# Patient Record
Sex: Female | Born: 1950 | Race: White | Hispanic: No | Smoking: Former smoker
Health system: Southern US, Community
[De-identification: ages and names within clinical notes are randomized; demographics above are authoritative.]

## PROBLEM LIST (undated history)

## (undated) DIAGNOSIS — I5042 Chronic combined systolic (congestive) and diastolic (congestive) heart failure: Secondary | ICD-10-CM

## (undated) DIAGNOSIS — K219 Gastro-esophageal reflux disease without esophagitis: Secondary | ICD-10-CM

## (undated) DIAGNOSIS — M1990 Unspecified osteoarthritis, unspecified site: Secondary | ICD-10-CM

## (undated) DIAGNOSIS — K7460 Unspecified cirrhosis of liver: Secondary | ICD-10-CM

## (undated) DIAGNOSIS — F329 Major depressive disorder, single episode, unspecified: Secondary | ICD-10-CM

## (undated) DIAGNOSIS — F32A Depression, unspecified: Secondary | ICD-10-CM

## (undated) DIAGNOSIS — E669 Obesity, unspecified: Secondary | ICD-10-CM

## (undated) DIAGNOSIS — H35039 Hypertensive retinopathy, unspecified eye: Secondary | ICD-10-CM

## (undated) DIAGNOSIS — I8500 Esophageal varices without bleeding: Secondary | ICD-10-CM

## (undated) DIAGNOSIS — I10 Essential (primary) hypertension: Secondary | ICD-10-CM

## (undated) DIAGNOSIS — E785 Hyperlipidemia, unspecified: Secondary | ICD-10-CM

## (undated) DIAGNOSIS — H269 Unspecified cataract: Secondary | ICD-10-CM

## (undated) DIAGNOSIS — Z8701 Personal history of pneumonia (recurrent): Secondary | ICD-10-CM

## (undated) DIAGNOSIS — I4819 Other persistent atrial fibrillation: Secondary | ICD-10-CM

## (undated) DIAGNOSIS — I2510 Atherosclerotic heart disease of native coronary artery without angina pectoris: Secondary | ICD-10-CM

## (undated) DIAGNOSIS — N183 Chronic kidney disease, stage 3 unspecified: Secondary | ICD-10-CM

## (undated) DIAGNOSIS — D696 Thrombocytopenia, unspecified: Secondary | ICD-10-CM

## (undated) DIAGNOSIS — I4892 Unspecified atrial flutter: Secondary | ICD-10-CM

## (undated) DIAGNOSIS — D61818 Other pancytopenia: Secondary | ICD-10-CM

## (undated) HISTORY — DX: Hypertensive retinopathy, unspecified eye: H35.039

## (undated) HISTORY — PX: ABDOMINAL HYSTERECTOMY: SHX81

## (undated) HISTORY — DX: Essential (primary) hypertension: I10

## (undated) HISTORY — DX: Unspecified cataract: H26.9

## (undated) HISTORY — PX: JOINT REPLACEMENT: SHX530

## (undated) HISTORY — DX: Unspecified atrial flutter: I48.92

## (undated) HISTORY — DX: Personal history of pneumonia (recurrent): Z87.01

## (undated) HISTORY — DX: Hyperlipidemia, unspecified: E78.5

## (undated) HISTORY — DX: Depression, unspecified: F32.A

## (undated) HISTORY — DX: Gastro-esophageal reflux disease without esophagitis: K21.9

## (undated) HISTORY — DX: Unspecified osteoarthritis, unspecified site: M19.90

## (undated) HISTORY — PX: BACK SURGERY: SHX140

## (undated) HISTORY — DX: Obesity, unspecified: E66.9

## (undated) HISTORY — DX: Esophageal varices without bleeding: I85.00

## (undated) HISTORY — DX: Major depressive disorder, single episode, unspecified: F32.9

## (undated) HISTORY — DX: Atherosclerotic heart disease of native coronary artery without angina pectoris: I25.10

## (undated) HISTORY — PX: TONSILLECTOMY: SUR1361

---

## 1997-12-10 ENCOUNTER — Ambulatory Visit (HOSPITAL_COMMUNITY): Admission: RE | Admit: 1997-12-10 | Discharge: 1997-12-10 | Payer: Self-pay | Admitting: Neurosurgery

## 1997-12-19 ENCOUNTER — Ambulatory Visit (HOSPITAL_COMMUNITY): Admission: RE | Admit: 1997-12-19 | Discharge: 1997-12-19 | Payer: Self-pay | Admitting: Neurosurgery

## 1997-12-19 ENCOUNTER — Encounter: Payer: Self-pay | Admitting: Neurosurgery

## 1998-02-13 ENCOUNTER — Encounter: Payer: Self-pay | Admitting: Neurosurgery

## 1998-02-17 ENCOUNTER — Encounter: Payer: Self-pay | Admitting: Neurosurgery

## 1998-02-17 ENCOUNTER — Inpatient Hospital Stay (HOSPITAL_COMMUNITY): Admission: RE | Admit: 1998-02-17 | Discharge: 1998-02-20 | Payer: Self-pay | Admitting: Neurosurgery

## 1998-08-30 ENCOUNTER — Inpatient Hospital Stay (HOSPITAL_COMMUNITY): Admission: AD | Admit: 1998-08-30 | Discharge: 1998-09-03 | Payer: Self-pay | Admitting: *Deleted

## 1998-10-04 ENCOUNTER — Inpatient Hospital Stay (HOSPITAL_COMMUNITY): Admission: EM | Admit: 1998-10-04 | Discharge: 1998-10-08 | Payer: Self-pay | Admitting: Cardiology

## 1998-10-13 ENCOUNTER — Ambulatory Visit (HOSPITAL_COMMUNITY): Admission: RE | Admit: 1998-10-13 | Discharge: 1998-10-13 | Payer: Self-pay | Admitting: Cardiology

## 1998-10-13 ENCOUNTER — Encounter: Payer: Self-pay | Admitting: Cardiology

## 1999-04-01 ENCOUNTER — Encounter: Payer: Self-pay | Admitting: Cardiology

## 1999-04-01 ENCOUNTER — Inpatient Hospital Stay (HOSPITAL_COMMUNITY): Admission: AD | Admit: 1999-04-01 | Discharge: 1999-04-03 | Payer: Self-pay | Admitting: Cardiology

## 1999-07-27 ENCOUNTER — Inpatient Hospital Stay (HOSPITAL_COMMUNITY): Admission: AD | Admit: 1999-07-27 | Discharge: 1999-08-06 | Payer: Self-pay | Admitting: Internal Medicine

## 1999-07-28 ENCOUNTER — Encounter: Payer: Self-pay | Admitting: Internal Medicine

## 1999-07-29 ENCOUNTER — Encounter: Payer: Self-pay | Admitting: Cardiothoracic Surgery

## 1999-07-29 HISTORY — PX: CORONARY ARTERY BYPASS GRAFT: SHX141

## 1999-07-30 ENCOUNTER — Encounter: Payer: Self-pay | Admitting: Cardiothoracic Surgery

## 1999-07-31 ENCOUNTER — Encounter: Payer: Self-pay | Admitting: Internal Medicine

## 1999-08-01 ENCOUNTER — Encounter: Payer: Self-pay | Admitting: Cardiothoracic Surgery

## 1999-08-02 ENCOUNTER — Encounter: Payer: Self-pay | Admitting: Cardiothoracic Surgery

## 1999-08-03 ENCOUNTER — Encounter: Payer: Self-pay | Admitting: Cardiothoracic Surgery

## 2000-01-22 ENCOUNTER — Ambulatory Visit (HOSPITAL_COMMUNITY): Admission: RE | Admit: 2000-01-22 | Discharge: 2000-01-22 | Payer: Self-pay | Admitting: Neurosurgery

## 2000-01-22 ENCOUNTER — Encounter: Payer: Self-pay | Admitting: Neurosurgery

## 2000-05-13 ENCOUNTER — Encounter: Payer: Self-pay | Admitting: Neurosurgery

## 2000-05-13 ENCOUNTER — Ambulatory Visit (HOSPITAL_COMMUNITY): Admission: RE | Admit: 2000-05-13 | Discharge: 2000-05-13 | Payer: Self-pay | Admitting: Neurosurgery

## 2000-08-02 ENCOUNTER — Encounter: Payer: Self-pay | Admitting: Orthopaedic Surgery

## 2000-08-08 ENCOUNTER — Inpatient Hospital Stay (HOSPITAL_COMMUNITY): Admission: RE | Admit: 2000-08-08 | Discharge: 2000-08-12 | Payer: Self-pay | Admitting: Orthopaedic Surgery

## 2000-08-08 ENCOUNTER — Encounter: Payer: Self-pay | Admitting: Orthopaedic Surgery

## 2002-02-28 HISTORY — PX: CARDIAC CATHETERIZATION: SHX172

## 2003-01-03 ENCOUNTER — Ambulatory Visit (HOSPITAL_COMMUNITY): Admission: RE | Admit: 2003-01-03 | Discharge: 2003-01-04 | Payer: Self-pay | Admitting: *Deleted

## 2003-12-31 ENCOUNTER — Ambulatory Visit: Payer: Self-pay | Admitting: *Deleted

## 2004-02-29 HISTORY — PX: CARDIAC CATHETERIZATION: SHX172

## 2004-07-30 ENCOUNTER — Ambulatory Visit: Payer: Self-pay | Admitting: *Deleted

## 2005-02-04 ENCOUNTER — Inpatient Hospital Stay (HOSPITAL_COMMUNITY): Admission: EM | Admit: 2005-02-04 | Discharge: 2005-02-08 | Payer: Self-pay | Admitting: Emergency Medicine

## 2005-02-07 ENCOUNTER — Ambulatory Visit: Payer: Self-pay | Admitting: Cardiology

## 2005-02-28 HISTORY — PX: OTHER SURGICAL HISTORY: SHX169

## 2005-03-09 ENCOUNTER — Ambulatory Visit: Payer: Self-pay | Admitting: Cardiology

## 2005-06-16 ENCOUNTER — Ambulatory Visit: Payer: Self-pay | Admitting: Cardiology

## 2005-06-29 ENCOUNTER — Encounter: Payer: Self-pay | Admitting: Neurosurgery

## 2005-07-27 ENCOUNTER — Ambulatory Visit: Payer: Self-pay

## 2006-02-28 HISTORY — PX: CARDIAC CATHETERIZATION: SHX172

## 2006-04-25 ENCOUNTER — Emergency Department (HOSPITAL_COMMUNITY): Admission: EM | Admit: 2006-04-25 | Discharge: 2006-04-25 | Payer: Self-pay | Admitting: Emergency Medicine

## 2006-06-06 ENCOUNTER — Ambulatory Visit: Payer: Self-pay | Admitting: Cardiology

## 2006-06-06 LAB — CONVERTED CEMR LAB
BUN: 13 mg/dL (ref 6–23)
Basophils Absolute: 0 10*3/uL (ref 0.0–0.1)
Basophils Relative: 0.4 % (ref 0.0–1.0)
CO2: 32 meq/L (ref 19–32)
Calcium: 9.6 mg/dL (ref 8.4–10.5)
Chloride: 106 meq/L (ref 96–112)
Creatinine, Ser: 0.9 mg/dL (ref 0.4–1.2)
Eosinophils Absolute: 0.4 10*3/uL (ref 0.0–0.6)
Eosinophils Relative: 6.3 % — ABNORMAL HIGH (ref 0.0–5.0)
GFR calc Af Amer: 83 mL/min
GFR calc non Af Amer: 69 mL/min
Glucose, Bld: 125 mg/dL — ABNORMAL HIGH (ref 70–99)
HCT: 40.3 % (ref 36.0–46.0)
Hemoglobin: 14.3 g/dL (ref 12.0–15.0)
INR: 1 (ref 0.9–2.0)
Lymphocytes Relative: 29.6 % (ref 12.0–46.0)
MCHC: 35.5 g/dL (ref 30.0–36.0)
MCV: 86.9 fL (ref 78.0–100.0)
Monocytes Absolute: 0.5 10*3/uL (ref 0.2–0.7)
Monocytes Relative: 8.4 % (ref 3.0–11.0)
Neutro Abs: 3.6 10*3/uL (ref 1.4–7.7)
Neutrophils Relative %: 55.3 % (ref 43.0–77.0)
Platelets: 124 10*3/uL — ABNORMAL LOW (ref 150–400)
Potassium: 5.1 meq/L (ref 3.5–5.1)
Prothrombin Time: 11.8 s (ref 10.0–14.0)
RBC: 4.64 M/uL (ref 3.87–5.11)
RDW: 13.8 % (ref 11.5–14.6)
Sodium: 144 meq/L (ref 135–145)
WBC: 6.4 10*3/uL (ref 4.5–10.5)
aPTT: 28.8 s (ref 26.5–36.5)

## 2006-06-08 ENCOUNTER — Inpatient Hospital Stay (HOSPITAL_BASED_OUTPATIENT_CLINIC_OR_DEPARTMENT_OTHER): Admission: RE | Admit: 2006-06-08 | Discharge: 2006-06-08 | Payer: Self-pay | Admitting: Cardiology

## 2006-06-08 ENCOUNTER — Ambulatory Visit: Payer: Self-pay | Admitting: Cardiology

## 2006-06-09 ENCOUNTER — Ambulatory Visit: Payer: Self-pay | Admitting: Cardiology

## 2006-06-20 ENCOUNTER — Ambulatory Visit: Payer: Self-pay

## 2006-06-20 ENCOUNTER — Encounter: Payer: Self-pay | Admitting: Cardiology

## 2006-07-04 ENCOUNTER — Ambulatory Visit: Payer: Self-pay | Admitting: Cardiology

## 2006-08-14 ENCOUNTER — Ambulatory Visit (HOSPITAL_COMMUNITY): Admission: RE | Admit: 2006-08-14 | Discharge: 2006-08-14 | Payer: Self-pay | Admitting: Neurosurgery

## 2006-09-27 ENCOUNTER — Ambulatory Visit: Payer: Self-pay | Admitting: Cardiology

## 2007-01-29 ENCOUNTER — Ambulatory Visit: Payer: Self-pay | Admitting: Cardiology

## 2007-01-29 LAB — CONVERTED CEMR LAB
BUN: 12 mg/dL (ref 6–23)
CO2: 34 meq/L — ABNORMAL HIGH (ref 19–32)
Calcium: 9.7 mg/dL (ref 8.4–10.5)
Chloride: 102 meq/L (ref 96–112)
Creatinine, Ser: 0.8 mg/dL (ref 0.4–1.2)
GFR calc Af Amer: 95 mL/min
GFR calc non Af Amer: 78 mL/min
Glucose, Bld: 93 mg/dL (ref 70–99)
Potassium: 4.5 meq/L (ref 3.5–5.1)
Sodium: 144 meq/L (ref 135–145)

## 2007-08-23 ENCOUNTER — Ambulatory Visit: Payer: Self-pay | Admitting: Cardiology

## 2008-01-30 ENCOUNTER — Ambulatory Visit: Payer: Self-pay | Admitting: Cardiology

## 2008-03-25 ENCOUNTER — Ambulatory Visit (HOSPITAL_COMMUNITY): Admission: RE | Admit: 2008-03-25 | Discharge: 2008-03-25 | Payer: Self-pay | Admitting: Pulmonary Disease

## 2008-04-02 ENCOUNTER — Ambulatory Visit (HOSPITAL_COMMUNITY): Admission: RE | Admit: 2008-04-02 | Discharge: 2008-04-02 | Payer: Self-pay | Admitting: Pulmonary Disease

## 2008-04-24 ENCOUNTER — Ambulatory Visit: Payer: Self-pay | Admitting: Cardiology

## 2008-04-24 ENCOUNTER — Encounter: Payer: Self-pay | Admitting: Cardiology

## 2008-04-25 ENCOUNTER — Inpatient Hospital Stay (HOSPITAL_COMMUNITY): Admission: EM | Admit: 2008-04-25 | Discharge: 2008-04-26 | Payer: Self-pay | Admitting: Emergency Medicine

## 2008-06-04 ENCOUNTER — Encounter: Payer: Self-pay | Admitting: Cardiology

## 2008-06-04 ENCOUNTER — Ambulatory Visit: Payer: Self-pay | Admitting: Cardiology

## 2008-06-04 DIAGNOSIS — I10 Essential (primary) hypertension: Secondary | ICD-10-CM | POA: Insufficient documentation

## 2008-06-04 DIAGNOSIS — E782 Mixed hyperlipidemia: Secondary | ICD-10-CM | POA: Insufficient documentation

## 2008-06-04 DIAGNOSIS — Z951 Presence of aortocoronary bypass graft: Secondary | ICD-10-CM | POA: Insufficient documentation

## 2008-06-09 ENCOUNTER — Inpatient Hospital Stay (HOSPITAL_BASED_OUTPATIENT_CLINIC_OR_DEPARTMENT_OTHER): Admission: RE | Admit: 2008-06-09 | Discharge: 2008-06-09 | Payer: Self-pay | Admitting: Cardiology

## 2008-06-09 ENCOUNTER — Ambulatory Visit: Payer: Self-pay | Admitting: Cardiology

## 2008-06-16 LAB — CONVERTED CEMR LAB
BUN: 9 mg/dL (ref 6–23)
Basophils Absolute: 0 10*3/uL (ref 0.0–0.1)
Basophils Relative: 0 % (ref 0.0–3.0)
CO2: 34 meq/L — ABNORMAL HIGH (ref 19–32)
Calcium: 9.5 mg/dL (ref 8.4–10.5)
Chloride: 107 meq/L (ref 96–112)
Creatinine, Ser: 0.8 mg/dL (ref 0.4–1.2)
Eosinophils Absolute: 0.2 10*3/uL (ref 0.0–0.7)
Eosinophils Relative: 3.1 % (ref 0.0–5.0)
GFR calc non Af Amer: 77.82 mL/min (ref >60)
Glucose, Bld: 106 mg/dL — ABNORMAL HIGH (ref 70–99)
HCT: 40.9 % (ref 36.0–46.0)
Hemoglobin: 13.9 g/dL (ref 12.0–15.0)
INR: 1.1 — ABNORMAL HIGH (ref 0.8–1.0)
Lymphocytes Relative: 33.4 % (ref 12.0–46.0)
Lymphs Abs: 1.9 10*3/uL (ref 0.7–4.0)
MCHC: 34 g/dL (ref 30.0–36.0)
MCV: 88.4 fL (ref 78.0–100.0)
Monocytes Absolute: 0.4 10*3/uL (ref 0.1–1.0)
Monocytes Relative: 7.1 % (ref 3.0–12.0)
Neutro Abs: 3.2 10*3/uL (ref 1.4–7.7)
Neutrophils Relative %: 56.4 % (ref 43.0–77.0)
Platelets: 103 10*3/uL — ABNORMAL LOW (ref 150.0–400.0)
Potassium: 4.9 meq/L (ref 3.5–5.1)
Prothrombin Time: 12 s (ref 10.9–13.3)
RBC: 4.63 M/uL (ref 3.87–5.11)
RDW: 13.3 % (ref 11.5–14.6)
Sodium: 145 meq/L (ref 135–145)
WBC: 5.7 10*3/uL (ref 4.5–10.5)

## 2008-06-23 ENCOUNTER — Ambulatory Visit: Payer: Self-pay | Admitting: Cardiology

## 2008-07-07 ENCOUNTER — Encounter: Payer: Self-pay | Admitting: Cardiology

## 2008-07-07 ENCOUNTER — Ambulatory Visit: Admission: RE | Admit: 2008-07-07 | Discharge: 2008-07-07 | Payer: Self-pay | Admitting: Cardiology

## 2008-07-14 ENCOUNTER — Ambulatory Visit: Payer: Self-pay | Admitting: Cardiology

## 2008-07-14 ENCOUNTER — Encounter (INDEPENDENT_AMBULATORY_CARE_PROVIDER_SITE_OTHER): Payer: Self-pay

## 2008-08-26 ENCOUNTER — Telehealth: Payer: Self-pay | Admitting: Cardiology

## 2008-08-26 ENCOUNTER — Encounter: Payer: Self-pay | Admitting: Cardiology

## 2008-09-04 ENCOUNTER — Ambulatory Visit: Payer: Self-pay | Admitting: Cardiology

## 2008-10-27 ENCOUNTER — Ambulatory Visit: Payer: Self-pay | Admitting: Cardiology

## 2008-10-30 ENCOUNTER — Ambulatory Visit: Payer: Self-pay

## 2008-10-30 ENCOUNTER — Encounter: Payer: Self-pay | Admitting: Cardiology

## 2008-11-03 LAB — CONVERTED CEMR LAB
BUN: 15 mg/dL (ref 6–23)
CO2: 31 meq/L (ref 19–32)
Calcium: 9.1 mg/dL (ref 8.4–10.5)
Chloride: 106 meq/L (ref 96–112)
Creatinine, Ser: 0.8 mg/dL (ref 0.4–1.2)
GFR calc non Af Amer: 77.72 mL/min (ref >60)
Glucose, Bld: 126 mg/dL — ABNORMAL HIGH (ref 70–99)
Potassium: 4.2 meq/L (ref 3.5–5.1)
Sodium: 141 meq/L (ref 135–145)

## 2008-12-27 ENCOUNTER — Encounter: Payer: Self-pay | Admitting: Cardiology

## 2008-12-28 ENCOUNTER — Ambulatory Visit: Payer: Self-pay | Admitting: Cardiology

## 2008-12-29 ENCOUNTER — Encounter: Payer: Self-pay | Admitting: Cardiology

## 2009-01-13 ENCOUNTER — Ambulatory Visit: Payer: Self-pay | Admitting: Cardiology

## 2009-01-27 ENCOUNTER — Ambulatory Visit: Payer: Self-pay | Admitting: Cardiology

## 2009-02-04 LAB — CONVERTED CEMR LAB
BUN: 11 mg/dL (ref 6–23)
CO2: 32 meq/L (ref 19–32)
Calcium: 9.5 mg/dL (ref 8.4–10.5)
Chloride: 102 meq/L (ref 96–112)
Creatinine, Ser: 0.9 mg/dL (ref 0.4–1.2)
GFR calc non Af Amer: 67.78 mL/min (ref >60)
Glucose, Bld: 183 mg/dL — ABNORMAL HIGH (ref 70–99)
Potassium: 4 meq/L (ref 3.5–5.1)
Sodium: 141 meq/L (ref 135–145)

## 2009-03-16 ENCOUNTER — Ambulatory Visit: Payer: Self-pay | Admitting: Cardiology

## 2009-05-06 ENCOUNTER — Encounter: Payer: Self-pay | Admitting: Cardiology

## 2009-05-11 ENCOUNTER — Ambulatory Visit (HOSPITAL_COMMUNITY): Admission: RE | Admit: 2009-05-11 | Discharge: 2009-05-11 | Payer: Self-pay | Admitting: Pulmonary Disease

## 2009-05-19 ENCOUNTER — Ambulatory Visit: Payer: Self-pay | Admitting: Cardiology

## 2009-08-21 ENCOUNTER — Ambulatory Visit: Payer: Self-pay | Admitting: Cardiology

## 2009-08-21 DIAGNOSIS — I2510 Atherosclerotic heart disease of native coronary artery without angina pectoris: Secondary | ICD-10-CM

## 2009-08-21 DIAGNOSIS — I25119 Atherosclerotic heart disease of native coronary artery with unspecified angina pectoris: Secondary | ICD-10-CM | POA: Insufficient documentation

## 2009-09-07 ENCOUNTER — Ambulatory Visit (HOSPITAL_COMMUNITY): Admission: RE | Admit: 2009-09-07 | Discharge: 2009-09-07 | Payer: Self-pay | Admitting: Orthopaedic Surgery

## 2009-12-29 ENCOUNTER — Ambulatory Visit: Payer: Self-pay | Admitting: Cardiology

## 2009-12-29 ENCOUNTER — Encounter: Payer: Self-pay | Admitting: Cardiology

## 2010-03-30 NOTE — Letter (Signed)
Summary: [facility]   Imported By: Sallee Provencal 02/27/2009 10:50:31  _____________________________________________________________________  External Attachment:    Type:   Image     Comment:   External Document  Appended Document: [facility] reviewed with patient at time of discharge

## 2010-03-30 NOTE — Progress Notes (Signed)
Summary: call to discuss recent episode  Phone Note From Other Clinic Call back at [phone]   Caller: Provider  amy [facility]d [facility] Summary of Call: please call pa to obtain information regarding an episode patient experienced recently.   Initial call taken by: Alexander Bergeron,  August 26, 2008 10:04 AM  Follow-up for Phone Call        Spoke with PA about pt. The pt came into their office today to be seen for a knot on her right lower leg.  The pt did tell the PA about an episode she had on Sunday.  The pt had been out in the sun and developed SOB and left arm pain.  The pt went inside and developed nausea and vomiting. The pt did not use NTG for this episode.  EKG was obtained today with  no acute changes.  Per the PA the pt has felt fine since this episode.  Pt did not feel like this episode was heart related but related to being overheated.  The PA told the pt to call our office or go to the ER if she has any further symptoms.  Pt does have an appt with Dr Lia Foyer on July 8th.  PA will fax records to our office. Follow-up by: Theodosia Quay, RN, BSN,  August 26, 2008 11:45 AM      Appended Document: call to discuss recent episode Agree with plan of action.  Will see patient in followup next week.

## 2010-03-30 NOTE — Assessment & Plan Note (Signed)
Summary: f2w   Visit Type:  2 weeks follow up Primary Provider:  Luan Pulling  CC:  Sob- Low blood pressure last friday night 88/45.  History of Present Illness: Recently hospitalized at [facility] after an episode of chest pain in [place].  Workup was negative and she and I have extensively reviewed her records.  Enzymes were all negative, gated spect revealed EF of 49% and apical and inferolateral defects with scar/ischemia.  We reviewed in the office today all of her films from cath procedures last year.  Similar nuc findings were noted previously here in 2004,   Ef at that time was 47%.  Since discharge from the hospital she has done reasonably well.  We discussed many things today.  Current Medications (verified): 1)  Diovan 160 Mg Tabs (Valsartan) .... Take One Tablet By Mouth Every Morning and One-Half Tablet Every Evening 2)  Furosemide 40 Mg Tabs (Furosemide) .... Take One Tablet By Mouth Daily. 3)  Fluoxetine Hcl 20 Mg  Tabs (Fluoxetine Hcl) .... Take 1 Tab By Mouth Daily 4)  Premarin 0.625 Mg Tabs (Estrogens Conjugated) .... One By Mouth Daily 5)  Nexium 40 Mg Cpdr (Esomeprazole Magnesium) .... Take 1 Tab Each Morning 6)  Zetia 10 Mg Tabs (Ezetimibe) .... Take One Tablet By Mouth Daily. 7)  Aspirin 81 Mg Tbec (Aspirin) .... Take One Tablet By Mouth Daily 8)  Xanax 0.5 Mg Tabs (Alprazolam) .... Take 1 As Needed 9)  Hydrocodone-Acetaminophen 10-325 Mg Tabs (Hydrocodone-Acetaminophen) .... Take As Needed 10)  Calcium Carbonate-Vitamin D 600-400 Mg-Unit  Tabs (Calcium Carbonate-Vitamin D) .... Take 1 Tablet By Mouth Three Times A Day 11)  Plavix 75 Mg Tabs (Clopidogrel Bisulfate) .... Take One Tablet By Mouth Daily 12)  Lipitor 20 Mg Tabs (Atorvastatin Calcium) .... Take One Tablet By Mouth Daily. 13)  Fish Oil 1000 Mg Caps (Omega-3 Fatty Acids) .... Take 1 Capsule By Mouth Two Times A Day 14)  Metoprolol Succinate 25 Mg Xr24h-Tab (Metoprolol Succinate) .... Take One Tablet By Mouth  Daily 15)  Isosorbide Mononitrate Cr 30 Mg Xr24h-Tab (Isosorbide Mononitrate) .... Take One Tablet By Mouth Daily  Allergies: 1)  ! Penicillin 2)  ! Ceclor 3)  ! Keflex  Vital Signs:  Patient profile:   60 year old female Height:      62 inches Weight:      220 pounds BMI:     40.38 Pulse rate:   65 / minute Pulse rhythm:   regular Resp:     20 per minute BP sitting:   124 / 68  (left arm) Cuff size:   large  Vitals Entered By: Sidney Ace (January 27, 2009 4:01 PM)  Physical Exam  General:  Well developed, well nourished, in no acute distress. Head:  normocephalic and atraumatic Lungs:  Clear bilaterally to auscultation and percussion. Heart:  PMI non displaced.  Normal S1 and S2.  Soft apical murmur. Extremities:  No clubbing or cyanosis.  No edema.   EKG  Procedure date:  01/13/2009  Findings:      NSR.  Prolonged QTc.  Nonspecific ST and T wave abnormality.  Echocardiogram  Procedure date:  10/30/2008  Findings:      Study Conclusions            1. Left ventricle: There is a kinesis of the inferior wall. The        cavity size was normal. Wall thickness was increased in a pattern        of  mild LVH. Systolic function was moderately reduced. The        estimated ejection fraction was in the range of 35% to 40%.     2. Mitral valve: Mildly calcified annulus.     3. Left atrium: The atrium was mildly dilated.     4. Right atrium: The atrium was mildly dilated.     5. Tricuspid valve: Mild-moderate regurgitation.     6. Pulmonary arteries: PA peak pressure: 76mm Hg (S).  Cardiac Cath  Procedure date:  06/09/2008  Findings:      CONCLUSIONS: 1. Moderately reduced overall left ventricular systolic function with     an inferobasal wall motion abnormality. 2. Continued patency of the combined saphenous vein graft internal     mammary to both the diagonal and the distal circumflex. 3. Continued patency of the stents to the circumflex system. 4.  Continued patency of the vein graft to the acute marginal branch. 5. Moderate pulmonary hypertension, with some elevation in pulmonary     vascular resistance   Cardiac Cath  Procedure date:  06/09/2008  Findings:      HEMODYNAMIC DATA: 1. RA 16. 2. RV 64/19. 3. Pulmonary artery 64/30, mean 42. 4. Pulmonary capillary wedge 26. 5. Aortic 165/76, mean 113. 6. LV pressure 173/32. 7. No aortic to LV gradient. 8. No significant wedge to LV gradient. 9. Appropriate RV, LV relationship. 10.Thermodilution cardiac output 4.6 L/min. 11.Thermodilution cardiac index 2.3 L/min/m2. 12.Fick cardiac output 5.5 L/min. 13.Fick cardiac index 2.8 L/min/m2. 14.RA saturation 67%. 15.Pulmonary artery saturation 71%. 16.Aortic saturation 95%.    CXR  Procedure date:  04/25/2008  Findings:       Findings: There is cardiomegaly with the patient status post CABG.   Coronary artery stent is also visualized.  There is linear   atelectasis in both the right and left lungs.  Lungs otherwise   clear.  No effusion.    IMPRESSION:   Cardiomegaly and mild, scattered atelectasis.    Read By:  Ramond Dial,  M.D.  Impression & Recommendations:  Problem # 1:  CORONARY ARTERY BYPASS GRAFT, HX OF (ICD-V45.81) Currently stable with negative recent admission.  Need to continue to monitor. See reports of cath and CABG. Last study revealed overall relatively stable status, but there is evidence of pulmonary hypertension. Orders: TLB-BMP (Basic Metabolic Panel-BMET) (99991111)  Problem # 2:  HYPERTENSION NEC (ICD-997.91) Appears to be reasonably well controlled at this point in time. Orders: TLB-BMP (Basic Metabolic Panel-BMET) (99991111)  Problem # 3:  MIXED HYPERLIPIDEMIA (ICD-272.2) Followed by primary physicians.  On medical therpay. Her updated medication list for this problem includes:    Zetia 10 Mg Tabs (Ezetimibe) .[name]... Take one tablet by mouth daily.    Lipitor 20 Mg Tabs  (Atorvastatin calcium) .[name]... Take one tablet by mouth daily.  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  Could choose an alternative beta blocker, but also has some component which could be made worse by non selective beta blockade.   Therefore, continue current regimen. Her updated medication list for this problem includes:    Diovan 160 Mg Tabs (Valsartan) .[name]... Take one tablet by mouth every morning and one-half tablet every evening    Furosemide 40 Mg Tabs (Furosemide) .[name]... Take one tablet by mouth daily.    Aspirin 81 Mg Tbec (Aspirin) .[name]... Take one tablet by mouth daily    Plavix 75 Mg Tabs (Clopidogrel bisulfate) .Marland KitchenMarland KitchenMarland KitchenMarland Kitchen  Take one tablet by mouth daily    Metoprolol Succinate 25 Mg Xr24h-tab (Metoprolol succinate) .[name]... Take one tablet by mouth daily    Isosorbide Mononitrate Cr 30 Mg Xr24h-tab (Isosorbide mononitrate) .[name]... Take one tablet by mouth daily  Orders: TLB-BMP (Basic Metabolic Panel-BMET) (99991111)  Patient Instructions: 1)  Your physician recommends that you have lab work today: BMP 2)  Your physician recommends that you schedule a follow-up appointment in: 2 MONTHS

## 2010-03-30 NOTE — Assessment & Plan Note (Signed)
Summary: [facility]   CC:  Post Hospital;SOB;Edema;.  History of Present Illness: Feels like she is swelling alot, and also gaining weight.  Not sure why she is doing.  Hospitalized in  February and repeat echo showed reduced overall function.  Still gaining some weight.  Absolutely denies and chest pain.  Never had chest pain with heart attack to begin with.  Current Medications (verified): 1)  Atenolol 25 Mg Tabs (Atenolol) .... Take One Tablet By Mouth Daily 2)  Diovan 160 Mg Tabs (Valsartan) .... Take One Tablet By Mouth Daily 3)  Furosemide 40 Mg Tabs (Furosemide) .... Take One Tablet By Mouth Daily. 4)  Fluoxetine Hcl 20 Mg  Tabs (Fluoxetine Hcl) .... Take 1 Tab By Mouth Daily 5)  Premarin 0.625 Mg Tabs (Estrogens Conjugated) .... One By Mouth Daily 6)  Nexium 40 Mg Cpdr (Esomeprazole Magnesium) .... Take 1 Tab Each Morning 7)  Zetia 10 Mg Tabs (Ezetimibe) .... Take One Tablet By Mouth Daily. 8)  Lipitor 40 Mg Tabs (Atorvastatin Calcium) .... Take One Tablet By Mouth Daily. 9)  Aspirin 81 Mg Tbec (Aspirin) .... Take One Tablet By Mouth Daily 10)  Fish Oil   Oil (Fish Oil) .... Take 2 Once Daily 11)  Xanax 0.5 Mg Tabs (Alprazolam) .... Take 1 As Needed 12)  Hydrocodone-Acetaminophen 10-325 Mg Tabs (Hydrocodone-Acetaminophen) .... Take As Needed  Vital Signs:  Patient profile:   60 year old female Height:      62 inches Weight:      222 pounds BMI:     40.75 Pulse rate:   72 / minute BP sitting:   157 / 86  (left arm)  Vitals Entered By: Eliezer Lofts, EMT-P (June 04, 2008 11:16 AM)  Physical Exam  General:  Well developed, well nourished, in no acute distress. Neck:  Mild increase in JVP Lungs:  Clear bilaterally to auscultation and percussion. Heart:  Non-displaced PMI, chest non-tender; regular rate and rhythm, S1, S2 without murmurs, rubs or gallops. Carotid upstroke normal, no bruit. Normal abdominal aortic size, no bruits. Femorals normal pulses, no bruits.  Pedals normal pulses. No edema, no varicosities.   EKG  Procedure date:  06/04/2008  Findings:      Normal rhythm.  Prolonged QT.  Impression & Recommendations:  Problem # 1:  CORONARY ARTERY BYPASS GRAFT, HX OF (ICD-V45.81) Reduced overall LV from prior cath in 2008.  Would suggest repeat R and L heart cath to assess.  Problem # 2:  MIXED HYPERLIPIDEMIA (ICD-272.2)  Her updated medication list for this problem includes:    Zetia 10 Mg Tabs (Ezetimibe) .[name]... Take one tablet by mouth daily.    Lipitor 40 Mg Tabs (Atorvastatin calcium) .[name]... Take one tablet by mouth daily. Followup with Dr. Rea College  Patient Instructions: 1)  Your physician recommends that you continue on your current medications as directed. Please refer to the Current Medication list given to you today. 2)  Your physician has requested that you have a cardiac catheterization.  Cardiac catheterization is used to diagnose and/or treat various heart conditions. Doctors may recommend this procedure for a number of different reasons. The most common reason is to evaluate chest pain. Chest pain can be a symptom of coronary artery disease (CAD), and cardiac catheterization can show whether plaque is narrowing or blocking your heart's arteries. This procedure is also used to evaluate the valves, as well as measure the blood flow and oxygen levels in different parts of your heart.  For further  information please visit [email or web address].  Please follow instruction sheet, as given. (right and left heart catheterization) Prescriptions: FLUOXETINE HCL 20 MG  TABS (FLUOXETINE HCL) Take 1 tab by mouth daily  #90 x 3   Entered by:   Eliezer Lofts, EMT-P   Authorized by:   Wadie Lessen, MD, [facility]   Signed by:   Eliezer Lofts, EMT-P on 06/04/2008   Method used:   Historical   RxIDNL:[id number]  Rx not given for fluoxetine this is incorrect in the document. Theodosia Quay, RN

## 2010-03-30 NOTE — Assessment & Plan Note (Signed)
Summary: f4w/2:00/lwb   Primary Provider:  Luan Pulling  CC:  1 month ROV.  History of Present Illness: Overall about the same.  Feet are swelling some.  Had sleep study.  Went to see Dr. Luan Pulling because of bronchitis.  They did not tell her anything.  Did not come in during the night, and coughed much of the night.  Does not take BP very often.  Pt does not watch salt very carefully.  Current Medications (verified): 1)  Atenolol 25 Mg Tabs (Atenolol) .... Take One Tablet By Mouth Daily 2)  Diovan 160 Mg Tabs (Valsartan) .... Take One Tablet By Mouth Daily 3)  Furosemide 40 Mg Tabs (Furosemide) .... Take One Tablet By Mouth Daily. 4)  Fluoxetine Hcl 20 Mg  Tabs (Fluoxetine Hcl) .... Take 1 Tab By Mouth Daily 5)  Premarin 0.625 Mg Tabs (Estrogens Conjugated) .... One By Mouth Daily 6)  Nexium 40 Mg Cpdr (Esomeprazole Magnesium) .... Take 1 Tab Each Morning 7)  Zetia 10 Mg Tabs (Ezetimibe) .... Take One Tablet By Mouth Daily. 8)  Lipitor 40 Mg Tabs (Atorvastatin Calcium) .... Take One Tablet By Mouth Daily. 9)  Aspirin 81 Mg Tbec (Aspirin) .... Take One Tablet By Mouth Daily 10)  Fish Oil   Oil (Fish Oil) .... Take 2 Once Daily 11)  Xanax 0.5 Mg Tabs (Alprazolam) .... Take 1 As Needed 12)  Hydrocodone-Acetaminophen 10-325 Mg Tabs (Hydrocodone-Acetaminophen) .... Take As Needed 13)  Calcium Carbonate-Vitamin D 600-400 Mg-Unit  Tabs (Calcium Carbonate-Vitamin D) .... Take 1 Tablet By Mouth Three Times A Day  Allergies: 1)  ! Penicillin 2)  ! Ceclor 3)  ! Keflex  Vital Signs:  Patient profile:   60 year old female Height:      62 inches Weight:      215 pounds BMI:     39.47 Pulse rate:   55 / minute BP sitting:   144 / 71  (left arm)  Vitals Entered By: Eliezer Lofts, EMT-P (Jul 14, 2008 2:02 PM)   Echocardiogram  Procedure date:  04/24/2008  Findings:      SUMMARY   -  Overall left ventricular systolic function was moderately to         markedly decreased. Left ventricular  ejection fraction was         estimated , range being 30 % to 35 %. There was moderate         diffuse left ventricular hypokinesis. Findings were         suggestive of akinesis of the inferoposterior wall.   -  There was mild mitral annular calcification.   -  The left atrium was mildly dilated.   -  The right atrium was mildly dilated.    IMPRESSIONS   -  Extremely limited; LV function difficult to quantitate; suggest         TEE or MUGA if clinically indicated.  Impression & Recommendations:  Problem # 1:  CORONARY ARTERY BYPASS GRAFT, HX OF (ICD-V45.81) Currently stable on regimen.  Continue same regimen.  Problem # 2:  HYPERTENSION NEC (ICD-997.91) watch salt in diet.  She does not need information.  She resalts most everything.  Strongly encouraged to reduce the amount in her diet.  Even likes salt with cantaloupe.  Reduce EF with Na load, elevated Right heart pressures.  Problem # 3:  MIXED HYPERLIPIDEMIA (ICD-272.2) Continue with Dr. Luan Pulling. Her updated medication list for this problem includes:    Zetia 10 Mg Tabs (Ezetimibe) .Marland KitchenMarland KitchenMarland KitchenMarland Kitchen  Take one tablet by mouth daily.    Lipitor 40 Mg Tabs (Atorvastatin calcium) .[name]... Take one tablet by mouth daily.  Patient Instructions: 1)  Call if no information from sleep study. 2)   Make sodium, BP, and weight chart. 3)  Your physician recommends that you schedule a follow-up appointment in: 6 weeks.  Appended Document: f4w/2:00/lwb ECG shows sinus bradycardia.  borderline LVH.[name]  Reviewed with patient.
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
## 2010-03-30 NOTE — Assessment & Plan Note (Signed)
Summary: f6w   Visit Type:  Follow-up Primary Provider:  hawkins   History of Present Illness: Feeling alot better.  No cough or significant shortness of breath.  Still does not sleep well at night.  Doing well overall.  Current Medications (verified): 1)  Atenolol 25 Mg Tabs (Atenolol) .... Take One Tablet By Mouth Daily 2)  Diovan 160 Mg Tabs (Valsartan) .... Take One Tablet By Mouth Daily 3)  Furosemide 40 Mg Tabs (Furosemide) .... Take One Tablet By Mouth Daily. 4)  Fluoxetine Hcl 20 Mg  Tabs (Fluoxetine Hcl) .... Take 1 Tab By Mouth Daily 5)  Premarin 0.625 Mg Tabs (Estrogens Conjugated) .... One By Mouth Daily 6)  Nexium 40 Mg Cpdr (Esomeprazole Magnesium) .... Take 1 Tab Each Morning 7)  Zetia 10 Mg Tabs (Ezetimibe) .... Take One Tablet By Mouth Daily. 8)  Lipitor 40 Mg Tabs (Atorvastatin Calcium) .... Take One Tablet By Mouth Daily. 9)  Aspirin 81 Mg Tbec (Aspirin) .... Take One Tablet By Mouth Daily 10)  Fish Oil   Oil (Fish Oil) .... Take 2 Once Daily 11)  Xanax 0.5 Mg Tabs (Alprazolam) .... Take 1 As Needed 12)  Hydrocodone-Acetaminophen 10-325 Mg Tabs (Hydrocodone-Acetaminophen) .... Take As Needed 13)  Calcium Carbonate-Vitamin D 600-400 Mg-Unit  Tabs (Calcium Carbonate-Vitamin D) .... Take 1 Tablet By Mouth Three Times A Day 14)  Plavix 75 Mg Tabs (Clopidogrel Bisulfate) .... Take One Tablet By Mouth Daily  Allergies (verified): 1)  ! Penicillin 2)  ! Ceclor 3)  ! Keflex  Vital Signs:  Patient profile:   60 year old female Height:      62 inches Weight:      215 pounds BMI:     39.47 Pulse rate:   55 / minute BP sitting:   130 / 84  (left arm)  Vitals Entered By: Margaretmary Bayley CMA (October 27, 2008 9:21 AM)  Physical Exam  General:  Well developed, well nourished, in no acute distress. Head:  normocephalic and atraumatic Lungs:  Clear bilaterally to auscultation and percussion. Heart:  No definite murmur.  normal S1 and S2. Abdomen:  Bowel sounds positive;  abdomen soft and non-tender without masses, organomegaly, or hernias noted. No hepatosplenomegaly. Extremities:  No clubbing or cyanosis.  No edema   Impression & Recommendations:  Problem # 1:  COMBINED HEART FAILURE, CHRONIC (ICD-428.42) Elevated wedge at cath with modes pulm HTN.  Sleep study negative.  EF 30-35% by echo.  Will recheck now and reassess.  CLass II symptoms at most, and tolerating meds.  We have discussed beta blocker choice and could alter. Will wait for echo results.  Check BMET Her updated medication list for this problem includes:    Atenolol 25 Mg Tabs (Atenolol) .[name]... Take one tablet by mouth daily    Diovan 160 Mg Tabs (Valsartan) .[name]... Take one tablet by mouth daily    Furosemide 40 Mg Tabs (Furosemide) .[name]... Take one tablet by mouth daily.    Aspirin 81 Mg Tbec (Aspirin) .[name]... Take one tablet by mouth daily    Plavix 75 Mg Tabs (Clopidogrel bisulfate) .[name]... Take one tablet by mouth daily  Problem # 2:  HYPERTENSION NEC (ICD-997.91) Bp controlled. Orders: Echocardiogram (Echo) TLB-BMP (Basic Metabolic Panel-BMET) ([id number])  Problem # 3:  CORONARY ARTERY BYPASS GRAFT, HX OF (ICD-V45.81) Stable at present. Orders: Echocardiogram (Echo) TLB-BMP (Basic Metabolic Panel-BMET) ([id number])  Patient Instructions: 1)  Your physician recommends that you schedule a follow-up appointment in: 3 MONTHS 2)  Your  physician recommends that you have lab work today: BMP 3)  Your physician has requested that you have an echocardiogram.  Echocardiography is a painless test that uses sound waves to create images of your heart. It provides your doctor with information about the size and shape of your heart and how well your heart's chambers and valves are working.  This procedure takes approximately one hour. There are no restrictions for this procedure.

## 2010-03-30 NOTE — Assessment & Plan Note (Signed)
Summary: eph   Visit Type:  Follow-up  CC:  Post-Hospital.  History of Present Illness: Patient in today for review of cath data.  Coronary anatomy was stable, but there was pulm hypertension, question related to sleep apnea.  Suspect she needs a sleep study.    Current Medications (verified): 1)  Atenolol 25 Mg Tabs (Atenolol) .... Take One Tablet By Mouth Daily 2)  Diovan 160 Mg Tabs (Valsartan) .... Take One Tablet By Mouth Daily 3)  Furosemide 40 Mg Tabs (Furosemide) .... Take One Tablet By Mouth Daily. 4)  Fluoxetine Hcl 20 Mg  Tabs (Fluoxetine Hcl) .... Take 1 Tab By Mouth Daily 5)  Premarin 0.625 Mg Tabs (Estrogens Conjugated) .... One By Mouth Daily 6)  Nexium 40 Mg Cpdr (Esomeprazole Magnesium) .... Take 1 Tab Each Morning 7)  Zetia 10 Mg Tabs (Ezetimibe) .... Take One Tablet By Mouth Daily. 8)  Lipitor 40 Mg Tabs (Atorvastatin Calcium) .... Take One Tablet By Mouth Daily. 9)  Aspirin 81 Mg Tbec (Aspirin) .... Take One Tablet By Mouth Daily 10)  Fish Oil   Oil (Fish Oil) .... Take 2 Once Daily 11)  Xanax 0.5 Mg Tabs (Alprazolam) .... Take 1 As Needed 12)  Hydrocodone-Acetaminophen 10-325 Mg Tabs (Hydrocodone-Acetaminophen) .... Take As Needed 13)  Calcium Carbonate-Vitamin D 600-400 Mg-Unit  Tabs (Calcium Carbonate-Vitamin D) .... Take 1 Tablet By Mouth Three Times A Day  Allergies (verified): 1)  ! Penicillin 2)  ! Ceclor 3)  ! Keflex  Vital Signs:  Patient profile:   60 year old female Height:      62 inches Weight:      221 pounds BMI:     40.57 Pulse rate:   72 / minute Pulse rhythm:   regular Resp:     18 per minute BP sitting:   142 / 80  (right arm) Cuff size:   large  Vitals Entered By: Sidney Ace (June 23, 2008 4:22 PM)  Physical Exam  General:  Well developed, well nourished, in no acute distress. Lungs:  Clear bilaterally to auscultation and percussion. Heart:  Non-displaced PMI, chest non-tender; regular rate and rhythm, S1, S2 without  murmurs, rubs or gallops. Carotid upstroke normal, no bruit. Normal abdominal aortic size, no bruits. Femorals normal pulses, no bruits. Pedals normal pulses. No edema, no varicosities. Extremities:  Groin stable.   Cardiac Cath  Procedure date:  06/09/2008  Findings:       CONCLUSIONS:   1. Moderately reduced overall left ventricular systolic function with       an inferobasal wall motion abnormality.   2. Continued patency of the combined saphenous vein graft internal       mammary to both the diagonal and the distal circumflex.   3. Continued patency of the stents to the circumflex system.   4. Continued patency of the vein graft to the acute marginal branch.   5. Moderate pulmonary hypertension, with some elevation in pulmonary       vascular resistance      DISPOSITION:  The etiology of this is uncertain.  She does not have a   history suggestive of pulmonary emboli.  Importantly, the patient does   have moderate obesity, and she snores at night.  Her episode that led to   hospitalization   did occur at night and her cousin tells me that she has been sleeping   poorly.  We would be concerned about the possibility of sleep apnea, and   sleep study  would certainly be recommended.  Weight loss was recommended   as well.  I will have the patient back to the office and discuss the   findings.               Loretha Brasil. Lia Foyer, MD, [facility]   Electronically Signed   Impression & Recommendations:  Problem # 1:  CORONARY ARTERY BYPASS GRAFT, HX OF (ICD-V45.81) stable status of grafts, and native anatomy.  Pulm hypertension, ? related to sleep apnea.  Needs sleep study  Patient Instructions: 1)  Your physician recommends that you schedule a follow-up appointment in: 4 weeks. 2)  Followup with Dr. Luan Pulling.

## 2010-03-30 NOTE — Letter (Signed)
Summary: Handout Printed  Printed Handout:  - *Patient Instructions-Card

## 2010-03-30 NOTE — Assessment & Plan Note (Signed)
Summary: f24m   Visit Type:  4 months follow up Primary Provider:  Luan Pulling  CC:  No cardiac complaints.  History of Present Illness: Overall she is doing well.  She is exercising regularly.  Walks on treadmill and does Zumba.  Of note, she has gotten off her weight watchers a bit recently.  No chest pain.  We had a long discussion [id number] and Nexium today.  She has been tried on three others, and cannot tolerate, and does have bad indigestion.  Current Medications (verified): 1)  Diovan 160 Mg Tabs (Valsartan) .... Take One Tablet By Mouth Every Morning and One-Half Tablet Every Evening 2)  Furosemide 40 Mg Tabs (Furosemide) .... Take One Tablet By Mouth Daily. 3)  Fluoxetine Hcl 20 Mg  Tabs (Fluoxetine Hcl) .... Take 1 Tab By Mouth Daily 4)  Premarin 0.625 Mg Tabs (Estrogens Conjugated) .... One By Mouth Daily 5)  Nexium 40 Mg Cpdr (Esomeprazole Magnesium) .... Take 1 Tab Each Morning 6)  Zetia 10 Mg Tabs (Ezetimibe) .... Take One Tablet By Mouth Daily. 7)  Aspirin 81 Mg Tbec (Aspirin) .... Take One Tablet By Mouth Daily 8)  Xanax 0.5 Mg Tabs (Alprazolam) .... Take 1 As Needed 9)  Hydrocodone-Acetaminophen 10-325 Mg Tabs (Hydrocodone-Acetaminophen) .... Take As Needed 10)  Calcium Carbonate-Vitamin D 600-400 Mg-Unit  Tabs (Calcium Carbonate-Vitamin D) .... Take 1 Tablet By Mouth Three Times A Day 11)  Plavix 75 Mg Tabs (Clopidogrel Bisulfate) .... Take One Tablet By Mouth Daily 12)  Lipitor 20 Mg Tabs (Atorvastatin Calcium) .... Take One Tablet By Mouth Daily. 13)  Fish Oil 1000 Mg Caps (Omega-3 Fatty Acids) .... Take 1 Capsule By Mouth Two Times A Day 14)  Metoprolol Succinate 25 Mg Xr24h-Tab (Metoprolol Succinate) .... Take One Tablet By Mouth Daily 15)  Isosorbide Mononitrate Cr 30 Mg Xr24h-Tab (Isosorbide Mononitrate) .... Take One Tablet By Mouth Daily  Allergies: 1)  ! Penicillin 2)  ! Ceclor 3)  ! Keflex  Past History:  Past Medical History: Last updated:  05/29/2008 PROBLEMS:   1. Tachypalpitations.   2. Coronary artery disease.       a.     Status post percutaneous coronary intervention and bare-        metal stenting of the left circumflex in July 2000.       b.     April 02, 1999, cardiac catheterization revealing an 80%        in-stent restenosis within the left circumflex and subsequent        percutaneous transluminal coronary angioplasty and placement of a        3.0 NIR bare metal stent proximal to previous stent.       c.     Jul 29, 1999, coronary artery bypass graft x3 with a vein        graft to the first diagonal, vein graft to the right coronary        artery, and a free left internal mammary artery to the obtuse        marginal.       d.     January 03, 2003, cardiac catheterization/percutaneous        coronary intervention, left internal mammary artery to the obtuse        marginal was found to be small and thread like.  The two vein        grafts were patent.  The left circumflex had 90% in-stent  restenosis and cutting balloon angioplasty was performed followed        by placement of a 3.0 x 32 mm Taxus drug-eluting stent.       e.     February 07, 2005, cardiac catheterization/percutaneous        coronary intervention.  There was in-stent restenosis in the left        circumflex and this was treated with cutting balloon angioplasty.       f.     June 08, 2006, cardiac catheterization, vein graft to the        right coronary artery was normal.  Vein graft to the first        diagonal normal, free left internal mammary artery to the obtuse        marginal was patent, although small, left circumflex had 40% in-        stent restenosis, ejection fraction 40-45%.  The patient was        medically managed.   3. Hypertension.   4. Hyperlipidemia.   5. Obesity.   6. Gastroesophageal reflux disease/peptic ulcer disease.   7. Remote tobacco abuse.   8. Depression.   9. Osteoarthritis.   10.Status post  hysterectomy.   11.History of pneumonia in December 2009.   Past Surgical History: Last updated: 2008-06-16 Hysterectomy  Family History: Last updated: 06-16-2008 FAMILY HISTORY:  Mother died in her sleep at age 85, although she had an   MI in lifetime, father died of an MI at 67.  She has a sister with   history of CAD and CABG.  She has a brother who is alive and well.      Social History: Last updated: 06/16/08  SOCIAL HISTORY:  She lives in [place] by herself.  She is on disability.   She has a 7- to 8-pack-year history of tobacco abuse, quitting about 15   years ago.  She denies alcohol or drugs.  She walks on a treadmill, 15   to 20 minutes a day, but has only been doing this for a couple of days.      Vital Signs:  Patient profile:   60 year old female Height:      62 inches Weight:      205 pounds BMI:     37.63 Pulse rate:   58 / minute Pulse rhythm:   regular Resp:     18 per minute BP sitting:   118 / 68  (left arm) Cuff size:   large  Vitals Entered By: Sidney Ace (December 29, 2009 3:17 PM)  Physical Exam  General:  Well developed, well nourished, in no acute distress. Head:  normocephalic and atraumatic Eyes:  PERRLA/EOM intact; conjunctiva and lids normal. Lungs:  Clear bilaterally to auscultation and percussion. Heart:  PMI non displaced.  Normal S1 and s2.  Apical murmur. Msk:  Back normal, normal gait. Muscle strength and tone normal. Pulses:  pulses normal in all 4 extremities Extremities:  No clubbing or cyanosis.   EKG  Procedure date:  12/29/2009  Findings:      SB.  non specific T abnormality.  Impression & Recommendations:  Problem # 1:  CORONARY ARTERY BYPASS GRAFT, HX OF (ICD-V45.81) stable.  No recurrent symptoms at present.  Continue meds.  Problem # 2:  COMBINED HEART FAILURE, CHRONIC (ICD-428.42) She is currently stable.  Has some MR.  See last echo.  Tolerates medications well.  Return in six months for reevaluation. Her  updated medication list for this problem includes:    Diovan 160 Mg Tabs (Valsartan) .[name]... Take one tablet by mouth every morning and one-half tablet every evening    Furosemide 40 Mg Tabs (Furosemide) .[name]... Take one tablet by mouth daily.    Aspirin 81 Mg Tbec (Aspirin) .[name]... Take one tablet by mouth daily    Plavix 75 Mg Tabs (Clopidogrel bisulfate) .[name]... Take one tablet by mouth daily    Metoprolol Succinate 25 Mg Xr24h-tab (Metoprolol succinate) .[name]... Take one tablet by mouth daily    Isosorbide Mononitrate Cr 30 Mg Xr24h-tab (Isosorbide mononitrate) .[name]... Take one tablet by mouth daily  Problem # 3:  MIXED HYPERLIPIDEMIA (ICD-272.2) Followed by Dr. Quintin Alto. Her updated medication list for this problem includes:    Zetia 10 Mg Tabs (Ezetimibe) .[name]... Take one tablet by mouth daily.    Lipitor 20 Mg Tabs (Atorvastatin calcium) .[name]... Take one tablet by mouth daily.  Other Orders: EKG w/ Interpretation (93000)  Patient Instructions: 1)  Your physician recommends that you continue on your current medications as directed. Please refer to the Current Medication list given to you today. 2)  Your physician wants you to follow-up in: 6 MONTHS.  You will receive a reminder letter in the mail two months in advance. If you don't receive a letter, please call our office to schedule the follow-up appointment.

## 2010-03-30 NOTE — Assessment & Plan Note (Signed)
Summary: f38m   Visit Type:  2 months follow up Primary Provider:  Luan Pulling  CC:  No more chest pains. Pt. states she is feeling better now.  History of Present Illness: Overall feels better.  Not having any chest pain.  She does get short of breath with activity.    Current Medications (verified): 1)  Diovan 160 Mg Tabs (Valsartan) .... Take One Tablet By Mouth Every Morning and One-Half Tablet Every Evening 2)  Furosemide 40 Mg Tabs (Furosemide) .... Take One Tablet By Mouth Daily. 3)  Fluoxetine Hcl 20 Mg  Tabs (Fluoxetine Hcl) .... Take 1 Tab By Mouth Daily 4)  Premarin 0.625 Mg Tabs (Estrogens Conjugated) .... One By Mouth Daily 5)  Nexium 40 Mg Cpdr (Esomeprazole Magnesium) .... Take 1 Tab Each Morning 6)  Zetia 10 Mg Tabs (Ezetimibe) .... Take One Tablet By Mouth Daily. 7)  Aspirin 81 Mg Tbec (Aspirin) .... Take One Tablet By Mouth Daily 8)  Xanax 0.5 Mg Tabs (Alprazolam) .... Take 1 As Needed 9)  Hydrocodone-Acetaminophen 10-325 Mg Tabs (Hydrocodone-Acetaminophen) .... Take As Needed 10)  Calcium Carbonate-Vitamin D 600-400 Mg-Unit  Tabs (Calcium Carbonate-Vitamin D) .... Take 1 Tablet By Mouth Three Times A Day 11)  Plavix 75 Mg Tabs (Clopidogrel Bisulfate) .... Take One Tablet By Mouth Daily 12)  Lipitor 20 Mg Tabs (Atorvastatin Calcium) .... Take One Tablet By Mouth Daily. 13)  Fish Oil 1000 Mg Caps (Omega-3 Fatty Acids) .... Take 1 Capsule By Mouth Two Times A Day 14)  Metoprolol Succinate 25 Mg Xr24h-Tab (Metoprolol Succinate) .... Take One Tablet By Mouth Daily 15)  Isosorbide Mononitrate Cr 30 Mg Xr24h-Tab (Isosorbide Mononitrate) .... Take One Tablet By Mouth Daily  Allergies: 1)  ! Penicillin 2)  ! Ceclor 3)  ! Keflex  Vital Signs:  Patient profile:   60 year old female Height:      62 inches Weight:      226.50 pounds BMI:     41.58 Pulse rate:   65 / minute Pulse rhythm:   regular Resp:     18 per minute BP sitting:   138 / 70  (left arm) Cuff size:    large  Vitals Entered By: Sidney Ace (May 19, 2009 9:38 AM)  Physical Exam  General:  Well developed, well nourished, in no acute distress. Head:  normocephalic and atraumatic Eyes:  PERRLA/EOM intact; conjunctiva and lids normal. Lungs:  Clear bilaterally to auscultation and percussion. Heart:  Minimal apical murmur.  Normal S1 and S2.  pos S4.  No rub. Abdomen:  Bowel sounds positive; abdomen soft and non-tender without masses, organomegaly, or hernias noted. No hepatosplenomegaly. Extremities:  No pitting edema.   Neurologic:  Alert and oriented x 3.   EKG  Procedure date:  05/19/2009  Findings:      NSR. Nonspecific ST and T abnormality.  Borderline prolonged QTc.  (labs reviewed).  Impression & Recommendations:  Problem # 1:  CORONARY ARTERY BYPASS GRAFT, HX OF (ICD-V45.81)  No angina. Doing well.  Continue medical therapy,.  Of note, despite echo, radionuclide EF was 49%.  May eventually benefit from MRI.  Consider in followup.  Orders: EKG w/ Interpretation (93000)  Problem # 2:  MIXED HYPERLIPIDEMIA (ICD-272.2)  Labs reviewed in detail.  Not far off target.   Her updated medication list for this problem includes:    Zetia 10 Mg Tabs (Ezetimibe) .[name]... Take one tablet by mouth daily.    Lipitor 20 Mg Tabs (Atorvastatin calcium) .Marland KitchenMarland KitchenMarland KitchenMarland Kitchen  Take one tablet by mouth daily.  Her updated medication list for this problem includes:    Zetia 10 Mg Tabs (Ezetimibe) .[name]... Take one tablet by mouth daily.    Lipitor 20 Mg Tabs (Atorvastatin calcium) .[name]... Take one tablet by mouth daily.  Problem # 3:  HYPERTENSION NEC (ICD-997.91) Controlled.    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  Needs to lose some weight and we discussed goals.  approx 20lbs in 3 months which seems reasonable in spring months.  Her updated medication list for this problem includes:    Diovan 160 Mg Tabs (Valsartan) .[name]... Take one tablet by mouth every morning and  one-half tablet every evening    Furosemide 40 Mg Tabs (Furosemide) .[name]... Take one tablet by mouth daily.    Aspirin 81 Mg Tbec (Aspirin) .[name]... Take one tablet by mouth daily    Plavix 75 Mg Tabs (Clopidogrel bisulfate) .[name]... Take one tablet by mouth daily    Metoprolol Succinate 25 Mg Xr24h-tab (Metoprolol succinate) .[name]... Take one tablet by mouth daily    Isosorbide Mononitrate Cr 30 Mg Xr24h-tab (Isosorbide mononitrate) .[name]... Take one tablet by mouth daily  Her updated medication list for this problem includes:    Diovan 160 Mg Tabs (Valsartan) .[name]... Take one tablet by mouth every morning and one-half tablet every evening    Furosemide 40 Mg Tabs (Furosemide) .[name]... Take one tablet by mouth daily.    Aspirin 81 Mg Tbec (Aspirin) .[name]... Take one tablet by mouth daily    Plavix 75 Mg Tabs (Clopidogrel bisulfate) .[name]... Take one tablet by mouth daily    Metoprolol Succinate 25 Mg Xr24h-tab (Metoprolol succinate) .[name]... Take one tablet by mouth daily    Isosorbide Mononitrate Cr 30 Mg Xr24h-tab (Isosorbide mononitrate) .[name]... Take one tablet by mouth daily  Patient Instructions: 1)  Your physician recommends that you schedule a follow-up appointment in: 3 MONTHS 2)  Your physician recommends that you continue on your current medications as directed. Please refer to the Current Medication list given to you today.

## 2010-03-30 NOTE — Assessment & Plan Note (Signed)
Summary: 3 month rov   Visit Type:  3 months follow up Primary Provider:  Luan Pulling  CC:  Chest pains and Sob. Pt. went to [facility] High blood pressure and palpitations.  History of Present Illness: In for followup after recent admission to [facility].  Enzymes were negative, but nuc study did suggest some ischemia.  Of note, has done well since discharge.  Etiol of symptoms unclear.  Defects were noted both apically, and in the inferobasal segment.  EF was 49%.  Improved symptomatically.  Angiographic studies reviewed with patient today.  Current Medications (verified): 1)  Diovan 160 Mg Tabs (Valsartan) .... Take One Tablet By Mouth Daily 2)  Furosemide 40 Mg Tabs (Furosemide) .... Take One Tablet By Mouth Daily. 3)  Fluoxetine Hcl 20 Mg  Tabs (Fluoxetine Hcl) .... Take 1 Tab By Mouth Daily 4)  Premarin 0.625 Mg Tabs (Estrogens Conjugated) .... One By Mouth Daily 5)  Nexium 40 Mg Cpdr (Esomeprazole Magnesium) .... Take 1 Tab Each Morning 6)  Zetia 10 Mg Tabs (Ezetimibe) .... Take One Tablet By Mouth Daily. 7)  Aspirin 81 Mg Tbec (Aspirin) .... Take One Tablet By Mouth Daily 8)  Xanax 0.5 Mg Tabs (Alprazolam) .... Take 1 As Needed 9)  Hydrocodone-Acetaminophen 10-325 Mg Tabs (Hydrocodone-Acetaminophen) .... Take As Needed 10)  Calcium Carbonate-Vitamin D 600-400 Mg-Unit  Tabs (Calcium Carbonate-Vitamin D) .... Take 1 Tablet By Mouth Three Times A Day 11)  Plavix 75 Mg Tabs (Clopidogrel Bisulfate) .... Take One Tablet By Mouth Daily 12)  Lipitor 20 Mg Tabs (Atorvastatin Calcium) .... Take One Tablet By Mouth Daily. 13)  Fish Oil 1000 Mg Caps (Omega-3 Fatty Acids) .... Take 1 Capsule By Mouth Two Times A Day 14)  Metoprolol Succinate 25 Mg Xr24h-Tab (Metoprolol Succinate) .... Take One Tablet By Mouth Daily 15)  Isosorbide Mononitrate Cr 30 Mg Xr24h-Tab (Isosorbide Mononitrate) .... Take One Tablet By Mouth Daily  Allergies: 1)  ! Penicillin 2)  ! Ceclor 3)  !  Keflex  Vital Signs:  Patient profile:   60 year old female Height:      62 inches Weight:      220.75 pounds BMI:     40.52 Pulse rate:   63 / minute Pulse rhythm:   regular Resp:     18 per minute BP sitting:   160 / 80  (left arm) Cuff size:   large  Vitals Entered By: Sidney Ace (January 13, 2009 9:57 AM)  Physical Exam  General:  moderately obese female NAD. Lungs:  Clear bilaterally to auscultation and percussion. Heart:  soft apical murmur. S4 gallop.  no S3.  Normal S1 and S2. Abdomen:  Bowel sounds positive; abdomen soft and non-tender without masses, organomegaly, or hernias noted. No hepatosplenomegaly. Extremities:  No clubbing or cyanosis.   Cardiac Cath  Procedure date:  05/29/2008  Findings:      CONCLUSIONS: 1. Moderately reduced overall left ventricular systolic function with     an inferobasal wall motion abnormality. 2. Continued patency of the combined saphenous vein graft internal     mammary to both the diagonal and the distal circumflex. 3. Continued patency of the stents to the circumflex system. 4. Continued patency of the vein graft to the acute marginal branch. 5. Moderate pulmonary hypertension, with some elevation in pulmonary     vascular resistance   EKG  Procedure date:  01/13/2009  Findings:      NSAR.  Nonspecific ST and T changes.  Prolonged QTC.  Echocardiogram  Procedure date:  10/30/2008  Findings:        Study Conclusions            1. Left ventricle: There is a kinesis of the inferior wall. The        cavity size was normal. Wall thickness was increased in a pattern        of mild LVH. Systolic function was moderately reduced. The        estimated ejection fraction was in the range of 35% to 40%.     2. Mitral valve: Mildly calcified annulus.     3. Left atrium: The atrium was mildly dilated.     4. Right atrium: The atrium was mildly dilated.     5. Tricuspid valve: Mild-moderate regurgitation.     6. Pulmonary  arteries: PA peak pressure: 34mm Hg (S).  Impression & Recommendations:  Problem # 1:  CORONARY ARTERY BYPASS GRAFT, HX OF (ICD-V45.81) Overall stable, and recent cath study in April.  Reviewed today in clinic.  Likely continue medical treatment without repeat cath study. Orders: EKG w/ Interpretation (93000)  Problem # 2:  HYPERTENSION NEC (ICD-997.91) Not well controlled in setting of moderate LV dysfunction, pulm HTN;  Increase Diovan with early followup. Orders: EKG w/ Interpretation (93000)  Patient Instructions: 1)  Your physician has recommended you make the following change in your medication: INCREASE Diovan 160mg  to one tablet every morning and one-half tablet every evening 2)  Your physician recommends that you schedule a follow-up appointment in: 2 WEEKS Prescriptions: DIOVAN 160 MG TABS (VALSARTAN) Take one tablet by mouth every morning and one-half tablet every evening  #135 x 4   Entered by:   Theodosia Quay, RN, BSN   Authorized by:   Wadie Lessen, MD, [facility]   Signed by:   Theodosia Quay, RN, BSN on 01/13/2009   Method used:   Electronically to        [id number] (retail)       [address]       [place], [place]  [address]       Ph: [phone] or [phone]       Fax: [phone]   RxID:   [id number]

## 2010-03-30 NOTE — Consult Note (Signed)
Summary: [facility]   Imported By: Marilynne Drivers 09/17/2008 10:38:14  _____________________________________________________________________  External Attachment:    Type:   Image     Comment:   External Document

## 2010-03-30 NOTE — Assessment & Plan Note (Signed)
Summary: f6w   Visit Type:  6 weeks follow up Primary Provider:  Luan Pulling  CC:  No complains.  History of Present Illness: Doing reasonably well, but still is fairly limited by shortness of breath.  See most recent cath report.  Denies frequent chest pain.  Current Medications (verified): 1)  Atenolol 25 Mg Tabs (Atenolol) .... Take One Tablet By Mouth Daily 2)  Diovan 160 Mg Tabs (Valsartan) .... Take One Tablet By Mouth Daily 3)  Furosemide 40 Mg Tabs (Furosemide) .... Take One Tablet By Mouth Daily. 4)  Fluoxetine Hcl 20 Mg  Tabs (Fluoxetine Hcl) .... Take 1 Tab By Mouth Daily 5)  Premarin 0.625 Mg Tabs (Estrogens Conjugated) .... One By Mouth Daily 6)  Nexium 40 Mg Cpdr (Esomeprazole Magnesium) .... Take 1 Tab Each Morning 7)  Zetia 10 Mg Tabs (Ezetimibe) .... Take One Tablet By Mouth Daily. 8)  Lipitor 40 Mg Tabs (Atorvastatin Calcium) .... Take One Tablet By Mouth Daily. 9)  Aspirin 81 Mg Tbec (Aspirin) .... Take One Tablet By Mouth Daily 10)  Fish Oil   Oil (Fish Oil) .... Take 2 Once Daily 11)  Xanax 0.5 Mg Tabs (Alprazolam) .... Take 1 As Needed 12)  Hydrocodone-Acetaminophen 10-325 Mg Tabs (Hydrocodone-Acetaminophen) .... Take As Needed 13)  Calcium Carbonate-Vitamin D 600-400 Mg-Unit  Tabs (Calcium Carbonate-Vitamin D) .... Take 1 Tablet By Mouth Three Times A Day 14)  Doxycycline Hyclate 100 Mg Tabs (Doxycycline Hyclate) .... Take 1 Tablet By Mouth Two Times A Day  Allergies: 1)  ! Penicillin 2)  ! Ceclor 3)  ! Keflex  Vital Signs:  Patient profile:   60 year old female Height:      62 inches Weight:      215 pounds BMI:     39.47 Pulse rate:   63 / minute Pulse rhythm:   regular Resp:     18 per minute BP sitting:   126 / 70  (left arm) Cuff size:   large  Vitals Entered By: Sidney Ace (September 04, 2008 3:35 PM)  Physical Exam  General:  Well developed, well nourished, in no acute distress. Head:  normocephalic and atraumatic Neck:  JVP not significantly  up Lungs:  Currently clear.  Mild expiratory prolongation. Heart:  Normal S1 and S2.  Soft SEM.   No DM. Abdomen:  Moderately obese.   EKG  Procedure date:  09/04/2008  Findings:      NSR.  NOnspecific ST and T abnormality.  Sleep Study  Procedure date:  07/15/2008  Findings:       IMPRESSION:  Unremarkable nocturnal polysomnography.      Thanks for this referral.               Kofi A. Merlene Laughter, M.D.   Electronically Signed   Echocardiogram  Procedure date:  04/24/2008  Findings:       SUMMARY   -  Overall left ventricular systolic function was moderately to         markedly decreased. Left ventricular ejection fraction was         estimated , range being 30 % to 35 %. There was moderate         diffuse left ventricular hypokinesis. Findings were         suggestive of akinesis of the inferoposterior wall.   -  There was mild mitral annular calcification.   -  The left atrium was mildly dilated.   -  The right atrium was  mildly dilated.   Cardiac Cath  Procedure date:  06/09/2008  Findings:       CONCLUSIONS:   1. Moderately reduced overall left ventricular systolic function with       an inferobasal wall motion abnormality.   2. Continued patency of the combined saphenous vein graft internal       mammary to both the diagonal and the distal circumflex.   3. Continued patency of the stents to the circumflex system.   4. Continued patency of the vein graft to the acute marginal branch.   5. Moderate pulmonary hypertension, with some elevation in pulmonary       vascular resistance      DISPOSITION:  The etiology of this is uncertain.  She does not have a   history suggestive of pulmonary emboli.  Importantly, the patient does   have moderate obesity, and she snores at night.  Her episode that led to   hospitalization   did occur at night and her cousin tells me that she has been sleeping   poorly.  We would be concerned about the possibility of sleep  apnea  Cardiac Cath  Procedure date:  06/09/2008  Findings:       HEMODYNAMIC DATA:   1. RA 16.   2. RV 64/19.   3. Pulmonary artery 64/30, mean 42.   4. Pulmonary capillary wedge 26.   5. Aortic 165/76, mean 113.   6. LV pressure 173/32.   7. No aortic to LV gradient.   8. No significant wedge to LV gradient.   9. Appropriate RV, LV relationship.   10.Thermodilution cardiac output 4.6 L/min.   11.Thermodilution cardiac index 2.3 L/min/m2.   12.Fick cardiac output 5.5 L/min.   13.Fick cardiac index 2.8 L/min/m2.   14.RA saturation 67%.   15.Pulmonary artery saturation 71%.   16.Aortic saturation 95%.   Cardiac Cath  Procedure date:  06/09/2008  Findings:      ANGIOGRAPHIC DATA:   1. Ventriculography was done in the RAO projection.  Overall systolic       function was reduced.  The inferior wall was more hypokinetic than       the remaining segments.  Overall ejection fraction would be       estimated at 40% range.   Impression & Recommendations:  Problem # 1:  CORONARY ARTERY BYPASS GRAFT, HX OF (ICD-V45.81) Overall anatomy stable.  Continue medical therapy.  Carvedilol considered but patient says she has some component of asthma,  Did not tolerate metoprolol well.    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  Did eat some popcorn, though.  Need for restraint reinforced.  Cath data reviewed.  Had elevated BP and elevated RH pressures, with elevated wedge as well.  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  Prob combined systolic and diastolic component.  Weight loss, salt restriction reemphasized.  Pt brought in weights, sodium intake, and Bp today.  May want to enhance BP meds, but overall seems controlled and should improve in environment of restriction of both above criteria.  Also would revise Beta blocker, but see above.  Has asthma and also metop intolerant. Her updated medication list for this  problem includes:    Atenolol 25 Mg Tabs (Atenolol) .[name]... Take one tablet by mouth daily    Diovan 160 Mg Tabs (Valsartan) .[name]... Take one tablet by mouth daily    Furosemide 40 Mg Tabs (Furosemide) .Marland KitchenMarland KitchenMarland KitchenMarland Kitchen  Take one tablet by mouth daily.    Aspirin 81 Mg Tbec (Aspirin) .[name]... Take one tablet by mouth daily  Problem # 3:  MIXED HYPERLIPIDEMIA (ICD-272.2) Followup with Dr. Luan Pulling. Her updated medication list for this problem includes:    Zetia 10 Mg Tabs (Ezetimibe) .[name]... Take one tablet by mouth daily.    Lipitor 40 Mg Tabs (Atorvastatin calcium) .[name]... Take one tablet by mouth daily.  Patient Instructions: 1)  Your physician recommends that you schedule a follow-up appointment in: 6 WEEKS 2)  Your physician recommends that you continue on your current medications as directed. Please refer to the Current Medication list given to you today.

## 2010-03-30 NOTE — Assessment & Plan Note (Signed)
Summary: 2 MONTH ROV   Visit Type:  Follow-up Primary Provider:  hawkins   History of Present Illness: Patient stayed out of the hospital, and is doing quite well.  Since she is taking nitro, is not having the chest pain that she was having.  She feels so much better.  Her BP is better, and better controlled.   She is not sure why she has so much trouble with weight loss.    Current Medications (verified): 1)  Diovan 160 Mg Tabs (Valsartan) .... Take One Tablet By Mouth Every Morning and One-Half Tablet Every Evening 2)  Furosemide 40 Mg Tabs (Furosemide) .... Take One Tablet By Mouth Daily. 3)  Fluoxetine Hcl 20 Mg  Tabs (Fluoxetine Hcl) .... Take 1 Tab By Mouth Daily 4)  Premarin 0.625 Mg Tabs (Estrogens Conjugated) .... One By Mouth Daily 5)  Nexium 40 Mg Cpdr (Esomeprazole Magnesium) .... Take 1 Tab Each Morning 6)  Zetia 10 Mg Tabs (Ezetimibe) .... Take One Tablet By Mouth Daily. 7)  Aspirin 81 Mg Tbec (Aspirin) .... Take One Tablet By Mouth Daily 8)  Xanax 0.5 Mg Tabs (Alprazolam) .... Take 1 As Needed 9)  Hydrocodone-Acetaminophen 10-325 Mg Tabs (Hydrocodone-Acetaminophen) .... Take As Needed 10)  Calcium Carbonate-Vitamin D 600-400 Mg-Unit  Tabs (Calcium Carbonate-Vitamin D) .... Take 1 Tablet By Mouth Three Times A Day 11)  Plavix 75 Mg Tabs (Clopidogrel Bisulfate) .... Take One Tablet By Mouth Daily 12)  Lipitor 20 Mg Tabs (Atorvastatin Calcium) .... Take One Tablet By Mouth Daily. 13)  Fish Oil 1000 Mg Caps (Omega-3 Fatty Acids) .... Take 1 Capsule By Mouth Two Times A Day 14)  Metoprolol Succinate 25 Mg Xr24h-Tab (Metoprolol Succinate) .... Take One Tablet By Mouth Daily 15)  Isosorbide Mononitrate Cr 30 Mg Xr24h-Tab (Isosorbide Mononitrate) .... Take One Tablet By Mouth Daily  Allergies: 1)  ! Penicillin 2)  ! Ceclor 3)  ! Keflex  Past History:  Past Medical History: Last updated: 05/29/2008 PROBLEMS:   1. Tachypalpitations.   2. Coronary artery disease.       a.      Status post percutaneous coronary intervention and bare-        metal stenting of the left circumflex in July 2000.       b.     April 02, 1999, cardiac catheterization revealing an 80%        in-stent restenosis within the left circumflex and subsequent        percutaneous transluminal coronary angioplasty and placement of a        3.0 NIR bare metal stent proximal to previous stent.       c.     Jul 29, 1999, coronary artery bypass graft x3 with a vein        graft to the first diagonal, vein graft to the right coronary        artery, and a free left internal mammary artery to the obtuse        marginal.       d.     January 03, 2003, cardiac catheterization/percutaneous        coronary intervention, left internal mammary artery to the obtuse        marginal was found to be small and thread like.  The two vein        grafts were patent.  The left circumflex had 90% in-stent        restenosis and cutting balloon angioplasty was performed  followed        by placement of a 3.0 x 32 mm Taxus drug-eluting stent.       e.     February 07, 2005, cardiac catheterization/percutaneous        coronary intervention.  There was in-stent restenosis in the left        circumflex and this was treated with cutting balloon angioplasty.       f.     June 08, 2006, cardiac catheterization, vein graft to the        right coronary artery was normal.  Vein graft to the first        diagonal normal, free left internal mammary artery to the obtuse        marginal was patent, although small, left circumflex had 40% in-        stent restenosis, ejection fraction 40-45%.  The patient was        medically managed.   3. Hypertension.   4. Hyperlipidemia.   5. Obesity.   6. Gastroesophageal reflux disease/peptic ulcer disease.   7. Remote tobacco abuse.   8. Depression.   9. Osteoarthritis.   10.Status post hysterectomy.   11.History of pneumonia in December 2009.   Vital Signs:  Patient profile:   60  year old female Height:      62 inches Weight:      220 pounds BMI:     40.38 Pulse rate:   60 / minute BP sitting:   120 / 80  Vitals Entered By: Margaretmary Bayley CMA (March 16, 2009 3:42 PM)  Physical Exam  General:  Well developed, well nourished, in no acute distress. Neck:  Neck supple, no JVD. No masses, thyromegaly or abnormal cervical nodes. Lungs:  Clear bilaterally to auscultation and percussion. Heart:  PMI non displaced.  S4 gallop. Extremities:  No clubbing or cyanosis.  No edema. Neurologic:  Alert and oriented x 3.   Impression & Recommendations:  Problem # 1:  CORONARY ARTERY BYPASS GRAFT, HX OF (ICD-V45.81) No recurrent chest pain.  Isosorbide mononitrate has helped alot.  Continue at persent.  Problem # 2:  MIXED HYPERLIPIDEMIA (ICD-272.2) Followed by Dr. Quintin Alto.  Her updated medication list for this problem includes:    Zetia 10 Mg Tabs (Ezetimibe) .[name]... Take one tablet by mouth daily.    Lipitor 20 Mg Tabs (Atorvastatin calcium) .[name]... Take one tablet by mouth daily.  Problem # 3:  HYPERTENSION NEC (ICD-997.91) better control.  Patient Instructions: 1)  Your physician recommends that you schedule a follow-up appointment in: 2 MONTHS 2)  Your physician recommends that you continue on your current medications as directed. Please refer to the Current Medication list given to you today.

## 2010-05-16 LAB — CREATININE, SERUM
Creatinine, Ser: 0.76 mg/dL (ref 0.4–1.2)
GFR calc Af Amer: >60 mL/min (ref >60)
GFR calc non Af Amer: >60 mL/min (ref >60)

## 2010-05-16 LAB — BUN: BUN: 16 mg/dL (ref 6–23)

## 2010-06-09 LAB — POCT I-STAT 3, VENOUS BLOOD GAS (G3P V)
Acid-Base Excess: 3 mmol/L — ABNORMAL HIGH (ref 0.0–2.0)
Bicarbonate: 26.2 mEq/L — ABNORMAL HIGH (ref 20.0–24.0)
Bicarbonate: 29.1 mEq/L — ABNORMAL HIGH (ref 20.0–24.0)
O2 Saturation: 67 %
O2 Saturation: 71 %
TCO2: 28 mmol/L (ref 0–100)
TCO2: 31 mmol/L (ref 0–100)
pCO2, Ven: 48.6 mmHg (ref 45.0–50.0)
pCO2, Ven: 49.5 mmHg (ref 45.0–50.0)
pH, Ven: 7.338 — ABNORMAL HIGH (ref 7.250–7.300)
pH, Ven: 7.378 — ABNORMAL HIGH (ref 7.250–7.300)
pO2, Ven: 38 mmHg (ref 30.0–45.0)
pO2, Ven: 39 mmHg (ref 30.0–45.0)

## 2010-06-09 LAB — POCT I-STAT 3, ART BLOOD GAS (G3+)
Acid-Base Excess: 2 mmol/L (ref 0.0–2.0)
Bicarbonate: 27.1 mEq/L — ABNORMAL HIGH (ref 20.0–24.0)
O2 Saturation: 95 %
TCO2: 28 mmol/L (ref 0–100)
pCO2 arterial: 43.3 mmHg (ref 35.0–45.0)
pH, Arterial: 7.404 — ABNORMAL HIGH (ref 7.350–7.400)
pO2, Arterial: 78 mmHg — ABNORMAL LOW (ref 80.0–100.0)

## 2010-06-14 LAB — BLOOD GAS, ARTERIAL
Acid-Base Excess: 2.3 mmol/L — ABNORMAL HIGH (ref 0.0–2.0)
Bicarbonate: 25.9 mEq/L — ABNORMAL HIGH (ref 20.0–24.0)
FIO2: 0.21 %
O2 Saturation: 97.6 %
Patient temperature: 37
TCO2: 22.6 mmol/L (ref 0–100)
pCO2 arterial: 37.8 mmHg (ref 35.0–45.0)
pH, Arterial: 7.45 — ABNORMAL HIGH (ref 7.350–7.400)
pO2, Arterial: 95.1 mmHg (ref 80.0–100.0)

## 2010-06-15 LAB — BASIC METABOLIC PANEL
BUN: 10 mg/dL (ref 6–23)
CO2: 28 mEq/L (ref 19–32)
Calcium: 9.6 mg/dL (ref 8.4–10.5)
Chloride: 105 mEq/L (ref 96–112)
Creatinine, Ser: 0.75 mg/dL (ref 0.4–1.2)
GFR calc Af Amer: >60 mL/min (ref >60)
GFR calc non Af Amer: >60 mL/min (ref >60)
Glucose, Bld: 87 mg/dL (ref 70–99)
Potassium: 4.3 mEq/L (ref 3.5–5.1)
Sodium: 140 mEq/L (ref 135–145)

## 2010-06-15 LAB — LIPID PANEL
Cholesterol: 150 mg/dL (ref 0–200)
HDL: 37 mg/dL — ABNORMAL LOW (ref >39)
LDL Cholesterol: 82 mg/dL (ref 0–99)
Total CHOL/HDL Ratio: 4.1 RATIO
Triglycerides: 156 mg/dL — ABNORMAL HIGH (ref <150)
VLDL: 31 mg/dL (ref 0–40)

## 2010-06-15 LAB — COMPREHENSIVE METABOLIC PANEL
ALT: 31 U/L (ref 0–35)
AST: 49 U/L — ABNORMAL HIGH (ref 0–37)
Albumin: 3.8 g/dL (ref 3.5–5.2)
Alkaline Phosphatase: 98 U/L (ref 39–117)
BUN: 7 mg/dL (ref 6–23)
CO2: 26 mEq/L (ref 19–32)
Calcium: 9.3 mg/dL (ref 8.4–10.5)
Chloride: 103 mEq/L (ref 96–112)
Creatinine, Ser: 0.76 mg/dL (ref 0.4–1.2)
GFR calc Af Amer: >60 mL/min (ref >60)
GFR calc non Af Amer: >60 mL/min (ref >60)
Glucose, Bld: 156 mg/dL — ABNORMAL HIGH (ref 70–99)
Potassium: 3.4 mEq/L — ABNORMAL LOW (ref 3.5–5.1)
Sodium: 137 mEq/L (ref 135–145)
Total Bilirubin: 0.5 mg/dL (ref 0.3–1.2)
Total Protein: 7.3 g/dL (ref 6.0–8.3)

## 2010-06-15 LAB — CK TOTAL AND CKMB (NOT AT ARMC)
CK, MB: 1.4 ng/mL (ref 0.3–4.0)
Relative Index: INVALID (ref 0.0–2.5)
Total CK: 50 U/L (ref 7–177)

## 2010-06-15 LAB — D-DIMER, QUANTITATIVE: D-Dimer, Quant: 0.25 ug/mL-FEU (ref 0.00–0.48)

## 2010-06-15 LAB — CARDIAC PANEL(CRET KIN+CKTOT+MB+TROPI)
CK, MB: 0.7 ng/mL (ref 0.3–4.0)
CK, MB: 0.9 ng/mL (ref 0.3–4.0)
CK, MB: 1 ng/mL (ref 0.3–4.0)
Relative Index: INVALID (ref 0.0–2.5)
Relative Index: INVALID (ref 0.0–2.5)
Relative Index: INVALID (ref 0.0–2.5)
Total CK: 34 U/L (ref 7–177)
Total CK: 49 U/L (ref 7–177)
Total CK: 50 U/L (ref 7–177)
Troponin I: 0.01 ng/mL (ref 0.00–0.06)
Troponin I: 0.02 ng/mL (ref 0.00–0.06)
Troponin I: <0.01 ng/mL (ref 0.00–0.06)

## 2010-06-15 LAB — CBC
HCT: 40.8 % (ref 36.0–46.0)
Hemoglobin: 14.1 g/dL (ref 12.0–15.0)
MCHC: 34.7 g/dL (ref 30.0–36.0)
MCV: 88.8 fL (ref 78.0–100.0)
Platelets: 100 10*3/uL — ABNORMAL LOW (ref 150–400)
RBC: 4.59 MIL/uL (ref 3.87–5.11)
RDW: 13.4 % (ref 11.5–15.5)
WBC: 5.2 10*3/uL (ref 4.0–10.5)

## 2010-06-15 LAB — DIFFERENTIAL
Basophils Absolute: 0 10*3/uL (ref 0.0–0.1)
Basophils Relative: 1 % (ref 0–1)
Eosinophils Absolute: 0.1 10*3/uL (ref 0.0–0.7)
Eosinophils Relative: 3 % (ref 0–5)
Lymphocytes Relative: 35 % (ref 12–46)
Lymphs Abs: 1.8 10*3/uL (ref 0.7–4.0)
Monocytes Absolute: 0.4 10*3/uL (ref 0.1–1.0)
Monocytes Relative: 7 % (ref 3–12)
Neutro Abs: 2.8 10*3/uL (ref 1.7–7.7)
Neutrophils Relative %: 55 % (ref 43–77)

## 2010-06-15 LAB — TROPONIN I: Troponin I: 0.05 ng/mL (ref 0.00–0.06)

## 2010-06-15 LAB — PROTIME-INR
INR: 1 (ref 0.00–1.49)
Prothrombin Time: 13.9 seconds (ref 11.6–15.2)

## 2010-06-15 LAB — TSH: TSH: 0.975 u[IU]/mL (ref 0.350–4.500)

## 2010-06-15 LAB — MAGNESIUM: Magnesium: 2.1 mg/dL (ref 1.5–2.5)

## 2010-06-21 ENCOUNTER — Telehealth: Payer: Self-pay | Admitting: Cardiology

## 2010-06-21 NOTE — Telephone Encounter (Signed)
Reviewed with Dr Lia Foyer and the pt needs OV for surgical clearance.  This pt will be able to hold Plavix 5 days but must continue ASA.  MD would like to discuss this at [place].

## 2010-06-21 NOTE — Telephone Encounter (Signed)
I spoke with the pt and she is scheduled for a tendon repair and "clean up spurs" in right foot on 07/19/10.  This procedure will be performed by Dr Malen Gauze at [facility] under general anesthesia.  Dr Irving Shows would like the pt to hold ASA and Plavix 8 days if able, otherwise he would accept 5 days. I will review this information with Dr Lia Foyer.

## 2010-07-07 NOTE — Telephone Encounter (Signed)
Dr Lia Foyer has not had any openings on his schedule at this time and he said the pt could be seen by the PA for surgical clearance.  I spoke with the pt and she has followed up with Dr Irving Shows and they have postponed her surgery at this time.  The pt is scheduled to see Dr Lia Foyer on 07/22/10 and we will discuss surgical clearance at that time. Pt agreed with plan.

## 2010-07-13 NOTE — Assessment & Plan Note (Signed)
[place] OFFICE NOTE   MERCI, WALTHERS                       MRN:          [id number]  DATE:09/27/2006                            DOB:          06/25/1950    Samantha [name] is in for a followup.  Cardiacwise, she is really stabilized,  and done well.  She denies progressive symptoms.  She still has some  shortness of breath, but recently had a dose of steroids.  An inhaler  has helped slightly.  Her chest x-ray was not markedly abnormal  previously, and she has undergone catheterization.  She had a prior drug-  eluting stent placed for in-stent restenosis, and has remained on  chronic aspirin and Plavix.  She has mild thrombocytopenia that has been  followed by the hematologist.  She has also had back problems, and this  is in the area of prior surgery where most of the pain is.   Today on examination, the blood pressure is 140/80.  The pulse is 62.  LUNG FIELDS:  Clear.  CARDIAC:  Rhythm is regular.  EXTREMITIES:  Do not reveal significant edema.   Electrocardiogram demonstrated normal sinus rhythm.  There were small  inferior Q waves.   Overall, [name] continues to get along reasonably well.  We will see  her back in followup in about 4 months.  She should continue to follow  up with Dr. Quintin Alto in [place].  Attention to her cardiac risk factors is  important.     Loretha Brasil. Lia Foyer, MD, [facility]  Electronically Signed    TDS/MedQ  DD: 09/27/2006  DT: 09/28/2006  Job #: [id number]

## 2010-07-13 NOTE — Letter (Signed)
January 30, 2008    Samantha Masse, MD  [place], [place]   RE:  Samantha, [name]  MRN:  [id number]  /  DOB:  1950/08/21   Dear [name],   I had the pleasure of seeing, Samantha [name] in the office today in a  followup visit.  Cardiac-wise, she seems to be getting along well.  She  denies any ongoing chest pain.  She has had a fair amount of cough that  almost always occurs when she lays down at night and sounds like it  could be in part related to reflux, even though she is on a PPI.  She  has not had progressive shortness of breath.   MEDICATIONS:  1. Furosemide 40 mg daily.  2. Premarin 0.65 mg daily.  3. Prozac 20 mg daily.  4. Nexium 40 mg daily.  5. Atenolol 25 mg daily.  6. Diovan 160 daily.  7. Plavix 75 mg daily.  8. Zetia 10 mg daily.  9. Fish oil.  10.Calcium.  11.Lipitor 40 mg nightly.   PHYSICAL EXAMINATION:  VITAL SIGNS:  His weight is 222 pounds, which is  up about 7 pounds from before.  NECK:  Her jugular veins are not significantly distended.  LUNGS:  Fields actually are clear to auscultation and percussion.  CARDIAC:  Rhythm is regular.  EXTREMITIES:  No edema.   I am not quite sure what is the etiology of her symptoms.  It could be  related to elevated left ventricular filling pressures, but her neck  veins are completely flat and she is on diuretics.  It sounds more like  it might be related to acid reflux, and we talked about this in some  detail today.  She is scheduled to see you and I know that she gets all  of her laboratory studies done at your location.  I will be happy to see  her in followup in 6 months, and if anything would come up in the  interim, please do not hesitate to let me know.  Her EKG today revealed  sinus rhythm and nonspecific changes.    Sincerely,      Loretha Brasil. Lia Foyer, MD, [facility]  Electronically Signed    TDS/MedQ  DD: 01/30/2008  DT: 01/31/2008  Job #: [id number]

## 2010-07-13 NOTE — Letter (Signed)
August 23, 2007    Samantha [name]  [address] [place] [address]   RE:  [name], Samantha  MRN:  [id number]  /  DOB:  [birth year]   Dear [name],   I had the pleasure of seeing Samantha [name] in the office today in  followup.  Cardiac wise, she seems to have got along well.  She has not  been having frequent chest pain.  Of note, she has had occasional spells  where she feels really weak and jittery and she gets something to eat  and this resolves.  She does admit that she does not eat breakfast and  sometimes does not eat lunch.   She has had shoulder surgery, which has been successful.   MEDICATIONS:  1. Furosemide 40 mg daily.  2. Premarin 0.625 mg daily.  3. Prozac 20 mg daily.  4. Nexium 40 mg daily.  5. Atenolol 25 mg daily.  6. Diovan 160 mg daily.  7. Plavix 75 mg daily.  8. Zetia 10 mg daily.  9. Fish oil 1200 b.i.d.  10.Calcium daily.  11.Lipitor 40 mg nightly.  12.Aspirin 81 mg daily.   PHYSICAL EXAMINATION:  GENERAL:  She is alert and oriented, in no  distress.  She has had some allergies and has some nasal congestion.  LUNGS:  Her lung fields are clear to auscultation and percussion.  CARDIAC:  Rhythm is regular.  EXTREMITIES:  There is no significant lower extremity edema.   Electrocardiogram demonstrates normal sinus rhythm and nonspecific T-  wave abnormality.   Overall, Ms. Endo appears to be stable.  I have encouraged her to eat  at least small meals throughout the day as it sounds like she  occasionally gets hypoglycemic.  She is also having lab work done in  your office tomorrow and I suggest that she get a basic metabolic  profile including potassium.  We will see her back in 6 months' time.  If she has any problem, please do not hesitate to let me know, and it is  a pleasure to see her.    Sincerely,       Loretha Brasil. Lia Foyer, MD, [facility]  Electronically Signed     Loretha Brasil. Lia Foyer, MD, [facility]  Electronically Signed   TDS/MedQ  DD: 08/23/2007   DT: 08/24/2007  Job #: [id number]

## 2010-07-13 NOTE — Discharge Summary (Signed)
Samantha [name], Samantha [name]                ACCOUNT NO.:  [id number]   MEDICAL RECORD NO.:  [id number]          PATIENT TYPE:  INP   LOCATION:  8184                         FACILITY:  [facility]   PHYSICIAN:  Carlena Bjornstad, MD, FACCDATE OF BIRTH:  12/19/1950   DATE OF ADMISSION:  04/24/2008  DATE OF DISCHARGE:  04/26/2008                               DISCHARGE SUMMARY   DISCHARGE DIAGNOSES:  1. Tachy palpitations.  The patient with no arrhythmias noted on      telemetry.  2. Dyspnea.  The patient ruled out for myocardial infarction by serial      enzymes and 12-lead EKG, unclear etiology of this time.   The patient to follow up with Dr. Lia Foyer in 1 month.   PAST MEDICAL HISTORY:  1. Hypertension.  2. Dyslipidemia.  3. GERD.  4. Coronary artery disease.  See history and physical for further      details.  5. Remote tobacco use.  6. Depression.  7. Osteoarthritis.   ALLERGIES:  Multiple allergies including:  1. PENICILLIN.  2. CECLOR.  3. ZOCOR.  4. LIPITOR.  5. PRAVACHOL.  6. CRESTOR.   The patient does tolerate Lipitor 20 mg; higher doses, she does not  tolerate.   [name] was in her usual state of health until the day of admission.  She woke around 3:30 in the morning complaining of tachy palpitations,  dyspnea, flushing, diaphoresis, nausea, and left arm discomfort.  Took  nitroglycerin without relief and took a Xanax.  Symptoms finally  resolved around 90 minutes after the onset of symptoms.  The patient  came to the ER for further evaluation, was admitted for observation.  Blood work ruled out myocardial infarction.   Potassium 4.3, creatinine 0.75.  TSH 0.975, D-dimer 0.25.  Dr. Percival Spanish  in to see the patient.  On day of discharge, the patient was stable.  No  arrhythmias noted on telemetry monitor.  The patient is stable to be  discharged home.   FOLLOWUP:  Follow up with Dr. Lia Foyer in 1 month.   DURATION OF DISCHARGE ENCOUNTER:  Greater than 30 minutes between  nurse  practitioner and MD time.      Rosanne Sack, ACNP      Carlena Bjornstad, MD, [facility]  Electronically Signed    MB/MEDQ  D:  04/26/2008  T:  04/26/2008  Job:  [id number]

## 2010-07-13 NOTE — Assessment & Plan Note (Signed)
[facility] OFFICE NOTE   Samantha [name], Samantha [name]                       MRN:          [id number]  DATE:01/29/2007                            DOB:          Jan 26, 1951    Samantha [name] is in for followup.  Cardiac-wise, she has gotten along  reasonably well.  She does get short of breath with activity, but she  thinks this is partially from her weight.  She is up a few more pounds,  approximately 8 pounds from the last visit.  She has seen Dr. Carloyn Manner, and  they have discussed whether or not to do epidural injections.  They had  suggested that she come off aspirin and Plavix, but the patient does  have a drug-eluting stent in the circumflex territory.  It is a long  drug-eluting stent.  She has been skeptical about doing it, and in fact  did not get much relief previously.  Of note, the patient denies any  ongoing chest pain.   MEDICATIONS:  Today, medications include:  1. Furosemide 40 mg daily.  2. Premarin 0.625 mg daily.  3. Prozac 20 mg daily.  4. Nexium 40 mg daily.  5. Atenolol 25 mg daily.  6. Diovan 160 mg daily.  7. Plavix 75 mg daily.  8. Zetia 10 mg daily.  9. Aspirin 81 mg daily.  10.Fish oil 1200 mg b.i.d.  11.Calcium 1800 mg daily.  12.Lipitor 40 mg q.h.s.  13.Prilosec q.h.s.   PHYSICAL EXAMINATION:  VITAL SIGNS:  Her blood pressure is 153/83. The  pulse is 76.  LUNGS:  The lung fields actually are clear.  There is prolonged  expiration.  CARDIAC:  Rhythm is regular.  EXTREMITIES:  Reveal no edema.   EKG reveals normal sinus rhythm.  There is nonspecific ST-T wave  abnormality.   The patient's recent laboratory studies include an LDL cholesterol of  116, total cholesterol is 204.  Despite the therapy, the SGOT is 72.   IMPRESSION:  1. Coronary artery disease with prior percutaneous stenting and also      with revascularization surgery.  2. No evidence of in-stent restenosis at the previous site  of drug-      eluting stent placement.  3. Hypercholesterolemia, only moderately controlled.  4. Hypertension.  5. Moderate obesity.  6. Degenerative joint disease involving the back.  7. Fatty liver.   PLAN:  1. We will get a basic metabolic profile.  She is on furosemide.  This      has not been checked.  2. We will see her back in followup in 6 months.  3. I have recommended that she stay on the same current medical      regimen, and fortunately she is not going to have a back injection.     Loretha Brasil. Lia Foyer, MD, [facility]  Electronically Signed    TDS/MedQ  DD: 01/29/2007  DT: 01/29/2007  Job #: [id number]   cc:   [name]

## 2010-07-13 NOTE — H&P (Signed)
NAMESAMHITHA, ROSEN NO.:  [id number]   MEDICAL RECORD NO.:  [id number]          PATIENT TYPE:  OBV   LOCATION:  2229                         FACILITY:  [facility]   PHYSICIAN:  Carlena Bjornstad, MD, FACCDATE OF BIRTH:  1950-11-01   DATE OF ADMISSION:  04/24/2008  DATE OF DISCHARGE:                              HISTORY & PHYSICAL   PRIMARY CARE Delmar Arriaga:  Dr. [name] in [place].   PRIMARY CARDIOLOGIST:  Loretha Brasil. Lia Foyer, MD, [facility]   PATIENT PROFILE:  A 60 year old Caucasian female with extensive history  of CAD status post CABG and multiple PCIs secondary to in-stent  restenosis in the left circumflex who presents following the episode of  tachypalpitations and dyspnea.   PROBLEMS:  1. Tachypalpitations.  2. Coronary artery disease.      a.     Status post percutaneous coronary intervention and bare-       metal stenting of the left circumflex in July 2000.      b.     April 02, 1999, cardiac catheterization revealing an 80%       in-stent restenosis within the left circumflex and subsequent       percutaneous transluminal coronary angioplasty and placement of a       3.0 NIR bare metal stent proximal to previous stent.      c.     Jul 29, 1999, coronary artery bypass graft x3 with a vein       graft to the first diagonal, vein graft to the right coronary       artery, and a free left internal mammary artery to the obtuse       marginal.      d.     January 03, 2003, cardiac catheterization/percutaneous       coronary intervention, left internal mammary artery to the obtuse       marginal was found to be small and thread like.  The two vein       grafts were patent.  The left circumflex had 90% in-stent       restenosis and cutting balloon angioplasty was performed followed       by placement of a 3.0 x 32 mm Taxus drug-eluting stent.      e.     February 07, 2005, cardiac catheterization/percutaneous       coronary intervention.  There was in-stent  restenosis in the left       circumflex and this was treated with cutting balloon angioplasty.      f.     June 08, 2006, cardiac catheterization, vein graft to the       right coronary artery was normal.  Vein graft to the first       diagonal normal, free left internal mammary artery to the obtuse       marginal was patent, although small, left circumflex had 40% in-       stent restenosis, ejection fraction 40-45%.  The patient was       medically managed.  3. Hypertension.  4. Hyperlipidemia.  5.  Obesity.  6. Gastroesophageal reflux disease/peptic ulcer disease.  7. Remote tobacco abuse.  8. Depression.  9. Osteoarthritis.  10.Status post hysterectomy.  11.History of pneumonia in December 2009.   HISTORY OF PRESENT ILLNESS:  A 60 year old Caucasian female with  extensive history as above.  Last catheterization was in 2008.  She was  in her usual state of health until approximately 3:30 this morning when  she awoke with sudden onset of tachypalpitations, dyspnea, flushing,  diaphoresis, nausea, and left arm discomfort.  After approximately 15  minutes, she took a sublingual nitroglycerin without immediate relief  although 20 minutes later, symptoms of flushing and dyspnea began to  improve, although tachypalpitations persisted.  Approximately 10 minutes  after that flushing and dyspnea resumed, she took a Xanax.  All symptoms  eventually relieved by 5 a.m., approximately 90 minutes after onset of  symptoms.  Since then, she has felt weak in general but specifically in  her left arm and bilateral lower extremities.  She has had no  limitations in activity.  She called our office this morning and was  asked to come into the ED.  Currently, she is symptom free with the  exception of weakness.   ALLERGIES:  PENICILLIN, CECLOR, ZOCOR causes myalgias.  LIPITOR 40  causes myalgias.  PRAVACHOL and CRESTOR cause myalgias.  She tolerates  LIPITOR 20.   HOME MEDICATIONS:  1. Lasix 40  mg daily.  2. Premarin 0.625 mg daily.  3. Prozac 20 mg daily.  4. Atenolol 25 mg daily.  5. Diovan 160 mg daily.  6. Plavix 75 mg daily.  7. Zetia 10 mg daily.  8. Fish oil 1 g b.i.d.  9. Calcium t.i.d.  10.Lipitor 20 mg daily.  11.Zantac 150 mg b.i.d.  12.Nexium 40 mg p.r.n.   FAMILY HISTORY:  Mother died in her sleep at age 99, although she had an  MI in lifetime, father died of an MI at 34.  She has a sister with  history of CAD and CABG.  She has a brother who is alive and well.   SOCIAL HISTORY:  She lives in [place] by herself.  She is on disability.  She has a 7- to 8-pack-year history of tobacco abuse, quitting about 15  years ago.  She denies alcohol or drugs.  She walks on a treadmill, 15  to 20 minutes a day, but has only been doing this for a couple of days.   REVIEW OF SYSTEMS:  Positive for left arm pain, generalized weakness,  nausea, diaphoresis, tachypalpitations, and dyspnea in the setting of  symptoms this morning.  Otherwise, all systems reviewed are negative.  She is a full code.   PHYSICAL EXAMINATION:  VITAL SIGNS:  Temperature 98.3, heart rate 68,  respirations 16, blood pressure 139/58, and pulse ox 96% on room air.  GENERAL:  A pleasant white female in no acute distress.  Awake, alert,  and oriented x3.  HEENT:  Normal.  NEUROLOGIC:  Grossly intact, nonfocal.  PSYCH:  Normal affect.  SKIN:  Warm and dry without lesions or masses.  MUSCULOSKELETAL:  Grossly normal without deformity or effusions.  LUNGS:  Respirations regular and unlabored.  Clear to auscultation.  NECK:  No bruits or JVD.  CARDIAC:  Regular S1 and S2.  No S3, S4, or murmurs.  ABDOMEN:  Round, soft, nontender, and nondistended.  Bowel sounds  present x4.  EXTREMITIES:  Warm, dry, and pink.  No clubbing, cyanosis, or edema.  Dorsalis pedis, posterior tibial  pulses 2+ and equal bilaterally.   Chest x-ray is pending.  Lab work is pending.  EKG shows sinus rhythm  and normal axis at a  rate of 67, no acute ST-T changes.   ASSESSMENT AND PLAN:  1. Tachypalpitations, question supraventricular tachycardia versus      atrial fibrillation versus potential ventricular arrhythmia.  The      patient is currently symptom free in sinus rhythm.  She did note      that symptoms resolved once her tachypalpitations resolved.  We      will plan to admit/observe and cycle cardiac markers as well as      evaluate electrolytes and TSH.  We will check a D-dimer, although      PE seems less likely as she is no longer tachypneic nor      tachycardic.  2. Coronary artery disease.  The patient has no chest pain at this      time.  She does have some left arm discomfort in the setting of the      above symptoms, however, notes that she denied any symptoms similar      to anginal equivalent.  Regardless of cycle cardiac markers,      continue all medications which include beta-blocker, ARB, aspirin,      Plavix, statin, and Zetia therapy.  3. Hypertension.  Blood pressure is mildly elevated at the time in the      ED.  We will adjust the medications if necessary.  4. Hyperlipidemia.  Check lipids and LFTs.  Continue statin and Zetia      therapy.  5. Gastroesophageal reflux disease.  Continue PPI.  6. Depression.  Continue Prozac.      Murray Hodgkins, ANP      Carlena Bjornstad, MD, [facility]  Electronically Signed    CB/MEDQ  D:  04/24/2008  T:  04/25/2008  Job:  [id number]

## 2010-07-13 NOTE — Cardiovascular Report (Signed)
NAMEHALLEL, DENHERDER                ACCOUNT NO.:  [id number]   MEDICAL RECORD NO.:  [id number]          PATIENT TYPE:  OIB   LOCATION:  1962                         FACILITY:  [facility]   PHYSICIAN:  Loretha Brasil. Lia Foyer, MD, FACCDATE OF BIRTH:  06/16/1950   DATE OF PROCEDURE:  06/09/2008  DATE OF DISCHARGE:  06/09/2008                            CARDIAC CATHETERIZATION   INDICATIONS:  Ms. Coltrane is a very delightful woman who has had prior  stenting of the circumflex as well as bypass surgery.  Bypass surgery  was done in 2001 by Dr. Prescott Gum.  She has not had recurrent chest  pain, but recent hospital admission with an episode during the night.  Her echocardiography suggested somewhat reduced overall ejection  fraction and therefore I felt, that she would best be served by a repeat  cardiac catheterization.  Importantly, the patient has had swelling in  the lower extremities, and more symptoms recently.  Risks, benefits, and  alternatives were discussed with the patient.  She consented to proceed.  Importantly, prior to her first bypass operation, she had not had chest  pain.   PROCEDURES:  1. Right and left heart catheterization  2. Selective coronary arteriography.  3. Selective left ventriculography.  4. Saphenous vein graft angiography.   DESCRIPTION OF PROCEDURE:  The patient was brought to the  catheterization laboratory and prepped and draped in the usual fashion.  Informed consent was obtained.  We had previously discussed the risks,  benefits, and alternatives with the patient in detail, and she consented  to proceed.  Using a Smart needle, the right femoral vein was entered  and a 7-French sheath was placed.  We attempted to get the right heart  Swan-Ganz catheter up to the superior vena cava including using a J-  wire.  We attempted for approximately 10 minutes and I elected to just  obtain a saturation from the mid right atrium.  Serial pressures were  then performed in the  RA, RV, pulmonary artery, and wedge position and a  PA saturation was obtained.  Thermodilution cardiac outputs were then  performed.  Following this, the right femoral artery was entered.  It  was difficult to feel and we could not hear it.  With a Smart needle  access, however, was obtained and a 4-French sheath was placed.  A  pigtail catheter was then placed in the central aorta and left  ventricular pressures were recorded.  Saturation was also obtained.  Following this, simultaneous wedge LV was done after re-calibration of  both transducers.  Ventriculography was then performed in the RAO  projection.  There were simultaneous RV, LV pressures.  Following this,  both the pigtail catheters were removed and the right heart catheter  removed.  Standard coronary arteriography was performed using a standard  Judkins left catheter and a 3DRC catheter.  The Lonerock was also used to  inject the vein grafts.  Following this, all catheters were subsequently  removed.  The patient was taken to the holding area for sheath removal.   HEMODYNAMIC DATA:  1. RA 16.  2. RV  64/19.  3. Pulmonary artery 64/30, mean 42.  4. Pulmonary capillary wedge 26.  5. Aortic 165/76, mean 113.  6. LV pressure 173/32.  7. No aortic to LV gradient.  8. No significant wedge to LV gradient.  9. Appropriate RV, LV relationship.  10.Thermodilution cardiac output 4.6 L/min.  11.Thermodilution cardiac index 2.3 L/min/m2.  12.Fick cardiac output 5.5 L/min.  13.Fick cardiac index 2.8 L/min/m2.  14.RA saturation 67%.  15.Pulmonary artery saturation 71%.  16.Aortic saturation 95%.   ANGIOGRAPHIC DATA:  1. Ventriculography was done in the RAO projection.  Overall systolic      function was reduced.  The inferior wall was more hypokinetic than      the remaining segments.  Overall ejection fraction would be      estimated at 40% range.  2. The left main is free of critical disease.  3. The LAD courses to the apex.   There is some minor luminal      irregularities but the LAD by and large is patent.  There is a      large diagonal branch with about 30% narrowing.  This divides into      a one branch, and then a second branch which has competitive      filling.  The vein graft to the diagonal is intact and fills this      vessel retrograde with an additional branch noted.  Coming off the      graft is a free internal mammary which was grafted to the distal      OM.  4. The circumflex has a proximally placed overlapping stents which      remain widely patent.  There is about 30-40% area of scar tissue      within the stent.  The obtuse marginal branch coming off distal to      this has about 30% narrowing in the AV circumflex and is patent      with some evidence of competitive filling.  5. The right coronary artery is basically is severely diseased but      nondominant.  It empties into a right ventricular branch with      extensive competitive filling.  6. The saphenous vein graft to the RV branch is widely patent, filling      the nondominant right in a retrograde fashion.   CONCLUSIONS:  1. Moderately reduced overall left ventricular systolic function with      an inferobasal wall motion abnormality.  2. Continued patency of the combined saphenous vein graft internal      mammary to both the diagonal and the distal circumflex.  3. Continued patency of the stents to the circumflex system.  4. Continued patency of the vein graft to the acute marginal branch.  5. Moderate pulmonary hypertension, with some elevation in pulmonary      vascular resistance   DISPOSITION:  The etiology of this is uncertain.  She does not have a  history suggestive of pulmonary emboli.  Importantly, the patient does  have moderate obesity, and she snores at night.  Her episode that led to  hospitalization  did occur at night and her cousin tells me that she has been sleeping  poorly.  We would be concerned about the  possibility of sleep apnea, and  sleep study would certainly be recommended.  Weight loss was recommended  as well.  I will have the patient back to the office and discuss the  findings.  Loretha Brasil. Lia Foyer, MD, [facility]  Electronically Signed     TDS/MEDQ  D:  06/09/2008  T:  06/10/2008  Job:  [id number]   cc:   CV Laboratory  Percell Miller L. Luan Pulling, M.D.

## 2010-07-13 NOTE — Procedures (Signed)
NAMEFRANCE, LUSTY                ACCOUNT NO.:  [id number]   MEDICAL RECORD NO.:  [id number]          PATIENT TYPE:  OUT   LOCATION:  RESP                          FACILITY:  [facility]   PHYSICIAN:  Edward L. Luan Pulling, M.D.DATE OF BIRTH:  May 21, 1950   DATE OF PROCEDURE:  DATE OF DISCHARGE:                            PULMONARY FUNCTION TEST   1. Spirometry shows no ventilatory defect, but does show some evidence      of airflow obstruction most marked in the smaller airways.  2. Lung volumes are normal.  3. DLCO is normal.  4. Arterial blood gases are normal.      Edward L. Luan Pulling, M.D.  Electronically Signed     ELH/MEDQ  D:  03/31/2008  T:  03/31/2008  Job:  [id number]

## 2010-07-16 NOTE — Op Note (Signed)
[place]. [facility]  Patient:    Samantha [name], Samantha [name]                       MRN: [id number] Proc. Date: 07/29/99 Adm. Date:  [id number] Attending:  Len Childs CC:         CVTS Office             Denice Bors. Stanford Breed, M.D. [facility]                           Operative Report  PREOPERATIVE DIAGNOSES: 1. Class IV unstable angina. 2. Severe coronary artery disease.  POSTOPERATIVE DIAGNOSES: 1. Class IV unstable angina. 2. Severe coronary artery disease.  OPERATION:  Coronary artery bypass grafting x 3 (saphenous vein graft to first diagonal, saphenous vein graft to right coronary, free left internal mammary artery graft to obtuse marginal).  SURGEON:  Len Childs, M.D.  ASSISTANT:  Marcellus Scott, P.A.  ANESTHESIA:  General by J. Tedra Senegal, M.D.  INDICATIONS:  The patient is a 60 year old female with known coronary disease, status post angioplasty and stenting of the proximal circumflex.  She returned with symptoms of unstable angina and cardiac catheterization demonstrated restenosis to 90% of the proximal circumflex.  She also had subtotal disease of the diagonal and heavily diseased nondominant right coronary.  Her LAD had no significant disease.  She is referred for coronary revascularization due to her refractory symptoms of angina and recurrent restenosis of the circumflex.  Prior to the operation, I examined the patient in her hospital room following cardiac catheterization and reviewed the results of the latest catheterization with the patient.  I discussed the indications and expected benefits of coronary artery bypass grafting, as well as the major aspects of the operation (choice of conduit, placement of surgical incisions, use of general anesthesia, cardiopulmonary bypass, and the expected hospital recovery).  I also reviewed the alternatives to surgery, as well as the risks associated with the bypass operation, including the risks of  MI, CVA, bleeding, infection, and death.  She understood the aspects of the operation and agreed to proceed with the operation as planned under informed consent.  OPERATIVE FINDINGS:  The patients body habitus made exposure of the lateral left ventricle very difficult.  The circumflex marginal identified was very small, only 1 mm in diameter at most.  A free mammary artery was used for this vessel since she was left-dominant coronary circulation.  The mammary was small, but with good flow.  It was placed on the proximal aspect of the vein graft to the diagonal rather than placing the small mammary directly on the aorta for the proximal anastomosis.  The diagonal was also a very small vessel.  The diagonal and obtuse marginal would not be vessels that would be considered for redo grafting.  DESCRIPTION OF PROCEDURE:  The patient was brought to the operating room and placed supine on the operating table where general anesthesia was induced under invasive hemodynamic monitoring.  The chest, abdomen, and legs were prepped with Betadine and draped as a sterile field.  A median sternotomy was performed as the saphenous vein was harvested from the right lower extremity. It was of average quality.  The left internal mammary artery was harvested as a free graft from the left chest and was flushed with a heparin/papaverine solution.  It was 1-1.5 mm in diameter with good flow.  The sternal retractor  was placed using the deep sternal blade due to the patients body habitus. The pericardium was opened and pursestrings placed in the aorta and right atrium.  The patient was cannulated and placed on cardiopulmonary bypass. Heparin was then administered and the ACT was documented as being therapeutic prior to going on bypass.  The coronaries were identified and the mammary artery and vein grafts were prepared for the distal anastomoses.  A cardioplegia cannula was placed and the patient was cooled to 28  degrees. After the aortic cross clamp was applied, 500 cc of cold blood cardioplegia were delivered to the aortic root with immediate cardioplegic arrest and septal temperature dropping to less than 12 degrees.  Topical iced saline slush was used to augment myocardial preservation and the pericardial insulator pad was used to protect the left phrenic nerve.  The distal coronary anastomoses were then performed.  The first distal anastomosis was the first diagonal, which bifurcated medially into two small branches 1 mm in diameter.  There was a proximal 80-90% stenosis.  The reverse saphenous vein was sewn end to side with a running 7-0 Prolene with good flow through the graft.  The second distal anastomosis was to the RV marginal of the right coronary, which had been totally occluded.  A reverse saphenous vein was sewn end to side to this 1.5 mm vessel with good flow.  Cardioplegia was then redosed.  The third distal anastomosis was the obtuse marginal on the posterolateral aspect of the left ventricle.  This was a small 1.0 mm vessel and a free left mammary graft was sewn end to side with a running 8-0 Prolene. The patient was then rewarmed and the aortic cross clamp was removed.  A partial occlusion clamp was placed on the ascending aorta and two proximal vein anastomoses were performed using a 4.0 mm punch and running 6-0 Prolene. The partial clamp was removed and the vein grafts were perfused.  The free mammary graft was then sewn end to side to the proximal portion of the vein graft to the diagonal using running 8-0 Prolene and there was good flow through all grafts.  The patient was rewarmed to 37 degrees and temporary pacing wires were applied.  The patient was then weaned from cardiopulmonary bypass after the ventilator was reinitiated and the lungs had been expanded. Hemodynamics and blood pressure were stable.  Protamine was administered.  The cannulas were removed.  Hemostasis  was adequate.  The mediastinum was irrigated with warm antibiotic irrigation and the leg incision was irrigated  and closed in a standard fashion.  The pericardium was loosely reapproximated over the aorta and vein grafts superiorly.  Two mediastinal and a left pleural chest tube were placed and brought out through separate incisions.  The sternum was reapproximated with interrupted steel wires in the pectoralis fascia and subcutaneous layers closed with 0 Vicryl.  The skin was closed with a subcuticular and sterile dressings were applied.  The total cardiopulmonary bypass time was 150 minutes with an aortic cross clamp time of 55 minutes. DD:  07/29/99 TD:  08/02/99 Job: [id number]

## 2010-07-16 NOTE — Cardiovascular Report (Signed)
[place]. [facility]  Patient:    Samantha [name], Samantha [name]                       MRN: [id number] Proc. Date: 07/28/99 Adm. Date:  [id number] Attending:  Len Childs CC:         [name], M.D.             Allene Dillon, M.D. [facility]             CV Laboratory                        Cardiac Catheterization  INDICATIONS:  Ms. Komar is a delightful 60 year old white female, well known to me.  She has had prior stenting of the circumflex coronary artery.  She has developed recurrent shortness of breath and presents now for repeat catheterization.  PROCEDURES: 1. Left heart catheterization. 2. Selective coronary angiography. 3. Selective left ventriculography. 4. Subclavian angiography.  DESCRIPTION OF PROCEDURE:  The procedure was performed from the right femoral artery using 5 French sheath.  The 5 French diagnostic catheters were utilized.  She tolerated the procedure without complication.  I subsequently reviewed the films with Dr. Olevia Perches.  HEMODYNAMICS:  The central aortic pressure was 155/86. LV pressure 153/31.  No gradient recorded on pullback across the aortic valve.  ANGIOGRAPHIC DATA:  The left main coronary artery is free of critical disease.  It is a fairly large vessel.  The left anterior descending artery courses to the apex.  It is a large caliber vessel but has mild luminal irregularity.  The first diagonal branch bifurcates and a sub-branch of the diagonal has about 70% narrowing at the ostium which is somewhat calcified.  No other critical lesions are noted.  The circumflex coronary artery was a dominant vessel.  It provides a couple of posterolateral branches and a posterior descending artery.  There is diffuse in-stent re-stenosis right at the ostium.  It extends out into the mid vessel where the previously placed stent was located.  The diffuseness of the restenosis is largely in the second stent.  The right coronary artery is a  nondominant vessel with about an 80-90% stenosis in its midportion.  LEFT VENTRICULOGRAPHY:  Ventriculography in the RAO projection reveals inferobasal hypokinesis.  The ejection fraction is calculated at 46%.  IMPRESSION: 1. Diffuse in-stent re-stenosis in the dominant circumflex coronary artery. 2. A 70% diagonal stenosis. 3. A 90% nondominant right coronary artery stenosis.  DISPOSITION:  I will discuss the options with her.  We could re-dilate this. I would be optimal to provide radiation therapy, but the stenosis extends to the origin of the circumflex which would require radiation in the left main. This is a dominant circumflex vessel.  Given the unknown long-term implications of radiation therapy, we would be reluctant to pursue this course at the present time. DD:  07/28/99 TD:  07/29/99 Job: [id number]

## 2010-07-16 NOTE — Cardiovascular Report (Signed)
[place]. [facility]  Patient:    Samantha [name]                        MRN: [id number] Proc. Date: 04/02/99 Adm. Date:  [id number] Disc. Date: [id number] Attending:  Wadie Lessen CC:         [name], M.D.             Loretha Brasil. Lia Foyer, M.D. [facility]             Cardiovascular Laboratory                        Cardiac Catheterization  INDICATIONS:  Ms. Screws is a very pleasant 60 year old white female who presents with recurrent symptoms.  She underwent intervention in July 2000 because of recurrent chest pain and myocardial infarction; had subtotal occlusion of the native circumflex.  She underwent stenting of this vessel.  She now presents with increasing dyspnea with chest tightness. She has trouble going up a flight of stairs.  She also has a history of back trouble.  She has been on regular Lipitor.  PROCEDURES: 1. Left heart catheterization. 2. Selective coronary angiography. 3. Selective left ventriculography. 4. PTCA of the previously placed coronary stent and stenting of the area just    proximal to the previously placed stent.  DESCRIPTION OF PROCEDURE:  The procedure was performed from the right femoral artery using 6-French catheters.  She tolerated the diagnostic procedure without complications.  Because of the high-grade stenosis in the mid circumflex, it was felt that repeat intervention was warranted.  I discussed this with her; no family was available.  Heparin was given and Integrilin given according to protocol. 39fsheath was used and also a 6-French guide was used for percutaneous intervention. We chose a JL-3.5 guiding catheter after we were unable to seat a Voda-type catheter.  The lesion was crossed with a .014 high-torque floppy wire.  We initially dilated with a 20 mm length 3.0 Maxxim balloon with marked improvement in the appearance.  We also dilated just proximal to the previously placed stent, nd there was some  haziness just in this area.  A short 9 mm stent was then placed ver this overlying area.   A 3.0 NIR Royale stent was used.  We advanced the balloon slightly to take the balloon into the stent.  High pressure inflation was performed at about 12-13 atm.  One additional inflation was performed more distally.  All  catheters were subsequently removed after follow-up guiding shots.  She had a mild headache during the procedure after doses of intracoronary nitroglycerin, but this gradually resolved.  HEMODYNAMIC DATA: 1. Central aortic pressure:  138/78. 2. Left ventricular pressure:  142/17. No gradient on pullback across the aortic valve.  VENTRICULOGRAPHY:  Revealed inferobasilar hypokinesis.  This appeared to be perhaps slightly worse than on the previous study.  This was an area supplied by the distal circumflex territory.  ANGIOGRAPHIC RESULTS: 1. Left Main Coronary Artery:  Free of critical disease. 2. Left Anterior Descending Artery:  Coursed through the apex.  The LAD itself    had minor luminal irregularities, but no critical lesions.  There was a    bifurcating diagonal branch, which had a subbranch that has about 80%    narrowing over segmental area.  This was unchanged from previous studies. 3. Circumflex Artery:  The native circumflex had about 70% narrowing just prior to  the previously placed stent.  In the middle portion of the stent there was also    an 80% stenosis.  Following dilatation this 80% stenosis in the mid stent was    reduced to 20%.  The area just proximal to the stent was stented and there was    no residual stenosis.  Distally the vessel trifurcated.  There was a marginal    branch without critical disease.  There was a second marginal branch with about    40-50% narrowing.  The AV circumflex provided a posterior descending and    a posterolateral system. 4. Right Coronary Artery:  As previously noted, was a nondominant vessel.  This had     tandem stenoses of 80%.  This was unchanged from previous studies.  CONCLUSIONS: 1. Successful percutaneous stenting of the proximal circumflex, with percutaneous    angioplasty of the mid circumflex at the previously placed stent site. 2. Prior myocardial infarction, stable.  DISPOSITION:  The patient will be treated medically at this point.  Dr. Vicenta Aly will see her back in follow-up. DD:  04/02/99 TD:  04/04/99 Job: [id number]

## 2010-07-16 NOTE — Discharge Summary (Signed)
[place]. [facility]  Patient:    Samantha [name], Samantha [name]                       MRN: [id number] Adm. Date:  [id number] Disc. Date: [id number] Attending:  Reinaldo Berber Dictator:   Zenaida Deed, P.A.                           Discharge Summary  ADMISSION DIAGNOSES:  1. 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  2. Hypertension.  3. Gastroesophageal reflux disease.  4. Peptic ulcer disease.  5. History of myocardial infarction with CABG x 3 in 2001.  6. Mild depression.  DISCHARGE DIAGNOSES:  1. 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  2. Hypertension.  3. Gastroesophageal reflux disease.  4. Peptic ulcer disease.  5. History of myocardial infarction with CABG x 3 in 2001.  6. Mild depression.  7. Post hemorrhagic anemia.  8. Hematoma right thigh.  SURGICAL PROCEDURE:  August 08, 2000, Samantha [name] underwent a right total hip arthroplasty by Dr. Vonna Kotyk. [place] assisted by Dr. Assunta Found.  COMPLICATIONS:  None.  CONSULTATIONS:  1. Pharmacy consult for Coumadin therapy, August 08, 2000.  2. Rehabilitation medication and physical therapy consult August 09, 2000.  3. Case management consult August 10, 2000.  HISTORY OF PRESENT ILLNESS:  This 60 year old white female presented to Dr. Durward Fortes with a 4-year history of sudden onset with progressively worsening bilateral hip pain.  At this point the right is more painful then the left. This has been going on for the last 4 years.  The pain is described as a constant ache which becomes sharp in the right groin and trochanter with radiation to the knee at times.  It increases with any prolonged sitting and walking and nothing really seems to relieve it.  The pain does keep her up at night and she occasionally complains of bilateral lower extremity numbness. Because the pain has not been relieved with conservative measures she is presenting for a right total hip arthroplasty.  HOSPITAL  COURSE:  Samantha [name] tolerated her surgical procedure well and without immediate postoperative complications.  She was transferred to 5000.  On postoperative day #1 T-max was 98.9, vitals were stable.  The right hip dressing was intact without drainage.  Legs were neurovascularly intact. Hemoglobin was 9.1, hematocrit 26.3.  She was started on PT per protocol and temperature was monitored.  On postoperative day 2 she did have difficulty with elevated temperature and diaphoresis with her T-max of 102.3.  Pulse 98, BP 90/58, right hip incision well approximated with staples with plus 2 edema.  Negative Hohmann sign.  Her hemoglobin was 8.3, with hematocrit of 24.3, and PT 15.6, INR 1.4.  It was believed she may have had a hematoma into her incisional area and she was transfused with 2 units of packed red blood cells with Lasix in between the unit and an Ace wrap was applied to the right thigh.  She was switched to p.o. pain medications and continued on therapy.  On postoperative day 3 T-max was 101.2. She felt better after the transfusion with improved energy.  Right hip incision unchanged but less swelling. Hemoglobin was 10.8, hematocrit 31.6, PT 17.4, INR 1.7.  She did have some difficulty with constipation that was treated with laxatives.  Her temperature seemed to be decreasing and that was monitored.  On postoperative day 4 T-max was 98.6,  vitals were stable.  Pain well controlled with p.o. medications and her legs were neurovascularly intact. She was believed to be stable for discharge home and was subsequently discharged home that day.  DISCHARGE INSTRUCTIONS:  DIET:  She can resume her prehospitalization diet.  MEDICATIONS:  She can resume all prehospitalization medications except for her aspirin and Vicodin.  These included:  1. Diovan 80 mg p.o. q. day.  2. Prozac 20 mg p.o. q. day.  3. Atenolol 25 mg p.o. q. day.  4. Lasix 40 mg p.o. q. day.  5. K-Dur 20 mEq p.o. q.  day.  6. Lipitor 20 mg p.o. q. day.  7. Prilosec 20 mg p.o. q. day.  8. Premarin 0.625 mg p.o. q. day.  9. Nitroglycerin 0.4 mg sublingual q.5. minutes p.r.n. chest pain. 10. Xanax 0.5 mg p.o. q.4-6h. p.r.n. for pain.  ADDITIONAL MEDICATIONS:  1. OxyContin 20 mg p.o. q.12h. #21 with no refill.  2. Percocet 5 mg 1-2 p.o. q.4h. p.r.n. for pain #45 with no refill.  3. Coumadin 1 tablet p.o. q. day with the dose to be determined by pharmacy.  ACTIVITY:  She is to be partial weightbearing 50% on left, on the right leg with use of the walker.  She is to have home health physical therapy, R.N. and pharmacy per [facility]  FOLLOWUP:  She is to follow up with Dr. Durward Fortes on August 21, 2000, and needs to call [phone] to set up that appointment.  She is to notify Dr. Durward Fortes of a temperature greater than 101.5, chills, pain unrelieved by pain medications or foul smelling drainage from the wound.  She stated a good understanding of these instructions and was subsequently discharged home.  LABORATORY DATA:  X-ray taken of her pelvis and hip on August 08, 2000, showed good position of the right total hip prosthesis with no bony abnormalities.  On August 08, 2000, hemoglobin 10.5, hematocrit 30.1.  On August 09, 2000, hemoglobin 9.1, hematocrit 26.3.  On August 10, 2000, hemoglobin 8.3, hematocrit 24.3.  On August 11, 2000, white count 7.1, hemoglobin 10.8, hematocrit 31.6, platelets 143.  On August 02, 2000, PT 13.2, INR 1, PTT 29.  On August 12, 2000, PT 18.9, INR 2.  On August 09, 2000, glucose 121, calcium 8.1.  On August 10, 2000, sodium 134, potassium 4.7, chloride 102, CO2 30, glucose 150, BUN 12, creatinine 1, and calcium 8.3.  ALT was 47 on August 02, 2000.  All other laboratory studies were within normal limits. DD:  09/12/00 TD:  09/12/00 Job: [id number]

## 2010-07-16 NOTE — Discharge Summary (Signed)
NAMEMADOLYN, Samantha [name]                ACCOUNT NO.:  [id number]   MEDICAL RECORD NO.:  [id number]          PATIENT TYPE:  INP   LOCATION:  4782                         FACILITY:  [facility]   PHYSICIAN:  Kirk Ruths, M.D. LHCDATE OF BIRTH:  11/04/1950   DATE OF ADMISSION:  02/04/2005  DATE OF DISCHARGE:  02/08/2005                                 DISCHARGE SUMMARY   PRIMARY CARDIOLOGIST:  Dr. Addison Lank.   PROCEDURES:  Cutting balloon PTCA, proximal circumflex artery stent,  February 07, 2005.   PRIMARY DIAGNOSES:  1.  Unstable angina pectoris.      1.  Normal serial cardiac markers.      2.  Status post cutting balloon percutaneous coronary angioplasty, 80%          in-stent restenosis proximal circumflex artery February 07, 2005.      3.  Ejection fraction 45%; inferior akinesis.   SECONDARY DIAGNOSES:  1.  Coronary artery disease.      1.  Three-vessel coronary artery bypass graft 2001.      2.  Status post percutaneous coronary angioplasty and stenting          circumflex 2001.      3.  Status post cutting balloon percutaneous coronary angioplasty and          TAXUS stenting, circumflex secondary to in-stent restenosis in          November of 2004.      4.  Ejection fraction 49% by catheterization in 2004.  2.  Dyslipidemia.  3.  Hypertension.  4.  Obesity.  5.  Gastroesophageal reflux disease.      1.  History of peripheral vascular disease.  6.  Remote tobacco.   REASON FOR ADMISSION:  Samantha [name] is a 61 year old female, with known  coronary artery disease, who presented to the emergency room with symptoms  suggestive of unstable angina pectoris.   LABORATORY DATA:  Normal serial cardiac markers.  Lipid profile:  Total  cholesterol 249, triglycerides 244, HDL 50, LDL 150 (cholesterol/HDL ratio  5.)  Hemoglobin A1c 5.7.  Potassium 4.7, BUN 9, creatinine 0.9 at discharge.  Mild thrombocytopenia with platelets 151 on admission, 122 at discharge.   Admission chest x-ray:   No acute disease.   HOSPITAL COURSE:  The patient was admitted for further evaluation and  management of symptoms worrisome for unstable angina pectoris, and was  placed on intravenous heparin and nitroglycerin.  She was continued on home  medication regimen, which included full-dose aspirin, Plavix, and beta-  blocker.   Serial cardiac markers were all within normal limits.   Cardiac catheterization, performed by Dr. Sabino Snipes (see report for  full details) revealed an 80% restenotic lesion in the proximal circumflex.  This was successfully treated with cutting balloon PTCA with reduction in  the lesion to less than 10%, with no associated symptoms.   Renal angiography revealed no renal artery stenosis.   LV-gram revealed LVD (EF 45%) with inferior akinesis; no significant  valvular abnormalities.   The patient was cleared for discharge the following morning, in  hemodynamically  stable condition.   Patient will be discharged on all previous home medications.   MEDICATIONS AT DISCHARGE:  1.  Plavix 75 mg daily.  2.  Coated aspirin 325 mg daily.  3.  Lasix 40 mg daily.  4.  Premarin 0.625 mg daily.  5.  Prozac 20 mg daily.  6.  Nexium 40 mg daily.  7.  Atenolol 25 mg daily.  8.  Diovan 160 mg daily.  9.  Zetia 10 mg daily.  10. Xenical 120 mg t.i.d.  11. Xanax 0.5 mg t.i.d. p.r.n.  12. Nitrostat 0.4 mg as directed.   FOLLOWUP:  The patient is to keep previously scheduled followup with Dr. Addison Lank on March 09, 2005 at 4:15 p.m.   INSTRUCTIONS:  The patient is referred to cardiac catheterization discharge  sheet.   DURATION OF DISCHARGE:  Less than 30 minutes, including physician time.      Gene Serpe, P.A. [facility]    ______________________________  Kirk Ruths, M.D. [facility]    GS/MEDQ  D:  02/08/2005  T:  02/09/2005  Job:  [id number]   cc:   [name]  Fax: [phone]

## 2010-07-16 NOTE — Discharge Summary (Signed)
[facility]  Patient:    Samantha [name]                        MRN: [id number] Adm. Date:  [id number] Disc. Date: [id number] Attending:  Wadie Lessen Dictator:   Sharyl Nimrod, P.A.C. CC:         Loretha Brasil. Lia Foyer, M.D. [facility]             Dr. Franchot Gallo                           Discharge Summary  DATE OF BIRTH:  07/26/1950  HISTORY OF PRESENT ILLNESS:  Samantha [name] is a 60 year old white female with a history of MI with urgent circumflex stenting in July 2000. An abnormal Cardiolite in August provoked a re-look catheterization in August 2000. She had 80% nondominant RCA, 70 to 80% diagonal, and no restenosis of the circumflex. Her ejection fraction was 55%. In November, she was doing well; but she was seen in the office on February 1 because of a one-week history of dyspnea on exertion and chest discomfort. She states that she can hardly walk up a flight of stairs and has been sleeping on three pillows. She has also noticed increased lower extremity edema and has gained 30 pounds since she quit smoking after her myocardial infarction. She has a history of chronic back trouble, and it is difficult for her to exercise. She also has a history of hyperlipidemia.  PRE-ADMISSION LABORATORY DATA:  Are not on the chart and neither is her x-ray.  Her EKG shows normal sinus rhythm, old inferior myocardial infarction. No acute  changes.  CBC:  Post procedure revealed a H&H of 10.8 and 36.1. MCHC was slightly low at 30.1. Platelets 189, WBCs 5.7. CK was 49, with an MB of 1.  HOSPITAL COURSE:  Samantha [name] was admitted to 5100. Catheterization was performed on February 2 by Loretha Brasil. Lia Foyer, M.D. This revealed two mid 80% RCA lesions, 80% proximal circumflex lesion, and 50% mid circumflex, 80% mid circumflex, distal 0% circumflex, 80% lesion off of a diagonal 1 branch. According to Dr. Lucia Gaskins progress note, he angioplastied the mid and distal  lesions of the circumflex and stented the proximal circumflex. Post sheath removal and bedrest, she was ambulating without difficulty. On the 3rd, it was felt that she could be discharged home.  DISCHARGE DIAGNOSES:  Unstable angina, status post angioplasty/stenting of her proximal circumflex, angioplasty of her mid and distal circumflex. History as previously described.  DISPOSITION:  She is discharged home. She was given a new prescription for Plavix 40 mg q.d. for four weeks. The remainder of her medications remain the same.  DISCHARGE MEDICATIONS: 1. Atenolol 25 mg q.d. 2. Lipitor 40 mg q.d. 3. Prilosec 20 q.d. 4. Premarin 0.625 q.d. 5. Aspirin 325 q.d. 6. Valsartan 80 mg q.d. 7. Lasix 20 q.d. 8. K-Dur 20 mEq q.d. 9. Nitroglycerin sublingual as needed.  INSTRUCTIONS:  No lifting, sexual activity, or heavy exertion for two days. She was given permission to resume cardiac rehab on Monday. Maintain low-salt, fat, and  cholesterol diet. If she had any problems with her catheterization site, she was asked to call immediately. She was asked to call on Monday to the office to arrange a groin check and a CBC for Wednesday and appointment with Loretha Brasil. Lia Foyer, M.D. DD:  04/03/99 TD:  04/03/99 Job: [id number]

## 2010-07-16 NOTE — H&P (Signed)
NAMESHERRA, Samantha [name]                ACCOUNT NO.:  [id number]   MEDICAL RECORD NO.:  [id number]          PATIENT TYPE:  INP   LOCATION:  [place]                         FACILITY:  [facility]   PHYSICIAN:  Thomas C. Wall, M.D.   DATE OF BIRTH:  1950-06-09   DATE OF ADMISSION:  02/04/2005  DATE OF DISCHARGE:                                HISTORY & PHYSICAL   PRIMARY CARDIOLOGIST:  Was Dr. Vicenta Aly and will be Dr. Lia Foyer.   PRIMARY CARE PHYSICIAN:  Dr. Quintin Alto in [place].   CHIEF COMPLAINT:  Chest pain/shortness of breath.   HISTORY OF PRESENT ILLNESS:  Samantha [name] is a 60 year old female with a  history of coronary artery disease. Approximately a week ago she developed  dyspnea on exertion to the point that she could not walk room to room in her  home without getting short of breath. She stated that this became worse to  the point that yesterday she was short of breath at rest. Yesterday she also  developed chest pain that radiated up into her jaw and to her left arm. It  was described as a pressure and a 10/10. Each episode would last 4 or 5  minutes. She had at least five episodes yesterday. Sublingual nitroglycerin  helped. This a.m. she had recurrent chest pain and sublingual nitroglycerin  x3 eventually relieved it. There was associated nausea but no diaphoresis or  vomiting. She stated that this is her usual angina.   Until yesterday she had not had symptoms since 2004 when she had a Taxus  stent to the circumflex. She said that she was doing well and had lost  weight on Xenical and was feeling better until approximately a week ago,  when her symptoms began. She is currently in the emergency room on IV  nitroglycerin and heparin and is pain free.   PAST MEDICAL HISTORY:  1.  Status post aortocoronary bypass surgery in 2001 with SVG to D1, SVG to      RCA, and free LIMA to OM.  2.  Status post MI with a stent to the circumflex in July 2000.  3.  Status post catheterization in 2001 with  PTCA to the mid and distal      lesions of the circumflex and a stent to the proximal circumflex.  4.  Status post cardiac catheterization in November 2004 with PTCA and      cutting balloon as well as a Taxus stent to the circumflex for in-stent      restenosis; vein graft to the diagonal and RCA are patent but the free      LIMA to OM-2 is atretic.  5.  Left ventricular dysfunction with an EF of 49% and inferior hypokinesis      at catheterization in 2004.  6.  Hypertension.  7.  Hyperlipidemia.  8.  Family history of coronary artery disease.  9.  Remote history of tobacco.  10. Obesity.  11. Gastroesophageal reflux disease symptoms.  12. Osteoarthritis.  13. History of peptic ulcer disease.  14. History of depression.  15. Postoperative anemia.   SURGICAL  HISTORY:  She is status post cardiac catheterizations, back  surgery, hysterectomy, and right total hip arthroplasty.   SOCIAL HISTORY:  She lives in [place] with a friend. She is disabled and  worked in [employer]. She has approximately a 20 pack-year history of tobacco  use and quit about 10 years ago. She does not abuse alcohol or drugs.   FAMILY HISTORY:  Her parents died in their early 93s both with heart  disease, and she also has heart disease in one sister.   ALLERGIES:  1.  PENICILLIN.  2.  CECLOR.  3.  She has myalgias with STATINS.   CURRENT MEDICATIONS:  1.  Xenical 120 mg t.i.d.  2.  Furosemide 40 mg a day.  3.  Premarin 0.625 mg a day.  4.  Prozac 20 mg a day.  5.  Nexium 40 mg a day.  6.  Atenolol 25 mg a day.  7.  Diovan 160 mg a day.  8.  Plavix 75 mg a day.  9.  Zetia 10 mg a day.  10. Aspirin 325 mg a day.  11. Calcium 1500 mg a day.  12. Xanax 0.5 mg p.r.n.  13. Fish oil daily.   REVIEW OF SYSTEMS:  Significant for approximately 10-pound weight loss since  on the Xenical. She has occasional headaches. She has insomnia. Chest pain  as described above. She has occasional palpitations. She has  a history of  depression. She has chronic joint and back pain. She has occasional reflux  symptoms but denies melena, hematemesis or hemoptysis. Review of systems is  otherwise negative.   PHYSICAL EXAMINATION:  VITAL SIGNS:  Temperature is 98.0, blood pressure  175/81, heart rate 74, respiratory rate 20, O2 saturation 98% on 2 L.  GENERAL:  She is a well-developed, obese white female in no acute distress  sitting up but uncomfortable lying down.  HEENT:  Her head is normocephalic and atraumatic with pupils equal, round,  and reactive to light and accommodation. Extraocular movements intact.  Sclerae clear, nares without discharge.  NECK:  No lymphadenopathy, thyromegaly, bruit, or JVD is noted.  CARDIOVASCULAR:  Her heart is regular in rate and rhythm with an S1, S2, and  an S4, but no significant murmur or rub is noted. Distal pulses are 2+ and  no femoral bruits are appreciated.  LUNGS:  She has decreased breath sounds at the bases, but otherwise clear.  SKIN:  No rashes or lesions are noted.  ABDOMEN:  Soft and nontender with active bowel sounds.  EXTREMITIES:  There is no cyanosis, clubbing, or edema noted.  MUSCULOSKELETAL:  There is no joint deformity or effusions and no spine or  CVA tenderness.  NEUROLOGIC:  She is alert and oriented with cranial nerves II-XII grossly  intact.   EKG is sinus rhythm, rate 71, with no acute ischemic changes.   Chest x-ray and laboratory values are pending except point of care markers  are negative x1.   IMPRESSION:  1.  Acute coronary syndrome, unstable anginal pain:  Her symptoms are      identical to previous unstable anginal pain. She is currently pain free      but will need cardiac catheterization Monday. The patient has requested      Dr. Lia Foyer but was advised that he is out of town. She is scheduled at      1 p.m. for Dr. Albertine Patricia. She will be maintained on IV nitroglycerin and     heparin. Integrilin can be  added if her cardiac  enzymes become elevated.  2.  Hyperlipidemia:  Samantha [name] is followed closely by Dr. Quintin Alto and is      tolerating the Zetia well. She is to follow up with Dr. Quintin Alto in      February and at that time she is to get a lipid profile and he will      determine if further medical therapy is needed. She has said that if he      determines that a statin is the only medication that will help her      cholesterol, she will take it and tolerate the myalgias. We will make no      medication changes at this time and encourage her to keep her follow-up      appointments with him.  3.  Hypertension:  We will continue on her current medical therapy as her      blood pressure is fairly well controlled at this time.  4.  She is otherwise stable and will be continued on her home medications.   Dr. Mar Daring saw the patient and determined the plan of care.      Rosaria Ferries, P.A. [place]. Wall, M.D.  Electronically Signed    RB/MEDQ  D:  02/04/2005  T:  02/04/2005  Job:  [id number]

## 2010-07-16 NOTE — Procedures (Signed)
[name], Samantha [name]                ACCOUNT NO.:  [id number]   MEDICAL RECORD NO.:  [id number]          PATIENT TYPE:  OUT   LOCATION:  [place]                          FACILITY:  [facility]   PHYSICIAN:  Kofi A. Merlene Laughter, M.D. DATE OF BIRTH:  1950-07-12   DATE OF PROCEDURE:  DATE OF DISCHARGE:  07/07/2008                             SLEEP DISORDER REPORT   REFERRING PHYSICIAN:  Velvet Bathe, M.D., and Bing Quarry, M.D.   INDICATIONS:  This is a 60 year old lady who presents with hypersomnia  and snoring.  The study is being evaluated for obstructive sleep apnea  syndrome.   MEDICATIONS:  1. Lasix.  2. Diovan.  3. Atenolol.  4. Nexium.  5. Lipitor.  6. Hydrocodone.  7. Aspirin.  8. Premarin.  9. Xanax.   EPWORTH SLEEPINESS SCALE:  15   BMI:  39.   ARCHITECTURE SUMMARY:  This is a nocturnal polysomnography conducted for  379 minutes.  The sleep efficiency is 87%.  Sleep latency 23 minutes.  REM latency 143 minutes.  Stage N1 of 4%, N2 of 28%, N3 of 41%, and REM  sleep 26%.   RESPIRATORY SUMMARY:  Baseline oxygen saturation 97 with lowest  saturation 88.  The AHI is 2.4.   LIMB MOVEMENT SUMMARY:  PLM index 0.   ELECTROCARDIOGRAM SUMMARY:  Average heart rate 61 with isolated PVCs  observed.   IMPRESSION:  Unremarkable nocturnal polysomnography.   Thanks for this referral.      Kofi A. Merlene Laughter, M.D.  Electronically Signed     KAD/MEDQ  D:  07/15/2008  T:  07/15/2008  Job:  [id number]

## 2010-07-16 NOTE — Cardiovascular Report (Signed)
[name]  Patient:    Samantha [name], Samantha [name]                       MRN: [id number] Proc. Date: 07/28/99 Adm. Date:  [id number] Attending:  Len Childs                        Cardiac Catheterization  ADDENDUM:  Subclavian angiography was also performed.  This revealed a calcified, but widely patent subclavian.  The internal mammary was widely patent.  The vertebral had perhaps 50-70% ostial narrowing but the spasm could not be excluded. DD:  07/28/99 TD:  07/29/99 Job: [id number]

## 2010-07-16 NOTE — Cardiovascular Report (Signed)
NAME:  Samantha [name], Samantha [name]                          ACCOUNT NO.:  [id number]   MEDICAL RECORD NO.:  [id number]                   PATIENT TYPE:  OIB   LOCATION:  6529                                 FACILITY:  [facility]   PHYSICIAN:  Junious Silk, M.D. [facility]         DATE OF BIRTH:  1950/09/04   DATE OF PROCEDURE:  01/03/2003  DATE OF DISCHARGE:                              CARDIAC CATHETERIZATION   PROCEDURE PERFORMED:  1. Left heart catheterization with coronary angiography, left     ventriculography and bypass graft angiography.  2. Percutaneous transluminal coronary angioplasty utilizing a cutting     balloon followed by placement of a drug-eluting stent in the proximal to     mid left circumflex coronary artery.   INDICATION:  [name] is a 60 year old woman with history of previous  myocardial infarction treated with stent to the left circumflex.  She then  had a second stent placed, but developed in-stent restenosis.  She  ultimately underwent coronary artery bypass surgery.  She presented to the  office with symptoms of recurrent chest pain.  A stress Cardiolite was  performed which showed evidence of a prior posterior lateral infarct and  suggestion of anterior ischemia.  We therefore proceeded with cardiac  catheterization.   CATHETERIZATION PROCEDURAL NOTE:  A 6 French sheath was placed in the right  femoral artery.  The left coronary arteries were imaged with a 6 French JL-4  catheter.  The native right coronary artery was imaged with a 6 [place] No-  Torque catheter.  The saphenous vein bypass as well as a free internal  mammary artery bypass graft were imaged with a JR-4 catheter.  Left  ventriculography was performed with an angled pigtail catheter.  Contrast  was Omnipaque.  There were no complications.   CATHETERIZATION RESULTS:   HEMODYNAMICS:  1. Left ventricular pressure 160/22.  2. Aortic pressure 182/90.  3. There is no aortic valve gradient.   LEFT  VENTRICULOGRAM:  There is moderate hypokinesis of the inferior wall.  Ejection fraction is calculated at 49%.  There is no mitral regurgitation.   CORONARY ARTERIOGRAPHY (LEFT DOMINANT):  Left main is normal.   Left anterior descending artery has a tubular 30% stenosis in the proximal  vessel followed by a diffuse 30% stenosis in the mid vessel.  The LAD gives  rise to a large branching diagonal branch.  The inferior branch has an 80%  stenosis, however, it is filled via a saphenous vein graft.   Left circumflex is a large dominant vessel.  There are two stents in the  proximal and mid vessel.  There is a diffuse 90% stenosis within the stents.  Beyond the stents, there is a 30% stenosis in the mid vessel.  The  circumflex gives rise to a small first and second marginal branches followed  by normal size posterior lateral branch which has a 50% stenosis.  The  distal circumflex  also gives rise to a normal size posterior descending  artery.   Right coronary artery is a small nondominant vessel.  There is a diffuse 80%  stenosis in the proximal vessel.  The right coronary fills via saphenous  vein graft.   There is a saphenous vein graft to the first diagonal branch which is patent  throughout its course filling the more inferior two branches of this  diagonal.   There is a free left internal mammary artery which anastomosed proximally to  the saphenous vein graft to the diagonal.  The mammary graft is then  anastomosed to the second marginal branch.  However, this graft is very  small and thread-like in appearance and is anastomosed to a very small  marginal branch.  Thus, it is providing no appreciable flow to the remainder  of the left circumflex.   There is a saphenous vein graft to the distal right coronary artery which is  patent throughout its course.   IMPRESSION:  1. Mildly decreased left ventricular systolic function.  2. Native three-vessel coronary artery disease.  3.  Status post coronary artery bypass surgery. The vein grafts to the     diagonal branch and the nondominant right coronary artery are patent.     However, the free left internal mammary graft to the second obtuse     marginal branch is atretic supplying only a small obtuse marginal branch     with no flow into the circumflex proper.   PLAN:  Percutaneous intervention of the native left circumflex.  See below.   PERCUTANEOUS TRANSLUMINAL CORONARY ANGIOPLASTY PROCEDURAL NOTE:  Following  completion of the diagnostic catheterization, we proceed with percutaneous  coronary intervention.  The 6 French sheath in the right femoral artery was  exchanged over wire for a 7 [place] sheath.  The patient was enrolled in the  STEEPLE trial and randomized to Lovenox and Integrelin.  We used a 7 [place]  Voda left 3 guiding catheter. An IQ coronary guide wire was advanced under  fluoroscopic guidance into the distal left circumflex.  We then performed  percutaneous transluminal coronary angioplasty of the proximal left  circumflex with a 3.0 x 10-mm cutting balloon for three inflations.  The  first to eight atmospheres, the second to six atmospheres and the third to  eight atmospheres.  This resulted in significant improvement in the vessel  lumen.  We then carefully positioned a 3.0 x 32-mm Taxus drug-eluting stent  in the proximal to mid circumflex to cover the entire length of the diseased  segment of vessel.  The proximal portion of this stent was carefully  positioned in the ostium of the left circumflex.  This stent was then  deployed at 10 atmospheres.  We then went back with a 3.0 x 20-mm Quantum  balloon and positioned it in the distal aspect of the stent inflating it to  20 atmospheres.  We then positioned it in the proximal aspect of the stent  inflating it to 16 atmospheres.  Final angiographic images were obtained revealing patency of the left circumflex at the stent site with 0% residual   stenosis and TIMI-3 flow.  At the conclusion of the procedure, an Angio-Seal  vascular closure device was placed in the right femoral artery with good  hemostasis.   COMPLICATIONS:  None.    RESULTS:  Successful percutaneous transluminal coronary angioplasty  utilizing cutting balloon followed by placement of a drug-eluting stent in  the proximal to mid left circumflex coronary  artery.  A diffuse 90% in-stent  restenosis was reduced to 0% residual with TIMI-3 flow.   PLAN:  Integrelin will be continued for 18 hours.  Plavix will be  administered for six months.                                               Junious Silk, M.D. [facility]    MWP/MEDQ  D:  01/03/2003  T:  01/04/2003  Job:  [id number]   cc:   [name]  [place]  [place] [address]  Fax: [phone]

## 2010-07-16 NOTE — Cardiovascular Report (Signed)
NAMEZABDI, MIS                ACCOUNT NO.:  [id number]   MEDICAL RECORD NO.:  [id number]          PATIENT TYPE:  OIB   LOCATION:  1965                         FACILITY:  [facility]   PHYSICIAN:  Loretha Brasil. Lia Foyer, MD, FACCDATE OF BIRTH:  May 14, 1950   DATE OF PROCEDURE:  06/08/2006  DATE OF DISCHARGE:                            CARDIAC CATHETERIZATION   INDICATIONS:  Samantha [name] is a 60 year old well-known to [place].  She had  acute inferior infarction treated, and had revascularization surgery.  At that time, she had a vein graft to a nondominant right, a free LIMA  attached to a vein graft which went to the distal obtuse marginal, a  vein graft to the diagonal.  She recently has had some recurrent chest  pain, and was set up for cardiac catheterization.   PROCEDURE:  1. Left heart catheterization.  2. Selective coronary arteriography.  3. Selective left ventriculography.  4. Saphenous vein graft angiography.   DESCRIPTION OF THE PROCEDURE:  The patient was brought to the  catheterization laboratory and prepped and draped in the usual fashion.  Through an anterior puncture, the right femoral artery was entered and a  4-French sheath was placed.  Following this, views of the right coronary  arteries were obtained.  Vein graft angiography was performed without  complication.  Central aortic and left ventricular pressures were  measured with the pigtail.  Ventriculography was performed in the RAO  projection.  There were no complications and the patient was taken to  the holding area in satisfactory clinical condition after direct  hemostasis.   HEMODYNAMIC DATA:  1. Left ventricular pressure 158/12.  2. Aortic pressure 170/78.  3. no gradient pullback across the aortic valve.   ANGIOGRAPHIC DATA:  1. The left main is free of critical disease.  2. The left anterior descending artery courses to the apex.  There is      mild luminal irregularity but it wraps the apical tip without    difficulty.  3. The large diagonal branch of the left anterior descending artery      has one large subbranch which is widely patent.  There is a second      subbranch which represents the previously bypassed artery.  This      demonstrates some competitive filling.  There is a vein graft which      inserts into one of the side branches, and there is slight tenting      at the graft insertion but no significant narrowing.  It fills both      limbs very adequately.  Of note, off this is the free internal      mammary which supplies to the distal obtuse marginal system.  4. The circumflex demonstrates a previously stented vessel.  There      appear to be overlapping stents.  This has previously undergone      cutting balloon angioplasty.  On the current study there does not      appear to be more than about 40% narrowing in this area.  It      supplies two  large posterolateral and a posterior descending branch      which appear free of critical disease.  As previously noted, the      free LIMA does extend off the saphenous vein graft to the diagonal      into the obtuse marginal system.  5. The right coronary is nondominant and is totally occluded.  6. There is a vein graft that goes into the nondominant right vessel.      This vein graft is widely patent, inserting into the acute      marginal.   Ventriculography done in the RAO projection reveals overall ejection  fraction in the range of about 40-45%.  There is inferior akinesis as  noted previously.   FINDINGS:  1. Continued patency of the stent which has been redilated with about      40% narrowing in the mid portion of the circumflex stent.  2. A 40-50% obtuse marginal stenosis distally.  3. Patent saphenous vein graft to the diagonal with a second limb free      left internal mammary artery to the distal circumflex.  4. Total occlusion of the nondominant right.  5. Saphenous vein graft to the distal right coronary.  6. Mild  to moderate reduction in left ventricular function with      inferior wall motion abnormality.   CONCLUSIONS:  1. Acute mild decrease overall left ventricular ejection fraction.  2. Patent saphenous vein grafts to the diagonal and nondominant right      coronary artery.  3. Patent internal mammary, free, to the obtuse marginal.  4. Patent circumflex stent.   PLAN:  The patient will follow up with me in the office.  If she has  some chest pain and shortness of breath other etiologies will be looked  for with this.      Loretha Brasil. Lia Foyer, MD, [facility]  Electronically Signed     TDS/MEDQ  D:  06/08/2006  T:  06/08/2006  Job:  [id number]   cc:   [name]  CV Laboratory

## 2010-07-16 NOTE — Consult Note (Signed)
[place]. [facility]  Patient:    Samantha [name], Samantha [name]                       MRN: [id number] Proc. Date: 07/28/99 Adm. Date:  [id number] Attending:  Fay Records CC:         CVTS Office                          Consultation Report  REASON FOR CONSULTATION:  Recurrent, severe coronary artery disease status post circumflex artery angioplasty and stent placement.  HISTORY OF PRESENT ILLNESS:  The patient is a 60 year old, obese white female with known history of coronary artery disease who is admitted to the hospital with symptoms of dyspnea on exertion and unstable angina with progressive increasing chest pain with a lower exercise threshold.  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  She presented in February 2001 with recurrent angina and was found to have restenosis in the stented circumflex, and she underwent redo angioplasty and a new stent placement.  She started developing angina in early May, this month, and was admitted to the hospital yesterday with symptoms of unstable angina with increasing frequency and severity of chest pain described as dull, midsternal, radiating to the neck, left breast, and upper extremity, and she did have dyspnea on exertion in association with the pain on minimal ambulation.  Her chest pain progressed to rest angina, and she was admitted to the hospital, placed on heparin, and ruled out for MI.  Today she underwent cardiac catheterization by Dr Lia Foyer which demonstrated proximal 90% stenosis of the circumflex, 70% stenosis of a diagonal, and high-grade stenosis of a small, nondominant right coronary.  Overall ejection fraction was 45% with some posterolateral hypokinesia.  She is referred for surgical coronary revascularization due to her bad coronary anatomy  and recurrent symptoms of unstable angina.  PAST MEDICAL HISTORY:  The patient has a history of hypertension and hyperlipidemia.  She has had several lower back operations and has chronic low back pain.  The last procedure was in December 1999.  She has also had a hysterectomy in the past.  MEDICATIONS ON ADMISSION:  1. Lipitor 40 mg p.o. q.d.  2. Lasix 20 mg q.d.  3. Potassium 20 mEq q.d.  4. Premarin 0.625 mg q.d.  5. Tenormin 25 mg p.o. q.d.  6. ______  80 mg p.o. q.d.  7. Prilosec 20 mg q.d.  8. Aspirin 325 mg q.d.  9. Prozac 20 mg q.d. 10. Percocet 11. Xanax 0.5 mg p.r.n.  ALLERGIES:  She states she is allergic to PENICILLIN and CECLOR.  SOCIAL HISTORY:  The patient is single and is a patient of Dr. [name], her family physician in [place], [place].  She is a nonsmoker and does not use alcohol.  She is currently disabled, has worked at a [employer] in [place], [place].  REVIEW OF SYSTEMS:  The patient states her weight has not changed over the past several months.  She denies any associated fever, chills, night sweats, or muscular weakness.  She does have symptoms of angina and dyspnea on exertion as described in History of Present Illness.  She denies any GI symptoms of dysphagia, change in bowel habits, blood per rectum, or abdominal pain.  She denies any pulmonary  history of asthma, productive cough, hemoptysis, or chest injuries.  She denies any GU history of dysuria, polyuria, or hematuria.  She denies any vascular history of phlebitis, DVT, or claudication.  She denies any history of diabetes or thyroid disorder.  She denies any bleeding, diaphysis, or easy bruisability.  She denies skin lesion or rash.  She does have a history of depression on medication currently.  PHYSICAL EXAMINATION:  VITAL SIGNS:  She is 5 feet 2 inches and weight 195 pounds.  Blood pressure 150/80, pulse 76 and regular in sinus rhythm.  GENERAL:  Appearance is that of a  middle-aged, obese, white female in no distress.  Comfortable in her hospital room following cardiac catheterization earlier today.  HEENT:  Normocephalic with full EOMs.  Pink conjunctivae.  Pharynx is clear. Dentition under good repair.  NECK:  Supple without JVD, bruit, mass, or thyromegaly.  LUNGS:  Clear to auscultation, and there are no thoracic deformities.  CARDIAC:  Regular rate and rhythm without S3, gallop, or murmur.  ABDOMEN:  Soft, nontender, and obese.  Bowel sounds are present.  EXTREMITIES:  Her right groin catheterization site is stable without hematoma. Her extremities have palpable pedal pulses, no venous insufficiency, no tenderness or swollen joints.  There is no evidence of venous insufficiency in the lower legs, and pedal pulses are palpable.  NEUROLOGIC:  Alert and oriented x 3 with full motor function.  LABORATORY DATA:  Coronary angiograms are reviewed, and I agree with Dr. Lucia Gaskins interpretation of a proximal 90% stenosis of the dominant circumflex with a 70 to 80% stenosis of the diagonal and a high-grade stenosis of a very small, nondominant right coronary.  Overall ejection fraction is mildly reduced to 45%.  IMPRESSION:  This patient has severe recurrent disease of a dominant circumflex, and she is at high risk for myocardial infarction.  She has had maximal percutaneous therapy of her coronary disease, and I agree with the plan for surgical revascularization.  We will plan on performing a bypass graft to the dominant circumflex and to the diagonal tomorrow.  I reviewed the indications and benefits of the operation with the patient.  I reviewed the major aspects of the procedure including the placement of the surgical incisions, the choice of conduit, the use cardiopulmonary bypass and general anesthesia, and the expected hospital recovery.  I also discussed the specific risks of the operation including the risk of MI, CVA, bleeding, infection,  and death.  She understands these aspects of the operation and agrees to proceed with the operation as planned under informed consent. DD:  07/28/99  TD:  07/28/99 Job: [id number]

## 2010-07-16 NOTE — Assessment & Plan Note (Signed)
[place] OFFICE NOTE   AUDELIA, KNAPE                       MRN:          [id number]  DATE:06/06/2006                            DOB:          11-13-1950    PRIMARY:  Dr. [name].   CARDIOLOGIST:  Dr. Lia Foyer.   REASON FOR PRESENTATION:  Evaluate patient with chest pain.   HISTORY OF PRESENT ILLNESS:  The patient is 60 years old.  She has a  history of coronary disease as described below.  She was last cathed in  2006 when she had in stent restenosis in her circumflex and required  cutting balloon angioplasty.  At that time, she presented with dyspnea.  She had been doing well and had a negative exercise treadmill test in  May of last year by Dr. Lia Foyer.   However, over the last couple of weeks she has had increasing dyspnea  with exertion.  She says she has been getting diaphoretic with exertion.  These are new symptoms for her but similar to previous angina.  About a  week ago she had chest discomfort that radiated up to her throat.  She  took some Tums without improvement.  This was different than her  previous angina but not like reflux.  Her last episode of discomfort was  Saturday.  Both of these were at rest.  She described it as a sharp  discomfort under her breasts.  It was 10/10.  There was diaphoresis.  She had some radiation to her neck.  She was nauseated but did not  vomit.  She took some Tums again without relief.  She took some  sublingual nitroglycerin and this improved the pain instantly.  She  has had no PND or orthopnea.  She does not describe any palpitations,  presyncope or syncope.  She states she has had a decreased exercise  tolerance over the last couple of weeks.   PAST MEDICAL HISTORY:  1. Coronary artery disease with an acute inferior myocardial      infarction, coronary artery bypass graft (2001, free LIMA to the      OM, SVG to the right coronary artery, SVG to  diagonal by Dr. Prescott Gum), TAXUS stenting to the circumflex in December, 2004, cutting      balloon angioplasty in December, 2006, for in stent restenosis,      hypertension, hyperlipidemia, gastroesophageal reflux disease,      peptic ulcer disease, osteoarthritis, depression.   PAST SURGICAL HISTORY:  1. Back surgery.  2. Hysterectomy.  3. Right total hip arthroplasty.  4. CABG.   ALLERGIES:  1. PENICILLIN.  2. CECLOR.  3. STATINS.   MEDICATIONS:  1. Furosemide 40 mg daily.  2. Premarin 0.625 mg daily.  3. Prozac 20 mg daily.  4. Nexium.  5. Atenolol 25 mg daily.  6. Diovan 160 mg daily.  7. Plavix 75 mg daily.  8. Zetia 10 mg daily.  9. Aspirin 325 mg daily.  10.Fish oil.  11.Xenical 120 mg t.i.d.  12.Calcium.  13.Lipitor 40 mg q.h.s.  14.Prilosec.   SOCIAL HISTORY:  1. Patient lives in [place].  2. She is disabled.  3. She quit smoking about 12 years ago.  4. She does not drink alcohol.   FAMILY HISTORY:  1. Is contributory for early heart disease in her sister.  2. Her parents died in their 74's with heart disease.   REVIEW OF SYSTEMS:  As stated in the HPI.  Positive for back pain.  Negative for other systems.   PHYSICAL EXAMINATION:  The patient is in no distress.  Blood pressure  139/74, heart rate 71 and regular, weight 217, body mass index 39.  HEENT:  Eyes unremarkable, pupils equal, round, react to light, fundi  not visualized, oral mucosa unremarkable.  NECK:  No jugular venous distention at 45 degrees, carotid upstroke  brisk and symmetric, no bruits, no thyromegaly.  LYMPHATICS:  No cervical, axillary or inguinal adenopathy.  LUNGS:  Clear to auscultation bilaterally.  BACK:  No costovertebral angle tenderness.  CHEST:  A well healed sternotomy scar.  HEART:  PMI not displaced or sustained, S1 and S2 within normal limits,  no S3, no S4, no clicks, no rubs, no murmurs.  ABDOMEN:  Obese, positive bowel sounds normal in frequency and  pitch, no  bruits, no rebound, no guarding, no midline pulses, no masses, no  hepatomegaly, no splenomegaly.  SKIN:  No rashes, no nodules.  EXTREMITIES:  Pulses 2+ throughout, no edema, no cyanosis, no clubbing.  NEURO:  Oriented to person, place and time, cranial nerves II-XII  grossly intact, motor grossly intact.   EKG:  Sinus rhythm, rate 76, nonspecific T wave changes.   ASSESSMENT AND PLAN:  1. Chest discomfort.  The patient's chest discomfort has some atypical      features.  However, there are some similar features to previous      angina.  Each time she has had symptoms similar to this she has had      some obstruction.  Therefore, the pre-test probability of      obstructive disease is high enough to warrant invasive evaluation      with catheterization.  The patient clearly understands the risks      and benefits of this approach and agrees to proceed.  She will      continue the medications as listed and I will also start isosorbide      mononitrate at 30 mg daily.  She is advised to come to the      emergency room with any unstable symptoms.  2. Hypertension.  She will continue the medications as listed.  3. Followup will be at the time of her catheterization.     Minus Breeding, MD, [facility]  Electronically Signed    JH/MedQ  DD: 06/06/2006  DT: 06/06/2006  Job #: [id number]   cc:   [name]

## 2010-07-16 NOTE — Assessment & Plan Note (Signed)
[place] OFFICE NOTE   Samantha [name], BUSHEE                       MRN:          [id number]  DATE:06/09/2006                            DOB:          September 26, 1950    Ms. Samantha [name] is in for a followup visit.  This very nice lady has been  having some chest pain about which she is fairly significantly worried.  She has been moderately short of breath.  As a result of all of this,  she was brought back in for cardiac catheterization.  In December 2006,  the patient underwent repeat intervention involving the stent in the  proximal circumflex and had a cutting balloon intervention by Dr.  Albertine Patricia.  Yesterday, she underwent cardiac catheterization.  This  demonstrated an LAD that wrapped the apical tip, the diagonal branch was  bypassed, and there was competitive filling of the vessel.  The distal  obtuse marginal had a mammary coming off of the graft which was patent.  The circumflex demonstrated previously stented vessel, and the stent  side actually looked reasonably good.  The right coronary artery was non-  dominant, was totally occluded and there was a vein graft to the non-  dominant right vessel which was widely patent.  Ventriculography in the  RAO projection revealed an ejection fraction in the range of about 40%  to 45% with inferior akinesis as previously noted.  This is not a  dramatic change from prior to this.  However, the patient has continued  to have fairly limited ability to do things and she is really quite  concerned about all of this.   Her medications include:  1. Furosemide 40 mg daily.  2. Premarin 0.625 mg daily.  3. Prozac 20 mg daily.  4. Nexium 40 mg daily.  5. Atenolol 25 mg daily.  6. Diovan 160 mg daily.  7. Plavix 75 mg daily.  8. Zetia 10 mg daily.  9. Aspirin  325 mg daily.  10.She is also taking Lipitor 40 mg nightly.  11.Isosorbide mononitrate 30 mg daily.   Blood pressure is  130/80, pulse is 65.  The groin is stable.  Extremities do not reveal significant edema.   I am not quite sure what the etiology of her symptoms might be.  As a  result, we are going to go ahead and get a D-dimer.  Moreover, I am  going to set her up for a stress nuclear study.  We will see what her  exercise tolerance is.  We will also do a myocardial perfusion imaging  study to see what the overall ejection fraction calculates to and  whether there is a change from the  previous studies.  Once that data is available, we will make a final  determination.  I will see her back in followup to review all of the  studies.     Loretha Brasil. Lia Foyer, MD, [facility]  Electronically Signed    TDS/MedQ  DD: 06/22/2006  DT: 06/22/2006  Job #: [id number]   cc:   [name]

## 2010-07-16 NOTE — Letter (Signed)
Jul 04, 2006    Samantha [name]  [address] [place], [place] [address]   RE:  Samantha, [name]  MRN:  [id number]  /  DOB:  05-25-1950   Dear Samantha [name]:   I had the pleasure of seeing Samantha [name] in the office today in a follow  up visit.  To bring you up to date, she has had more significant  shortness of breath as of late.  Some of this she attributes to being  around her cousin who smokes and has substantially improved since she  has made it a point to stay away from the smoke.  With her evaluation,  she underwent cardiac catheterization.  At the time of cardiac  catheterization, the LAD went to the apex and there was some minimal  irregularity.  The diagonal branch had one large sub-branch, which was  widely patent and the second sub-branch was bypassed.  There was  evidence of competitive filling.  There was some slight tenting of the  vein graft into this site, but it was patent.  There was a free mammary  attached to the vein graft also which supplied the distal circumflex  system.  The circumflex had been stented, and had been redilated and  subsequently appeared to continue to be patent.  The right coronary  artery was non-dominant and was totally occluded, but did have a vein  graft to the acute marginal branch.  Overall ejection fraction was in  the 40%-45% range and there was inferior akinesis, as noted.  Because of  continued symptoms we did a D-dimer which fell within the normal range.  We also did a TSH because she had gained some weight.  This was normal  as well.  There was moderate hyperkinesis by echo of the inferior and  posterior walls and tissue Doppler suggested increased LV filling  pressures.  This is comparable with her prior infarct and also  hypertension.  Chest x-ray revealed stable cardiomegaly and no active  lung disease.  She has had 2 platelet counts, and these have  demonstrated mild thrombocytopenia.   CURRENT MEDICATIONS:  1. Furosemide 40 mg daily.  2.  Premarin 0.625 mg daily.  3. Prozac 20 mg daily.  4. Nexium 40 mg daily.  5. Atenolol 25 mg daily.  6. Diovan 160 mg daily.  7. Plavix 75 mg daily.  8. Zetia 10 mg daily.  9. Aspirin 325 mg daily.  10.Lipitor 40 mg nightly.  11.Prilosec p.r.n.  12.Fish oil b.i.d.  13.She has also stopped isosorbide.   PHYSICAL EXAMINATION:  She is a well appearing.  The lung fields are clear to auscultation and percussion.  The blood pressure today was 160/84 and the pulse was 64.  Cardiac exam was unchanged.   I have encouraged [name] to walk.  She clearly does need to lose  weight and this might have a substantial impact on her current  symptomatology.  We have planned to reassess the situation in 2 months.  I have also asked her to have a followup at the heme/onc center in [place],  and we will do this for evaluation of her thrombocytopenia.  This could  be due to a drug effect and/or possibly even ITP.  We will await their  evaluation.  Thanks so much for allowing me to share in her care and I  will send you a note when I see her back in 2 months.  Again, I have  encouraged her to try to  lose some weight and continue to exercise.  I  have also encouraged her to stay away from her cousin's smoking.    Sincerely,      Samantha [name]. Samantha Foyer, MD, [facility]  Electronically Signed    TDS/MedQ  DD: 07/04/2006  DT: 07/04/2006  Job #: [id number]

## 2010-07-16 NOTE — Op Note (Signed)
[place]. [facility]  Patient:    Samantha [name], Samantha [name]                       MRN: [id number] Proc. Date: 08/08/00 Adm. 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  OPERATION PERFORMED:  Right total hip replacement.  SURGEON:  Vonna Kotyk. Durward Fortes, M.D.  ASSISTANT:  Emogene Morgan. Laurina Bustle, M.D.  ANESTHESIA:  General orotracheal.  COMPLICATIONS:  None.  COMPONENTS:  Depuy Prodigy 13.5 mm femoral stem, large stature with a 28 mm hip ball, 1.5 mm neck length, a Duraloc 100  series 52 mm outer diameter metallic acetabular component with an Apex hole eliminator and the Enduron 10 degree polyethylene liner.  DESCRIPTION OF PROCEDURE:  With the patient comfortable on the operating table and under general orotracheal anesthesia, the patient was placed in the lateral decubitus position with the right side up.  Preoperatively, a catheter had been inserted by the nursing staff.  The patient was secured to the operating room table with the Innomed hip system.  The right hip was then prepped with Betadine scrub and then DuraPrep from iliac crest to below the ankle.  Sterile draping was performed.  A routine Southern incision was utilized and via sharp dissection carried down through the subcutaneous tissues.  There was abundant adipose tissue that made the procedure somewhat technically difficult.  Self-retaining retractors were inserted.  There was probably 4 inches or more of adipose before we encountered the iliotibial band.  This was carefully incised along the length of the incision.  Again self-retaining retractors were inserted more deeply.  Iliotibial band was then incised along the length of the skin incision.   The short external rotators were identified by internally rotating the hip.  They were carefully detached from the posterior aspect of the greater  trochanter and tendinous structures were tagged with 0 Tycron suture.  The capsule was identified and incised.  There was very little joint effusion.  The hip was then dislocated posteriorly. There were areas of articular cartilage loss.  The femoral head was then osteotomized using the AML hip guide over the calcar osteotomy.  A starter hole was then made in the piriformis fossa.  Reaming was performed to 13 mm to accept a 13.5 mm prosthesis.  Rasping was then performed to 13.5 mm large stature or standard canal.  The calcar reamer was used to obtain the appropriate calcar cut.  Retractor was then placed around the acetabulum.  The labrum was incised with a long handled #10 blade.  Reaming was then performed to 51 mm to accept the 52 mm outer diameter prosthesis with excellent bleeding circumferentially.  We trialed a 50 mm prosthesis.  It had good rim fit but completely set within the acetabulum.  A 52 mm outer diameter prosthesis had excellent rim fit and did not completely seat.  We carefully irrigated the entire wound with saline antibiotic solution.  The 52 mm outer diameter Duraloc 100 series acetabular component was then impacted nicely into the acetabulum and with an excellent fit.  We trialed the 10 degree polyethylene liner and again inserted the broach and tried a  1.5 mm neck with the Prodigy neck so to increase the anteversion, the entire construct was reduced with excellent range of motion.  There was absolutely no dislocation.  There was no toggling and we felt like we had re-established normal leg lengths.  The trial components were removed.  Again, the wound was irrigated with saline solution.  An Apex hole eliminator was inserted followed by the final Enduron 10 degree polyethylene liner.  We checked to be sure that it  was fully seated.  The prodigy femoral stem was then impacted flush on the calcar.  The 1.5 mm neck length and  28 mm hip ball was then impacted on the femoral neck.  After carefully cleaning and drying the stem.  The entire construct was then reduced and again we went through a full range of motion in both flexion and extension.  There was no instability.  The wound was again irrigated with saline solution and the capsule closed with 0 Tycron.  The short external rotators were closed with the same material. The iliotibial band was closed with running 0 Vicryl.  The subcutaneous was closed in several layers with 0 and 2-0 Vicryl.  Skin was closed with skin clip.  Sterile bulky dressing was applied.  The patient was then returned to the post anesthesia recovery room in satisfactory condition. DD:  08/08/00 TD:  08/08/00 Job: [id number]

## 2010-07-16 NOTE — Cardiovascular Report (Signed)
NAMEORIT, SANVILLE                ACCOUNT NO.:  [id number]   MEDICAL RECORD NO.:  [id number]          PATIENT TYPE:  INP   LOCATION:  4703                         FACILITY:  [facility]   PHYSICIAN:  Ethelle Lyon, M.D. LHCDATE OF BIRTH:  [birth year]   DATE OF PROCEDURE:  02/07/2005  DATE OF DISCHARGE:                              CARDIAC CATHETERIZATION   PROCEDURE:  Left heart catheterization, left ventriculography, coronary  angiography, cutting balloon angioplasty of the proximal circumflex,  selective left renal artery angiography, StarClose closure of the right  common femoral arteriotomy site.   INDICATIONS:  Ms. Swader is a 60 year old lady status post coronary artery  bypass grafting by Dr. Prescott Gum in [id number]. At that time she had a free  LIMA to the distal circumflex, saphenous vein graft to the diagonal and a  saphenous vein graft to a nondominant RCA at the acute marginal of a  nondominant RCA. She represented in November2004, with unstable angina and  was found to have in-stent restenosis of the proximal circumflex with no  supply of two vessels from the free LIMA. She therefore underwent placement  of a 3.0 x 32 mm TAXUS within the  previously placed stent.   She now presents with abrupt onset of shortness of breath identical to her  prior anginal equivalent. She ruled out for myocardial infarction and was  referred for diagnostic angiography and possible percutaneous coronary  intervention.   PROCEDURAL TECHNIQUE:  Informed consent was obtained. Under 1% lidocaine  local anesthesia, a 5-French sheath was placed in the right common femoral  artery using modified Seldinger technique. Diagnostic angiography was  performed using JL-4 and JR-4 catheters for the native coronaries, JR-4 for  each of two vein grafts. Left heart catheterization and ventriculography was  performed using a pigtail catheter. The pigtail catheter was then pulled  back to the suprarenal abdominal  aorta and abdominal aortography was  performed by power injection. There was a questionable stenosis of the  superior of two left renal arteries. A JR-4 catheter was then used to  selectively engage this vessel. Angiography was performed by hand injection.   The case then turned to percutaneous revascularization of the in-stent  restenosis of the proximal circumflex. The in-stent restenosis was within  the drug-eluting stent that itself was used to treat bare metal stent  restenosis. Anticoagulation was initiated with additional heparin to achieve  and maintain an ACT of greater than 275 seconds. A Voda left 3.5 guide was  advanced over a wire and engaged in the ostium of the left main. Prowater  wire was advanced to the distal circumflex without difficulty. The lesion  was then treated using a 3.0 x 10 mm cutting balloon for two inflations each  of 10 atmospheres. Final angiography demonstrated no residual stenosis and  TIMI-3 flow to the distal vasculature.   The arteriotomy was then closed using a StarClose device. Complete  hemostasis was obtained. She was then transferred to the holding room in  stable condition.   COMPLICATIONS:  None.   FINDINGS:  1.  LV:  166/14/28. EF 45% with  inferior akinesis.  2.  No aortic stenosis or mitral regurgitation.  3.  Left main:  Angiographically normal.  4.  LAD:  Moderate size vessel giving rise to a single moderate-sized      diagonal. This diagonal was subtotally occluded at its origin. There is      a patent vein graft to the diagonal with excellent distal runoff.  5.  Circumflex:  Moderate-sized dominant vessel. There is a previously      placed drug-eluting stent throughout the proximal vessel. This has a      very focal segment of 80% in-stent restenosis. This was treated with      cutting balloon to less than 10% residual. The AV groove circumflex has      a 30% stenosis distally. A small branch is fed via the free LIMA. The      free  LIMA is very small but appears appropriately sized for the small      branch which it feeds.  The free LIMA arises from the proximal portion      of the vein graft to the diagonal.  6.  RCA:  The native RCA was not engaged. However, the saphenous vein graft      to the acute marginal was widely patent. Proximal to its touchdown there      is long segment 90% stenosis.  7.  Renal arteries: There is a single right renal artery and two the left      renal arteries. The right has minimal disease at its origin. The left      superior has 20% ostial stenosis. There appears to be no significant      stenosis of the left inferior.   IMPRESSION/PLAN:  Successful cutting balloon angioplasty of focal in-stent  restenosis in the drug-eluting stent in the proximal circumflex. The patient  should be maintained on Plavix indefinitely, given the long segment of drug-  eluting stent.      Ethelle Lyon, M.D. [facility]  Electronically Signed     WED/MEDQ  D:  02/07/2005  T:  02/07/2005  Job:  [id number]   cc:   [name]  Fax: [phone]   Loretha Brasil. Lia Foyer, M.D. [facility]  [address] [place]  [place] [address]

## 2010-07-16 NOTE — Discharge Summary (Signed)
NAME:  Samantha [name], Samantha [name]                          ACCOUNT NO.:  [id number]   MEDICAL RECORD NO.:  [id number]                   PATIENT TYPE:  OIB   LOCATION:  6529                                 FACILITY:  [facility]   PHYSICIAN:  Junious Silk, M.D. [facility]         DATE OF BIRTH:  [birth year]   DATE OF ADMISSION:  01/03/2003  DATE OF DISCHARGE:  01/04/2003                                 DISCHARGE SUMMARY   PROCEDURES:  1. Cardiac catheterization.  2. Coronary arteriogram.  3. Left ventriculogram.  4. Angiogram.  5. Percutaneous transluminal coronary angioplasty and splint to one vessel.   HOSPITAL COURSE:  Samantha [name] is a 60 year old female with a history of aortic  coronary artery bypass surgery.  She was evaluated in the office by Dr.  Vicenta Aly for somewhat atypical chest pain on January 01, 2003.  However  because of her history of coronary artery disease he felt catheterization  was indicated and she was set up for this on January 03, 2003.   The cardiac catheterization showed severe native three-vessel disease with a  90% circumflex stenosis in a previous area of stenting and pre-LIMA to the  OM-1 and OM-2 that was threadlike.  Dr. Vicenta Aly evaluated the films and  felt that PTCA with a cutting balloon and a drug eluting stent was the best  option for the circumflex and he did this  In-stent restenosis.  Stenosis was reduced from 90% to zero with a Taxus  stent.  Her ventriculogram showed moderate inferior hypokinesis with an  ejection fraction of 49%.   The next day her groin was without hematoma or bruits.  She was started on  Plavix and is to be on this for six months.  Her vital signs were stable and  she was ambulating without chest pain or shortness of breath.  She was seen  by cardiac rehab and given instructions on cardiac risk factor reduction,  stent, angina, use of nitroglycerin as well as Heart Healthy Diet  guidelines.  Ms. Kundert did well and was considered stable  for discharge on  January 04, 2003.   LABORATORY VALUES:  Sodium 137, potassium 4.1, chloride 105, CO2 26, BUN 9,  creatinine 0.8, glucose 111.  Hemoglobin 12.6, hematocrit 36.7, WBC 4700, platelets 177,000.   DISCHARGE CONDITION:  Improved.   DISCHARGE DIAGNOSES:  1. Unstable anginal pain, status post percutaneous transluminal coronary     angioplasty with a cutting balloon and Taxus stent to the circumflex     distribution.  2. Status post aortocoronary bypass surgery in 2001 with saphenous vein     graft to diagonal, saphenous vein graft to right coronary artery, and pre-     LIMA to obtuse marginal-1 and obtuse marginal-2.  3. Mild ventricular dysfunction with an ejection fraction of 49% by     catheterization this admission.  4. Hypertension.  5. Hyperlipidemia.  6. ALLERGY TO PENICILLIN AND CECLOR.  7. Status post hip replacements.  8. Status post percutaneous transluminal coronary angioplasty and stent x2     to the circumflex in 2000.    DISCHARGE INSTRUCTIONS:  1. Her activity level is to include no driving for two days and no strenuous     activity for a week.  She is to call the office for problems with the     cath site. She is to follow up with Dr. Quintin Alto and with Dr. Vicenta Aly.     She is enrolled in the STEEPLE trial and needs to take aspirin as well as     Plavix.   DISCHARGE MEDICATIONS:  1. Aspirin 325 mg daily.  2. Lipitor 40 mg daily.  3. Diovan 160 mg daily.  4. Atenolol 25 mg daily.  5. Premarin 0.625 mg daily.  6. Prozac 20 mg daily.  7. Multivitamin daily.  8. Lasix 40 mg daily.  9. Nexium 40 mg daily.  10.      Calcium daily.  11.      Plavix 75 mg daily.  12.      Nitroglycerin 0.4 mg daily.      Davis Gourd, P.A. [facility]                  Junious Silk, M.D. [facility]    RG/MEDQ  D:  01/07/2003  T:  01/07/2003  Job:  [id number]   cc:   [name]  [place]  [place] [address]  Fax: [phone]

## 2010-07-16 NOTE — Discharge Summary (Signed)
[place]  Patient:    Samantha [name], Samantha [name]                       MRN: [id number] Adm. Date:  [id number] Disc. Date: [id number] Attending:  Len Childs Dictator:   Earnstine Regal, P.A. CC:         Dorris Carnes, M.D.                           Discharge Summary  DATE OF BIRTH:  Aug 12, 1950  ADMISSION DIAGNOSIS: 1. Crescendo angina. 2. Status post myocardial infarction with studying of a surge 08/1998,    PTCA secondary to recent stenosis and studying of the circumflexed 2/01. 3. Hyperlipidemia. 4. History of tobacco use. 5. Positive family history.  DISCHARGE DIAGNOSIS: 1. Recurring ______ for angina with recent ______ 70% diagonal, 90%    nondominant RCA with ejection fraction of 46%. 2. Mild postop hypotension. 3. Postop anemia secondary to blood loss. 4. Urinary tract infection. 5. Hyperlipidemia.  PROCEDURES: 1. Cardiac catheterization Jul 28, 1999 by Dr. Lia Foyer. 2. Coronary artery bypass grafting with left ______ to the obtuse margin,    saphenous veins at right coronary artery, saphenous vein graft to the    diagonal Jul 29, 1999 by Dr. Prescott Gum.  BRIEF HISTORY: The patient is a 60 year old white female, a medical patient of Dr. Dorris Carnes, who is status post myocardial infarction stenting of the circumflex July 2000.  She underwent PTCA secondary to recent stenosis and a new stent placement February 2001.  She presented to the office at this time with crescendo angina.  She notices increasing frequency and severity of chest pain described as dull, midsternal, radiating to the neck and left upper extremity.  She has dyspnea with exertion with minimal ambulation.  She is taking sublingual nitroglycerin with relief.  The last episode of chest pain occurred in the a.m. prior to admission while at rest.  She did not take nitroglycerin and was pain-free when she was seen in the office at [facility].  It was Dr. Joetta Manners opinion  that her symptoms were similar to her pre-stent presentation.  EKG showed normal sinus rhythm with rate of 75 with no acute changes.  She was subsequently admitted for further evaluation treatment and cardiac catheterization.  EKG was normal with no acute changes.  ALLERGIES:  Penicillin and Ceclor.  MEDICATIONS:  Her admission medications included Lipitor 40 mg q.d., Lasix 20 mg q.d., K-Dur 20 mEq q.d., Premarin 0.625 mg q.d., atenolol 25 mg q.d., Diox 80 mg q.d., Prilosec 20 mg q.d., aspirin 325 mg q.d., Prozac 20 mg q.d., Percocet 1-2 p.o. q. 4h p.r.n. for pain, and Xanax 0.5 mg p.o. q. a.m. p.r.n.  HOSPITAL COURSE:  The patient was admitted.  She was stable and underwent cardiac catheterization with the above noted results, which included diffuse in-stent recent stenosis of the dominant circumflex, 70% stenosis of diagonal, and 90% stenosis of the nondominant RCA.  Ejection fraction was estimated at 46%.  It was our opinion that she should undergo coronary artery bypass grafting.  She was seen in consultation by Dr. Tharon Aquas Trigt who agreed the patient had recurrent class 4 angina with severe three vessel disease.  He recommended bypass surgery also.  The patient understood the risks and benefits and was subsequently taken to the operating room for surgery on Jul 29, 1999.  Preoperative studies showed  no significant ICA stenosis.  Wave forms were biphasic bilaterally.  Pulses were palpable bilaterally.  Patient underwent coronary artery bypass grafting as described above, which included left internal mammary, OM 1, saphenous vein graft to the right coronary artery and saphenous graft to the diagonal.  The patient tolerated the procedure well and returned to the intensive care unit in satisfactory condition.  She remained hemodynamically stable postoperatively with cardiac index of 2.6 on low dose dopamine drip.  Urine output was good at greater than 200 cc per hour and she was  extubated with saturations at 98%.  First postoperative morning she was doing well.  She was in sinus rhythm.  Blood pressure was 90/70 on low dose dopamine.  Lungs were clear.  Chest showed no rub.  Abdomen was benign. Hematocrit was 28, platelets 179,000, potassium 5.1, and glucose 152.  The patient was doing quite well.  Luiz Blare was removed, Foley was removed.  She was continued on low dose dopamine.  On July 30, 1999 that evening blood pressure was 85/60 on low dose dopamine.  Hematocrit was 26.  SWAN was discontinued. They discontinued the Toradol.  Blood pressure remained at 90-100/60. Dopamine was continued at a low dose, 4 cc/hour with a cardiac index at 2.4, PAD 19.  She had a productive cough and developed a temperature. Hematocrit was 26, pH  7.06, white count 6000, creatinine 1.2, potassium 5. She was found to have a urinary tract infection and placed on Cipro.  Her hemoglobin was down to 8 and then she was transfused.  She has had some problems with postoperative hypotension, which has slowly improved.  By August 05, 1999 she was doing much better.  At this point her last hemoglobin was 9.9 with hematocrit of 29, white count of 5.4, platelets 190,000.  Sodium was 136, potassium 3.8, chloride 99, CO2 30, glucose 101, BUN 13, creatinine 0.8, and calcium 8.6.  By the seventh postoperative day her wounds looked good.  The pacing wires were removed.  Sternum was stable.  She had a good cough and her leg incisions looked good.  It was Dr. Corliss Parish opinion that she would be ready for discharge in the a.m. of August 06, 1999.  DISCHARGE MEDICATIONS:  She will resume her pre-admission Prozac 20 mg h.s., potassium chloride 20 mg q.d., atenolol 25 mg q.d., Xanax 0.5 mg q.d., Prilosec 20 mg q.d., Premarin 0.625 mg q.d.  New medications will include coated aspirin one q.d., Lasix 40 mg q.d., Tylox 1-2 p.o. q. 4h p.r.n., and a multivitamin with iron one q.d.  She will return in two weeks to see  Dr. Harrington Challenger and three weeks to see Dr. Prescott Gum.  CONDITION ON DISCHARGE: Improving.   DISCHARGE LABORATORIES: Her last hemoglobin is 9.9, hematocrit 29, white count 5.4, platelets 190,000, sodium 136, potassium 3.8, chloride 99, CO2 30, glucose 101, BUN 13, creatinine 0.8, and calcium 8.6. DD:  07/28/99 TD:  08/06/99 Job: [id number]

## 2010-07-21 ENCOUNTER — Encounter: Payer: Self-pay | Admitting: Cardiology

## 2010-07-22 ENCOUNTER — Encounter: Payer: Self-pay | Admitting: Cardiology

## 2010-07-22 ENCOUNTER — Ambulatory Visit (INDEPENDENT_AMBULATORY_CARE_PROVIDER_SITE_OTHER): Payer: Medicare Other | Admitting: Cardiology

## 2010-07-22 DIAGNOSIS — I340 Nonrheumatic mitral (valve) insufficiency: Secondary | ICD-10-CM

## 2010-07-22 DIAGNOSIS — I059 Rheumatic mitral valve disease, unspecified: Secondary | ICD-10-CM

## 2010-07-22 DIAGNOSIS — E782 Mixed hyperlipidemia: Secondary | ICD-10-CM

## 2010-07-22 DIAGNOSIS — I5042 Chronic combined systolic (congestive) and diastolic (congestive) heart failure: Secondary | ICD-10-CM

## 2010-07-22 DIAGNOSIS — I2510 Atherosclerotic heart disease of native coronary artery without angina pectoris: Secondary | ICD-10-CM

## 2010-07-22 NOTE — Patient Instructions (Signed)
Your physician wants you to follow-up in: 6 MONTHS.  You will receive a reminder letter in the mail two months in advance. If you don't receive a letter, please call our office to schedule the follow-up appointment.  Your physician has requested that you have an echocardiogram. Echocardiography is a painless test that uses sound waves to create images of your heart. It provides your doctor with information about the size and shape of your heart and how well your heart's chambers and valves are working. This procedure takes approximately one hour. There are no restrictions for this procedure.  Your physician recommends that you continue on your current medications as directed. Please refer to the Current Medication list given to you today.  

## 2010-07-22 NOTE — Assessment & Plan Note (Signed)
Had prior CABG, and then treatment of her CFX as noted.  Remains on DAPT for treatment of ISR with off label DES.  Has done well.  Would favor remaining on ASA for any procedures.

## 2010-07-22 NOTE — Assessment & Plan Note (Signed)
Followed by her primary with combo of ezetimibe and atorvastatin.

## 2010-07-22 NOTE — Progress Notes (Signed)
HPI:  Patient is in for follow up.  She was to have foot surgery, although they were reluctant to do surgery on ASA off of Plavix for five days.  The patient had stenting of the CFX, then ISR, then placement of Taxus DES for ISR, the repeat intervention.  She has remained on DAPT since that time.  Her last echo was in 2010.  She remains without shortness of breath, and does watch her sodium "most of the time".  No chest pain.  Tolerating medications without difficulty.  More importantly, last weekend she developed numbness in both upper legs.  She denied pain, and leg strength remained normal.  Her surgery for the foot was cancelled, and Dr. Quintin Alto has started some neurontin.  She had called Dr. Rex Kras office, but they deferred her to Dr. Quintin Alto.  She has had prior Ray cages, and low back fusion surgery per patient.   Current Outpatient Prescriptions  Medication Sig Dispense Refill  . ALPRAZolam (XANAX) 0.5 MG tablet Take 0.5 mg by mouth at bedtime as needed.        [name] aspirin 81 MG tablet Take 81 mg by mouth daily.        [name] atorvastatin (LIPITOR) 20 MG tablet Take 20 mg by mouth daily.        . Calcium Carbonate-Vit D-Min 600-400 MG-UNIT TABS Take 1 tablet by mouth daily.        . clopidogrel (PLAVIX) 75 MG tablet Take 75 mg by mouth daily.        [name] esomeprazole (NEXIUM) 40 MG capsule Take 40 mg by mouth daily before breakfast.        . ezetimibe (ZETIA) 10 MG tablet Take 10 mg by mouth daily.        . fish oil-omega-3 fatty acids 1000 MG capsule Take 2 g by mouth 2 (two) times daily.        [name] FLUoxetine (PROZAC) 20 MG tablet Take 20 mg by mouth daily.        . furosemide (LASIX) 40 MG tablet Take 40 mg by mouth daily.        [name] gabapentin (NEURONTIN) 300 MG capsule Take 300 mg by mouth 3 (three) times daily.        [name] HYDROcodone-acetaminophen (NORCO) 10-325 MG per tablet Take 1 tablet by mouth every 6 (six) hours as needed.        . isosorbide mononitrate (IMDUR) 30 MG CR tablet Take 30 mg by mouth  every morning.        . metoprolol succinate (TOPROL-XL) 25 MG 24 hr tablet Take 25 mg by mouth daily.        . valsartan (DIOVAN) 160 MG tablet Take by mouth. Take 1 1/2 tablet daily      . DISCONTD: estrogens, conjugated, (PREMARIN) 0.625 MG tablet Take 0.625 mg by mouth daily. Take daily for 21 days then do not take for 7 days.         Allergies  Allergen Reactions  . Cefaclor   . Cephalexin   . Penicillins     REACTION: rash-    Past Medical History  Diagnosis Date  . Rapid palpitations   . Coronary artery disease     Status post percutaneous coronary intervention and bare metal stenting  of the left circumflex and subsequent percutaneous transluminal coronary anioplasty and placement of a 3.0 NIR bare metal stent proximal to the previous stent.  . Hypertension   . Hyperlipidemia   . Obesity   .  Gastroesophageal reflux disease   . Depression   . Osteoarthritis   . Status post hysterectomy   . History of pneumonia   . H/O: hysterectomy     Past Surgical History  Procedure Date  . Coronary artery bypass graft May 31,2001    x 3 with a vein graft to the first diagonal, vein graft to the right coronary  artery, and a free left internal mammary  artery to the obtuse marginal   . Cardiac catheterization 2004    left internal mammary artery to the obtuse marginal  was found to be small and thread like.  The two grafts were patent.  The left circumflex had 90% in-stent restenosis and cutting balloon angioplasty was performed followed by placement of a 3.0 x 41mm Taxus drug -eluting stent.    . Cardiac catheterization 2006    There was in-stent restenosis in the left circumflex and this was treated with cutting balloon angioplasty   . Cardiac catheterization 2008    vein graft to the to the obtuse marginal was patent, although small, left circumflex had 40% in-stent restenosis, ejection fraction 40-45%.  The patient was medically mananged.    Family History  Problem Relation Age  of Onset  . Heart attack Mother   . Heart attack Father 3    cause of death  . Coronary artery disease Sister     CABG     History   Social History  . Marital Status: Divorced    Spouse Name: N/A    Number of Children: N/A  . Years of Education: N/A   Occupational History  . Disabled    Social History Main Topics  . Smoking status: Former Smoker -- 0.2 packs/day    Quit date: 07/21/1995  . Smokeless tobacco: Not on file  . Alcohol Use: No  . Drug Use: No  . Sexually Active:    Other Topics Concern  . Not on file   Social History Narrative   Lives in [place] by herself.      ROS: Please see the HPI.  All other systems reviewed and negative.  PHYSICAL EXAM:  BP 139/65  Pulse 62  Resp 18  Ht 5\' 2"  (1.575 m)  Wt 219 lb (99.338 kg)  BMI 40.06 kg/m2  General: Well developed, well nourished, in no acute distress. Head:  Normocephalic and atraumatic. Neck: no JVD Lungs: Clear to auscultation and percussion. Heart: Normal S1 and S2.   Minimal apical murmur.   Abdomen:  Normal bowel sounds; soft; non tender; no organomegaly Pulses: Pulses normal in all 4 extremities. Extremities: No clubbing or cyanosis. No edema. Neurologic: Alert and oriented x 3.  EKG:  SB.  LVH.  Nonspecific T wave abnormality.  ECHO (Sept 2 2010)    Study Conclusions    1. Left ventricle: There is a kinesis of the inferior wall. The      cavity size was normal. Wall thickness was increased in a pattern      of mild LVH. Systolic function was moderately reduced. The      estimated ejection fraction was in the range of 35% to 40%.   2. Mitral valve: Mildly calcified annulus.   3. Left atrium: The atrium was mildly dilated.   4. Right atrium: The atrium was mildly dilated.   5. Tricuspid valve: Mild-moderate regurgitation.   6. Pulmonary arteries: PA peak pressure: 45mm Hg (S).  ASSESSMENT AND PLAN:

## 2010-07-22 NOTE — Assessment & Plan Note (Signed)
Last EF was 35-40%.  She likely needs another echo.  She had mod TR on last study.  Repeat echo to be ordered.

## 2010-08-05 ENCOUNTER — Ambulatory Visit (HOSPITAL_COMMUNITY): Payer: Medicare Other | Attending: Cardiology | Admitting: Radiology

## 2010-08-05 DIAGNOSIS — R011 Cardiac murmur, unspecified: Secondary | ICD-10-CM | POA: Insufficient documentation

## 2010-08-05 DIAGNOSIS — I340 Nonrheumatic mitral (valve) insufficiency: Secondary | ICD-10-CM

## 2010-08-05 DIAGNOSIS — I079 Rheumatic tricuspid valve disease, unspecified: Secondary | ICD-10-CM | POA: Insufficient documentation

## 2010-08-05 DIAGNOSIS — I2510 Atherosclerotic heart disease of native coronary artery without angina pectoris: Secondary | ICD-10-CM

## 2010-08-05 DIAGNOSIS — I059 Rheumatic mitral valve disease, unspecified: Secondary | ICD-10-CM | POA: Insufficient documentation

## 2010-08-13 ENCOUNTER — Telehealth: Payer: Self-pay | Admitting: Cardiology

## 2010-08-13 NOTE — Telephone Encounter (Signed)
Pt rtn call from [place] to get test results

## 2010-08-13 NOTE — Telephone Encounter (Signed)
Pt aware of echo results ./cy 

## 2010-12-28 ENCOUNTER — Other Ambulatory Visit (HOSPITAL_COMMUNITY): Payer: Self-pay | Admitting: General Surgery

## 2010-12-28 DIAGNOSIS — R1011 Right upper quadrant pain: Secondary | ICD-10-CM

## 2010-12-29 ENCOUNTER — Encounter (HOSPITAL_COMMUNITY)
Admission: RE | Admit: 2010-12-29 | Discharge: 2010-12-29 | Disposition: A | Discharge status: Home / Self Care | Payer: Medicare Other | Source: Ambulatory Visit | Attending: General Surgery | Admitting: General Surgery

## 2010-12-29 ENCOUNTER — Encounter (HOSPITAL_COMMUNITY): Payer: Self-pay

## 2010-12-29 DIAGNOSIS — R1011 Right upper quadrant pain: Secondary | ICD-10-CM

## 2010-12-29 MED ORDER — TECHNETIUM TC 99M MEBROFENIN IV KIT
5.0000 | PACK | Freq: Once | INTRAVENOUS | Status: AC | PRN
  Administered 2010-12-29: 5.2 via INTRAVENOUS

## 2010-12-29 MED ORDER — SINCALIDE 5 MCG IJ SOLR
INTRAMUSCULAR | Status: AC
  Administered 2010-12-29: 1.95 ug
  Filled 2010-12-29: qty 5

## 2011-01-18 ENCOUNTER — Ambulatory Visit (INDEPENDENT_AMBULATORY_CARE_PROVIDER_SITE_OTHER): Payer: Medicare Other | Admitting: Cardiology

## 2011-01-18 ENCOUNTER — Encounter: Payer: Self-pay | Admitting: Cardiology

## 2011-01-18 DIAGNOSIS — I2510 Atherosclerotic heart disease of native coronary artery without angina pectoris: Secondary | ICD-10-CM

## 2011-01-18 DIAGNOSIS — I5042 Chronic combined systolic (congestive) and diastolic (congestive) heart failure: Secondary | ICD-10-CM

## 2011-01-18 DIAGNOSIS — E782 Mixed hyperlipidemia: Secondary | ICD-10-CM

## 2011-01-18 NOTE — Patient Instructions (Signed)
Your physician wants you to follow-up in:  6 months. You will receive a reminder letter in the mail two months in advance. If you don't receive a letter, please call our office to schedule the follow-up appointment.   

## 2011-01-18 NOTE — Progress Notes (Signed)
HPI:  Doing well.  Best she has done is some time.  Mild shortness of breath with activity, but no major issues.  No chest pain.  Foot is bothering her, and has numbness in lateral aspects of both thighs.  Saw Dr. Carloyn Manner, and now seeing Dr. Erling Cruz hopefully.  Current Outpatient Prescriptions  Medication Sig Dispense Refill  . ALPRAZolam (XANAX) 0.5 MG tablet Take 0.5 mg by mouth at bedtime as needed.        [name] aspirin 81 MG tablet Take 81 mg by mouth daily.        [name] atorvastatin (LIPITOR) 20 MG tablet Take 20 mg by mouth daily.        . Calcium Carbonate-Vit D-Min 600-400 MG-UNIT TABS Take 1 tablet by mouth daily.        . clopidogrel (PLAVIX) 75 MG tablet Take 75 mg by mouth daily.        [name] esomeprazole (NEXIUM) 40 MG capsule Take 40 mg by mouth daily before breakfast.        . ezetimibe (ZETIA) 10 MG tablet Take 10 mg by mouth daily.        . fish oil-omega-3 fatty acids 1000 MG capsule Take 2 g by mouth 2 (two) times daily.        [name] FLUoxetine (PROZAC) 20 MG tablet Take 20 mg by mouth daily.        . furosemide (LASIX) 40 MG tablet Take 40 mg by mouth daily.        [name] HYDROcodone-acetaminophen (NORCO) 10-325 MG per tablet Take 1 tablet by mouth every 6 (six) hours as needed.        . isosorbide mononitrate (IMDUR) 30 MG CR tablet Take 30 mg by mouth every morning.        . metoprolol succinate (TOPROL-XL) 25 MG 24 hr tablet Take 25 mg by mouth daily.        . valsartan (DIOVAN) 160 MG tablet Take by mouth. Take 1 1/2 tablet daily        Allergies  Allergen Reactions  . Cefaclor   . Cephalexin   . Penicillins     REACTION: rash-    Past Medical History  Diagnosis Date  . Rapid palpitations   . Coronary artery disease     Status post percutaneous coronary intervention and bare metal stenting  of the left circumflex and subsequent percutaneous transluminal coronary anioplasty and placement of a 3.0 NIR bare metal stent proximal to the previous stent.  . Hypertension   . Hyperlipidemia   .  Obesity   . Gastroesophageal reflux disease   . Depression   . Osteoarthritis   . Status post hysterectomy   . History of pneumonia   . H/O: hysterectomy     Past Surgical History  Procedure Date  . Coronary artery bypass graft May 31,2001    x 3 with a vein graft to the first diagonal, vein graft to the right coronary  artery, and a free left internal mammary  artery to the obtuse marginal   . Cardiac catheterization 2004    left internal mammary artery to the obtuse marginal  was found to be small and thread like.  The two grafts were patent.  The left circumflex had 90% in-stent restenosis and cutting balloon angioplasty was performed followed by placement of a 3.0 x 74mm Taxus drug -eluting stent.    . Cardiac catheterization 2006    There was in-stent restenosis in the left circumflex and  this was treated with cutting balloon angioplasty   . Cardiac catheterization 2008    vein graft to the to the obtuse marginal was patent, although small, left circumflex had 40% in-stent restenosis, ejection fraction 40-45%.  The patient was medically mananged.    Family History  Problem Relation Age of Onset  . Heart attack Mother   . Heart attack Father 41    cause of death  . Coronary artery disease Sister     CABG     History   Social History  . Marital Status: Divorced    Spouse Name: N/A    Number of Children: N/A  . Years of Education: N/A   Occupational History  . Disabled    Social History Main Topics  . Smoking status: Former Smoker -- 0.2 packs/day    Quit date: 07/21/1995  . Smokeless tobacco: Not on file  . Alcohol Use: No  . Drug Use: No  . Sexually Active:    Other Topics Concern  . Not on file   Social History Narrative   Lives in [place] by herself.      ROS: Please see the HPI.  All other systems reviewed and negative.  PHYSICAL EXAM:  BP 148/78  Pulse 67  Resp 18  Ht 5\' 2"  (1.575 m)  Wt 101.334 kg (223 lb 6.4 oz)  BMI 40.86 kg/m2  General:  Well developed, well nourished, in no acute distress. Head:  Normocephalic and atraumatic. Neck: no JVD Lungs: Clear to auscultation and percussion. Heart: Normal S1 and S2.  No murmur, rubs or gallops.  Abdomen:  Normal bowel sounds; soft; non tender; no organomegaly Pulses: Pulses normal in all 4 extremities.  No diminished pulses.  Extremities: No clubbing or cyanosis. No edema. Neurologic: Alert and oriented x 3.  EKG:  NSR.  Nonspecific T wave abnormality  ASSESSMENT AND PLAN:

## 2011-01-18 NOTE — Assessment & Plan Note (Signed)
NO angina.  Remains on DAPT because of Taxus stent used for ISR.  Would continue.

## 2011-01-18 NOTE — Assessment & Plan Note (Signed)
Stable at present.  Last EF 50%.  No sig TR on last echo.

## 2011-01-18 NOTE — Progress Notes (Signed)
Patient ID: Samantha [name], female   DOB: [birth year], 60 y.o.   MRN: [id number]

## 2011-01-18 NOTE — Assessment & Plan Note (Signed)
For labs tomorrow with Dr. Quintin Alto.

## 2011-01-19 ENCOUNTER — Other Ambulatory Visit: Payer: Self-pay | Admitting: Cardiology

## 2011-03-09 DIAGNOSIS — M25579 Pain in unspecified ankle and joints of unspecified foot: Secondary | ICD-10-CM | POA: Diagnosis not present

## 2011-03-17 DIAGNOSIS — R5383 Other fatigue: Secondary | ICD-10-CM | POA: Diagnosis not present

## 2011-03-17 DIAGNOSIS — R6889 Other general symptoms and signs: Secondary | ICD-10-CM | POA: Diagnosis not present

## 2011-03-17 DIAGNOSIS — R5381 Other malaise: Secondary | ICD-10-CM | POA: Diagnosis not present

## 2011-03-17 DIAGNOSIS — G5710 Meralgia paresthetica, unspecified lower limb: Secondary | ICD-10-CM | POA: Diagnosis not present

## 2011-03-17 DIAGNOSIS — G4700 Insomnia, unspecified: Secondary | ICD-10-CM | POA: Diagnosis not present

## 2011-03-17 DIAGNOSIS — D518 Other vitamin B12 deficiency anemias: Secondary | ICD-10-CM | POA: Diagnosis not present

## 2011-03-17 DIAGNOSIS — R413 Other amnesia: Secondary | ICD-10-CM | POA: Diagnosis not present

## 2011-03-22 DIAGNOSIS — Z1211 Encounter for screening for malignant neoplasm of colon: Secondary | ICD-10-CM | POA: Diagnosis not present

## 2011-03-22 DIAGNOSIS — R6889 Other general symptoms and signs: Secondary | ICD-10-CM | POA: Diagnosis not present

## 2011-03-22 DIAGNOSIS — R413 Other amnesia: Secondary | ICD-10-CM | POA: Diagnosis not present

## 2011-03-22 NOTE — H&P (Signed)
NTS SOAP Note  Vital Signs:  Vitals as of: 6/77/3736: Systolic 681: Diastolic 68: Heart Rate 72: Temp 97.76F: Height 48f 2in: Weight 232Lbs 0 Ounces: Pain Level 0: BMI 42  BMI : 42.43 kg/m2  Subjective: This 60Years 731Months old Female presents forneed for screening TCS.  Does have a h/o anemia and thrombocytopenia.  Is on plavix, though coronary stents placed over one year ago.  No family h/o colon carcinoma.  One heme check positive.  Review of Symptoms:  Constitutional:unremarkable Head:unremarkable Eyes:unremarkable Nose/Mouth/Throat:unremarkable Cardiovascular:unremarkable Respiratory:unremarkable Gastrointestinal:unremarkable Genitourinary:unremarkable Musculoskeletal:unremarkable Skin:unremarkable Hematolgic/Lymphatic:unremarkable Allergic/Immunologic:unremarkable   Past Medical History:Reviewed   Past Medical History  Surgical History: hip replacement, back surgery, CABG, shoulder Medical Problems:  Heart problems, High Blood pressure, High cholesterol Allergies: PCN, ceclor, keflex Medications: lipitor, zetia, metoprolol, fluoxetine, furosemide, plavix, isosorbide, ASA 839m nexium, diovan, calcium, xanax   Social History:Reviewed   Social History  Preferred Language: English ([place]) Race:  White Ethnicity: Not Hispanic / Latino Age: [age]ears 4 Months Marital Status:  D Alcohol:  No Recreational drug(s):  No   Smoking Status: Never smoker reviewed on 12/28/2010  Family History:Reviewed   Family History              Father:  Hypertension, Diabetes Type II, Coronary Artery Disease             Mother:  Hypertension, Diabetes Type II, Coronary Artery Disease             Sibling:  Hypertension, Diabetes Type II, Coronary Artery Disease    Objective Information: General:Well appearing, well nourished in no distress. Head:Atraumatic; no masses; no abnormalities Neck:Supple without  lymphadenopathy.  Heart:RRR, no murmur or gallop.  Normal S1, S2.  No S3, S4.  Lungs:CTA bilaterally, no wheezes, rhonchi, rales.  Breathing unlabored. Abdomen:Soft, NT/ND, no HSM, no masses. deferred to procedure  Assessment:Need for screening TCS  Diagnosis &amp; Procedure: DiagnosisCode: V76.51, ProcedureCode: : 59470   Plan: Scheduled for TCS on 03/29/11.  To hold plavix five days prior to procedure.  Patient Education:Alternative treatments to surgery were discussed with patient (and family).Risks and benefits  of procedure were fully explained to the patient (and family) who gave informed consent. Patient/family questions were addressed.  Follow-up:Pending Surgery

## 2011-03-24 ENCOUNTER — Encounter (HOSPITAL_COMMUNITY): Payer: Self-pay | Admitting: Pharmacy Technician

## 2011-03-28 MED ORDER — SODIUM CHLORIDE 0.45 % IV SOLN
Freq: Once | INTRAVENOUS | Status: AC
  Administered 2011-03-29: 1000 mL via INTRAVENOUS

## 2011-03-29 ENCOUNTER — Encounter (HOSPITAL_COMMUNITY)
Admission: RE | Disposition: A | Discharge status: Home / Self Care | Payer: Self-pay | Source: Ambulatory Visit | Attending: General Surgery

## 2011-03-29 ENCOUNTER — Ambulatory Visit (HOSPITAL_COMMUNITY)
Admission: RE | Admit: 2011-03-29 | Discharge: 2011-03-29 | Disposition: A | Discharge status: Home / Self Care | Payer: Medicare Other | Source: Ambulatory Visit | Attending: General Surgery | Admitting: General Surgery

## 2011-03-29 ENCOUNTER — Encounter (HOSPITAL_COMMUNITY): Payer: Self-pay | Admitting: *Deleted

## 2011-03-29 DIAGNOSIS — Z79899 Other long term (current) drug therapy: Secondary | ICD-10-CM | POA: Diagnosis not present

## 2011-03-29 DIAGNOSIS — E7800 Pure hypercholesterolemia, unspecified: Secondary | ICD-10-CM | POA: Diagnosis not present

## 2011-03-29 DIAGNOSIS — Z1211 Encounter for screening for malignant neoplasm of colon: Secondary | ICD-10-CM | POA: Insufficient documentation

## 2011-03-29 DIAGNOSIS — K5730 Diverticulosis of large intestine without perforation or abscess without bleeding: Secondary | ICD-10-CM | POA: Diagnosis not present

## 2011-03-29 DIAGNOSIS — I10 Essential (primary) hypertension: Secondary | ICD-10-CM | POA: Insufficient documentation

## 2011-03-29 DIAGNOSIS — Z951 Presence of aortocoronary bypass graft: Secondary | ICD-10-CM | POA: Insufficient documentation

## 2011-03-29 HISTORY — PX: COLONOSCOPY: SHX5424

## 2011-03-29 SURGERY — COLONOSCOPY
Anesthesia: Moderate Sedation

## 2011-03-29 MED ORDER — MEPERIDINE HCL 25 MG/ML IJ SOLN
INTRAMUSCULAR | Status: DC | PRN
  Administered 2011-03-29: 50 mg via INTRAVENOUS

## 2011-03-29 MED ORDER — MIDAZOLAM HCL 5 MG/5ML IJ SOLN
INTRAMUSCULAR | Status: DC | PRN
  Administered 2011-03-29: 1 mg via INTRAVENOUS
  Administered 2011-03-29: 3 mg via INTRAVENOUS

## 2011-03-29 MED ORDER — STERILE WATER FOR IRRIGATION IR SOLN
Status: DC | PRN
  Administered 2011-03-29: 10:00:00

## 2011-03-29 MED ORDER — MIDAZOLAM HCL 5 MG/5ML IJ SOLN
INTRAMUSCULAR | Status: AC
  Filled 2011-03-29: qty 5

## 2011-03-29 MED ORDER — MEPERIDINE HCL 100 MG/ML IJ SOLN
INTRAMUSCULAR | Status: AC
  Filled 2011-03-29: qty 1

## 2011-03-29 NOTE — Op Note (Signed)
[facility] [address] [place], [place]  [address]  COLONOSCOPY PROCEDURE REPORT  PATIENT:  Samantha [name], Samantha [name]  MR#:  [id number] BIRTHDATE:  1951-01-19, 43 yrs. old  GENDER:  female ENDOSCOPIST:  Aviva Signs, MD REF. BY:  [name], M.D. PROCEDURE DATE:  03/29/2011 PROCEDURE:  Average-risk screening colonoscopy G0121 ASA CLASS:  Class II INDICATIONS:  Screening MEDICATIONS:   Versed 4 mg IV, demerol 50 mg IV  DESCRIPTION OF PROCEDURE:   After the risks benefits and alternatives of the procedure were thoroughly explained, informed consent was obtained.  Digital rectal exam was performed and revealed no abnormalities.   The EC-[id number] ([id number]) endoscope was introduced through the anus and advanced to the cecum, which was identified by both the appendix and ileocecal valve, without limitations.  The quality of the prep was adequate..  The instrument was then slowly withdrawn as the colon was fully examined.  FINDINGS:  Scattered diverticula were found in the sigmoid colon. Retroflexed views in the rectum revealed no abnormalities.  The scope was then withdrawn in  6 minutes from the cecum and the procedure completed. COMPLICATIONS:  None ENDOSCOPIC IMPRESSION: 1) Diverticula, scattered in the sigmoid colon 2) Normal colonoscopy otherwise RECOMMENDATIONS:  REPEAT EXAM:  In 10 year(s) for Colonoscopy.  ______________________________ Aviva Signs, MD  CC:  [name], MD  n. Lorrin MaisAviva Signs at 03/29/2011 10:10 AM  Henrietta Hoover, [phone]

## 2011-03-30 DIAGNOSIS — Z7902 Long term (current) use of antithrombotics/antiplatelets: Secondary | ICD-10-CM | POA: Diagnosis not present

## 2011-03-30 DIAGNOSIS — Z882 Allergy status to sulfonamides status: Secondary | ICD-10-CM | POA: Diagnosis not present

## 2011-03-30 DIAGNOSIS — E7800 Pure hypercholesterolemia, unspecified: Secondary | ICD-10-CM | POA: Diagnosis not present

## 2011-03-30 DIAGNOSIS — R918 Other nonspecific abnormal finding of lung field: Secondary | ICD-10-CM | POA: Diagnosis not present

## 2011-03-30 DIAGNOSIS — Z7982 Long term (current) use of aspirin: Secondary | ICD-10-CM | POA: Diagnosis not present

## 2011-03-30 DIAGNOSIS — R079 Chest pain, unspecified: Secondary | ICD-10-CM | POA: Diagnosis not present

## 2011-03-30 DIAGNOSIS — I509 Heart failure, unspecified: Secondary | ICD-10-CM | POA: Diagnosis not present

## 2011-03-30 DIAGNOSIS — R0989 Other specified symptoms and signs involving the circulatory and respiratory systems: Secondary | ICD-10-CM | POA: Diagnosis not present

## 2011-03-30 DIAGNOSIS — Z79899 Other long term (current) drug therapy: Secondary | ICD-10-CM | POA: Diagnosis not present

## 2011-03-30 DIAGNOSIS — R0789 Other chest pain: Secondary | ICD-10-CM | POA: Diagnosis not present

## 2011-03-30 DIAGNOSIS — Z96649 Presence of unspecified artificial hip joint: Secondary | ICD-10-CM | POA: Diagnosis not present

## 2011-03-30 DIAGNOSIS — K219 Gastro-esophageal reflux disease without esophagitis: Secondary | ICD-10-CM | POA: Diagnosis not present

## 2011-03-30 DIAGNOSIS — F329 Major depressive disorder, single episode, unspecified: Secondary | ICD-10-CM | POA: Diagnosis not present

## 2011-03-30 DIAGNOSIS — Z8249 Family history of ischemic heart disease and other diseases of the circulatory system: Secondary | ICD-10-CM | POA: Diagnosis not present

## 2011-03-30 DIAGNOSIS — I517 Cardiomegaly: Secondary | ICD-10-CM | POA: Diagnosis not present

## 2011-03-30 DIAGNOSIS — Z951 Presence of aortocoronary bypass graft: Secondary | ICD-10-CM | POA: Diagnosis not present

## 2011-03-30 DIAGNOSIS — Z9861 Coronary angioplasty status: Secondary | ICD-10-CM | POA: Diagnosis not present

## 2011-03-30 DIAGNOSIS — Z87891 Personal history of nicotine dependence: Secondary | ICD-10-CM | POA: Diagnosis not present

## 2011-03-30 DIAGNOSIS — I252 Old myocardial infarction: Secondary | ICD-10-CM | POA: Diagnosis not present

## 2011-03-30 DIAGNOSIS — I10 Essential (primary) hypertension: Secondary | ICD-10-CM | POA: Diagnosis not present

## 2011-03-30 DIAGNOSIS — M1990 Unspecified osteoarthritis, unspecified site: Secondary | ICD-10-CM | POA: Diagnosis not present

## 2011-03-30 DIAGNOSIS — J9819 Other pulmonary collapse: Secondary | ICD-10-CM | POA: Diagnosis not present

## 2011-03-30 DIAGNOSIS — F411 Generalized anxiety disorder: Secondary | ICD-10-CM | POA: Diagnosis not present

## 2011-03-30 DIAGNOSIS — Z880 Allergy status to penicillin: Secondary | ICD-10-CM | POA: Diagnosis not present

## 2011-03-30 DIAGNOSIS — G609 Hereditary and idiopathic neuropathy, unspecified: Secondary | ICD-10-CM | POA: Diagnosis not present

## 2011-03-30 DIAGNOSIS — I2510 Atherosclerotic heart disease of native coronary artery without angina pectoris: Secondary | ICD-10-CM | POA: Diagnosis not present

## 2011-03-30 DIAGNOSIS — R0602 Shortness of breath: Secondary | ICD-10-CM | POA: Diagnosis not present

## 2011-03-30 DIAGNOSIS — I428 Other cardiomyopathies: Secondary | ICD-10-CM | POA: Diagnosis not present

## 2011-03-30 DIAGNOSIS — E119 Type 2 diabetes mellitus without complications: Secondary | ICD-10-CM | POA: Diagnosis not present

## 2011-03-30 DIAGNOSIS — I5022 Chronic systolic (congestive) heart failure: Secondary | ICD-10-CM | POA: Diagnosis not present

## 2011-03-31 DIAGNOSIS — R079 Chest pain, unspecified: Secondary | ICD-10-CM | POA: Diagnosis not present

## 2011-04-01 DIAGNOSIS — R079 Chest pain, unspecified: Secondary | ICD-10-CM | POA: Diagnosis not present

## 2011-04-04 ENCOUNTER — Telehealth: Payer: Self-pay | Admitting: *Deleted

## 2011-04-04 ENCOUNTER — Encounter: Payer: Self-pay | Admitting: *Deleted

## 2011-04-04 DIAGNOSIS — R0602 Shortness of breath: Secondary | ICD-10-CM

## 2011-04-04 DIAGNOSIS — I2510 Atherosclerotic heart disease of native coronary artery without angina pectoris: Secondary | ICD-10-CM | POA: Diagnosis not present

## 2011-04-04 LAB — PROTIME-INR

## 2011-04-04 NOTE — Telephone Encounter (Signed)
04/07/11 Arrive 0730 for 0830 [facility] lab, (L) heart cath, Dr. Lia Foyer

## 2011-04-04 NOTE — Telephone Encounter (Signed)
New problem Pt wants to talk to you about cath that is scheduled please call

## 2011-04-04 NOTE — Telephone Encounter (Signed)
Per Gene, pt needs outpt (L) heart cath ([place] lab) with Dr. Lia Foyer this week.  Scheduled for 04/07/11 at 0830, pt to arrive at 0730. Pt needs PT/PTT done as this was not completed at [facility]. Pt will go by the [facility] this afternoon to have this done and then come to the office to pick up her cath instructions.

## 2011-04-04 NOTE — Telephone Encounter (Signed)
No precert required 

## 2011-04-04 NOTE — Telephone Encounter (Signed)
Pt is scheduled for a cath with Dr. Lia Foyer on 04/04/11.  It was ordered by a cardiologist in [place].  Pt wants to make sure Dr. Lia Foyer thinks the cath is necessary.  Please call and advise.

## 2011-04-05 ENCOUNTER — Other Ambulatory Visit: Payer: Self-pay | Admitting: Physician Assistant

## 2011-04-05 ENCOUNTER — Encounter (HOSPITAL_COMMUNITY): Payer: Self-pay | Admitting: General Surgery

## 2011-04-05 NOTE — Telephone Encounter (Signed)
I spoke with Dr Lia Foyer and he is aware that this pt was referred for cardiac catheterization.  I made the pt aware of this information.

## 2011-04-07 ENCOUNTER — Inpatient Hospital Stay (HOSPITAL_BASED_OUTPATIENT_CLINIC_OR_DEPARTMENT_OTHER)
Admission: RE | Admit: 2011-04-07 | Discharge: 2011-04-07 | Disposition: A | Discharge status: Home / Self Care | Payer: Medicare Other | Source: Ambulatory Visit | Attending: Cardiology | Admitting: Cardiology

## 2011-04-07 ENCOUNTER — Encounter (HOSPITAL_BASED_OUTPATIENT_CLINIC_OR_DEPARTMENT_OTHER)
Admission: RE | Disposition: A | Discharge status: Home / Self Care | Payer: Self-pay | Source: Ambulatory Visit | Attending: Cardiology

## 2011-04-07 ENCOUNTER — Encounter (HOSPITAL_BASED_OUTPATIENT_CLINIC_OR_DEPARTMENT_OTHER): Payer: Self-pay | Admitting: Cardiology

## 2011-04-07 ENCOUNTER — Other Ambulatory Visit: Payer: Self-pay | Admitting: Physician Assistant

## 2011-04-07 DIAGNOSIS — R079 Chest pain, unspecified: Secondary | ICD-10-CM | POA: Insufficient documentation

## 2011-04-07 DIAGNOSIS — D649 Anemia, unspecified: Secondary | ICD-10-CM | POA: Insufficient documentation

## 2011-04-07 DIAGNOSIS — I2789 Other specified pulmonary heart diseases: Secondary | ICD-10-CM | POA: Insufficient documentation

## 2011-04-07 DIAGNOSIS — D696 Thrombocytopenia, unspecified: Secondary | ICD-10-CM | POA: Diagnosis not present

## 2011-04-07 DIAGNOSIS — R9439 Abnormal result of other cardiovascular function study: Secondary | ICD-10-CM | POA: Insufficient documentation

## 2011-04-07 DIAGNOSIS — I2510 Atherosclerotic heart disease of native coronary artery without angina pectoris: Secondary | ICD-10-CM | POA: Diagnosis not present

## 2011-04-07 SURGERY — JV LEFT HEART CATHETERIZATION WITH CORONARY ANGIOGRAM
Anesthesia: Moderate Sedation

## 2011-04-07 MED ORDER — ONDANSETRON HCL 4 MG/2ML IJ SOLN: 4.0000 mg | Freq: Four times a day (QID) | INTRAMUSCULAR | Status: DC | PRN

## 2011-04-07 MED ORDER — SODIUM CHLORIDE 0.9 % IJ SOLN: 3.0000 mL | Freq: Two times a day (BID) | INTRAMUSCULAR | Status: DC

## 2011-04-07 MED ORDER — SODIUM CHLORIDE 0.9 % IV SOLN
INTRAVENOUS | Status: DC
  Administered 2011-04-07: 08:00:00 via INTRAVENOUS

## 2011-04-07 MED ORDER — ACETAMINOPHEN 325 MG PO TABS: 650.0000 mg | ORAL_TABLET | ORAL | Status: DC | PRN

## 2011-04-07 MED ORDER — ASPIRIN 81 MG PO CHEW
324.0000 mg | CHEWABLE_TABLET | ORAL | Status: AC
  Administered 2011-04-07: 324 mg via ORAL

## 2011-04-07 MED ORDER — DIAZEPAM 5 MG PO TABS
5.0000 mg | ORAL_TABLET | ORAL | Status: AC
  Administered 2011-04-07: 5 mg via ORAL

## 2011-04-07 MED ORDER — SODIUM CHLORIDE 0.9 % IV SOLN: INTRAVENOUS | Status: AC

## 2011-04-07 NOTE — Op Note (Signed)
Cardiac Catheterization Procedure Note  Name: Samantha [name] MRN: [id number] DOB: Oct 30, 1950  Procedure: Left Heart Cath, Selective Coronary Angiography, LV angiography  Indication: Prior CABG, with prior PCI, chest pain with abnormal nuclear study post clopidogrel withdrawal.    Procedural details: The right groin was prepped, draped, and anesthetized with 1% lidocaine. Using modified Seldinger technique, a 4 French sheath was introduced into the right femoral artery. Standard Judkins catheters were used for coronary angiography and left ventriculography. Catheter exchanges were performed over a guidewire. There were no immediate procedural complications. The patient was transferred to the post catheterization recovery area for further monitoring.  Procedural Findings: Hemodynamics:  AO 146/57 (91) LV 149/22 No gradient on pullback across the av valve   Coronary angiography: Coronary dominance: right  Left mainstem: no significant obstruction  Left anterior descending (LAD): Courses to the cardiac apex.  There is mild irregularity proximally and about 30% narrowing in the distal end of the proximal vessel.  The distal vessel wraps the apex and is free of critical disease with only mild irregularity.  The large diagonal has 50-70% narrowing at the ostium and a large branch vessel, then a smaller branch that has competitive flow from a vein graft.  The flow occurs after the stenosis.    Left circumflex (LCx): The CFX has been previously stented and restented.  The stent sites are widely patent with only about 30-40% smooth narrowing consistent with expected findings.  Distally, there is a small patent OM, a large OM with segmental eccentric plaque proximally that is difficult to grade, but appears less than 50%.  The distal vessel bifurcates and is small with a tiny PDA/PLA combination.    Right coronary artery (RCA): Non dominant vessel with 90% proximal/mid narrowing, and competitive  filling to the AM secondary to a patent graft.  The SVG to the acute marginal branch is widely patent and with good flow.  The SVG to the diagonal is patent and fills this large area well.  The IMA is a free graft arising from the proximal SVG and supplies the distal OM branch, but is small likely due to competitive filling.   Left ventriculography: There is reduced LV systolic function, range about 40-45%.  There is inferobasal akinesis, and moderate MR, as noted previously, likely due to PMD.  Final Conclusions:   1.  Continued patency of SVG to AM, Diag, and distal CFX 2.  Continue patency of the LAD 3.  Continued stent patency in the CFX 4.  Moderate anemia with known B12, and ? Blood loss 5.  Thrombocytopenia of mild/moderate degree. 6.  Known pulmonary hypertension prob secondary to diastolic dysfunction and PMD related MR.    Recommendations:  1.  Continued follow up with heme in [place]. 2.  Follow up with me. 3.  Consideration of stopping plavix due to findings.  Risk of VLST still of potential concern, but must way risks and benefits. 4.  Given stability and all considerations, prob conservative management at this point.   5.  Treatment of hypertension and related causes.       Bing Quarry 04/07/2011, 9:30 AM

## 2011-04-07 NOTE — Progress Notes (Signed)
Previous note was done in error, bedrest begins @ 0945.

## 2011-04-07 NOTE — Progress Notes (Signed)
Discharge instructions completed, ambulated to bathroom without bleeding from right groin site, discharged to home via wheelchair with friend.

## 2011-04-07 NOTE — Interval H&P Note (Signed)
History and Physical Interval Note:  04/07/2011 8:45 AM  Samantha [name]  has presented today for surgery, with the diagnosis of Chest Pain  The various methods of treatment have been discussed with the patient and family. After consideration of risks, benefits and other options for treatment, the patient has consented to  Procedure(s): [place] as a surgical intervention .  The patients' history has been reviewed, patient examined, no change in status, stable for surgery.  I have reviewed the patients' chart and labs.  Questions were answered to the patient's satisfaction.     Samantha [name]

## 2011-04-07 NOTE — H&P (Signed)
Patient well known to me.  CABG 2001 by PVT with SVG to AM, and diag/OM combined graft with free IMA.  Had prior stenting of the CFX with ISR, followed by stenting with first gen DES to OM.  Restudy in 2010 by me consistent with pulm HTN, mild LV dysfunction, and patent grafts and artery without change.  She has been doing well.  Noted recent anemia, plavix held, and then colon done at [facility] as noted.  Admitted to [facility] hospital after with chest pain.  Enzymes neg.  Nuc abnormal with ischemia both inferolateral and anterolateral.  EF 46%.  Seen by Dr. Fletcher Anon.  See uploaded consult note.  Referred for repeat cath procedure.    Allergies   Allergen  Reactions   .  Cefaclor    .  Cephalexin    .  Penicillins      REACTION: rash-    Past Medical History   Diagnosis  Date   .  Rapid palpitations    .  Coronary artery disease      Status post percutaneous coronary intervention and bare metal stenting of the left circumflex and subsequent percutaneous transluminal coronary anioplasty and placement of a 3.0 NIR bare metal stent proximal to the previous stent.   .  Hypertension    .  Hyperlipidemia    .  Obesity    .  Gastroesophageal reflux disease    .  Depression    .  Osteoarthritis    .  Status post hysterectomy    .  History of pneumonia    .  H/O: hysterectomy     Past Surgical History   Procedure  Date   .  Coronary artery bypass graft  May 31,2001     x 3 with a vein graft to the first diagonal, vein graft to the right coronary artery, and a free left internal mammary artery to the obtuse marginal   .  Cardiac catheterization  2004     left internal mammary artery to the obtuse marginal was found to be small and thread like. The two grafts were patent. The left circumflex had 90% in-stent restenosis and cutting balloon angioplasty was performed followed by placement of a 3.0 x 73mm Taxus drug -eluting stent.   .  Cardiac catheterization  2006     There was in-stent restenosis in the  left circumflex and this was treated with cutting balloon angioplasty   .  Cardiac catheterization  2008     vein graft to the to the obtuse marginal was patent, although small, left circumflex had 40% in-stent restenosis, ejection fraction 40-45%. The patient was medically mananged.    Family History   Problem  Relation  Age of Onset   .  Heart attack  Mother    .  Heart attack  Father  95      cause of death    .  Coronary artery disease  Sister       CABG    History    Social History   .  Marital Status:  Divorced     Spouse Name:  N/A     Number of Children:  N/A   .  Years of Education:  N/A    Occupational History   .  Disabled     Social History Main Topics   .  Smoking status:  Former Smoker -- 0.2 packs/day     Quit date:  07/21/1995   .  Smokeless tobacco:  Not on file   .  Alcohol Use:  No   .  Drug Use:  No   .  Sexually Active:     Other Topics  Concern   .  Not on file    Social History Narrative    Lives in [place] by herself.    ROS:  Please see the HPI. All other systems reviewed and negative.  PHYSICAL EXAM:  Alert and oriented in no distress. BP 155/80 P71 Sat 99% afebrile JVD not increased Lungs clear. Cor---cannot feel PMI.  No def apical murmur Abd-no masses Ext--no edema  Labs reviewed.   Impression:  1.  CAD with prior CABG and PCI as outlined-----abnormal nuclear study with multizone ischemia-----indications, risks and options discussed with patient.  If CFX is open, would likely lean for dc plavix.  This has been continued largely because of use of stent for ISR with restenosis with first gen stent.  She understands. 2.  Pul HTN  --  Combination cause--see notes and prior cath 3.  Neg OSA study per patient 4.  Pos stool with colon done without clear cut source.     Plan  Cath today.  She is agreeable.  Dr. Fletcher Anon and I have both discussed and I discussed with him last week.     Bing Quarry 8:45 AM] 04/07/2011

## 2011-04-07 NOTE — Progress Notes (Signed)
Bedrest begins @ 0845, Dr. Lia Foyer in to discuss results with patient and friend.

## 2011-04-22 ENCOUNTER — Ambulatory Visit (INDEPENDENT_AMBULATORY_CARE_PROVIDER_SITE_OTHER): Payer: Medicare Other | Admitting: Cardiology

## 2011-04-22 ENCOUNTER — Encounter: Payer: Self-pay | Admitting: Cardiology

## 2011-04-22 VITALS — BP 120/74 | HR 69 | Ht 62.0 in | Wt 230.0 lb

## 2011-04-22 DIAGNOSIS — I2510 Atherosclerotic heart disease of native coronary artery without angina pectoris: Secondary | ICD-10-CM

## 2011-04-22 DIAGNOSIS — D649 Anemia, unspecified: Secondary | ICD-10-CM | POA: Insufficient documentation

## 2011-04-22 NOTE — Assessment & Plan Note (Signed)
She has moderate anemia and thrombocytopenia.  Question B12.  She is anemic.  May need BM.

## 2011-04-22 NOTE — Assessment & Plan Note (Signed)
BP well controlled.

## 2011-04-22 NOTE — Progress Notes (Signed)
HPI:  She is in for follow up.  Overall she is stable.  She underwent cardiac cath which demonstrated a patent grafts and stent site.  There was some diag disease.  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  I do not know her levels.  However, I think she would benefit from possible bone marrow.  No current chest pain.    Current Outpatient Prescriptions  Medication Sig Dispense Refill  . ALPRAZolam (XANAX) 0.5 MG tablet Take 0.5 mg by mouth 3 (three) times daily as needed. For anxiety      . aspirin EC 81 MG tablet Take 81 mg by mouth daily.      [name] atorvastatin (LIPITOR) 20 MG tablet Take 20 mg by mouth daily.        . Calcium Carbonate-Vit D-Min 600-400 MG-UNIT TABS Take 2 tablets by mouth daily.       . clopidogrel (PLAVIX) 75 MG tablet Take 75 mg by mouth daily.        . Cyanocobalamin (VITAMIN B-12 PO) Take 1 tablet by mouth daily.      [name] esomeprazole (NEXIUM) 40 MG capsule Take 40 mg by mouth daily before breakfast.        . ezetimibe (ZETIA) 10 MG tablet Take 10 mg by mouth daily.        . fish oil-omega-3 fatty acids 1000 MG capsule Take 2 g by mouth daily.       [name] FLUoxetine (PROZAC) 20 MG tablet Take 20 mg by mouth daily.        . furosemide (LASIX) 40 MG tablet Take 40 mg by mouth daily.        [name] HYDROcodone-acetaminophen (NORCO) 10-325 MG per tablet Take 1 tablet by mouth every 6 (six) hours as needed. For pain.      . isosorbide mononitrate (IMDUR) 30 MG CR tablet Take 30 mg by mouth every morning.        . metoprolol succinate (TOPROL-XL) 25 MG 24 hr tablet Take 25 mg by mouth daily.        . pregabalin (LYRICA) 75 MG capsule Take 75 mg by mouth 2 (two) times daily.      . valsartan (DIOVAN) 160 MG tablet Take 240 mg by mouth daily.         Allergies  Allergen Reactions  . Cefaclor Itching and Rash  . Cephalexin Itching and Rash  . Penicillins Rash    Past Medical History  Diagnosis Date  .  Rapid palpitations   . Coronary artery disease     Status post percutaneous coronary intervention and bare metal stenting  of the left circumflex and subsequent percutaneous transluminal coronary anioplasty and placement of a 3.0 NIR bare metal stent proximal to the previous stent.  . Hypertension   . Hyperlipidemia   . Obesity   . Gastroesophageal reflux disease   . Depression   . Osteoarthritis   . Status post hysterectomy   . History of pneumonia   . H/O: hysterectomy     Past Surgical History  Procedure Date  . Coronary artery bypass graft May 31,2001    x 3 with a vein graft to the first diagonal, vein graft to the right coronary  artery, and a free left internal mammary  artery to the obtuse marginal   . Cardiac catheterization 2004    left internal mammary artery to the obtuse marginal  was found to be small and thread like.  The two grafts were patent.  The left circumflex had 90% in-stent restenosis and cutting balloon angioplasty was performed followed by placement of a 3.0 x 61m Taxus drug -eluting stent.    . Cardiac catheterization 2006    There was in-stent restenosis in the left circumflex and this was treated with cutting balloon angioplasty   . Cardiac catheterization 2008    vein graft to the to the obtuse marginal was patent, although small, left circumflex had 40% in-stent restenosis, ejection fraction 40-45%.  The patient was medically mananged.  . Tonsillectomy   . Joint replacement   . Rotator cuff left 2007  . Back surgery   . Abdominal hysterectomy   . Colonoscopy 03/29/2011    Procedure: COLONOSCOPY;  Surgeon: MJamesetta So MD;  Location: [facility];  Service: Gastroenterology;  Laterality: N/A;    Family History  Problem Relation Age of Onset  . Heart attack Mother   . Heart attack Father 788   cause of death  . Coronary artery disease Sister     CABG     History   Social History  . Marital Status: Divorced    Spouse Name: N/A    Number  of Children: N/A  . Years of Education: N/A   Occupational History  . Disabled    Social History Main Topics  . Smoking status: Former Smoker -- 0.2 packs/day    Quit date: 07/21/1995  . Smokeless tobacco: Not on file  . Alcohol Use: No  . Drug Use: No  . Sexually Active:    Other Topics Concern  . Not on file   Social History Narrative   [place]      ROS: Please see the HPI.  All other systems reviewed and negative.  PHYSICAL EXAM:  BP 120/74  Pulse 69  Ht 5' 2"  (1.575 m)  Wt 230 lb (104.327 kg)  BMI 42.07 kg/m2  LMP 03/29/2011  General: Well developed, well nourished, in no acute distress. Head:  Normocephalic and atraumatic. Neck: no JVD Lungs: Clear to auscultation and percussion. Heart: Normal S1 and S2. Soft apical murmur Abdomen:  Normal bowel sounds; soft; non tender; no organomegaly Pulses: Pulses normal in all 4 extremities.  Cath site ok.   Extremities: No clubbing or cyanosis. No edema. Neurologic: Alert and oriented x 3.  EKG:  SNR.  Nonspecific T wave flattening.    ASSESSMENT AND PLAN:

## 2011-04-22 NOTE — Patient Instructions (Addendum)
Your physician recommends that you continue on your current medications as directed. Please refer to the Current Medication list given to you today.  Your physician recommends that you schedule a follow-up appointment in: 2 MONTHS  You have been referred to the [facility] in [place] for thrombocytopenia and anemia--Dr Darovsky 04/26/11 arrive at 11:00

## 2011-04-22 NOTE — Assessment & Plan Note (Signed)
Currently stable.  Would continue current meds.  She does have an inferior WMA, and some mod MR which is due to PMD.  Would continue to follow.

## 2011-04-26 DIAGNOSIS — E669 Obesity, unspecified: Secondary | ICD-10-CM | POA: Diagnosis not present

## 2011-04-26 DIAGNOSIS — Z7902 Long term (current) use of antithrombotics/antiplatelets: Secondary | ICD-10-CM | POA: Diagnosis not present

## 2011-04-26 DIAGNOSIS — R161 Splenomegaly, not elsewhere classified: Secondary | ICD-10-CM | POA: Diagnosis not present

## 2011-04-26 DIAGNOSIS — Z79899 Other long term (current) drug therapy: Secondary | ICD-10-CM | POA: Diagnosis not present

## 2011-04-26 DIAGNOSIS — K7689 Other specified diseases of liver: Secondary | ICD-10-CM | POA: Diagnosis not present

## 2011-04-26 DIAGNOSIS — E538 Deficiency of other specified B group vitamins: Secondary | ICD-10-CM | POA: Diagnosis not present

## 2011-04-26 DIAGNOSIS — Z7982 Long term (current) use of aspirin: Secondary | ICD-10-CM | POA: Diagnosis not present

## 2011-04-26 DIAGNOSIS — E119 Type 2 diabetes mellitus without complications: Secondary | ICD-10-CM | POA: Diagnosis not present

## 2011-04-26 DIAGNOSIS — R5383 Other fatigue: Secondary | ICD-10-CM | POA: Diagnosis not present

## 2011-04-26 DIAGNOSIS — I2510 Atherosclerotic heart disease of native coronary artery without angina pectoris: Secondary | ICD-10-CM | POA: Diagnosis not present

## 2011-04-26 DIAGNOSIS — E785 Hyperlipidemia, unspecified: Secondary | ICD-10-CM | POA: Diagnosis not present

## 2011-04-26 DIAGNOSIS — D638 Anemia in other chronic diseases classified elsewhere: Secondary | ICD-10-CM | POA: Diagnosis not present

## 2011-04-26 DIAGNOSIS — Z6839 Body mass index (BMI) 39.0-39.9, adult: Secondary | ICD-10-CM | POA: Diagnosis not present

## 2011-04-26 DIAGNOSIS — I10 Essential (primary) hypertension: Secondary | ICD-10-CM | POA: Diagnosis not present

## 2011-04-26 DIAGNOSIS — D696 Thrombocytopenia, unspecified: Secondary | ICD-10-CM | POA: Diagnosis not present

## 2011-04-26 DIAGNOSIS — G589 Mononeuropathy, unspecified: Secondary | ICD-10-CM | POA: Diagnosis not present

## 2011-04-26 DIAGNOSIS — R5381 Other malaise: Secondary | ICD-10-CM | POA: Diagnosis not present

## 2011-04-27 DIAGNOSIS — I2510 Atherosclerotic heart disease of native coronary artery without angina pectoris: Secondary | ICD-10-CM | POA: Diagnosis not present

## 2011-04-27 DIAGNOSIS — D696 Thrombocytopenia, unspecified: Secondary | ICD-10-CM | POA: Diagnosis not present

## 2011-04-27 DIAGNOSIS — K7689 Other specified diseases of liver: Secondary | ICD-10-CM | POA: Diagnosis not present

## 2011-04-27 DIAGNOSIS — D638 Anemia in other chronic diseases classified elsewhere: Secondary | ICD-10-CM | POA: Diagnosis not present

## 2011-04-27 DIAGNOSIS — E119 Type 2 diabetes mellitus without complications: Secondary | ICD-10-CM | POA: Diagnosis not present

## 2011-04-27 DIAGNOSIS — R161 Splenomegaly, not elsewhere classified: Secondary | ICD-10-CM | POA: Diagnosis not present

## 2011-04-28 DIAGNOSIS — E785 Hyperlipidemia, unspecified: Secondary | ICD-10-CM | POA: Diagnosis not present

## 2011-04-28 DIAGNOSIS — K7689 Other specified diseases of liver: Secondary | ICD-10-CM | POA: Diagnosis not present

## 2011-04-28 DIAGNOSIS — D638 Anemia in other chronic diseases classified elsewhere: Secondary | ICD-10-CM | POA: Diagnosis not present

## 2011-04-28 DIAGNOSIS — R161 Splenomegaly, not elsewhere classified: Secondary | ICD-10-CM | POA: Diagnosis not present

## 2011-04-28 DIAGNOSIS — D696 Thrombocytopenia, unspecified: Secondary | ICD-10-CM | POA: Diagnosis not present

## 2011-04-28 DIAGNOSIS — I2510 Atherosclerotic heart disease of native coronary artery without angina pectoris: Secondary | ICD-10-CM | POA: Diagnosis not present

## 2011-04-28 DIAGNOSIS — I10 Essential (primary) hypertension: Secondary | ICD-10-CM | POA: Diagnosis not present

## 2011-04-28 DIAGNOSIS — E119 Type 2 diabetes mellitus without complications: Secondary | ICD-10-CM | POA: Diagnosis not present

## 2011-05-19 DIAGNOSIS — E119 Type 2 diabetes mellitus without complications: Secondary | ICD-10-CM | POA: Diagnosis not present

## 2011-05-19 DIAGNOSIS — E7800 Pure hypercholesterolemia, unspecified: Secondary | ICD-10-CM | POA: Diagnosis not present

## 2011-05-19 DIAGNOSIS — I10 Essential (primary) hypertension: Secondary | ICD-10-CM | POA: Diagnosis not present

## 2011-05-23 DIAGNOSIS — Z1231 Encounter for screening mammogram for malignant neoplasm of breast: Secondary | ICD-10-CM | POA: Diagnosis not present

## 2011-05-26 DIAGNOSIS — E119 Type 2 diabetes mellitus without complications: Secondary | ICD-10-CM | POA: Diagnosis not present

## 2011-05-26 DIAGNOSIS — I209 Angina pectoris, unspecified: Secondary | ICD-10-CM | POA: Diagnosis not present

## 2011-05-26 DIAGNOSIS — I10 Essential (primary) hypertension: Secondary | ICD-10-CM | POA: Diagnosis not present

## 2011-05-26 DIAGNOSIS — E7800 Pure hypercholesterolemia, unspecified: Secondary | ICD-10-CM | POA: Diagnosis not present

## 2011-05-26 DIAGNOSIS — G619 Inflammatory polyneuropathy, unspecified: Secondary | ICD-10-CM | POA: Diagnosis not present

## 2011-05-26 DIAGNOSIS — M545 Low back pain, unspecified: Secondary | ICD-10-CM | POA: Diagnosis not present

## 2011-05-26 DIAGNOSIS — F411 Generalized anxiety disorder: Secondary | ICD-10-CM | POA: Diagnosis not present

## 2011-05-26 DIAGNOSIS — E669 Obesity, unspecified: Secondary | ICD-10-CM | POA: Diagnosis not present

## 2011-06-13 DIAGNOSIS — H35079 Retinal telangiectasis, unspecified eye: Secondary | ICD-10-CM | POA: Diagnosis not present

## 2011-06-13 DIAGNOSIS — H524 Presbyopia: Secondary | ICD-10-CM | POA: Diagnosis not present

## 2011-06-13 DIAGNOSIS — H2510 Age-related nuclear cataract, unspecified eye: Secondary | ICD-10-CM | POA: Diagnosis not present

## 2011-06-13 DIAGNOSIS — E119 Type 2 diabetes mellitus without complications: Secondary | ICD-10-CM | POA: Diagnosis not present

## 2011-06-15 DIAGNOSIS — G5710 Meralgia paresthetica, unspecified lower limb: Secondary | ICD-10-CM | POA: Diagnosis not present

## 2011-06-15 DIAGNOSIS — G4700 Insomnia, unspecified: Secondary | ICD-10-CM | POA: Diagnosis not present

## 2011-06-15 DIAGNOSIS — R413 Other amnesia: Secondary | ICD-10-CM | POA: Diagnosis not present

## 2011-06-22 DIAGNOSIS — M722 Plantar fascial fibromatosis: Secondary | ICD-10-CM | POA: Diagnosis not present

## 2011-06-23 ENCOUNTER — Ambulatory Visit (INDEPENDENT_AMBULATORY_CARE_PROVIDER_SITE_OTHER): Payer: Medicare Other | Admitting: Cardiology

## 2011-06-23 ENCOUNTER — Encounter: Payer: Self-pay | Admitting: Cardiology

## 2011-06-23 VITALS — BP 132/70 | HR 72 | Ht 62.0 in | Wt 231.0 lb

## 2011-06-23 DIAGNOSIS — IMO0002 Reserved for concepts with insufficient information to code with codable children: Secondary | ICD-10-CM

## 2011-06-23 DIAGNOSIS — I2510 Atherosclerotic heart disease of native coronary artery without angina pectoris: Secondary | ICD-10-CM

## 2011-06-23 DIAGNOSIS — E782 Mixed hyperlipidemia: Secondary | ICD-10-CM

## 2011-06-23 NOTE — Patient Instructions (Signed)
Your physician recommends that you schedule a follow-up appointment in: [place] physician recommends that you continue on your current medications as directed. Please refer to the Current Medication list given to you today.

## 2011-06-23 NOTE — Progress Notes (Signed)
HPI:  Patient stable at present time.  She is not having chest pain.  She is to have foot surgery, as she is having pain every day.  Dr. Jannifer Franklin gave her Lyrica, and she became swollen, and much worse.  Overall, stable at present.   She underwent cath in February, so no new studies are needed.  This is going to be done [facility].    Current Outpatient Prescriptions  Medication Sig Dispense Refill  . ALPRAZolam (XANAX) 0.5 MG tablet Take 0.5 mg by mouth 3 (three) times daily as needed. For anxiety      . aspirin EC 81 MG tablet Take 81 mg by mouth daily.      [name] atorvastatin (LIPITOR) 20 MG tablet Take 20 mg by mouth daily.        . Calcium Carbonate-Vit D-Min 600-400 MG-UNIT TABS Take 2 tablets by mouth daily.       . clopidogrel (PLAVIX) 75 MG tablet Take 75 mg by mouth daily.        . Cyanocobalamin (VITAMIN B-12 PO) Take 1 tablet by mouth daily.      [name] esomeprazole (NEXIUM) 40 MG capsule Take 40 mg by mouth daily before breakfast.        . ezetimibe (ZETIA) 10 MG tablet Take 10 mg by mouth daily.        . fish oil-omega-3 fatty acids 1000 MG capsule Take 1,200 g by mouth daily.       [name] FLUoxetine (PROZAC) 20 MG tablet Take 20 mg by mouth daily.        . furosemide (LASIX) 40 MG tablet Take 40 mg by mouth daily.        [name] HYDROcodone-acetaminophen (NORCO) 10-325 MG per tablet Take 1 tablet by mouth every 6 (six) hours as needed. For pain.      . isosorbide mononitrate (IMDUR) 30 MG CR tablet Take 30 mg by mouth every morning.        . metoprolol succinate (TOPROL-XL) 25 MG 24 hr tablet Take 25 mg by mouth daily.        . valsartan (DIOVAN) 160 MG tablet Take 240 mg by mouth daily.         Allergies  Allergen Reactions  . Lyrica     Retains fluid  . Cefaclor Itching and Rash  . Cephalexin Itching and Rash  . Penicillins Rash    Past Medical History  Diagnosis Date  . Rapid palpitations   . Coronary artery disease     Status post percutaneous coronary intervention and  bare metal stenting  of the left circumflex and subsequent percutaneous transluminal coronary anioplasty and placement of a 3.0 NIR bare metal stent proximal to the previous stent.  . Hypertension   . Hyperlipidemia   . Obesity   . Gastroesophageal reflux disease   . Depression   . Osteoarthritis   . Status post hysterectomy   . History of pneumonia   . H/O: hysterectomy     Past Surgical History  Procedure Date  . Coronary artery bypass graft May 31,2001    x 3 with a vein graft to the first diagonal, vein graft to the right coronary  artery, and a free left internal mammary  artery to the obtuse marginal   . Cardiac catheterization 2004    left internal mammary artery to the obtuse marginal  was found to be small and thread like.  The two grafts were patent.  The left  circumflex had 90% in-stent restenosis and cutting balloon angioplasty was performed followed by placement of a 3.0 x 17m Taxus drug -eluting stent.    . Cardiac catheterization 2006    There was in-stent restenosis in the left circumflex and this was treated with cutting balloon angioplasty   . Cardiac catheterization 2008    vein graft to the to the obtuse marginal was patent, although small, left circumflex had 40% in-stent restenosis, ejection fraction 40-45%.  The patient was medically mananged.  . Tonsillectomy   . Joint replacement   . Rotator cuff left 2007  . Back surgery   . Abdominal hysterectomy   . Colonoscopy 03/29/2011    Procedure: COLONOSCOPY;  Surgeon: MJamesetta So MD;  [facility];  Service: Gastroenterology;  Laterality: N/A;    Family History  Problem Relation Age of Onset  . Heart attack Mother   . Heart attack Father 79   cause of death  . Coronary artery disease Sister     CABG     History   Social History  . Marital Status: Divorced    Spouse Name: N/A    Number of Children: N/A  . Years of Education: N/A   Occupational History  . Disabled    Social History  Main Topics  . Smoking status: Former Smoker -- 0.2 packs/day    Quit date: 07/21/1995  . Smokeless tobacco: Not on file  . Alcohol Use: No  . Drug Use: No  . Sexually Active:    Other Topics Concern  . Not on file   Social History Narrative   [place]      ROS: Please see the HPI.  All other systems reviewed and negative.  PHYSICAL EXAM:  BP 132/70  Pulse 72  Ht 5' 2"  (1.575 m)  Wt 231 lb (104.781 kg)  BMI 42.25 kg/m2  LMP 03/29/2011  General: Well developed, well nourished, in no acute distress. Head:  Normocephalic and atraumatic. Neck: no JVD Lungs: Clear to auscultation and percussion. Heart: Normal S1 and S2.  Soft apical murmur.   Abdomen:  Normal bowel sounds; soft; non tender; no organomegaly Pulses: Pulses normal in all 4 extremities. Extremities: No clubbing or cyanosis. No edema. Neurologic: Alert and oriented x 3.  EKG:  Cath 04/2011 Procedural Findings:  Hemodynamics:  AO 146/57 (91)  LV 149/22  No gradient on pullback across the av valve  Coronary angiography:  Coronary dominance: right  Left mainstem: no significant obstruction  Left anterior descending (LAD): Courses to the cardiac apex. There is mild irregularity proximally and about 30% narrowing in the distal end of the proximal vessel. The distal vessel wraps the apex and is free of critical disease with only mild irregularity. The large diagonal has 50-70% narrowing at the ostium and a large branch vessel, then a smaller branch that has competitive flow from a vein graft. The flow occurs after the stenosis.  Left circumflex (LCx): The CFX has been previously stented and restented. The stent sites are widely patent with only about 30-40% smooth narrowing consistent with expected findings. Distally, there is a small patent OM, a large OM with segmental eccentric plaque proximally that is difficult to grade, but appears less than 50%. The distal vessel bifurcates and is small with a  tiny PDA/PLA combination.  Right coronary artery (RCA): Non dominant vessel with 90% proximal/mid narrowing, and competitive filling to the AM secondary to a patent graft.  The SVG to the  acute marginal branch is widely patent and with good flow.  The SVG to the diagonal is patent and fills this large area well. The IMA is a free graft arising from the proximal SVG and supplies the distal OM branch, but is small likely due to competitive filling.  Left ventriculography: There is reduced LV systolic function, range about 40-45%. There is inferobasal akinesis, and moderate MR, as noted previously, likely due to PMD.  Final Conclusions:  1. Continued patency of SVG to AM, Diag, and distal CFX  2. Continue patency of the LAD  3. Continued stent patency in the CFX  4. Moderate anemia with known B12, and ? Blood loss  5. Thrombocytopenia of mild/moderate degree.  6. Known pulmonary hypertension prob secondary to diastolic dysfunction and PMD related MR.  Recommendations:  1. Continued follow up with heme in [place].  2. Follow up with me.  3. Consideration of stopping plavix due to findings. Risk of VLST still of potential concern, but must way risks and benefits.  4. Given stability and all considerations, prob conservative management at this point.  5. Treatment of hypertension and related causes.  Bing Quarry  04/07/2011, 9:30 AM    ASSESSMENT AND PLAN:

## 2011-06-23 NOTE — Assessment & Plan Note (Signed)
She is stable.  The most recent cath study is demonstrated.  She can come off of clopidogrel for 5 days or more.  Because of her first generation DES, she should remain on ASA and is aware of this.  A lateral incision is planned per the patient.  No further workup is indicated.

## 2011-06-23 NOTE — Assessment & Plan Note (Signed)
Stable at present.

## 2011-06-23 NOTE — Assessment & Plan Note (Signed)
Followed in [place] for combined therapy.

## 2011-06-28 DIAGNOSIS — Z01818 Encounter for other preprocedural examination: Secondary | ICD-10-CM | POA: Diagnosis not present

## 2011-06-30 DIAGNOSIS — M722 Plantar fascial fibromatosis: Secondary | ICD-10-CM | POA: Diagnosis not present

## 2011-07-06 DIAGNOSIS — M722 Plantar fascial fibromatosis: Secondary | ICD-10-CM | POA: Diagnosis not present

## 2011-07-18 DIAGNOSIS — M810 Age-related osteoporosis without current pathological fracture: Secondary | ICD-10-CM | POA: Diagnosis not present

## 2011-07-18 DIAGNOSIS — M25569 Pain in unspecified knee: Secondary | ICD-10-CM | POA: Diagnosis not present

## 2011-07-20 DIAGNOSIS — M810 Age-related osteoporosis without current pathological fracture: Secondary | ICD-10-CM | POA: Diagnosis not present

## 2011-07-20 DIAGNOSIS — E559 Vitamin D deficiency, unspecified: Secondary | ICD-10-CM | POA: Diagnosis not present

## 2011-07-21 DIAGNOSIS — Z96649 Presence of unspecified artificial hip joint: Secondary | ICD-10-CM | POA: Diagnosis not present

## 2011-07-21 DIAGNOSIS — M810 Age-related osteoporosis without current pathological fracture: Secondary | ICD-10-CM | POA: Diagnosis not present

## 2011-07-21 DIAGNOSIS — M949 Disorder of cartilage, unspecified: Secondary | ICD-10-CM | POA: Diagnosis not present

## 2011-07-21 DIAGNOSIS — M899 Disorder of bone, unspecified: Secondary | ICD-10-CM | POA: Diagnosis not present

## 2011-07-21 DIAGNOSIS — Z87891 Personal history of nicotine dependence: Secondary | ICD-10-CM | POA: Diagnosis not present

## 2011-07-21 DIAGNOSIS — I10 Essential (primary) hypertension: Secondary | ICD-10-CM | POA: Diagnosis not present

## 2011-07-21 DIAGNOSIS — Z780 Asymptomatic menopausal state: Secondary | ICD-10-CM | POA: Diagnosis not present

## 2011-07-21 DIAGNOSIS — M549 Dorsalgia, unspecified: Secondary | ICD-10-CM | POA: Diagnosis not present

## 2011-08-15 DIAGNOSIS — M899 Disorder of bone, unspecified: Secondary | ICD-10-CM | POA: Diagnosis not present

## 2011-09-15 DIAGNOSIS — S60219A Contusion of unspecified wrist, initial encounter: Secondary | ICD-10-CM | POA: Diagnosis not present

## 2011-09-21 DIAGNOSIS — M25539 Pain in unspecified wrist: Secondary | ICD-10-CM | POA: Diagnosis not present

## 2011-09-29 DIAGNOSIS — M545 Low back pain, unspecified: Secondary | ICD-10-CM | POA: Diagnosis not present

## 2011-09-29 DIAGNOSIS — E119 Type 2 diabetes mellitus without complications: Secondary | ICD-10-CM | POA: Diagnosis not present

## 2011-09-29 DIAGNOSIS — I10 Essential (primary) hypertension: Secondary | ICD-10-CM | POA: Diagnosis not present

## 2011-09-29 DIAGNOSIS — E7800 Pure hypercholesterolemia, unspecified: Secondary | ICD-10-CM | POA: Diagnosis not present

## 2011-10-05 DIAGNOSIS — I10 Essential (primary) hypertension: Secondary | ICD-10-CM | POA: Diagnosis not present

## 2011-10-05 DIAGNOSIS — E7800 Pure hypercholesterolemia, unspecified: Secondary | ICD-10-CM | POA: Diagnosis not present

## 2011-10-05 DIAGNOSIS — G619 Inflammatory polyneuropathy, unspecified: Secondary | ICD-10-CM | POA: Diagnosis not present

## 2011-10-05 DIAGNOSIS — M25539 Pain in unspecified wrist: Secondary | ICD-10-CM | POA: Diagnosis not present

## 2011-10-05 DIAGNOSIS — I209 Angina pectoris, unspecified: Secondary | ICD-10-CM | POA: Diagnosis not present

## 2011-10-05 DIAGNOSIS — K2100 Gastro-esophageal reflux disease with esophagitis, without bleeding: Secondary | ICD-10-CM | POA: Diagnosis not present

## 2011-10-05 DIAGNOSIS — F411 Generalized anxiety disorder: Secondary | ICD-10-CM | POA: Diagnosis not present

## 2011-10-05 DIAGNOSIS — D61818 Other pancytopenia: Secondary | ICD-10-CM | POA: Diagnosis not present

## 2011-10-05 DIAGNOSIS — E119 Type 2 diabetes mellitus without complications: Secondary | ICD-10-CM | POA: Diagnosis not present

## 2011-10-17 DIAGNOSIS — I2510 Atherosclerotic heart disease of native coronary artery without angina pectoris: Secondary | ICD-10-CM | POA: Diagnosis not present

## 2011-10-17 DIAGNOSIS — D649 Anemia, unspecified: Secondary | ICD-10-CM | POA: Diagnosis not present

## 2011-10-17 DIAGNOSIS — K7689 Other specified diseases of liver: Secondary | ICD-10-CM | POA: Diagnosis not present

## 2011-10-17 DIAGNOSIS — R161 Splenomegaly, not elsewhere classified: Secondary | ICD-10-CM | POA: Diagnosis not present

## 2011-10-17 DIAGNOSIS — D696 Thrombocytopenia, unspecified: Secondary | ICD-10-CM | POA: Diagnosis not present

## 2011-10-18 ENCOUNTER — Encounter: Payer: Medicare Other | Admitting: Internal Medicine

## 2011-10-18 DIAGNOSIS — D649 Anemia, unspecified: Secondary | ICD-10-CM

## 2011-10-18 DIAGNOSIS — D696 Thrombocytopenia, unspecified: Secondary | ICD-10-CM

## 2011-10-28 ENCOUNTER — Ambulatory Visit: Payer: Medicare Other | Admitting: Cardiology

## 2011-11-02 DIAGNOSIS — Z23 Encounter for immunization: Secondary | ICD-10-CM | POA: Diagnosis not present

## 2011-11-14 DIAGNOSIS — Z7982 Long term (current) use of aspirin: Secondary | ICD-10-CM | POA: Diagnosis not present

## 2011-11-14 DIAGNOSIS — Z951 Presence of aortocoronary bypass graft: Secondary | ICD-10-CM | POA: Diagnosis not present

## 2011-11-14 DIAGNOSIS — I10 Essential (primary) hypertension: Secondary | ICD-10-CM | POA: Diagnosis not present

## 2011-11-14 DIAGNOSIS — R0989 Other specified symptoms and signs involving the circulatory and respiratory systems: Secondary | ICD-10-CM | POA: Diagnosis not present

## 2011-11-14 DIAGNOSIS — M1610 Unilateral primary osteoarthritis, unspecified hip: Secondary | ICD-10-CM | POA: Diagnosis not present

## 2011-11-14 DIAGNOSIS — Z87891 Personal history of nicotine dependence: Secondary | ICD-10-CM | POA: Diagnosis not present

## 2011-11-14 DIAGNOSIS — F329 Major depressive disorder, single episode, unspecified: Secondary | ICD-10-CM | POA: Diagnosis not present

## 2011-11-14 DIAGNOSIS — J984 Other disorders of lung: Secondary | ICD-10-CM | POA: Diagnosis not present

## 2011-11-14 DIAGNOSIS — E119 Type 2 diabetes mellitus without complications: Secondary | ICD-10-CM | POA: Diagnosis not present

## 2011-11-14 DIAGNOSIS — I2510 Atherosclerotic heart disease of native coronary artery without angina pectoris: Secondary | ICD-10-CM | POA: Diagnosis not present

## 2011-11-14 DIAGNOSIS — J189 Pneumonia, unspecified organism: Secondary | ICD-10-CM | POA: Diagnosis not present

## 2011-11-14 DIAGNOSIS — R0609 Other forms of dyspnea: Secondary | ICD-10-CM | POA: Diagnosis not present

## 2011-11-14 DIAGNOSIS — Z79899 Other long term (current) drug therapy: Secondary | ICD-10-CM | POA: Diagnosis not present

## 2011-11-14 DIAGNOSIS — F411 Generalized anxiety disorder: Secondary | ICD-10-CM | POA: Diagnosis not present

## 2011-11-14 DIAGNOSIS — G609 Hereditary and idiopathic neuropathy, unspecified: Secondary | ICD-10-CM | POA: Diagnosis not present

## 2011-11-14 DIAGNOSIS — K219 Gastro-esophageal reflux disease without esophagitis: Secondary | ICD-10-CM | POA: Diagnosis not present

## 2011-11-14 DIAGNOSIS — Z96649 Presence of unspecified artificial hip joint: Secondary | ICD-10-CM | POA: Diagnosis not present

## 2011-11-14 DIAGNOSIS — E7800 Pure hypercholesterolemia, unspecified: Secondary | ICD-10-CM | POA: Diagnosis not present

## 2011-11-14 DIAGNOSIS — Z883 Allergy status to other anti-infective agents status: Secondary | ICD-10-CM | POA: Diagnosis not present

## 2011-11-14 DIAGNOSIS — Z880 Allergy status to penicillin: Secondary | ICD-10-CM | POA: Diagnosis not present

## 2011-11-14 DIAGNOSIS — I509 Heart failure, unspecified: Secondary | ICD-10-CM | POA: Diagnosis not present

## 2011-11-14 DIAGNOSIS — I517 Cardiomegaly: Secondary | ICD-10-CM | POA: Diagnosis not present

## 2011-11-14 DIAGNOSIS — M1990 Unspecified osteoarthritis, unspecified site: Secondary | ICD-10-CM | POA: Diagnosis not present

## 2011-11-14 DIAGNOSIS — R0602 Shortness of breath: Secondary | ICD-10-CM | POA: Diagnosis not present

## 2011-11-14 DIAGNOSIS — D61818 Other pancytopenia: Secondary | ICD-10-CM | POA: Diagnosis not present

## 2011-11-15 DIAGNOSIS — D61818 Other pancytopenia: Secondary | ICD-10-CM | POA: Diagnosis not present

## 2011-11-15 DIAGNOSIS — R0609 Other forms of dyspnea: Secondary | ICD-10-CM | POA: Diagnosis not present

## 2011-11-15 DIAGNOSIS — I509 Heart failure, unspecified: Secondary | ICD-10-CM | POA: Diagnosis not present

## 2011-11-15 DIAGNOSIS — J189 Pneumonia, unspecified organism: Secondary | ICD-10-CM | POA: Diagnosis not present

## 2011-11-15 DIAGNOSIS — R0989 Other specified symptoms and signs involving the circulatory and respiratory systems: Secondary | ICD-10-CM

## 2011-11-16 ENCOUNTER — Encounter: Payer: Self-pay | Admitting: Cardiology

## 2011-11-16 ENCOUNTER — Ambulatory Visit (INDEPENDENT_AMBULATORY_CARE_PROVIDER_SITE_OTHER): Payer: Medicare Other | Admitting: Cardiology

## 2011-11-16 VITALS — BP 148/74 | HR 72 | Ht 62.0 in | Wt 226.8 lb

## 2011-11-16 DIAGNOSIS — I2510 Atherosclerotic heart disease of native coronary artery without angina pectoris: Secondary | ICD-10-CM

## 2011-11-16 DIAGNOSIS — IMO0002 Reserved for concepts with insufficient information to code with codable children: Secondary | ICD-10-CM

## 2011-11-16 DIAGNOSIS — I5042 Chronic combined systolic (congestive) and diastolic (congestive) heart failure: Secondary | ICD-10-CM | POA: Diagnosis not present

## 2011-11-16 NOTE — Assessment & Plan Note (Addendum)
This may be associated with her presentation with anemia. We will try to get her records today. It is possible she has pancytopenia, and this could be related to the addition of Tegretol. She is taking this primarily for neuropathic pain, and I have instructed her to stop it. We will get her records today. She is also stopping her clopidogrel which is tolerated for quite some time.  We will increase her furosemide to one half tablets daily. And she will herself every day I will see her back in the office in 2 weeks.

## 2011-11-16 NOTE — Assessment & Plan Note (Signed)
Reviewed all of her cardiac medications. She needs to remain on them for adequate blood pressure control.

## 2011-11-16 NOTE — Progress Notes (Addendum)
HPI:  Returns in followup. She was just discharged from the hospital. They thought she might have pneumonia, and/or fluid overload. She was given one dose of Levaquin, and apparently diuresed. We do not yet have her records.  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   She was noted to have increasing blood dyscrasia. Of note, the patient was placed on Tegretol, and this was apparently done in the last few months by her neurologist for neuropathic pain. At the time of discharge from the hospital, they recommended that she weight herself daily. She denies any chest pain. She was noted in the hospital apparently to be anemic, and they have asked her to get back to see the hematologist for possible bone marrow.  In review of her records, Tegretol is new.  Patient also tells the last few days she has been bleeding from the rectum.  Current Outpatient Prescriptions  Medication Sig Dispense Refill  . ALPRAZolam (XANAX) 0.5 MG tablet Take 0.5 mg by mouth 3 (three) times daily as needed. For anxiety      . aspirin EC 81 MG tablet Take 81 mg by mouth daily.      [name] atorvastatin (LIPITOR) 20 MG tablet Take 20 mg by mouth daily.        . Calcium Carbonate-Vit D-Min 600-400 MG-UNIT TABS Take 1 tablet by mouth daily.       . carbamazepine (TEGRETOL) 200 MG tablet Take 200 mg by mouth 2 (two) times daily.      . clopidogrel (PLAVIX) 75 MG tablet Take 75 mg by mouth daily.        . Cyanocobalamin (VITAMIN B-12 PO) Take 1,000 mg by mouth daily.       [name] esomeprazole (NEXIUM) 40 MG capsule Take 40 mg by mouth daily before breakfast.        . ezetimibe (ZETIA) 10 MG tablet Take 10 mg by mouth daily.        . fish oil-omega-3 fatty acids 1000 MG capsule Take 1,200 g by mouth daily.       [name] FLUoxetine (PROZAC) 20 MG tablet Take 20 mg by mouth daily.        . furosemide (LASIX) 40 MG tablet Take 40 mg by mouth daily.        [name]  HYDROcodone-acetaminophen (NORCO) 10-325 MG per tablet Take 1 tablet by mouth every 6 (six) hours as needed. For pain.      . isosorbide mononitrate (IMDUR) 30 MG CR tablet Take 30 mg by mouth every morning.        . metoprolol succinate (TOPROL-XL) 25 MG 24 hr tablet Take 25 mg by mouth daily.        . valsartan (DIOVAN) 160 MG tablet Take 240 mg by mouth daily.         Allergies  Allergen Reactions  . Pregabalin     Retains fluid  . Cefaclor Itching and Rash  . Cephalexin Itching and Rash  . Penicillins Rash    Past Medical History  Diagnosis Date  . Rapid palpitations   . Coronary artery disease     Status post percutaneous coronary intervention and bare metal stenting  of the left circumflex and subsequent percutaneous transluminal coronary anioplasty and placement of a 3.0 NIR bare metal stent proximal to the previous stent.  . Hypertension   . Hyperlipidemia   . Obesity   .  Gastroesophageal reflux disease   . Depression   . Osteoarthritis   . Status post hysterectomy   . History of pneumonia   . H/O: hysterectomy     Past Surgical History  Procedure Date  . Coronary artery bypass graft May 31,2001    x 3 with a vein graft to the first diagonal, vein graft to the right coronary  artery, and a free left internal mammary  artery to the obtuse marginal   . Cardiac catheterization 2004    left internal mammary artery to the obtuse marginal  was found to be small and thread like.  The two grafts were patent.  The left circumflex had 90% in-stent restenosis and cutting balloon angioplasty was performed followed by placement of a 3.0 x 21mm Taxus drug -eluting stent.    . Cardiac catheterization 2006    There was in-stent restenosis in the left circumflex and this was treated with cutting balloon angioplasty   . Cardiac catheterization 2008    vein graft to the to the obtuse marginal was patent, although small, left circumflex had 40% in-stent restenosis, ejection fraction  40-45%.  The patient was medically mananged.  . Tonsillectomy   . Joint replacement   . Rotator cuff left 2007  . Back surgery   . Abdominal hysterectomy   . Colonoscopy 03/29/2011    Procedure: COLONOSCOPY;  Surgeon: Jamesetta So, MD;  Location: [facility];  Service: Gastroenterology;  Laterality: N/A;    Family History  Problem Relation Age of Onset  . Heart attack Mother   . Heart attack Father 53    cause of death  . Coronary artery disease Sister     CABG     History   Social History  . Marital Status: Divorced    Spouse Name: N/A    Number of Children: N/A  . Years of Education: N/A   Occupational History  . Disabled    Social History Main Topics  . Smoking status: Former Smoker -- 0.2 packs/day    Quit date: 07/21/1995  . Smokeless tobacco: Not on file  . Alcohol Use: No  . Drug Use: No  . Sexually Active:    Other Topics Concern  . Not on file   Social History Narrative   Lives in [place] by herself.      ROS: Please see the HPI.  All other systems reviewed and negative.  PHYSICAL EXAM:  BP 148/74  Pulse 72  Ht 5\' 2"  (1.575 m)  Wt 226 lb 12.8 oz (102.876 kg)  BMI 41.48 kg/m2  LMP 03/29/2011  General: Well developed, well nourished, in no acute distress. Head:  Normocephalic and atraumatic. Neck: no JVD Lungs: Clear to auscultation and percussion. Heart: Normal S1 and S2.  Soft apical murmur Abdomen:  Normal bowel sounds; soft; non tender; no organomegaly Pulses: Pulses normal in all 4 extremities. Extremities: No clubbing or cyanosis. No edema. Neurologic: Alert and oriented x 3.  EKG: NSR.  Nonspecific ST and T changes.   ASSESSMENT AND PLAN:

## 2011-11-16 NOTE — Patient Instructions (Addendum)
Your physician has recommended you make the following change in your medication: STOP Plavix, STOP Tegretol, START Ferrous Sulfate 325mg  one daily  Your physician recommends that you schedule a follow-up appointment in: 2 WEEKS with Dr Lia Foyer

## 2011-11-16 NOTE — Assessment & Plan Note (Signed)
The patient has had in-stent restenosis with subsequent placement of a first generation drug-eluting stent. As a result, we have maintained her on dual antiplatelet therapy. She's tolerated that well over a number of years, but with some positive stools we will go ahead and stop this.

## 2011-11-16 NOTE — Assessment & Plan Note (Signed)
The patients labs were reviewed.  She has developed pancytopenia.  She is also microcytic and describes some blood in her stool.  We will stop plavix and I have discussed the issues of VLST with her  (has a first gen DES used to treat in stent restenosis).  She understands there have been reports of increased VLST.  However, we will stop this at present.  I also suggested she stop Tegretol as this has been started recently, and she likely needs to have a bone marrow.  We will call her hematologist to discuss.

## 2011-11-21 ENCOUNTER — Telehealth: Payer: Self-pay | Admitting: Cardiology

## 2011-11-21 NOTE — Telephone Encounter (Signed)
I spoke with patient and reviewed my prior message in detail.  She knows to call today and reschedule her appointment earlier.

## 2011-11-21 NOTE — Telephone Encounter (Signed)
I called and spoke with Dr. Floyde Parkins who confirmed that she should come off of Tegretol.  He told me that should could stop without taper as she was not on it for seziure disorder, but for nerve pain.  I also placed a call to the office of Dr. Dina Rich [phone]) and he is out but gave him my cell to contact me regarding her case.  Dr. Jannifer Franklin implied that she could be followed perhaps with the dc of Tegretol for recovering counts.  Will discuss when I hear.  I then called the patient and left a message on her cell phone regarding the above.   I encouraged her to confirm her appointment with Dr. Dina Rich.  Will forward to our staff.

## 2011-11-29 ENCOUNTER — Encounter: Payer: Medicare Other | Admitting: Internal Medicine

## 2011-11-29 DIAGNOSIS — D473 Essential (hemorrhagic) thrombocythemia: Secondary | ICD-10-CM | POA: Diagnosis not present

## 2011-11-29 DIAGNOSIS — D696 Thrombocytopenia, unspecified: Secondary | ICD-10-CM

## 2011-11-29 DIAGNOSIS — R161 Splenomegaly, not elsewhere classified: Secondary | ICD-10-CM | POA: Diagnosis not present

## 2011-11-29 DIAGNOSIS — K7689 Other specified diseases of liver: Secondary | ICD-10-CM | POA: Diagnosis not present

## 2011-11-30 ENCOUNTER — Ambulatory Visit (INDEPENDENT_AMBULATORY_CARE_PROVIDER_SITE_OTHER): Payer: Medicare Other | Admitting: Cardiology

## 2011-11-30 ENCOUNTER — Encounter: Payer: Self-pay | Admitting: Cardiology

## 2011-11-30 VITALS — BP 117/64 | HR 64 | Ht 62.0 in | Wt 224.0 lb

## 2011-11-30 DIAGNOSIS — I2510 Atherosclerotic heart disease of native coronary artery without angina pectoris: Secondary | ICD-10-CM

## 2011-11-30 DIAGNOSIS — IMO0002 Reserved for concepts with insufficient information to code with codable children: Secondary | ICD-10-CM

## 2011-11-30 DIAGNOSIS — I5042 Chronic combined systolic (congestive) and diastolic (congestive) heart failure: Secondary | ICD-10-CM | POA: Diagnosis not present

## 2011-11-30 NOTE — Patient Instructions (Addendum)
Your physician recommends that you schedule a follow-up appointment in: 3-4 WEEKS  Your physician recommends that you continue on your current medications as directed. Please refer to the Current Medication list given to you today.

## 2011-12-11 NOTE — Assessment & Plan Note (Signed)
She had stenting of the CFX with DES for ISR, and has been off plavix and currently stable.  With her reduced platetel count, we will keep her off of plavix and I have discussed these findings in detail with the patient.

## 2011-12-11 NOTE — Assessment & Plan Note (Signed)
Controlled at present.  Continue medical therapy.

## 2011-12-11 NOTE — Assessment & Plan Note (Signed)
She seems euvolemic at the present time and stable.  We will not make changes at present, and I will see her in four weeks.

## 2011-12-11 NOTE — Progress Notes (Signed)
HPI:  The patient returns today in followup. Since I last saw her, she is been seen back in followup by the hematologist. Her hemoglobin and white count are back up. Her platelet count remains suppressed. I believe they're planning to do a bone marrow.  It seems like her Tegretol was the culprit with regard to her hematologic disorder primarily. However, she has had prolonged thrombocytopenia, and further evaluation has been suggested. She and I also had an extensive conversation about whether to continue or whether come off of clopidogrel at the present time. She's been off, and not had any 110 the consequences. I discussed with her the issue of long-term dual antiplatelet therapy, its risks and benefits and uncertainties.  I told him I will continue to follow her closely. She's not had any chest pain since the episode. Her shortness of breath has improved.    Current Outpatient Prescriptions  Medication Sig Dispense Refill  . ALPRAZolam (XANAX) 0.5 MG tablet Take 0.5 mg by mouth 3 (three) times daily as needed. For anxiety      . aspirin EC 81 MG tablet Take 81 mg by mouth daily.      [name] atorvastatin (LIPITOR) 20 MG tablet Take 20 mg by mouth daily.        . Calcium Carbonate-Vit D-Min 600-400 MG-UNIT TABS Take 1 tablet by mouth daily.       [name] esomeprazole (NEXIUM) 40 MG capsule Take 40 mg by mouth daily before breakfast.        . ezetimibe (ZETIA) 10 MG tablet Take 10 mg by mouth daily.        . fish oil-omega-3 fatty acids 1000 MG capsule Take 1,200 g by mouth daily.       [name] FLUoxetine (PROZAC) 20 MG tablet Take 20 mg by mouth daily.        . furosemide (LASIX) 40 MG tablet 40 mg. Take 1 1/2 pill daily      . HYDROcodone-acetaminophen (NORCO) 10-325 MG per tablet Take 1 tablet by mouth every 6 (six) hours as needed. For pain.      . isosorbide mononitrate (IMDUR) 30 MG CR tablet Take 30 mg by mouth every morning.        . metoprolol succinate (TOPROL-XL) 25 MG 24 hr tablet Take 25 mg by  mouth daily.        . valsartan (DIOVAN) 160 MG tablet Take 240 mg by mouth daily.         Allergies  Allergen Reactions  . Pregabalin     Retains fluid  . Cefaclor Itching and Rash  . Cephalexin Itching and Rash  . Penicillins Rash    Past Medical History  Diagnosis Date  . Rapid palpitations   . Coronary artery disease     Status post percutaneous coronary intervention and bare metal stenting  of the left circumflex and subsequent percutaneous transluminal coronary anioplasty and placement of a 3.0 NIR bare metal stent proximal to the previous stent.  . Hypertension   . Hyperlipidemia   . Obesity   . Gastroesophageal reflux disease   . Depression   . Osteoarthritis   . Status post hysterectomy   . History of pneumonia   . H/O: hysterectomy     Past Surgical History  Procedure Date  . Coronary artery bypass graft May 31,2001    x 3 with a vein graft to the first diagonal, vein graft to the right coronary  artery, and a free left internal mammary  artery to the obtuse marginal   . Cardiac catheterization 2004    left internal mammary artery to the obtuse marginal  was found to be small and thread like.  The two grafts were patent.  The left circumflex had 90% in-stent restenosis and cutting balloon angioplasty was performed followed by placement of a 3.0 x 37mm Taxus drug -eluting stent.    . Cardiac catheterization 2006    There was in-stent restenosis in the left circumflex and this was treated with cutting balloon angioplasty   . Cardiac catheterization 2008    vein graft to the to the obtuse marginal was patent, although small, left circumflex had 40% in-stent restenosis, ejection fraction 40-45%.  The patient was medically mananged.  . Tonsillectomy   . Joint replacement   . Rotator cuff left 2007  . Back surgery   . Abdominal hysterectomy   . Colonoscopy 03/29/2011    Procedure: COLONOSCOPY;  Surgeon: Jamesetta So, MD;  Location: [facility];  Service:  Gastroenterology;  Laterality: N/A;    Family History  Problem Relation Age of Onset  . Heart attack Mother   . Heart attack Father 95    cause of death  . Coronary artery disease Sister     CABG     History   Social History  . Marital Status: Divorced    Spouse Name: N/A    Number of Children: N/A  . Years of Education: N/A   Occupational History  . Disabled    Social History Main Topics  . Smoking status: Former Smoker -- 0.2 packs/day    Quit date: 07/21/1995  . Smokeless tobacco: Not on file  . Alcohol Use: No  . Drug Use: No  . Sexually Active:    Other Topics Concern  . Not on file   Social History Narrative   Lives in [place] by herself.      ROS: Please see the HPI.  All other systems reviewed and negative.  PHYSICAL EXAM:  BP 117/64  Pulse 64  Ht 5\' 2"  (1.575 m)  Wt 224 lb (101.606 kg)  BMI 40.97 kg/m2  LMP 03/29/2011  General: Well developed, well nourished, in no acute distress. Head:  Normocephalic and atraumatic. Neck: no JVD Lungs: Clear to auscultation and percussion. Heart: Normal S1 and S2.  Minimal apical murmur.   Abdomen:  Normal bowel sounds; soft; non tender; no organomegaly Pulses: Pulses normal in all 4 extremities. Extremities: No clubbing or cyanosis. No edema.  No bruising.   Neurologic: Alert and oriented x 3.  EKG:    ASSESSMENT AND PLAN:

## 2011-12-13 ENCOUNTER — Encounter (HOSPITAL_COMMUNITY): Payer: Self-pay

## 2011-12-13 DIAGNOSIS — K921 Melena: Secondary | ICD-10-CM | POA: Diagnosis not present

## 2011-12-13 NOTE — H&P (Signed)
Samantha [name] is an 61 y.o. female.   Chief Complaint: **blood in stool* HPI: *61yo wf recently admitted to [facility] with GI bleeding and low platelets.  Had a TCS earlier this year which was negative.  Now referred for EGD.**  Past Medical History  Diagnosis Date  . Rapid palpitations   . Coronary artery disease     Status post percutaneous coronary intervention and bare metal stenting  of the left circumflex and subsequent percutaneous transluminal coronary anioplasty and placement of a 3.0 NIR bare metal stent proximal to the previous stent.  . Hypertension   . Hyperlipidemia   . Obesity   . Gastroesophageal reflux disease   . Depression   . Osteoarthritis   . Status post hysterectomy   . History of pneumonia   . H/O: hysterectomy     Past Surgical History  Procedure Date  . Coronary artery bypass graft May 31,2001    x 3 with a vein graft to the first diagonal, vein graft to the right coronary  artery, and a free left internal mammary  artery to the obtuse marginal   . Cardiac catheterization 2004    left internal mammary artery to the obtuse marginal  was found to be small and thread like.  The two grafts were patent.  The left circumflex had 90% in-stent restenosis and cutting balloon angioplasty was performed followed by placement of a 3.0 x 41m Taxus drug -eluting stent.    . Cardiac catheterization 2006    There was in-stent restenosis in the left circumflex and this was treated with cutting balloon angioplasty   . Cardiac catheterization 2008    vein graft to the to the obtuse marginal was patent, although small, left circumflex had 40% in-stent restenosis, ejection fraction 40-45%.  The patient was medically mananged.  . Tonsillectomy   . Joint replacement   . Rotator cuff left 2007  . Back surgery   . Abdominal hysterectomy   . Colonoscopy 03/29/2011    Procedure: COLONOSCOPY;  Surgeon: MJamesetta So MD;  [facility];  Service:  Gastroenterology;  Laterality: N/A;    Family History  Problem Relation Age of Onset  . Heart attack Mother   . Heart attack Father 742   cause of death  . Coronary artery disease Sister     CABG    Social History:  reports that she quit smoking about 16 years ago. She does not have any smokeless tobacco history on file. She reports that she does not drink alcohol or use illicit drugs.  Allergies:  Allergies  Allergen Reactions  . Pregabalin     Retains fluid  . Cefaclor Itching and Rash  . Cephalexin Itching and Rash  . Penicillins Rash    No prescriptions prior to admission    No results found for this or any previous visit (from the past 48 hour(s)). No results found.  Review of Systems  Constitutional: Positive for malaise/fatigue.  HENT: Negative.   Eyes: Negative.   Respiratory: Negative.   Cardiovascular: Negative.   Gastrointestinal: Positive for abdominal pain and blood in stool.  Genitourinary: Negative.   Skin: Negative.   Endo/Heme/Allergies: Bruises/bleeds easily.    Last menstrual period 03/29/2011. Physical Exam  Constitutional: She is oriented to person, place, and time. She appears well-developed and well-nourished.  HENT:  Head: Normocephalic and atraumatic.  Neck: Normal range of motion. Neck supple.  Cardiovascular: Normal rate, regular rhythm and normal heart sounds.  Respiratory: Effort normal and breath sounds normal.  GI: Soft. Bowel sounds are normal. She exhibits no distension. There is no tenderness.  Neurological: She is alert and oriented to person, place, and time.  Skin: Skin is warm and dry.     Assessment/Plan *Imp:  Blood in stool Plan:  Scheduled for EGD on 12/20/11.  Risks and benefits of procedure were fully explained to the patient, who gives informed consent.**  Samantha [name] A 12/13/2011, 3:27 PM

## 2011-12-20 ENCOUNTER — Encounter (HOSPITAL_COMMUNITY)
Admission: RE | Disposition: A | Discharge status: Home / Self Care | Payer: Self-pay | Source: Ambulatory Visit | Attending: General Surgery

## 2011-12-20 ENCOUNTER — Ambulatory Visit (HOSPITAL_COMMUNITY)
Admission: RE | Admit: 2011-12-20 | Discharge: 2011-12-20 | Disposition: A | Discharge status: Home / Self Care | Payer: Medicare Other | Source: Ambulatory Visit | Attending: General Surgery | Admitting: General Surgery

## 2011-12-20 ENCOUNTER — Encounter (HOSPITAL_COMMUNITY): Payer: Self-pay | Admitting: *Deleted

## 2011-12-20 DIAGNOSIS — I10 Essential (primary) hypertension: Secondary | ICD-10-CM | POA: Insufficient documentation

## 2011-12-20 DIAGNOSIS — E785 Hyperlipidemia, unspecified: Secondary | ICD-10-CM | POA: Diagnosis not present

## 2011-12-20 DIAGNOSIS — I8500 Esophageal varices without bleeding: Secondary | ICD-10-CM | POA: Diagnosis not present

## 2011-12-20 DIAGNOSIS — K921 Melena: Secondary | ICD-10-CM | POA: Insufficient documentation

## 2011-12-20 HISTORY — PX: ESOPHAGOGASTRODUODENOSCOPY: SHX5428

## 2011-12-20 SURGERY — EGD (ESOPHAGOGASTRODUODENOSCOPY)
Anesthesia: Moderate Sedation

## 2011-12-20 MED ORDER — MEPERIDINE HCL 50 MG/ML IJ SOLN
INTRAMUSCULAR | Status: AC
  Filled 2011-12-20: qty 1

## 2011-12-20 MED ORDER — BUTAMBEN-TETRACAINE-BENZOCAINE 2-2-14 % EX AERO
INHALATION_SPRAY | CUTANEOUS | Status: DC | PRN
  Administered 2011-12-20: 2 via TOPICAL

## 2011-12-20 MED ORDER — MEPERIDINE HCL 25 MG/ML IJ SOLN
INTRAMUSCULAR | Status: DC | PRN
  Administered 2011-12-20: 50 mg via INTRAVENOUS

## 2011-12-20 MED ORDER — MIDAZOLAM HCL 5 MG/5ML IJ SOLN
INTRAMUSCULAR | Status: AC
  Filled 2011-12-20: qty 5

## 2011-12-20 MED ORDER — MIDAZOLAM HCL 5 MG/5ML IJ SOLN
INTRAMUSCULAR | Status: DC | PRN
  Administered 2011-12-20: 2 mg via INTRAVENOUS

## 2011-12-20 MED ORDER — SODIUM CHLORIDE 0.45 % IV SOLN
INTRAVENOUS | Status: DC
  Administered 2011-12-20: 09:00:00 via INTRAVENOUS

## 2011-12-20 NOTE — Op Note (Addendum)
[place], [address]   ENDOSCOPY PROCEDURE REPORT  PATIENT: Samantha, [name].  MR#: [id number] BIRTHDATE: 12/12/1950 , 61  yrs. old GENDER: Female ENDOSCOPIST: Aviva Signs, MD REFERRED BY:  [name] PROCEDURE DATE:  12/20/2011 PROCEDURE:  EGD w/ biopsy for H.pylori ASA CLASS:     Class II INDICATIONS:  hematochezia. MEDICATIONS: Demerol 50 mg IV and Versed 2 mg IV TOPICAL ANESTHETIC: Cetacaine Spray  DESCRIPTION OF PROCEDURE: After the risks benefits and alternatives of the procedure were thoroughly explained, informed consent was obtained.  The Pentax Gastroscope [id number] endoscope was introduced through the mouth and advanced to the second portion of the duodenum. Without limitations.  The instrument was slowly withdrawn as the mucosa was fully examined.      JEJUNUM: The exam showed no abnormalities in the jejunum.  DUODENUM: The duodenal mucosa showed no abnormalities.  STOMACH: Abnormal mucosa was found in the gastric antrum.  ESOPHAGUS: There were 2 columns of varices in the distal third of the esophagus.  The varices were actively bleeding.  Retroflexed views revealed no abnormalities.     The scope was then withdrawn from the patient and the procedure completed.  COMPLICATIONS: There were no complications. ENDOSCOPIC IMPRESSION: 1.   The exam showed no abnormalities 2.   The duodenal mucosa showed no abnormalities 3.   Abnormal mucosa was found in the gastric antrum 4.   There were 2 columns of varices in the distal third of the esophagus; The varices were  RECOMMENDATIONS: Suggest referral to GI.  REPEAT EXAM:  eSigned:  Aviva Signs, MD 2011-12-20 32:[id number]   CC:      Non bleeding varices noted.

## 2011-12-20 NOTE — Interval H&P Note (Signed)
History and Physical Interval Note:  12/20/2011 9:14 AM  Samantha [name]  has presented today for surgery, with the diagnosis of GERD gastroesophageal reflux disease   The various methods of treatment have been discussed with the patient and family. After consideration of risks, benefits and other options for treatment, the patient has consented to  Procedure(s) (LRB) with comments: ESOPHAGOGASTRODUODENOSCOPY (EGD) (N/A) as a surgical intervention .  The patient's history has been reviewed, patient examined, no change in status, stable for surgery.  I have reviewed the patient's chart and labs.  Questions were answered to the patient's satisfaction.     Aviva Signs A

## 2011-12-21 LAB — CLOTEST (H. PYLORI), BIOPSY: Helicobacter screen: NEGATIVE

## 2011-12-23 ENCOUNTER — Telehealth (INDEPENDENT_AMBULATORY_CARE_PROVIDER_SITE_OTHER): Payer: Self-pay | Admitting: *Deleted

## 2011-12-23 ENCOUNTER — Ambulatory Visit (INDEPENDENT_AMBULATORY_CARE_PROVIDER_SITE_OTHER): Payer: Medicare Other | Admitting: Internal Medicine

## 2011-12-23 ENCOUNTER — Encounter (INDEPENDENT_AMBULATORY_CARE_PROVIDER_SITE_OTHER): Payer: Self-pay | Admitting: Internal Medicine

## 2011-12-23 ENCOUNTER — Encounter (INDEPENDENT_AMBULATORY_CARE_PROVIDER_SITE_OTHER): Payer: Self-pay | Admitting: *Deleted

## 2011-12-23 ENCOUNTER — Other Ambulatory Visit (INDEPENDENT_AMBULATORY_CARE_PROVIDER_SITE_OTHER): Payer: Self-pay | Admitting: *Deleted

## 2011-12-23 VITALS — BP 120/54 | HR 68 | Temp 98.3°F | Ht 62.0 in | Wt 219.4 lb

## 2011-12-23 DIAGNOSIS — I8500 Esophageal varices without bleeding: Secondary | ICD-10-CM | POA: Diagnosis not present

## 2011-12-23 DIAGNOSIS — D696 Thrombocytopenia, unspecified: Secondary | ICD-10-CM

## 2011-12-23 DIAGNOSIS — R918 Other nonspecific abnormal finding of lung field: Secondary | ICD-10-CM

## 2011-12-23 DIAGNOSIS — I509 Heart failure, unspecified: Secondary | ICD-10-CM | POA: Diagnosis not present

## 2011-12-23 DIAGNOSIS — K6289 Other specified diseases of anus and rectum: Secondary | ICD-10-CM

## 2011-12-23 DIAGNOSIS — R7401 Elevation of levels of liver transaminase levels: Secondary | ICD-10-CM | POA: Diagnosis not present

## 2011-12-23 DIAGNOSIS — K922 Gastrointestinal hemorrhage, unspecified: Secondary | ICD-10-CM | POA: Diagnosis not present

## 2011-12-23 DIAGNOSIS — D649 Anemia, unspecified: Secondary | ICD-10-CM

## 2011-12-23 DIAGNOSIS — E319 Polyglandular dysfunction, unspecified: Secondary | ICD-10-CM | POA: Diagnosis not present

## 2011-12-23 LAB — PROTIME-INR
INR: 1.08 (ref <1.50)
Prothrombin Time: 14 seconds (ref 11.6–15.2)

## 2011-12-23 MED ORDER — HYDROCORTISONE ACE-PRAMOXINE 1-1 % RE FOAM: 1.0000 | Freq: Two times a day (BID) | RECTAL | Status: DC

## 2011-12-23 NOTE — Telephone Encounter (Signed)
Labs per Lelon Perla

## 2011-12-23 NOTE — Progress Notes (Signed)
Subjective:     Patient ID: Samantha [name], female   DOB: 1950/08/31, 61 y.o.   MRN: [id number]  HPI  Referred to our office by Dr. Tresa Garter for esophageal varices. She underwent an EGD by Dr. Arnoldo Morale 3 days ago. (Please see below). She tells me she continues to have rectal bleeding.  She tells me she has a lot of pain with her BMs. She says it feels like it is ripping her apart. Symptoms x 2-3 yrs. She is having bright red rectal bleeding.  Her BMs are light brown.  Appetite is good. No unintentional weight loss.  Recently admitted [place] because of SOB and discharge with new diagnosis of CHF.  While in the hospital, nurse noted bleeding from her rectum. She was then referred to Dr. Arnoldo Morale for an EGD. She had just had a colonoscopy in January of this year.  Recently taken off Plavix. Three cardiac stents. No tattoos.  NO IV drug use. She has not been with a sexual partner in over 59yrs. No etoh. Aunt with non alcoholic liver disease EGD 12/20/2011:COMPLICATIONS: There were no complications.  ENDOSCOPIC IMPRESSION:  1. The exam showed no abnormalities  2. The duodenal mucosa showed no abnormalities  3. Abnormal mucosa was found in the gastric antrum  4. There were 2 columns of varices in the distal third of the  esophagus; The varices were not actively bleeding.   03/2011 Colonoscopy Dr. Jenkins:FINDINGS: Scattered diverticula were found in the sigmoid colon.  Retroflexed views in the rectum revealed no abnormalities. The  scope was then withdrawn in 6 minutes from the cecum and the  procedure completed.  COMPLICATIONS: None  ENDOSCOPIC IMPRESSION:  1) Diverticula, scattered in the sigmoid colon  2) Normal colonoscopy otherwise  RECOMMENDATIONS:  09/30/2011  H nad H 10.3 and 34.1, MCV 78.1, Platelet 110. 10/01/2-13 ALP 102, AST 47, ALT 25. Iron 323, Transferrin 353, TIBC 494  %Satu 65   Review of Systems     Current Outpatient Prescriptions  Medication Sig Dispense Refill  .  ALPRAZolam (XANAX) 0.5 MG tablet Take 0.5 mg by mouth 3 (three) times daily as needed. For anxiety. Rarely takes.      [name] aspirin EC 81 MG tablet Take 81 mg by mouth daily.      [name] atorvastatin (LIPITOR) 20 MG tablet Take 20 mg by mouth daily.        [name] esomeprazole (NEXIUM) 40 MG capsule Take 40 mg by mouth daily before breakfast.       . ezetimibe (ZETIA) 10 MG tablet Take 10 mg by mouth daily.        . fish oil-omega-3 fatty acids 1000 MG capsule Take 1,200 g by mouth daily.       [name] FLUoxetine (PROZAC) 20 MG tablet Take 20 mg by mouth daily.       . furosemide (LASIX) 40 MG tablet Take 40 mg by mouth daily. 60mg  daily      . HYDROcodone-acetaminophen (NORCO) 10-325 MG per tablet Take 1 tablet by mouth every 6 (six) hours as needed. For pain.      . isosorbide mononitrate (IMDUR) 30 MG CR tablet Take 30 mg by mouth daily.       . metoprolol succinate (TOPROL-XL) 25 MG 24 hr tablet Take 25 mg by mouth daily.        . valsartan (DIOVAN) 160 MG tablet Take 80-160 mg by mouth 2 (two) times daily. Take 160mg  (1 tablet) every morning and 80 mg (1/2 tablet)  at bedtime      . hydrocortisone-pramoxine (PROCTOFOAM-HC) rectal foam Place 1 applicator rectally every 12 (twelve) hours.  10 g  1   Past Medical History  Diagnosis Date  . Rapid palpitations   . Coronary artery disease     Status post percutaneous coronary intervention and bare metal stenting  of the left circumflex and subsequent percutaneous transluminal coronary anioplasty and placement of a 3.0 NIR bare metal stent proximal to the previous stent.  . Hypertension   . Hyperlipidemia   . Obesity   . Gastroesophageal reflux disease   . Depression   . Osteoarthritis   . Status post hysterectomy   . History of pneumonia   . H/O: hysterectomy   . CHF (congestive heart failure)   . Esophageal varices     New 2013   Past Surgical History  Procedure Date  . Coronary artery bypass graft May 31,2001    x 3 with a vein graft to the first  diagonal, vein graft to the right coronary  artery, and a free left internal mammary  artery to the obtuse marginal   . Cardiac catheterization 2004    left internal mammary artery to the obtuse marginal  was found to be small and thread like.  The two grafts were patent.  The left circumflex had 90% in-stent restenosis and cutting balloon angioplasty was performed followed by placement of a 3.0 x 44mm Taxus drug -eluting stent.    . Cardiac catheterization 2006    There was in-stent restenosis in the left circumflex and this was treated with cutting balloon angioplasty   . Cardiac catheterization 2008    vein graft to the to the obtuse marginal was patent, although small, left circumflex had 40% in-stent restenosis, ejection fraction 40-45%.  The patient was medically mananged.  . Tonsillectomy   . Joint replacement   . Rotator cuff left 2007  . Back surgery   . Abdominal hysterectomy   . Colonoscopy 03/29/2011    Procedure: COLONOSCOPY;  Surgeon: Jamesetta So, MD;  Location: [facility];  Service: Gastroenterology;  Laterality: N/A;   History   Social History  . Marital Status: Divorced    Spouse Name: N/A    Number of Children: N/A  . Years of Education: N/A   Occupational History  . Disabled    Social History Main Topics  . Smoking status: Former Smoker -- 0.5 packs/day for 20 years    Types: Cigarettes    Quit date: 07/21/1995  . Smokeless tobacco: Not on file  . Alcohol Use: No  . Drug Use: No  . Sexually Active:    Other Topics Concern  . Not on file   Social History Narrative   Lives in [place] by herself.     Family Status  Relation Status Death Age  . Mother Deceased 13    died in her sleep  . Father Deceased 33  . Sister Alive   . Brother Alive    Allergies  Allergen Reactions  . Pregabalin     Retains fluid  . Cefaclor Itching and Rash  . Cephalexin Itching and Rash  . Penicillins Rash    Objective:   Physical Exam    Alert and oriented. Skin  warm and dry. Oral mucosa is moist.   . Sclera anicteric, conjunctivae is pink. Thyroid not enlarged. No cervical lymphadenopathy. Lungs clear. Heart regular rate and rhythm.  Abdomen is soft. Bowel sounds are positive. No hepatomegaly. No abdominal masses  felt. No tenderness.  No edema to lower extremities. Stool brown and guaiac negative. Assessment:    Thrombocytopenia possible related to fatty liver. Rectal bleeding: possible hemorrhoidal. 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    Plan:    US abdomen, Iron studies, AFP, Cerplasmin, SMA, ANA, PT/INR, AFP, CBC, CMET, alpha 1 antitrypsin  Proctofoam e prescribed to her pharmacy.

## 2011-12-23 NOTE — Patient Instructions (Addendum)
Labs. Rx for proctofoam.

## 2011-12-24 LAB — IRON AND TIBC
%SAT: 12 % — ABNORMAL LOW (ref 20–55)
Iron: 57 ug/dL (ref 42–145)
TIBC: 481 ug/dL — ABNORMAL HIGH (ref 250–470)
UIBC: 424 ug/dL — ABNORMAL HIGH (ref 125–400)

## 2011-12-24 LAB — CBC
HCT: 41.1 % (ref 36.0–46.0)
Hemoglobin: 12.7 g/dL (ref 12.0–15.0)
MCH: 24.3 pg — ABNORMAL LOW (ref 26.0–34.0)
MCHC: 30.9 g/dL (ref 30.0–36.0)
MCV: 78.6 fL (ref 78.0–100.0)
Platelets: 123 10*3/uL — ABNORMAL LOW (ref 150–400)
RBC: 5.23 MIL/uL — ABNORMAL HIGH (ref 3.87–5.11)
RDW: 18.6 % — ABNORMAL HIGH (ref 11.5–15.5)
WBC: 4.3 10*3/uL (ref 4.0–10.5)

## 2011-12-24 LAB — SEDIMENTATION RATE: Sed Rate: 17 mm/hr (ref 0–22)

## 2011-12-24 LAB — HEPATITIS C ANTIBODY: HCV Ab: NEGATIVE

## 2011-12-24 LAB — HEPATITIS B SURFACE ANTIBODY,QUALITATIVE: Hep B S Ab: NEGATIVE

## 2011-12-24 LAB — AFP TUMOR MARKER: AFP-Tumor Marker: 4.6 ng/mL (ref 0.0–8.0)

## 2011-12-24 LAB — FERRITIN: Ferritin: 16 ng/mL (ref 10–291)

## 2011-12-24 LAB — ALBUMIN: Albumin: 4.4 g/dL (ref 3.5–5.2)

## 2011-12-24 LAB — CERULOPLASMIN: Ceruloplasmin: 36 mg/dL (ref 20–60)

## 2011-12-24 LAB — ALPHA-1-ANTITRYPSIN: A-1 Antitrypsin, Ser: 117 mg/dL (ref 90–200)

## 2011-12-26 LAB — ANA: Anti Nuclear Antibody(ANA): NEGATIVE

## 2011-12-27 ENCOUNTER — Encounter (HOSPITAL_COMMUNITY): Payer: Self-pay | Admitting: General Surgery

## 2011-12-27 ENCOUNTER — Ambulatory Visit (HOSPITAL_COMMUNITY)
Admission: RE | Admit: 2011-12-27 | Discharge: 2011-12-27 | Disposition: A | Discharge status: Home / Self Care | Payer: Medicare Other | Source: Ambulatory Visit | Attending: Internal Medicine | Admitting: Internal Medicine

## 2011-12-27 DIAGNOSIS — K625 Hemorrhage of anus and rectum: Secondary | ICD-10-CM | POA: Insufficient documentation

## 2011-12-27 DIAGNOSIS — D696 Thrombocytopenia, unspecified: Secondary | ICD-10-CM | POA: Diagnosis not present

## 2011-12-27 DIAGNOSIS — R161 Splenomegaly, not elsewhere classified: Secondary | ICD-10-CM | POA: Insufficient documentation

## 2011-12-27 DIAGNOSIS — I8500 Esophageal varices without bleeding: Secondary | ICD-10-CM

## 2011-12-28 NOTE — Telephone Encounter (Signed)
Apt has been scheduled for 02/06/12 with Dr. Rehman. 

## 2011-12-30 ENCOUNTER — Ambulatory Visit: Admit: 2011-12-30 | Payer: Self-pay | Admitting: Internal Medicine

## 2011-12-30 SURGERY — SIGMOIDOSCOPY, FLEXIBLE
Anesthesia: Moderate Sedation

## 2012-01-02 ENCOUNTER — Ambulatory Visit (INDEPENDENT_AMBULATORY_CARE_PROVIDER_SITE_OTHER): Payer: Medicare Other | Admitting: Cardiology

## 2012-01-02 ENCOUNTER — Encounter: Payer: Self-pay | Admitting: Cardiology

## 2012-01-02 VITALS — BP 122/64 | HR 70 | Ht 62.0 in | Wt 221.0 lb

## 2012-01-02 DIAGNOSIS — I509 Heart failure, unspecified: Secondary | ICD-10-CM

## 2012-01-02 DIAGNOSIS — E782 Mixed hyperlipidemia: Secondary | ICD-10-CM | POA: Diagnosis not present

## 2012-01-02 DIAGNOSIS — Z951 Presence of aortocoronary bypass graft: Secondary | ICD-10-CM | POA: Diagnosis not present

## 2012-01-02 DIAGNOSIS — I5042 Chronic combined systolic (congestive) and diastolic (congestive) heart failure: Secondary | ICD-10-CM | POA: Diagnosis not present

## 2012-01-02 NOTE — Patient Instructions (Addendum)
Your physician recommends that you schedule a follow-up appointment in: 2 MONTHS  Your physician recommends that you continue on your current medications as directed. Please refer to the Current Medication list given to you today.

## 2012-01-13 ENCOUNTER — Encounter (INDEPENDENT_AMBULATORY_CARE_PROVIDER_SITE_OTHER): Payer: Self-pay

## 2012-01-16 DIAGNOSIS — I5023 Acute on chronic systolic (congestive) heart failure: Secondary | ICD-10-CM | POA: Insufficient documentation

## 2012-01-16 NOTE — Assessment & Plan Note (Signed)
She remains off of clopidogrel appropriately.  She is on low dose ASA which is appropriate with DES for ISR.  Continue to monitor her status.

## 2012-01-16 NOTE — Assessment & Plan Note (Signed)
She remains stable.  Has mild LV systolic dysfunction with some MR, and also likely diastolic changes.  Continue current regimen.  Generally she is stable but needs to be monitored closely.

## 2012-01-16 NOTE — Progress Notes (Signed)
HPI:  The patient has multiple non cardiac related issues at present.  ? Cirrhosis. Weighing herself.  Tegretol helped her legs a lot, but she is off due to pancytopenia.  She had a colonoscopy in February, but she apparently is still bleeding from time to time.  She has taken ferrous sulfate, but her iron saturation is really quite high.  She has mildly microcytic indices. Has seen heme in [place].    Current Outpatient Prescriptions  Medication Sig Dispense Refill  . ALPRAZolam (XANAX) 0.5 MG tablet Take 0.5 mg by mouth 3 (three) times daily as needed. For anxiety. Rarely takes.      [name] aspirin EC 81 MG tablet Take 81 mg by mouth daily.      [name] atorvastatin (LIPITOR) 20 MG tablet Take 20 mg by mouth daily.        [name] esomeprazole (NEXIUM) 40 MG capsule Take 40 mg by mouth daily before breakfast.       . ezetimibe (ZETIA) 10 MG tablet Take 10 mg by mouth daily.        [name] FLUoxetine (PROZAC) 20 MG tablet Take 20 mg by mouth daily.       . furosemide (LASIX) 40 MG tablet Take  1 1/2 tab daily      . HYDROcodone-acetaminophen (NORCO) 10-325 MG per tablet Take 1 tablet by mouth every 6 (six) hours as needed. For pain.      . hydrocortisone-pramoxine (PROCTOFOAM-HC) rectal foam Place 1 applicator rectally every 12 (twelve) hours.  10 g  1  . isosorbide mononitrate (IMDUR) 30 MG CR tablet Take 30 mg by mouth daily.       . metoprolol succinate (TOPROL-XL) 25 MG 24 hr tablet Take 25 mg by mouth daily.        . valsartan (DIOVAN) 160 MG tablet Take one tab in the morning and 1/2 tab at bed time.        Allergies  Allergen Reactions  . Pregabalin     Retains fluid  . Cefaclor Itching and Rash  . Cephalexin Itching and Rash  . Penicillins Rash    Past Medical History  Diagnosis Date  . Rapid palpitations   . Coronary artery disease     Status post percutaneous coronary intervention and bare metal stenting  of the left circumflex and subsequent percutaneous transluminal coronary anioplasty and  placement of a 3.0 NIR bare metal stent proximal to the previous stent.  . Hypertension   . Hyperlipidemia   . Obesity   . Gastroesophageal reflux disease   . Depression   . Osteoarthritis   . Status post hysterectomy   . History of pneumonia   . H/O: hysterectomy   . CHF (congestive heart failure)   . Esophageal varices     New 2013    Past Surgical History  Procedure Date  . Coronary artery bypass graft May 31,2001    x 3 with a vein graft to the first diagonal, vein graft to the right coronary  artery, and a free left internal mammary  artery to the obtuse marginal   . Cardiac catheterization 2004    left internal mammary artery to the obtuse marginal  was found to be small and thread like.  The two grafts were patent.  The left circumflex had 90% in-stent restenosis and cutting balloon angioplasty was performed followed by placement of a 3.0 x 85mm Taxus drug -eluting stent.    . Cardiac catheterization 2006    There was  in-stent restenosis in the left circumflex and this was treated with cutting balloon angioplasty   . Cardiac catheterization 2008    vein graft to the to the obtuse marginal was patent, although small, left circumflex had 40% in-stent restenosis, ejection fraction 40-45%.  The patient was medically mananged.  . Tonsillectomy   . Joint replacement   . Rotator cuff left 2007  . Back surgery   . Abdominal hysterectomy   . Colonoscopy 03/29/2011    Procedure: COLONOSCOPY;  Surgeon: Jamesetta So, MD;  [facility]: [facility];  Service: Gastroenterology;  Laterality: N/A;  . Esophagogastroduodenoscopy 12/20/2011    Procedure: ESOPHAGOGASTRODUODENOSCOPY (EGD);  Surgeon: Jamesetta So, MD;  [facility]: [facility];  Service: Gastroenterology;  Laterality: N/A;    Family History  Problem Relation Age of Onset  . Heart attack Mother   . Heart attack Father 20    cause of death  . Coronary artery disease Sister     CABG   . Colon cancer Neg Hx     History    Social History  . Marital Status: Divorced    Spouse Name: N/A    Number of Children: N/A  . Years of Education: N/A   Occupational History  . Disabled    Social History Main Topics  . Smoking status: Former Smoker -- 0.5 packs/day for 20 years    Types: Cigarettes    Quit date: 07/21/1995  . Smokeless tobacco: Not on file  . Alcohol Use: No  . Drug Use: No  . Sexually Active:    Other Topics Concern  . Not on file   Social History Narrative   Lives in [place] by herself.      ROS: Please see the HPI.  All other systems reviewed and negative.  PHYSICAL EXAM:  BP 122/64  Pulse 70  Ht 5\' 2"  (1.575 m)  Wt 221 lb (100.245 kg)  BMI 40.42 kg/m2  LMP 03/29/2011  General: Well developed, well nourished, in no acute distress. Head:  Normocephalic and atraumatic. Neck: no JVD Lungs: Clear to auscultation and percussion. Heart: Normal S1 and S2.  No murmur, rubs or gallops.  Abdomen:  Normal bowel sounds; soft; non tender; no organomegaly Pulses: Pulses normal in all 4 extremities. Extremities: No clubbing or cyanosis. No edema. Neurologic: Alert and oriented x 3.  EKG:  ASSESSMENT AND PLAN:

## 2012-01-16 NOTE — Assessment & Plan Note (Signed)
Followed by Dr. Sasser 

## 2012-01-25 ENCOUNTER — Encounter: Payer: Self-pay | Admitting: Cardiology

## 2012-01-25 DIAGNOSIS — E7800 Pure hypercholesterolemia, unspecified: Secondary | ICD-10-CM | POA: Diagnosis not present

## 2012-01-25 DIAGNOSIS — E119 Type 2 diabetes mellitus without complications: Secondary | ICD-10-CM | POA: Diagnosis not present

## 2012-01-25 DIAGNOSIS — I10 Essential (primary) hypertension: Secondary | ICD-10-CM | POA: Diagnosis not present

## 2012-02-01 DIAGNOSIS — I10 Essential (primary) hypertension: Secondary | ICD-10-CM | POA: Diagnosis not present

## 2012-02-01 DIAGNOSIS — E7800 Pure hypercholesterolemia, unspecified: Secondary | ICD-10-CM | POA: Diagnosis not present

## 2012-02-01 DIAGNOSIS — I209 Angina pectoris, unspecified: Secondary | ICD-10-CM | POA: Diagnosis not present

## 2012-02-01 DIAGNOSIS — J209 Acute bronchitis, unspecified: Secondary | ICD-10-CM | POA: Diagnosis not present

## 2012-02-01 DIAGNOSIS — F411 Generalized anxiety disorder: Secondary | ICD-10-CM | POA: Diagnosis not present

## 2012-02-01 DIAGNOSIS — R042 Hemoptysis: Secondary | ICD-10-CM | POA: Diagnosis not present

## 2012-02-01 DIAGNOSIS — E119 Type 2 diabetes mellitus without complications: Secondary | ICD-10-CM | POA: Diagnosis not present

## 2012-02-04 DIAGNOSIS — R918 Other nonspecific abnormal finding of lung field: Secondary | ICD-10-CM | POA: Diagnosis not present

## 2012-02-04 DIAGNOSIS — Z951 Presence of aortocoronary bypass graft: Secondary | ICD-10-CM | POA: Diagnosis not present

## 2012-02-04 DIAGNOSIS — R062 Wheezing: Secondary | ICD-10-CM | POA: Diagnosis not present

## 2012-02-04 DIAGNOSIS — R059 Cough, unspecified: Secondary | ICD-10-CM | POA: Diagnosis not present

## 2012-02-04 DIAGNOSIS — Z7982 Long term (current) use of aspirin: Secondary | ICD-10-CM | POA: Diagnosis not present

## 2012-02-04 DIAGNOSIS — R05 Cough: Secondary | ICD-10-CM | POA: Diagnosis not present

## 2012-02-04 DIAGNOSIS — Z79899 Other long term (current) drug therapy: Secondary | ICD-10-CM | POA: Diagnosis not present

## 2012-02-04 DIAGNOSIS — I10 Essential (primary) hypertension: Secondary | ICD-10-CM | POA: Diagnosis not present

## 2012-02-04 DIAGNOSIS — R0989 Other specified symptoms and signs involving the circulatory and respiratory systems: Secondary | ICD-10-CM | POA: Diagnosis not present

## 2012-02-04 DIAGNOSIS — R509 Fever, unspecified: Secondary | ICD-10-CM | POA: Diagnosis not present

## 2012-02-04 DIAGNOSIS — J189 Pneumonia, unspecified organism: Secondary | ICD-10-CM | POA: Diagnosis not present

## 2012-02-06 ENCOUNTER — Encounter (INDEPENDENT_AMBULATORY_CARE_PROVIDER_SITE_OTHER): Payer: Self-pay | Admitting: Internal Medicine

## 2012-02-06 ENCOUNTER — Ambulatory Visit (INDEPENDENT_AMBULATORY_CARE_PROVIDER_SITE_OTHER): Payer: Medicare Other | Admitting: Internal Medicine

## 2012-02-06 VITALS — BP 118/68 | HR 74 | Temp 97.8°F | Resp 20 | Ht 62.0 in | Wt 220.0 lb

## 2012-02-06 DIAGNOSIS — K7460 Unspecified cirrhosis of liver: Secondary | ICD-10-CM

## 2012-02-06 DIAGNOSIS — I8500 Esophageal varices without bleeding: Secondary | ICD-10-CM

## 2012-02-06 NOTE — Progress Notes (Addendum)
Presenting complaint;  Followup for cirrhosis.  Subjective:  Patient is 61 year old Caucasian female who was evaluated by Dr. Arnoldo Morale for GI bleed and underwent esophagogastroduodenoscopy on 12/20/2011 and noted to have 2 columns of esophageal varices. He therefore felt she had cirrhosis and requested evaluation. Patient was found to have fatty liver approximately 10 years by Dr. [name], her primary care physician. She also has had mildly elevated transaminases. She also has history of thrombocytopenia and was seen by Dr. Audree Camel. Following her last visit she had multiple studies which I reviewed under lab data. Patient reports that she experienced frank rectal bleeding while she was hospitalized at [facility] in September 2013 for CHF. She recalls passing large amount of bright red blood per rectum. Since she had colonoscopy back in January 2013 Dr. Arnoldo Morale felt her upper GI tract needed to be evaluated and she had EGD on 12/20/2011 as above. While she has not had frank bleeding she has noted scant amount of bright red blood intermittently with her bowel movements. Last episode was about 2 weeks ago. She has good appetite. She states this is the most that she has ever weighed she seemed to be very motivated and losing weight. She denies abdominal pain, distention, pruritus, nausea, vomiting or heartburn is well controlled with therapy. There is no history of icteric hepatitis or jaundice in the past. Is also no history of tattoos or IV drug use. Presently she is being treated for pneumonia or bronchitis with antibiotic. She was seen in emergency room 2 days ago. She has cough with minimal sputum but denies dyspnea at rest.  Current Medications: Current Outpatient Prescriptions  Medication Sig Dispense Refill  . ALPRAZolam (XANAX) 0.5 MG tablet Take 0.5 mg by mouth 3 (three) times daily as needed. For anxiety. Rarely takes.      [name] aspirin EC 81 MG tablet Take 81 mg by mouth daily.      [name]  atorvastatin (LIPITOR) 20 MG tablet Take 20 mg by mouth daily.        [name] doxycycline (VIBRAMYCIN) 100 MG capsule Take 100 mg by mouth 2 (two) times daily.      [name] esomeprazole (NEXIUM) 40 MG capsule Take 40 mg by mouth daily before breakfast.       . ezetimibe (ZETIA) 10 MG tablet Take 10 mg by mouth daily.        [name] FLUoxetine (PROZAC) 20 MG tablet Take 20 mg by mouth daily.       . furosemide (LASIX) 40 MG tablet Take  1 1/2 tab daily      . HYDROcodone-acetaminophen (NORCO) 10-325 MG per tablet Take 1 tablet by mouth every 6 (six) hours as needed. For pain.      . isosorbide mononitrate (IMDUR) 30 MG CR tablet Take 30 mg by mouth daily.       . metoprolol succinate (TOPROL-XL) 25 MG 24 hr tablet Take 25 mg by mouth daily.        . valsartan (DIOVAN) 160 MG tablet Take one tab in the morning and 1/2 tab at bed time.       Past medical history; Coronary artery disease, status post CABG in 2001. She is said 2 coronary stents; first one was placed in 2004 second one in 2006. Last catheter was in February 2013. Recent hospitalization at [facility] for CHF in September 2013. GERD for more than 20 years. No evidence of erosive esophagitis on recent EGD of October,2013. CLOtest was negative. Obesity. She has had  vague problems with 10 years her peak weight has been 220 lbs. Depression. Hypertension. Hyperlipidemia. History of fatty liver and elevated transaminases. Sigmoid  diverticulosis on colonoscopy of January 2013. BSO with hysterectomy at age 26 for endometriosis. Lumbar spine surgery in 1997/05/02. She has chronic low back pain.. Right hip replacement in June 2002 for severe OA. Left shoulder surgery for rotator cuff tear in 05/02/05. She also had foot surgery in July,2012. Allergies; Allergies  Allergen Reactions  . Pregabalin     Retains fluid  . Cefaclor Itching and Rash  . Cephalexin Itching and Rash  . Penicillins Rash   family history. Both parents had heart problems and died in 02-May-2001 few  months apart. Father was 66 and mother was 46. She has 2 brothers in good health and sister has heart problems. Paternal aunt died of cirrhosis due to NAFLD in her early 68s. Social history; She is divorced and does not have any children. She is retired. She worked at [employer] for 22 years and she also worked as a Insurance claims handler. She smokes cigarettes for 10 years about half a pack a day and quit in 05/02/98. She has never drank alcohol.  Objective: Blood pressure 118/68, pulse 74, temperature 97.8 F (36.6 C), temperature source Oral, resp. rate 20, height 5' 2"  (1.575 m), weight 220 lb (99.791 kg), last menstrual period 03/29/2011. Patient is alert and does not have asterixis. Conjunctiva is pink. Sclera is nonicteric She has few small spider angiomata over her face and one over her left scapular area. Oropharyngeal mucosa is normal. No neck masses or thyromegaly noted. Cardiac exam with regular rhythm normal S1 and S2. No murmur or gallop noted. Lungs are clear to auscultation. Abdomen is full. It is soft and nontender without masses or organomegaly. Liver edge is indistinct. No LE edema or clubbing noted.  Labs/studies  Ultrasound from 12/27/2011 reveals heterogeneous liver with diffuse echogenicity consistent with fatty liver and splenomegaly. No ascites present. Blood work from 12/23/2011. WBC 4.3, H&H 12.7 and 41.1 and platelet count 123K. Alpha-1 anti-trypsin 117 mg/DL(normal). Sedimentation rate 17, ANA negative. Serum iron 57 TIBC 481 saturation 12% and serum ferritin 16. Ceruloplasmin 36 mg/dl( normal). AFP 4.6 ng/mL INR  1.08. HCV antibody nonreactive. Hepatitis B surface antibody negative      Assessment:  Patient is 61 year old Caucasian female who has cirrhosis secondary to NAFLD. There is enough evidence to support this diagnosis and I do not believe liver biopsy is necessary. She has thrombocytopenia, splenomegaly and esophageal varices.   well-preserved. From  the pictures that are available esophageal varices appear to be small and will be reexcised in April 2014 which would be 6 months from her last exam. She can definitely improve her prognosis and delayed complications of cirrhosis are regular exercise and gradual weight loss. She will also need to be periodically screen for [facility]. While there is no need or indication for transplant evaluation at this time but she is not a candidate for future consideration because of coronary artery disease. At this point I do not feel that she needs to be on propranolol for primary prophylaxis for variceal bleed. #2. Intermittent small volume hematochezia would appear to be due to hemorrhoids. Large volume hematochezia or rectal bleeding in September 2013 would appear to be secondary to sigmoid diverticulosis. While her H&H is normal but iron studies suggest iron deficiency.     Plan:  Patient advised not to eat raw fish. Importance of regular exercise and gradual weight reduction emphasized to  the patient. She will contact Dr. Edythe Lynn office to schedule hepatitis A and B. Vaccination. Will get hepatitis B. surface antigen with next blood draw as this test has not been done as best as I can tell. Patient advised to report to emergency room should she experience melena or large-volume hematochezia. Will consider EGD following her next visit. She can continue aspirin at 81 mg daily but should refrain from using other OTC NSAIDs. Office visit in 4 months.  Lab data from 01/25/2012 WBC 3.9, H&H 12.2 and 38.7 platelet count 83K Bilirubin oh 0.2, AP 118, AST 47, ALT 20, albumin 4.5 creatinine oh 0.78 and BUN 10 serum calcium 9.3.

## 2012-02-06 NOTE — Patient Instructions (Signed)
Go back on iron pills and take 1 pill by mouth twice a week. Please ask Dr. Quintin Alto to give you hepatitis a and B. Vaccination. Do not eat raw fish or take OTC NSAIDs continue with low-dose aspirin. Must walk/exercise at least 5 times a week as discussed.

## 2012-02-09 ENCOUNTER — Encounter (INDEPENDENT_AMBULATORY_CARE_PROVIDER_SITE_OTHER): Payer: Self-pay

## 2012-02-27 DIAGNOSIS — Z23 Encounter for immunization: Secondary | ICD-10-CM | POA: Diagnosis not present

## 2012-02-27 DIAGNOSIS — K7460 Unspecified cirrhosis of liver: Secondary | ICD-10-CM | POA: Diagnosis not present

## 2012-03-07 ENCOUNTER — Encounter: Payer: Self-pay | Admitting: Cardiology

## 2012-03-07 ENCOUNTER — Ambulatory Visit (INDEPENDENT_AMBULATORY_CARE_PROVIDER_SITE_OTHER): Payer: Medicare Other | Admitting: Cardiology

## 2012-03-07 VITALS — BP 124/84 | HR 66 | Ht 62.0 in | Wt 222.0 lb

## 2012-03-07 DIAGNOSIS — I5042 Chronic combined systolic (congestive) and diastolic (congestive) heart failure: Secondary | ICD-10-CM

## 2012-03-07 DIAGNOSIS — I509 Heart failure, unspecified: Secondary | ICD-10-CM

## 2012-03-07 DIAGNOSIS — I2510 Atherosclerotic heart disease of native coronary artery without angina pectoris: Secondary | ICD-10-CM

## 2012-03-07 DIAGNOSIS — I25810 Atherosclerosis of coronary artery bypass graft(s) without angina pectoris: Secondary | ICD-10-CM

## 2012-03-07 DIAGNOSIS — E782 Mixed hyperlipidemia: Secondary | ICD-10-CM

## 2012-03-07 NOTE — Assessment & Plan Note (Addendum)
No recurrent angina, off of plavix.  Should remain off of plavix.  Has varices apparently.

## 2012-03-07 NOTE — Patient Instructions (Addendum)
Follow up with Dr.Stuckey in March 2014.

## 2012-03-07 NOTE — Progress Notes (Signed)
HPI:  Patient seen in followup. She does have a diagnosis of cirrhosis at this point in time is being followed by Dr. Laural Golden.  No current chest pain.  Question of recurrent infiltrate.  Has had some issues with volume, but now on regular dose furosemide.  Watching salt.  I reviewed Dr. Olevia Perches note in detail for appropriate coordination of care with cardiac decision making.    Current Outpatient Prescriptions  Medication Sig Dispense Refill  . ALPRAZolam (XANAX) 0.5 MG tablet Take 0.5 mg by mouth 3 (three) times daily as needed. For anxiety. Rarely takes.      [name] aspirin EC 81 MG tablet Take 81 mg by mouth daily.      [name] atorvastatin (LIPITOR) 20 MG tablet Take 20 mg by mouth daily.        [name] esomeprazole (NEXIUM) 40 MG capsule Take 40 mg by mouth daily before breakfast.       . ezetimibe (ZETIA) 10 MG tablet Take 10 mg by mouth daily.        [name] FLUoxetine (PROZAC) 20 MG tablet Take 20 mg by mouth daily.       . furosemide (LASIX) 40 MG tablet Take  1 1/2 tab daily      . HYDROcodone-acetaminophen (NORCO) 10-325 MG per tablet Take 1 tablet by mouth every 6 (six) hours as needed. For pain.      . isosorbide mononitrate (IMDUR) 30 MG CR tablet Take 30 mg by mouth daily.       . metoprolol succinate (TOPROL-XL) 25 MG 24 hr tablet Take 25 mg by mouth daily.        . valsartan (DIOVAN) 160 MG tablet Take one tab in the morning and 1/2 tab at bed time.        Allergies  Allergen Reactions  . Pregabalin     Retains fluid  . Cefaclor Itching and Rash  . Cephalexin Itching and Rash  . Penicillins Rash    Past Medical History  Diagnosis Date  . Rapid palpitations   . Coronary artery disease     Status post percutaneous coronary intervention and bare metal stenting  of the left circumflex and subsequent percutaneous transluminal coronary anioplasty and placement of a 3.0 NIR bare metal stent proximal to the previous stent.  . Hypertension   . Hyperlipidemia   . Obesity   . Gastroesophageal  reflux disease   . Depression   . Osteoarthritis   . Status post hysterectomy   . History of pneumonia   . H/O: hysterectomy   . CHF (congestive heart failure)   . Esophageal varices     New 2013    Past Surgical History  Procedure Date  . Coronary artery bypass graft May 31,2001    x 3 with a vein graft to the first diagonal, vein graft to the right coronary  artery, and a free left internal mammary  artery to the obtuse marginal   . Cardiac catheterization 2004    left internal mammary artery to the obtuse marginal  was found to be small and thread like.  The two grafts were patent.  The left circumflex had 90% in-stent restenosis and cutting balloon angioplasty was performed followed by placement of a 3.0 x 23mm Taxus drug -eluting stent.    . Cardiac catheterization 2006    There was in-stent restenosis in the left circumflex and this was treated with cutting balloon angioplasty   . Cardiac catheterization 2008    vein graft  to the to the obtuse marginal was patent, although small, left circumflex had 40% in-stent restenosis, ejection fraction 40-45%.  The patient was medically mananged.  . Tonsillectomy   . Joint replacement   . Rotator cuff left 2007  . Back surgery   . Abdominal hysterectomy   . Colonoscopy 03/29/2011    Procedure: COLONOSCOPY;  Surgeon: Jamesetta So, MD;  Location: [facility];  Service: Gastroenterology;  Laterality: N/A;  . Esophagogastroduodenoscopy 12/20/2011    Procedure: ESOPHAGOGASTRODUODENOSCOPY (EGD);  Surgeon: Jamesetta So, MD;  Location: [facility];  Service: Gastroenterology;  Laterality: N/A;    Family History  Problem Relation Age of Onset  . Heart attack Mother   . Heart attack Father 27    cause of death  . Coronary artery disease Sister     CABG   . Colon cancer Neg Hx     History   Social History  . Marital Status: Divorced    Spouse Name: N/A    Number of Children: N/A  . Years of Education: N/A   Occupational  History  . Disabled    Social History Main Topics  . Smoking status: Former Smoker -- 0.5 packs/day for 20 years    Types: Cigarettes    Quit date: 07/21/1995  . Smokeless tobacco: Never Used  . Alcohol Use: No  . Drug Use: No  . Sexually Active: Not on file   Other Topics Concern  . Not on file   Social History Narrative   Lives in [place] by herself.      ROS: Please see the HPI.  All other systems reviewed and negative.  PHYSICAL EXAM:  BP 124/84  Pulse 66  Ht 5\' 2"  (1.575 m)  Wt 222 lb (100.699 kg)  BMI 40.60 kg/m2  SpO2 95%  LMP 03/29/2011  General: Well developed, well nourished, in no acute distress. Head:  Normocephalic and atraumatic. Neck: no JVD Lungs: Clear to auscultation and percussion. Heart: Normal S1 and S2.  MIld apical murmur.    Abdomen:  Normal bowel sounds; soft; non tender; no organomegaly Pulses: Pulses normal in all 4 extremities. Extremities: No clubbing or cyanosis. No edema. Neurologic: Alert and oriented x 3.  EKG:  NSR.  LVH.  Occasional PVC.  Rightward axis.    ECHO  Study Conclusions  - Left ventricle: LVEF is approximately 50% with lateral, inferior and posterior hypokinesis. The cavity size was normal. Doppler parameters are consistent with abnormal left ventricular relaxation (grade 1 diastolic dysfunction). - Mitral valve: Mild regurgitation. - Left atrium: The atrium was moderately dilated.    ASSESSMENT AND PLAN:

## 2012-03-12 NOTE — Assessment & Plan Note (Signed)
She continues to remain stable from our standpoint.  She tends toward volume overload at times.  Her echo is consistent with mild LV dysfunction on the basis of CAD, and she undoubtedly has components of diastolic dysfunction as well.  Continued close follow up is recommended.

## 2012-03-12 NOTE — Assessment & Plan Note (Signed)
Remains on fairly low dose statins.  No changes made by GI.

## 2012-04-08 NOTE — Progress Notes (Signed)
Dr. Maren Beach notes appreciated.

## 2012-05-08 ENCOUNTER — Ambulatory Visit: Payer: Medicare Other | Admitting: Cardiology

## 2012-06-05 ENCOUNTER — Ambulatory Visit (INDEPENDENT_AMBULATORY_CARE_PROVIDER_SITE_OTHER): Payer: Medicare Other | Admitting: Internal Medicine

## 2012-06-11 ENCOUNTER — Encounter: Payer: Self-pay | Admitting: Cardiology

## 2012-06-11 ENCOUNTER — Ambulatory Visit (INDEPENDENT_AMBULATORY_CARE_PROVIDER_SITE_OTHER): Payer: Medicare Other | Admitting: Cardiology

## 2012-06-11 VITALS — BP 142/78 | HR 74 | Ht 62.0 in | Wt 223.0 lb

## 2012-06-11 DIAGNOSIS — D649 Anemia, unspecified: Secondary | ICD-10-CM

## 2012-06-11 DIAGNOSIS — I2510 Atherosclerotic heart disease of native coronary artery without angina pectoris: Secondary | ICD-10-CM

## 2012-06-11 DIAGNOSIS — E782 Mixed hyperlipidemia: Secondary | ICD-10-CM

## 2012-06-11 DIAGNOSIS — I10 Essential (primary) hypertension: Secondary | ICD-10-CM

## 2012-06-11 DIAGNOSIS — I509 Heart failure, unspecified: Secondary | ICD-10-CM

## 2012-06-11 DIAGNOSIS — K7460 Unspecified cirrhosis of liver: Secondary | ICD-10-CM

## 2012-06-11 DIAGNOSIS — I5042 Chronic combined systolic (congestive) and diastolic (congestive) heart failure: Secondary | ICD-10-CM

## 2012-06-11 NOTE — Patient Instructions (Addendum)
Your physician wants you to follow-up in:  3 months with Dr. Burt Knack.  You will receive a reminder letter in the mail two months in advance. If you don't receive a letter, please call our office to schedule the follow-up appointment.

## 2012-06-11 NOTE — Progress Notes (Signed)
HPI:  This nice patient returns today in a followup visit. She is been seen by gastroenterology up in the evening, and they have confirmed her that she does have cirrhosis. She was concerned about the option of not taking her Plavix, so she started taking every other day thinking that her chest pain was somewhat improved. However, we  had an extensive discussion about this. She's not had symptoms of heart failure recently, and is been getting along reasonably well.  She has also had a sleep study recently.  She maintains very positive attitude about her care.    Current Outpatient Prescriptions  Medication Sig Dispense Refill  . ALPRAZolam (XANAX) 0.5 MG tablet Take 0.5 mg by mouth 3 (three) times daily as needed. For anxiety. Rarely takes.      [name] aspirin EC 81 MG tablet Take 81 mg by mouth daily.      [name] atorvastatin (LIPITOR) 20 MG tablet Take 20 mg by mouth daily.        [name] esomeprazole (NEXIUM) 40 MG capsule Take 40 mg by mouth daily before breakfast.       . ezetimibe (ZETIA) 10 MG tablet Take 10 mg by mouth daily.        [name] FLUoxetine (PROZAC) 20 MG tablet Take 20 mg by mouth daily.       . furosemide (LASIX) 40 MG tablet Take  1 1/2 tab daily      . HYDROcodone-acetaminophen (NORCO) 10-325 MG per tablet Take 1 tablet by mouth every 6 (six) hours as needed. For pain.      . isosorbide mononitrate (IMDUR) 30 MG CR tablet Take 30 mg by mouth daily.       . metoprolol succinate (TOPROL-XL) 25 MG 24 hr tablet Take 25 mg by mouth daily.        . valsartan (DIOVAN) 160 MG tablet Take one tab in the morning and 1/2 tab at bed time.      [name] NITROSTAT 0.4 MG SL tablet Take as needed for chest pain       No current facility-administered medications for this visit.    Allergies  Allergen Reactions  . Pregabalin     Retains fluid  . Cefaclor Itching and Rash  . Cephalexin Itching and Rash  . Penicillins Rash    Past Medical History  Diagnosis Date  . Rapid palpitations   . Coronary artery  disease     Status post percutaneous coronary intervention and bare metal stenting  of the left circumflex and subsequent percutaneous transluminal coronary anioplasty and placement of a 3.0 NIR bare metal stent proximal to the previous stent.  . Hypertension   . Hyperlipidemia   . Obesity   . Gastroesophageal reflux disease   . Depression   . Osteoarthritis   . Status post hysterectomy   . History of pneumonia   . H/O: hysterectomy   . CHF (congestive heart failure)   . Esophageal varices     New 2013    Past Surgical History  Procedure Laterality Date  . Coronary artery bypass graft  May 31,2001    x 3 with a vein graft to the first diagonal, vein graft to the right coronary  artery, and a free left internal mammary  artery to the obtuse marginal   . Cardiac catheterization  2004    left internal mammary artery to the obtuse marginal  was found to be small and thread like.  The two grafts were patent.  The  left circumflex had 90% in-stent restenosis and cutting balloon angioplasty was performed followed by placement of a 3.0 x 48mm Taxus drug -eluting stent.    . Cardiac catheterization  2006    There was in-stent restenosis in the left circumflex and this was treated with cutting balloon angioplasty   . Cardiac catheterization  2008    vein graft to the to the obtuse marginal was patent, although small, left circumflex had 40% in-stent restenosis, ejection fraction 40-45%.  The patient was medically mananged.  . Tonsillectomy    . Joint replacement    . Rotator cuff left  2007  . Back surgery    . Abdominal hysterectomy    . Colonoscopy  03/29/2011    Procedure: COLONOSCOPY;  Surgeon: Jamesetta So, MD;  Location: [facility];  Service: Gastroenterology;  Laterality: N/A;  . Esophagogastroduodenoscopy  12/20/2011    Procedure: ESOPHAGOGASTRODUODENOSCOPY (EGD);  Surgeon: Jamesetta So, MD;  Location: [facility];  Service: Gastroenterology;  Laterality: N/A;    Family  History  Problem Relation Age of Onset  . Heart attack Mother   . Heart attack Father 56    cause of death  . Coronary artery disease Sister     CABG   . Colon cancer Neg Hx     History   Social History  . Marital Status: Divorced    Spouse Name: N/A    Number of Children: N/A  . Years of Education: N/A   Occupational History  . Disabled    Social History Main Topics  . Smoking status: Former Smoker -- 0.50 packs/day for 20 years    Types: Cigarettes    Quit date: 07/21/1995  . Smokeless tobacco: Never Used  . Alcohol Use: No  . Drug Use: No  . Sexually Active: Not on file   Other Topics Concern  . Not on file   Social History Narrative   Lives in [place] by herself.      ROS: Please see the HPI.  All other systems reviewed and negative.  PHYSICAL EXAM:  BP 142/78  Pulse 74  Ht 5\' 2"  (1.575 m)  Wt 223 lb (101.152 kg)  BMI 40.78 kg/m2  SpO2 96%  LMP 03/29/2011  General: Well developed, well nourished, in no acute distress. Head:  Normocephalic and atraumatic. Neck: no JVD Lungs: Clear to auscultation and percussion. Heart: Normal S1 and S2.  No murmur, rubs or gallops.  Abdomen:  Normal bowel sounds; soft; non tender; no organomegaly Pulses: Pulses normal in all 4 extremities. Extremities: No clubbing or cyanosis. No edema. Stockings in place Neurologic: Alert and oriented x 3.  EKG:  NSR. Delay in R wave progression, likely secondary to lead placement.  No acute changes.    ASSESSMENT AND PLAN:

## 2012-06-12 ENCOUNTER — Other Ambulatory Visit (INDEPENDENT_AMBULATORY_CARE_PROVIDER_SITE_OTHER): Payer: Self-pay | Admitting: *Deleted

## 2012-06-12 ENCOUNTER — Encounter (INDEPENDENT_AMBULATORY_CARE_PROVIDER_SITE_OTHER): Payer: Self-pay | Admitting: Internal Medicine

## 2012-06-12 ENCOUNTER — Ambulatory Visit (INDEPENDENT_AMBULATORY_CARE_PROVIDER_SITE_OTHER): Payer: Medicare Other | Admitting: Internal Medicine

## 2012-06-12 ENCOUNTER — Encounter (INDEPENDENT_AMBULATORY_CARE_PROVIDER_SITE_OTHER): Payer: Self-pay | Admitting: *Deleted

## 2012-06-12 VITALS — BP 130/74 | HR 76 | Temp 98.2°F | Resp 18 | Ht 62.0 in | Wt 224.1 lb

## 2012-06-12 DIAGNOSIS — K7460 Unspecified cirrhosis of liver: Secondary | ICD-10-CM

## 2012-06-12 DIAGNOSIS — IMO0001 Reserved for inherently not codable concepts without codable children: Secondary | ICD-10-CM

## 2012-06-12 DIAGNOSIS — M7918 Myalgia, other site: Secondary | ICD-10-CM

## 2012-06-12 NOTE — Patient Instructions (Signed)
Esophagogastroduodenoscopy to be scheduled. Physician will contact you with results of blood work. Will also schedule for elastography when available.

## 2012-06-12 NOTE — Progress Notes (Signed)
Presenting complaint;  Followup for cirrhosis secondary to NAFLD. Patient complains of right upper quadrant/rib cage pain.  Subjective:  Patient is 62 year old Caucasian female who is cirrhosis secondary to NAFLD and was last seen in December 2013 presents for scheduled visit. She says she's been or active lately but does not do schedule exercise. She is somewhat limited because of back pain. Now she is complaining of right upper quadrant pain which started few days ago. She had similar pain last year that resolved after few months. This pain is not made worse with meals or associated with nausea vomiting or dysuria. She has good appetite. She has noted when she bends forward she has pain and feels as if she has not had the right rib cage. She takes Xanax rarely. Heartburn is well controlled with therapy. Her bowels move daily she denies melena or rectal bleeding.  Current Medications: Current Outpatient Prescriptions  Medication Sig Dispense Refill  . ALPRAZolam (XANAX) 0.5 MG tablet Take 0.5 mg by mouth 3 (three) times daily as needed. For anxiety. Rarely takes.      [name] aspirin EC 81 MG tablet Take 81 mg by mouth daily.      [name] atorvastatin (LIPITOR) 20 MG tablet Take 20 mg by mouth daily.        . cetirizine (ZYRTEC) 10 MG tablet Take 10 mg by mouth as needed for allergies. Patient states that she only takes this during allergy season      . esomeprazole (NEXIUM) 40 MG capsule Take 40 mg by mouth daily before breakfast.       . ezetimibe (ZETIA) 10 MG tablet Take 10 mg by mouth daily.        [name] FLUoxetine (PROZAC) 20 MG tablet Take 20 mg by mouth daily.       . furosemide (LASIX) 40 MG tablet Take  1 1/2 tab daily      . HYDROcodone-acetaminophen (NORCO) 10-325 MG per tablet Take 1 tablet by mouth every 6 (six) hours as needed. For pain.      . isosorbide mononitrate (IMDUR) 30 MG CR tablet Take 30 mg by mouth daily.       . metoprolol succinate (TOPROL-XL) 25 MG 24 hr tablet Take 25 mg by  mouth daily.        [name] NITROSTAT 0.4 MG SL tablet Take as needed for chest pain      . valsartan (DIOVAN) 160 MG tablet Take one tab in the morning and 1/2 tab at bed time.       No current facility-administered medications for this visit.     Objective: Blood pressure 130/74, pulse 76, temperature 98.2 F (36.8 C), temperature source Oral, resp. rate 18, height 5' 2"  (1.575 m), weight 224 lb 1.6 oz (101.651 kg), last menstrual period 03/29/2011. Asian is alert and in no acute distress. Conjunctiva is pink. Sclera is nonicteric Oropharyngeal mucosa is normal. No neck masses or thyromegaly noted. Cardiac exam with regular rhythm normal S1 and S2. No murmur or gallop noted. Lungs are clear to auscultation. Abdomen is full but soft and nontender without organomegaly or masses. She has localized tenderness over right costal margin. No LE edema or clubbing noted.  Labs/studies Results:  Recent lab studies requested from Dr. Edythe Lynn office.  Assessment:  #1. Cirrhosis secondary to NAFLD. Patient has well preserved hepatic function. She must exercise and lose weight if she wants to slow down progression of her liver disease. #2. Musculoskeletal right upper quadrant/rib cage pain.  Plan:  Requests copy of recent blood work from Dr. Quintin Alto is office. Hepatitis B surface antigen. AFP. Esophagogastroduodenoscopy and esophageal variceal banding if indicated. Elastography. to be scheduled next month(it should be available by then). Office visit in six-months.

## 2012-06-13 LAB — HEPATITIS B SURFACE ANTIGEN: Hepatitis B Surface Ag: NEGATIVE

## 2012-06-13 LAB — AFP TUMOR MARKER: AFP-Tumor Marker: 3.6 ng/mL (ref 0.0–8.0)

## 2012-06-16 NOTE — Assessment & Plan Note (Signed)
This is followed by Dr. Quintin Alto in [place].

## 2012-06-16 NOTE — Assessment & Plan Note (Signed)
She is prone to fluid overload.  However, she is doing pretty well.  I agree with continued evaluation of OSA, as this is a contributor.  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  She is stable on daily furosemide.  Repeat echo may be warranted on her followup.

## 2012-06-16 NOTE — Assessment & Plan Note (Signed)
Being followed by Dr. Laural Golden on a careful basis.  Workup is in progress.

## 2012-06-16 NOTE — Assessment & Plan Note (Signed)
The patient has had BMS to the CFX, then restenosis, then DES for BMS ISR, then restenosis again, the cutting balloon PCI of the CFX stent.  Two follow up caths have demonstrated patency.  We had maintained long term DAPT dt the use of first gen DES for ISR, but she has varices, some bleeding, and with restenotic tissue and stable PTCA site seems most appropriate to stop clopidogrel.  She will maintain low dose ASA.  We will arrange fu here with Dr. Burt Knack who is most familiar with stent related issues.  I have instructed her not to take clopidogrel with the longer term risk of bleeding associated with her esophageal varices.  She understands with careful explantation.

## 2012-06-16 NOTE — Assessment & Plan Note (Signed)
Was related to use of drug, which was stopped, and improved.  Was seen by heme up in [place].

## 2012-06-20 ENCOUNTER — Encounter (INDEPENDENT_AMBULATORY_CARE_PROVIDER_SITE_OTHER): Payer: Self-pay

## 2012-06-21 ENCOUNTER — Encounter (HOSPITAL_COMMUNITY): Payer: Self-pay | Admitting: Pharmacy Technician

## 2012-07-04 ENCOUNTER — Ambulatory Visit (HOSPITAL_COMMUNITY)
Admission: RE | Admit: 2012-07-04 | Discharge: 2012-07-04 | Disposition: A | Discharge status: Home / Self Care | Payer: Medicare Other | Source: Ambulatory Visit | Attending: Internal Medicine | Admitting: Internal Medicine

## 2012-07-04 ENCOUNTER — Encounter (HOSPITAL_COMMUNITY): Payer: Self-pay | Admitting: *Deleted

## 2012-07-04 ENCOUNTER — Encounter (HOSPITAL_COMMUNITY)
Admission: RE | Disposition: A | Discharge status: Home / Self Care | Payer: Self-pay | Source: Ambulatory Visit | Attending: Internal Medicine

## 2012-07-04 DIAGNOSIS — K7460 Unspecified cirrhosis of liver: Secondary | ICD-10-CM

## 2012-07-04 DIAGNOSIS — K766 Portal hypertension: Secondary | ICD-10-CM

## 2012-07-04 DIAGNOSIS — I8500 Esophageal varices without bleeding: Secondary | ICD-10-CM

## 2012-07-04 DIAGNOSIS — I10 Essential (primary) hypertension: Secondary | ICD-10-CM | POA: Insufficient documentation

## 2012-07-04 DIAGNOSIS — K319 Disease of stomach and duodenum, unspecified: Secondary | ICD-10-CM | POA: Insufficient documentation

## 2012-07-04 DIAGNOSIS — R1011 Right upper quadrant pain: Secondary | ICD-10-CM

## 2012-07-04 DIAGNOSIS — K7689 Other specified diseases of liver: Secondary | ICD-10-CM

## 2012-07-04 DIAGNOSIS — I8510 Secondary esophageal varices without bleeding: Secondary | ICD-10-CM | POA: Insufficient documentation

## 2012-07-04 HISTORY — PX: ESOPHAGEAL BANDING: SHX5518

## 2012-07-04 HISTORY — DX: Unspecified cirrhosis of liver: K74.60

## 2012-07-04 HISTORY — PX: ESOPHAGOGASTRODUODENOSCOPY: SHX5428

## 2012-07-04 SURGERY — EGD (ESOPHAGOGASTRODUODENOSCOPY)
Anesthesia: Moderate Sedation

## 2012-07-04 MED ORDER — MIDAZOLAM HCL 5 MG/5ML IJ SOLN
INTRAMUSCULAR | Status: DC | PRN
  Administered 2012-07-04 (×5): 2 mg via INTRAVENOUS

## 2012-07-04 MED ORDER — BUTAMBEN-TETRACAINE-BENZOCAINE 2-2-14 % EX AERO
INHALATION_SPRAY | CUTANEOUS | Status: DC | PRN
  Administered 2012-07-04: 2 via TOPICAL

## 2012-07-04 MED ORDER — MEPERIDINE HCL 50 MG/ML IJ SOLN
INTRAMUSCULAR | Status: AC
  Filled 2012-07-04: qty 1

## 2012-07-04 MED ORDER — MIDAZOLAM HCL 5 MG/5ML IJ SOLN
INTRAMUSCULAR | Status: AC
  Filled 2012-07-04: qty 10

## 2012-07-04 MED ORDER — STERILE WATER FOR IRRIGATION IR SOLN
Status: DC | PRN
  Administered 2012-07-04: 15:00:00

## 2012-07-04 MED ORDER — SODIUM CHLORIDE 0.9 % IV SOLN
INTRAVENOUS | Status: DC
  Administered 2012-07-04: 1000 mL via INTRAVENOUS

## 2012-07-04 MED ORDER — MEPERIDINE HCL 25 MG/ML IJ SOLN
INTRAMUSCULAR | Status: DC | PRN
  Administered 2012-07-04 (×2): 25 mg via INTRAVENOUS

## 2012-07-04 MED ORDER — MIDAZOLAM HCL 5 MG/5ML IJ SOLN
INTRAMUSCULAR | Status: AC
  Filled 2012-07-04: qty 5

## 2012-07-04 MED ORDER — SUCRALFATE 1 GM/10ML PO SUSP: 1.0000 g | Freq: Four times a day (QID) | ORAL | Status: DC

## 2012-07-04 NOTE — Op Note (Signed)
EGD PROCEDURE REPORT  PATIENT:  Samantha [name]  MR#:  [id number] Birthdate:  Jul 14, 1950, 62 y.o., female Endoscopist:  Dr. Rogene Houston, MD Referred By:  Dr. [name], MD  Procedure Date: 07/04/2012  Procedure:   EGD with esophageal variceal banding.  Indications:  Patient is 62 year old Caucasian female with history of cirrhosis secondary to NAFLD who was found to have esophageal varices in October last year when she was evaluated by Dr. Aviva Signs. She is returning for reevaluation and if varices are gotten bigger she will undergo banding for primary prophylaxis.            Informed Consent:  The risks, benefits, alternatives & imponderables which include, but are not limited to, bleeding, infection, perforation, drug reaction and potential missed lesion have been reviewed.  The potential for biopsy, lesion removal, esophageal dilation, etc. have also been discussed.  Questions have been answered.  All parties agreeable.  Please see history & physical in medical record for more information.  Medications:  Demerol 50 mg IV Versed 10 mg IV Cetacaine spray topically for oropharyngeal anesthesia  Description of procedure:  The endoscope was introduced through the mouth and advanced to the second portion of the duodenum without difficulty or limitations. The mucosal surfaces were surveyed very carefully during advancement of the scope and upon withdrawal.  Findings:  Esophagus:  Mucosa of the proximal and middle segment was normal. 3 columns of varices identified in the distal 7-8 cm. One column with small and the others were grade 3 and larger when compared to last EGD of October 2013. GEJ:  40 cm Stomach:  Stomach was empty and distended very well with insufflation. Folds in the proximal stomach were normal. Examination of mucosa revealed mosaic pattern or snake skinning at gastric body. Antral mucosa was normal. Although the child was taken. Angularis fundus and cardia was examined by  retroflexion of scope and were normal. Duodenum:  Normal bulbar and post bulbar mucosa.  Therapeutic/Diagnostic Maneuvers Performed:   Endoscope was withdrawn and multiple banding device was loaded onto the scope which was reintroduced into esophagus. Both columns were banded and no bleeding induced during this process. Each column was banded only once.  Complications:  None  Impression: Three columns of esophageal varices two of which were grade III and were banded. Portal gastropathy.  Recommendations:  Full liquids for 24 hours. Sucralfate 1 g by mouth a.c. and each bedtime for 2 weeks. Office visit in 8 weeks and next banding session following that visit.  Jamilah Jean U  07/04/2012  3:14 PM  CC: Dr. [name], MD & Dr. Rayne Du ref. provider found

## 2012-07-04 NOTE — H&P (Signed)
Samantha [name] is an 62 y.o. female.   Chief Complaint: Patient is here for EGD and possible esophageal variceal banding. HPI: Patient 62 year old Caucasian female with history of cirrhosis secondary to NAFLD who was found to have 2 columns of his varices in October 2002 by Dr. Aviva Signs. She is undergoing EGD to reevaluated her varices and if they have enlarged in size these will be banded. Itching continues to complain of intermittent right rib cage pain. She states she is walking or treadmill on regular basis now.  Past Medical History  Diagnosis Date  . Rapid palpitations   . Coronary artery disease     Status post percutaneous coronary intervention and bare metal stenting  of the left circumflex and subsequent percutaneous transluminal coronary anioplasty and placement of a 3.0 NIR bare metal stent proximal to the previous stent.  . Hypertension   . Hyperlipidemia   . Obesity   . Gastroesophageal reflux disease   . Depression   . Osteoarthritis   . Status post hysterectomy   . History of pneumonia   . H/O: hysterectomy   . CHF (congestive heart failure)   . Esophageal varices     New 2013  . Cirrhosis of liver     Past Surgical History  Procedure Laterality Date  . Coronary artery bypass graft  May 31,2001    x 3 with a vein graft to the first diagonal, vein graft to the right coronary  artery, and a free left internal mammary  artery to the obtuse marginal   . Cardiac catheterization  2004    left internal mammary artery to the obtuse marginal  was found to be small and thread like.  The two grafts were patent.  The left circumflex had 90% in-stent restenosis and cutting balloon angioplasty was performed followed by placement of a 3.0 x 39m Taxus drug -eluting stent.    . Cardiac catheterization  2006    There was in-stent restenosis in the left circumflex and this was treated with cutting balloon angioplasty   . Cardiac catheterization  2008    vein graft to the to the  obtuse marginal was patent, although small, left circumflex had 40% in-stent restenosis, ejection fraction 40-45%.  The patient was medically mananged.  . Tonsillectomy    . Joint replacement    . Rotator cuff left  2007  . Back surgery    . Abdominal hysterectomy    . Colonoscopy  03/29/2011    Procedure: COLONOSCOPY;  Surgeon: MJamesetta So MD;  [facility];  Service: Gastroenterology;  Laterality: N/A;  . Esophagogastroduodenoscopy  12/20/2011    Procedure: ESOPHAGOGASTRODUODENOSCOPY (EGD);  Surgeon: MJamesetta So MD;  Location: [facility];  Service: Gastroenterology;  Laterality: N/A;    Family History  Problem Relation Age of Onset  . Heart attack Mother   . Heart attack Father 768   cause of death  . Coronary artery disease Sister     CABG   . Colon cancer Neg Hx    Social History:  reports that she quit smoking about 16 years ago. Her smoking use included Cigarettes. She has a 10 pack-year smoking history. She has never used smokeless tobacco. She reports that she does not drink alcohol or use illicit drugs.  Allergies:  Allergies  Allergen Reactions  . Pregabalin     Retains fluid  . Cefaclor Itching and Rash  . Cephalexin Itching and Rash  . Penicillins Rash  Medications Prior to Admission  Medication Sig Dispense Refill  . ALPRAZolam (XANAX) 0.5 MG tablet Take 0.5 mg by mouth 3 (three) times daily as needed. For anxiety. Rarely takes.      [name] aspirin EC 81 MG tablet Take 81 mg by mouth daily.      [name] atorvastatin (LIPITOR) 20 MG tablet Take 20 mg by mouth daily.        . cetirizine (ZYRTEC) 10 MG tablet Take 10 mg by mouth as needed for allergies. Patient states that she only takes this during allergy season      . esomeprazole (NEXIUM) 40 MG capsule Take 40 mg by mouth daily before breakfast.       . ezetimibe (ZETIA) 10 MG tablet Take 10 mg by mouth daily.        [name] FLUoxetine (PROZAC) 20 MG tablet Take 20 mg by mouth daily.       . furosemide  (LASIX) 40 MG tablet Take  1 1/2 tab daily      . HYDROcodone-acetaminophen (NORCO) 10-325 MG per tablet Take 1 tablet by mouth every 6 (six) hours as needed. For pain.      . isosorbide mononitrate (IMDUR) 30 MG CR tablet Take 30 mg by mouth daily.       . metoprolol succinate (TOPROL-XL) 25 MG 24 hr tablet Take 25 mg by mouth daily.        . valsartan (DIOVAN) 160 MG tablet Take 80-160 mg by mouth 2 (two) times daily. Take one tab in the morning and 1/2 tab at bed time.      [name] NITROSTAT 0.4 MG SL tablet Take as needed for chest pain        No results found for this or any previous visit (from the past 48 hour(s)). No results found.  ROS  Blood pressure 128/65, pulse 66, temperature 98.3 F (36.8 C), temperature source Oral, resp. rate 29, height 5' 2"  (1.575 m), weight 220 lb (99.791 kg), last menstrual period 03/29/2011, SpO2 99.00%. Physical Exam  Constitutional: She appears well-developed and well-nourished.  HENT:  Mouth/Throat: Oropharynx is clear and moist.  Eyes: Conjunctivae are normal. No scleral icterus.  Neck: No thyromegaly present.  Cardiovascular: Normal rate, regular rhythm and normal heart sounds.   No murmur heard. Respiratory: Effort normal and breath sounds normal.  GI: Soft. Bowel sounds are normal. She exhibits no mass. There is no tenderness.  Musculoskeletal: She exhibits no edema.  Lymphadenopathy:    She has no cervical adenopathy.  Neurological: She is alert.  Skin: Skin is warm and dry.     Assessment/Plan Esophageal varices secondary to cirrhosis. EGD and possible esophageal variceal banding.  Kadian Barcellos U 07/04/2012, 2:39 PM

## 2012-07-05 ENCOUNTER — Telehealth (INDEPENDENT_AMBULATORY_CARE_PROVIDER_SITE_OTHER): Payer: Self-pay | Admitting: *Deleted

## 2012-07-05 ENCOUNTER — Encounter (HOSPITAL_COMMUNITY): Payer: Self-pay | Admitting: Internal Medicine

## 2012-07-05 NOTE — Telephone Encounter (Signed)
Dr.Rehman calling patient

## 2012-07-05 NOTE — Telephone Encounter (Signed)
Recommendations:  Full liquids for 24 hours.  Sucralfate 1 g by mouth a.c. and each bedtime for 2 weeks.  Office visit in 8 weeks and next banding session following that visit.  I call patient and she states that she is  Or has been following her instructions. She has been on a full liquid diet. She states that she feels like she is choking, has been lying down and sleeping. Patient advised that Dr.Rehman would be paged , and she will be notified.

## 2012-07-05 NOTE — Telephone Encounter (Signed)
Samantha [name] LM stating she is having really bad indigestion and feels like she is choking. Samantha [name] had a procedure yesterday and would like to know if this is normal? Her return phone number is [phone].

## 2012-08-28 ENCOUNTER — Encounter (INDEPENDENT_AMBULATORY_CARE_PROVIDER_SITE_OTHER): Payer: Self-pay | Admitting: Internal Medicine

## 2012-08-28 ENCOUNTER — Other Ambulatory Visit (INDEPENDENT_AMBULATORY_CARE_PROVIDER_SITE_OTHER): Payer: Self-pay | Admitting: *Deleted

## 2012-08-28 ENCOUNTER — Encounter (INDEPENDENT_AMBULATORY_CARE_PROVIDER_SITE_OTHER): Payer: Self-pay | Admitting: *Deleted

## 2012-08-28 ENCOUNTER — Ambulatory Visit (INDEPENDENT_AMBULATORY_CARE_PROVIDER_SITE_OTHER): Payer: Medicare Other | Admitting: Internal Medicine

## 2012-08-28 VITALS — BP 152/62 | HR 72 | Temp 98.2°F | Ht 62.0 in | Wt 223.8 lb

## 2012-08-28 DIAGNOSIS — I8500 Esophageal varices without bleeding: Secondary | ICD-10-CM

## 2012-08-28 DIAGNOSIS — K7460 Unspecified cirrhosis of liver: Secondary | ICD-10-CM

## 2012-08-28 NOTE — Progress Notes (Signed)
Subjective:     Patient ID: Samantha [name], female   DOB: 1950-09-08, 62 y.o.   MRN: [id number]  HPIHere today for f/u after undergoing EGD with banding 07/04/2012. She was evaluated by Dr. Arnoldo Morale for GI bleed and underwent an EGD 12/20/2011 and noted to have 2 colums of esophageal varices. Felt she had cirrhosis. Found to have a fatty liver approximately 10 yrs ago by Dr. Quintin Alto.  No hx of tattoos or IV drug use.  She tells me me is doing. She feels fine. Her appetite is good. No weight loss. No melena or bright red rectal bleeding. BMs are normal. Usually has a BM every other day.     Hepatitis B and C markers were negative. ANA, Alpha 1 antitrypsin negative. ceruploplasma negative. Just finished Hepatitis A and B vaccine.     Dr. Gentry Fitz recommendations  Office visit in 8 weeks and next banding session following that visit.       Review of Systems see hpi Current Outpatient Prescriptions  Medication Sig Dispense Refill  . ALPRAZolam (XANAX) 0.5 MG tablet Take 0.5 mg by mouth 3 (three) times daily as needed. For anxiety. Rarely takes.      [name] atorvastatin (LIPITOR) 20 MG tablet Take 20 mg by mouth daily.        [name] esomeprazole (NEXIUM) 40 MG capsule Take 40 mg by mouth daily before breakfast.       . ezetimibe (ZETIA) 10 MG tablet Take 10 mg by mouth daily.        [name] FLUoxetine (PROZAC) 20 MG tablet Take 20 mg by mouth daily.       . furosemide (LASIX) 40 MG tablet Take  1 1/2 tab daily      . HYDROcodone-acetaminophen (NORCO) 10-325 MG per tablet Take 1 tablet by mouth every 6 (six) hours as needed. For pain.      . isosorbide mononitrate (IMDUR) 30 MG CR tablet Take 30 mg by mouth daily.       . metoprolol succinate (TOPROL-XL) 25 MG 24 hr tablet Take 25 mg by mouth daily.        . valsartan (DIOVAN) 160 MG tablet Take 80-160 mg by mouth 2 (two) times daily. Take one tab in the morning and 1/2 tab at bed time.      . cetirizine (ZYRTEC) 10 MG tablet Take 10 mg by mouth as needed  for allergies. Patient states that she only takes this during allergy season       No current facility-administered medications for this visit.   Past Medical History  Diagnosis Date  . Rapid palpitations   . Coronary artery disease     Status post percutaneous coronary intervention and bare metal stenting  of the left circumflex and subsequent percutaneous transluminal coronary anioplasty and placement of a 3.0 NIR bare metal stent proximal to the previous stent.  . Hypertension   . Hyperlipidemia   . Obesity   . Gastroesophageal reflux disease   . Depression   . Osteoarthritis   . Status post hysterectomy   . History of pneumonia   . H/O: hysterectomy   . CHF (congestive heart failure)   . Esophageal varices     New 2013  . Cirrhosis of liver    Past Surgical History  Procedure Laterality Date  . Coronary artery bypass graft  May 31,2001    x 3 with a vein graft to the first diagonal, vein graft to the right coronary  artery, and a  free left internal mammary  artery to the obtuse marginal   . Cardiac catheterization  2004    left internal mammary artery to the obtuse marginal  was found to be small and thread like.  The two grafts were patent.  The left circumflex had 90% in-stent restenosis and cutting balloon angioplasty was performed followed by placement of a 3.0 x 67mm Taxus drug -eluting stent.    . Cardiac catheterization  2006    There was in-stent restenosis in the left circumflex and this was treated with cutting balloon angioplasty   . Cardiac catheterization  2008    vein graft to the to the obtuse marginal was patent, although small, left circumflex had 40% in-stent restenosis, ejection fraction 40-45%.  The patient was medically mananged.  . Tonsillectomy    . Joint replacement    . Rotator cuff left  2007  . Back surgery    . Abdominal hysterectomy    . Colonoscopy  03/29/2011    Procedure: COLONOSCOPY;  Surgeon: Jamesetta So, MD;  [facility]: [facility];   Service: Gastroenterology;  Laterality: N/A;  . Esophagogastroduodenoscopy  12/20/2011    Procedure: ESOPHAGOGASTRODUODENOSCOPY (EGD);  Surgeon: Jamesetta So, MD;  [facility]: [facility];  Service: Gastroenterology;  Laterality: N/A;  . Esophagogastroduodenoscopy N/A 07/04/2012    Procedure: ESOPHAGOGASTRODUODENOSCOPY (EGD);  Surgeon: Rogene Houston, MD;  [facility]: [facility];  Service: Endoscopy;  Laterality: N/A;  235-moved to 255 Ann to notify pt  . Esophageal banding N/A 07/04/2012    Procedure: ESOPHAGEAL BANDING;  Surgeon: Rogene Houston, MD;  [facility];  Service: Endoscopy;  Laterality: N/A;        Objective:   Physical Exam  Filed Vitals:   08/28/12 1028  BP: 152/62  Pulse: 72  Temp: 98.2 F (36.8 C)  Height: 5\' 2"  (1.575 m)  Weight: 223 lb 12.8 oz (101.515 kg)   Alert and oriented. Skin warm and dry. Oral mucosa is moist.   . Sclera anicteric, conjunctivae is pink. Thyroid not enlarged. No cervical lymphadenopathy. Lungs clear. Heart regular rate and rhythm.  Abdomen is soft. Bowel sounds are positive. No hepatomegaly. No abdominal masses felt. No tenderness.   .      Assessment:    NAFLD. Esophageal varices    Plan:    EGD with esophageal banding.

## 2012-08-28 NOTE — Patient Instructions (Addendum)
EGD with banding.

## 2012-09-05 ENCOUNTER — Encounter (HOSPITAL_COMMUNITY): Payer: Self-pay | Admitting: Pharmacy Technician

## 2012-09-17 ENCOUNTER — Other Ambulatory Visit (INDEPENDENT_AMBULATORY_CARE_PROVIDER_SITE_OTHER): Payer: Self-pay | Admitting: Internal Medicine

## 2012-09-17 ENCOUNTER — Encounter (HOSPITAL_COMMUNITY): Payer: Self-pay | Admitting: *Deleted

## 2012-09-17 ENCOUNTER — Ambulatory Visit (HOSPITAL_COMMUNITY)
Admission: RE | Admit: 2012-09-17 | Discharge: 2012-09-17 | Disposition: A | Discharge status: Home / Self Care | Payer: Medicare Other | Source: Ambulatory Visit | Attending: Internal Medicine | Admitting: Internal Medicine

## 2012-09-17 ENCOUNTER — Encounter (HOSPITAL_COMMUNITY)
Admission: RE | Disposition: A | Discharge status: Home / Self Care | Payer: Self-pay | Source: Ambulatory Visit | Attending: Internal Medicine

## 2012-09-17 DIAGNOSIS — I8510 Secondary esophageal varices without bleeding: Secondary | ICD-10-CM | POA: Insufficient documentation

## 2012-09-17 DIAGNOSIS — I10 Essential (primary) hypertension: Secondary | ICD-10-CM | POA: Insufficient documentation

## 2012-09-17 DIAGNOSIS — K7460 Unspecified cirrhosis of liver: Secondary | ICD-10-CM | POA: Insufficient documentation

## 2012-09-17 DIAGNOSIS — K766 Portal hypertension: Secondary | ICD-10-CM

## 2012-09-17 DIAGNOSIS — I8500 Esophageal varices without bleeding: Secondary | ICD-10-CM

## 2012-09-17 DIAGNOSIS — K319 Disease of stomach and duodenum, unspecified: Secondary | ICD-10-CM | POA: Insufficient documentation

## 2012-09-17 HISTORY — PX: ESOPHAGEAL BANDING: SHX5518

## 2012-09-17 HISTORY — PX: ESOPHAGOGASTRODUODENOSCOPY: SHX5428

## 2012-09-17 SURGERY — EGD (ESOPHAGOGASTRODUODENOSCOPY)
Anesthesia: Moderate Sedation

## 2012-09-17 MED ORDER — PROMETHAZINE HCL 25 MG/ML IJ SOLN
INTRAMUSCULAR | Status: AC
  Filled 2012-09-17: qty 1

## 2012-09-17 MED ORDER — MEPERIDINE HCL 50 MG/ML IJ SOLN
INTRAMUSCULAR | Status: AC
  Filled 2012-09-17: qty 1

## 2012-09-17 MED ORDER — BUTAMBEN-TETRACAINE-BENZOCAINE 2-2-14 % EX AERO
INHALATION_SPRAY | CUTANEOUS | Status: DC | PRN
  Administered 2012-09-17: 2 via TOPICAL

## 2012-09-17 MED ORDER — SODIUM CHLORIDE 0.9 % IV SOLN
INTRAVENOUS | Status: DC
Start: 2012-09-17 — End: 2012-09-17
  Administered 2012-09-17: 1000 mL via INTRAVENOUS

## 2012-09-17 MED ORDER — MEPERIDINE HCL 25 MG/ML IJ SOLN
INTRAMUSCULAR | Status: DC | PRN
  Administered 2012-09-17 (×2): 25 mg via INTRAVENOUS

## 2012-09-17 MED ORDER — MIDAZOLAM HCL 5 MG/5ML IJ SOLN
INTRAMUSCULAR | Status: AC
  Filled 2012-09-17: qty 10

## 2012-09-17 MED ORDER — STERILE WATER FOR IRRIGATION IR SOLN
Status: DC | PRN
  Administered 2012-09-17: 08:00:00

## 2012-09-17 MED ORDER — MIDAZOLAM HCL 5 MG/5ML IJ SOLN
INTRAMUSCULAR | Status: DC | PRN
  Administered 2012-09-17: 3 mg via INTRAVENOUS
  Administered 2012-09-17 (×2): 2 mg via INTRAVENOUS

## 2012-09-17 NOTE — Op Note (Signed)
EGD PROCEDURE REPORT  PATIENT:  Samantha [name]  MR#:  [id number] Birthdate:  1950/12/12, 62 y.o., female Endoscopist:  Dr. Rogene Houston, MD Referred By:  Dr. Sanjuana Letters, MD Procedure Date: 09/17/2012  Procedure:   EGD with esophageal variceal banding.  Indications:  Patient is a 62 year old Caucasian female with history of cirrhosis secondary to NAFLD was found to have large esophageal varices. She underwent initial planning session on 07/04/2012. She is returning for repeat session.            Informed Consent:  The risks, benefits, alternatives & imponderables which include, but are not limited to, bleeding, infection, perforation, drug reaction and potential missed lesion have been reviewed.  The potential for biopsy, lesion removal, esophageal dilation, etc. have also been discussed.  Questions have been answered.  All parties agreeable.  Please see history & physical in medical record for more information.  Medications:  Demerol 50 mg IV Versed 7 mg IV Cetacaine spray topically for oropharyngeal anesthesia  Description of procedure:  The endoscope was introduced through the mouth and advanced to the second portion of the duodenum without difficulty or limitations. The mucosal surfaces were surveyed very carefully during advancement of the scope and upon withdrawal.  Findings:  Esophagus:  Mucosa of the proximal and middle segment was normal. 2 columns of esophageal varices were located distally. Column on the right side was grade 2 and one on the left was short and grade 3. GEJ:  40 cm Stomach:  There was scant amount of food debris in the stomach which is distended very well with insufflation. Folds in the proximal stomach were normal. Mucosa and gastric body revealed mosaic pattern. Focal erythema in the prepyloric region as well. Pyloric channel was patent. Angularis was unremarkable. Fundus and cardia were also examined and no fundal varices identified. Duodenum:  Normal bulbar  and post bulbar mucosa.  Therapeutic/Diagnostic Maneuvers Performed:   Single column of esophageal varix was banded using a multiple band ligator device [employer]. Only one band was applied today.  Complications:  None  Impression: Single column of grade III esophageal varix was banded. Another smaller varix was left alone. Mild portal gastropathy. Scant amount of food debris in the stomach with patent pylorus.  Recommendations:  Full liquids for 24 hours. Office visit in 6 months.  REHMAN,NAJEEB U  09/17/2012  8:18 AM  CC: Dr. [name], MD & Dr. Rayne Du ref. provider found

## 2012-09-17 NOTE — H&P (Signed)
Samantha [name] is an 62 y.o. female.   Chief Complaint: Patient's here for EGD and esophageal variceal banding. HPI: Patient is 62 year old Caucasian female with history of cirrhosis secondary to NAFLD [place] esophageal variceal banding on 07/04/2012 for large varices. She's on returning for repeat session. She denies melena or rectal bleeding. She also denies heartburn or dysphagia. She states she is getting a treadmill 2-3 times a day and is about half mile each time.  Past Medical History  Diagnosis Date  . Rapid palpitations   . Coronary artery disease     Status post percutaneous coronary intervention and bare metal stenting  of the left circumflex and subsequent percutaneous transluminal coronary anioplasty and placement of a 3.0 NIR bare metal stent proximal to the previous stent.  . Hypertension   . Hyperlipidemia   . Obesity   . Gastroesophageal reflux disease   . Depression   . Osteoarthritis   . Status post hysterectomy   . History of pneumonia   . H/O: hysterectomy   . CHF (congestive heart failure)   . Esophageal varices     New 2013  . Cirrhosis of liver     Past Surgical History  Procedure Laterality Date  . Coronary artery bypass graft  May 31,2001    x 3 with a vein graft to the first diagonal, vein graft to the right coronary  artery, and a free left internal mammary  artery to the obtuse marginal   . Cardiac catheterization  2004    left internal mammary artery to the obtuse marginal  was found to be small and thread like.  The two grafts were patent.  The left circumflex had 90% in-stent restenosis and cutting balloon angioplasty was performed followed by placement of a 3.0 x 51m Taxus drug -eluting stent.    . Cardiac catheterization  2006    There was in-stent restenosis in the left circumflex and this was treated with cutting balloon angioplasty   . Cardiac catheterization  2008    vein graft to the to the obtuse marginal was patent, although small, left  circumflex had 40% in-stent restenosis, ejection fraction 40-45%.  The patient was medically mananged.  . Tonsillectomy    . Joint replacement    . Rotator cuff left  2007  . Back surgery    . Abdominal hysterectomy    . Colonoscopy  03/29/2011    Procedure: COLONOSCOPY;  Surgeon: MJamesetta So MD;  [facility]: [facility];  Service: Gastroenterology;  Laterality: N/A;  . Esophagogastroduodenoscopy  12/20/2011    Procedure: ESOPHAGOGASTRODUODENOSCOPY (EGD);  Surgeon: MJamesetta So MD;  [facility];  Service: Gastroenterology;  Laterality: N/A;  . Esophagogastroduodenoscopy N/A 07/04/2012    Procedure: ESOPHAGOGASTRODUODENOSCOPY (EGD);  Surgeon: NRogene Houston MD;  [facility];  Service: Endoscopy;  Laterality: N/A;  235-moved to 255 Ann to notify pt  . Esophageal banding N/A 07/04/2012    Procedure: ESOPHAGEAL BANDING;  Surgeon: NRogene Houston MD;  [facility];  Service: Endoscopy;  Laterality: N/A;    Family History  Problem Relation Age of Onset  . Heart attack Mother   . Heart attack Father 773   cause of death  . Coronary artery disease Sister     CABG   . Colon cancer Neg Hx    Social History:  reports that she quit smoking about 17 years ago. Her smoking use included Cigarettes. She has a 10 pack-year smoking history. She  has never used smokeless tobacco. She reports that she does not drink alcohol or use illicit drugs.  Allergies:  Allergies  Allergen Reactions  . Ace Inhibitors   . Pregabalin     Retains fluid  . Cefaclor Itching and Rash  . Cephalexin Itching and Rash  . Penicillins Rash    Medications Prior to Admission  Medication Sig Dispense Refill  . ALPRAZolam (XANAX) 0.5 MG tablet Take 0.5 mg by mouth 3 (three) times daily as needed. For anxiety. Rarely takes.      [name] atorvastatin (LIPITOR) 20 MG tablet Take 20 mg by mouth daily.        . cetirizine (ZYRTEC) 10 MG tablet Take 10 mg by mouth as needed for allergies.  Patient states that she only takes this during allergy season      . esomeprazole (NEXIUM) 40 MG capsule Take 40 mg by mouth daily before breakfast.       . ezetimibe (ZETIA) 10 MG tablet Take 10 mg by mouth daily.        [name] FLUoxetine (PROZAC) 20 MG tablet Take 20 mg by mouth daily.       . furosemide (LASIX) 40 MG tablet Take  1 1/2 tab daily      . HYDROcodone-acetaminophen (NORCO) 10-325 MG per tablet Take 1 tablet by mouth every 6 (six) hours as needed. For pain.      . isosorbide mononitrate (IMDUR) 30 MG CR tablet Take 30 mg by mouth daily.       . metoprolol succinate (TOPROL-XL) 25 MG 24 hr tablet Take 25 mg by mouth daily.        . valsartan (DIOVAN) 160 MG tablet Take 80-160 mg by mouth 2 (two) times daily. Take one tab in the morning and 1/2 tab at bed time.        No results found for this or any previous visit (from the past 48 hour(s)). No results found.  ROS  Blood pressure 159/75, pulse 70, temperature 98.2 F (36.8 C), temperature source Oral, resp. rate 20, last menstrual period 03/29/2011. Physical Exam  Constitutional: She appears well-developed and well-nourished.  HENT:  Mouth/Throat: Oropharynx is clear and moist.  Eyes: Conjunctivae are normal. No scleral icterus.  Neck: No thyromegaly present.  Cardiovascular: Normal rate and normal heart sounds.   No murmur heard. Respiratory: Effort normal and breath sounds normal.  GI: Soft. She exhibits no distension and no mass. There is no tenderness.  Musculoskeletal: Edema: 1+ pitting edema involving both legs.  Lymphadenopathy:    She has no cervical adenopathy.  Neurological: She is alert.  Skin: Skin is warm and dry.     Assessment/Plan Esophageal varices. Previous banding session was on 07/05/2022 Cirrhosis secondary to NAFLD. EGD with esophageal variceal banding.  [name] U 09/17/2012, 7:53 AM

## 2012-09-18 ENCOUNTER — Encounter (HOSPITAL_COMMUNITY): Payer: Self-pay | Admitting: Internal Medicine

## 2012-09-21 ENCOUNTER — Ambulatory Visit (INDEPENDENT_AMBULATORY_CARE_PROVIDER_SITE_OTHER): Payer: Medicare Other | Admitting: Cardiovascular Disease

## 2012-09-21 ENCOUNTER — Encounter: Payer: Self-pay | Admitting: Cardiovascular Disease

## 2012-09-21 VITALS — BP 132/74 | HR 68 | Ht 62.0 in | Wt 223.8 lb

## 2012-09-21 DIAGNOSIS — I2510 Atherosclerotic heart disease of native coronary artery without angina pectoris: Secondary | ICD-10-CM

## 2012-09-21 NOTE — Patient Instructions (Addendum)
Your physician wants you to follow-up in: 6 months  You will receive a reminder letter in the mail two months in advance. If you don't receive a letter, please call our office to schedule the follow-up appointment.  Your physician recommends that you continue on your current medications as directed. Please refer to the Current Medication list given to you today.  

## 2012-09-21 NOTE — Progress Notes (Signed)
HPI:  62 year old woman presenting for followup evaluation. She has been followed by Dr. Lia Foyer.she has a fairly complicated history. The patient has undergone CABG in 2001. She's subsequently had coronary stenting and Cutting Balloon angioplasty. She has a long Taxus drug-eluting stent it was used to treat in-stent restenosis in the left circumflex. She had been maintained on long-term dual antiplatelet therapy. 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 She has undergone banding recently. Plavix was discontinued at the time of her last office visit with Dr. Lia Foyer after a careful discussion of the risks involved.her last echocardiogram was done in 2012 in her left ventricular ejection fraction is 50% with inferolateral and posterior wall hypokinesis.her most recent heart catheterization from 2010 demonstrating continued patency of her vein graft and internal mammary to the diagonal and distal circumflex. The stent in the circumflex were also patent. The vein graft to the acute marginal branch was patent.  The patient underwent esophageal banding for treatment of her varices recently. She's been doing well. She is back to taking aspirin 81 mg daily. She denies chest pain, chest pressure, orthopnea, or PND. She has mild dyspnea with exertion, stable for several years. She reports no recent bleeding problems. Her anginal equivalent in the past has been shortness of breath and chest pressure.  Outpatient Encounter Prescriptions as of 09/21/2012  Medication Sig Dispense Refill  . ALPRAZolam (XANAX) 0.5 MG tablet Take 0.5 mg by mouth 3 (three) times daily as needed. For anxiety. Rarely takes.      [name] atorvastatin (LIPITOR) 20 MG tablet Take 20 mg by mouth daily.        [name] esomeprazole (NEXIUM) 40 MG capsule Take 40 mg by mouth daily before breakfast.       . ezetimibe (ZETIA) 10 MG tablet Take 10 mg by mouth daily.        [name] FLUoxetine (PROZAC) 20 MG tablet Take 20 mg by  mouth daily.       . furosemide (LASIX) 40 MG tablet Take  1 1/2 tab daily      . HYDROcodone-acetaminophen (NORCO) 10-325 MG per tablet Take 1 tablet by mouth every 6 (six) hours as needed. For pain.      . isosorbide mononitrate (IMDUR) 30 MG CR tablet Take 30 mg by mouth daily.       . metoprolol succinate (TOPROL-XL) 25 MG 24 hr tablet Take 25 mg by mouth daily.        . valsartan (DIOVAN) 160 MG tablet Take 80-160 mg by mouth 2 (two) times daily. Take one tab in the morning and 1/2 tab at bed time.      . [DISCONTINUED] cetirizine (ZYRTEC) 10 MG tablet Take 10 mg by mouth as needed for allergies. Patient states that she only takes this during allergy season       No facility-administered encounter medications on file as of 09/21/2012.    Allergies  Allergen Reactions  . Ace Inhibitors   . Pregabalin     Retains fluid  . Cefaclor Itching and Rash  . Cephalexin Itching and Rash  . Penicillins Rash    Past Medical History  Diagnosis Date  . Rapid palpitations   . Coronary artery disease     Status post percutaneous coronary intervention and bare metal stenting  of the left circumflex and subsequent percutaneous transluminal coronary anioplasty and placement of a 3.0 NIR bare metal stent proximal to the previous stent.  . Hypertension   . Hyperlipidemia   .  Obesity   . Gastroesophageal reflux disease   . Depression   . Osteoarthritis   . Status post hysterectomy   . History of pneumonia   . H/O: hysterectomy   . CHF (congestive heart failure)   . Esophageal varices     New 2013  . Cirrhosis of liver     ROS: Negative except as per HPI  BP 132/74  Pulse 68  Ht 5' 2"  (1.575 m)  Wt 101.515 kg (223 lb 12.8 oz)  BMI 40.92 kg/m2  SpO2 96%  LMP 03/29/2011  PHYSICAL EXAM: Pt is alert and oriented, pleasant overweight woman in NAD HEENT: normal Neck: JVP - normal, carotids 2+= without bruits Lungs: CTA bilaterally CV: RRR without murmur or gallop Abd: soft, NT,  Positive BS, no hepatomegaly Ext: no C/C/E, distal pulses intact and equal Skin: warm/dry no rash  Echo 07/2010: Study Conclusions  - Left ventricle: LVEF is approximately 50% with lateral, inferior and posterior hypokinesis. The cavity size was normal. Doppler parameters are consistent with abnormal left ventricular relaxation (grade 1 diastolic dysfunction). - Mitral valve: Mild regurgitation. - Left atrium: The atrium was moderately dilated.  ASSESSMENT AND PLAN: 1. Coronary artery disease, native vessel. Cardiac catheterization reports reviewed. She has a fairly complex disease with history of CABG and multiple PCI procedures. She seems to be doing well on aspirin alone and risk/benefit of bleeding in the setting of cirrhosis and esophageal varices is in favor of less intense antiplatelet therapy. She will stay off of Plavix at this time. I will see her back in 6 months unless problems arise.  2. Hypertension. Blood pressure is well controlled on a combination of furosemide, isosorbide, metoprolol succinate, and valsartan.  3. Hyperlipidemia. Lipids are followed by her primary care physician. She's on ezetemibe and atorvastatin.  Sherren Mocha 09/22/2012 3:36 PM

## 2012-10-15 ENCOUNTER — Encounter (INDEPENDENT_AMBULATORY_CARE_PROVIDER_SITE_OTHER): Payer: Self-pay

## 2013-01-03 ENCOUNTER — Other Ambulatory Visit: Payer: Self-pay

## 2013-01-10 ENCOUNTER — Encounter (INDEPENDENT_AMBULATORY_CARE_PROVIDER_SITE_OTHER): Payer: Self-pay | Admitting: Internal Medicine

## 2013-01-10 ENCOUNTER — Ambulatory Visit (INDEPENDENT_AMBULATORY_CARE_PROVIDER_SITE_OTHER): Payer: Medicare Other | Admitting: Internal Medicine

## 2013-01-10 VITALS — BP 142/52 | HR 72 | Temp 98.0°F | Ht 62.0 in | Wt 220.1 lb

## 2013-01-10 DIAGNOSIS — G609 Hereditary and idiopathic neuropathy, unspecified: Secondary | ICD-10-CM | POA: Insufficient documentation

## 2013-01-10 NOTE — Patient Instructions (Signed)
F/u with PCP concerning neuropathy

## 2013-01-10 NOTE — Progress Notes (Signed)
Subjective:     Patient ID: Samantha [name], female   DOB: December 27, 1950, 62 y.o.   MRN: [id number]  HPI Here today for f/u. She tells me she has something going on with her legs. She says she was intolerant of Lyrica.  She tells me her legs are tingling.  She was taking Neurontin but says Dr. Laural Golden took her off because of her NAFLD. She has been told she has a neuropathy in her legs.   09/17/2012 EGD with Banding:  Impression:  Single column of grade III esophageal varix was banded.  Another smaller varix was left alone.  Mild portal gastropathy.  Scant amount of food debris in the stomach with patent pylorus.    Subjective:     Review of Systems  See hpi Current Outpatient Prescriptions  Medication Sig Dispense Refill  . ALPRAZolam (XANAX) 0.5 MG tablet Take 0.5 mg by mouth 3 (three) times daily as needed. For anxiety. Rarely takes.      [name] atorvastatin (LIPITOR) 20 MG tablet Take 20 mg by mouth daily.        [name] esomeprazole (NEXIUM) 40 MG capsule Take 40 mg by mouth daily before breakfast.       . ezetimibe (ZETIA) 10 MG tablet Take 10 mg by mouth daily.        [name] FLUoxetine (PROZAC) 20 MG tablet Take 20 mg by mouth daily.       . furosemide (LASIX) 40 MG tablet Take  1 1/2 tab daily      . HYDROcodone-acetaminophen (NORCO) 10-325 MG per tablet Take 1 tablet by mouth every 6 (six) hours as needed. For pain.      . isosorbide mononitrate (IMDUR) 30 MG CR tablet Take 30 mg by mouth daily.       . metoprolol succinate (TOPROL-XL) 25 MG 24 hr tablet Take 25 mg by mouth daily.        . valsartan (DIOVAN) 160 MG tablet Take 80-160 mg by mouth 2 (two) times daily. Take one tab in the morning and 1/2 tab at bed time.       No current facility-administered medications for this visit.   Past Medical History  Diagnosis Date  . Rapid palpitations   . Coronary artery disease     Status post percutaneous coronary intervention and bare metal stenting  of the left circumflex and subsequent  percutaneous transluminal coronary anioplasty and placement of a 3.0 NIR bare metal stent proximal to the previous stent.  . Hypertension   . Hyperlipidemia   . Obesity   . Gastroesophageal reflux disease   . Depression   . Osteoarthritis   . Status post hysterectomy   . History of pneumonia   . H/O: hysterectomy   . CHF (congestive heart failure)   . Esophageal varices     New 2013  . Cirrhosis of liver    Past Surgical History  Procedure Laterality Date  . Coronary artery bypass graft  May 31,2001    x 3 with a vein graft to the first diagonal, vein graft to the right coronary  artery, and a free left internal mammary  artery to the obtuse marginal   . Cardiac catheterization  2004    left internal mammary artery to the obtuse marginal  was found to be small and thread like.  The two grafts were patent.  The left circumflex had 90% in-stent restenosis and cutting balloon angioplasty was performed followed by placement of a 3.0 x 21mm Taxus  drug -eluting stent.    . Cardiac catheterization  2006    There was in-stent restenosis in the left circumflex and this was treated with cutting balloon angioplasty   . Cardiac catheterization  2008    vein graft to the to the obtuse marginal was patent, although small, left circumflex had 40% in-stent restenosis, ejection fraction 40-45%.  The patient was medically mananged.  . Tonsillectomy    . Joint replacement    . Rotator cuff left  2007  . Back surgery    . Abdominal hysterectomy    . Colonoscopy  03/29/2011    Procedure: COLONOSCOPY;  Surgeon: Jamesetta So, MD;  Location: [facility];  Service: Gastroenterology;  Laterality: N/A;  . Esophagogastroduodenoscopy  12/20/2011    Procedure: ESOPHAGOGASTRODUODENOSCOPY (EGD);  Surgeon: Jamesetta So, MD;  Location: [facility];  Service: Gastroenterology;  Laterality: N/A;  . Esophagogastroduodenoscopy N/A 07/04/2012    Procedure: ESOPHAGOGASTRODUODENOSCOPY (EGD);  Surgeon: Rogene Houston, MD;  Location: [facility];  Service: Endoscopy;  Laterality: N/A;  235-moved to 255 Ann to notify pt  . Esophageal banding N/A 07/04/2012    Procedure: ESOPHAGEAL BANDING;  Surgeon: Rogene Houston, MD;  Location: [facility];  Service: Endoscopy;  Laterality: N/A;  . Esophagogastroduodenoscopy N/A 09/17/2012    Procedure: ESOPHAGOGASTRODUODENOSCOPY (EGD);  Surgeon: Rogene Houston, MD;  [facility]: [facility];  Service: Endoscopy;  Laterality: N/A;  730  . Esophageal banding N/A 09/17/2012    Procedure: ESOPHAGEAL BANDING;  Surgeon: Rogene Houston, MD;  Location: [facility];  Service: Endoscopy;  Laterality: N/A;   Allergies  Allergen Reactions  . Ace Inhibitors   . Pregabalin     Retains fluid  . Cefaclor Itching and Rash  . Cephalexin Itching and Rash  . Penicillins Rash         Objective:   Physical Exam   Filed Vitals:   01/10/13 1504  BP: 142/52  Pulse: 72  Temp: 98 F (36.7 C)  Height: 5\' 2"  (1.575 m)  Weight: 220 lb 1.6 oz (99.837 kg)   Alert and oriented. Skin warm and dry. Oral mucosa is moist.   . Sclera anicteric, conjunctivae is pink. Thyroid not enlarged. No cervical lymphadenopathy. Lungs clear. Heart regular rate and rhythm.  Abdomen is soft. Bowel sounds are positive. No hepatomegaly. No abdominal masses felt. No tenderness.  No edema to lower extremities.       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 I cannot put her back on the Neurontin because of her NAFLD.    Plan:    I advised her to follow up with her PCP concerning her neuropathy

## 2013-03-05 ENCOUNTER — Encounter (INDEPENDENT_AMBULATORY_CARE_PROVIDER_SITE_OTHER): Payer: Self-pay | Admitting: *Deleted

## 2013-03-05 DIAGNOSIS — Z7982 Long term (current) use of aspirin: Secondary | ICD-10-CM | POA: Diagnosis not present

## 2013-03-05 DIAGNOSIS — R42 Dizziness and giddiness: Secondary | ICD-10-CM | POA: Diagnosis not present

## 2013-03-05 DIAGNOSIS — Z9861 Coronary angioplasty status: Secondary | ICD-10-CM | POA: Diagnosis not present

## 2013-03-05 DIAGNOSIS — Z8249 Family history of ischemic heart disease and other diseases of the circulatory system: Secondary | ICD-10-CM | POA: Diagnosis not present

## 2013-03-05 DIAGNOSIS — R079 Chest pain, unspecified: Secondary | ICD-10-CM | POA: Diagnosis not present

## 2013-03-05 DIAGNOSIS — I9589 Other hypotension: Secondary | ICD-10-CM | POA: Diagnosis not present

## 2013-03-05 DIAGNOSIS — I2510 Atherosclerotic heart disease of native coronary artery without angina pectoris: Secondary | ICD-10-CM | POA: Diagnosis not present

## 2013-03-05 DIAGNOSIS — Z880 Allergy status to penicillin: Secondary | ICD-10-CM | POA: Diagnosis not present

## 2013-03-05 DIAGNOSIS — I209 Angina pectoris, unspecified: Secondary | ICD-10-CM | POA: Diagnosis not present

## 2013-03-05 DIAGNOSIS — K219 Gastro-esophageal reflux disease without esophagitis: Secondary | ICD-10-CM | POA: Diagnosis not present

## 2013-03-05 DIAGNOSIS — Z79899 Other long term (current) drug therapy: Secondary | ICD-10-CM | POA: Diagnosis not present

## 2013-03-05 DIAGNOSIS — I252 Old myocardial infarction: Secondary | ICD-10-CM | POA: Diagnosis not present

## 2013-03-05 DIAGNOSIS — E785 Hyperlipidemia, unspecified: Secondary | ICD-10-CM | POA: Diagnosis not present

## 2013-03-05 DIAGNOSIS — K7460 Unspecified cirrhosis of liver: Secondary | ICD-10-CM | POA: Diagnosis not present

## 2013-03-05 DIAGNOSIS — Z883 Allergy status to other anti-infective agents status: Secondary | ICD-10-CM | POA: Diagnosis not present

## 2013-03-05 DIAGNOSIS — M79609 Pain in unspecified limb: Secondary | ICD-10-CM | POA: Diagnosis not present

## 2013-03-05 DIAGNOSIS — I8510 Secondary esophageal varices without bleeding: Secondary | ICD-10-CM | POA: Diagnosis not present

## 2013-03-05 DIAGNOSIS — I10 Essential (primary) hypertension: Secondary | ICD-10-CM | POA: Diagnosis not present

## 2013-03-05 DIAGNOSIS — H8309 Labyrinthitis, unspecified ear: Secondary | ICD-10-CM | POA: Diagnosis not present

## 2013-03-06 DIAGNOSIS — R42 Dizziness and giddiness: Secondary | ICD-10-CM | POA: Diagnosis not present

## 2013-03-11 ENCOUNTER — Other Ambulatory Visit (INDEPENDENT_AMBULATORY_CARE_PROVIDER_SITE_OTHER): Payer: Self-pay | Admitting: Internal Medicine

## 2013-03-11 DIAGNOSIS — K7460 Unspecified cirrhosis of liver: Secondary | ICD-10-CM

## 2013-03-14 ENCOUNTER — Ambulatory Visit (HOSPITAL_COMMUNITY)
Admission: RE | Admit: 2013-03-14 | Discharge: 2013-03-14 | Disposition: A | Discharge status: Home / Self Care | Payer: Medicare Other | Source: Ambulatory Visit | Attending: Internal Medicine | Admitting: Internal Medicine

## 2013-03-14 DIAGNOSIS — K7460 Unspecified cirrhosis of liver: Secondary | ICD-10-CM | POA: Diagnosis not present

## 2013-03-14 DIAGNOSIS — R161 Splenomegaly, not elsewhere classified: Secondary | ICD-10-CM | POA: Diagnosis not present

## 2013-03-18 ENCOUNTER — Ambulatory Visit (INDEPENDENT_AMBULATORY_CARE_PROVIDER_SITE_OTHER): Payer: Medicare Other | Admitting: Internal Medicine

## 2013-03-20 ENCOUNTER — Encounter: Payer: Self-pay | Admitting: Cardiovascular Disease

## 2013-03-20 ENCOUNTER — Ambulatory Visit (INDEPENDENT_AMBULATORY_CARE_PROVIDER_SITE_OTHER): Payer: Medicare Other | Admitting: Internal Medicine

## 2013-03-20 ENCOUNTER — Ambulatory Visit (INDEPENDENT_AMBULATORY_CARE_PROVIDER_SITE_OTHER): Payer: Medicare Other | Admitting: Cardiovascular Disease

## 2013-03-20 ENCOUNTER — Encounter (INDEPENDENT_AMBULATORY_CARE_PROVIDER_SITE_OTHER): Payer: Self-pay | Admitting: Internal Medicine

## 2013-03-20 VITALS — BP 112/50 | HR 64 | Temp 98.0°F | Ht 62.0 in | Wt 219.2 lb

## 2013-03-20 VITALS — BP 140/70 | HR 75 | Ht 62.0 in | Wt 220.4 lb

## 2013-03-20 DIAGNOSIS — K7689 Other specified diseases of liver: Secondary | ICD-10-CM | POA: Diagnosis not present

## 2013-03-20 DIAGNOSIS — I2510 Atherosclerotic heart disease of native coronary artery without angina pectoris: Secondary | ICD-10-CM | POA: Diagnosis not present

## 2013-03-20 DIAGNOSIS — K760 Fatty (change of) liver, not elsewhere classified: Secondary | ICD-10-CM

## 2013-03-20 NOTE — Progress Notes (Signed)
Subjective:     Patient ID: Samantha [name], female   DOB: February 25, 1951, 63 y.o.   MRN: [id number]  HPIHere today for f/u of her NAFLD. In 2013 she underwent an EGD by Dr. Arnoldo Morale and noted to have 2 colums of esophageal varices. She was found to have a fatty liver approximately 10 yrs ago by Dr. [name].  She also has a hx of thrombocytopenia and was seen by Dr. Becky Sax but not now.  She tells me she is doing good. She is walking a lot. Her appetite is good. No weight loss. No edema to to lower extremities. No melena or bright red rectal bleeding.  No hematemesis.   EGD/Banding: 09/17/2012:   Impression:  Single column of grade III esophageal varix was banded.  Another smaller varix was left alone.  Mild portal gastropathy.  Scant amount of food debris in the stomach with patent pylorus.  03/14/2013 US abdomen: None.  IMPRESSION:  1. Heterogeneous hepatic echotexture with a micronodular contour or  as can be seen with cirrhosis. No focal hepatic mass.  2. Mild splenomegaly.  3. Bilateral mild renal cortical thinning.  Electronically Signed  By: Kathreen Devoid  On: 03/14/2013 09:30   Hepatitis B and C markers were negative.    Review of Systems Current Outpatient Prescriptions  Medication Sig Dispense Refill  . ALPRAZolam (XANAX) 0.5 MG tablet Take 0.5 mg by mouth 3 (three) times daily as needed. For anxiety. Rarely takes.      [name] amoxicillin (AMOXIL) 500 MG capsule Take 500 mg by mouth 3 (three) times daily.      [name] atorvastatin (LIPITOR) 20 MG tablet Take 20 mg by mouth daily.        [name] esomeprazole (NEXIUM) 40 MG capsule Take 40 mg by mouth daily before breakfast.       . ezetimibe (ZETIA) 10 MG tablet Take 10 mg by mouth daily.        [name] FLUoxetine (PROZAC) 20 MG tablet Take 20 mg by mouth daily.       . furosemide (LASIX) 40 MG tablet Take  1 1/2 tab daily      . HYDROcodone-acetaminophen (NORCO) 10-325 MG per tablet Take 1 tablet by mouth every 6 (six) hours as needed. For  pain.      [name] HYDROcodone-homatropine (HYCODAN) 5-1.5 MG/5ML syrup Take 5 mLs by mouth every 6 (six) hours as needed for cough.      . isosorbide mononitrate (IMDUR) 30 MG CR tablet Take 30 mg by mouth daily.       . meclizine (ANTIVERT) 25 MG tablet       . metoprolol succinate (TOPROL-XL) 25 MG 24 hr tablet Take 25 mg by mouth daily.        . valsartan (DIOVAN) 160 MG tablet Take 80-160 mg by mouth 2 (two) times daily. Take one tab in the morning and 1/2 tab at bed time.       No current facility-administered medications for this visit.   Past Surgical History  Procedure Laterality Date  . Coronary artery bypass graft  May 31,2001    x 3 with a vein graft to the first diagonal, vein graft to the right coronary  artery, and a free left internal mammary  artery to the obtuse marginal   . Cardiac catheterization  2004    left internal mammary artery to the obtuse marginal  was found to be small and thread like.  The two grafts were patent.  The  left circumflex had 90% in-stent restenosis and cutting balloon angioplasty was performed followed by placement of a 3.0 x 41mm Taxus drug -eluting stent.    . Cardiac catheterization  2006    There was in-stent restenosis in the left circumflex and this was treated with cutting balloon angioplasty   . Cardiac catheterization  2008    vein graft to the to the obtuse marginal was patent, although small, left circumflex had 40% in-stent restenosis, ejection fraction 40-45%.  The patient was medically mananged.  . Tonsillectomy    . Joint replacement    . Rotator cuff left  2007  . Back surgery    . Abdominal hysterectomy    . Colonoscopy  03/29/2011    Procedure: COLONOSCOPY;  Surgeon: Jamesetta So, MD;  Location: [facility];  Service: Gastroenterology;  Laterality: N/A;  . Esophagogastroduodenoscopy  12/20/2011    Procedure: ESOPHAGOGASTRODUODENOSCOPY (EGD);  Surgeon: Jamesetta So, MD;  Location: [facility];  Service: Gastroenterology;   Laterality: N/A;  . Esophagogastroduodenoscopy N/A 07/04/2012    Procedure: ESOPHAGOGASTRODUODENOSCOPY (EGD);  Surgeon: Rogene Houston, MD;  [facility]: [facility];  Service: Endoscopy;  Laterality: N/A;  235-moved to 255 Ann to notify pt  . Esophageal banding N/A 07/04/2012    Procedure: ESOPHAGEAL BANDING;  Surgeon: Rogene Houston, MD;  Location: [facility];  Service: Endoscopy;  Laterality: N/A;  . Esophagogastroduodenoscopy N/A 09/17/2012    Procedure: ESOPHAGOGASTRODUODENOSCOPY (EGD);  Surgeon: Rogene Houston, MD;  [facility]: [facility];  Service: Endoscopy;  Laterality: N/A;  730  . Esophageal banding N/A 09/17/2012    Procedure: ESOPHAGEAL BANDING;  Surgeon: Rogene Houston, MD;  Location: [facility];  Service: Endoscopy;  Laterality: N/A;   Allergies  Allergen Reactions  . Ace Inhibitors   . Pregabalin     Retains fluid  . Cefaclor Itching and Rash  . Cephalexin Itching and Rash  . Penicillins Rash       Objective:   Physical Exam  Filed Vitals:   03/20/13 1506  BP: 112/50  Pulse: 64  Temp: 98 F (36.7 C)  Height: 5\' 2"  (1.575 m)  Weight: 219 lb 3.2 oz (99.428 kg)   Alert and oriented. Skin warm and dry. Oral mucosa is moist.   . Sclera anicteric, conjunctivae is pink. Thyroid not enlarged. No cervical lymphadenopathy. Lungs clear. Heart regular rate and rhythm.  Abdomen is soft. Bowel sounds are positive. No hepatomegaly. No abdominal masses felt. No tenderness.  No edema to lower extremities.       Assessment:  NAFLD. She seems to be doing good. Hx of esophageal varices.   Thrombocytopenia hx of Plan:   US abdomen in 6 months with an hepatic function CBC today. And hepatic function

## 2013-03-20 NOTE — Patient Instructions (Addendum)
OV in 6 months with a hepatic function and an [place]

## 2013-03-20 NOTE — Patient Instructions (Signed)
Your physician recommends that you continue on your current medications as directed. Please refer to the Current Medication list given to you today.  Your physician wants you to follow-up in: 6 months with Dr. Burt Knack.  You will receive a reminder letter in the mail two months in advance. If you don't receive a letter, please call our office to schedule the follow-up appointment.

## 2013-03-20 NOTE — Progress Notes (Signed)
HPI:  63 year-old woman presenting for follow-up evaluation. The patient has undergone CABG in 2001. She subsequently had coronary stenting and Cutting Balloon angioplasty. She has a long Taxus drug-eluting stent it was used to treat in-stent restenosis in the left circumflex. She had been maintained on long-term dual antiplatelet therapy. 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 She has undergone banding. Plavix was discontinued at the time of her last year  Her last echocardiogram was done in 2012 and her left ventricular ejection fraction is 50% with inferolateral and posterior wall hypokinesis.her most recent heart catheterization from 2010 demonstrating continued patency of her vein graft and internal mammary to the diagonal and distal circumflex. The stent in the circumflex were also patent. The vein graft to the acute marginal branch was patent.  The patient has been going through a lot of personal stress. Her brother has moved in with her and this is cause some problems. She had an episode of lightheadedness last week and went to the hospital for evaluation. Her blood pressure was noted to be lower than normal, measured at 102/57. She was observed overnight and discharged home the following morning. She has periodic lightheadedness, but measures home blood pressures and they have been in a good range. She's had one episode of angina requiring sublingual nitroglycerin. No other chest pain reported. No dyspnea, orthopnea, or PND. She has mild leg swelling and takes Lasix on a daily basis.  Outpatient Encounter Prescriptions as of 03/20/2013  Medication Sig  . ALPRAZolam (XANAX) 0.5 MG tablet Take 0.5 mg by mouth 3 (three) times daily as needed. For anxiety. Rarely takes.  [name] atorvastatin (LIPITOR) 20 MG tablet Take 20 mg by mouth daily.    [name] esomeprazole (NEXIUM) 40 MG capsule Take 40 mg by mouth daily before breakfast.   . ezetimibe (ZETIA) 10 MG tablet  Take 10 mg by mouth daily.    [name] FLUoxetine (PROZAC) 20 MG tablet Take 20 mg by mouth daily.   . furosemide (LASIX) 40 MG tablet Take  1 1/2 tab daily  . HYDROcodone-acetaminophen (NORCO) 10-325 MG per tablet Take 1 tablet by mouth every 6 (six) hours as needed. For pain.  . isosorbide mononitrate (IMDUR) 30 MG CR tablet Take 30 mg by mouth daily.   . meclizine (ANTIVERT) 25 MG tablet   . metoprolol succinate (TOPROL-XL) 25 MG 24 hr tablet Take 25 mg by mouth daily.    . valsartan (DIOVAN) 160 MG tablet Take 80-160 mg by mouth 2 (two) times daily. Take one tab in the morning and 1/2 tab at bed time.    Allergies  Allergen Reactions  . Ace Inhibitors   . Pregabalin     Retains fluid  . Cefaclor Itching and Rash  . Cephalexin Itching and Rash  . Penicillins Rash    Past Medical History  Diagnosis Date  . Rapid palpitations   . Coronary artery disease     Status post percutaneous coronary intervention and bare metal stenting  of the left circumflex and subsequent percutaneous transluminal coronary anioplasty and placement of a 3.0 NIR bare metal stent proximal to the previous stent.  . Hypertension   . Hyperlipidemia   . Obesity   . Gastroesophageal reflux disease   . Depression   . Osteoarthritis   . Status post hysterectomy   . History of pneumonia   . H/O: hysterectomy   . CHF (congestive heart failure)   . Esophageal varices  New 2013  . Cirrhosis of liver     ROS: Negative except as per HPI  BP 140/70  Pulse 75  Ht 5' 2"  (1.575 m)  Wt 220 lb 6.4 oz (99.973 kg)  BMI 40.30 kg/m2  LMP 03/29/2011  PHYSICAL EXAM: Pt is alert and oriented, NAD HEENT: normal Neck: JVP - normal, carotids 2+= without bruits Lungs: CTA bilaterally CV: RRR without murmur or gallop Abd: soft, NT, Positive BS, no hepatomegaly Ext: 1+ pretibial edema bilaterally, distal pulses intact and equal Skin: warm/dry no rash  EKG:  Normal sinus rhythm 75 beats per minute, nonspecific  intraventricular conduction block, nonspecific T wave abnormality.  ASSESSMENT AND PLAN: 1. Coronary atherosclerosis, native vessel. The patient is stable with rare angina. She is working on reducing her stress level at home. Her medication regimen will be continued without change. I will see her back in 6 months for followup.  2. Essential hypertension. Blood pressure is well controlled. She has had periodic lightheadedness. This seems improved with meclizine. I'm not sure that her symptoms are blood pressure related. Her readings at home are in good range and I'm not inclined to change her medical program at this time.  3. Hyperlipidemia. The patient is treated with a combination of atorvastatin and zetia. Her primary care physician follows her labs.  Sherren Mocha 03/20/2013 9:55 AM

## 2013-03-21 LAB — HEPATIC FUNCTION PANEL
ALT: 24 U/L (ref 0–35)
AST: 47 U/L — ABNORMAL HIGH (ref 0–37)
Albumin: 4.4 g/dL (ref 3.5–5.2)
Alkaline Phosphatase: 102 U/L (ref 39–117)
Bilirubin, Direct: 0.1 mg/dL (ref 0.0–0.3)
Indirect Bilirubin: 0.5 mg/dL (ref 0.0–0.9)
Total Bilirubin: 0.6 mg/dL (ref 0.3–1.2)
Total Protein: 7.8 g/dL (ref 6.0–8.3)

## 2013-03-21 LAB — CBC WITH DIFFERENTIAL/PLATELET
Basophils Absolute: 0 10*3/uL (ref 0.0–0.1)
Basophils Relative: 1 % (ref 0–1)
Eosinophils Absolute: 0.2 10*3/uL (ref 0.0–0.7)
Eosinophils Relative: 3 % (ref 0–5)
HCT: 37.1 % (ref 36.0–46.0)
Hemoglobin: 11.9 g/dL — ABNORMAL LOW (ref 12.0–15.0)
Lymphocytes Relative: 33 % (ref 12–46)
Lymphs Abs: 1.8 10*3/uL (ref 0.7–4.0)
MCH: 23.8 pg — ABNORMAL LOW (ref 26.0–34.0)
MCHC: 32.1 g/dL (ref 30.0–36.0)
MCV: 74.3 fL — ABNORMAL LOW (ref 78.0–100.0)
Monocytes Absolute: 0.6 10*3/uL (ref 0.1–1.0)
Monocytes Relative: 11 % (ref 3–12)
Neutro Abs: 2.8 10*3/uL (ref 1.7–7.7)
Neutrophils Relative %: 52 % (ref 43–77)
Platelets: 126 10*3/uL — ABNORMAL LOW (ref 150–400)
RBC: 4.99 MIL/uL (ref 3.87–5.11)
RDW: 16.7 % — ABNORMAL HIGH (ref 11.5–15.5)
WBC: 5.3 10*3/uL (ref 4.0–10.5)

## 2013-03-28 ENCOUNTER — Telehealth (INDEPENDENT_AMBULATORY_CARE_PROVIDER_SITE_OTHER): Payer: Self-pay | Admitting: *Deleted

## 2013-03-28 DIAGNOSIS — K760 Fatty (change of) liver, not elsewhere classified: Secondary | ICD-10-CM

## 2013-03-28 NOTE — Telephone Encounter (Signed)
.  Per Lelon Perla the patient will need to have lab work in 6 months . Lab is noted for 09/17/13.

## 2013-05-20 DIAGNOSIS — J180 Bronchopneumonia, unspecified organism: Secondary | ICD-10-CM | POA: Diagnosis not present

## 2013-05-20 DIAGNOSIS — R042 Hemoptysis: Secondary | ICD-10-CM | POA: Diagnosis not present

## 2013-06-05 DIAGNOSIS — J45901 Unspecified asthma with (acute) exacerbation: Secondary | ICD-10-CM | POA: Diagnosis not present

## 2013-06-05 DIAGNOSIS — E119 Type 2 diabetes mellitus without complications: Secondary | ICD-10-CM | POA: Diagnosis not present

## 2013-06-05 DIAGNOSIS — J438 Other emphysema: Secondary | ICD-10-CM | POA: Diagnosis not present

## 2013-06-13 DIAGNOSIS — I10 Essential (primary) hypertension: Secondary | ICD-10-CM | POA: Diagnosis not present

## 2013-06-13 DIAGNOSIS — E119 Type 2 diabetes mellitus without complications: Secondary | ICD-10-CM | POA: Diagnosis not present

## 2013-06-13 DIAGNOSIS — E7800 Pure hypercholesterolemia, unspecified: Secondary | ICD-10-CM | POA: Diagnosis not present

## 2013-06-18 DIAGNOSIS — I10 Essential (primary) hypertension: Secondary | ICD-10-CM | POA: Diagnosis not present

## 2013-06-18 DIAGNOSIS — K7460 Unspecified cirrhosis of liver: Secondary | ICD-10-CM | POA: Diagnosis not present

## 2013-06-18 DIAGNOSIS — F411 Generalized anxiety disorder: Secondary | ICD-10-CM | POA: Diagnosis not present

## 2013-06-18 DIAGNOSIS — E7800 Pure hypercholesterolemia, unspecified: Secondary | ICD-10-CM | POA: Diagnosis not present

## 2013-06-18 DIAGNOSIS — I209 Angina pectoris, unspecified: Secondary | ICD-10-CM | POA: Diagnosis not present

## 2013-06-18 DIAGNOSIS — E119 Type 2 diabetes mellitus without complications: Secondary | ICD-10-CM | POA: Diagnosis not present

## 2013-06-18 DIAGNOSIS — I8500 Esophageal varices without bleeding: Secondary | ICD-10-CM | POA: Diagnosis not present

## 2013-06-18 DIAGNOSIS — G619 Inflammatory polyneuropathy, unspecified: Secondary | ICD-10-CM | POA: Diagnosis not present

## 2013-06-18 DIAGNOSIS — G622 Polyneuropathy due to other toxic agents: Secondary | ICD-10-CM | POA: Diagnosis not present

## 2013-06-19 ENCOUNTER — Emergency Department (HOSPITAL_COMMUNITY): Payer: Medicare Other

## 2013-06-19 ENCOUNTER — Emergency Department (HOSPITAL_COMMUNITY)
Admission: EM | Admit: 2013-06-19 | Discharge: 2013-06-20 | Disposition: A | Discharge status: Home / Self Care | Payer: Medicare Other | Attending: Emergency Medicine | Admitting: Emergency Medicine

## 2013-06-19 ENCOUNTER — Encounter (HOSPITAL_COMMUNITY): Payer: Self-pay | Admitting: Emergency Medicine

## 2013-06-19 DIAGNOSIS — S8990XA Unspecified injury of unspecified lower leg, initial encounter: Secondary | ICD-10-CM | POA: Diagnosis not present

## 2013-06-19 DIAGNOSIS — K219 Gastro-esophageal reflux disease without esophagitis: Secondary | ICD-10-CM | POA: Insufficient documentation

## 2013-06-19 DIAGNOSIS — Z8701 Personal history of pneumonia (recurrent): Secondary | ICD-10-CM | POA: Insufficient documentation

## 2013-06-19 DIAGNOSIS — E669 Obesity, unspecified: Secondary | ICD-10-CM | POA: Insufficient documentation

## 2013-06-19 DIAGNOSIS — E785 Hyperlipidemia, unspecified: Secondary | ICD-10-CM | POA: Insufficient documentation

## 2013-06-19 DIAGNOSIS — Z951 Presence of aortocoronary bypass graft: Secondary | ICD-10-CM | POA: Insufficient documentation

## 2013-06-19 DIAGNOSIS — M1990 Unspecified osteoarthritis, unspecified site: Secondary | ICD-10-CM | POA: Insufficient documentation

## 2013-06-19 DIAGNOSIS — S82853A Displaced trimalleolar fracture of unspecified lower leg, initial encounter for closed fracture: Secondary | ICD-10-CM | POA: Diagnosis not present

## 2013-06-19 DIAGNOSIS — Z9889 Other specified postprocedural states: Secondary | ICD-10-CM | POA: Insufficient documentation

## 2013-06-19 DIAGNOSIS — Z87891 Personal history of nicotine dependence: Secondary | ICD-10-CM | POA: Diagnosis not present

## 2013-06-19 DIAGNOSIS — S82899A Other fracture of unspecified lower leg, initial encounter for closed fracture: Secondary | ICD-10-CM | POA: Diagnosis not present

## 2013-06-19 DIAGNOSIS — I2510 Atherosclerotic heart disease of native coronary artery without angina pectoris: Secondary | ICD-10-CM | POA: Insufficient documentation

## 2013-06-19 DIAGNOSIS — I509 Heart failure, unspecified: Secondary | ICD-10-CM | POA: Insufficient documentation

## 2013-06-19 DIAGNOSIS — T148XXA Other injury of unspecified body region, initial encounter: Secondary | ICD-10-CM | POA: Diagnosis not present

## 2013-06-19 DIAGNOSIS — Z880 Allergy status to penicillin: Secondary | ICD-10-CM | POA: Diagnosis not present

## 2013-06-19 DIAGNOSIS — F3289 Other specified depressive episodes: Secondary | ICD-10-CM | POA: Diagnosis not present

## 2013-06-19 DIAGNOSIS — F329 Major depressive disorder, single episode, unspecified: Secondary | ICD-10-CM | POA: Insufficient documentation

## 2013-06-19 DIAGNOSIS — S82851A Displaced trimalleolar fracture of right lower leg, initial encounter for closed fracture: Secondary | ICD-10-CM

## 2013-06-19 DIAGNOSIS — Y9289 Other specified places as the place of occurrence of the external cause: Secondary | ICD-10-CM | POA: Insufficient documentation

## 2013-06-19 DIAGNOSIS — I10 Essential (primary) hypertension: Secondary | ICD-10-CM | POA: Insufficient documentation

## 2013-06-19 DIAGNOSIS — S99919A Unspecified injury of unspecified ankle, initial encounter: Secondary | ICD-10-CM | POA: Diagnosis not present

## 2013-06-19 DIAGNOSIS — R6889 Other general symptoms and signs: Secondary | ICD-10-CM | POA: Diagnosis not present

## 2013-06-19 DIAGNOSIS — W010XXA Fall on same level from slipping, tripping and stumbling without subsequent striking against object, initial encounter: Secondary | ICD-10-CM | POA: Insufficient documentation

## 2013-06-19 DIAGNOSIS — Z79899 Other long term (current) drug therapy: Secondary | ICD-10-CM | POA: Insufficient documentation

## 2013-06-19 DIAGNOSIS — Y9301 Activity, walking, marching and hiking: Secondary | ICD-10-CM | POA: Insufficient documentation

## 2013-06-19 MED ORDER — ETOMIDATE 2 MG/ML IV SOLN
INTRAVENOUS | Status: AC
  Filled 2013-06-19: qty 10

## 2013-06-19 MED ORDER — ETOMIDATE 2 MG/ML IV SOLN
0.2000 mg/kg | Freq: Once | INTRAVENOUS | Status: AC
  Administered 2013-06-19: 18 mg via INTRAVENOUS

## 2013-06-19 MED ORDER — HYDROMORPHONE HCL PF 1 MG/ML IJ SOLN
1.0000 mg | Freq: Once | INTRAMUSCULAR | Status: AC
  Administered 2013-06-19: 1 mg via INTRAVENOUS
  Filled 2013-06-19: qty 1

## 2013-06-19 MED ORDER — OXYCODONE-ACETAMINOPHEN 5-325 MG PO TABS: 1.0000 | ORAL_TABLET | ORAL | Status: DC | PRN

## 2013-06-19 MED ORDER — LORAZEPAM 2 MG/ML IJ SOLN
0.5000 mg | Freq: Once | INTRAMUSCULAR | Status: AC
Start: 2013-06-19 — End: 2013-06-19
  Administered 2013-06-19: 0.5 mg via INTRAVENOUS
  Filled 2013-06-19: qty 1

## 2013-06-19 NOTE — ED Provider Notes (Signed)
CSN: [id number]     Arrival date & time 06/19/13  2026 History   First MD Initiated Contact with Patient 06/19/13 2033     Chief Complaint  Patient presents with  . Fall      HPI Patient reports she was walking down her steps of her camper when she slipped and fell and injured her right ankle.  She presents to the emergency department with obvious right ankle deformity.  She received morphine and Zofran in route.  She has no other complaints beside severe 10 out of 10 right ankle pain.   Past Medical History  Diagnosis Date  . Rapid palpitations   . Coronary artery disease     Status post percutaneous coronary intervention and bare metal stenting  of the left circumflex and subsequent percutaneous transluminal coronary anioplasty and placement of a 3.0 NIR bare metal stent proximal to the previous stent.  . Hypertension   . Hyperlipidemia   . Obesity   . Gastroesophageal reflux disease   . Depression   . Osteoarthritis   . Status post hysterectomy   . History of pneumonia   . H/O: hysterectomy   . CHF (congestive heart failure)   . Esophageal varices     New 2013  . Cirrhosis of liver    Past Surgical History  Procedure Laterality Date  . Coronary artery bypass graft  May 31,2001    x 3 with a vein graft to the first diagonal, vein graft to the right coronary  artery, and a free left internal mammary  artery to the obtuse marginal   . Cardiac catheterization  2004    left internal mammary artery to the obtuse marginal  was found to be small and thread like.  The two grafts were patent.  The left circumflex had 90% in-stent restenosis and cutting balloon angioplasty was performed followed by placement of a 3.0 x 39m Taxus drug -eluting stent.    . Cardiac catheterization  2006    There was in-stent restenosis in the left circumflex and this was treated with cutting balloon angioplasty   . Cardiac catheterization  2008    vein graft to the to the obtuse marginal was patent,  although small, left circumflex had 40% in-stent restenosis, ejection fraction 40-45%.  The patient was medically mananged.  . Tonsillectomy    . Joint replacement    . Rotator cuff left  2007  . Back surgery    . Abdominal hysterectomy    . Colonoscopy  03/29/2011    Procedure: COLONOSCOPY;  Surgeon: MJamesetta So MD;  [facility]: [facility];  Service: Gastroenterology;  Laterality: N/A;  . Esophagogastroduodenoscopy  12/20/2011    Procedure: ESOPHAGOGASTRODUODENOSCOPY (EGD);  Surgeon: MJamesetta So MD;  Location: [facility];  Service: Gastroenterology;  Laterality: N/A;  . Esophagogastroduodenoscopy N/A 07/04/2012    Procedure: ESOPHAGOGASTRODUODENOSCOPY (EGD);  Surgeon: NRogene Houston MD;  [facility]: [facility];  Service: Endoscopy;  Laterality: N/A;  235-moved to 255 Ann to notify pt  . Esophageal banding N/A 07/04/2012    Procedure: ESOPHAGEAL BANDING;  Surgeon: NRogene Houston MD;  [facility]: [facility];  Service: Endoscopy;  Laterality: N/A;  . Esophagogastroduodenoscopy N/A 09/17/2012    Procedure: ESOPHAGOGASTRODUODENOSCOPY (EGD);  Surgeon: NRogene Houston MD;  [facility];  Service: Endoscopy;  Laterality: N/A;  730  . Esophageal banding N/A 09/17/2012    Procedure: ESOPHAGEAL BANDING;  Surgeon: NRogene Houston MD;  [facility]: [facility];  Service: Endoscopy;  Laterality: N/A;   Family History  Problem Relation Age of Onset  . Heart attack Mother   . Heart attack Father 33    cause of death  . Coronary artery disease Sister     CABG   . Colon cancer Neg Hx    History  Substance Use Topics  . Smoking status: Former Smoker -- 0.50 packs/day for 20 years    Types: Cigarettes    Quit date: 07/21/1995  . Smokeless tobacco: Never Used  . Alcohol Use: No   OB History   Grav Para Term Preterm Abortions TAB SAB Ect Mult Living                 Review of Systems  All other systems reviewed and are negative.     Allergies  Ace inhibitors;  Cefaclor; Cephalexin; Penicillins; Pregabalin; and Tape  Home Medications   Prior to Admission medications   Medication Sig Start Date End Date Taking? Authorizing Provider  ALPRAZolam Duanne Moron) 0.5 MG tablet Take 0.5 mg by mouth 3 (three) times daily as needed. For anxiety. Rarely takes.   Yes Historical Provider, MD  atorvastatin (LIPITOR) 20 MG tablet Take 20 mg by mouth daily at 6 PM.    Yes Historical Provider, MD  cetirizine (ZYRTEC) 10 MG tablet Take 10 mg by mouth daily.   Yes Historical Provider, MD  esomeprazole (NEXIUM) 40 MG capsule Take 40 mg by mouth daily before breakfast.    Yes Historical Provider, MD  ezetimibe (ZETIA) 10 MG tablet Take 10 mg by mouth at bedtime.    Yes Historical Provider, MD  FLUoxetine (PROZAC) 20 MG tablet Take 20 mg by mouth daily.    Yes Historical Provider, MD  furosemide (LASIX) 40 MG tablet Take  1 1/2 tab daily   Yes Historical Provider, MD  HYDROcodone-acetaminophen (NORCO) 10-325 MG per tablet Take 1 tablet by mouth every 6 (six) hours as needed.   Yes Historical Provider, MD  isosorbide mononitrate (IMDUR) 30 MG CR tablet Take 30 mg by mouth daily.    Yes Historical Provider, MD  metoprolol succinate (TOPROL-XL) 25 MG 24 hr tablet Take 25 mg by mouth daily.     Yes Historical Provider, MD  valsartan (DIOVAN) 160 MG tablet Take 80-160 mg by mouth 2 (two) times daily. Take one tab in the morning and 1/2 tab at bed time.   Yes Historical Provider, MD  oxyCODONE-acetaminophen (PERCOCET/ROXICET) 5-325 MG per tablet Take 1 tablet by mouth every 4 (four) hours as needed for severe pain. 06/19/13   Hoy Morn, MD   BP 133/54  Pulse 86  Temp(Src) 98 F (36.7 C) (Oral)  Resp 20  Wt 219 lb 2.2 oz (99.4 kg)  SpO2 93%  LMP 03/29/2011 Physical Exam  Nursing note and vitals reviewed. Constitutional: She is oriented to person, place, and time. She appears well-developed and well-nourished. No distress.  HENT:  Head: Normocephalic and atraumatic.  Eyes:  EOM are normal.  Neck: Normal range of motion.  Cardiovascular: Normal rate, regular rhythm and normal heart sounds.   Pulmonary/Chest: Effort normal and breath sounds normal.  Abdominal: Soft. She exhibits no distension. There is no tenderness.  Musculoskeletal: Normal range of motion.  Obvious severe deformity of right ankle.  Can wiggle toes.  Normal PT and DP pulse on the right foot.  No open component to her fracture.  Full range of motion of right knee and right hip.  Neurological: She is alert and  oriented to person, place, and time.  Skin: Skin is warm and dry.  Psychiatric: She has a normal mood and affect. Judgment normal.    ED Course  Procedures (including critical care time)  Procedural sedation Performed by: Hoy Morn Consent: Verbal consent obtained. Risks and benefits: risks, benefits and alternatives were discussed Required items: required blood products, implants, devices, and special equipment available Patient identity confirmed: arm band and provided demographic data Time out: Immediately prior to procedure a "time out" was called to verify the correct patient, procedure, equipment, support staff and site/side marked as required. Sedation type: moderate (conscious) sedation NPO time confirmed and considedered Sedatives: ETOMIDATE Physician Time at Bedside: 15 Vitals: Vital signs were monitored during sedation. Cardiac Monitor, pulse oximeter Patient tolerance: Patient tolerated the procedure well with no immediate complications. Comments: Pt with uneventful recovered. Returned to pre-procedural sedation baseline   Reduction of fracture/dislocation Performed by: Hoy Morn Authorized by: Hoy Morn Consent: Verbal consent obtained. Risks and benefits: risks, benefits and alternatives were discussed Consent given by: patient Required items: required blood products, implants, devices, and special equipment available Time out: Immediately prior to  procedure a "time out" was called to verify the correct patient, procedure, equipment, support staff and site/side marked as required. Patient sedated: etomidate Vitals: Vital signs were monitored during sedation. Patient tolerance: Patient tolerated the procedure well with no immediate complications. Joint: right ankle trimalleolar fracture Reduction technique: manipulation   SPLINT APPLICATION Date/Time: 0/09/1446 3:38 PM Authorized by: Hoy Morn Consent: Verbal consent obtained. Risks and benefits: risks, benefits and alternatives were discussed Consent given by: patient Splint applied by: orthopedic technician Location details:  Splint type: Posterior short leg stirrup  Supplies used: Ortho-Glass  Post-procedure: The splinted body part was neurovascularly unchanged following the procedure. Patient tolerance: Patient tolerated the procedure well with no immediate complications.     Labs Review Labs Reviewed - No data to display  Imaging Review Dg Tibia/fibula Left  06/19/2013   CLINICAL DATA:  Fall  EXAM: LEFT TIBIA AND FIBULA - 2 VIEW  COMPARISON:  None.  FINDINGS: No acute fracture. No dislocation. Deformity of the distal fibula likely represents healed fracture. Osteopenia.  IMPRESSION: No acute bony pathology.   Electronically Signed   By: Maryclare Bean M.D.   On: 06/19/2013 23:54   Dg Ankle Complete Right  06/19/2013   CLINICAL DATA:  Recent traumatic injury  EXAM: RIGHT ANKLE - COMPLETE 3+ VIEW  COMPARISON:  None.  FINDINGS: Limited two views of the ankle were obtained. There is an oblique fracture through the distal fibular metaphysis as well as fracture through the knee medial malleolus. There is also impaction fracture through the lateral aspect of tibia. Lateral displacement of the talus with respect to the tibia is noted. There is also some slight posterior displacement of the talus with respect the tibia. No other fractures are seen.  IMPRESSION: Complex fracture  dislocation of the ankle with posterolateral displacement of the talus with respect to the tibia   Electronically Signed   By: Inez Catalina M.D.   On: 06/19/2013 21:00   Dg Ankle Right Port  06/19/2013   CLINICAL DATA:  Fracture, postreduction  EXAM: PORTABLE RIGHT ANKLE - 2 VIEW  COMPARISON:  Earlier films of the same day  FINDINGS: Overlying cast material obscures fine bone detail. Fractures of the distal fibula and medial malleolus have been reduced, still distracted several mm. There has been reduction of the previous tibiotalar dislocation. Posterior malleolar  fracture distracted 7 mm.  IMPRESSION: Close reduction of trimalleolar fracture dislocation as above.   Electronically Signed   By: Arne Cleveland M.D.   On: 06/19/2013 22:02  I personally reviewed the imaging tests through PACS system I reviewed available ER/hospitalization records through the EMR    EKG Interpretation None      MDM   Final diagnoses:  Closed right trimalleolar fracture    Reduction of her right ankle try mouth fracture.  Patient tolerated procedural sedation well.  She is returned to baseline mental status.  Repeat films demonstrate significant improvement in alignment.  I discussed the case shortly with orthopedic surgeon Dr. Ninfa Linden who will see the patient in the office and likely operate next week.  Patient's compartments are soft.  Splinted.  At time of discharge the patient attempted to ambulate and began having pain in her left tibia.  This will be imaged at this time.    Hoy Morn, MD 06/20/13 0040

## 2013-06-19 NOTE — Progress Notes (Signed)
Orthopedic Tech Progress Note Patient Details:  Samantha [name] 1950/09/16 [id number]  Ortho Devices Type of Ortho Device: Ace wrap;Post (short leg) splint;Stirrup splint Ortho Device/Splint Location: RLE Ortho Device/Splint Interventions: Ordered;Application   Braulio Bosch 06/19/2013, 9:43 PM

## 2013-06-19 NOTE — ED Notes (Signed)
Patient reports weighing 211 lbs.

## 2013-06-19 NOTE — ED Notes (Signed)
Dr. Campos at the bedside.  

## 2013-06-19 NOTE — ED Notes (Signed)
Consent is signed, and IV site is patent.  O2 support available and functioning.  Ortho tech, Elberta Fortis, present to apply splint afterwards.

## 2013-06-19 NOTE — ED Notes (Signed)
Portable  Xray at the bedside.

## 2013-06-19 NOTE — ED Notes (Signed)
Patient was concerned about left leg that is sore.  An x-ray was ordered.  Patient has been transported to X-ray.

## 2013-06-19 NOTE — ED Notes (Signed)
Patient is back from X-ray.

## 2013-06-19 NOTE — ED Notes (Signed)
Per EMS, the patient was going on a camping trip, and fell off the steps of her camper.  Right angulated fracture stablized by EMS, and patient received 10mg  of morphine, and 4mg  of zofran.  22g in left wrist.  BP en route 160/84, pulse 78, O2 sat 100% on room air.  Patient alert and oriented, and reports pain 10/10.

## 2013-06-19 NOTE — ED Notes (Signed)
Clarified with Dr. Venora Maples, he would like to administer 18mg  of medication.  He administered at the bedside.

## 2013-06-19 NOTE — Discharge Instructions (Signed)
Ankle Fracture A fracture is a break in the bone. A cast or splint is used to protect and keep your injured bone from moving.  HOME CARE INSTRUCTIONS   Use your crutches as directed.  To lessen the swelling, keep the injured leg elevated while sitting or lying down.  Apply ice to the injury for 15-20 minutes, 03-04 times per day while awake for 2 days. Put the ice in a plastic bag and place a thin towel between the bag of ice and your cast.  If you have a plaster or fiberglass cast:  Do not try to scratch the skin under the cast using sharp or pointed objects.  Check the skin around the cast every day. You may put lotion on any red or sore areas.  Keep your cast dry and clean.  If you have a plaster splint:  Wear the splint as directed.  You may loosen the elastic around the splint if your toes become numb, tingle, or turn cold or blue.  Do not put pressure on any part of your cast or splint; it may break. Rest your cast only on a pillow the first 24 hours until it is fully hardened.  Your cast or splint can be protected during bathing with a plastic bag. Do not lower the cast or splint into water.  Take medications as directed by your caregiver. Only take over-the-counter or prescription medicines for pain, discomfort, or fever as directed by your caregiver.  Do not drive a vehicle until your caregiver specifically tells you it is safe to do so.  If your caregiver has given you a follow-up appointment, it is very important to keep that appointment. Not keeping the appointment could result in a chronic or permanent injury, pain, and disability. If there is any problem keeping the appointment, you must call back to this facility for assistance. SEEK IMMEDIATE MEDICAL CARE IF:   Your cast gets damaged or breaks.  You have continued severe pain or more swelling than you did before the cast was put on.  Your skin or toenails below the injury turn blue or gray, or feel cold or  numb.  There is a bad smell or new stains and/or purulent (pus like) drainage coming from under the cast. If you do not have a window in your cast for observing the wound, a discharge or minor bleeding may show up as a stain on the outside of your cast. Report these findings to your caregiver. MAKE SURE YOU:   Understand these instructions.  Will watch your condition.  Will get help right away if you are not doing well or get worse. Document Released: 02/12/2000 Document Revised: 05/09/2011 Document Reviewed: 09/13/2012 [facility] Patient Information 2014 [place], [place].  Cast or Splint Care Casts and splints support injured limbs and keep bones from moving while they heal. It is important to care for your cast or splint at home.  HOME CARE INSTRUCTIONS  Keep the cast or splint uncovered during the drying period. It can take 24 to 48 hours to dry if it is made of plaster. A fiberglass cast will dry in less than 1 hour.  Do not rest the cast on anything harder than a pillow for the first 24 hours.  Do not put weight on your injured limb or apply pressure to the cast until your health care provider gives you permission.  Keep the cast or splint dry. Wet casts or splints can lose their shape and may not support the limb  as well. A wet cast that has lost its shape can also create harmful pressure on your skin when it dries. Also, wet skin can become infected.  Cover the cast or splint with a plastic bag when bathing or when out in the rain or snow. If the cast is on the trunk of the body, take sponge baths until the cast is removed.  If your cast does become wet, dry it with a towel or a blow dryer on the cool setting only.  Keep your cast or splint clean. Soiled casts may be wiped with a moistened cloth.  Do not place any hard or soft foreign objects under your cast or splint, such as cotton, toilet paper, lotion, or powder.  Do not try to scratch the skin under the cast with any  object. The object could get stuck inside the cast. Also, scratching could lead to an infection. If itching is a problem, use a blow dryer on a cool setting to relieve discomfort.  Do not trim or cut your cast or remove padding from inside of it.  Exercise all joints next to the injury that are not immobilized by the cast or splint. For example, if you have a long leg cast, exercise the hip joint and toes. If you have an arm cast or splint, exercise the shoulder, elbow, thumb, and fingers.  Elevate your injured arm or leg on 1 or 2 pillows for the first 1 to 3 days to decrease swelling and pain.It is best if you can comfortably elevate your cast so it is higher than your heart. SEEK MEDICAL CARE IF:   Your cast or splint cracks.  Your cast or splint is too tight or too loose.  You have unbearable itching inside the cast.  Your cast becomes wet or develops a soft spot or area.  You have a bad smell coming from inside your cast.  You get an object stuck under your cast.  Your skin around the cast becomes red or raw.  You have new pain or worsening pain after the cast has been applied. SEEK IMMEDIATE MEDICAL CARE IF:   You have fluid leaking through the cast.  You are unable to move your fingers or toes.  You have discolored (blue or white), cool, painful, or very swollen fingers or toes beyond the cast.  You have tingling or numbness around the injured area.  You have severe pain or pressure under the cast.  You have any difficulty with your breathing or have shortness of breath.  You have chest pain. Document Released: 02/12/2000 Document Revised: 12/05/2012 Document Reviewed: 08/23/2012 [facility] Patient Information 2014 [place].

## 2013-06-19 NOTE — ED Notes (Signed)
Portable X-ray at the bedside. Crash cart and airway available for use.

## 2013-06-20 DIAGNOSIS — M79609 Pain in unspecified limb: Secondary | ICD-10-CM | POA: Diagnosis not present

## 2013-06-20 DIAGNOSIS — S82209A Unspecified fracture of shaft of unspecified tibia, initial encounter for closed fracture: Secondary | ICD-10-CM | POA: Diagnosis not present

## 2013-06-20 MED ORDER — HYDROCODONE-ACETAMINOPHEN 5-325 MG PO TABS
1.0000 | ORAL_TABLET | Freq: Once | ORAL | Status: AC
  Administered 2013-06-20: 1 via ORAL
  Filled 2013-06-20: qty 1

## 2013-06-20 NOTE — ED Notes (Signed)
Brandy, Respiratory Therapist at the bedside or assistance.

## 2013-06-20 NOTE — ED Provider Notes (Signed)
3:79 AM  PT had been given crutches, she is unable to use them as her L ankle and leg hurt.  X-rays were obtained and no apparent fractures. PT was given a wheelchair.  She lives with family but states that she can not use a wheel chair at home. Discussed with Dr Ninfa Linden and due to soft tissue swelling he will not her to the OR urgently, likely in about a week.    Plan [place] in the am for placement if unable to make arrangements to get patient back home.   Teressa Lower, MD 06/20/13 [phone]

## 2013-06-20 NOTE — Progress Notes (Signed)
Orthopedic Tech Progress Note Patient Details:  Samantha [name] November 27, 1950 [id number]  Ortho Devices Type of Ortho Device: Crutches;Ace wrap;CAM walker Ortho Device/Splint Location: RLE Ortho Device/Splint Interventions: Application   Theodoro Parma Cammer 06/20/2013, 5:06 AM

## 2013-06-26 DIAGNOSIS — S82853A Displaced trimalleolar fracture of unspecified lower leg, initial encounter for closed fracture: Secondary | ICD-10-CM | POA: Diagnosis not present

## 2013-06-27 DIAGNOSIS — Y999 Unspecified external cause status: Secondary | ICD-10-CM | POA: Diagnosis not present

## 2013-06-27 DIAGNOSIS — S82853A Displaced trimalleolar fracture of unspecified lower leg, initial encounter for closed fracture: Secondary | ICD-10-CM | POA: Diagnosis not present

## 2013-06-27 DIAGNOSIS — Y92009 Unspecified place in unspecified non-institutional (private) residence as the place of occurrence of the external cause: Secondary | ICD-10-CM | POA: Diagnosis not present

## 2013-06-27 DIAGNOSIS — Y939 Activity, unspecified: Secondary | ICD-10-CM | POA: Diagnosis not present

## 2013-06-27 DIAGNOSIS — G8918 Other acute postprocedural pain: Secondary | ICD-10-CM | POA: Diagnosis not present

## 2013-06-27 DIAGNOSIS — W19XXXA Unspecified fall, initial encounter: Secondary | ICD-10-CM | POA: Diagnosis not present

## 2013-07-10 DIAGNOSIS — S82853A Displaced trimalleolar fracture of unspecified lower leg, initial encounter for closed fracture: Secondary | ICD-10-CM | POA: Diagnosis not present

## 2013-07-24 ENCOUNTER — Other Ambulatory Visit (HOSPITAL_COMMUNITY): Payer: Self-pay | Admitting: Orthopaedic Surgery

## 2013-07-24 DIAGNOSIS — S82853A Displaced trimalleolar fracture of unspecified lower leg, initial encounter for closed fracture: Secondary | ICD-10-CM | POA: Diagnosis not present

## 2013-07-24 DIAGNOSIS — M810 Age-related osteoporosis without current pathological fracture: Secondary | ICD-10-CM

## 2013-07-29 ENCOUNTER — Ambulatory Visit (HOSPITAL_COMMUNITY)
Admission: RE | Admit: 2013-07-29 | Discharge: 2013-07-29 | Disposition: A | Discharge status: Home / Self Care | Payer: Medicare Other | Source: Ambulatory Visit | Attending: Orthopaedic Surgery | Admitting: Orthopaedic Surgery

## 2013-07-29 DIAGNOSIS — M899 Disorder of bone, unspecified: Secondary | ICD-10-CM | POA: Diagnosis not present

## 2013-07-29 DIAGNOSIS — M810 Age-related osteoporosis without current pathological fracture: Secondary | ICD-10-CM | POA: Diagnosis not present

## 2013-08-07 DIAGNOSIS — S82853A Displaced trimalleolar fracture of unspecified lower leg, initial encounter for closed fracture: Secondary | ICD-10-CM | POA: Diagnosis not present

## 2013-08-14 ENCOUNTER — Other Ambulatory Visit (INDEPENDENT_AMBULATORY_CARE_PROVIDER_SITE_OTHER): Payer: Self-pay | Admitting: *Deleted

## 2013-08-14 ENCOUNTER — Encounter (INDEPENDENT_AMBULATORY_CARE_PROVIDER_SITE_OTHER): Payer: Self-pay | Admitting: *Deleted

## 2013-08-14 DIAGNOSIS — K760 Fatty (change of) liver, not elsewhere classified: Secondary | ICD-10-CM

## 2013-08-21 DIAGNOSIS — S82853A Displaced trimalleolar fracture of unspecified lower leg, initial encounter for closed fracture: Secondary | ICD-10-CM | POA: Diagnosis not present

## 2013-09-03 ENCOUNTER — Encounter (INDEPENDENT_AMBULATORY_CARE_PROVIDER_SITE_OTHER): Payer: Self-pay | Admitting: *Deleted

## 2013-09-05 ENCOUNTER — Encounter (INDEPENDENT_AMBULATORY_CARE_PROVIDER_SITE_OTHER): Payer: Self-pay | Admitting: Internal Medicine

## 2013-09-05 ENCOUNTER — Telehealth (INDEPENDENT_AMBULATORY_CARE_PROVIDER_SITE_OTHER): Payer: Self-pay | Admitting: *Deleted

## 2013-09-05 NOTE — Telephone Encounter (Signed)
Patient is on my recall for f/u US -- will this need to be with elastography  -- please advise

## 2013-09-05 NOTE — Progress Notes (Signed)
This encounter was created in error - please disregard.

## 2013-09-06 ENCOUNTER — Other Ambulatory Visit (INDEPENDENT_AMBULATORY_CARE_PROVIDER_SITE_OTHER): Payer: Self-pay | Admitting: Internal Medicine

## 2013-09-06 DIAGNOSIS — K7460 Unspecified cirrhosis of liver: Secondary | ICD-10-CM

## 2013-09-06 NOTE — Telephone Encounter (Signed)
[place] sch'd 09/19/13, patient aware

## 2013-09-06 NOTE — Telephone Encounter (Signed)
No, she will not need elastography.

## 2013-09-10 ENCOUNTER — Ambulatory Visit: Payer: Medicare Other | Admitting: Cardiovascular Disease

## 2013-09-17 DIAGNOSIS — K7689 Other specified diseases of liver: Secondary | ICD-10-CM | POA: Diagnosis not present

## 2013-09-18 DIAGNOSIS — S82853A Displaced trimalleolar fracture of unspecified lower leg, initial encounter for closed fracture: Secondary | ICD-10-CM | POA: Diagnosis not present

## 2013-09-18 LAB — HEPATIC FUNCTION PANEL
ALT: 13 U/L (ref 0–35)
AST: 33 U/L (ref 0–37)
Albumin: 4.2 g/dL (ref 3.5–5.2)
Alkaline Phosphatase: 83 U/L (ref 39–117)
Bilirubin, Direct: 0.1 mg/dL (ref 0.0–0.3)
Indirect Bilirubin: 0.5 mg/dL (ref 0.2–1.2)
Total Bilirubin: 0.6 mg/dL (ref 0.2–1.2)
Total Protein: 7.4 g/dL (ref 6.0–8.3)

## 2013-09-19 ENCOUNTER — Ambulatory Visit (HOSPITAL_COMMUNITY)
Admission: RE | Admit: 2013-09-19 | Discharge: 2013-09-19 | Disposition: A | Discharge status: Home / Self Care | Payer: Medicare Other | Source: Ambulatory Visit | Attending: Internal Medicine | Admitting: Internal Medicine

## 2013-09-19 DIAGNOSIS — R161 Splenomegaly, not elsewhere classified: Secondary | ICD-10-CM | POA: Insufficient documentation

## 2013-09-19 DIAGNOSIS — K7460 Unspecified cirrhosis of liver: Secondary | ICD-10-CM

## 2013-09-20 DIAGNOSIS — M7989 Other specified soft tissue disorders: Secondary | ICD-10-CM | POA: Diagnosis not present

## 2013-09-20 DIAGNOSIS — M79609 Pain in unspecified limb: Secondary | ICD-10-CM | POA: Diagnosis not present

## 2013-09-24 ENCOUNTER — Ambulatory Visit (INDEPENDENT_AMBULATORY_CARE_PROVIDER_SITE_OTHER): Payer: Medicare Other | Admitting: Internal Medicine

## 2013-09-24 ENCOUNTER — Other Ambulatory Visit (INDEPENDENT_AMBULATORY_CARE_PROVIDER_SITE_OTHER): Payer: Self-pay | Admitting: *Deleted

## 2013-09-24 ENCOUNTER — Encounter (INDEPENDENT_AMBULATORY_CARE_PROVIDER_SITE_OTHER): Payer: Self-pay | Admitting: *Deleted

## 2013-09-24 ENCOUNTER — Encounter (INDEPENDENT_AMBULATORY_CARE_PROVIDER_SITE_OTHER): Payer: Self-pay | Admitting: Internal Medicine

## 2013-09-24 VITALS — BP 130/80 | HR 76 | Temp 97.9°F | Resp 18 | Ht 62.0 in | Wt 215.6 lb

## 2013-09-24 DIAGNOSIS — I2510 Atherosclerotic heart disease of native coronary artery without angina pectoris: Secondary | ICD-10-CM

## 2013-09-24 DIAGNOSIS — K7460 Unspecified cirrhosis of liver: Secondary | ICD-10-CM

## 2013-09-24 DIAGNOSIS — I8500 Esophageal varices without bleeding: Secondary | ICD-10-CM | POA: Diagnosis not present

## 2013-09-24 DIAGNOSIS — E669 Obesity, unspecified: Secondary | ICD-10-CM | POA: Insufficient documentation

## 2013-09-24 NOTE — Progress Notes (Signed)
Presenting complaint;  Followup for cirrhosis.  Subjective:  Patient is 63 year old Caucasian female with cirrhosis secondary to NAFLD complicated by large esophageal varices requiring banding. Last banding was 1 year ago. She has no complaints. She has good appetite. She is trying her best to lose weight. She has not been able to exercises she developed fracture to her right ankle 13 weeks ago and required surgery. She did not have postop complications. She denies abdominal pain melena or rectal bleeding. She says Nexium is working well; however heartburn returns within a day or 2 if she stops this medication.   Current Medications: Outpatient Encounter Prescriptions as of 09/24/2013  Medication Sig  . ALPRAZolam (XANAX) 0.5 MG tablet Take 0.5 mg by mouth 3 (three) times daily as needed. For anxiety. Rarely takes.  [name] atorvastatin (LIPITOR) 20 MG tablet Take 20 mg by mouth daily at 6 PM.   . cetirizine (ZYRTEC) 10 MG tablet Take 10 mg by mouth daily.  [name] esomeprazole (NEXIUM) 40 MG capsule Take 40 mg by mouth daily before breakfast.   . ezetimibe (ZETIA) 10 MG tablet Take 10 mg by mouth at bedtime.   [name] FLUoxetine (PROZAC) 20 MG tablet Take 20 mg by mouth daily.   . furosemide (LASIX) 40 MG tablet Take  1 1/2 tab daily  . HYDROcodone-acetaminophen (NORCO) 10-325 MG per tablet Take 1 tablet by mouth every 6 (six) hours as needed.  . isosorbide mononitrate (IMDUR) 30 MG CR tablet Take 30 mg by mouth daily.   . metoprolol succinate (TOPROL-XL) 25 MG 24 hr tablet Take 25 mg by mouth daily.    . valsartan (DIOVAN) 160 MG tablet Take 80-160 mg by mouth 2 (two) times daily. Take one tab in the morning and 1/2 tab at bed time.  . [DISCONTINUED] oxyCODONE-acetaminophen (PERCOCET/ROXICET) 5-325 MG per tablet Take 1 tablet by mouth every 4 (four) hours as needed for severe pain.     Objective: Blood pressure 130/80, pulse 76, temperature 97.9 F (36.6 C), temperature source Oral, resp. rate 18,  height 5' 2"  (1.575 m), weight 215 lb 9.6 oz (97.796 kg), last menstrual period 03/29/2011. Patient is alert and does not have asterixis. Conjunctiva is pink. Sclera is nonicteric Oropharyngeal mucosa is normal. No neck masses or thyromegaly noted. Cardiac exam with regular rhythm normal S1 and S2. No murmur or gallop noted. Lungs are clear to auscultation. Abdomen is full with easily palpable large spleen. Prominent and from left lobe of the liver. Abdomen is otherwise soft and nontender. Trace edema noted to right leg.  Labs/studies Results: LFTs from 09/17/2013  Bilirubin 0.6, BP 83, AST 33, ALT 13 and albumin 4.2.  AST and  ALT were 47 and 24 respectively 6 months ago.   ultrasound from 09/19/2013. Splenomegaly with spleen measuring 19.6 cm and cirrhotic appearing liver. No focal abnormalities or ascites noted.  Assessment:  #1. Cirrhosis secondary to NAFLD complicated by large esophageal varices initially banded in May 2014 and redo in July 2014. LFTs are normal. No evidence of ascites on recent ultrasonography. She is up-to-date for [facility] screening. #2. Obesity. Patient has lost 4 pounds since her last visit even though her physical activity was limited because of ankle fracture. She needs to keep working at it in order to slow down progression of her liver disease. #3. GERD. Therapy is working. #4. She is up-to-date on screening for CRC. Last colonoscopy was in generally 2013 by Dr. Aviva Signs.   Plan:  Patient encouraged to increase physical activity  gradually as tolerated. Goal is for her to get down to 200 pounds in the next 6 months. She will check with Dr. Burt Knack on her next visit if she could be switched to a nonselective beta blocker i.e. Nadolol since she has portal hypertension and metoprolol does not work for portal hypertension. She will undergo EGD with esophageal variceal banding if in September 2015. Office visit in 6 months.

## 2013-09-24 NOTE — Patient Instructions (Signed)
Talk with Dr.Cooper switching you from metoprolol to nadolol. EGD and possible variceal banding to be scheduled in September 2015.

## 2013-10-07 ENCOUNTER — Encounter (HOSPITAL_COMMUNITY): Payer: Self-pay | Admitting: Pharmacy Technician

## 2013-10-15 DIAGNOSIS — E119 Type 2 diabetes mellitus without complications: Secondary | ICD-10-CM | POA: Diagnosis not present

## 2013-10-15 DIAGNOSIS — E538 Deficiency of other specified B group vitamins: Secondary | ICD-10-CM | POA: Diagnosis not present

## 2013-10-15 DIAGNOSIS — D649 Anemia, unspecified: Secondary | ICD-10-CM | POA: Diagnosis not present

## 2013-10-15 DIAGNOSIS — E7800 Pure hypercholesterolemia, unspecified: Secondary | ICD-10-CM | POA: Diagnosis not present

## 2013-10-16 DIAGNOSIS — S82853A Displaced trimalleolar fracture of unspecified lower leg, initial encounter for closed fracture: Secondary | ICD-10-CM | POA: Diagnosis not present

## 2013-10-22 ENCOUNTER — Encounter (HOSPITAL_COMMUNITY): Payer: Self-pay | Admitting: *Deleted

## 2013-10-22 ENCOUNTER — Encounter (HOSPITAL_COMMUNITY)
Admission: RE | Disposition: A | Discharge status: Home / Self Care | Payer: Self-pay | Source: Ambulatory Visit | Attending: Internal Medicine

## 2013-10-22 ENCOUNTER — Ambulatory Visit (HOSPITAL_COMMUNITY)
Admission: RE | Admit: 2013-10-22 | Discharge: 2013-10-22 | Disposition: A | Discharge status: Home / Self Care | Payer: Medicare Other | Source: Ambulatory Visit | Attending: Internal Medicine | Admitting: Internal Medicine

## 2013-10-22 DIAGNOSIS — F3289 Other specified depressive episodes: Secondary | ICD-10-CM | POA: Insufficient documentation

## 2013-10-22 DIAGNOSIS — I8510 Secondary esophageal varices without bleeding: Secondary | ICD-10-CM | POA: Insufficient documentation

## 2013-10-22 DIAGNOSIS — E669 Obesity, unspecified: Secondary | ICD-10-CM | POA: Insufficient documentation

## 2013-10-22 DIAGNOSIS — K319 Disease of stomach and duodenum, unspecified: Secondary | ICD-10-CM | POA: Insufficient documentation

## 2013-10-22 DIAGNOSIS — E119 Type 2 diabetes mellitus without complications: Secondary | ICD-10-CM | POA: Diagnosis not present

## 2013-10-22 DIAGNOSIS — F329 Major depressive disorder, single episode, unspecified: Secondary | ICD-10-CM | POA: Diagnosis not present

## 2013-10-22 DIAGNOSIS — E7800 Pure hypercholesterolemia, unspecified: Secondary | ICD-10-CM | POA: Diagnosis not present

## 2013-10-22 DIAGNOSIS — Z79899 Other long term (current) drug therapy: Secondary | ICD-10-CM | POA: Diagnosis not present

## 2013-10-22 DIAGNOSIS — I8500 Esophageal varices without bleeding: Secondary | ICD-10-CM | POA: Diagnosis not present

## 2013-10-22 DIAGNOSIS — Z6839 Body mass index (BMI) 39.0-39.9, adult: Secondary | ICD-10-CM | POA: Diagnosis not present

## 2013-10-22 DIAGNOSIS — I10 Essential (primary) hypertension: Secondary | ICD-10-CM | POA: Insufficient documentation

## 2013-10-22 DIAGNOSIS — G619 Inflammatory polyneuropathy, unspecified: Secondary | ICD-10-CM | POA: Diagnosis not present

## 2013-10-22 DIAGNOSIS — E785 Hyperlipidemia, unspecified: Secondary | ICD-10-CM | POA: Diagnosis not present

## 2013-10-22 DIAGNOSIS — K766 Portal hypertension: Secondary | ICD-10-CM | POA: Diagnosis not present

## 2013-10-22 DIAGNOSIS — K7460 Unspecified cirrhosis of liver: Secondary | ICD-10-CM | POA: Diagnosis not present

## 2013-10-22 DIAGNOSIS — Z87891 Personal history of nicotine dependence: Secondary | ICD-10-CM | POA: Diagnosis not present

## 2013-10-22 DIAGNOSIS — I209 Angina pectoris, unspecified: Secondary | ICD-10-CM | POA: Diagnosis not present

## 2013-10-22 DIAGNOSIS — Z951 Presence of aortocoronary bypass graft: Secondary | ICD-10-CM | POA: Diagnosis not present

## 2013-10-22 DIAGNOSIS — F411 Generalized anxiety disorder: Secondary | ICD-10-CM | POA: Diagnosis not present

## 2013-10-22 HISTORY — PX: ESOPHAGOGASTRODUODENOSCOPY: SHX5428

## 2013-10-22 HISTORY — PX: ESOPHAGEAL BANDING: SHX5518

## 2013-10-22 SURGERY — EGD (ESOPHAGOGASTRODUODENOSCOPY)
Anesthesia: Moderate Sedation

## 2013-10-22 MED ORDER — MEPERIDINE HCL 50 MG/ML IJ SOLN
INTRAMUSCULAR | Status: AC
  Filled 2013-10-22: qty 1

## 2013-10-22 MED ORDER — SODIUM CHLORIDE 0.9 % IV SOLN
INTRAVENOUS | Status: DC
Start: 2013-10-22 — End: 2013-10-22
  Administered 2013-10-22: 07:00:00 via INTRAVENOUS

## 2013-10-22 MED ORDER — MEPERIDINE HCL 50 MG/ML IJ SOLN
INTRAMUSCULAR | Status: DC | PRN
  Administered 2013-10-22 (×2): 25 mg via INTRAVENOUS

## 2013-10-22 MED ORDER — ONDANSETRON HCL 4 MG/2ML IJ SOLN: INTRAMUSCULAR | Status: DC | PRN

## 2013-10-22 MED ORDER — BUTAMBEN-TETRACAINE-BENZOCAINE 2-2-14 % EX AERO
INHALATION_SPRAY | CUTANEOUS | Status: DC | PRN
  Administered 2013-10-22: 2 via TOPICAL

## 2013-10-22 MED ORDER — MIDAZOLAM HCL 5 MG/5ML IJ SOLN
INTRAMUSCULAR | Status: AC
  Filled 2013-10-22: qty 5

## 2013-10-22 MED ORDER — STERILE WATER FOR IRRIGATION IR SOLN
Status: DC | PRN
  Administered 2013-10-22: 08:00:00

## 2013-10-22 MED ORDER — MIDAZOLAM HCL 5 MG/5ML IJ SOLN
INTRAMUSCULAR | Status: DC | PRN
  Administered 2013-10-22 (×3): 2 mg via INTRAVENOUS
  Administered 2013-10-22 (×2): 3 mg via INTRAVENOUS

## 2013-10-22 MED ORDER — MIDAZOLAM HCL 5 MG/5ML IJ SOLN
INTRAMUSCULAR | Status: AC
  Filled 2013-10-22: qty 10

## 2013-10-22 NOTE — H&P (Signed)
Samantha [name] is an 63 y.o. female.   Chief Complaint: Patient is here for EGD and possible esophageal variceal banding. 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 She is returning for repeat EGD and banding if varices have recurred. She denies nausea vomiting abdominal pain melena or frank rectal bleeding. At times she notices blood in the tissue felt to be due to hemorrhoids. She continues to have constant right ankle pain thought to be due to reaction to platelets. She had surgery 4 months ago.  Past Medical History  Diagnosis Date  . Rapid palpitations   . Coronary artery disease     Status post percutaneous coronary intervention and bare metal stenting  of the left circumflex and subsequent percutaneous transluminal coronary anioplasty and placement of a 3.0 NIR bare metal stent proximal to the previous stent.  . Hypertension   . Hyperlipidemia   . Obesity   . Gastroesophageal reflux disease   . Depression   . Osteoarthritis   . Status post hysterectomy   . History of pneumonia   . H/O: hysterectomy   . CHF (congestive heart failure)   . Esophageal varices     New 2013  . Cirrhosis of liver     Past Surgical History  Procedure Laterality Date  . Coronary artery bypass graft  May 31,2001    x 3 with a vein graft to the first diagonal, vein graft to the right coronary  artery, and a free left internal mammary  artery to the obtuse marginal   . Cardiac catheterization  2004    left internal mammary artery to the obtuse marginal  was found to be small and thread like.  The two grafts were patent.  The left circumflex had 90% in-stent restenosis and cutting balloon angioplasty was performed followed by placement of a 3.0 x 56m Taxus drug -eluting stent.    . Cardiac catheterization  2006    There was in-stent  restenosis in the left circumflex and this was treated with cutting balloon angioplasty   . Cardiac catheterization  2008    vein graft to the to the obtuse marginal was patent, although small, left circumflex had 40% in-stent restenosis, ejection fraction 40-45%.  The patient was medically mananged.  . Tonsillectomy    . Joint replacement    . Rotator cuff left  2007  . Back surgery    . Abdominal hysterectomy    . Colonoscopy  03/29/2011    Procedure: COLONOSCOPY;  Surgeon: MJamesetta So MD;  Location: [facility];  Service: Gastroenterology;  Laterality: N/A;  . Esophagogastroduodenoscopy  12/20/2011    Procedure: ESOPHAGOGASTRODUODENOSCOPY (EGD);  Surgeon: MJamesetta So MD;  Location: [facility];  Service: Gastroenterology;  Laterality: N/A;  . Esophagogastroduodenoscopy N/A 07/04/2012    Procedure: ESOPHAGOGASTRODUODENOSCOPY (EGD);  Surgeon: NRogene Houston MD;  Location: [facility];  Service: Endoscopy;  Laterality: N/A;  235-moved to 255 Ann to notify pt  . Esophageal banding N/A 07/04/2012    Procedure: ESOPHAGEAL BANDING;  Surgeon: NRogene Houston MD;  Location: [facility];  Service: Endoscopy;  Laterality: N/A;  . Esophagogastroduodenoscopy N/A 09/17/2012    Procedure: ESOPHAGOGASTRODUODENOSCOPY (EGD);  Surgeon: NRogene Houston MD;  [facility]: [facility];  Service: Endoscopy;  Laterality: N/A;  730  . Esophageal banding N/A 09/17/2012    Procedure: ESOPHAGEAL  BANDING;  Surgeon: Rogene Houston, MD;  Location: [facility];  Service: Endoscopy;  Laterality: N/A;    Family History  Problem Relation Age of Onset  . Heart attack Mother   . Heart attack Father 19    cause of death  . Coronary artery disease Sister     CABG   . Colon cancer Neg Hx    Social History:  reports that she quit smoking about 18 years ago. Her smoking use included Cigarettes. She has a 10 pack-year smoking history. She has never used smokeless tobacco. She reports that she does not drink  alcohol or use illicit drugs.  Allergies:  Allergies  Allergen Reactions  . Ace Inhibitors Other (See Comments)  . Cefaclor Itching and Rash  . Cephalexin Itching and Rash  . Penicillins Rash  . Pregabalin Other (See Comments)    Retains fluid  . Tape Itching and Rash    Takes skin right off with medical tape    Medications Prior to Admission  Medication Sig Dispense Refill  . ALPRAZolam (XANAX) 0.5 MG tablet Take 0.5 mg by mouth 3 (three) times daily as needed for anxiety.       [name] aspirin EC 81 MG tablet Take 81 mg by mouth daily.      [name] atorvastatin (LIPITOR) 20 MG tablet Take 20 mg by mouth daily at 6 PM.       . cetirizine (ZYRTEC) 10 MG tablet Take 10 mg by mouth daily.      [name] esomeprazole (NEXIUM) 40 MG capsule Take 40 mg by mouth daily before breakfast.       . ezetimibe (ZETIA) 10 MG tablet Take 10 mg by mouth at bedtime.       [name] FLUoxetine (PROZAC) 20 MG tablet Take 20 mg by mouth daily.       . furosemide (LASIX) 40 MG tablet Take  1 1/2 tab daily      . HYDROcodone-acetaminophen (NORCO) 10-325 MG per tablet Take 1 tablet by mouth every 6 (six) hours as needed for moderate pain.       . isosorbide mononitrate (IMDUR) 30 MG CR tablet Take 30 mg by mouth daily.       . metoprolol succinate (TOPROL-XL) 25 MG 24 hr tablet Take 25 mg by mouth daily.        . valsartan (DIOVAN) 160 MG tablet Take 80-160 mg by mouth 2 (two) times daily. Take one tab in the morning and 1/2 tab at bed time.        No results found for this or any previous visit (from the past 48 hour(s)). No results found.  ROS  Blood pressure 160/66, pulse 68, temperature 97.7 F (36.5 C), temperature source Oral, resp. rate 21, height 5' 2"  (1.575 m), weight 215 lb (97.523 kg), last menstrual period 03/29/2011, SpO2 97.00%. Physical Exam  Constitutional: She appears well-developed and well-nourished.  HENT:  Mouth/Throat: Oropharynx is clear and moist.  Eyes: Conjunctivae are normal.  Neck: No  thyromegaly present.  Cardiovascular: Normal rate, regular rhythm and normal heart sounds.   No murmur heard. Respiratory: Effort normal.  GI: Soft. She exhibits no distension and no mass.  Easily palpable spleen  Musculoskeletal: Edema: trace edema involving both legs.  Right ankle swollen  Lymphadenopathy:    She has no cervical adenopathy.  Neurological: She is alert.  Skin: Skin is warm and dry.     Assessment/Plan Cirrhosis with known esophageal varices. EGD and possible variceal banding.  Vong Garringer U 10/22/2013, 7:33 AM

## 2013-10-22 NOTE — Op Note (Addendum)
EGD PROCEDURE REPORT  PATIENT:  Samantha [name]  MR#:  [id number] Birthdate:  Jul 13, 1950, 63 y.o., female Endoscopist:  Dr. Rogene Houston, MD   Procedure Date: 10/22/2013  Procedure:   EGD with esophageal variceal banding.  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 She is returning for reevaluation.            Informed Consent:  The risks, benefits, alternatives & imponderables which include, but are not limited to, bleeding, infection, perforation, drug reaction and potential missed lesion have been reviewed.  The potential for biopsy, lesion removal, esophageal dilation, etc. have also been discussed.  Questions have been answered.  All parties agreeable.  Please see history & physical in medical record for more information.  Medications:  Demerol 50 mg IV Versed 12 mg IV Cetacaine spray topically for oropharyngeal anesthesia  Description of procedure:  The endoscope was introduced through the mouth and advanced to the second portion of the duodenum without difficulty or limitations. The mucosal surfaces were surveyed very carefully during advancement of the scope and upon withdrawal.  Findings:  Esophagus:  Mucosa of the proximal segment appeared to be normal. Scarring noted the distal esophagus from previous banding. Single grade III varix noted at 7o'clock with red markings. Other varices were small. GEJ:  39 cm Stomach: Stomach was empty and distended very well with insufflation. Folds in the proximal stomach were normal. Examination of mucosa revealed mosaic pattern at gastric body and patchy erythema in antrum. No erosions or ulcers were noted to single small varix was noted at the fundus. Duodenum:  Normal bulbar and post bulbar mucosa.  Therapeutic/Diagnostic Maneuvers Performed:   Endoscope was withdrawn and multiple banding device was  loaded onto the scope and was reintroduced into the distal esophagus. Single band was applied to large varix on the left and endoscope was withdrawn.   Complications:  None  Impression: Single large esophageal varix was banded. Focal esophageal scarring from previous banding sessions. Mild portal gastropathy. Single small fundal varix.  Recommendations:  Patient has appointment with Dr. [name] later today and will ask if she could be switched from metoprolol to propranolol. She has an appointment to see him later today. Once again patient reminded to increase physical activity and lose weight. Office visit in January 2016 as planned.    Lorna Strother U  10/22/2013  8:10 AM  CC: Dr. [name], MD & Dr. Rayne Du ref. provider found

## 2013-10-22 NOTE — Discharge Instructions (Signed)
No aspirin for one week resume the medications as before. Talk with Dr. Quintin Alto about switching from metoprolol to propranolol on your next visit. Full liquids today and usual diet starting in a.m. No driving for 24 hours. Office visit in January 2016 as planned.  Full Liquid Diet A full liquid diet may be used:   To help you transition from a clear liquid diet to a soft diet.   When your body is healing and can only tolerate foods that are easy to digest.  Before or after certain a procedure, test, or surgery (such as stomach or intestinal surgeries).   If you have trouble swallowing or chewing.  A full liquid diet includes fluids and foods that are liquid or will become liquid at room temperature. The full liquid diet gives you the proteins, fluids, salts, and minerals that you need for energy. If you continue this diet for more than 72 hours, talk to your health care provider about how many calories you need to consume. If you continue the diet for more than 5 days, talk to your health care provider about taking a multivitamin or a nutritional supplement. WHAT DO I NEED TO KNOW ABOUT A FULL LIQUID DIET?  You may have any liquid.  You may have any food that becomes a liquid at room temperature. The food is considered a liquid if it can be poured off a spoon at room temperature.  Drink one serving of citrus or vitamin C-enriched fruit juice daily. WHAT FOODS CAN I EAT? Grains Any grain food that can be pureed in soup (such as crackers, pasta, and rice). Hot cereal (such as farina or oatmeal) that has been blended. Talk to your health care provider or dietitian about these foods. Vegetables Pulp-free tomato or vegetable juice. Vegetables pureed in soup.  Fruits Fruit juice, including nectars and juices with pulp. Meats and Other Protein Sources Eggs in custard, eggnog mix, and eggs used in ice cream or pudding. Strained meats, like in baby food, may be allowed. Consult your health  care provider.  Dairy Milk and milk-based beverages, including milk shakes and instant breakfast mixes. Smooth yogurt. Pureed cottage cheese. Avoid these foods if they are not well tolerated. Beverages All beverages, including liquid nutritional supplements. Ask your health care provider if you can have carbonated beverages. They may not be well tolerated. Condiments Iodized salt, pepper, spices, and flavorings. Cocoa powder. Vinegar, ketchup, yellow mustard, smooth sauces (such as hollandaise, cheese sauce, or white sauce), and soy sauce. Sweets and Desserts Custard, smooth pudding. Flavored gelatin. Tapioca, junket. Plain ice cream, sherbet, fruit ices. Frozen ice pops, frozen fudge pops, pudding pops, and other frozen bars with cream. Syrups, including chocolate syrup. Sugar, honey, jelly.  Fats and Oils Margarine, butter, cream, sour cream, and oils. Other Broth and cream soups. Strained, broth-based soups. The items listed above may not be a complete list of recommended foods or beverages. Contact your dietitian for more options.  WHAT FOODS CAN I NOT EAT? Grains All breads. Grains are not allowed unless they are pureed into soup. Vegetables Vegetables are not allowed unless they are juiced, or cooked and pureed into soup. Fruits Fruits are not allowed unless they are juiced. Meats and Other Protein Sources Any meat or fish. Cooked or raw eggs. Nut butters.  Dairy Cheese.  Condiments Stone ground mustards. Fats and Oils Fats that are coarse or chunky. Sweets and Desserts Ice cream or other frozen desserts that have any solids in them or on top,  such as nuts, chocolate chips, and pieces of cookies. Cakes. Cookies. Candy. Others Soups with chunks or pieces in them. The items listed above may not be a complete list of foods and beverages to avoid. Contact your dietitian for more information. Document Released: 02/14/2005 Document Revised: 02/19/2013 Document Reviewed:  12/20/2012 [facility] Patient Information 2015 [place], [place]. This information is not intended to replace advice given to you by your health care provider. Make sure you discuss any questions you have with your health care provider. Esophagogastroduodenoscopy Care After Refer to this sheet in the next few weeks. These instructions provide you with information on caring for yourself after your procedure. Your caregiver may also give you more specific instructions. Your treatment has been planned according to current medical practices, but problems sometimes occur. Call your caregiver if you have any problems or questions after your procedure.  HOME CARE INSTRUCTIONS  Do not eat or drink anything until the numbing medicine (local anesthetic) has worn off and your gag reflex has returned. You will know that the local anesthetic has worn off when you can swallow comfortably.  Do not drive for 12 hours after the procedure or as directed by your caregiver.  Only take medicines as directed by your caregiver. SEEK MEDICAL CARE IF:   You cannot stop coughing.  You are not urinating at all or less than usual. SEEK IMMEDIATE MEDICAL CARE IF:  You have difficulty swallowing.  You cannot eat or drink.  You have worsening throat or chest pain.  You have dizziness, lightheadedness, or you faint.  You have nausea or vomiting.  You have chills.  You have a fever.  You have severe abdominal pain.  You have black, tarry, or bloody stools. Document Released: 02/01/2012 Document Reviewed: 02/01/2012 [facility] Patient Information 2015 [place]. This information is not intended to replace advice given to you by your health care provider. Make sure you discuss any questions you have with your health care provider.

## 2013-10-24 ENCOUNTER — Encounter (HOSPITAL_COMMUNITY): Payer: Self-pay | Admitting: Internal Medicine

## 2013-11-15 DIAGNOSIS — I10 Essential (primary) hypertension: Secondary | ICD-10-CM | POA: Diagnosis not present

## 2013-11-15 DIAGNOSIS — Z23 Encounter for immunization: Secondary | ICD-10-CM | POA: Diagnosis not present

## 2013-11-15 DIAGNOSIS — K7460 Unspecified cirrhosis of liver: Secondary | ICD-10-CM | POA: Diagnosis not present

## 2013-11-15 DIAGNOSIS — I209 Angina pectoris, unspecified: Secondary | ICD-10-CM | POA: Diagnosis not present

## 2013-11-15 DIAGNOSIS — E7800 Pure hypercholesterolemia, unspecified: Secondary | ICD-10-CM | POA: Diagnosis not present

## 2013-11-15 DIAGNOSIS — F411 Generalized anxiety disorder: Secondary | ICD-10-CM | POA: Diagnosis not present

## 2013-11-15 DIAGNOSIS — E538 Deficiency of other specified B group vitamins: Secondary | ICD-10-CM | POA: Diagnosis not present

## 2013-11-15 DIAGNOSIS — I8500 Esophageal varices without bleeding: Secondary | ICD-10-CM | POA: Diagnosis not present

## 2013-11-15 DIAGNOSIS — E119 Type 2 diabetes mellitus without complications: Secondary | ICD-10-CM | POA: Diagnosis not present

## 2013-11-27 DIAGNOSIS — M25539 Pain in unspecified wrist: Secondary | ICD-10-CM | POA: Diagnosis not present

## 2013-11-29 DIAGNOSIS — S4991XA Unspecified injury of right shoulder and upper arm, initial encounter: Secondary | ICD-10-CM | POA: Diagnosis not present

## 2013-11-29 DIAGNOSIS — M25512 Pain in left shoulder: Secondary | ICD-10-CM | POA: Diagnosis not present

## 2013-11-29 DIAGNOSIS — M7551 Bursitis of right shoulder: Secondary | ICD-10-CM | POA: Diagnosis not present

## 2013-12-04 DIAGNOSIS — M25511 Pain in right shoulder: Secondary | ICD-10-CM | POA: Diagnosis not present

## 2013-12-04 DIAGNOSIS — M719 Bursopathy, unspecified: Secondary | ICD-10-CM | POA: Diagnosis not present

## 2013-12-13 ENCOUNTER — Other Ambulatory Visit: Payer: Self-pay

## 2013-12-23 ENCOUNTER — Ambulatory Visit (INDEPENDENT_AMBULATORY_CARE_PROVIDER_SITE_OTHER): Payer: Medicare Other | Admitting: Cardiovascular Disease

## 2013-12-23 ENCOUNTER — Encounter: Payer: Self-pay | Admitting: Cardiovascular Disease

## 2013-12-23 VITALS — BP 148/86 | Ht 62.0 in | Wt 212.8 lb

## 2013-12-23 DIAGNOSIS — I25709 Atherosclerosis of coronary artery bypass graft(s), unspecified, with unspecified angina pectoris: Secondary | ICD-10-CM

## 2013-12-23 DIAGNOSIS — I2510 Atherosclerotic heart disease of native coronary artery without angina pectoris: Secondary | ICD-10-CM | POA: Diagnosis not present

## 2013-12-23 DIAGNOSIS — I209 Angina pectoris, unspecified: Secondary | ICD-10-CM

## 2013-12-23 LAB — BASIC METABOLIC PANEL
BUN: 12 mg/dL (ref 6–23)
CO2: 33 mEq/L — ABNORMAL HIGH (ref 19–32)
Calcium: 9.3 mg/dL (ref 8.4–10.5)
Chloride: 105 mEq/L (ref 96–112)
Creatinine, Ser: 0.9 mg/dL (ref 0.4–1.2)
GFR: 70.31 mL/min (ref >60.00)
Glucose, Bld: 108 mg/dL — ABNORMAL HIGH (ref 70–99)
Potassium: 4.8 mEq/L (ref 3.5–5.1)
Sodium: 142 mEq/L (ref 135–145)

## 2013-12-23 LAB — CBC
HCT: 38.8 % (ref 36.0–46.0)
Hemoglobin: 12.2 g/dL (ref 12.0–15.0)
MCHC: 31.4 g/dL (ref 30.0–36.0)
MCV: 77 fl — ABNORMAL LOW (ref 78.0–100.0)
Platelets: 87 10*3/uL — ABNORMAL LOW (ref 150.0–400.0)
RBC: 5.05 Mil/uL (ref 3.87–5.11)
RDW: 22.3 % — ABNORMAL HIGH (ref 11.5–15.5)
WBC: 4.6 10*3/uL (ref 4.0–10.5)

## 2013-12-23 LAB — PROTIME-INR
INR: 1.1 ratio — ABNORMAL HIGH (ref 0.8–1.0)
Prothrombin Time: 12.5 s (ref 9.6–13.1)

## 2013-12-23 NOTE — Progress Notes (Signed)
Background: The patient is followed for coronary artery disease. She has undergone CABG in 2001. She subsequently had coronary stenting and Cutting Balloon angioplasty. She has a long Taxus drug-eluting stent it was used to treat in-stent restenosis in the left circumflex. She had been maintained on long-term dual antiplatelet therapy. 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 She has undergone banding. Plavix has been discontinued. Her last echocardiogram was done in 2012 and her left ventricular ejection fraction is 50% with inferolateral and posterior wall hypokinesis. Her most recent heart catheterization from 2013 demonstrating continued patency of her bypass grafts and stent site.  HPI:  63 year old woman presenting for follow-up evaluation. The patient was last seen in January 2015. Recently complains of severe 'indigestion.' She's been taking lots of Tums without much relief. Also has been taking Nexium. Feels like heartburn and choking, even with upper back pain. Has had vomiting associated with this. Sometimes with left arm pain but has 'pinched nerve' and thinks pain may be related to that. Walks on the treadmill regularly but has no symptoms with physical exertion. Symptoms are progressive and patient continues to feel worse - has become very worried about her heart. Has associated shortness of breath and weakness, but no lightheadedness or syncope.  Studies:  Stress Myoview scan 04/04/2011: There is evidence of inferolateral scar with mild peri-infarct ischemia. Gated LVEF 46%.  Outpatient Encounter Prescriptions as of 12/23/2013  Medication Sig  . ALPRAZolam (XANAX) 0.5 MG tablet Take 0.5 mg by mouth 3 (three) times daily as needed for anxiety.   [name] aspirin (ASPIRIN EC) 81 MG EC tablet Take 81 mg by mouth daily. Swallow whole.  [name] atorvastatin (LIPITOR) 20 MG tablet Take 20 mg by mouth daily at 6 PM.   . cetirizine (ZYRTEC) 10 MG tablet Take 10 mg  by mouth daily as needed for allergies.   [name] esomeprazole (NEXIUM) 40 MG capsule Take 40 mg by mouth daily before breakfast.   . ezetimibe (ZETIA) 10 MG tablet Take 10 mg by mouth at bedtime.   . furosemide (LASIX) 40 MG tablet Take 60 mg by mouth daily. Take  1 1/2 tab daily  . isosorbide mononitrate (IMDUR) 30 MG CR tablet Take 30 mg by mouth daily.   [name] NITROSTAT 0.4 MG SL tablet Place 0.4 mg under the tongue every 5 (five) minutes as needed for chest pain.   . Oxycodone HCl 10 MG TABS Take 10 mg by mouth 4 (four) times daily as needed (pain).   . propranolol ER (INDERAL LA) 120 MG 24 hr capsule Take 120 mg by mouth daily.  . valsartan (DIOVAN) 160 MG tablet Take 80-160 mg by mouth 2 (two) times daily. Take one tab in the morning and 1/2 tab at bed time.  . Vitamin D, Ergocalciferol, (DRISDOL) 50000 UNITS CAPS capsule Take 50,000 Units by mouth every 7 (seven) days. On mondays  . [DISCONTINUED] FLUoxetine (PROZAC) 20 MG tablet Take 20 mg by mouth daily.   . [DISCONTINUED] HYDROcodone-acetaminophen (NORCO) 10-325 MG per tablet Take 1 tablet by mouth every 6 (six) hours as needed for moderate pain.   . [DISCONTINUED] metoprolol succinate (TOPROL-XL) 25 MG 24 hr tablet Take 25 mg by mouth daily.      Allergies  Allergen Reactions  . Ace Inhibitors Other (See Comments)  . Cefaclor Itching and Rash  . Cephalexin Itching and Rash  . Penicillins Rash  . Pregabalin Other (See Comments)    Retains fluid  . Tape Itching  and Rash    Takes skin right off with medical tape    Past Medical History  Diagnosis Date  . Rapid palpitations   . Coronary artery disease     Status post percutaneous coronary intervention and bare metal stenting  of the left circumflex and subsequent percutaneous transluminal coronary anioplasty and placement of a 3.0 NIR bare metal stent proximal to the previous stent.  . Hypertension   . Hyperlipidemia   . Obesity   . Gastroesophageal reflux disease   . Depression     . Osteoarthritis   . Status post hysterectomy   . History of pneumonia   . H/O: hysterectomy   . CHF (congestive heart failure)   . Esophageal varices     New 2013  . Cirrhosis of liver     family history includes Coronary artery disease in her sister; Heart attack in her mother; Heart attack (age of onset: 90) in her father. There is no history of Colon cancer.   ROS: Negative except as per HPI  BP 148/86  Ht 5' 2"  (1.575 m)  Wt 212 lb 12.8 oz (96.525 kg)  BMI 38.91 kg/m2  LMP 03/29/2011  PHYSICAL EXAM: Pt is alert and oriented, pleasant obese woman in NAD HEENT: normal Neck: JVP - normal, carotids 2+= without bruits Lungs: CTA bilaterally CV: RRR without murmur or gallop Abd: soft, NT, Positive BS, no hepatomegaly Ext: mild bilateral pretibial edema, distal pulses intact and equal Skin: warm/dry no rash  EKG:  NSR 70 bpm, left IVCD, nonspecific ST-T changes  ASSESSMENT AND PLAN: 1. Coronary artery disease, native vessel, with progressive symptoms concerning for crescendo angina. Differential includes GI versus cardiac etiologies, but concerning that GI therapies have been ineffective. Considering her extensive cardiac history, favor definitive evaluation with cardiac cath and possible PCI. Presence of cirrhosis/varices will have to be considered if PCI necessary. Reviewed recent records and she last underwent banding of varices in August 2015. She's been off of all antiplatelet Rx because of concern regarding bleeding risk.  I have reviewed the risks, indications, and alternatives to cardiac catheterization were reviewed with the patient. Risks include but are not limited to bleeding, infection, vascular injury, stroke, myocardial infection, arrhythmia, kidney injury, radiation-related injury in the case of prolonged fluoroscopy use, emergency cardiac surgery, and death. The patient understands the risks of serious complication is low (<7%).   2. Essential hypertension.  Continue current Rx.   3. Hyperlipidemia. Treated with atorvastatin and Zetia. Followed by PCP.  Sherren Mocha, MD 12/24/2013 11:12 PM

## 2013-12-23 NOTE — Patient Instructions (Addendum)
Your physician has requested that you have a cardiac catheterization. Cardiac catheterization is used to diagnose and/or treat various heart conditions. Doctors may recommend this procedure for a number of different reasons. The most common reason is to evaluate chest pain. Chest pain can be a symptom of coronary artery disease (CAD), and cardiac catheterization can show whether plaque is narrowing or blocking your heart's arteries. This procedure is also used to evaluate the valves, as well as measure the blood flow and oxygen levels in different parts of your heart. For further information please visit [email or web address]. Please follow instruction sheet, as given.  Your physician recommends that you continue on your current medications as directed. Please refer to the Current Medication list given to you today.  Your physician recommends that you have lab work today: BMP, CBC and PT/INR

## 2013-12-24 ENCOUNTER — Encounter: Payer: Self-pay | Admitting: Cardiovascular Disease

## 2013-12-24 ENCOUNTER — Encounter (HOSPITAL_COMMUNITY): Payer: Self-pay | Admitting: Pharmacy Technician

## 2013-12-25 ENCOUNTER — Encounter (HOSPITAL_COMMUNITY)
Admission: RE | Disposition: A | Discharge status: Home / Self Care | Payer: Self-pay | Source: Ambulatory Visit | Attending: Cardiovascular Disease

## 2013-12-25 ENCOUNTER — Ambulatory Visit (HOSPITAL_COMMUNITY)
Admission: RE | Admit: 2013-12-25 | Discharge: 2013-12-25 | Disposition: A | Discharge status: Home / Self Care | Payer: Medicare Other | Source: Ambulatory Visit | Attending: Cardiovascular Disease | Admitting: Cardiovascular Disease

## 2013-12-25 DIAGNOSIS — I25118 Atherosclerotic heart disease of native coronary artery with other forms of angina pectoris: Secondary | ICD-10-CM | POA: Diagnosis not present

## 2013-12-25 DIAGNOSIS — I2510 Atherosclerotic heart disease of native coronary artery without angina pectoris: Secondary | ICD-10-CM | POA: Insufficient documentation

## 2013-12-25 DIAGNOSIS — Z79899 Other long term (current) drug therapy: Secondary | ICD-10-CM | POA: Insufficient documentation

## 2013-12-25 DIAGNOSIS — E785 Hyperlipidemia, unspecified: Secondary | ICD-10-CM | POA: Insufficient documentation

## 2013-12-25 DIAGNOSIS — Z951 Presence of aortocoronary bypass graft: Secondary | ICD-10-CM | POA: Insufficient documentation

## 2013-12-25 DIAGNOSIS — I10 Essential (primary) hypertension: Secondary | ICD-10-CM | POA: Insufficient documentation

## 2013-12-25 DIAGNOSIS — Z8249 Family history of ischemic heart disease and other diseases of the circulatory system: Secondary | ICD-10-CM | POA: Insufficient documentation

## 2013-12-25 DIAGNOSIS — Z79891 Long term (current) use of opiate analgesic: Secondary | ICD-10-CM | POA: Insufficient documentation

## 2013-12-25 DIAGNOSIS — R079 Chest pain, unspecified: Secondary | ICD-10-CM | POA: Diagnosis present

## 2013-12-25 DIAGNOSIS — Z9861 Coronary angioplasty status: Secondary | ICD-10-CM | POA: Insufficient documentation

## 2013-12-25 DIAGNOSIS — Z6838 Body mass index (BMI) 38.0-38.9, adult: Secondary | ICD-10-CM | POA: Diagnosis not present

## 2013-12-25 DIAGNOSIS — Z955 Presence of coronary angioplasty implant and graft: Secondary | ICD-10-CM | POA: Insufficient documentation

## 2013-12-25 DIAGNOSIS — Z7982 Long term (current) use of aspirin: Secondary | ICD-10-CM | POA: Diagnosis not present

## 2013-12-25 DIAGNOSIS — E669 Obesity, unspecified: Secondary | ICD-10-CM | POA: Diagnosis not present

## 2013-12-25 HISTORY — PX: LEFT HEART CATHETERIZATION WITH CORONARY/GRAFT ANGIOGRAM: SHX5450

## 2013-12-25 SURGERY — LEFT HEART CATHETERIZATION WITH CORONARY/GRAFT ANGIOGRAM
Anesthesia: LOCAL

## 2013-12-25 MED ORDER — SODIUM CHLORIDE 0.9 % IJ SOLN: 3.0000 mL | INTRAMUSCULAR | Status: DC | PRN

## 2013-12-25 MED ORDER — SODIUM CHLORIDE 0.9 % IV SOLN
INTRAVENOUS | Status: DC
Start: 2013-12-25 — End: 2013-12-25
  Administered 2013-12-25: 07:00:00 via INTRAVENOUS

## 2013-12-25 MED ORDER — ASPIRIN 81 MG PO CHEW
81.0000 mg | CHEWABLE_TABLET | ORAL | Status: AC
  Administered 2013-12-25: 81 mg via ORAL

## 2013-12-25 MED ORDER — ACETAMINOPHEN 325 MG PO TABS: 650.0000 mg | ORAL_TABLET | ORAL | Status: DC | PRN

## 2013-12-25 MED ORDER — MIDAZOLAM HCL 2 MG/2ML IJ SOLN
INTRAMUSCULAR | Status: AC
  Filled 2013-12-25: qty 2

## 2013-12-25 MED ORDER — FENTANYL CITRATE 0.05 MG/ML IJ SOLN
INTRAMUSCULAR | Status: AC
  Filled 2013-12-25: qty 2

## 2013-12-25 MED ORDER — ASPIRIN 81 MG PO CHEW
CHEWABLE_TABLET | ORAL | Status: AC
  Administered 2013-12-25: 81 mg via ORAL
  Filled 2013-12-25: qty 1

## 2013-12-25 MED ORDER — NITROGLYCERIN 1 MG/10 ML FOR IR/CATH LAB
INTRA_ARTERIAL | Status: AC
  Filled 2013-12-25: qty 10

## 2013-12-25 MED ORDER — SODIUM CHLORIDE 0.9 % IV SOLN: 1.0000 mL/kg/h | INTRAVENOUS | Status: DC

## 2013-12-25 MED ORDER — SODIUM CHLORIDE 0.9 % IV SOLN: 250.0000 mL | INTRAVENOUS | Status: DC | PRN

## 2013-12-25 MED ORDER — ONDANSETRON HCL 4 MG/2ML IJ SOLN: 4.0000 mg | Freq: Four times a day (QID) | INTRAMUSCULAR | Status: DC | PRN

## 2013-12-25 MED ORDER — SODIUM CHLORIDE 0.9 % IJ SOLN: 3.0000 mL | Freq: Two times a day (BID) | INTRAMUSCULAR | Status: DC

## 2013-12-25 MED ORDER — LIDOCAINE HCL (PF) 1 % IJ SOLN
INTRAMUSCULAR | Status: AC
  Filled 2013-12-25: qty 30

## 2013-12-25 MED ORDER — HEPARIN (PORCINE) IN NACL 2-0.9 UNIT/ML-% IJ SOLN
INTRAMUSCULAR | Status: AC
  Filled 2013-12-25: qty 1000

## 2013-12-25 NOTE — H&P (View-Only) (Signed)
Background: The patient is followed for coronary artery disease. She has undergone CABG in 2001. She subsequently had coronary stenting and Cutting Balloon angioplasty. She has a long Taxus drug-eluting stent it was used to treat in-stent restenosis in the left circumflex. She had been maintained on long-term dual antiplatelet therapy. 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 She has undergone banding. Plavix has been discontinued. Her last echocardiogram was done in 2012 and her left ventricular ejection fraction is 50% with inferolateral and posterior wall hypokinesis. Her most recent heart catheterization from 2013 demonstrating continued patency of her bypass grafts and stent site.  HPI:  63 year old woman presenting for follow-up evaluation. The patient was last seen in January 2015. Recently complains of severe 'indigestion.' She's been taking lots of Tums without much relief. Also has been taking Nexium. Feels like heartburn and choking, even with upper back pain. Has had vomiting associated with this. Sometimes with left arm pain but has 'pinched nerve' and thinks pain may be related to that. Walks on the treadmill regularly but has no symptoms with physical exertion. Symptoms are progressive and patient continues to feel worse - has become very worried about her heart. Has associated shortness of breath and weakness, but no lightheadedness or syncope.  Studies:  Stress Myoview scan 04/04/2011: There is evidence of inferolateral scar with mild peri-infarct ischemia. Gated LVEF 46%.  Outpatient Encounter Prescriptions as of 12/23/2013  Medication Sig  . ALPRAZolam (XANAX) 0.5 MG tablet Take 0.5 mg by mouth 3 (three) times daily as needed for anxiety.   [name] aspirin (ASPIRIN EC) 81 MG EC tablet Take 81 mg by mouth daily. Swallow whole.  [name] atorvastatin (LIPITOR) 20 MG tablet Take 20 mg by mouth daily at 6 PM.   . cetirizine (ZYRTEC) 10 MG tablet Take 10 mg  by mouth daily as needed for allergies.   [name] esomeprazole (NEXIUM) 40 MG capsule Take 40 mg by mouth daily before breakfast.   . ezetimibe (ZETIA) 10 MG tablet Take 10 mg by mouth at bedtime.   . furosemide (LASIX) 40 MG tablet Take 60 mg by mouth daily. Take  1 1/2 tab daily  . isosorbide mononitrate (IMDUR) 30 MG CR tablet Take 30 mg by mouth daily.   [name] NITROSTAT 0.4 MG SL tablet Place 0.4 mg under the tongue every 5 (five) minutes as needed for chest pain.   . Oxycodone HCl 10 MG TABS Take 10 mg by mouth 4 (four) times daily as needed (pain).   . propranolol ER (INDERAL LA) 120 MG 24 hr capsule Take 120 mg by mouth daily.  . valsartan (DIOVAN) 160 MG tablet Take 80-160 mg by mouth 2 (two) times daily. Take one tab in the morning and 1/2 tab at bed time.  . Vitamin D, Ergocalciferol, (DRISDOL) 50000 UNITS CAPS capsule Take 50,000 Units by mouth every 7 (seven) days. On mondays  . [DISCONTINUED] FLUoxetine (PROZAC) 20 MG tablet Take 20 mg by mouth daily.   . [DISCONTINUED] HYDROcodone-acetaminophen (NORCO) 10-325 MG per tablet Take 1 tablet by mouth every 6 (six) hours as needed for moderate pain.   . [DISCONTINUED] metoprolol succinate (TOPROL-XL) 25 MG 24 hr tablet Take 25 mg by mouth daily.      Allergies  Allergen Reactions  . Ace Inhibitors Other (See Comments)  . Cefaclor Itching and Rash  . Cephalexin Itching and Rash  . Penicillins Rash  . Pregabalin Other (See Comments)    Retains fluid  . Tape Itching  and Rash    Takes skin right off with medical tape    Past Medical History  Diagnosis Date  . Rapid palpitations   . Coronary artery disease     Status post percutaneous coronary intervention and bare metal stenting  of the left circumflex and subsequent percutaneous transluminal coronary anioplasty and placement of a 3.0 NIR bare metal stent proximal to the previous stent.  . Hypertension   . Hyperlipidemia   . Obesity   . Gastroesophageal reflux disease   . Depression     . Osteoarthritis   . Status post hysterectomy   . History of pneumonia   . H/O: hysterectomy   . CHF (congestive heart failure)   . Esophageal varices     New 2013  . Cirrhosis of liver     family history includes Coronary artery disease in her sister; Heart attack in her mother; Heart attack (age of onset: 58) in her father. There is no history of Colon cancer.   ROS: Negative except as per HPI  BP 148/86  Ht 5' 2"  (1.575 m)  Wt 212 lb 12.8 oz (96.525 kg)  BMI 38.91 kg/m2  LMP 03/29/2011  PHYSICAL EXAM: Pt is alert and oriented, pleasant obese woman in NAD HEENT: normal Neck: JVP - normal, carotids 2+= without bruits Lungs: CTA bilaterally CV: RRR without murmur or gallop Abd: soft, NT, Positive BS, no hepatomegaly Ext: mild bilateral pretibial edema, distal pulses intact and equal Skin: warm/dry no rash  EKG:  NSR 70 bpm, left IVCD, nonspecific ST-T changes  ASSESSMENT AND PLAN: 1. Coronary artery disease, native vessel, with progressive symptoms concerning for crescendo angina. Differential includes GI versus cardiac etiologies, but concerning that GI therapies have been ineffective. Considering her extensive cardiac history, favor definitive evaluation with cardiac cath and possible PCI. Presence of cirrhosis/varices will have to be considered if PCI necessary. Reviewed recent records and she last underwent banding of varices in August 2015. She's been off of all antiplatelet Rx because of concern regarding bleeding risk.  I have reviewed the risks, indications, and alternatives to cardiac catheterization were reviewed with the patient. Risks include but are not limited to bleeding, infection, vascular injury, stroke, myocardial infection, arrhythmia, kidney injury, radiation-related injury in the case of prolonged fluoroscopy use, emergency cardiac surgery, and death. The patient understands the risks of serious complication is low (<5%).   2. Essential hypertension.  Continue current Rx.   3. Hyperlipidemia. Treated with atorvastatin and Zetia. Followed by PCP.  Sherren Mocha, MD 12/24/2013 11:12 PM

## 2013-12-25 NOTE — CV Procedure (Signed)
    Cardiac Catheterization Procedure Note  Name: Samantha [name] MRN: [id number] DOB: Sep 05, 1950  Procedure: Left Heart Cath, Selective Coronary Angiography, SVG angiography, LV angiography  Indication: Chest pain, crescendo pattern, extensive CAD s/p CABG and PCI   Procedural Details: The left wrist was prepped, draped, and anesthetized with 1% lidocaine. Using the modified Seldinger technique, a 5/6 French Slender sheath was introduced into the left radial artery. 3 mg of verapamil was administered through the sheath, weight-based unfractionated heparin was administered intravenously. Standard Judkins catheters were used for selective coronary angiography, SVG angiography, and left ventriculography. A JR4 catheter was used for both SVG's. Catheter exchanges were performed over an exchange length guidewire. There were no immediate procedural complications. A TR band was used for radial hemostasis at the completion of the procedure.  The patient was transferred to the post catheterization recovery area for further monitoring.  Procedural Findings: Hemodynamics: AO 141/56 LV 154/23  Coronary angiography: Coronary dominance: Left  Left mainstem: The left mainstem is patent with 20% distal left main stenosis. The vessel divides into the LAD and left circumflex  Left anterior descending (LAD): The LAD is patent throughout. The vessel has minor irregularities but there are no significant stenoses identified. A first diagonal branch has 40% ostial narrowing. The LAD reaches the left ventricular apex.  Left circumflex (LCx): The left circumflex is dominant. The vessel is large in caliber. The stented segment in the proximal circumflex is widely patent. There is mild diffuse 30% in-stent restenosis. The most proximal OM branches are occluded. The left posterolateral and PDA branches are patent with mild stenosis present.  Right coronary artery (RCA): Nondominant vessel. The vessel is totally  occluded proximally.  Saphenous vein graft to acute marginal is widely patent with no stenosis.  Saphenous vein graft to the diagonal is widely patent. There are 2 diagonal subbranches that fill. There is a small free RIMA graft arising from the saphenous vein graft. This is a very small caliber vessel that remains patent with its distal anastomosis to a small posterolateral branch circumflex.  Left ventriculography: The basal and midinferior walls are akinetic. The other LV wall segments appear to contract normally, including the LV apex. I would estimate the LVEF at approximately 40-45%.  Estimated Blood Loss: Minimal  Final Conclusions:   1. Two-vessel coronary artery disease with minimal stenosis of the left main and LAD, patent stents in the left circumflex, and total occlusion of a nondominant RCA.  2. Continued patency of the saphenous vein graft to diagonal, saphenous vein graft to acute marginal branch of the RCA, and free RIMA to distal left circumflex  3. Moderate segmental systolic dysfunction  Recommendations: The patient has stable coronary anatomy with continued patency of her grafts and stented segment in the left circumflex. Would continue current management strategy. May need further GI evaluation for her symptoms.  Sherren Mocha MD, [facility] 12/25/2013, 9:19 AM

## 2013-12-25 NOTE — Discharge Instructions (Signed)
Radial Site Care °Refer to this sheet in the next few weeks. These instructions provide you with information on caring for yourself after your procedure. Your caregiver may also give you more specific instructions. Your treatment has been planned according to current medical practices, but problems sometimes occur. Call your caregiver if you have any problems or questions after your procedure. °HOME CARE INSTRUCTIONS °· You may shower the day after the procedure. Remove the bandage (dressing) and gently wash the site with plain soap and water. Gently pat the site dry. °· Do not apply powder or lotion to the site. °· Do not submerge the affected site in water for 3 to 5 days. °· Inspect the site at least twice daily. °· Do not flex or bend the affected arm for 24 hours. °· No lifting over 5 pounds (2.3 kg) for 5 days after your procedure. °· Do not drive home if you are discharged the same day of the procedure. Have someone else drive you. °· You may drive 24 hours after the procedure unless otherwise instructed by your caregiver. °· Do not operate machinery or power tools for 24 hours. °· A responsible adult should be with you for the first 24 hours after you arrive home. °What to expect: °· Any bruising will usually fade within 1 to 2 weeks. °· Blood that collects in the tissue (hematoma) may be painful to the touch. It should usually decrease in size and tenderness within 1 to 2 weeks. °SEEK IMMEDIATE MEDICAL CARE IF: °· You have unusual pain at the radial site. °· You have redness, warmth, swelling, or pain at the radial site. °· You have drainage (other than a small amount of blood on the dressing). °· You have chills. °· You have a fever or persistent symptoms for more than 72 hours. °· You have a fever and your symptoms suddenly get worse. °· Your arm becomes pale, cool, tingly, or numb. °· You have heavy bleeding from the site. Hold pressure on the site and call 911. °Document Released: 03/19/2010 Document  Revised: 05/09/2011 Document Reviewed: 03/19/2010 °ExitCare® Patient Information ©2015 [employer]. This information is not intended to replace advice given to you by your health care provider. Make sure you discuss any questions you have with your health care provider. ° °

## 2013-12-25 NOTE — Progress Notes (Signed)
Plt count of 87 reported to Rennis Harding, RN who states that Dr. Burt Knack made aware yesterday with no new orders received.

## 2013-12-25 NOTE — Interval H&P Note (Signed)
History and Physical Interval Note:  12/25/2013 8:41 AM  Samantha [name]  has presented today for surgery, with the diagnosis of cad/cp  The various methods of treatment have been discussed with the patient and family. After consideration of risks, benefits and other options for treatment, the patient has consented to  Procedure(s): LEFT HEART CATHETERIZATION WITH CORONARY/GRAFT ANGIOGRAM (N/A) as a surgical intervention .  The patient's history has been reviewed, patient examined, no change in status, stable for surgery.  I have reviewed the patient's chart and labs.  Questions were answered to the patient's satisfaction.    Cath Lab Visit (complete for each Cath Lab visit)  Clinical Evaluation Leading to the Procedure:   ACS: No.  Non-ACS:    Anginal Classification: CCS III  Anti-ischemic medical therapy: Maximal Therapy (2 or more classes of medications)  Non-Invasive Test Results: No non-invasive testing performed  Prior CABG: Previous CABG       Sherren Mocha

## 2014-01-02 DIAGNOSIS — M79642 Pain in left hand: Secondary | ICD-10-CM | POA: Diagnosis not present

## 2014-01-02 DIAGNOSIS — M25571 Pain in right ankle and joints of right foot: Secondary | ICD-10-CM | POA: Diagnosis not present

## 2014-01-02 DIAGNOSIS — M25511 Pain in right shoulder: Secondary | ICD-10-CM | POA: Diagnosis not present

## 2014-01-03 DIAGNOSIS — R202 Paresthesia of skin: Secondary | ICD-10-CM | POA: Diagnosis not present

## 2014-01-03 DIAGNOSIS — M79601 Pain in right arm: Secondary | ICD-10-CM | POA: Diagnosis not present

## 2014-02-06 ENCOUNTER — Encounter (HOSPITAL_COMMUNITY): Payer: Self-pay | Admitting: Cardiovascular Disease

## 2014-02-11 DIAGNOSIS — D649 Anemia, unspecified: Secondary | ICD-10-CM | POA: Diagnosis not present

## 2014-02-11 DIAGNOSIS — I10 Essential (primary) hypertension: Secondary | ICD-10-CM | POA: Diagnosis not present

## 2014-02-11 DIAGNOSIS — K210 Gastro-esophageal reflux disease with esophagitis: Secondary | ICD-10-CM | POA: Diagnosis not present

## 2014-02-11 DIAGNOSIS — E780 Pure hypercholesterolemia: Secondary | ICD-10-CM | POA: Diagnosis not present

## 2014-02-11 DIAGNOSIS — E119 Type 2 diabetes mellitus without complications: Secondary | ICD-10-CM | POA: Diagnosis not present

## 2014-02-18 DIAGNOSIS — E780 Pure hypercholesterolemia: Secondary | ICD-10-CM | POA: Diagnosis not present

## 2014-02-18 DIAGNOSIS — Z1389 Encounter for screening for other disorder: Secondary | ICD-10-CM | POA: Diagnosis not present

## 2014-02-18 DIAGNOSIS — I10 Essential (primary) hypertension: Secondary | ICD-10-CM | POA: Diagnosis not present

## 2014-02-18 DIAGNOSIS — D649 Anemia, unspecified: Secondary | ICD-10-CM | POA: Diagnosis not present

## 2014-02-18 DIAGNOSIS — E119 Type 2 diabetes mellitus without complications: Secondary | ICD-10-CM | POA: Diagnosis not present

## 2014-02-18 DIAGNOSIS — R05 Cough: Secondary | ICD-10-CM | POA: Diagnosis not present

## 2014-02-18 DIAGNOSIS — G4709 Other insomnia: Secondary | ICD-10-CM | POA: Diagnosis not present

## 2014-02-18 DIAGNOSIS — F324 Major depressive disorder, single episode, in partial remission: Secondary | ICD-10-CM | POA: Diagnosis not present

## 2014-02-26 ENCOUNTER — Encounter (INDEPENDENT_AMBULATORY_CARE_PROVIDER_SITE_OTHER): Payer: Self-pay | Admitting: *Deleted

## 2014-02-26 DIAGNOSIS — M25571 Pain in right ankle and joints of right foot: Secondary | ICD-10-CM | POA: Diagnosis not present

## 2014-03-04 DIAGNOSIS — R937 Abnormal findings on diagnostic imaging of other parts of musculoskeletal system: Secondary | ICD-10-CM | POA: Diagnosis not present

## 2014-03-04 DIAGNOSIS — Z9689 Presence of other specified functional implants: Secondary | ICD-10-CM | POA: Diagnosis not present

## 2014-03-04 DIAGNOSIS — M19171 Post-traumatic osteoarthritis, right ankle and foot: Secondary | ICD-10-CM | POA: Diagnosis not present

## 2014-03-04 DIAGNOSIS — M25471 Effusion, right ankle: Secondary | ICD-10-CM | POA: Diagnosis not present

## 2014-03-04 DIAGNOSIS — M25571 Pain in right ankle and joints of right foot: Secondary | ICD-10-CM | POA: Diagnosis not present

## 2014-03-04 DIAGNOSIS — M7989 Other specified soft tissue disorders: Secondary | ICD-10-CM | POA: Diagnosis not present

## 2014-03-05 DIAGNOSIS — M19071 Primary osteoarthritis, right ankle and foot: Secondary | ICD-10-CM | POA: Diagnosis not present

## 2014-03-10 ENCOUNTER — Other Ambulatory Visit (INDEPENDENT_AMBULATORY_CARE_PROVIDER_SITE_OTHER): Payer: Self-pay | Admitting: Internal Medicine

## 2014-03-10 DIAGNOSIS — K7469 Other cirrhosis of liver: Secondary | ICD-10-CM

## 2014-03-26 DIAGNOSIS — R319 Hematuria, unspecified: Secondary | ICD-10-CM | POA: Diagnosis not present

## 2014-03-26 DIAGNOSIS — R05 Cough: Secondary | ICD-10-CM | POA: Diagnosis not present

## 2014-03-26 DIAGNOSIS — M19071 Primary osteoarthritis, right ankle and foot: Secondary | ICD-10-CM | POA: Diagnosis not present

## 2014-04-10 ENCOUNTER — Ambulatory Visit (HOSPITAL_COMMUNITY)
Admission: RE | Admit: 2014-04-10 | Discharge: 2014-04-10 | Disposition: A | Discharge status: Home / Self Care | Payer: Medicare Other | Source: Ambulatory Visit | Attending: Internal Medicine | Admitting: Internal Medicine

## 2014-04-10 DIAGNOSIS — K7460 Unspecified cirrhosis of liver: Secondary | ICD-10-CM | POA: Diagnosis not present

## 2014-04-10 DIAGNOSIS — R161 Splenomegaly, not elsewhere classified: Secondary | ICD-10-CM | POA: Diagnosis not present

## 2014-04-10 DIAGNOSIS — K7469 Other cirrhosis of liver: Secondary | ICD-10-CM

## 2014-04-28 ENCOUNTER — Ambulatory Visit (INDEPENDENT_AMBULATORY_CARE_PROVIDER_SITE_OTHER): Payer: Medicare Other | Admitting: Internal Medicine

## 2014-04-28 ENCOUNTER — Encounter (INDEPENDENT_AMBULATORY_CARE_PROVIDER_SITE_OTHER): Payer: Self-pay | Admitting: Internal Medicine

## 2014-04-28 VITALS — BP 132/62 | HR 76 | Temp 97.9°F | Ht 62.0 in | Wt 217.0 lb

## 2014-04-28 DIAGNOSIS — K760 Fatty (change of) liver, not elsewhere classified: Secondary | ICD-10-CM

## 2014-04-28 LAB — HEPATIC FUNCTION PANEL
ALT: 31 U/L (ref 0–35)
AST: 45 U/L — ABNORMAL HIGH (ref 0–37)
Albumin: 4.1 g/dL (ref 3.5–5.2)
Alkaline Phosphatase: 106 U/L (ref 39–117)
Bilirubin, Direct: 0.1 mg/dL (ref 0.0–0.3)
Indirect Bilirubin: 0.4 mg/dL (ref 0.2–1.2)
Total Bilirubin: 0.5 mg/dL (ref 0.2–1.2)
Total Protein: 7.3 g/dL (ref 6.0–8.3)

## 2014-04-28 NOTE — Progress Notes (Addendum)
Subjective:    Patient ID: Samantha [name], female    DOB: 10/24/1950, 64 y.o.   MRN: [id number]  HPI Here today for f/u of her cirrhosis secondary to NAFLD complicated by esophageal varices. Last banding was 10/22/2013 by Dr .Laural Golden. She tells me she is doing good. She tells me she been having blood in her urine x 1 month. Dr. Quintin Alto is going to refer her to a Urologist in [place] for this.  Her appetite is good. She has gained 2 pounds since her last visit.  She has a BM daily. No melena or BRRB. She tells me she had a virus over this past weekend but now has resolved. No hematemesis.  Really has not been exercising due to the weather.    Procedure Date: 10/22/2013  Procedure: EGD with esophageal variceal banding.  Indications: Patient is 64 year old Caucasian female with cirrhosis secondary to NAFLD complicated by largest the patient bases which were banded twice last year for primary prophylaxis. She is returning for reevaluation.  Impression: Single large esophageal varix was banded. Focal esophageal scarring from previous banding sessions. Mild portal gastropathy. Single small fundal varix.   04/10/2014 US abdomen: MPRESSION: 1. Findings suggesting cirrhosis again noted. 2. Splenomegaly. Splenomegaly has decreased slightly from prior  Hepatic Function Panel     Component Value Date/Time   PROT 7.4 09/17/2013 1521   ALBUMIN 4.2 09/17/2013 1521   AST 33 09/17/2013 1521   ALT 13 09/17/2013 1521   ALKPHOS 83 09/17/2013 1521   BILITOT 0.6 09/17/2013 1521   BILIDIR 0.1 09/17/2013 1521   IBILI 0.5 09/17/2013 1521   CBC    Component Value Date/Time   WBC 4.6 12/23/2013 1028   RBC 5.05 12/23/2013 1028   HGB 12.2 12/23/2013 1028   HCT 38.8 12/23/2013 1028   PLT 87.0* 12/23/2013 1028   MCV 77.0* 12/23/2013 1028   MCH 23.8* 03/20/2013 1552   MCHC 31.4  12/23/2013 1028   RDW 22.3* 12/23/2013 1028   LYMPHSABS 1.8 03/20/2013 1552   MONOABS 0.6 03/20/2013 1552   EOSABS 0.2 03/20/2013 1552   BASOSABS 0.0 03/20/2013 1552        Review of Systems Past Medical History  Diagnosis Date  . Rapid palpitations   . Coronary artery disease     Status post percutaneous coronary intervention and bare metal stenting  of the left circumflex and subsequent percutaneous transluminal coronary anioplasty and placement of a 3.0 NIR bare metal stent proximal to the previous stent.  . Hypertension   . Hyperlipidemia   . Obesity   . Gastroesophageal reflux disease   . Depression   . Osteoarthritis   . Status post hysterectomy   . History of pneumonia   . H/O: hysterectomy   . CHF (congestive heart failure)   . Esophageal varices     New 2013  . Cirrhosis of liver     Past Surgical History  Procedure Laterality Date  . Coronary artery bypass graft  May 31,2001    x 3 with a vein graft to the first diagonal, vein graft to the right coronary  artery, and a free left internal mammary  artery to the obtuse marginal   . Cardiac catheterization  2004    left internal mammary artery to the obtuse marginal  was found to be small and thread like.  The two grafts were patent.  The left circumflex had 90% in-stent restenosis and cutting balloon angioplasty was performed followed by placement of a 3.0 x  35mm Taxus drug -eluting stent.    . Cardiac catheterization  2006    There was in-stent restenosis in the left circumflex and this was treated with cutting balloon angioplasty   . Cardiac catheterization  2008    vein graft to the to the obtuse marginal was patent, although small, left circumflex had 40% in-stent restenosis, ejection fraction 40-45%.  The patient was medically mananged.  . Tonsillectomy    . Joint replacement    . Rotator cuff left  2007  . Back surgery    . Abdominal hysterectomy    . Colonoscopy  03/29/2011    Procedure: COLONOSCOPY;   Surgeon: Jamesetta So, MD;  Location: [facility];  Service: Gastroenterology;  Laterality: N/A;  . Esophagogastroduodenoscopy  12/20/2011    Procedure: ESOPHAGOGASTRODUODENOSCOPY (EGD);  Surgeon: Jamesetta So, MD;  Location: [facility];  Service: Gastroenterology;  Laterality: N/A;  . Esophagogastroduodenoscopy N/A 07/04/2012    Procedure: ESOPHAGOGASTRODUODENOSCOPY (EGD);  Surgeon: Rogene Houston, MD;  Location: [facility];  Service: Endoscopy;  Laterality: N/A;  235-moved to 255 Ann to notify pt  . Esophageal banding N/A 07/04/2012    Procedure: ESOPHAGEAL BANDING;  Surgeon: Rogene Houston, MD;  Location: [facility];  Service: Endoscopy;  Laterality: N/A;  . Esophagogastroduodenoscopy N/A 09/17/2012    Procedure: ESOPHAGOGASTRODUODENOSCOPY (EGD);  Surgeon: Rogene Houston, MD;  Location: [facility];  Service: Endoscopy;  Laterality: N/A;  730  . Esophageal banding N/A 09/17/2012    Procedure: ESOPHAGEAL BANDING;  Surgeon: Rogene Houston, MD;  Location: [facility];  Service: Endoscopy;  Laterality: N/A;  . Esophagogastroduodenoscopy N/A 10/22/2013    Procedure: ESOPHAGOGASTRODUODENOSCOPY (EGD);  Surgeon: Rogene Houston, MD;  Location: [facility];  Service: Endoscopy;  Laterality: N/A;  730  . Esophageal banding N/A 10/22/2013    Procedure: ESOPHAGEAL BANDING;  Surgeon: Rogene Houston, MD;  Location: [facility];  Service: Endoscopy;  Laterality: N/A;  . Left heart catheterization with coronary/graft angiogram N/A 12/25/2013    Procedure: LEFT HEART CATHETERIZATION WITH Beatrix Fetters;  Surgeon: Blane Ohara, MD;  Location: [facility] CATH [facility];  Service: Cardiovascular;  Laterality: N/A;    Allergies  Allergen Reactions  . Ace Inhibitors Other (See Comments)  . Cefaclor Itching and Rash  . Cephalexin Itching and Rash  . Penicillins Rash  . Pregabalin Other (See Comments)    Retains fluid  . Tape Itching and Rash    Takes skin right off with medical tape     Current Outpatient Prescriptions on File Prior to Visit  Medication Sig Dispense Refill  . albuterol (PROVENTIL HFA;VENTOLIN HFA) 108 (90 BASE) MCG/ACT inhaler Inhale 1-2 puffs into the lungs every 6 (six) hours as needed for wheezing or shortness of breath.    . ALPRAZolam (XANAX) 0.5 MG tablet Take 0.5 mg by mouth 3 (three) times daily as needed for anxiety.     [name] aspirin (ASPIRIN EC) 81 MG EC tablet Take 81 mg by mouth daily. Swallow whole.    [name] atorvastatin (LIPITOR) 20 MG tablet Take 20 mg by mouth daily at 6 PM.     . cetirizine (ZYRTEC) 10 MG tablet Take 10 mg by mouth daily as needed for allergies.     [name] esomeprazole (NEXIUM) 40 MG capsule Take 40 mg by mouth daily before breakfast.     . ezetimibe (ZETIA) 10 MG tablet Take 10 mg by mouth at bedtime.     [name] FLUoxetine (PROZAC)  40 MG capsule Take 40 mg by mouth daily.    . furosemide (LASIX) 40 MG tablet Take 60 mg by mouth daily. Take  1 1/2 tab daily    . isosorbide mononitrate (IMDUR) 30 MG CR tablet Take 30 mg by mouth daily.     [name] NITROSTAT 0.4 MG SL tablet Place 0.4 mg under the tongue every 5 (five) minutes as needed for chest pain.     . Oxycodone HCl 10 MG TABS Take 10 mg by mouth 4 (four) times daily as needed (pain).     . propranolol ER (INDERAL LA) 120 MG 24 hr capsule Take 120 mg by mouth daily.    . valsartan (DIOVAN) 160 MG tablet Take 80-160 mg by mouth 2 (two) times daily. Take one tab in the morning and 1/2 tab at bed time.     No current facility-administered medications on file prior to visit.        Objective:   Physical Exam  Filed Vitals:   04/28/14 0859  Height: 5\' 2"  (1.575 m)  Weight: 217 lb (98.431 kg)   Alert and oriented. Skin warm and dry. Oral mucosa is moist.   . Sclera anicteric, conjunctivae is pink. Thyroid not enlarged. No cervical lymphadenopathy. Lungs clear. Heart regular rate and rhythm.  Abdomen is soft. Bowel sounds are positive. No hepatomegaly. No abdominal masses felt. No  tenderness.  No edema to lower extremities.          Assessment & Plan:  NAFLD complicated by esophageal varices. Last banding in August of 2015. Plan: Hepatic function, CBC today. OV in 6 months. Continue to exercise.

## 2014-04-28 NOTE — Patient Instructions (Addendum)
OV in 6 months. CBC, Hepatic function. Needs to diet and exercised.

## 2014-04-29 LAB — CBC WITH DIFFERENTIAL/PLATELET
Basophils Absolute: 0 10*3/uL (ref 0.0–0.1)
Basophils Relative: 0 % (ref 0–1)
Eosinophils Absolute: 0.2 10*3/uL (ref 0.0–0.7)
Eosinophils Relative: 3 % (ref 0–5)
HCT: 45.4 % (ref 36.0–46.0)
Hemoglobin: 14.6 g/dL (ref 12.0–15.0)
Lymphocytes Relative: 28 % (ref 12–46)
Lymphs Abs: 1.4 10*3/uL (ref 0.7–4.0)
MCH: 26.6 pg (ref 26.0–34.0)
MCHC: 32.2 g/dL (ref 30.0–36.0)
MCV: 82.7 fL (ref 78.0–100.0)
MPV: 11 fL (ref 8.6–12.4)
Monocytes Absolute: 0.5 10*3/uL (ref 0.1–1.0)
Monocytes Relative: 10 % (ref 3–12)
Neutro Abs: 3 10*3/uL (ref 1.7–7.7)
Neutrophils Relative %: 59 % (ref 43–77)
Platelets: 92 10*3/uL — ABNORMAL LOW (ref 150–400)
RBC: 5.49 MIL/uL — ABNORMAL HIGH (ref 3.87–5.11)
RDW: 17.4 % — ABNORMAL HIGH (ref 11.5–15.5)
WBC: 5 10*3/uL (ref 4.0–10.5)

## 2014-04-30 ENCOUNTER — Telehealth (INDEPENDENT_AMBULATORY_CARE_PROVIDER_SITE_OTHER): Payer: Self-pay | Admitting: *Deleted

## 2014-04-30 DIAGNOSIS — K7469 Other cirrhosis of liver: Secondary | ICD-10-CM

## 2014-04-30 DIAGNOSIS — K760 Fatty (change of) liver, not elsewhere classified: Secondary | ICD-10-CM

## 2014-04-30 NOTE — Telephone Encounter (Signed)
.  Per Lelon Perla patient to have lab work drawn in 6 months.

## 2014-05-07 DIAGNOSIS — M549 Dorsalgia, unspecified: Secondary | ICD-10-CM | POA: Diagnosis not present

## 2014-05-07 DIAGNOSIS — R319 Hematuria, unspecified: Secondary | ICD-10-CM | POA: Diagnosis not present

## 2014-05-07 DIAGNOSIS — K7460 Unspecified cirrhosis of liver: Secondary | ICD-10-CM | POA: Diagnosis not present

## 2014-05-07 DIAGNOSIS — I252 Old myocardial infarction: Secondary | ICD-10-CM | POA: Diagnosis not present

## 2014-05-07 DIAGNOSIS — I10 Essential (primary) hypertension: Secondary | ICD-10-CM | POA: Diagnosis not present

## 2014-05-07 DIAGNOSIS — F418 Other specified anxiety disorders: Secondary | ICD-10-CM | POA: Diagnosis not present

## 2014-05-07 DIAGNOSIS — R161 Splenomegaly, not elsewhere classified: Secondary | ICD-10-CM | POA: Diagnosis not present

## 2014-05-07 DIAGNOSIS — Z90710 Acquired absence of both cervix and uterus: Secondary | ICD-10-CM | POA: Diagnosis not present

## 2014-05-07 DIAGNOSIS — M13852 Other specified arthritis, left hip: Secondary | ICD-10-CM | POA: Diagnosis not present

## 2014-05-07 DIAGNOSIS — M722 Plantar fascial fibromatosis: Secondary | ICD-10-CM | POA: Diagnosis not present

## 2014-05-07 DIAGNOSIS — K7469 Other cirrhosis of liver: Secondary | ICD-10-CM | POA: Diagnosis not present

## 2014-05-07 DIAGNOSIS — K219 Gastro-esophageal reflux disease without esophagitis: Secondary | ICD-10-CM | POA: Diagnosis not present

## 2014-05-07 DIAGNOSIS — Z951 Presence of aortocoronary bypass graft: Secondary | ICD-10-CM | POA: Diagnosis not present

## 2014-05-07 DIAGNOSIS — N390 Urinary tract infection, site not specified: Secondary | ICD-10-CM | POA: Diagnosis not present

## 2014-05-12 DIAGNOSIS — N390 Urinary tract infection, site not specified: Secondary | ICD-10-CM | POA: Diagnosis not present

## 2014-05-12 DIAGNOSIS — R310 Gross hematuria: Secondary | ICD-10-CM | POA: Diagnosis not present

## 2014-05-14 DIAGNOSIS — R310 Gross hematuria: Secondary | ICD-10-CM | POA: Diagnosis not present

## 2014-05-14 DIAGNOSIS — Z90710 Acquired absence of both cervix and uterus: Secondary | ICD-10-CM | POA: Diagnosis not present

## 2014-05-14 DIAGNOSIS — K766 Portal hypertension: Secondary | ICD-10-CM | POA: Diagnosis not present

## 2014-05-14 DIAGNOSIS — K7469 Other cirrhosis of liver: Secondary | ICD-10-CM | POA: Diagnosis not present

## 2014-05-14 DIAGNOSIS — K7460 Unspecified cirrhosis of liver: Secondary | ICD-10-CM | POA: Diagnosis not present

## 2014-06-03 DIAGNOSIS — N329 Bladder disorder, unspecified: Secondary | ICD-10-CM | POA: Diagnosis not present

## 2014-06-03 DIAGNOSIS — R310 Gross hematuria: Secondary | ICD-10-CM | POA: Diagnosis not present

## 2014-06-05 DIAGNOSIS — E119 Type 2 diabetes mellitus without complications: Secondary | ICD-10-CM | POA: Diagnosis not present

## 2014-06-05 DIAGNOSIS — I10 Essential (primary) hypertension: Secondary | ICD-10-CM | POA: Diagnosis not present

## 2014-06-05 DIAGNOSIS — E780 Pure hypercholesterolemia: Secondary | ICD-10-CM | POA: Diagnosis not present

## 2014-06-05 DIAGNOSIS — D649 Anemia, unspecified: Secondary | ICD-10-CM | POA: Diagnosis not present

## 2014-06-05 DIAGNOSIS — K210 Gastro-esophageal reflux disease with esophagitis: Secondary | ICD-10-CM | POA: Diagnosis not present

## 2014-06-12 DIAGNOSIS — D696 Thrombocytopenia, unspecified: Secondary | ICD-10-CM | POA: Diagnosis not present

## 2014-06-12 DIAGNOSIS — E780 Pure hypercholesterolemia: Secondary | ICD-10-CM | POA: Diagnosis not present

## 2014-06-12 DIAGNOSIS — F324 Major depressive disorder, single episode, in partial remission: Secondary | ICD-10-CM | POA: Diagnosis not present

## 2014-06-12 DIAGNOSIS — E119 Type 2 diabetes mellitus without complications: Secondary | ICD-10-CM | POA: Diagnosis not present

## 2014-06-12 DIAGNOSIS — D649 Anemia, unspecified: Secondary | ICD-10-CM | POA: Diagnosis not present

## 2014-06-12 DIAGNOSIS — Z23 Encounter for immunization: Secondary | ICD-10-CM | POA: Diagnosis not present

## 2014-06-12 DIAGNOSIS — G4709 Other insomnia: Secondary | ICD-10-CM | POA: Diagnosis not present

## 2014-06-12 DIAGNOSIS — I10 Essential (primary) hypertension: Secondary | ICD-10-CM | POA: Diagnosis not present

## 2014-06-13 DIAGNOSIS — Z1231 Encounter for screening mammogram for malignant neoplasm of breast: Secondary | ICD-10-CM | POA: Diagnosis not present

## 2014-06-18 DIAGNOSIS — Z833 Family history of diabetes mellitus: Secondary | ICD-10-CM | POA: Diagnosis not present

## 2014-06-18 DIAGNOSIS — I252 Old myocardial infarction: Secondary | ICD-10-CM | POA: Diagnosis not present

## 2014-06-18 DIAGNOSIS — K7460 Unspecified cirrhosis of liver: Secondary | ICD-10-CM | POA: Diagnosis not present

## 2014-06-18 DIAGNOSIS — Z6841 Body Mass Index (BMI) 40.0 and over, adult: Secondary | ICD-10-CM | POA: Diagnosis not present

## 2014-06-18 DIAGNOSIS — Z883 Allergy status to other anti-infective agents status: Secondary | ICD-10-CM | POA: Diagnosis not present

## 2014-06-18 DIAGNOSIS — Z79899 Other long term (current) drug therapy: Secondary | ICD-10-CM | POA: Diagnosis not present

## 2014-06-18 DIAGNOSIS — Z951 Presence of aortocoronary bypass graft: Secondary | ICD-10-CM | POA: Diagnosis not present

## 2014-06-18 DIAGNOSIS — I10 Essential (primary) hypertension: Secondary | ICD-10-CM | POA: Diagnosis not present

## 2014-06-18 DIAGNOSIS — Z8249 Family history of ischemic heart disease and other diseases of the circulatory system: Secondary | ICD-10-CM | POA: Diagnosis not present

## 2014-06-18 DIAGNOSIS — N329 Bladder disorder, unspecified: Secondary | ICD-10-CM | POA: Diagnosis not present

## 2014-06-18 DIAGNOSIS — Z87891 Personal history of nicotine dependence: Secondary | ICD-10-CM | POA: Diagnosis not present

## 2014-06-18 DIAGNOSIS — J45909 Unspecified asthma, uncomplicated: Secondary | ICD-10-CM | POA: Diagnosis not present

## 2014-06-18 DIAGNOSIS — E785 Hyperlipidemia, unspecified: Secondary | ICD-10-CM | POA: Diagnosis not present

## 2014-06-18 DIAGNOSIS — Z7982 Long term (current) use of aspirin: Secondary | ICD-10-CM | POA: Diagnosis not present

## 2014-06-18 DIAGNOSIS — R310 Gross hematuria: Secondary | ICD-10-CM | POA: Diagnosis not present

## 2014-06-18 DIAGNOSIS — K219 Gastro-esophageal reflux disease without esophagitis: Secondary | ICD-10-CM | POA: Diagnosis not present

## 2014-06-18 DIAGNOSIS — Z96649 Presence of unspecified artificial hip joint: Secondary | ICD-10-CM | POA: Diagnosis not present

## 2014-06-18 DIAGNOSIS — F419 Anxiety disorder, unspecified: Secondary | ICD-10-CM | POA: Diagnosis not present

## 2014-06-18 DIAGNOSIS — Z87440 Personal history of urinary (tract) infections: Secondary | ICD-10-CM | POA: Diagnosis not present

## 2014-06-18 DIAGNOSIS — F329 Major depressive disorder, single episode, unspecified: Secondary | ICD-10-CM | POA: Diagnosis not present

## 2014-06-18 DIAGNOSIS — E669 Obesity, unspecified: Secondary | ICD-10-CM | POA: Diagnosis not present

## 2014-06-18 DIAGNOSIS — Z880 Allergy status to penicillin: Secondary | ICD-10-CM | POA: Diagnosis not present

## 2014-06-24 DIAGNOSIS — C459 Mesothelioma, unspecified: Secondary | ICD-10-CM | POA: Diagnosis not present

## 2014-06-27 DIAGNOSIS — N329 Bladder disorder, unspecified: Secondary | ICD-10-CM | POA: Diagnosis not present

## 2014-06-27 DIAGNOSIS — R310 Gross hematuria: Secondary | ICD-10-CM | POA: Diagnosis not present

## 2014-06-27 DIAGNOSIS — N390 Urinary tract infection, site not specified: Secondary | ICD-10-CM | POA: Diagnosis not present

## 2014-08-22 DIAGNOSIS — M545 Low back pain: Secondary | ICD-10-CM | POA: Diagnosis not present

## 2014-08-25 ENCOUNTER — Other Ambulatory Visit: Payer: Self-pay

## 2014-09-18 ENCOUNTER — Encounter (INDEPENDENT_AMBULATORY_CARE_PROVIDER_SITE_OTHER): Payer: Self-pay | Admitting: *Deleted

## 2014-09-30 DIAGNOSIS — C672 Malignant neoplasm of lateral wall of bladder: Secondary | ICD-10-CM | POA: Diagnosis not present

## 2014-10-01 DIAGNOSIS — C672 Malignant neoplasm of lateral wall of bladder: Secondary | ICD-10-CM | POA: Diagnosis not present

## 2014-10-03 DIAGNOSIS — D649 Anemia, unspecified: Secondary | ICD-10-CM | POA: Diagnosis not present

## 2014-10-03 DIAGNOSIS — R319 Hematuria, unspecified: Secondary | ICD-10-CM | POA: Diagnosis not present

## 2014-10-03 DIAGNOSIS — E780 Pure hypercholesterolemia: Secondary | ICD-10-CM | POA: Diagnosis not present

## 2014-10-03 DIAGNOSIS — I10 Essential (primary) hypertension: Secondary | ICD-10-CM | POA: Diagnosis not present

## 2014-10-03 DIAGNOSIS — K210 Gastro-esophageal reflux disease with esophagitis: Secondary | ICD-10-CM | POA: Diagnosis not present

## 2014-10-03 DIAGNOSIS — E119 Type 2 diabetes mellitus without complications: Secondary | ICD-10-CM | POA: Diagnosis not present

## 2014-10-06 ENCOUNTER — Encounter (INDEPENDENT_AMBULATORY_CARE_PROVIDER_SITE_OTHER): Payer: Self-pay | Admitting: *Deleted

## 2014-10-09 ENCOUNTER — Other Ambulatory Visit (INDEPENDENT_AMBULATORY_CARE_PROVIDER_SITE_OTHER): Payer: Self-pay | Admitting: Internal Medicine

## 2014-10-09 DIAGNOSIS — K7469 Other cirrhosis of liver: Secondary | ICD-10-CM

## 2014-10-10 DIAGNOSIS — F3342 Major depressive disorder, recurrent, in full remission: Secondary | ICD-10-CM | POA: Diagnosis not present

## 2014-10-10 DIAGNOSIS — G4709 Other insomnia: Secondary | ICD-10-CM | POA: Diagnosis not present

## 2014-10-10 DIAGNOSIS — E119 Type 2 diabetes mellitus without complications: Secondary | ICD-10-CM | POA: Diagnosis not present

## 2014-10-10 DIAGNOSIS — I10 Essential (primary) hypertension: Secondary | ICD-10-CM | POA: Diagnosis not present

## 2014-10-10 DIAGNOSIS — E780 Pure hypercholesterolemia: Secondary | ICD-10-CM | POA: Diagnosis not present

## 2014-10-10 DIAGNOSIS — J209 Acute bronchitis, unspecified: Secondary | ICD-10-CM | POA: Diagnosis not present

## 2014-10-10 DIAGNOSIS — D649 Anemia, unspecified: Secondary | ICD-10-CM | POA: Diagnosis not present

## 2014-10-10 DIAGNOSIS — D696 Thrombocytopenia, unspecified: Secondary | ICD-10-CM | POA: Diagnosis not present

## 2014-10-20 ENCOUNTER — Ambulatory Visit (HOSPITAL_COMMUNITY)
Admission: RE | Admit: 2014-10-20 | Discharge: 2014-10-20 | Disposition: A | Discharge status: Home / Self Care | Payer: Medicare Other | Source: Ambulatory Visit | Attending: Internal Medicine | Admitting: Internal Medicine

## 2014-10-20 DIAGNOSIS — I10 Essential (primary) hypertension: Secondary | ICD-10-CM | POA: Insufficient documentation

## 2014-10-20 DIAGNOSIS — R161 Splenomegaly, not elsewhere classified: Secondary | ICD-10-CM | POA: Diagnosis not present

## 2014-10-20 DIAGNOSIS — K7469 Other cirrhosis of liver: Secondary | ICD-10-CM

## 2014-10-20 DIAGNOSIS — K7460 Unspecified cirrhosis of liver: Secondary | ICD-10-CM | POA: Diagnosis not present

## 2014-10-20 DIAGNOSIS — K828 Other specified diseases of gallbladder: Secondary | ICD-10-CM | POA: Diagnosis not present

## 2014-10-20 DIAGNOSIS — R932 Abnormal findings on diagnostic imaging of liver and biliary tract: Secondary | ICD-10-CM | POA: Insufficient documentation

## 2014-10-20 DIAGNOSIS — K7689 Other specified diseases of liver: Secondary | ICD-10-CM | POA: Diagnosis not present

## 2014-10-21 ENCOUNTER — Encounter (INDEPENDENT_AMBULATORY_CARE_PROVIDER_SITE_OTHER): Payer: Self-pay | Admitting: *Deleted

## 2014-10-21 ENCOUNTER — Other Ambulatory Visit (INDEPENDENT_AMBULATORY_CARE_PROVIDER_SITE_OTHER): Payer: Self-pay | Admitting: *Deleted

## 2014-10-21 DIAGNOSIS — K7469 Other cirrhosis of liver: Secondary | ICD-10-CM

## 2014-10-21 DIAGNOSIS — K760 Fatty (change of) liver, not elsewhere classified: Secondary | ICD-10-CM

## 2014-10-22 ENCOUNTER — Ambulatory Visit (INDEPENDENT_AMBULATORY_CARE_PROVIDER_SITE_OTHER): Payer: Medicare Other | Admitting: Internal Medicine

## 2014-10-27 ENCOUNTER — Ambulatory Visit (INDEPENDENT_AMBULATORY_CARE_PROVIDER_SITE_OTHER): Payer: Medicare Other | Admitting: Internal Medicine

## 2014-10-27 DIAGNOSIS — J209 Acute bronchitis, unspecified: Secondary | ICD-10-CM | POA: Diagnosis not present

## 2014-10-27 DIAGNOSIS — R05 Cough: Secondary | ICD-10-CM | POA: Diagnosis not present

## 2014-10-29 ENCOUNTER — Ambulatory Visit (INDEPENDENT_AMBULATORY_CARE_PROVIDER_SITE_OTHER): Payer: Medicare Other | Admitting: Internal Medicine

## 2014-10-29 ENCOUNTER — Encounter (INDEPENDENT_AMBULATORY_CARE_PROVIDER_SITE_OTHER): Payer: Self-pay | Admitting: *Deleted

## 2014-10-29 ENCOUNTER — Other Ambulatory Visit (INDEPENDENT_AMBULATORY_CARE_PROVIDER_SITE_OTHER): Payer: Self-pay | Admitting: Internal Medicine

## 2014-10-29 ENCOUNTER — Encounter (INDEPENDENT_AMBULATORY_CARE_PROVIDER_SITE_OTHER): Payer: Self-pay | Admitting: Internal Medicine

## 2014-10-29 ENCOUNTER — Telehealth (INDEPENDENT_AMBULATORY_CARE_PROVIDER_SITE_OTHER): Payer: Self-pay | Admitting: *Deleted

## 2014-10-29 VITALS — BP 142/74 | HR 60 | Temp 97.4°F | Ht 62.0 in | Wt 223.5 lb

## 2014-10-29 DIAGNOSIS — I8500 Esophageal varices without bleeding: Secondary | ICD-10-CM

## 2014-10-29 DIAGNOSIS — K760 Fatty (change of) liver, not elsewhere classified: Secondary | ICD-10-CM | POA: Diagnosis not present

## 2014-10-29 NOTE — Patient Instructions (Signed)
EGD/ with possible banding. The risks and benefits such as perforation, bleeding, and infection were reviewed with the patient and is agreeable.

## 2014-10-29 NOTE — Progress Notes (Signed)
   Subjective:    Patient ID: Samantha [name], female    DOB: [birth year], 65 y.o.   MRN: [id number]  HPI Here today for f/u of her NAFLD complicated by esophageal varices. She was last seen in February. Her last banding was in August of 2015 by Dr. Laural Golden.  She tells me she is doing. She says she has some tenderness ruq after she underwent an [place] one week ago.  Appetite is good. She has gained 7 pounds since her last visit. No melena or BRRB. No lower abdominal pain.  Hx of NAFLD for about 11 years. Presently taking Levaquin for bronchitis.   10/04/2014 H and H 11.6 and 38.7, MCV 81, Platelet ct 90. Total bili 0.5, ALP 93, AST 44, ALT 20, Cholesterol 134, Triglycerides 144, HDL 42.    10/20/2014 US abdomen: Cirrhosis: IMPRESSION: 1. Grossly stable appearance of the liver with contour irregularity and parenchymal heterogeneity consistent with cirrhosis. No focal lesions identified. 2. Mildly progressive splenomegaly, likely due to portal hypertension. 3. Mild nonspecific gallbladder wall thickening, likely secondary to liver disease. No evidence of biliary dilatation.    Procedure Date: 10/22/2013  Procedure: EGD with esophageal variceal banding.  Indications: Patient is 64 year old Caucasian female with cirrhosis secondary to NAFLD complicated by largest the patient bases which were banded twice last year for primary prophylaxis. She is returning for reevaluation.  Impression: Single large esophageal varix was banded. Focal esophageal scarring from previous banding sessions. Mild portal gastropathy.    04/10/2014 US abdomen: MPRESSION: 1. Findings suggesting cirrhosis again noted. 2. Splenomegaly. Splenomegaly has decreased slightly from prior  Review of Systems     Objective:   Physical ExamBlood pressure 142/74, pulse 60, temperature 97.4 F (36.3  C), height 5\' 2"  (1.575 m), weight 223 lb 8 oz (101.379 kg), last menstrual period 03/29/2011. Alert and oriented. Skin warm and dry. Oral mucosa is moist.   . Sclera anicteric, conjunctivae is pink. Thyroid not enlarged. No cervical lymphadenopathy. Faint,bilateral wheezes Heart regular rate and rhythm.  Abdomen is soft. Bowel sounds are positive. No hepatomegaly. No abdominal masses felt. No tenderness.  No edema to lower extremities. Patient is alert and oriented.        Assessment & Plan:   EGD/with possible banding. The risks and benefits such as perforation, bleeding, and infection were reviewed with the patient and is agreeable. OV in 6 months.

## 2014-10-29 NOTE — Telephone Encounter (Signed)
.  Per Lelon Perla patient is to have labs in 6 months.

## 2014-11-10 ENCOUNTER — Encounter (INDEPENDENT_AMBULATORY_CARE_PROVIDER_SITE_OTHER): Payer: Self-pay

## 2014-11-10 DIAGNOSIS — R05 Cough: Secondary | ICD-10-CM | POA: Diagnosis not present

## 2014-11-28 ENCOUNTER — Encounter (HOSPITAL_COMMUNITY): Payer: Self-pay | Admitting: *Deleted

## 2014-11-28 ENCOUNTER — Encounter (HOSPITAL_COMMUNITY)
Admission: RE | Disposition: A | Discharge status: Home / Self Care | Payer: Self-pay | Source: Ambulatory Visit | Attending: Internal Medicine

## 2014-11-28 ENCOUNTER — Ambulatory Visit (HOSPITAL_COMMUNITY)
Admission: RE | Admit: 2014-11-28 | Discharge: 2014-11-28 | Disposition: A | Discharge status: Home / Self Care | Payer: Medicare Other | Source: Ambulatory Visit | Attending: Internal Medicine | Admitting: Internal Medicine

## 2014-11-28 DIAGNOSIS — K219 Gastro-esophageal reflux disease without esophagitis: Secondary | ICD-10-CM | POA: Insufficient documentation

## 2014-11-28 DIAGNOSIS — K7460 Unspecified cirrhosis of liver: Secondary | ICD-10-CM | POA: Diagnosis not present

## 2014-11-28 DIAGNOSIS — I10 Essential (primary) hypertension: Secondary | ICD-10-CM | POA: Insufficient documentation

## 2014-11-28 DIAGNOSIS — Z79899 Other long term (current) drug therapy: Secondary | ICD-10-CM | POA: Insufficient documentation

## 2014-11-28 DIAGNOSIS — I8500 Esophageal varices without bleeding: Secondary | ICD-10-CM | POA: Diagnosis not present

## 2014-11-28 DIAGNOSIS — E669 Obesity, unspecified: Secondary | ICD-10-CM | POA: Insufficient documentation

## 2014-11-28 DIAGNOSIS — Z7982 Long term (current) use of aspirin: Secondary | ICD-10-CM | POA: Insufficient documentation

## 2014-11-28 DIAGNOSIS — K3189 Other diseases of stomach and duodenum: Secondary | ICD-10-CM | POA: Insufficient documentation

## 2014-11-28 DIAGNOSIS — E785 Hyperlipidemia, unspecified: Secondary | ICD-10-CM | POA: Diagnosis not present

## 2014-11-28 DIAGNOSIS — K760 Fatty (change of) liver, not elsewhere classified: Secondary | ICD-10-CM | POA: Diagnosis not present

## 2014-11-28 DIAGNOSIS — Z6839 Body mass index (BMI) 39.0-39.9, adult: Secondary | ICD-10-CM | POA: Diagnosis not present

## 2014-11-28 DIAGNOSIS — I2510 Atherosclerotic heart disease of native coronary artery without angina pectoris: Secondary | ICD-10-CM | POA: Insufficient documentation

## 2014-11-28 DIAGNOSIS — Z87891 Personal history of nicotine dependence: Secondary | ICD-10-CM | POA: Diagnosis not present

## 2014-11-28 DIAGNOSIS — I864 Gastric varices: Secondary | ICD-10-CM | POA: Insufficient documentation

## 2014-11-28 DIAGNOSIS — Z951 Presence of aortocoronary bypass graft: Secondary | ICD-10-CM | POA: Diagnosis not present

## 2014-11-28 HISTORY — PX: ESOPHAGEAL BANDING: SHX5518

## 2014-11-28 HISTORY — PX: ESOPHAGOGASTRODUODENOSCOPY: SHX5428

## 2014-11-28 LAB — GLUCOSE, CAPILLARY: Glucose-Capillary: 108 mg/dL — ABNORMAL HIGH (ref 65–99)

## 2014-11-28 SURGERY — EGD (ESOPHAGOGASTRODUODENOSCOPY)
Anesthesia: Moderate Sedation

## 2014-11-28 MED ORDER — MIDAZOLAM HCL 5 MG/5ML IJ SOLN
INTRAMUSCULAR | Status: AC
  Filled 2014-11-28: qty 10

## 2014-11-28 MED ORDER — MIDAZOLAM HCL 5 MG/5ML IJ SOLN
INTRAMUSCULAR | Status: DC | PRN
Start: 2014-11-28 — End: 2014-11-28
  Administered 2014-11-28: 3 mg via INTRAVENOUS
  Administered 2014-11-28 (×2): 2 mg via INTRAVENOUS

## 2014-11-28 MED ORDER — BUTAMBEN-TETRACAINE-BENZOCAINE 2-2-14 % EX AERO
INHALATION_SPRAY | CUTANEOUS | Status: DC | PRN
  Administered 2014-11-28: 2 via TOPICAL

## 2014-11-28 MED ORDER — STERILE WATER FOR IRRIGATION IR SOLN
Status: DC | PRN
  Administered 2014-11-28: 14:00:00

## 2014-11-28 MED ORDER — SODIUM CHLORIDE 0.9 % IV SOLN
INTRAVENOUS | Status: DC
Start: 2014-11-28 — End: 2014-11-28
  Administered 2014-11-28: 1000 mL via INTRAVENOUS

## 2014-11-28 MED ORDER — MEPERIDINE HCL 50 MG/ML IJ SOLN
INTRAMUSCULAR | Status: AC
  Filled 2014-11-28: qty 1

## 2014-11-28 MED ORDER — MEPERIDINE HCL 50 MG/ML IJ SOLN
INTRAMUSCULAR | Status: DC | PRN
  Administered 2014-11-28: 25 mg
  Administered 2014-11-28: 25 mg via INTRAVENOUS

## 2014-11-28 NOTE — Discharge Instructions (Signed)
Resume usual medications and diet. No driving for 24 hours. You must exercise and walk every day. Office visit in 6 months  Esophagogastroduodenoscopy Care After Refer to this sheet in the next few weeks. These instructions provide you with information on caring for yourself after your procedure. Your caregiver may also give you more specific instructions. Your treatment has been planned according to current medical practices, but problems sometimes occur. Call your caregiver if you have any problems or questions after your procedure. Dr Laural Golden [phone] HOME CARE INSTRUCTIONS  Do not eat or drink anything until the numbing medicine (local anesthetic) has worn off and your gag reflex has returned. You will know that the local anesthetic has worn off when you can swallow comfortably.  Do not drive for 24 hours after the procedure or as directed by your caregiver.  Only take medicines as directed by your caregiver. SEEK MEDICAL CARE IF:   You cannot stop coughing.  You are not urinating at all or less than usual. SEEK IMMEDIATE MEDICAL CARE IF:  You have difficulty swallowing.  You cannot eat or drink.  You have worsening throat or chest pain.  You have dizziness, lightheadedness, or you faint.  You have nausea or vomiting.  You have chills.  You have a fever.  You have severe abdominal pain.  You have black, tarry, or bloody stools. Document Released: 02/01/2012 Document Reviewed: 02/01/2012 [facility] Patient Information 2015 [place]. This information is not intended to replace advice given to you by your health care provider. Make sure you discuss any questions you have with your health care provider.

## 2014-11-28 NOTE — Op Note (Signed)
EGD PROCEDURE REPORT  PATIENT:  Samantha [name]  MR#:  [id number] Birthdate:  [birth year], 64 y.o., female Endoscopist:  Dr. Rogene Houston, MD Referred By:  Dr. [name], MD  Procedure Date: 11/28/2014  Procedure:   EGD  Indications:  Patient is 64 year old Caucasian female with cirrhosis complicated by portal hypertension and esophageal varices for which she is undergoing banding previously for primary prophylaxis. She is also on propranolol. She is returning for repeat evaluation. Last EGD with banding was in August 2015. Ultrasound 5 weeks ago revealed irregular liver contour consistent with cirrhosis and splenomegaly. There were no focal liver lesions or ascites.            Informed Consent:  The risks, benefits, alternatives & imponderables which include, but are not limited to, bleeding, infection, perforation, drug reaction and potential missed lesion have been reviewed.  The potential for biopsy, lesion removal, esophageal dilation, etc. have also been discussed.  Questions have been answered.  All parties agreeable.  Please see history & physical in medical record for more information.  Medications:  Demerol 50 mg IV Versed 8 mg IV Cetacaine spray topically for oropharyngeal anesthesia  Description of procedure:  The endoscope was introduced through the mouth and advanced to the second portion of the duodenum without difficulty or limitations. The mucosal surfaces were surveyed very carefully during advancement of the scope and upon withdrawal.  Findings:  Esophagus:  Mucosa of the proximal segment was normal. Focal scarring noted from previous banding single grade II varix noted. GEJ:  40 cm Stomach:  Stomach was empty and distended very well with insufflation. Folds in the proximal stomach were normal. Examination mucosa at gastric body and fundus revealed erythema and mosaic pattern. Antral mucosa revealed patchy erythema but no erosions or ulcers noted. Pyloric channel was  patent. Tenderness was unremarkable. Single varix noted at gastric fundus. Duodenum:  Normal bulbar and post bulbar mucosa.  Therapeutic/Diagnostic Maneuvers Performed:  None  Complications:  None  EBL: None  Impression: Single column of grade II esophageal varix not large enough to be banded. Focal scarring from previous esophageal variceal banding. Portal gastropathy. Engle column of gastric varix and fundus. No significant increase in size since EGD of August 2015.  Recommendations:  Standard instructions given. Once again patient advised that she must walk and exercise every day and must lose weight. Office visit in 6 months.  REHMAN,NAJEEB U  11/28/2014  2:02 PM  CC: Dr. [name], MD & Dr. Rayne Du ref. Jeshawn Melucci found

## 2014-11-28 NOTE — H&P (Signed)
Samantha [name] is an 64 y.o. female.   Chief Complaint: Patient's here for EGD and possible esophageal variceal banding. HPI: An 64 year old Caucasian female was cirrhosis secondary to NAFLD obligated biologic esophageal varices which have been banded previously for primary prophylaxis. Last EGD with banding session was in August 2015. She has not had any hematemesis or melena. She states she was diagnosed with diabetes mellitus. She continues to complain of frequent heartburn and is using times. She was on Nexium and now on omeprazole which his not working well. She denies abdominal pain.  Past Medical History  Diagnosis Date  . Rapid palpitations   . Coronary artery disease     Status post percutaneous coronary intervention and bare metal stenting  of the left circumflex and subsequent percutaneous transluminal coronary anioplasty and placement of a 3.0 NIR bare metal stent proximal to the previous stent.  . Hypertension   . Hyperlipidemia   . Obesity   . Gastroesophageal reflux disease   . Depression   . Osteoarthritis   . Status post hysterectomy   . History of pneumonia   . H/O: hysterectomy   . CHF (congestive heart failure)   . Esophageal varices     New 2013  . Cirrhosis of liver     Past Surgical History  Procedure Laterality Date  . Coronary artery bypass graft  May 31,2001    x 3 with a vein graft to the first diagonal, vein graft to the right coronary  artery, and a free left internal mammary  artery to the obtuse marginal   . Cardiac catheterization  2004    left internal mammary artery to the obtuse marginal  was found to be small and thread like.  The two grafts were patent.  The left circumflex had 90% in-stent restenosis and cutting balloon angioplasty was performed followed by placement of a 3.0 x 67m Taxus drug -eluting stent.    . Cardiac catheterization  2006    There was in-stent restenosis in the left circumflex and this was treated with cutting balloon  angioplasty   . Cardiac catheterization  2008    vein graft to the to the obtuse marginal was patent, although small, left circumflex had 40% in-stent restenosis, ejection fraction 40-45%.  The patient was medically mananged.  . Tonsillectomy    . Joint replacement    . Rotator cuff left  2007  . Back surgery    . Abdominal hysterectomy    . Colonoscopy  03/29/2011    Procedure: COLONOSCOPY;  Surgeon: MJamesetta So MD;  [facility];  Service: Gastroenterology;  Laterality: N/A;  . Esophagogastroduodenoscopy  12/20/2011    Procedure: ESOPHAGOGASTRODUODENOSCOPY (EGD);  Surgeon: MJamesetta So MD;  Location: [facility];  Service: Gastroenterology;  Laterality: N/A;  . Esophagogastroduodenoscopy N/A 07/04/2012    Procedure: ESOPHAGOGASTRODUODENOSCOPY (EGD);  Surgeon: NRogene Houston MD;  [facility]: [facility];  Service: Endoscopy;  Laterality: N/A;  235-moved to 255 Ann to notify pt  . Esophageal banding N/A 07/04/2012    Procedure: ESOPHAGEAL BANDING;  Surgeon: NRogene Houston MD;  [facility]: [facility];  Service: Endoscopy;  Laterality: N/A;  . Esophagogastroduodenoscopy N/A 09/17/2012    Procedure: ESOPHAGOGASTRODUODENOSCOPY (EGD);  Surgeon: NRogene Houston MD;  [facility]: [facility];  Service: Endoscopy;  Laterality: N/A;  730  . Esophageal banding N/A 09/17/2012    Procedure: ESOPHAGEAL BANDING;  Surgeon: NRogene Houston MD;  [facility]: [facility];  Service: Endoscopy;  Laterality: N/A;  . Esophagogastroduodenoscopy N/A 10/22/2013    Procedure: ESOPHAGOGASTRODUODENOSCOPY (EGD);  Surgeon: Rogene Houston, MD;  [facility]: [facility];  Service: Endoscopy;  Laterality: N/A;  730  . Esophageal banding N/A 10/22/2013    Procedure: ESOPHAGEAL BANDING;  Surgeon: Rogene Houston, MD;  Location: [facility];  Service: Endoscopy;  Laterality: N/A;  . Left heart catheterization with coronary/graft angiogram N/A 12/25/2013    Procedure: LEFT HEART CATHETERIZATION WITH  Beatrix Fetters;  Surgeon: Blane Ohara, MD;  Location: [facility] CATH LAB;  Service: Cardiovascular;  Laterality: N/A;    Family History  Problem Relation Age of Onset  . Heart attack Mother   . Heart attack Father 1    cause of death  . Coronary artery disease Sister     CABG   . Colon cancer Neg Hx    Social History:  reports that she quit smoking about 19 years ago. Her smoking use included Cigarettes. She has a 10 pack-year smoking history. She has never used smokeless tobacco. She reports that she does not drink alcohol or use illicit drugs.  Allergies:  Allergies  Allergen Reactions  . Ace Inhibitors Other (See Comments)  . Cefaclor Itching and Rash  . Cephalexin Itching and Rash  . Penicillins Rash  . Pregabalin Other (See Comments)    Retains fluid  . Tape Itching and Rash    Takes skin right off with medical tape    Medications Prior to Admission  Medication Sig Dispense Refill  . aspirin (ASPIRIN EC) 81 MG EC tablet Take 81 mg by mouth daily. Swallow whole.    [name] atorvastatin (LIPITOR) 20 MG tablet Take 20 mg by mouth daily at 6 PM.     . clonazePAM (KLONOPIN) 0.5 MG tablet Take 0.5 mg by mouth 3 (three) times daily as needed for anxiety.    [name] ezetimibe (ZETIA) 10 MG tablet Take 10 mg by mouth at bedtime.     [name] FLUoxetine (PROZAC) 40 MG capsule Take 40 mg by mouth daily.    . furosemide (LASIX) 40 MG tablet Take 60 mg by mouth daily. Take  1 1/2 tab daily    . HYDROcodone-acetaminophen (NORCO) 10-325 MG per tablet Take 1 tablet by mouth every 6 (six) hours as needed. pain    . isosorbide mononitrate (IMDUR) 30 MG CR tablet Take 30 mg by mouth daily.     [name] NITROSTAT 0.4 MG SL tablet Place 0.4 mg under the tongue every 5 (five) minutes as needed for chest pain.     [name] omeprazole (PRILOSEC) 40 MG capsule Take 40 mg by mouth daily.    . propranolol ER (INDERAL LA) 120 MG 24 hr capsule Take 120 mg by mouth daily.    . valsartan (DIOVAN) 160 MG tablet Take 80-160 mg  by mouth 2 (two) times daily. Take one tab in the morning and 1/2 tab at bed time.    [name] albuterol (PROVENTIL HFA;VENTOLIN HFA) 108 (90 BASE) MCG/ACT inhaler Inhale 1-2 puffs into the lungs every 6 (six) hours as needed for wheezing or shortness of breath.    . ALPRAZolam (XANAX) 0.5 MG tablet Take 0.5 mg by mouth 3 (three) times daily as needed for anxiety.     . cetirizine (ZYRTEC) 10 MG tablet Take 10 mg by mouth daily as needed for allergies.       Results for orders placed or performed during the hospital encounter of 11/28/14 (from the past 48 hour(s))  Glucose, capillary  Status: Abnormal   Collection Time: 11/28/14 12:15 PM  Result Value Ref Range   Glucose-Capillary 108 (H) 65 - 99 mg/dL   No results found.  ROS  Blood pressure 158/66, pulse 71, temperature 98.1 F (36.7 C), temperature source Oral, resp. rate 16, height 5' 2"  (1.575 m), weight 216 lb (97.977 kg), last menstrual period 03/29/2011, SpO2 97 %. Physical Exam  Constitutional: She appears well-developed and well-nourished.  HENT:  Mouth/Throat: Oropharynx is clear and moist.  Eyes: Conjunctivae are normal. No scleral icterus.  Neck: No thyromegaly present.  Cardiovascular: Normal rate, regular rhythm and normal heart sounds.   No murmur heard. Respiratory: Effort normal and breath sounds normal.  GI:  Abdomen is full but soft and nontender with palpable spleen.  Musculoskeletal:  Trace edema around ankles  Lymphadenopathy:    She has no cervical adenopathy.  Neurological: She is alert.  Skin: Skin is warm and dry.     Assessment/Plan History of esophageal varices. Cirrhosis secondary to NAFLD. EGD and possible esophageal variceal banding if varices have recurred.  REHMAN,NAJEEB U 11/28/2014, 1:32 PM

## 2014-12-01 DIAGNOSIS — Z23 Encounter for immunization: Secondary | ICD-10-CM | POA: Diagnosis not present

## 2014-12-03 ENCOUNTER — Encounter (HOSPITAL_COMMUNITY): Payer: Self-pay | Admitting: Internal Medicine

## 2014-12-30 DIAGNOSIS — C672 Malignant neoplasm of lateral wall of bladder: Secondary | ICD-10-CM | POA: Diagnosis not present

## 2015-01-08 ENCOUNTER — Encounter (HOSPITAL_COMMUNITY): Payer: Self-pay | Admitting: *Deleted

## 2015-01-08 ENCOUNTER — Ambulatory Visit (INDEPENDENT_AMBULATORY_CARE_PROVIDER_SITE_OTHER): Payer: Medicare Other | Admitting: Cardiology

## 2015-01-08 ENCOUNTER — Inpatient Hospital Stay (HOSPITAL_COMMUNITY)
Admission: AD | Admit: 2015-01-08 | Discharge: 2015-01-13 | DRG: 287 | Disposition: A | Discharge status: Home Health | Payer: Medicare Other | Source: Ambulatory Visit | Attending: Cardiovascular Disease | Admitting: Cardiovascular Disease

## 2015-01-08 ENCOUNTER — Telehealth: Payer: Self-pay | Admitting: Cardiovascular Disease

## 2015-01-08 ENCOUNTER — Encounter: Payer: Self-pay | Admitting: Cardiology

## 2015-01-08 VITALS — BP 120/72 | HR 78 | Ht 62.0 in | Wt 219.6 lb

## 2015-01-08 DIAGNOSIS — Z951 Presence of aortocoronary bypass graft: Secondary | ICD-10-CM

## 2015-01-08 DIAGNOSIS — K7460 Unspecified cirrhosis of liver: Secondary | ICD-10-CM | POA: Diagnosis present

## 2015-01-08 DIAGNOSIS — Z87891 Personal history of nicotine dependence: Secondary | ICD-10-CM | POA: Diagnosis not present

## 2015-01-08 DIAGNOSIS — E782 Mixed hyperlipidemia: Secondary | ICD-10-CM | POA: Diagnosis not present

## 2015-01-08 DIAGNOSIS — Z888 Allergy status to other drugs, medicaments and biological substances status: Secondary | ICD-10-CM

## 2015-01-08 DIAGNOSIS — I10 Essential (primary) hypertension: Secondary | ICD-10-CM | POA: Diagnosis present

## 2015-01-08 DIAGNOSIS — I5043 Acute on chronic combined systolic (congestive) and diastolic (congestive) heart failure: Secondary | ICD-10-CM | POA: Diagnosis present

## 2015-01-08 DIAGNOSIS — Z9861 Coronary angioplasty status: Secondary | ICD-10-CM | POA: Diagnosis not present

## 2015-01-08 DIAGNOSIS — D696 Thrombocytopenia, unspecified: Secondary | ICD-10-CM | POA: Diagnosis present

## 2015-01-08 DIAGNOSIS — Z880 Allergy status to penicillin: Secondary | ICD-10-CM | POA: Diagnosis not present

## 2015-01-08 DIAGNOSIS — I255 Ischemic cardiomyopathy: Secondary | ICD-10-CM | POA: Diagnosis present

## 2015-01-08 DIAGNOSIS — E119 Type 2 diabetes mellitus without complications: Secondary | ICD-10-CM | POA: Diagnosis present

## 2015-01-08 DIAGNOSIS — I200 Unstable angina: Secondary | ICD-10-CM | POA: Diagnosis present

## 2015-01-08 DIAGNOSIS — I110 Hypertensive heart disease with heart failure: Secondary | ICD-10-CM | POA: Diagnosis not present

## 2015-01-08 DIAGNOSIS — I5042 Chronic combined systolic (congestive) and diastolic (congestive) heart failure: Secondary | ICD-10-CM

## 2015-01-08 DIAGNOSIS — I25119 Atherosclerotic heart disease of native coronary artery with unspecified angina pectoris: Secondary | ICD-10-CM

## 2015-01-08 DIAGNOSIS — Z6839 Body mass index (BMI) 39.0-39.9, adult: Secondary | ICD-10-CM | POA: Diagnosis not present

## 2015-01-08 DIAGNOSIS — I2510 Atherosclerotic heart disease of native coronary artery without angina pectoris: Secondary | ICD-10-CM | POA: Diagnosis not present

## 2015-01-08 DIAGNOSIS — E669 Obesity, unspecified: Secondary | ICD-10-CM | POA: Diagnosis present

## 2015-01-08 DIAGNOSIS — Z955 Presence of coronary angioplasty implant and graft: Secondary | ICD-10-CM | POA: Diagnosis not present

## 2015-01-08 DIAGNOSIS — Z79899 Other long term (current) drug therapy: Secondary | ICD-10-CM | POA: Diagnosis not present

## 2015-01-08 DIAGNOSIS — I509 Heart failure, unspecified: Secondary | ICD-10-CM | POA: Diagnosis not present

## 2015-01-08 DIAGNOSIS — I8500 Esophageal varices without bleeding: Secondary | ICD-10-CM | POA: Diagnosis present

## 2015-01-08 DIAGNOSIS — I25110 Atherosclerotic heart disease of native coronary artery with unstable angina pectoris: Secondary | ICD-10-CM | POA: Diagnosis not present

## 2015-01-08 DIAGNOSIS — I5023 Acute on chronic systolic (congestive) heart failure: Secondary | ICD-10-CM | POA: Diagnosis present

## 2015-01-08 HISTORY — DX: Thrombocytopenia, unspecified: D69.6

## 2015-01-08 LAB — COMPREHENSIVE METABOLIC PANEL
ALT: 22 U/L (ref 14–54)
AST: 55 U/L — ABNORMAL HIGH (ref 15–41)
Albumin: 3.8 g/dL (ref 3.5–5.0)
Alkaline Phosphatase: 92 U/L (ref 38–126)
Anion gap: 11 (ref 5–15)
BUN: 5 mg/dL — ABNORMAL LOW (ref 6–20)
CO2: 29 mmol/L (ref 22–32)
Calcium: 9.8 mg/dL (ref 8.9–10.3)
Chloride: 103 mmol/L (ref 101–111)
Creatinine, Ser: 0.87 mg/dL (ref 0.44–1.00)
GFR calc Af Amer: >60 mL/min (ref >60)
GFR calc non Af Amer: >60 mL/min (ref >60)
Glucose, Bld: 98 mg/dL (ref 65–99)
Potassium: 4.6 mmol/L (ref 3.5–5.1)
Sodium: 143 mmol/L (ref 135–145)
Total Bilirubin: 0.9 mg/dL (ref 0.3–1.2)
Total Protein: 8.1 g/dL (ref 6.5–8.1)

## 2015-01-08 LAB — CBC WITH DIFFERENTIAL/PLATELET
Basophils Absolute: 0 10*3/uL (ref 0.0–0.1)
Basophils Relative: 1 %
Eosinophils Absolute: 0.3 10*3/uL (ref 0.0–0.7)
Eosinophils Relative: 5 %
HCT: 43.9 % (ref 36.0–46.0)
Hemoglobin: 13.4 g/dL (ref 12.0–15.0)
Lymphocytes Relative: 28 %
Lymphs Abs: 1.7 10*3/uL (ref 0.7–4.0)
MCH: 25.3 pg — ABNORMAL LOW (ref 26.0–34.0)
MCHC: 30.5 g/dL (ref 30.0–36.0)
MCV: 82.8 fL (ref 78.0–100.0)
Monocytes Absolute: 0.7 10*3/uL (ref 0.1–1.0)
Monocytes Relative: 12 %
Neutro Abs: 3.3 10*3/uL (ref 1.7–7.7)
Neutrophils Relative %: 55 %
Platelets: 105 10*3/uL — ABNORMAL LOW (ref 150–400)
RBC: 5.3 MIL/uL — ABNORMAL HIGH (ref 3.87–5.11)
RDW: 16.2 % — ABNORMAL HIGH (ref 11.5–15.5)
WBC: 6 10*3/uL (ref 4.0–10.5)

## 2015-01-08 LAB — TROPONIN I: Troponin I: <0.03 ng/mL (ref <0.031)

## 2015-01-08 MED ORDER — IRBESARTAN 150 MG PO TABS
150.0000 mg | ORAL_TABLET | Freq: Every day | ORAL | Status: DC
  Administered 2015-01-09 – 2015-01-13 (×5): 150 mg via ORAL
  Filled 2015-01-08 (×6): qty 1

## 2015-01-08 MED ORDER — ASPIRIN 300 MG RE SUPP: 300.0000 mg | RECTAL | Status: AC

## 2015-01-08 MED ORDER — ASPIRIN 81 MG PO CHEW
81.0000 mg | CHEWABLE_TABLET | ORAL | Status: AC
  Administered 2015-01-09: 81 mg via ORAL
  Filled 2015-01-08: qty 1

## 2015-01-08 MED ORDER — ASPIRIN EC 81 MG PO TBEC: 81.0000 mg | DELAYED_RELEASE_TABLET | Freq: Every day | ORAL | Status: DC

## 2015-01-08 MED ORDER — SODIUM CHLORIDE 0.9 % IJ SOLN: 3.0000 mL | INTRAMUSCULAR | Status: DC | PRN

## 2015-01-08 MED ORDER — FLUOXETINE HCL 20 MG PO CAPS
40.0000 mg | ORAL_CAPSULE | Freq: Every day | ORAL | Status: DC
Start: 2015-01-08 — End: 2015-01-13
  Administered 2015-01-09 – 2015-01-13 (×5): 40 mg via ORAL
  Filled 2015-01-08 (×5): qty 2

## 2015-01-08 MED ORDER — HEPARIN (PORCINE) IN NACL 100-0.45 UNIT/ML-% IJ SOLN
950.0000 [IU]/h | INTRAMUSCULAR | Status: DC
  Administered 2015-01-08: 900 [IU]/h via INTRAVENOUS
  Filled 2015-01-08 (×2): qty 250

## 2015-01-08 MED ORDER — ACETAMINOPHEN 325 MG PO TABS: 650.0000 mg | ORAL_TABLET | ORAL | Status: DC | PRN

## 2015-01-08 MED ORDER — HEPARIN BOLUS VIA INFUSION
4000.0000 [IU] | Freq: Once | INTRAVENOUS | Status: AC
  Administered 2015-01-08: 4000 [IU] via INTRAVENOUS
  Filled 2015-01-08: qty 4000

## 2015-01-08 MED ORDER — PANTOPRAZOLE SODIUM 40 MG PO TBEC
40.0000 mg | DELAYED_RELEASE_TABLET | Freq: Every day | ORAL | Status: DC
  Administered 2015-01-09 – 2015-01-13 (×5): 40 mg via ORAL
  Filled 2015-01-08 (×6): qty 1

## 2015-01-08 MED ORDER — SODIUM CHLORIDE 0.9 % IJ SOLN
3.0000 mL | Freq: Two times a day (BID) | INTRAMUSCULAR | Status: DC
  Administered 2015-01-08 – 2015-01-09 (×2): 3 mL via INTRAVENOUS

## 2015-01-08 MED ORDER — SODIUM CHLORIDE 0.9 % IV SOLN
INTRAVENOUS | Status: DC
Start: 2015-01-09 — End: 2015-01-09
  Administered 2015-01-09: 06:00:00 via INTRAVENOUS

## 2015-01-08 MED ORDER — SODIUM CHLORIDE 0.9 % IV SOLN: 250.0000 mL | INTRAVENOUS | Status: DC | PRN

## 2015-01-08 MED ORDER — NITROGLYCERIN IN D5W 200-5 MCG/ML-% IV SOLN
3.0000 ug/min | INTRAVENOUS | Status: DC
  Administered 2015-01-08: 10 ug/min via INTRAVENOUS
  Filled 2015-01-08 (×2): qty 250

## 2015-01-08 MED ORDER — ALBUTEROL SULFATE (2.5 MG/3ML) 0.083% IN NEBU: 3.0000 mL | INHALATION_SOLUTION | Freq: Four times a day (QID) | RESPIRATORY_TRACT | Status: DC | PRN

## 2015-01-08 MED ORDER — ASPIRIN 81 MG PO CHEW
324.0000 mg | CHEWABLE_TABLET | ORAL | Status: AC
  Administered 2015-01-08: 324 mg via ORAL
  Filled 2015-01-08: qty 4

## 2015-01-08 MED ORDER — ATORVASTATIN CALCIUM 20 MG PO TABS
20.0000 mg | ORAL_TABLET | Freq: Every day | ORAL | Status: DC
  Administered 2015-01-08 – 2015-01-12 (×5): 20 mg via ORAL
  Filled 2015-01-08 (×5): qty 1

## 2015-01-08 MED ORDER — ASPIRIN EC 81 MG PO TBEC
81.0000 mg | DELAYED_RELEASE_TABLET | Freq: Every day | ORAL | Status: DC
  Administered 2015-01-11: 81 mg via ORAL
  Filled 2015-01-08 (×3): qty 1

## 2015-01-08 MED ORDER — NITROGLYCERIN 0.4 MG SL SUBL
0.4000 mg | SUBLINGUAL_TABLET | SUBLINGUAL | Status: DC | PRN
  Administered 2015-01-10 (×2): 0.4 mg via SUBLINGUAL
  Filled 2015-01-08: qty 1

## 2015-01-08 MED ORDER — ONDANSETRON HCL 4 MG/2ML IJ SOLN: 4.0000 mg | Freq: Four times a day (QID) | INTRAMUSCULAR | Status: DC | PRN

## 2015-01-08 MED ORDER — EZETIMIBE 10 MG PO TABS
10.0000 mg | ORAL_TABLET | Freq: Every day | ORAL | Status: DC
  Administered 2015-01-08 – 2015-01-12 (×5): 10 mg via ORAL
  Filled 2015-01-08 (×5): qty 1

## 2015-01-08 MED ORDER — PROPRANOLOL HCL ER 120 MG PO CP24
120.0000 mg | ORAL_CAPSULE | Freq: Every day | ORAL | Status: DC
  Administered 2015-01-09 – 2015-01-10 (×2): 120 mg via ORAL
  Filled 2015-01-08 (×3): qty 1

## 2015-01-08 NOTE — Telephone Encounter (Signed)
New message   Patient has appt today with Flex @ 2:30 pm   Pt C/O of Chest Pain: STAT if CP now or developed within 24 hours  1. Are you having CP right now? Patient verbalize  a little bit not too bad - pain level 3   2. Are you experiencing any other symptoms (ex. SOB, nausea, vomiting, sweating)? Swelling in feets, hands, face   3. How long have you been experiencing CP? 3-4 days   4. Is your CP continuous or coming and going? Patients states more ingestion that won't go away.   5. Have you taken Nitroglycerin? Yes. A lot    ?

## 2015-01-08 NOTE — Patient Instructions (Addendum)
YOU ARE BEING ADMITTED TO HOSPITAL TO [facility]   Testing/Procedures:  Your physician has requested that you have a cardiac catheterization. Cardiac catheterization is used to diagnose and/or treat various heart conditions. Doctors may recommend this procedure for a number of different reasons. The most common reason is to evaluate chest pain. Chest pain can be a symptom of coronary artery disease (CAD), and cardiac catheterization can show whether plaque is narrowing or blocking your heart's arteries. This procedure is also used to evaluate the valves, as well as measure the blood flow and oxygen levels in different parts of your heart. For further information please visit [email or web address]. Please follow instruction sheet, as given.       Follow-Up: WILL BE DETERMINED AFTER YOUR VISIT TO HOSPITAL    Any Other Special Instructions Will Be Listed Below (If Applicable).

## 2015-01-08 NOTE — Progress Notes (Signed)
ANTICOAGULATION CONSULT NOTE - Initial Consult  Pharmacy Consult for heparin Indication: chest pain/ACS  Allergies  Allergen Reactions  . Ace Inhibitors Other (See Comments)  . Cefaclor Itching and Rash  . Cephalexin Itching and Rash  . Penicillins Rash  . Pregabalin Other (See Comments)    Retains fluid  . Tape Itching and Rash    Takes skin right off with medical tape    Patient Measurements:   Heparin Dosing Weight: 73.7kg  Vital Signs: Temp: 98 F (36.7 C) (11/10 1655) Temp Source: Oral (11/10 1655) BP: 148/69 mmHg (11/10 1655) Pulse Rate: 76 (11/10 1655)  Labs: No results for input(s): HGB, HCT, PLT, APTT, LABPROT, INR, HEPARINUNFRC, CREATININE, CKTOTAL, CKMB, TROPONINI in the last 72 hours.  CrCl cannot be calculated (Patient has no serum creatinine result on file.).   Medical History: Past Medical History  Diagnosis Date  . Rapid palpitations   . Coronary artery disease     Status post percutaneous coronary intervention and bare metal stenting  of the left circumflex and subsequent percutaneous transluminal coronary anioplasty and placement of a 3.0 NIR bare metal stent proximal to the previous stent.  . Hypertension   . Hyperlipidemia   . Obesity   . Gastroesophageal reflux disease   . Depression   . Osteoarthritis   . Status post hysterectomy   . History of pneumonia   . H/O: hysterectomy   . CHF (congestive heart failure) ([place])   . Esophageal varices ([place])     New 2013  . Cirrhosis of liver ([facility])     Assessment: 84 yof with CAD history. Pharmacy consulted to dose heparin for UA (no anticoag pta). Planning LHC +/- PCI tomorrow per cards note. Labs pending. No bleed documented.  Goal of Therapy:  Heparin level 0.3-0.7 units/ml Monitor platelets by anticoagulation protocol: Yes   Plan:  Heparin 4000 unit bolus Heparin @ 900 units/h 6h HL Daily HL/CBC Mon s/sx bleeding LHC tomorrow  Elicia Lamp, PharmD Clinical Pharmacist Pager  [phone] 01/08/2015 5:08 PM

## 2015-01-08 NOTE — Telephone Encounter (Signed)
Reviewed with Ellen Henri, PA and as long as pt is not having chest pain she can be seen in office this afternoon.  If she develops chest pain will need to go to ED. I spoke with pt and gave her this information.  She is not having any chest pain at this time.  She is aware to call 911 if symptoms occur prior to appt today.  I recommended to pt she have someone drive her to the appt today.

## 2015-01-08 NOTE — Telephone Encounter (Signed)
Spoke with pt.  She called this morning to schedule appt due to chest pain. Appt was made for 2:30 today and note sent to triage.   Pt reports chest pain which started 4-5 days ago.  Comes and goes.  Reports she has taken NTG 7-8 times in last few days.  Last time she took NTG was this past Sunday.  Relief is usually with one NTG.  One time had to take 2 NTG.  She reports chest pain this AM which lasted about 60 minutes but went away on it's own.  Shortness of breath with walking around the house recently.  Chest pain is worse at night when she lies down. Swelling in feet and ankles for last several days.  Swelling is worse when she is on her feet.  Improves some when she puts her feet up at night.   At the current time she is not having any chest pain or shortness of breath.  Will review with provider in office. Pt aware if symptoms worsen she should call 911.

## 2015-01-08 NOTE — Progress Notes (Signed)
 01/08/2015 Samantha [name]  04/07/1950  [id number]  Primary Physician SASSER,PAUL W, MD Primary Cardiologist: Dr. Cooper   Reason for Visit/CC: Unstable Angina  HPI: Patient is a 64-year-old female, followed by Dr. Cooper, with a past medical history significant for CAD, hypertension, hyperlipidemia and diabetes. She denies any history of tobacco abuse. In 2001 she underwent CABG surgery. Since that time, she has required coronary stenting and Cutting Balloon angioplasty. She has a long Taxus drug-eluting stent used to treat in-stent restenosis in the left circumflex. She had been maintained on long-term dual antiplatelet therapy. 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 She has undergone banding. Plavix was discontinued but she was continued on ASA. Her last echocardiogram was done in 2012 and her left ventricular ejection fraction was low normal at 50% with inferolateral and posterior wall hypokinesis.   She underwent a repeat cardiac catheterization October 2015 by Dr. Cooper revealing two-vessel coronary artery disease with minimal stenosis of the left main and LAD. There were patent stents in the left circumflex and total occlusion of a nondominant RCA. She had continued patency of the saphenous vein graft to the diagonal, saphenous vein graft to the acute marginal branch of the RCA and a free RIMA to the distal left circumflex. Her anatomy was felt to be stable compared to prior cath. Medical therapy was elected. Moderate segmental systolic dysfunction was also noted at that time with EF estimated at 40-45%. It appears that she has not been seen by Dr. Cooper since that time.   She now presents to clinic with complaints of chest pain concerning for unstable angina. Symptoms have occurred off and on over the last week and feel very summer to her previous angina. She reports substernal chest pressure radiating up into her neck and left arm. It feels  like indigestion but no relief with TUMS. She has had relief with sublingual nitroglycerin. SShe reports full compliance with all of her medications including her aspirin, statin, Imdur and PPI.  She is chest pain-free in clinic however her EKG demonstrates new T-wave inversions in leads II, III, V4 through V6. Vital signs are stable.    Current Outpatient Prescriptions  Medication Sig Dispense Refill  . albuterol (PROVENTIL HFA;VENTOLIN HFA) 108 (90 BASE) MCG/ACT inhaler Inhale 1-2 puffs into the lungs every 6 (six) hours as needed for wheezing or shortness of breath.    . aspirin (ASPIRIN EC) 81 MG EC tablet Take 81 mg by mouth daily. Swallow whole.    . atorvastatin (LIPITOR) 20 MG tablet Take 20 mg by mouth daily at 6 PM.     . cetirizine (ZYRTEC) 10 MG tablet Take 10 mg by mouth daily as needed for allergies.     . clonazePAM (KLONOPIN) 0.5 MG tablet Take 0.5 mg by mouth 3 (three) times daily as needed for anxiety.    . ezetimibe (ZETIA) 10 MG tablet Take 10 mg by mouth at bedtime.     . FLUoxetine (PROZAC) 40 MG capsule Take 40 mg by mouth daily.    . furosemide (LASIX) 40 MG tablet Take 60 mg by mouth daily. Take 1 1/2 tab daily    . HYDROcodone-acetaminophen (NORCO) 10-325 MG per tablet Take 1 tablet by mouth every 6 (six) hours as needed (cough). pain    . isosorbide mononitrate (IMDUR) 30 MG CR tablet Take 30 mg by mouth daily.     . NITROSTAT 0.4 MG SL tablet Place 0.4 mg under the tongue every 5 (five)   minutes as needed for chest pain (x 3 tabs daily).     . omeprazole (PRILOSEC) 40 MG capsule Take 40 mg by mouth daily.    . propranolol ER (INDERAL LA) 120 MG 24 hr capsule Take 120 mg by mouth daily.    . valsartan (DIOVAN) 160 MG tablet Take 80-160 mg by mouth 2 (two) times daily. Take one tab in the morning and 1/2 tab at bed time.     No current facility-administered medications for this visit.     Allergies  Allergen Reactions  . Ace Inhibitors Other (See Comments)  . Cefaclor Itching and Rash  . Cephalexin Itching and Rash  . Penicillins Rash  . Pregabalin Other (See Comments)    Retains fluid  . Tape Itching and Rash    Takes skin right off with medical tape    Social History   Social History  . Marital Status: Divorced    Spouse Name: N/A  . Number of Children: N/A  . Years of Education: N/A   Occupational History  . Disabled    Social History Main Topics  . Smoking status: Former Smoker -- 0.50 packs/day for 20 years    Types: Cigarettes    Quit date: 07/21/1995  . Smokeless tobacco: Never Used  . Alcohol Use: No  . Drug Use: No  . Sexual Activity: Not on file   Other Topics Concern  . Not on file   Social History Narrative   Lives in [place] by herself.      Review of Systems: General: negative for chills, fever, night sweats or weight changes.  Cardiovascular: negative for chest pain, dyspnea on exertion, edema, orthopnea, palpitations, paroxysmal nocturnal dyspnea or shortness of breath Dermatological: negative for rash Respiratory: negative for cough or wheezing Urologic: negative for hematuria Abdominal: negative for nausea, vomiting, diarrhea, bright red blood per rectum, melena, or hematemesis Neurologic: negative for visual changes, syncope, or dizziness All other systems reviewed and are otherwise negative except as noted above.    Blood pressure 120/72, pulse 78, height 5' 2" (1.575 m), weight 219 lb 9.6 oz (99.61 kg), last menstrual period 03/29/2011.  General appearance: alert, cooperative and no distress Neck: no carotid bruit and no JVD Lungs: clear to auscultation bilaterally Heart: regular rate and rhythm, S1, S2 normal, no murmur, click, rub or gallop Extremities: no LEE Pulses: 2+ and symmetric Skin: warm and dry Neurologic: Grossly  normal  EKG   NSR with new TWI in II, III, V4-6  ASSESSMENT AND PLAN:   1. Unstable Angina/CAD: known h/o CAD s/p CABG and stenting to left circumflex. LHC in 2015 showed patent grafts and patent LCx stent with minimal stenosis of the left main and LAD. Her symptoms are c/w unstable angina and similar to previous angina. Also nitrate responsive. She has new EKG changes concerning for ischemia. I've discuss case with Dr. Turner, DOD. We will plan to admit to [facility] and plan on LHC +/- PCI tomorrow. Will continue home meds. Check CBC, CMP and INR.   2. HTN: BP is currently well controlled. Continue home meds  3.HLD: continue statin. No recent lipid panel in the last year. Will order FLP in the am.   4. DM: recently diagnosed by PCP. Diet controlled. Continue management per primary.    PLAN Admit to [facility] for unstable angina with plans for LHC 01/09/15.   Mahayla Haddaway PA-C 01/08/2015 2:56 PM  Patient seen and examined with Raenah Murley, PA. We discussed all aspects of the   encounter. I agree with the assessment and plan as stated above. Patient has a history of CAD with CABG and LCx stent. She has had problems with CP in the past and last cath a year ago showed patent grafts and LCx stent. SHe now is having recurrent CP similar to prior angina now with new ST changes in the inferolateral leads. Will admit to [facility] and start IV Heparin and NTG gtts. Continue statin/ASA/BB. NPO after MN for cath in am.   Signed: Traci TUrner, MD [facility] 01/08/2015      

## 2015-01-09 ENCOUNTER — Encounter (HOSPITAL_COMMUNITY)
Admission: AD | Disposition: A | Discharge status: Home Health | Payer: Self-pay | Source: Ambulatory Visit | Attending: Cardiovascular Disease

## 2015-01-09 ENCOUNTER — Ambulatory Visit (HOSPITAL_COMMUNITY)
Admission: RE | Admit: 2015-01-09 | Payer: Medicare Other | Source: Ambulatory Visit | Admitting: Interventional Cardiology

## 2015-01-09 ENCOUNTER — Encounter (HOSPITAL_COMMUNITY): Payer: Self-pay | Admitting: Cardiology

## 2015-01-09 DIAGNOSIS — I2510 Atherosclerotic heart disease of native coronary artery without angina pectoris: Secondary | ICD-10-CM

## 2015-01-09 HISTORY — PX: CARDIAC CATHETERIZATION: SHX172

## 2015-01-09 LAB — LIPID PANEL
Cholesterol: 135 mg/dL (ref 0–200)
HDL: 37 mg/dL — ABNORMAL LOW (ref >40)
LDL Cholesterol: 74 mg/dL (ref 0–99)
Total CHOL/HDL Ratio: 3.6 RATIO
Triglycerides: 119 mg/dL (ref <150)
VLDL: 24 mg/dL (ref 0–40)

## 2015-01-09 LAB — CBC
HCT: 39.4 % (ref 36.0–46.0)
Hemoglobin: 12.2 g/dL (ref 12.0–15.0)
MCH: 25.6 pg — ABNORMAL LOW (ref 26.0–34.0)
MCHC: 31 g/dL (ref 30.0–36.0)
MCV: 82.6 fL (ref 78.0–100.0)
Platelets: 78 10*3/uL — ABNORMAL LOW (ref 150–400)
RBC: 4.77 MIL/uL (ref 3.87–5.11)
RDW: 16.4 % — ABNORMAL HIGH (ref 11.5–15.5)
WBC: 4 10*3/uL (ref 4.0–10.5)

## 2015-01-09 LAB — BASIC METABOLIC PANEL
Anion gap: 8 (ref 5–15)
BUN: 8 mg/dL (ref 6–20)
CO2: 29 mmol/L (ref 22–32)
Calcium: 9 mg/dL (ref 8.9–10.3)
Chloride: 105 mmol/L (ref 101–111)
Creatinine, Ser: 0.85 mg/dL (ref 0.44–1.00)
GFR calc Af Amer: >60 mL/min (ref >60)
GFR calc non Af Amer: >60 mL/min (ref >60)
Glucose, Bld: 128 mg/dL — ABNORMAL HIGH (ref 65–99)
Potassium: 3.7 mmol/L (ref 3.5–5.1)
Sodium: 142 mmol/L (ref 135–145)

## 2015-01-09 LAB — PROTIME-INR
INR: 1.24 (ref 0.00–1.49)
Prothrombin Time: 15.8 seconds — ABNORMAL HIGH (ref 11.6–15.2)

## 2015-01-09 LAB — HEPARIN LEVEL (UNFRACTIONATED)
Heparin Unfractionated: 0.31 IU/mL (ref 0.30–0.70)
Heparin Unfractionated: 0.23 IU/mL — ABNORMAL LOW (ref 0.30–0.70)

## 2015-01-09 LAB — TROPONIN I
Troponin I: <0.03 ng/mL (ref <0.031)
Troponin I: <0.03 ng/mL (ref <0.031)

## 2015-01-09 SURGERY — LEFT HEART CATH AND CORS/GRAFTS ANGIOGRAPHY
Anesthesia: LOCAL

## 2015-01-09 MED ORDER — ONDANSETRON HCL 4 MG/2ML IJ SOLN: 4.0000 mg | Freq: Four times a day (QID) | INTRAMUSCULAR | Status: DC | PRN

## 2015-01-09 MED ORDER — MIDAZOLAM HCL 2 MG/2ML IJ SOLN
INTRAMUSCULAR | Status: DC | PRN
  Administered 2015-01-09 (×2): 1 mg via INTRAVENOUS

## 2015-01-09 MED ORDER — MIDAZOLAM HCL 2 MG/2ML IJ SOLN
INTRAMUSCULAR | Status: AC
  Filled 2015-01-09: qty 4

## 2015-01-09 MED ORDER — HEPARIN SODIUM (PORCINE) 1000 UNIT/ML IJ SOLN
INTRAMUSCULAR | Status: DC | PRN
  Administered 2015-01-09: 5000 [IU] via INTRAVENOUS

## 2015-01-09 MED ORDER — FENTANYL CITRATE (PF) 100 MCG/2ML IJ SOLN
INTRAMUSCULAR | Status: DC | PRN
  Administered 2015-01-09 (×2): 50 ug via INTRAVENOUS

## 2015-01-09 MED ORDER — HEPARIN (PORCINE) IN NACL 2-0.9 UNIT/ML-% IJ SOLN
INTRAMUSCULAR | Status: DC | PRN
  Administered 2015-01-09: 10 mL via INTRA_ARTERIAL

## 2015-01-09 MED ORDER — HEPARIN (PORCINE) IN NACL 2-0.9 UNIT/ML-% IJ SOLN
INTRAMUSCULAR | Status: AC
  Filled 2015-01-09: qty 1000

## 2015-01-09 MED ORDER — VERAPAMIL HCL 2.5 MG/ML IV SOLN
INTRAVENOUS | Status: AC
  Filled 2015-01-09: qty 2

## 2015-01-09 MED ORDER — FUROSEMIDE 10 MG/ML IJ SOLN
INTRAMUSCULAR | Status: AC
Start: 2015-01-09 — End: 2015-01-09
  Filled 2015-01-09: qty 4

## 2015-01-09 MED ORDER — SODIUM CHLORIDE 0.9 % IV SOLN: 250.0000 mL | INTRAVENOUS | Status: DC | PRN

## 2015-01-09 MED ORDER — IOHEXOL 350 MG/ML SOLN
INTRAVENOUS | Status: DC | PRN
  Administered 2015-01-09: 95 mL via INTRAVENOUS

## 2015-01-09 MED ORDER — ASPIRIN 81 MG PO CHEW
81.0000 mg | CHEWABLE_TABLET | Freq: Every day | ORAL | Status: DC
  Administered 2015-01-10 – 2015-01-13 (×4): 81 mg via ORAL
  Filled 2015-01-09 (×3): qty 1

## 2015-01-09 MED ORDER — FUROSEMIDE 10 MG/ML IJ SOLN
INTRAMUSCULAR | Status: DC | PRN
  Administered 2015-01-09: 40 mg via INTRAVENOUS

## 2015-01-09 MED ORDER — HEPARIN (PORCINE) IN NACL 2-0.9 UNIT/ML-% IJ SOLN
INTRAMUSCULAR | Status: DC | PRN
  Administered 2015-01-09: 12:00:00

## 2015-01-09 MED ORDER — HEPARIN (PORCINE) IN NACL 100-0.45 UNIT/ML-% IJ SOLN
1050.0000 [IU]/h | INTRAMUSCULAR | Status: DC
Start: 2015-01-09 — End: 2015-01-09

## 2015-01-09 MED ORDER — SODIUM CHLORIDE 0.9 % IJ SOLN
3.0000 mL | Freq: Two times a day (BID) | INTRAMUSCULAR | Status: DC
  Administered 2015-01-10: 3 mL via INTRAVENOUS

## 2015-01-09 MED ORDER — ENOXAPARIN SODIUM 40 MG/0.4ML ~~LOC~~ SOLN
40.0000 mg | SUBCUTANEOUS | Status: DC
  Administered 2015-01-10 – 2015-01-13 (×4): 40 mg via SUBCUTANEOUS
  Filled 2015-01-09 (×4): qty 0.4

## 2015-01-09 MED ORDER — LIDOCAINE HCL (PF) 1 % IJ SOLN
INTRAMUSCULAR | Status: AC
  Filled 2015-01-09: qty 30

## 2015-01-09 MED ORDER — FUROSEMIDE 10 MG/ML IJ SOLN
40.0000 mg | Freq: Every day | INTRAMUSCULAR | Status: DC
  Administered 2015-01-10: 40 mg via INTRAVENOUS
  Filled 2015-01-09: qty 4

## 2015-01-09 MED ORDER — FENTANYL CITRATE (PF) 100 MCG/2ML IJ SOLN
INTRAMUSCULAR | Status: AC
Start: 2015-01-09 — End: 2015-01-09
  Filled 2015-01-09: qty 4

## 2015-01-09 MED ORDER — SODIUM CHLORIDE 0.9 % WEIGHT BASED INFUSION
1.0000 mL/kg/h | INTRAVENOUS | Status: AC
  Administered 2015-01-09: 1 mL/kg/h via INTRAVENOUS

## 2015-01-09 MED ORDER — SODIUM CHLORIDE 0.9 % IJ SOLN: 3.0000 mL | INTRAMUSCULAR | Status: DC | PRN

## 2015-01-09 MED ORDER — LIDOCAINE HCL (PF) 1 % IJ SOLN
INTRAMUSCULAR | Status: DC | PRN
  Administered 2015-01-09: 2 mL

## 2015-01-09 MED ORDER — HEPARIN SODIUM (PORCINE) 1000 UNIT/ML IJ SOLN
INTRAMUSCULAR | Status: AC
  Filled 2015-01-09: qty 1

## 2015-01-09 MED ORDER — ACETAMINOPHEN 325 MG PO TABS
650.0000 mg | ORAL_TABLET | ORAL | Status: DC | PRN
  Administered 2015-01-09 – 2015-01-12 (×2): 650 mg via ORAL
  Filled 2015-01-09 (×2): qty 2

## 2015-01-09 SURGICAL SUPPLY — 11 items
CATH INFINITI 5 FR JL3.5 (CATHETERS) ×2 IMPLANT
CATH INFINITI JR4 5F (CATHETERS) ×2 IMPLANT
DEVICE RAD COMP TR BAND LRG (VASCULAR PRODUCTS) ×2 IMPLANT
GLIDESHEATH SLEND A-KIT 6F 22G (SHEATH) ×2 IMPLANT
GLIDESHEATH SLEND SS 6F .021 (SHEATH) IMPLANT
KIT HEART LEFT (KITS) ×2 IMPLANT
PACK CARDIAC CATHETERIZATION (CUSTOM PROCEDURE TRAY) ×2 IMPLANT
TRANSDUCER W/STOPCOCK (MISCELLANEOUS) ×2 IMPLANT
TUBING CIL FLEX 10 FLL-RA (TUBING) ×2 IMPLANT
WIRE HI TORQ VERSACORE-J 145CM (WIRE) ×1 IMPLANT
WIRE SAFE-T 1.5MM-J .035X260CM (WIRE) ×2 IMPLANT

## 2015-01-09 NOTE — Progress Notes (Signed)
UR Completed Rosalia Mcavoy Graves-Bigelow, RN,BSN [phone]  

## 2015-01-09 NOTE — Progress Notes (Signed)
ANTICOAGULATION CONSULT NOTE - Follow Up Consult  Pharmacy Consult for Heparin  Indication: chest pain/ACS  Allergies  Allergen Reactions  . Acetaminophen Other (See Comments)    Liver problems  . Oxycodone Itching and Nausea Only  . Ace Inhibitors Other (See Comments)  . Cefaclor Itching and Rash  . Cephalexin Itching and Rash  . Penicillins Rash  . Pregabalin Other (See Comments)    Retains fluid  . Tape Itching and Rash    Takes skin right off with medical tape    Patient Measurements: Height: 5\' 2"  (157.5 cm) IBW/kg (Calculated) : 50.1  Vital Signs: Temp: 98.4 F (36.9 C) (11/10 2025) Temp Source: Oral (11/10 2025) BP: 109/51 mmHg (11/10 2251) Pulse Rate: 73 (11/10 2025)  Labs:  Recent Labs  01/08/15 1710 01/08/15 2321  HGB 13.4  --   HCT 43.9  --   PLT 105*  --   HEPARINUNFRC  --  0.31  CREATININE 0.87  --   TROPONINI <0.03 <0.03    Estimated Creatinine Clearance: 70.2 mL/min (by C-G formula based on Cr of 0.87).  Assessment: Heparin level on low end of therapeutic range, plans for cath today  Goal of Therapy:  Heparin level 0.3-0.7 units/ml Monitor platelets by anticoagulation protocol: Yes   Plan:  -Increase heparin slightly to 950 units/hr to help prevent low level -Confirmatory HL with AM labs  Narda Bonds 01/09/2015,12:45 AM

## 2015-01-09 NOTE — Progress Notes (Addendum)
Subjective: No further pain  Objective: Vital signs in last 24 hours: Temp:  [98 F (36.7 C)-98.4 F (36.9 C)] 98.4 F (36.9 C) (11/11 0500) Pulse Rate:  [73-81] 81 (11/11 0500) Resp:  [18-20] 20 (11/11 0500) BP: (90-148)/(33-88) 146/69 mmHg (11/11 0500) SpO2:  [94 %-95 %] 95 % (11/11 0500) Weight:  [217 lb 14.4 oz (98.839 kg)-219 lb 9.6 oz (99.61 kg)] 217 lb 14.4 oz (98.839 kg) (11/11 0500) Weight change:  Last BM Date: 01/07/15 Intake/Output from previous day: 11/10 0701 - 11/11 0700 In: 623.5 [P.O.:360; I.V.:263.5] Out: -  Intake/Output this shift:    PE: General:Pleasant affect, NAD Skin:Warm and dry, brisk capillary refill HEENT:normocephalic, sclera clear, mucus membranes moist Neck:supple, no JVD, no bruits  Heart:S1S2 RRR without murmur, gallup, rub or click Lungs:clear without rales, rhonchi, or wheezes FAO:ZHYQ, non tender, + BS, do not palpate liver spleen or masses Ext:no lower ext edema, 2+ pedal pulses, 2+ radial pulses Neuro:alert and oriented, MAE, follows commands, + facial symmetry Tele:  SR   Lab Results:  Recent Labs  01/08/15 1710 01/09/15 0455  WBC 6.0 4.0  HGB 13.4 12.2  HCT 43.9 39.4  PLT 105* 78*   BMET  Recent Labs  01/08/15 1710 01/09/15 0455  NA 143 142  K 4.6 3.7  CL 103 105  CO2 29 29  GLUCOSE 98 128*  BUN 5* 8  CREATININE 0.87 0.85  CALCIUM 9.8 9.0    Recent Labs  01/08/15 2321 01/09/15 0455  TROPONINI <0.03 <0.03    Lab Results  Component Value Date   CHOL 135 01/09/2015   HDL 37* 01/09/2015   LDLCALC 74 01/09/2015   TRIG 119 01/09/2015   CHOLHDL 3.6 01/09/2015     Hepatic Function Panel  Recent Labs  01/08/15 1710  PROT 8.1  ALBUMIN 3.8  AST 55*  ALT 22  ALKPHOS 92  BILITOT 0.9    Recent Labs  01/09/15 0455  CHOL 135   No results for input(s): PROTIME in the last 72 hours.     Studies/Results: No results found.  Medications: I have reviewed the patient's current  medications. Scheduled Meds: . aspirin EC  81 mg Oral Daily  . atorvastatin  20 mg Oral q1800  . ezetimibe  10 mg Oral QHS  . FLUoxetine  40 mg Oral Daily  . irbesartan  150 mg Oral Daily  . pantoprazole  40 mg Oral Daily  . propranolol ER  120 mg Oral Daily  . sodium chloride  3 mL Intravenous Q12H   Continuous Infusions: . sodium chloride 10 mL/hr at 01/09/15 0609  . heparin 1,050 Units/hr (01/09/15 0850)  . nitroGLYCERIN 10 mcg/min (01/08/15 1847)   PRN Meds:.sodium chloride, acetaminophen, albuterol, nitroGLYCERIN, ondansetron (ZOFRAN) IV, sodium chloride  Assessment/Plan: 64 year old female, followed by Dr. Burt Knack, with a past medical history significant for CAD with CABG in 2001, hypertension, hyperlipidemia and diabetes. She has required coronary stenting and Cutting Balloon angioplasty. She has a long Taxus drug-eluting stent used to treat in-stent restenosis in the left circumflex. She had been maintained on long-term dual antiplatelet therapy. 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 She has undergone banding. Plavix was discontinued but she was continued on ASA. Her last echocardiogram was done in 2012 and her left ventricular ejection fraction was low normal at 50% with inferolateral and posterior wall hypokinesis. Last cath 11/2013 two-vessel coronary artery disease with minimal stenosis of the left main and  LAD. There were patent stents in the left circumflex and total occlusion of a nondominant RCA. She had continued patency of the saphenous vein graft to the diagonal, saphenous vein graft to the acute marginal branch of the RCA and a free RIMA to the distal left circumflex. Her anatomy was felt to be stable compared to prior cath. Medical therapy was elected. Moderate segmental systolic dysfunction was also noted at that time with EF estimated at 40-45%.  Now with unstable angina with new T wave inversions in II,III, V4-V6.[name]  On IV heparin  and IV NTG for cath today.  Principal Problem:   Unstable angina ([place]) neg MI for cath today.  Relief with IV heparin and NTG  Active Problems:   Mixed hyperlipidemia   CAD, NATIVE VESSEL- grafts patent on last cath 2015.   Chronic combined systolic and diastolic CHF, NYHA class 2 (HCC)   Thrombocytopenia 105 on admit have been decreased since at least 2010    Cirrhosis non alcoholic.   LOS: 1 day   Time spent with pt. :15 minutes. [facility] R  Nurse Practitioner Certified Pager [phone] or after 5pm and on weekends call [phone] 01/09/2015, 8:50 AM   Agree with note written by Cecilie Kicks RNP  Known CAD. H/O CABG. Recurrent Sx c/w accelerated angina . New inferolateral TWI. For cor angio this am. Exam benign. Labs ok except for low plts (78K) which appears to be chronic. Prob OK to cath .  Quay Burow 01/09/2015 9:34 AM

## 2015-01-09 NOTE — Progress Notes (Signed)
ANTICOAGULATION CONSULT NOTE - Follow Up Consult  Pharmacy Consult for Heparin  Indication: chest pain/ACS  Allergies  Allergen Reactions  . Acetaminophen Other (See Comments)    Liver problems  . Oxycodone Itching and Nausea Only  . Ace Inhibitors Other (See Comments)  . Cefaclor Itching and Rash  . Cephalexin Itching and Rash  . Penicillins Rash  . Pregabalin Other (See Comments)    Retains fluid  . Tape Itching and Rash    Takes skin right off with medical tape    Patient Measurements: Height: 5\' 2"  (157.5 cm) Weight: 217 lb 14.4 oz (98.839 kg) IBW/kg (Calculated) : 50.1  Vital Signs: Temp: 98.4 F (36.9 C) (11/11 0500) Temp Source: Oral (11/11 0500) BP: 146/69 mmHg (11/11 0500) Pulse Rate: 81 (11/11 0500)  Labs:  Recent Labs  01/08/15 1710 01/08/15 2321 01/09/15 0455  HGB 13.4  --  12.2  HCT 43.9  --  39.4  PLT 105*  --  78*  LABPROT  --   --  15.8*  INR  --   --  1.24  HEPARINUNFRC  --  0.31 0.23*  CREATININE 0.87  --  0.85  TROPONINI <0.03 <0.03 <0.03    Estimated Creatinine Clearance: 71.5 mL/min (by C-G formula based on Cr of 0.85).  Assessment: 64 yo female admitted with [place], started on IV heparin.  Heparin level is below therapeutic range this AM.  CBC stable, platelet count down a little from baseline 105 > 78.  No bleeding or complications noted.  Per RN, should go to cath lab ~ 1030 today.  Goal of Therapy:  Heparin level 0.3-0.7 units/ml Monitor platelets by anticoagulation protocol: Yes   Plan:  -Increase heparin to 1050 units/hr. -F/u plans for IV heparin after cath lab.  Uvaldo Rising, BCPS  Clinical Pharmacist Pager [phone]  01/09/2015 8:38 AM

## 2015-01-10 ENCOUNTER — Other Ambulatory Visit: Payer: Self-pay

## 2015-01-10 LAB — BASIC METABOLIC PANEL
Anion gap: 8 (ref 5–15)
BUN: 8 mg/dL (ref 6–20)
CO2: 25 mmol/L (ref 22–32)
Calcium: 8.8 mg/dL — ABNORMAL LOW (ref 8.9–10.3)
Chloride: 106 mmol/L (ref 101–111)
Creatinine, Ser: 0.73 mg/dL (ref 0.44–1.00)
GFR calc Af Amer: >60 mL/min (ref >60)
GFR calc non Af Amer: >60 mL/min (ref >60)
Glucose, Bld: 126 mg/dL — ABNORMAL HIGH (ref 65–99)
Potassium: 3.4 mmol/L — ABNORMAL LOW (ref 3.5–5.1)
Sodium: 139 mmol/L (ref 135–145)

## 2015-01-10 LAB — CBC
HCT: 39.2 % (ref 36.0–46.0)
Hemoglobin: 11.8 g/dL — ABNORMAL LOW (ref 12.0–15.0)
MCH: 24.9 pg — ABNORMAL LOW (ref 26.0–34.0)
MCHC: 30.1 g/dL (ref 30.0–36.0)
MCV: 82.7 fL (ref 78.0–100.0)
Platelets: 69 10*3/uL — ABNORMAL LOW (ref 150–400)
RBC: 4.74 MIL/uL (ref 3.87–5.11)
RDW: 16.3 % — ABNORMAL HIGH (ref 11.5–15.5)
WBC: 3 10*3/uL — ABNORMAL LOW (ref 4.0–10.5)

## 2015-01-10 LAB — BRAIN NATRIURETIC PEPTIDE: B Natriuretic Peptide: 110 pg/mL — ABNORMAL HIGH (ref 0.0–100.0)

## 2015-01-10 MED ORDER — ISOSORBIDE DINITRATE 10 MG PO TABS
10.0000 mg | ORAL_TABLET | Freq: Three times a day (TID) | ORAL | Status: DC
  Administered 2015-01-10 – 2015-01-11 (×4): 10 mg via ORAL
  Filled 2015-01-10 (×4): qty 1

## 2015-01-10 MED ORDER — FUROSEMIDE 10 MG/ML IJ SOLN
40.0000 mg | Freq: Two times a day (BID) | INTRAMUSCULAR | Status: DC
  Administered 2015-01-10 – 2015-01-11 (×2): 40 mg via INTRAVENOUS
  Filled 2015-01-10 (×2): qty 4

## 2015-01-10 MED ORDER — CARVEDILOL 3.125 MG PO TABS
3.1250 mg | ORAL_TABLET | Freq: Two times a day (BID) | ORAL | Status: DC
  Administered 2015-01-11 – 2015-01-13 (×5): 3.125 mg via ORAL
  Filled 2015-01-10 (×5): qty 1

## 2015-01-10 MED ORDER — POTASSIUM CHLORIDE ER 10 MEQ PO TBCR
20.0000 meq | EXTENDED_RELEASE_TABLET | Freq: Two times a day (BID) | ORAL | Status: DC
Start: 2015-01-10 — End: 2015-01-13
  Administered 2015-01-10 – 2015-01-13 (×7): 20 meq via ORAL
  Filled 2015-01-10 (×15): qty 2

## 2015-01-10 MED ORDER — ENOXAPARIN SODIUM 40 MG/0.4ML ~~LOC~~ SOLN: 40.0000 mg | SUBCUTANEOUS | Status: DC

## 2015-01-10 MED ORDER — BENZONATATE 100 MG PO CAPS
100.0000 mg | ORAL_CAPSULE | Freq: Three times a day (TID) | ORAL | Status: AC | PRN
Start: 2015-01-10 — End: 2015-01-11
  Administered 2015-01-10 – 2015-01-11 (×3): 100 mg via ORAL
  Filled 2015-01-10 (×3): qty 1

## 2015-01-10 MED ORDER — CALCIUM CARBONATE ANTACID 500 MG PO CHEW
1.0000 | CHEWABLE_TABLET | ORAL | Status: DC | PRN
  Administered 2015-01-10 (×2): 200 mg via ORAL
  Filled 2015-01-10: qty 1

## 2015-01-10 NOTE — Progress Notes (Signed)
   Subjective: Still SOB  Has indigestion sensation.   Objective: Filed Vitals:   01/09/15 2030 01/09/15 2230 01/10/15 0300 01/10/15 0555  BP: 122/44 127/52 137/107 154/61  Pulse: 74 76 70 66  Temp: 98.2 F (36.8 C)  98.3 F (36.8 C)   TempSrc: Oral  Oral   Resp: 18 18 18 18   Height:      Weight:    100 kg (220 lb 7.4 oz)  SpO2: 94% 95% 95% 96%   Weight change: 1.161 kg (2 lb 9 oz)  Intake/Output Summary (Last 24 hours) at 01/10/15 1114 Last data filed at 01/10/15 0747  Gross per 24 hour  Intake 1551.98 ml  Output   1200 ml  Net 351.98 ml    General: Alert, awake, oriented x3, in no acute distress Neck:  JVP is normal Heart: Regular rate and rhythm, without murmurs, rubs, gallops.  Lungs: Clear to auscultation.  No rales or wheezes. Exemities:  No edema.   Neuro: Grossly intact, nonfocal.  Tele:  SR   SOme periods irreg  Diffilcut to see p waves  Rates OK   Lab Results: Results for orders placed or performed during the hospital encounter of 01/08/15 (from the past 24 hour(s))  CBC     Status: Abnormal   Collection Time: 01/10/15  4:43 AM  Result Value Ref Range   WBC 3.0 (L) 4.0 - 10.5 K/uL   RBC 4.74 3.87 - 5.11 MIL/uL   Hemoglobin 11.8 (L) 12.0 - 15.0 g/dL   HCT 39.2 36.0 - 46.0 %   MCV 82.7 78.0 - 100.0 fL   MCH 24.9 (L) 26.0 - 34.0 pg   MCHC 30.1 30.0 - 36.0 g/dL   RDW 16.3 (H) 11.5 - 15.5 %   Platelets 69 (L) 150 - 400 K/uL  Basic metabolic panel     Status: Abnormal   Collection Time: 01/10/15  4:43 AM  Result Value Ref Range   Sodium 139 135 - 145 mmol/L   Potassium 3.4 (L) 3.5 - 5.1 mmol/L   Chloride 106 101 - 111 mmol/L   CO2 25 22 - 32 mmol/L   Glucose, Bld 126 (H) 65 - 99 mg/dL   BUN 8 6 - 20 mg/dL   Creatinine, Ser 0.73 0.44 - 1.00 mg/dL   Calcium 8.8 (L) 8.9 - 10.3 mg/dL   GFR calc non Af Amer >60 >60 mL/min   GFR calc Af Amer >60 >60 mL/min   Anion gap 8 5 - 15    Studies/Results: No results found.  Medications:  Reviewed   @PROBHOSP @  1  Dyspnea/ chest pressure  Cath yesterday showed:  High grade lesion to RCA; 50% D1, SVG to RCA patent; SVG to Diag patent  LIMA to OM atretic.  LVEF 35 to 40%  LVEDP 27  Given IV lasix 40 daily   WOuld increase to 80 bid  Strict I/O  Check BNP ( may be unreliable, as exam is)  2.  Rhythm  Will follow closlely  No definite afib    Follow     LOS: 2 days   Dorris Carnes 01/10/2015, 11:14 AM

## 2015-01-11 DIAGNOSIS — I5043 Acute on chronic combined systolic (congestive) and diastolic (congestive) heart failure: Secondary | ICD-10-CM | POA: Diagnosis present

## 2015-01-11 DIAGNOSIS — Z9861 Coronary angioplasty status: Secondary | ICD-10-CM

## 2015-01-11 DIAGNOSIS — I255 Ischemic cardiomyopathy: Secondary | ICD-10-CM | POA: Diagnosis present

## 2015-01-11 LAB — BASIC METABOLIC PANEL
Anion gap: 10 (ref 5–15)
BUN: 9 mg/dL (ref 6–20)
CO2: 24 mmol/L (ref 22–32)
Calcium: 9.3 mg/dL (ref 8.9–10.3)
Chloride: 103 mmol/L (ref 101–111)
Creatinine, Ser: 0.82 mg/dL (ref 0.44–1.00)
GFR calc Af Amer: >60 mL/min (ref >60)
GFR calc non Af Amer: >60 mL/min (ref >60)
Glucose, Bld: 155 mg/dL — ABNORMAL HIGH (ref 65–99)
Potassium: 4.1 mmol/L (ref 3.5–5.1)
Sodium: 137 mmol/L (ref 135–145)

## 2015-01-11 LAB — CBC
HCT: 39.7 % (ref 36.0–46.0)
Hemoglobin: 12.1 g/dL (ref 12.0–15.0)
MCH: 25.2 pg — ABNORMAL LOW (ref 26.0–34.0)
MCHC: 30.5 g/dL (ref 30.0–36.0)
MCV: 82.7 fL (ref 78.0–100.0)
Platelets: 73 10*3/uL — ABNORMAL LOW (ref 150–400)
RBC: 4.8 MIL/uL (ref 3.87–5.11)
RDW: 16.1 % — ABNORMAL HIGH (ref 11.5–15.5)
WBC: 4.1 10*3/uL (ref 4.0–10.5)

## 2015-01-11 MED ORDER — ZOLPIDEM TARTRATE 5 MG PO TABS
5.0000 mg | ORAL_TABLET | Freq: Once | ORAL | Status: AC
  Administered 2015-01-11: 5 mg via ORAL
  Filled 2015-01-11: qty 1

## 2015-01-11 MED ORDER — ZOLPIDEM TARTRATE 5 MG PO TABS
5.0000 mg | ORAL_TABLET | Freq: Every evening | ORAL | Status: DC | PRN
  Administered 2015-01-11 – 2015-01-12 (×2): 5 mg via ORAL
  Filled 2015-01-11 (×2): qty 1

## 2015-01-11 MED ORDER — BENZONATATE 100 MG PO CAPS
100.0000 mg | ORAL_CAPSULE | Freq: Three times a day (TID) | ORAL | Status: DC | PRN
  Administered 2015-01-11 – 2015-01-12 (×3): 100 mg via ORAL
  Filled 2015-01-11 (×3): qty 1

## 2015-01-11 MED ORDER — FUROSEMIDE 10 MG/ML IJ SOLN
80.0000 mg | Freq: Two times a day (BID) | INTRAMUSCULAR | Status: DC
  Administered 2015-01-11 – 2015-01-13 (×4): 80 mg via INTRAVENOUS
  Filled 2015-01-11 (×4): qty 8

## 2015-01-11 NOTE — Progress Notes (Signed)
    Subjective:  SOB improved but she says she is still dyspneic going to BR    Objective:  Vital Signs in the last 24 hours: Temp:  [98 F (36.7 C)-98.5 F (36.9 C)] 98.5 F (36.9 C) (11/13 0400) Pulse Rate:  [65-78] 67 (11/13 0929) Resp:  [17-22] 22 (11/13 0400) BP: (121-150)/(41-61) 121/41 mmHg (11/13 0929) SpO2:  [94 %-97 %] 96 % (11/13 0400) Weight:  [217 lb 1.6 oz (98.476 kg)] 217 lb 1.6 oz (98.476 kg) (11/13 0400)  Intake/Output from previous day:  Intake/Output Summary (Last 24 hours) at 01/11/15 0939 Last data filed at 01/11/15 0825  Gross per 24 hour  Intake   1080 ml  Output   4300 ml  Net  -3220 ml    Physical Exam: General appearance: alert, cooperative, no distress and morbidly obese Neck: no JVD Lungs: clear to auscultation bilaterally Heart: regular rate and rhythm Extremities: trace edema RLE   Rate: 68  Rhythm: normal sinus rhythm  Lab Results:  Recent Labs  01/10/15 0443 01/11/15 0326  WBC 3.0* 4.1  HGB 11.8* 12.1  PLT 69* 73*    Recent Labs  01/09/15 0455 01/10/15 0443  NA 142 139  K 3.7 3.4*  CL 105 106  CO2 29 25  GLUCOSE 128* 126*  BUN 8 8  CREATININE 0.85 0.73    Recent Labs  01/08/15 2321 01/09/15 0455  TROPONINI <0.03 <0.03    Recent Labs  01/09/15 0455  INR 1.24    Scheduled Meds: . aspirin  81 mg Oral Daily  . aspirin EC  81 mg Oral Daily  . atorvastatin  20 mg Oral q1800  . carvedilol  3.125 mg Oral BID WC  . enoxaparin (LOVENOX) injection  40 mg Subcutaneous Q24H  . ezetimibe  10 mg Oral QHS  . FLUoxetine  40 mg Oral Daily  . furosemide  40 mg Intravenous BID  . irbesartan  150 mg Oral Daily  . isosorbide dinitrate  10 mg Oral TID  . pantoprazole  40 mg Oral Daily  . potassium chloride  20 mEq Oral BID   Continuous Infusions:  PRN Meds:.acetaminophen, albuterol, benzonatate, calcium carbonate, nitroGLYCERIN, ondansetron (ZOFRAN) IV, ondansetron (ZOFRAN) IV   Imaging: Imaging results have been  reviewed   Assessment/Plan:   Principal Problem:   Unstable angina (HCC) Active Problems:   CAD S/P prior CFX PCI with DES   S/P CABG x 3- 2001   Acute on chronic combined systolic and diastolic heart failure (HCC)   Essential hypertension   Chronic combined systolic and diastolic CHF, NYHA class 2 (HCC)   Cardiomyopathy, ischemic-35-40% by cath    Mixed hyperlipidemia   Esophageal varices- Plavix stopped   Cirrhosis, non-alcoholic ([place])   Obesity  Kerin Ransom PA-C 01/11/2015, 9:39 AM [phone]  Patient seen and examined  I ssaw her yesterday Cath on Fri LVEDP was 27  Exam yesterday:  Lungs CTA  Some LE edema BNP 110   Exam does not help   I would continue IV lasix Increase to 80 bid IV  Check BMET   She is a little better with breathing but still SOB   Follow BUN/Cr   Continue ARB and b blocker Pt admits to eating extra salt   WIll have dietary see.    HR is regular on tele  No arrhythmia    Plt low  Hix of cirrhosis

## 2015-01-12 DIAGNOSIS — I255 Ischemic cardiomyopathy: Secondary | ICD-10-CM

## 2015-01-12 DIAGNOSIS — K7460 Unspecified cirrhosis of liver: Secondary | ICD-10-CM

## 2015-01-12 LAB — CBC
HCT: 39 % (ref 36.0–46.0)
Hemoglobin: 11.9 g/dL — ABNORMAL LOW (ref 12.0–15.0)
MCH: 25.2 pg — ABNORMAL LOW (ref 26.0–34.0)
MCHC: 30.5 g/dL (ref 30.0–36.0)
MCV: 82.6 fL (ref 78.0–100.0)
Platelets: 80 10*3/uL — ABNORMAL LOW (ref 150–400)
RBC: 4.72 MIL/uL (ref 3.87–5.11)
RDW: 16.2 % — ABNORMAL HIGH (ref 11.5–15.5)
WBC: 4.8 10*3/uL (ref 4.0–10.5)

## 2015-01-12 MED ORDER — ISOSORBIDE MONONITRATE ER 30 MG PO TB24
30.0000 mg | ORAL_TABLET | Freq: Every day | ORAL | Status: DC
  Administered 2015-01-12 – 2015-01-13 (×2): 30 mg via ORAL
  Filled 2015-01-12 (×2): qty 1

## 2015-01-12 NOTE — Care Management Important Message (Signed)
Important Message  Patient Details  Name: Samantha [name] MRN: [id number] Date of Birth: 03-16-1950   Medicare Important Message Given:  Yes    Rokia Bosket P Johniya Durfee 01/12/2015, 2:53 PM

## 2015-01-12 NOTE — Plan of Care (Signed)
Problem: Activity: Goal: Risk for activity intolerance will decrease Outcome: Progressing Pt a/o X 3 able to communicate needs and discomfort. Will continue to monitor pt status and provide emotional support as needed.

## 2015-01-12 NOTE — Progress Notes (Signed)
Subjective:  SOB improving  Objective:  Vital Signs in the last 24 hours: Temp:  [98.3 F (36.8 C)-98.5 F (36.9 C)] 98.4 F (36.9 C) (11/14 0500) Pulse Rate:  [63-75] 63 (11/14 0500) Resp:  [15-18] 15 (11/14 0500) BP: (109-138)/(36-52) 126/36 mmHg (11/14 0500) SpO2:  [94 %-96 %] 96 % (11/14 0500) Weight:  [217 lb 6.4 oz (98.612 kg)] 217 lb 6.4 oz (98.612 kg) (11/14 0500)  Intake/Output from previous day:  Intake/Output Summary (Last 24 hours) at 01/12/15 0811 Last data filed at 01/12/15 0500  Gross per 24 hour  Intake   1050 ml  Output   2425 ml  Net  -1375 ml    Physical Exam: General appearance: alert, cooperative, no distress and morbidly obese Neck: no JVD Lungs: clear to auscultation bilaterally Heart: regular rate and rhythm Extremities: trace edema RLE   Rate: 68  Rhythm: normal sinus rhythm, PVCs, 3bt NSVT  Lab Results:  Recent Labs  01/11/15 0326 01/12/15 0238  WBC 4.1 4.8  HGB 12.1 11.9*  PLT 73* 80*    Recent Labs  01/10/15 0443 01/11/15 1112  NA 139 137  K 3.4* 4.1  CL 106 103  CO2 25 24  GLUCOSE 126* 155*  BUN 8 9  CREATININE 0.73 0.82   No results for input(s): TROPONINI in the last 72 hours.  Invalid input(s): CK, MB No results for input(s): INR in the last 72 hours.  Scheduled Meds: . aspirin  81 mg Oral Daily  . aspirin EC  81 mg Oral Daily  . atorvastatin  20 mg Oral q1800  . carvedilol  3.125 mg Oral BID WC  . enoxaparin (LOVENOX) injection  40 mg Subcutaneous Q24H  . ezetimibe  10 mg Oral QHS  . FLUoxetine  40 mg Oral Daily  . furosemide  80 mg Intravenous BID  . irbesartan  150 mg Oral Daily  . isosorbide dinitrate  10 mg Oral TID  . pantoprazole  40 mg Oral Daily  . potassium chloride  20 mEq Oral BID   Continuous Infusions:  PRN Meds:.acetaminophen, albuterol, benzonatate, calcium carbonate, nitroGLYCERIN, ondansetron (ZOFRAN) IV, ondansetron (ZOFRAN) IV, zolpidem   Imaging: Imaging results have been  reviewed   Assessment/Plan:  64 year old obese, Caucasian female, followed by Dr. Burt Knack, with a PMH of CAD, hypertension, hyperlipidemia and diabetes. In 2001 she underwent CABG x3. Since that time she has required coronary stenting and Cutting Balloon angioplasty. She has a long Taxus DES used to treat in-stent restenosis in the left circumflex. 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 Plavix was discontinued, she was continued on ASA.   She underwent a repeat cardiac catheterization October 2015 by Dr. Burt Knack revealing two-vessel coronary artery disease with minimal stenosis of the left main and LAD. There were patent stents in the left circumflex and total occlusion of a nondominant RCA. She had continued patency of the SVG-Dxl, SVG-AM, and a free RIMA to the distal CFX. Her anatomy was felt to be stable compared to prior cath. Medical therapy was elected. EF estimated at 40-45%.   She is admitted now with chest pain and DOE. She was cathed again 01/09/15- no change from Oct 2015 except EF now 35-40%. Troponin negative, chest pain felt to be secondary to CHF. She has improved with diuresis (BNP only 110). It has been hard to tell volume status by exam secondary to obesity. Lasix increased yesterday to 80 mg IV BID- I/O negative 2L overnight.  Principal Problem:   Unstable angina- secondary to CHF, Troponin negative Active Problems:   Acute on chronic combined systolic and diastolic heart failure (HCC)   CAD S/P prior CFX PCI with DES   Essential hypertension   S/P CABG x 3- 2001   Chronic combined systolic and diastolic CHF, NYHA class 2 (HCC)   Cardiomyopathy, ischemic-35-40% by cath    Mixed hyperlipidemia   Esophageal varices- Plavix stopped   Cirrhosis, non-alcoholic ([place])   Obesity   Plan: Symptomatically improved though no real change in wgt. BNP probably not a good indicator of volume status. ? Continue IV Lasix till  BUN/SCr bumps.   On Isordil TID- will change to Imdur 30 mg  Consider addition of low dose Aldactone   Kerin Ransom PA-C 01/12/2015, 8:11 AM [phone]   I have examined the patient and reviewed assessment and plan and discussed with patient.  Agree with above as stated.  Continue higher dose IV lasix.  Sx better today.  Adjust dose based on Cr tomorrow morning.  Difficult to assess volume status by exam.  May need a higher dose of home Lasix at discharge.  Avoid salt as well. Await nutrition consult as well.  Aldactone based on tomorrow AM results.  Odie Rauen S.

## 2015-01-12 NOTE — Care Management Note (Addendum)
Case Management Note  Patient Details  Name: Samantha [name] MRN: [id number] Date of Birth: Sep 09, 1950  Subjective/Objective:   Pt admitted for unstable angina. S/p cath 01-09-15. Continues on IV lasix.                  Action/Plan:Pt may benefit from [facility] services once completed.   Expected Discharge Date:                  Expected Discharge Plan:  [place]  In-House Referral:     Discharge planning Services  CM Consult  Post Acute Care Choice:  Pt is not homebound. Choice offered to:   NA  DME Arranged:   NA DME Agency:   NA  HH Arranged:   NA HH Agency:   NA  Status of Service: Completed.  Medicare Important Message Given:  Yes Date Medicare IM Given:    Medicare IM give by:    Date Additional Medicare IM Given:    Additional Medicare Important Message give by:     If discussed at [place] of Stay Meetings, dates discussed:    Additional Comments: 1101 01-13-15 Jacqlyn Krauss, RN,BSN [phone] CM did speak with pt in regards to [place]. Per pt she will not need any services- pt is not homebound. No further needs from CM at this time.   Bethena Roys, RN 01/12/2015, 4:05 PM

## 2015-01-13 ENCOUNTER — Telehealth: Payer: Self-pay | Admitting: Cardiovascular Disease

## 2015-01-13 DIAGNOSIS — I10 Essential (primary) hypertension: Secondary | ICD-10-CM

## 2015-01-13 DIAGNOSIS — E669 Obesity, unspecified: Secondary | ICD-10-CM

## 2015-01-13 LAB — BASIC METABOLIC PANEL
Anion gap: 8 (ref 5–15)
BUN: 8 mg/dL (ref 6–20)
CO2: 31 mmol/L (ref 22–32)
Calcium: 9.2 mg/dL (ref 8.9–10.3)
Chloride: 102 mmol/L (ref 101–111)
Creatinine, Ser: 0.81 mg/dL (ref 0.44–1.00)
GFR calc Af Amer: >60 mL/min (ref >60)
GFR calc non Af Amer: >60 mL/min (ref >60)
Glucose, Bld: 125 mg/dL — ABNORMAL HIGH (ref 65–99)
Potassium: 3.5 mmol/L (ref 3.5–5.1)
Sodium: 141 mmol/L (ref 135–145)

## 2015-01-13 LAB — CBC
HCT: 40.1 % (ref 36.0–46.0)
Hemoglobin: 12.3 g/dL (ref 12.0–15.0)
MCH: 25.4 pg — ABNORMAL LOW (ref 26.0–34.0)
MCHC: 30.7 g/dL (ref 30.0–36.0)
MCV: 82.7 fL (ref 78.0–100.0)
Platelets: 77 10*3/uL — ABNORMAL LOW (ref 150–400)
RBC: 4.85 MIL/uL (ref 3.87–5.11)
RDW: 16 % — ABNORMAL HIGH (ref 11.5–15.5)
WBC: 3.6 10*3/uL — ABNORMAL LOW (ref 4.0–10.5)

## 2015-01-13 MED ORDER — CARVEDILOL 3.125 MG PO TABS: 3.1250 mg | ORAL_TABLET | Freq: Two times a day (BID) | ORAL | Status: DC

## 2015-01-13 MED ORDER — FUROSEMIDE 40 MG PO TABS: 80.0000 mg | ORAL_TABLET | Freq: Every day | ORAL | Status: DC

## 2015-01-13 MED ORDER — POTASSIUM CHLORIDE ER 10 MEQ PO TBCR: 10.0000 meq | EXTENDED_RELEASE_TABLET | Freq: Every day | ORAL | Status: DC

## 2015-01-13 NOTE — Discharge Summary (Signed)
CARDIOLOGY DISCHARGE SUMMARY   Patient ID: Samantha [name] MRN: [id number] DOB/AGE: April 23, 1950 64 y.o.  Admit date: 01/08/2015 Discharge date: 01/13/2015  PCP: [name], MD Primary Cardiologist: Dr Burt Knack  Primary Discharge Diagnosis:    Acute on chronic combined systolic and diastolic heart failure ([place]) - weight at d/c 215 lbs  Secondary Discharge Diagnosis:  Admitting Problem:   Unstable angina- secondary to CHF, Troponin negative Active Problems:   Mixed hyperlipidemia   CAD S/P prior CFX PCI with DES   Essential hypertension   S/P CABG x 3- 2001   Esophageal varices- Plavix stopped   Chronic combined systolic and diastolic CHF, NYHA class 2 (HCC)   Cirrhosis, non-alcoholic (HCC)   Obesity   Cardiomyopathy, ischemic-35-40% by cath    Thrombocytopenia  Procedures: Cardiac catheterization, coronary arteriogram, IMA arteriogram, left ventriculogram  Hospital Course: Samantha [name] is a 65 y.o. female with a history of CABG 2001 and subsequent PCI, hypertension, hyperlipidemia, obesity, esophageal varices requiring banding, and diabetes. EF 40-45% at cath 2015, medical therapy recommended.   She was seen in the office on the day of admission and was having intermittent chest pain, that felt like her previous angina. It was radiating up into her neck and arm and not relieved by GI medications. Sublingual nitroglycerin helped. Her ECG had new T-wave inversions in the inferior and lateral leads She was admitted for further evaluation and treatment.  Her cardiac enzymes were negative for MI. She was taken to the cath lab on 01/09/2015. Results are below, medical therapy was recommended for CAD but there were no obvious culprit lesions. Medical therapy and cardiac risk factor reduction were recommended.  Her LVEDP was elevated at 27. She was having dyspnea along with the chest pressure. She was started on IV Lasix. Her BNP is not very elevated, but this is unreliable  secondary to obesity and diastolic dysfunction. With diuresis, but the shortness of breath and chest pain improved.  Her renal function and electrolytes were followed closely with diuresis. She admitted to dietary indiscretions and was educated on avoiding sodium in her diet.  On 01/13/2015, she was evaluated by Dr. Beau Fanny and all data were reviewed. Her platelet count is low but stable. Her potassium has required supplementation at times with diuresis, but is currently within normal limits. Her renal function is unchanged, tolerating diuresis well. Dr. Beau Fanny felt that she was stable for discharge, on an increased dose of Lasix, with early outpatient follow-up. With the increased Lasix, she will also be on potassium supplement.  Consider Entresto at follow-up.  Labs:   Lab Results  Component Value Date   WBC 3.6* 01/13/2015   HGB 12.3 01/13/2015   HCT 40.1 01/13/2015   MCV 82.7 01/13/2015   PLT 77* 01/13/2015    Recent Labs Lab 01/08/15 1710  01/13/15 0349  NA 143  < > 141  K 4.6  < > 3.5  CL 103  < > 102  CO2 29  < > 31  BUN 5*  < > 8  CREATININE 0.87  < > 0.81  CALCIUM 9.8  < > 9.2  PROT 8.1  --   --   BILITOT 0.9  --   --   ALKPHOS 92  --   --   ALT 22  --   --   AST 55*  --   --   GLUCOSE 98  < > 125*  < > = values in this interval not displayed.  Lab Results  Component Value Date   TROPONINI <0.03 01/09/2015   Lipid Panel     Component Value Date/Time   CHOL 135 01/09/2015 0455   TRIG 119 01/09/2015 0455   HDL 37* 01/09/2015 0455   CHOLHDL 3.6 01/09/2015 0455   VLDL 24 01/09/2015 0455   LDLCALC 74 01/09/2015 0455    B NATRIURETIC PEPTIDE  Date/Time Value Ref Range Status  01/10/2015 12:37 PM 110.0* 0.0 - 100.0 pg/mL Final   Cardiac Cath: 01/09/2015         The left ventricular ejection fraction is 35-45% by visual estimate  EKG: 01/10/2015 Sinus rhythm, first-degree AV block LVH with QRS widening Vent. rate 73 BPM PR interval 222 ms QRS  duration 132 ms QT/QTc 458/504 ms P-R-T axes 96 75 131  FOLLOW UP PLANS AND APPOINTMENTS Allergies  Allergen Reactions  . Acetaminophen Other (See Comments)    Liver problems  . Oxycodone Itching and Nausea Only  . Ace Inhibitors Other (See Comments)  . Cefaclor Itching and Rash  . Cephalexin Itching and Rash  . Penicillins Rash  . Pregabalin Other (See Comments)    Retains fluid  . Tape Itching and Rash    Takes skin right off with medical tape     Medication List    STOP taking these medications        propranolol ER 120 MG 24 hr capsule  Commonly known as:  INDERAL LA      TAKE these medications        albuterol 108 (90 BASE) MCG/ACT inhaler  Commonly known as:  PROVENTIL HFA;VENTOLIN HFA  Inhale 1-2 puffs into the lungs every 6 (six) hours as needed for wheezing or shortness of breath.     aspirin EC 81 MG EC tablet  Generic drug:  aspirin  Take 81 mg by mouth daily. Swallow whole.     atorvastatin 20 MG tablet  Commonly known as:  LIPITOR  Take 20 mg by mouth daily at 6 PM.     carvedilol 3.125 MG tablet  Commonly known as:  COREG  Take 1 tablet (3.125 mg total) by mouth 2 (two) times daily with a meal.     cetirizine 10 MG tablet  Commonly known as:  ZYRTEC  Take 10 mg by mouth daily as needed for allergies.     clonazePAM 0.5 MG tablet  Commonly known as:  KLONOPIN  Take 0.5 mg by mouth at bedtime.     ezetimibe 10 MG tablet  Commonly known as:  ZETIA  Take 10 mg by mouth at bedtime.     FLUoxetine 40 MG capsule  Commonly known as:  PROZAC  Take 40 mg by mouth daily.     furosemide 40 MG tablet  Commonly known as:  LASIX  Take 2 tablets (80 mg total) by mouth daily. Take 2 tabs daily, OK to take 1 extra tablet daily for weight gain of 2 lbs overnight.  Notes to Patient:  If you need the extra 40 mg, take it about 2-3 pm, to limit frequent urination overnight.     HYDROcodone-acetaminophen 10-325 MG tablet  Commonly known as:  NORCO  Take 1  tablet by mouth every 6 (six) hours as needed (for cough also).     isosorbide mononitrate 30 MG CR tablet  Commonly known as:  IMDUR  Take 30 mg by mouth daily.     NITROSTAT 0.4 MG SL tablet  Generic drug:  nitroGLYCERIN  Place 0.4 mg under  the tongue every 5 (five) minutes as needed for chest pain (x 3 tabs daily).     omeprazole 40 MG capsule  Commonly known as:  PRILOSEC  Take 40 mg by mouth daily.     potassium chloride 10 MEQ tablet  Commonly known as:  K-DUR  Take 1 tablet (10 mEq total) by mouth daily.     valsartan 160 MG tablet  Commonly known as:  DIOVAN  Take 80-160 mg by mouth 2 (two) times daily. Take one tab in the morning and 1/2 tab at bed time.         Follow-up Information    Follow up with Lyda Jester, PA-C On 01/29/2015.   Specialties:  Cardiology, Radiology   Why:  See provider at 10:00 am, please arrive 15 minutes early for paperwork.    Contact information:   [address] [place] [address] [phone]       BRING ALL MEDICATIONS WITH YOU TO FOLLOW UP APPOINTMENTS  Time spent with patient to include physician time: 42 min Signed: Rosaria Ferries, PA-C 01/13/2015, 10:25 AM Co-Sign MD  I have examined the patient and reviewed assessment and plan and discussed with patient.  Agree with above as stated.   Diuresed well.  SHOB resolved.  Increase regular Lasix.  Can increase based on home weight as outlined in my note from earlier.  If weight increases by 2 > 2 lbs in a day, then she can take an extra 40 mg dose in addition to teh scheduled 80 mg that she is taking.  F/u in the next few weeks.  Daily weights and salt restriction are crucial.    Anahla Bevis S.

## 2015-01-13 NOTE — Telephone Encounter (Signed)
TCM  2 week follow up per Suanne Marker  Scheduled on 12/01 @ 10am

## 2015-01-13 NOTE — Telephone Encounter (Signed)
TCM patient.  Pt anticipated discharge from the hospital today 01/13/15.  Triage to follow-up with the pt for a TCM call tomorrow 01/14/15.

## 2015-01-13 NOTE — Plan of Care (Signed)
Problem: Activity: Goal: Risk for activity intolerance will decrease Outcome: Progressing Pt up walking in room without assistance. Pt able to communicate needs and discomforts. Will continue to monitor pt status and provide emotional support as needed

## 2015-01-13 NOTE — Progress Notes (Signed)
   SUBJECTIVE:  Shortness of breath resolved. She wants to go home.  OBJECTIVE:   Vitals:   Filed Vitals:   01/12/15 1334 01/12/15 1726 01/12/15 2100 01/13/15 0541  BP: 98/47 130/48 132/55 152/65  Pulse: 73 76 83 86  Temp: 98.6 F (37 C)  98.3 F (36.8 C) 98.3 F (36.8 C)  TempSrc: Oral  Oral Oral  Resp: 15  20 18   Height:      Weight:    215 lb 1.6 oz (97.569 kg)  SpO2: 94%  93% 95%   I&O's:   Intake/Output Summary (Last 24 hours) at 01/13/15 0942 Last data filed at 01/13/15 0542  Gross per 24 hour  Intake    660 ml  Output   2750 ml  Net  -2090 ml   TELEMETRY: Reviewed telemetry pt in sinus rhythm:     PHYSICAL EXAM General: Well developed, well nourished, in no acute distress Head:   Normal cephalic and atramatic  Lungs:  Basilar crackles bilaterally to auscultation. Heart:  HRRR S1 S2  No JVD.   Abdomen: abdomen soft and non-tender Msk:  Back normal,  Normal strength and tone for age. Extremities:   No edema.   Neuro: Alert and oriented. Psych:  Normal affect, responds appropriately Skin: No rash   LABS: Basic Metabolic Panel:  Recent Labs  01/11/15 1112 01/13/15 0349  NA 137 141  K 4.1 3.5  CL 103 102  CO2 24 31  GLUCOSE 155* 125*  BUN 9 8  CREATININE 0.82 0.81  CALCIUM 9.3 9.2   Liver Function Tests: No results for input(s): AST, ALT, ALKPHOS, BILITOT, PROT, ALBUMIN in the last 72 hours. No results for input(s): LIPASE, AMYLASE in the last 72 hours. CBC:  Recent Labs  01/12/15 0238 01/13/15 0349  WBC 4.8 3.6*  HGB 11.9* 12.3  HCT 39.0 40.1  MCV 82.6 82.7  PLT 80* 77*   Cardiac Enzymes: No results for input(s): CKTOTAL, CKMB, CKMBINDEX, TROPONINI in the last 72 hours. BNP: Invalid input(s): POCBNP D-Dimer: No results for input(s): DDIMER in the last 72 hours. Hemoglobin A1C: No results for input(s): HGBA1C in the last 72 hours. Fasting Lipid Panel: No results for input(s): CHOL, HDL, LDLCALC, TRIG, CHOLHDL, LDLDIRECT in the  last 72 hours. Thyroid Function Tests: No results for input(s): TSH, T4TOTAL, T3FREE, THYROIDAB in the last 72 hours.  Invalid input(s): FREET3 Anemia Panel: No results for input(s): VITAMINB12, FOLATE, FERRITIN, TIBC, IRON, RETICCTPCT in the last 72 hours. Coag Panel:   Lab Results  Component Value Date   INR 1.24 01/09/2015   INR 1.1* 12/23/2013   INR 1.08 12/23/2011    RADIOLOGY: No results found.    ASSESSMENT: Kathyrn Lass:   1) acute on chronic systolic/diastolic heart failure: She has diuresed well. Her shortness of breath has resolved.  She would benefit from a higher dose of home Lasix. Will have her take 80 mg daily at home. If she notices that her weight increases by more than 2 pounds in a day, she should take an additional 40 mg in the afternoon. She is agreeable to this plan. She should have follow-up in our office in the next 1-2 weeks. She should weigh herself daily. She needs to avoid dietary salt.    Jettie Booze, MD  01/13/2015  9:42 AM

## 2015-01-14 NOTE — H&P (Signed)
01/08/2015 Samantha [name]  26-Jul-1950  [id number]  Primary Physician [name], MD Primary Cardiologist: Dr. Burt Knack   Reason for Visit/CC: Unstable Angina  HPI: Patient is a 64 year old female, followed by Dr. Burt Knack, with a past medical history significant for CAD, hypertension, hyperlipidemia and diabetes. She denies any history of tobacco abuse. In 2001 she underwent CABG surgery. Since that time, she has required coronary stenting and [place] angioplasty. She has a long Taxus drug-eluting stent used to treat in-stent restenosis in the left circumflex. She had been maintained on long-term dual antiplatelet therapy. 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 She has undergone banding. Plavix was discontinued but she was continued on ASA. Her last echocardiogram was done in 2012 and her left ventricular ejection fraction was low normal at 50% with inferolateral and posterior wall hypokinesis.   She underwent a repeat cardiac catheterization October 2015 by Dr. Burt Knack revealing two-vessel coronary artery disease with minimal stenosis of the left main and LAD. There were patent stents in the left circumflex and total occlusion of a nondominant RCA. She had continued patency of the saphenous vein graft to the diagonal, saphenous vein graft to the acute marginal branch of the RCA and a free RIMA to the distal left circumflex. Her anatomy was felt to be stable compared to prior cath. Medical therapy was elected. Moderate segmental systolic dysfunction was also noted at that time with EF estimated at 40-45%. It appears that she has not been seen by Dr. Burt Knack since that time.   She now presents to clinic with complaints of chest pain concerning for unstable angina. Symptoms have occurred off and on over the last week and feel very summer to her previous angina. She reports substernal chest pressure radiating up into her neck and left arm. It feels  like indigestion but no relief with TUMS. She has had relief with sublingual nitroglycerin. SShe reports full compliance with all of her medications including her aspirin, statin, Imdur and PPI.  She is chest pain-free in clinic however her EKG demonstrates new T-wave inversions in leads II, III, V4 through V6. Vital signs are stable.    Current Outpatient Prescriptions  Medication Sig Dispense Refill  . albuterol (PROVENTIL HFA;VENTOLIN HFA) 108 (90 BASE) MCG/ACT inhaler Inhale 1-2 puffs into the lungs every 6 (six) hours as needed for wheezing or shortness of breath.    [name] aspirin (ASPIRIN EC) 81 MG EC tablet Take 81 mg by mouth daily. Swallow whole.    [name] atorvastatin (LIPITOR) 20 MG tablet Take 20 mg by mouth daily at 6 PM.     . cetirizine (ZYRTEC) 10 MG tablet Take 10 mg by mouth daily as needed for allergies.     . clonazePAM (KLONOPIN) 0.5 MG tablet Take 0.5 mg by mouth 3 (three) times daily as needed for anxiety.    [name] ezetimibe (ZETIA) 10 MG tablet Take 10 mg by mouth at bedtime.     [name] FLUoxetine (PROZAC) 40 MG capsule Take 40 mg by mouth daily.    . furosemide (LASIX) 40 MG tablet Take 60 mg by mouth daily. Take 1 1/2 tab daily    . HYDROcodone-acetaminophen (NORCO) 10-325 MG per tablet Take 1 tablet by mouth every 6 (six) hours as needed (cough). pain    . isosorbide mononitrate (IMDUR) 30 MG CR tablet Take 30 mg by mouth daily.     [name] NITROSTAT 0.4 MG SL tablet Place 0.4 mg under the tongue every 5 (five)  minutes as needed for chest pain (x 3 tabs daily).     [name] omeprazole (PRILOSEC) 40 MG capsule Take 40 mg by mouth daily.    . propranolol ER (INDERAL LA) 120 MG 24 hr capsule Take 120 mg by mouth daily.    . valsartan (DIOVAN) 160 MG tablet Take 80-160 mg by mouth 2 (two) times daily. Take one tab in the morning and 1/2 tab at bed time.     No current facility-administered medications for this visit.     Allergies  Allergen Reactions  . Ace Inhibitors Other (See Comments)  . Cefaclor Itching and Rash  . Cephalexin Itching and Rash  . Penicillins Rash  . Pregabalin Other (See Comments)    Retains fluid  . Tape Itching and Rash    Takes skin right off with medical tape    Social History   Social History  . Marital Status: Divorced    Spouse Name: N/A  . Number of Children: N/A  . Years of Education: N/A   Occupational History  . Disabled    Social History Main Topics  . Smoking status: Former Smoker -- 0.50 packs/day for 20 years    Types: Cigarettes    Quit date: 07/21/1995  . Smokeless tobacco: Never Used  . Alcohol Use: No  . Drug Use: No  . Sexual Activity: Not on file   Other Topics Concern  . Not on file   Social History Narrative   Lives in [place] by herself.      Review of Systems: General: negative for chills, fever, night sweats or weight changes.  Cardiovascular: negative for chest pain, dyspnea on exertion, edema, orthopnea, palpitations, paroxysmal nocturnal dyspnea or shortness of breath Dermatological: negative for rash Respiratory: negative for cough or wheezing Urologic: negative for hematuria Abdominal: negative for nausea, vomiting, diarrhea, bright red blood per rectum, melena, or hematemesis Neurologic: negative for visual changes, syncope, or dizziness All other systems reviewed and are otherwise negative except as noted above.    Blood pressure 120/72, pulse 78, height 5' 2"  (1.575 m), weight 219 lb 9.6 oz (99.61 kg), last menstrual period 03/29/2011.  General appearance: alert, cooperative and no distress Neck: no carotid bruit and no JVD Lungs: clear to auscultation bilaterally Heart: regular rate and rhythm, S1, S2 normal, no murmur, click, rub or gallop Extremities: no LEE Pulses: 2+ and symmetric Skin: warm and dry Neurologic: Grossly  normal  EKG   NSR with new TWI in II, III, V4-6  ASSESSMENT AND PLAN:   1. Unstable Angina/CAD: known h/o CAD s/p CABG and stenting to left circumflex. LHC in 2015 showed patent grafts and patent LCx stent with minimal stenosis of the left main and LAD. Her symptoms are c/w unstable angina and similar to previous angina. Also nitrate responsive. She has new EKG changes concerning for ischemia. I've discuss case with Dr. Radford Pax, DOD. We will plan to admit to [facility] and plan on [facility] +/- PCI tomorrow. Will continue home meds. Check CBC, CMP and INR.   2. HTN: BP is currently well controlled. Continue home meds  3.HLD: continue statin. No recent lipid panel in the last year. Will order FLP in the am.   4. DM: recently diagnosed by PCP. Diet controlled. Continue management per primary.    PLAN Admit to [facility] for unstable angina with plans for [facility] 01/09/15.   Lyda Jester PA-C 01/08/2015 2:56 PM  Patient seen and examined with Lyda Jester, PA. We discussed all aspects of the  encounter. I agree with the assessment and plan as stated above. Patient has a history of CAD with CABG and LCx stent. She has had problems with CP in the past and last cath a year ago showed patent grafts and LCx stent. SHe now is having recurrent CP similar to prior angina now with new ST changes in the inferolateral leads. Will admit to [facility] and start IV Heparin and NTG gtts. Continue statin/ASA/BB. NPO after MN for cath in am.   Signed: Fransico Him, MD [facility] 01/08/2015

## 2015-01-14 NOTE — Telephone Encounter (Signed)
Patient contacted regarding discharge from [facility]  on 01/13/15.  Patient understands to follow up with provider Ellen Henri on 01/29/15 at 10:00 at [facility].. Patient understands discharge instructions? yes Patient understands medications and regiment? yes Patient understands to bring all medications to this visit? yes  States she is doing good.  Monitoring her wt.  This AM was 216 lbs and yesterday was 215 lbs.  States she doesn't have any swelling in ankles or SOB.  States (R) wrist (cath site) does not have any swelling or edema. Reviewed all her medications.  She did not realize she was to stop Inderal so she will stop that one.  Verbalizes understanding of all of the other medications. Will see Ellen Henri on 12/1 and will call if has any problems.

## 2015-01-29 ENCOUNTER — Encounter: Payer: Self-pay | Admitting: Cardiology

## 2015-01-29 ENCOUNTER — Ambulatory Visit (INDEPENDENT_AMBULATORY_CARE_PROVIDER_SITE_OTHER): Payer: Medicare Other | Admitting: Cardiology

## 2015-01-29 VITALS — BP 120/70 | HR 75 | Ht 62.0 in | Wt 217.0 lb

## 2015-01-29 DIAGNOSIS — I5043 Acute on chronic combined systolic (congestive) and diastolic (congestive) heart failure: Secondary | ICD-10-CM

## 2015-01-29 LAB — BASIC METABOLIC PANEL
BUN: 8 mg/dL (ref 7–25)
CO2: 24 mmol/L (ref 20–31)
Calcium: 9.5 mg/dL (ref 8.6–10.4)
Chloride: 102 mmol/L (ref 98–110)
Creat: 0.81 mg/dL (ref 0.50–0.99)
Glucose, Bld: 122 mg/dL — ABNORMAL HIGH (ref 65–99)
Potassium: 4.3 mmol/L (ref 3.5–5.3)
Sodium: 139 mmol/L (ref 135–146)

## 2015-01-29 MED ORDER — POTASSIUM CHLORIDE ER 10 MEQ PO TBCR: 10.0000 meq | EXTENDED_RELEASE_TABLET | Freq: Every day | ORAL | Status: DC

## 2015-01-29 MED ORDER — CARVEDILOL 3.125 MG PO TABS: 3.1250 mg | ORAL_TABLET | Freq: Two times a day (BID) | ORAL | Status: DC

## 2015-01-29 MED ORDER — PANTOPRAZOLE SODIUM 40 MG PO TBEC: 40.0000 mg | DELAYED_RELEASE_TABLET | Freq: Every day | ORAL | Status: DC

## 2015-01-29 NOTE — Patient Instructions (Signed)
Medication Instructions:  Your physician has recommended you make the following change in your medication:  1.  STOP the Prilosec 2.  START the Protonix 40 mg taking 1 tablet daily   Labwork: TODAY:  BMET   Testing/Procedures: None ordered  Follow-Up: Your physician recommends that you keep your scheduled follow-up appointment with Dr. Burt Knack 02/26/15.   Any Other Special Instructions Will Be Listed Below (If Applicable).  If you need a refill on your cardiac medications before your next appointment, please call your pharmacy.

## 2015-01-29 NOTE — Progress Notes (Signed)
01/29/2015 Samantha [name]   02/27/1951  [id number]  Primary Physician [name], MD Primary Cardiologist: Dr. Burt Knack   Reason for Visit/CC: [facility] f/u for Acute on Chronic CHF and Chest Pain  HPI:  Patient is a 64 year old female, followed by Dr. Burt Knack, with a past medical history significant for CAD, hypertension, hyperlipidemia and diabetes. She denies any history of tobacco abuse. In 2001 she underwent CABG surgery. Since that time, she has required coronary stenting and Cutting Balloon angioplasty. She has a long Taxus drug-eluting stent used to treat in-stent restenosis in the left circumflex. She had been maintained on long-term dual antiplatelet therapy. 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 She has undergone banding. Plavix was discontinued but she was continued on ASA. Her last echocardiogram was done in 2012 and her left ventricular ejection fraction was low normal at 50% with inferolateral and posterior wall hypokinesis.   She underwent a repeat cardiac catheterization October 2015 by Dr. Burt Knack revealing two-vessel coronary artery disease with minimal stenosis of the left main and LAD. There were patent stents in the left circumflex and total occlusion of a nondominant RCA. She had continued patency of the saphenous vein graft to the diagonal, saphenous vein graft to the acute marginal branch of the RCA and a free RIMA to the distal left circumflex. Her anatomy was felt to be stable compared to prior cath. Medical therapy was elected. Moderate segmental systolic dysfunction was also noted at that time with EF estimated at 40-45%.   She was recently seen in clinic on 01/08/15 for complaints of chest pain c/w unstable angina. She was admitted directly from the office to [facility] by Dr. Radford Pax. Her catheterization performed by Dr. Tamala Julian revealed coronary anatomy. Bypass graft anatomy and patency was unchanged compared to her prior study  performed in 2015. Pletal angiographic details are listed below. Her left ventricular systolic function was noted to be slightly reduced compared to previous measurement at 35-40%. Aggressive diuresis and continue medical therapy for CAD was recommended. Treated with IV Lasix and transitioned to a higher dose of oral Lasix. She was discharged home on 80 mg of Lasix daily and was instructed to take an extra 40 mg of Lasix if any increase in her dry weight. Her weight the day of discharge was 217 pounds.  She now presented back to clinic for post hospital follow-up. She reports that she has done fairly well since discharge. Her weight has remained stable. Her weight today in clinic is 217 pounds. She reports adherence to a low-sodium diet and compliance with daily weights. Unfortunately she continues to have some periodic chest discomfort that feels similar to indigestion. She now seems to think this is related to acid reflux. She reports that at one point she was on Nexium which controlled her reflux, however she had to discontinue this medication due to a change in her insurance and inability to afford her monthly co-pay. She is now on Prilosec which she feels is not as effective.     Current Outpatient Prescriptions  Medication Sig Dispense Refill  . albuterol (PROVENTIL HFA;VENTOLIN HFA) 108 (90 BASE) MCG/ACT inhaler Inhale 1-2 puffs into the lungs every 6 (six) hours as needed for wheezing or shortness of breath.    [name] aspirin (ASPIRIN EC) 81 MG EC tablet Take 81 mg by mouth daily. Swallow whole.    [name] atorvastatin (LIPITOR) 20 MG tablet Take 20 mg by mouth daily at 6 PM.     .  carvedilol (COREG) 3.125 MG tablet Take 1 tablet (3.125 mg total) by mouth 2 (two) times daily with a meal. 60 tablet 11  . cetirizine (ZYRTEC) 10 MG tablet Take 10 mg by mouth daily as needed for allergies.     . clonazePAM (KLONOPIN) 0.5 MG tablet Take 0.5 mg by mouth at bedtime.     [name] ezetimibe (ZETIA) 10 MG tablet Take 10  mg by mouth at bedtime.     [name] FLUoxetine (PROZAC) 40 MG capsule Take 40 mg by mouth daily.    . furosemide (LASIX) 40 MG tablet Take 2 tablets (80 mg total) by mouth daily. Take 2 tabs daily, OK to take 1 extra tablet daily for weight gain of 2 lbs overnight. 90 tablet 11  . HYDROcodone-acetaminophen (NORCO) 10-325 MG tablet Take 1 tablet by mouth every 6 (six) hours as needed (for cough also).    . isosorbide mononitrate (IMDUR) 30 MG CR tablet Take 30 mg by mouth daily.     [name] NITROSTAT 0.4 MG SL tablet Place 0.4 mg under the tongue every 5 (five) minutes as needed for chest pain (x 3 tabs daily).     [name] omeprazole (PRILOSEC) 40 MG capsule Take 40 mg by mouth daily.    . potassium chloride (K-DUR) 10 MEQ tablet Take 1 tablet (10 mEq total) by mouth daily. 30 tablet 11  . valsartan (DIOVAN) 160 MG tablet Take 80-160 mg by mouth 2 (two) times daily. Take one tab in the morning and 1/2 tab at bed time.     No current facility-administered medications for this visit.    Allergies  Allergen Reactions  . Acetaminophen Other (See Comments)    Liver problems  . Oxycodone Itching and Nausea Only  . Ace Inhibitors Other (See Comments)  . Cefaclor Itching and Rash  . Cephalexin Itching and Rash  . Penicillins Rash  . Pregabalin Other (See Comments)    Retains fluid  . Tape Itching and Rash    Takes skin right off with medical tape    Social History   Social History  . Marital Status: Divorced    Spouse Name: N/A  . Number of Children: N/A  . Years of Education: N/A   Occupational History  . Disabled    Social History Main Topics  . Smoking status: Former Smoker -- 0.50 packs/day for 20 years    Types: Cigarettes    Quit date: 07/21/1995  . Smokeless tobacco: Never Used  . Alcohol Use: No  . Drug Use: No  . Sexual Activity: Not on file   Other Topics Concern  . Not on file   Social History Narrative   Lives in [place] by herself.      LHC 01/09/15   Procedures    Left  Heart Cath and Cors/Grafts Angiography    Conclusion     Bypass graft anatomy and patency is unchanged compared to the most recent study performed in 2015. Patent saphenous vein graft to the right coronary. Patent saphenous vein graft to the diagonal. Atretic free left internal mammary graft to the circumflex marginal. The internal mammary artery was formed as a man-made-Y graft.  High-grade stenosis in the proximal native right coronary.   Widely patent proximal to mid circumflex stent, unchanged from prior study  50% ostial first diagonal, unchanged from prior study.  Widely patent left main.   Left heart failure with estimated ejection fraction 35-40% with elevated left ventricular end-diastolic pressure (27 mmHg) consistent with acute on  chronic physiology .     Review of Systems: General: negative for chills, fever, night sweats or weight changes.  Cardiovascular: negative for chest pain, dyspnea on exertion, edema, orthopnea, palpitations, paroxysmal nocturnal dyspnea or shortness of breath Dermatological: negative for rash Respiratory: negative for cough or wheezing Urologic: negative for hematuria Abdominal: negative for nausea, vomiting, diarrhea, bright red blood per rectum, melena, or hematemesis Neurologic: negative for visual changes, syncope, or dizziness All other systems reviewed and are otherwise negative except as noted above.    Blood pressure 120/70, pulse 75, height 5' 2"  (1.575 m), weight 217 lb (98.431 kg), last menstrual period 03/29/2011.  General appearance: alert, cooperative and no distress Neck: no carotid bruit and no JVD Lungs: clear to auscultation bilaterally Heart: regular rate and rhythm, S1, S2 normal, no murmur, click, rub or gallop Extremities: extremities normal, atraumatic, no cyanosis or edema Pulses: 2+ and symmetric Skin: warm and dry Neurologic: Grossly normal  EKG not performed  ASSESSMENT AND PLAN:   1. CAD: recent LHC  showed stable coronary anatomy. She did not require PCI.  Continue medical therapy.   2. Chronic Systolic CHF: volume and weight both stable. Continue daily lasix + low sodium diet and daily weights. Continue Coreg and valsartan. Will recheck BMP to assess renal function and K given increased dose of Lasix.   3. GERD: Prilosec ineffective. Change to Protonix 40 mg  4. HTN: BP is well controlled  5. HLD:  Continue statin therapy.    PLAN  Continue routine f/u with Dr. Wendee Beavers, BRITTAINY PA-C 01/29/2015 10:10 AM

## 2015-01-30 ENCOUNTER — Telehealth: Payer: Self-pay | Admitting: *Deleted

## 2015-01-30 NOTE — Telephone Encounter (Signed)
-----   Message from [place], [place] sent at 01/29/2015  4:45 PM EST ----- Renal function and potassium both normal. Continue current dose of lasix as directed.

## 2015-01-30 NOTE — Telephone Encounter (Signed)
Called pt and advised her per Ellen Henri, PA-C, that her kidney function and potassium level were both normal and to continue the current dose of lasix as directed.  Pt verbalized understanding.

## 2015-02-04 DIAGNOSIS — D649 Anemia, unspecified: Secondary | ICD-10-CM | POA: Diagnosis not present

## 2015-02-04 DIAGNOSIS — E119 Type 2 diabetes mellitus without complications: Secondary | ICD-10-CM | POA: Diagnosis not present

## 2015-02-04 DIAGNOSIS — E7800 Pure hypercholesterolemia, unspecified: Secondary | ICD-10-CM | POA: Diagnosis not present

## 2015-02-04 DIAGNOSIS — K210 Gastro-esophageal reflux disease with esophagitis: Secondary | ICD-10-CM | POA: Diagnosis not present

## 2015-02-04 DIAGNOSIS — I10 Essential (primary) hypertension: Secondary | ICD-10-CM | POA: Diagnosis not present

## 2015-02-09 DIAGNOSIS — E119 Type 2 diabetes mellitus without complications: Secondary | ICD-10-CM | POA: Diagnosis not present

## 2015-02-09 DIAGNOSIS — F330 Major depressive disorder, recurrent, mild: Secondary | ICD-10-CM | POA: Diagnosis not present

## 2015-02-09 DIAGNOSIS — D696 Thrombocytopenia, unspecified: Secondary | ICD-10-CM | POA: Diagnosis not present

## 2015-02-09 DIAGNOSIS — G4709 Other insomnia: Secondary | ICD-10-CM | POA: Diagnosis not present

## 2015-02-09 DIAGNOSIS — D72819 Decreased white blood cell count, unspecified: Secondary | ICD-10-CM | POA: Diagnosis not present

## 2015-02-09 DIAGNOSIS — I5023 Acute on chronic systolic (congestive) heart failure: Secondary | ICD-10-CM | POA: Diagnosis not present

## 2015-02-09 DIAGNOSIS — I10 Essential (primary) hypertension: Secondary | ICD-10-CM | POA: Diagnosis not present

## 2015-02-09 DIAGNOSIS — Z13220 Encounter for screening for lipoid disorders: Secondary | ICD-10-CM | POA: Diagnosis not present

## 2015-02-09 DIAGNOSIS — D649 Anemia, unspecified: Secondary | ICD-10-CM | POA: Diagnosis not present

## 2015-02-26 ENCOUNTER — Ambulatory Visit (INDEPENDENT_AMBULATORY_CARE_PROVIDER_SITE_OTHER): Payer: Medicare Other | Admitting: Cardiovascular Disease

## 2015-02-26 ENCOUNTER — Encounter: Payer: Self-pay | Admitting: Cardiovascular Disease

## 2015-02-26 VITALS — BP 122/60 | HR 62 | Ht 62.0 in | Wt 216.4 lb

## 2015-02-26 DIAGNOSIS — I5022 Chronic systolic (congestive) heart failure: Secondary | ICD-10-CM

## 2015-02-26 MED ORDER — FUROSEMIDE 40 MG PO TABS: ORAL_TABLET | ORAL | Status: DC

## 2015-02-26 MED ORDER — PANTOPRAZOLE SODIUM 40 MG PO TBEC: 40.0000 mg | DELAYED_RELEASE_TABLET | Freq: Two times a day (BID) | ORAL | Status: DC

## 2015-02-26 NOTE — Progress Notes (Signed)
Cardiology Office Note Date:  02/28/2015   ID:  Jorene, Kaylor Aug 21, 1950, MRN [id number]  PCP:  [name], MD  Cardiologist:  Sherren Mocha, MD    Chief Complaint  Patient presents with  . Chest Pain     History of Present Illness: Samantha [name] is a 64 y.o. female who presents for follow-up of CAD and CHF. past medical history significant for CAD, hypertension, hyperlipidemia and diabetes. She denies any history of tobacco abuse. In 2001 she underwent CABG surgery. Since that time, she has required coronary stenting and Cutting Balloon angioplasty. She has a long Taxus drug-eluting stent used to treat in-stent restenosis in the left circumflex. She had been maintained on long-term dual antiplatelet therapy. 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 She has undergone banding. Plavix was discontinued but she was continued on ASA. Her last echocardiogram was done in 2012 and her left ventricular ejection fraction was low normal at 50% with inferolateral and posterior wall hypokinesis. Last seen November 2016 and had chest pain concerning for unstable angina. Underwent cath showing stable coronary anatomy but high filling pressures. Admitted for CHF treatment/diuresis. She is feeling much better on diuretics and is weighing in daily. Still having lots of problems with chest pain/throat pain. This occurs very frequently, almost all day much of the time. Taking lots of Tums. Switch from Prilosec to Protonix hasn't helped at all. Used to be much better controlled on Nexium but can't afford it anymore.   Past Medical History  Diagnosis Date  . Rapid palpitations   . Coronary artery disease     Status post percutaneous coronary intervention and bare metal stenting  of the left circumflex and subsequent percutaneous transluminal coronary anioplasty and placement of a 3.0 NIR bare metal stent proximal to the previous stent.  . Hypertension   .  Hyperlipidemia   . Obesity   . Gastroesophageal reflux disease   . Depression   . Osteoarthritis   . Status post hysterectomy   . History of pneumonia   . H/O: hysterectomy   . CHF (congestive heart failure) ([place])   . Esophageal varices ([place])     New 2013  . Cirrhosis of liver ([place])   . Thrombocytopenia [facility])     Past Surgical History  Procedure Laterality Date  . Coronary artery bypass graft  May 31,2001    x 3 with a vein graft to the first diagonal, vein graft to the right coronary  artery, and a free left internal mammary  artery to the obtuse marginal   . Cardiac catheterization  2004    left internal mammary artery to the obtuse marginal  was found to be small and thread like.  The two grafts were patent.  The left circumflex had 90% in-stent restenosis and cutting balloon angioplasty was performed followed by placement of a 3.0 x 33m Taxus drug -eluting stent.    . Cardiac catheterization  2006    There was in-stent restenosis in the left circumflex and this was treated with cutting balloon angioplasty   . Cardiac catheterization  2008    vein graft to the to the obtuse marginal was patent, although small, left circumflex had 40% in-stent restenosis, ejection fraction 40-45%.  The patient was medically mananged.  . Tonsillectomy    . Joint replacement    . Rotator cuff left  2007  . Back surgery    . Abdominal hysterectomy    . Colonoscopy  03/29/2011  Procedure: COLONOSCOPY;  Surgeon: Jamesetta So, MD;  [facility];  Service: Gastroenterology;  Laterality: N/A;  . Esophagogastroduodenoscopy  12/20/2011    Procedure: ESOPHAGOGASTRODUODENOSCOPY (EGD);  Surgeon: Jamesetta So, MD;  [facility];  Service: Gastroenterology;  Laterality: N/A;  . Esophagogastroduodenoscopy N/A 07/04/2012    Procedure: ESOPHAGOGASTRODUODENOSCOPY (EGD);  Surgeon: Rogene Houston, MD;  [facility];  Service: Endoscopy;  Laterality: N/A;  235-moved to 255 Ann to  notify pt  . Esophageal banding N/A 07/04/2012    Procedure: ESOPHAGEAL BANDING;  Surgeon: Rogene Houston, MD;  [facility];  Service: Endoscopy;  Laterality: N/A;  . Esophagogastroduodenoscopy N/A 09/17/2012    Procedure: ESOPHAGOGASTRODUODENOSCOPY (EGD);  Surgeon: Rogene Houston, MD;  [facility];  Service: Endoscopy;  Laterality: N/A;  730  . Esophageal banding N/A 09/17/2012    Procedure: ESOPHAGEAL BANDING;  Surgeon: Rogene Houston, MD;  [facility];  Service: Endoscopy;  Laterality: N/A;  . Esophagogastroduodenoscopy N/A 10/22/2013    Procedure: ESOPHAGOGASTRODUODENOSCOPY (EGD);  Surgeon: Rogene Houston, MD;  [facility];  Service: Endoscopy;  Laterality: N/A;  730  . Esophageal banding N/A 10/22/2013    Procedure: ESOPHAGEAL BANDING;  Surgeon: Rogene Houston, MD;  [facility];  Service: Endoscopy;  Laterality: N/A;  . Left heart catheterization with coronary/graft angiogram N/A 12/25/2013    Procedure: LEFT HEART CATHETERIZATION WITH Beatrix Fetters;  Surgeon: Blane Ohara, MD;  Location: [facility] CATH LAB;  Service: Cardiovascular;  Laterality: N/A;  . Esophagogastroduodenoscopy N/A 11/28/2014    Procedure: ESOPHAGOGASTRODUODENOSCOPY (EGD);  Surgeon: Rogene Houston, MD;  [facility];  Service: Endoscopy;  Laterality: N/A;  1:25  . Esophageal banding N/A 11/28/2014    Procedure: ESOPHAGEAL BANDING;  Surgeon: Rogene Houston, MD;  [facility];  Service: Endoscopy;  Laterality: N/A;  . Cardiac catheterization N/A 01/09/2015    Procedure: Left Heart Cath and Cors/Grafts Angiography;  Surgeon: Belva Crome, MD;  Location: [place] CV LAB;  Service: Cardiovascular;  Laterality: N/A;    Current Outpatient Prescriptions  Medication Sig Dispense Refill  . albuterol (PROVENTIL HFA;VENTOLIN HFA) 108 (90 BASE) MCG/ACT inhaler Inhale 1-2 puffs into the lungs every 6 (six) hours as needed for wheezing or  shortness of breath.    [name] aspirin (ASPIRIN EC) 81 MG EC tablet Take 81 mg by mouth daily. Swallow whole.    [name] atorvastatin (LIPITOR) 20 MG tablet Take 20 mg by mouth daily at 6 PM.     . carvedilol (COREG) 3.125 MG tablet Take 1 tablet (3.125 mg total) by mouth 2 (two) times daily with a meal. 180 tablet 3  . cetirizine (ZYRTEC) 10 MG tablet Take 10 mg by mouth daily as needed for allergies.     . clonazePAM (KLONOPIN) 0.5 MG tablet Take 0.5 mg by mouth at bedtime.     [name] ezetimibe (ZETIA) 10 MG tablet Take 10 mg by mouth at bedtime.     [name] FLUoxetine (PROZAC) 40 MG capsule Take 40 mg by mouth daily.    . furosemide (LASIX) 40 MG tablet Take two tablets by mouth daily, take 1 extra tablet daily for weight gain of 2 lbs overnight. 180 tablet 3  . HYDROcodone-acetaminophen (NORCO) 10-325 MG tablet Take 1 tablet by mouth every 6 (six) hours as needed (for cough also).    . isosorbide mononitrate (IMDUR) 30 MG CR tablet Take 30 mg by  mouth daily.     [name] NITROSTAT 0.4 MG SL tablet Place 0.4 mg under the tongue every 5 (five) minutes as needed for chest pain (x 3 tabs daily).     . pantoprazole (PROTONIX) 40 MG tablet Take 1 tablet (40 mg total) by mouth 2 (two) times daily. 180 tablet 2  . potassium chloride (K-DUR) 10 MEQ tablet Take 1 tablet (10 mEq total) by mouth daily. 90 tablet 3  . valsartan (DIOVAN) 160 MG tablet Take 80-160 mg by mouth 2 (two) times daily. Take one tab in the morning and 1/2 tab at bed time.     No current facility-administered medications for this visit.    Allergies:   Acetaminophen; Oxycodone; Ace inhibitors; Cefaclor; Cephalexin; Penicillins; Pregabalin; and Tape   Social History:  The patient  reports that she quit smoking about 19 years ago. Her smoking use included Cigarettes. She has a 10 pack-year smoking history. She has never used smokeless tobacco. She reports that she does not drink alcohol or use illicit drugs.   Family History:  The patient's  family history  includes Coronary artery disease in her sister; Heart attack in her mother; Heart attack (age of onset: 41) in her father. There is no history of Colon cancer.    ROS:  Please see the history of present illness.  Otherwise, review of systems is positive for chest pain, DOE, depression, back pain, fatigue, constipation.  All other systems are reviewed and negative.    PHYSICAL EXAM: VS:  BP 122/60 mmHg  Pulse 62  Ht 5' 2"  (1.575 m)  Wt 216 lb 6.4 oz (98.158 kg)  BMI 39.57 kg/m2  SpO2 97%  LMP 03/29/2011 , BMI Body mass index is 39.57 kg/(m^2). GEN: Well nourished, well developed, in no acute distress HEENT: normal Neck: no JVD, no masses. No carotid bruits Cardiac: RRR without murmur or gallop                Respiratory:  clear to auscultation bilaterally, normal work of breathing GI: soft, nontender, nondistended, + BS MS: no deformity or atrophy Ext: no pretibial edema, pedal pulses 2+= bilaterally Skin: warm and dry, no rash Neuro:  Strength and sensation are intact Psych: euthymic mood, full affect  EKG:  EKG is not ordered today.  Recent Labs: 01/08/2015: ALT 22 01/10/2015: B Natriuretic Peptide 110.0* 01/13/2015: Hemoglobin 12.3; Platelets 77* 01/29/2015: BUN 8; Creat 0.81; Potassium 4.3; Sodium 139   Lipid Panel     Component Value Date/Time   CHOL 135 01/09/2015 0455   TRIG 119 01/09/2015 0455   HDL 37* 01/09/2015 0455   CHOLHDL 3.6 01/09/2015 0455   VLDL 24 01/09/2015 0455   LDLCALC 74 01/09/2015 0455      Wt Readings from Last 3 Encounters:  02/26/15 216 lb 6.4 oz (98.158 kg)  01/29/15 217 lb (98.431 kg)  01/13/15 215 lb 1.6 oz (97.569 kg)     Cardiac Studies Reviewed: Cardiac Cath: Conclusion     Bypass graft anatomy and patency is unchanged compared to the most recent study performed in 2015. Patent saphenous vein graft to the right coronary. Patent saphenous vein graft to the diagonal. Atretic free left internal mammary graft to the circumflex  marginal. The internal mammary artery was formed as a man-made-Y graft.  High-grade stenosis in the proximal native right coronary.   Widely patent proximal to mid circumflex stent, unchanged from prior study  50% ostial first diagonal, unchanged from prior study.  Widely patent left main.  Left heart failure with estimated ejection fraction 35-40% with elevated left ventricular end-diastolic pressure (27 mmHg) consistent with acute on chronic physiology .  RECOMMENDATIONS:   Treat heart failure with adjustment in medical regimen.  40 mg of Lasix intravenously at the completion of the procedure.  Will likely benefit from a more aggressive diuretic regimen as now patient     ASSESSMENT AND PLAN: 1.  CAD, native vessel, with chest pain. Suspect primarily GI-related considering recent cath results and near constant nature of pain. She will call Dr Laural Golden for further evaluation. She has varices and has required banding. Will double Protonix to 40 mg BID in the interim. Continue current cardiac medications.  2. Chronic systolic heart failure, NYHA II: stable and significantly improved with daily diuretics. Medications reviewed and will be continued.   3. HTN: controlled  4. Hyperlipidemia: treated with a statin drug and Zetia  Current medicines are reviewed with the patient today.  The patient does not have concerns regarding medicines.  Labs/ tests ordered today include:  No orders of the defined types were placed in this encounter.    Disposition:   FU 6 months  Signed, Sherren Mocha, MD  02/28/2015 10:54 AM    [facility] [place], [place], [place]  [address] Phone: [phone]; Fax: [phone]

## 2015-02-26 NOTE — Patient Instructions (Addendum)
Medication Instructions:  Your physician has recommended you make the following change in your medication:  1. INCREASE Protonix to 31m take one tablet by mouth twice a day  Labwork: No new orders.   Testing/Procedures: No new orders.  Follow-Up: Please contact Dr RLaural Goldenfor follow-up.   Your physician wants you to follow-up in: 6 MONTHS with Dr CBurt Knack  You will receive a reminder letter in the mail two months in advance. If you don't receive a letter, please call our office to schedule the follow-up appointment.   Any Other Special Instructions Will Be Listed Below (If Applicable).     If you need a refill on your cardiac medications before your next appointment, please call your pharmacy.

## 2015-03-04 ENCOUNTER — Ambulatory Visit (INDEPENDENT_AMBULATORY_CARE_PROVIDER_SITE_OTHER): Payer: Medicare Other | Admitting: Internal Medicine

## 2015-03-04 ENCOUNTER — Encounter (INDEPENDENT_AMBULATORY_CARE_PROVIDER_SITE_OTHER): Payer: Self-pay | Admitting: Internal Medicine

## 2015-03-04 VITALS — BP 132/80 | HR 60 | Temp 97.6°F | Ht 62.0 in | Wt 218.6 lb

## 2015-03-04 DIAGNOSIS — K219 Gastro-esophageal reflux disease without esophagitis: Secondary | ICD-10-CM

## 2015-03-04 DIAGNOSIS — I209 Angina pectoris, unspecified: Secondary | ICD-10-CM | POA: Diagnosis not present

## 2015-03-04 NOTE — Patient Instructions (Addendum)
OV in 3 months.  Samples of Dexilant 30mg  x 6 boxes given to patient PR in 2 weeks

## 2015-03-04 NOTE — Progress Notes (Signed)
Subjective:    Patient ID: Samantha [name], female    DOB: Dec 25, 1950, 65 y.o.   MRN: [id number]  HPI Here today for f/u. Re\cently admitted to [facility] for chest pain. She underwent a cardiac cath in November which revealed: Heart failure. She says she had indigestion and SOB. Admitted x 5 days. Her Omeprazole was switched to Protonix 40mg  BID. She tells me she feels like she is choking. She had nausea and vomiting. She said she took 6 Tums which did not help. She took a NTG and the pain eased some but came by.  Presently on a low sodium diet.  She hs lost about 5 pounds since her last visit in August. She underwent an EGD in September (see below).  She says she does have some acid reflux and she says it is not controlled with Protonix.  Hx is significant for CAD and underwent a CABG in 2001 Hx significant for NAFLD/Cirrhosis. Has undergone seveal esophageal bandings in the past    November 2016 Cardiac cath    Bypass graft anatomy and patency is unchanged compared to the most recent study performed in 2015. Patent saphenous vein graft to the right coronary. Patent saphenous vein graft to the diagonal. Atretic free left internal mammary graft to the circumflex marginal. The internal mammary artery was formed as a man-made-Y graft.  High-grade stenosis in the proximal native right coronary.   Widely patent proximal to mid circumflex stent, unchanged from prior study  50% ostial first diagonal, unchanged from prior study.  Widely patent left main.   Left heart failure with estimated ejection fraction 35-40% with elevated left ventricular end-diastolic pressure (27 mmHg) consistent with acute on chronic physiology .          11/28/2014 EGD  Indications: Patient is 65 year old Caucasian female with cirrhosis complicated by portal hypertension and esophageal varices for which she is undergoing banding previously for primary prophylaxis. She is also on propranolol. She is returning for  repeat evaluation. Last EGD with banding was in August 2015. Ultrasound 5 weeks ago revealed irregular liver contour consistent with cirrhosis and splenomegaly. There were no focal liver lesions or ascites.  Impression: Single column of grade II esophageal varix not large enough to be banded. Focal scarring from previous esophageal variceal banding. Portal gastropathy. Engle column of gastric varix and fundus. No significant increase in size since EGD of August 2015       10/22/2013 :Procedure: EGD with esophageal variceal banding.  Indications: Patient is 65 year old Caucasian female with cirrhosis secondary to NAFLD complicated by large esophageal varices which were banded twice last year for primary prophylaxis. She is returning for reevaluation.  Impression: Single large esophageal varix was banded. Focal esophageal scarring from previous banding sessions. Mild portal gastropathy. Single small fundal varix.    Review of Systems Alert and oriented. Skin warm and dry. Oral mucosa is moist.   . Sclera anicteric, conjunctivae is pink. Thyroid not enlarged. No cervical lymphadenopathy. Lungs clear. Heart regular rate and rhythm.  Abdomen is soft. Bowel sounds are positive. No hepatomegaly. No abdominal masses felt. No tenderness.  No edema to lower extremities.       Objective:   Physical Exam Blood pressure 132/80, pulse 60, temperature 97.6 F (36.4 C), height 5\' 2"  (1.575 m), weight 218 lb 9.6 oz (99.156 kg), last menstrual period 03/29/2011. Alert and oriented. Skin warm and dry. Oral mucosa is moist.   . Sclera anicteric, conjunctivae is pink. Thyroid not enlarged. No cervical lymphadenopathy.  Lungs clear. Heart regular rate and rhythm.  Abdomen is soft.  Bowel sounds are positive. No hepatomegaly. No abdominal masses felt. No tenderness.  No edema to lower extremities.          Assessment & Plan:  Chest pain: ? Etiology. Am going to try her on Dexilant (samples). (30mg  twice a day). PR in 2 weeks Hx of NAFLD with esophageal varices. Last EGD in 2016.  OV in 3 months.

## 2015-03-23 ENCOUNTER — Encounter (INDEPENDENT_AMBULATORY_CARE_PROVIDER_SITE_OTHER): Payer: Self-pay | Admitting: *Deleted

## 2015-03-23 ENCOUNTER — Other Ambulatory Visit (INDEPENDENT_AMBULATORY_CARE_PROVIDER_SITE_OTHER): Payer: Self-pay | Admitting: *Deleted

## 2015-03-23 DIAGNOSIS — K760 Fatty (change of) liver, not elsewhere classified: Secondary | ICD-10-CM

## 2015-03-23 DIAGNOSIS — I8500 Esophageal varices without bleeding: Secondary | ICD-10-CM

## 2015-04-16 ENCOUNTER — Encounter (INDEPENDENT_AMBULATORY_CARE_PROVIDER_SITE_OTHER): Payer: Self-pay | Admitting: *Deleted

## 2015-04-23 ENCOUNTER — Other Ambulatory Visit (INDEPENDENT_AMBULATORY_CARE_PROVIDER_SITE_OTHER): Payer: Self-pay | Admitting: Internal Medicine

## 2015-04-23 DIAGNOSIS — K7469 Other cirrhosis of liver: Secondary | ICD-10-CM

## 2015-04-28 DIAGNOSIS — K760 Fatty (change of) liver, not elsewhere classified: Secondary | ICD-10-CM | POA: Diagnosis not present

## 2015-04-28 DIAGNOSIS — I8500 Esophageal varices without bleeding: Secondary | ICD-10-CM | POA: Diagnosis not present

## 2015-04-28 LAB — CBC
HCT: 39.4 % (ref 36.0–46.0)
Hemoglobin: 12.6 g/dL (ref 12.0–15.0)
MCH: 26.4 pg (ref 26.0–34.0)
MCHC: 32 g/dL (ref 30.0–36.0)
MCV: 82.6 fL (ref 78.0–100.0)
MPV: 10.5 fL (ref 8.6–12.4)
Platelets: 80 10*3/uL — ABNORMAL LOW (ref 150–400)
RBC: 4.77 MIL/uL (ref 3.87–5.11)
RDW: 15.6 % — ABNORMAL HIGH (ref 11.5–15.5)
WBC: 3.8 10*3/uL — ABNORMAL LOW (ref 4.0–10.5)

## 2015-04-28 LAB — HEPATIC FUNCTION PANEL
ALT: 18 U/L (ref 6–29)
AST: 51 U/L — ABNORMAL HIGH (ref 10–35)
Albumin: 3.8 g/dL (ref 3.6–5.1)
Alkaline Phosphatase: 94 U/L (ref 33–130)
Bilirubin, Direct: 0.1 mg/dL (ref <=0.2)
Indirect Bilirubin: 0.5 mg/dL (ref 0.2–1.2)
Total Bilirubin: 0.6 mg/dL (ref 0.2–1.2)
Total Protein: 7.1 g/dL (ref 6.1–8.1)

## 2015-04-30 ENCOUNTER — Ambulatory Visit (HOSPITAL_COMMUNITY)
Admission: RE | Admit: 2015-04-30 | Discharge: 2015-04-30 | Disposition: A | Discharge status: Home / Self Care | Payer: Medicare Other | Source: Ambulatory Visit | Attending: Internal Medicine | Admitting: Internal Medicine

## 2015-04-30 DIAGNOSIS — R161 Splenomegaly, not elsewhere classified: Secondary | ICD-10-CM | POA: Insufficient documentation

## 2015-04-30 DIAGNOSIS — K7469 Other cirrhosis of liver: Secondary | ICD-10-CM | POA: Diagnosis not present

## 2015-04-30 DIAGNOSIS — K7460 Unspecified cirrhosis of liver: Secondary | ICD-10-CM | POA: Diagnosis not present

## 2015-05-05 ENCOUNTER — Encounter (INDEPENDENT_AMBULATORY_CARE_PROVIDER_SITE_OTHER): Payer: Self-pay | Admitting: Internal Medicine

## 2015-05-07 ENCOUNTER — Encounter (INDEPENDENT_AMBULATORY_CARE_PROVIDER_SITE_OTHER): Payer: Self-pay | Admitting: *Deleted

## 2015-06-03 ENCOUNTER — Ambulatory Visit (INDEPENDENT_AMBULATORY_CARE_PROVIDER_SITE_OTHER): Payer: Medicare Other | Admitting: Internal Medicine

## 2015-06-04 DIAGNOSIS — E119 Type 2 diabetes mellitus without complications: Secondary | ICD-10-CM | POA: Diagnosis not present

## 2015-06-04 DIAGNOSIS — I10 Essential (primary) hypertension: Secondary | ICD-10-CM | POA: Diagnosis not present

## 2015-06-04 DIAGNOSIS — E7800 Pure hypercholesterolemia, unspecified: Secondary | ICD-10-CM | POA: Diagnosis not present

## 2015-06-04 DIAGNOSIS — K210 Gastro-esophageal reflux disease with esophagitis: Secondary | ICD-10-CM | POA: Diagnosis not present

## 2015-06-04 DIAGNOSIS — D649 Anemia, unspecified: Secondary | ICD-10-CM | POA: Diagnosis not present

## 2015-06-10 ENCOUNTER — Telehealth: Payer: Self-pay | Admitting: Cardiovascular Disease

## 2015-06-10 DIAGNOSIS — I10 Essential (primary) hypertension: Secondary | ICD-10-CM | POA: Diagnosis not present

## 2015-06-10 DIAGNOSIS — D649 Anemia, unspecified: Secondary | ICD-10-CM | POA: Diagnosis not present

## 2015-06-10 DIAGNOSIS — D696 Thrombocytopenia, unspecified: Secondary | ICD-10-CM | POA: Diagnosis not present

## 2015-06-10 DIAGNOSIS — G4709 Other insomnia: Secondary | ICD-10-CM | POA: Diagnosis not present

## 2015-06-10 DIAGNOSIS — D72819 Decreased white blood cell count, unspecified: Secondary | ICD-10-CM | POA: Diagnosis not present

## 2015-06-10 DIAGNOSIS — I5023 Acute on chronic systolic (congestive) heart failure: Secondary | ICD-10-CM | POA: Diagnosis not present

## 2015-06-10 DIAGNOSIS — F3342 Major depressive disorder, recurrent, in full remission: Secondary | ICD-10-CM | POA: Diagnosis not present

## 2015-06-10 DIAGNOSIS — E119 Type 2 diabetes mellitus without complications: Secondary | ICD-10-CM | POA: Diagnosis not present

## 2015-06-10 NOTE — Telephone Encounter (Signed)
New Message:  Pt called in wanting to make Dr. Burt Knack aware that Dr. Quintin Alto (her PCP) increased her Isosorbide from 75m to 626m If Dr. CoBurt Knackoes not approve of this change please f/u with pt

## 2015-06-10 NOTE — Telephone Encounter (Signed)
I spoke with the pt and Dr Quintin Alto instructed the pt to increase her Isosorbide MN to 60mg  daily due to chest discomfort.  She said she takes 140 TUMS per week and they are not helping her symptoms. The pt does take 1-2 NTG when having chest discomfort and this helps her symptoms ease off.  The pt is due to follow-up again with Dr Laural Golden for endoscopy.  This has been an ongoing issue for the pt and it is not unusual for the pt to use NTG frequently.  I instructed the pt to try the higher dosage of Isosorbide MN and contact the office in 1 week if her symptoms have not improved.  Pt agreed with plan.

## 2015-06-11 NOTE — Telephone Encounter (Signed)
Agree with plan. Recent cath reviewed with stable anatomy - patent grafts and stent sites

## 2015-08-31 ENCOUNTER — Ambulatory Visit: Payer: Medicare Other | Admitting: Cardiovascular Disease

## 2015-09-08 ENCOUNTER — Ambulatory Visit (INDEPENDENT_AMBULATORY_CARE_PROVIDER_SITE_OTHER): Payer: Medicare Other | Admitting: Internal Medicine

## 2015-09-08 ENCOUNTER — Other Ambulatory Visit (INDEPENDENT_AMBULATORY_CARE_PROVIDER_SITE_OTHER): Payer: Self-pay | Admitting: Internal Medicine

## 2015-09-08 ENCOUNTER — Encounter (INDEPENDENT_AMBULATORY_CARE_PROVIDER_SITE_OTHER): Payer: Self-pay | Admitting: Internal Medicine

## 2015-09-08 ENCOUNTER — Encounter (INDEPENDENT_AMBULATORY_CARE_PROVIDER_SITE_OTHER): Payer: Self-pay | Admitting: *Deleted

## 2015-09-08 VITALS — BP 142/70 | HR 64 | Temp 98.2°F | Ht 62.0 in | Wt 221.6 lb

## 2015-09-08 DIAGNOSIS — K7460 Unspecified cirrhosis of liver: Secondary | ICD-10-CM

## 2015-09-08 DIAGNOSIS — K625 Hemorrhage of anus and rectum: Secondary | ICD-10-CM | POA: Diagnosis not present

## 2015-09-08 LAB — CBC WITH DIFFERENTIAL/PLATELET
Basophils Absolute: 36 cells/uL (ref 0–200)
Basophils Relative: 1 %
Eosinophils Absolute: 180 cells/uL (ref 15–500)
Eosinophils Relative: 5 %
HCT: 38.6 % (ref 35.0–45.0)
Hemoglobin: 12 g/dL (ref 11.7–15.5)
Lymphocytes Relative: 25 %
Lymphs Abs: 900 cells/uL (ref 850–3900)
MCH: 25 pg — ABNORMAL LOW (ref 27.0–33.0)
MCHC: 31.1 g/dL — ABNORMAL LOW (ref 32.0–36.0)
MCV: 80.4 fL (ref 80.0–100.0)
MPV: 10.4 fL (ref 7.5–12.5)
Monocytes Absolute: 324 cells/uL (ref 200–950)
Monocytes Relative: 9 %
Neutro Abs: 2160 cells/uL (ref 1500–7800)
Neutrophils Relative %: 60 %
Platelets: 85 10*3/uL — ABNORMAL LOW (ref 140–400)
RBC: 4.8 MIL/uL (ref 3.80–5.10)
RDW: 16.2 % — ABNORMAL HIGH (ref 11.0–15.0)
WBC: 3.6 10*3/uL — ABNORMAL LOW (ref 3.8–10.8)

## 2015-09-08 NOTE — Patient Instructions (Signed)
Labs. The risks and benefits such as perforation, bleeding, and infection were reviewed with the patient and is agreeable.

## 2015-09-08 NOTE — Progress Notes (Signed)
Subjective:    Patient ID: Samantha [name], female    DOB: Oct 30, 1950, 65 y.o.   MRN: [id number]  HPI  : F/u cirrhosis. She was last seen in January of this year.  [place] the [place]. She is doing well. She feels like she is choking with her pills. She says foods are not lodging. Her appetite is good. She has gained 3 pounds since  Today she states when she has a BM she is seeing blood. She says her stools are dark brown. She has to strain to have a BM. She is not taking a stool softener. She has not seen any black stools. Usually has 1-2 stools a day.  Her last EGD was in 2016. She had a single column of grade 11 esophageal varix not large enough to be banded. She says she has acid reflux when she has to take her pills.  Significant cardiac hx and maintained on ASA.    03/29/2011 Colonoscopy Dr. Arnoldo Morale: Diverticula in sigmoid colon. Otherwise normal.  04/30/2015 US abdomen IMPRESSION: 1. Cirrhotic morphology of the liver. No focal liver lesions identified. 2. Stable appearance of splenomegaly, likely related to portal venous hypertension. 3. Nonspecific gallbladder wall thickening without other evidence for acute cholecystitis.  11/28/2014 EGD  Indications: Patient is 65 year old Caucasian female with cirrhosis complicated by portal hypertension and esophageal varices for which she is undergoing banding previously for primary prophylaxis. She is also on propranolol. She is returning for repeat evaluation. Last EGD with banding was in August 2015. Ultrasound 5 weeks ago revealed irregular liver contour consistent with cirrhosis and splenomegaly. There were no focal liver lesions or ascites.  Impression: Single column of grade II esophageal varix not large enough to be banded. Focal scarring from previous esophageal variceal banding. Portal gastropathy. Engle  column of gastric varix and fundus. No significant increase in size since EGD of August 2015     10/22/2013 :Procedure: EGD with esophageal variceal banding.  Indications: Patient is 65 year old Caucasian female with cirrhosis secondary to NAFLD complicated by large esophageal varices which were banded twice last year for primary prophylaxis. She is returning for reevaluation.  Impression: Single large esophageal varix was banded. Focal esophageal scarring from previous banding sessions. Mild portal gastropathy. Single small fundal varix.  Review of Systems Past Medical History  Diagnosis Date  . Rapid palpitations   . Coronary artery disease     Status post percutaneous coronary intervention and bare metal stenting  of the left circumflex and subsequent percutaneous transluminal coronary anioplasty and placement of a 3.0 NIR bare metal stent proximal to the previous stent.  . Hypertension   . Hyperlipidemia   . Obesity   . Gastroesophageal reflux disease   . Depression   . Osteoarthritis   . Status post hysterectomy   . History of pneumonia   . H/O: hysterectomy   . CHF (congestive heart failure) ([place])   . Esophageal varices ([place])     New 2013  . Cirrhosis of liver ([place])   . Thrombocytopenia [facility])     Past Surgical History  Procedure Laterality Date  . Coronary artery bypass graft  May 31,2001    x 3 with a vein graft to the first diagonal, vein graft to the right coronary  artery, and a free left internal mammary  artery to the obtuse marginal   . Cardiac catheterization  2004    left internal mammary artery to the obtuse marginal  was found to be  small and thread like.  The two grafts were patent.  The left circumflex had 90% in-stent restenosis and cutting balloon angioplasty was performed followed by placement of a 3.0 x 98m Taxus drug -eluting  stent.    . Cardiac catheterization  2006    There was in-stent restenosis in the left circumflex and this was treated with cutting balloon angioplasty   . Cardiac catheterization  2008    vein graft to the to the obtuse marginal was patent, although small, left circumflex had 40% in-stent restenosis, ejection fraction 40-45%.  The patient was medically mananged.  . Tonsillectomy    . Joint replacement    . Rotator cuff left  2007  . Back surgery    . Abdominal hysterectomy    . Colonoscopy  03/29/2011    Procedure: COLONOSCOPY;  Surgeon: MJamesetta So MD;  [facility];  Service: Gastroenterology;  Laterality: N/A;  . Esophagogastroduodenoscopy  12/20/2011    Procedure: ESOPHAGOGASTRODUODENOSCOPY (EGD);  Surgeon: MJamesetta So MD;  [facility];  Service: Gastroenterology;  Laterality: N/A;  . Esophagogastroduodenoscopy N/A 07/04/2012    Procedure: ESOPHAGOGASTRODUODENOSCOPY (EGD);  Surgeon: NRogene Houston MD;  [facility];  Service: Endoscopy;  Laterality: N/A;  235-moved to 255 Ann to notify pt  . Esophageal banding N/A 07/04/2012    Procedure: ESOPHAGEAL BANDING;  Surgeon: NRogene Houston MD;  [facility];  Service: Endoscopy;  Laterality: N/A;  . Esophagogastroduodenoscopy N/A 09/17/2012    Procedure: ESOPHAGOGASTRODUODENOSCOPY (EGD);  Surgeon: NRogene Houston MD;  [facility];  Service: Endoscopy;  Laterality: N/A;  730  . Esophageal banding N/A 09/17/2012    Procedure: ESOPHAGEAL BANDING;  Surgeon: NRogene Houston MD;  [facility];  Service: Endoscopy;  Laterality: N/A;  . Esophagogastroduodenoscopy N/A 10/22/2013    Procedure: ESOPHAGOGASTRODUODENOSCOPY (EGD);  Surgeon: NRogene Houston MD;  [facility];  Service: Endoscopy;  Laterality: N/A;  730  . Esophageal banding N/A 10/22/2013    Procedure: ESOPHAGEAL BANDING;  Surgeon: NRogene Houston MD;  [facility];  Service: Endoscopy;   Laterality: N/A;  . Left heart catheterization with coronary/graft angiogram N/A 12/25/2013    Procedure: LEFT HEART CATHETERIZATION WITH CBeatrix Fetters  Surgeon: MBlane Ohara MD;  [facility];  Service: Cardiovascular;  Laterality: N/A;  . Esophagogastroduodenoscopy N/A 11/28/2014    Procedure: ESOPHAGOGASTRODUODENOSCOPY (EGD);  Surgeon: NRogene Houston MD;  [facility];  Service: Endoscopy;  Laterality: N/A;  1:25  . Esophageal banding N/A 11/28/2014    Procedure: ESOPHAGEAL BANDING;  Surgeon: NRogene Houston MD;  [facility];  Service: Endoscopy;  Laterality: N/A;  . Cardiac catheterization N/A 01/09/2015    Procedure: Left Heart Cath and Cors/Grafts Angiography;  Surgeon: HBelva Crome MD;  [facility];  Service: Cardiovascular;  Laterality: N/A;    Allergies  Allergen Reactions  . Acetaminophen Other (See Comments)    Liver problems  . Oxycodone Itching and Nausea Only  . Ace Inhibitors Other (See Comments)  . Cefaclor Itching and Rash  . Cephalexin Itching and Rash  . Penicillins Rash  . Pregabalin Other (See Comments)    Retains fluid  . Tape Itching and Rash    Takes skin right off with medical tape    Current Outpatient Prescriptions on File Prior to Visit  Medication Sig Dispense Refill  . albuterol (PROVENTIL HFA;VENTOLIN HFA) 108 (90 BASE)  MCG/ACT inhaler Inhale 1-2 puffs into the lungs every 6 (six) hours as needed for wheezing or shortness of breath.    [name] aspirin (ASPIRIN EC) 81 MG EC tablet Take 81 mg by mouth daily. Swallow whole.    [name] atorvastatin (LIPITOR) 20 MG tablet Take 20 mg by mouth daily at 6 PM.     . carvedilol (COREG) 3.125 MG tablet Take 1 tablet (3.125 mg total) by mouth 2 (two) times daily with a meal. 180 tablet 3  . cetirizine (ZYRTEC) 10 MG tablet Take 10 mg by mouth daily as needed for allergies.     . clonazePAM (KLONOPIN) 0.5 MG tablet Take 0.5 mg by mouth at bedtime.     [name]  ezetimibe (ZETIA) 10 MG tablet Take 10 mg by mouth at bedtime.     [name] FLUoxetine (PROZAC) 40 MG capsule Take 40 mg by mouth daily.    . furosemide (LASIX) 40 MG tablet Take two tablets by mouth daily, take 1 extra tablet daily for weight gain of 2 lbs overnight. 180 tablet 3  . HYDROcodone-acetaminophen (NORCO) 10-325 MG tablet Take 1 tablet by mouth every 6 (six) hours as needed (for cough also).    . isosorbide mononitrate (IMDUR) 30 MG CR tablet Take 30 mg by mouth daily.     [name] NITROSTAT 0.4 MG SL tablet Place 0.4 mg under the tongue every 5 (five) minutes as needed for chest pain (x 3 tabs daily).     . pantoprazole (PROTONIX) 40 MG tablet Take 1 tablet (40 mg total) by mouth 2 (two) times daily. 180 tablet 2  . potassium chloride (K-DUR) 10 MEQ tablet Take 1 tablet (10 mEq total) by mouth daily. 90 tablet 3  . valsartan (DIOVAN) 160 MG tablet Take 80-160 mg by mouth 2 (two) times daily. Take one tab in the morning and 1/2 tab at bed time.     No current facility-administered medications on file prior to visit.        Objective:   Physical ExamBlood pressure 142/70, pulse 64, temperature 98.2 F (36.8 C), height 5' 2"  (1.575 m), weight 221 lb 9.6 oz (100.517 kg), last menstrual period 03/29/2011. Alert and oriented. Skin warm and dry. Oral mucosa is moist.   . Sclera anicteric, conjunctivae is pink. Thyroid not enlarged. No cervical lymphadenopathy. Lungs clear. Heart regular rate and rhythm.  Abdomen is soft. Bowel sounds are positive. No hepatomegaly. No abdominal masses felt. No tenderness.  No edema to lower extremities.  Stool light brown and guaiac negative.         Assessment & Plan:  Cirrhosis: Patient must diet and exercise. She has gained about 3 pounds since her last visit. CBC and hepatic function today.  Hx of esophageal varices: I think Dr Laural Golden needs to take a look to see if any need banding. Last banding in September 2016. She gives a recent hx of rectal bleeding.  EGD/possible banding.

## 2015-09-09 LAB — HEPATIC FUNCTION PANEL
ALT: 16 U/L (ref 6–29)
AST: 44 U/L — ABNORMAL HIGH (ref 10–35)
Albumin: 3.8 g/dL (ref 3.6–5.1)
Alkaline Phosphatase: 83 U/L (ref 33–130)
Bilirubin, Direct: 0.2 mg/dL (ref <=0.2)
Indirect Bilirubin: 0.6 mg/dL (ref 0.2–1.2)
Total Bilirubin: 0.8 mg/dL (ref 0.2–1.2)
Total Protein: 6.9 g/dL (ref 6.1–8.1)

## 2015-09-21 ENCOUNTER — Ambulatory Visit (INDEPENDENT_AMBULATORY_CARE_PROVIDER_SITE_OTHER): Payer: Medicare Other

## 2015-09-21 ENCOUNTER — Encounter: Payer: Self-pay | Admitting: Cardiovascular Disease

## 2015-09-21 ENCOUNTER — Ambulatory Visit (INDEPENDENT_AMBULATORY_CARE_PROVIDER_SITE_OTHER): Payer: Medicare Other | Admitting: Cardiovascular Disease

## 2015-09-21 VITALS — BP 138/82 | HR 95 | Ht 62.0 in | Wt 220.8 lb

## 2015-09-21 DIAGNOSIS — I471 Supraventricular tachycardia: Secondary | ICD-10-CM

## 2015-09-21 DIAGNOSIS — I25119 Atherosclerotic heart disease of native coronary artery with unspecified angina pectoris: Secondary | ICD-10-CM | POA: Diagnosis not present

## 2015-09-21 MED ORDER — ISOSORBIDE MONONITRATE ER 60 MG PO TB24: 60.0000 mg | ORAL_TABLET | Freq: Every day | ORAL | 11 refills | Status: DC

## 2015-09-21 NOTE — Patient Instructions (Signed)
Medication Instructions:  Your physician has recommended you make the following change in your medication:  INCREASE Imdur to 30m daily. An Rx has been sent to your pharmacy   Labwork: None ordered  Testing/Procedures: Your physician has recommended that you wear a holter monitor. Holter monitors are medical devices that record the heart's electrical activity. Doctors most often use these monitors to diagnose arrhythmias. Arrhythmias are problems with the speed or rhythm of the heartbeat. The monitor is a small, portable device. You can wear one while you do your normal daily activities. This is usually used to diagnose what is causing palpitations/syncope (passing out).    Follow-Up: Your physician wants you to follow-up in: 6 months with Dr.Cooper  You will receive a reminder letter in the mail two months in advance. If you don't receive a letter, please call our office to schedule the follow-up appointment.   Any Other Special Instructions Will Be Listed Below (If Applicable).     If you need a refill on your cardiac medications before your next appointment, please call your pharmacy.

## 2015-09-21 NOTE — Progress Notes (Signed)
Cardiology Office Note Date:  09/22/2015   ID:  Samantha, [name] 07/14/1950, MRN [id number]  PCP:  [name], MD  Cardiologist:  Sherren Mocha, MD    No chief complaint on file.    History of Present Illness: Samantha [name] is a 65 y.o. female who presents for follow-up evaluation. She was last seen 02-28-2015 as an outpatient. The patient has a hx of CAD s/p CABG in 2703, chronic systolic heart failure, and multiple PCI procedures. 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 She was hospitalized in 2016 with congestive heart failure requiring IV diuresis.   She continues to complain of severe indigestion on a daily basis, worse when supine at bedtime. She doesn't get much relief with Tums but has been taking NTG with some relief. This has been a longstanding problem. Describes a 'choking sensation' in her chest, throat, and shoulders. Pain has a 'burning' characteristic. Also complains of generalized fatigue.   Past Medical History:  Diagnosis Date  . CHF (congestive heart failure) ([place])   . Cirrhosis of liver ([place])   . Coronary artery disease    Status post percutaneous coronary intervention and bare metal stenting  of the left circumflex and subsequent percutaneous transluminal coronary anioplasty and placement of a 3.0 NIR bare metal stent proximal to the previous stent.  . Depression   . Esophageal varices ([place])    New 2013  . Gastroesophageal reflux disease   . H/O: hysterectomy   . History of pneumonia   . Hyperlipidemia   . Hypertension   . Obesity   . Osteoarthritis   . Rapid palpitations   . Status post hysterectomy   . Thrombocytopenia ([place])     Past Surgical History:  Procedure Laterality Date  . ABDOMINAL HYSTERECTOMY    . BACK SURGERY    . CARDIAC CATHETERIZATION  2004   left internal mammary artery to the obtuse marginal  was found to be small and thread like.  The two grafts were patent.  The left circumflex had  90% in-stent restenosis and cutting balloon angioplasty was performed followed by placement of a 3.0 x 79m Taxus drug -eluting stent.    .Marland KitchenCARDIAC CATHETERIZATION  2006   There was in-stent restenosis in the left circumflex and this was treated with cutting balloon angioplasty   . CARDIAC CATHETERIZATION  2008   vein graft to the to the obtuse marginal was patent, although small, left circumflex had 40% in-stent restenosis, ejection fraction 40-45%.  The patient was medically mananged.  .Marland KitchenCARDIAC CATHETERIZATION N/A 01/09/2015   Procedure: Left Heart Cath and Cors/Grafts Angiography;  Surgeon: HBelva Crome MD;  [facility];  Service: Cardiovascular;  Laterality: N/A;  . COLONOSCOPY  03/29/2011   Procedure: COLONOSCOPY;  Surgeon: MJamesetta So MD;  [facility];  Service: Gastroenterology;  Laterality: N/A;  . CORONARY ARTERY BYPASS GRAFT  May 31,2001   x 3 with a vein graft to the first diagonal, vein graft to the right coronary  artery, and a free left internal mammary  artery to the obtuse marginal   . ESOPHAGEAL BANDING N/A 07/04/2012   Procedure: ESOPHAGEAL BANDING;  Surgeon: NRogene Houston MD;  [facility];  Service: Endoscopy;  Laterality: N/A;  . ESOPHAGEAL BANDING N/A 09/17/2012   Procedure: ESOPHAGEAL BANDING;  Surgeon: NRogene Houston MD;  [facility];  Service: Endoscopy;  Laterality: N/A;  . ESOPHAGEAL BANDING N/A 10/22/2013  Procedure: ESOPHAGEAL BANDING;  Surgeon: Rogene Houston, MD;  [facility];  Service: Endoscopy;  Laterality: N/A;  . ESOPHAGEAL BANDING N/A 11/28/2014   Procedure: ESOPHAGEAL BANDING;  Surgeon: Rogene Houston, MD;  [facility];  Service: Endoscopy;  Laterality: N/A;  . ESOPHAGOGASTRODUODENOSCOPY  12/20/2011   Procedure: ESOPHAGOGASTRODUODENOSCOPY (EGD);  Surgeon: Jamesetta So, MD;  [facility];  Service: Gastroenterology;  Laterality: N/A;  . ESOPHAGOGASTRODUODENOSCOPY  N/A 07/04/2012   Procedure: ESOPHAGOGASTRODUODENOSCOPY (EGD);  Surgeon: Rogene Houston, MD;  [facility];  Service: Endoscopy;  Laterality: N/A;  235-moved to 255 Ann to notify pt  . ESOPHAGOGASTRODUODENOSCOPY N/A 09/17/2012   Procedure: ESOPHAGOGASTRODUODENOSCOPY (EGD);  Surgeon: Rogene Houston, MD;  [facility];  Service: Endoscopy;  Laterality: N/A;  730  . ESOPHAGOGASTRODUODENOSCOPY N/A 10/22/2013   Procedure: ESOPHAGOGASTRODUODENOSCOPY (EGD);  Surgeon: Rogene Houston, MD;  [facility];  Service: Endoscopy;  Laterality: N/A;  730  . ESOPHAGOGASTRODUODENOSCOPY N/A 11/28/2014   Procedure: ESOPHAGOGASTRODUODENOSCOPY (EGD);  Surgeon: Rogene Houston, MD;  [facility];  Service: Endoscopy;  Laterality: N/A;  1:25  . JOINT REPLACEMENT    . LEFT HEART CATHETERIZATION WITH CORONARY/GRAFT ANGIOGRAM N/A 12/25/2013   Procedure: LEFT HEART CATHETERIZATION WITH Beatrix Fetters;  Surgeon: Blane Ohara, MD;  Location: [facility] CATH [facility];  Service: Cardiovascular;  Laterality: N/A;  . rotator cuff left  2007  . TONSILLECTOMY      Current Outpatient Prescriptions  Medication Sig Dispense Refill  . albuterol (PROVENTIL HFA;VENTOLIN HFA) 108 (90 BASE) MCG/ACT inhaler Inhale 1-2 puffs into the lungs every 6 (six) hours as needed for wheezing or shortness of breath.    [name] aspirin (ASPIRIN EC) 81 MG EC tablet Take 81 mg by mouth daily. Swallow whole.    [name] atorvastatin (LIPITOR) 20 MG tablet Take 20 mg by mouth daily at 6 PM.     . carvedilol (COREG) 3.125 MG tablet Take 1 tablet (3.125 mg total) by mouth 2 (two) times daily with a meal. 180 tablet 3  . cetirizine (ZYRTEC) 10 MG tablet Take 10 mg by mouth daily as needed for allergies.     . clonazePAM (KLONOPIN) 0.5 MG tablet Take 0.5 mg by mouth at bedtime.     [name] ezetimibe (ZETIA) 10 MG tablet Take 10 mg by mouth at bedtime.     [name] FLUoxetine (PROZAC) 40 MG capsule Take 40 mg by mouth daily.    . furosemide  (LASIX) 40 MG tablet Take two tablets by mouth daily, take 1 extra tablet daily for weight gain of 2 lbs overnight. 180 tablet 3  . HYDROcodone-acetaminophen (NORCO) 10-325 MG tablet Take 1 tablet by mouth every 6 (six) hours as needed (for cough also).    [name] NITROSTAT 0.4 MG SL tablet Place 0.4 mg under the tongue every 5 (five) minutes as needed for chest pain (x 3 tabs daily).     . pantoprazole (PROTONIX) 40 MG tablet Take 1 tablet (40 mg total) by mouth 2 (two) times daily. 180 tablet 2  . potassium chloride (K-DUR) 10 MEQ tablet Take 1 tablet (10 mEq total) by mouth daily. 90 tablet 3  . valsartan (DIOVAN) 160 MG tablet Take 80-160 mg by mouth 2 (two) times daily. Take one tab in the morning and 1/2 tab at bed time.    . isosorbide mononitrate (IMDUR) 60 MG 24 hr tablet Take 1 tablet (60 mg total) by mouth daily. 30 tablet  11   No current facility-administered medications for this visit.     Allergies:   Acetaminophen; Oxycodone; Ace inhibitors; Cefaclor; Cephalexin; Penicillins; Pregabalin; and Tape   Social History:  The patient  reports that she quit smoking about 20 years ago. Her smoking use included Cigarettes. She has a 10.00 pack-year smoking history. She has never used smokeless tobacco. She reports that she does not drink alcohol or use drugs.   Family History:  The patient's family history includes Coronary artery disease in her sister; Heart attack in her mother; Heart attack (age of onset: 74) in her father.    ROS:  Please see the history of present illness.  Otherwise, review of systems is positive for leg swelling, cough, depression, blood in urine, back pain, easy bruising, fatigue, constipation, anxiety, balance problems.  All other systems are reviewed and negative.    PHYSICAL EXAM: VS:  BP 138/82   Pulse 95   Ht 5' 2"  (1.575 m)   Wt 100.2 kg (220 lb 12.8 oz)   LMP 03/29/2011   BMI 40.38 kg/m  , BMI Body mass index is 40.38 kg/m. GEN: Well nourished, well  developed, pleasant obese woman in no acute distress  HEENT: normal  Neck: no JVD, no masses. No carotid bruits Cardiac: RRR without murmur or gallop                Respiratory:  clear to auscultation bilaterally, normal work of breathing GI: soft, nontender, nondistended, + BS MS: no deformity or atrophy  Ext: 1+ bilateral pretibial edema, pedal pulses 2+= bilaterally Skin: warm and dry, no rash Neuro:  Strength and sensation are intact Psych: euthymic mood, full affect  EKG:  EKG is ordered today. The ekg ordered today shows normal sinus rhythm with a supraventricular run of 7 beats, nonspecific IVCD, nonspecific T wave abnormality, single PVC  Recent Labs: 01/10/2015: B Natriuretic Peptide 110.0 01/29/2015: BUN 8; Creat 0.81; Potassium 4.3; Sodium 139 09/08/2015: ALT 16; Hemoglobin 12.0; Platelets 85   Lipid Panel     Component Value Date/Time   CHOL 135 01/09/2015 0455   TRIG 119 01/09/2015 0455   HDL 37 (L) 01/09/2015 0455   CHOLHDL 3.6 01/09/2015 0455   VLDL 24 01/09/2015 0455   LDLCALC 74 01/09/2015 0455      Wt Readings from Last 3 Encounters:  09/21/15 100.2 kg (220 lb 12.8 oz)  09/08/15 100.5 kg (221 lb 9.6 oz)  03/04/15 99.2 kg (218 lb 9.6 oz)     Cardiac Studies Reviewed: Cardiac Cath 01-09-2015: Conclusion    Bypass graft anatomy and patency is unchanged compared to the most recent study performed in 2015. Patent saphenous vein graft to the right coronary. Patent saphenous vein graft to the diagonal. Atretic free left internal mammary graft to the circumflex marginal. The internal mammary artery was formed as a man-made-Y graft.  High-grade stenosis in the proximal native right coronary.   Widely patent proximal to mid circumflex stent, unchanged from prior study  50% ostial first diagonal, unchanged from prior study.  Widely patent left main.   Left heart failure with estimated ejection fraction 35-40% with elevated left ventricular end-diastolic  pressure (27 mmHg) consistent with acute on chronic physiology .  RECOMMENDATIONS:   Treat heart failure with adjustment in medical regimen.  40 mg of Lasix intravenously at the completion of the procedure.  Will likely benefit from a more aggressive diuretic regimen as now patient       ASSESSMENT AND PLAN: 1.  Chronic systolic heart failure: NYHA 2: Patient is stable on her current medical program. He will continue the same.  2. CAD, native vessel: with CCS 3 angina: Very difficult to sort out her symptoms. Could be GI related/GERD versus ischemia mediated discomfort. She has undergone recent cardiac catheterization and found to have stable anatomy without high-grade stenosis. However, her pain is nitroglycerin responsive. Recommend increase isosorbide 60 mg daily. Otherwise continue current therapy.  3. HTN: Blood pressure well controlled.  4. Hyperlipidemia: Treated with atorvastatin and ezetimibe.  5. Cirrhosis/esophageal varices: followed closely by Dr Laural Golden. Upcoming EGD.   6. PSVT: seen on EKG today. Check 48 hour Holter to evaluate arrhythmia as possible cause of symptoms.  Current medicines are reviewed with the patient today.  The patient does not have concerns regarding medicines.  Labs/ tests ordered today include:   Orders Placed This Encounter  Procedures  . Holter monitor - 48 hour  . EKG 12-Lead    Disposition:   FU 6 months  Signed, Sherren Mocha, MD  09/22/2015 5:44 AM    [facility] [address], [place], [place]  [address] Phone: [phone]; Fax: [phone]

## 2015-09-24 DIAGNOSIS — I471 Supraventricular tachycardia: Secondary | ICD-10-CM | POA: Diagnosis not present

## 2015-10-27 DIAGNOSIS — E7800 Pure hypercholesterolemia, unspecified: Secondary | ICD-10-CM | POA: Diagnosis not present

## 2015-10-27 DIAGNOSIS — E782 Mixed hyperlipidemia: Secondary | ICD-10-CM | POA: Diagnosis not present

## 2015-10-27 DIAGNOSIS — K7460 Unspecified cirrhosis of liver: Secondary | ICD-10-CM | POA: Diagnosis not present

## 2015-10-27 DIAGNOSIS — E1140 Type 2 diabetes mellitus with diabetic neuropathy, unspecified: Secondary | ICD-10-CM | POA: Diagnosis not present

## 2015-10-27 DIAGNOSIS — I10 Essential (primary) hypertension: Secondary | ICD-10-CM | POA: Diagnosis not present

## 2015-10-27 DIAGNOSIS — D649 Anemia, unspecified: Secondary | ICD-10-CM | POA: Diagnosis not present

## 2015-10-27 DIAGNOSIS — K210 Gastro-esophageal reflux disease with esophagitis: Secondary | ICD-10-CM | POA: Diagnosis not present

## 2015-10-29 ENCOUNTER — Encounter (HOSPITAL_COMMUNITY): Payer: Self-pay

## 2015-10-29 ENCOUNTER — Encounter (HOSPITAL_COMMUNITY)
Admission: RE | Disposition: A | Discharge status: Home / Self Care | Payer: Self-pay | Source: Ambulatory Visit | Attending: Internal Medicine

## 2015-10-29 ENCOUNTER — Ambulatory Visit (HOSPITAL_COMMUNITY)
Admission: RE | Admit: 2015-10-29 | Discharge: 2015-10-29 | Disposition: A | Discharge status: Home / Self Care | Payer: Medicare Other | Source: Ambulatory Visit | Attending: Internal Medicine | Admitting: Internal Medicine

## 2015-10-29 DIAGNOSIS — I8500 Esophageal varices without bleeding: Secondary | ICD-10-CM | POA: Diagnosis not present

## 2015-10-29 DIAGNOSIS — Z7982 Long term (current) use of aspirin: Secondary | ICD-10-CM | POA: Insufficient documentation

## 2015-10-29 DIAGNOSIS — K766 Portal hypertension: Secondary | ICD-10-CM | POA: Diagnosis not present

## 2015-10-29 DIAGNOSIS — I110 Hypertensive heart disease with heart failure: Secondary | ICD-10-CM | POA: Insufficient documentation

## 2015-10-29 DIAGNOSIS — K228 Other specified diseases of esophagus: Secondary | ICD-10-CM | POA: Diagnosis not present

## 2015-10-29 DIAGNOSIS — K3189 Other diseases of stomach and duodenum: Secondary | ICD-10-CM | POA: Insufficient documentation

## 2015-10-29 DIAGNOSIS — Z8719 Personal history of other diseases of the digestive system: Secondary | ICD-10-CM | POA: Insufficient documentation

## 2015-10-29 DIAGNOSIS — R1314 Dysphagia, pharyngoesophageal phase: Secondary | ICD-10-CM

## 2015-10-29 DIAGNOSIS — R1310 Dysphagia, unspecified: Secondary | ICD-10-CM | POA: Diagnosis not present

## 2015-10-29 DIAGNOSIS — M1990 Unspecified osteoarthritis, unspecified site: Secondary | ICD-10-CM | POA: Insufficient documentation

## 2015-10-29 DIAGNOSIS — I509 Heart failure, unspecified: Secondary | ICD-10-CM | POA: Insufficient documentation

## 2015-10-29 DIAGNOSIS — I2510 Atherosclerotic heart disease of native coronary artery without angina pectoris: Secondary | ICD-10-CM | POA: Insufficient documentation

## 2015-10-29 DIAGNOSIS — Z87891 Personal history of nicotine dependence: Secondary | ICD-10-CM | POA: Diagnosis not present

## 2015-10-29 DIAGNOSIS — Z79899 Other long term (current) drug therapy: Secondary | ICD-10-CM | POA: Insufficient documentation

## 2015-10-29 DIAGNOSIS — K7460 Unspecified cirrhosis of liver: Secondary | ICD-10-CM

## 2015-10-29 DIAGNOSIS — Z951 Presence of aortocoronary bypass graft: Secondary | ICD-10-CM | POA: Insufficient documentation

## 2015-10-29 DIAGNOSIS — Z09 Encounter for follow-up examination after completed treatment for conditions other than malignant neoplasm: Secondary | ICD-10-CM | POA: Diagnosis present

## 2015-10-29 HISTORY — PX: ESOPHAGEAL BANDING: SHX5518

## 2015-10-29 HISTORY — PX: ESOPHAGOGASTRODUODENOSCOPY: SHX5428

## 2015-10-29 SURGERY — EGD (ESOPHAGOGASTRODUODENOSCOPY)
Anesthesia: Moderate Sedation

## 2015-10-29 MED ORDER — MIDAZOLAM HCL 5 MG/5ML IJ SOLN
INTRAMUSCULAR | Status: DC | PRN
  Administered 2015-10-29 (×2): 2 mg via INTRAVENOUS
  Administered 2015-10-29: 1 mg via INTRAVENOUS
  Administered 2015-10-29: 2 mg via INTRAVENOUS

## 2015-10-29 MED ORDER — BUTAMBEN-TETRACAINE-BENZOCAINE 2-2-14 % EX AERO
INHALATION_SPRAY | CUTANEOUS | Status: DC | PRN
  Administered 2015-10-29: 2 via TOPICAL

## 2015-10-29 MED ORDER — MIDAZOLAM HCL 5 MG/5ML IJ SOLN
INTRAMUSCULAR | Status: AC
  Filled 2015-10-29: qty 10

## 2015-10-29 MED ORDER — STERILE WATER FOR IRRIGATION IR SOLN
Status: DC | PRN
  Administered 2015-10-29: 2.5 mL

## 2015-10-29 MED ORDER — BUTAMBEN-TETRACAINE-BENZOCAINE 2-2-14 % EX AERO
INHALATION_SPRAY | CUTANEOUS | Status: AC
  Filled 2015-10-29: qty 20

## 2015-10-29 MED ORDER — MEPERIDINE HCL 50 MG/ML IJ SOLN
INTRAMUSCULAR | Status: DC | PRN
  Administered 2015-10-29 (×2): 25 mg via INTRAVENOUS

## 2015-10-29 MED ORDER — MEPERIDINE HCL 50 MG/ML IJ SOLN
INTRAMUSCULAR | Status: AC
  Filled 2015-10-29: qty 1

## 2015-10-29 MED ORDER — SODIUM CHLORIDE 0.9 % IV SOLN
INTRAVENOUS | Status: DC
  Administered 2015-10-29: 11:00:00 via INTRAVENOUS

## 2015-10-29 NOTE — H&P (Signed)
Samantha [name] is an 65 y.o. female.   Chief Complaint: Patient is here for EGD EGD and possible esophageal variceal banding. HPI: Patient is 65 year old Caucasian female who has cirrhosis secondary to NASH complicated by esophageal varices which have been banded previously. Last EGD was in September 26 he and in she did not require any banding. She is returning for repeat evaluation and varices are grade 3 of 4 these will be banded. She also complains of dysphagia to pills solids and points to suprasternal area. She therefore may have esophageal web or proximal esophageal narrowing.  Past Medical History:  Diagnosis Date  . CHF (congestive heart failure) ([place])   . Cirrhosis of liver ([place])   . Coronary artery disease    Status post percutaneous coronary intervention and bare metal stenting  of the left circumflex and subsequent percutaneous transluminal coronary anioplasty and placement of a 3.0 NIR bare metal stent proximal to the previous stent.  . Depression   . Esophageal varices ([place])    New 2013  . Gastroesophageal reflux disease   . H/O: hysterectomy   . History of pneumonia   . Hyperlipidemia   . Hypertension   . Obesity   . Osteoarthritis   . Rapid palpitations   . Status post hysterectomy   . Thrombocytopenia ([place])     Past Surgical History:  Procedure Laterality Date  . ABDOMINAL HYSTERECTOMY    . BACK SURGERY    . CARDIAC CATHETERIZATION  2004   left internal mammary artery to the obtuse marginal  was found to be small and thread like.  The two grafts were patent.  The left circumflex had 90% in-stent restenosis and cutting balloon angioplasty was performed followed by placement of a 3.0 x 25m Taxus drug -eluting stent.    .Marland KitchenCARDIAC CATHETERIZATION  2006   There was in-stent restenosis in the left circumflex and this was treated with cutting balloon angioplasty   . CARDIAC CATHETERIZATION  2008   vein graft to the to the obtuse marginal was patent, although small, left  circumflex had 40% in-stent restenosis, ejection fraction 40-45%.  The patient was medically mananged.  .Marland KitchenCARDIAC CATHETERIZATION N/A 01/09/2015   Procedure: Left Heart Cath and Cors/Grafts Angiography;  Surgeon: HBelva Crome MD;  [facility];  Service: Cardiovascular;  Laterality: N/A;  . COLONOSCOPY  03/29/2011   Procedure: COLONOSCOPY;  Surgeon: MJamesetta So MD;  [facility];  Service: Gastroenterology;  Laterality: N/A;  . CORONARY ARTERY BYPASS GRAFT  May 31,2001   x 3 with a vein graft to the first diagonal, vein graft to the right coronary  artery, and a free left internal mammary  artery to the obtuse marginal   . ESOPHAGEAL BANDING N/A 07/04/2012   Procedure: ESOPHAGEAL BANDING;  Surgeon: NRogene Houston MD;  [facility];  Service: Endoscopy;  Laterality: N/A;  . ESOPHAGEAL BANDING N/A 09/17/2012   Procedure: ESOPHAGEAL BANDING;  Surgeon: NRogene Houston MD;  [facility];  Service: Endoscopy;  Laterality: N/A;  . ESOPHAGEAL BANDING N/A 10/22/2013   Procedure: ESOPHAGEAL BANDING;  Surgeon: NRogene Houston MD;  [facility];  Service: Endoscopy;  Laterality: N/A;  . ESOPHAGEAL BANDING N/A 11/28/2014   Procedure: ESOPHAGEAL BANDING;  Surgeon: NRogene Houston MD;  [facility];  Service: Endoscopy;  Laterality: N/A;  . ESOPHAGOGASTRODUODENOSCOPY  12/20/2011   Procedure: ESOPHAGOGASTRODUODENOSCOPY (EGD);  Surgeon: MJamesetta So MD;  [facility];  Service: Gastroenterology;  Laterality: N/A;  . ESOPHAGOGASTRODUODENOSCOPY N/A 07/04/2012   Procedure: ESOPHAGOGASTRODUODENOSCOPY (EGD);  Surgeon: Rogene Houston, MD;  [facility];  Service: Endoscopy;  Laterality: N/A;  235-moved to 255 Ann to notify pt  . ESOPHAGOGASTRODUODENOSCOPY N/A 09/17/2012   Procedure: ESOPHAGOGASTRODUODENOSCOPY (EGD);  Surgeon: Rogene Houston, MD;  [facility];  Service: Endoscopy;  Laterality: N/A;  730  .  ESOPHAGOGASTRODUODENOSCOPY N/A 10/22/2013   Procedure: ESOPHAGOGASTRODUODENOSCOPY (EGD);  Surgeon: Rogene Houston, MD;  [facility];  Service: Endoscopy;  Laterality: N/A;  730  . ESOPHAGOGASTRODUODENOSCOPY N/A 11/28/2014   Procedure: ESOPHAGOGASTRODUODENOSCOPY (EGD);  Surgeon: Rogene Houston, MD;  [facility];  Service: Endoscopy;  Laterality: N/A;  1:25  . JOINT REPLACEMENT    . LEFT HEART CATHETERIZATION WITH CORONARY/GRAFT ANGIOGRAM N/A 12/25/2013   Procedure: LEFT HEART CATHETERIZATION WITH Beatrix Fetters;  Surgeon: Blane Ohara, MD;  Location: [facility] C[facility];  Service: Cardiovascular;  Laterality: N/A;  . rotator cuff left  2007  . TONSILLECTOMY      Family History  Problem Relation Age of Onset  . Heart attack Mother   . Heart attack Father 91    cause of death  . Coronary artery disease Sister     CABG   . Colon cancer Neg Hx    Social History:  reports that she quit smoking about 20 years ago. Her smoking use included Cigarettes. She has a 10.00 pack-year smoking history. She has never used smokeless tobacco. She reports that she does not drink alcohol or use drugs.  Allergies:  Allergies  Allergen Reactions  . Acetaminophen Other (See Comments)    Liver problems  . Oxycodone Itching and Nausea Only  . Ace Inhibitors Other (See Comments)  . Cefaclor Itching and Rash  . Cephalexin Itching and Rash  . Penicillins Rash  . Pregabalin Other (See Comments)    Retains fluid  . Tape Itching and Rash    Takes skin right off with medical tape    Medications Prior to Admission  Medication Sig Dispense Refill  . albuterol (PROVENTIL HFA;VENTOLIN HFA) 108 (90 BASE) MCG/ACT inhaler Inhale 1-2 puffs into the lungs every 6 (six) hours as needed for wheezing or shortness of breath.    [name] aspirin (ASPIRIN EC) 81 MG EC tablet Take 81 mg by mouth daily. Swallow whole.    [name] atorvastatin (LIPITOR) 20 MG tablet Take 20 mg by mouth daily at 6 PM.     .  carvedilol (COREG) 3.125 MG tablet Take 1 tablet (3.125 mg total) by mouth 2 (two) times daily with a meal. 180 tablet 3  . cetirizine (ZYRTEC) 10 MG tablet Take 10 mg by mouth daily as needed for allergies.     . clonazePAM (KLONOPIN) 0.5 MG tablet Take 0.5 mg by mouth at bedtime.     [name] ezetimibe (ZETIA) 10 MG tablet Take 10 mg by mouth at bedtime.     [name] FLUoxetine (PROZAC) 40 MG capsule Take 40 mg by mouth daily.    . furosemide (LASIX) 40 MG tablet Take two tablets by mouth daily, take 1 extra tablet daily for weight gain of 2 lbs overnight. 180 tablet 3  . HYDROcodone-acetaminophen (NORCO) 10-325 MG tablet Take 1 tablet by mouth every 6 (six) hours as needed (for cough also).    . isosorbide mononitrate (IMDUR) 60 MG 24 hr tablet Take 1 tablet (60 mg total) by mouth daily. 30 tablet 11  .  pantoprazole (PROTONIX) 40 MG tablet Take 1 tablet (40 mg total) by mouth 2 (two) times daily. 180 tablet 2  . potassium chloride (K-DUR) 10 MEQ tablet Take 1 tablet (10 mEq total) by mouth daily. 90 tablet 3  . valsartan (DIOVAN) 160 MG tablet Take 80-160 mg by mouth 2 (two) times daily. Take one tab in the morning and 1/2 tab at bed time.    [name] NITROSTAT 0.4 MG SL tablet Place 0.4 mg under the tongue every 5 (five) minutes as needed for chest pain (x 3 tabs daily).       No results found for this or any previous visit (from the past 48 hour(s)). No results found.  ROS  Blood pressure (!) 155/63, pulse 75, temperature 98 F (36.7 C), temperature source Oral, resp. rate 14, height 5' 2"  (1.575 m), weight 212 lb (96.2 kg), last menstrual period 03/29/2011, SpO2 97 %. Physical Exam  Constitutional: She appears well-developed and well-nourished.  HENT:  Mouth/Throat: Oropharynx is clear and moist.  Eyes: Conjunctivae are normal. No scleral icterus.  Neck: No thyromegaly present.  Cardiovascular: Normal rate, regular rhythm and normal heart sounds.   No murmur heard. Respiratory: Effort normal and breath  sounds normal.  GI:  Abdomen is full but soft and nontender without organomegaly or masses.  Musculoskeletal: She exhibits edema (trace edema both legs).  Lymphadenopathy:    She has no cervical adenopathy.  Neurological: She is alert.  Skin: Skin is warm and dry.     Assessment/Plan History of cirrhosis and esophageal varices. Dysphagia to solids and pills. EGD with EGD and possible esophageal variceal banding.  Hildred Laser, MD 10/29/2015, 11:07 AM

## 2015-10-29 NOTE — Op Note (Signed)
[employer] Patient Name: Samantha [name] Procedure Date: 10/29/2015 11:05 AM MRN: [id number] Date of Birth: 15-Dec-1950 Attending MD: Hildred Laser , MD CSN: [id number] Age: 65 Admit Type: Outpatient Procedure:                Upper GI endoscopy Indications:              Esophageal dysphagia, Follow-up of esophageal                            varices, For therapy of esophageal varices Providers:                Hildred Laser, MD, Lurline Del, RN, Purcell Nails. [place],                            Technician Referring MD:             [name] MD, MD Medicines:                Cetacaine spray, Meperidine 50 mg IV, Midazolam 7                            mg IV Complications:            No immediate complications. Estimated Blood Loss:     Estimated blood loss: none. Procedure:                Pre-Anesthesia Assessment:                           - Prior to the procedure, a History and Physical                            was performed, and patient medications and                            allergies were reviewed. The patient's tolerance of                            previous anesthesia was also reviewed. The risks                            and benefits of the procedure and the sedation                            options and risks were discussed with the patient.                            All questions were answered, and informed consent                            was obtained. Prior Anticoagulants: The patient                            last took aspirin 7 days prior to the procedure.  ASA Grade Assessment: III - A patient with severe                            systemic disease. After reviewing the risks and                            benefits, the patient was deemed in satisfactory                            condition to undergo the procedure.                           After obtaining informed consent, the endoscope was                            passed under  direct vision. Throughout the                            procedure, the patient's blood pressure, pulse, and                            oxygen saturations were monitored continuously. The                            EG-[id number] ([id number]) scope was introduced through the                            mouth, and advanced to the second part of duodenum.                            The upper GI endoscopy was accomplished without                            difficulty. The patient tolerated the procedure                            well. Scope In: 11:22:19 AM Scope Out: 11:32:48 AM Total Procedure Duration: 0 hours 10 minutes 29 seconds  Findings:      The upper third of the esophagus and middle third of the esophagus were       normal.      Grade I, grade II varices were found in the lower third of the esophagus.      A post variceal banding scar was found in the lower third of the       esophagus. The scar tissue was healthy in appearance.      No endoscopic abnormality was evident in the esophagus to explain the       patient's complaint of dysphagia. It was decided, however, to proceed       with dilation of the entire esophagus. The dilation site was examined       following endoscope reinsertion and showed no change and no bleeding,       mucosal tear or perforation.      The Z-line was regular and was found 40 cm from the incisors.  Moderate portal hypertensive gastropathy was found in the gastric fundus       and in the gastric body.      The exam of the stomach was otherwise normal.      The duodenal bulb and second portion of the duodenum were normal. Impression:               - Normal upper third of esophagus and middle third                            of esophagus.                           - Grade I and grade II esophageal varices(two                            columns not large enough to be banded.                           - Scars in the lower third of the esophagus without                             esophageal stricture.                           - No endoscopic esophageal abnormality to explain                            patient's dysphagia. Esophagus dilated.                           - Z-line regular, 40 cm from the incisors.                           - Portal hypertensive gastropathy.                           - Normal duodenal bulb and second portion of the                            duodenum.                           - No specimens collected. Moderate Sedation:      Moderate (conscious) sedation was administered by the endoscopy nurse       and supervised by the endoscopist. The following parameters were       monitored: oxygen saturation, heart rate, blood pressure, CO2       capnography and response to care. Total physician intraservice time was       16 minutes. Recommendation:           - Patient has a contact number available for                            emergencies. The signs and symptoms of potential  delayed complications were discussed with the                            patient. Return to normal activities tomorrow.                            Written discharge instructions were provided to the                            patient.                           - Patient has a contact number available for                            emergencies. The signs and symptoms of potential                            delayed complications were discussed with the                            patient. Return to normal activities tomorrow.                            Written discharge instructions were provided to the                            patient.                           - Resume previous diet today.                           - Continue present medications.                           - Resume aspirin at prior dose today.                           - Repeat upper endoscopy in 6 months for                            surveillance.                            - Return to GI clinic in 6 months. Procedure Code(s):        --- Professional ---                           [phone], Esophagogastroduodenoscopy, flexible,                            transoral; diagnostic, including collection of                            specimen(s) by brushing or washing, when performed                            (  separate procedure)                           99152, Moderate sedation services provided by the                            same physician or other qualified health care                            professional performing the diagnostic or                            therapeutic service that the sedation supports,                            requiring the presence of an independent trained                            observer to assist in the monitoring of the                            patient's level of consciousness and physiological                            status; initial 15 minutes of intraservice time,                            patient age 94 years or older Diagnosis Code(s):        --- Professional ---                           I85.00, Esophageal varices without bleeding                           K22.8, Other specified diseases of esophagus                           K76.6, Portal hypertension                           K31.89, Other diseases of stomach and duodenum                           R13.14, Dysphagia, pharyngoesophageal phase CPT copyright 2016 American [employer]. All rights reserved. The codes documented in this report are preliminary and upon coder review may  be revised to meet current compliance requirements. Hildred Laser, MD Hildred Laser, MD 10/29/2015 11:44:31 AM This report has been signed electronically. Number of Addenda: 0

## 2015-10-29 NOTE — Discharge Instructions (Signed)
Resume usual medications including aspirin. Resume usual diet. No driving for 24 hours. Please call office with progress report in one week. Office visit in 6 months.    Esophagogastroduodenoscopy, Care After Refer to this sheet in the next few weeks. These instructions provide you with information about caring for yourself after your procedure. Your health care provider may also give you more specific instructions. Your treatment has been planned according to current medical practices, but problems sometimes occur. Call your health care provider if you have any problems or questions after your procedure. WHAT TO EXPECT AFTER THE PROCEDURE After your procedure, it is typical to feel:  Soreness in your throat.  Pain with swallowing.  Sick to your stomach (nauseous).  Bloated.  Dizzy.  Fatigued. HOME CARE INSTRUCTIONS  Do not eat or drink anything until the numbing medicine (local anesthetic) has worn off and your gag reflex has returned. You will know that the local anesthetic has worn off when you can swallow comfortably.  Do not drive or operate machinery until directed by your health care provider.  Take medicines only as directed by your health care provider. SEEK MEDICAL CARE IF:   You cannot stop coughing.  You are not urinating at all or less than usual. SEEK IMMEDIATE MEDICAL CARE IF:  You have difficulty swallowing.  You cannot eat or drink.  You have worsening throat or chest pain.  You have dizziness or lightheadedness or you faint.  You have nausea or vomiting.  You have chills.  You have a fever.  You have severe abdominal pain.  You have black, tarry, or bloody stools.   This information is not intended to replace advice given to you by your health care provider. Make sure you discuss any questions you have with your health care provider.   Document Released: 02/01/2012 Document Revised: 03/07/2014 Document Reviewed: 02/01/2012 Elsevier  Interactive Patient Education [employer].

## 2015-10-30 DIAGNOSIS — Z23 Encounter for immunization: Secondary | ICD-10-CM | POA: Diagnosis not present

## 2015-11-03 ENCOUNTER — Encounter (HOSPITAL_COMMUNITY): Payer: Self-pay | Admitting: Internal Medicine

## 2015-11-03 DIAGNOSIS — G4709 Other insomnia: Secondary | ICD-10-CM | POA: Diagnosis not present

## 2015-11-03 DIAGNOSIS — E119 Type 2 diabetes mellitus without complications: Secondary | ICD-10-CM | POA: Diagnosis not present

## 2015-11-03 DIAGNOSIS — I10 Essential (primary) hypertension: Secondary | ICD-10-CM | POA: Diagnosis not present

## 2015-11-03 DIAGNOSIS — F3342 Major depressive disorder, recurrent, in full remission: Secondary | ICD-10-CM | POA: Diagnosis not present

## 2015-11-03 DIAGNOSIS — D696 Thrombocytopenia, unspecified: Secondary | ICD-10-CM | POA: Diagnosis not present

## 2015-11-03 DIAGNOSIS — Z6839 Body mass index (BMI) 39.0-39.9, adult: Secondary | ICD-10-CM | POA: Diagnosis not present

## 2015-11-03 DIAGNOSIS — Z1389 Encounter for screening for other disorder: Secondary | ICD-10-CM | POA: Diagnosis not present

## 2015-11-03 DIAGNOSIS — D649 Anemia, unspecified: Secondary | ICD-10-CM | POA: Diagnosis not present

## 2015-11-16 ENCOUNTER — Encounter (INDEPENDENT_AMBULATORY_CARE_PROVIDER_SITE_OTHER): Payer: Self-pay | Admitting: *Deleted

## 2015-12-21 ENCOUNTER — Other Ambulatory Visit (INDEPENDENT_AMBULATORY_CARE_PROVIDER_SITE_OTHER): Payer: Self-pay | Admitting: Internal Medicine

## 2015-12-21 DIAGNOSIS — K7469 Other cirrhosis of liver: Secondary | ICD-10-CM

## 2015-12-24 DIAGNOSIS — Z1231 Encounter for screening mammogram for malignant neoplasm of breast: Secondary | ICD-10-CM | POA: Diagnosis not present

## 2015-12-29 ENCOUNTER — Ambulatory Visit (HOSPITAL_COMMUNITY): Payer: Medicare Other

## 2015-12-29 DIAGNOSIS — W01190A Fall on same level from slipping, tripping and stumbling with subsequent striking against furniture, initial encounter: Secondary | ICD-10-CM | POA: Diagnosis not present

## 2015-12-29 DIAGNOSIS — Z79899 Other long term (current) drug therapy: Secondary | ICD-10-CM | POA: Diagnosis not present

## 2015-12-29 DIAGNOSIS — F419 Anxiety disorder, unspecified: Secondary | ICD-10-CM | POA: Diagnosis not present

## 2015-12-29 DIAGNOSIS — K219 Gastro-esophageal reflux disease without esophagitis: Secondary | ICD-10-CM | POA: Diagnosis not present

## 2015-12-29 DIAGNOSIS — F329 Major depressive disorder, single episode, unspecified: Secondary | ICD-10-CM | POA: Diagnosis not present

## 2015-12-29 DIAGNOSIS — S0003XA Contusion of scalp, initial encounter: Secondary | ICD-10-CM | POA: Diagnosis not present

## 2015-12-29 DIAGNOSIS — M1990 Unspecified osteoarthritis, unspecified site: Secondary | ICD-10-CM | POA: Diagnosis not present

## 2015-12-29 DIAGNOSIS — R51 Headache: Secondary | ICD-10-CM | POA: Diagnosis not present

## 2015-12-29 DIAGNOSIS — K7460 Unspecified cirrhosis of liver: Secondary | ICD-10-CM | POA: Diagnosis not present

## 2015-12-29 DIAGNOSIS — Z951 Presence of aortocoronary bypass graft: Secondary | ICD-10-CM | POA: Diagnosis not present

## 2015-12-29 DIAGNOSIS — I252 Old myocardial infarction: Secondary | ICD-10-CM | POA: Diagnosis not present

## 2015-12-29 DIAGNOSIS — S0081XA Abrasion of other part of head, initial encounter: Secondary | ICD-10-CM | POA: Diagnosis not present

## 2015-12-29 DIAGNOSIS — S40021A Contusion of right upper arm, initial encounter: Secondary | ICD-10-CM | POA: Diagnosis not present

## 2015-12-29 DIAGNOSIS — S40022A Contusion of left upper arm, initial encounter: Secondary | ICD-10-CM | POA: Diagnosis not present

## 2015-12-29 DIAGNOSIS — S80211A Abrasion, right knee, initial encounter: Secondary | ICD-10-CM | POA: Diagnosis not present

## 2015-12-29 DIAGNOSIS — Z7982 Long term (current) use of aspirin: Secondary | ICD-10-CM | POA: Diagnosis not present

## 2016-01-04 ENCOUNTER — Ambulatory Visit (HOSPITAL_COMMUNITY)
Admission: RE | Admit: 2016-01-04 | Discharge: 2016-01-04 | Disposition: A | Discharge status: Home / Self Care | Payer: Medicare Other | Source: Ambulatory Visit | Attending: Internal Medicine | Admitting: Internal Medicine

## 2016-01-04 DIAGNOSIS — R161 Splenomegaly, not elsewhere classified: Secondary | ICD-10-CM | POA: Diagnosis not present

## 2016-01-04 DIAGNOSIS — K7469 Other cirrhosis of liver: Secondary | ICD-10-CM

## 2016-01-04 DIAGNOSIS — K7460 Unspecified cirrhosis of liver: Secondary | ICD-10-CM | POA: Diagnosis not present

## 2016-01-04 DIAGNOSIS — K8689 Other specified diseases of pancreas: Secondary | ICD-10-CM | POA: Diagnosis not present

## 2016-01-05 DIAGNOSIS — C672 Malignant neoplasm of lateral wall of bladder: Secondary | ICD-10-CM | POA: Diagnosis not present

## 2016-01-05 DIAGNOSIS — R8299 Other abnormal findings in urine: Secondary | ICD-10-CM | POA: Diagnosis not present

## 2016-01-06 ENCOUNTER — Other Ambulatory Visit (INDEPENDENT_AMBULATORY_CARE_PROVIDER_SITE_OTHER): Payer: Self-pay | Admitting: *Deleted

## 2016-01-06 DIAGNOSIS — K7460 Unspecified cirrhosis of liver: Secondary | ICD-10-CM

## 2016-01-07 ENCOUNTER — Encounter (INDEPENDENT_AMBULATORY_CARE_PROVIDER_SITE_OTHER): Payer: Self-pay | Admitting: *Deleted

## 2016-01-07 ENCOUNTER — Other Ambulatory Visit (INDEPENDENT_AMBULATORY_CARE_PROVIDER_SITE_OTHER): Payer: Self-pay | Admitting: Internal Medicine

## 2016-01-07 DIAGNOSIS — K7469 Other cirrhosis of liver: Secondary | ICD-10-CM

## 2016-01-07 DIAGNOSIS — R935 Abnormal findings on diagnostic imaging of other abdominal regions, including retroperitoneum: Secondary | ICD-10-CM

## 2016-01-08 LAB — AFP TUMOR MARKER: AFP-Tumor Marker: 3.6 ng/mL (ref <6.1)

## 2016-01-13 ENCOUNTER — Other Ambulatory Visit (INDEPENDENT_AMBULATORY_CARE_PROVIDER_SITE_OTHER): Payer: Self-pay | Admitting: *Deleted

## 2016-01-13 ENCOUNTER — Ambulatory Visit (HOSPITAL_COMMUNITY)
Admission: RE | Admit: 2016-01-13 | Discharge: 2016-01-13 | Disposition: A | Discharge status: Home / Self Care | Payer: Medicare Other | Source: Ambulatory Visit | Attending: Internal Medicine | Admitting: Internal Medicine

## 2016-01-13 DIAGNOSIS — K7469 Other cirrhosis of liver: Secondary | ICD-10-CM

## 2016-01-13 DIAGNOSIS — K922 Gastrointestinal hemorrhage, unspecified: Secondary | ICD-10-CM

## 2016-01-13 DIAGNOSIS — K766 Portal hypertension: Secondary | ICD-10-CM | POA: Insufficient documentation

## 2016-01-13 DIAGNOSIS — R935 Abnormal findings on diagnostic imaging of other abdominal regions, including retroperitoneum: Secondary | ICD-10-CM

## 2016-01-13 DIAGNOSIS — K7460 Unspecified cirrhosis of liver: Secondary | ICD-10-CM | POA: Diagnosis not present

## 2016-01-13 LAB — POCT I-STAT, CHEM 8
BUN: 9 mg/dL (ref 6–20)
Calcium, Ion: 1.19 mmol/L (ref 1.15–1.40)
Chloride: 102 mmol/L (ref 101–111)
Creatinine, Ser: 1.1 mg/dL — ABNORMAL HIGH (ref 0.44–1.00)
Glucose, Bld: 95 mg/dL (ref 65–99)
HCT: 44 % (ref 36.0–46.0)
Hemoglobin: 15 g/dL (ref 12.0–15.0)
Potassium: 4.9 mmol/L (ref 3.5–5.1)
Sodium: 141 mmol/L (ref 135–145)
TCO2: 27 mmol/L (ref 0–100)

## 2016-01-13 MED ORDER — GADOXETATE DISODIUM 0.25 MMOL/ML IV SOLN
10.0000 mL | Freq: Once | INTRAVENOUS | Status: AC | PRN
  Administered 2016-01-13: 10 mL via INTRAVENOUS

## 2016-02-10 DIAGNOSIS — M545 Low back pain: Secondary | ICD-10-CM | POA: Diagnosis not present

## 2016-02-10 DIAGNOSIS — Z6836 Body mass index (BMI) 36.0-36.9, adult: Secondary | ICD-10-CM | POA: Diagnosis not present

## 2016-03-09 DIAGNOSIS — D649 Anemia, unspecified: Secondary | ICD-10-CM | POA: Diagnosis not present

## 2016-03-09 DIAGNOSIS — E1140 Type 2 diabetes mellitus with diabetic neuropathy, unspecified: Secondary | ICD-10-CM | POA: Diagnosis not present

## 2016-03-09 DIAGNOSIS — E782 Mixed hyperlipidemia: Secondary | ICD-10-CM | POA: Diagnosis not present

## 2016-03-09 DIAGNOSIS — I10 Essential (primary) hypertension: Secondary | ICD-10-CM | POA: Diagnosis not present

## 2016-03-09 DIAGNOSIS — E7800 Pure hypercholesterolemia, unspecified: Secondary | ICD-10-CM | POA: Diagnosis not present

## 2016-03-11 DIAGNOSIS — G4709 Other insomnia: Secondary | ICD-10-CM | POA: Diagnosis not present

## 2016-03-11 DIAGNOSIS — I10 Essential (primary) hypertension: Secondary | ICD-10-CM | POA: Diagnosis not present

## 2016-03-11 DIAGNOSIS — Z6836 Body mass index (BMI) 36.0-36.9, adult: Secondary | ICD-10-CM | POA: Diagnosis not present

## 2016-03-11 DIAGNOSIS — J0101 Acute recurrent maxillary sinusitis: Secondary | ICD-10-CM | POA: Diagnosis not present

## 2016-03-11 DIAGNOSIS — E119 Type 2 diabetes mellitus without complications: Secondary | ICD-10-CM | POA: Diagnosis not present

## 2016-03-11 DIAGNOSIS — D696 Thrombocytopenia, unspecified: Secondary | ICD-10-CM | POA: Diagnosis not present

## 2016-03-11 DIAGNOSIS — F3342 Major depressive disorder, recurrent, in full remission: Secondary | ICD-10-CM | POA: Diagnosis not present

## 2016-03-11 DIAGNOSIS — D649 Anemia, unspecified: Secondary | ICD-10-CM | POA: Diagnosis not present

## 2016-03-25 DIAGNOSIS — Z6835 Body mass index (BMI) 35.0-35.9, adult: Secondary | ICD-10-CM | POA: Diagnosis not present

## 2016-03-25 DIAGNOSIS — R05 Cough: Secondary | ICD-10-CM | POA: Diagnosis not present

## 2016-03-25 DIAGNOSIS — J209 Acute bronchitis, unspecified: Secondary | ICD-10-CM | POA: Diagnosis not present

## 2016-04-26 ENCOUNTER — Ambulatory Visit (INDEPENDENT_AMBULATORY_CARE_PROVIDER_SITE_OTHER): Payer: Medicare Other | Admitting: Internal Medicine

## 2016-04-26 ENCOUNTER — Encounter (INDEPENDENT_AMBULATORY_CARE_PROVIDER_SITE_OTHER): Payer: Self-pay | Admitting: Internal Medicine

## 2016-04-26 VITALS — BP 116/74 | HR 68 | Temp 97.9°F

## 2016-04-26 DIAGNOSIS — K760 Fatty (change of) liver, not elsewhere classified: Secondary | ICD-10-CM | POA: Diagnosis not present

## 2016-04-26 DIAGNOSIS — I8500 Esophageal varices without bleeding: Secondary | ICD-10-CM

## 2016-04-26 DIAGNOSIS — D696 Thrombocytopenia, unspecified: Secondary | ICD-10-CM | POA: Diagnosis not present

## 2016-04-26 NOTE — Patient Instructions (Signed)
Next ultrasound and AFP in May 2018.

## 2016-04-26 NOTE — Progress Notes (Signed)
Presenting complaint;  Follow-up for chronic liver disease.  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 She was last seen in July 2017 when she weight 221 pounds. She has lost 26 pounds since her last visit. She says she has changed her eating habits. She was going to [employer] but since she has been busy with her church on daily basis she is not going to [employer] anymore. She feels a lot better. She says she had blood work by Dr. Quintin Alto and LFTs were normal.  She denies abdominal pain nausea vomiting melena or rectal bleeding. She states heartburn is well controlled with pantoprazole. She states her younger sister who had wedge resection for [facility] is doing well.   Current Medications: Outpatient Encounter Prescriptions as of 04/26/2016  Medication Sig  . albuterol (PROVENTIL HFA;VENTOLIN HFA) 108 (90 BASE) MCG/ACT inhaler Inhale 1-2 puffs into the lungs every 6 (six) hours as needed for wheezing or shortness of breath.  [name] aspirin (ASPIRIN EC) 81 MG EC tablet Take 81 mg by mouth daily. Swallow whole.  [name] atorvastatin (LIPITOR) 20 MG tablet Take 20 mg by mouth daily at 6 PM.   . carvedilol (COREG) 3.125 MG tablet Take 1 tablet (3.125 mg total) by mouth 2 (two) times daily with a meal.  . cetirizine (ZYRTEC) 10 MG tablet Take 10 mg by mouth daily as needed for allergies.   . clonazePAM (KLONOPIN) 0.5 MG tablet Take 0.5 mg by mouth at bedtime.   [name] ezetimibe (ZETIA) 10 MG tablet Take 10 mg by mouth at bedtime.   [name] FLUoxetine (PROZAC) 40 MG capsule Take 40 mg by mouth daily.  . furosemide (LASIX) 40 MG tablet Take two tablets by mouth daily, take 1 extra tablet daily for weight gain of 2 lbs overnight.  [name] HYDROcodone-acetaminophen (NORCO) 10-325 MG tablet Take 1 tablet by mouth every 6 (six) hours as needed (for cough also).  [name] NITROSTAT 0.4 MG SL tablet Place  0.4 mg under the tongue every 5 (five) minutes as needed for chest pain (x 3 tabs daily).   . pantoprazole (PROTONIX) 40 MG tablet Take 1 tablet (40 mg total) by mouth 2 (two) times daily.  . potassium chloride (K-DUR) 10 MEQ tablet Take 1 tablet (10 mEq total) by mouth daily.  . valsartan (DIOVAN) 160 MG tablet Take 80-160 mg by mouth 2 (two) times daily. Patient states that she takes 160 mg in the morning , and 80 mg at night.  . isosorbide mononitrate (IMDUR) 60 MG 24 hr tablet Take 1 tablet (60 mg total) by mouth daily.   No facility-administered encounter medications on file as of 04/26/2016.      Objective: Blood pressure 116/74, pulse 68, temperature 97.9 F (36.6 C), temperature source Oral, last menstrual period 03/29/2011. Patient is alert and in no acute distress. Asterixis absent. Conjunctiva is pink. Sclera is nonicteric Oropharyngeal mucosa is normal. No neck masses or thyromegaly noted. Cardiac exam with regular rhythm normal S1 and S2. No murmur or gallop noted. Lungs are clear to auscultation. Abdomen is full. It is soft on palpation with easily palpable spleen. From left lobe of the liver. No tenderness noted. No LE edema or clubbing noted.  Labs/studies Results: AFP was 3.6 on 01/06/2016   Ultrasound on 01/04/2016 revealed splenomegaly  Fatty liver with contour irregularity and 9 x 9 x 7 mm  hyperechoic focus in left lobe.  MR liver on 01/13/2016 revealed cirrhotic liver and evidence of portal hypertension but no hypervascular lesions to suggest hepatoma.    Assessment:  #1. Cirrhosis secondary to NASH. Her disease has been complicated by esophageal varices and thrombocytopenia but hepatic function remains well preserved. Serum albumin in July 2017 was 3.8. #2. Esophageal varices. She underwent esophageal variceal banding in May 2014, July 2014 and August 2015. She did not require banding on EGD of September 2016 and August 2017. Doubt that she has lost  significant weight her condition may have improved with decrease in portal pressure. #3. Thrombocytopenia secondary to cirrhosis and splenomegaly.   Plan:  Will request copy of recent blood work from Dr. Edythe Lynn office. Patient encouraged to continue current lifestyle so that he can lose a few more pounds. She will have AFP abdominal ultrasound with Doppler study in May 2018. Office visit in 6 months. Will consider EGD following her next visit.

## 2016-05-05 ENCOUNTER — Other Ambulatory Visit: Payer: Self-pay | Admitting: Cardiovascular Disease

## 2016-05-30 ENCOUNTER — Encounter: Payer: Self-pay | Admitting: Cardiology

## 2016-06-06 DIAGNOSIS — R05 Cough: Secondary | ICD-10-CM | POA: Diagnosis not present

## 2016-06-06 DIAGNOSIS — K210 Gastro-esophageal reflux disease with esophagitis: Secondary | ICD-10-CM | POA: Diagnosis not present

## 2016-06-06 DIAGNOSIS — Z6833 Body mass index (BMI) 33.0-33.9, adult: Secondary | ICD-10-CM | POA: Diagnosis not present

## 2016-06-06 DIAGNOSIS — I10 Essential (primary) hypertension: Secondary | ICD-10-CM | POA: Diagnosis not present

## 2016-06-06 DIAGNOSIS — M545 Low back pain: Secondary | ICD-10-CM | POA: Diagnosis not present

## 2016-06-13 ENCOUNTER — Ambulatory Visit (INDEPENDENT_AMBULATORY_CARE_PROVIDER_SITE_OTHER): Payer: Medicare Other | Admitting: Cardiovascular Disease

## 2016-06-13 ENCOUNTER — Encounter: Payer: Self-pay | Admitting: Cardiovascular Disease

## 2016-06-13 VITALS — BP 128/66 | HR 88 | Ht 62.0 in | Wt 198.1 lb

## 2016-06-13 DIAGNOSIS — I4892 Unspecified atrial flutter: Secondary | ICD-10-CM | POA: Diagnosis not present

## 2016-06-13 DIAGNOSIS — I209 Angina pectoris, unspecified: Secondary | ICD-10-CM

## 2016-06-13 DIAGNOSIS — I25119 Atherosclerotic heart disease of native coronary artery with unspecified angina pectoris: Secondary | ICD-10-CM | POA: Diagnosis not present

## 2016-06-13 MED ORDER — CARVEDILOL 6.25 MG PO TABS: 6.2500 mg | ORAL_TABLET | Freq: Two times a day (BID) | ORAL | 3 refills | Status: DC

## 2016-06-13 NOTE — Patient Instructions (Signed)
Medication Instructions:  Your physician has recommended you make the following change in your medication:  1. INCREASE Carvedilol to 6.76m take one tablet by mouth twice a day  Labwork: No new orders.   Testing/Procedures: No new orders.   Follow-Up: Your physician wants you to follow-up in: 6 MONTHS with Dr CBurt Knack  You will receive a reminder letter in the mail two months in advance. If you don't receive a letter, please call our office to schedule the follow-up appointment.   Any Other Special Instructions Will Be Listed Below (If Applicable).     If you need a refill on your cardiac medications before your next appointment, please call your pharmacy.

## 2016-06-13 NOTE — Progress Notes (Signed)
Cardiology Office Note Date:  06/13/2016   ID:  Samantha [name], Samantha [name] 12-01-1950, MRN [id number]  PCP:  [name], MD  Cardiologist:  Sherren Mocha, MD    Chief Complaint  Patient presents with  . SVT    follow up     History of Present Illness: Samantha [name] is a 66 y.o. female who presents for follow-up evaluation. The patient has a hx of CAD s/p CABG in 2542, chronic systolic heart failure, and multiple PCI procedures. 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 She was hospitalized in 2016 with congestive heart failure requiring IV diuresis.   The patient is here alone today. Continues to have some episodes of chest discomfort, mostly at night. No exertional chest pain. Complains of fatigue and dyspnea with exertion. No orthopnea or PND. No recent problems with GI bleeding or need for transfusion.     Past Medical History:  Diagnosis Date  . CHF (congestive heart failure) ([place])   . Cirrhosis of liver ([place])   . Coronary artery disease    Status post percutaneous coronary intervention and bare metal stenting  of the left circumflex and subsequent percutaneous transluminal coronary anioplasty and placement of a 3.0 NIR bare metal stent proximal to the previous stent.  . Depression   . Esophageal varices ([place])    New 2013  . Gastroesophageal reflux disease   . H/O: hysterectomy   . History of pneumonia   . Hyperlipidemia   . Hypertension   . Obesity   . Osteoarthritis   . Rapid palpitations   . Status post hysterectomy   . Thrombocytopenia ([place])     Past Surgical History:  Procedure Laterality Date  . ABDOMINAL HYSTERECTOMY    . BACK SURGERY    . CARDIAC CATHETERIZATION  2004   left internal mammary artery to the obtuse marginal  was found to be small and thread like.  The two grafts were patent.  The left circumflex had 90% in-stent restenosis and cutting balloon angioplasty was performed followed by placement of a 3.0 x 60m  Taxus drug -eluting stent.    .Marland KitchenCARDIAC CATHETERIZATION  2006   There was in-stent restenosis in the left circumflex and this was treated with cutting balloon angioplasty   . CARDIAC CATHETERIZATION  2008   vein graft to the to the obtuse marginal was patent, although small, left circumflex had 40% in-stent restenosis, ejection fraction 40-45%.  The patient was medically mananged.  .Marland KitchenCARDIAC CATHETERIZATION N/A 01/09/2015   Procedure: Left Heart Cath and Cors/Grafts Angiography;  Surgeon: HBelva Crome MD;  Location: [facility];  Service: Cardiovascular;  Laterality: N/A;  . COLONOSCOPY  03/29/2011   Procedure: COLONOSCOPY;  Surgeon: MJamesetta So MD;  [facility];  Service: Gastroenterology;  Laterality: N/A;  . CORONARY ARTERY BYPASS GRAFT  May 31,2001   x 3 with a vein graft to the first diagonal, vein graft to the right coronary  artery, and a free left internal mammary  artery to the obtuse marginal   . ESOPHAGEAL BANDING N/A 07/04/2012   Procedure: ESOPHAGEAL BANDING;  Surgeon: NRogene Houston MD;  [facility];  Service: Endoscopy;  Laterality: N/A;  . ESOPHAGEAL BANDING N/A 09/17/2012   Procedure: ESOPHAGEAL BANDING;  Surgeon: NRogene Houston MD;  [facility];  Service: Endoscopy;  Laterality: N/A;  . ESOPHAGEAL BANDING N/A 10/22/2013   Procedure: ESOPHAGEAL BANDING;  Surgeon: NRogene Houston MD;  [facility];  Service: Endoscopy;  Laterality: N/A;  . ESOPHAGEAL BANDING N/A 11/28/2014   Procedure: ESOPHAGEAL BANDING;  Surgeon: Rogene Houston, MD;  [facility];  Service: Endoscopy;  Laterality: N/A;  . ESOPHAGEAL BANDING N/A 10/29/2015   Procedure: ESOPHAGEAL BANDING;  Surgeon: Rogene Houston, MD;  [facility];  Service: Endoscopy;  Laterality: N/A;  . ESOPHAGOGASTRODUODENOSCOPY  12/20/2011   Procedure: ESOPHAGOGASTRODUODENOSCOPY (EGD);  Surgeon: Jamesetta So, MD;  [facility];  Service:  Gastroenterology;  Laterality: N/A;  . ESOPHAGOGASTRODUODENOSCOPY N/A 07/04/2012   Procedure: ESOPHAGOGASTRODUODENOSCOPY (EGD);  Surgeon: Rogene Houston, MD;  [facility];  Service: Endoscopy;  Laterality: N/A;  235-moved to 255 Ann to notify pt  . ESOPHAGOGASTRODUODENOSCOPY N/A 09/17/2012   Procedure: ESOPHAGOGASTRODUODENOSCOPY (EGD);  Surgeon: Rogene Houston, MD;  [facility];  Service: Endoscopy;  Laterality: N/A;  730  . ESOPHAGOGASTRODUODENOSCOPY N/A 10/22/2013   Procedure: ESOPHAGOGASTRODUODENOSCOPY (EGD);  Surgeon: Rogene Houston, MD;  [facility];  Service: Endoscopy;  Laterality: N/A;  730  . ESOPHAGOGASTRODUODENOSCOPY N/A 11/28/2014   Procedure: ESOPHAGOGASTRODUODENOSCOPY (EGD);  Surgeon: Rogene Houston, MD;  [facility];  Service: Endoscopy;  Laterality: N/A;  1:25  . ESOPHAGOGASTRODUODENOSCOPY N/A 10/29/2015   Procedure: ESOPHAGOGASTRODUODENOSCOPY (EGD);  Surgeon: Rogene Houston, MD;  [facility];  Service: Endoscopy;  Laterality: N/A;  12:00  . JOINT REPLACEMENT    . LEFT HEART CATHETERIZATION WITH CORONARY/GRAFT ANGIOGRAM N/A 12/25/2013   Procedure: LEFT HEART CATHETERIZATION WITH Beatrix Fetters;  Surgeon: Blane Ohara, MD;  Location: [facility];  Service: Cardiovascular;  Laterality: N/A;  . rotator cuff left  2007  . TONSILLECTOMY      Current Outpatient Prescriptions  Medication Sig Dispense Refill  . albuterol (PROVENTIL HFA;VENTOLIN HFA) 108 (90 BASE) MCG/ACT inhaler Inhale 1-2 puffs into the lungs every 6 (six) hours as needed for wheezing or shortness of breath.    [name] aspirin (ASPIRIN EC) 81 MG EC tablet Take 81 mg by mouth daily. Swallow whole.    [name] atorvastatin (LIPITOR) 20 MG tablet Take 20 mg by mouth daily at 6 PM.     . carvedilol (COREG) 3.125 MG tablet Take 1 tablet (3.125 mg total) by mouth 2 (two) times daily with a meal. 180 tablet 3  . cetirizine (ZYRTEC) 10 MG tablet Take 10 mg by mouth  daily as needed for allergies.     . clonazePAM (KLONOPIN) 0.5 MG tablet Take 0.5 mg by mouth at bedtime.     [name] ezetimibe (ZETIA) 10 MG tablet Take 10 mg by mouth at bedtime.     [name] FLUoxetine (PROZAC) 40 MG capsule Take 40 mg by mouth daily.    . furosemide (LASIX) 40 MG tablet TAKE TWO TABLETS BY MOUTH ONCE DAILY. TAKE 1 EXTRA TABLET DAILY FOR WEIGHT GAIN OF 2 LBS OVERNIGHT 180 tablet 0  . HYDROcodone-acetaminophen (NORCO) 10-325 MG tablet Take 1 tablet by mouth every 6 (six) hours as needed (for cough also).    [name] NITROSTAT 0.4 MG SL tablet Place 0.4 mg under the tongue every 5 (five) minutes as needed for chest pain (x 3 tabs daily).     . pantoprazole (PROTONIX) 40 MG tablet Take 1 tablet (40 mg total) by mouth 2 (two) times daily. 180 tablet 2  . potassium chloride (K-DUR) 10 MEQ tablet Take 1 tablet (10 mEq total) by mouth daily. 90 tablet 3  . valsartan (  DIOVAN) 160 MG tablet Take 80-160 mg by mouth 2 (two) times daily. Patient states that she takes 160 mg in the morning , and 80 mg at night.    . isosorbide mononitrate (IMDUR) 60 MG 24 hr tablet Take 1 tablet (60 mg total) by mouth daily. 30 tablet 11   No current facility-administered medications for this visit.     Allergies:   Acetaminophen; Oxycodone; Ace inhibitors; Cefaclor; Cephalexin; Penicillins; Pregabalin; and Tape   Social History:  The patient  reports that she quit smoking about 20 years ago. Her smoking use included Cigarettes. She has a 10.00 pack-year smoking history. She has never used smokeless tobacco. She reports that she does not drink alcohol or use drugs.   Family History:  The patient's  family history includes Coronary artery disease in her sister; Heart attack in her mother; Heart attack (age of onset: 29) in her father.    ROS:  Please see the history of present illness.  Otherwise, review of systems is positive for cough, back pain, leg swelling.  All other systems are reviewed and negative.    PHYSICAL  EXAM: VS:  BP 128/66   Pulse 88   Ht 5' 2"  (1.575 m)   Wt 198 lb 1.9 oz (89.9 kg)   LMP 03/29/2011   BMI 36.24 kg/m  , BMI Body mass index is 36.24 kg/m. GEN: Well nourished, well developed, in no acute distress  HEENT: normal  Neck: no JVD, no masses. No carotid bruits Cardiac: irregularly irregular without murmur or gallop    Respiratory:  clear to auscultation bilaterally, normal work of breathing GI: soft, nontender, nondistended, + BS MS: no deformity or atrophy  Ext: trace bilateral pretibial edema, pedal pulses 2+= bilaterally Skin: warm and dry, no rash Neuro:  Strength and sensation are intact Psych: euthymic mood, full affect  EKG:  EKG is ordered today. The ekg ordered today shows atrial flutter with variable AV block 89 bpm, nonspecific IVCD  Recent Labs: 09/08/2015: ALT 16; Platelets 85 01/13/2016: BUN 9; Creatinine, Ser 1.10; Hemoglobin 15.0; Potassium 4.9; Sodium 141   Lipid Panel     Component Value Date/Time   CHOL 135 01/09/2015 0455   TRIG 119 01/09/2015 0455   HDL 37 (L) 01/09/2015 0455   CHOLHDL 3.6 01/09/2015 0455   VLDL 24 01/09/2015 0455   LDLCALC 74 01/09/2015 0455      Wt Readings from Last 3 Encounters:  06/13/16 198 lb 1.9 oz (89.9 kg)  10/29/15 212 lb (96.2 kg)  09/21/15 220 lb 12.8 oz (100.2 kg)     Cardiac Studies Reviewed: Cardiac Cath 01-09-2015: Conclusion    Bypass graft anatomy and patency is unchanged compared to the most recent study performed in 2015. Patent saphenous vein graft to the right coronary. Patent saphenous vein graft to the diagonal. Atretic free left internal mammary graft to the circumflex marginal. The internal mammary artery was formed as a man-made-Y graft.  High-grade stenosis in the proximal native right coronary.   Widely patent proximal to mid circumflex stent, unchanged from prior study  50% ostial first diagonal, unchanged from prior study.  Widely patent left main.   Left heart failure with  estimated ejection fraction 35-40% with elevated left ventricular end-diastolic pressure (27 mmHg) consistent with acute on chronic physiology .  RECOMMENDATIONS:   Treat heart failure with adjustment in medical regimen.  40 mg of Lasix intravenously at the completion of the procedure.  Will likely benefit from a more aggressive diuretic  regimen as now patient     ASSESSMENT AND PLAN: 1.  Chronic systolic heart failure, NYHA 2: continue furosemide, coreg, valsartan. Increase coreg 6.25 mg BID  2. CAD, native vessel, with angina: overall stable. Last cath study reviewed. Increase carvedilol otherwise no change  3. HTN: BP controlled.   4. Hyperlipidemia: treated with atorvastatin and ezetimibe.  5. Atrial flutter: heart rate controlled. CHADS-Vasc = 5.  I don't think she is a candidate for anticoagulation with cirrhosis, varices. Will touch base with Dr Laural Golden to discuss consider of Watchman LAA appendage occlusion versus continuation of ASA.  Current medicines are reviewed with the patient today.  The patient does not have concerns regarding medicines.  Labs/ tests ordered today include:  No orders of the defined types were placed in this encounter.   Disposition:   FU 6 months  Signed, Sherren Mocha, MD  06/13/2016 10:05 AM    [address], [place], [place]  [address] Phone: [phone]; Fax: [phone]

## 2016-06-24 ENCOUNTER — Encounter (INDEPENDENT_AMBULATORY_CARE_PROVIDER_SITE_OTHER): Payer: Self-pay | Admitting: *Deleted

## 2016-06-24 ENCOUNTER — Other Ambulatory Visit (INDEPENDENT_AMBULATORY_CARE_PROVIDER_SITE_OTHER): Payer: Self-pay | Admitting: *Deleted

## 2016-06-24 DIAGNOSIS — K7460 Unspecified cirrhosis of liver: Secondary | ICD-10-CM

## 2016-06-24 DIAGNOSIS — K922 Gastrointestinal hemorrhage, unspecified: Secondary | ICD-10-CM

## 2016-07-07 ENCOUNTER — Encounter (INDEPENDENT_AMBULATORY_CARE_PROVIDER_SITE_OTHER): Payer: Self-pay | Admitting: *Deleted

## 2016-07-07 ENCOUNTER — Other Ambulatory Visit (INDEPENDENT_AMBULATORY_CARE_PROVIDER_SITE_OTHER): Payer: Self-pay | Admitting: Internal Medicine

## 2016-07-12 ENCOUNTER — Ambulatory Visit (INDEPENDENT_AMBULATORY_CARE_PROVIDER_SITE_OTHER): Payer: Medicare Other | Admitting: Urology

## 2016-07-12 DIAGNOSIS — K922 Gastrointestinal hemorrhage, unspecified: Secondary | ICD-10-CM | POA: Diagnosis not present

## 2016-07-12 DIAGNOSIS — C678 Malignant neoplasm of overlapping sites of bladder: Secondary | ICD-10-CM | POA: Diagnosis not present

## 2016-07-12 DIAGNOSIS — K7460 Unspecified cirrhosis of liver: Secondary | ICD-10-CM | POA: Diagnosis not present

## 2016-07-13 LAB — AFP TUMOR MARKER: AFP-Tumor Marker: 2.8 ng/mL (ref <6.1)

## 2016-07-18 ENCOUNTER — Ambulatory Visit (HOSPITAL_COMMUNITY)
Admission: RE | Admit: 2016-07-18 | Discharge: 2016-07-18 | Disposition: A | Discharge status: Home / Self Care | Payer: Medicare Other | Source: Ambulatory Visit | Attending: Internal Medicine | Admitting: Internal Medicine

## 2016-07-18 DIAGNOSIS — K7460 Unspecified cirrhosis of liver: Secondary | ICD-10-CM | POA: Diagnosis not present

## 2016-07-21 DIAGNOSIS — J209 Acute bronchitis, unspecified: Secondary | ICD-10-CM | POA: Diagnosis not present

## 2016-07-21 DIAGNOSIS — Z6836 Body mass index (BMI) 36.0-36.9, adult: Secondary | ICD-10-CM | POA: Diagnosis not present

## 2016-07-21 DIAGNOSIS — I10 Essential (primary) hypertension: Secondary | ICD-10-CM | POA: Diagnosis not present

## 2016-07-21 DIAGNOSIS — I25119 Atherosclerotic heart disease of native coronary artery with unspecified angina pectoris: Secondary | ICD-10-CM | POA: Diagnosis not present

## 2016-07-26 DIAGNOSIS — J441 Chronic obstructive pulmonary disease with (acute) exacerbation: Secondary | ICD-10-CM | POA: Diagnosis not present

## 2016-07-26 DIAGNOSIS — R042 Hemoptysis: Secondary | ICD-10-CM | POA: Diagnosis not present

## 2016-07-26 DIAGNOSIS — Z6836 Body mass index (BMI) 36.0-36.9, adult: Secondary | ICD-10-CM | POA: Diagnosis not present

## 2016-07-28 DIAGNOSIS — K210 Gastro-esophageal reflux disease with esophagitis: Secondary | ICD-10-CM | POA: Diagnosis not present

## 2016-07-28 DIAGNOSIS — E782 Mixed hyperlipidemia: Secondary | ICD-10-CM | POA: Diagnosis not present

## 2016-07-28 DIAGNOSIS — I10 Essential (primary) hypertension: Secondary | ICD-10-CM | POA: Diagnosis not present

## 2016-07-28 DIAGNOSIS — E119 Type 2 diabetes mellitus without complications: Secondary | ICD-10-CM | POA: Diagnosis not present

## 2016-07-28 DIAGNOSIS — E7800 Pure hypercholesterolemia, unspecified: Secondary | ICD-10-CM | POA: Diagnosis not present

## 2016-07-28 DIAGNOSIS — E1140 Type 2 diabetes mellitus with diabetic neuropathy, unspecified: Secondary | ICD-10-CM | POA: Diagnosis not present

## 2016-08-01 ENCOUNTER — Other Ambulatory Visit: Payer: Self-pay | Admitting: Cardiovascular Disease

## 2016-08-01 DIAGNOSIS — G4709 Other insomnia: Secondary | ICD-10-CM | POA: Diagnosis not present

## 2016-08-01 DIAGNOSIS — Z6836 Body mass index (BMI) 36.0-36.9, adult: Secondary | ICD-10-CM | POA: Diagnosis not present

## 2016-08-01 DIAGNOSIS — D649 Anemia, unspecified: Secondary | ICD-10-CM | POA: Diagnosis not present

## 2016-08-01 DIAGNOSIS — E119 Type 2 diabetes mellitus without complications: Secondary | ICD-10-CM | POA: Diagnosis not present

## 2016-08-01 DIAGNOSIS — D696 Thrombocytopenia, unspecified: Secondary | ICD-10-CM | POA: Diagnosis not present

## 2016-08-01 DIAGNOSIS — F3342 Major depressive disorder, recurrent, in full remission: Secondary | ICD-10-CM | POA: Diagnosis not present

## 2016-08-01 DIAGNOSIS — I10 Essential (primary) hypertension: Secondary | ICD-10-CM | POA: Diagnosis not present

## 2016-08-01 DIAGNOSIS — D72819 Decreased white blood cell count, unspecified: Secondary | ICD-10-CM | POA: Diagnosis not present

## 2016-08-08 ENCOUNTER — Encounter (INDEPENDENT_AMBULATORY_CARE_PROVIDER_SITE_OTHER): Payer: Self-pay

## 2016-08-11 DIAGNOSIS — J441 Chronic obstructive pulmonary disease with (acute) exacerbation: Secondary | ICD-10-CM | POA: Diagnosis not present

## 2016-08-11 DIAGNOSIS — Z6835 Body mass index (BMI) 35.0-35.9, adult: Secondary | ICD-10-CM | POA: Diagnosis not present

## 2016-08-14 ENCOUNTER — Inpatient Hospital Stay (HOSPITAL_COMMUNITY)
Admission: EM | Admit: 2016-08-14 | Discharge: 2016-08-16 | DRG: 286 | Disposition: A | Discharge status: Home / Self Care | Payer: Medicare Other | Attending: Cardiovascular Disease | Admitting: Cardiovascular Disease

## 2016-08-14 ENCOUNTER — Encounter (HOSPITAL_COMMUNITY): Payer: Self-pay

## 2016-08-14 ENCOUNTER — Emergency Department (HOSPITAL_COMMUNITY): Payer: Medicare Other

## 2016-08-14 DIAGNOSIS — I5023 Acute on chronic systolic (congestive) heart failure: Secondary | ICD-10-CM | POA: Diagnosis not present

## 2016-08-14 DIAGNOSIS — E782 Mixed hyperlipidemia: Secondary | ICD-10-CM | POA: Diagnosis present

## 2016-08-14 DIAGNOSIS — F329 Major depressive disorder, single episode, unspecified: Secondary | ICD-10-CM | POA: Diagnosis present

## 2016-08-14 DIAGNOSIS — Z951 Presence of aortocoronary bypass graft: Secondary | ICD-10-CM

## 2016-08-14 DIAGNOSIS — E119 Type 2 diabetes mellitus without complications: Secondary | ICD-10-CM | POA: Diagnosis present

## 2016-08-14 DIAGNOSIS — Z886 Allergy status to analgesic agent status: Secondary | ICD-10-CM

## 2016-08-14 DIAGNOSIS — I110 Hypertensive heart disease with heart failure: Secondary | ICD-10-CM | POA: Diagnosis present

## 2016-08-14 DIAGNOSIS — I700 Atherosclerosis of aorta: Secondary | ICD-10-CM | POA: Diagnosis present

## 2016-08-14 DIAGNOSIS — I25110 Atherosclerotic heart disease of native coronary artery with unstable angina pectoris: Principal | ICD-10-CM | POA: Diagnosis present

## 2016-08-14 DIAGNOSIS — Z90710 Acquired absence of both cervix and uterus: Secondary | ICD-10-CM | POA: Diagnosis not present

## 2016-08-14 DIAGNOSIS — K7460 Unspecified cirrhosis of liver: Secondary | ICD-10-CM | POA: Diagnosis present

## 2016-08-14 DIAGNOSIS — I5021 Acute systolic (congestive) heart failure: Secondary | ICD-10-CM

## 2016-08-14 DIAGNOSIS — I200 Unstable angina: Secondary | ICD-10-CM

## 2016-08-14 DIAGNOSIS — I2510 Atherosclerotic heart disease of native coronary artery without angina pectoris: Secondary | ICD-10-CM | POA: Diagnosis not present

## 2016-08-14 DIAGNOSIS — Z955 Presence of coronary angioplasty implant and graft: Secondary | ICD-10-CM

## 2016-08-14 DIAGNOSIS — K219 Gastro-esophageal reflux disease without esophagitis: Secondary | ICD-10-CM | POA: Diagnosis present

## 2016-08-14 DIAGNOSIS — Z87891 Personal history of nicotine dependence: Secondary | ICD-10-CM | POA: Diagnosis not present

## 2016-08-14 DIAGNOSIS — Z885 Allergy status to narcotic agent status: Secondary | ICD-10-CM | POA: Diagnosis not present

## 2016-08-14 DIAGNOSIS — Z888 Allergy status to other drugs, medicaments and biological substances status: Secondary | ICD-10-CM

## 2016-08-14 DIAGNOSIS — Z8249 Family history of ischemic heart disease and other diseases of the circulatory system: Secondary | ICD-10-CM

## 2016-08-14 DIAGNOSIS — Z7982 Long term (current) use of aspirin: Secondary | ICD-10-CM

## 2016-08-14 DIAGNOSIS — Z880 Allergy status to penicillin: Secondary | ICD-10-CM

## 2016-08-14 DIAGNOSIS — Z6835 Body mass index (BMI) 35.0-35.9, adult: Secondary | ICD-10-CM

## 2016-08-14 DIAGNOSIS — Z881 Allergy status to other antibiotic agents status: Secondary | ICD-10-CM

## 2016-08-14 DIAGNOSIS — I481 Persistent atrial fibrillation: Secondary | ICD-10-CM | POA: Diagnosis not present

## 2016-08-14 DIAGNOSIS — R079 Chest pain, unspecified: Secondary | ICD-10-CM | POA: Diagnosis not present

## 2016-08-14 DIAGNOSIS — Z79891 Long term (current) use of opiate analgesic: Secondary | ICD-10-CM

## 2016-08-14 DIAGNOSIS — Z91048 Other nonmedicinal substance allergy status: Secondary | ICD-10-CM

## 2016-08-14 DIAGNOSIS — I8500 Esophageal varices without bleeding: Secondary | ICD-10-CM | POA: Diagnosis present

## 2016-08-14 DIAGNOSIS — I4819 Other persistent atrial fibrillation: Secondary | ICD-10-CM

## 2016-08-14 DIAGNOSIS — I249 Acute ischemic heart disease, unspecified: Secondary | ICD-10-CM | POA: Diagnosis present

## 2016-08-14 DIAGNOSIS — E669 Obesity, unspecified: Secondary | ICD-10-CM | POA: Diagnosis present

## 2016-08-14 DIAGNOSIS — Z79899 Other long term (current) drug therapy: Secondary | ICD-10-CM | POA: Diagnosis not present

## 2016-08-14 HISTORY — DX: Other persistent atrial fibrillation: I48.19

## 2016-08-14 LAB — BASIC METABOLIC PANEL
Anion gap: 8 (ref 5–15)
BUN: 14 mg/dL (ref 6–20)
CO2: 28 mmol/L (ref 22–32)
Calcium: 9.2 mg/dL (ref 8.9–10.3)
Chloride: 105 mmol/L (ref 101–111)
Creatinine, Ser: 0.94 mg/dL (ref 0.44–1.00)
GFR calc Af Amer: >60 mL/min (ref >60)
GFR calc non Af Amer: >60 mL/min (ref >60)
Glucose, Bld: 95 mg/dL (ref 65–99)
Potassium: 4.6 mmol/L (ref 3.5–5.1)
Sodium: 141 mmol/L (ref 135–145)

## 2016-08-14 LAB — CBC
HCT: 37.7 % (ref 36.0–46.0)
Hemoglobin: 11 g/dL — ABNORMAL LOW (ref 12.0–15.0)
MCH: 24.3 pg — ABNORMAL LOW (ref 26.0–34.0)
MCHC: 29.2 g/dL — ABNORMAL LOW (ref 30.0–36.0)
MCV: 83.2 fL (ref 78.0–100.0)
Platelets: 73 10*3/uL — ABNORMAL LOW (ref 150–400)
RBC: 4.53 MIL/uL (ref 3.87–5.11)
RDW: 17 % — ABNORMAL HIGH (ref 11.5–15.5)
WBC: 3.7 10*3/uL — ABNORMAL LOW (ref 4.0–10.5)

## 2016-08-14 LAB — I-STAT TROPONIN, ED: Troponin i, poc: 0.01 ng/mL (ref 0.00–0.08)

## 2016-08-14 LAB — PROTIME-INR
INR: 1.29
Prothrombin Time: 16.2 seconds — ABNORMAL HIGH (ref 11.4–15.2)

## 2016-08-14 LAB — TROPONIN I: Troponin I: <0.03 ng/mL (ref <0.03)

## 2016-08-14 LAB — BRAIN NATRIURETIC PEPTIDE: B Natriuretic Peptide: 104 pg/mL — ABNORMAL HIGH (ref 0.0–100.0)

## 2016-08-14 MED ORDER — NITROGLYCERIN 0.4 MG SL SUBL: 0.4000 mg | SUBLINGUAL_TABLET | SUBLINGUAL | Status: DC | PRN

## 2016-08-14 MED ORDER — POTASSIUM CHLORIDE CRYS ER 10 MEQ PO TBCR
10.0000 meq | EXTENDED_RELEASE_TABLET | Freq: Two times a day (BID) | ORAL | Status: DC
  Administered 2016-08-14 – 2016-08-16 (×4): 10 meq via ORAL
  Filled 2016-08-14 (×4): qty 1

## 2016-08-14 MED ORDER — HEPARIN (PORCINE) IN NACL 100-0.45 UNIT/ML-% IJ SOLN
800.0000 [IU]/h | INTRAMUSCULAR | Status: DC
  Administered 2016-08-14: 800 [IU]/h via INTRAVENOUS
  Filled 2016-08-14: qty 250

## 2016-08-14 MED ORDER — ASPIRIN EC 81 MG PO TBEC
81.0000 mg | DELAYED_RELEASE_TABLET | Freq: Every day | ORAL | Status: DC
  Administered 2016-08-16: 81 mg via ORAL
  Filled 2016-08-14: qty 1

## 2016-08-14 MED ORDER — IRBESARTAN 75 MG PO TABS: 37.5000 mg | ORAL_TABLET | Freq: Every day | ORAL | Status: DC

## 2016-08-14 MED ORDER — ASPIRIN 81 MG PO CHEW
324.0000 mg | CHEWABLE_TABLET | Freq: Once | ORAL | Status: AC
  Administered 2016-08-14: 243 mg via ORAL
  Filled 2016-08-14: qty 4

## 2016-08-14 MED ORDER — HYDROCODONE-ACETAMINOPHEN 10-325 MG PO TABS
1.0000 | ORAL_TABLET | Freq: Four times a day (QID) | ORAL | Status: DC | PRN
  Administered 2016-08-15 – 2016-08-16 (×2): 1 via ORAL
  Filled 2016-08-14 (×2): qty 1

## 2016-08-14 MED ORDER — SODIUM CHLORIDE 0.9% FLUSH: 3.0000 mL | INTRAVENOUS | Status: DC | PRN

## 2016-08-14 MED ORDER — SODIUM CHLORIDE 0.9% FLUSH
3.0000 mL | Freq: Two times a day (BID) | INTRAVENOUS | Status: DC
  Administered 2016-08-15: 3 mL via INTRAVENOUS

## 2016-08-14 MED ORDER — LEVOFLOXACIN 750 MG PO TABS
750.0000 mg | ORAL_TABLET | Freq: Every day | ORAL | Status: DC
  Administered 2016-08-15 – 2016-08-16 (×2): 750 mg via ORAL
  Filled 2016-08-14 (×2): qty 1

## 2016-08-14 MED ORDER — FLUTICASONE PROPIONATE 50 MCG/ACT NA SUSP: 2.0000 | Freq: Every day | NASAL | Status: DC | PRN

## 2016-08-14 MED ORDER — ALPRAZOLAM 0.25 MG PO TABS: 0.2500 mg | ORAL_TABLET | Freq: Two times a day (BID) | ORAL | Status: DC | PRN

## 2016-08-14 MED ORDER — NITROGLYCERIN IN D5W 200-5 MCG/ML-% IV SOLN: 2.0000 ug/min | INTRAVENOUS | Status: DC

## 2016-08-14 MED ORDER — LORATADINE 10 MG PO TABS
10.0000 mg | ORAL_TABLET | Freq: Every day | ORAL | Status: DC
  Administered 2016-08-15 – 2016-08-16 (×2): 10 mg via ORAL
  Filled 2016-08-14 (×2): qty 1

## 2016-08-14 MED ORDER — ATORVASTATIN CALCIUM 20 MG PO TABS
20.0000 mg | ORAL_TABLET | Freq: Every day | ORAL | Status: DC
  Administered 2016-08-14 – 2016-08-15 (×2): 20 mg via ORAL
  Filled 2016-08-14 (×2): qty 1

## 2016-08-14 MED ORDER — SODIUM CHLORIDE 0.9 % IV SOLN: 250.0000 mL | INTRAVENOUS | Status: DC | PRN

## 2016-08-14 MED ORDER — IRBESARTAN 75 MG PO TABS
75.0000 mg | ORAL_TABLET | Freq: Every day | ORAL | Status: DC
  Administered 2016-08-15: 75 mg via ORAL
  Filled 2016-08-14 (×2): qty 1

## 2016-08-14 MED ORDER — ALBUTEROL SULFATE (2.5 MG/3ML) 0.083% IN NEBU: 2.5000 mg | INHALATION_SOLUTION | Freq: Four times a day (QID) | RESPIRATORY_TRACT | Status: DC | PRN

## 2016-08-14 MED ORDER — CARVEDILOL 6.25 MG PO TABS
6.2500 mg | ORAL_TABLET | Freq: Two times a day (BID) | ORAL | Status: DC
  Administered 2016-08-15 – 2016-08-16 (×3): 6.25 mg via ORAL
  Filled 2016-08-14 (×3): qty 1

## 2016-08-14 MED ORDER — ASPIRIN 81 MG PO CHEW
324.0000 mg | CHEWABLE_TABLET | ORAL | Status: AC
  Administered 2016-08-15: 324 mg via ORAL
  Filled 2016-08-14 (×2): qty 4

## 2016-08-14 MED ORDER — PANTOPRAZOLE SODIUM 40 MG PO TBEC
40.0000 mg | DELAYED_RELEASE_TABLET | Freq: Two times a day (BID) | ORAL | Status: DC
  Administered 2016-08-14 – 2016-08-16 (×4): 40 mg via ORAL
  Filled 2016-08-14 (×4): qty 1

## 2016-08-14 MED ORDER — FUROSEMIDE 10 MG/ML IJ SOLN
40.0000 mg | Freq: Once | INTRAMUSCULAR | Status: AC
  Administered 2016-08-14: 40 mg via INTRAVENOUS
  Filled 2016-08-14: qty 4

## 2016-08-14 MED ORDER — NITROGLYCERIN 2 % TD OINT
1.0000 [in_us] | TOPICAL_OINTMENT | Freq: Once | TRANSDERMAL | Status: AC
  Administered 2016-08-14: 1 [in_us] via TOPICAL
  Filled 2016-08-14: qty 1

## 2016-08-14 MED ORDER — ONDANSETRON HCL 4 MG/2ML IJ SOLN: 4.0000 mg | Freq: Four times a day (QID) | INTRAMUSCULAR | Status: DC | PRN

## 2016-08-14 MED ORDER — SODIUM CHLORIDE 0.9 % IV SOLN
INTRAVENOUS | Status: DC
  Administered 2016-08-15: 06:00:00 via INTRAVENOUS

## 2016-08-14 MED ORDER — ZOLPIDEM TARTRATE 5 MG PO TABS
5.0000 mg | ORAL_TABLET | Freq: Every evening | ORAL | Status: DC | PRN
  Filled 2016-08-14: qty 1

## 2016-08-14 MED ORDER — IRBESARTAN 150 MG PO TABS
300.0000 mg | ORAL_TABLET | Freq: Every day | ORAL | Status: DC
Start: 2016-08-14 — End: 2016-08-16
  Administered 2016-08-14 – 2016-08-15 (×2): 300 mg via ORAL
  Filled 2016-08-14 (×2): qty 2
  Filled 2016-08-14: qty 1

## 2016-08-14 MED ORDER — EZETIMIBE 10 MG PO TABS
10.0000 mg | ORAL_TABLET | Freq: Every day | ORAL | Status: DC
  Administered 2016-08-14 – 2016-08-15 (×2): 10 mg via ORAL
  Filled 2016-08-14 (×3): qty 1

## 2016-08-14 MED ORDER — HEPARIN BOLUS VIA INFUSION
4000.0000 [IU] | Freq: Once | INTRAVENOUS | Status: AC
  Administered 2016-08-14: 4000 [IU] via INTRAVENOUS
  Filled 2016-08-14: qty 4000

## 2016-08-14 MED ORDER — ACETAMINOPHEN 325 MG PO TABS: 650.0000 mg | ORAL_TABLET | ORAL | Status: DC | PRN

## 2016-08-14 MED ORDER — FLUOXETINE HCL 20 MG PO CAPS
40.0000 mg | ORAL_CAPSULE | Freq: Every day | ORAL | Status: DC
  Administered 2016-08-14 – 2016-08-16 (×3): 40 mg via ORAL
  Filled 2016-08-14 (×3): qty 2

## 2016-08-14 MED ORDER — CLONAZEPAM 0.5 MG PO TABS
0.5000 mg | ORAL_TABLET | Freq: Every day | ORAL | Status: DC
  Administered 2016-08-14 – 2016-08-15 (×2): 0.5 mg via ORAL
  Filled 2016-08-14 (×2): qty 1

## 2016-08-14 NOTE — ED Notes (Signed)
Dinner tray ordered; heart healthy diet 

## 2016-08-14 NOTE — ED Notes (Signed)
Attempted report x 1; name and call back number provided 

## 2016-08-14 NOTE — ED Provider Notes (Signed)
Patient care was transferred to me at end of shift from [place], pending cardiology consult and admission to cardiology vs medicine. Patient with history significant for CHF and multiple caths presenting with squeezing left sided chest pain radiating to left arm and dyspnea on exertion. Negative CXR for pulm edema. Initial Troponin and EKG negative.  Vitals:   08/14/16 1412 08/14/16 1430  BP: 129/72 127/72  Pulse: (!) 113 (!) 112  Resp: 11 15  Temp:      Plan to admit after cardiology consult. On my assessment, patient was lying comfortably in bed and reported that Dr. Gwenlyn Found from cardiology had come to talk to her and said they would admit her and send her to the cath lab in the morning.  Patient admitted to cardiology for am cath  Horton Finer, PA-C 08/14/16 1846    Milton Ferguson, MD 08/14/16 [phone]

## 2016-08-14 NOTE — ED Triage Notes (Signed)
Onset yesterday chest pain, radiates through to back and down left arm, shortness of breath, nausea.   Left hand numb.  Pt has NP cough x 3 weeks.

## 2016-08-14 NOTE — Consult Note (Signed)
Cardiology Consultation:   Patient ID: Samantha [name]; [id number]; [birth year]   Admit date: 08/14/2016 Date of Consult: 08/14/2016  Primary Care Provider: [name], MD Primary Cardiologist: Dr Burt Knack 06/13/2016 Primary Electrophysiologist:  n/a   Patient Profile:   Samantha [name] is a 66 y.o. female with a hx of CABG 2001 w/ LIMA-OM, SVG-D1, SVG-RCA), S-CHF, PCI CFX, cath 2016 w/ med rx, HTN, HLD, DM, obesity, esophageal varices requiring banding, non-ETOH cirrhosis, atrial flutter dx 05/2016 w/ CHADS2VASC=5 (female, age x 1, CHF, HTN, CAD) but not anticoag candidate 2nd varices/cirrhosis, who is being seen today for the evaluation of chest pain and SOB at the request of N. Pisciotta, [place].  History of Present Illness:   Samantha [name] was in her usual state of health until about a month ago. Samantha [name] developed a cough, but no other sx at first. Then Samantha [name] got increasing DOE, could not walk room-room. Samantha [name] also got LE edema. The edema improved with increased Lasix dosing. Samantha [name] continued to cough, the cough would not allow her to sleep.  Samantha [name] got chest pressure, squeezing, and it went through to her back. It was associated with L arm numbness and pain. The SOB was a little worse. When Samantha [name] was not able to sleep for the last 4 nights, because of the coughing and SOB, Samantha [name] decided to come to the ER. In the ER, Samantha [name] was given SL NTG with improvement in her symptoms. Her chest pain is currently 6/10, the arm pain is a 7/10. There is some L arm numbness as well.   Although her edema has improved, Samantha [name] still has some.   Samantha [name] is aware that Samantha [name] should not be on blood thinners long term.   Past Medical History:  Diagnosis Date  . CHF (congestive heart failure) ([place])   . Cirrhosis of liver ([place])   . Coronary artery disease    Status post percutaneous coronary intervention and bare metal stenting  of the left circumflex and subsequent percutaneous transluminal coronary anioplasty and placement of a 3.0 NIR bare  metal stent proximal to the previous stent.  . Depression   . Esophageal varices ([place])    New 2013  . Gastroesophageal reflux disease   . H/O: hysterectomy   . History of pneumonia   . Hyperlipidemia   . Hypertension   . Obesity   . Osteoarthritis   . Rapid palpitations   . Status post hysterectomy   . Thrombocytopenia ([place])     Past Surgical History:  Procedure Laterality Date  . ABDOMINAL HYSTERECTOMY    . BACK SURGERY    . CARDIAC CATHETERIZATION  2004   left internal mammary artery to the obtuse marginal  was found to be small and thread like.  The two grafts were patent.  The left circumflex had 90% in-stent restenosis and cutting balloon angioplasty was performed followed by placement of a 3.0 x 51m Taxus drug -eluting stent.    .Marland KitchenCARDIAC CATHETERIZATION  2006   There was in-stent restenosis in the left circumflex and this was treated with cutting balloon angioplasty   . CARDIAC CATHETERIZATION  2008   vein graft to the to the obtuse marginal was patent, although small, left circumflex had 40% in-stent restenosis, ejection fraction 40-45%.  The patient was medically mananged.  .Marland KitchenCARDIAC CATHETERIZATION N/A 01/09/2015   Procedure: Left Heart Cath and Cors/Grafts Angiography;  Surgeon: HBelva Crome MD;  [facility];  Service: Cardiovascular;  Laterality: N/A;  .  COLONOSCOPY  03/29/2011   Procedure: COLONOSCOPY;  Surgeon: Jamesetta So, MD;  [facility];  Service: Gastroenterology;  Laterality: N/A;  . CORONARY ARTERY BYPASS GRAFT  May 31,2001   x 3 with a vein graft to the first diagonal, vein graft to the right coronary  artery, and a free left internal mammary  artery to the obtuse marginal   . ESOPHAGEAL BANDING N/A 07/04/2012   Procedure: ESOPHAGEAL BANDING;  Surgeon: Rogene Houston, MD;  [facility];  Service: Endoscopy;  Laterality: N/A;  . ESOPHAGEAL BANDING N/A 09/17/2012   Procedure: ESOPHAGEAL BANDING;  Surgeon: Rogene Houston, MD;  [facility];  Service: Endoscopy;  Laterality: N/A;  . ESOPHAGEAL BANDING N/A 10/22/2013   Procedure: ESOPHAGEAL BANDING;  Surgeon: Rogene Houston, MD;  [facility];  Service: Endoscopy;  Laterality: N/A;  . ESOPHAGEAL BANDING N/A 11/28/2014   Procedure: ESOPHAGEAL BANDING;  Surgeon: Rogene Houston, MD;  [facility];  Service: Endoscopy;  Laterality: N/A;  . ESOPHAGEAL BANDING N/A 10/29/2015   Procedure: ESOPHAGEAL BANDING;  Surgeon: Rogene Houston, MD;  [facility];  Service: Endoscopy;  Laterality: N/A;  . ESOPHAGOGASTRODUODENOSCOPY  12/20/2011   Procedure: ESOPHAGOGASTRODUODENOSCOPY (EGD);  Surgeon: Jamesetta So, MD;  [facility];  Service: Gastroenterology;  Laterality: N/A;  . ESOPHAGOGASTRODUODENOSCOPY N/A 07/04/2012   Procedure: ESOPHAGOGASTRODUODENOSCOPY (EGD);  Surgeon: Rogene Houston, MD;  [facility];  Service: Endoscopy;  Laterality: N/A;  235-moved to 255 Ann to notify Samantha [name]  . ESOPHAGOGASTRODUODENOSCOPY N/A 09/17/2012   Procedure: ESOPHAGOGASTRODUODENOSCOPY (EGD);  Surgeon: Rogene Houston, MD;  [facility];  Service: Endoscopy;  Laterality: N/A;  730  . ESOPHAGOGASTRODUODENOSCOPY N/A 10/22/2013   Procedure: ESOPHAGOGASTRODUODENOSCOPY (EGD);  Surgeon: Rogene Houston, MD;  [facility];  Service: Endoscopy;  Laterality: N/A;  730  . ESOPHAGOGASTRODUODENOSCOPY N/A 11/28/2014   Procedure: ESOPHAGOGASTRODUODENOSCOPY (EGD);  Surgeon: Rogene Houston, MD;  [facility];  Service: Endoscopy;  Laterality: N/A;  1:25  . ESOPHAGOGASTRODUODENOSCOPY N/A 10/29/2015   Procedure: ESOPHAGOGASTRODUODENOSCOPY (EGD);  Surgeon: Rogene Houston, MD;  [facility];  Service: Endoscopy;  Laterality: N/A;  12:00  . JOINT REPLACEMENT    . LEFT HEART CATHETERIZATION WITH CORONARY/GRAFT ANGIOGRAM N/A 12/25/2013   Procedure: LEFT HEART CATHETERIZATION WITH Beatrix Fetters;   Surgeon: Blane Ohara, MD;  Location[facility];  Service: Cardiovascular;  Laterality: N/A;  . rotator cuff left  2007  . TONSILLECTOMY      Allergies:    Allergies  Allergen Reactions  . Acetaminophen Other (See Comments)    Liver problems  . Oxycodone Itching and Nausea Only  . Ace Inhibitors Other (See Comments)  . Cefaclor Itching and Rash  . Cephalexin Itching and Rash  . Penicillins Rash  . Pregabalin Other (See Comments)    Retains fluid  . Tape Itching and Rash    Takes skin right off with medical tape   Prior to Admission medications   Medication Sig  albuterol (PROVENTIL HFA;VENTOLIN HFA) 108 (90 BASE) MCG/ACT inhaler Inhale 1-2 puffs into the lungs every 6 (six) hours as needed for wheezing or shortness of breath.  aspirin (ASPIRIN EC) 81 MG EC tablet Take 81 mg by mouth daily. Swallow whole.  atorvastatin (LIPITOR) 20 MG tablet Take 20 mg by mouth daily at 6 PM.   carvedilol (COREG) 6.25 MG tablet Take 1 tablet (6.25 mg  total) by mouth 2 (two) times daily with a meal.  cetirizine (ZYRTEC) 10 MG tablet Take 10 mg by mouth daily as needed for allergies.   clonazePAM (KLONOPIN) 0.5 MG tablet Take 0.5 mg by mouth at bedtime.   ezetimibe (ZETIA) 10 MG tablet Take 10 mg by mouth at bedtime.   FLUoxetine (PROZAC) 40 MG capsule Take 40 mg by mouth daily.  furosemide (LASIX) 40 MG tablet TAKE 2 TABLETS BY MOUTH ONCE DAILY TAKE  1  EXTRA  TABLET  DAILY  FOR  WEIGHT  GAIN  OF  2  POUNDS  OVERNIGHT  HYDROcodone-acetaminophen (NORCO) 10-325 MG tablet Take 1 tablet by mouth every 6 (six) hours as needed (for cough also).  isosorbide mononitrate (IMDUR) 60 MG 24 hr tablet Take 1 tablet (60 mg total) by mouth daily.  NITROSTAT 0.4 MG SL tablet Place 0.4 mg under the tongue every 5 (five) minutes as needed for chest pain (x 3 tabs daily).   pantoprazole (PROTONIX) 40 MG tablet Take 1 tablet (40 mg total) by mouth 2 (two) times daily.  potassium chloride (K-DUR) 10 MEQ tablet Take  1 tablet (10 mEq total) by mouth daily.  valsartan (DIOVAN) 160 MG tablet Take 80-160 mg by mouth 2 (two) times daily. Patient states that Samantha [name] takes 160 mg in the morning , and 80 mg at night.     Social History:   Social History   Social History  . Marital status: Divorced    Spouse name: N/A  . Number of children: N/A  . Years of education: N/A   Occupational History  . Disabled    Social History Main Topics  . Smoking status: Former Smoker    Packs/day: 0.50    Years: 20.00    Types: Cigarettes    Quit date: 07/21/1995  . Smokeless tobacco: Never Used  . Alcohol use No  . Drug use: No  . Sexual activity: Not on file   Other Topics Concern  . Not on file   Social History Narrative   Lives in [place] by herself.      Family History:   The patient's family history includes Coronary artery disease in her sister; Heart attack in her mother; Heart attack (age of onset: 34) in her father. There is no history of Colon cancer.  ROS:  Please see the history of present illness.  Review of Systems  Constitution: Negative.  HENT: Negative.   Eyes: Negative.   Cardiovascular: Positive for chest pain, dyspnea on exertion and leg swelling.  Respiratory: Positive for cough, shortness of breath and sleep disturbances due to breathing.   Endocrine: Negative.   Hematologic/Lymphatic: Negative.   Skin: Negative.   Musculoskeletal: Negative.   Gastrointestinal: Negative.   Genitourinary: Negative.   Neurological: Negative.   Psychiatric/Behavioral: Negative.   Allergic/Immunologic: Negative.     All other ROS reviewed and negative.     Physical Exam/Data:   Vitals:   08/14/16 1217 08/14/16 1411 08/14/16 1412  BP: 125/77 129/72 129/72  Pulse: (!) 113  (!) 113  Resp: 16  11  Temp: 98.2 F (36.8 C)    SpO2: 100%  98%   No intake or output data in the 24 hours ending 08/14/16 1447 There were no vitals filed for this visit. There is no height or weight on file to calculate  BMI.  General:  Well nourished, well developed, in no acute distress HEENT: normal Lymph: no adenopathy Neck: JVD 9 cm, +HJR Endocrine:  No thryomegaly  Vascular: No carotid bruits; FA pulses 2+ bilaterally without bruits  Cardiac:  normal S1, S2; RRR; no murmur  Lungs:  clear to auscultation bilaterally, no wheezing, rhonchi, few scattered rales  Abd: soft, nontender, no hepatomegaly  Ext: trace-1+ edema Musculoskeletal:  No deformities, BUE and BLE strength normal and equal Skin: warm and dry  Neuro:  CNs 2-12 intact, no focal abnormalities noted Psych:  Normal affect    EKG:  The EKG was personally reviewed and demonstrates SR, R axis, inferior ST/T wave changes from 05/2016, lateral leads look about the same  Relevant CV Studies: Cardiac Cath: 01/09/2015         The left ventricular ejection fraction is 35-45% by visual estimate    Laboratory Data:  Chemistry Recent Labs Lab 08/14/16 1215  NA 141  K 4.6  CL 105  CO2 28  GLUCOSE 95  BUN 14  CREATININE 0.94  CALCIUM 9.2  GFRNONAA >60  GFRAA >60  ANIONGAP 8    No results for input(s): PROT, ALBUMIN, AST, ALT, ALKPHOS, BILITOT in the last 168 hours. Hematology Recent Labs Lab 08/14/16 1215  WBC 3.7*  RBC 4.53  HGB 11.0*  HCT 37.7  MCV 83.2  MCH 24.3*  MCHC 29.2*  RDW 17.0*  PLT 73*   Cardiac EnzymesNo results for input(s): TROPONINI in the last 168 hours.  Recent Labs Lab 08/14/16 1421  TROPIPOC 0.01   BNP    Component Value Date/Time   BNP 104.0 (H) 08/14/2016 1414   Radiology/Studies:  Dg Chest 2 View Result Date: 08/14/2016 CLINICAL DATA:  Chest pain. EXAM: CHEST  2 VIEW COMPARISON:  07/26/2016 FINDINGS: Previous median sternotomy CABG procedure. Aortic atherosclerosis. Cardiac enlargement. No pleural effusion or edema. No airspace consolidation. Scar like opacities in both lungs appear similar to previous exam. IMPRESSION: 1. No acute cardiopulmonary abnormalities. 2.  Aortic  Atherosclerosis (ICD10-I70.0). Electronically Signed   By: Kerby Moors M.D.   On: 08/14/2016 13:14    Assessment and Plan:   1. Cough and DOE:  - The patient describes these symptoms as pre-PCI symptoms in the past.  - Samantha [name] also has some edema and volume overload by exam. - no explanation for the SOB/cough by CXR or BNP - with sx volume overload, will diurese w/ Lasix 40 mg x 1 - will also rx w/ nitrates and heparin  2. Chest pain - has not had in the past when CABG/PCI needed - however, ECG is slightly different and sx improved w/ nitrates - treat w/ nitrates, heparin, morphine.  - cath in am, Dr Gwenlyn Found reviewed the risk/benefits and Samantha [name] agrees  3. Atrial fib: - Samantha [name] was in atrial flutter when seen in April 2018 - rate was controlled - despite elevated CHADS2VASC, Samantha [name] not long-term anticoag candidate   due to hx varices and cirrhosis - LFTs have been ok recently, think Samantha [name] can tolerate heparin for now. - watch on telemetry, HR is ok now - discuss w/ Dr Burt Knack if Samantha [name] might be able to be anticoagulated short-term,  and on ASA/Plavix longer term, in order to get a WATCHMAN device.    Augusto Garbe  08/14/2016 2:47 PM   Agree with note by Rosaria Ferries PA-C  Samantha [name] with known CAD s/p remote CABG X 2 2001, cath 2016 by Dr Tamala Julian showed patent grafts with EF 35%. Samantha [name] has been in AF at least since April when Samantha [name] saw Dr Burt Knack. No AC secondary to cirrhosis and esophageal varices. Other probs as noted.  Samantha [name] has had increased SOB/DOE with BLE edema for several week as well as nitrate responsive CP last few days. Samantha [name] has taken extra lasix which has improved the edema. On exam her JVP is increased . Cor irreg irreg. Mild edema. EKG AF with CVR, inferolat TWI. WIll begin IV hep, long acting oral nitrate, cycle enz.  Plan cor angio tomorrow.  Lorretta Harp, M.D., [place], [facility], Samantha [name] [address] [place], [place]   [address]  [phone] 08/14/2016 4:45 PM

## 2016-08-14 NOTE — ED Provider Notes (Signed)
[place] DEPT Provider Note   CSN: [id number] Arrival date & time: 08/14/16  1208     History   Chief Complaint Chief Complaint  Patient presents with  . Chest Pain    HPI  Blood pressure 125/77, pulse (!) 113, temperature 98.2 F (36.8 C), resp. rate 16, last menstrual period 03/29/2011, SpO2 100 %.  Samantha [name] is a 66 y.o. female with past medical history significant for CAD (CABG and multiple cardiac cath, last in 2016 with nonobstructive disease), CHF, stage IV nonalcoholic cirrhosis complaining of shortness of breath, dyspnea on exertion, orthopnea (3 pillows) PND worsening over the course of last 3 weeks. She states that she's been on multiple courses of antibiotics with little relief. She denies fever, chills, productive cough but endorses a dry cough. She states that her peripheral edema is not significantly worse than normal. Client with her Lasix and check her weights daily. She started having a left-sided squeezing pressure-like chest pain yesterday it was alleviated by nitroglycerin, persisted today and she feels an aching in her left arm with some numbness in the left fingers. This is nonexertional but her shortness of breath is so severe she can't really walk more than a few steps at a time. She is not anticoagulated (8 after these liver failure progressed) she does take a low-dose daily aspirin and she took 2 extra which she chewed last night at 2 AM.  Cards: Burt Knack    Past Medical History:  Diagnosis Date  . CHF (congestive heart failure) ([place])   . Cirrhosis of liver ([place])   . Coronary artery disease    Status post percutaneous coronary intervention and bare metal stenting  of the left circumflex and subsequent percutaneous transluminal coronary anioplasty and placement of a 3.0 NIR bare metal stent proximal to the previous stent.  . Depression   . Esophageal varices ([place])    New 2013  . Gastroesophageal reflux disease   . H/O: hysterectomy   . History of  pneumonia   . Hyperlipidemia   . Hypertension   . Obesity   . Osteoarthritis   . Rapid palpitations   . Status post hysterectomy   . Thrombocytopenia [facility])     Patient Active Problem List   Diagnosis Date Noted  . Cardiomyopathy, ischemic-35-40% by cath  01/11/2015  . Acute on chronic combined systolic and diastolic heart failure ([place]) 01/11/2015  . Unstable angina- secondary to CHF, Troponin negative 01/08/2015  . Obesity 09/24/2013  . Unspecified hereditary and idiopathic peripheral neuropathy 01/10/2013  . Cirrhosis, non-alcoholic ([place]) 23/53/6144  . Chronic combined systolic and diastolic CHF, NYHA class 2 ([place]) 01/16/2012  . CHF (congestive heart failure) ([place]) 12/23/2011  . Esophageal varices- Plavix stopped 12/23/2011  . GI bleed 12/23/2011  . Anemia 04/22/2011  . CAD S/P prior CFX PCI with DES 08/21/2009  . Mixed hyperlipidemia 06/04/2008  . Essential hypertension 06/04/2008  . S/P CABG x 3- 2001 06/04/2008    Past Surgical History:  Procedure Laterality Date  . ABDOMINAL HYSTERECTOMY    . BACK SURGERY    . CARDIAC CATHETERIZATION  2004   left internal mammary artery to the obtuse marginal  was found to be small and thread like.  The two grafts were patent.  The left circumflex had 90% in-stent restenosis and cutting balloon angioplasty was performed followed by placement of a 3.0 x 27mm Taxus drug -eluting stent.    [name] CARDIAC CATHETERIZATION  2006   There was in-stent restenosis in the left circumflex  and this was treated with cutting balloon angioplasty   . CARDIAC CATHETERIZATION  2008   vein graft to the to the obtuse marginal was patent, although small, left circumflex had 40% in-stent restenosis, ejection fraction 40-45%.  The patient was medically mananged.  [name] CARDIAC CATHETERIZATION N/A 01/09/2015   Procedure: Left Heart Cath and Cors/Grafts Angiography;  Surgeon: Belva Crome, MD;  Location: [place] CV LAB;  Service: Cardiovascular;  Laterality: N/A;  .  COLONOSCOPY  03/29/2011   Procedure: COLONOSCOPY;  Surgeon: Jamesetta So, MD;  [facility];  Service: Gastroenterology;  Laterality: N/A;  . CORONARY ARTERY BYPASS GRAFT  May 31,2001   x 3 with a vein graft to the first diagonal, vein graft to the right coronary  artery, and a free left internal mammary  artery to the obtuse marginal   . ESOPHAGEAL BANDING N/A 07/04/2012   Procedure: ESOPHAGEAL BANDING;  Surgeon: Rogene Houston, MD;  [facility];  Service: Endoscopy;  Laterality: N/A;  . ESOPHAGEAL BANDING N/A 09/17/2012   Procedure: ESOPHAGEAL BANDING;  Surgeon: Rogene Houston, MD;  [facility];  Service: Endoscopy;  Laterality: N/A;  . ESOPHAGEAL BANDING N/A 10/22/2013   Procedure: ESOPHAGEAL BANDING;  Surgeon: Rogene Houston, MD;  [facility];  Service: Endoscopy;  Laterality: N/A;  . ESOPHAGEAL BANDING N/A 11/28/2014   Procedure: ESOPHAGEAL BANDING;  Surgeon: Rogene Houston, MD;  [facility];  Service: Endoscopy;  Laterality: N/A;  . ESOPHAGEAL BANDING N/A 10/29/2015   Procedure: ESOPHAGEAL BANDING;  Surgeon: Rogene Houston, MD;  [facility];  Service: Endoscopy;  Laterality: N/A;  . ESOPHAGOGASTRODUODENOSCOPY  12/20/2011   Procedure: ESOPHAGOGASTRODUODENOSCOPY (EGD);  Surgeon: Jamesetta So, MD;  [facility];  Service: Gastroenterology;  Laterality: N/A;  . ESOPHAGOGASTRODUODENOSCOPY N/A 07/04/2012   Procedure: ESOPHAGOGASTRODUODENOSCOPY (EGD);  Surgeon: Rogene Houston, MD;  [facility];  Service: Endoscopy;  Laterality: N/A;  235-moved to 255 Ann to notify pt  . ESOPHAGOGASTRODUODENOSCOPY N/A 09/17/2012   Procedure: ESOPHAGOGASTRODUODENOSCOPY (EGD);  Surgeon: Rogene Houston, MD;  [facility];  Service: Endoscopy;  Laterality: N/A;  730  . ESOPHAGOGASTRODUODENOSCOPY N/A 10/22/2013   Procedure: ESOPHAGOGASTRODUODENOSCOPY (EGD);  Surgeon: Rogene Houston, MD;  [facility];   Service: Endoscopy;  Laterality: N/A;  730  . ESOPHAGOGASTRODUODENOSCOPY N/A 11/28/2014   Procedure: ESOPHAGOGASTRODUODENOSCOPY (EGD);  Surgeon: Rogene Houston, MD;  [facility];  Service: Endoscopy;  Laterality: N/A;  1:25  . ESOPHAGOGASTRODUODENOSCOPY N/A 10/29/2015   Procedure: ESOPHAGOGASTRODUODENOSCOPY (EGD);  Surgeon: Rogene Houston, MD;  [facility];  Service: Endoscopy;  Laterality: N/A;  12:00  . JOINT REPLACEMENT    . LEFT HEART CATHETERIZATION WITH CORONARY/GRAFT ANGIOGRAM N/A 12/25/2013   Procedure: LEFT HEART CATHETERIZATION WITH Beatrix Fetters;  Surgeon: Blane Ohara, MD;  Location: [facility] CATH LAB;  Service: Cardiovascular;  Laterality: N/A;  . rotator cuff left  2007  . TONSILLECTOMY      OB History    No data available       Home Medications    Prior to Admission medications   Medication Sig Start Date End Date Taking? Authorizing Provider  albuterol (PROVENTIL HFA;VENTOLIN HFA) 108 (90 BASE) MCG/ACT inhaler Inhale 1-2 puffs into the lungs every 6 (six) hours as needed for wheezing or shortness of breath.    [provider]  aspirin (ASPIRIN EC) 81 MG EC tablet Take 81  mg by mouth daily. Swallow whole.    [provider]  atorvastatin (LIPITOR) 20 MG tablet Take 20 mg by mouth daily at 6 PM.     [provider]  carvedilol (COREG) 6.25 MG tablet Take 1 tablet (6.25 mg total) by mouth 2 (two) times daily with a meal. 06/13/16   Sherren Mocha, MD  cetirizine (ZYRTEC) 10 MG tablet Take 10 mg by mouth daily as needed for allergies.     [provider]  clonazePAM (KLONOPIN) 0.5 MG tablet Take 0.5 mg by mouth at bedtime.     [provider]  ezetimibe (ZETIA) 10 MG tablet Take 10 mg by mouth at bedtime.     [provider]  FLUoxetine (PROZAC) 40 MG capsule Take 40 mg by mouth daily.    [provider]  furosemide (LASIX) 40 MG tablet TAKE 2 TABLETS BY MOUTH ONCE DAILY TAKE  1   EXTRA  TABLET  DAILY  FOR  WEIGHT  GAIN  OF  2  POUNDS  OVERNIGHT 08/02/16   Sherren Mocha, MD  HYDROcodone-acetaminophen [facility]) 10-325 MG tablet Take 1 tablet by mouth every 6 (six) hours as needed (for cough also).    [provider]  isosorbide mononitrate (IMDUR) 60 MG 24 hr tablet Take 1 tablet (60 mg total) by mouth daily. 09/21/15 12/20/15  Sherren Mocha, MD  NITROSTAT 0.4 MG SL tablet Place 0.4 mg under the tongue every 5 (five) minutes as needed for chest pain (x 3 tabs daily).  11/15/13   [provider]  pantoprazole (PROTONIX) 40 MG tablet Take 1 tablet (40 mg total) by mouth 2 (two) times daily. 02/26/15   Sherren Mocha, MD  potassium chloride (K-DUR) 10 MEQ tablet Take 1 tablet (10 mEq total) by mouth daily. 01/29/15   Lyda Jester M, PA-C  valsartan (DIOVAN) 160 MG tablet Take 80-160 mg by mouth 2 (two) times daily. Patient states that she takes 160 mg in the morning , and 80 mg at night.    [provider]    Family History Family History  Problem Relation Age of Onset  . Heart attack Mother   . Heart attack Father 23       cause of death  . Coronary artery disease Sister        CABG   . Colon cancer Neg Hx     Social History Social History  Substance Use Topics  . Smoking status: Former Smoker    Packs/day: 0.50    Years: 20.00    Types: Cigarettes    Quit date: 07/21/1995  . Smokeless tobacco: Never Used  . Alcohol use No     Allergies   Acetaminophen; Oxycodone; Ace inhibitors; Cefaclor; Cephalexin; Penicillins; Pregabalin; and Tape   Review of Systems Review of Systems   Physical Exam Updated Vital Signs BP 122/73   Pulse 98   Temp 98.2 F (36.8 C)   Resp (!) 26   LMP 03/29/2011   SpO2 97%   Physical Exam  Constitutional: She is oriented to person, place, and time. She appears well-developed and well-nourished. No distress.  HENT:  Head: Normocephalic.  Mouth/Throat: Oropharynx is clear and moist.  Eyes:  Conjunctivae are normal.  Neck: Normal range of motion. No JVD present. No tracheal deviation present.  Cardiovascular: Normal rate, regular rhythm and intact distal pulses.   Midline sternotomy  Radial pulse equal bilaterally  Pulmonary/Chest: Effort normal and breath sounds normal. No stridor. No respiratory distress. She has  no wheezes. She has no rales. She exhibits no tenderness.  Abdominal: Soft. She exhibits no distension and no mass. There is no tenderness. There is no rebound and no guarding.  Musculoskeletal: Normal range of motion. She exhibits no edema or tenderness.  No calf asymmetry, superficial collaterals, palpable cords, edema, Homans sign negative bilaterally.    Neurological: She is alert and oriented to person, place, and time.  Skin: Skin is warm. She is not diaphoretic.  Vein harvesting scar to right lower extremity  Psychiatric: She has a normal mood and affect.  Nursing note and vitals reviewed.    ED Treatments / Results  Labs (all labs ordered are listed, but only abnormal results are displayed) Labs Reviewed  CBC - Abnormal; Notable for the following:       Result Value   WBC 3.7 (*)    Hemoglobin 11.0 (*)    MCH 24.3 (*)    MCHC 29.2 (*)    RDW 17.0 (*)    Platelets 73 (*)    All other components within normal limits  BRAIN NATRIURETIC PEPTIDE - Abnormal; Notable for the following:    B Natriuretic Peptide 104.0 (*)    All other components within normal limits  BASIC METABOLIC PANEL  I-STAT TROPOININ, ED    EKG  EKG Interpretation  Date/Time:  Sunday August 14 2016 12:14:04 EDT Ventricular Rate:  95 PR Interval:    QRS Duration: 110 QT Interval:  378 QTC Calculation: 475 R Axis:   98 Text Interpretation:  Atrial fibrillation Rightward axis ST & T wave abnormality, consider inferior ischemia Abnormal ECG Confirmed by Malvin Johns [phone]) on 08/14/2016 2:48:05 PM       Radiology Dg Chest 2 View  Result Date: 08/14/2016 CLINICAL DATA:   Chest pain. EXAM: CHEST  2 VIEW COMPARISON:  07/26/2016 FINDINGS: Previous median sternotomy CABG procedure. Aortic atherosclerosis. Cardiac enlargement. No pleural effusion or edema. No airspace consolidation. Scar like opacities in both lungs appear similar to previous exam. IMPRESSION: 1. No acute cardiopulmonary abnormalities. 2.  Aortic Atherosclerosis (ICD10-I70.0). Electronically Signed   By: Kerby Moors M.D.   On: 08/14/2016 13:14    Procedures Procedures (including critical care time)  Medications Ordered in ED Medications  aspirin chewable tablet 324 mg (243 mg Oral Given 08/14/16 1414)  nitroGLYCERIN (NITROGLYN) 2 % ointment 1 inch (1 inch Topical Given 08/14/16 1415)     Initial Impression / Assessment and Plan / ED Course  I have reviewed the triage vital signs and the nursing notes.  Pertinent labs & imaging results that were available during my care of the patient were reviewed by me and considered in my medical decision making (see chart for details).     Vitals:   08/14/16 1411 08/14/16 1412 08/14/16 1430 08/14/16 1500  BP: 129/72 129/72 127/72 122/73  Pulse:  (!) 113 (!) 112 98  Resp:  11 15 (!) 26  Temp:      SpO2:  98% 97% 97%    Medications  aspirin chewable tablet 324 mg (243 mg Oral Given 08/14/16 1414)  nitroGLYCERIN (NITROGLYN) 2 % ointment 1 inch (1 inch Topical Given 08/14/16 1415)    Samantha [name] is 66 y.o. female presenting with Chest pain onset yesterday it is left-sided, pressure-like in nature and radiating to the left arm described as aching with a numbness in the fingertips. This is relieved by nitroglycerin. Patient chewed 2 aspirin at 2 AM. This is nonexertional in nature but she is not  able to exert herself because of severe dyspnea on exertion she states she can only take a few steps. She does not report worsening peripheral edema however she does endorse orthopnea, PND and dry cough. Her lung sounds are clear with excellent air movement in the  chest x-ray does not show any florid fluid overload. Case discussed with Suanne Marker from cardiology they will evaluate her and make disposition recommendations, question primary cardiology admission versus medical admission.  Case signed out to PA Odin at shift change.   Final Clinical Impressions(s) / ED Diagnoses   Final diagnoses:  None    New Prescriptions New Prescriptions   No medications on file     Waynetta Pean 08/14/16 1520    Malvin Johns, MD 08/14/16 1525

## 2016-08-14 NOTE — Progress Notes (Signed)
ANTICOAGULATION CONSULT NOTE - Initial Consult  Pharmacy Consult for heparin Indication: chest pain/ACS  Allergies  Allergen Reactions  . Acetaminophen Other (See Comments)    Liver problems  . Oxycodone Itching and Nausea Only  . Ace Inhibitors Other (See Comments)  . Cefaclor Itching and Rash  . Cephalexin Itching and Rash  . Penicillins Rash    Has patient had a PCN reaction causing immediate rash, facial/tongue/throat swelling, SOB or lightheadedness with hypotension: YES Has patient had a PCN reaction causing severe rash involving mucus membranes or skin necrosis: NO Has patient had a PCN reaction that required hospitalization: YES Has patient had a PCN reaction occurring within the last 10 years: NO If all of the above answers are "NO", then may proceed with Cephalosporin use.   . Pregabalin Other (See Comments)    Retains fluid  . Tape Itching and Rash    Takes skin right off with medical tape    Patient Measurements: Weight: 193 lb (87.5 kg) Heparin Dosing Weight: 70 kg  Vital Signs: Temp: 98.2 F (36.8 C) (06/17 1217) BP: 103/46 (06/17 2000) Pulse Rate: 70 (06/17 2000)  Labs:  Recent Labs  08/14/16 1215 08/14/16 1719  HGB 11.0*  --   HCT 37.7  --   PLT 73*  --   LABPROT  --  16.2*  INR  --  1.29  CREATININE 0.94  --     Estimated Creatinine Clearance: 59.7 mL/min (by C-G formula based on SCr of 0.94 mg/dL).  Assessment: 66 yo f presenting with CP  Anticoag: hep gtt for ACS.   Renal: SCr 0.94  Heme/Onc: H&H 11/37.7, Plt 73  Goal of Therapy:  Heparin level 0.3-0.7 units/ml Monitor platelets by anticoagulation protocol: Yes   Plan:  Heparin 4000 unit bolus x 1 Heparin infusion 800 units/hr Daily HL, CBC F/U Cards plans  Levester Fresh, PharmD, BCPS, BCCCP Clinical Pharmacist 08/14/2016 8:44 PM

## 2016-08-15 ENCOUNTER — Other Ambulatory Visit: Payer: Self-pay

## 2016-08-15 ENCOUNTER — Encounter (HOSPITAL_COMMUNITY)
Admission: EM | Disposition: A | Discharge status: Home / Self Care | Payer: Self-pay | Source: Home / Self Care | Attending: Cardiovascular Disease

## 2016-08-15 DIAGNOSIS — K7460 Unspecified cirrhosis of liver: Secondary | ICD-10-CM

## 2016-08-15 DIAGNOSIS — I25110 Atherosclerotic heart disease of native coronary artery with unstable angina pectoris: Principal | ICD-10-CM

## 2016-08-15 DIAGNOSIS — I249 Acute ischemic heart disease, unspecified: Secondary | ICD-10-CM

## 2016-08-15 DIAGNOSIS — I200 Unstable angina: Secondary | ICD-10-CM

## 2016-08-15 HISTORY — PX: LEFT HEART CATH AND CORS/GRAFTS ANGIOGRAPHY: CATH118250

## 2016-08-15 LAB — COMPREHENSIVE METABOLIC PANEL
ALT: 20 U/L (ref 14–54)
AST: 36 U/L (ref 15–41)
Albumin: 3.7 g/dL (ref 3.5–5.0)
Alkaline Phosphatase: 67 U/L (ref 38–126)
Anion gap: 8 (ref 5–15)
BUN: 14 mg/dL (ref 6–20)
CO2: 28 mmol/L (ref 22–32)
Calcium: 8.9 mg/dL (ref 8.9–10.3)
Chloride: 103 mmol/L (ref 101–111)
Creatinine, Ser: 0.86 mg/dL (ref 0.44–1.00)
GFR calc Af Amer: >60 mL/min (ref >60)
GFR calc non Af Amer: >60 mL/min (ref >60)
Glucose, Bld: 111 mg/dL — ABNORMAL HIGH (ref 65–99)
Potassium: 3.5 mmol/L (ref 3.5–5.1)
Sodium: 139 mmol/L (ref 135–145)
Total Bilirubin: 0.6 mg/dL (ref 0.3–1.2)
Total Protein: 6.5 g/dL (ref 6.5–8.1)

## 2016-08-15 LAB — LIPID PANEL
Cholesterol: 143 mg/dL (ref 0–200)
HDL: 65 mg/dL (ref >40)
LDL Cholesterol: 68 mg/dL (ref 0–99)
Total CHOL/HDL Ratio: 2.2 RATIO
Triglycerides: 50 mg/dL (ref <150)
VLDL: 10 mg/dL (ref 0–40)

## 2016-08-15 LAB — CBC
HCT: 35 % — ABNORMAL LOW (ref 36.0–46.0)
Hemoglobin: 10.6 g/dL — ABNORMAL LOW (ref 12.0–15.0)
MCH: 24.8 pg — ABNORMAL LOW (ref 26.0–34.0)
MCHC: 30.3 g/dL (ref 30.0–36.0)
MCV: 81.8 fL (ref 78.0–100.0)
Platelets: 61 10*3/uL — ABNORMAL LOW (ref 150–400)
RBC: 4.28 MIL/uL (ref 3.87–5.11)
RDW: 17.2 % — ABNORMAL HIGH (ref 11.5–15.5)
WBC: 3.1 10*3/uL — ABNORMAL LOW (ref 4.0–10.5)

## 2016-08-15 LAB — TROPONIN I
Troponin I: <0.03 ng/mL (ref <0.03)
Troponin I: <0.03 ng/mL (ref <0.03)

## 2016-08-15 LAB — HEPARIN LEVEL (UNFRACTIONATED)
Heparin Unfractionated: 0.42 IU/mL (ref 0.30–0.70)
Heparin Unfractionated: 0.41 IU/mL (ref 0.30–0.70)

## 2016-08-15 LAB — MRSA PCR SCREENING: MRSA by PCR: NEGATIVE

## 2016-08-15 SURGERY — LEFT HEART CATH AND CORS/GRAFTS ANGIOGRAPHY
Anesthesia: LOCAL

## 2016-08-15 MED ORDER — MIDAZOLAM HCL 2 MG/2ML IJ SOLN
INTRAMUSCULAR | Status: DC | PRN
  Administered 2016-08-15: 1 mg via INTRAVENOUS
  Administered 2016-08-15: 2 mg via INTRAVENOUS
  Administered 2016-08-15: 1 mg via INTRAVENOUS

## 2016-08-15 MED ORDER — LIDOCAINE HCL 1 % IJ SOLN
INTRAMUSCULAR | Status: AC
  Filled 2016-08-15: qty 20

## 2016-08-15 MED ORDER — ACETAMINOPHEN 325 MG PO TABS: 650.0000 mg | ORAL_TABLET | ORAL | Status: DC | PRN

## 2016-08-15 MED ORDER — HEPARIN (PORCINE) IN NACL 2-0.9 UNIT/ML-% IJ SOLN: INTRAMUSCULAR | Status: DC | PRN

## 2016-08-15 MED ORDER — SODIUM CHLORIDE 0.9% FLUSH
3.0000 mL | Freq: Two times a day (BID) | INTRAVENOUS | Status: DC
  Administered 2016-08-15 – 2016-08-16 (×2): 3 mL via INTRAVENOUS

## 2016-08-15 MED ORDER — ONDANSETRON HCL 4 MG/2ML IJ SOLN: 4.0000 mg | Freq: Four times a day (QID) | INTRAMUSCULAR | Status: DC | PRN

## 2016-08-15 MED ORDER — MIDAZOLAM HCL 2 MG/2ML IJ SOLN
INTRAMUSCULAR | Status: AC
  Filled 2016-08-15: qty 2

## 2016-08-15 MED ORDER — VERAPAMIL HCL 2.5 MG/ML IV SOLN
INTRAVENOUS | Status: AC
  Filled 2016-08-15: qty 2

## 2016-08-15 MED ORDER — HEPARIN (PORCINE) IN NACL 2-0.9 UNIT/ML-% IJ SOLN
INTRAMUSCULAR | Status: AC
  Filled 2016-08-15: qty 500

## 2016-08-15 MED ORDER — IOPAMIDOL (ISOVUE-370) INJECTION 76%
INTRAVENOUS | Status: DC | PRN
  Administered 2016-08-15: 95 mL via INTRA_ARTERIAL

## 2016-08-15 MED ORDER — HEPARIN SODIUM (PORCINE) 1000 UNIT/ML IJ SOLN
INTRAMUSCULAR | Status: AC
  Filled 2016-08-15: qty 1

## 2016-08-15 MED ORDER — LIDOCAINE HCL (PF) 1 % IJ SOLN
INTRAMUSCULAR | Status: DC | PRN
  Administered 2016-08-15: 2 mL

## 2016-08-15 MED ORDER — IOPAMIDOL (ISOVUE-370) INJECTION 76%
INTRAVENOUS | Status: AC
  Filled 2016-08-15: qty 125

## 2016-08-15 MED ORDER — HEPARIN (PORCINE) IN NACL 2-0.9 UNIT/ML-% IJ SOLN
INTRAMUSCULAR | Status: AC | PRN
  Administered 2016-08-15: 1000 mL

## 2016-08-15 MED ORDER — FENTANYL CITRATE (PF) 100 MCG/2ML IJ SOLN
INTRAMUSCULAR | Status: AC
  Filled 2016-08-15: qty 2

## 2016-08-15 MED ORDER — FENTANYL CITRATE (PF) 100 MCG/2ML IJ SOLN
INTRAMUSCULAR | Status: DC | PRN
  Administered 2016-08-15 (×2): 25 ug via INTRAVENOUS

## 2016-08-15 MED ORDER — VERAPAMIL HCL 2.5 MG/ML IV SOLN
INTRAVENOUS | Status: DC | PRN
  Administered 2016-08-15: 10 mL via INTRA_ARTERIAL

## 2016-08-15 MED ORDER — SODIUM CHLORIDE 0.9 % IV SOLN: 250.0000 mL | INTRAVENOUS | Status: DC | PRN

## 2016-08-15 MED ORDER — SODIUM CHLORIDE 0.9% FLUSH: 3.0000 mL | INTRAVENOUS | Status: DC | PRN

## 2016-08-15 MED ORDER — HEPARIN SODIUM (PORCINE) 1000 UNIT/ML IJ SOLN
INTRAMUSCULAR | Status: DC | PRN
  Administered 2016-08-15: 4500 [IU] via INTRAVENOUS

## 2016-08-15 SURGICAL SUPPLY — 11 items
CATH INFINITI 5FR AL1 (CATHETERS) ×1 IMPLANT
CATH INFINITI 5FR MULTPACK ANG (CATHETERS) ×1 IMPLANT
DEVICE RAD COMP TR BAND LRG (VASCULAR PRODUCTS) ×1 IMPLANT
GLIDESHEATH SLEND SS 6F .021 (SHEATH) ×1 IMPLANT
GUIDEWIRE INQWIRE 1.5J.035X260 (WIRE) IMPLANT
INQWIRE 1.5J .035X260CM (WIRE) ×2
KIT HEART LEFT (KITS) ×2 IMPLANT
PACK CARDIAC CATHETERIZATION (CUSTOM PROCEDURE TRAY) ×2 IMPLANT
SYR MEDRAD MARK V 150ML (SYRINGE) ×2 IMPLANT
TRANSDUCER W/STOPCOCK (MISCELLANEOUS) ×2 IMPLANT
TUBING CIL FLEX 10 FLL-RA (TUBING) ×2 IMPLANT

## 2016-08-15 NOTE — Progress Notes (Addendum)
Progress Note  Patient Name: Samantha [name] Date of Encounter: 08/15/2016  Primary Cardiologist: Burt Knack  Subjective   No further chest pain, planned for cardiac cath today.   Inpatient Medications    Scheduled Meds: . aspirin EC  81 mg Oral Daily  . atorvastatin  20 mg Oral q1800  . carvedilol  6.25 mg Oral BID WC  . clonazePAM  0.5 mg Oral QHS  . ezetimibe  10 mg Oral QHS  . FLUoxetine  40 mg Oral Daily  . irbesartan  300 mg Oral QHS  . irbesartan  75 mg Oral Daily  . levofloxacin  750 mg Oral Daily  . loratadine  10 mg Oral Daily  . pantoprazole  40 mg Oral BID  . potassium chloride  10 mEq Oral BID  . sodium chloride flush  3 mL Intravenous Q12H   Continuous Infusions: . sodium chloride    . sodium chloride 75 mL/hr at 08/15/16 0604  . heparin 800 Units/hr (08/14/16 2300)  . nitroGLYCERIN     PRN Meds: sodium chloride, albuterol, ALPRAZolam, fluticasone, HYDROcodone-acetaminophen, nitroGLYCERIN, ondansetron (ZOFRAN) IV, sodium chloride flush, zolpidem   Vital Signs    Vitals:   08/14/16 2134 08/14/16 2258 08/15/16 0327 08/15/16 0839  BP: (!) 108/53 120/79 128/77 (!) 139/98  Pulse: 71 89 99 89  Resp: 16 19 20 19   Temp:  98.6 F (37 C) 98.7 F (37.1 C) 98.4 F (36.9 C)  TempSrc:    Oral  SpO2: 95% 96% 95% 96%  Weight:  196 lb 11.2 oz (89.2 kg) 196 lb (88.9 kg)   Height:  5' 3"  (1.6 m)      Intake/Output Summary (Last 24 hours) at 08/15/16 1130 Last data filed at 08/15/16 0700  Gross per 24 hour  Intake              207 ml  Output             1600 ml  Net            -1393 ml   Filed Weights   08/14/16 2037 08/14/16 2258 08/15/16 0327  Weight: 193 lb (87.5 kg) 196 lb 11.2 oz (89.2 kg) 196 lb (88.9 kg)    Telemetry    Afib rate controlled. - Personally Reviewed  ECG    Afib - Personally Reviewed  Physical Exam   General: Well developed, well nourished, female appearing in no acute distress. Head: Normocephalic, atraumatic.  Neck: Supple  without bruits, JVD. Lungs:  Resp regular and unlabored, CTA. Heart: Irreg Irreg, S1, S2, no S3, S4, or murmur; no rub. Abdomen: Soft, non-tender, non-distended with normoactive bowel sounds. No hepatomegaly. No rebound/guarding. No obvious abdominal masses. Extremities: No clubbing, cyanosis, 1+ LE edema (chronic). Distal pedal pulses are 2+ bilaterally. Neuro: Alert and oriented X 3. Moves all extremities spontaneously. Psych: Normal affect.  Labs    Chemistry Recent Labs Lab 08/14/16 1215 08/15/16 0309  NA 141 139  K 4.6 3.5  CL 105 103  CO2 28 28  GLUCOSE 95 111*  BUN 14 14  CREATININE 0.94 0.86  CALCIUM 9.2 8.9  PROT  --  6.5  ALBUMIN  --  3.7  AST  --  36  ALT  --  20  ALKPHOS  --  67  BILITOT  --  0.6  GFRNONAA >60 >60  GFRAA >60 >60  ANIONGAP 8 8     Hematology Recent Labs Lab 08/14/16 1215 08/15/16 0804  WBC 3.7* 3.1*  RBC 4.53 4.28  HGB 11.0* 10.6*  HCT 37.7 35.0*  MCV 83.2 81.8  MCH 24.3* 24.8*  MCHC 29.2* 30.3  RDW 17.0* 17.2*  PLT 73* 61*    Cardiac Enzymes Recent Labs Lab 08/14/16 2102 08/15/16 0309 08/15/16 0804  TROPONINI <0.03 <0.03 <0.03    Recent Labs Lab 08/14/16 1421  TROPIPOC 0.01     BNP Recent Labs Lab 08/14/16 1414  BNP 104.0*     DDimer No results for input(s): DDIMER in the last 168 hours.    Radiology    Dg Chest 2 View  Result Date: 08/14/2016 CLINICAL DATA:  Chest pain. EXAM: CHEST  2 VIEW COMPARISON:  07/26/2016 FINDINGS: Previous median sternotomy CABG procedure. Aortic atherosclerosis. Cardiac enlargement. No pleural effusion or edema. No airspace consolidation. Scar like opacities in both lungs appear similar to previous exam. IMPRESSION: 1. No acute cardiopulmonary abnormalities. 2.  Aortic Atherosclerosis (ICD10-I70.0). Electronically Signed   By: Kerby Moors M.D.   On: 08/14/2016 13:14    Cardiac Studies   N/A  Patient Profile     66 y.o. female with a hx of CABG 2001 w/ LIMA-OM, SVG-D1,  SVG-RCA), S-CHF, PCI CFX, cath 2016 w/ med rx, HTN, HLD, DM, obesity, esophageal varices requiring banding, non-ETOH cirrhosis, atrial flutter dx 05/2016 w/ CHADS2VASC=5 (female, age x 1, CHF, HTN, CAD) but not anticoag candidate 2/2 varices/cirrhosis who presented with chest pain.   Assessment & Plan    1. Chest pain/ CAD s/p CABG with stents: reports this is very similar to previous cardiac events, with pressure in her chest pain and pain down her left arm with numbness. She has ruled out.  -- Stop IV heparin due to hx of varies as trops have been negative.  -- planned fore cardia cath today, no further chest pain   2. Cough and DOE: Reports breathing is much improved this morning. Received one dose of IV lasix on admission. 1.6L UOP. Able to lay flat in bed.   3. Persistent Afib: reports she was first diagnosed about 2 months ago in the office with Dr. Burt Knack. Rates overall controlled. No anticoagulation 2/2 cirrhosis and esophogeal varices.   4. HL: on low dose statin   Signed, Reino Bellis, NP  08/15/2016, 11:30 AM    I have examined the patient and reviewed assessment and plan and discussed with patient.  Agree with above as stated.  Stop heparin.  Troponin negative.  Already scheduled for cath.  Able to lie flat now.  SHe feels that she is back to normal volume.   If PCI needed, consider SYNERGY stent to allow for shortening of DAPT period, in the setting of esophageal varices.  Larae Grooms

## 2016-08-15 NOTE — Progress Notes (Signed)
[place] for heparin Indication: chest pain/ACS  Allergies  Allergen Reactions  . Acetaminophen Other (See Comments)    Liver problems  . Oxycodone Itching and Nausea Only  . Ace Inhibitors Other (See Comments)  . Cefaclor Itching and Rash  . Cephalexin Itching and Rash  . Penicillins Rash    Has patient had a PCN reaction causing immediate rash, facial/tongue/throat swelling, SOB or lightheadedness with hypotension: YES Has patient had a PCN reaction causing severe rash involving mucus membranes or skin necrosis: NO Has patient had a PCN reaction that required hospitalization: YES Has patient had a PCN reaction occurring within the last 10 years: NO If all of the above answers are "NO", then may proceed with Cephalosporin use.   . Pregabalin Other (See Comments)    Retains fluid  . Tape Itching and Rash    Takes skin right off with medical tape    Patient Measurements: Height: 5\' 3"  (160 cm) Weight: 196 lb (88.9 kg) IBW/kg (Calculated) : 52.4 Heparin Dosing Weight: 70 kg  Vital Signs: Temp: 98.4 F (36.9 C) (06/18 0839) Temp Source: Oral (06/18 0839) BP: 139/98 (06/18 0839) Pulse Rate: 89 (06/18 0839)  Labs:  Recent Labs  08/14/16 1215 08/14/16 1719 08/14/16 2102 08/15/16 0309 08/15/16 0804  HGB 11.0*  --   --   --  10.6*  HCT 37.7  --   --   --  35.0*  PLT 73*  --   --   --  61*  LABPROT  --  16.2*  --   --   --   INR  --  1.29  --   --   --   HEPARINUNFRC  --   --   --  0.42 0.41  CREATININE 0.94  --   --  0.86  --   TROPONINI  --   --  <0.03 <0.03 <0.03    Estimated Creatinine Clearance: 67.1 mL/min (by C-G formula based on SCr of 0.86 mg/dL).  Assessment: 66 yo f presenting with CP, on heparin gtt for ACS. Confirmatory heparin level is therapeutic at 0.42. Plans for cath later today.    Goal of Therapy:  Heparin level 0.3-0.7 units/ml Monitor platelets by anticoagulation protocol: Yes   Plan:  1. Continue  heparin infusion at 800 units/hr 2. Daily heparin level and CBC while on heparin    Vincenza Hews, PharmD, BCPS 08/15/2016, 9:58 AM

## 2016-08-15 NOTE — H&P (View-Only) (Signed)
Progress Note  Patient Name: Samantha [name] Date of Encounter: 08/15/2016  Primary Cardiologist: Burt Knack  Subjective   No further chest pain, planned for cardiac cath today.   Inpatient Medications    Scheduled Meds: . aspirin EC  81 mg Oral Daily  . atorvastatin  20 mg Oral q1800  . carvedilol  6.25 mg Oral BID WC  . clonazePAM  0.5 mg Oral QHS  . ezetimibe  10 mg Oral QHS  . FLUoxetine  40 mg Oral Daily  . irbesartan  300 mg Oral QHS  . irbesartan  75 mg Oral Daily  . levofloxacin  750 mg Oral Daily  . loratadine  10 mg Oral Daily  . pantoprazole  40 mg Oral BID  . potassium chloride  10 mEq Oral BID  . sodium chloride flush  3 mL Intravenous Q12H   Continuous Infusions: . sodium chloride    . sodium chloride 75 mL/hr at 08/15/16 0604  . heparin 800 Units/hr (08/14/16 2300)  . nitroGLYCERIN     PRN Meds: sodium chloride, albuterol, ALPRAZolam, fluticasone, HYDROcodone-acetaminophen, nitroGLYCERIN, ondansetron (ZOFRAN) IV, sodium chloride flush, zolpidem   Vital Signs    Vitals:   08/14/16 2134 08/14/16 2258 08/15/16 0327 08/15/16 0839  BP: (!) 108/53 120/79 128/77 (!) 139/98  Pulse: 71 89 99 89  Resp: 16 19 20 19   Temp:  98.6 F (37 C) 98.7 F (37.1 C) 98.4 F (36.9 C)  TempSrc:    Oral  SpO2: 95% 96% 95% 96%  Weight:  196 lb 11.2 oz (89.2 kg) 196 lb (88.9 kg)   Height:  5' 3"  (1.6 m)      Intake/Output Summary (Last 24 hours) at 08/15/16 1130 Last data filed at 08/15/16 0700  Gross per 24 hour  Intake              207 ml  Output             1600 ml  Net            -1393 ml   Filed Weights   08/14/16 2037 08/14/16 2258 08/15/16 0327  Weight: 193 lb (87.5 kg) 196 lb 11.2 oz (89.2 kg) 196 lb (88.9 kg)    Telemetry    Afib rate controlled. - Personally Reviewed  ECG    Afib - Personally Reviewed  Physical Exam   General: Well developed, well nourished, female appearing in no acute distress. Head: Normocephalic, atraumatic.  Neck: Supple  without bruits, JVD. Lungs:  Resp regular and unlabored, CTA. Heart: Irreg Irreg, S1, S2, no S3, S4, or murmur; no rub. Abdomen: Soft, non-tender, non-distended with normoactive bowel sounds. No hepatomegaly. No rebound/guarding. No obvious abdominal masses. Extremities: No clubbing, cyanosis, 1+ LE edema (chronic). Distal pedal pulses are 2+ bilaterally. Neuro: Alert and oriented X 3. Moves all extremities spontaneously. Psych: Normal affect.  Labs    Chemistry Recent Labs Lab 08/14/16 1215 08/15/16 0309  NA 141 139  K 4.6 3.5  CL 105 103  CO2 28 28  GLUCOSE 95 111*  BUN 14 14  CREATININE 0.94 0.86  CALCIUM 9.2 8.9  PROT  --  6.5  ALBUMIN  --  3.7  AST  --  36  ALT  --  20  ALKPHOS  --  67  BILITOT  --  0.6  GFRNONAA >60 >60  GFRAA >60 >60  ANIONGAP 8 8     Hematology Recent Labs Lab 08/14/16 1215 08/15/16 0804  WBC 3.7* 3.1*  RBC 4.53 4.28  HGB 11.0* 10.6*  HCT 37.7 35.0*  MCV 83.2 81.8  MCH 24.3* 24.8*  MCHC 29.2* 30.3  RDW 17.0* 17.2*  PLT 73* 61*    Cardiac Enzymes Recent Labs Lab 08/14/16 2102 08/15/16 0309 08/15/16 0804  TROPONINI <0.03 <0.03 <0.03    Recent Labs Lab 08/14/16 1421  TROPIPOC 0.01     BNP Recent Labs Lab 08/14/16 1414  BNP 104.0*     DDimer No results for input(s): DDIMER in the last 168 hours.    Radiology    Dg Chest 2 View  Result Date: 08/14/2016 CLINICAL DATA:  Chest pain. EXAM: CHEST  2 VIEW COMPARISON:  07/26/2016 FINDINGS: Previous median sternotomy CABG procedure. Aortic atherosclerosis. Cardiac enlargement. No pleural effusion or edema. No airspace consolidation. Scar like opacities in both lungs appear similar to previous exam. IMPRESSION: 1. No acute cardiopulmonary abnormalities. 2.  Aortic Atherosclerosis (ICD10-I70.0). Electronically Signed   By: Kerby Moors M.D.   On: 08/14/2016 13:14    Cardiac Studies   N/A  Patient Profile     66 y.o. female with a hx of CABG 2001 w/ LIMA-OM, SVG-D1,  SVG-RCA), S-CHF, PCI CFX, cath 2016 w/ med rx, HTN, HLD, DM, obesity, esophageal varices requiring banding, non-ETOH cirrhosis, atrial flutter dx 05/2016 w/ CHADS2VASC=5 (female, age x 1, CHF, HTN, CAD) but not anticoag candidate 2/2 varices/cirrhosis who presented with chest pain.   Assessment & Plan    1. Chest pain/ CAD s/p CABG with stents: reports this is very similar to previous cardiac events, with pressure in her chest pain and pain down her left arm with numbness. She has ruled out.  -- Stop IV heparin due to hx of varies as trops have been negative.  -- planned fore cardia cath today, no further chest pain   2. Cough and DOE: Reports breathing is much improved this morning. Received one dose of IV lasix on admission. 1.6L UOP. Able to lay flat in bed.   3. Persistent Afib: reports she was first diagnosed about 2 months ago in the office with Dr. Burt Knack. Rates overall controlled. No anticoagulation 2/2 cirrhosis and esophogeal varices.   4. HL: on low dose statin   Signed, Reino Bellis, NP  08/15/2016, 11:30 AM    I have examined the patient and reviewed assessment and plan and discussed with patient.  Agree with above as stated.  Stop heparin.  Troponin negative.  Already scheduled for cath.  Able to lie flat now.  SHe feels that she is back to normal volume.   If PCI needed, consider SYNERGY stent to allow for shortening of DAPT period, in the setting of esophageal varices.  Larae Grooms

## 2016-08-15 NOTE — Consult Note (Signed)
           [facility] CM Primary Care Navigator  08/15/2016  Samantha [name] Jun 21, 1950 [id number]   Went to see patientat the bedside earlier to identify possible discharge needsbut staff reports that patient is in cath lab for a procedure at this time.   Will attempt to meet with patient at another time when she is available in the room.   For questions, please contact:  Dannielle Huh, BSN, RN- [facility] Primary Care Navigator  Telephone: [phone] [place]

## 2016-08-15 NOTE — Interval H&P Note (Signed)
Cath Lab Visit (complete for each Cath Lab visit)  Clinical Evaluation Leading to the Procedure:   ACS: Yes.    Non-ACS:    Anginal Classification: CCS III  Anti-ischemic medical therapy: No Therapy  Non-Invasive Test Results: No non-invasive testing performed  Prior CABG: Previous CABG      History and Physical Interval Note:  08/15/2016 4:12 PM  Samantha [name]  has presented today for surgery, with the diagnosis of unstable angina  The various methods of treatment have been discussed with the patient and family. After consideration of risks, benefits and other options for treatment, the patient has consented to  Procedure(s): Left Heart Cath and Cors/Grafts Angiography (N/A) as a surgical intervention .  The patient's history has been reviewed, patient examined, no change in status, stable for surgery.  I have reviewed the patient's chart and labs.  Questions were answered to the patient's satisfaction.     Larae Grooms

## 2016-08-15 NOTE — Progress Notes (Signed)
ANTICOAGULATION CONSULT NOTE - Follow Up Consult  Pharmacy Consult for heparin Indication: chest pain/ACS  Labs:  Recent Labs  08/14/16 1215 08/14/16 1719 08/14/16 2102 08/15/16 0309  HGB 11.0*  --   --   --   HCT 37.7  --   --   --   PLT 73*  --   --   --   LABPROT  --  16.2*  --   --   INR  --  1.29  --   --   HEPARINUNFRC  --   --   --  0.42  CREATININE 0.94  --   --  0.86  TROPONINI  --   --  <0.03 <0.03    Assessment/Plan:  66yo female therapeutic on heparin with initial dosing for CP. Will continue gtt at current rate and confirm stable with additional level.   Wynona Neat, PharmD, BCPS  08/15/2016,6:06 AM

## 2016-08-16 ENCOUNTER — Other Ambulatory Visit: Payer: Self-pay | Admitting: Cardiology

## 2016-08-16 ENCOUNTER — Encounter (HOSPITAL_COMMUNITY): Payer: Self-pay | Admitting: Interventional Cardiology

## 2016-08-16 ENCOUNTER — Inpatient Hospital Stay (HOSPITAL_COMMUNITY): Payer: Medicare Other

## 2016-08-16 ENCOUNTER — Other Ambulatory Visit (HOSPITAL_COMMUNITY): Payer: Medicare Other

## 2016-08-16 DIAGNOSIS — Z79899 Other long term (current) drug therapy: Secondary | ICD-10-CM

## 2016-08-16 DIAGNOSIS — Z951 Presence of aortocoronary bypass graft: Secondary | ICD-10-CM

## 2016-08-16 DIAGNOSIS — I5021 Acute systolic (congestive) heart failure: Secondary | ICD-10-CM

## 2016-08-16 DIAGNOSIS — I4819 Other persistent atrial fibrillation: Secondary | ICD-10-CM

## 2016-08-16 LAB — CBC
HCT: 35.2 % — ABNORMAL LOW (ref 36.0–46.0)
Hemoglobin: 10.6 g/dL — ABNORMAL LOW (ref 12.0–15.0)
MCH: 24.4 pg — ABNORMAL LOW (ref 26.0–34.0)
MCHC: 30.1 g/dL (ref 30.0–36.0)
MCV: 81.1 fL (ref 78.0–100.0)
Platelets: 50 10*3/uL — ABNORMAL LOW (ref 150–400)
RBC: 4.34 MIL/uL (ref 3.87–5.11)
RDW: 17.2 % — ABNORMAL HIGH (ref 11.5–15.5)
WBC: 3 10*3/uL — ABNORMAL LOW (ref 4.0–10.5)

## 2016-08-16 MED ORDER — SPIRONOLACTONE 25 MG PO TABS: 25.0000 mg | ORAL_TABLET | Freq: Every day | ORAL | 2 refills | Status: DC

## 2016-08-16 MED ORDER — ALUM & MAG HYDROXIDE-SIMETH 200-200-20 MG/5ML PO SUSP
30.0000 mL | Freq: Four times a day (QID) | ORAL | Status: DC | PRN
  Administered 2016-08-16: 30 mL via ORAL
  Filled 2016-08-16: qty 30

## 2016-08-16 NOTE — Plan of Care (Signed)
Problem: Education: Goal: Understanding of CV disease, CV risk reduction, and recovery process will improve Outcome: Completed/Met Date Met: 08/16/16 Patient has had this procedure previously and is able to pretty accurately explain what to expect and what to look for as far as the incision site, her limitations and D/C.

## 2016-08-16 NOTE — Plan of Care (Signed)
Problem: Safety: Goal: Ability to remain free from injury will improve Outcome: Completed/Met Date Met: 08/16/16 Patient uses call light appropriately and has personal items within reach.

## 2016-08-16 NOTE — Plan of Care (Signed)
Problem: Education: Goal: Knowledge of [facility] Education information/materials will improve Outcome: Completed/Met Date Met: 08/16/16 Reviewed Welcome packet and unit policies, numbers to call and meds, procedures and tests and patient verbalized understanding.

## 2016-08-16 NOTE — Consult Note (Signed)
           [facility] CM Primary Care Navigator  08/16/2016  [place] 1950/04/13 [id number]   Went back to see patientat the bedside to identify possible discharge needs. Patient states having chest pains and shortness of breath that had led to this admission.  [facility] as the primary care provider.   Patient shared using [facility] in [place] to obtain medications without difficulty.   Patient reports managing her own medications at home with use of "pill box" system filled weekly.  Patient states that she is able todriveprior to admission but her cousin Blanch Media) can provide transportation to her doctors' appointments as needed after discharge.  She is independent and active prior to hospitalization. Her cousin Blanch Media- who lives close by) will able to assist her at home when needed according to patient.          Discharge plan is home as stated.  Patientexpressedunderstanding to call primary care provider's officewhen she returns home,for a post discharge follow-up appointment within a week or sooner if needed.Patient letter (with PCP's contact number) was provided as areminder.  Explained to patient about [facility] CM services available for health management of chronic illness but patient verbalized awareness of ways to self-manage at home. She expressed understanding to get referralfrom primary care provider to [facility] care management if deemed necessary for any services in the future. [facility] care management contact information provided for future needs that may arise.  For questions, please contact:  Dannielle Huh, BSN, RN- [facility] Primary Care Navigator  Telephone: [phone] [place]

## 2016-08-16 NOTE — Discharge Summary (Signed)
Discharge Summary    Patient ID: Samantha [name],  MRN: [id number], DOB/AGE: 1950/11/09 66 y.o.  Admit date: 08/14/2016 Discharge date: 08/16/2016  Primary Care Provider: [name] Primary Cardiologist: Burt Knack   Discharge Diagnoses    Active Problems:   Mixed hyperlipidemia   S/P CABG x 3- 2001   ACS (acute coronary syndrome) (HCC)   Unstable angina pectoris (HCC)   Acute systolic heart failure (HCC)   Persistent atrial fibrillation (HCC)   Allergies Allergies  Allergen Reactions  . Acetaminophen Other (See Comments)    Liver problems  . Oxycodone Itching and Nausea Only  . Ace Inhibitors Other (See Comments)  . Cefaclor Itching and Rash  . Cephalexin Itching and Rash  . Penicillins Rash    Has patient had a PCN reaction causing immediate rash, facial/tongue/throat swelling, SOB or lightheadedness with hypotension: YES Has patient had a PCN reaction causing severe rash involving mucus membranes or skin necrosis: NO Has patient had a PCN reaction that required hospitalization: YES Has patient had a PCN reaction occurring within the last 10 years: NO If all of the above answers are "NO", then may proceed with Cephalosporin use.   . Pregabalin Other (See Comments)    Retains fluid  . Tape Itching and Rash    Takes skin right off with medical tape    Diagnostic Studies/Procedures    LHC: 08/15/16  Conclusion     Ost 1st Diag to 1st Diag lesion, 65 %stenosed. SVG to diagonal is patent.  LIMA attached to SVG to diagonal as a Y graft. LIMA is atretic.  Prox RCA to Mid RCA lesion, 95 %stenosed. SVG to RCA is patent.  Ost Cx to Mid Cx lesion, 40 %stenosed.  The left ventricular ejection fraction is 25-35% by visual estimate.  There is moderate to severe left ventricular systolic dysfunction.  LV end diastolic pressure is moderately elevated.  There is no aortic valve stenosis.   No significant change in coronary circulation.  EF has decreased from  prior report, with more pronounced inferior hypokinesis.  May need EP f/u for possible AICD placement.  Watch overnight, and plan for discharge in AM if no other procedures required.  _____________   History of Present Illness     66 y.o. female with a hx of CABG 2001 w/ LIMA-OM, SVG-D1, SVG-RCA), S-CHF, PCI CFX, cath 2016 w/ med rx, HTN, HLD, DM, obesity, esophageal varices requiring banding, non-ETOH cirrhosis, atrial flutter dx 05/2016 w/ CHADS2VASC=5 (female, age x 1, CHF, HTN, CAD) but not anticoag candidate 2nd varices/cirrhosis, who presented with chest pain. Samantha [name] was in her usual state of health until about a month ago. She developed a cough, but no other sx at first. Then she got increasing DOE, could not walk room-room. She also got LE edema. The edema improved with increased Lasix dosing. She continued to cough, the cough would not allow her to sleep.  She got chest pressure, squeezing, and it went through to her back. It was associated with L arm numbness and pain. The SOB was a little worse. When she was not able to sleep for the last 4 nights prior to admission, because of the coughing and SOB, she decided to come to the ER. In the ER, she was given SL NTG with improvement in her symptoms. Her chest pain is currently 6/10, the arm pain is a 7/10. There is some L arm numbness as well.   Although her edema has improved, she  still has some. Given her reported symptoms she was admitted with plans to undergo cath.   Hospital Course     Given one dose of IV lasix with improvement in respiratory status. She ruled out and underwent cath with Dr. Irish Lack showing no change in coronary anatomy. LV gram noted EF at 25-35%. Remained in Afib throughout admission, but rates were overall controlled. No ectopy noted on telemetry. No OAC 2/2 cirrhosis and esophogeal varices. Given this new drop in EF she was started on spironolactone 54m daily. She felt well post cath. Suspect drop in EF may be  related to new diagnosis of Afib. If EF remains down, may need EP input regarding device.    She was seen by Dr. VIrish Lackand determined stable for discharge home. Follow up in the office has been arranged. Medications are listed below.  _____________  Discharge Vitals Blood pressure 100/60, pulse (!) 110, temperature 97.9 F (36.6 C), temperature source Oral, resp. rate 17, height 5' 3"  (1.6 m), weight 199 lb 1.6 oz (90.3 kg), last menstrual period 03/29/2011, SpO2 94 %.  Filed Weights   08/14/16 2258 08/15/16 0327 08/16/16 0450  Weight: 196 lb 11.2 oz (89.2 kg) 196 lb (88.9 kg) 199 lb 1.6 oz (90.3 kg)    Labs & Radiologic Studies    CBC  Recent Labs  08/15/16 0804 08/16/16 0422  WBC 3.1* 3.0*  HGB 10.6* 10.6*  HCT 35.0* 35.2*  MCV 81.8 81.1  PLT 61* 50*   Basic Metabolic Panel  Recent Labs  08/14/16 1215 08/15/16 0309  NA 141 139  K 4.6 3.5  CL 105 103  CO2 28 28  GLUCOSE 95 111*  BUN 14 14  CREATININE 0.94 0.86  CALCIUM 9.2 8.9   Liver Function Tests  Recent Labs  08/15/16 0309  AST 36  ALT 20  ALKPHOS 67  BILITOT 0.6  PROT 6.5  ALBUMIN 3.7   No results for input(s): LIPASE, AMYLASE in the last 72 hours. Cardiac Enzymes  Recent Labs  08/14/16 2102 08/15/16 0309 08/15/16 0804  TROPONINI <0.03 <0.03 <0.03   BNP Invalid input(s): POCBNP D-Dimer No results for input(s): DDIMER in the last 72 hours. Hemoglobin A1C No results for input(s): HGBA1C in the last 72 hours. Fasting Lipid Panel  Recent Labs  08/15/16 0309  CHOL 143  HDL 65  LDLCALC 68  TRIG 50  CHOLHDL 2.2   Thyroid Function Tests No results for input(s): TSH, T4TOTAL, T3FREE, THYROIDAB in the last 72 hours.  Invalid input(s): FREET3 _____________  Dg Chest 2 View  Result Date: 08/14/2016 CLINICAL DATA:  Chest pain. EXAM: CHEST  2 VIEW COMPARISON:  07/26/2016 FINDINGS: Previous median sternotomy CABG procedure. Aortic atherosclerosis. Cardiac enlargement. No pleural  effusion or edema. No airspace consolidation. Scar like opacities in both lungs appear similar to previous exam. IMPRESSION: 1. No acute cardiopulmonary abnormalities. 2.  Aortic Atherosclerosis (ICD10-I70.0). Electronically Signed   By: TKerby MoorsM.D.   On: 08/14/2016 13:14    Disposition   Pt is being discharged home today in good condition.  Follow-up Plans & Appointments    Follow-up Information    WLiliane Shi PA-C Follow up on 08/30/2016.   Specialties:  Cardiology, Physician Assistant Why:  at 8:45am for your follow up appt [address]: [id number]        [place] up on 08/22/2016.   Specialty:  Cardiology Why:  Please come in  to have your labs checked.  Contact information: [address] [place] [phone]         Discharge Instructions    ([place]) Call MD:  Anytime you have any of the following symptoms: 1) 3 pound weight gain in 24 hours or 5 pounds in 1 week 2) shortness of breath, with or without a dry hacking cough 3) swelling in the hands, feet or stomach 4) if you have to sleep on extra pillows at night in order to breathe.    Complete by:  As directed    Call MD for:  redness, tenderness, or signs of infection (pain, swelling, redness, odor or green/yellow discharge around incision site)    Complete by:  As directed    Diet - low sodium heart healthy    Complete by:  As directed    Discharge instructions    Complete by:  As directed    Radial Site Care Refer to this sheet in the next few weeks. These instructions provide you with information on caring for yourself after your procedure. Your caregiver may also give you more specific instructions. Your treatment has been planned according to current medical practices, but problems sometimes occur. Call your caregiver if you have any problems or questions  after your procedure. HOME CARE INSTRUCTIONS You may shower the day after the procedure.Remove the bandage (dressing) and gently wash the site with plain soap and water.Gently pat the site dry.  Do not apply powder or lotion to the site.  Do not submerge the affected site in water for 3 to 5 days.  Inspect the site at least twice daily.  Do not flex or bend the affected arm for 24 hours.  No lifting over 5 pounds (2.3 kg) for 5 days after your procedure.  Do not drive home if you are discharged the same day of the procedure. Have someone else drive you.  You may drive 24 hours after the procedure unless otherwise instructed by your caregiver.  What to expect: Any bruising will usually fade within 1 to 2 weeks.  Blood that collects in the tissue (hematoma) may be painful to the touch. It should usually decrease in size and tenderness within 1 to 2 weeks.  SEEK IMMEDIATE MEDICAL CARE IF: You have unusual pain at the radial site.  You have redness, warmth, swelling, or pain at the radial site.  You have drainage (other than a small amount of blood on the dressing).  You have chills.  You have a fever or persistent symptoms for more than 72 hours.  You have a fever and your symptoms suddenly get worse.  Your arm becomes pale, cool, tingly, or numb.  You have heavy bleeding from the site. Hold pressure on the site.    For patients with congestive heart failure, we give them these special instructions:  1. Follow a low-salt diet and watch your fluid intake. In general, you should not be taking in more than 2 liters of fluid per day (no more than 8 glasses per day). Some patients are restricted to less than 1.5 liters of fluid per day (no more than 6 glasses per day). This includes sources of water in foods like soup, coffee, tea, milk, etc. 2. Weigh yourself on the same scale at same time of day and keep a log. 3. Call your doctor: (Anytime you feel any of the following symptoms)  - 3-4 pound  weight gain in 1-2 days  or 2 pounds overnight  - Shortness of breath, with or without a dry hacking cough  - Swelling in the hands, feet or stomach  - If you have to sleep on extra pillows at night in order to breathe   IT IS IMPORTANT TO LET YOUR DOCTOR KNOW EARLY ON IF YOU ARE HAVING SYMPTOMS SO WE CAN HELP YOU!   Increase activity slowly    Complete by:  As directed       Discharge Medications   Current Discharge Medication List    START taking these medications   Details  spironolactone (ALDACTONE) 25 MG tablet Take 1 tablet (25 mg total) by mouth daily. Qty: 30 tablet, Refills: 2      CONTINUE these medications which have NOT CHANGED   Details  albuterol (PROVENTIL HFA;VENTOLIN HFA) 108 (90 BASE) MCG/ACT inhaler Inhale 1-2 puffs into the lungs every 6 (six) hours as needed for wheezing or shortness of breath.    aspirin (ASPIRIN EC) 81 MG EC tablet Take 81 mg by mouth daily. Swallow whole.    atorvastatin (LIPITOR) 20 MG tablet Take 20 mg by mouth daily at 6 PM.     carvedilol (COREG) 6.25 MG tablet Take 1 tablet (6.25 mg total) by mouth 2 (two) times daily with a meal. Qty: 180 tablet, Refills: 3    cetirizine (ZYRTEC) 10 MG tablet Take 10 mg by mouth daily as needed for allergies.     clonazePAM (KLONOPIN) 0.5 MG tablet Take 0.5 mg by mouth daily as needed (sleep).     ezetimibe (ZETIA) 10 MG tablet Take 10 mg by mouth at bedtime.     FLUoxetine (PROZAC) 40 MG capsule Take 40 mg by mouth every other day.     fluticasone (FLONASE) 50 MCG/ACT nasal spray Place 2 sprays into both nostrils daily as needed for allergies.    furosemide (LASIX) 40 MG tablet TAKE 2 TABLETS BY MOUTH ONCE DAILY TAKE  1  EXTRA  TABLET  DAILY  FOR  WEIGHT  GAIN  OF  2  POUNDS  OVERNIGHT Qty: 180 tablet, Refills: 2    HYDROcodone-acetaminophen (NORCO) 10-325 MG tablet Take 1 tablet by mouth every 6 (six) hours as needed (for cough also).    isosorbide mononitrate (IMDUR) 60 MG 24 hr tablet  Take 1 tablet (60 mg total) by mouth daily. Qty: 30 tablet, Refills: 11   Associated Diagnoses: Coronary artery disease involving native coronary artery of native heart with angina pectoris (HCC)    levofloxacin (LEVAQUIN) 750 MG tablet Take 750 mg by mouth daily.    NITROSTAT 0.4 MG SL tablet Place 0.4 mg under the tongue every 5 (five) minutes as needed for chest pain (x 3 tabs daily).     pantoprazole (PROTONIX) 40 MG tablet Take 1 tablet (40 mg total) by mouth 2 (two) times daily. Qty: 180 tablet, Refills: 2    potassium chloride (K-DUR) 10 MEQ tablet Take 1 tablet (10 mEq total) by mouth daily. Qty: 90 tablet, Refills: 3    valsartan (DIOVAN) 160 MG tablet Take 80-160 mg by mouth See admin instructions. Patient states that she takes 160 mg in the morning , and 80 mg at night.        Outstanding Labs/Studies   BMET 6/25, follow up echo in 6 weeks.   Duration of Discharge Encounter   Greater than 30 minutes including physician time.  Signed, Reino Bellis NP-C 08/16/2016, 3:13 PM   I have examined the patient and reviewed assessment  and plan and discussed with patient.  Agree with above as stated.  Left radial site stable.  Appears to be euvolemic.  EF decreased since prior cath despite no change in coronary circulation.  Start Aldacone 25 mg daily with BMet in one week.  She will take extra Lasix when she retains fluid.  Hopefully, this will help EF and diuresis.  D/w Dr. Burt Knack regarding further EP eval for AICD.  At this time, would hold off given multiple other comorbidities including cirrhosis with esophageal varices.    Larae Grooms

## 2016-08-16 NOTE — Progress Notes (Signed)
Reviewed discharge instructions with patient and she stated her understanding.  Reviewed heart failure instructions also.  Discharged home with family via wheelchair. Sanda Linger

## 2016-08-16 NOTE — Plan of Care (Signed)
Problem: Cardiovascular: Goal: Ability to achieve and maintain adequate cardiovascular perfusion will improve Outcome: Progressing Patient has a left radial incision site that has some ecchymosis but is soft, nontender with good pulses, color, circulation and temperature, will continue to monitor, TR band came off at 2300 and small pressure dressing applied after cleaning up site, patient tolerated well.

## 2016-08-16 NOTE — Progress Notes (Signed)
Report received via Paulette RN at the bedside using SBAR format, looked at Left radial site and reviewed cath results and what needs done prior to removal of TR band, VS, assumed care of patient.

## 2016-08-16 NOTE — Discharge Instructions (Signed)
Acute Coronary Syndrome °Acute coronary syndrome (ACS) is a serious problem in which there is suddenly not enough blood and oxygen supplied to the heart. ACS may mean that one or more of the blood vessels in your heart (coronary arteries) may be blocked. ACS can result in chest pain or a heart attack (myocardial infarction or MI). °What are the causes? °This condition is caused by atherosclerosis, which is the buildup of fat and cholesterol (plaque) on the inside of the arteries. Over time, the plaque may narrow or block the artery, and this will lessen blood flow to the heart. Plaque can also become weak and break off within a coronary artery to form a clot and cause a sudden blockage. °What increases the risk? °The risk factors of this condition include: °· High cholesterol levels. °· High blood pressure (hypertension). °· Smoking. °· Diabetes. °· Age. °· Family history of chest pain, heart disease, or stroke. °· Lack of exercise. °What are the signs or symptoms? °The most common signs of this condition include: °· Chest pain, which can be: °¨ A crushing or squeezing in the chest. °¨ A tightness, pressure, fullness, or heaviness in the chest. °¨ Present for more than a few minutes, or it can stop and recur. °· Pain in the arms, neck, jaw, or back. °· Unexplained heartburn or indigestion. °· Shortness of breath. °· Nausea. °· Sudden cold sweats. °· Feeling light-headed or dizzy. °Sometimes, this condition has no symptoms. °How is this diagnosed? °ACS may be diagnosed through the following tests: °· Electrocardiogram (ECG). °· Blood tests. °· Coronary angiogram. This is a procedure to look at the coronary arteries to see if there is any blockage. °How is this treated? °Treatment for ACS may include: °· Healthy behavioral changes to reduce or control risk factors. °· Medicine. °· Coronary stenting. A stent helps to keep an artery open. °· Coronary angioplasty. This procedure widens a narrowed or blocked  artery. °· Coronary artery bypass surgery. This will allow your blood to pass the blockage (bypass) to reach your heart. °Follow these instructions at home: °Eating and drinking °· Follow a heart-healthy diet. A dietitian can you help to educate you about healthy food options and changes. °· Use healthy cooking methods such as roasting, grilling, broiling, baking, poaching, steaming, or stir-frying. Talk to a dietitian to learn more about healthy cooking methods. °Medicines °· Take medicines only as directed by your health care provider. °· Do not take the following medicines unless your health care provider approves: °¨ Nonsteroidal anti-inflammatory drugs (NSAIDs), such as ibuprofen, naproxen, or celecoxib. °¨ Vitamin supplements that contain vitamin A, vitamin E, or both. °¨ Hormone replacement therapy that contains estrogen with or without progestin. °· Stop illegal drug use. °Activity °· Follow an exercise program that is approved by your health care provider. °· Plan rest periods when you are fatigued. °Lifestyle °· Do not use any tobacco products, including cigarettes, chewing tobacco, or electronic cigarettes. If you need help quitting, ask your health care provider. °· If you drink alcohol, and your health care provider approves, limit your alcohol intake to no more than 1 drink per day. One drink equals 12 ounces of beer, 5 ounces of wine, or 1½ ounces of hard liquor. °· Learn to manage stress. °· Maintain a healthy weight. Lose weight as approved by your health care provider. °General instructions °· Manage other health conditions, such as hypertension and diabetes, as directed by your health care provider. °· Keep all follow-up visits as directed by your   health care provider. This is important. °· Your health care provider may ask you to monitor your blood pressure. A blood pressure reading consists of a higher number over a lower number, such as 110 over 72, written as 110/72. Ideally, your blood  pressure should be: °¨ Below 140/90 if you have no other medical conditions. °¨ Below 130/80 if you have diabetes or kidney disease. °Get help right away if: °· You have pain in your chest, neck, arm, jaw, stomach, or back that lasts more than a few minutes, is recurring, or is not relieved by taking medicine under your tongue (sublingual nitroglycerin). °· You have profuse sweating without cause. °· You have unexplained: °¨ Heartburn or indigestion. °¨ Shortness of breath or difficulty breathing. °¨ Nausea or vomiting. °¨ Fatigue. °¨ Feelings of nervousness or anxiety. °¨ Weakness. °¨ Diarrhea. °· You have sudden light-headedness or dizziness. °· You faint. °These symptoms may represent a serious problem that is an emergency. Do not wait to see if the symptoms will go away. Get medical help right away. Call your local emergency services (911 in the [place] Do not drive yourself to the clinic or hospital.  °This information is not intended to replace advice given to you by your health care provider. Make sure you discuss any questions you have with your health care provider. °Document Released: 02/14/2005 Document Revised: 07/29/2015 Document Reviewed: 06/18/2013 °Elsevier Interactive Patient Education © 2017 Elsevier Inc. ° °

## 2016-08-30 ENCOUNTER — Ambulatory Visit: Payer: Medicare Other | Admitting: Physician Assistant

## 2016-09-13 DIAGNOSIS — Z6834 Body mass index (BMI) 34.0-34.9, adult: Secondary | ICD-10-CM | POA: Diagnosis not present

## 2016-09-13 DIAGNOSIS — R05 Cough: Secondary | ICD-10-CM | POA: Diagnosis not present

## 2016-09-13 DIAGNOSIS — I5020 Unspecified systolic (congestive) heart failure: Secondary | ICD-10-CM | POA: Diagnosis not present

## 2016-09-13 DIAGNOSIS — Z0001 Encounter for general adult medical examination with abnormal findings: Secondary | ICD-10-CM | POA: Diagnosis not present

## 2016-09-13 DIAGNOSIS — K210 Gastro-esophageal reflux disease with esophagitis: Secondary | ICD-10-CM | POA: Diagnosis not present

## 2016-09-13 DIAGNOSIS — M545 Low back pain: Secondary | ICD-10-CM | POA: Diagnosis not present

## 2016-09-13 DIAGNOSIS — I10 Essential (primary) hypertension: Secondary | ICD-10-CM | POA: Diagnosis not present

## 2016-10-06 ENCOUNTER — Other Ambulatory Visit: Payer: Self-pay | Admitting: Cardiovascular Disease

## 2016-10-06 DIAGNOSIS — I25119 Atherosclerotic heart disease of native coronary artery with unspecified angina pectoris: Secondary | ICD-10-CM

## 2016-10-25 ENCOUNTER — Encounter (INDEPENDENT_AMBULATORY_CARE_PROVIDER_SITE_OTHER): Payer: Self-pay | Admitting: Internal Medicine

## 2016-10-25 ENCOUNTER — Ambulatory Visit (INDEPENDENT_AMBULATORY_CARE_PROVIDER_SITE_OTHER): Payer: Medicare Other | Admitting: Internal Medicine

## 2016-10-25 ENCOUNTER — Encounter (INDEPENDENT_AMBULATORY_CARE_PROVIDER_SITE_OTHER): Payer: Self-pay | Admitting: *Deleted

## 2016-10-25 VITALS — BP 112/66 | HR 72 | Temp 97.6°F | Resp 18 | Ht 63.0 in | Wt 201.6 lb

## 2016-10-25 DIAGNOSIS — I8500 Esophageal varices without bleeding: Secondary | ICD-10-CM

## 2016-10-25 DIAGNOSIS — K7460 Unspecified cirrhosis of liver: Secondary | ICD-10-CM | POA: Diagnosis not present

## 2016-10-25 DIAGNOSIS — I482 Chronic atrial fibrillation: Secondary | ICD-10-CM | POA: Diagnosis not present

## 2016-10-25 DIAGNOSIS — D696 Thrombocytopenia, unspecified: Secondary | ICD-10-CM

## 2016-10-25 DIAGNOSIS — I200 Unstable angina: Secondary | ICD-10-CM

## 2016-10-25 DIAGNOSIS — K219 Gastro-esophageal reflux disease without esophagitis: Secondary | ICD-10-CM

## 2016-10-25 DIAGNOSIS — I25110 Atherosclerotic heart disease of native coronary artery with unstable angina pectoris: Secondary | ICD-10-CM | POA: Diagnosis not present

## 2016-10-25 DIAGNOSIS — K210 Gastro-esophageal reflux disease with esophagitis: Secondary | ICD-10-CM | POA: Diagnosis not present

## 2016-10-25 DIAGNOSIS — J449 Chronic obstructive pulmonary disease, unspecified: Secondary | ICD-10-CM | POA: Diagnosis not present

## 2016-10-25 LAB — PROTIME-INR
INR: 1.2 — ABNORMAL HIGH
Prothrombin Time: 12.6 s — ABNORMAL HIGH (ref 9.0–11.5)

## 2016-10-25 NOTE — Progress Notes (Signed)
Presenting complaint;  Follow-up for chronic liver disease.  Subjective:  Patient is 66 year old Caucasian female who has cirrhosis secondary to NASH complicated by esophageal varices and thrombocytopenia who is here for scheduled visit. She was last seen 6 months ago. She does not have GI complaints. She says she was on Nexium with excellent control of her heartburn insurance would not cover. She was therefore switched to pantoprazole but once daily dose did not work and she is on twice daily. She has intermittent cough. She states she is seeing Dr. Luan Pulling. Dr. Luan Pulling feels her cough is related to her CHF. She denies abdominal pain melena or frank bleeding. She has intermittent hematochezia and she had an episode 3 days ago. She she was diagnosed with atrial fibrillation 3 months ago. She was hospitalized for for 2 days for CHF at [facility]. She states the heart raise on well controlled with therapy. She she is being considered for ablative therapy and she has been told that she will require anticoagulant for several months. This treatment is on hold because of underlying liver disease and portal hypertension.    Current Medications: Outpatient Encounter Prescriptions as of 10/25/2016  Medication Sig  . albuterol (PROVENTIL HFA;VENTOLIN HFA) 108 (90 BASE) MCG/ACT inhaler Inhale 1-2 puffs into the lungs every 6 (six) hours as needed for wheezing or shortness of breath.  [name] aspirin (ASPIRIN EC) 81 MG EC tablet Take 81 mg by mouth daily. Swallow whole.  [name] atorvastatin (LIPITOR) 20 MG tablet Take 20 mg by mouth daily at 6 PM.   . carvedilol (COREG) 6.25 MG tablet Take 1 tablet (6.25 mg total) by mouth 2 (two) times daily with a meal.  . cetirizine (ZYRTEC) 10 MG tablet Take 10 mg by mouth daily as needed for allergies.   . clonazePAM (KLONOPIN) 0.5 MG tablet Take 0.5 mg by mouth daily as needed (sleep).   . ezetimibe (ZETIA) 10 MG tablet Take 10 mg by mouth at bedtime.   [name] FLUoxetine (PROZAC) 40 MG  capsule Take 40 mg by mouth every other day.   . fluticasone (FLONASE) 50 MCG/ACT nasal spray Place 2 sprays into both nostrils daily as needed for allergies.  . furosemide (LASIX) 40 MG tablet TAKE 2 TABLETS BY MOUTH ONCE DAILY TAKE  1  EXTRA  TABLET  DAILY  FOR  WEIGHT  GAIN  OF  2  POUNDS  OVERNIGHT  . HYDROcodone-acetaminophen (NORCO) 10-325 MG tablet Take 1 tablet by mouth every 6 (six) hours as needed (for cough also).  . isosorbide mononitrate (IMDUR) 60 MG 24 hr tablet TAKE ONE TABLET BY MOUTH ONCE DAILY  . NITROSTAT 0.4 MG SL tablet Place 0.4 mg under the tongue every 5 (five) minutes as needed for chest pain (x 3 tabs daily).   . pantoprazole (PROTONIX) 40 MG tablet Take 1 tablet (40 mg total) by mouth 2 (two) times daily.  . potassium chloride (K-DUR) 10 MEQ tablet Take 1 tablet (10 mEq total) by mouth daily.  [name] spironolactone (ALDACTONE) 25 MG tablet Take 1 tablet (25 mg total) by mouth daily.  . valsartan (DIOVAN) 160 MG tablet Take 80-160 mg by mouth See admin instructions. Patient states that she takes 160 mg in the morning , and 80 mg at night.  . [DISCONTINUED] levofloxacin (LEVAQUIN) 750 MG tablet Take 750 mg by mouth daily.   No facility-administered encounter medications on file as of 10/25/2016.      Objective: Blood pressure 112/66, pulse 100, temperature 97.6 F (36.4 C), temperature  source Oral, resp. rate 18, height 5' 3"  (1.6 m), weight 201 lb 9.6 oz (91.4 kg), last menstrual period 03/29/2011. Patient is alert and in no acute distress. Patient does not have asterixis. Conjunctiva is pink. Sclera is nonicteric Oropharyngeal mucosa is normal. No neck masses or thyromegaly noted. Cardiac exam with irregular rhythm normal S1 and S2. No murmur or gallop noted. Lungs are clear to auscultation. Abdomen is full. It is soft and nontender. Spleen is easily palpable. Liver edge is not. No LE edema or clubbing noted.  Labs/studies Results:  Lab data from 08/16/2016 CBC  3.0, H&H 10.6 and 35.2 and platelet count 50 K. AFP was 2.8 on 07/02/2016.    Assessment:  #1.Cirrhosis secondary to NASH complicated by esophageal varices which have been banded previously for primary prophylaxis. No banding needed on last EGD of one year ago. Patient is now considered for anticoagulant therapy by her cardiologist Dr. Burt Knack. Before she goes on anti-coagulant need to reassess and treat esophageal varices in order to minimize the risk of bleeding. She is due for [facility] screening.  #2. History of esophageal varices. She underwent banding initially and May 2014. Varices were too small to be banded on EGD 1 year ago. She is due for reevaluation as above.  #3. GERD. She is requiring double dose PPI for symptom control. Dose reduction in the past has not worked.  #4. Thrombocytopenia. Platelet count 2 months ago was 50 K. 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  #5. New doses of atrial fibrillation. Recent evaluation reveals decrease in EF. She has history of esophageal varices but has never bled. Esophageal varices need to be obliterated before anticoagulation considered. Given her condition benefit from use of anticoagulation may outweigh the risk.  Plan:  Patient will go the lab for INR and AFP. Schedule upper abdominal ultrasound. Esophagogastroduodenoscopy would be scheduled in October 2018 after she returns from her trip. Office visit in 6 months.

## 2016-10-25 NOTE — Patient Instructions (Addendum)
Physician will call with results of blood tests and ultrasound when completed. Will schedule EGD and possible esophageal variceal banding after above completed.

## 2016-10-26 ENCOUNTER — Other Ambulatory Visit (INDEPENDENT_AMBULATORY_CARE_PROVIDER_SITE_OTHER): Payer: Self-pay | Admitting: *Deleted

## 2016-10-26 DIAGNOSIS — I8500 Esophageal varices without bleeding: Secondary | ICD-10-CM

## 2016-10-26 DIAGNOSIS — K7469 Other cirrhosis of liver: Secondary | ICD-10-CM | POA: Insufficient documentation

## 2016-10-26 LAB — AFP TUMOR MARKER: AFP-Tumor Marker: 3.8 ng/mL (ref <6.1)

## 2016-11-01 ENCOUNTER — Ambulatory Visit (HOSPITAL_COMMUNITY): Admission: RE | Admit: 2016-11-01 | Payer: Medicare Other | Source: Ambulatory Visit

## 2016-11-21 DIAGNOSIS — I10 Essential (primary) hypertension: Secondary | ICD-10-CM | POA: Diagnosis not present

## 2016-11-21 DIAGNOSIS — F418 Other specified anxiety disorders: Secondary | ICD-10-CM | POA: Diagnosis not present

## 2016-11-21 DIAGNOSIS — E1140 Type 2 diabetes mellitus with diabetic neuropathy, unspecified: Secondary | ICD-10-CM | POA: Diagnosis not present

## 2016-11-21 DIAGNOSIS — E7800 Pure hypercholesterolemia, unspecified: Secondary | ICD-10-CM | POA: Diagnosis not present

## 2016-11-21 DIAGNOSIS — E782 Mixed hyperlipidemia: Secondary | ICD-10-CM | POA: Diagnosis not present

## 2016-11-21 DIAGNOSIS — D509 Iron deficiency anemia, unspecified: Secondary | ICD-10-CM | POA: Diagnosis not present

## 2016-11-23 DIAGNOSIS — Z23 Encounter for immunization: Secondary | ICD-10-CM | POA: Diagnosis not present

## 2016-11-23 DIAGNOSIS — Z1389 Encounter for screening for other disorder: Secondary | ICD-10-CM | POA: Diagnosis not present

## 2016-11-23 DIAGNOSIS — D72819 Decreased white blood cell count, unspecified: Secondary | ICD-10-CM | POA: Diagnosis not present

## 2016-11-23 DIAGNOSIS — K7460 Unspecified cirrhosis of liver: Secondary | ICD-10-CM | POA: Diagnosis not present

## 2016-11-23 DIAGNOSIS — I25119 Atherosclerotic heart disease of native coronary artery with unspecified angina pectoris: Secondary | ICD-10-CM | POA: Diagnosis not present

## 2016-11-23 DIAGNOSIS — I5023 Acute on chronic systolic (congestive) heart failure: Secondary | ICD-10-CM | POA: Diagnosis not present

## 2016-11-23 DIAGNOSIS — I10 Essential (primary) hypertension: Secondary | ICD-10-CM | POA: Diagnosis not present

## 2016-11-23 DIAGNOSIS — E119 Type 2 diabetes mellitus without complications: Secondary | ICD-10-CM | POA: Diagnosis not present

## 2016-12-01 DIAGNOSIS — Z951 Presence of aortocoronary bypass graft: Secondary | ICD-10-CM | POA: Diagnosis not present

## 2016-12-01 DIAGNOSIS — Z880 Allergy status to penicillin: Secondary | ICD-10-CM | POA: Diagnosis not present

## 2016-12-01 DIAGNOSIS — E7800 Pure hypercholesterolemia, unspecified: Secondary | ICD-10-CM | POA: Diagnosis not present

## 2016-12-01 DIAGNOSIS — Z7982 Long term (current) use of aspirin: Secondary | ICD-10-CM | POA: Diagnosis not present

## 2016-12-01 DIAGNOSIS — Z888 Allergy status to other drugs, medicaments and biological substances status: Secondary | ICD-10-CM | POA: Diagnosis not present

## 2016-12-01 DIAGNOSIS — Z79899 Other long term (current) drug therapy: Secondary | ICD-10-CM | POA: Diagnosis not present

## 2016-12-01 DIAGNOSIS — Z7951 Long term (current) use of inhaled steroids: Secondary | ICD-10-CM | POA: Diagnosis not present

## 2016-12-01 DIAGNOSIS — F329 Major depressive disorder, single episode, unspecified: Secondary | ICD-10-CM | POA: Diagnosis not present

## 2016-12-01 DIAGNOSIS — I110 Hypertensive heart disease with heart failure: Secondary | ICD-10-CM | POA: Diagnosis not present

## 2016-12-01 DIAGNOSIS — M25531 Pain in right wrist: Secondary | ICD-10-CM | POA: Diagnosis not present

## 2016-12-01 DIAGNOSIS — I509 Heart failure, unspecified: Secondary | ICD-10-CM | POA: Diagnosis not present

## 2016-12-01 DIAGNOSIS — S60211A Contusion of right wrist, initial encounter: Secondary | ICD-10-CM | POA: Diagnosis not present

## 2016-12-01 DIAGNOSIS — K7460 Unspecified cirrhosis of liver: Secondary | ICD-10-CM | POA: Diagnosis not present

## 2016-12-07 ENCOUNTER — Ambulatory Visit (INDEPENDENT_AMBULATORY_CARE_PROVIDER_SITE_OTHER): Payer: Medicare Other | Admitting: Orthopaedic Surgery

## 2016-12-07 ENCOUNTER — Encounter (INDEPENDENT_AMBULATORY_CARE_PROVIDER_SITE_OTHER): Payer: Self-pay | Admitting: Orthopaedic Surgery

## 2016-12-07 ENCOUNTER — Ambulatory Visit (INDEPENDENT_AMBULATORY_CARE_PROVIDER_SITE_OTHER): Payer: Medicare Other

## 2016-12-07 VITALS — BP 125/74 | HR 70 | Resp 14 | Ht 63.0 in | Wt 185.0 lb

## 2016-12-07 DIAGNOSIS — M25531 Pain in right wrist: Secondary | ICD-10-CM | POA: Diagnosis not present

## 2016-12-07 DIAGNOSIS — I200 Unstable angina: Secondary | ICD-10-CM

## 2016-12-07 NOTE — Progress Notes (Signed)
Office Visit Note   Patient: Samantha [name]           Date of Birth: 1950-04-14           MRN: [id number] Visit Date: 12/07/2016              Requested by: Samantha [name] [place], [place] [address] PCP: Samantha Hilding, [name]   Assessment & Plan: Visit Diagnoses:  1. Pain in right wrist     Plan: Nondisplaced transverse distal radius fracture (metaphyseal) We'll place in a longer volar wrist splint and see her back in 2 weeks for repeat films Follow-Up Instructions: Return in about 2 weeks (around 12/21/2016).   Orders:  Orders Placed This Encounter  Procedures  . XR Wrist Complete Right   No orders of the defined types were placed in this encounter.     Procedures: No procedures performed   Clinical Data: No additional findings.   Subjective: Chief Complaint  Patient presents with  . Right Hand - Pain    Samantha [name] was at the beach and slipped in the water.   Samantha [name] slipped in the water at [facility] on 12/01/2016. She felt a little bit "dizzy" and fell breaking her fall with her right upper extremity. She was seen at a local emergency room with negative films of her wrist and was placed in a volar wrist splint. She notes that she still having firm on a pain without numbness or tingling in her fingers.  Repeat films today demonstrate nondisplaced transverse fracture of the distal radius HPI  Review of Systems  Constitutional: Negative for chills, fatigue and fever.  Eyes: Negative for itching.  Respiratory: Negative for chest tightness and shortness of breath.   Cardiovascular: Negative for chest pain, palpitations and leg swelling.  Gastrointestinal: Negative for blood in stool, constipation and diarrhea.  Endocrine: Negative for polyuria.  Genitourinary: Negative for dysuria.  Musculoskeletal: Negative for back pain, joint swelling, neck pain and neck stiffness.  Allergic/Immunologic: Negative for immunocompromised state.  Neurological:  Positive for dizziness. Negative for numbness.  Hematological: Does not bruise/bleed easily.  Psychiatric/Behavioral: The patient is not nervous/anxious.      Objective: Vital Signs: BP 125/74   Pulse 70   Resp 14   Ht 5' 3"  (1.6 m)   Wt 185 lb (83.9 kg)   LMP 03/29/2011   BMI 32.77 kg/m   Physical Exam  Ortho Exam awake alert and oriented 3. Comfortable sitting. Volar wrist splint removed from right wrist. Diffuse tenderness over the distal radius. Mild swelling of the fingers. Neurovascular exam intact. No deformity. Specialty Comments:  No specialty comments available.  Imaging: Xr Wrist Complete Right  Result Date: 12/07/2016 Terms of the right wrist obtained in 3 projections. There is evidence of a nondisplaced transverse fracture of the distal radial metaphysis. No displacement. No intra-articular extension    PMFS History: Patient Active Problem List   Diagnosis Date Noted  . Other cirrhosis of liver ([place]) 10/26/2016  . Idiopathic esophageal varices without bleeding ([place]) 10/26/2016  . Acute systolic heart failure ([place]) 08/16/2016  . Persistent atrial fibrillation ([place]) 08/16/2016  . Unstable angina pectoris ([place])   . ACS (acute coronary syndrome) ([place]) 08/14/2016  . Cardiomyopathy, ischemic-35-40% by cath  01/11/2015  . Acute on chronic combined systolic and diastolic heart failure ([place]) 01/11/2015  . Unstable angina- secondary to CHF, Troponin negative 01/08/2015  . Obesity 09/24/2013  . Unspecified hereditary and idiopathic peripheral neuropathy  01/10/2013  . Cirrhosis, non-alcoholic ([place]) 62/13/0865  . Chronic combined systolic and diastolic CHF, NYHA class 2 ([place]) 01/16/2012  . CHF (congestive heart failure) ([place]) 12/23/2011  . Esophageal varices- Plavix stopped 12/23/2011  . GI bleed 12/23/2011  . Anemia 04/22/2011  . CAD S/P prior CFX PCI with DES 08/21/2009  . Mixed hyperlipidemia 06/04/2008  . Essential hypertension 06/04/2008  . S/P CABG x 3-  2001 06/04/2008   Past Medical History:  Diagnosis Date  . CHF (congestive heart failure) ([place])   . Cirrhosis of liver ([place])   . Coronary artery disease    Status post percutaneous coronary intervention and bare metal stenting  of the left circumflex and subsequent percutaneous transluminal coronary anioplasty and placement of a 3.0 NIR bare metal stent proximal to the previous stent.  . Depression   . Esophageal varices ([place])    New 2013  . Gastroesophageal reflux disease   . H/O: hysterectomy   . History of pneumonia   . Hyperlipidemia   . Hypertension   . Obesity   . Osteoarthritis   . Persistent atrial fibrillation ([place])   . Rapid palpitations   . Status post hysterectomy   . Thrombocytopenia ([place])     Family History  Problem Relation Age of Onset  . Heart attack Mother   . Heart attack Father 78       cause of death  . Coronary artery disease Sister        CABG   . Colon cancer Neg Hx     Past Surgical History:  Procedure Laterality Date  . ABDOMINAL HYSTERECTOMY    . BACK SURGERY    . CARDIAC CATHETERIZATION  2004   left internal mammary artery to the obtuse marginal  was found to be small and thread like.  The two grafts were patent.  The left circumflex had 90% in-stent restenosis and cutting balloon angioplasty was performed followed by placement of a 3.0 x 87m Taxus drug -eluting stent.    .Marland KitchenCARDIAC CATHETERIZATION  2006   There was in-stent restenosis in the left circumflex and this was treated with cutting balloon angioplasty   . CARDIAC CATHETERIZATION  2008   vein graft to the to the obtuse marginal was patent, although small, left circumflex had 40% in-stent restenosis, ejection fraction 40-45%.  The patient was medically mananged.  .Marland KitchenCARDIAC CATHETERIZATION N/A 01/09/2015   Procedure: Left Heart Cath and Cors/Grafts Angiography;  Surgeon: HBelva Crome [name];  [facility];  Service: Cardiovascular;  Laterality: N/A;  . COLONOSCOPY  03/29/2011    Procedure: COLONOSCOPY;  Surgeon: MJamesetta So [name];  [facility];  Service: Gastroenterology;  Laterality: N/A;  . CORONARY ARTERY BYPASS GRAFT  May 31,2001   x 3 with a vein graft to the first diagonal, vein graft to the right coronary  artery, and a free left internal mammary  artery to the obtuse marginal   . ESOPHAGEAL BANDING N/A 07/04/2012   Procedure: ESOPHAGEAL BANDING;  Surgeon: NRogene Houston [name];  [facility];  Service: Endoscopy;  Laterality: N/A;  . ESOPHAGEAL BANDING N/A 09/17/2012   Procedure: ESOPHAGEAL BANDING;  Surgeon: NRogene Houston [name];  [facility];  Service: Endoscopy;  Laterality: N/A;  . ESOPHAGEAL BANDING N/A 10/22/2013   Procedure: ESOPHAGEAL BANDING;  Surgeon: NRogene Houston [name];  [facility];  Service: Endoscopy;  Laterality: N/A;  . ESOPHAGEAL BANDING N/A 11/28/2014   Procedure: ESOPHAGEAL BANDING;  Surgeon: Rogene Houston, [name];  [facility];  Service: Endoscopy;  Laterality: N/A;  . ESOPHAGEAL BANDING N/A 10/29/2015   Procedure: ESOPHAGEAL BANDING;  Surgeon: Rogene Houston, [name];  [facility];  Service: Endoscopy;  Laterality: N/A;  . ESOPHAGOGASTRODUODENOSCOPY  12/20/2011   Procedure: ESOPHAGOGASTRODUODENOSCOPY (EGD);  Surgeon: Jamesetta So, [name];  [facility];  Service: Gastroenterology;  Laterality: N/A;  . ESOPHAGOGASTRODUODENOSCOPY N/A 07/04/2012   Procedure: ESOPHAGOGASTRODUODENOSCOPY (EGD);  Surgeon: Rogene Houston, [name];  [facility];  Service: Endoscopy;  Laterality: N/A;  235-moved to 255 Ann to notify pt  . ESOPHAGOGASTRODUODENOSCOPY N/A 09/17/2012   Procedure: ESOPHAGOGASTRODUODENOSCOPY (EGD);  Surgeon: Rogene Houston, [name];  [facility];  Service: Endoscopy;  Laterality: N/A;  730  . ESOPHAGOGASTRODUODENOSCOPY N/A 10/22/2013   Procedure: ESOPHAGOGASTRODUODENOSCOPY (EGD);  Surgeon: Rogene Houston, [name];  [facility];  Service: Endoscopy;   Laterality: N/A;  730  . ESOPHAGOGASTRODUODENOSCOPY N/A 11/28/2014   Procedure: ESOPHAGOGASTRODUODENOSCOPY (EGD);  Surgeon: Rogene Houston, [name];  [facility];  Service: Endoscopy;  Laterality: N/A;  1:25  . ESOPHAGOGASTRODUODENOSCOPY N/A 10/29/2015   Procedure: ESOPHAGOGASTRODUODENOSCOPY (EGD);  Surgeon: Rogene Houston, [name];  [facility];  Service: Endoscopy;  Laterality: N/A;  12:00  . JOINT REPLACEMENT    . LEFT HEART CATH AND CORS/GRAFTS ANGIOGRAPHY N/A 08/15/2016   Procedure: Left Heart Cath and Cors/Grafts Angiography;  Surgeon: Jettie Booze, [name];  Location: [facility];  Service: Cardiovascular;  Laterality: N/A;  . LEFT HEART CATHETERIZATION WITH CORONARY/GRAFT ANGIOGRAM N/A 12/25/2013   Procedure: LEFT HEART CATHETERIZATION WITH Beatrix Fetters;  Surgeon: Blane Ohara, [name];  Location: [facility] CATH LAB;  Service: Cardiovascular;  Laterality: N/A;  . rotator cuff left  2007  . TONSILLECTOMY     Social History   Occupational History  . Disabled    Social History Main Topics  . Smoking status: Former Smoker    Packs/day: 0.50    Years: 20.00    Types: Cigarettes    Quit date: 07/21/1995  . Smokeless tobacco: Never Used  . Alcohol use No  . Drug use: No  . Sexual activity: Not on file

## 2016-12-12 ENCOUNTER — Ambulatory Visit (HOSPITAL_COMMUNITY)
Admission: RE | Admit: 2016-12-12 | Discharge: 2016-12-12 | Disposition: A | Discharge status: Home / Self Care | Payer: Medicare Other | Source: Ambulatory Visit | Attending: Internal Medicine | Admitting: Internal Medicine

## 2016-12-12 ENCOUNTER — Encounter (HOSPITAL_COMMUNITY)
Admission: RE | Disposition: A | Discharge status: Home / Self Care | Payer: Self-pay | Source: Ambulatory Visit | Attending: Internal Medicine

## 2016-12-12 ENCOUNTER — Encounter (HOSPITAL_COMMUNITY): Payer: Self-pay | Admitting: *Deleted

## 2016-12-12 DIAGNOSIS — I864 Gastric varices: Secondary | ICD-10-CM | POA: Insufficient documentation

## 2016-12-12 DIAGNOSIS — K7469 Other cirrhosis of liver: Secondary | ICD-10-CM

## 2016-12-12 DIAGNOSIS — I481 Persistent atrial fibrillation: Secondary | ICD-10-CM | POA: Diagnosis not present

## 2016-12-12 DIAGNOSIS — Z90710 Acquired absence of both cervix and uterus: Secondary | ICD-10-CM | POA: Diagnosis not present

## 2016-12-12 DIAGNOSIS — Z87891 Personal history of nicotine dependence: Secondary | ICD-10-CM | POA: Diagnosis not present

## 2016-12-12 DIAGNOSIS — E669 Obesity, unspecified: Secondary | ICD-10-CM | POA: Diagnosis not present

## 2016-12-12 DIAGNOSIS — K3189 Other diseases of stomach and duodenum: Secondary | ICD-10-CM | POA: Insufficient documentation

## 2016-12-12 DIAGNOSIS — Z888 Allergy status to other drugs, medicaments and biological substances status: Secondary | ICD-10-CM | POA: Insufficient documentation

## 2016-12-12 DIAGNOSIS — K219 Gastro-esophageal reflux disease without esophagitis: Secondary | ICD-10-CM | POA: Diagnosis not present

## 2016-12-12 DIAGNOSIS — Z91048 Other nonmedicinal substance allergy status: Secondary | ICD-10-CM | POA: Insufficient documentation

## 2016-12-12 DIAGNOSIS — Z6832 Body mass index (BMI) 32.0-32.9, adult: Secondary | ICD-10-CM | POA: Diagnosis not present

## 2016-12-12 DIAGNOSIS — E785 Hyperlipidemia, unspecified: Secondary | ICD-10-CM | POA: Insufficient documentation

## 2016-12-12 DIAGNOSIS — K766 Portal hypertension: Secondary | ICD-10-CM | POA: Insufficient documentation

## 2016-12-12 DIAGNOSIS — I2510 Atherosclerotic heart disease of native coronary artery without angina pectoris: Secondary | ICD-10-CM | POA: Diagnosis not present

## 2016-12-12 DIAGNOSIS — Z951 Presence of aortocoronary bypass graft: Secondary | ICD-10-CM | POA: Diagnosis not present

## 2016-12-12 DIAGNOSIS — I8510 Secondary esophageal varices without bleeding: Secondary | ICD-10-CM | POA: Diagnosis not present

## 2016-12-12 DIAGNOSIS — K7460 Unspecified cirrhosis of liver: Secondary | ICD-10-CM | POA: Diagnosis not present

## 2016-12-12 DIAGNOSIS — F329 Major depressive disorder, single episode, unspecified: Secondary | ICD-10-CM | POA: Diagnosis not present

## 2016-12-12 DIAGNOSIS — M1990 Unspecified osteoarthritis, unspecified site: Secondary | ICD-10-CM | POA: Diagnosis not present

## 2016-12-12 DIAGNOSIS — Z8249 Family history of ischemic heart disease and other diseases of the circulatory system: Secondary | ICD-10-CM | POA: Insufficient documentation

## 2016-12-12 DIAGNOSIS — R002 Palpitations: Secondary | ICD-10-CM | POA: Insufficient documentation

## 2016-12-12 DIAGNOSIS — D696 Thrombocytopenia, unspecified: Secondary | ICD-10-CM | POA: Diagnosis not present

## 2016-12-12 DIAGNOSIS — Z7982 Long term (current) use of aspirin: Secondary | ICD-10-CM | POA: Insufficient documentation

## 2016-12-12 DIAGNOSIS — Z880 Allergy status to penicillin: Secondary | ICD-10-CM | POA: Insufficient documentation

## 2016-12-12 DIAGNOSIS — I509 Heart failure, unspecified: Secondary | ICD-10-CM | POA: Insufficient documentation

## 2016-12-12 DIAGNOSIS — Z886 Allergy status to analgesic agent status: Secondary | ICD-10-CM | POA: Insufficient documentation

## 2016-12-12 DIAGNOSIS — K228 Other specified diseases of esophagus: Secondary | ICD-10-CM

## 2016-12-12 DIAGNOSIS — I110 Hypertensive heart disease with heart failure: Secondary | ICD-10-CM | POA: Insufficient documentation

## 2016-12-12 DIAGNOSIS — I8500 Esophageal varices without bleeding: Secondary | ICD-10-CM

## 2016-12-12 DIAGNOSIS — Z885 Allergy status to narcotic agent status: Secondary | ICD-10-CM | POA: Insufficient documentation

## 2016-12-12 HISTORY — PX: ESOPHAGOGASTRODUODENOSCOPY: SHX5428

## 2016-12-12 SURGERY — EGD (ESOPHAGOGASTRODUODENOSCOPY)
Anesthesia: Moderate Sedation

## 2016-12-12 MED ORDER — SODIUM CHLORIDE 0.9 % IV SOLN
INTRAVENOUS | Status: DC
  Administered 2016-12-12: 10:00:00 via INTRAVENOUS

## 2016-12-12 MED ORDER — LIDOCAINE VISCOUS 2 % MT SOLN
OROMUCOSAL | Status: DC | PRN
  Administered 2016-12-12: 1 via OROMUCOSAL

## 2016-12-12 MED ORDER — LIDOCAINE VISCOUS 2 % MT SOLN
OROMUCOSAL | Status: AC
  Filled 2016-12-12: qty 15

## 2016-12-12 MED ORDER — MEPERIDINE HCL 50 MG/ML IJ SOLN
INTRAMUSCULAR | Status: DC | PRN
  Administered 2016-12-12 (×2): 25 mg

## 2016-12-12 MED ORDER — MIDAZOLAM HCL 5 MG/5ML IJ SOLN
INTRAMUSCULAR | Status: AC
  Filled 2016-12-12: qty 10

## 2016-12-12 MED ORDER — MEPERIDINE HCL 50 MG/ML IJ SOLN
INTRAMUSCULAR | Status: AC
  Filled 2016-12-12: qty 1

## 2016-12-12 MED ORDER — MIDAZOLAM HCL 5 MG/5ML IJ SOLN
INTRAMUSCULAR | Status: DC | PRN
  Administered 2016-12-12 (×4): 2 mg via INTRAVENOUS

## 2016-12-12 NOTE — Discharge Instructions (Signed)
Resume usuall medications including aspirin as before. Resume usual diet. No driving for 24 hours.  Esophagogastroduodenoscopy, Care After Refer to this sheet in the next few weeks. These instructions provide you with information about caring for yourself after your procedure. Your health care provider may also give you more specific instructions. Your treatment has been planned according to current medical practices, but problems sometimes occur. Call your health care provider if you have any problems or questions after your procedure. What can I expect after the procedure? After the procedure, it is common to have:  A sore throat.  Nausea.  Bloating.  Dizziness.  Fatigue.  Follow these instructions at home:  Do not eat or drink anything until the numbing medicine (local anesthetic) has worn off and your gag reflex has returned. You will know that the local anesthetic has worn off when you can swallow comfortably.  Do not drive for 24 hours if you received a medicine to help you relax (sedative).  If your health care provider took a tissue sample for testing during the procedure, make sure to get your test results. This is your responsibility. Ask your health care provider or the department performing the test when your results will be ready.  Keep all follow-up visits as told by your health care provider. This is important. Contact a health care provider if:  You cannot stop coughing.  You are not urinating.  You are urinating less than usual. Get help right away if:  You have trouble swallowing.  You cannot eat or drink.  You have throat or chest pain that gets worse.  You are dizzy or light-headed.  You faint.  You have nausea or vomiting.  You have chills.  You have a fever.  You have severe abdominal pain.  You have black, tarry, or bloody stools. This information is not intended to replace advice given to you by your health care provider. Make sure you  discuss any questions you have with your health care provider. Document Released: 02/01/2012 Document Revised: 07/23/2015 Document Reviewed: 01/08/2015 Elsevier Interactive Patient Education  [employer].

## 2016-12-12 NOTE — Op Note (Signed)
[facility] Patient Name: Samantha [name] Procedure Date: 12/12/2016 10:20 AM MRN: [id number] Date of Birth: 02-Aug-1950 Attending MD: Hildred Laser , MD CSN: [id number] Age: 66 Admit Type: Outpatient Procedure:                Upper GI endoscopy Indications:              Follow-up of esophageal varices, For therapy of                            esophageal varices Providers:                Hildred Laser, MD, Selena Lesser, Lurline Del, RN Referring MD:             [name] MD, MD Medicines:                Lidocaine spray, Meperidine 50 mg IV, Midazolam 8                            mg IV Complications:            No immediate complications. Estimated Blood Loss:     Estimated blood loss: none. Procedure:                Pre-Anesthesia Assessment:                           - Prior to the procedure, a History and Physical                            was performed, and patient medications and                            allergies were reviewed. The patient's tolerance of                            previous anesthesia was also reviewed. The risks                            and benefits of the procedure and the sedation                            options and risks were discussed with the patient.                            All questions were answered, and informed consent                            was obtained. Prior Anticoagulants: The patient                            last took aspirin 4 days prior to the procedure.                            ASA Grade Assessment: III - A patient with severe  systemic disease. After reviewing the risks and                            benefits, the patient was deemed in satisfactory                            condition to undergo the procedure.                           After obtaining informed consent, the endoscope was                            passed under direct vision. Throughout the   procedure, the patient's blood pressure, pulse, and                            oxygen saturations were monitored continuously. The                            [id number]-[id number] ([id number]) scope was introduced through the                            mouth, and advanced to the second part of duodenum.                            The upper GI endoscopy was accomplished without                            difficulty. The patient tolerated the procedure                            well. Scope In: 10:43:00 AM Scope Out: 10:47:54 AM Total Procedure Duration: 0 hours 4 minutes 54 seconds  Findings:      The proximal esophagus and mid esophagus were normal.      Grade I, grade II varices were found in the lower third of the esophagus.      A post variceal banding scar was found in the distal esophagus.      The Z-line was regular and was found 38 cm from the incisors.      Mild portal hypertensive gastropathy was found in the entire examined       stomach.      Type 1 gastroesophageal varices (GOV1, esophageal varices which extend       along the lesser curvature) with no bleeding were found in the gastric       fundus.      The duodenal bulb and second portion of the duodenum were normal. Impression:               - Normal proximal esophagus and mid esophagus.                           - Grade I and grade II esophageal varices. varices                            were t large enough to be bandeVarices not large  enough to be banded.                           - Scar in the distal esophagus.                           - Z-line regular, 38 cm from the incisors.                           - Portal hypertensive gastropathy.                           - Type 1 gastroesophageal varices (GOV1, esophageal                            varices which extend along the lesser curvature),                            without bleeding.                           - Normal duodenal bulb and second portion of  the                            duodenum.                           - No specimens collected. Moderate Sedation:      Moderate (conscious) sedation was administered by the endoscopy nurse       and supervised by the endoscopist. The following parameters were       monitored: oxygen saturation, heart rate, blood pressure, CO2       capnography and response to care. Total physician intraservice time was       13 minutes. Recommendation:           - Patient has a contact number available for                            emergencies. The signs and symptoms of potential                            delayed complications were discussed with the                            patient. Return to normal activities tomorrow.                            Written discharge instructions were provided to the                            patient.                           - Resume previous diet today.                           - Continue present  medications.                           - Repeat upper endoscopy PRN. Procedure Code(s):        --- Professional ---                           [id number], Esophagogastroduodenoscopy, flexible,                            transoral; diagnostic, including collection of                            specimen(s) by brushing or washing, when performed                            (separate procedure)                           99152, Moderate sedation services provided by the                            same physician or other qualified health care                            professional performing the diagnostic or                            therapeutic service that the sedation supports,                            requiring the presence of an independent trained                            observer to assist in the monitoring of the                            patient's level of consciousness and physiological                            status; initial 15 minutes of intraservice time,                             patient age 35 years or older Diagnosis Code(s):        --- Professional ---                           I85.00, Esophageal varices without bleeding                           K22.8, Other specified diseases of esophagus                           K76.6, Portal hypertension  K31.89, Other diseases of stomach and duodenum                           I86.4, Gastric varices CPT copyright 2016 [employer]. All rights reserved. The codes documented in this report are preliminary and upon coder review may  be revised to meet current compliance requirements. Hildred Laser, MD Hildred Laser, MD 12/12/2016 11:10:52 AM This report has been signed electronically. Number of Addenda: 0

## 2016-12-12 NOTE — H&P (Signed)
Samantha [name] is an 66 y.o. female.   Chief Complaint: Patient is here for EGD and possible esophageal variceal banding. HPI:  Patient is 66 year old Caucasian female with multiple medical problems including cirrhosis complicated by esophageal varices which have been banded before for primary prophylaxis. She did not require banding on her last EGD of August 2017. She denies abdominal pain melena or rectal bleeding. She says [place] the beach and [place] and broke her right wrist and has a wrist splint in place.  Past Medical History:  Diagnosis Date  . CHF (congestive heart failure) ([place])   . Cirrhosis of liver ([place])   . Coronary artery disease    Status post percutaneous coronary intervention and bare metal stenting  of the left circumflex and subsequent percutaneous transluminal coronary anioplasty and placement of a 3.0 NIR bare metal stent proximal to the previous stent.  . Depression   . Esophageal varices ([place])    New 2013  . Gastroesophageal reflux disease   . H/O: hysterectomy   . History of pneumonia   . Hyperlipidemia   . Hypertension   . Obesity   . Osteoarthritis   . Persistent atrial fibrillation ([place])   . Rapid palpitations   . Status post hysterectomy   . Thrombocytopenia ([place])     Past Surgical History:  Procedure Laterality Date  . ABDOMINAL HYSTERECTOMY    . BACK SURGERY    . CARDIAC CATHETERIZATION  2004   left internal mammary artery to the obtuse marginal  was found to be small and thread like.  The two grafts were patent.  The left circumflex had 90% in-stent restenosis and cutting balloon angioplasty was performed followed by placement of a 3.0 x 82m Taxus drug -eluting stent.    .Marland KitchenCARDIAC CATHETERIZATION  2006   There was in-stent restenosis in the left circumflex and this was treated with cutting balloon angioplasty   . CARDIAC CATHETERIZATION  2008   vein graft to the to the obtuse marginal was patent, although small, left circumflex had 40% in-stent  restenosis, ejection fraction 40-45%.  The patient was medically mananged.  .Marland KitchenCARDIAC CATHETERIZATION N/A 01/09/2015   Procedure: Left Heart Cath and Cors/Grafts Angiography;  Surgeon: HBelva Crome MD;  [facility];  Service: Cardiovascular;  Laterality: N/A;  . COLONOSCOPY  03/29/2011   Procedure: COLONOSCOPY;  Surgeon: MJamesetta So MD;  [facility];  Service: Gastroenterology;  Laterality: N/A;  . CORONARY ARTERY BYPASS GRAFT  May 31,2001   x 3 with a vein graft to the first diagonal, vein graft to the right coronary  artery, and a free left internal mammary  artery to the obtuse marginal   . ESOPHAGEAL BANDING N/A 07/04/2012   Procedure: ESOPHAGEAL BANDING;  Surgeon: NRogene Houston MD;  [facility];  Service: Endoscopy;  Laterality: N/A;  . ESOPHAGEAL BANDING N/A 09/17/2012   Procedure: ESOPHAGEAL BANDING;  Surgeon: NRogene Houston MD;  [facility];  Service: Endoscopy;  Laterality: N/A;  . ESOPHAGEAL BANDING N/A 10/22/2013   Procedure: ESOPHAGEAL BANDING;  Surgeon: NRogene Houston MD;  [facility];  Service: Endoscopy;  Laterality: N/A;  . ESOPHAGEAL BANDING N/A 11/28/2014   Procedure: ESOPHAGEAL BANDING;  Surgeon: NRogene Houston MD;  [facility];  Service: Endoscopy;  Laterality: N/A;  . ESOPHAGEAL BANDING N/A 10/29/2015   Procedure: ESOPHAGEAL BANDING;  Surgeon: NRogene Houston MD;  [facility];  Service: Endoscopy;  Laterality: N/A;  . ESOPHAGOGASTRODUODENOSCOPY  12/20/2011   Procedure: ESOPHAGOGASTRODUODENOSCOPY (EGD);  Surgeon: Jamesetta So, MD;  [facility];  Service: Gastroenterology;  Laterality: N/A;  . ESOPHAGOGASTRODUODENOSCOPY N/A 07/04/2012   Procedure: ESOPHAGOGASTRODUODENOSCOPY (EGD);  Surgeon: Rogene Houston, MD;  [facility];  Service: Endoscopy;  Laterality: N/A;  235-moved to 255 Ann to notify pt  . ESOPHAGOGASTRODUODENOSCOPY N/A 09/17/2012   Procedure:  ESOPHAGOGASTRODUODENOSCOPY (EGD);  Surgeon: Rogene Houston, MD;  [facility];  Service: Endoscopy;  Laterality: N/A;  730  . ESOPHAGOGASTRODUODENOSCOPY N/A 10/22/2013   Procedure: ESOPHAGOGASTRODUODENOSCOPY (EGD);  Surgeon: Rogene Houston, MD;  [facility];  Service: Endoscopy;  Laterality: N/A;  730  . ESOPHAGOGASTRODUODENOSCOPY N/A 11/28/2014   Procedure: ESOPHAGOGASTRODUODENOSCOPY (EGD);  Surgeon: Rogene Houston, MD;  [facility];  Service: Endoscopy;  Laterality: N/A;  1:25  . ESOPHAGOGASTRODUODENOSCOPY N/A 10/29/2015   Procedure: ESOPHAGOGASTRODUODENOSCOPY (EGD);  Surgeon: Rogene Houston, MD;  [facility];  Service: Endoscopy;  Laterality: N/A;  12:00  . JOINT REPLACEMENT    . LEFT HEART CATH AND CORS/GRAFTS ANGIOGRAPHY N/A 08/15/2016   Procedure: Left Heart Cath and Cors/Grafts Angiography;  Surgeon: Jettie Booze, MD;  Location: [facility];  Service: Cardiovascular;  Laterality: N/A;  . LEFT HEART CATHETERIZATION WITH CORONARY/GRAFT ANGIOGRAM N/A 12/25/2013   Procedure: LEFT HEART CATHETERIZATION WITH Beatrix Fetters;  Surgeon: Blane Ohara, MD;  Location[facility];  Service: Cardiovascular;  Laterality: N/A;  . rotator cuff left  2007  . TONSILLECTOMY      Family History  Problem Relation Age of Onset  . Heart attack Mother   . Heart attack Father 55       cause of death  . Coronary artery disease Sister        CABG   . Colon cancer Neg Hx    Social History:  reports that she quit smoking about 21 years ago. Her smoking use included Cigarettes. She has a 10.00 pack-year smoking history. She has never used smokeless tobacco. She reports that she does not drink alcohol or use drugs.  Allergies:  Allergies  Allergen Reactions  . Acetaminophen Other (See Comments)    Liver problems  . Oxycodone Itching and Nausea Only  . Ace Inhibitors Other (See Comments)    UNSURE OF REACTION TYPE  . Cefaclor  Itching and Rash  . Cephalexin Itching and Rash  . Penicillins Rash    Has patient had a PCN reaction causing immediate rash, facial/tongue/throat swelling, SOB or lightheadedness with hypotension: YES Has patient had a PCN reaction causing severe rash involving mucus membranes or skin necrosis: NO Has patient had a PCN reaction that required hospitalization: YES Has patient had a PCN reaction occurring within the last 10 years: NO If all of the above answers are "NO", then may proceed with Cephalosporin use.   . Pregabalin Other (See Comments)    Retains fluid  . Tape Itching and Rash    Takes skin right off with medical tape--PAPER TAPE ONLY    Medications Prior to Admission  Medication Sig Dispense Refill  . albuterol (PROVENTIL HFA;VENTOLIN HFA) 108 (90 BASE) MCG/ACT inhaler Inhale 1-2 puffs into the lungs every 6 (six) hours as needed for wheezing or shortness of breath.    [name] aspirin (ASPIRIN EC) 81 MG EC tablet Take 81 mg by mouth daily. Swallow whole.    [name] atorvastatin (LIPITOR) 20 MG tablet  Take 20 mg by mouth every evening.     . carvedilol (COREG) 6.25 MG tablet Take 1 tablet (6.25 mg total) by mouth 2 (two) times daily with a meal. 180 tablet 3  . cetirizine (ZYRTEC) 10 MG tablet Take 10 mg by mouth daily as needed for allergies.     . clonazePAM (KLONOPIN) 0.5 MG tablet Take 0.5 mg by mouth daily as needed (sleep).     . docusate sodium (COLACE) 100 MG capsule Take 100 mg by mouth 2 (two) times daily.    [name] ezetimibe (ZETIA) 10 MG tablet Take 10 mg by mouth at bedtime.     [name] FLUoxetine (PROZAC) 40 MG capsule Take 40 mg by mouth every other day.     . furosemide (LASIX) 40 MG tablet TAKE 2 TABLETS BY MOUTH ONCE DAILY TAKE  1  EXTRA  TABLET  DAILY  FOR  WEIGHT  GAIN  OF  2  POUNDS  OVERNIGHT 180 tablet 2  . HYDROcodone-acetaminophen (NORCO) 10-325 MG tablet Take 1 tablet by mouth every 6 (six) hours as needed (for pain.).     [name] isosorbide mononitrate (IMDUR) 60 MG 24 hr tablet  TAKE ONE TABLET BY MOUTH ONCE DAILY 30 tablet 7  . losartan (COZAAR) 50 MG tablet Take 50 mg by mouth daily.    [name] NITROSTAT 0.4 MG SL tablet Place 0.4 mg under the tongue every 5 (five) minutes as needed for chest pain (x 3 tabs daily).     . pantoprazole (PROTONIX) 40 MG tablet Take 1 tablet (40 mg total) by mouth 2 (two) times daily. 180 tablet 2  . potassium chloride (K-DUR) 10 MEQ tablet Take 1 tablet (10 mEq total) by mouth daily. 90 tablet 3  . spironolactone (ALDACTONE) 25 MG tablet Take 1 tablet (25 mg total) by mouth daily. 30 tablet 2  . fluticasone (FLONASE) 50 MCG/ACT nasal spray Place 2 sprays into both nostrils daily as needed for allergies.      No results found for this or any previous visit (from the past 48 hour(s)). No results found.  ROS  Blood pressure (!) 120/49, pulse (!) 110, temperature 97.6 F (36.4 C), temperature source Oral, resp. rate 18, height 5' 3"  (1.6 m), weight 185 lb (83.9 kg), last menstrual period 03/29/2011, SpO2 97 %. Physical Exam  Constitutional: She appears well-developed and well-nourished.  HENT:  Mouth/Throat: Oropharynx is clear and moist.  Eyes: Conjunctivae are normal. No scleral icterus.  Neck: No thyromegaly present.  Cardiovascular:  Cardiac exam with irregular rhythm  Normal S1 and S2. No murmur or gallop noted.  Respiratory: Effort normal and breath sounds normal.  GI:  Abdomen is full but soft and nontender without organomegaly or masses.  Musculoskeletal: She exhibits no edema.  She has right wrist splint in place.  Lymphadenopathy:    She has no cervical adenopathy.  Neurological: She is alert.  Skin: Skin is warm and dry.     Assessment/Plan Esophageal varices secondary to cirrhosis. EGD with possible esophageal variceal banding.  Hildred Laser, MD 12/12/2016, 10:27 AM

## 2016-12-14 ENCOUNTER — Encounter (HOSPITAL_COMMUNITY): Payer: Self-pay | Admitting: Internal Medicine

## 2017-01-04 ENCOUNTER — Ambulatory Visit (INDEPENDENT_AMBULATORY_CARE_PROVIDER_SITE_OTHER): Payer: Medicare Other | Admitting: Orthopaedic Surgery

## 2017-01-04 ENCOUNTER — Ambulatory Visit (INDEPENDENT_AMBULATORY_CARE_PROVIDER_SITE_OTHER): Payer: Medicare Other

## 2017-01-04 ENCOUNTER — Encounter (INDEPENDENT_AMBULATORY_CARE_PROVIDER_SITE_OTHER): Payer: Self-pay | Admitting: Orthopaedic Surgery

## 2017-01-04 VITALS — BP 111/70 | HR 110 | Resp 12 | Ht 62.0 in | Wt 185.0 lb

## 2017-01-04 DIAGNOSIS — M25531 Pain in right wrist: Secondary | ICD-10-CM

## 2017-01-04 DIAGNOSIS — M25551 Pain in right hip: Secondary | ICD-10-CM

## 2017-01-04 DIAGNOSIS — I200 Unstable angina: Secondary | ICD-10-CM

## 2017-01-04 NOTE — Progress Notes (Signed)
Office Visit Note   Patient: Samantha [name]           Date of Birth: Mar 14, 1950           MRN: [id number] Visit Date: 01/04/2017              Requested by: Sasser, Silvestre Moment, MD [place], [place] [address] PCP: [name], MD   Assessment & Plan: Visit Diagnoses:  1. Pain in right hip   2. Pain in right wrist     Plan: Films of the right wrist reveal a distal radius fracture be in excellent position. We'll begin range of motion exercises out of the splint and return in 3 weeks. No further films necessary. There is some widening at the radiocarpal joint twin the navicular lunate which may or may not be an acute injury. We'll monitor. Has a chronic problem with the lumbar spine with bilateral hip pain localized in the area of the greater trochanters. Films of the right hip replacement were essentially negative with possible slight wear pattern of the femoral head in the polyethylene component. Samantha [name] has seen Dr. Carloyn Manner in the past for her back. I suggested that she check with his office as she has a chronic back pain in the hip pain may be referred from her back.  Follow-Up Instructions: Return in about 3 weeks (around 01/25/2017).   Orders:  Orders Placed This Encounter  Procedures  . XR Wrist Complete Right  . XR HIP UNILAT W OR W/O PELVIS 2-3 VIEWS LEFT   No orders of the defined types were placed in this encounter.     Procedures: No procedures performed   Clinical Data: No additional findings.   Subjective: Chief Complaint  Patient presents with  . Right Wrist - Pain  . Right Hip - Pain  Just over a month status post nondisplaced right distal radius fracture. She's been using a the volar wrist splint and has been relatively comfortable. Denies any numbness or tingling.  Also has been complaining of some pain along the lateral aspect of both of her hips. She's had a prior right total hip replacement 10:15 years ago. Pain has not been in the groin. Also has  chronic back pain followed by Dr. Carloyn Manner. Not experiencing any numbness or tingling. Having some difficulty sleeping on both the right and left hip related to pain over the greater trochanteric region  Review of Systems  Constitutional: Negative for chills, fatigue and fever.  Eyes: Negative for itching.  Respiratory: Negative for chest tightness and shortness of breath.   Cardiovascular: Negative for chest pain, palpitations and leg swelling.  Gastrointestinal: Negative for blood in stool, constipation and diarrhea.  Endocrine: Negative for polyuria.  Genitourinary: Negative for dysuria.  Musculoskeletal: Positive for back pain. Negative for joint swelling, neck pain and neck stiffness.  Allergic/Immunologic: Negative for immunocompromised state.  Neurological: Negative for dizziness and numbness.  Hematological: Does not bruise/bleed easily.  Psychiatric/Behavioral: Positive for sleep disturbance. The patient is not nervous/anxious.      Objective: Vital Signs: BP 111/70   Pulse (!) 110   Resp 12   Ht 5' 2"  (1.575 m)   Wt 185 lb (83.9 kg)   LMP 03/29/2011   BMI 33.84 kg/m   Physical Exam  Ortho Exam awake alert and oriented 3. Comfortable sitting. Splint was removed from the right upper extremity. Very minimal tenderness over the radius and ulna and even the dorsum of her hand. No  swelling. No ecchymosis or erythema. Able to make a full fist. Some limited range of motion in flexion extension pronation supination based on the fracture and the period of immobilization in the splint. Her vascular exam intact. Straight leg raise negative bilaterally. Painless range of motion of both hips with internal and external rotation. no swelling distally neurovascular exam intact. No percussible tenderness of the lumbar spine. There is tenderness over the greater trochanteric region of both of her hips no obvious abnormalities on plain films about either true trochanter  Specialty Comments:  No  specialty comments available.  Imaging: Xr Hip Unilat W Or W/o Pelvis 2-3 Views Left  Result Date: 01/04/2017 AP the pelvis and lateral of the right hip are obtained. The right total hip replacement appears to be in excellent position. There may be very minimal eccentricity of the femoral head. Clinically the patient has no pain with range of motion. Bony pelvis appears to be intact. Some mild degenerative changes in the left hip.  Xr Wrist Complete Right  Result Date: 01/04/2017 Films of the right wrist obtained in 3 projections. The transverse distal radius fracture at the metaphyseal region hip is intact. Essentially nondisplaced. No fracture of the ulna. Carpus appears to be intact without evidence of acute injury. Mild degenerative changes at the base of the thumb. Some widening between the navicular and lunate    PMFS History: Patient Active Problem List   Diagnosis Date Noted  . Other cirrhosis of liver ([place]) 10/26/2016  . Idiopathic esophageal varices without bleeding ([place]) 10/26/2016  . Acute systolic heart failure ([place]) 08/16/2016  . Persistent atrial fibrillation ([place]) 08/16/2016  . Unstable angina pectoris ([place])   . ACS (acute coronary syndrome) ([place]) 08/14/2016  . Cardiomyopathy, ischemic-35-40% by cath  01/11/2015  . Acute on chronic combined systolic and diastolic heart failure ([place]) 01/11/2015  . Unstable angina- secondary to CHF, Troponin negative 01/08/2015  . Obesity 09/24/2013  . Unspecified hereditary and idiopathic peripheral neuropathy 01/10/2013  . 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 Chronic combined systolic and diastolic CHF, NYHA class 2 ([place]) 01/16/2012  . CHF (congestive heart failure) ([place]) 12/23/2011  . Esophageal varices- Plavix stopped 12/23/2011  . GI bleed 12/23/2011  . Anemia 04/22/2011  . CAD S/P prior CFX PCI with DES 08/21/2009  . Mixed hyperlipidemia 06/04/2008  . Essential hypertension 06/04/2008  . S/P CABG x 3- 2001 06/04/2008    Past Medical History:  Diagnosis Date  . CHF (congestive heart failure) ([place])   . Cirrhosis of liver ([place])   . Coronary artery disease    Status post percutaneous coronary intervention and bare metal stenting  of the left circumflex and subsequent percutaneous transluminal coronary anioplasty and placement of a 3.0 NIR bare metal stent proximal to the previous stent.  . Depression   . Esophageal varices ([place])    New 2013  . Gastroesophageal reflux disease   . H/O: hysterectomy   . History of pneumonia   . Hyperlipidemia   . Hypertension   . Obesity   . Osteoarthritis   . Persistent atrial fibrillation ([place])   . Rapid palpitations   . Status post hysterectomy   . Thrombocytopenia ([place])     Family History  Problem Relation Age of Onset  . Heart attack Mother   . Heart attack Father 51       cause of death  . Coronary artery disease Sister        CABG   . Colon cancer Neg Hx  Past Surgical History:  Procedure Laterality Date  . ABDOMINAL HYSTERECTOMY    . BACK SURGERY    . CARDIAC CATHETERIZATION  2004   left internal mammary artery to the obtuse marginal  was found to be small and thread like.  The two grafts were patent.  The left circumflex had 90% in-stent restenosis and cutting balloon angioplasty was performed followed by placement of a 3.0 x 65m Taxus drug -eluting stent.    .Marland KitchenCARDIAC CATHETERIZATION  2006   There was in-stent restenosis in the left circumflex and this was treated with cutting balloon angioplasty   . CARDIAC CATHETERIZATION  2008   vein graft to the to the obtuse marginal was patent, although small, left circumflex had 40% in-stent restenosis, ejection fraction 40-45%.  The patient was medically mananged.  . CORONARY ARTERY BYPASS GRAFT  May 31,2001   x 3 with a vein graft to the first diagonal, vein graft to the right coronary  artery, and a free left internal mammary  artery to the obtuse marginal   . JOINT REPLACEMENT    . rotator cuff left   2007  . TONSILLECTOMY     Social History   Occupational History  . Occupation: Disabled  Tobacco Use  . Smoking status: Former Smoker    Packs/day: 0.50    Years: 20.00    Pack years: 10.00    Types: Cigarettes    Last attempt to quit: 07/21/1995    Years since quitting: 21.4  . Smokeless tobacco: Never Used  Substance and Sexual Activity  . Alcohol use: No  . Drug use: No  . Sexual activity: Not on file

## 2017-01-10 DIAGNOSIS — Z1231 Encounter for screening mammogram for malignant neoplasm of breast: Secondary | ICD-10-CM | POA: Diagnosis not present

## 2017-01-17 ENCOUNTER — Ambulatory Visit (INDEPENDENT_AMBULATORY_CARE_PROVIDER_SITE_OTHER): Payer: Medicare Other | Admitting: Cardiovascular Disease

## 2017-01-17 ENCOUNTER — Encounter: Payer: Self-pay | Admitting: Cardiovascular Disease

## 2017-01-17 VITALS — BP 128/60 | HR 103 | Ht 62.0 in | Wt 207.8 lb

## 2017-01-17 DIAGNOSIS — I481 Persistent atrial fibrillation: Secondary | ICD-10-CM

## 2017-01-17 DIAGNOSIS — I5022 Chronic systolic (congestive) heart failure: Secondary | ICD-10-CM

## 2017-01-17 DIAGNOSIS — I25119 Atherosclerotic heart disease of native coronary artery with unspecified angina pectoris: Secondary | ICD-10-CM

## 2017-01-17 DIAGNOSIS — I200 Unstable angina: Secondary | ICD-10-CM

## 2017-01-17 DIAGNOSIS — I4819 Other persistent atrial fibrillation: Secondary | ICD-10-CM

## 2017-01-17 DIAGNOSIS — I209 Angina pectoris, unspecified: Secondary | ICD-10-CM | POA: Diagnosis not present

## 2017-01-17 MED ORDER — VALSARTAN 160 MG PO TABS
160.0000 mg | ORAL_TABLET | Freq: Every day | ORAL | 11 refills | Status: DC
Start: 2017-01-17 — End: 2017-03-31

## 2017-01-17 MED ORDER — CARVEDILOL 12.5 MG PO TABS: 12.5000 mg | ORAL_TABLET | Freq: Two times a day (BID) | ORAL | 11 refills | Status: DC

## 2017-01-17 NOTE — Progress Notes (Signed)
Cardiology Office Note Date:  01/17/2017   ID:  Rory, Montel Aug 13, 1950, MRN [id number]  PCP:  [name], MD  Cardiologist:  Janne Lab, MD    Chief Complaint  Patient presents with  . Shortness of Breath     History of Present Illness: Samantha [name] is a 66 y.o. female who presents for follow-up of atrial fibrillation and CAD with chronic angina. Also has chronic systolic heart failure. The patient has non-alcoholic cirrhosis and esophageal varices with hx of banding. She has not had GI bleeding. She takes ASA 81 mg daily.   She feels unsteady on her feet and has had recurrent falls. Feels more steady now and attributes some of her problem to low BP which has improved since making some adjustments in her medicines. She complains of shortness of breath with exertion and also has episodes of orthopnea and PND. No recent leg swelling.     Past Medical History:  Diagnosis Date  . CHF (congestive heart failure) ([place])   . Cirrhosis of liver ([place])   . Coronary artery disease    Status post percutaneous coronary intervention and bare metal stenting  of the left circumflex and subsequent percutaneous transluminal coronary anioplasty and placement of a 3.0 NIR bare metal stent proximal to the previous stent.  . Depression   . Esophageal varices ([place])    New 2013  . Gastroesophageal reflux disease   . H/O: hysterectomy   . History of pneumonia   . Hyperlipidemia   . Hypertension   . Obesity   . Osteoarthritis   . Persistent atrial fibrillation ([place])   . Rapid palpitations   . Status post hysterectomy   . Thrombocytopenia ([place])     Past Surgical History:  Procedure Laterality Date  . ABDOMINAL HYSTERECTOMY    . BACK SURGERY    . CARDIAC CATHETERIZATION  2004   left internal mammary artery to the obtuse marginal  was found to be small and thread like.  The two grafts were patent.  The left circumflex had 90% in-stent restenosis and cutting balloon angioplasty  was performed followed by placement of a 3.0 x 73m Taxus drug -eluting stent.    .Marland KitchenCARDIAC CATHETERIZATION  2006   There was in-stent restenosis in the left circumflex and this was treated with cutting balloon angioplasty   . CARDIAC CATHETERIZATION  2008   vein graft to the to the obtuse marginal was patent, although small, left circumflex had 40% in-stent restenosis, ejection fraction 40-45%.  The patient was medically mananged.  .Marland KitchenCARDIAC CATHETERIZATION N/A 01/09/2015   Procedure: Left Heart Cath and Cors/Grafts Angiography;  Surgeon: HBelva Crome MD;  [facility];  Service: Cardiovascular;  Laterality: N/A;  . COLONOSCOPY  03/29/2011   Procedure: COLONOSCOPY;  Surgeon: MJamesetta So MD;  [facility];  Service: Gastroenterology;  Laterality: N/A;  . CORONARY ARTERY BYPASS GRAFT  May 31,2001   x 3 with a vein graft to the first diagonal, vein graft to the right coronary  artery, and a free left internal mammary  artery to the obtuse marginal   . ESOPHAGEAL BANDING N/A 07/04/2012   Procedure: ESOPHAGEAL BANDING;  Surgeon: NRogene Houston MD;  [facility];  Service: Endoscopy;  Laterality: N/A;  . ESOPHAGEAL BANDING N/A 09/17/2012   Procedure: ESOPHAGEAL BANDING;  Surgeon: NRogene Houston MD;  [facility];  Service: Endoscopy;  Laterality: N/A;  . ESOPHAGEAL BANDING N/A  10/22/2013   Procedure: ESOPHAGEAL BANDING;  Surgeon: Rogene Houston, MD;  [facility];  Service: Endoscopy;  Laterality: N/A;  . ESOPHAGEAL BANDING N/A 11/28/2014   Procedure: ESOPHAGEAL BANDING;  Surgeon: Rogene Houston, MD;  [facility];  Service: Endoscopy;  Laterality: N/A;  . ESOPHAGEAL BANDING N/A 10/29/2015   Procedure: ESOPHAGEAL BANDING;  Surgeon: Rogene Houston, MD;  [facility];  Service: Endoscopy;  Laterality: N/A;  . ESOPHAGOGASTRODUODENOSCOPY  12/20/2011   Procedure: ESOPHAGOGASTRODUODENOSCOPY (EGD);  Surgeon: Jamesetta So, MD;  [facility];  Service: Gastroenterology;  Laterality: N/A;  . ESOPHAGOGASTRODUODENOSCOPY N/A 07/04/2012   Procedure: ESOPHAGOGASTRODUODENOSCOPY (EGD);  Surgeon: Rogene Houston, MD;  [facility];  Service: Endoscopy;  Laterality: N/A;  235-moved to 255 Ann to notify pt  . ESOPHAGOGASTRODUODENOSCOPY N/A 09/17/2012   Procedure: ESOPHAGOGASTRODUODENOSCOPY (EGD);  Surgeon: Rogene Houston, MD;  [facility];  Service: Endoscopy;  Laterality: N/A;  730  . ESOPHAGOGASTRODUODENOSCOPY N/A 10/22/2013   Procedure: ESOPHAGOGASTRODUODENOSCOPY (EGD);  Surgeon: Rogene Houston, MD;  [facility];  Service: Endoscopy;  Laterality: N/A;  730  . ESOPHAGOGASTRODUODENOSCOPY N/A 11/28/2014   Procedure: ESOPHAGOGASTRODUODENOSCOPY (EGD);  Surgeon: Rogene Houston, MD;  [facility];  Service: Endoscopy;  Laterality: N/A;  1:25  . ESOPHAGOGASTRODUODENOSCOPY N/A 10/29/2015   Procedure: ESOPHAGOGASTRODUODENOSCOPY (EGD);  Surgeon: Rogene Houston, MD;  [facility];  Service: Endoscopy;  Laterality: N/A;  12:00  . ESOPHAGOGASTRODUODENOSCOPY N/A 12/12/2016   Procedure: ESOPHAGOGASTRODUODENOSCOPY (EGD);  Surgeon: Rogene Houston, MD;  [facility];  Service: Endoscopy;  Laterality: N/A;  225  . JOINT REPLACEMENT    . LEFT HEART CATH AND CORS/GRAFTS ANGIOGRAPHY N/A 08/15/2016   Procedure: Left Heart Cath and Cors/Grafts Angiography;  Surgeon: Jettie Booze, MD;  Location: [facility];  Service: Cardiovascular;  Laterality: N/A;  . LEFT HEART CATHETERIZATION WITH CORONARY/GRAFT ANGIOGRAM N/A 12/25/2013   Procedure: LEFT HEART CATHETERIZATION WITH Beatrix Fetters;  Surgeon: Blane Ohara, MD;  [facility];  Service: Cardiovascular;  Laterality: N/A;  . rotator cuff left  2007  . TONSILLECTOMY      Current Outpatient Medications  Medication Sig Dispense Refill  . albuterol (PROVENTIL HFA;VENTOLIN HFA)  108 (90 BASE) MCG/ACT inhaler Inhale 1-2 puffs into the lungs every 6 (six) hours as needed for wheezing or shortness of breath.    [name] aspirin (ASPIRIN EC) 81 MG EC tablet Take 81 mg by mouth daily. Swallow whole.    [name] atorvastatin (LIPITOR) 20 MG tablet Take 20 mg by mouth every evening.     . carvedilol (COREG) 12.5 MG tablet Take 1 tablet (12.5 mg total) by mouth 2 (two) times daily with a meal. 60 tablet 11  . cetirizine (ZYRTEC) 10 MG tablet Take 10 mg by mouth daily as needed for allergies.     . clonazePAM (KLONOPIN) 0.5 MG tablet Take 0.5 mg by mouth daily as needed (sleep).     . docusate sodium (COLACE) 100 MG capsule Take 100 mg by mouth 2 (two) times daily.    [name] ezetimibe (ZETIA) 10 MG tablet Take 10 mg by mouth at bedtime.     [name] FLUoxetine (PROZAC) 40 MG capsule Take 40 mg by mouth every other day.     . fluticasone (FLONASE) 50 MCG/ACT nasal spray Place 2 sprays into both nostrils daily as needed for allergies.    . furosemide (LASIX)  40 MG tablet TAKE 2 TABLETS BY MOUTH ONCE DAILY TAKE  1  EXTRA  TABLET  DAILY  FOR  WEIGHT  GAIN  OF  2  POUNDS  OVERNIGHT 180 tablet 2  . HYDROcodone-acetaminophen (NORCO) 10-325 MG tablet Take 1 tablet by mouth every 6 (six) hours as needed (for pain.).     [name] isosorbide mononitrate (IMDUR) 60 MG 24 hr tablet TAKE ONE TABLET BY MOUTH ONCE DAILY 30 tablet 7  . NITROSTAT 0.4 MG SL tablet Place 0.4 mg under the tongue every 5 (five) minutes as needed for chest pain (x 3 tabs daily).     . pantoprazole (PROTONIX) 40 MG tablet Take 1 tablet (40 mg total) by mouth 2 (two) times daily. 180 tablet 2  . potassium chloride (K-DUR) 10 MEQ tablet Take 1 tablet (10 mEq total) by mouth daily. 90 tablet 3  . spironolactone (ALDACTONE) 25 MG tablet Take 1 tablet (25 mg total) by mouth daily. 30 tablet 2  . valsartan (DIOVAN) 160 MG tablet Take 1 tablet (160 mg total) by mouth daily. 30 tablet 11   No current facility-administered medications for this visit.      Allergies:   Acetaminophen; Oxycodone; Ace inhibitors; Cefaclor; Cephalexin; Penicillins; Pregabalin; and Tape   Social History:  The patient  reports that she quit smoking about 21 years ago. Her smoking use included cigarettes. She has a 10.00 pack-year smoking history. she has never used smokeless tobacco. She reports that she does not drink alcohol or use drugs.   Family History:  The patient's  family history includes Coronary artery disease in her sister; Heart attack in her mother; Heart attack (age of onset: 83) in her father.    ROS:  Please see the history of present illness.  Otherwise, review of systems is positive for depression, leg pain, palpitations, constipation.  All other systems are reviewed and negative.    PHYSICAL EXAM: VS:  BP 128/60   Pulse (!) 103   Ht 5' 2"  (1.575 m)   Wt 207 lb 12.8 oz (94.3 kg)   LMP 03/29/2011   BMI 38.01 kg/m  , BMI Body mass index is 38.01 kg/m. GEN: Well nourished, well developed, pleasant overweight woman in no acute distress  HEENT: normal  Neck: JVP elevated, no masses. No carotid bruits Cardiac: tachy, irregular, 2/6 SEM at the RUSB                Respiratory:  clear to auscultation bilaterally, normal work of breathing GI: soft, nontender, nondistended, + BS MS: no deformity or atrophy  Ext: trace bilateral pretibial edema Skin: warm and dry, no rash Neuro:  Strength and sensation are intact Psych: euthymic mood, full affect  EKG:  EKG is ordered today. The ekg ordered today shows atrial fibrillation HR 103 bpm, nonspecific intraventricular conduction delay, T wave abnormality consider lateral ischemia  Recent Labs: 08/14/2016: B Natriuretic Peptide 104.0 08/15/2016: ALT 20; BUN 14; Creatinine, Ser 0.86; Potassium 3.5; Sodium 139 08/16/2016: Hemoglobin 10.6; Platelets 50   Lipid Panel     Component Value Date/Time   CHOL 143 08/15/2016 0309   TRIG 50 08/15/2016 0309   HDL 65 08/15/2016 0309   CHOLHDL 2.2 08/15/2016  0309   VLDL 10 08/15/2016 0309   LDLCALC 68 08/15/2016 0309      Wt Readings from Last 3 Encounters:  01/17/17 207 lb 12.8 oz (94.3 kg)  01/04/17 185 lb (83.9 kg)  12/12/16 185 lb (83.9 kg)  Cardiac Studies Reviewed: Cardiac cath 08-15-2016: Conclusion     Ost 1st Diag to 1st Diag lesion, 65 %stenosed. SVG to diagonal is patent.  LIMA attached to SVG to diagonal as a Y graft. LIMA is atretic.  Prox RCA to Mid RCA lesion, 95 %stenosed. SVG to RCA is patent.  Ost Cx to Mid Cx lesion, 40 %stenosed.  The left ventricular ejection fraction is 25-35% by visual estimate.  There is moderate to severe left ventricular systolic dysfunction.  LV end diastolic pressure is moderately elevated.  There is no aortic valve stenosis.   No significant change in coronary circulation.  EF has decreased from prior report, with more pronounced inferior hypokinesis.  May need EP f/u for possible AICD placement.  Watch overnight, and plan for discharge in AM if no other procedures required.     ASSESSMENT AND PLAN: 1.  Persistent atrial fibrillation: difficult situation in this patient at high bleeding risk with background of NASH/varices. Will discuss anticoagulation options with Dr Laural Golden. Increase carvedilol to 12.5 mg BID. Consider AAD options if anticoagulation is an option.  2. Acute on chronic systolic heart failure. Would be in her best interest to pursue rhythm control strategy if possible. Decrease valsartan dose to 160 mg daily to allow for BP room to titrate carvedilol. Otherwise continue current Rx. Pt on lasix/aldactone.   3. CAD, native vessel, with angina: stable symptoms. Continue current Rx.   Current medicines are reviewed with the patient today.  The patient does not have concerns regarding medicines.  Labs/ tests ordered today include:   Orders Placed This Encounter  Procedures  . ECHOCARDIOGRAM COMPLETE    Disposition:   FU 1 month with APP. Will contact Dr  Laural Golden.   Signed, Janne Lab, MD  01/17/2017 5:41 PM    [facility] [place], [place], [place]  [address] Phone: [phone]; Fax: [phone]   ADDENDUM: I talked to Dr Laural Golden this afternoon. He will review options with IR regarding possibility of TIPS. For now should hold off on anticoagulation considering presence of both esophageal and gastric varices. May ultimately be best to manage her conservatively with rate control strategy.  Janne Lab 01/17/2017 5:43 PM

## 2017-01-17 NOTE — Patient Instructions (Signed)
Medication Instructions:  1) DECREASE VALSARTAN to 160 mg daily 2) INCREASE COREG to 12.5 mg TWICE DAILY  Labwork: None  Testing/Procedures: Your provider has requested that you have an echocardiogram. Echocardiography is a painless test that uses sound waves to create images of your heart. It provides your doctor with information about the size and shape of your heart and how well your heart's chambers and valves are working. This procedure takes approximately one hour. There are no restrictions for this procedure.  Follow-Up: Your provider recommends that you schedule a follow-up appointment in 1 MONTH with Dr. Antionette Char assistant.  Any Other Special Instructions Will Be Listed Below (If Applicable).     If you need a refill on your cardiac medications before your next appointment, please call your pharmacy.

## 2017-01-23 ENCOUNTER — Other Ambulatory Visit: Payer: Self-pay

## 2017-01-23 DIAGNOSIS — I5022 Chronic systolic (congestive) heart failure: Secondary | ICD-10-CM

## 2017-01-24 DIAGNOSIS — M545 Low back pain: Secondary | ICD-10-CM | POA: Diagnosis not present

## 2017-01-24 DIAGNOSIS — K5901 Slow transit constipation: Secondary | ICD-10-CM | POA: Diagnosis not present

## 2017-01-24 DIAGNOSIS — Z6836 Body mass index (BMI) 36.0-36.9, adult: Secondary | ICD-10-CM | POA: Diagnosis not present

## 2017-01-25 ENCOUNTER — Ambulatory Visit (HOSPITAL_COMMUNITY): Payer: Medicare Other | Attending: Cardiology

## 2017-01-25 ENCOUNTER — Ambulatory Visit (INDEPENDENT_AMBULATORY_CARE_PROVIDER_SITE_OTHER): Payer: Medicare Other | Admitting: Orthopaedic Surgery

## 2017-01-25 ENCOUNTER — Telehealth: Payer: Self-pay

## 2017-01-25 ENCOUNTER — Ambulatory Visit: Payer: Medicare Other | Admitting: Cardiovascular Disease

## 2017-01-25 ENCOUNTER — Encounter (HOSPITAL_COMMUNITY): Payer: Self-pay | Admitting: *Deleted

## 2017-01-25 ENCOUNTER — Encounter (INDEPENDENT_AMBULATORY_CARE_PROVIDER_SITE_OTHER): Payer: Self-pay | Admitting: Orthopaedic Surgery

## 2017-01-25 ENCOUNTER — Other Ambulatory Visit: Payer: Self-pay

## 2017-01-25 VITALS — BP 81/46 | HR 70 | Ht 63.0 in | Wt 207.0 lb

## 2017-01-25 DIAGNOSIS — Z87891 Personal history of nicotine dependence: Secondary | ICD-10-CM | POA: Diagnosis not present

## 2017-01-25 DIAGNOSIS — Z8249 Family history of ischemic heart disease and other diseases of the circulatory system: Secondary | ICD-10-CM | POA: Insufficient documentation

## 2017-01-25 DIAGNOSIS — M79641 Pain in right hand: Secondary | ICD-10-CM

## 2017-01-25 DIAGNOSIS — E669 Obesity, unspecified: Secondary | ICD-10-CM | POA: Diagnosis not present

## 2017-01-25 DIAGNOSIS — I071 Rheumatic tricuspid insufficiency: Secondary | ICD-10-CM | POA: Insufficient documentation

## 2017-01-25 DIAGNOSIS — E785 Hyperlipidemia, unspecified: Secondary | ICD-10-CM | POA: Insufficient documentation

## 2017-01-25 DIAGNOSIS — I4819 Other persistent atrial fibrillation: Secondary | ICD-10-CM

## 2017-01-25 DIAGNOSIS — R002 Palpitations: Secondary | ICD-10-CM | POA: Diagnosis not present

## 2017-01-25 DIAGNOSIS — I481 Persistent atrial fibrillation: Secondary | ICD-10-CM | POA: Insufficient documentation

## 2017-01-25 DIAGNOSIS — Z951 Presence of aortocoronary bypass graft: Secondary | ICD-10-CM | POA: Insufficient documentation

## 2017-01-25 DIAGNOSIS — I200 Unstable angina: Secondary | ICD-10-CM

## 2017-01-25 DIAGNOSIS — I110 Hypertensive heart disease with heart failure: Secondary | ICD-10-CM | POA: Diagnosis not present

## 2017-01-25 DIAGNOSIS — I2510 Atherosclerotic heart disease of native coronary artery without angina pectoris: Secondary | ICD-10-CM | POA: Insufficient documentation

## 2017-01-25 DIAGNOSIS — I5022 Chronic systolic (congestive) heart failure: Secondary | ICD-10-CM | POA: Diagnosis not present

## 2017-01-25 DIAGNOSIS — Z6838 Body mass index (BMI) 38.0-38.9, adult: Secondary | ICD-10-CM | POA: Insufficient documentation

## 2017-01-25 DIAGNOSIS — K7460 Unspecified cirrhosis of liver: Secondary | ICD-10-CM | POA: Insufficient documentation

## 2017-01-25 LAB — ECHOCARDIOGRAM COMPLETE
Height: 63 in
Weight: 3312 oz

## 2017-01-25 MED ORDER — METOPROLOL SUCCINATE ER 50 MG PO TB24: 50.0000 mg | ORAL_TABLET | Freq: Every day | ORAL | 11 refills | Status: DC

## 2017-01-25 NOTE — Progress Notes (Deleted)
Office Visit Note   Patient: Samantha [name]           Date of Birth: 07/04/1950           MRN: [id number] Visit Date: 01/25/2017              Requested by: Sasser, Silvestre Moment, MD [place], [place] [address] PCP: [name], MD   Assessment & Plan: Visit Diagnoses:  1. Pain of right hand     Plan: ***  Follow-Up Instructions: No Follow-up on file.   Orders:  No orders of the defined types were placed in this encounter.  No orders of the defined types were placed in this encounter.     Procedures: No procedures performed   Clinical Data: No additional findings.   Subjective: Chief Complaint  Patient presents with  . Right Wrist - Fracture, Pain    Ms. First is a 66 y o here for a F/U right wrist fx (healed) on 12/01/16. Pt still having pain     HPI  Review of Systems  Constitutional: Negative for chills, fatigue and fever.  Eyes: Negative for itching.  Respiratory: Negative for chest tightness and shortness of breath.   Cardiovascular: Negative for chest pain, palpitations and leg swelling.  Gastrointestinal: Negative for blood in stool, constipation and diarrhea.  Endocrine: Negative for polyuria.  Genitourinary: Negative for dysuria.  Musculoskeletal: Positive for joint swelling. Negative for back pain, neck pain and neck stiffness.  Allergic/Immunologic: Negative for immunocompromised state.  Neurological: Negative for dizziness and numbness.  Hematological: Does not bruise/bleed easily.  Psychiatric/Behavioral: Positive for sleep disturbance. The patient is not nervous/anxious.      Objective: Vital Signs: LMP 03/29/2011   Physical Exam  Ortho Exam  Specialty Comments:  No specialty comments available.  Imaging: No results found.   PMFS History: Patient Active Problem List   Diagnosis Date Noted  . Other cirrhosis of liver ([place]) 10/26/2016  . Idiopathic esophageal varices without bleeding ([place]) 10/26/2016  . Acute systolic heart  failure ([place]) 08/16/2016  . Persistent atrial fibrillation ([place]) 08/16/2016  . Unstable angina pectoris ([place])   . ACS (acute coronary syndrome) ([place]) 08/14/2016  . Cardiomyopathy, ischemic-35-40% by cath  01/11/2015  . Acute on chronic combined systolic and diastolic heart failure ([place]) 01/11/2015  . Unstable angina- secondary to CHF, Troponin negative 01/08/2015  . Obesity 09/24/2013  . Unspecified hereditary and idiopathic peripheral neuropathy 01/10/2013  . Cirrhosis, non-alcoholic ([place]) 86/76/7209  . Chronic combined systolic and diastolic CHF, NYHA class 2 ([place]) 01/16/2012  . CHF (congestive heart failure) ([place]) 12/23/2011  . Esophageal varices- Plavix stopped 12/23/2011  . GI bleed 12/23/2011  . Anemia 04/22/2011  . CAD S/P prior CFX PCI with DES 08/21/2009  . Mixed hyperlipidemia 06/04/2008  . Essential hypertension 06/04/2008  . S/P CABG x 3- 2001 06/04/2008   Past Medical History:  Diagnosis Date  . CHF (congestive heart failure) ([place])   . Cirrhosis of liver ([place])   . Coronary artery disease    Status post percutaneous coronary intervention and bare metal stenting  of the left circumflex and subsequent percutaneous transluminal coronary anioplasty and placement of a 3.0 NIR bare metal stent proximal to the previous stent.  . Depression   . Esophageal varices ([place])    New 2013  . Gastroesophageal reflux disease   . H/O: hysterectomy   . History of pneumonia   . Hyperlipidemia   . Hypertension   . Obesity   .  Osteoarthritis   . Persistent atrial fibrillation ([place])   . Rapid palpitations   . Status post hysterectomy   . Thrombocytopenia ([place])     Family History  Problem Relation Age of Onset  . Heart attack Mother   . Heart attack Father 54       cause of death  . Coronary artery disease Sister        CABG   . Colon cancer Neg Hx     Past Surgical History:  Procedure Laterality Date  . ABDOMINAL HYSTERECTOMY    . BACK SURGERY    . CARDIAC CATHETERIZATION   2004   left internal mammary artery to the obtuse marginal  was found to be small and thread like.  The two grafts were patent.  The left circumflex had 90% in-stent restenosis and cutting balloon angioplasty was performed followed by placement of a 3.0 x 23mm Taxus drug -eluting stent.    [name] CARDIAC CATHETERIZATION  2006   There was in-stent restenosis in the left circumflex and this was treated with cutting balloon angioplasty   . CARDIAC CATHETERIZATION  2008   vein graft to the to the obtuse marginal was patent, although small, left circumflex had 40% in-stent restenosis, ejection fraction 40-45%.  The patient was medically mananged.  [name] CARDIAC CATHETERIZATION N/A 01/09/2015   Procedure: Left Heart Cath and Cors/Grafts Angiography;  Surgeon: Belva Crome, MD;  Location: [facility];  Service: Cardiovascular;  Laterality: N/A;  . COLONOSCOPY  03/29/2011   Procedure: COLONOSCOPY;  Surgeon: Jamesetta So, MD;  [facility];  Service: Gastroenterology;  Laterality: N/A;  . CORONARY ARTERY BYPASS GRAFT  May 31,2001   x 3 with a vein graft to the first diagonal, vein graft to the right coronary  artery, and a free left internal mammary  artery to the obtuse marginal   . ESOPHAGEAL BANDING N/A 07/04/2012   Procedure: ESOPHAGEAL BANDING;  Surgeon: Rogene Houston, MD;  [facility];  Service: Endoscopy;  Laterality: N/A;  . ESOPHAGEAL BANDING N/A 09/17/2012   Procedure: ESOPHAGEAL BANDING;  Surgeon: Rogene Houston, MD;  [facility];  Service: Endoscopy;  Laterality: N/A;  . ESOPHAGEAL BANDING N/A 10/22/2013   Procedure: ESOPHAGEAL BANDING;  Surgeon: Rogene Houston, MD;  [facility];  Service: Endoscopy;  Laterality: N/A;  . ESOPHAGEAL BANDING N/A 11/28/2014   Procedure: ESOPHAGEAL BANDING;  Surgeon: Rogene Houston, MD;  [facility];  Service: Endoscopy;  Laterality: N/A;  . ESOPHAGEAL BANDING N/A 10/29/2015   Procedure: ESOPHAGEAL  BANDING;  Surgeon: Rogene Houston, MD;  [facility];  Service: Endoscopy;  Laterality: N/A;  . ESOPHAGOGASTRODUODENOSCOPY  12/20/2011   Procedure: ESOPHAGOGASTRODUODENOSCOPY (EGD);  Surgeon: Jamesetta So, MD;  [facility];  Service: Gastroenterology;  Laterality: N/A;  . ESOPHAGOGASTRODUODENOSCOPY N/A 07/04/2012   Procedure: ESOPHAGOGASTRODUODENOSCOPY (EGD);  Surgeon: Rogene Houston, MD;  [facility];  Service: Endoscopy;  Laterality: N/A;  235-moved to 255 Ann to notify pt  . ESOPHAGOGASTRODUODENOSCOPY N/A 09/17/2012   Procedure: ESOPHAGOGASTRODUODENOSCOPY (EGD);  Surgeon: Rogene Houston, MD;  [facility];  Service: Endoscopy;  Laterality: N/A;  730  . ESOPHAGOGASTRODUODENOSCOPY N/A 10/22/2013   Procedure: ESOPHAGOGASTRODUODENOSCOPY (EGD);  Surgeon: Rogene Houston, MD;  [facility];  Service: Endoscopy;  Laterality: N/A;  730  . ESOPHAGOGASTRODUODENOSCOPY N/A 11/28/2014   Procedure: ESOPHAGOGASTRODUODENOSCOPY (EGD);  Surgeon: Rogene Houston, MD;  [facility];  Service: Endoscopy;  Laterality: N/A;  1:25  . ESOPHAGOGASTRODUODENOSCOPY N/A 10/29/2015   Procedure: ESOPHAGOGASTRODUODENOSCOPY (EGD);  Surgeon: Rogene Houston, MD;  [facility];  Service: Endoscopy;  Laterality: N/A;  12:00  . ESOPHAGOGASTRODUODENOSCOPY N/A 12/12/2016   Procedure: ESOPHAGOGASTRODUODENOSCOPY (EGD);  Surgeon: Rogene Houston, MD;  [facility];  Service: Endoscopy;  Laterality: N/A;  225  . JOINT REPLACEMENT    . LEFT HEART CATH AND CORS/GRAFTS ANGIOGRAPHY N/A 08/15/2016   Procedure: Left Heart Cath and Cors/Grafts Angiography;  Surgeon: Jettie Booze, MD;  Location: [facility];  Service: Cardiovascular;  Laterality: N/A;  . LEFT HEART CATHETERIZATION WITH CORONARY/GRAFT ANGIOGRAM N/A 12/25/2013   Procedure: LEFT HEART CATHETERIZATION WITH Beatrix Fetters;  Surgeon: Blane Ohara, MD;  Location: [facility];  Service: Cardiovascular;  Laterality: N/A;  . rotator cuff left  2007  . TONSILLECTOMY     Social History   Occupational History  . Occupation: Disabled  Tobacco Use  . Smoking status: Former Smoker    Packs/day: 0.50    Years: 20.00    Pack years: 10.00    Types: Cigarettes    Last attempt to quit: 07/21/1995    Years since quitting: 21.5  . Smokeless tobacco: Never Used  Substance and Sexual Activity  . Alcohol use: No  . Drug use: No  . Sexual activity: Not on file

## 2017-01-25 NOTE — Progress Notes (Unsigned)
Patient complains of feeling extremely dizzy upon laying down for echo exam.  Heart rate is around 110 during exam, and 3D EF calculations are 47% (consistant with prior evaluation).  Blood Pressure was low at orthopedic appointment this morning, so Dr. Burt Knack (DOD and also patients normal cardiologist) was consulted.  Dr. Burt Knack spoke with and evaluated patient and decided to switch some medications around for Mrs. Alonso.  Lenice Llamas then spoke with patient and she was released feeling stable.  BP 131/84  Deliah Boston, RDCS

## 2017-01-25 NOTE — Telephone Encounter (Signed)
Patient in today for echo and complained of dizziness. Per Dr. Burt Knack, patient is to STOP COREG and START TOPROL 50 mg daily. She will keep OV with Dayna Dunn 12/18. Patient agrees with treatment plan.

## 2017-01-25 NOTE — Progress Notes (Signed)
Office Visit Note   Patient: Samantha [name]           Date of Birth: Jun 14, 1950           MRN: [id number] Visit Date: 01/25/2017              Requested by: Sasser, Silvestre Moment, MD [place], [place] [address] PCP: [name], MD   Assessment & Plan: Visit Diagnoses:  1. Pain of right hand     Plan: 7 weeks status post nondisplaced transverse fracture of the right distal radius. Still having pain in areas other than the distal radius. I think it's worth obtaining an MR arthrogram as she is so compromised. There are number of diagnostic possibilities. There appears to be some widening between the navicular and lunate. She certainly seems to have carpal tunnel syndrome. She might even have a mild de Quervain's .Her pain does seem out of proportion to what I'm seeing clinically and by films . I think it's worth obtaining the MRI scan.  Follow-Up Instructions: Return after MRI arthrogram right wrist.   Orders:  No orders of the defined types were placed in this encounter.  No orders of the defined types were placed in this encounter.     Procedures: No procedures performed   Clinical Data: No additional findings.   Subjective: Chief Complaint  Patient presents with  . Right Wrist - Fracture, Pain    Samantha [name] is a 66 y o here for a F/U right wrist fx (healed) on 12/01/16. Pt still having pain   Still having a lot of pain in her right wrist after the injury 7 weeks ago. She had a distal radius fracture that appears to be in excellent position and healing. She is really not had knee redness or significant swelling but having a lot of pain in the dorsum of her wrist and even along the distal ulna. She's had some numbness and tingling to the radial 3 digits and having some trouble sleeping at night more because of pain and because of the numbness. She really was not having much trouble with her hand or wrist prior to the injury.  HPI  Review of Systems   Objective: Vital  Signs: BP (!) 81/46   Ht 5' 3"  (1.6 m)   Wt 207 lb (93.9 kg)   LMP 03/29/2011   BMI 36.67 kg/m   Physical Exam  Ortho Exam awake alert and oriented 3. Comfortable sitting. Right dominant wrist without swelling. Had good grip and good release. No significant tenderness over the distal radius where she had the fracture. There was some tenderness in the first dorsal extensor compartment but a negative Finkelstein's test. There were areas of tenderness along the ulnocarpal joint and the dorsum of her wrist area and positive Tinel's and Phalen's over the median nerve. Good sensibility. Good opposition of thumb to little finger  Specialty Comments:  No specialty comments available.  Imaging: No results found.   PMFS History: Patient Active Problem List   Diagnosis Date Noted  . Other cirrhosis of liver ([place]) 10/26/2016  . Idiopathic esophageal varices without bleeding ([place]) 10/26/2016  . Acute systolic heart failure ([place]) 08/16/2016  . Persistent atrial fibrillation ([place]) 08/16/2016  . Unstable angina pectoris ([place])   . ACS (acute coronary syndrome) ([place]) 08/14/2016  . Cardiomyopathy, ischemic-35-40% by cath  01/11/2015  . Acute on chronic combined systolic and diastolic heart failure ([place]) 01/11/2015  . Unstable angina- secondary to CHF,  Troponin negative 01/08/2015  . Obesity 09/24/2013  . Unspecified hereditary and idiopathic peripheral neuropathy 01/10/2013  . Cirrhosis, non-alcoholic ([place]) 78/67/6720  . Chronic combined systolic and diastolic CHF, NYHA class 2 ([place]) 01/16/2012  . CHF (congestive heart failure) ([place]) 12/23/2011  . Esophageal varices- Plavix stopped 12/23/2011  . GI bleed 12/23/2011  . Anemia 04/22/2011  . CAD S/P prior CFX PCI with DES 08/21/2009  . Mixed hyperlipidemia 06/04/2008  . Essential hypertension 06/04/2008  . S/P CABG x 3- 2001 06/04/2008   Past Medical History:  Diagnosis Date  . CHF (congestive heart failure) ([place])   . Cirrhosis of liver  ([place])   . Coronary artery disease    Status post percutaneous coronary intervention and bare metal stenting  of the left circumflex and subsequent percutaneous transluminal coronary anioplasty and placement of a 3.0 NIR bare metal stent proximal to the previous stent.  . Depression   . Esophageal varices ([place])    New 2013  . Gastroesophageal reflux disease   . H/O: hysterectomy   . History of pneumonia   . Hyperlipidemia   . Hypertension   . Obesity   . Osteoarthritis   . Persistent atrial fibrillation ([place])   . Rapid palpitations   . Status post hysterectomy   . Thrombocytopenia ([place])     Family History  Problem Relation Age of Onset  . Heart attack Mother   . Heart attack Father 49       cause of death  . Coronary artery disease Sister        CABG   . Colon cancer Neg Hx     Past Surgical History:  Procedure Laterality Date  . ABDOMINAL HYSTERECTOMY    . BACK SURGERY    . CARDIAC CATHETERIZATION  2004   left internal mammary artery to the obtuse marginal  was found to be small and thread like.  The two grafts were patent.  The left circumflex had 90% in-stent restenosis and cutting balloon angioplasty was performed followed by placement of a 3.0 x 43m Taxus drug -eluting stent.    .Marland KitchenCARDIAC CATHETERIZATION  2006   There was in-stent restenosis in the left circumflex and this was treated with cutting balloon angioplasty   . CARDIAC CATHETERIZATION  2008   vein graft to the to the obtuse marginal was patent, although small, left circumflex had 40% in-stent restenosis, ejection fraction 40-45%.  The patient was medically mananged.  .Marland KitchenCARDIAC CATHETERIZATION N/A 01/09/2015   Procedure: Left Heart Cath and Cors/Grafts Angiography;  Surgeon: HBelva Crome MD;  [facility];  Service: Cardiovascular;  Laterality: N/A;  . COLONOSCOPY  03/29/2011   Procedure: COLONOSCOPY;  Surgeon: MJamesetta So MD;  [facility];  Service: Gastroenterology;  Laterality:  N/A;  . CORONARY ARTERY BYPASS GRAFT  May 31,2001   x 3 with a vein graft to the first diagonal, vein graft to the right coronary  artery, and a free left internal mammary  artery to the obtuse marginal   . ESOPHAGEAL BANDING N/A 07/04/2012   Procedure: ESOPHAGEAL BANDING;  Surgeon: NRogene Houston MD;  [facility];  Service: Endoscopy;  Laterality: N/A;  . ESOPHAGEAL BANDING N/A 09/17/2012   Procedure: ESOPHAGEAL BANDING;  Surgeon: NRogene Houston MD;  [facility];  Service: Endoscopy;  Laterality: N/A;  . ESOPHAGEAL BANDING N/A 10/22/2013   Procedure: ESOPHAGEAL BANDING;  Surgeon: NRogene Houston MD;  [facility];  Service:  Endoscopy;  Laterality: N/A;  . ESOPHAGEAL BANDING N/A 11/28/2014   Procedure: ESOPHAGEAL BANDING;  Surgeon: Rogene Houston, MD;  [facility];  Service: Endoscopy;  Laterality: N/A;  . ESOPHAGEAL BANDING N/A 10/29/2015   Procedure: ESOPHAGEAL BANDING;  Surgeon: Rogene Houston, MD;  [facility];  Service: Endoscopy;  Laterality: N/A;  . ESOPHAGOGASTRODUODENOSCOPY  12/20/2011   Procedure: ESOPHAGOGASTRODUODENOSCOPY (EGD);  Surgeon: Jamesetta So, MD;  [facility];  Service: Gastroenterology;  Laterality: N/A;  . ESOPHAGOGASTRODUODENOSCOPY N/A 07/04/2012   Procedure: ESOPHAGOGASTRODUODENOSCOPY (EGD);  Surgeon: Rogene Houston, MD;  [facility];  Service: Endoscopy;  Laterality: N/A;  235-moved to 255 Ann to notify pt  . ESOPHAGOGASTRODUODENOSCOPY N/A 09/17/2012   Procedure: ESOPHAGOGASTRODUODENOSCOPY (EGD);  Surgeon: Rogene Houston, MD;  [facility];  Service: Endoscopy;  Laterality: N/A;  730  . ESOPHAGOGASTRODUODENOSCOPY N/A 10/22/2013   Procedure: ESOPHAGOGASTRODUODENOSCOPY (EGD);  Surgeon: Rogene Houston, MD;  [facility];  Service: Endoscopy;  Laterality: N/A;  730  . ESOPHAGOGASTRODUODENOSCOPY N/A 11/28/2014   Procedure: ESOPHAGOGASTRODUODENOSCOPY (EGD);  Surgeon:  Rogene Houston, MD;  [facility];  Service: Endoscopy;  Laterality: N/A;  1:25  . ESOPHAGOGASTRODUODENOSCOPY N/A 10/29/2015   Procedure: ESOPHAGOGASTRODUODENOSCOPY (EGD);  Surgeon: Rogene Houston, MD;  [facility];  Service: Endoscopy;  Laterality: N/A;  12:00  . ESOPHAGOGASTRODUODENOSCOPY N/A 12/12/2016   Procedure: ESOPHAGOGASTRODUODENOSCOPY (EGD);  Surgeon: Rogene Houston, MD;  [facility];  Service: Endoscopy;  Laterality: N/A;  225  . JOINT REPLACEMENT    . LEFT HEART CATH AND CORS/GRAFTS ANGIOGRAPHY N/A 08/15/2016   Procedure: Left Heart Cath and Cors/Grafts Angiography;  Surgeon: Jettie Booze, MD;  Location: [facility];  Service: Cardiovascular;  Laterality: N/A;  . LEFT HEART CATHETERIZATION WITH CORONARY/GRAFT ANGIOGRAM N/A 12/25/2013   Procedure: LEFT HEART CATHETERIZATION WITH Beatrix Fetters;  Surgeon: Blane Ohara, MD;  Location: [facility] C[facility];  Service: Cardiovascular;  Laterality: N/A;  . rotator cuff left  2007  . TONSILLECTOMY     Social History   Occupational History  . Occupation: Disabled  Tobacco Use  . Smoking status: Former Smoker    Packs/day: 0.50    Years: 20.00    Pack years: 10.00    Types: Cigarettes    Last attempt to quit: 07/21/1995    Years since quitting: 21.5  . Smokeless tobacco: Never Used  Substance and Sexual Activity  . Alcohol use: No  . Drug use: No  . Sexual activity: Not on file     Garald Balding, MD   Note - This record has been created using [employer].  Chart creation errors have been sought, but may not always  have been located. Such creation errors do not reflect on  the standard of medical care.

## 2017-02-02 ENCOUNTER — Ambulatory Visit: Payer: Medicare Other | Admitting: Cardiovascular Disease

## 2017-02-03 DIAGNOSIS — S63591A Other specified sprain of right wrist, initial encounter: Secondary | ICD-10-CM | POA: Diagnosis not present

## 2017-02-03 DIAGNOSIS — S52591A Other fractures of lower end of right radius, initial encounter for closed fracture: Secondary | ICD-10-CM | POA: Diagnosis not present

## 2017-02-03 DIAGNOSIS — M25531 Pain in right wrist: Secondary | ICD-10-CM | POA: Diagnosis not present

## 2017-02-03 DIAGNOSIS — S63512A Sprain of carpal joint of left wrist, initial encounter: Secondary | ICD-10-CM | POA: Diagnosis not present

## 2017-02-08 ENCOUNTER — Encounter (INDEPENDENT_AMBULATORY_CARE_PROVIDER_SITE_OTHER): Payer: Self-pay | Admitting: Orthopaedic Surgery

## 2017-02-08 ENCOUNTER — Ambulatory Visit (INDEPENDENT_AMBULATORY_CARE_PROVIDER_SITE_OTHER): Payer: Medicare Other | Admitting: Orthopaedic Surgery

## 2017-02-08 VITALS — BP 106/58 | HR 75 | Resp 14 | Ht 62.0 in | Wt 200.0 lb

## 2017-02-08 DIAGNOSIS — I200 Unstable angina: Secondary | ICD-10-CM

## 2017-02-08 DIAGNOSIS — M25531 Pain in right wrist: Secondary | ICD-10-CM

## 2017-02-08 NOTE — Progress Notes (Signed)
Office Visit Note   Patient: Samantha [name]           Date of Birth: [birth year]           MRN: [id number] Visit Date: 02/08/2017              Requested by: Sasser, Silvestre Moment, MD [place], [place] [id number] PCP: [name], MD   Assessment & Plan: Visit Diagnoses:  1. Pain in right wrist     Plan: Healed fracture of right distal radius. MRI scan demonstrates complete tear of the TFCC and scapholunate ligament. This corresponds to her dorsal wrist pain. I will refer to Dr. Fredna Dow. A long discussion with Samantha [name] regarding the MRI scan and treatment options. She still having a fair amount of pain to the point of compromise. I think she needs a surgical evaluation  Follow-Up Instructions: Return if symptoms worsen or fail to improve.   Orders:  Orders Placed This Encounter  Procedures  . Ambulatory referral to Hand Surgery   No orders of the defined types were placed in this encounter.     Procedures: No procedures performed   Clinical Data: No additional findings.   Subjective: No chief complaint on file. Persistent pain in the dorsum of her right wrist. Prior films demonstrated nondisplaced transverse fracture of the distal radius. This appears to have healed. Pain now is localized in the dorsum of the wrist in the midline. An MRI scan with contrast demonstrates a tear of the TFCC and the scapholunate ligament with widening  HPI  Review of Systems  Constitutional: Positive for fatigue. Negative for chills and fever.  Eyes: Negative for itching.  Respiratory: Positive for chest tightness. Negative for shortness of breath.   Cardiovascular: Negative for chest pain, palpitations and leg swelling.  Gastrointestinal: Negative for blood in stool, constipation and diarrhea.  Endocrine: Negative for polyuria.  Genitourinary: Negative for dysuria.  Musculoskeletal: Positive for joint swelling. Negative for back pain, neck pain and neck stiffness.    Allergic/Immunologic: Negative for immunocompromised state.  Neurological: Negative for dizziness and numbness.  Hematological: Does not bruise/bleed easily.  Psychiatric/Behavioral: The patient is nervous/anxious.      Objective: Vital Signs: BP (!) 106/58   Pulse 75   Resp 14   Ht 5' 2"  (1.575 m)   Wt 200 lb (90.7 kg)   LMP 03/29/2011   BMI 36.58 kg/m   Physical Exam  Ortho Exam right wrist without swelling or skin change. Neurovascular exam intact. Pain localized to the mid dorsum of the wrist at the metacarpal carpal joint. Some pain with radial and ulnar deviation in the same area. Good grip and good release. No evidence of carpal tunnel.  No specialty comments available.  Imaging: No results found.   PMFS History: Patient Active Problem List   Diagnosis Date Noted  . Other cirrhosis of liver ([place]) 10/26/2016  . Idiopathic esophageal varices without bleeding ([place]) 10/26/2016  . Acute systolic heart failure ([place]) 08/16/2016  . Persistent atrial fibrillation ([place]) 08/16/2016  . Unstable angina pectoris ([place])   . ACS (acute coronary syndrome) ([place]) 08/14/2016  . Cardiomyopathy, ischemic-35-40% by cath  01/11/2015  . Acute on chronic combined systolic and diastolic heart failure ([place]) 01/11/2015  . Unstable angina- secondary to CHF, Troponin negative 01/08/2015  . Obesity 09/24/2013  . Unspecified hereditary and idiopathic peripheral neuropathy 01/10/2013  . Cirrhosis, non-alcoholic ([place]) 37/34/2876  . Chronic combined systolic and diastolic CHF, NYHA class 2 ([place])  01/16/2012  . CHF (congestive heart failure) ([place]) 12/23/2011  . Esophageal varices- Plavix stopped 12/23/2011  . GI bleed 12/23/2011  . Anemia 04/22/2011  . CAD S/P prior CFX PCI with DES 08/21/2009  . Mixed hyperlipidemia 06/04/2008  . Essential hypertension 06/04/2008  . S/P CABG x 3- 2001 06/04/2008   Past Medical History:  Diagnosis Date  . CHF (congestive heart failure) ([place])   . Cirrhosis of  liver ([place])   . Coronary artery disease    Status post percutaneous coronary intervention and bare metal stenting  of the left circumflex and subsequent percutaneous transluminal coronary anioplasty and placement of a 3.0 NIR bare metal stent proximal to the previous stent.  . Depression   . Esophageal varices ([place])    New 2013  . Gastroesophageal reflux disease   . H/O: hysterectomy   . History of pneumonia   . Hyperlipidemia   . Hypertension   . Obesity   . Osteoarthritis   . Persistent atrial fibrillation ([place])   . Rapid palpitations   . Status post hysterectomy   . Thrombocytopenia ([place])     Family History  Problem Relation Age of Onset  . Heart attack Mother   . Heart attack Father 84       cause of death  . Coronary artery disease Sister        CABG   . Colon cancer Neg Hx     Past Surgical History:  Procedure Laterality Date  . ABDOMINAL HYSTERECTOMY    . BACK SURGERY    . CARDIAC CATHETERIZATION  2004   left internal mammary artery to the obtuse marginal  was found to be small and thread like.  The two grafts were patent.  The left circumflex had 90% in-stent restenosis and cutting balloon angioplasty was performed followed by placement of a 3.0 x 55m Taxus drug -eluting stent.    .Marland KitchenCARDIAC CATHETERIZATION  2006   There was in-stent restenosis in the left circumflex and this was treated with cutting balloon angioplasty   . CARDIAC CATHETERIZATION  2008   vein graft to the to the obtuse marginal was patent, although small, left circumflex had 40% in-stent restenosis, ejection fraction 40-45%.  The patient was medically mananged.  .Marland KitchenCARDIAC CATHETERIZATION N/A 01/09/2015   Procedure: Left Heart Cath and Cors/Grafts Angiography;  Surgeon: HBelva Crome MD;  [facility];  Service: Cardiovascular;  Laterality: N/A;  . COLONOSCOPY  03/29/2011   Procedure: COLONOSCOPY;  Surgeon: MJamesetta So MD;  [facility];  Service: Gastroenterology;   Laterality: N/A;  . CORONARY ARTERY BYPASS GRAFT  May 31,2001   x 3 with a vein graft to the first diagonal, vein graft to the right coronary  artery, and a free left internal mammary  artery to the obtuse marginal   . ESOPHAGEAL BANDING N/A 07/04/2012   Procedure: ESOPHAGEAL BANDING;  Surgeon: NRogene Houston MD;  [facility];  Service: Endoscopy;  Laterality: N/A;  . ESOPHAGEAL BANDING N/A 09/17/2012   Procedure: ESOPHAGEAL BANDING;  Surgeon: NRogene Houston MD;  [facility];  Service: Endoscopy;  Laterality: N/A;  . ESOPHAGEAL BANDING N/A 10/22/2013   Procedure: ESOPHAGEAL BANDING;  Surgeon: NRogene Houston MD;  [facility];  Service: Endoscopy;  Laterality: N/A;  . ESOPHAGEAL BANDING N/A 11/28/2014   Procedure: ESOPHAGEAL BANDING;  Surgeon: NRogene Houston MD;  [facility];  Service: Endoscopy;  Laterality: N/A;  .  ESOPHAGEAL BANDING N/A 10/29/2015   Procedure: ESOPHAGEAL BANDING;  Surgeon: Rogene Houston, MD;  [facility];  Service: Endoscopy;  Laterality: N/A;  . ESOPHAGOGASTRODUODENOSCOPY  12/20/2011   Procedure: ESOPHAGOGASTRODUODENOSCOPY (EGD);  Surgeon: Jamesetta So, MD;  [facility];  Service: Gastroenterology;  Laterality: N/A;  . ESOPHAGOGASTRODUODENOSCOPY N/A 07/04/2012   Procedure: ESOPHAGOGASTRODUODENOSCOPY (EGD);  Surgeon: Rogene Houston, MD;  [facility];  Service: Endoscopy;  Laterality: N/A;  235-moved to 255 Ann to notify pt  . ESOPHAGOGASTRODUODENOSCOPY N/A 09/17/2012   Procedure: ESOPHAGOGASTRODUODENOSCOPY (EGD);  Surgeon: Rogene Houston, MD;  [facility];  Service: Endoscopy;  Laterality: N/A;  730  . ESOPHAGOGASTRODUODENOSCOPY N/A 10/22/2013   Procedure: ESOPHAGOGASTRODUODENOSCOPY (EGD);  Surgeon: Rogene Houston, MD;  [facility];  Service: Endoscopy;  Laterality: N/A;  730  . ESOPHAGOGASTRODUODENOSCOPY N/A 11/28/2014   Procedure: ESOPHAGOGASTRODUODENOSCOPY (EGD);   Surgeon: Rogene Houston, MD;  [facility];  Service: Endoscopy;  Laterality: N/A;  1:25  . ESOPHAGOGASTRODUODENOSCOPY N/A 10/29/2015   Procedure: ESOPHAGOGASTRODUODENOSCOPY (EGD);  Surgeon: Rogene Houston, MD;  [facility];  Service: Endoscopy;  Laterality: N/A;  12:00  . ESOPHAGOGASTRODUODENOSCOPY N/A 12/12/2016   Procedure: ESOPHAGOGASTRODUODENOSCOPY (EGD);  Surgeon: Rogene Houston, MD;  [facility];  Service: Endoscopy;  Laterality: N/A;  225  . JOINT REPLACEMENT    . LEFT HEART CATH AND CORS/GRAFTS ANGIOGRAPHY N/A 08/15/2016   Procedure: Left Heart Cath and Cors/Grafts Angiography;  Surgeon: Jettie Booze, MD;  Location: [facility];  Service: Cardiovascular;  Laterality: N/A;  . LEFT HEART CATHETERIZATION WITH CORONARY/GRAFT ANGIOGRAM N/A 12/25/2013   Procedure: LEFT HEART CATHETERIZATION WITH Beatrix Fetters;  Surgeon: Blane Ohara, MD;  Location: [facility];  Service: Cardiovascular;  Laterality: N/A;  . rotator cuff left  2007  . TONSILLECTOMY     Social History   Occupational History  . Occupation: Disabled  Tobacco Use  . Smoking status: Former Smoker    Packs/day: 0.50    Years: 20.00    Pack years: 10.00    Types: Cigarettes    Last attempt to quit: 07/21/1995    Years since quitting: 21.5  . Smokeless tobacco: Never Used  Substance and Sexual Activity  . Alcohol use: No  . Drug use: No  . Sexual activity: Not on file

## 2017-02-10 DIAGNOSIS — S52571A Other intraarticular fracture of lower end of right radius, initial encounter for closed fracture: Secondary | ICD-10-CM | POA: Diagnosis not present

## 2017-02-10 DIAGNOSIS — R52 Pain, unspecified: Secondary | ICD-10-CM | POA: Diagnosis not present

## 2017-02-10 DIAGNOSIS — M25531 Pain in right wrist: Secondary | ICD-10-CM | POA: Diagnosis not present

## 2017-02-13 ENCOUNTER — Encounter: Payer: Self-pay | Admitting: Physician Assistant

## 2017-02-13 NOTE — Progress Notes (Addendum)
Cardiology Office Note    Date:  02/14/2017  ID:  Samantha [name], DOB [birth year], MRN [id number] PCP:  [name], MD  Cardiologist:  Dr. Burt Knack   Chief Complaint: f/u CHF, afib  History of Present Illness:  Samantha [name] is a 66 y.o. female with history of CAD s/p CABG 2001 w/ (LIMA-OM, SVG-D1, SVG-RCA), multiple PCIs, chronic angina, esophageal varices requiring banding (Plavix previously stopped due to this), non-ETOH cirrhosis, chronic systolic CHF, HTN, HLD, DM, obesity, atrial flutter (dx 05/2016), persistent atrial fibrillation who returns for f/u.  To recap history, she was hospitalized in 2016 for CHF requiring diuresis, prior EF in 2016 was 40-45%. She was diagnosed with atrial flutter in 05/2016. Dr. Burt Knack did not feel she was a candidate for anticoagulation given cirrhosis and varices. In 07/2016 she was admitted with chest pain and CHF. She was reportedly in atrial fibrillation during the admission. Cath 08/15/16 showed no change in coronary circulation, EF 25-35%, moderate-severe left ventricular systolic dysfunction, moderately elevated LVEDP. She was started on spironolactone. Dr. Burt Knack saw her 01/17/17 at which time he decreased valsartan to allow room to titrate carvedilol. She reported dizziness at time of echo so carvedilol was changed to metoprolol. F/u 2D echo to reassess LVEF 01/25/17 showed EF 30-35% with wall motion abnormalities, mild LVH, mildly dilated RV with moderately decreased systolic function, no significant valvular abnormalities. Dr. Burt Knack discussed case with GI Dr Laural Golden who agreed she is candidate for anticoagulation with gastric varices, thus we are limited to a rate-control strategy for atrial fibrillation. Recent labs otherwise showed baseline INR 1.2 (09/2016), pancytopenia with WBC 3, Hgb 10.6, plt 50, LDL 68, K 3.5, glucose 111, normal LFTs (07/2016).  POC labs from primary care 11/22/16 showed Hgb 10.7, Cr 1.11.  She returns for follow-up today feeling  "about the same." For the last several months she continues to have dizziness a few times a week. She has good days and bad days. This happens primarily when she lays down. She also has noticed it sometimes while coming up from the beauty bowl at the salon. It does not routinely happen from sitting to standing. I checked her blood pressure standing today and this was not indicative of orthostasis. She was not dizzy upon standing today. She occasionally has chest tightness from time to time relieved with 1 SL NTG. There has not ben any change lately. No significant dyspnea, orthopnea, LEE, PND or any bleeding. Her weight is up several lbs but she states when she has frequent doctor's visits she will delay taking her spironolactone and Lasix until after the visit, at which time she will promptly urinate over 5lbs in the next few hours. She's not very active. She states she's come to accept this is generally as good as she will feel, and that she's taking it day by day. She actually appears quite lively during the visit and even did a little dance when she found out Lanny Hurst was in the lab today to draw her blood. Today is one of her better days.   Past Medical History:  Diagnosis Date  . Chronic systolic CHF (congestive heart failure) ([place])   . Cirrhosis of liver ([place])   . Coronary artery disease    a. s/p CABG 2001 w/ (LIMA-OM, SVG-D1, SVG-RCA). b. h/o multiple PCIs per Dr. Antionette Char note.  . Depression   . Esophageal varices ([place])    New 2013  . Gastroesophageal reflux disease   . History of pneumonia   .  Hyperlipidemia   . Hypertension   . Obesity   . Osteoarthritis   . Paroxysmal atrial flutter ([place])    a. dx 05/2016.  [name] Persistent atrial fibrillation ([place])    a. reported in hosp 07/2016, not on anticoag due to cirrhosis and liver disease, low platelets, varices.  . Thrombocytopenia ([place])     Past Surgical History:  Procedure Laterality Date  . ABDOMINAL HYSTERECTOMY    . BACK SURGERY    .  CARDIAC CATHETERIZATION  2004   left internal mammary artery to the obtuse marginal  was found to be small and thread like.  The two grafts were patent.  The left circumflex had 90% in-stent restenosis and cutting balloon angioplasty was performed followed by placement of a 3.0 x 74m Taxus drug -eluting stent.    .Marland KitchenCARDIAC CATHETERIZATION  2006   There was in-stent restenosis in the left circumflex and this was treated with cutting balloon angioplasty   . CARDIAC CATHETERIZATION  2008   vein graft to the to the obtuse marginal was patent, although small, left circumflex had 40% in-stent restenosis, ejection fraction 40-45%.  The patient was medically mananged.  .Marland KitchenCARDIAC CATHETERIZATION N/A 01/09/2015   Procedure: Left Heart Cath and Cors/Grafts Angiography;  Surgeon: HBelva Crome MD;  [facility];  Service: Cardiovascular;  Laterality: N/A;  . COLONOSCOPY  03/29/2011   Procedure: COLONOSCOPY;  Surgeon: MJamesetta So MD;  [facility];  Service: Gastroenterology;  Laterality: N/A;  . CORONARY ARTERY BYPASS GRAFT  May 31,2001   x 3 with a vein graft to the first diagonal, vein graft to the right coronary  artery, and a free left internal mammary  artery to the obtuse marginal   . ESOPHAGEAL BANDING N/A 07/04/2012   Procedure: ESOPHAGEAL BANDING;  Surgeon: NRogene Houston MD;  [facility];  Service: Endoscopy;  Laterality: N/A;  . ESOPHAGEAL BANDING N/A 09/17/2012   Procedure: ESOPHAGEAL BANDING;  Surgeon: NRogene Houston MD;  [facility];  Service: Endoscopy;  Laterality: N/A;  . ESOPHAGEAL BANDING N/A 10/22/2013   Procedure: ESOPHAGEAL BANDING;  Surgeon: NRogene Houston MD;  [facility];  Service: Endoscopy;  Laterality: N/A;  . ESOPHAGEAL BANDING N/A 11/28/2014   Procedure: ESOPHAGEAL BANDING;  Surgeon: NRogene Houston MD;  [facility];  Service: Endoscopy;  Laterality: N/A;  . ESOPHAGEAL BANDING N/A 10/29/2015    Procedure: ESOPHAGEAL BANDING;  Surgeon: NRogene Houston MD;  [facility];  Service: Endoscopy;  Laterality: N/A;  . ESOPHAGOGASTRODUODENOSCOPY  12/20/2011   Procedure: ESOPHAGOGASTRODUODENOSCOPY (EGD);  Surgeon: MJamesetta So MD;  [facility];  Service: Gastroenterology;  Laterality: N/A;  . ESOPHAGOGASTRODUODENOSCOPY N/A 07/04/2012   Procedure: ESOPHAGOGASTRODUODENOSCOPY (EGD);  Surgeon: NRogene Houston MD;  [facility];  Service: Endoscopy;  Laterality: N/A;  235-moved to 255 Ann to notify pt  . ESOPHAGOGASTRODUODENOSCOPY N/A 09/17/2012   Procedure: ESOPHAGOGASTRODUODENOSCOPY (EGD);  Surgeon: NRogene Houston MD;  [facility];  Service: Endoscopy;  Laterality: N/A;  730  . ESOPHAGOGASTRODUODENOSCOPY N/A 10/22/2013   Procedure: ESOPHAGOGASTRODUODENOSCOPY (EGD);  Surgeon: NRogene Houston MD;  [facility];  Service: Endoscopy;  Laterality: N/A;  730  . ESOPHAGOGASTRODUODENOSCOPY N/A 11/28/2014   Procedure: ESOPHAGOGASTRODUODENOSCOPY (EGD);  Surgeon: NRogene Houston MD;  [facility];  Service: Endoscopy;  Laterality: N/A;  1:25  . ESOPHAGOGASTRODUODENOSCOPY N/A 10/29/2015   Procedure: ESOPHAGOGASTRODUODENOSCOPY (EGD);  Surgeon: Rogene Houston, MD;  [facility];  Service: Endoscopy;  Laterality: N/A;  12:00  . ESOPHAGOGASTRODUODENOSCOPY N/A 12/12/2016   Procedure: ESOPHAGOGASTRODUODENOSCOPY (EGD);  Surgeon: Rogene Houston, MD;  [facility];  Service: Endoscopy;  Laterality: N/A;  225  . JOINT REPLACEMENT    . LEFT HEART CATH AND CORS/GRAFTS ANGIOGRAPHY N/A 08/15/2016   Procedure: Left Heart Cath and Cors/Grafts Angiography;  Surgeon: Jettie Booze, MD;  Location: [facility];  Service: Cardiovascular;  Laterality: N/A;  . LEFT HEART CATHETERIZATION WITH CORONARY/GRAFT ANGIOGRAM N/A 12/25/2013   Procedure: LEFT HEART CATHETERIZATION WITH Beatrix Fetters;  Surgeon: Blane Ohara,  MD;  Location: [facility];  Service: Cardiovascular;  Laterality: N/A;  . rotator cuff left  2007  . TONSILLECTOMY      Current Medications: Current Meds  Medication Sig  . albuterol (PROVENTIL HFA;VENTOLIN HFA) 108 (90 BASE) MCG/ACT inhaler Inhale 1-2 puffs into the lungs every 6 (six) hours as needed for wheezing or shortness of breath.  [name] aspirin (ASPIRIN EC) 81 MG EC tablet Take 81 mg by mouth daily. Swallow whole.  [name] atorvastatin (LIPITOR) 20 MG tablet Take 20 mg by mouth every evening.   . cetirizine (ZYRTEC) 10 MG tablet Take 10 mg by mouth daily as needed for allergies.   [name] docusate sodium (COLACE) 100 MG capsule Take 100 mg by mouth 2 (two) times daily.  [name] ezetimibe (ZETIA) 10 MG tablet Take 10 mg by mouth at bedtime.   [name] FLUoxetine (PROZAC) 40 MG capsule Take 40 mg by mouth every other day.   . fluticasone (FLONASE) 50 MCG/ACT nasal spray Place 2 sprays into both nostrils daily as needed for allergies.  . furosemide (LASIX) 40 MG tablet TAKE 2 TABLETS BY MOUTH ONCE DAILY TAKE  1  EXTRA  TABLET  DAILY  FOR  WEIGHT  GAIN  OF  2  POUNDS  OVERNIGHT  . HYDROcodone-acetaminophen (NORCO) 10-325 MG tablet Take 1 tablet by mouth every 6 (six) hours as needed (for pain.).   [name] isosorbide mononitrate (IMDUR) 60 MG 24 hr tablet TAKE ONE TABLET BY MOUTH ONCE DAILY  . metoprolol succinate (TOPROL-XL) 50 MG 24 hr tablet Take 1 tablet (50 mg total) by mouth daily. Take with or immediately following a meal.  . NITROSTAT 0.4 MG SL tablet Place 0.4 mg under the tongue every 5 (five) minutes as needed for chest pain (x 3 tabs daily).   . pantoprazole (PROTONIX) 40 MG tablet Take 1 tablet (40 mg total) by mouth 2 (two) times daily.  . potassium chloride (K-DUR) 10 MEQ tablet Take 1 tablet (10 mEq total) by mouth daily.  [name] spironolactone (ALDACTONE) 25 MG tablet Take 1 tablet (25 mg total) by mouth daily.  . valsartan (DIOVAN) 160 MG tablet Take 1 tablet (160 mg total) by mouth daily.     Allergies:    Acetaminophen; Oxycodone; Ace inhibitors; Cefaclor; Cephalexin; Penicillins; Pregabalin; and Tape   Social History   Socioeconomic History  . Marital status: Divorced    Spouse name: None  . Number of children: None  . Years of education: None  . Highest education level: None  Social Needs  . Financial resource strain: None  . Food insecurity - worry: None  . Food insecurity - inability: None  . Transportation needs - medical: None  . Transportation needs - non-medical: None  Occupational History  . Occupation: Disabled  Tobacco Use  . Smoking status: Former Smoker  Packs/day: 0.50    Years: 20.00    Pack years: 10.00    Types: Cigarettes    Last attempt to quit: 07/21/1995    Years since quitting: 21.5  . Smokeless tobacco: Never Used  Substance and Sexual Activity  . Alcohol use: No  . Drug use: No  . Sexual activity: None  Other Topics Concern  . None  Social History Narrative   Lives in [place] by herself.       Family History:  Family History  Problem Relation Age of Onset  . Heart attack Mother   . Heart attack Father 75       cause of death  . Coronary artery disease Sister        CABG   . Colon cancer Neg Hx     ROS:   Please see the history of present illness.  All other systems are reviewed and otherwise negative.    PHYSICAL EXAM:   VS:  BP 116/68   Pulse 93   Ht 5' 2" (1.575 m)   Wt 208 lb 12.8 oz (94.7 kg)   LMP 03/29/2011   BMI 38.19 kg/m   BMI: Body mass index is 38.19 kg/m. GEN: Well nourished, well developed WF in no acute distress  HEENT: normocephalic, atraumatic Neck: no JVD, carotid bruits, or masses Cardiac: irregularly irregular; no murmurs, rubs, or gallops, no edema  Respiratory:  clear to auscultation bilaterally, normal work of breathing GI: soft, nontender, nondistended, + BS MS: no deformity or atrophy  Skin: warm and dry, no rash, complexion is somewhat tanned, have not met before to compare as jaundice Neuro:  Alert  and Oriented x 3, Strength and sensation are intact, follows commands Psych: euthymic mood, full affect  Wt Readings from Last 3 Encounters:  02/14/17 208 lb 12.8 oz (94.7 kg)  02/08/17 200 lb (90.7 kg)  01/25/17 207 lb (93.9 kg)      Studies/Labs Reviewed:   EKG:  EKG was ordered today and personally reviewed by me and demonstrates atiral fib/flutter 93bpm, NSIVCD, nonspecific TW changes  Recent Labs: 08/14/2016: B Natriuretic Peptide 104.0 08/15/2016: ALT 20; BUN 14; Creatinine, Ser 0.86; Potassium 3.5; Sodium 139 08/16/2016: Hemoglobin 10.6; Platelets 50   Lipid Panel    Component Value Date/Time   CHOL 143 08/15/2016 0309   TRIG 50 08/15/2016 0309   HDL 65 08/15/2016 0309   CHOLHDL 2.2 08/15/2016 0309   VLDL 10 08/15/2016 0309   LDLCALC 68 08/15/2016 0309    Additional studies/ records that were reviewed today include: Summarized above    ASSESSMENT & PLAN:   1. Persistent atrial fibrillation/paroxysmal atrial flutter - per Dr. Antionette Char notes, the focus is on a rate-control strategy given inability to anticoagulate. This eliminates the options of antiarrhythmic therapy and Watchman from the table. She asked about a pacemaker today. I explained that a pacemaker does not typically correct atrial fib, but there is a more complicated concept of AV node ablation and pacemaker. With her comorbidities, I am not convinced she'd be a good candidate for this. She may be as optimized as she can tolerate from a dizziness standpoint. I am not sure the etiology of this as it does not seem acutely tied to blood pressure changes. Her blood pressure does tend to run low-normal. I will review further strategies with Dr. Burt Knack. 2. Dizziness - as above, not clearly orthostatic in nature. She does not think it feels like prior vertigo. If this persists, may need head imaging.  Trial of otolith repositioning (I.e. Epley) could also be considered. I've asked her to f/u with primary care to discuss.  Will also check potassium given initiation of spironolactone earlier this year. Will also f/u LFTs (given cirrhosis and complexion) and Cr. Addendum: potassium is 5.3. Given hyperkalemia will have her stop KCl (only at 67mq) and cut spironolactone in half with repeat BMET in 1 week. 3. CAD s/p CABG - with chronic stable angina, unchanged. Cath 07/2016 without significant change from prior. Continue ASA, BB, statin as tolerated. 4. Chronic systolic CHF - weight extremely variable in Epic. She is 1lb up from last cardiology OV. She reports inconsistency with diuretic dosing during days with doctor's appts. She actually does not appear markedly volume overloaded. I anticipate diuresis when she resumes her meds this afternoon. Cannot titrate meds further due to tendency for lower blood pressure.   Disposition: F/u with Dr. CBurt Knackin 3 months.  Medication Adjustments/Labs and Tests Ordered: Current medicines are reviewed at length with the patient today.  Concerns regarding medicines are outlined above. Medication changes, Labs and Tests ordered today are summarized above and listed in the Patient Instructions accessible in Encounters.   Signed, DCharlie Pitter PA-C  02/14/2017 10:49 AM    [facility] [address] [place]  [id number]: [phone] Fax: [phone]

## 2017-02-14 ENCOUNTER — Telehealth: Payer: Self-pay | Admitting: *Deleted

## 2017-02-14 ENCOUNTER — Encounter: Payer: Self-pay | Admitting: Physician Assistant

## 2017-02-14 ENCOUNTER — Ambulatory Visit (INDEPENDENT_AMBULATORY_CARE_PROVIDER_SITE_OTHER): Payer: Medicare Other | Admitting: Physician Assistant

## 2017-02-14 VITALS — BP 116/68 | HR 93 | Ht 62.0 in | Wt 208.8 lb

## 2017-02-14 DIAGNOSIS — R42 Dizziness and giddiness: Secondary | ICD-10-CM | POA: Diagnosis not present

## 2017-02-14 DIAGNOSIS — Z951 Presence of aortocoronary bypass graft: Secondary | ICD-10-CM

## 2017-02-14 DIAGNOSIS — E875 Hyperkalemia: Secondary | ICD-10-CM

## 2017-02-14 DIAGNOSIS — I2510 Atherosclerotic heart disease of native coronary artery without angina pectoris: Secondary | ICD-10-CM

## 2017-02-14 DIAGNOSIS — I4892 Unspecified atrial flutter: Secondary | ICD-10-CM

## 2017-02-14 DIAGNOSIS — I5022 Chronic systolic (congestive) heart failure: Secondary | ICD-10-CM | POA: Diagnosis not present

## 2017-02-14 DIAGNOSIS — I481 Persistent atrial fibrillation: Secondary | ICD-10-CM

## 2017-02-14 DIAGNOSIS — I4819 Other persistent atrial fibrillation: Secondary | ICD-10-CM

## 2017-02-14 DIAGNOSIS — I200 Unstable angina: Secondary | ICD-10-CM

## 2017-02-14 LAB — COMPREHENSIVE METABOLIC PANEL
ALT: 15 IU/L (ref 0–32)
AST: 36 IU/L (ref 0–40)
Albumin/Globulin Ratio: 1.8 (ref 1.2–2.2)
Albumin: 4.5 g/dL (ref 3.6–4.8)
Alkaline Phosphatase: 74 IU/L (ref 39–117)
BUN/Creatinine Ratio: 18 (ref 12–28)
BUN: 19 mg/dL (ref 8–27)
Bilirubin Total: 0.5 mg/dL (ref 0.0–1.2)
CO2: 23 mmol/L (ref 20–29)
Calcium: 9.4 mg/dL (ref 8.7–10.3)
Chloride: 102 mmol/L (ref 96–106)
Creatinine, Ser: 1.08 mg/dL — ABNORMAL HIGH (ref 0.57–1.00)
GFR calc Af Amer: 61 mL/min/{1.73_m2} (ref >59)
GFR calc non Af Amer: 53 mL/min/{1.73_m2} — ABNORMAL LOW (ref >59)
Globulin, Total: 2.5 g/dL (ref 1.5–4.5)
Glucose: 111 mg/dL — ABNORMAL HIGH (ref 65–99)
Potassium: 5.3 mmol/L — ABNORMAL HIGH (ref 3.5–5.2)
Sodium: 138 mmol/L (ref 134–144)
Total Protein: 7 g/dL (ref 6.0–8.5)

## 2017-02-14 NOTE — Telephone Encounter (Signed)
Notes recorded by Katrine Coho, RN on 02/14/2017 at 5:11 PM EST Discussed with patient, she verbalized understanding, repeat BMET scheduled for AM 02/20/17 ------  Notes recorded by Charlie Pitter, PA-C on 02/14/2017 at 4:56 PM EST Please let patient know CMET generally stable except potassium borderline elevated. Please stop potassium and cut spironolactone to 12.5mg  daily. Recheck 1 week. Avoid sources of excess potassium either in diet or supplement form. Dayna Dunn PA-C

## 2017-02-14 NOTE — Patient Instructions (Addendum)
Medication Instructions:  Your physician recommends that you continue on your current medications as directed. Please refer to the Current Medication list given to you today.   Labwork: TODAY:  CMET  Testing/Procedures: None ordered  Follow-Up: Your physician recommends that you schedule a follow-up appointment in: 3 MONTHS WITH DR. Burt Knack   Any Other Special Instructions Will Be Listed Below (If Applicable).     If you need a refill on your cardiac medications before your next appointment, please call your pharmacy.

## 2017-02-20 ENCOUNTER — Other Ambulatory Visit: Payer: Medicare Other | Admitting: *Deleted

## 2017-02-20 ENCOUNTER — Other Ambulatory Visit: Payer: Medicare Other

## 2017-02-20 DIAGNOSIS — S52571D Other intraarticular fracture of lower end of right radius, subsequent encounter for closed fracture with routine healing: Secondary | ICD-10-CM | POA: Diagnosis not present

## 2017-02-20 DIAGNOSIS — S6981XA Other specified injuries of right wrist, hand and finger(s), initial encounter: Secondary | ICD-10-CM | POA: Diagnosis not present

## 2017-02-20 DIAGNOSIS — E875 Hyperkalemia: Secondary | ICD-10-CM

## 2017-02-20 DIAGNOSIS — M79641 Pain in right hand: Secondary | ICD-10-CM | POA: Diagnosis not present

## 2017-02-20 DIAGNOSIS — S52501D Unspecified fracture of the lower end of right radius, subsequent encounter for closed fracture with routine healing: Secondary | ICD-10-CM | POA: Insufficient documentation

## 2017-02-20 LAB — BASIC METABOLIC PANEL
BUN/Creatinine Ratio: 19 (ref 12–28)
BUN: 25 mg/dL (ref 8–27)
CO2: 22 mmol/L (ref 20–29)
Calcium: 9.2 mg/dL (ref 8.7–10.3)
Chloride: 105 mmol/L (ref 96–106)
Creatinine, Ser: 1.29 mg/dL — ABNORMAL HIGH (ref 0.57–1.00)
GFR calc Af Amer: 49 mL/min/{1.73_m2} — ABNORMAL LOW (ref >59)
GFR calc non Af Amer: 43 mL/min/{1.73_m2} — ABNORMAL LOW (ref >59)
Glucose: 142 mg/dL — ABNORMAL HIGH (ref 65–99)
Potassium: 4.8 mmol/L (ref 3.5–5.2)
Sodium: 143 mmol/L (ref 134–144)

## 2017-02-21 DIAGNOSIS — K766 Portal hypertension: Secondary | ICD-10-CM | POA: Diagnosis not present

## 2017-02-21 DIAGNOSIS — R161 Splenomegaly, not elsewhere classified: Secondary | ICD-10-CM | POA: Diagnosis not present

## 2017-02-21 DIAGNOSIS — Z7982 Long term (current) use of aspirin: Secondary | ICD-10-CM | POA: Diagnosis not present

## 2017-02-21 DIAGNOSIS — R51 Headache: Secondary | ICD-10-CM | POA: Diagnosis not present

## 2017-02-21 DIAGNOSIS — Z79899 Other long term (current) drug therapy: Secondary | ICD-10-CM | POA: Diagnosis not present

## 2017-02-21 DIAGNOSIS — K219 Gastro-esophageal reflux disease without esophagitis: Secondary | ICD-10-CM | POA: Diagnosis not present

## 2017-02-21 DIAGNOSIS — R0989 Other specified symptoms and signs involving the circulatory and respiratory systems: Secondary | ICD-10-CM | POA: Diagnosis not present

## 2017-02-21 DIAGNOSIS — I252 Old myocardial infarction: Secondary | ICD-10-CM | POA: Diagnosis not present

## 2017-02-21 DIAGNOSIS — K7460 Unspecified cirrhosis of liver: Secondary | ICD-10-CM | POA: Diagnosis not present

## 2017-02-21 DIAGNOSIS — F329 Major depressive disorder, single episode, unspecified: Secondary | ICD-10-CM | POA: Diagnosis not present

## 2017-02-21 DIAGNOSIS — F419 Anxiety disorder, unspecified: Secondary | ICD-10-CM | POA: Diagnosis not present

## 2017-02-21 DIAGNOSIS — K625 Hemorrhage of anus and rectum: Secondary | ICD-10-CM | POA: Diagnosis not present

## 2017-02-22 ENCOUNTER — Encounter: Payer: Self-pay | Admitting: Physician Assistant

## 2017-02-22 DIAGNOSIS — F419 Anxiety disorder, unspecified: Secondary | ICD-10-CM | POA: Diagnosis not present

## 2017-02-22 DIAGNOSIS — I864 Gastric varices: Secondary | ICD-10-CM | POA: Diagnosis present

## 2017-02-22 DIAGNOSIS — K6289 Other specified diseases of anus and rectum: Secondary | ICD-10-CM | POA: Diagnosis not present

## 2017-02-22 DIAGNOSIS — K7469 Other cirrhosis of liver: Secondary | ICD-10-CM | POA: Diagnosis present

## 2017-02-22 DIAGNOSIS — G629 Polyneuropathy, unspecified: Secondary | ICD-10-CM | POA: Diagnosis present

## 2017-02-22 DIAGNOSIS — E785 Hyperlipidemia, unspecified: Secondary | ICD-10-CM | POA: Diagnosis present

## 2017-02-22 DIAGNOSIS — K625 Hemorrhage of anus and rectum: Secondary | ICD-10-CM | POA: Diagnosis present

## 2017-02-22 DIAGNOSIS — Z743 Need for continuous supervision: Secondary | ICD-10-CM | POA: Diagnosis not present

## 2017-02-22 DIAGNOSIS — J111 Influenza due to unidentified influenza virus with other respiratory manifestations: Secondary | ICD-10-CM | POA: Diagnosis not present

## 2017-02-22 DIAGNOSIS — R161 Splenomegaly, not elsewhere classified: Secondary | ICD-10-CM | POA: Diagnosis not present

## 2017-02-22 DIAGNOSIS — K644 Residual hemorrhoidal skin tags: Secondary | ICD-10-CM | POA: Diagnosis present

## 2017-02-22 DIAGNOSIS — I482 Chronic atrial fibrillation: Secondary | ICD-10-CM | POA: Diagnosis present

## 2017-02-22 DIAGNOSIS — K766 Portal hypertension: Secondary | ICD-10-CM | POA: Diagnosis not present

## 2017-02-22 DIAGNOSIS — I481 Persistent atrial fibrillation: Secondary | ICD-10-CM | POA: Diagnosis present

## 2017-02-22 DIAGNOSIS — K219 Gastro-esophageal reflux disease without esophagitis: Secondary | ICD-10-CM | POA: Diagnosis present

## 2017-02-22 DIAGNOSIS — J101 Influenza due to other identified influenza virus with other respiratory manifestations: Secondary | ICD-10-CM | POA: Insufficient documentation

## 2017-02-22 DIAGNOSIS — R279 Unspecified lack of coordination: Secondary | ICD-10-CM | POA: Diagnosis not present

## 2017-02-22 DIAGNOSIS — I429 Cardiomyopathy, unspecified: Secondary | ICD-10-CM | POA: Diagnosis present

## 2017-02-22 DIAGNOSIS — D500 Iron deficiency anemia secondary to blood loss (chronic): Secondary | ICD-10-CM | POA: Diagnosis present

## 2017-02-22 DIAGNOSIS — Z881 Allergy status to other antibiotic agents status: Secondary | ICD-10-CM | POA: Diagnosis not present

## 2017-02-22 DIAGNOSIS — Z8719 Personal history of other diseases of the digestive system: Secondary | ICD-10-CM | POA: Diagnosis not present

## 2017-02-22 DIAGNOSIS — I4891 Unspecified atrial fibrillation: Secondary | ICD-10-CM | POA: Diagnosis not present

## 2017-02-22 DIAGNOSIS — D61818 Other pancytopenia: Secondary | ICD-10-CM | POA: Diagnosis present

## 2017-02-22 DIAGNOSIS — I110 Hypertensive heart disease with heart failure: Secondary | ICD-10-CM | POA: Diagnosis present

## 2017-02-22 DIAGNOSIS — J209 Acute bronchitis, unspecified: Secondary | ICD-10-CM | POA: Diagnosis not present

## 2017-02-22 DIAGNOSIS — I454 Nonspecific intraventricular block: Secondary | ICD-10-CM | POA: Diagnosis not present

## 2017-02-22 DIAGNOSIS — Z66 Do not resuscitate: Secondary | ICD-10-CM | POA: Diagnosis present

## 2017-02-22 DIAGNOSIS — Z9981 Dependence on supplemental oxygen: Secondary | ICD-10-CM | POA: Diagnosis not present

## 2017-02-22 DIAGNOSIS — K7581 Nonalcoholic steatohepatitis (NASH): Secondary | ICD-10-CM | POA: Diagnosis present

## 2017-02-22 DIAGNOSIS — F329 Major depressive disorder, single episode, unspecified: Secondary | ICD-10-CM | POA: Diagnosis present

## 2017-02-22 DIAGNOSIS — I5022 Chronic systolic (congestive) heart failure: Secondary | ICD-10-CM | POA: Diagnosis present

## 2017-02-22 DIAGNOSIS — Z9889 Other specified postprocedural states: Secondary | ICD-10-CM | POA: Diagnosis not present

## 2017-02-22 DIAGNOSIS — K5900 Constipation, unspecified: Secondary | ICD-10-CM | POA: Diagnosis present

## 2017-02-22 DIAGNOSIS — I2510 Atherosclerotic heart disease of native coronary artery without angina pectoris: Secondary | ICD-10-CM | POA: Diagnosis present

## 2017-02-22 DIAGNOSIS — I252 Old myocardial infarction: Secondary | ICD-10-CM | POA: Diagnosis not present

## 2017-02-22 DIAGNOSIS — E669 Obesity, unspecified: Secondary | ICD-10-CM | POA: Diagnosis present

## 2017-02-22 DIAGNOSIS — J40 Bronchitis, not specified as acute or chronic: Secondary | ICD-10-CM | POA: Diagnosis present

## 2017-02-22 DIAGNOSIS — Z886 Allergy status to analgesic agent status: Secondary | ICD-10-CM | POA: Diagnosis not present

## 2017-02-22 DIAGNOSIS — D509 Iron deficiency anemia, unspecified: Secondary | ICD-10-CM | POA: Diagnosis not present

## 2017-02-22 DIAGNOSIS — J208 Acute bronchitis due to other specified organisms: Secondary | ICD-10-CM | POA: Diagnosis not present

## 2017-02-22 DIAGNOSIS — J09X2 Influenza due to identified novel influenza A virus with other respiratory manifestations: Secondary | ICD-10-CM | POA: Diagnosis not present

## 2017-02-22 DIAGNOSIS — I8500 Esophageal varices without bleeding: Secondary | ICD-10-CM | POA: Diagnosis not present

## 2017-02-22 DIAGNOSIS — D696 Thrombocytopenia, unspecified: Secondary | ICD-10-CM | POA: Diagnosis not present

## 2017-02-22 LAB — PROTIME-INR

## 2017-02-22 NOTE — Progress Notes (Signed)
Chart reviewed by Dr. Burt Knack. Upon his review he replied "With cirrhosis and varices I would not refer her for ICD because of her noncardiac prognosis. I think it's probably best to be conservative, realizing she will probably not do well with that long term. I don't think there's much we can do to help her other than tweaking meds. Her liver disease is pretty advanced based on my discussion with Dr Laural Golden. This will be relayed to patient via result note on recent labs. Dayna Dunn PA-C

## 2017-02-24 ENCOUNTER — Telehealth: Payer: Self-pay | Admitting: *Deleted

## 2017-02-24 MED ORDER — EZETIMIBE 10 MG PO TABS
10.00 | ORAL_TABLET | ORAL | Status: DC
Start: 2017-02-25 — End: 2017-02-24

## 2017-02-24 MED ORDER — CARVEDILOL 3.125 MG PO TABS
6.25 | ORAL_TABLET | ORAL | Status: DC
Start: 2017-02-24 — End: 2017-02-24

## 2017-02-24 MED ORDER — BENZONATATE 100 MG PO CAPS
100.00 | ORAL_CAPSULE | ORAL | Status: DC
Start: ? — End: 2017-02-24

## 2017-02-24 MED ORDER — CETIRIZINE HCL 10 MG PO TABS
10.00 | ORAL_TABLET | ORAL | Status: DC
Start: 2017-02-25 — End: 2017-02-24

## 2017-02-24 MED ORDER — FUROSEMIDE 40 MG PO TABS: 40.0000 mg | ORAL_TABLET | Freq: Every day | ORAL | 3 refills | Status: DC

## 2017-02-24 MED ORDER — FLUOXETINE HCL 20 MG PO CAPS
40.00 | ORAL_CAPSULE | ORAL | Status: DC
Start: 2017-02-25 — End: 2017-02-24

## 2017-02-24 MED ORDER — IPRATROPIUM-ALBUTEROL 0.5-2.5 (3) MG/3ML IN SOLN
3.00 | RESPIRATORY_TRACT | Status: DC
Start: ? — End: 2017-02-24

## 2017-02-24 MED ORDER — ONDANSETRON HCL 4 MG/2ML IJ SOLN
4.00 | INTRAMUSCULAR | Status: DC
Start: ? — End: 2017-02-24

## 2017-02-24 MED ORDER — PANTOPRAZOLE SODIUM 40 MG PO TBEC
40.00 | DELAYED_RELEASE_TABLET | ORAL | Status: DC
Start: 2017-02-25 — End: 2017-02-24

## 2017-02-24 MED ORDER — HYDROCODONE-ACETAMINOPHEN 5-325 MG PO TABS
1.00 | ORAL_TABLET | ORAL | Status: DC
Start: ? — End: 2017-02-24

## 2017-02-24 MED ORDER — PRAVASTATIN SODIUM 10 MG PO TABS
20.00 | ORAL_TABLET | ORAL | Status: DC
Start: 2017-02-24 — End: 2017-02-24

## 2017-02-24 MED ORDER — OSELTAMIVIR PHOSPHATE 30 MG PO CAPS
30.00 | ORAL_CAPSULE | ORAL | Status: DC
Start: 2017-02-24 — End: 2017-02-24

## 2017-02-24 MED ORDER — GENERIC EXTERNAL MEDICATION
2.00 | Status: DC
Start: ? — End: 2017-02-24

## 2017-02-24 NOTE — Telephone Encounter (Signed)
-----   Message from Charlie Pitter, [place] sent at 02/24/2017  3:30 PM EST ----- Reviewed available records in [place] summary is not available let but appears BP was borderline low 104/50s which was similar to prior BP. Cr appeared stable, 0.80. D/w Dr Burt Knack who advised restarting Lasix 40mg  daily and spironolactone 12.5mg  daily. Please have her f/u in a week to recheck .Dayna Dunn PA-C

## 2017-03-03 ENCOUNTER — Encounter: Payer: Self-pay | Admitting: Physician Assistant

## 2017-03-03 ENCOUNTER — Ambulatory Visit (INDEPENDENT_AMBULATORY_CARE_PROVIDER_SITE_OTHER): Payer: Medicare Other | Admitting: Physician Assistant

## 2017-03-03 VITALS — BP 132/78 | HR 96 | Ht 62.0 in | Wt 207.0 lb

## 2017-03-03 DIAGNOSIS — I4819 Other persistent atrial fibrillation: Secondary | ICD-10-CM

## 2017-03-03 DIAGNOSIS — I10 Essential (primary) hypertension: Secondary | ICD-10-CM

## 2017-03-03 DIAGNOSIS — I2510 Atherosclerotic heart disease of native coronary artery without angina pectoris: Secondary | ICD-10-CM

## 2017-03-03 DIAGNOSIS — I481 Persistent atrial fibrillation: Secondary | ICD-10-CM

## 2017-03-03 DIAGNOSIS — Z8719 Personal history of other diseases of the digestive system: Secondary | ICD-10-CM | POA: Diagnosis not present

## 2017-03-03 DIAGNOSIS — I5023 Acute on chronic systolic (congestive) heart failure: Secondary | ICD-10-CM | POA: Diagnosis not present

## 2017-03-03 DIAGNOSIS — K7460 Unspecified cirrhosis of liver: Secondary | ICD-10-CM

## 2017-03-03 NOTE — Progress Notes (Signed)
Cardiology Office Note:    Date:  03/03/2017   ID:  Samantha [name], DOB October 03, 1950, MRN [id number]  PCP:  [name], MD  Cardiologist:  Sherren Mocha, MD   Referring MD: [name], MD   Chief Complaint  Patient presents with  . Hospitalization Follow-up    admitted to [facility] with ?flu, rectal bleeding    History of Present Illness:    Samantha [name] is a 67 y.o. female with a hx of CAD status post CABG in 2001 and multiple PCI procedures, chronic angina, nonalcoholic cirrhosis with esophageal varices (clopidogrel stopped in the past secondary to varices requiring banding), chronic systolic heart failure, diabetes, atrial flutter, persistent atrial fibrillation, hypertension, hyperlipidemia.  She has not felt to be a candidate for anticoagulation secondary to cirrhosis and history of esophageal varices.  Ejection fraction has been as low as 25-35%.  Echo in November 2018 demonstrated somewhat improved LV function with an EF of 30-35.  She is not felt to be a candidate for advanced therapies for heart failure.  Dr. Burt Knack has discussed her case with gastroenterology and the patient continues to not be a candidate for anticoagulation.  She was last seen in clinic by Melina Copa, PA-C.  Potassium was somewhat elevated and her spironolactone dose was reduced.  She was admitted to [facility] last week of December 2018 after being transferred from [facility] with rectal bleeding and nausea in the setting of influenza A.  She was seen by GI.  Her hemoglobin remained stable.  She did not require intervention.  She was treated with Tamiflu for 5 days.  No discharge summary is available.  Our office contacted her after discharge and had her resume Lasix at 40 mg daily and spondylectomy 12.5 mg daily.  She returns today for follow-up.  She had been off of her diuretics for about 4 days.  Since resuming, she has increased the dose on her own to 80 mg daily.  She continues to  have facial swelling and some lower extremity edema.  She also notes some rattling in her chest.  She sleeps on 2 pillows chronically.  She denies PND.  She denies syncope.  She has stable anginal symptoms without change.  She has not taken nitroglycerin recently.  Prior CV studies:   The following studies were reviewed today:  Echo 01/25/17 Mild LVH, EF 30-35, inferior/inferolateral and anterolateral HK, MAC, mild LAE, moderately reduced RVSF, mild RAE, PASP 27  Cardiac catheterization 08/15/16 LAD D1 ostial 65 LCx ostial stent patent with 40 ISR RCA proximal 95 LIMA-OM 2 occluded SVG-AM. patent SVG-D1 patent  Past Medical History:  Diagnosis Date  . Chronic systolic CHF (congestive heart failure) ([place])   . Cirrhosis of liver ([place])   . Coronary artery disease    a. s/p CABG 2001 w/ (LIMA-OM, SVG-D1, SVG-RCA). b. h/o multiple PCIs per Dr. Antionette Char note.  . Depression   . Esophageal varices ([place])    New 2013  . Gastroesophageal reflux disease   . History of pneumonia   . Hyperlipidemia   . Hypertension   . Obesity   . Osteoarthritis   . Paroxysmal atrial flutter ([place])    a. dx 05/2016.  [name] Persistent atrial fibrillation ([place])    a. reported in hosp 07/2016, not on anticoag due to cirrhosis and liver disease, low platelets, varices.  . Thrombocytopenia ([place])     Past Surgical History:  Procedure Laterality Date  . ABDOMINAL HYSTERECTOMY    .  BACK SURGERY    . CARDIAC CATHETERIZATION  2004   left internal mammary artery to the obtuse marginal  was found to be small and thread like.  The two grafts were patent.  The left circumflex had 90% in-stent restenosis and cutting balloon angioplasty was performed followed by placement of a 3.0 x 34m Taxus drug -eluting stent.    .Marland KitchenCARDIAC CATHETERIZATION  2006   There was in-stent restenosis in the left circumflex and this was treated with cutting balloon angioplasty   . CARDIAC CATHETERIZATION  2008   vein graft to the to the obtuse  marginal was patent, although small, left circumflex had 40% in-stent restenosis, ejection fraction 40-45%.  The patient was medically mananged.  .Marland KitchenCARDIAC CATHETERIZATION N/A 01/09/2015   Procedure: Left Heart Cath and Cors/Grafts Angiography;  Surgeon: HBelva Crome MD;  [place];  Service: Cardiovascular;  Laterality: N/A;  . COLONOSCOPY  03/29/2011   Procedure: COLONOSCOPY;  Surgeon: MJamesetta So MD;  [facility];  Service: Gastroenterology;  Laterality: N/A;  . CORONARY ARTERY BYPASS GRAFT  May 31,2001   x 3 with a vein graft to the first diagonal, vein graft to the right coronary  artery, and a free left internal mammary  artery to the obtuse marginal   . ESOPHAGEAL BANDING N/A 07/04/2012   Procedure: ESOPHAGEAL BANDING;  Surgeon: NRogene Houston MD;  [facility];  Service: Endoscopy;  Laterality: N/A;  . ESOPHAGEAL BANDING N/A 09/17/2012   Procedure: ESOPHAGEAL BANDING;  Surgeon: NRogene Houston MD;  [facility];  Service: Endoscopy;  Laterality: N/A;  . ESOPHAGEAL BANDING N/A 10/22/2013   Procedure: ESOPHAGEAL BANDING;  Surgeon: NRogene Houston MD;  [facility];  Service: Endoscopy;  Laterality: N/A;  . ESOPHAGEAL BANDING N/A 11/28/2014   Procedure: ESOPHAGEAL BANDING;  Surgeon: NRogene Houston MD;  [facility];  Service: Endoscopy;  Laterality: N/A;  . ESOPHAGEAL BANDING N/A 10/29/2015   Procedure: ESOPHAGEAL BANDING;  Surgeon: NRogene Houston MD;  [facility];  Service: Endoscopy;  Laterality: N/A;  . ESOPHAGOGASTRODUODENOSCOPY  12/20/2011   Procedure: ESOPHAGOGASTRODUODENOSCOPY (EGD);  Surgeon: MJamesetta So MD;  [facility];  Service: Gastroenterology;  Laterality: N/A;  . ESOPHAGOGASTRODUODENOSCOPY N/A 07/04/2012   Procedure: ESOPHAGOGASTRODUODENOSCOPY (EGD);  Surgeon: NRogene Houston MD;  [facility];  Service: Endoscopy;  Laterality: N/A;  235-moved to 255 Ann to  notify pt  . ESOPHAGOGASTRODUODENOSCOPY N/A 09/17/2012   Procedure: ESOPHAGOGASTRODUODENOSCOPY (EGD);  Surgeon: NRogene Houston MD;  [facility];  Service: Endoscopy;  Laterality: N/A;  730  . ESOPHAGOGASTRODUODENOSCOPY N/A 10/22/2013   Procedure: ESOPHAGOGASTRODUODENOSCOPY (EGD);  Surgeon: NRogene Houston MD;  [facility];  Service: Endoscopy;  Laterality: N/A;  730  . ESOPHAGOGASTRODUODENOSCOPY N/A 11/28/2014   Procedure: ESOPHAGOGASTRODUODENOSCOPY (EGD);  Surgeon: NRogene Houston MD;  [facility];  Service: Endoscopy;  Laterality: N/A;  1:25  . ESOPHAGOGASTRODUODENOSCOPY N/A 10/29/2015   Procedure: ESOPHAGOGASTRODUODENOSCOPY (EGD);  Surgeon: NRogene Houston MD;  [facility];  Service: Endoscopy;  Laterality: N/A;  12:00  . ESOPHAGOGASTRODUODENOSCOPY N/A 12/12/2016   Procedure: ESOPHAGOGASTRODUODENOSCOPY (EGD);  Surgeon: RRogene Houston MD;  [facility];  Service: Endoscopy;  Laterality: N/A;  225  . JOINT REPLACEMENT    . LEFT HEART CATH AND CORS/GRAFTS ANGIOGRAPHY N/A 08/15/2016   Procedure: Left Heart Cath and Cors/Grafts Angiography;  Surgeon:  Jettie Booze, MD;  Location: [place] [facility];  Service: Cardiovascular;  Laterality: N/A;  . LEFT HEART CATHETERIZATION WITH CORONARY/GRAFT ANGIOGRAM N/A 12/25/2013   Procedure: LEFT HEART CATHETERIZATION WITH Beatrix Fetters;  Surgeon: Blane Ohara, MD;  Location: [facility] C[facility] LAB;  Service: Cardiovascular;  Laterality: N/A;  . rotator cuff left  2007  . TONSILLECTOMY      Current Medications: Current Meds  Medication Sig  . albuterol (PROVENTIL HFA;VENTOLIN HFA) 108 (90 BASE) MCG/ACT inhaler Inhale 1-2 puffs into the lungs every 6 (six) hours as needed for wheezing or shortness of breath.  [name] aspirin (ASPIRIN EC) 81 MG EC tablet Take 81 mg by mouth daily. Swallow whole.  [name] atorvastatin (LIPITOR) 20 MG tablet Take 20 mg by mouth every evening.   . cetirizine (ZYRTEC)  10 MG tablet Take 10 mg by mouth daily as needed for allergies.   [name] ezetimibe (ZETIA) 10 MG tablet Take 10 mg by mouth at bedtime.   [name] FLUoxetine (PROZAC) 40 MG capsule Take 40 mg by mouth every other day.   . fluticasone (FLONASE) 50 MCG/ACT nasal spray Place 2 sprays into both nostrils daily as needed for allergies.  . furosemide (LASIX) 40 MG tablet Take 1 tablet (40 mg total) by mouth daily. (Patient taking differently: Take 80 mg by mouth daily. )  . HYDROcodone-acetaminophen (NORCO) 10-325 MG tablet Take 1 tablet by mouth every 6 (six) hours as needed (for pain.).   [name] ipratropium-albuterol (DUONEB) 0.5-2.5 (3) MG/3ML SOLN Use as directed  . isosorbide mononitrate (IMDUR) 60 MG 24 hr tablet TAKE ONE TABLET BY MOUTH ONCE DAILY  . metoprolol succinate (TOPROL-XL) 50 MG 24 hr tablet Take 1 tablet (50 mg total) by mouth daily. Take with or immediately following a meal.  . NITROSTAT 0.4 MG SL tablet Place 0.4 mg under the tongue every 5 (five) minutes as needed for chest pain (x 3 tabs daily).   . pantoprazole (PROTONIX) 40 MG tablet Take 1 tablet (40 mg total) by mouth 2 (two) times daily.  . Polyethylene Glycol 3350 (PEG 3350) POWD Take 17 g by mouth daily.  [name] spironolactone (ALDACTONE) 25 MG tablet 1/2 tablet (12.4m ) by mouth daily  . valsartan (DIOVAN) 160 MG tablet Take 1 tablet (160 mg total) by mouth daily.     Allergies:   Acetaminophen; Oxycodone; Ace inhibitors; Cefaclor; Cephalexin; Penicillins; Pregabalin; and Tape   Social History   Tobacco Use  . Smoking status: Former Smoker    Packs/day: 0.50    Years: 20.00    Pack years: 10.00    Types: Cigarettes    Last attempt to quit: 07/21/1995    Years since quitting: 21.6  . Smokeless tobacco: Never Used  Substance Use Topics  . Alcohol use: No  . Drug use: No     Family Hx: The patient's family history includes Coronary artery disease in her sister; Heart attack in her mother; Heart attack (age of onset: 712 in her  father. There is no history of Colon cancer.  ROS:   Please see the history of present illness.    Review of Systems  Constitution: Positive for malaise/fatigue.  Cardiovascular: Positive for chest pain, irregular heartbeat and leg swelling.  Respiratory: Positive for cough and shortness of breath.    All other systems reviewed and are negative.   EKGs/Labs/Other Test Reviewed:    EKG:  EKG is  ordered today.  The ekg ordered today demonstrates atrial fibrillation, HR 96, rightward axis,  IVCD, nonspecific ST-T wave changes, QTC 515 ms, no significant change when compared to prior tracing  Recent Labs: 08/14/2016: B Natriuretic Peptide 104.0 08/16/2016: Hemoglobin 10.6; Platelets 50 02/14/2017: ALT 15 02/20/2017: BUN 25; Creatinine, Ser 1.29; Potassium 4.8; Sodium 143   Recent Lipid Panel Lab Results  Component Value Date/Time   CHOL 143 08/15/2016 03:09 AM   TRIG 50 08/15/2016 03:09 AM   HDL 65 08/15/2016 03:09 AM   CHOLHDL 2.2 08/15/2016 03:09 AM   LDLCALC 68 08/15/2016 03:09 AM    Physical Exam:    VS:  BP 132/78   Pulse 96   Ht _0  (1.575 m)   Wt 207 lb (93.9 kg)   LMP 03/29/2011   BMI 37.86 kg/m     Wt Readings from Last 3 Encounters:  03/03/17 207 lb (93.9 kg)  02/14/17 208 lb 12.8 oz (94.7 kg)  02/08/17 200 lb (90.7 kg)     Physical Exam  Constitutional: She is oriented to person, place, and time. She appears well-developed and well-nourished. No distress.  HENT:  Head: Normocephalic and atraumatic.  Mild periorbital edema noted  Eyes: No scleral icterus.  Neck: JVD (JVP 9 cm) present.  Cardiovascular: Normal rate. An irregularly irregular rhythm present.  No murmur heard. Pulmonary/Chest: She has no wheezes. She has no rales.  Abdominal: Soft.  Musculoskeletal: She exhibits edema (trace-1+ bilat edema).  Neurological: She is alert and oriented to person, place, and time.  Skin: Skin is warm and dry.    ASSESSMENT:    1. Acute on chronic systolic  CHF (congestive heart failure) (HCC)   2. Persistent atrial fibrillation ([place])   3. Coronary artery disease involving native coronary artery of native heart without angina pectoris   4. Essential hypertension   5. Cirrhosis, non-alcoholic ([place])   6. History of lower GI bleeding    PLAN:    In order of problems listed above:  1. Acute on chronic systolic CHF (congestive heart failure) ([place]) -  She had a recent admit with questionable influenza and rectal bleeding.  Her diuretics were held for a period of time and she is now back on Lasix 80 mg Once daily.  She still is somewhat volume overloaded.  It is not clear what her "dry weight" is as she does not weigh daily.    -BMET today  -increase Lasix to 80 mg in AM and 40 mg in PM x 3 days  -BMET 1 week  -follow up in 2 weeks  -continue higher dose lasix after 3 days if no improvement and call our office  2. Persistent atrial fibrillation (HCC)  Rate controlled.  She is not a candidate for anticoagulation.    3. Coronary artery disease involving native coronary artery of native heart with angina pectoris History of CABG and multiple PCI procedures.  Last cardiac catheterization in 6/18 with 2 out of 3 bypass grafts patent.  She has stable angina.  Continue current medical regimen.  4. Essential hypertension The patient's blood pressure is controlled on her current regimen.  Continue current therapy.    5. Cirrhosis, non-alcoholic ([place]) She has follow up with GI next week.  6. History of lower GI bleeding No recurrent bleeding noted.   Dispo:  Return in about 2 weeks (around 03/17/2017) for Close Follow Up, w/ Dr. Burt Knack, or Richardson Dopp, PA-C.   Medication Adjustments/Labs and Tests Ordered: Current medicines are reviewed at length with the patient today.  Concerns regarding medicines are outlined above.  Tests  Ordered: Orders Placed This Encounter  Procedures  . Basic Metabolic Panel (BMET)  . EKG 12-Lead   Medication  Changes: No orders of the defined types were placed in this encounter.   Signed, Richardson Dopp, PA-C  03/03/2017 1:35 PM    [facility], [place], [place]  [id number] Phone: [phone]; Fax: [phone]

## 2017-03-03 NOTE — Patient Instructions (Addendum)
Medication Instructions:  1. FOR 3 DAYS WE ARE GOING TO INCREASE LASIX TO 80 MG IN THE MORNING AND 40 MG IN THE PM; AFTER 3 DAYS YOU WILL GO BACK TO YOUR DOSE OF LASIX 80 MG IN THE IN THE MORNING;   HOWEVER IF AFTER THE 3 DAYS YOU ARE STILL NOT FEELING BETTER YOU ARE TO CONTINUE ON LASIX 80 MG IN THE AM AND 40 MG IN THE PM AND CALL OUR OFFICE [phone]  Labwork: 1. TODAY BMET  2. YOU HAVE BEEN GIVEN AN RX FOR LAB WORK TO BE DONE WITH YOUR PRIMARY CARE IN 1 WEEK; PLEASE MAKE SURE RESULTS ARE FAXED TO [place], [facility] [phone]  Testing/Procedures: NONE ORDERED TODAY  Follow-Up: 03/22/17 @ 2:45 WITH SCOTT WEAVER, PAC   Any Other Special Instructions Will Be Listed Below (If Applicable).     If you need a refill on your cardiac medications before your next appointment, please call your pharmacy.

## 2017-03-04 DIAGNOSIS — Z6837 Body mass index (BMI) 37.0-37.9, adult: Secondary | ICD-10-CM | POA: Diagnosis not present

## 2017-03-04 DIAGNOSIS — E1140 Type 2 diabetes mellitus with diabetic neuropathy, unspecified: Secondary | ICD-10-CM | POA: Diagnosis not present

## 2017-03-04 DIAGNOSIS — K922 Gastrointestinal hemorrhage, unspecified: Secondary | ICD-10-CM | POA: Diagnosis not present

## 2017-03-04 DIAGNOSIS — R600 Localized edema: Secondary | ICD-10-CM | POA: Diagnosis not present

## 2017-03-04 DIAGNOSIS — D509 Iron deficiency anemia, unspecified: Secondary | ICD-10-CM | POA: Diagnosis not present

## 2017-03-04 DIAGNOSIS — D61818 Other pancytopenia: Secondary | ICD-10-CM | POA: Diagnosis not present

## 2017-03-04 LAB — BASIC METABOLIC PANEL
BUN/Creatinine Ratio: 11 — ABNORMAL LOW (ref 12–28)
BUN: 10 mg/dL (ref 8–27)
CO2: 27 mmol/L (ref 20–29)
Calcium: 9.1 mg/dL (ref 8.7–10.3)
Chloride: 103 mmol/L (ref 96–106)
Creatinine, Ser: 0.93 mg/dL (ref 0.57–1.00)
GFR calc Af Amer: 73 mL/min/{1.73_m2} (ref >59)
GFR calc non Af Amer: 63 mL/min/{1.73_m2} (ref >59)
Glucose: 84 mg/dL (ref 65–99)
Potassium: 3.9 mmol/L (ref 3.5–5.2)
Sodium: 145 mmol/L — ABNORMAL HIGH (ref 134–144)

## 2017-03-06 ENCOUNTER — Ambulatory Visit (INDEPENDENT_AMBULATORY_CARE_PROVIDER_SITE_OTHER): Payer: Medicare Other | Admitting: Internal Medicine

## 2017-03-06 ENCOUNTER — Encounter (INDEPENDENT_AMBULATORY_CARE_PROVIDER_SITE_OTHER): Payer: Self-pay | Admitting: Internal Medicine

## 2017-03-06 ENCOUNTER — Encounter (INDEPENDENT_AMBULATORY_CARE_PROVIDER_SITE_OTHER): Payer: Self-pay | Admitting: *Deleted

## 2017-03-06 VITALS — BP 140/82 | HR 80 | Temp 97.2°F | Ht 62.0 in | Wt 208.7 lb

## 2017-03-06 DIAGNOSIS — I2510 Atherosclerotic heart disease of native coronary artery without angina pectoris: Secondary | ICD-10-CM

## 2017-03-06 DIAGNOSIS — K6289 Other specified diseases of anus and rectum: Secondary | ICD-10-CM | POA: Insufficient documentation

## 2017-03-06 MED ORDER — HYDROCORTISONE 2.5 % RE CREA: 1.0000 "application " | TOPICAL_CREAM | Freq: Two times a day (BID) | RECTAL | 1 refills | Status: DC

## 2017-03-06 NOTE — Progress Notes (Signed)
Subjective:    Patient ID: Samantha [name], female    DOB: 1951-01-26, 67 y.o.   MRN: [id number]  HPI Here today for f/u after recent admission to [facility] for rectal bleeding. Transferred from  [facility] ED. In the ED at [facility], she was told she may have an anal fissure.  She had also had rectal pain. She says she was having pain with each BM,  The pain was terrible. She says she was not constipated.   She was evaluated buy GI at [facility] and they recommended a flex sigmoidoscopy on an outpatient basis. [name] She was admitted to [facility] x 3 days.    Hemoglobin remained stable at 9.5-10.0. She says she had a significant amt of rectal bleeding.  Her last colonoscopy was in 2013 by Dr. Arnoldo Morale (screening): diverticula in the sigmoid colon. She tells me she has seen a small amt of blood since discharge from [facility].  She was given an Rx for Miralax by Dr. Quintin Alto.  02/24/2017 9.6, 12/216/2018 10.0   Hx of atrial fib Maintained ASA Hx of CHF Hx of Esophageal varices Hx of NAFLD. Underwent and EGD in October of 2018: Impression:               - Normal proximal esophagus and mid esophagus.                           - Grade I and grade II esophageal varices. varices                            were t large enough to be banded. Varices not large                            enough to be banded.                           - Scar in the distal esophagus.                           - Z-line regular, 38 cm from the incisors.                           - Portal hypertensive gastropathy.                           - Type 1 gastroesophageal varices (GOV1, esophageal                            varices which extend along the lesser curvature),                            without bleeding.                           - Normal duodenal bulb and second portion of the                            duodenum.                           -  No specimens collected.      Review of Systems Past Medical History:  Diagnosis Date    . Chronic systolic CHF (congestive heart failure) ([place])   . Cirrhosis of liver ([place])   . Coronary artery disease    a. s/p CABG 2001 w/ (LIMA-OM, SVG-D1, SVG-RCA). b. h/o multiple PCIs per Dr. Antionette Char note.  . Depression   . Esophageal varices ([place])    New 2013  . Gastroesophageal reflux disease   . History of pneumonia   . Hyperlipidemia   . Hypertension   . Obesity   . Osteoarthritis   . Paroxysmal atrial flutter ([place])    a. dx 05/2016.  [name] Persistent atrial fibrillation ([place])    a. reported in hosp 07/2016, not on anticoag due to cirrhosis and liver disease, low platelets, varices.  . Thrombocytopenia ([place])     Past Surgical History:  Procedure Laterality Date  . ABDOMINAL HYSTERECTOMY    . BACK SURGERY    . CARDIAC CATHETERIZATION  2004   left internal mammary artery to the obtuse marginal  was found to be small and thread like.  The two grafts were patent.  The left circumflex had 90% in-stent restenosis and cutting balloon angioplasty was performed followed by placement of a 3.0 x 28mm Taxus drug -eluting stent.    [name] CARDIAC CATHETERIZATION  2006   There was in-stent restenosis in the left circumflex and this was treated with cutting balloon angioplasty   . CARDIAC CATHETERIZATION  2008   vein graft to the to the obtuse marginal was patent, although small, left circumflex had 40% in-stent restenosis, ejection fraction 40-45%.  The patient was medically mananged.  [name] CARDIAC CATHETERIZATION N/A 01/09/2015   Procedure: Left Heart Cath and Cors/Grafts Angiography;  Surgeon: Belva Crome, MD;  Location: [place] [facility];  Service: Cardiovascular;  Laterality: N/A;  . COLONOSCOPY  03/29/2011   Procedure: COLONOSCOPY;  Surgeon: Jamesetta So, MD;  [facility];  Service: Gastroenterology;  Laterality: N/A;  . CORONARY ARTERY BYPASS GRAFT  May 31,2001   x 3 with a vein graft to the first diagonal, vein graft to the right coronary  artery, and a free left internal  mammary  artery to the obtuse marginal   . ESOPHAGEAL BANDING N/A 07/04/2012   Procedure: ESOPHAGEAL BANDING;  Surgeon: Rogene Houston, MD;  [facility];  Service: Endoscopy;  Laterality: N/A;  . ESOPHAGEAL BANDING N/A 09/17/2012   Procedure: ESOPHAGEAL BANDING;  Surgeon: Rogene Houston, MD;  [facility];  Service: Endoscopy;  Laterality: N/A;  . ESOPHAGEAL BANDING N/A 10/22/2013   Procedure: ESOPHAGEAL BANDING;  Surgeon: Rogene Houston, MD;  [facility];  Service: Endoscopy;  Laterality: N/A;  . ESOPHAGEAL BANDING N/A 11/28/2014   Procedure: ESOPHAGEAL BANDING;  Surgeon: Rogene Houston, MD;  [facility];  Service: Endoscopy;  Laterality: N/A;  . ESOPHAGEAL BANDING N/A 10/29/2015   Procedure: ESOPHAGEAL BANDING;  Surgeon: Rogene Houston, MD;  [facility];  Service: Endoscopy;  Laterality: N/A;  . ESOPHAGOGASTRODUODENOSCOPY  12/20/2011   Procedure: ESOPHAGOGASTRODUODENOSCOPY (EGD);  Surgeon: Jamesetta So, MD;  [facility];  Service: Gastroenterology;  Laterality: N/A;  . ESOPHAGOGASTRODUODENOSCOPY N/A 07/04/2012   Procedure: ESOPHAGOGASTRODUODENOSCOPY (EGD);  Surgeon: Rogene Houston, MD;  [facility];  Service: Endoscopy;  Laterality: N/A;  235-moved to 255 Ann to notify pt  . ESOPHAGOGASTRODUODENOSCOPY N/A 09/17/2012   Procedure: ESOPHAGOGASTRODUODENOSCOPY (EGD);  Surgeon: Rogene Houston, MD;  [facility];  Service: Endoscopy;  Laterality: N/A;  730  . ESOPHAGOGASTRODUODENOSCOPY N/A 10/22/2013   Procedure: ESOPHAGOGASTRODUODENOSCOPY (EGD);  Surgeon: Rogene Houston, MD;  [facility];  Service: Endoscopy;  Laterality: N/A;  730  . ESOPHAGOGASTRODUODENOSCOPY N/A 11/28/2014   Procedure: ESOPHAGOGASTRODUODENOSCOPY (EGD);  Surgeon: Rogene Houston, MD;  [facility];  Service: Endoscopy;  Laterality: N/A;  1:25  . ESOPHAGOGASTRODUODENOSCOPY N/A 10/29/2015   Procedure:  ESOPHAGOGASTRODUODENOSCOPY (EGD);  Surgeon: Rogene Houston, MD;  [facility];  Service: Endoscopy;  Laterality: N/A;  12:00  . ESOPHAGOGASTRODUODENOSCOPY N/A 12/12/2016   Procedure: ESOPHAGOGASTRODUODENOSCOPY (EGD);  Surgeon: Rogene Houston, MD;  [facility];  Service: Endoscopy;  Laterality: N/A;  225  . JOINT REPLACEMENT    . LEFT HEART CATH AND CORS/GRAFTS ANGIOGRAPHY N/A 08/15/2016   Procedure: Left Heart Cath and Cors/Grafts Angiography;  Surgeon: Jettie Booze, MD;  Location: [place] [facility];  Service: Cardiovascular;  Laterality: N/A;  . LEFT HEART CATHETERIZATION WITH CORONARY/GRAFT ANGIOGRAM N/A 12/25/2013   Procedure: LEFT HEART CATHETERIZATION WITH Beatrix Fetters;  Surgeon: Blane Ohara, MD;  Location: [facility] CATH LAB;  Service: Cardiovascular;  Laterality: N/A;  . rotator cuff left  2007  . TONSILLECTOMY      Allergies  Allergen Reactions  . Acetaminophen Other (See Comments)    Liver problems  . Oxycodone Itching and Nausea Only  . Ace Inhibitors Other (See Comments)    UNSURE OF REACTION TYPE  . Cefaclor Itching and Rash  . Cephalexin Itching and Rash  . Penicillins Rash    Has patient had a PCN reaction causing immediate rash, facial/tongue/throat swelling, SOB or lightheadedness with hypotension: YES Has patient had a PCN reaction causing severe rash involving mucus membranes or skin necrosis: NO Has patient had a PCN reaction that required hospitalization: YES Has patient had a PCN reaction occurring within the last 10 years: NO If all of the above answers are "NO", then may proceed with Cephalosporin use.   . Pregabalin Other (See Comments)    Retains fluid  . Tape Itching and Rash    Takes skin right off with medical tape--PAPER TAPE ONLY    Current Outpatient Medications on File Prior to Visit  Medication Sig Dispense Refill  . albuterol (PROVENTIL HFA;VENTOLIN HFA) 108 (90 BASE) MCG/ACT inhaler Inhale 1-2 puffs  into the lungs every 6 (six) hours as needed for wheezing or shortness of breath.    [name] aspirin (ASPIRIN EC) 81 MG EC tablet Take 81 mg by mouth daily. Swallow whole.    [name] atorvastatin (LIPITOR) 20 MG tablet Take 20 mg by mouth every evening.     . ezetimibe (ZETIA) 10 MG tablet Take 10 mg by mouth at bedtime.     [name] FLUoxetine (PROZAC) 40 MG capsule Take 40 mg by mouth every other day.     . fluticasone (FLONASE) 50 MCG/ACT nasal spray Place 2 sprays into both nostrils daily as needed for allergies.    . furosemide (LASIX) 40 MG tablet Take 1 tablet (40 mg total) by mouth daily. (Patient taking differently: Take 80 mg by mouth daily. ) 90 tablet 3  . HYDROcodone-acetaminophen (NORCO) 10-325 MG tablet Take 1 tablet by mouth every 6 (six) hours as needed (for pain.).     [name] metoprolol succinate (TOPROL-XL) 50 MG 24 hr tablet Take 1 tablet (50 mg total) by mouth daily. Take with or immediately following  a meal. 30 tablet 11  . NITROSTAT 0.4 MG SL tablet Place 0.4 mg under the tongue every 5 (five) minutes as needed for chest pain (x 3 tabs daily).     . pantoprazole (PROTONIX) 40 MG tablet Take 1 tablet (40 mg total) by mouth 2 (two) times daily. 180 tablet 2  . polyethylene glycol (MIRALAX / GLYCOLAX) packet Take 17 g by mouth daily.    [name] spironolactone (ALDACTONE) 25 MG tablet 1/2 tablet (12.5mg  ) by mouth daily    . valsartan (DIOVAN) 160 MG tablet Take 1 tablet (160 mg total) by mouth daily. 30 tablet 11  . cetirizine (ZYRTEC) 10 MG tablet Take 10 mg by mouth daily as needed for allergies.     [name] ipratropium-albuterol (DUONEB) 0.5-2.5 (3) MG/3ML SOLN Use as directed    . isosorbide mononitrate (IMDUR) 60 MG 24 hr tablet TAKE ONE TABLET BY MOUTH ONCE DAILY (Patient not taking: Reported on 03/06/2017) 30 tablet 7  . Polyethylene Glycol 3350 (PEG 3350) POWD Take 17 g by mouth daily.     No current facility-administered medications on file prior to visit.         Objective:   Physical Exam Blood  pressure 140/82, pulse 80, temperature (!) 97.2 F (36.2 C), height 5\' 2"  (1.575 m), weight 208 lb 11.2 oz (94.7 kg), last menstrual period 03/29/2011. Alert and oriented. Skin warm and dry. Oral mucosa is moist.   . Sclera anicteric, conjunctivae is pink. Thyroid not enlarged. No cervical lymphadenopathy. Lungs clear. Heart regular rate and rhythm.  Abdomen is soft. Bowel sounds are positive. No hepatomegaly. No abdominal masses felt. No tenderness.  No edema to lower extremities.  Stool brown guaiac negative.           Assessment & Plan:  Rectal bleeding.  Flex Sigmoid.  Rx for Proctozone sent to [facility].  Continue the MIralax.  Will get labs and reports from [facility].

## 2017-03-06 NOTE — Patient Instructions (Signed)
Flex sigmoid. The risks of bleeding, perforation and infection were reviewed with patient.

## 2017-03-10 DIAGNOSIS — E782 Mixed hyperlipidemia: Secondary | ICD-10-CM | POA: Diagnosis not present

## 2017-03-10 DIAGNOSIS — I10 Essential (primary) hypertension: Secondary | ICD-10-CM | POA: Diagnosis not present

## 2017-03-10 DIAGNOSIS — D509 Iron deficiency anemia, unspecified: Secondary | ICD-10-CM | POA: Diagnosis not present

## 2017-03-10 DIAGNOSIS — E7800 Pure hypercholesterolemia, unspecified: Secondary | ICD-10-CM | POA: Diagnosis not present

## 2017-03-20 ENCOUNTER — Encounter (HOSPITAL_COMMUNITY)
Admission: RE | Disposition: A | Discharge status: Home / Self Care | Payer: Self-pay | Source: Ambulatory Visit | Attending: Internal Medicine

## 2017-03-20 ENCOUNTER — Ambulatory Visit (HOSPITAL_COMMUNITY)
Admission: RE | Admit: 2017-03-20 | Discharge: 2017-03-20 | Disposition: A | Discharge status: Home / Self Care | Payer: Medicare Other | Source: Ambulatory Visit | Attending: Internal Medicine | Admitting: Internal Medicine

## 2017-03-20 ENCOUNTER — Other Ambulatory Visit: Payer: Self-pay

## 2017-03-20 ENCOUNTER — Encounter (HOSPITAL_COMMUNITY): Payer: Self-pay

## 2017-03-20 DIAGNOSIS — Z951 Presence of aortocoronary bypass graft: Secondary | ICD-10-CM | POA: Insufficient documentation

## 2017-03-20 DIAGNOSIS — Z886 Allergy status to analgesic agent status: Secondary | ICD-10-CM | POA: Insufficient documentation

## 2017-03-20 DIAGNOSIS — K6289 Other specified diseases of anus and rectum: Secondary | ICD-10-CM

## 2017-03-20 DIAGNOSIS — I5022 Chronic systolic (congestive) heart failure: Secondary | ICD-10-CM | POA: Diagnosis not present

## 2017-03-20 DIAGNOSIS — K219 Gastro-esophageal reflux disease without esophagitis: Secondary | ICD-10-CM | POA: Insufficient documentation

## 2017-03-20 DIAGNOSIS — Z79899 Other long term (current) drug therapy: Secondary | ICD-10-CM | POA: Insufficient documentation

## 2017-03-20 DIAGNOSIS — Z888 Allergy status to other drugs, medicaments and biological substances status: Secondary | ICD-10-CM | POA: Diagnosis not present

## 2017-03-20 DIAGNOSIS — I4892 Unspecified atrial flutter: Secondary | ICD-10-CM | POA: Diagnosis not present

## 2017-03-20 DIAGNOSIS — K625 Hemorrhage of anus and rectum: Secondary | ICD-10-CM | POA: Diagnosis not present

## 2017-03-20 DIAGNOSIS — Z7982 Long term (current) use of aspirin: Secondary | ICD-10-CM | POA: Diagnosis not present

## 2017-03-20 DIAGNOSIS — M1990 Unspecified osteoarthritis, unspecified site: Secondary | ICD-10-CM | POA: Insufficient documentation

## 2017-03-20 DIAGNOSIS — Z885 Allergy status to narcotic agent status: Secondary | ICD-10-CM | POA: Diagnosis not present

## 2017-03-20 DIAGNOSIS — F329 Major depressive disorder, single episode, unspecified: Secondary | ICD-10-CM | POA: Insufficient documentation

## 2017-03-20 DIAGNOSIS — Z880 Allergy status to penicillin: Secondary | ICD-10-CM | POA: Insufficient documentation

## 2017-03-20 DIAGNOSIS — E785 Hyperlipidemia, unspecified: Secondary | ICD-10-CM | POA: Diagnosis not present

## 2017-03-20 DIAGNOSIS — I110 Hypertensive heart disease with heart failure: Secondary | ICD-10-CM | POA: Diagnosis not present

## 2017-03-20 DIAGNOSIS — I2510 Atherosclerotic heart disease of native coronary artery without angina pectoris: Secondary | ICD-10-CM | POA: Insufficient documentation

## 2017-03-20 DIAGNOSIS — I481 Persistent atrial fibrillation: Secondary | ICD-10-CM | POA: Insufficient documentation

## 2017-03-20 DIAGNOSIS — K644 Residual hemorrhoidal skin tags: Secondary | ICD-10-CM | POA: Diagnosis not present

## 2017-03-20 DIAGNOSIS — K7460 Unspecified cirrhosis of liver: Secondary | ICD-10-CM | POA: Insufficient documentation

## 2017-03-20 DIAGNOSIS — K7581 Nonalcoholic steatohepatitis (NASH): Secondary | ICD-10-CM | POA: Insufficient documentation

## 2017-03-20 HISTORY — PX: FLEXIBLE SIGMOIDOSCOPY: SHX5431

## 2017-03-20 SURGERY — SIGMOIDOSCOPY, FLEXIBLE
Anesthesia: Moderate Sedation

## 2017-03-20 MED ORDER — SODIUM CHLORIDE 0.9 % IV SOLN
INTRAVENOUS | Status: DC
  Administered 2017-03-20: 08:00:00 via INTRAVENOUS

## 2017-03-20 MED ORDER — MEPERIDINE HCL 50 MG/ML IJ SOLN
INTRAMUSCULAR | Status: AC
  Filled 2017-03-20: qty 1

## 2017-03-20 MED ORDER — LIDOCAINE HCL 2 % EX GEL
CUTANEOUS | Status: DC | PRN
  Administered 2017-03-20: 1

## 2017-03-20 MED ORDER — MIDAZOLAM HCL 5 MG/5ML IJ SOLN
INTRAMUSCULAR | Status: AC
  Filled 2017-03-20: qty 10

## 2017-03-20 MED ORDER — LIDOCAINE HCL 2 % EX GEL
CUTANEOUS | Status: AC
  Filled 2017-03-20: qty 30

## 2017-03-20 MED ORDER — MIDAZOLAM HCL 5 MG/5ML IJ SOLN
INTRAMUSCULAR | Status: DC | PRN
  Administered 2017-03-20 (×4): 2 mg via INTRAVENOUS

## 2017-03-20 MED ORDER — MEPERIDINE HCL 25 MG/ML IJ SOLN
INTRAMUSCULAR | Status: DC | PRN
  Administered 2017-03-20 (×2): 25 mg via INTRAVENOUS

## 2017-03-20 NOTE — Discharge Instructions (Signed)
Resume usual medications including aspirin but do not take ibuprofen or Naprosyn. Resume usual diet. No driving for 24 hours. Reports to the emergency room if you experience rectal bleeding.   Flexible Sigmoidoscopy, Care After This sheet gives you information about how to care for yourself after your procedure. Your health care provider may also give you more specific instructions. If you have problems or questions, contact your health care provider.   Dr Laural Golden [phone] What can I expect after the procedure? After the procedure, it is common to have:  Abdominal cramping or pain.  Bloating.  A small amount of rectal bleeding if you had a biopsy.  Follow these instructions at home:  Take over-the-counter and prescription medicines only as told by your health care provider.  Do not drive for 24 hours if you received a medicine to help you relax (sedative).  Keep all follow-up visits as told by your health care provider. This is important. Contact a health care provider if:  You have abdominal pain or cramping that gets worse or is not helped with medicine.  You continue to have small amounts of rectal bleeding after 24 hours.  You have nausea or vomiting.  You feel weak or dizzy.  You have a fever. Get help right away if:  You pass large blood clots or see a large amount of blood in the toilet after having a bowel movement.  You have nausea or vomiting for more than 24 hours after the procedure. This information is not intended to replace advice given to you by your health care provider. Make sure you discuss any questions you have with your health care provider. Document Released: 02/19/2013 Document Revised: 09/04/2015 Document Reviewed: 05/16/2015 Elsevier Interactive Patient Education  [employer].

## 2017-03-20 NOTE — H&P (Signed)
Samantha [name] is an 67 y.o. female.   Chief Complaint: Patient is here for flexible sigmoidoscopy. HPI: Patient is 67 year old Caucasian female who has cirrhosis secondary to [name] and history of esophageal and gastric varices who was last EGD was on 12/12/2016 revealing small varices and nonbleeding gastric varices began to pass bright red blood per rectum on Christmas day.  She was evaluated at [facility] in [place].  She had CAT scan.  CT scan revealed splenomegaly gastric varices.  Patient was transferred to [facility] for further therapy.  Patient stopped bleeding.  She tested positive for influenza H1 N1.  Therefore procedure was canceled.  She was treated with Tamiflu.  Patient states she never had any symptoms of flu which she has had in the past.  Patient was discharged and advised to undergo flexible sigmoidoscopy.  Physician felt she may have a fissure in ano or internal hemorrhoids. She has noted painful defecation.  Pain is decreased.  She did notice scant amount of blood yesterday after he took enema last evening. Hemoglobin on 02/24/2017 was 9.5 g.  Past Medical History:  Diagnosis Date  . Chronic systolic CHF (congestive heart failure) ([place])   . Cirrhosis of liver ([place])   . Coronary artery disease    a. s/p CABG 2001 w/ (LIMA-OM, SVG-D1, SVG-RCA). b. h/o multiple PCIs per Dr. Antionette Char note.  . Depression   . Esophageal varices ([place])    New 2013  . Gastroesophageal reflux disease   . History of pneumonia   . Hyperlipidemia   . Hypertension   . Obesity   . Osteoarthritis   . Paroxysmal atrial flutter ([place])    a. dx 05/2016.  [name] Persistent atrial fibrillation ([place])    a. reported in hosp 07/2016, not on anticoag due to cirrhosis and liver disease, low platelets, varices.  . Thrombocytopenia ([place])     Past Surgical History:  Procedure Laterality Date  . ABDOMINAL HYSTERECTOMY    . BACK SURGERY    . CARDIAC CATHETERIZATION  2004   left internal mammary artery to  the obtuse marginal  was found to be small and thread like.  The two grafts were patent.  The left circumflex had 90% in-stent restenosis and cutting balloon angioplasty was performed followed by placement of a 3.0 x 74m Taxus drug -eluting stent.    .Marland KitchenCARDIAC CATHETERIZATION  2006   There was in-stent restenosis in the left circumflex and this was treated with cutting balloon angioplasty   . CARDIAC CATHETERIZATION  2008   vein graft to the to the obtuse marginal was patent, although small, left circumflex had 40% in-stent restenosis, ejection fraction 40-45%.  The patient was medically mananged.  .Marland KitchenCARDIAC CATHETERIZATION N/A 01/09/2015   Procedure: Left Heart Cath and Cors/Grafts Angiography;  Surgeon: HBelva Crome MD;  [facility];  Service: Cardiovascular;  Laterality: N/A;  . COLONOSCOPY  03/29/2011   Procedure: COLONOSCOPY;  Surgeon: MJamesetta So MD;  [facility];  Service: Gastroenterology;  Laterality: N/A;  . CORONARY ARTERY BYPASS GRAFT  May 31,2001   x 3 with a vein graft to the first diagonal, vein graft to the right coronary  artery, and a free left internal mammary  artery to the obtuse marginal   . ESOPHAGEAL BANDING N/A 07/04/2012   Procedure: ESOPHAGEAL BANDING;  Surgeon: NRogene Houston MD;  [facility];  Service: Endoscopy;  Laterality: N/A;  . ESOPHAGEAL BANDING N/A 09/17/2012  Procedure: ESOPHAGEAL BANDING;  Surgeon: Rogene Houston, MD;  [facility];  Service: Endoscopy;  Laterality: N/A;  . ESOPHAGEAL BANDING N/A 10/22/2013   Procedure: ESOPHAGEAL BANDING;  Surgeon: Rogene Houston, MD;  [facility];  Service: Endoscopy;  Laterality: N/A;  . ESOPHAGEAL BANDING N/A 11/28/2014   Procedure: ESOPHAGEAL BANDING;  Surgeon: Rogene Houston, MD;  [facility];  Service: Endoscopy;  Laterality: N/A;  . ESOPHAGEAL BANDING N/A 10/29/2015   Procedure: ESOPHAGEAL BANDING;  Surgeon: Rogene Houston, MD;   [facility];  Service: Endoscopy;  Laterality: N/A;  . ESOPHAGOGASTRODUODENOSCOPY  12/20/2011   Procedure: ESOPHAGOGASTRODUODENOSCOPY (EGD);  Surgeon: Jamesetta So, MD;  [facility];  Service: Gastroenterology;  Laterality: N/A;  . ESOPHAGOGASTRODUODENOSCOPY N/A 07/04/2012   Procedure: ESOPHAGOGASTRODUODENOSCOPY (EGD);  Surgeon: Rogene Houston, MD;  [facility];  Service: Endoscopy;  Laterality: N/A;  235-moved to 255 Ann to notify pt  . ESOPHAGOGASTRODUODENOSCOPY N/A 09/17/2012   Procedure: ESOPHAGOGASTRODUODENOSCOPY (EGD);  Surgeon: Rogene Houston, MD;  [facility];  Service: Endoscopy;  Laterality: N/A;  730  . ESOPHAGOGASTRODUODENOSCOPY N/A 10/22/2013   Procedure: ESOPHAGOGASTRODUODENOSCOPY (EGD);  Surgeon: Rogene Houston, MD;  [facility];  Service: Endoscopy;  Laterality: N/A;  730  . ESOPHAGOGASTRODUODENOSCOPY N/A 11/28/2014   Procedure: ESOPHAGOGASTRODUODENOSCOPY (EGD);  Surgeon: Rogene Houston, MD;  [facility];  Service: Endoscopy;  Laterality: N/A;  1:25  . ESOPHAGOGASTRODUODENOSCOPY N/A 10/29/2015   Procedure: ESOPHAGOGASTRODUODENOSCOPY (EGD);  Surgeon: Rogene Houston, MD;  [facility];  Service: Endoscopy;  Laterality: N/A;  12:00  . ESOPHAGOGASTRODUODENOSCOPY N/A 12/12/2016   Procedure: ESOPHAGOGASTRODUODENOSCOPY (EGD);  Surgeon: Rogene Houston, MD;  [facility];  Service: Endoscopy;  Laterality: N/A;  225  . JOINT REPLACEMENT    . LEFT HEART CATH AND CORS/GRAFTS ANGIOGRAPHY N/A 08/15/2016   Procedure: Left Heart Cath and Cors/Grafts Angiography;  Surgeon: Jettie Booze, MD;  Location: [place] [facility];  Service: Cardiovascular;  Laterality: N/A;  . LEFT HEART CATHETERIZATION WITH CORONARY/GRAFT ANGIOGRAM N/A 12/25/2013   Procedure: LEFT HEART CATHETERIZATION WITH Beatrix Fetters;  Surgeon: Blane Ohara, MD;  Location: [facility] CATH LAB;  Service: Cardiovascular;   Laterality: N/A;  . rotator cuff left  2007  . TONSILLECTOMY      Family History  Problem Relation Age of Onset  . Heart attack Mother   . Heart attack Father 7       cause of death  . Coronary artery disease Sister        CABG   . Colon cancer Neg Hx    Social History:  reports that she quit smoking about 21 years ago. Her smoking use included cigarettes. She has a 10.00 pack-year smoking history. she has never used smokeless tobacco. She reports that she does not drink alcohol or use drugs.  Allergies:  Allergies  Allergen Reactions  . Acetaminophen Other (See Comments)    Liver problems  . Oxycodone Itching and Nausea Only  . Ace Inhibitors Other (See Comments)    UNSURE OF REACTION TYPE  . Cefaclor Itching and Rash  . Cephalexin Itching and Rash  . Penicillins Rash    Has patient had a PCN reaction causing immediate rash, facial/tongue/throat swelling, SOB or lightheadedness with hypotension: YES Has patient had a PCN reaction causing severe rash involving mucus membranes or skin necrosis: NO Has patient had a PCN reaction that required  hospitalization: YES Has patient had a PCN reaction occurring within the last 10 years: NO If all of the above answers are "NO", then may proceed with Cephalosporin use.   . Pregabalin Other (See Comments)    Retains fluid  . Tape Itching and Rash    Takes skin right off with medical tape--PAPER TAPE ONLY    Medications Prior to Admission  Medication Sig Dispense Refill  . albuterol (PROVENTIL HFA;VENTOLIN HFA) 108 (90 BASE) MCG/ACT inhaler Inhale 2 puffs into the lungs every 6 (six) hours as needed for wheezing or shortness of breath.     [name] aspirin (ASPIRIN EC) 81 MG EC tablet Take 81 mg by mouth daily. Swallow whole.    [name] atorvastatin (LIPITOR) 20 MG tablet Take 20 mg by mouth every evening.     . cetirizine (ZYRTEC) 10 MG tablet Take 10 mg by mouth daily as needed for allergies (during allergy season).     . ezetimibe (ZETIA) 10 MG  tablet Take 10 mg by mouth at bedtime.     [name] FLUoxetine (PROZAC) 40 MG capsule Take 40 mg by mouth daily.     . fluticasone (FLONASE) 50 MCG/ACT nasal spray Place 1 spray into both nostrils daily as needed for allergies.     . furosemide (LASIX) 40 MG tablet Take 1 tablet (40 mg total) by mouth daily. (Patient taking differently: Take 80 mg by mouth daily. ) 90 tablet 3  . HYDROcodone-acetaminophen (NORCO) 10-325 MG tablet Take 1 tablet by mouth every 6 (six) hours as needed (for pain.).     [name] hydrocortisone (ANUSOL-HC) 2.5 % rectal cream Place 1 application rectally 2 (two) times daily. (Patient taking differently: Place 1 application rectally 2 (two) times daily as needed for hemorrhoids. ) 30 g 1  . ibuprofen (ADVIL,MOTRIN) 200 MG tablet Take 400 mg by mouth every 6 (six) hours as needed for headache.    . isosorbide mononitrate (IMDUR) 60 MG 24 hr tablet TAKE ONE TABLET BY MOUTH ONCE DAILY 30 tablet 7  . metoprolol succinate (TOPROL-XL) 50 MG 24 hr tablet Take 1 tablet (50 mg total) by mouth daily. Take with or immediately following a meal. 30 tablet 11  . pantoprazole (PROTONIX) 40 MG tablet Take 1 tablet (40 mg total) by mouth 2 (two) times daily. 180 tablet 2  . polyethylene glycol (MIRALAX / GLYCOLAX) packet Take 17 g by mouth daily.    [name] spironolactone (ALDACTONE) 25 MG tablet Take 12.5 mg by mouth daily. 1/2 tablet (12.69m ) by mouth daily    . valsartan (DIOVAN) 160 MG tablet Take 1 tablet (160 mg total) by mouth daily. 30 tablet 11  . NITROSTAT 0.4 MG SL tablet Place 0.4 mg under the tongue every 5 (five) minutes as needed for chest pain (x 3 tabs daily).       No results found for this or any previous visit (from the past 48 hour(s)). No results found.  ROS  Blood pressure 137/76, pulse 82, temperature 98.4 F (36.9 C), temperature source Oral, resp. rate 11, last menstrual period 03/29/2011, SpO2 100 %. Physical Exam  Constitutional: She appears well-developed and  well-nourished.  HENT:  Mouth/Throat: Oropharynx is clear and moist.  Eyes: Conjunctivae are normal. No scleral icterus.  Neck: No thyromegaly present.  Cardiovascular: Normal rate, regular rhythm and normal heart sounds.  Respiratory: Effort normal and breath sounds normal.  GI:  Abdomen is full.  Soft and nontender.  Spleen tip is palpable.  Musculoskeletal: She exhibits edema.  Trace edema around the ankles.  Lymphadenopathy:    She has no cervical adenopathy.  Neurological: She is alert.  Skin: Skin is warm and dry.     Assessment/Plan History of rectal bleeding and painful defecation. Cirrhosis and history of esophageal and gastric varices. Diagnostic flexible sigmoidoscopy.  Hildred Laser, MD 03/20/2017, 9:31 AM

## 2017-03-20 NOTE — Op Note (Signed)
[facility] Patient Name: Samantha [name] Procedure Date: 03/20/2017 8:59 AM MRN: [id number] Date of Birth: 08-Jul-1950 Attending MD: Hildred Laser , MD CSN: [id number] Age: 67 Admit Type: Outpatient Procedure:                Flexible Sigmoidoscopy Indications:              Rectal hemorrhage Providers:                Hildred Laser, MD, Lurline Del, RN, Nelma Rothman,                            Technician Referring MD:             [name], MD Medicines:                2% Lidocaine jelly to anal, Meperidine 50 mg IV and                            Midazolam 8 mg IV Complications:            No immediate complications. Estimated Blood Loss:     Estimated blood loss: none. Procedure:                Pre-Anesthesia Assessment:                           - Prior to the procedure, a History and Physical                            was performed, and patient medications and                            allergies were reviewed. The patient's tolerance of                            previous anesthesia was also reviewed. The risks                            and benefits of the procedure and the sedation                            options and risks were discussed with the patient.                            All questions were answered, and informed consent                            was obtained. Prior Anticoagulants: The patient                            last took aspirin 3 days prior to the procedure.                            ASA Grade Assessment: III - A patient with severe  systemic disease. After reviewing the risks and                            benefits, the patient was deemed in satisfactory                            condition to undergo the procedure.                           After obtaining informed consent, the scope was                            passed under direct vision. The was introduced                            through the anus and advanced to the  the sigmoid                            colon. The flexible sigmoidoscopy was accomplished                            without difficulty. The patient tolerated the                            procedure well. The quality of the bowel                            preparation was fair. Scope In: 9:46:27 AM Scope Out: 9:51:58 AM Total Procedure Duration: 0 hours 5 minutes 31 seconds  Findings:      The perianal examination was normal.      The digital rectal exam was normal.      The rectum and sigmoid colon appeared normal.      External hemorrhoids were found during retroflexion. The hemorrhoids       were small. Impression:               - Preparation of the colon was fair.                           - The rectum and sigmoid colon are normal.                           - External hemorrhoids.                           - No specimens collected. Moderate Sedation:      Moderate (conscious) sedation was administered by the endoscopy nurse       and supervised by the endoscopist. The following parameters were       monitored: oxygen saturation, heart rate, blood pressure, CO2       capnography and response to care. Total physician intraservice time was       13 minutes. Recommendation:           - Discharge patient to home (with escort).                           -  Resume previous diet today.                           - Continue present medications.                           - Resume aspirin at prior dose today.                           - No NSAIDs. Procedure Code(s):        --- Professional ---                           [phone], Sigmoidoscopy, flexible; diagnostic,                            including collection of specimen(s) by brushing or                            washing, when performed (separate procedure)                           99152, Moderate sedation services provided by the                            same physician or other qualified health care                             professional performing the diagnostic or                            therapeutic service that the sedation supports,                            requiring the presence of an independent trained                            observer to assist in the monitoring of the                            patient's level of consciousness and physiological                            status; initial 15 minutes of intraservice time,                            patient age 55 years or older Diagnosis Code(s):        --- Professional ---                           K64.4, Residual hemorrhoidal skin tags                           K62.5, Hemorrhage of anus and rectum CPT copyright 2016 American Medical Association. All rights reserved. The codes documented in this report are preliminary and upon  coder review may  be revised to meet current compliance requirements. Hildred Laser, MD Hildred Laser, MD 03/20/2017 10:05:27 AM This report has been signed electronically. Number of Addenda: 0

## 2017-03-21 ENCOUNTER — Ambulatory Visit (INDEPENDENT_AMBULATORY_CARE_PROVIDER_SITE_OTHER): Payer: Medicare Other | Admitting: Internal Medicine

## 2017-03-21 ENCOUNTER — Encounter (HOSPITAL_COMMUNITY): Payer: Self-pay | Admitting: Internal Medicine

## 2017-03-21 DIAGNOSIS — E7800 Pure hypercholesterolemia, unspecified: Secondary | ICD-10-CM | POA: Diagnosis not present

## 2017-03-21 DIAGNOSIS — I10 Essential (primary) hypertension: Secondary | ICD-10-CM | POA: Diagnosis not present

## 2017-03-21 DIAGNOSIS — D509 Iron deficiency anemia, unspecified: Secondary | ICD-10-CM | POA: Diagnosis not present

## 2017-03-21 DIAGNOSIS — E782 Mixed hyperlipidemia: Secondary | ICD-10-CM | POA: Diagnosis not present

## 2017-03-21 DIAGNOSIS — K7460 Unspecified cirrhosis of liver: Secondary | ICD-10-CM | POA: Diagnosis not present

## 2017-03-21 DIAGNOSIS — D696 Thrombocytopenia, unspecified: Secondary | ICD-10-CM | POA: Diagnosis not present

## 2017-03-21 DIAGNOSIS — E1140 Type 2 diabetes mellitus with diabetic neuropathy, unspecified: Secondary | ICD-10-CM | POA: Diagnosis not present

## 2017-03-21 DIAGNOSIS — K210 Gastro-esophageal reflux disease with esophagitis: Secondary | ICD-10-CM | POA: Diagnosis not present

## 2017-03-21 DIAGNOSIS — I959 Hypotension, unspecified: Secondary | ICD-10-CM | POA: Diagnosis not present

## 2017-03-22 ENCOUNTER — Encounter: Payer: Self-pay | Admitting: Physician Assistant

## 2017-03-22 ENCOUNTER — Ambulatory Visit (INDEPENDENT_AMBULATORY_CARE_PROVIDER_SITE_OTHER): Payer: Medicare Other | Admitting: Physician Assistant

## 2017-03-22 VITALS — BP 112/62 | HR 74 | Ht 62.0 in | Wt 208.0 lb

## 2017-03-22 DIAGNOSIS — I5023 Acute on chronic systolic (congestive) heart failure: Secondary | ICD-10-CM | POA: Diagnosis not present

## 2017-03-22 DIAGNOSIS — I481 Persistent atrial fibrillation: Secondary | ICD-10-CM

## 2017-03-22 DIAGNOSIS — I2510 Atherosclerotic heart disease of native coronary artery without angina pectoris: Secondary | ICD-10-CM

## 2017-03-22 DIAGNOSIS — I4819 Other persistent atrial fibrillation: Secondary | ICD-10-CM

## 2017-03-22 MED ORDER — SACUBITRIL-VALSARTAN 49-51 MG PO TABS: 1.0000 | ORAL_TABLET | Freq: Two times a day (BID) | ORAL | 3 refills | Status: DC

## 2017-03-22 MED ORDER — FUROSEMIDE 40 MG PO TABS: 40.0000 mg | ORAL_TABLET | ORAL | 3 refills | Status: DC

## 2017-03-22 NOTE — Progress Notes (Signed)
Cardiology Office Note:    Date:  03/22/2017   ID:  Carollyn, Etcheverry May 04, 1950, MRN [id number]  PCP:  [name], MD  Cardiologist:  Sherren Mocha, MD   Referring MD: [name], MD   Chief Complaint  Patient presents with  . Congestive Heart Failure    History of Present Illness:    EARTHA VONBEHREN is a 67 y.o. female with a hx of CAD status post CABG in 2001 and multiple PCI procedures, chronic angina, nonalcoholic cirrhosis with esophageal varices (clopidogrel stopped in the past secondary to varices requiring banding), chronic systolic heart failure, diabetes, atrial flutter, persistent atrial fibrillation, hypertension, hyperlipidemia.  She has not felt to be a candidate for anticoagulation secondary to cirrhosis and history of esophageal varices.  Ejection fraction has been as low as 25-35%.  Echo in November 2018 demonstrated somewhat improved LV function with an EF of 30-35.  She is not felt to be a candidate for advanced therapies for heart failure.  Dr. Burt Knack has discussed her case with gastroenterology and the patient continues to not be a candidate for anticoagulation.  She was admitted to [facility] in late December 2018 with rectal bleeding and suspected influenza.  She was last seen in this clinic 03/03/17.  I adjusted her Lasix for volume excess.  Ms. Haack returns for close follow-up on congestive heart failure.  She is here alone.  She did feel somewhat better with higher dose Lasix for several days.  However, since then, she has noted worsening facial swelling and breathing.  She has dyspnea with mild to moderate activities.  She also notes a nonproductive cough at times.  She has typical angina.  She occasionally gets chest pain relieved with nitroglycerin.  She denies any change in her anginal pattern.  She denies syncope.  She does note 2 pillow orthopnea as well as occasional PND.  Prior CV studies:   The following studies were reviewed today:  Echo  01/25/17 Mild LVH, EF 30-35, inferior/inferolateral and anterolateral HK, MAC, mild LAE, moderately reduced RVSF, mild RAE, PASP 27  Cardiac catheterization 08/15/16 LAD D1 ostial 65 LCx ostial stent patent with 40 ISR RCA proximal 95 LIMA-OM 2 occluded SVG-AM. patent SVG-D1 patent  Past Medical History:  Diagnosis Date  . Chronic systolic CHF (congestive heart failure) ([place])   . Cirrhosis of liver ([place])   . Coronary artery disease    a. s/p CABG 2001 w/ (LIMA-OM, SVG-D1, SVG-RCA). b. h/o multiple PCIs per Dr. Antionette Char note.  . Depression   . Esophageal varices ([place])    New 2013  . Gastroesophageal reflux disease   . History of pneumonia   . Hyperlipidemia   . Hypertension   . Obesity   . Osteoarthritis   . Paroxysmal atrial flutter ([place])    a. dx 05/2016.  [name] Persistent atrial fibrillation ([place])    a. reported in hosp 07/2016, not on anticoag due to cirrhosis and liver disease, low platelets, varices.  . Thrombocytopenia ([place])     Past Surgical History:  Procedure Laterality Date  . ABDOMINAL HYSTERECTOMY    . BACK SURGERY    . CARDIAC CATHETERIZATION  2004   left internal mammary artery to the obtuse marginal  was found to be small and thread like.  The two grafts were patent.  The left circumflex had 90% in-stent restenosis and cutting balloon angioplasty was performed followed by placement of a 3.0 x 62m Taxus drug -eluting stent.    .[name]  CARDIAC CATHETERIZATION  2006   There was in-stent restenosis in the left circumflex and this was treated with cutting balloon angioplasty   . CARDIAC CATHETERIZATION  2008   vein graft to the to the obtuse marginal was patent, although small, left circumflex had 40% in-stent restenosis, ejection fraction 40-45%.  The patient was medically mananged.  [name] CARDIAC CATHETERIZATION N/A 01/09/2015   Procedure: Left Heart Cath and Cors/Grafts Angiography;  Surgeon: Belva Crome, MD;  Location: [place] [facility];  Service: Cardiovascular;   Laterality: N/A;  . COLONOSCOPY  03/29/2011   Procedure: COLONOSCOPY;  Surgeon: Jamesetta So, MD;  [facility];  Service: Gastroenterology;  Laterality: N/A;  . CORONARY ARTERY BYPASS GRAFT  May 31,2001   x 3 with a vein graft to the first diagonal, vein graft to the right coronary  artery, and a free left internal mammary  artery to the obtuse marginal   . ESOPHAGEAL BANDING N/A 07/04/2012   Procedure: ESOPHAGEAL BANDING;  Surgeon: Rogene Houston, MD;  [facility];  Service: Endoscopy;  Laterality: N/A;  . ESOPHAGEAL BANDING N/A 09/17/2012   Procedure: ESOPHAGEAL BANDING;  Surgeon: Rogene Houston, MD;  [facility];  Service: Endoscopy;  Laterality: N/A;  . ESOPHAGEAL BANDING N/A 10/22/2013   Procedure: ESOPHAGEAL BANDING;  Surgeon: Rogene Houston, MD;  [facility];  Service: Endoscopy;  Laterality: N/A;  . ESOPHAGEAL BANDING N/A 11/28/2014   Procedure: ESOPHAGEAL BANDING;  Surgeon: Rogene Houston, MD;  [facility];  Service: Endoscopy;  Laterality: N/A;  . ESOPHAGEAL BANDING N/A 10/29/2015   Procedure: ESOPHAGEAL BANDING;  Surgeon: Rogene Houston, MD;  [facility];  Service: Endoscopy;  Laterality: N/A;  . ESOPHAGOGASTRODUODENOSCOPY  12/20/2011   Procedure: ESOPHAGOGASTRODUODENOSCOPY (EGD);  Surgeon: Jamesetta So, MD;  [facility];  Service: Gastroenterology;  Laterality: N/A;  . ESOPHAGOGASTRODUODENOSCOPY N/A 07/04/2012   Procedure: ESOPHAGOGASTRODUODENOSCOPY (EGD);  Surgeon: Rogene Houston, MD;  [facility];  Service: Endoscopy;  Laterality: N/A;  235-moved to 255 Ann to notify pt  . ESOPHAGOGASTRODUODENOSCOPY N/A 09/17/2012   Procedure: ESOPHAGOGASTRODUODENOSCOPY (EGD);  Surgeon: Rogene Houston, MD;  [facility];  Service: Endoscopy;  Laterality: N/A;  730  . ESOPHAGOGASTRODUODENOSCOPY N/A 10/22/2013   Procedure: ESOPHAGOGASTRODUODENOSCOPY (EGD);  Surgeon: Rogene Houston, MD;   [facility];  Service: Endoscopy;  Laterality: N/A;  730  . ESOPHAGOGASTRODUODENOSCOPY N/A 11/28/2014   Procedure: ESOPHAGOGASTRODUODENOSCOPY (EGD);  Surgeon: Rogene Houston, MD;  [facility];  Service: Endoscopy;  Laterality: N/A;  1:25  . ESOPHAGOGASTRODUODENOSCOPY N/A 10/29/2015   Procedure: ESOPHAGOGASTRODUODENOSCOPY (EGD);  Surgeon: Rogene Houston, MD;  [facility];  Service: Endoscopy;  Laterality: N/A;  12:00  . ESOPHAGOGASTRODUODENOSCOPY N/A 12/12/2016   Procedure: ESOPHAGOGASTRODUODENOSCOPY (EGD);  Surgeon: Rogene Houston, MD;  [facility];  Service: Endoscopy;  Laterality: N/A;  225  . FLEXIBLE SIGMOIDOSCOPY N/A 03/20/2017   Procedure: FLEXIBLE SIGMOIDOSCOPY;  Surgeon: Rogene Houston, MD;  [facility];  Service: Endoscopy;  Laterality: N/A;  7:30  . JOINT REPLACEMENT    . LEFT HEART CATH AND CORS/GRAFTS ANGIOGRAPHY N/A 08/15/2016   Procedure: Left Heart Cath and Cors/Grafts Angiography;  Surgeon: Jettie Booze, MD;  Location: [place] [facility];  Service: Cardiovascular;  Laterality: N/A;  . LEFT HEART CATHETERIZATION WITH CORONARY/GRAFT ANGIOGRAM N/A 12/25/2013   Procedure: LEFT HEART CATHETERIZATION WITH CORONARY/GRAFT ANGIOGRAM;  Surgeon: Blane Ohara, MD;  Location[facility] C[facility];  Service: Cardiovascular;  Laterality: N/A;  . rotator cuff left  2007  . TONSILLECTOMY      Current Medications: Current Meds  Medication Sig  . albuterol (PROVENTIL HFA;VENTOLIN HFA) 108 (90 BASE) MCG/ACT inhaler Inhale 2 puffs into the lungs every 6 (six) hours as needed for wheezing or shortness of breath.   [name] aspirin (ASPIRIN EC) 81 MG EC tablet Take 81 mg by mouth daily. Swallow whole.  [name] atorvastatin (LIPITOR) 20 MG tablet Take 20 mg by mouth every evening.   . cetirizine (ZYRTEC) 10 MG tablet Take 10 mg by mouth daily as needed for allergies (during allergy season).   . ezetimibe (ZETIA) 10 MG tablet Take 10 mg by mouth at  bedtime.   [name] FLUoxetine (PROZAC) 40 MG capsule Take 40 mg by mouth daily.   . fluticasone (FLONASE) 50 MCG/ACT nasal spray Place 1 spray into both nostrils daily as needed for allergies.   [name] HYDROcodone-acetaminophen (NORCO) 10-325 MG tablet Take 1 tablet by mouth every 6 (six) hours as needed (for pain.).   [name] hydrocortisone (ANUSOL-HC) 2.5 % rectal cream Place 1 application rectally 2 (two) times daily as needed for hemorrhoids or anal itching.  . isosorbide mononitrate (IMDUR) 60 MG 24 hr tablet TAKE ONE TABLET BY MOUTH ONCE DAILY  . metoprolol succinate (TOPROL-XL) 50 MG 24 hr tablet Take 1 tablet (50 mg total) by mouth daily. Take with or immediately following a meal.  . NITROSTAT 0.4 MG SL tablet Place 0.4 mg under the tongue every 5 (five) minutes as needed for chest pain (x 3 tabs daily).   . pantoprazole (PROTONIX) 40 MG tablet Take 1 tablet (40 mg total) by mouth 2 (two) times daily.  . polyethylene glycol (MIRALAX / GLYCOLAX) packet Take 17 g by mouth daily.  [name] spironolactone (ALDACTONE) 25 MG tablet Take 12.5 mg by mouth daily. 1/2 tablet (12.89m ) by mouth daily  . valsartan (DIOVAN) 160 MG tablet Take 1 tablet (160 mg total) by mouth daily.  . [DISCONTINUED] furosemide (LASIX) 40 MG tablet Take 40 mg by mouth 2 (two) times daily.     Allergies:   Acetaminophen; Oxycodone; Ace inhibitors; Cefaclor; Cephalexin; Penicillins; Pregabalin; and Tape   Social History   Tobacco Use  . Smoking status: Former Smoker    Packs/day: 0.50    Years: 20.00    Pack years: 10.00    Types: Cigarettes    Last attempt to quit: 07/21/1995    Years since quitting: 21.6  . Smokeless tobacco: Never Used  Substance Use Topics  . Alcohol use: No  . Drug use: No     Family Hx: The patient's family history includes Coronary artery disease in her sister; Heart attack in her mother; Heart attack (age of onset: 768 in her father. There is no history of Colon cancer.  ROS:   Please see the history of  present illness.    Review of Systems  Constitution: Positive for malaise/fatigue.  Cardiovascular: Positive for chest pain, irregular heartbeat and leg swelling.  Respiratory: Positive for cough, shortness of breath and wheezing.   Musculoskeletal: Positive for back pain and joint pain.  Neurological: Positive for dizziness.  Psychiatric/Behavioral: Positive for depression.   All other systems reviewed and are negative.   EKGs/Labs/Other Test Reviewed:    EKG:  EKG is not ordered today.    Recent Labs: 08/14/2016: B Natriuretic Peptide 104.0 08/16/2016: Hemoglobin 10.6; Platelets 50 02/14/2017:  ALT 15 03/03/2017: BUN 10; Creatinine, Ser 0.93; Potassium 3.9; Sodium 145   Recent Lipid Panel Lab Results  Component Value Date/Time   CHOL 143 08/15/2016 03:09 AM   TRIG 50 08/15/2016 03:09 AM   HDL 65 08/15/2016 03:09 AM   CHOLHDL 2.2 08/15/2016 03:09 AM   LDLCALC 68 08/15/2016 03:09 AM    Physical Exam:    VS:  BP 112/62   Pulse 74   Ht 5' 2"  (1.575 m)   Wt 208 lb (94.3 kg)   LMP 03/29/2011   SpO2 96%   BMI 38.04 kg/m     Wt Readings from Last 3 Encounters:  03/22/17 208 lb (94.3 kg)  03/06/17 208 lb 11.2 oz (94.7 kg)  03/03/17 207 lb (93.9 kg)     Physical Exam  Constitutional: She is oriented to person, place, and time. She appears well-developed and well-nourished. No distress.  HENT:  Head: Normocephalic and atraumatic.  Eyes: No scleral icterus.  Neck: JVD (JVP 7-8 cm) present.  Cardiovascular: Normal rate. An irregularly irregular rhythm present.  No murmur heard. Pulmonary/Chest: Effort normal. She has no rales.  Abdominal: Soft.  Musculoskeletal: She exhibits no edema.  Neurological: She is alert and oriented to person, place, and time.  Skin: Skin is warm and dry.    ASSESSMENT:    1. Acute on chronic systolic heart failure (HCC)   2. Persistent atrial fibrillation ([place])   3. Coronary artery disease involving native coronary artery of native heart  without angina pectoris    PLAN:    In order of problems listed above: 1.  Acute on chronic systolic heart failure (HCC) EF 30-35.  NYHA 3.  She continues to struggle with symptoms of worsening volume excess.  She needs further diuresis.  I also think she would benefit from the addition of Entresto.  -Continue current dose of metoprolol, nitrates, Spironolactone.  -Increase Lasix to 80 mg every morning/40 mg every afternoon times 3 days  -After 3 days:   -Decrease Lasix to 40 mg daily   -DC valsartan   -Start Entresto 49/51 mg twice daily   -BMET 2 weeks with PCP  2.  Persistent atrial fibrillation (HCC) Rate is controlled.  She has not a candidate for anticoagulation.  3.  Coronary artery disease involving native coronary artery of native heart with angina pectoris Status post CABG and multiple PCI procedures.  Cardiac catheterization in June 2018 with 2/3 bypass grafts patent.  She has stable angina without clear change in her pattern.  Continue aspirin, statin, Zetia, nitrates, beta-blocker.   Dispo:  Return in about 1 month (around 04/22/2017) for Close Follow Up, w/ Dr. Burt Knack.   Medication Adjustments/Labs and Tests Ordered: Current medicines are reviewed at length with the patient today.  Concerns regarding medicines are outlined above.  Tests Ordered: No orders of the defined types were placed in this encounter.  Medication Changes: Meds ordered this encounter  Medications  . sacubitril-valsartan (ENTRESTO) 49-51 MG    Sig: Take 1 tablet by mouth 2 (two) times daily.    Dispense:  180 tablet    Refill:  3    DO NOT START UNTIL AFTER YOU STOP THE VALSARTAN  . furosemide (LASIX) 40 MG tablet    Sig: Take 1 tablet (40 mg total) by mouth as directed. 80 mg AM/40 PM x 3 days; then decrease to 40 mg once a day    Dispense:  90 tablet    Refill:  3    Signed,  Richardson Dopp, PA-C  03/22/2017 5:10 PM    [facility] [address], [place], [place]   [address] Phone: [phone]; Fax: [phone]

## 2017-03-22 NOTE — Patient Instructions (Addendum)
Medication Instructions:  1. FOR 3 DAYS WE WILL HAVE YOU INCREASE LASIX TO 80 MG IN THE MORNING AND 40 MG IN THE PM; AFTER THE 3 DAYS YOU WILL DECREASE LASIX TO 40 MG 1 TABLET DAILY  2. IN 3 DAYS YOU WILL STOP THE VALSARTAN  3. THE NEXT DAY AFTER STOPPING THE VALSARTAN YOU WILL START ENTRESTO 49/51 MG 1 TABLET TWICE DAILY; RX HAS BEEN SENT IN AND YOU HAVE BEEN GIVEN SAMPLES.  Labwork: YOU HAVE BEEN GIVEN AN RX TO HAVE LAB WORK DONE WITH PRIMARY CARE IN 2 WEEKS; PLEASE HAVE THE RESULTS FAXED TO [facility], [place]  Testing/Procedures: NONE ORDERED TODAY  Follow-Up: DR. Burt Knack ON 04/28/17 @ 2:40   Any Other Special Instructions Will Be Listed Below (If Applicable).     If you need a refill on your cardiac medications before your next appointment, please call your pharmacy.

## 2017-03-23 DIAGNOSIS — I10 Essential (primary) hypertension: Secondary | ICD-10-CM | POA: Diagnosis not present

## 2017-03-23 DIAGNOSIS — D649 Anemia, unspecified: Secondary | ICD-10-CM | POA: Diagnosis not present

## 2017-03-23 DIAGNOSIS — D72819 Decreased white blood cell count, unspecified: Secondary | ICD-10-CM | POA: Diagnosis not present

## 2017-03-23 DIAGNOSIS — D696 Thrombocytopenia, unspecified: Secondary | ICD-10-CM | POA: Diagnosis not present

## 2017-03-23 DIAGNOSIS — Z6836 Body mass index (BMI) 36.0-36.9, adult: Secondary | ICD-10-CM | POA: Diagnosis not present

## 2017-03-23 DIAGNOSIS — K7460 Unspecified cirrhosis of liver: Secondary | ICD-10-CM | POA: Diagnosis not present

## 2017-03-23 DIAGNOSIS — I5023 Acute on chronic systolic (congestive) heart failure: Secondary | ICD-10-CM | POA: Diagnosis not present

## 2017-03-23 DIAGNOSIS — E119 Type 2 diabetes mellitus without complications: Secondary | ICD-10-CM | POA: Diagnosis not present

## 2017-03-26 DIAGNOSIS — R600 Localized edema: Secondary | ICD-10-CM | POA: Diagnosis not present

## 2017-03-26 DIAGNOSIS — D61818 Other pancytopenia: Secondary | ICD-10-CM | POA: Diagnosis not present

## 2017-03-26 DIAGNOSIS — E1140 Type 2 diabetes mellitus with diabetic neuropathy, unspecified: Secondary | ICD-10-CM | POA: Diagnosis not present

## 2017-03-26 DIAGNOSIS — Z6837 Body mass index (BMI) 37.0-37.9, adult: Secondary | ICD-10-CM | POA: Diagnosis not present

## 2017-03-26 DIAGNOSIS — D509 Iron deficiency anemia, unspecified: Secondary | ICD-10-CM | POA: Diagnosis not present

## 2017-03-26 DIAGNOSIS — K922 Gastrointestinal hemorrhage, unspecified: Secondary | ICD-10-CM | POA: Diagnosis not present

## 2017-03-27 ENCOUNTER — Telehealth: Payer: Self-pay

## 2017-03-27 NOTE — Telephone Encounter (Signed)
I have done an [place] PA through covermymeds.

## 2017-03-28 NOTE — Telephone Encounter (Signed)
Entresto PA approved per fax received from [facility]. Approval good until 03/27/2019.

## 2017-03-29 ENCOUNTER — Telehealth: Payer: Self-pay | Admitting: Cardiovascular Disease

## 2017-03-29 NOTE — Telephone Encounter (Signed)
New Message   Pt c/o medication issue:  1. Name of Medication: Entresto 2. How are you currently taking this medication (dosage and times per day)? 49-48m   3. Are you having a reaction (difficulty breathing--STAT)? no  4. What is your medication issue? Patient states that she was advised that [facility] to call back and request something else. Patient states that the EDelene Lollcomes up to be $600 and she can not afford that. Please call to discuss.

## 2017-03-31 MED ORDER — VALSARTAN 160 MG PO TABS: 160.0000 mg | ORAL_TABLET | Freq: Every day | ORAL | 11 refills | Status: DC

## 2017-03-31 NOTE — Telephone Encounter (Signed)
Left message to call back  

## 2017-03-31 NOTE — Telephone Encounter (Signed)
She should go back on valsartan at her previous dose. thx

## 2017-03-31 NOTE — Telephone Encounter (Signed)
Patient states even with approved PA, her Samantha [name] will still be $600 monthly. Instructed her to finish her Entreso samples then STOP. She will resume Valsartan 160 mg daily. She was grateful for call and agrees with treatment plan.

## 2017-04-04 DIAGNOSIS — S63591D Other specified sprain of right wrist, subsequent encounter: Secondary | ICD-10-CM | POA: Diagnosis not present

## 2017-04-04 DIAGNOSIS — M25431 Effusion, right wrist: Secondary | ICD-10-CM | POA: Diagnosis not present

## 2017-04-04 DIAGNOSIS — S52571D Other intraarticular fracture of lower end of right radius, subsequent encounter for closed fracture with routine healing: Secondary | ICD-10-CM | POA: Diagnosis not present

## 2017-04-05 DIAGNOSIS — G5601 Carpal tunnel syndrome, right upper limb: Secondary | ICD-10-CM | POA: Insufficient documentation

## 2017-04-06 DIAGNOSIS — I5022 Chronic systolic (congestive) heart failure: Secondary | ICD-10-CM | POA: Diagnosis not present

## 2017-04-11 ENCOUNTER — Ambulatory Visit (INDEPENDENT_AMBULATORY_CARE_PROVIDER_SITE_OTHER): Payer: Medicare Other | Admitting: Urology

## 2017-04-11 DIAGNOSIS — C679 Malignant neoplasm of bladder, unspecified: Secondary | ICD-10-CM

## 2017-04-25 ENCOUNTER — Encounter (INDEPENDENT_AMBULATORY_CARE_PROVIDER_SITE_OTHER): Payer: Self-pay | Admitting: Internal Medicine

## 2017-04-25 ENCOUNTER — Ambulatory Visit (INDEPENDENT_AMBULATORY_CARE_PROVIDER_SITE_OTHER): Payer: Medicare Other | Admitting: Internal Medicine

## 2017-04-25 VITALS — BP 124/80 | Wt 211.2 lb

## 2017-04-25 DIAGNOSIS — K219 Gastro-esophageal reflux disease without esophagitis: Secondary | ICD-10-CM | POA: Diagnosis not present

## 2017-04-25 DIAGNOSIS — K7460 Unspecified cirrhosis of liver: Secondary | ICD-10-CM

## 2017-04-25 DIAGNOSIS — D649 Anemia, unspecified: Secondary | ICD-10-CM | POA: Diagnosis not present

## 2017-04-25 DIAGNOSIS — I2510 Atherosclerotic heart disease of native coronary artery without angina pectoris: Secondary | ICD-10-CM | POA: Diagnosis not present

## 2017-04-25 NOTE — Progress Notes (Signed)
Presenting complaint;  Follow-up for chronic liver disease.  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  She is maintained on beta-blockers. Patient was evaluated at [facility] him in [facility] on Christmas day for rectal bleeding.  CT scan showed gastric varices and splenomegaly.  Patient was transferred to [facility] for further therapy.  Patient tested positive for influenza H1 and N1 therefore endoscopic evaluation was not undertaken.  She did not have any more bleeding.  Based on her history I did not feel that she bled from gastric varices.  She did not require blood transfusion. She underwent flexible sigmoidoscopy revealing external hemorrhoids.  Patient states she is doing well.  Since her flexible sigmoidoscopy of March 20, 2017 she only had one episode of hematochezia when she passed small amount of bright red blood per rectum with a bowel movement.  She says she is using MiraLAX regularly and has not been constipated.  She denies abdominal pain nausea or vomiting.  She states she has very good appetite.  She has chronic back pain.  She takes hydrocodone very occasionally. She also denies difficulty sleeping or confusion.  She says her heartburn is well controlled with twice daily pantoprazole.  She tells me that previously when she dropped the dose to once a day she started to have breakthrough symptoms.  She denies dysphagia.   Current Medications: Outpatient Encounter Medications as of 04/25/2017  Medication Sig  . albuterol (PROVENTIL HFA;VENTOLIN HFA) 108 (90 BASE) MCG/ACT inhaler Inhale 2 puffs into the lungs every 6 (six) hours as needed for wheezing or shortness of breath.   [name] aspirin (ASPIRIN EC) 81 MG EC tablet Take 81 mg by mouth daily.  Swallow whole.  [name] atorvastatin (LIPITOR) 20 MG tablet Take 20 mg by mouth every evening.   . cetirizine (ZYRTEC) 10 MG tablet Take 10 mg by mouth daily as needed for allergies (during allergy season).   . ezetimibe (ZETIA) 10 MG tablet Take 10 mg by mouth at bedtime.   [name] FLUoxetine (PROZAC) 40 MG capsule Take 40 mg by mouth daily.   . fluticasone (FLONASE) 50 MCG/ACT nasal spray Place 1 spray into both nostrils daily as needed for allergies.   . furosemide (LASIX) 40 MG tablet Take 1 tablet (40 mg total) by mouth as directed. 80 mg AM/40 PM x 3 days; then decrease to 40 mg once a day  . HYDROcodone-acetaminophen (NORCO) 10-325 MG tablet Take 1 tablet by mouth every 6 (six) hours as needed (for pain.).   [name] hydrocortisone (ANUSOL-HC) 2.5 % rectal cream Place 1 application rectally 2 (two) times daily as needed for hemorrhoids or anal itching.  . isosorbide mononitrate (IMDUR) 60 MG 24 hr tablet TAKE ONE TABLET BY MOUTH ONCE DAILY  . metoprolol succinate (TOPROL-XL) 50 MG 24 hr tablet Take 1 tablet (50 mg total) by mouth daily. Take with or immediately following a meal.  . NITROSTAT 0.4 MG SL tablet Place 0.4 mg under the tongue every 5 (five) minutes as needed for chest pain (x 3 tabs daily).   . pantoprazole (PROTONIX) 40 MG tablet Take 1 tablet (40 mg total) by mouth 2 (two) times daily.  . polyethylene glycol (MIRALAX / GLYCOLAX) packet Take 17 g by mouth daily.  [name] spironolactone (ALDACTONE) 25 MG tablet Take 12.5  mg by mouth daily. 1/2 tablet (12.63m ) by mouth daily  . valsartan (DIOVAN) 160 MG tablet Take 1 tablet (160 mg total) by mouth daily.   No facility-administered encounter medications on file as of 04/25/2017.      Objective: Blood pressure 124/80, weight 211 lb 3.2 oz (95.8 kg), last menstrual period 03/29/2011. Patient is alert and in no acute distress. She does not have asterixis. Conjunctiva is pink. Sclera is nonicteric Oropharyngeal mucosa is normal. No neck masses or  thyromegaly noted. Cardiac exam with regular rhythm normal S1 and S2. No murmur or gallop noted. Lungs are clear to auscultation. Abdomen is full.  On palpation it is soft and nontender.  Liver edge is indistinct but spleen is palpable.  Shifting dullness absent. No LE edema or clubbing noted.  Labs/studies Results:  CT images from 1January 12, 2019reviewed with patient.   Assessment:  #1.  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   Rectal bleeding episode 2 months ago it appeared to be due to hemorrhoids and not due to gastric varices.  If she bleeds from gastric varices she would be a candidate for TIPS or preferably BRTO procedure.  #2.  Chronic GERD.  She is doing well with double dose PPI.  Will try her on lesser dose.  #3.  Anemia possibly due to chronic disease.  Will check H&H.    Plan:  Patient will try pantoprazole 40 mg alternating with 80 mg(divided dose) every other day.  If this regimen does not work she can go back to twice daily schedule. Patient will go to the lab for CBC and AFP. Patient advised to report to emergency room immediately if she has rectal bleeding or melena.   Office visit in 6 months.

## 2017-04-25 NOTE — Patient Instructions (Signed)
Can try pantoprazole twice daily alternating with once daily. Physician will call with results of blood work when completed.

## 2017-04-26 DIAGNOSIS — G5601 Carpal tunnel syndrome, right upper limb: Secondary | ICD-10-CM | POA: Diagnosis not present

## 2017-04-26 DIAGNOSIS — S6981XA Other specified injuries of right wrist, hand and finger(s), initial encounter: Secondary | ICD-10-CM | POA: Diagnosis not present

## 2017-04-26 DIAGNOSIS — S52571D Other intraarticular fracture of lower end of right radius, subsequent encounter for closed fracture with routine healing: Secondary | ICD-10-CM | POA: Diagnosis not present

## 2017-04-26 LAB — AFP TUMOR MARKER: AFP-Tumor Marker: 3.3 ng/mL

## 2017-04-26 LAB — CBC
HCT: 33.2 % — ABNORMAL LOW (ref 35.0–45.0)
Hemoglobin: 10.3 g/dL — ABNORMAL LOW (ref 11.7–15.5)
MCH: 24 pg — ABNORMAL LOW (ref 27.0–33.0)
MCHC: 31 g/dL — ABNORMAL LOW (ref 32.0–36.0)
MCV: 77.2 fL — ABNORMAL LOW (ref 80.0–100.0)
MPV: 11.6 fL (ref 7.5–12.5)
Platelets: 96 10*3/uL — ABNORMAL LOW (ref 140–400)
RBC: 4.3 10*6/uL (ref 3.80–5.10)
RDW: 15.2 % — ABNORMAL HIGH (ref 11.0–15.0)
WBC: 4.3 10*3/uL (ref 3.8–10.8)

## 2017-04-28 ENCOUNTER — Encounter: Payer: Self-pay | Admitting: Cardiovascular Disease

## 2017-04-28 ENCOUNTER — Ambulatory Visit (INDEPENDENT_AMBULATORY_CARE_PROVIDER_SITE_OTHER): Payer: Medicare Other | Admitting: Cardiovascular Disease

## 2017-04-28 VITALS — BP 124/50 | HR 80 | Ht 62.0 in | Wt 208.8 lb

## 2017-04-28 DIAGNOSIS — I481 Persistent atrial fibrillation: Secondary | ICD-10-CM | POA: Diagnosis not present

## 2017-04-28 DIAGNOSIS — I5022 Chronic systolic (congestive) heart failure: Secondary | ICD-10-CM

## 2017-04-28 DIAGNOSIS — I2510 Atherosclerotic heart disease of native coronary artery without angina pectoris: Secondary | ICD-10-CM

## 2017-04-28 DIAGNOSIS — I4819 Other persistent atrial fibrillation: Secondary | ICD-10-CM

## 2017-04-28 NOTE — Progress Notes (Signed)
Cardiology Office Note Date:  04/28/2017   ID:  Samantha, [name] [birth year], MRN [id number]  PCP:  [name], MD  Cardiologist:  Sherren Mocha, MD    Chief Complaint  Patient presents with  . Fatigue     History of Present Illness: Samantha [name] is a 67 y.o. female who presents for follow-up of coronary artery disease, chronic systolic heart failure, and persistent atrial fibrillation.  She is not a candidate for anticoagulation or clopidogrel because of gastric varices.  She is here alone today. Has episodic shortness of breath. A few nights ago she had shortness of breath and orthopnea but this hasn't recurred.  She denies any recent problems with chest pain, leg swelling, or PND.  She did have some bleeding and underwent colonoscopy in December 2018.  She is recently found to be anemic and iron therapy is started.  Past Medical History:  Diagnosis Date  . Chronic systolic CHF (congestive heart failure) ([place])   . Cirrhosis of liver ([place])   . Coronary artery disease    a. s/p CABG 2001 w/ (LIMA-OM, SVG-D1, SVG-RCA). b. h/o multiple PCIs per Dr. Antionette Char note.  . Depression   . Esophageal varices ([place])    New 2013  . Gastroesophageal reflux disease   . History of pneumonia   . Hyperlipidemia   . Hypertension   . Obesity   . Osteoarthritis   . Paroxysmal atrial flutter ([place])    a. dx 05/2016.  [name] Persistent atrial fibrillation ([place])    a. reported in hosp 07/2016, not on anticoag due to cirrhosis and liver disease, low platelets, varices.  . Thrombocytopenia ([place])     Past Surgical History:  Procedure Laterality Date  . ABDOMINAL HYSTERECTOMY    . BACK SURGERY    . CARDIAC CATHETERIZATION  2004   left internal mammary artery to the obtuse marginal  was found to be small and thread like.  The two grafts were patent.  The left circumflex had 90% in-stent restenosis and cutting balloon angioplasty was performed followed by placement of a 3.0 x 12m Taxus drug -eluting  stent.    .Marland KitchenCARDIAC CATHETERIZATION  2006   There was in-stent restenosis in the left circumflex and this was treated with cutting balloon angioplasty   . CARDIAC CATHETERIZATION  2008   vein graft to the to the obtuse marginal was patent, although small, left circumflex had 40% in-stent restenosis, ejection fraction 40-45%.  The patient was medically mananged.  .Marland KitchenCARDIAC CATHETERIZATION N/A 01/09/2015   Procedure: Left Heart Cath and Cors/Grafts Angiography;  Surgeon: HBelva Crome MD;  [facility];  Service: Cardiovascular;  Laterality: N/A;  . COLONOSCOPY  03/29/2011   Procedure: COLONOSCOPY;  Surgeon: MJamesetta So MD;  [facility];  Service: Gastroenterology;  Laterality: N/A;  . CORONARY ARTERY BYPASS GRAFT  May 31,2001   x 3 with a vein graft to the first diagonal, vein graft to the right coronary  artery, and a free left internal mammary  artery to the obtuse marginal   . ESOPHAGEAL BANDING N/A 07/04/2012   Procedure: ESOPHAGEAL BANDING;  Surgeon: NRogene Houston MD;  [facility];  Service: Endoscopy;  Laterality: N/A;  . ESOPHAGEAL BANDING N/A 09/17/2012   Procedure: ESOPHAGEAL BANDING;  Surgeon: NRogene Houston MD;  [facility];  Service: Endoscopy;  Laterality: N/A;  . ESOPHAGEAL BANDING N/A 10/22/2013   Procedure: ESOPHAGEAL BANDING;  Surgeon: NMechele Dawley  Laural Golden, MD;  [facility];  Service: Endoscopy;  Laterality: N/A;  . ESOPHAGEAL BANDING N/A 11/28/2014   Procedure: ESOPHAGEAL BANDING;  Surgeon: Rogene Houston, MD;  [facility];  Service: Endoscopy;  Laterality: N/A;  . ESOPHAGEAL BANDING N/A 10/29/2015   Procedure: ESOPHAGEAL BANDING;  Surgeon: Rogene Houston, MD;  [facility];  Service: Endoscopy;  Laterality: N/A;  . ESOPHAGOGASTRODUODENOSCOPY  12/20/2011   Procedure: ESOPHAGOGASTRODUODENOSCOPY (EGD);  Surgeon: Jamesetta So, MD;  [facility];  Service: Gastroenterology;   Laterality: N/A;  . ESOPHAGOGASTRODUODENOSCOPY N/A 07/04/2012   Procedure: ESOPHAGOGASTRODUODENOSCOPY (EGD);  Surgeon: Rogene Houston, MD;  [facility];  Service: Endoscopy;  Laterality: N/A;  235-moved to 255 Ann to notify pt  . ESOPHAGOGASTRODUODENOSCOPY N/A 09/17/2012   Procedure: ESOPHAGOGASTRODUODENOSCOPY (EGD);  Surgeon: Rogene Houston, MD;  [facility];  Service: Endoscopy;  Laterality: N/A;  730  . ESOPHAGOGASTRODUODENOSCOPY N/A 10/22/2013   Procedure: ESOPHAGOGASTRODUODENOSCOPY (EGD);  Surgeon: Rogene Houston, MD;  [facility];  Service: Endoscopy;  Laterality: N/A;  730  . ESOPHAGOGASTRODUODENOSCOPY N/A 11/28/2014   Procedure: ESOPHAGOGASTRODUODENOSCOPY (EGD);  Surgeon: Rogene Houston, MD;  [facility];  Service: Endoscopy;  Laterality: N/A;  1:25  . ESOPHAGOGASTRODUODENOSCOPY N/A 10/29/2015   Procedure: ESOPHAGOGASTRODUODENOSCOPY (EGD);  Surgeon: Rogene Houston, MD;  [facility];  Service: Endoscopy;  Laterality: N/A;  12:00  . ESOPHAGOGASTRODUODENOSCOPY N/A 12/12/2016   Procedure: ESOPHAGOGASTRODUODENOSCOPY (EGD);  Surgeon: Rogene Houston, MD;  [facility];  Service: Endoscopy;  Laterality: N/A;  225  . FLEXIBLE SIGMOIDOSCOPY N/A 03/20/2017   Procedure: FLEXIBLE SIGMOIDOSCOPY;  Surgeon: Rogene Houston, MD;  [facility];  Service: Endoscopy;  Laterality: N/A;  7:30  . JOINT REPLACEMENT    . LEFT HEART CATH AND CORS/GRAFTS ANGIOGRAPHY N/A 08/15/2016   Procedure: Left Heart Cath and Cors/Grafts Angiography;  Surgeon: Jettie Booze, MD;  Location: [facility];  Service: Cardiovascular;  Laterality: N/A;  . LEFT HEART CATHETERIZATION WITH CORONARY/GRAFT ANGIOGRAM N/A 12/25/2013   Procedure: LEFT HEART CATHETERIZATION WITH Beatrix Fetters;  Surgeon: Blane Ohara, MD;  Location: [facility] C[facility] LAB;  Service: Cardiovascular;  Laterality: N/A;  . rotator cuff left  2007  . TONSILLECTOMY        Current Outpatient Medications  Medication Sig Dispense Refill  . albuterol (PROVENTIL HFA;VENTOLIN HFA) 108 (90 BASE) MCG/ACT inhaler Inhale 2 puffs into the lungs every 6 (six) hours as needed for wheezing or shortness of breath.     [name] atorvastatin (LIPITOR) 20 MG tablet Take 20 mg by mouth every evening.     . cetirizine (ZYRTEC) 10 MG tablet Take 10 mg by mouth daily as needed for allergies (during allergy season).     . ezetimibe (ZETIA) 10 MG tablet Take 10 mg by mouth at bedtime.     [name] FLUoxetine (PROZAC) 40 MG capsule Take 40 mg by mouth daily.     . fluticasone (FLONASE) 50 MCG/ACT nasal spray Place 1 spray into both nostrils daily as needed for allergies.     . furosemide (LASIX) 40 MG tablet Take 80 mg by mouth daily.     [name] HYDROcodone-acetaminophen (NORCO) 10-325 MG tablet Take 1 tablet by mouth every 6 (six) hours as needed (for pain.).     [name] hydrocortisone (ANUSOL-HC) 2.5 % rectal cream Place 1 application rectally 2 (two) times daily as needed for hemorrhoids or anal itching.    [name]  isosorbide mononitrate (IMDUR) 60 MG 24 hr tablet TAKE ONE TABLET BY MOUTH ONCE DAILY 30 tablet 7  . metoprolol succinate (TOPROL-XL) 50 MG 24 hr tablet Take 1 tablet (50 mg total) by mouth daily. Take with or immediately following a meal. 30 tablet 11  . NITROSTAT 0.4 MG SL tablet Place 0.4 mg under the tongue every 5 (five) minutes as needed for chest pain (x 3 tabs daily).     . pantoprazole (PROTONIX) 40 MG tablet Take 1 tablet (40 mg total) by mouth 2 (two) times daily. 180 tablet 2  . polyethylene glycol (MIRALAX / GLYCOLAX) packet Take 17 g by mouth daily.    [name] spironolactone (ALDACTONE) 25 MG tablet Take 12.5 mg by mouth daily. 1/2 tablet (12.39m ) by mouth daily    . valsartan (DIOVAN) 160 MG tablet Take 1 tablet (160 mg total) by mouth daily. 30 tablet 11   No current facility-administered medications for this visit.     Allergies:   Acetaminophen; Oxycodone; Ace inhibitors; Cefaclor;  Cephalexin; Penicillins; Pregabalin; and Tape   Social History:  The patient  reports that she quit smoking about 21 years ago. Her smoking use included cigarettes. She has a 10.00 pack-year smoking history. she has never used smokeless tobacco. She reports that she does not drink alcohol or use drugs.   Family History:  The patient's family history includes Coronary artery disease in her sister; Heart attack in her mother; Heart attack (age of onset: 72 in her father.    ROS:  Please see the history of present illness.  Otherwise, review of systems is positive for fatigue, weakness.  All other systems are reviewed and negative.    PHYSICAL EXAM: VS:  BP (!) 124/50   Pulse 80   Ht 5' 2"  (1.575 m)   Wt 208 lb 12.8 oz (94.7 kg)   LMP 03/29/2011   BMI 38.19 kg/m  , BMI Body mass index is 38.19 kg/m. GEN: Well nourished, well developed, in no acute distress  HEENT: normal  Neck: no JVD, no masses. No carotid bruits Cardiac: RRR without murmur or gallop                Respiratory:  clear to auscultation bilaterally, normal work of breathing GI: soft, nontender, nondistended, + BS MS: no deformity or atrophy  Ext: no pretibial edema, pedal pulses 2+= bilaterally Skin: warm and dry, no rash Neuro:  Strength and sensation are intact Psych: euthymic mood, full affect  EKG:  EKG is not ordered today.  Recent Labs: 08/14/2016: B Natriuretic Peptide 104.0 02/14/2017: ALT 15 03/03/2017: BUN 10; Creatinine, Ser 0.93; Potassium 3.9; Sodium 145 04/25/2017: Hemoglobin 10.3; Platelets 96   Lipid Panel     Component Value Date/Time   CHOL 143 08/15/2016 0309   TRIG 50 08/15/2016 0309   HDL 65 08/15/2016 0309   CHOLHDL 2.2 08/15/2016 0309   VLDL 10 08/15/2016 0309   LDLCALC 68 08/15/2016 0309      Wt Readings from Last 3 Encounters:  04/28/17 208 lb 12.8 oz (94.7 kg)  04/25/17 211 lb 3.2 oz (95.8 kg)  03/22/17 208 lb (94.3 kg)     Cardiac Studies Reviewed: Echo 01-25-2017: Study  Conclusions  - Left ventricle: Wall thickness was increased in a pattern of mild   LVH. Inteterminant diastolic function (atrial fibrillation).   Systolic function was moderately to severely reduced. The   estimated ejection fraction was in the range of 30% to 35%.   Septal-lateral dyssynchrony.  Severe hypokinesis of the inferior,   inferolateral, and anterolateral walls. - Aortic valve: There was no stenosis. - Mitral valve: Mildly calcified annulus. There was trivial   regurgitation. - Left atrium: The atrium was mildly dilated. - Right ventricle: The cavity size was mildly dilated. Systolic   function was moderately reduced. - Right atrium: The atrium was mildly dilated. - Tricuspid valve: Peak RV-RA gradient (S): 24 mm Hg. - Pulmonary arteries: PA peak pressure: 27 mm Hg (S). - Inferior vena cava: The vessel was normal in size. The   respirophasic diameter changes were in the normal range (= 50%),   consistent with normal central venous pressure.  Impressions:  - The patient appeared to be in atrial fibrillation. Normal LV size   with mild LV hypertrophy. EF 30-35% with wall motion   abnormalities as noted above. Mildly dilated RV with moderately   decreased systolic function. No significant valvular   abnormalities.   ASSESSMENT AND PLAN: 1.  Chronic combined systolic and diastolic heart failure: The patient appears to be well compensated at present.  She is taking isosorbide, metoprolol succinate, Spironolactone, valsartan, and furosemide.  She had a reaction to [facility] with swelling of her eyes.  Her most recent echo is reviewed.  Will repeat in 6 months.  2. CAD, native vessel, without angina: In considering her overall risk of antiplatelet therapy with nonalcoholic cirrhosis and known gastric varices, I recommended that she stop aspirin.  She understands she can take this if chest pain arises.  Her coronary disease is been stable over time.  3. Persistent atrial  fibrillation: Not a candidate for anticoagulation.  Continue rate control with metoprolol succinate.  We will set up a follow-up echocardiogram and office visit in 6 months.  Current medicines are reviewed with the patient today.  The patient does not have concerns regarding medicines.  Labs/ tests ordered today include:   Orders Placed This Encounter  Procedures  . ECHOCARDIOGRAM COMPLETE    Disposition:   FU 6 months  Signed, Sherren Mocha, MD  04/28/2017 5:27 PM    [facility] [address], [place], [place]  [address] Phone: [phone]; Fax: [phone]

## 2017-04-28 NOTE — Patient Instructions (Signed)
Medication Instructions:  1) STOP ASPIRIN  Labwork: None  Testing/Procedures: Your provider has requested that you have an echocardiogram in 6 months. Echocardiography is a painless test that uses sound waves to create images of your heart. It provides your doctor with information about the size and shape of your heart and how well your heart's chambers and valves are working. This procedure takes approximately one hour. There are no restrictions for this procedure.  Follow-Up: Your provider wants you to follow-up in: 6 months with Dr. Burt Knack. You will receive a reminder letter in the mail two months in advance. If you don't receive a letter, please call our office to schedule the follow-up appointment.    Any Other Special Instructions Will Be Listed Below (If Applicable).     If you need a refill on your cardiac medications before your next appointment, please call your pharmacy.

## 2017-05-08 ENCOUNTER — Ambulatory Visit: Payer: Medicare Other | Admitting: Cardiovascular Disease

## 2017-05-10 DIAGNOSIS — Z6838 Body mass index (BMI) 38.0-38.9, adult: Secondary | ICD-10-CM | POA: Diagnosis not present

## 2017-05-10 DIAGNOSIS — G4709 Other insomnia: Secondary | ICD-10-CM | POA: Diagnosis not present

## 2017-05-22 ENCOUNTER — Telehealth: Payer: Self-pay | Admitting: Cardiovascular Disease

## 2017-05-22 ENCOUNTER — Other Ambulatory Visit: Payer: Self-pay | Admitting: Cardiovascular Disease

## 2017-05-22 NOTE — Telephone Encounter (Signed)
Left message to call back  

## 2017-05-22 NOTE — Telephone Encounter (Signed)
New Message    Pt c/o swelling: STAT is pt has developed SOB within 24 hours  1) How much weight have you gained and in what time span? 8lb in a week   2) If swelling, where is the swelling located? face  3) Are you currently taking a fluid pill? Yes, lasix  4) Are you currently SOB? Yes, for about 4-5 days  5) Do you have a log of your daily weights (if so, list)? Not on hand   6) Have you gained 3 pounds in a day or 5 pounds in a week? 8lb in a week  7) Have you traveled recently? no

## 2017-05-23 NOTE — Progress Notes (Signed)
Cardiology Office Note    Date:  05/24/2017   ID:  Samantha [name], DOB [birth year], MRN [id number]  PCP:  [name], MD  Cardiologist: Dr. Burt Knack  Chief Complaint: weight gain and SOB  History of Present Illness:   Samantha [name] is a 67 y.o. female CAD status post CABG in 2011 and multiple PCI afterwards, chronic systolic heart failure, persistent atrial fibrillation (she is not on anticoagulation or Plavix due to gastric varices) hypertension and hyperlipidemia added to my schedule for evaluation of weight gain and shortness of breath.  Last cardiac cath 07/2016 showed no significant change in coronary anatomic.  May need  EP evaluation for possible ICD placement given decline in EF.  Echocardiogram November 2018 showed mild left ventricular hypertrophy, LVEF of 30-35% with wall motion abnormality.  No significant valvular abnormality noted.  She did not tolerated Entresto as she had swelling of her eyes.  She was doing relatively well when last seen by Dr. Burt Knack April 28, 2017.  He recommended echo in 6 months.  Here today for evaluation of weight gain and shortness of breath. She has dyspnea with minimal ambulation. Admits to having orthopnea, PND and LE edema. She has gained 4lb overnight despite taking extra lasix yesterday.  She has gained 13 pounds based on our scale since last office visit 3 weeks ago.  She is compliant with low-sodium diet and fluid restriction.  Compliant with medication.  She is severely dyspneic that she cannot walk few steps.  She felt chest heaviness yesterday when trying to bend over.  She also has cough but no congestion or fever.  She denies syncope or melena.  Past Medical History:  Diagnosis Date  . Chronic systolic CHF (congestive heart failure) ([place])   . Cirrhosis of liver ([place])   . Coronary artery disease    a. s/p CABG 2001 w/ (LIMA-OM, SVG-D1, SVG-RCA). b. h/o multiple PCIs per Dr. Antionette Char note.  . Depression   . Esophageal varices ([place])      New 2013  . Gastroesophageal reflux disease   . History of pneumonia   . Hyperlipidemia   . Hypertension   . Obesity   . Osteoarthritis   . Paroxysmal atrial flutter ([place])    a. dx 05/2016.  [name] Persistent atrial fibrillation ([place])    a. reported in hosp 07/2016, not on anticoag due to cirrhosis and liver disease, low platelets, varices.  . Thrombocytopenia ([place])     Past Surgical History:  Procedure Laterality Date  . ABDOMINAL HYSTERECTOMY    . BACK SURGERY    . CARDIAC CATHETERIZATION  2004   left internal mammary artery to the obtuse marginal  was found to be small and thread like.  The two grafts were patent.  The left circumflex had 90% in-stent restenosis and cutting balloon angioplasty was performed followed by placement of a 3.0 x 12mm Taxus drug -eluting stent.    [name] CARDIAC CATHETERIZATION  2006   There was in-stent restenosis in the left circumflex and this was treated with cutting balloon angioplasty   . CARDIAC CATHETERIZATION  2008   vein graft to the to the obtuse marginal was patent, although small, left circumflex had 40% in-stent restenosis, ejection fraction 40-45%.  The patient was medically mananged.  [name] CARDIAC CATHETERIZATION N/A 01/09/2015   Procedure: Left Heart Cath and Cors/Grafts Angiography;  Surgeon: Belva Crome, MD;  Location: [facility];  Service: Cardiovascular;  Laterality: N/A;  . COLONOSCOPY  03/29/2011  Procedure: COLONOSCOPY;  Surgeon: Jamesetta So, MD;  [facility];  Service: Gastroenterology;  Laterality: N/A;  . CORONARY ARTERY BYPASS GRAFT  May 31,2001   x 3 with a vein graft to the first diagonal, vein graft to the right coronary  artery, and a free left internal mammary  artery to the obtuse marginal   . ESOPHAGEAL BANDING N/A 07/04/2012   Procedure: ESOPHAGEAL BANDING;  Surgeon: Rogene Houston, MD;  [facility];  Service: Endoscopy;  Laterality: N/A;  . ESOPHAGEAL BANDING N/A 09/17/2012   Procedure:  ESOPHAGEAL BANDING;  Surgeon: Rogene Houston, MD;  [facility];  Service: Endoscopy;  Laterality: N/A;  . ESOPHAGEAL BANDING N/A 10/22/2013   Procedure: ESOPHAGEAL BANDING;  Surgeon: Rogene Houston, MD;  [facility];  Service: Endoscopy;  Laterality: N/A;  . ESOPHAGEAL BANDING N/A 11/28/2014   Procedure: ESOPHAGEAL BANDING;  Surgeon: Rogene Houston, MD;  [facility];  Service: Endoscopy;  Laterality: N/A;  . ESOPHAGEAL BANDING N/A 10/29/2015   Procedure: ESOPHAGEAL BANDING;  Surgeon: Rogene Houston, MD;  [facility];  Service: Endoscopy;  Laterality: N/A;  . ESOPHAGOGASTRODUODENOSCOPY  12/20/2011   Procedure: ESOPHAGOGASTRODUODENOSCOPY (EGD);  Surgeon: Jamesetta So, MD;  [facility];  Service: Gastroenterology;  Laterality: N/A;  . ESOPHAGOGASTRODUODENOSCOPY N/A 07/04/2012   Procedure: ESOPHAGOGASTRODUODENOSCOPY (EGD);  Surgeon: Rogene Houston, MD;  [facility];  Service: Endoscopy;  Laterality: N/A;  235-moved to 255 Ann to notify pt  . ESOPHAGOGASTRODUODENOSCOPY N/A 09/17/2012   Procedure: ESOPHAGOGASTRODUODENOSCOPY (EGD);  Surgeon: Rogene Houston, MD;  [facility];  Service: Endoscopy;  Laterality: N/A;  730  . ESOPHAGOGASTRODUODENOSCOPY N/A 10/22/2013   Procedure: ESOPHAGOGASTRODUODENOSCOPY (EGD);  Surgeon: Rogene Houston, MD;  [facility];  Service: Endoscopy;  Laterality: N/A;  730  . ESOPHAGOGASTRODUODENOSCOPY N/A 11/28/2014   Procedure: ESOPHAGOGASTRODUODENOSCOPY (EGD);  Surgeon: Rogene Houston, MD;  [facility];  Service: Endoscopy;  Laterality: N/A;  1:25  . ESOPHAGOGASTRODUODENOSCOPY N/A 10/29/2015   Procedure: ESOPHAGOGASTRODUODENOSCOPY (EGD);  Surgeon: Rogene Houston, MD;  [facility];  Service: Endoscopy;  Laterality: N/A;  12:00  . ESOPHAGOGASTRODUODENOSCOPY N/A 12/12/2016   Procedure: ESOPHAGOGASTRODUODENOSCOPY (EGD);  Surgeon: Rogene Houston, MD;  [facility];  Service: Endoscopy;  Laterality: N/A;  225  . FLEXIBLE SIGMOIDOSCOPY N/A 03/20/2017   Procedure: FLEXIBLE SIGMOIDOSCOPY;  Surgeon: Rogene Houston, MD;  [facility];  Service: Endoscopy;  Laterality: N/A;  7:30  . JOINT REPLACEMENT    . LEFT HEART CATH AND CORS/GRAFTS ANGIOGRAPHY N/A 08/15/2016   Procedure: Left Heart Cath and Cors/Grafts Angiography;  Surgeon: Jettie Booze, MD;  Location: [place] [facility];  Service: Cardiovascular;  Laterality: N/A;  . LEFT HEART CATHETERIZATION WITH CORONARY/GRAFT ANGIOGRAM N/A 12/25/2013   Procedure: LEFT HEART CATHETERIZATION WITH Beatrix Fetters;  Surgeon: Blane Ohara, MD;  Location: [facility];  Service: Cardiovascular;  Laterality: N/A;  . rotator cuff left  2007  . TONSILLECTOMY      Current Medications: Prior to Admission medications   Medication Sig Start Date End Date Taking? Authorizing Provider  albuterol (PROVENTIL HFA;VENTOLIN HFA) 108 (90 BASE) MCG/ACT inhaler Inhale 2 puffs into the lungs every 6 (six) hours as needed for wheezing or shortness of breath.     [provider]  atorvastatin (LIPITOR) 20 MG tablet Take 20 mg by mouth every evening.  [provider]  cetirizine (ZYRTEC) 10 MG tablet Take 10 mg by mouth daily as needed for allergies (during allergy season).     [provider]  ezetimibe (ZETIA) 10 MG tablet Take 10 mg by mouth at bedtime.     [provider]  FLUoxetine (PROZAC) 40 MG capsule Take 40 mg by mouth daily.     [provider]  fluticasone (FLONASE) 50 MCG/ACT nasal spray Place 1 spray into both nostrils daily as needed for allergies.  07/21/16   [provider]  furosemide (LASIX) 40 MG tablet Take 80 mg by mouth daily.     [provider]  HYDROcodone-acetaminophen (NORCO) 10-325 MG tablet Take 1 tablet by mouth every 6 (six) hours as needed (for pain.).     [provider]  hydrocortisone  (ANUSOL-HC) 2.5 % rectal cream Place 1 application rectally 2 (two) times daily as needed for hemorrhoids or anal itching.    [provider]  isosorbide mononitrate (IMDUR) 60 MG 24 hr tablet TAKE ONE TABLET BY MOUTH ONCE DAILY 10/06/16   Sherren Mocha, MD  metoprolol succinate (TOPROL-XL) 50 MG 24 hr tablet Take 1 tablet (50 mg total) by mouth daily. Take with or immediately following a meal. 01/25/17 01/20/18  Sherren Mocha, MD  NITROSTAT 0.4 MG SL tablet Place 0.4 mg under the tongue every 5 (five) minutes as needed for chest pain (x 3 tabs daily).  11/15/13   [provider]  pantoprazole (PROTONIX) 40 MG tablet Take 1 tablet (40 mg total) by mouth 2 (two) times daily. 02/26/15   Sherren Mocha, MD  polyethylene glycol [facility] / Floria Raveling) packet Take 17 g by mouth daily.    [provider]  spironolactone (ALDACTONE) 25 MG tablet Take 12.5 mg by mouth daily. 1/2 tablet (12.5mg  ) by mouth daily 02/14/17   Dunn, Nedra Hai, PA-C  valsartan (DIOVAN) 160 MG tablet Take 1 tablet (160 mg total) by mouth daily. 03/31/17 03/26/18  Sherren Mocha, MD    Allergies:   Acetaminophen; Oxycodone; Ace inhibitors; Cefaclor; Cephalexin; Penicillins; Pregabalin; and Tape   Social History   Socioeconomic History  . Marital status: Divorced    Spouse name: Not on file  . Number of children: Not on file  . Years of education: Not on file  . Highest education level: Not on file  Occupational History  . Occupation: Disabled  Social Needs  . Financial resource strain: Not on file  . Food insecurity:    Worry: Not on file    Inability: Not on file  . Transportation needs:    Medical: Not on file    Non-medical: Not on file  Tobacco Use  . Smoking status: Former Smoker    Packs/day: 0.50    Years: 20.00    Pack years: 10.00    Types: Cigarettes    Last attempt to quit: 07/21/1995    Years since quitting: 21.8  . Smokeless tobacco: Never Used  Substance and Sexual Activity    . Alcohol use: No  . Drug use: No  . Sexual activity: Not on file  Lifestyle  . Physical activity:    Days per week: Not on file    Minutes per session: Not on file  . Stress: Not on file  Relationships  . Social connections:    Talks on phone: Not on file    Gets together: Not on file    Attends religious service: Not on file    Active member of  club or organization: Not on file    Attends meetings of clubs or organizations: Not on file    Relationship status: Not on file  Other Topics Concern  . Not on file  Social History Narrative   Lives in [place] by herself.       Family History:  The patient's family history includes Coronary artery disease in her sister; Heart attack in her mother; Heart attack (age of onset: 22) in her father.   ROS:   Please see the history of present illness.    ROS All other systems reviewed and are negative.   PHYSICAL EXAM:   VS:  BP (!) 112/54   Pulse 69   Ht 5\' 2"  (1.575 m)   Wt 221 lb 12.8 oz (100.6 kg)   LMP 03/29/2011   SpO2 96%   BMI 40.57 kg/m    GEN: Well nourished, well developed, in no acute distress  HEENT: normal  Neck: , carotid bruits, or masses,  VD elevated up to her neck Cardiac: RRR; no murmurs, rubs, or gallops, bilateral lower extremity edema 3+ extending up to her belly Respiratory:  clear to auscultation bilaterally, normal work of breathing GI: soft, nontender, distended, + BS MS: no deformity or atrophy  Skin: warm and dry, no rash Neuro:  Alert and Oriented x 3, Strength and sensation are intact Psych: euthymic mood, full affect  Wt Readings from Last 3 Encounters:  05/24/17 221 lb 12.8 oz (100.6 kg)  04/28/17 208 lb 12.8 oz (94.7 kg)  04/25/17 211 lb 3.2 oz (95.8 kg)      Studies/Labs Reviewed:   EKG:  EKG is not ordered today.    Recent Labs: 08/14/2016: B Natriuretic Peptide 104.0 02/14/2017: ALT 15 03/03/2017: BUN 10; Creatinine, Ser 0.93; Potassium 3.9; Sodium 145 04/25/2017: Hemoglobin 10.3;  Platelets 96   Lipid Panel    Component Value Date/Time   CHOL 143 08/15/2016 0309   TRIG 50 08/15/2016 0309   HDL 65 08/15/2016 0309   CHOLHDL 2.2 08/15/2016 0309   VLDL 10 08/15/2016 0309   LDLCALC 68 08/15/2016 0309    Additional studies/ records that were reviewed today include:   Echo 01-25-2017: Study Conclusions  - Left ventricle: Wall thickness was increased in a pattern of mild LVH. Inteterminant diastolic function (atrial fibrillation). Systolic function was moderately to severely reduced. The estimated ejection fraction was in the range of 30% to 35%. Septal-lateral dyssynchrony. Severe hypokinesis of the inferior, inferolateral, and anterolateral walls. - Aortic valve: There was no stenosis. - Mitral valve: Mildly calcified annulus. There was trivial regurgitation. - Left atrium: The atrium was mildly dilated. - Right ventricle: The cavity size was mildly dilated. Systolic function was moderately reduced. - Right atrium: The atrium was mildly dilated. - Tricuspid valve: Peak RV-RA gradient (S): 24 mm Hg. - Pulmonary arteries: PA peak pressure: 27 mm Hg (S). - Inferior vena cava: The vessel was normal in size. The respirophasic diameter changes were in the normal range (= 50%), consistent with normal central venous pressure.  Impressions:  - The patient appeared to be in atrial fibrillation. Normal LV size with mild LV hypertrophy. EF 30-35% with wall motion abnormalities as noted above. Mildly dilated RV with moderately decreased systolic function. No significant valvular abnormalities.  Left Heart Cath and Cors/Grafts Angiography  07/2016  Conclusion     Ost 1st Diag to 1st Diag lesion, 65 %stenosed. SVG to diagonal is patent.  LIMA attached to SVG to diagonal as a Y graft.  LIMA is atretic.  Prox RCA to Mid RCA lesion, 95 %stenosed. SVG to RCA is patent.  Ost Cx to Mid Cx lesion, 40 %stenosed.  The left ventricular  ejection fraction is 25-35% by visual estimate.  There is moderate to severe left ventricular systolic dysfunction.  LV end diastolic pressure is moderately elevated.  There is no aortic valve stenosis.   No significant change in coronary circulation.  EF has decreased from prior report, with more pronounced inferior hypokinesis.  May need EP f/u for possible AICD placement.  Watch overnight, and plan for discharge in AM if no other procedures required.     ASSESSMENT & PLAN:    1. 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  Overnight 4lb.  No improvement despite increase Lasix yesterday.  Decreased urine output.  Barely able to walk or breathe.  Patient was seen and examined by Dr. Burt Knack as well. significant volume overload on exam.  Initial plan was to directly admit and evaluation by CHF team however no bed available.  We have sent patient to ER and will be admitted by CHF team.    2.  CAD - No angina.   3.  Hypertension - Stable on current medications.   4.  Hyperlipidemia - 08/15/2016: Cholesterol 143; HDL 65; LDL Cholesterol 68; Triglycerides 50; VLDL 10  - Continue statin.   5. Persistent atrial fibrilaltion - she is not on anticoagulation or Plavix due to gastric varices. Rate controlled.    Medication Adjustments/Labs and Tests Ordered: Current medicines are reviewed at length with the patient today.  Concerns regarding medicines are outlined above.  Medication changes, Labs and Tests ordered today are listed in the Patient Instructions below. Patient Instructions  Your physician recommends that you continue on your current medications as directed. Please refer to the Current Medication list given to you today.  Your physician recommends that you schedule a follow-up appointment in:  PT IS BEING ADMITTED TO TELEMETRY FOR CHF     Jarrett Soho, [place]  05/24/2017 9:12 AM    [address], [place], [place]  [address] Phone: [phone]; Fax: [phone]

## 2017-05-23 NOTE — Telephone Encounter (Signed)
Patient states she gained about 8 pounds in 1 week, and an additional 5 pounds overnight last night. She does not have her weights to report. Her breathing is getting more labored when walking. She states it is difficult to walk from room to room without getting winded. She reports the swelling is most noticeable in her face. Her eyes were almost swollen shut when she woke up yesterday. Today she also noticed some slight swelling in her feet. She denies swelling elsewhere at this time. She is limiting her salt. She elevates her legs when sitting. She is not audibly SOB on the phone. Confirmed with patient she is taking her medications as directed. Instructed patient to take an extra dose of lasix today and tomorrow and scheduled her tomorrow in the office for evaluation.  She will continue to limit salt and elevate legs. She will call if symptoms worsen prior to appointment or if extra Lasix dose does not seem to help. She was grateful for call and agrees with treatment plan.

## 2017-05-24 ENCOUNTER — Inpatient Hospital Stay: Admit: 2017-05-24 | Payer: Self-pay | Admitting: Cardiovascular Disease

## 2017-05-24 ENCOUNTER — Other Ambulatory Visit: Payer: Self-pay

## 2017-05-24 ENCOUNTER — Encounter (HOSPITAL_COMMUNITY): Payer: Self-pay

## 2017-05-24 ENCOUNTER — Emergency Department (HOSPITAL_COMMUNITY): Payer: Medicare Other

## 2017-05-24 ENCOUNTER — Ambulatory Visit (INDEPENDENT_AMBULATORY_CARE_PROVIDER_SITE_OTHER): Payer: Medicare Other | Admitting: Physician Assistant

## 2017-05-24 ENCOUNTER — Inpatient Hospital Stay (HOSPITAL_COMMUNITY)
Admission: EM | Admit: 2017-05-24 | Discharge: 2017-05-30 | DRG: 287 | Disposition: A | Discharge status: Home / Self Care | Payer: Medicare Other | Attending: Cardiovascular Disease | Admitting: Cardiovascular Disease

## 2017-05-24 ENCOUNTER — Encounter: Payer: Self-pay | Admitting: Physician Assistant

## 2017-05-24 VITALS — BP 112/54 | HR 69 | Ht 62.0 in | Wt 221.8 lb

## 2017-05-24 DIAGNOSIS — Z79899 Other long term (current) drug therapy: Secondary | ICD-10-CM

## 2017-05-24 DIAGNOSIS — K219 Gastro-esophageal reflux disease without esophagitis: Secondary | ICD-10-CM | POA: Diagnosis present

## 2017-05-24 DIAGNOSIS — I2510 Atherosclerotic heart disease of native coronary artery without angina pectoris: Secondary | ICD-10-CM

## 2017-05-24 DIAGNOSIS — I5033 Acute on chronic diastolic (congestive) heart failure: Secondary | ICD-10-CM | POA: Diagnosis not present

## 2017-05-24 DIAGNOSIS — R05 Cough: Secondary | ICD-10-CM | POA: Diagnosis not present

## 2017-05-24 DIAGNOSIS — E669 Obesity, unspecified: Secondary | ICD-10-CM | POA: Diagnosis present

## 2017-05-24 DIAGNOSIS — I5043 Acute on chronic combined systolic (congestive) and diastolic (congestive) heart failure: Secondary | ICD-10-CM | POA: Diagnosis not present

## 2017-05-24 DIAGNOSIS — E782 Mixed hyperlipidemia: Secondary | ICD-10-CM | POA: Diagnosis not present

## 2017-05-24 DIAGNOSIS — K644 Residual hemorrhoidal skin tags: Secondary | ICD-10-CM | POA: Diagnosis present

## 2017-05-24 DIAGNOSIS — I509 Heart failure, unspecified: Secondary | ICD-10-CM | POA: Diagnosis not present

## 2017-05-24 DIAGNOSIS — I8510 Secondary esophageal varices without bleeding: Secondary | ICD-10-CM | POA: Diagnosis present

## 2017-05-24 DIAGNOSIS — I864 Gastric varices: Secondary | ICD-10-CM | POA: Diagnosis present

## 2017-05-24 DIAGNOSIS — Z955 Presence of coronary angioplasty implant and graft: Secondary | ICD-10-CM

## 2017-05-24 DIAGNOSIS — I481 Persistent atrial fibrillation: Secondary | ICD-10-CM

## 2017-05-24 DIAGNOSIS — Z8249 Family history of ischemic heart disease and other diseases of the circulatory system: Secondary | ICD-10-CM

## 2017-05-24 DIAGNOSIS — I5023 Acute on chronic systolic (congestive) heart failure: Secondary | ICD-10-CM | POA: Diagnosis not present

## 2017-05-24 DIAGNOSIS — E119 Type 2 diabetes mellitus without complications: Secondary | ICD-10-CM | POA: Diagnosis present

## 2017-05-24 DIAGNOSIS — Z951 Presence of aortocoronary bypass graft: Secondary | ICD-10-CM | POA: Diagnosis not present

## 2017-05-24 DIAGNOSIS — Z90710 Acquired absence of both cervix and uterus: Secondary | ICD-10-CM

## 2017-05-24 DIAGNOSIS — K7581 Nonalcoholic steatohepatitis (NASH): Secondary | ICD-10-CM | POA: Diagnosis present

## 2017-05-24 DIAGNOSIS — D696 Thrombocytopenia, unspecified: Secondary | ICD-10-CM | POA: Diagnosis present

## 2017-05-24 DIAGNOSIS — Z6841 Body Mass Index (BMI) 40.0 and over, adult: Secondary | ICD-10-CM

## 2017-05-24 DIAGNOSIS — Z87891 Personal history of nicotine dependence: Secondary | ICD-10-CM | POA: Diagnosis not present

## 2017-05-24 DIAGNOSIS — K922 Gastrointestinal hemorrhage, unspecified: Secondary | ICD-10-CM | POA: Diagnosis present

## 2017-05-24 DIAGNOSIS — R0602 Shortness of breath: Secondary | ICD-10-CM | POA: Diagnosis not present

## 2017-05-24 DIAGNOSIS — I480 Paroxysmal atrial fibrillation: Secondary | ICD-10-CM | POA: Diagnosis not present

## 2017-05-24 DIAGNOSIS — K7460 Unspecified cirrhosis of liver: Secondary | ICD-10-CM | POA: Diagnosis present

## 2017-05-24 DIAGNOSIS — I10 Essential (primary) hypertension: Secondary | ICD-10-CM | POA: Diagnosis not present

## 2017-05-24 DIAGNOSIS — D649 Anemia, unspecified: Secondary | ICD-10-CM | POA: Diagnosis present

## 2017-05-24 DIAGNOSIS — I4819 Other persistent atrial fibrillation: Secondary | ICD-10-CM

## 2017-05-24 DIAGNOSIS — E876 Hypokalemia: Secondary | ICD-10-CM | POA: Diagnosis present

## 2017-05-24 DIAGNOSIS — I110 Hypertensive heart disease with heart failure: Secondary | ICD-10-CM | POA: Diagnosis not present

## 2017-05-24 DIAGNOSIS — I472 Ventricular tachycardia: Secondary | ICD-10-CM | POA: Diagnosis not present

## 2017-05-24 LAB — BASIC METABOLIC PANEL
Anion gap: 11 (ref 5–15)
BUN: 9 mg/dL (ref 6–20)
CO2: 26 mmol/L (ref 22–32)
Calcium: 9 mg/dL (ref 8.9–10.3)
Chloride: 103 mmol/L (ref 101–111)
Creatinine, Ser: 0.9 mg/dL (ref 0.44–1.00)
GFR calc Af Amer: >60 mL/min (ref >60)
GFR calc non Af Amer: >60 mL/min (ref >60)
Glucose, Bld: 186 mg/dL — ABNORMAL HIGH (ref 65–99)
Potassium: 4 mmol/L (ref 3.5–5.1)
Sodium: 140 mmol/L (ref 135–145)

## 2017-05-24 LAB — MAGNESIUM: Magnesium: 1.7 mg/dL (ref 1.7–2.4)

## 2017-05-24 LAB — BRAIN NATRIURETIC PEPTIDE: B Natriuretic Peptide: 186.7 pg/mL — ABNORMAL HIGH (ref 0.0–100.0)

## 2017-05-24 LAB — CBC
HCT: 30.7 % — ABNORMAL LOW (ref 36.0–46.0)
Hemoglobin: 8.7 g/dL — ABNORMAL LOW (ref 12.0–15.0)
MCH: 23.1 pg — ABNORMAL LOW (ref 26.0–34.0)
MCHC: 28.3 g/dL — ABNORMAL LOW (ref 30.0–36.0)
MCV: 81.4 fL (ref 78.0–100.0)
Platelets: 64 10*3/uL — ABNORMAL LOW (ref 150–400)
RBC: 3.77 MIL/uL — ABNORMAL LOW (ref 3.87–5.11)
RDW: 17.2 % — ABNORMAL HIGH (ref 11.5–15.5)
WBC: 2.9 10*3/uL — ABNORMAL LOW (ref 4.0–10.5)

## 2017-05-24 LAB — PROTIME-INR
INR: 1.29
Prothrombin Time: 16 seconds — ABNORMAL HIGH (ref 11.4–15.2)

## 2017-05-24 LAB — I-STAT TROPONIN, ED: Troponin i, poc: 0 ng/mL (ref 0.00–0.08)

## 2017-05-24 MED ORDER — METOPROLOL SUCCINATE ER 50 MG PO TB24
50.0000 mg | ORAL_TABLET | Freq: Every day | ORAL | Status: DC
  Administered 2017-05-25 – 2017-05-26 (×2): 50 mg via ORAL
  Filled 2017-05-24 (×2): qty 1

## 2017-05-24 MED ORDER — SPIRONOLACTONE 12.5 MG HALF TABLET
12.5000 mg | ORAL_TABLET | Freq: Every day | ORAL | Status: DC
  Administered 2017-05-25: 12.5 mg via ORAL
  Filled 2017-05-24: qty 1

## 2017-05-24 MED ORDER — ZOLPIDEM TARTRATE 5 MG PO TABS: 5.0000 mg | ORAL_TABLET | Freq: Every evening | ORAL | Status: DC | PRN

## 2017-05-24 MED ORDER — ONDANSETRON HCL 4 MG/2ML IJ SOLN: 4.0000 mg | Freq: Four times a day (QID) | INTRAMUSCULAR | Status: DC | PRN

## 2017-05-24 MED ORDER — ISOSORBIDE MONONITRATE ER 60 MG PO TB24
60.0000 mg | ORAL_TABLET | Freq: Every day | ORAL | Status: DC
  Administered 2017-05-25 – 2017-05-30 (×6): 60 mg via ORAL
  Filled 2017-05-24: qty 1
  Filled 2017-05-24 (×6): qty 2

## 2017-05-24 MED ORDER — ENOXAPARIN SODIUM 40 MG/0.4ML ~~LOC~~ SOLN
40.0000 mg | SUBCUTANEOUS | Status: DC
  Administered 2017-05-24 – 2017-05-25 (×2): 40 mg via SUBCUTANEOUS
  Filled 2017-05-24 (×3): qty 0.4

## 2017-05-24 MED ORDER — PANTOPRAZOLE SODIUM 40 MG PO TBEC
40.0000 mg | DELAYED_RELEASE_TABLET | Freq: Every day | ORAL | Status: DC
  Administered 2017-05-25 – 2017-05-30 (×6): 40 mg via ORAL
  Filled 2017-05-24 (×6): qty 1

## 2017-05-24 MED ORDER — ASPIRIN EC 81 MG PO TBEC
81.0000 mg | DELAYED_RELEASE_TABLET | Freq: Every day | ORAL | Status: DC
  Administered 2017-05-25 – 2017-05-28 (×4): 81 mg via ORAL
  Filled 2017-05-24 (×4): qty 1

## 2017-05-24 MED ORDER — ALPRAZOLAM 0.25 MG PO TABS
0.2500 mg | ORAL_TABLET | Freq: Two times a day (BID) | ORAL | Status: DC | PRN
  Administered 2017-05-24 – 2017-05-29 (×6): 0.25 mg via ORAL
  Filled 2017-05-24 (×6): qty 1

## 2017-05-24 MED ORDER — POLYETHYLENE GLYCOL 3350 17 G PO PACK
17.0000 g | PACK | Freq: Every day | ORAL | Status: DC
  Administered 2017-05-28 – 2017-05-30 (×3): 17 g via ORAL
  Filled 2017-05-24 (×6): qty 1

## 2017-05-24 MED ORDER — FUROSEMIDE 10 MG/ML IJ SOLN
80.0000 mg | Freq: Once | INTRAMUSCULAR | Status: AC
  Administered 2017-05-24: 80 mg via INTRAVENOUS
  Filled 2017-05-24: qty 8

## 2017-05-24 MED ORDER — NITROGLYCERIN 0.4 MG SL SUBL: 0.4000 mg | SUBLINGUAL_TABLET | SUBLINGUAL | Status: DC | PRN

## 2017-05-24 MED ORDER — ALBUTEROL SULFATE (2.5 MG/3ML) 0.083% IN NEBU: 2.5000 mg | INHALATION_SOLUTION | Freq: Four times a day (QID) | RESPIRATORY_TRACT | Status: DC | PRN

## 2017-05-24 MED ORDER — FLUOXETINE HCL 20 MG PO CAPS
40.0000 mg | ORAL_CAPSULE | Freq: Every day | ORAL | Status: DC
  Administered 2017-05-25 – 2017-05-30 (×6): 40 mg via ORAL
  Filled 2017-05-24 (×6): qty 2

## 2017-05-24 MED ORDER — EZETIMIBE 10 MG PO TABS
10.0000 mg | ORAL_TABLET | Freq: Every day | ORAL | Status: DC
  Administered 2017-05-24 – 2017-05-29 (×6): 10 mg via ORAL
  Filled 2017-05-24 (×6): qty 1

## 2017-05-24 MED ORDER — ATORVASTATIN CALCIUM 20 MG PO TABS
20.0000 mg | ORAL_TABLET | Freq: Every evening | ORAL | Status: DC
  Administered 2017-05-25 – 2017-05-29 (×5): 20 mg via ORAL
  Filled 2017-05-24 (×6): qty 1

## 2017-05-24 MED ORDER — IRBESARTAN 300 MG PO TABS
150.0000 mg | ORAL_TABLET | Freq: Every day | ORAL | Status: DC
  Administered 2017-05-25 – 2017-05-30 (×5): 150 mg via ORAL
  Filled 2017-05-24 (×6): qty 1

## 2017-05-24 MED ORDER — FUROSEMIDE 10 MG/ML IJ SOLN
80.0000 mg | Freq: Two times a day (BID) | INTRAMUSCULAR | Status: DC
  Administered 2017-05-25 – 2017-05-27 (×5): 80 mg via INTRAVENOUS
  Filled 2017-05-24 (×5): qty 8

## 2017-05-24 MED ORDER — FLUTICASONE PROPIONATE 50 MCG/ACT NA SUSP
1.0000 | Freq: Every day | NASAL | Status: DC | PRN
  Filled 2017-05-24: qty 16

## 2017-05-24 MED ORDER — CALCIUM CARBONATE ANTACID 500 MG PO CHEW
1.0000 | CHEWABLE_TABLET | Freq: Every day | ORAL | Status: DC | PRN
  Administered 2017-05-26: 200 mg via ORAL
  Filled 2017-05-24: qty 1

## 2017-05-24 MED ORDER — LORATADINE 10 MG PO TABS
10.0000 mg | ORAL_TABLET | Freq: Every day | ORAL | Status: DC
  Administered 2017-05-25 – 2017-05-30 (×6): 10 mg via ORAL
  Filled 2017-05-24 (×6): qty 1

## 2017-05-24 MED ORDER — SODIUM CHLORIDE 0.9% FLUSH
3.0000 mL | INTRAVENOUS | Status: DC | PRN
  Administered 2017-05-25: 3 mL via INTRAVENOUS
  Filled 2017-05-24: qty 3

## 2017-05-24 NOTE — ED Notes (Signed)
Heart Healthy Diet ordered  

## 2017-05-24 NOTE — Progress Notes (Signed)
Patient arrived to unit with unit RN, no concerns or complaints. Oriented to unit, safety measures in place.

## 2017-05-24 NOTE — ED Triage Notes (Signed)
Patient sent by Dr. Burt Knack to be admitted for CHF. Has gained 30lbs in 30 days, increased lasix with no relief. Speaking complete sentences, alert and oriented

## 2017-05-24 NOTE — ED Provider Notes (Addendum)
[facility] EMERGENCY DEPARTMENT Provider Note   CSN: [id number] Arrival date & time: 05/24/17  1021     History   Chief Complaint Chief Complaint  Patient presents with  . Shortness of Breath/CHF    HPI Samantha [name] is a 67 y.o. female.  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  Patient saw her cardiologist today for these symptoms.  She was sent to the emergency department for admission to the heart failure team.  She reports extreme shortness of breath walking across the room, dressing.  She cannot lie flat and has had trouble sleeping due to sitting up.  Patient states that she has been compliant with her Lasix but denies increased urinary output.  In fact she increased Lasix at request of her cardiologist the past 1-2 days. Per her cardiologist report, she has gained approximately 30 pounds over the past 1 month.  She denies chest pain or fever.  No nausea, vomiting, or diarrhea.  No abdominal pain.  The onset of this condition was acute. The course is constant. Aggravating factors: none. Alleviating factors: none.   Patient denies recent dark stools or blood in stools.  She has had blood in her stools in the past with bleeding varices.      Past Medical History:  Diagnosis Date  . Chronic systolic CHF (congestive heart failure) ([place])   . Cirrhosis of liver ([place])   . Coronary artery disease    a. s/p CABG 2001 w/ (LIMA-OM, SVG-D1, SVG-RCA). b. h/o multiple PCIs per Dr. Antionette Char note.  . Depression   . Esophageal varices ([place])    New 2013  . Gastroesophageal reflux disease   . History of pneumonia   . Hyperlipidemia   . Hypertension   . Obesity   . Osteoarthritis   . Paroxysmal atrial flutter ([place])    a. dx 05/2016.  [name] Persistent atrial fibrillation  ([place])    a. reported in hosp 07/2016, not on anticoag due to cirrhosis and liver disease, low platelets, varices.  . Thrombocytopenia [facility])     Patient Active Problem List   Diagnosis Date Noted  . Rectal pain 03/06/2017  . Other cirrhosis of liver ([place]) 10/26/2016  . Idiopathic esophageal varices without bleeding ([place]) 10/26/2016  . Persistent atrial fibrillation ([place]) 08/16/2016  . Cardiomyopathy, ischemic-35-40% by cath  01/11/2015  . Obesity 09/24/2013  . Unspecified hereditary and idiopathic peripheral neuropathy 01/10/2013  . Cirrhosis, non-alcoholic ([place]) 21/19/4174  . Chronic systolic heart failure ([place]) 01/16/2012  . Esophageal varices- Plavix stopped 12/23/2011  . GI bleed 12/23/2011  . Anemia 04/22/2011  . CAD (coronary artery disease) 08/21/2009  . Mixed hyperlipidemia 06/04/2008  . Essential hypertension 06/04/2008  . S/P CABG x 3- 2001 06/04/2008    Past Surgical History:  Procedure Laterality Date  . ABDOMINAL HYSTERECTOMY    . BACK SURGERY    . CARDIAC CATHETERIZATION  2004   left internal mammary artery to the obtuse marginal  was found to be small and thread like.  The two grafts were patent.  The left circumflex had 90% in-stent restenosis and cutting balloon angioplasty was performed followed by placement of a 3.0 x 52mm Taxus drug -eluting stent.    [name] CARDIAC CATHETERIZATION  2006   There was in-stent restenosis in the left circumflex and this was treated with  cutting balloon angioplasty   . CARDIAC CATHETERIZATION  2008   vein graft to the to the obtuse marginal was patent, although small, left circumflex had 40% in-stent restenosis, ejection fraction 40-45%.  The patient was medically mananged.  [name] CARDIAC CATHETERIZATION N/A 01/09/2015   Procedure: Left Heart Cath and Cors/Grafts Angiography;  Surgeon: Belva Crome, MD;  Location: [facility] LAB;  Service: Cardiovascular;  Laterality: N/A;  . COLONOSCOPY  03/29/2011   Procedure: COLONOSCOPY;  Surgeon:  Jamesetta So, MD;  [facility];  Service: Gastroenterology;  Laterality: N/A;  . CORONARY ARTERY BYPASS GRAFT  May 31,2001   x 3 with a vein graft to the first diagonal, vein graft to the right coronary  artery, and a free left internal mammary  artery to the obtuse marginal   . ESOPHAGEAL BANDING N/A 07/04/2012   Procedure: ESOPHAGEAL BANDING;  Surgeon: Rogene Houston, MD;  [facility];  Service: Endoscopy;  Laterality: N/A;  . ESOPHAGEAL BANDING N/A 09/17/2012   Procedure: ESOPHAGEAL BANDING;  Surgeon: Rogene Houston, MD;  [facility];  Service: Endoscopy;  Laterality: N/A;  . ESOPHAGEAL BANDING N/A 10/22/2013   Procedure: ESOPHAGEAL BANDING;  Surgeon: Rogene Houston, MD;  [facility];  Service: Endoscopy;  Laterality: N/A;  . ESOPHAGEAL BANDING N/A 11/28/2014   Procedure: ESOPHAGEAL BANDING;  Surgeon: Rogene Houston, MD;  [facility];  Service: Endoscopy;  Laterality: N/A;  . ESOPHAGEAL BANDING N/A 10/29/2015   Procedure: ESOPHAGEAL BANDING;  Surgeon: Rogene Houston, MD;  [facility];  Service: Endoscopy;  Laterality: N/A;  . ESOPHAGOGASTRODUODENOSCOPY  12/20/2011   Procedure: ESOPHAGOGASTRODUODENOSCOPY (EGD);  Surgeon: Jamesetta So, MD;  [facility];  Service: Gastroenterology;  Laterality: N/A;  . ESOPHAGOGASTRODUODENOSCOPY N/A 07/04/2012   Procedure: ESOPHAGOGASTRODUODENOSCOPY (EGD);  Surgeon: Rogene Houston, MD;  [facility];  Service: Endoscopy;  Laterality: N/A;  235-moved to 255 Ann to notify pt  . ESOPHAGOGASTRODUODENOSCOPY N/A 09/17/2012   Procedure: ESOPHAGOGASTRODUODENOSCOPY (EGD);  Surgeon: Rogene Houston, MD;  [facility];  Service: Endoscopy;  Laterality: N/A;  730  . ESOPHAGOGASTRODUODENOSCOPY N/A 10/22/2013   Procedure: ESOPHAGOGASTRODUODENOSCOPY (EGD);  Surgeon: Rogene Houston, MD;  [facility];  Service: Endoscopy;  Laterality: N/A;  730  .  ESOPHAGOGASTRODUODENOSCOPY N/A 11/28/2014   Procedure: ESOPHAGOGASTRODUODENOSCOPY (EGD);  Surgeon: Rogene Houston, MD;  [facility];  Service: Endoscopy;  Laterality: N/A;  1:25  . ESOPHAGOGASTRODUODENOSCOPY N/A 10/29/2015   Procedure: ESOPHAGOGASTRODUODENOSCOPY (EGD);  Surgeon: Rogene Houston, MD;  [facility];  Service: Endoscopy;  Laterality: N/A;  12:00  . ESOPHAGOGASTRODUODENOSCOPY N/A 12/12/2016   Procedure: ESOPHAGOGASTRODUODENOSCOPY (EGD);  Surgeon: Rogene Houston, MD;  [facility];  Service: Endoscopy;  Laterality: N/A;  225  . FLEXIBLE SIGMOIDOSCOPY N/A 03/20/2017   Procedure: FLEXIBLE SIGMOIDOSCOPY;  Surgeon: Rogene Houston, MD;  [facility];  Service: Endoscopy;  Laterality: N/A;  7:30  . JOINT REPLACEMENT    . LEFT HEART CATH AND CORS/GRAFTS ANGIOGRAPHY N/A 08/15/2016   Procedure: Left Heart Cath and Cors/Grafts Angiography;  Surgeon: Jettie Booze, MD;  Location: [facility] CV LAB;  Service: Cardiovascular;  Laterality: N/A;  . LEFT HEART CATHETERIZATION WITH CORONARY/GRAFT ANGIOGRAM N/A 12/25/2013   Procedure: LEFT HEART CATHETERIZATION WITH Beatrix Fetters;  Surgeon: Blane Ohara, MD;  Location: [facility] CATH LAB;  Service: Cardiovascular;  Laterality: N/A;  .  rotator cuff left  2007  . TONSILLECTOMY       OB History   None      Home Medications    Prior to Admission medications   Medication Sig Start Date End Date Taking? Authorizing Provider  albuterol (PROVENTIL HFA;VENTOLIN HFA) 108 (90 BASE) MCG/ACT inhaler Inhale 2 puffs into the lungs every 6 (six) hours as needed for wheezing or shortness of breath.     [provider]  atorvastatin (LIPITOR) 20 MG tablet Take 20 mg by mouth every evening.     [provider]  cetirizine (ZYRTEC) 10 MG tablet Take 10 mg by mouth daily as needed for allergies (during allergy season).     [provider]  ezetimibe (ZETIA) 10 MG tablet Take  10 mg by mouth at bedtime.     [provider]  FLUoxetine (PROZAC) 40 MG capsule Take 40 mg by mouth daily.     [provider]  fluticasone (FLONASE) 50 MCG/ACT nasal spray Place 1 spray into both nostrils daily as needed for allergies.  07/21/16   [provider]  furosemide (LASIX) 40 MG tablet Take 2 tablets (80 mg total) by mouth daily. 05/23/17   Sherren Mocha, MD  HYDROcodone-acetaminophen [facility]) 10-325 MG tablet Take 1 tablet by mouth every 6 (six) hours as needed (for pain.).     [provider]  hydrocortisone (ANUSOL-HC) 2.5 % rectal cream Place 1 application rectally 2 (two) times daily as needed for hemorrhoids or anal itching.    [provider]  isosorbide mononitrate (IMDUR) 60 MG 24 hr tablet TAKE ONE TABLET BY MOUTH ONCE DAILY 10/06/16   Sherren Mocha, MD  metoprolol succinate (TOPROL-XL) 50 MG 24 hr tablet Take 1 tablet (50 mg total) by mouth daily. Take with or immediately following a meal. 01/25/17 01/20/18  Sherren Mocha, MD  NITROSTAT 0.4 MG SL tablet Place 0.4 mg under the tongue every 5 (five) minutes as needed for chest pain (x 3 tabs daily).  11/15/13   [provider]  pantoprazole (PROTONIX) 40 MG tablet Take 1 tablet (40 mg total) by mouth 2 (two) times daily. 02/26/15   Sherren Mocha, MD  polyethylene glycol [facility] Floria Raveling) packet Take 17 g by mouth daily.    [provider]  spironolactone (ALDACTONE) 25 MG tablet Take 12.5 mg by mouth daily. 1/2 tablet (12.5mg  ) by mouth daily 02/14/17   Dunn, Nedra Hai, PA-C  valsartan (DIOVAN) 160 MG tablet Take 1 tablet (160 mg total) by mouth daily. 03/31/17 03/26/18  Sherren Mocha, MD    Family History Family History  Problem Relation Age of Onset  . Heart attack Mother   . Heart attack Father 66       cause of death  . Coronary artery disease Sister        CABG   . Colon cancer Neg Hx     Social History Social History   Tobacco Use  . Smoking  status: Former Smoker    Packs/day: 0.50    Years: 20.00    Pack years: 10.00    Types: Cigarettes    Last attempt to quit: 07/21/1995    Years since quitting: 21.8  . Smokeless tobacco: Never Used  Substance Use Topics  . Alcohol use: No  . Drug use: No     Allergies   Acetaminophen; Oxycodone; Ace inhibitors; Cefaclor; Cephalexin; Penicillins; Pregabalin; and Tape   Review of Systems Review of Systems  Constitutional: Negative for diaphoresis  and fever.  HENT: Negative for congestion.   Eyes: Negative for redness.  Respiratory: Positive for cough and shortness of breath.   Cardiovascular: Positive for leg swelling. Negative for chest pain and palpitations.  Gastrointestinal: Negative for abdominal pain, blood in stool, nausea and vomiting.  Genitourinary: Negative for dysuria.  Musculoskeletal: Negative for back pain and neck pain.  Skin: Negative for rash.  Neurological: Negative for syncope and light-headedness.  Psychiatric/Behavioral: The patient is not nervous/anxious.      Physical Exam Updated Vital Signs BP 121/63   Pulse 74   Temp 98.5 F (36.9 C) (Oral)   Resp 16   Ht 5\' 2"  (1.575 m)   Wt 100.2 kg (221 lb)   LMP 03/29/2011   SpO2 96%   BMI 40.42 kg/m   Physical Exam  Constitutional: She appears well-developed and well-nourished.  HENT:  Head: Normocephalic and atraumatic.  Mouth/Throat: Oropharynx is clear and moist and mucous membranes are normal. Mucous membranes are not dry.  Eyes: Conjunctivae are normal.  Neck: Trachea normal and normal range of motion. Neck supple. Normal carotid pulses and no JVD present. No muscular tenderness present. Carotid bruit is not present. No tracheal deviation present.  Cardiovascular: Normal rate, regular rhythm, S1 normal, S2 normal, normal heart sounds and intact distal pulses. Exam reveals no decreased pulses.  No murmur heard. Pulmonary/Chest: Effort normal. Tachypnea noted. No respiratory distress. She has no  wheezes. She exhibits no tenderness.  Mild tachynpea at rest.   Abdominal: Soft. Normal aorta and bowel sounds are normal. There is no tenderness. There is no rebound and no guarding.  Musculoskeletal: Normal range of motion. She exhibits edema.  1-2+ symmetric pitting edema to the knees bilaterally  Neurological: She is alert.  Skin: Skin is warm and dry. She is not diaphoretic. No cyanosis. No pallor.  Psychiatric: She has a normal mood and affect.  Nursing note and vitals reviewed.    ED Treatments / Results  Labs (all labs ordered are listed, but only abnormal results are displayed) Labs Reviewed  BASIC METABOLIC PANEL - Abnormal; Notable for the following components:      Result Value   Glucose, Bld 186 (*)    All other components within normal limits  CBC - Abnormal; Notable for the following components:   WBC 2.9 (*)    RBC 3.77 (*)    Hemoglobin 8.7 (*)    HCT 30.7 (*)    MCH 23.1 (*)    MCHC 28.3 (*)    RDW 17.2 (*)    Platelets 64 (*)    All other components within normal limits  PROTIME-INR - Abnormal; Notable for the following components:   Prothrombin Time 16.0 (*)    All other components within normal limits  BRAIN NATRIURETIC PEPTIDE - Abnormal; Notable for the following components:   B Natriuretic Peptide 186.7 (*)    All other components within normal limits  I-STAT TROPONIN, ED   ED ECG REPORT   Date: 05/24/2017  Rate: afib 70's  Rhythm: atrial fibrillation  QRS Axis: right  Intervals: normal  ST/T Wave abnormalities: nonspecific T wave changes  Conduction Disutrbances:nonspecific intraventricular conduction delay  Narrative Interpretation:   Old EKG Reviewed: unchanged  I have personally reviewed the EKG tracing and agree with the computerized printout as noted.   Radiology Dg Chest 2 View  Result Date: 05/24/2017 CLINICAL DATA:  Shortness of breath, cough, and chest congestion. 30 pound weight loss over the past month. History of CABG, CHF,  former smoker. EXAM: CHEST - 2 VIEW COMPARISON:  Chest x-ray dated February 21, 2017 FINDINGS: The lungs are well-expanded. The interstitial markings are coarse. There are areas of scarring or subsegmental atelectasis bilaterally. The cardiac silhouette is enlarged. The pulmonary vascularity is engorged. There is no pleural effusion. There is calcification in the wall of the aortic arch and descending thoracic aorta. There are post CABG changes. The bony thorax exhibits no acute abnormality. IMPRESSION: COPD with superimposed CHF with mild interstitial edema. No alveolar pneumonia. Thoracic aortic atherosclerosis. Electronically Signed   By: David  Martinique M.D.   On: 05/24/2017 11:06    Procedures Procedures (including critical care time)  Medications Ordered in ED Medications  furosemide (LASIX) injection 80 mg (has no administration in time range)     Initial Impression / Assessment and Plan / ED Course  I have reviewed the triage vital signs and the nursing notes.  Pertinent labs & imaging results that were available during my care of the patient were reviewed by me and considered in my medical decision making (see chart for details).     Patient seen and examined.  Reviewed cardiology note. Exam c/w CHF exacerbation. Awaiting access. IV lasix ordered, 80mg , which is patient's AM dose. Creatinine at baseline.   Vital signs reviewed and are as follows: BP 121/63   Pulse 74   Temp 98.5 F (36.9 C) (Oral)   Resp 16   Ht 5\' 2"  (1.575 m)   Wt 100.2 kg (221 lb)   LMP 03/29/2011   SpO2 96%   BMI 40.42 kg/m   Discussed with Dr. Jeanell Sparrow.   Spoke with Wannetta Sender, cardiology to see patient.   Final Clinical Impressions(s) / ED Diagnoses   Final diagnoses:  Acute on chronic congestive heart failure, unspecified heart failure type (HCC)  Anemia, unspecified type  Shortness of breath   Patient with CHF exacerbation, plan admit to cards for admit.   ED Discharge Orders    None         Carlisle Cater, PA-C 05/24/17 1345    Carlisle Cater, PA-C 05/24/17 1349    Pattricia Boss, MD 05/25/17 [phone]

## 2017-05-24 NOTE — ED Notes (Signed)
IV team at bedside 

## 2017-05-24 NOTE — H&P (Addendum)
Date:  05/24/2017   ID:  Samantha [name], DOB [birth year], MRN [id number]   PCP:  [name], MD      Cardiologist: Dr. Burt Knack   Chief Complaint: weight gain and SOB  History of Present Illness:    Samantha [name] is a 67 y.o. female CAD status post CABG in 2011 and multiple PCI afterwards, chronic systolic heart failure, persistent atrial fibrillation (she is not on anticoagulation or Plavix due to gastric varices) hypertension and hyperlipidemia added to my schedule for evaluation of weight gain and shortness of breath.   Last cardiac cath 07/2016 showed no significant change in coronary anatomic.  May need  EP evaluation for possible ICD placement given decline in EF.   Echocardiogram November 2018 showed mild left ventricular hypertrophy, LVEF of 30-35% with wall motion abnormality.  No significant valvular abnormality noted.  She did not tolerated Entresto as she had swelling of her eyes.   She was doing relatively well when last seen by Dr. Burt Knack April 28, 2017.  He recommended echo in 6 months.   Here today for evaluation of weight gain and shortness of breath. She has dyspnea with minimal ambulation. Admits to having orthopnea, PND and LE edema. She has gained 4lb overnight despite taking extra lasix yesterday.  She has gained 13 pounds based on our scale since last office visit 3 weeks ago.  She is compliant with low-sodium diet and fluid restriction.  Compliant with medication.  She is severely dyspneic that she cannot walk few steps.  She felt chest heaviness yesterday when trying to bend over.  She also has cough but no congestion or fever.  She denies syncope or melena.   Past Medical History:  Diagnosis Date  . Chronic systolic CHF (congestive heart failure) ([place])   . Cirrhosis of liver ([place])   . Coronary artery disease    a. s/p CABG 2001 w/ (LIMA-OM, SVG-D1, SVG-RCA). b. h/o multiple PCIs per Dr. Antionette Char note.  . Depression   . Esophageal varices ([place])    New  2013  . Gastroesophageal reflux disease   . History of pneumonia   . Hyperlipidemia   . Hypertension   . Obesity   . Osteoarthritis   . Paroxysmal atrial flutter ([place])    a. dx 05/2016.  [name] Persistent atrial fibrillation ([place])    a. reported in hosp 07/2016, not on anticoag due to cirrhosis and liver disease, low platelets, varices.  . Thrombocytopenia ([place])            Past Surgical History:  Procedure Laterality Date  . ABDOMINAL HYSTERECTOMY    . BACK SURGERY    . CARDIAC CATHETERIZATION  2004   left internal mammary artery to the obtuse marginal  was found to be small and thread like.  The two grafts were patent.  The left circumflex had 90% in-stent restenosis and cutting balloon angioplasty was performed followed by placement of a 3.0 x 17m Taxus drug -eluting stent.    .Marland KitchenCARDIAC CATHETERIZATION  2006   There was in-stent restenosis in the left circumflex and this was treated with cutting balloon angioplasty   . CARDIAC CATHETERIZATION  2008   vein graft to the to the obtuse marginal was patent, although small, left circumflex had 40% in-stent restenosis, ejection fraction 40-45%.  The patient was medically mananged.  .Marland KitchenCARDIAC CATHETERIZATION N/A 01/09/2015   Procedure: Left Heart Cath and Cors/Grafts Angiography;  Surgeon: HBelva Crome MD;  Location: [facility];  Service: Cardiovascular;  Laterality: N/A;  . COLONOSCOPY  03/29/2011   Procedure: COLONOSCOPY;  Surgeon: Jamesetta So, MD;  [facility];  Service: Gastroenterology;  Laterality: N/A;  . CORONARY ARTERY BYPASS GRAFT  May 31,2001   x 3 with a vein graft to the first diagonal, vein graft to the right coronary  artery, and a free left internal mammary  artery to the obtuse marginal   . ESOPHAGEAL BANDING N/A 07/04/2012   Procedure: ESOPHAGEAL BANDING;  Surgeon: Rogene Houston, MD;  [facility];  Service: Endoscopy;  Laterality: N/A;  . ESOPHAGEAL BANDING N/A  09/17/2012   Procedure: ESOPHAGEAL BANDING;  Surgeon: Rogene Houston, MD;  [facility];  Service: Endoscopy;  Laterality: N/A;  . ESOPHAGEAL BANDING N/A 10/22/2013   Procedure: ESOPHAGEAL BANDING;  Surgeon: Rogene Houston, MD;  [facility];  Service: Endoscopy;  Laterality: N/A;  . ESOPHAGEAL BANDING N/A 11/28/2014   Procedure: ESOPHAGEAL BANDING;  Surgeon: Rogene Houston, MD;  [facility];  Service: Endoscopy;  Laterality: N/A;  . ESOPHAGEAL BANDING N/A 10/29/2015   Procedure: ESOPHAGEAL BANDING;  Surgeon: Rogene Houston, MD;  [facility];  Service: Endoscopy;  Laterality: N/A;  . ESOPHAGOGASTRODUODENOSCOPY  12/20/2011   Procedure: ESOPHAGOGASTRODUODENOSCOPY (EGD);  Surgeon: Jamesetta So, MD;  [facility];  Service: Gastroenterology;  Laterality: N/A;  . ESOPHAGOGASTRODUODENOSCOPY N/A 07/04/2012   Procedure: ESOPHAGOGASTRODUODENOSCOPY (EGD);  Surgeon: Rogene Houston, MD;  [facility];  Service: Endoscopy;  Laterality: N/A;  235-moved to 255 Ann to notify pt  . ESOPHAGOGASTRODUODENOSCOPY N/A 09/17/2012   Procedure: ESOPHAGOGASTRODUODENOSCOPY (EGD);  Surgeon: Rogene Houston, MD;  [facility];  Service: Endoscopy;  Laterality: N/A;  730  . ESOPHAGOGASTRODUODENOSCOPY N/A 10/22/2013   Procedure: ESOPHAGOGASTRODUODENOSCOPY (EGD);  Surgeon: Rogene Houston, MD;  [facility];  Service: Endoscopy;  Laterality: N/A;  730  . ESOPHAGOGASTRODUODENOSCOPY N/A 11/28/2014   Procedure: ESOPHAGOGASTRODUODENOSCOPY (EGD);  Surgeon: Rogene Houston, MD;  [facility];  Service: Endoscopy;  Laterality: N/A;  1:25  . ESOPHAGOGASTRODUODENOSCOPY N/A 10/29/2015   Procedure: ESOPHAGOGASTRODUODENOSCOPY (EGD);  Surgeon: Rogene Houston, MD;  [facility];  Service: Endoscopy;  Laterality: N/A;  12:00  . ESOPHAGOGASTRODUODENOSCOPY N/A 12/12/2016   Procedure: ESOPHAGOGASTRODUODENOSCOPY (EGD);   Surgeon: Rogene Houston, MD;  [facility];  Service: Endoscopy;  Laterality: N/A;  225  . FLEXIBLE SIGMOIDOSCOPY N/A 03/20/2017   Procedure: FLEXIBLE SIGMOIDOSCOPY;  Surgeon: Rogene Houston, MD;  [facility];  Service: Endoscopy;  Laterality: N/A;  7:30  . JOINT REPLACEMENT    . LEFT HEART CATH AND CORS/GRAFTS ANGIOGRAPHY N/A 08/15/2016   Procedure: Left Heart Cath and Cors/Grafts Angiography;  Surgeon: Jettie Booze, MD;  Location: [facility];  Service: Cardiovascular;  Laterality: N/A;  . LEFT HEART CATHETERIZATION WITH CORONARY/GRAFT ANGIOGRAM N/A 12/25/2013   Procedure: LEFT HEART CATHETERIZATION WITH Beatrix Fetters;  Surgeon: Blane Ohara, MD;  Location: [facility];  Service: Cardiovascular;  Laterality: N/A;  . rotator cuff left  2007  . TONSILLECTOMY      Current Medications:  albuterol (PROVENTIL HFA;VENTOLIN HFA) 108 (90 BASE) MCG/ACT inhaler  Inhale 2 puffs into the lungs every 6 (six) hours as needed for wheezing or shortness of breath.    atorvastatin (LIPITOR) 20 MG tablet  Take 20 mg by mouth every evening.    cetirizine (  ZYRTEC) 10 MG tablet  Take 10 mg by mouth daily as needed for allergies (during allergy season).    ezetimibe (ZETIA) 10 MG tablet  Take 10 mg by mouth at bedtime.    FLUoxetine (PROZAC) 40 MG capsule  Take 40 mg by mouth daily.    fluticasone (FLONASE) 50 MCG/ACT nasal spray  Place 1 spray into both nostrils daily as needed for allergies.    furosemide (LASIX) 40 MG tablet  Take 80 mg by mouth daily.    HYDROcodone-acetaminophen (NORCO) 10-325 MG tablet  Take 1 tablet by mouth every 6 (six) hours as needed (for pain.).    isosorbide mononitrate (IMDUR) 60 MG 24 hr tablet  daily  metoprolol succinate (TOPROL-XL) 50 MG 24 hr tablet  Take 1 tablet (50 mg total) by mouth daily. Take with or immediately following a meal.   NITROSTAT 0.4 MG SL tablet  Place 0.4 mg under the tongue  every 5 (five) minutes as needed for chest pain (x 3 tabs daily).    pantoprazole (PROTONIX) 40 MG tablet  Take 1 tablet (40 mg total) by mouth 2 (two) times daily.   polyethylene glycol (MIRALAX / GLYCOLAX) packet  Take 17 g by mouth daily  spironolactone (ALDACTONE) 25 MG tablet  Take 12.5 mg by mouth daily. 1/2 tablet (12.85m ) by mouth daily  valsartan (DIOVAN) 160 MG tablet  Take 1 tablet (160 mg total) by mouth daily.    Allergies:   Acetaminophen; Oxycodone; Ace inhibitors; Cefaclor; Cephalexin; Penicillins; Pregabalin; and Tape    Social History divorced Smoking status:  Former Smoker  1/2 Packs/day:  20.00 years Pack years:  10.00   Social History Narrative  [place]     Family History:  The patient's family history includes Coronary artery disease in her sister; Heart attack in her mother; Heart attack (age of onset: 720 in her father.    ROS:    Please see the history of present illness.    ROS All other systems reviewed and are negative.  PHYSICAL EXAM:   VS:  BP (!) 112/54   Pulse 69   Ht 5' 2"  (1.575 m)   Wt 221 lb 12.8 oz (100.6 kg)   LMP 03/29/2011   SpO2 96%   BMI 40.57 kg/m    GEN: Well nourished, well developed, in no acute distress  HEENT: normal  Neck: , carotid bruits, or masses,  VD elevated up to her neck Cardiac: RRR; no murmurs, rubs, or gallops, bilateral lower extremity edema 3+ extending up to her belly Respiratory:  clear to auscultation bilaterally, normal work of breathing GI: soft, nontender, distended, + BS MS: no deformity or atrophy  Skin: warm and dry, no rash Neuro:  Alert and Oriented x 3, Strength and sensation are intact Psych: euthymic mood, full affect    Wgts  05/24/17  221 lb 12.8 oz (100.6 kg)   04/28/17  208 lb 12.8 oz (94.7 kg)   04/25/17  211 lb 3.2 oz (95.8 kg)   Studies/Labs Reviewed:   EKG:  EKG is not ordered today.     Recent Labs:  08/14/2016: B Natriuretic Peptide  104.0  02/14/2017: ALT 15  03/03/2017: BUN 10; Creatinine, Ser 0.93; Potassium 3.9; Sodium 145  04/25/2017: Hemoglobin 10.3; Platelets 96    Additional studies/ records that were reviewed today include:   Echo 01-25-2017: Study Conclusions  - Left ventricle: Wall thickness was increased in a pattern of mild LVH. Inteterminant diastolic function (atrial  fibrillation). Systolic function was moderately to severely reduced. The estimated ejection fraction was in the range of 30% to 35%. Septal-lateral dyssynchrony. Severe hypokinesis of the inferior, inferolateral, and anterolateral walls. - Aortic valve: There was no stenosis. - Mitral valve: Mildly calcified annulus. There was trivial regurgitation. - Left atrium: The atrium was mildly dilated. - Right ventricle: The cavity size was mildly dilated. Systolic function was moderately reduced. - Right atrium: The atrium was mildly dilated. - Tricuspid valve: Peak RV-RA gradient (S): 24 mm Hg. - Pulmonary arteries: PA peak pressure: 27 mm Hg (S). - Inferior vena cava: The vessel was normal in size. The respirophasic diameter changes were in the normal range (= 50%), consistent with normal central venous pressure.  Impressions: - The patient appeared to be in atrial fibrillation. Normal LV size with mild LV hypertrophy. EF 30-35% with wall motion abnormalities as noted above. Mildly dilated RV with moderately decreased systolic function. No significant valvular abnormalities.     Left Heart Cath and Cors/Grafts Angiography  07/2016  Conclusion  .Ost 1st Diag to 1st Diag lesion, 65 %stenosed. SVG to diagonal is patent. Marland KitchenLIMA attached to SVG to diagonal as a Y graft. LIMA is atretic. Marland KitchenProx RCA to Mid RCA lesion, 95 %stenosed. SVG to RCA is patent. Colon Flattery Cx to Mid Cx lesion, 40 %stenosed. .The left ventricular ejection fraction is 25-35% by visual estimate. .There is moderate to severe left ventricular systolic  dysfunction. .LV end diastolic pressure is moderately elevated. .There is no aortic valve stenosis.  No significant change in coronary circulation.  EF has decreased from prior report, with more pronounced inferior hypokinesis.  May need EP f/u for possible AICD placement.    ASSESSMENT & PLAN:   1.Acute on chronic combined CHF/ICM -She has gained 13 pounds in the past 3 weeks.  Overnight 4lb.  No improvement despite increase Lasix yesterday.  Decreased urine output.  Barely able to walk or breathe.  Patient was seen and examined by Dr. Burt Knack as well. significant volume overload on exam.  Initial plan was to directly admit and evaluation by CHF team however no bed available.  We have sent patient to ER and will be admitted by CHF team.    2.  CAD - No angina.   3.  Hypertension - Stable on current medications.    4.  Hyperlipidemia - 08/15/2016: Cholesterol 143; HDL 65; LDL Cholesterol 68; Triglycerides 50; VLDL 10  - Continue statin.   5. Persistent atrial fibrilaltion - she is not on anticoagulation or Plavix due to gastric varices. Rate controlled.       Plan: PT IS BEING ADMITTED TO TELEMETRY FOR CHF    Jarrett Soho, PA   05/24/2017 9:12 AM    [address], [place], [place]  [address] Phone: [phone]; Fax: [phone]      Cosigned by: Sherren Mocha, MD at 05/24/2017  2:59 PM   Pt seen with Robbie Lis today in the office. I supervised the visit and recommended hospital admission for treatment of acute on chronic systolic heart failure. The patient is well-known to me. Has had a difficult time with progressive CHF, persistent atrial fibrillation, and limited treatment options because of cirrhosis with esophageal and gastric varices. Atrial fibrillation has been rate controlled with anticoagulation contraindicated in the setting of varices. I have discussed her case recently with Dr Laural Golden.   She now presents with  progressive dyspnea, orthopnea, PND, and weight gain on  a combination of furosemide and low-dose aldactone. Exam is pertinent for JVD to the angle of the mandible, decreased breath sounds in both lower lung fields, heart irregular irregular without murmur, abdominal distention without mass, rebound, or guarding, and 2+ BL pretibial edema.   Plan: hospital admission for IV diuresis. Anticipate change diuretic regimen to torsemide and increase aldactone to 25 possibly 50 mg. Advanced HF consultation. Continue rate control strategy for AF.  Otherwise as outlined above.   Sherren Mocha 05/24/2017 10:01 PM

## 2017-05-24 NOTE — Patient Instructions (Signed)
Your physician recommends that you continue on your current medications as directed. Please refer to the Current Medication list given to you today.  Your physician recommends that you schedule a follow-up appointment in:  PT IS BEING ADMITTED TO TELEMETRY FOR CHF

## 2017-05-24 NOTE — ED Notes (Signed)
Requested meds to be verified.

## 2017-05-24 NOTE — ED Notes (Signed)
IV team continue to be at bedside.

## 2017-05-25 ENCOUNTER — Inpatient Hospital Stay (HOSPITAL_COMMUNITY): Payer: Medicare Other

## 2017-05-25 ENCOUNTER — Other Ambulatory Visit: Payer: Self-pay

## 2017-05-25 DIAGNOSIS — I480 Paroxysmal atrial fibrillation: Secondary | ICD-10-CM

## 2017-05-25 DIAGNOSIS — I509 Heart failure, unspecified: Secondary | ICD-10-CM

## 2017-05-25 LAB — TROPONIN I
Troponin I: <0.03 ng/mL (ref <0.03)
Troponin I: <0.03 ng/mL (ref <0.03)
Troponin I: <0.03 ng/mL (ref <0.03)

## 2017-05-25 LAB — CBC
HCT: 31.4 % — ABNORMAL LOW (ref 36.0–46.0)
Hemoglobin: 9.2 g/dL — ABNORMAL LOW (ref 12.0–15.0)
MCH: 23.9 pg — ABNORMAL LOW (ref 26.0–34.0)
MCHC: 29.3 g/dL — ABNORMAL LOW (ref 30.0–36.0)
MCV: 81.6 fL (ref 78.0–100.0)
Platelets: 61 10*3/uL — ABNORMAL LOW (ref 150–400)
RBC: 3.85 MIL/uL — ABNORMAL LOW (ref 3.87–5.11)
RDW: 17.6 % — ABNORMAL HIGH (ref 11.5–15.5)
WBC: 2.9 10*3/uL — ABNORMAL LOW (ref 4.0–10.5)

## 2017-05-25 LAB — HEPATIC FUNCTION PANEL
ALT: 14 U/L (ref 14–54)
AST: 41 U/L (ref 15–41)
Albumin: 3.8 g/dL (ref 3.5–5.0)
Alkaline Phosphatase: 58 U/L (ref 38–126)
Bilirubin, Direct: 0.2 mg/dL (ref 0.1–0.5)
Indirect Bilirubin: 0.8 mg/dL (ref 0.3–0.9)
Total Bilirubin: 1 mg/dL (ref 0.3–1.2)
Total Protein: 7.3 g/dL (ref 6.5–8.1)

## 2017-05-25 LAB — BASIC METABOLIC PANEL
Anion gap: 13 (ref 5–15)
BUN: 13 mg/dL (ref 6–20)
CO2: 27 mmol/L (ref 22–32)
Calcium: 8.5 mg/dL — ABNORMAL LOW (ref 8.9–10.3)
Chloride: 100 mmol/L — ABNORMAL LOW (ref 101–111)
Creatinine, Ser: 0.99 mg/dL (ref 0.44–1.00)
GFR calc Af Amer: >60 mL/min (ref >60)
GFR calc non Af Amer: 57 mL/min — ABNORMAL LOW (ref >60)
Glucose, Bld: 109 mg/dL — ABNORMAL HIGH (ref 65–99)
Potassium: 3 mmol/L — ABNORMAL LOW (ref 3.5–5.1)
Sodium: 140 mmol/L (ref 135–145)

## 2017-05-25 LAB — ECHOCARDIOGRAM COMPLETE
Height: 62 in
Weight: 3475.2 oz

## 2017-05-25 LAB — TSH: TSH: 0.704 u[IU]/mL (ref 0.350–4.500)

## 2017-05-25 MED ORDER — MAGNESIUM SULFATE 4 GM/100ML IV SOLN
4.0000 g | Freq: Once | INTRAVENOUS | Status: AC
  Administered 2017-05-25: 4 g via INTRAVENOUS
  Filled 2017-05-25: qty 100

## 2017-05-25 MED ORDER — POTASSIUM CHLORIDE CRYS ER 20 MEQ PO TBCR
40.0000 meq | EXTENDED_RELEASE_TABLET | Freq: Two times a day (BID) | ORAL | Status: AC
  Administered 2017-05-25 (×2): 40 meq via ORAL
  Filled 2017-05-25 (×2): qty 2

## 2017-05-25 MED ORDER — DIGOXIN 125 MCG PO TABS
0.1250 mg | ORAL_TABLET | Freq: Every day | ORAL | Status: DC
  Administered 2017-05-25: 0.125 mg via ORAL
  Filled 2017-05-25: qty 1

## 2017-05-25 MED ORDER — SPIRONOLACTONE 25 MG PO TABS
25.0000 mg | ORAL_TABLET | Freq: Every day | ORAL | Status: DC
  Administered 2017-05-26 – 2017-05-30 (×5): 25 mg via ORAL
  Filled 2017-05-25 (×5): qty 1

## 2017-05-25 MED ORDER — SPIRONOLACTONE 12.5 MG HALF TABLET
12.5000 mg | ORAL_TABLET | Freq: Once | ORAL | Status: AC
  Administered 2017-05-25: 12.5 mg via ORAL
  Filled 2017-05-25: qty 1

## 2017-05-25 MED ORDER — GUAIFENESIN-DM 100-10 MG/5ML PO SYRP
5.0000 mL | ORAL_SOLUTION | ORAL | Status: DC | PRN
  Administered 2017-05-25: 5 mL via ORAL
  Filled 2017-05-25: qty 5

## 2017-05-25 MED ORDER — METOLAZONE 2.5 MG PO TABS
2.5000 mg | ORAL_TABLET | Freq: Once | ORAL | Status: AC
  Administered 2017-05-25: 2.5 mg via ORAL
  Filled 2017-05-25: qty 1

## 2017-05-25 MED ORDER — HYDROCODONE-ACETAMINOPHEN 10-325 MG PO TABS
1.0000 | ORAL_TABLET | Freq: Four times a day (QID) | ORAL | Status: DC | PRN
  Administered 2017-05-25 – 2017-05-29 (×6): 1 via ORAL
  Filled 2017-05-25 (×6): qty 1

## 2017-05-25 NOTE — Progress Notes (Signed)
   Paged with [facility]. Pt has h/o of gastric varices.  Last bleed in December.   She says she has occasional episodes that are self limited.  Follow. If re-curs will likely need GI input. CBC stable this am. Follow tomorrow.   Had Flex Sig 03/20/17 with Dr. Laural Golden with external hemorrhoids but otherwise unremarkable.    Legrand Como [address] [place], PA-C 05/25/2017 1:20 PM

## 2017-05-25 NOTE — Consult Note (Addendum)
Advanced Heart Failure Team Consult Note   Primary Physician: [name], MD Primary Cardiologist:  Sherren Mocha, MD  Reason for Consultation: A/C combined HF  HPI:    Samantha [name] is seen today for evaluation of A/C combined HF at the request of Dr Burt Knack.  Samantha [name] is a 67 y.o. female with a history of CAD s/p CABG in 2011 and multiple PCI afterwards, chronic systolic heart failure, persistent atrial fibrillation diagnosed 07/2016 (she is not on anticoagulation or Plavix due to gastric varices), hypertension, non-ETOH cirrhosis, DM, obesity, esophageal varices requiring banding, gastric varices, and hyperlipidemia.  She was admitted 04/4399 for A/C systolic HF requiring IV diuresis. She was found to be in atrial fibrillation and was not started on [place] due to esophageal varices, cirrhosis, and low platelets. She had a LHC that showed no change in coronary circulation and EF 25-35%.   She was admitted from [facility] clinic on 3/27 with worsening SOB and weight gain despite taking extra lasix. She reports a 10 lb weight gain and decreased UOP over the last 10 days. She takes 80 mg PO lasix daily and took an extra dose on Monday with no improvement. SOB with minimal walking. She reports orthopnea, PND, peripheral edema, abdominal distension, and facial edema. She has a chronic nonproductive cough that has been worse recently and had a fever with chills to 103 one week ago that resolved overnight. She also reports having chest pressure that feels similar to her pre-CABG CP. It has been occurring daily for the last week and lasts a few minutes. It occurs with activity and resolves with rest. She has not taken any nitro. She has palpitations with her afib, but reports they have been worse recently. She has been compliant with her medications and limits her fluid and salt intake.   Pertinent admission labs include: Na 140, K 4.0, creatinine 0.90, BNP 188, WBC 2.9, hemoglobin 8.7,  platelets 64, magnesium 1.7, troponin 0.0 CXR: COPD with superimposed CHF with mild interstitial edema. No alveolar pneumonia.  She has diuresed 4 lbs with 80 mg IV lasix x1 so far. She feels slightly better. No CP.   Lives alone. No issues with transportation or getting medications. Manages own medications. She is a former smoker 30+ years ago. No current alcohol, tobacco, or drugs.   LHC 07/2016:  Ost 1st Diag to 1st Diag lesion, 65 %stenosed. SVG to diagonal is patent.  LIMA attached to SVG to diagonal as a Y graft. LIMA is atretic.  Prox RCA to Mid RCA lesion, 95 %stenosed. SVG to RCA is patent.  Ost Cx to Mid Cx lesion, 40 %stenosed.  The left ventricular ejection fraction is 25-35% by visual estimate.  There is moderate to severe left ventricular systolic dysfunction.  LV end diastolic pressure is moderately elevated.  There is no aortic valve stenosis. No significant change in coronary circulation.  EF has decreased from prior report, with more pronounced inferior hypokinesis.  May need EP f/u for possible AICD placement.  Echo 12/2016: - Left ventricle: Wall thickness was increased in a pattern of mild   LVH. Inteterminant diastolic function (atrial fibrillation).   Systolic function was moderately to severely reduced. The   estimated ejection fraction was in the range of 30% to 35%.   Septal-lateral dyssynchrony. Severe hypokinesis of the inferior,   inferolateral, and anterolateral walls. - Aortic valve: There was no stenosis. - Mitral valve: Mildly calcified annulus. There was trivial   regurgitation. -  Left atrium: The atrium was mildly dilated. - Right ventricle: The cavity size was mildly dilated. Systolic   function was moderately reduced. - Right atrium: The atrium was mildly dilated. - Tricuspid valve: Peak RV-RA gradient (S): 24 mm Hg. - Pulmonary arteries: PA peak pressure: 27 mm Hg (S). - Inferior vena cava: The vessel was normal in size. The    respirophasic diameter changes were in the normal range (= 50%),   consistent with normal central venous pressure.  Review of Systems: [y] = yes, [ ]  = no   General: Weight gain Blue.Reese ]; Weight loss [ ] ; Anorexia [ ] ; Fatigue [ ] ; Fever [ y]; Chills Blue.Reese ]; Weakness [ ]   Cardiac: Chest pain/pressure Blue.Reese ]; Resting SOB [ ] ; Exertional SOB [ y]; Orthopnea Blue.Reese ]; Pedal Edema Blue.Reese ]; Palpitations Blue.Reese ]; Syncope [ ] ; Presyncope [ ] ; Paroxysmal nocturnal dyspnea[y ]  Pulmonary: Cough Blue.Reese ]; [facility] ]; Hemoptysis[ ] ; Sputum [ ] ; Snoring [ ]   GI: Vomiting[ ] ; Dysphagia[ ] ; Melena[ ] ; Hematochezia [ ] ; Heartburn[ ] ; Abdominal pain [ ] ; Constipation [ ] ; Diarrhea [ ] ; BRBPR [ ]   GU: Hematuria[ ] ; Dysuria [ ] ; Nocturia[ ]   Vascular: Pain in legs with walking [ ] ; Pain in feet with lying flat [ ] ; Non-healing sores [ ] ; Stroke [ ] ; TIA [ ] ; Slurred speech [ ] ;  Neuro: Headaches[ ] ; Vertigo[ ] ; Seizures[ ] ; Paresthesias[ ] ;Blurred vision [ ] ; Diplopia [ ] ; Vision changes [ ]   Ortho/Skin: Arthritis Blue.Reese ]; Joint pain [ y]; Muscle pain [ ] ; Joint swelling [ ] ; Back Pain [ ] ; Rash [ ]   Psych: Depression[ ] ; Anxiety[ ]   Heme: Bleeding problems [ ] ; Clotting disorders [ ] ; Anemia Blue.Reese ]  Endocrine: Diabetes [ ] ; Thyroid dysfunction[ ]   Home Medications Prior to Admission medications   Medication Sig Start Date End Date Taking? Authorizing Provider  albuterol (PROVENTIL HFA;VENTOLIN HFA) 108 (90 BASE) MCG/ACT inhaler Inhale 2 puffs into the lungs every 6 (six) hours as needed for wheezing or shortness of breath.    Yes [provider]  atorvastatin (LIPITOR) 20 MG tablet Take 20 mg by mouth every evening.    Yes [provider]  calcium carbonate (TUMS - DOSED IN MG ELEMENTAL CALCIUM) 500 MG chewable tablet Chew 1 tablet by mouth daily as needed for indigestion or heartburn.   Yes [provider]  cetirizine (ZYRTEC) 10 MG tablet Take 10 mg by mouth daily as needed for allergies (during allergy  season).    Yes [provider]  ezetimibe (ZETIA) 10 MG tablet Take 10 mg by mouth at bedtime.    Yes [provider]  FLUoxetine (PROZAC) 40 MG capsule Take 40 mg by mouth daily.    Yes [provider]  fluticasone (FLONASE) 50 MCG/ACT nasal spray Place 1 spray into both nostrils daily as needed for allergies.  07/21/16  Yes [provider]  furosemide (LASIX) 40 MG tablet Take 2 tablets (80 mg total) by mouth daily. 05/23/17  Yes Sherren Mocha, MD  HYDROcodone-acetaminophen [facility]) 10-325 MG tablet Take 1 tablet by mouth every 6 (six) hours as needed (for pain.).    Yes [provider]  isosorbide mononitrate (IMDUR) 60 MG 24 hr tablet TAKE ONE TABLET BY MOUTH ONCE DAILY 10/06/16  Yes Sherren Mocha, MD  metoprolol succinate (TOPROL-XL) 50 MG 24 hr tablet Take 1 tablet (50 mg total) by mouth daily. Take with or immediately following a meal. 01/25/17 01/20/18 Yes Cooper,  Legrand Como, MD  pantoprazole (PROTONIX) 40 MG tablet Take 1 tablet (40 mg total) by mouth 2 (two) times daily. Patient taking differently: Take 40 mg by mouth daily.  02/26/15  Yes Sherren Mocha, MD  polyethylene glycol [facility] Floria Raveling) packet Take 17 g by mouth daily.   Yes [provider]  spironolactone (ALDACTONE) 25 MG tablet Take 12.5 mg by mouth daily.  02/14/17  Yes Dunn, Dayna N, PA-C  valsartan (DIOVAN) 160 MG tablet Take 1 tablet (160 mg total) by mouth daily. 03/31/17 03/26/18 Yes Sherren Mocha, MD  NITROSTAT 0.4 MG SL tablet Place 0.4 mg under the tongue every 5 (five) minutes as needed for chest pain (x 3 tabs daily).  11/15/13   [provider]    Past Medical History: Past Medical History:  Diagnosis Date  . Chronic systolic CHF (congestive heart failure) ([place])   . Cirrhosis of liver ([place])   . Coronary artery disease    a. s/p CABG 2001 w/ (LIMA-OM, SVG-D1, SVG-RCA). b. h/o multiple PCIs per Dr. Antionette Char note.  . Depression   . Esophageal varices  ([place])    New 2013  . Gastroesophageal reflux disease   . History of pneumonia   . Hyperlipidemia   . Hypertension   . Obesity   . Osteoarthritis   . Paroxysmal atrial flutter ([place])    a. dx 05/2016.  [name] Persistent atrial fibrillation ([place])    a. reported in hosp 07/2016, not on anticoag due to cirrhosis and liver disease, low platelets, varices.  . Thrombocytopenia ([place])     Past Surgical History: Past Surgical History:  Procedure Laterality Date  . ABDOMINAL HYSTERECTOMY    . BACK SURGERY    . CARDIAC CATHETERIZATION  2004   left internal mammary artery to the obtuse marginal  was found to be small and thread like.  The two grafts were patent.  The left circumflex had 90% in-stent restenosis and cutting balloon angioplasty was performed followed by placement of a 3.0 x 49m Taxus drug -eluting stent.    .Marland KitchenCARDIAC CATHETERIZATION  2006   There was in-stent restenosis in the left circumflex and this was treated with cutting balloon angioplasty   . CARDIAC CATHETERIZATION  2008   vein graft to the to the obtuse marginal was patent, although small, left circumflex had 40% in-stent restenosis, ejection fraction 40-45%.  The patient was medically mananged.  .Marland KitchenCARDIAC CATHETERIZATION N/A 01/09/2015   Procedure: Left Heart Cath and Cors/Grafts Angiography;  Surgeon: HBelva Crome MD;  [facility];  Service: Cardiovascular;  Laterality: N/A;  . COLONOSCOPY  03/29/2011   Procedure: COLONOSCOPY;  Surgeon: MJamesetta So MD;  [facility];  Service: Gastroenterology;  Laterality: N/A;  . CORONARY ARTERY BYPASS GRAFT  May 31,2001   x 3 with a vein graft to the first diagonal, vein graft to the right coronary  artery, and a free left internal mammary  artery to the obtuse marginal   . ESOPHAGEAL BANDING N/A 07/04/2012   Procedure: ESOPHAGEAL BANDING;  Surgeon: NRogene Houston MD;  [facility];  Service: Endoscopy;  Laterality: N/A;  . ESOPHAGEAL BANDING N/A  09/17/2012   Procedure: ESOPHAGEAL BANDING;  Surgeon: NRogene Houston MD;  [facility];  Service: Endoscopy;  Laterality: N/A;  . ESOPHAGEAL BANDING N/A 10/22/2013   Procedure: ESOPHAGEAL BANDING;  Surgeon: NRogene Houston MD;  [facility];  Service: Endoscopy;  Laterality: N/A;  . ESOPHAGEAL  BANDING N/A 11/28/2014   Procedure: ESOPHAGEAL BANDING;  Surgeon: Rogene Houston, MD;  [facility];  Service: Endoscopy;  Laterality: N/A;  . ESOPHAGEAL BANDING N/A 10/29/2015   Procedure: ESOPHAGEAL BANDING;  Surgeon: Rogene Houston, MD;  [facility];  Service: Endoscopy;  Laterality: N/A;  . ESOPHAGOGASTRODUODENOSCOPY  12/20/2011   Procedure: ESOPHAGOGASTRODUODENOSCOPY (EGD);  Surgeon: Jamesetta So, MD;  [facility];  Service: Gastroenterology;  Laterality: N/A;  . ESOPHAGOGASTRODUODENOSCOPY N/A 07/04/2012   Procedure: ESOPHAGOGASTRODUODENOSCOPY (EGD);  Surgeon: Rogene Houston, MD;  [facility];  Service: Endoscopy;  Laterality: N/A;  235-moved to 255 Ann to notify pt  . ESOPHAGOGASTRODUODENOSCOPY N/A 09/17/2012   Procedure: ESOPHAGOGASTRODUODENOSCOPY (EGD);  Surgeon: Rogene Houston, MD;  [facility];  Service: Endoscopy;  Laterality: N/A;  730  . ESOPHAGOGASTRODUODENOSCOPY N/A 10/22/2013   Procedure: ESOPHAGOGASTRODUODENOSCOPY (EGD);  Surgeon: Rogene Houston, MD;  [facility];  Service: Endoscopy;  Laterality: N/A;  730  . ESOPHAGOGASTRODUODENOSCOPY N/A 11/28/2014   Procedure: ESOPHAGOGASTRODUODENOSCOPY (EGD);  Surgeon: Rogene Houston, MD;  [facility];  Service: Endoscopy;  Laterality: N/A;  1:25  . ESOPHAGOGASTRODUODENOSCOPY N/A 10/29/2015   Procedure: ESOPHAGOGASTRODUODENOSCOPY (EGD);  Surgeon: Rogene Houston, MD;  [facility];  Service: Endoscopy;  Laterality: N/A;  12:00  . ESOPHAGOGASTRODUODENOSCOPY N/A 12/12/2016   Procedure: ESOPHAGOGASTRODUODENOSCOPY (EGD);  Surgeon: Rogene Houston, MD;  [facility];  Service: Endoscopy;  Laterality: N/A;  225  . FLEXIBLE SIGMOIDOSCOPY N/A 03/20/2017   Procedure: FLEXIBLE SIGMOIDOSCOPY;  Surgeon: Rogene Houston, MD;  [facility];  Service: Endoscopy;  Laterality: N/A;  7:30  . JOINT REPLACEMENT    . LEFT HEART CATH AND CORS/GRAFTS ANGIOGRAPHY N/A 08/15/2016   Procedure: Left Heart Cath and Cors/Grafts Angiography;  Surgeon: Jettie Booze, MD;  Location: [place] [facility];  Service: Cardiovascular;  Laterality: N/A;  . LEFT HEART CATHETERIZATION WITH CORONARY/GRAFT ANGIOGRAM N/A 12/25/2013   Procedure: LEFT HEART CATHETERIZATION WITH Beatrix Fetters;  Surgeon: Blane Ohara, MD;  Location: [facility] CATH LAB;  Service: Cardiovascular;  Laterality: N/A;  . rotator cuff left  2007  . TONSILLECTOMY      Family History: Family History  Problem Relation Age of Onset  . Heart attack Mother   . Heart attack Father 97       cause of death  . Coronary artery disease Sister        CABG   . Colon cancer Neg Hx     Social History: Social History   Socioeconomic History  . Marital status: Divorced    Spouse name: Not on file  . Number of children: Not on file  . Years of education: Not on file  . Highest education level: Not on file  Occupational History  . Occupation: Disabled  Social Needs  . Financial resource strain: Not on file  . Food insecurity:    Worry: Not on file    Inability: Not on file  . Transportation needs:    Medical: Not on file    Non-medical: Not on file  Tobacco Use  . Smoking status: Former Smoker    Packs/day: 0.50    Years: 20.00    Pack years: 10.00    Types: Cigarettes    Last attempt to quit: 07/21/1995    Years since quitting: 21.8  . Smokeless tobacco: Never Used  Substance and Sexual Activity  . Alcohol use:  No  . Drug use: No  . Sexual activity: Not on file  Lifestyle  . Physical activity:    Days per week: Not on file    Minutes per  session: Not on file  . Stress: Not on file  Relationships  . Social connections:    Talks on phone: Not on file    Gets together: Not on file    Attends religious service: Not on file    Active member of club or organization: Not on file    Attends meetings of clubs or organizations: Not on file    Relationship status: Not on file  Other Topics Concern  . Not on file  Social History Narrative   Lives in [place] by herself.      Allergies:  Allergies  Allergen Reactions  . Acetaminophen Other (See Comments)    Liver problems  . Oxycodone Itching and Nausea Only  . Ace Inhibitors Other (See Comments)    UNSURE OF REACTION TYPE  . Cefaclor Itching and Rash  . Cephalexin Itching and Rash  . Penicillins Rash    Has patient had a PCN reaction causing immediate rash, facial/tongue/throat swelling, SOB or lightheadedness with hypotension: YES Has patient had a PCN reaction causing severe rash involving mucus membranes or skin necrosis: NO Has patient had a PCN reaction that required hospitalization: YES Has patient had a PCN reaction occurring within the last 10 years: NO If all of the above answers are "NO", then may proceed with Cephalosporin use.   . Pregabalin Other (See Comments)    Retains fluid  . Tape Itching and Rash    Takes skin right off with medical tape--PAPER TAPE ONLY Other reaction(s): Itching Takes skin right off with medical tape--PAPER TAPE ONLY Takes skin right off with medical tape--PAPER TAPE ONLY    Objective:    Vital Signs:   Temp:  [98.1 F (36.7 C)-98.8 F (37.1 C)] 98.4 F (36.9 C) (03/28 0756) Pulse Rate:  [56-100] 67 (03/28 0948) Resp:  [16-22] 18 (03/28 0756) BP: (93-138)/(42-64) 123/55 (03/28 0948) SpO2:  [94 %-100 %] 97 % (03/28 0756) Weight:  [217 lb 3.2 oz (98.5 kg)-221 lb (100.2 kg)] 217 lb 3.2 oz (98.5 kg) (03/28 0357) Last BM Date: 05/24/17  Weight change: Filed Weights   05/24/17 1038 05/24/17 2137 05/25/17 0357  Weight: 221 lb  (100.2 kg) 217 lb 3.2 oz (98.5 kg) 217 lb 3.2 oz (98.5 kg)    Intake/Output:   Intake/Output Summary (Last 24 hours) at 05/25/2017 1011 Last data filed at 05/25/2017 0951 Gross per 24 hour  Intake 560 ml  Output 100 ml  Net 460 ml    Physical Exam    General:  Obese. Lying in ed. No resp difficulty HEENT: normal anicteric  Neck: supple. JVP to jaw. Carotids 2+ bilat; no bruits. No lymphadenopathy or thyromegaly appreciated. Cor: PMI nondisplaced. Regular rate & rhythm. No rubs, gallops or murmurs. Lungs: diminished throughout. No wheezing.  Abdomen: markedly obese soft, nontender, +distended. No hepatosplenomegaly. No bruits or masses. Good bowel sounds. Extremities: no cyanosis, clubbing, rash, 1+ edema.  Neuro: alert & orientedx3, cranial nerves grossly intact. moves all 4 extremities w/o difficulty. Affect pleasant  Telemetry   NSR 60-70s with 1st degree AV block and IVCD. Personally reviewed.   EKG    3/17: afib with IVCD, 87 bpm  Labs   Basic Metabolic Panel: Recent Labs  Lab 05/24/17 1050 05/24/17 1727 05/25/17 0537  NA 140  --  140  K 4.0  --  3.0*  CL 103  --  100*  CO2 26  --  27  GLUCOSE 186*  --  109*  BUN 9  --  13  CREATININE 0.90  --  0.99  CALCIUM 9.0  --  8.5*  MG  --  1.7  --     Liver Function Tests: No results for input(s): AST, ALT, ALKPHOS, BILITOT, PROT, ALBUMIN in the last 168 hours. No results for input(s): LIPASE, AMYLASE in the last 168 hours. No results for input(s): AMMONIA in the last 168 hours.  CBC: Recent Labs  Lab 05/24/17 1050  WBC 2.9*  HGB 8.7*  HCT 30.7*  MCV 81.4  PLT 64*    Cardiac Enzymes: No results for input(s): CKTOTAL, CKMB, CKMBINDEX, TROPONINI in the last 168 hours.  BNP: BNP (last 3 results) Recent Labs    08/14/16 1414 05/24/17 1050  BNP 104.0* 186.7*    ProBNP (last 3 results) No results for input(s): PROBNP in the last 8760 hours.   CBG: No results for input(s): GLUCAP in the last 168  hours.  Coagulation Studies: Recent Labs    05/24/17 1050  LABPROT 16.0*  INR 1.29     Imaging   Dg Chest 2 View  Result Date: 05/24/2017 CLINICAL DATA:  Shortness of breath, cough, and chest congestion. 30 pound weight loss over the past month. History of CABG, CHF, former smoker. EXAM: CHEST - 2 VIEW COMPARISON:  Chest x-ray dated February 21, 2017 FINDINGS: The lungs are well-expanded. The interstitial markings are coarse. There are areas of scarring or subsegmental atelectasis bilaterally. The cardiac silhouette is enlarged. The pulmonary vascularity is engorged. There is no pleural effusion. There is calcification in the wall of the aortic arch and descending thoracic aorta. There are post CABG changes. The bony thorax exhibits no acute abnormality. IMPRESSION: COPD with superimposed CHF with mild interstitial edema. No alveolar pneumonia. Thoracic aortic atherosclerosis. Electronically Signed   By: David  Martinique M.D.   On: 05/24/2017 11:06     Medications:     Current Medications: . aspirin EC  81 mg Oral Daily  . atorvastatin  20 mg Oral QPM  . enoxaparin (LOVENOX) injection  40 mg Subcutaneous Q24H  . ezetimibe  10 mg Oral QHS  . FLUoxetine  40 mg Oral Daily  . furosemide  80 mg Intravenous BID  . irbesartan  150 mg Oral Daily  . isosorbide mononitrate  60 mg Oral Daily  . loratadine  10 mg Oral Daily  . metoprolol succinate  50 mg Oral Daily  . pantoprazole  40 mg Oral Daily  . polyethylene glycol  17 g Oral Daily  . potassium chloride  40 mEq Oral BID  . spironolactone  12.5 mg Oral Daily    Infusions: . magnesium sulfate 1 - 4 g bolus IVPB        Patient Profile   Samantha [name] is a 67 y.o. female with a history of CAD s/p CABG in 2011 and multiple PCI afterwards, chronic systolic heart failure, persistent atrial fibrillation diagnosed 07/2016 (she is not on anticoagulation or Plavix due to gastric varices), hypertension, non-ETOH cirrhosis, DM, obesity,  esophageal varices requiring banding, gastric varices, and hyperlipidemia.  Admitted for evaluation and treatment of A/C systolic HF  Assessment/Plan   1. A/C combined HF. ICM. Echo 03/2008: EF 30-35%, Echo 07/2010: EF 50%, LHC 11/16: EF 35-40%, LHC 6/18: EF 25-35% coronaries unchanged, Echo 12/2016: EF 30-35% RV moderately reduced, septal-lateral  dyssynchrony, severe hypokinesis of the inferior, inferolateral, and anterolateral walls. Not referred for ICD due to advanced liver disease - Volume status elevated. NYHA class IIIb - Continue 80 mg IV lasix BID.    - Continue Toprol XL 50 mg daily  - Continue irbesartan 150 mg daily > Takes valsartan at home. Allergic to Entresto (facial swelling) - Increase spiro to 25 mg daily - Start digoxin 0.125 mg daily - Repeat echo  2. CAD s/p CABG 2011, multiple PCI, most recent LHC 6/18 with no significant changes and no intervention.  - Continue ASA, statin, zetia. Will check lipid panel tomorrow am.  - Continue imdur 60 mg daily - No CP currently, but has been having exertional CP. POC troponin was 0.0. Trend troponins  3. Paroxysmal atrial fibrillation. Diagnosed 07/2016 during hospitalization. Not a candidate for [facility] with esophageal varices, cirrhosis, and low platelets   - Rate controlled on Toprol XL 50 mg.  - Appears to be in NSR on telemetry. Will get EKG to confirm.   4. Cirrhosis secondary to NASH. She has had esophageal varices treated with banding, but she now has gastric varices. - Follows with Dr. Laural Golden.  - Reports no blood in urine or stool - Daily CBC. Will check hepatic function panel.   5. Hypokalemia  - K 3.0, Mag 1.7. Will supplement.  - Daily BMET    Medication concerns reviewed with patient and pharmacy team. Barriers identified: none  Length of Stay: Dasher, NP 05/25/17, 10:11 AM  Advanced Heart Failure Team Pager [phone] (M-F; [place])  Please contact [facility] for night-coverage after hours (4p  -7a ) and weekends on [email or web address]  67 y/o woman with h/o biventricular HF due to ICM previous EF 25-35%, PAF, NASH-related cirrhosis admitted with recurrent HF.   Echo reviewed personally and EF 40-45% with mild RV HK and septal flattening.   Remains volume overload. Initially had good response to IV lasix but has now tapered off. Will add metolazone. May need to consider RHC. Will switch t torsemide when ready for d/c. In NSR today. Not on AC due to h/o variceal bleeding. Supp electrolytes. Can stop digoxin.   Glori Bickers, MD  6:13 PM

## 2017-05-25 NOTE — Progress Notes (Signed)
Pt complains of chronic back pain with pain scale 7/10, pt stated " I'm taking Hydrocodone at home for my back pain if I needed it". MD was notified and ordered Hydrocodone prn every 6 hours for the pain. Will continue to monitor pt.   05/25/17 2218  Pain Assessment  Pain Scale 0-10  Pain Score 7  Pain Type Chronic pain  Pain Location Back  Pain Orientation Mid;Lower  Pain Descriptors / Indicators Aching  Pain Frequency Constant  Pain Onset On-going  Patients Stated Pain Goal 2  Pain Intervention(s) RN made aware  Multiple Pain Sites No

## 2017-05-25 NOTE — Progress Notes (Signed)
  Echocardiogram 2D Echocardiogram has been performed.  Samella Lucchetti T Thaddeus Evitts 05/25/2017, 12:35 PM

## 2017-05-25 NOTE — Progress Notes (Signed)
RN attempted to assist pt to ambulate in hall. Pt refused, states wants to try again later.

## 2017-05-25 NOTE — Progress Notes (Signed)
Offered Pt assistance with bath. Pt stated that she will do her own bath, all she needed is wash cloths, towels, and gown. Tech provided all bath supplies for Pt.

## 2017-05-25 NOTE — Plan of Care (Signed)
  Problem: Education: Goal: Knowledge of General Education information will improve Outcome: Progressing   Problem: Health Behavior/Discharge Planning: Goal: Ability to manage health-related needs will improve Outcome: Progressing   Problem: Clinical Measurements: Goal: Respiratory complications will improve Outcome: Progressing   Problem: Clinical Measurements: Goal: Cardiovascular complication will be avoided Outcome: Progressing   Problem: Cardiac: Goal: Ability to achieve and maintain adequate cardiopulmonary perfusion will improve Outcome: Progressing   Problem: Activity: Goal: Capacity to carry out activities will improve Outcome: Progressing

## 2017-05-25 NOTE — Progress Notes (Signed)
Patient says she takes hydrocodone for cough. She states she normally takes it at night and very rarely does she take it twice a day. RN questioned patient about oxycodone allergy- she states she thinks it made her itch " but all pain medicine makes me itch even the hydrocodone. I just take benadryl sometimes with it."

## 2017-05-26 LAB — FOLATE: Folate: 23.5 ng/mL (ref >5.9)

## 2017-05-26 LAB — IRON AND TIBC
Iron: 57 ug/dL (ref 28–170)
Saturation Ratios: 13 % (ref 10.4–31.8)
TIBC: 437 ug/dL (ref 250–450)
UIBC: 380 ug/dL

## 2017-05-26 LAB — BASIC METABOLIC PANEL
Anion gap: 13 (ref 5–15)
BUN: 16 mg/dL (ref 6–20)
CO2: 25 mmol/L (ref 22–32)
Calcium: 8.9 mg/dL (ref 8.9–10.3)
Chloride: 100 mmol/L — ABNORMAL LOW (ref 101–111)
Creatinine, Ser: 1.2 mg/dL — ABNORMAL HIGH (ref 0.44–1.00)
GFR calc Af Amer: 53 mL/min — ABNORMAL LOW (ref >60)
GFR calc non Af Amer: 45 mL/min — ABNORMAL LOW (ref >60)
Glucose, Bld: 108 mg/dL — ABNORMAL HIGH (ref 65–99)
Potassium: 3.4 mmol/L — ABNORMAL LOW (ref 3.5–5.1)
Sodium: 138 mmol/L (ref 135–145)

## 2017-05-26 LAB — CBC
HCT: 28.4 % — ABNORMAL LOW (ref 36.0–46.0)
Hemoglobin: 8.4 g/dL — ABNORMAL LOW (ref 12.0–15.0)
MCH: 24.2 pg — ABNORMAL LOW (ref 26.0–34.0)
MCHC: 29.6 g/dL — ABNORMAL LOW (ref 30.0–36.0)
MCV: 81.8 fL (ref 78.0–100.0)
Platelets: 65 10*3/uL — ABNORMAL LOW (ref 150–400)
RBC: 3.47 MIL/uL — ABNORMAL LOW (ref 3.87–5.11)
RDW: 17.8 % — ABNORMAL HIGH (ref 11.5–15.5)
WBC: 3 10*3/uL — ABNORMAL LOW (ref 4.0–10.5)

## 2017-05-26 LAB — VITAMIN B12: Vitamin B-12: 522 pg/mL (ref 180–914)

## 2017-05-26 LAB — RETICULOCYTES
RBC.: 3.47 MIL/uL — ABNORMAL LOW (ref 3.87–5.11)
Retic Count, Absolute: 69.4 10*3/uL (ref 19.0–186.0)
Retic Ct Pct: 2 % (ref 0.4–3.1)

## 2017-05-26 LAB — LIPID PANEL
Cholesterol: 111 mg/dL (ref 0–200)
HDL: 38 mg/dL — ABNORMAL LOW (ref >40)
LDL Cholesterol: 54 mg/dL (ref 0–99)
Total CHOL/HDL Ratio: 2.9 RATIO
Triglycerides: 93 mg/dL (ref <150)
VLDL: 19 mg/dL (ref 0–40)

## 2017-05-26 LAB — FERRITIN: Ferritin: 9 ng/mL — ABNORMAL LOW (ref 11–307)

## 2017-05-26 LAB — MAGNESIUM: Magnesium: 2.5 mg/dL — ABNORMAL HIGH (ref 1.7–2.4)

## 2017-05-26 MED ORDER — POTASSIUM CHLORIDE CRYS ER 20 MEQ PO TBCR
40.0000 meq | EXTENDED_RELEASE_TABLET | Freq: Two times a day (BID) | ORAL | Status: AC
  Administered 2017-05-26 (×2): 40 meq via ORAL
  Filled 2017-05-26 (×2): qty 2

## 2017-05-26 MED ORDER — METOLAZONE 2.5 MG PO TABS
2.5000 mg | ORAL_TABLET | Freq: Once | ORAL | Status: AC
  Administered 2017-05-26: 2.5 mg via ORAL
  Filled 2017-05-26: qty 1

## 2017-05-26 NOTE — Progress Notes (Addendum)
Advanced Heart Failure Rounding Note  PCP-Cardiologist: Sherren Mocha, MD   Subjective:    -840 mls and down 1 lb with 80 mg IV lasix BID + metolazone 2.5 mg. Creatinine 0.99 > 1.2  Echo 3/28 reviewed by Dr Haroldine Laws: EF 40-45% (read is 50-55%) with mild RV HK and septal flattening.   Episode of BRBPR yesterday. Hemoglobin 9.2 > 8.4. No further bleeding.   No CP. SOB is improved, but still SOB when up and moving around room. Still feels very swollen. No orthopnea or PND.   Objective:   Weight Range: 216 lb 4.8 oz (98.1 kg) Body mass index is 39.56 kg/m.   Vital Signs:   Temp:  [97.9 F (36.6 C)-98.3 F (36.8 C)] 98.1 F (36.7 C) (03/29 0450) Pulse Rate:  [60-67] 61 (03/29 0450) Resp:  [20-21] 20 (03/29 0450) BP: (89-126)/(33-66) 105/55 (03/29 0450) SpO2:  [94 %-95 %] 95 % (03/29 0450) Weight:  [216 lb 4.8 oz (98.1 kg)] 216 lb 4.8 oz (98.1 kg) (03/29 0450) Last BM Date: 05/25/17  Weight change: Filed Weights   05/24/17 2137 05/25/17 0357 05/26/17 0450  Weight: 217 lb 3.2 oz (98.5 kg) 217 lb 3.2 oz (98.5 kg) 216 lb 4.8 oz (98.1 kg)    Intake/Output:   Intake/Output Summary (Last 24 hours) at 05/26/2017 0806 Last data filed at 05/26/2017 0454 Gross per 24 hour  Intake 1260 ml  Output 2100 ml  Net -840 ml      Physical Exam    General:  Obese. No resp difficulty. Lying in bed.  HEENT: Normal,anicteric Neck: Supple. JVP 10-12. Carotids 2+ bilat; no bruits. No lymphadenopathy or thyromegaly appreciated. Cor: PMI nondisplaced. Regular rate & rhythm. No rubs, gallops or murmurs. Lungs: diminished RLL. On RA Abdomen: Obese Soft, nontender, +distended. No hepatosplenomegaly. No bruits or masses. Good bowel sounds. Extremities: No cyanosis, clubbing, rash, BLE 1+ edema Neuro: Alert & orientedx3, cranial nerves grossly intact. moves all 4 extremities w/o difficulty. Affect pleasant   Telemetry   SR 60s with 1st degree AV block and IVCD. 5 beats NSVT yesterday  afternoon. Personally reviewed.   EKG    NSR 66 with PACs and IVCD. Personally reviewed.   Labs    CBC Recent Labs    05/24/17 1050 05/25/17 1019  WBC 2.9* 2.9*  HGB 8.7* 9.2*  HCT 30.7* 31.4*  MCV 81.4 81.6  PLT 64* 61*   Basic Metabolic Panel Recent Labs    05/24/17 1727 05/25/17 0537 05/26/17 0637  NA  --  140 138  K  --  3.0* 3.4*  CL  --  100* 100*  CO2  --  27 25  GLUCOSE  --  109* 108*  BUN  --  13 16  CREATININE  --  0.99 1.20*  CALCIUM  --  8.5* 8.9  MG 1.7  --  2.5*   Liver Function Tests Recent Labs    05/25/17 1030  AST 41  ALT 14  ALKPHOS 58  BILITOT 1.0  PROT 7.3  ALBUMIN 3.8   No results for input(s): LIPASE, AMYLASE in the last 72 hours. Cardiac Enzymes Recent Labs    05/25/17 1019 05/25/17 1540 05/25/17 2226  TROPONINI <0.03 <0.03 <0.03    BNP: BNP (last 3 results) Recent Labs    08/14/16 1414 05/24/17 1050  BNP 104.0* 186.7*    ProBNP (last 3 results) No results for input(s): PROBNP in the last 8760 hours.   D-Dimer No results for input(s): DDIMER in  the last 72 hours. Hemoglobin A1C No results for input(s): HGBA1C in the last 72 hours. Fasting Lipid Panel Recent Labs    05/26/17 0637  CHOL 111  HDL 38*  LDLCALC 54  TRIG 93  CHOLHDL 2.9   Thyroid Function Tests Recent Labs    05/25/17 1019  TSH 0.704    Other results:   Imaging     No results found.   Medications:     Scheduled Medications: . aspirin EC  81 mg Oral Daily  . atorvastatin  20 mg Oral QPM  . enoxaparin (LOVENOX) injection  40 mg Subcutaneous Q24H  . ezetimibe  10 mg Oral QHS  . FLUoxetine  40 mg Oral Daily  . furosemide  80 mg Intravenous BID  . irbesartan  150 mg Oral Daily  . isosorbide mononitrate  60 mg Oral Daily  . loratadine  10 mg Oral Daily  . metoprolol succinate  50 mg Oral Daily  . pantoprazole  40 mg Oral Daily  . polyethylene glycol  17 g Oral Daily  . spironolactone  25 mg Oral Daily      Infusions:   PRN Medications:  albuterol, ALPRAZolam, calcium carbonate, fluticasone, guaiFENesin-dextromethorphan, HYDROcodone-acetaminophen, nitroGLYCERIN, ondansetron (ZOFRAN) IV, sodium chloride flush, zolpidem    Patient Profile   Samantha [name] is a 67 y.o. female with a history of CAD s/p CABG in 2011 and multiple PCI afterwards, chronic systolic heart failure, persistent atrial fibrillation diagnosed 07/2016 (she is not on anticoagulation or Plavix due to gastric varices), hypertension, non-ETOH cirrhosis, DM, obesity, esophageal varices requiring banding, gastric varices, and hyperlipidemia.  Admitted for evaluation and treatment of A/C systolic HF   Assessment/Plan    Medicatio1. A/C combined HF. ICM. Echo 03/2008: EF 30-35%, Echo 07/2010: EF 50%, LHC 11/16: EF 35-40%, LHC 6/18: EF 25-35% coronaries unchanged, Echo 12/2016: EF 30-35% RV moderately reduced, septal-lateral dyssynchrony, severe hypokinesis of the inferior,inferolateral, and anterolateral walls. Not referred for ICD due to advanced liver disease - Volume status remains elevated. NYHA class IIIb - Continue 80 mg IV lasix BID + 2.5 mg metolazone.  Will need torsemide at DC  - DC Toprol. Concern for low output. Consider RHC - Continue irbesartan 150 mg daily > Takes valsartan at home. Allergic to Entresto (facial swelling) - Continue spiro to 25 mg daily - Echo reviewed by Dr Haroldine Laws: EF 40-45% (read is 50-55%) with mild RV HK and septal flattening.   2. CAD s/p CABG 2011, multiple PCI, most recent LHC 6/18 with no significant changes and no intervention.  - Continue ASA, statin, zetia. LDL 54, HDL 38 this am.  - Continue imdur 60 mg daily - No further CP. Troponins negative x3  3. Paroxysmal atrial fibrillation. Diagnosed 07/2016 during hospitalization. Not a candidate for [facility] with esophageal varices, cirrhosis, and low platelets   - Rate controlled on Toprol XL 50 mg.  - Maintaining NSR.   4.  Cirrhosis secondary to NASH. She has had esophageal varices treated with banding, but she now has gastric varices. - Follows with Dr. Laural Golden.  - Had an episode of BRBPR yesterday. She says this happens occasionally. Will consult GI if she has another episode.  - Flex sigmoid 1/19 showed external hemorrhoids, otherwise normal. - Hemoglobin 9.2 > 8.4, but no further bleeding. Daily CBC.  - Will DC lovenox with hx of varices and bleeding yesterday. Add SCDs + Ted hose for VTE prophylaxis. Encouraged ambulation. Discussed with PharmD.  5. NSVT - Mag 2.5, K 3.4.  Will supp. Discussed with PharmD - Daily BMET    Medication concerns reviewed with patient and pharmacy team. Barriers identified: none   Length of Stay: [place], NP  05/26/2017, 8:06 AM  Advanced Heart Failure Team Pager [phone] (M-F; 7a - 4p)  Please contact [facility] for night-coverage after hours (4p -7a ) and weekends on [email or web address]  Patient seen and examined with the above-signed Advanced Practice Provider and/or Housestaff. I personally reviewed laboratory data, imaging studies and relevant notes. I independently examined the patient and formulated the important aspects of the plan. I have edited the note to reflect any of my changes or salient points. I have personally discussed the plan with the patient and/or family.  She remains quite symptomatic. Volume status hard to assess. Seems to remain elevated but diuresis sluggish. Will add metolazone. If poor response will plan RHC on Monday. Hgb dropped slightly so SQ lovenox stopped. SCDs placed. She has brief NSVT. Keep electrolytes supped.   Glori Bickers, MD  3:27 PM

## 2017-05-27 LAB — BASIC METABOLIC PANEL
Anion gap: 11 (ref 5–15)
BUN: 16 mg/dL (ref 6–20)
CO2: 28 mmol/L (ref 22–32)
Calcium: 9 mg/dL (ref 8.9–10.3)
Chloride: 99 mmol/L — ABNORMAL LOW (ref 101–111)
Creatinine, Ser: 1.19 mg/dL — ABNORMAL HIGH (ref 0.44–1.00)
GFR calc Af Amer: 53 mL/min — ABNORMAL LOW (ref >60)
GFR calc non Af Amer: 46 mL/min — ABNORMAL LOW (ref >60)
Glucose, Bld: 100 mg/dL — ABNORMAL HIGH (ref 65–99)
Potassium: 3.7 mmol/L (ref 3.5–5.1)
Sodium: 138 mmol/L (ref 135–145)

## 2017-05-27 LAB — CBC
HCT: 29.5 % — ABNORMAL LOW (ref 36.0–46.0)
Hemoglobin: 8.4 g/dL — ABNORMAL LOW (ref 12.0–15.0)
MCH: 23.3 pg — ABNORMAL LOW (ref 26.0–34.0)
MCHC: 28.5 g/dL — ABNORMAL LOW (ref 30.0–36.0)
MCV: 81.7 fL (ref 78.0–100.0)
Platelets: 71 10*3/uL — ABNORMAL LOW (ref 150–400)
RBC: 3.61 MIL/uL — ABNORMAL LOW (ref 3.87–5.11)
RDW: 17.7 % — ABNORMAL HIGH (ref 11.5–15.5)
WBC: 3.2 10*3/uL — ABNORMAL LOW (ref 4.0–10.5)

## 2017-05-27 MED ORDER — METOLAZONE 2.5 MG PO TABS
2.5000 mg | ORAL_TABLET | Freq: Every day | ORAL | Status: DC
  Administered 2017-05-27 – 2017-05-28 (×2): 2.5 mg via ORAL
  Filled 2017-05-27 (×2): qty 1

## 2017-05-27 MED ORDER — POTASSIUM CHLORIDE CRYS ER 20 MEQ PO TBCR
20.0000 meq | EXTENDED_RELEASE_TABLET | Freq: Two times a day (BID) | ORAL | Status: DC
  Administered 2017-05-27 – 2017-05-28 (×3): 20 meq via ORAL
  Filled 2017-05-27 (×2): qty 1
  Filled 2017-05-27 (×3): qty 2

## 2017-05-27 MED ORDER — POTASSIUM CHLORIDE CRYS ER 20 MEQ PO TBCR
40.0000 meq | EXTENDED_RELEASE_TABLET | Freq: Once | ORAL | Status: AC
  Administered 2017-05-27: 40 meq via ORAL
  Filled 2017-05-27: qty 2

## 2017-05-27 MED ORDER — FUROSEMIDE 10 MG/ML IJ SOLN
80.0000 mg | Freq: Three times a day (TID) | INTRAMUSCULAR | Status: DC
  Administered 2017-05-27 – 2017-05-28 (×3): 80 mg via INTRAVENOUS
  Filled 2017-05-27 (×3): qty 8

## 2017-05-27 NOTE — Progress Notes (Addendum)
Advanced Heart Failure Rounding Note  PCP-Cardiologist: Sherren Mocha, MD   Subjective:    Remains SOB. On IV lasix. Out 2.9L but weight down only 1 pound. (6 pounds total) Denies excess fluid intake   Echo 3/28= EF 40-45% (read is 50-55%) with mild RV HK and septal flattening.   No bleeding   Objective:   Weight Range: 97.6 kg (215 lb 1.6 oz) Body mass index is 39.34 kg/m.   Vital Signs:   Temp:  [98.1 F (36.7 C)-99.5 F (37.5 C)] 98.1 F (36.7 C) (03/30 0515) Pulse Rate:  [62-69] 64 (03/30 0836) Resp:  [18-20] 18 (03/30 0515) BP: (96-104)/(32-50) 101/50 (03/30 0836) SpO2:  [92 %-93 %] 93 % (03/30 0515) Weight:  [97.6 kg (215 lb 1.6 oz)] 97.6 kg (215 lb 1.6 oz) (03/30 0515) Last BM Date: 05/26/17  Weight change: Filed Weights   05/25/17 0357 05/26/17 0450 05/27/17 0515  Weight: 98.5 kg (217 lb 3.2 oz) 98.1 kg (216 lb 4.8 oz) 97.6 kg (215 lb 1.6 oz)    Intake/Output:   Intake/Output Summary (Last 24 hours) at 05/27/2017 1340 Last data filed at 05/27/2017 1100 Gross per 24 hour  Intake 480 ml  Output 2450 ml  Net -1970 ml      Physical Exam    General:  Obese woman lying in bed  No resp difficulty HEENT: normal Neck: supple. JVP to ear Carotids 2+ bilat; no bruits. No lymphadenopathy or thryomegaly appreciated. Cor: PMI nondisplaced. Regular rate & rhythm. No rubs, gallops or murmurs. Lungs: clear Abdomen: obese soft, nontender, nondistended. No hepatosplenomegaly. No bruits or masses. Good bowel sounds. Extremities: no cyanosis, clubbing, rash, 1-2+ edema Neuro: alert & orientedx3, cranial nerves grossly intact. moves all 4 extremities w/o difficulty. Affect pleasant   Telemetry   SR 60-70 Personally reviewed   EKG    NSR 66 with PACs and IVCD. Personally reviewed.   Labs    CBC Recent Labs    05/26/17 0637 05/27/17 0220  WBC 3.0* 3.2*  HGB 8.4* 8.4*  HCT 28.4* 29.5*  MCV 81.8 81.7  PLT 65* 71*   Basic Metabolic Panel Recent Labs     05/24/17 1727  05/26/17 0637 05/27/17 0220  NA  --    < > 138 138  K  --    < > 3.4* 3.7  CL  --    < > 100* 99*  CO2  --    < > 25 28  GLUCOSE  --    < > 108* 100*  BUN  --    < > 16 16  CREATININE  --    < > 1.20* 1.19*  CALCIUM  --    < > 8.9 9.0  MG 1.7  --  2.5*  --    < > = values in this interval not displayed.   Liver Function Tests Recent Labs    05/25/17 1030  AST 41  ALT 14  ALKPHOS 58  BILITOT 1.0  PROT 7.3  ALBUMIN 3.8   No results for input(s): LIPASE, AMYLASE in the last 72 hours. Cardiac Enzymes Recent Labs    05/25/17 1019 05/25/17 1540 05/25/17 2226  TROPONINI <0.03 <0.03 <0.03    BNP: BNP (last 3 results) Recent Labs    08/14/16 1414 05/24/17 1050  BNP 104.0* 186.7*    ProBNP (last 3 results) No results for input(s): PROBNP in the last 8760 hours.   D-Dimer No results for input(s): DDIMER in the last 72  hours. Hemoglobin A1C No results for input(s): HGBA1C in the last 72 hours. Fasting Lipid Panel Recent Labs    05/26/17 0637  CHOL 111  HDL 38*  LDLCALC 54  TRIG 93  CHOLHDL 2.9   Thyroid Function Tests Recent Labs    05/25/17 1019  TSH 0.704    Other results:   Imaging    No results found.   Medications:     Scheduled Medications: . aspirin EC  81 mg Oral Daily  . atorvastatin  20 mg Oral QPM  . ezetimibe  10 mg Oral QHS  . FLUoxetine  40 mg Oral Daily  . furosemide  80 mg Intravenous BID  . irbesartan  150 mg Oral Daily  . isosorbide mononitrate  60 mg Oral Daily  . loratadine  10 mg Oral Daily  . pantoprazole  40 mg Oral Daily  . polyethylene glycol  17 g Oral Daily  . spironolactone  25 mg Oral Daily    Infusions:   PRN Medications: albuterol, ALPRAZolam, calcium carbonate, fluticasone, guaiFENesin-dextromethorphan, HYDROcodone-acetaminophen, nitroGLYCERIN, ondansetron (ZOFRAN) IV, sodium chloride flush, zolpidem    Patient Profile   Samantha [name] is a 67 y.o. female with a history of  CAD s/p CABG in 2011 and multiple PCI afterwards, chronic systolic heart failure, persistent atrial fibrillation diagnosed 07/2016 (she is not on anticoagulation or Plavix due to gastric varices), hypertension, non-ETOH cirrhosis, DM, obesity, esophageal varices requiring banding, gastric varices, and hyperlipidemia.  Admitted for evaluation and treatment of A/C systolic HF   Assessment/Plan   1. A/C combined HF. ICM. Echo 03/2008: EF 30-35%, Echo 07/2010: EF 50%, LHC 11/16: EF 35-40%, LHC 6/18: EF 25-35% coronaries unchanged, Echo 12/2016: EF 30-35% RV moderately reduced, septal-lateral dyssynchrony, severe hypokinesis of the inferior,inferolateral, and anterolateral walls. Not referred for ICD due to advanced liver disease. - Echo 3/19 EF 40-45% (read is 50-55%) with mild RV HK and septal flattening.  - She has R > L HF symptoms with evidence of PAH on echo with RV strain - Volume status remains elevated. NYHA class IIIb - Increase lasix to 80 IV tid. Continue metolazone.  Will need torsemide at DC  - Will likely need RHC on Monday to further evaluate for PAH.  - Continue irbesartan 150 mg daily > Takes valsartan at home. Allergic to Entresto (facial swelling) - Continue spiro to 25 mg daily   2. CAD s/p CABG 2011, multiple PCI, most recent LHC 6/18 with no significant changes and no intervention.  - Continue ASA, statin, zetia. LDL 54, HDL 38 this am.  - Continue imdur 60 mg daily - No further CP. Troponins negative x3  3. Paroxysmal atrial fibrillation. Diagnosed 07/2016 during hospitalization. Not a candidate for [facility] with esophageal varices, cirrhosis, and low platelets   - Maintaining NSR. Off b-blocker with HF. No AC with cirrhosis  4. Cirrhosis secondary to NASH. She has had esophageal varices treated with banding, but she now has gastric varices. - Follows with Dr. Laural Golden.  - Had an episode of BRBPR yesterday. She says this happens occasionally. Will consult GI if she has another  episode.  - Flex sigmoid 1/19 showed external hemorrhoids, otherwise normal. - Hemoglobin stable at8.4, but no further bleeding. Daily CBC.  - Off ovenox with hx of varices and bleeding yesterday. Add SCDs + Ted hose for VTE prophylaxis. Encouraged ambulation. Discussed with PharmD.  5. NSVT - Quiescent Mag 2.5, K 3.7. Will supp.  - Daily BMET    Medication concerns reviewed  with patient and pharmacy team. Barriers identified: none   Length of Stay: 3  Glori Bickers, MD  05/27/2017, 1:40 PM  Advanced Heart Failure Team Pager [phone] (M-F; [place])  Please contact [facility] for night-coverage after hours (4p -7a ) and weekends on [email or web address]

## 2017-05-27 NOTE — Plan of Care (Signed)
  Problem: Nutrition: Goal: Adequate nutrition will be maintained Outcome: Progressing   Problem: Elimination: Goal: Will not experience complications related to bowel motility Outcome: Progressing   Problem: Education: Goal: Ability to demonstrate management of disease process will improve Outcome: Progressing   Problem: Activity: Goal: Capacity to carry out activities will improve Outcome: Progressing   Problem: Cardiac: Goal: Ability to achieve and maintain adequate cardiopulmonary perfusion will improve Outcome: Progressing

## 2017-05-28 DIAGNOSIS — I5033 Acute on chronic diastolic (congestive) heart failure: Secondary | ICD-10-CM

## 2017-05-28 LAB — CBC
HCT: 29.2 % — ABNORMAL LOW (ref 36.0–46.0)
HCT: 30.1 % — ABNORMAL LOW (ref 36.0–46.0)
Hemoglobin: 8.7 g/dL — ABNORMAL LOW (ref 12.0–15.0)
Hemoglobin: 8.6 g/dL — ABNORMAL LOW (ref 12.0–15.0)
MCH: 24.3 pg — ABNORMAL LOW (ref 26.0–34.0)
MCH: 23.2 pg — ABNORMAL LOW (ref 26.0–34.0)
MCHC: 29.8 g/dL — ABNORMAL LOW (ref 30.0–36.0)
MCHC: 28.6 g/dL — ABNORMAL LOW (ref 30.0–36.0)
MCV: 81.6 fL (ref 78.0–100.0)
MCV: 81.4 fL (ref 78.0–100.0)
Platelets: 81 10*3/uL — ABNORMAL LOW (ref 150–400)
Platelets: 75 10*3/uL — ABNORMAL LOW (ref 150–400)
RBC: 3.58 MIL/uL — ABNORMAL LOW (ref 3.87–5.11)
RBC: 3.7 MIL/uL — ABNORMAL LOW (ref 3.87–5.11)
RDW: 17.8 % — ABNORMAL HIGH (ref 11.5–15.5)
RDW: 17.8 % — ABNORMAL HIGH (ref 11.5–15.5)
WBC: 3.2 10*3/uL — ABNORMAL LOW (ref 4.0–10.5)
WBC: 3.2 10*3/uL — ABNORMAL LOW (ref 4.0–10.5)

## 2017-05-28 LAB — BASIC METABOLIC PANEL
Anion gap: 10 (ref 5–15)
BUN: 18 mg/dL (ref 6–20)
CO2: 29 mmol/L (ref 22–32)
Calcium: 8.8 mg/dL — ABNORMAL LOW (ref 8.9–10.3)
Chloride: 98 mmol/L — ABNORMAL LOW (ref 101–111)
Creatinine, Ser: 1.28 mg/dL — ABNORMAL HIGH (ref 0.44–1.00)
GFR calc Af Amer: 49 mL/min — ABNORMAL LOW (ref >60)
GFR calc non Af Amer: 42 mL/min — ABNORMAL LOW (ref >60)
Glucose, Bld: 92 mg/dL (ref 65–99)
Potassium: 4.5 mmol/L (ref 3.5–5.1)
Sodium: 137 mmol/L (ref 135–145)

## 2017-05-28 MED ORDER — SODIUM CHLORIDE 0.9% FLUSH
3.0000 mL | Freq: Two times a day (BID) | INTRAVENOUS | Status: DC
  Administered 2017-05-28 – 2017-05-29 (×2): 3 mL via INTRAVENOUS

## 2017-05-28 MED ORDER — SODIUM CHLORIDE 0.9 % IV SOLN: 250.0000 mL | INTRAVENOUS | Status: DC | PRN

## 2017-05-28 MED ORDER — SODIUM CHLORIDE 0.9 % IV SOLN
INTRAVENOUS | Status: DC
  Administered 2017-05-29: 06:00:00 via INTRAVENOUS

## 2017-05-28 MED ORDER — SODIUM CHLORIDE 0.9% FLUSH
3.0000 mL | INTRAVENOUS | Status: DC | PRN
Start: 2017-05-28 — End: 2017-05-29

## 2017-05-28 NOTE — Progress Notes (Signed)
Advanced Heart Failure Rounding Note  PCP-Cardiologist: Sherren Mocha, MD   Subjective:    Lasix increased to tid yesterday, Weight down another 2 pounds. (8 pounds total).   Now diuresing very well. Feels better. Less bloated. No orthopnea or PND  Echo 3/28= EF 40-45% (read is 50-55%) with mild RV HK and septal flattening.    Objective:   Weight Range: 97 kg (213 lb 14.4 oz) Body mass index is 39.12 kg/m.   Vital Signs:   Temp:  [98.1 F (36.7 C)-99.5 F (37.5 C)] 98.2 F (36.8 C) (03/31 0636) Pulse Rate:  [60-63] 61 (03/31 0948) Resp:  [18-19] 18 (03/31 0636) BP: (98-113)/(42-75) 113/75 (03/31 0948) SpO2:  [93 %-98 %] 93 % (03/31 0636) Weight:  [97 kg (213 lb 14.4 oz)] 97 kg (213 lb 14.4 oz) (03/31 0640) Last BM Date: 05/26/17  Weight change: Filed Weights   05/26/17 0450 05/27/17 0515 05/28/17 0640  Weight: 98.1 kg (216 lb 4.8 oz) 97.6 kg (215 lb 1.6 oz) 97 kg (213 lb 14.4 oz)    Intake/Output:   Intake/Output Summary (Last 24 hours) at 05/28/2017 1147 Last data filed at 05/28/2017 1018 Gross per 24 hour  Intake 240 ml  Output 1250 ml  Net -1010 ml      Physical Exam    General:  Obese woman. Lying in bed  No resp difficulty HEENT: normal Neck: supple. JVP 8-9. Carotids 2+ bilat; no bruits. No lymphadenopathy or thryomegaly appreciated. Cor: PMI nondisplaced. Regular rate & rhythm. No rubs, gallops or murmurs. Lungs: clear Abdomen: obese soft, nontender, nondistended. No hepatosplenomegaly. No bruits or masses. Good bowel sounds. Extremities: no cyanosis, clubbing, rash, trace edema Neuro: alert & orientedx3, cranial nerves grossly intact. moves all 4 extremities w/o difficulty. Affect pleasant   Telemetry   SR 60s Personally reviewed   EKG    NSR 66 with PACs and IVCD. Personally reviewed.   Labs    CBC Recent Labs    05/27/17 0220 05/28/17 0547  WBC 3.2* 3.2*  HGB 8.4* 8.7*  HCT 29.5* 29.2*  MCV 81.7 81.6  PLT 71* 81*   Basic  Metabolic Panel Recent Labs    05/26/17 0637 05/27/17 0220 05/28/17 0547  NA 138 138 137  K 3.4* 3.7 4.5  CL 100* 99* 98*  CO2 25 28 29   GLUCOSE 108* 100* 92  BUN 16 16 18   CREATININE 1.20* 1.19* 1.28*  CALCIUM 8.9 9.0 8.8*  MG 2.5*  --   --    Liver Function Tests No results for input(s): AST, ALT, ALKPHOS, BILITOT, PROT, ALBUMIN in the last 72 hours. No results for input(s): LIPASE, AMYLASE in the last 72 hours. Cardiac Enzymes Recent Labs    05/25/17 1540 05/25/17 2226  TROPONINI <0.03 <0.03    BNP: BNP (last 3 results) Recent Labs    08/14/16 1414 05/24/17 1050  BNP 104.0* 186.7*    ProBNP (last 3 results) No results for input(s): PROBNP in the last 8760 hours.   D-Dimer No results for input(s): DDIMER in the last 72 hours. Hemoglobin A1C No results for input(s): HGBA1C in the last 72 hours. Fasting Lipid Panel Recent Labs    05/26/17 0637  CHOL 111  HDL 38*  LDLCALC 54  TRIG 93  CHOLHDL 2.9   Thyroid Function Tests No results for input(s): TSH, T4TOTAL, T3FREE, THYROIDAB in the last 72 hours.  Invalid input(s): FREET3  Other results:   Imaging    No results found.  Medications:     Scheduled Medications: . aspirin EC  81 mg Oral Daily  . atorvastatin  20 mg Oral QPM  . ezetimibe  10 mg Oral QHS  . FLUoxetine  40 mg Oral Daily  . furosemide  80 mg Intravenous TID PC  . irbesartan  150 mg Oral Daily  . isosorbide mononitrate  60 mg Oral Daily  . loratadine  10 mg Oral Daily  . metolazone  2.5 mg Oral Daily  . pantoprazole  40 mg Oral Daily  . polyethylene glycol  17 g Oral Daily  . potassium chloride SA  20 mEq Oral BID  . spironolactone  25 mg Oral Daily    Infusions:   PRN Medications: albuterol, ALPRAZolam, calcium carbonate, fluticasone, guaiFENesin-dextromethorphan, HYDROcodone-acetaminophen, nitroGLYCERIN, ondansetron (ZOFRAN) IV, sodium chloride flush, zolpidem    Patient Profile   Samantha [name] is a 67 y.o.  female with a history of CAD s/p CABG in 2011 and multiple PCI afterwards, chronic systolic heart failure, persistent atrial fibrillation diagnosed 07/2016 (she is not on anticoagulation or Plavix due to gastric varices), hypertension, non-ETOH cirrhosis, DM, obesity, esophageal varices requiring banding, gastric varices, and hyperlipidemia.  Admitted for evaluation and treatment of A/C systolic HF   Assessment/Plan   1. A/C combined HF. ICM. Echo 03/2008: EF 30-35%, Echo 07/2010: EF 50%, LHC 11/16: EF 35-40%, LHC 6/18: EF 25-35% coronaries unchanged, Echo 12/2016: EF 30-35% RV moderately reduced, septal-lateral dyssynchrony, severe hypokinesis of the inferior,inferolateral, and anterolateral walls. Not referred for ICD due to advanced liver disease. - Echo 3/19 EF 40-45% (read is 50-55%) with mild RV HK and septal flattening.  - She has R > L HF symptoms with evidence of PAH on echo with RV strain - Volume status now improving. NYHA class IIIb - Continue lasix 80 IV tid and metolazone today.  Will need torsemide at DC  - Will plan RHC tomorrow to further evaluate for PAH.  - Continue irbesartan 150 mg daily > Takes valsartan at home. Allergic to Entresto (facial swelling) - Continue spiro to 25 mg daily  2. CAD s/p CABG 2011, multiple PCI, most recent LHC 6/18 with no significant changes and no intervention.  - Continue ASA, statin, zetia. LDL 54, HDL 38 this am.  - Continue imdur 60 mg daily - No further CP. Troponins negative x3  3. Paroxysmal atrial fibrillation. Diagnosed 07/2016 during hospitalization. Not a candidate for [facility] with esophageal varices, cirrhosis, and low platelets   - Maintaining NSR. Off b-blocker with HF. No AC with cirrhosis  4. Cirrhosis secondary to NASH. She has had esophageal varices treated with banding, but she now has gastric varices. - Follows with Dr. Laural Golden.  - Flex sigmoid 1/19 showed external hemorrhoids, otherwise normal. - Hemoglobin stable at 8.7 but  no further bleeding. Daily CBC.  - Off lovenox with hx of varices and bleeding yesterday. Continue SCDs + Ted hose for VTE prophylaxis. Encouraged ambulation. Discussed with PharmD.  5. NSVT - Quiescent Mag 2.5, K 4.5.   6. Anemia - hgb stable - Iron panel ok   Medication concerns reviewed with patient and pharmacy team. Barriers identified: none   Length of Stay: 4  Glori Bickers, MD  05/28/2017, 11:47 AM  Advanced Heart Failure Team Pager [phone] (M-F; 7a - 4p)  Please contact [facility] for night-coverage after hours (4p -7a ) and weekends on [email or web address]

## 2017-05-28 NOTE — Progress Notes (Signed)
NT reported that patient had bloody stool this afternoon. Patient states she has history of esophageal varices and does bleed occasionally but states this afternoon it's been more than normal. Cardiology text paged to make aware.

## 2017-05-28 NOTE — Progress Notes (Signed)
   CBC stable.   Candee Furbish, MD

## 2017-05-28 NOTE — Progress Notes (Signed)
   BRBPR - filled toilet with blood. She is not alarmed by this, stating this is common for her.  Flex sig recently (hemorroids).   Will stop ASA Stop lasix IV Stop metolazone.   Checking CBC now and in AM. If bleeding worsens, will get GI involved.  Discussed with Dr. Haroldine Laws.   Candee Furbish, MD

## 2017-05-29 ENCOUNTER — Inpatient Hospital Stay (HOSPITAL_COMMUNITY)
Admission: EM | Disposition: A | Discharge status: Home / Self Care | Payer: Self-pay | Source: Home / Self Care | Attending: Cardiovascular Disease

## 2017-05-29 ENCOUNTER — Encounter (HOSPITAL_COMMUNITY): Payer: Self-pay | Admitting: Internal Medicine

## 2017-05-29 HISTORY — PX: RIGHT HEART CATH: CATH118263

## 2017-05-29 LAB — POCT I-STAT 3, VENOUS BLOOD GAS (G3P V)
Acid-Base Excess: 4 mmol/L — ABNORMAL HIGH (ref 0.0–2.0)
Acid-Base Excess: 6 mmol/L — ABNORMAL HIGH (ref 0.0–2.0)
Acid-Base Excess: 7 mmol/L — ABNORMAL HIGH (ref 0.0–2.0)
Bicarbonate: 29.5 mmol/L — ABNORMAL HIGH (ref 20.0–28.0)
Bicarbonate: 31.5 mmol/L — ABNORMAL HIGH (ref 20.0–28.0)
Bicarbonate: 32.2 mmol/L — ABNORMAL HIGH (ref 20.0–28.0)
O2 Saturation: 67 %
O2 Saturation: 73 %
O2 Saturation: 76 %
TCO2: 31 mmol/L (ref 22–32)
TCO2: 33 mmol/L — ABNORMAL HIGH (ref 22–32)
TCO2: 34 mmol/L — ABNORMAL HIGH (ref 22–32)
pCO2, Ven: 45.3 mmHg (ref 44.0–60.0)
pCO2, Ven: 48.4 mmHg (ref 44.0–60.0)
pCO2, Ven: 50.1 mmHg (ref 44.0–60.0)
pH, Ven: 7.416 (ref 7.250–7.430)
pH, Ven: 7.421 (ref 7.250–7.430)
pH, Ven: 7.422 (ref 7.250–7.430)
pO2, Ven: 35 mmHg (ref 32.0–45.0)
pO2, Ven: 38 mmHg (ref 32.0–45.0)
pO2, Ven: 40 mmHg (ref 32.0–45.0)

## 2017-05-29 LAB — CBC
HCT: 29.9 % — ABNORMAL LOW (ref 36.0–46.0)
Hemoglobin: 8.9 g/dL — ABNORMAL LOW (ref 12.0–15.0)
MCH: 24.3 pg — ABNORMAL LOW (ref 26.0–34.0)
MCHC: 29.8 g/dL — ABNORMAL LOW (ref 30.0–36.0)
MCV: 81.5 fL (ref 78.0–100.0)
Platelets: 76 10*3/uL — ABNORMAL LOW (ref 150–400)
RBC: 3.67 MIL/uL — ABNORMAL LOW (ref 3.87–5.11)
RDW: 18.1 % — ABNORMAL HIGH (ref 11.5–15.5)
WBC: 3.2 10*3/uL — ABNORMAL LOW (ref 4.0–10.5)

## 2017-05-29 LAB — BASIC METABOLIC PANEL
Anion gap: 16 — ABNORMAL HIGH (ref 5–15)
BUN: 15 mg/dL (ref 6–20)
CO2: 29 mmol/L (ref 22–32)
Calcium: 9.6 mg/dL (ref 8.9–10.3)
Chloride: 94 mmol/L — ABNORMAL LOW (ref 101–111)
Creatinine, Ser: 1.19 mg/dL — ABNORMAL HIGH (ref 0.44–1.00)
GFR calc Af Amer: 53 mL/min — ABNORMAL LOW (ref >60)
GFR calc non Af Amer: 46 mL/min — ABNORMAL LOW (ref >60)
Glucose, Bld: 94 mg/dL (ref 65–99)
Potassium: 4.3 mmol/L (ref 3.5–5.1)
Sodium: 139 mmol/L (ref 135–145)

## 2017-05-29 LAB — POCT I-STAT 3, ART BLOOD GAS (G3+)
Acid-Base Excess: 1 mmol/L (ref 0.0–2.0)
Bicarbonate: 26.3 mmol/L (ref 20.0–28.0)
O2 Saturation: 96 %
TCO2: 28 mmol/L (ref 22–32)
pCO2 arterial: 45.6 mmHg (ref 32.0–48.0)
pH, Arterial: 7.369 (ref 7.350–7.450)
pO2, Arterial: 89 mmHg (ref 83.0–108.0)

## 2017-05-29 LAB — PROTIME-INR
INR: 1.17
Prothrombin Time: 14.8 seconds (ref 11.4–15.2)

## 2017-05-29 SURGERY — RIGHT HEART CATH
Anesthesia: LOCAL

## 2017-05-29 MED ORDER — SODIUM CHLORIDE 0.9% FLUSH: 3.0000 mL | Freq: Two times a day (BID) | INTRAVENOUS | Status: DC

## 2017-05-29 MED ORDER — MIDAZOLAM HCL 2 MG/2ML IJ SOLN
INTRAMUSCULAR | Status: DC | PRN
  Administered 2017-05-29 (×2): 1 mg via INTRAVENOUS

## 2017-05-29 MED ORDER — HEPARIN (PORCINE) IN NACL 2-0.9 UNIT/ML-% IJ SOLN
INTRAMUSCULAR | Status: AC | PRN
  Administered 2017-05-29: 500 mL

## 2017-05-29 MED ORDER — SODIUM CHLORIDE 0.9% FLUSH: 3.0000 mL | INTRAVENOUS | Status: DC | PRN

## 2017-05-29 MED ORDER — HEPARIN (PORCINE) IN NACL 2-0.9 UNIT/ML-% IJ SOLN
INTRAMUSCULAR | Status: AC
  Filled 2017-05-29: qty 1000

## 2017-05-29 MED ORDER — SODIUM CHLORIDE 0.9 % IV SOLN: 250.0000 mL | INTRAVENOUS | Status: DC | PRN

## 2017-05-29 MED ORDER — ONDANSETRON HCL 4 MG/2ML IJ SOLN: 4.0000 mg | Freq: Four times a day (QID) | INTRAMUSCULAR | Status: DC | PRN

## 2017-05-29 MED ORDER — LIDOCAINE HCL (PF) 1 % IJ SOLN
INTRAMUSCULAR | Status: DC | PRN
  Administered 2017-05-29: 18 mL

## 2017-05-29 MED ORDER — ACETAMINOPHEN 325 MG PO TABS: 650.0000 mg | ORAL_TABLET | ORAL | Status: DC | PRN

## 2017-05-29 MED ORDER — FENTANYL CITRATE (PF) 100 MCG/2ML IJ SOLN
INTRAMUSCULAR | Status: DC | PRN
  Administered 2017-05-29 (×2): 25 ug via INTRAVENOUS

## 2017-05-29 MED ORDER — MIDAZOLAM HCL 2 MG/2ML IJ SOLN
INTRAMUSCULAR | Status: AC
  Filled 2017-05-29: qty 2

## 2017-05-29 MED ORDER — SODIUM CHLORIDE 0.9 % IV SOLN: INTRAVENOUS | Status: DC

## 2017-05-29 MED ORDER — TORSEMIDE 20 MG PO TABS
40.0000 mg | ORAL_TABLET | Freq: Two times a day (BID) | ORAL | Status: DC
  Administered 2017-05-29 – 2017-05-30 (×2): 40 mg via ORAL
  Filled 2017-05-29 (×2): qty 2

## 2017-05-29 MED ORDER — SODIUM CHLORIDE 0.9% FLUSH
3.0000 mL | Freq: Two times a day (BID) | INTRAVENOUS | Status: DC
  Administered 2017-05-29 – 2017-05-30 (×2): 3 mL via INTRAVENOUS

## 2017-05-29 MED ORDER — LIDOCAINE HCL 1 % IJ SOLN
INTRAMUSCULAR | Status: AC
  Filled 2017-05-29: qty 20

## 2017-05-29 MED ORDER — FENTANYL CITRATE (PF) 100 MCG/2ML IJ SOLN
INTRAMUSCULAR | Status: AC
  Filled 2017-05-29: qty 2

## 2017-05-29 SURGICAL SUPPLY — 7 items
CATH SWAN GANZ 7F STRAIGHT (CATHETERS) ×1 IMPLANT
PACK CARDIAC CATHETERIZATION (CUSTOM PROCEDURE TRAY) ×2 IMPLANT
SHEATH AVANTI 11CM 7FR (SHEATH) ×1 IMPLANT
TRANSDUCER W/STOPCOCK (MISCELLANEOUS) ×2 IMPLANT
TUBING ART PRESS 72  MALE/FEM (TUBING) ×1
TUBING ART PRESS 72 MALE/FEM (TUBING) IMPLANT
WIRE EMERALD 3MM-J .025X260CM (WIRE) ×1 IMPLANT

## 2017-05-29 NOTE — Progress Notes (Signed)
Patient has maintained bedrest x 2 hr as ordered. Right groin level 0.   As level of activity has opened up for patient, patient has tolerated up to side of bed and BSC without difficulty. Right groin remains level 0.  Vital signs stable, no c/o discomfort.  Will continue  To monitor.

## 2017-05-29 NOTE — H&P (View-Only) (Signed)
Advanced Heart Failure Rounding Note  PCP-Cardiologist: Sherren Mocha, MD   Subjective:    - 5.7 L and down another 7 lbs (15 pounds total) with 80 mg IV lasix x2 + metolazone 2.5 mg.  Had another episode of BRBPR  yesterday. Hemoglobin stable 8.9. ASA was stopped.  No CP, SOB, or orthopnea. No longer feels bloated. Anxious about cath today.    Echo 3/28= EF 40-45% (read as 50-55%) with mild RV HK and septal flattening.    Objective:   Weight Range: 206 lb 8 oz (93.7 kg) Body mass index is 37.77 kg/m.   Vital Signs:   Temp:  [97.7 F (36.5 C)-98.6 F (37 C)] 97.7 F (36.5 C) (04/01 0348) Pulse Rate:  [61-75] 75 (04/01 0348) Resp:  [20] 20 (04/01 0348) BP: (96-123)/(32-75) 123/32 (04/01 0348) SpO2:  [91 %-97 %] 95 % (04/01 0348) Weight:  [206 lb 8 oz (93.7 kg)] 206 lb 8 oz (93.7 kg) (04/01 0348) Last BM Date: 05/28/17  Weight change: Filed Weights   05/27/17 0515 05/28/17 0640 05/29/17 0348  Weight: 215 lb 1.6 oz (97.6 kg) 213 lb 14.4 oz (97 kg) 206 lb 8 oz (93.7 kg)    Intake/Output:   Intake/Output Summary (Last 24 hours) at 05/29/2017 0738 Last data filed at 05/29/2017 0609 Gross per 24 hour  Intake 720 ml  Output 6450 ml  Net -5730 ml      Physical Exam    General: Obese. Sitting on side of bed. No resp difficulty. HEENT: Normal anicteric  Neck: Supple. JVP ~7. Carotids 2+ bilat; no bruits. No thyromegaly or nodule noted. Cor: PMI nondisplaced. RRR, No M/G/R noted Lungs: diminished throughout Abdomen: Obese Soft, non-tender, non-distended, no HSM. No bruits or masses. +BS  Extremities: No cyanosis, clubbing, or rash. R and LLE no edema. BLE TED hose Neuro: Alert & orientedx3, cranial nerves grossly intact. moves all 4 extremities w/o difficulty. Affect pleasant   Telemetry   SR 60-70s with 1st degree AV block. Personally reviewed.   EKG    No new tracings.   Labs    CBC Recent Labs    05/28/17 1608 05/29/17 0305  WBC 3.2* 3.2*  HGB  8.6* 8.9*  HCT 30.1* 29.9*  MCV 81.4 81.5  PLT 75* 76*   Basic Metabolic Panel Recent Labs    05/28/17 0547 05/29/17 0305  NA 137 139  K 4.5 4.3  CL 98* 94*  CO2 29 29  GLUCOSE 92 94  BUN 18 15  CREATININE 1.28* 1.19*  CALCIUM 8.8* 9.6   Liver Function Tests No results for input(s): AST, ALT, ALKPHOS, BILITOT, PROT, ALBUMIN in the last 72 hours. No results for input(s): LIPASE, AMYLASE in the last 72 hours. Cardiac Enzymes No results for input(s): CKTOTAL, CKMB, CKMBINDEX, TROPONINI in the last 72 hours.  BNP: BNP (last 3 results) Recent Labs    08/14/16 1414 05/24/17 1050  BNP 104.0* 186.7*    ProBNP (last 3 results) No results for input(s): PROBNP in the last 8760 hours.   D-Dimer No results for input(s): DDIMER in the last 72 hours. Hemoglobin A1C No results for input(s): HGBA1C in the last 72 hours. Fasting Lipid Panel No results for input(s): CHOL, HDL, LDLCALC, TRIG, CHOLHDL, LDLDIRECT in the last 72 hours. Thyroid Function Tests No results for input(s): TSH, T4TOTAL, T3FREE, THYROIDAB in the last 72 hours.  Invalid input(s): FREET3  Other results:   Imaging    No results found.   Medications:  Scheduled Medications: . atorvastatin  20 mg Oral QPM  . ezetimibe  10 mg Oral QHS  . FLUoxetine  40 mg Oral Daily  . irbesartan  150 mg Oral Daily  . isosorbide mononitrate  60 mg Oral Daily  . loratadine  10 mg Oral Daily  . pantoprazole  40 mg Oral Daily  . polyethylene glycol  17 g Oral Daily  . potassium chloride SA  20 mEq Oral BID  . sodium chloride flush  3 mL Intravenous Q12H  . spironolactone  25 mg Oral Daily    Infusions: . sodium chloride    . sodium chloride 10 mL/hr at 05/29/17 0604    PRN Medications: sodium chloride, albuterol, ALPRAZolam, calcium carbonate, fluticasone, guaiFENesin-dextromethorphan, HYDROcodone-acetaminophen, nitroGLYCERIN, ondansetron (ZOFRAN) IV, sodium chloride flush, sodium chloride flush,  zolpidem    Patient Profile   Samantha [name] is a 67 y.o. female with a history of CAD s/p CABG in 2011 and multiple PCI afterwards, chronic systolic heart failure, persistent atrial fibrillation diagnosed 07/2016 (she is not on anticoagulation or Plavix due to gastric varices), hypertension, non-ETOH cirrhosis, DM, obesity, esophageal varices requiring banding, gastric varices, and hyperlipidemia.  Admitted for evaluation and treatment of A/C systolic HF   Assessment/Plan   1. A/C combined HF. ICM. Echo 03/2008: EF 30-35%, Echo 07/2010: EF 50%, LHC 11/16: EF 35-40%, LHC 6/18: EF 25-35% coronaries unchanged, Echo 12/2016: EF 30-35% RV moderately reduced, septal-lateral dyssynchrony, severe hypokinesis of the inferior,inferolateral, and anterolateral walls. Not referred for ICD due to advanced liver disease. - Echo 3/19 EF 40-45% (read is 50-55%) with mild RV HK and septal flattening.  - She has R > L HF symptoms with evidence of PAH on echo with RV strain - Volume status stable. NYHA class III - Hold further diuresis. RHC today. May be able to transition to torsemide today. She was taking 80 mg lasix daily at home.  - Continue irbesartan 150 mg daily > Takes valsartan at home. Allergic to Entresto (facial swelling) - Continue spiro 25 mg daily - RHC today to further evaluate for PAH. She is on the schedule as an add on.    2. CAD s/p CABG 2011, multiple PCI, most recent LHC 6/18 with no significant changes and no intervention.  - Continue ASA, statin, zetia. LDL 54, HDL 3/29 - Continue imdur 60 mg daily - No further CP. Troponins negative x3  3. Paroxysmal atrial fibrillation. Diagnosed 07/2016 during hospitalization. Not a candidate for [facility] with esophageal varices, cirrhosis, and low platelets   - Maintaining NSR. Off b-blocker with HF. No AC with cirrhosis  4. Cirrhosis secondary to NASH. She has had esophageal varices treated with banding, but she now has gastric varices. - Follows  with Dr. Laural Golden.  - Flex sigmoid 1/19 showed external hemorrhoids, otherwise normal. - Hemoglobin stable at 8.9 today. Daily CBC.  - Off lovenox with hx of varices and bleeding yesterday. Continue SCDs + Ted hose for VTE prophylaxis. Encouraged ambulation. Discussed with PharmD. - BRBPR yesterday afternoon. ASA discontinued. No further bleeding.   5. NSVT - Quiescent, K 4.3. Will hold K supplement with diuretics on hold.  - No further VT  6. Anemia - hgb stable. 8.9 this am.  - Iron panel ok   Medication concerns reviewed with patient and pharmacy team. Barriers identified: none   Length of Stay: [place], NP  05/29/2017, 7:38 AM  Advanced Heart Failure Team Pager [phone] (M-F; 7a - 4p)  Please contact [facility] for night-coverage after hours (4p -7a ) and weekends on [email or web address]  Patient seen and examined with the above-signed Advanced Practice Provider and/or Housestaff. I personally reviewed laboratory data, imaging studies and relevant notes. I independently examined the patient and formulated the important aspects of the plan. I have edited the note to reflect any of my changes or salient points. I have personally discussed the plan with the patient and/or family.  Excellent diuresis over the past 2  Days on higher dose of lasix and metolazone. Feels much better. For RHC today to evaluate hemodynamics and assess for PAH. Recurrent rectal bleeding yesterday but hgb stable. Has outpatient GI f/u. NSVT has been quiescent.   Glori Bickers, MD  9:20 AM

## 2017-05-29 NOTE — Progress Notes (Addendum)
Site area: RFV  Site Prior to Removal:  Level 0 Pressure Applied For:10 min Manual:   yes Patient Status During Pull:  stable Post Pull Site:  Level 0 Post Pull Instructions Given:  yes Post Pull Pulses Present:  Dressing Applied:  tegaderm Bedrest begins @ 9169 till 1215 Comments:

## 2017-05-29 NOTE — Interval H&P Note (Signed)
History and Physical Interval Note:  05/29/2017 9:22 AM  Samantha [name]  has presented today for surgery, with the diagnosis of HF  The various methods of treatment have been discussed with the patient and family. After consideration of risks, benefits and other options for treatment, the patient has consented to  Procedure(s): RIGHT HEART CATH (N/A) as a surgical intervention .  The patient's history has been reviewed, patient examined, no change in status, stable for surgery.  I have reviewed the patient's chart and labs.  Questions were answered to the patient's satisfaction.     Lamar Meter

## 2017-05-29 NOTE — Progress Notes (Signed)
Per Dr. Zoila Shutter, hold asa at this time.

## 2017-05-29 NOTE — Care Management Important Message (Signed)
Important Message  Patient Details  Name: Samantha [name] MRN: [id number] Date of Birth: Sep 25, 1950   Medicare Important Message Given:  Yes    Orbie Pyo 05/29/2017, 2:42 PM

## 2017-05-29 NOTE — Progress Notes (Signed)
Patient has continued to do well this afternoon following right heart cath this am. Right groin remains level 0. Pt has been resting in bed intermittently.   Pt has transferred to [facility] without incidence, tolerated well.   No c/o discomfort voiced.

## 2017-05-29 NOTE — Progress Notes (Signed)
Patient requested to have CHG wipes after bath at 7am this morning.   Drue Flirt, RN

## 2017-05-29 NOTE — Progress Notes (Addendum)
Advanced Heart Failure Rounding Note  PCP-Cardiologist: Sherren Mocha, MD   Subjective:    - 5.7 L and down another 7 lbs (15 pounds total) with 80 mg IV lasix x2 + metolazone 2.5 mg.  Had another episode of BRBPR  yesterday. Hemoglobin stable 8.9. ASA was stopped.  No CP, SOB, or orthopnea. No longer feels bloated. Anxious about cath today.    Echo 3/28= EF 40-45% (read as 50-55%) with mild RV HK and septal flattening.    Objective:   Weight Range: 206 lb 8 oz (93.7 kg) Body mass index is 37.77 kg/m.   Vital Signs:   Temp:  [97.7 F (36.5 C)-98.6 F (37 C)] 97.7 F (36.5 C) (04/01 0348) Pulse Rate:  [61-75] 75 (04/01 0348) Resp:  [20] 20 (04/01 0348) BP: (96-123)/(32-75) 123/32 (04/01 0348) SpO2:  [91 %-97 %] 95 % (04/01 0348) Weight:  [206 lb 8 oz (93.7 kg)] 206 lb 8 oz (93.7 kg) (04/01 0348) Last BM Date: 05/28/17  Weight change: Filed Weights   05/27/17 0515 05/28/17 0640 05/29/17 0348  Weight: 215 lb 1.6 oz (97.6 kg) 213 lb 14.4 oz (97 kg) 206 lb 8 oz (93.7 kg)    Intake/Output:   Intake/Output Summary (Last 24 hours) at 05/29/2017 0738 Last data filed at 05/29/2017 0609 Gross per 24 hour  Intake 720 ml  Output 6450 ml  Net -5730 ml      Physical Exam    General: Obese. Sitting on side of bed. No resp difficulty. HEENT: Normal anicteric  Neck: Supple. JVP ~7. Carotids 2+ bilat; no bruits. No thyromegaly or nodule noted. Cor: PMI nondisplaced. RRR, No M/G/R noted Lungs: diminished throughout Abdomen: Obese Soft, non-tender, non-distended, no HSM. No bruits or masses. +BS  Extremities: No cyanosis, clubbing, or rash. R and LLE no edema. BLE TED hose Neuro: Alert & orientedx3, cranial nerves grossly intact. moves all 4 extremities w/o difficulty. Affect pleasant   Telemetry   SR 60-70s with 1st degree AV block. Personally reviewed.   EKG    No new tracings.   Labs    CBC Recent Labs    05/28/17 1608 05/29/17 0305  WBC 3.2* 3.2*  HGB  8.6* 8.9*  HCT 30.1* 29.9*  MCV 81.4 81.5  PLT 75* 76*   Basic Metabolic Panel Recent Labs    05/28/17 0547 05/29/17 0305  NA 137 139  K 4.5 4.3  CL 98* 94*  CO2 29 29  GLUCOSE 92 94  BUN 18 15  CREATININE 1.28* 1.19*  CALCIUM 8.8* 9.6   Liver Function Tests No results for input(s): AST, ALT, ALKPHOS, BILITOT, PROT, ALBUMIN in the last 72 hours. No results for input(s): LIPASE, AMYLASE in the last 72 hours. Cardiac Enzymes No results for input(s): CKTOTAL, CKMB, CKMBINDEX, TROPONINI in the last 72 hours.  BNP: BNP (last 3 results) Recent Labs    08/14/16 1414 05/24/17 1050  BNP 104.0* 186.7*    ProBNP (last 3 results) No results for input(s): PROBNP in the last 8760 hours.   D-Dimer No results for input(s): DDIMER in the last 72 hours. Hemoglobin A1C No results for input(s): HGBA1C in the last 72 hours. Fasting Lipid Panel No results for input(s): CHOL, HDL, LDLCALC, TRIG, CHOLHDL, LDLDIRECT in the last 72 hours. Thyroid Function Tests No results for input(s): TSH, T4TOTAL, T3FREE, THYROIDAB in the last 72 hours.  Invalid input(s): FREET3  Other results:   Imaging    No results found.   Medications:  Scheduled Medications: . atorvastatin  20 mg Oral QPM  . ezetimibe  10 mg Oral QHS  . FLUoxetine  40 mg Oral Daily  . irbesartan  150 mg Oral Daily  . isosorbide mononitrate  60 mg Oral Daily  . loratadine  10 mg Oral Daily  . pantoprazole  40 mg Oral Daily  . polyethylene glycol  17 g Oral Daily  . potassium chloride SA  20 mEq Oral BID  . sodium chloride flush  3 mL Intravenous Q12H  . spironolactone  25 mg Oral Daily    Infusions: . sodium chloride    . sodium chloride 10 mL/hr at 05/29/17 0604    PRN Medications: sodium chloride, albuterol, ALPRAZolam, calcium carbonate, fluticasone, guaiFENesin-dextromethorphan, HYDROcodone-acetaminophen, nitroGLYCERIN, ondansetron (ZOFRAN) IV, sodium chloride flush, sodium chloride flush,  zolpidem    Patient Profile   Samantha [name] is a 67 y.o. female with a history of CAD s/p CABG in 2011 and multiple PCI afterwards, chronic systolic heart failure, persistent atrial fibrillation diagnosed 07/2016 (she is not on anticoagulation or Plavix due to gastric varices), hypertension, non-ETOH cirrhosis, DM, obesity, esophageal varices requiring banding, gastric varices, and hyperlipidemia.  Admitted for evaluation and treatment of A/C systolic HF   Assessment/Plan   1. A/C combined HF. ICM. Echo 03/2008: EF 30-35%, Echo 07/2010: EF 50%, LHC 11/16: EF 35-40%, LHC 6/18: EF 25-35% coronaries unchanged, Echo 12/2016: EF 30-35% RV moderately reduced, septal-lateral dyssynchrony, severe hypokinesis of the inferior,inferolateral, and anterolateral walls. Not referred for ICD due to advanced liver disease. - Echo 3/19 EF 40-45% (read is 50-55%) with mild RV HK and septal flattening.  - She has R > L HF symptoms with evidence of PAH on echo with RV strain - Volume status stable. NYHA class III - Hold further diuresis. RHC today. May be able to transition to torsemide today. She was taking 80 mg lasix daily at home.  - Continue irbesartan 150 mg daily > Takes valsartan at home. Allergic to Entresto (facial swelling) - Continue spiro 25 mg daily - RHC today to further evaluate for PAH. She is on the schedule as an add on.    2. CAD s/p CABG 2011, multiple PCI, most recent LHC 6/18 with no significant changes and no intervention.  - Continue ASA, statin, zetia. LDL 54, HDL 3/29 - Continue imdur 60 mg daily - No further CP. Troponins negative x3  3. Paroxysmal atrial fibrillation. Diagnosed 07/2016 during hospitalization. Not a candidate for [facility] with esophageal varices, cirrhosis, and low platelets   - Maintaining NSR. Off b-blocker with HF. No AC with cirrhosis  4. Cirrhosis secondary to NASH. She has had esophageal varices treated with banding, but she now has gastric varices. - Follows  with Dr. Laural Golden.  - Flex sigmoid 1/19 showed external hemorrhoids, otherwise normal. - Hemoglobin stable at 8.9 today. Daily CBC.  - Off lovenox with hx of varices and bleeding yesterday. Continue SCDs + Ted hose for VTE prophylaxis. Encouraged ambulation. Discussed with PharmD. - BRBPR yesterday afternoon. ASA discontinued. No further bleeding.   5. NSVT - Quiescent, K 4.3. Will hold K supplement with diuretics on hold.  - No further VT  6. Anemia - hgb stable. 8.9 this am.  - Iron panel ok   Medication concerns reviewed with patient and pharmacy team. Barriers identified: none   Length of Stay: [place], NP  05/29/2017, 7:38 AM  Advanced Heart Failure Team Pager [phone] (M-F; 7a - 4p)  Please contact [facility] for night-coverage after hours (4p -7a ) and weekends on [email or web address]  Patient seen and examined with the above-signed Advanced Practice Provider and/or Housestaff. I personally reviewed laboratory data, imaging studies and relevant notes. I independently examined the patient and formulated the important aspects of the plan. I have edited the note to reflect any of my changes or salient points. I have personally discussed the plan with the patient and/or family.  Excellent diuresis over the past 2  Days on higher dose of lasix and metolazone. Feels much better. For RHC today to evaluate hemodynamics and assess for PAH. Recurrent rectal bleeding yesterday but hgb stable. Has outpatient GI f/u. NSVT has been quiescent.   Glori Bickers, MD  9:20 AM

## 2017-05-29 NOTE — Plan of Care (Signed)
  Problem: Cardiac: Goal: Ability to achieve and maintain adequate cardiopulmonary perfusion will improve Outcome: Progressing   

## 2017-05-30 LAB — BASIC METABOLIC PANEL
Anion gap: 13 (ref 5–15)
BUN: 17 mg/dL (ref 6–20)
CO2: 28 mmol/L (ref 22–32)
Calcium: 9.6 mg/dL (ref 8.9–10.3)
Chloride: 95 mmol/L — ABNORMAL LOW (ref 101–111)
Creatinine, Ser: 1.3 mg/dL — ABNORMAL HIGH (ref 0.44–1.00)
GFR calc Af Amer: 48 mL/min — ABNORMAL LOW (ref >60)
GFR calc non Af Amer: 41 mL/min — ABNORMAL LOW (ref >60)
Glucose, Bld: 126 mg/dL — ABNORMAL HIGH (ref 65–99)
Potassium: 4.4 mmol/L (ref 3.5–5.1)
Sodium: 136 mmol/L (ref 135–145)

## 2017-05-30 LAB — CBC
HCT: 34.4 % — ABNORMAL LOW (ref 36.0–46.0)
Hemoglobin: 10.1 g/dL — ABNORMAL LOW (ref 12.0–15.0)
MCH: 23.9 pg — ABNORMAL LOW (ref 26.0–34.0)
MCHC: 29.4 g/dL — ABNORMAL LOW (ref 30.0–36.0)
MCV: 81.5 fL (ref 78.0–100.0)
Platelets: 80 K/uL — ABNORMAL LOW (ref 150–400)
RBC: 4.22 MIL/uL (ref 3.87–5.11)
RDW: 18.3 % — ABNORMAL HIGH (ref 11.5–15.5)
WBC: 3.7 K/uL — ABNORMAL LOW (ref 4.0–10.5)

## 2017-05-30 MED ORDER — TORSEMIDE 20 MG PO TABS: 40.0000 mg | ORAL_TABLET | Freq: Two times a day (BID) | ORAL | 6 refills | Status: DC

## 2017-05-30 MED ORDER — SPIRONOLACTONE 25 MG PO TABS: 25.0000 mg | ORAL_TABLET | Freq: Every day | ORAL | 6 refills | Status: DC

## 2017-05-30 MED FILL — Heparin Sodium (Porcine) 2 Unit/ML in Sodium Chloride 0.9%: INTRAMUSCULAR | Qty: 500 | Status: AC

## 2017-05-30 MED FILL — Lidocaine HCl Local Inj 1%: INTRAMUSCULAR | Qty: 20 | Status: AC

## 2017-05-30 NOTE — Progress Notes (Addendum)
Advanced Heart Failure Rounding Note  PCP-Cardiologist: Sherren Mocha, MD   Subjective:    -2 L on PO torsemide (received 1 dose). Down another 4 lbs (19 lbs total)  No CP, SOB, dizziness, or bleeding. Hopeful to go home today.   Echo 3/28= EF 40-45% (read as 50-55%) with mild RV HK and septal flattening.   [place] 05/29/17: RA = 11 RV = 51/12 PA = 51/13 (32) PCW = 18 (v = 25-30) Fick cardiac output/index = 9.7/5.0 Thermo CO/CI = 7.6/3.9 PVR = 1.8 WU FA sat = 96% PA sat = 73%, 76% SVC sat = 67% Assessment: 1. Mild to moderately elevated R-sided pressures 2. Minimally elevated wedge pressure with prominent v-waves likely due to diastolic dysfunction 3. High-cardiac output likely due to cirrhotic physiology  4. No evidence of significant intra-cardiac shunting   Objective:   Weight Range: 202 lb 6.1 oz (91.8 kg) Body mass index is 37.02 kg/m.   Vital Signs:   Temp:  [98 F (36.7 C)-98.4 F (36.9 C)] 98 F (36.7 C) (04/02 0542) Pulse Rate:  [0-85] 80 (04/02 0542) Resp:  [0-20] 18 (04/02 0542) BP: (89-129)/(31-69) 119/48 (04/02 0542) SpO2:  [0 %-100 %] 93 % (04/02 0542) Weight:  [202 lb 6.1 oz (91.8 kg)] 202 lb 6.1 oz (91.8 kg) (04/02 0542) Last BM Date: 05/29/17  Weight change: Filed Weights   05/28/17 0640 05/29/17 0348 05/30/17 0542  Weight: 213 lb 14.4 oz (97 kg) 206 lb 8 oz (93.7 kg) 202 lb 6.1 oz (91.8 kg)    Intake/Output:   Intake/Output Summary (Last 24 hours) at 05/30/2017 0736 Last data filed at 05/30/2017 0546 Gross per 24 hour  Intake 528.17 ml  Output 2475 ml  Net -1946.83 ml      Physical Exam    General: Obese. No resp difficulty. HEENT: Normal Neck: Supple. JVP 8-10. Carotids 2+ bilat; no bruits. No thyromegaly or nodule noted. Cor: PMI nondisplaced. RRR, No M/G/R noted Lungs: clear, diminished. Abdomen: Soft, non-tender, non-distended, no HSM. No bruits or masses. +BS  Extremities: No cyanosis, clubbing, or rash. R and LLE no  edema. Right groin with gauze and tegaderm CDI, soft.  Neuro: Alert & orientedx3, cranial nerves grossly intact. moves all 4 extremities w/o difficulty. Affect pleasant   Telemetry   SR 70-80s with 1st degree AV block. Personally reviewed.   EKG    No new tracings.   Labs    CBC Recent Labs    05/29/17 0305 05/30/17 0455  WBC 3.2* 3.7*  HGB 8.9* 10.1*  HCT 29.9* 34.4*  MCV 81.5 81.5  PLT 76* 80*   Basic Metabolic Panel Recent Labs    05/29/17 0305 05/30/17 0455  NA 139 136  K 4.3 4.4  CL 94* 95*  CO2 29 28  GLUCOSE 94 126*  BUN 15 17  CREATININE 1.19* 1.30*  CALCIUM 9.6 9.6   Liver Function Tests No results for input(s): AST, ALT, ALKPHOS, BILITOT, PROT, ALBUMIN in the last 72 hours. No results for input(s): LIPASE, AMYLASE in the last 72 hours. Cardiac Enzymes No results for input(s): CKTOTAL, CKMB, CKMBINDEX, TROPONINI in the last 72 hours.  BNP: BNP (last 3 results) Recent Labs    08/14/16 1414 05/24/17 1050  BNP 104.0* 186.7*    ProBNP (last 3 results) No results for input(s): PROBNP in the last 8760 hours.   D-Dimer No results for input(s): DDIMER in the last 72 hours. Hemoglobin A1C No results for input(s): HGBA1C in the last  72 hours. Fasting Lipid Panel No results for input(s): CHOL, HDL, LDLCALC, TRIG, CHOLHDL, LDLDIRECT in the last 72 hours. Thyroid Function Tests No results for input(s): TSH, T4TOTAL, T3FREE, THYROIDAB in the last 72 hours.  Invalid input(s): FREET3  Other results:   Imaging    No results found.   Medications:     Scheduled Medications: . atorvastatin  20 mg Oral QPM  . ezetimibe  10 mg Oral QHS  . FLUoxetine  40 mg Oral Daily  . irbesartan  150 mg Oral Daily  . isosorbide mononitrate  60 mg Oral Daily  . loratadine  10 mg Oral Daily  . pantoprazole  40 mg Oral Daily  . polyethylene glycol  17 g Oral Daily  . sodium chloride flush  3 mL Intravenous Q12H  . spironolactone  25 mg Oral Daily  .  torsemide  40 mg Oral BID    Infusions: . sodium chloride      PRN Medications: sodium chloride, acetaminophen, albuterol, ALPRAZolam, calcium carbonate, fluticasone, guaiFENesin-dextromethorphan, HYDROcodone-acetaminophen, nitroGLYCERIN, ondansetron (ZOFRAN) IV, sodium chloride flush, sodium chloride flush, zolpidem    Patient Profile   Samantha [name] is a 67 y.o. female with a history of CAD s/p CABG in 2011 and multiple PCI afterwards, chronic systolic heart failure, persistent atrial fibrillation diagnosed 07/2016 (she is not on anticoagulation or Plavix due to gastric varices), hypertension, non-ETOH cirrhosis, DM, obesity, esophageal varices requiring banding, gastric varices, and hyperlipidemia.  Admitted for evaluation and treatment of A/C systolic HF   Assessment/Plan   1. A/C combined HF. ICM. Echo 03/2008: EF 30-35%, Echo 07/2010: EF 50%, LHC 11/16: EF 35-40%, LHC 6/18: EF 25-35% coronaries unchanged, Echo 12/2016: EF 30-35% RV moderately reduced, septal-lateral dyssynchrony, severe hypokinesis of the inferior,inferolateral, and anterolateral walls. Not referred for ICD due to advanced liver disease. - Echo 3/19 EF 40-45% (read is 50-55%) with mild RV HK and septal flattening.  - RHC 4/1: mild-moderate high right sided pressures, minimally elevated wedge pressure (likely due to diastolic dysfunction), high CO likely due to cirrhotic physiology.  - She has R > L HF symptoms with evidence of PAH on echo with RV strain - Volume status stable. NYHA class III - Transitioned to 40 mg PO torsemide BID yesterday.  - Continue irbesartan 150 mg daily > Takes valsartan at home. Allergic to Entresto (facial swelling) - Continue spiro 25 mg daily - Consider resuming home BB (Toprol XL 50 mg) - Discussed the importance of weighing daily, limiting fluid and salt intake, and when to call the clinic. Instructed to bring her medications to follow up.   2. CAD s/p CABG 2011, multiple PCI, most  recent LHC 6/18 with no significant changes and no intervention.  - Continue statin, zetia. LDL 54, HDL 3/29 - Continue imdur 60 mg daily - No further CP. Troponins negative x3 - ASA on hold for now with BRBPR 3/31  3. Paroxysmal atrial fibrillation. Diagnosed 07/2016 during hospitalization. Not a candidate for [facility] with esophageal varices, cirrhosis, and low platelets   - Maintaining NSR. Off b-blocker with HF. No AC with cirrhosis  4. Cirrhosis secondary to NASH. She has had esophageal varices treated with banding, but she now has gastric varices. - Follows with Dr. Laural Golden.  - Flex sigmoid 1/19 showed external hemorrhoids, otherwise normal. - Hemoglobin stable at 10.1 today. Daily CBC.  - Off lovenox with hx of varices and bleeding. Continue SCDs + Ted hose for VTE prophylaxis. Encouraged ambulation. Discussed with PharmD. - BRBPR 3/31.  No further bleeding.  - ASA on hold for now.   5. NSVT - Quies[place], K 4.4 - No further VT.   6. Anemia - hgb stable. 10.1 this am.  - Iron panel ok   Medication concerns reviewed with patient and pharmacy team. Barriers identified: none  Likely discharge to home today.  HF follow up has been arranged. She uses the [facility] in [place] for her pharmacy.   Length of Stay: [place], NP  05/30/2017, 7:36 AM  Advanced Heart Failure Team Pager [phone] (M-F; [place])  Please contact [facility] for night-coverage after hours (4p -7a ) and weekends on [email or web address]  Patient seen and examined with the above-signed Advanced Practice Provider and/or Housestaff. I personally reviewed laboratory data, imaging studies and relevant notes. I independently examined the patient and formulated the important aspects of the plan. I have edited the note to reflect any of my changes or salient points. I have personally discussed the plan with the patient and/or family.  [place] numbers reviewed personally. She has cirrhotic physiology.  She is much improved.  Responding well to torsemide. [place] for d/c today.Will need to watch renal function closely.   Glori Bickers, MD  8:32 AM

## 2017-05-30 NOTE — Discharge Summary (Addendum)
Advanced Heart Failure Discharge Note  Discharge Summary   Patient ID: Samantha [name] MRN: [id number], DOB/AGE: 1950-12-08 67 y.o. Admit date: 05/24/2017 D/C date:     05/30/2017   Primary Discharge Diagnoses:  1. A/C combined HF due to ICM - Echo 04/2017: EF 40-45% (read as 50-55%) with mild RV HK and septal flattening 2. CAD s/p CABG 2011 3. PAF - No AC with varices, cirrhosis, and low platelets 4. Cirrhosis secondary to NASH with gastric varices and low platelets - Follows with Dr. Laural Golden - Flex sigmoid 02/2017: external hemorrhoids, otherwise normal 5. NSVT 6. Anemia 7. Lower GI bleeding  Hospital Course: Samantha [name] is a 67 y.o. female with a history of CADs/pCABG in 2011 and multiple PCI afterwards, chronic systolic heart failure, persistentatrial fibrillationdiagnosed 07/2016(she is not on anticoagulation or Plavix due to gastric varices),hypertension, non-ETOH cirrhosis, DM, obesity, esophageal varices requiring banding, gastric varices,and hyperlipidemia.   She was admitted from [facility] clinic 05/24/17 with concerns for volume overload. She diuresed with IV lasix and metolazone, then transitioned to 40 mg PO torsemide BID (previously on lasix 80 mg daily). HF meds were optimized as tolerated. BB was held for acute exacerbation. RHC completed to evaluate for PAH, which showed minimally elevated wedge pressure and high CO likely related to cirrhotic pathology.   She had two episodes of BRBPR. This is consistent with her history of intermittent GI bleeds with gastric varices.  Lovenox and ASA were discontinued. Hemoglobin monitored and remained stable 8.4-10. Flex sigmoid in Jan 2019 normal except for external hemorrhoids. She follows with Dr Laural Golden.  She remained in NSR throughout admission. She has a history of PAF, but she is not on AC with varices, cirrhosis, and low platelets.   Overall pt diuresed 19 lbs. Examined am of 05/30/17 and determined stable for discharge with close  follow up as below. She will need a BMET and CBC at follow up.   Discharge Weight Range: 202 lbs Discharge Vitals: Blood pressure (!) 119/48, pulse 80, temperature 98 F (36.7 C), temperature source Oral, resp. rate 18, height 5' 2"  (1.575 m), weight 202 lb 6.1 oz (91.8 kg), last menstrual period 03/29/2011, SpO2 93 %.  Labs: Lab Results  Component Value Date   WBC 3.7 (L) 05/30/2017   HGB 10.1 (L) 05/30/2017   HCT 34.4 (L) 05/30/2017   MCV 81.5 05/30/2017   PLT 80 (L) 05/30/2017    Recent Labs  Lab 05/25/17 1030  05/30/17 0455  NA  --    < > 136  K  --    < > 4.4  CL  --    < > 95*  CO2  --    < > 28  BUN  --    < > 17  CREATININE  --    < > 1.30*  CALCIUM  --    < > 9.6  PROT 7.3  --   --   BILITOT 1.0  --   --   ALKPHOS 58  --   --   ALT 14  --   --   AST 41  --   --   GLUCOSE  --    < > 126*   < > = values in this interval not displayed.   Lab Results  Component Value Date   CHOL 111 05/26/2017   HDL 38 (L) 05/26/2017   LDLCALC 54 05/26/2017   TRIG 93 05/26/2017   BNP (last 3 results) Recent Labs  08/14/16 1414 05/24/17 1050  BNP 104.0* 186.7*    ProBNP (last 3 results) No results for input(s): PROBNP in the last 8760 hours.   Diagnostic Studies/Procedures   Echo 05/25/2017 - Left ventricle: The cavity size was normal. Wall thickness was   increased in a pattern of mild LVH. Systolic function was normal.   The estimated ejection fraction was in the range of 50% to 55%.   Images were inadequate for LV wall motion assessment. The study   is not technically sufficient to allow evaluation of LV diastolic   function. - Mitral valve: Calcified annulus. Mildly thickened leaflets .   There was mild regurgitation. - Left atrium: Moderately dilated. - Right ventricle: The cavity size was mildly dilated. Mildly   decreased systolic function. - Right atrium: Moderately dilated. - Atrial septum: No defect or patent foramen ovale was identified. - Tricuspid  valve: There was moderate regurgitation. - Pulmonic valve: Peak gradient (S): 27 mm Hg. - Pulmonary arteries: PA peak pressure: 54 mm Hg (S). - Inferior vena cava: The vessel was dilated. The respirophasic   diameter changes were blunted (< 50%), consistent with elevated   central venous pressure.  [place] 05/29/2017 Findings: RA = 11 RV = 51/12 PA = 51/13 (32) PCW = 18 (v = 25-30) Fick cardiac output/index = 9.7/5.0 Thermo CO/CI = 7.6/3.9 PVR = 1.8 WU FA sat = 96% PA sat = 73%, 76% SVC sat = 67% Assessment: 1. Mild to moderately elevated R-sided pressures 2. Minimally elevated wedge pressure with prominent v-waves likely due to diastolic dysfunction 3. High-cardiac output likely due to cirrhotic physiology  4. No evidence of significant intra-cardiac shunting  Discharge Medications   Allergies as of 05/30/2017      Reactions   Entresto [sacubitril-valsartan] Swelling   Swelling of eyes   Acetaminophen Other (See Comments)   Liver problems   Oxycodone Itching, Nausea Only   Ace Inhibitors Other (See Comments)   UNSURE OF REACTION TYPE   Cefaclor Itching, Rash   Cephalexin Itching, Rash   Penicillins Rash   Has patient had a PCN reaction causing immediate rash, facial/tongue/throat swelling, SOB or lightheadedness with hypotension: YES Has patient had a PCN reaction causing severe rash involving mucus membranes or skin necrosis: NO Has patient had a PCN reaction that required hospitalization: YES Has patient had a PCN reaction occurring within the last 10 years: NO If all of the above answers are "NO", then may proceed with Cephalosporin use.   Pregabalin Other (See Comments)   Retains fluid   Tape Itching, Rash   Takes skin right off with medical tape--PAPER TAPE ONLY Other reaction(s): Itching Takes skin right off with medical tape--PAPER TAPE ONLY Takes skin right off with medical tape--PAPER TAPE ONLY      Medication List    STOP taking these medications     furosemide 40 MG tablet Commonly known as:  LASIX   metoprolol succinate 50 MG 24 hr tablet Commonly known as:  TOPROL-XL     TAKE these medications   albuterol 108 (90 Base) MCG/ACT inhaler Commonly known as:  PROVENTIL HFA;VENTOLIN HFA Inhale 2 puffs into the lungs every 6 (six) hours as needed for wheezing or shortness of breath.   atorvastatin 20 MG tablet Commonly known as:  LIPITOR Take 20 mg by mouth every evening.   calcium carbonate 500 MG chewable tablet Commonly known as:  TUMS - dosed in mg elemental calcium Chew 1 tablet by mouth daily as needed for indigestion  or heartburn.   cetirizine 10 MG tablet Commonly known as:  ZYRTEC Take 10 mg by mouth daily as needed for allergies (during allergy season).   ezetimibe 10 MG tablet Commonly known as:  ZETIA Take 10 mg by mouth at bedtime.   FLUoxetine 40 MG capsule Commonly known as:  PROZAC Take 40 mg by mouth daily.   fluticasone 50 MCG/ACT nasal spray Commonly known as:  FLONASE Place 1 spray into both nostrils daily as needed for allergies.   HYDROcodone-acetaminophen 10-325 MG tablet Commonly known as:  NORCO Take 1 tablet by mouth every 6 (six) hours as needed (for pain.).   isosorbide mononitrate 60 MG 24 hr tablet Commonly known as:  IMDUR TAKE ONE TABLET BY MOUTH ONCE DAILY   NITROSTAT 0.4 MG SL tablet Generic drug:  nitroGLYCERIN Place 0.4 mg under the tongue every 5 (five) minutes as needed for chest pain (x 3 tabs daily).   pantoprazole 40 MG tablet Commonly known as:  PROTONIX Take 1 tablet (40 mg total) by mouth 2 (two) times daily. What changed:  when to take this   polyethylene glycol packet Commonly known as:  MIRALAX / GLYCOLAX Take 17 g by mouth daily.   spironolactone 25 MG tablet Commonly known as:  ALDACTONE Take 1 tablet (25 mg total) by mouth daily. Start taking on:  05/31/2017 What changed:  how much to take   torsemide 20 MG tablet Commonly known as:  DEMADEX Take 2  tablets (40 mg total) by mouth 2 (two) times daily.   valsartan 160 MG tablet Commonly known as:  DIOVAN Take 1 tablet (160 mg total) by mouth daily.       Disposition   The patient will be discharged in stable condition to home. Discharge Instructions    (HEART FAILURE PATIENTS) Call MD:  Anytime you have any of the following symptoms: 1) 3 pound weight gain in 24 hours or 5 pounds in 1 week 2) shortness of breath, with or without a dry hacking cough 3) swelling in the hands, feet or stomach 4) if you have to sleep on extra pillows at night in order to breathe.   Complete by:  As directed    Diet - low sodium heart healthy   Complete by:  As directed    Heart Failure patients record your daily weight using the same scale at the same time of day   Complete by:  As directed    Increase activity slowly   Complete by:  As directed      Follow-up Information    Clegg, Amy D, NP Follow up on 06/06/2017.   Specialty:  Cardiology Why:  1:30 p.m. Parking at ER lot (blue awning to left of ER), or underneath [place] (drive through construction entrance on [address] Code: 3716, elevator to 1st floor).  Take meds before appt, bring all med bottles to appt. Contact information: [address] [address] [phone]             Duration of Discharge Encounter: Greater than 35 minutes   Signed, Georgiana Shore, NP 05/30/2017, 11:26 AM  Patient seen and examined with the above-signed Advanced Practice Provider and/or Housestaff. I personally reviewed laboratory data, imaging studies and relevant notes. I independently examined the patient and formulated the important aspects of the plan. I have edited the note to reflect any of my changes or salient points. I have personally discussed the plan with the patient and/or family.  She  is much improved with IV diuresis with high-dose lasix and metolazone. RHC with mild PAH and cirrhotic physiology. Switched to  torsemide 40 daily and seems to be responding well. Will f/o closely in HF Clinic.   Glori Bickers, MD  3:54 PM

## 2017-06-01 ENCOUNTER — Telehealth (HOSPITAL_COMMUNITY): Payer: Self-pay | Admitting: *Deleted

## 2017-06-01 NOTE — Telephone Encounter (Signed)
Patient called c/o cramping all over but mostly back pain. Her bp 96/64, heart rate 44, weight down 2lbs (weight 200lbs). She was discharged from [facility]. On 4/2 has f/u with CHF clinic in 4/9. Per Jonni Sanger she should have labs drawn patient lives in [place] and does not have transportation to get labs. Per Jonni Sanger no changes until f/u appointment will check bmet and magnesium. Treat back pain with heating pad. Pt aware and agreeable with plan.

## 2017-06-06 ENCOUNTER — Encounter (HOSPITAL_COMMUNITY): Payer: Self-pay

## 2017-06-06 ENCOUNTER — Ambulatory Visit (HOSPITAL_COMMUNITY)
Admit: 2017-06-06 | Discharge: 2017-06-06 | Disposition: A | Discharge status: Home / Self Care | Payer: Medicare Other | Attending: Cardiology | Admitting: Cardiology

## 2017-06-06 VITALS — Wt 195.8 lb

## 2017-06-06 DIAGNOSIS — I110 Hypertensive heart disease with heart failure: Secondary | ICD-10-CM | POA: Diagnosis not present

## 2017-06-06 DIAGNOSIS — Z87891 Personal history of nicotine dependence: Secondary | ICD-10-CM | POA: Diagnosis not present

## 2017-06-06 DIAGNOSIS — I951 Orthostatic hypotension: Secondary | ICD-10-CM

## 2017-06-06 DIAGNOSIS — I481 Persistent atrial fibrillation: Secondary | ICD-10-CM | POA: Diagnosis not present

## 2017-06-06 DIAGNOSIS — I5040 Unspecified combined systolic (congestive) and diastolic (congestive) heart failure: Secondary | ICD-10-CM | POA: Diagnosis not present

## 2017-06-06 DIAGNOSIS — R42 Dizziness and giddiness: Secondary | ICD-10-CM | POA: Diagnosis not present

## 2017-06-06 DIAGNOSIS — E669 Obesity, unspecified: Secondary | ICD-10-CM | POA: Insufficient documentation

## 2017-06-06 DIAGNOSIS — K219 Gastro-esophageal reflux disease without esophagitis: Secondary | ICD-10-CM | POA: Diagnosis not present

## 2017-06-06 DIAGNOSIS — Z79899 Other long term (current) drug therapy: Secondary | ICD-10-CM | POA: Diagnosis not present

## 2017-06-06 DIAGNOSIS — Z8249 Family history of ischemic heart disease and other diseases of the circulatory system: Secondary | ICD-10-CM | POA: Insufficient documentation

## 2017-06-06 DIAGNOSIS — I864 Gastric varices: Secondary | ICD-10-CM | POA: Insufficient documentation

## 2017-06-06 DIAGNOSIS — M1990 Unspecified osteoarthritis, unspecified site: Secondary | ICD-10-CM | POA: Diagnosis not present

## 2017-06-06 DIAGNOSIS — E785 Hyperlipidemia, unspecified: Secondary | ICD-10-CM | POA: Diagnosis not present

## 2017-06-06 DIAGNOSIS — F329 Major depressive disorder, single episode, unspecified: Secondary | ICD-10-CM | POA: Diagnosis not present

## 2017-06-06 DIAGNOSIS — I8510 Secondary esophageal varices without bleeding: Secondary | ICD-10-CM | POA: Diagnosis not present

## 2017-06-06 DIAGNOSIS — Z951 Presence of aortocoronary bypass graft: Secondary | ICD-10-CM | POA: Diagnosis not present

## 2017-06-06 DIAGNOSIS — Z79891 Long term (current) use of opiate analgesic: Secondary | ICD-10-CM | POA: Diagnosis not present

## 2017-06-06 DIAGNOSIS — I472 Ventricular tachycardia: Secondary | ICD-10-CM | POA: Diagnosis not present

## 2017-06-06 DIAGNOSIS — N179 Acute kidney failure, unspecified: Secondary | ICD-10-CM | POA: Diagnosis not present

## 2017-06-06 DIAGNOSIS — I2510 Atherosclerotic heart disease of native coronary artery without angina pectoris: Secondary | ICD-10-CM | POA: Diagnosis not present

## 2017-06-06 DIAGNOSIS — E119 Type 2 diabetes mellitus without complications: Secondary | ICD-10-CM | POA: Diagnosis not present

## 2017-06-06 DIAGNOSIS — I5022 Chronic systolic (congestive) heart failure: Secondary | ICD-10-CM

## 2017-06-06 DIAGNOSIS — K7460 Unspecified cirrhosis of liver: Secondary | ICD-10-CM | POA: Diagnosis not present

## 2017-06-06 DIAGNOSIS — I4729 Other ventricular tachycardia: Secondary | ICD-10-CM

## 2017-06-06 DIAGNOSIS — I255 Ischemic cardiomyopathy: Secondary | ICD-10-CM | POA: Diagnosis not present

## 2017-06-06 LAB — CBC
HCT: 41.3 % (ref 36.0–46.0)
Hemoglobin: 12.5 g/dL (ref 12.0–15.0)
MCH: 24.1 pg — ABNORMAL LOW (ref 26.0–34.0)
MCHC: 30.3 g/dL (ref 30.0–36.0)
MCV: 79.7 fL (ref 78.0–100.0)
Platelets: 125 10*3/uL — ABNORMAL LOW (ref 150–400)
RBC: 5.18 MIL/uL — ABNORMAL HIGH (ref 3.87–5.11)
RDW: 18 % — ABNORMAL HIGH (ref 11.5–15.5)
WBC: 7 10*3/uL (ref 4.0–10.5)

## 2017-06-06 LAB — BASIC METABOLIC PANEL
Anion gap: 13 (ref 5–15)
BUN: 44 mg/dL — ABNORMAL HIGH (ref 6–20)
CO2: 23 mmol/L (ref 22–32)
Calcium: 10.3 mg/dL (ref 8.9–10.3)
Chloride: 99 mmol/L — ABNORMAL LOW (ref 101–111)
Creatinine, Ser: 2.61 mg/dL — ABNORMAL HIGH (ref 0.44–1.00)
GFR calc Af Amer: 21 mL/min — ABNORMAL LOW (ref >60)
GFR calc non Af Amer: 18 mL/min — ABNORMAL LOW (ref >60)
Glucose, Bld: 115 mg/dL — ABNORMAL HIGH (ref 65–99)
Potassium: 4.7 mmol/L (ref 3.5–5.1)
Sodium: 135 mmol/L (ref 135–145)

## 2017-06-06 LAB — BRAIN NATRIURETIC PEPTIDE: B Natriuretic Peptide: 33.6 pg/mL (ref 0.0–100.0)

## 2017-06-06 MED ORDER — TORSEMIDE 20 MG PO TABS: 20.0000 mg | ORAL_TABLET | Freq: Every day | ORAL | 6 refills | Status: DC

## 2017-06-06 NOTE — Progress Notes (Signed)
PCP: Primary HF Cardiologist: Dr Haroldine Laws   HPI: Samantha [name] is a 67 y.o. female with a history of CADs/pCABG in 2011 and multiple PCI afterwards, chronic systolic heart failure, persistentatrial fibrillationdiagnosed 07/2016(she is not on anticoagulation or Plavix due to gastric varices),hypertension, non-ETOH cirrhosis, DM, obesity, esophageal varices requiring banding, gastric varices,and hyperlipidemia.   Admitted 05/24/17 with volume overload. Diuresed with IV lasix and transitioned torsemide 40 mg twice a day. Has a couple episodes of BRBPR. Diuresed 19 pounds. Discharge weight 195 pounds.   Today she presents for post hospital follow. Overall feeling fair. Complaining of dizziness when she is walking and any change in position. Denies SOB/PND/Orthopnea. No chest pain. Appetite poor. Drinking < 24 ounces of fluid. No fever or chills. Weight at home has been 195-198 pounds. Taking all medications.   ECHO 04/2017 EF 50-55%. RV mildly dilated.   [place] 05/29/17: RA = 11 RV = 51/12 PA = 51/13 (32) PCW = 18 (v = 25-30) Fick cardiac output/index = 9.7/5.0 Thermo CO/CI = 7.6/3.9 PVR = 1.8 WU FA sat = 96% PA sat = 73%, 76% SVC sat = 67% Assessment: 1. Mild to moderately elevated R-sided pressures 2. Minimally elevated wedge pressure with prominent v-waves likely due to diastolic dysfunction 3. High-cardiac output likely due to cirrhotic physiology  4. No evidence of significant intra-cardiac shunting   ROS: All systems negative except as listed in HPI, PMH and Problem List.  SH:  Social History   Socioeconomic History  . Marital status: Divorced    Spouse name: Not on file  . Number of children: Not on file  . Years of education: Not on file  . Highest education level: Not on file  Occupational History  . Occupation: Disabled  Social Needs  . Financial resource strain: Not on file  . Food insecurity:    Worry: Not on file    Inability: Not on file  . Transportation  needs:    Medical: Not on file    Non-medical: Not on file  Tobacco Use  . Smoking status: Former Smoker    Packs/day: 0.50    Years: 20.00    Pack years: 10.00    Types: Cigarettes    Last attempt to quit: 07/21/1995    Years since quitting: 21.8  . Smokeless tobacco: Never Used  Substance and Sexual Activity  . Alcohol use: No  . Drug use: No  . Sexual activity: Not on file  Lifestyle  . Physical activity:    Days per week: Not on file    Minutes per session: Not on file  . Stress: Not on file  Relationships  . Social connections:    Talks on phone: Not on file    Gets together: Not on file    Attends religious service: Not on file    Active member of club or organization: Not on file    Attends meetings of clubs or organizations: Not on file    Relationship status: Not on file  . Intimate partner violence:    Fear of current or ex partner: Not on file    Emotionally abused: Not on file    Physically abused: Not on file    Forced sexual activity: Not on file  Other Topics Concern  . Not on file  Social History Narrative   Lives in [place] by herself.      FH:  Family History  Problem Relation Age of Onset  . Heart attack Mother   . Heart  attack Father 40       cause of death  . Coronary artery disease Sister        CABG   . Colon cancer Neg Hx     Past Medical History:  Diagnosis Date  . Chronic systolic CHF (congestive heart failure) ([place])   . Cirrhosis of liver ([place])   . Coronary artery disease    a. s/p CABG 2001 w/ (LIMA-OM, SVG-D1, SVG-RCA). b. h/o multiple PCIs per Dr. Antionette Char note.  . Depression   . Esophageal varices ([place])    New 2013  . Gastroesophageal reflux disease   . History of pneumonia   . Hyperlipidemia   . Hypertension   . Obesity   . Osteoarthritis   . Paroxysmal atrial flutter ([place])    a. dx 05/2016.  [name] Persistent atrial fibrillation ([place])    a. reported in hosp 07/2016, not on anticoag due to cirrhosis and liver disease, low  platelets, varices.  . Thrombocytopenia ([place])     Current Outpatient Medications  Medication Sig Dispense Refill  . albuterol (PROVENTIL HFA;VENTOLIN HFA) 108 (90 BASE) MCG/ACT inhaler Inhale 2 puffs into the lungs every 6 (six) hours as needed for wheezing or shortness of breath.     [name] atorvastatin (LIPITOR) 20 MG tablet Take 20 mg by mouth every evening.     . calcium carbonate (TUMS - DOSED IN MG ELEMENTAL CALCIUM) 500 MG chewable tablet Chew 1 tablet by mouth daily as needed for indigestion or heartburn.    . cetirizine (ZYRTEC) 10 MG tablet Take 10 mg by mouth daily as needed for allergies (during allergy season).     . ezetimibe (ZETIA) 10 MG tablet Take 10 mg by mouth at bedtime.     [name] FLUoxetine (PROZAC) 40 MG capsule Take 40 mg by mouth daily.     . fluticasone (FLONASE) 50 MCG/ACT nasal spray Place 1 spray into both nostrils daily as needed for allergies.     [name] HYDROcodone-acetaminophen (NORCO) 10-325 MG tablet Take 1 tablet by mouth every 6 (six) hours as needed (for pain.).     [name] isosorbide mononitrate (IMDUR) 60 MG 24 hr tablet TAKE ONE TABLET BY MOUTH ONCE DAILY 30 tablet 7  . pantoprazole (PROTONIX) 40 MG tablet Take 1 tablet (40 mg total) by mouth 2 (two) times daily. (Patient taking differently: Take 40 mg by mouth daily. ) 180 tablet 2  . polyethylene glycol (MIRALAX / GLYCOLAX) packet Take 17 g by mouth daily.    [name] spironolactone (ALDACTONE) 25 MG tablet Take 1 tablet (25 mg total) by mouth daily. 30 tablet 6  . torsemide (DEMADEX) 20 MG tablet Take 2 tablets (40 mg total) by mouth 2 (two) times daily. 120 tablet 6  . valsartan (DIOVAN) 160 MG tablet Take 1 tablet (160 mg total) by mouth daily. 30 tablet 11  . NITROSTAT 0.4 MG SL tablet Place 0.4 mg under the tongue every 5 (five) minutes as needed for chest pain (x 3 tabs daily).      No current facility-administered medications for this encounter.     Vitals:   06/06/17 1410  BP: (!) 102/56  Pulse: 95  SpO2: 95%    Weight: 195 lb 12.8 oz (88.8 kg)   ReDS Vest - 06/06/17 1400      ReDS Vest   MR   No    Estimated volume prior to reading  Med    Fitting Posture  Sitting    Height Marker  Short  Ruler Value  12    Center Strip  Aligned    ReDS Value  28     Sitting 102/56  Standing 84/50   PHYSICAL EXAM: General:  Appears tired. o resp difficulty HEENT: normal Neck: supple. JVP flat. Carotids 2+ bilaterally; no bruits. No lymphadenopathy or thryomegaly appreciated. Cor: PMI normal. Regular rate & rhythm. No rubs, gallops or murmurs. Lungs: clear Abdomen: soft, nontender, nondistended. No hepatosplenomegaly. No bruits or masses. Good bowel sounds. Extremities: no cyanosis, clubbing, rash, edema Neuro: alert & orientedx3, cranial nerves grossly intact. Moves all 4 extremities w/o difficulty. Affect pleasant.   ECG: NSR 93 bpm RBBB QRS 158    ASSESSMENT & PLAN:  1. Combined Systolic/Diastolic HF. ICM NYHAIII. Volume status low. Orthostatic today. Reds Vest 28%.  She was will hold torsemide for 3 days then start torsemide 20 mg daily. We need to allow weight to drift up. Needs to be 203-205 pounds.  No bb for now with fatigue.  Continue valsartan and spiro at current dose.  Not on entresto with allergy.   2. AKI  BMET today reviewed. Creatinine elevated in the setting of over diuresis.  Holding diuretics as above. Repeat BMET next week.   3. CAD S/P CABG 2011 most recent LHC 6/18 with no significant changes and no intervention Continue statin.  No asa with BRBPR  4. Cirrhosis secondary to NSA Has had esophageal varices.  Check CBC today.   5. NSVT- no bb for now.    Follow up in 1 week with BMET. If creatinine remains high will need to stop spiro and valsartan. She   Greater than 50% of the (total minutes 40 ) visit spent in counseling/coordination of care regarding EKG, reds vest results, medication changes, and I recommended that she have a higher sodium diet and increase  fluid intake.    Sereena Marando  NP-C  4:50 PM

## 2017-06-06 NOTE — Patient Instructions (Addendum)
HOLD Torsemide for 3 days Then DECREASE Torsemide to 20 mg, one tab daily  Your physician recommends that you schedule a follow-up appointment in: 1 weeks with Oda Kilts, PA  Do the following things EVERYDAY: 1) Weigh yourself in the morning before breakfast. Write it down and keep it in a log. 2) Take your medicines as prescribed 3) Eat low salt foods-Limit salt (sodium) to 2000 mg per day.  4) Stay as active as you can everyday 5) Limit all fluids for the day to less than 2 liters

## 2017-06-14 NOTE — Progress Notes (Signed)
PCP: Primary HF Cardiologist: Dr Haroldine Laws   HPI: Samantha [name] is a 67 y.o. female with a history of CADs/pCABG in 2011 and multiple PCI afterwards, chronic systolic heart failure, persistentatrial fibrillationdiagnosed 07/2016(she is not on anticoagulation or Plavix due to gastric varices),hypertension, non-ETOH cirrhosis, DM, obesity, esophageal varices requiring banding, gastric varices,and hyperlipidemia.   Admitted 05/24/17 with volume overload. Diuresed with IV lasix and transitioned torsemide 40 mg twice a day. Has a couple episodes of BRBPR. Diuresed 19 pounds. Discharge weight 195 pounds.   She presents today for weekly follow up. Last week torsemide held and cut back with orthostasis. Feeling much better. She states she remained slightly lightheaded with standing for the first several days, but it has improved.  She is drinking and eating more to keep up with her demand.  Weight at home up to 197.  She denies SOB/PND/Orthopnea.  She took her last valsartan yesterday, and has to get refill today. Otherwise she took all of her medications this am. Continues to watch her fluid and salt restriction very closely.   ECHO 04/2017 EF 50-55%. RV mildly dilated.   Samantha [name] 05/29/17: RA = 11 RV = 51/12 PA = 51/13 (32) PCW = 18 (v = 25-30) Fick cardiac output/index = 9.7/5.0 Thermo CO/CI = 7.6/3.9 PVR = 1.8 WU FA sat = 96% PA sat = 73%, 76% SVC sat = 67% Assessment: 1. Mild to moderately elevated R-sided pressures 2. Minimally elevated wedge pressure with prominent v-waves likely due to diastolic dysfunction 3. High-cardiac output likely due to cirrhotic physiology  4. No evidence of significant intra-cardiac shunting  Review of systems complete and found to be negative unless listed in HPI.    SH:  Social History   Socioeconomic History  . Marital status: Divorced    Spouse name: Not on file  . Number of children: Not on file  . Years of education: Not on file  . Highest  education level: Not on file  Occupational History  . Occupation: Disabled  Social Needs  . Financial resource strain: Not on file  . Food insecurity:    Worry: Not on file    Inability: Not on file  . Transportation needs:    Medical: Not on file    Non-medical: Not on file  Tobacco Use  . Smoking status: Former Smoker    Packs/day: 0.50    Years: 20.00    Pack years: 10.00    Types: Cigarettes    Last attempt to quit: 07/21/1995    Years since quitting: 21.9  . Smokeless tobacco: Never Used  Substance and Sexual Activity  . Alcohol use: No  . Drug use: No  . Sexual activity: Not on file  Lifestyle  . Physical activity:    Days per week: Not on file    Minutes per session: Not on file  . Stress: Not on file  Relationships  . Social connections:    Talks on phone: Not on file    Gets together: Not on file    Attends religious service: Not on file    Active member of club or organization: Not on file    Attends meetings of clubs or organizations: Not on file    Relationship status: Not on file  . Intimate partner violence:    Fear of current or ex partner: Not on file    Emotionally abused: Not on file    Physically abused: Not on file    Forced sexual activity: Not on file  Other Topics Concern  . Not on file  Social History Narrative   Lives in [place] by herself.      FH:  Family History  Problem Relation Age of Onset  . Heart attack Mother   . Heart attack Father 33       cause of death  . Coronary artery disease Sister        CABG   . Colon cancer Neg Hx     Past Medical History:  Diagnosis Date  . Chronic systolic CHF (congestive heart failure) ([place])   . Cirrhosis of liver ([place])   . Coronary artery disease    a. s/p CABG 2001 w/ (LIMA-OM, SVG-D1, SVG-RCA). b. h/o multiple PCIs per Dr. Antionette Char note.  . Depression   . Esophageal varices ([place])    New 2013  . Gastroesophageal reflux disease   . History of pneumonia   . Hyperlipidemia   .  Hypertension   . Obesity   . Osteoarthritis   . Paroxysmal atrial flutter ([place])    a. dx 05/2016.  [name] Persistent atrial fibrillation ([place])    a. reported in hosp 07/2016, not on anticoag due to cirrhosis and liver disease, low platelets, varices.  . Thrombocytopenia ([place])     Current Outpatient Medications  Medication Sig Dispense Refill  . albuterol (PROVENTIL HFA;VENTOLIN HFA) 108 (90 BASE) MCG/ACT inhaler Inhale 2 puffs into the lungs every 6 (six) hours as needed for wheezing or shortness of breath.     [name] atorvastatin (LIPITOR) 20 MG tablet Take 20 mg by mouth every evening.     . calcium carbonate (TUMS - DOSED IN MG ELEMENTAL CALCIUM) 500 MG chewable tablet Chew 1 tablet by mouth daily as needed for indigestion or heartburn.    . cetirizine (ZYRTEC) 10 MG tablet Take 10 mg by mouth daily as needed for allergies (during allergy season).     . ezetimibe (ZETIA) 10 MG tablet Take 10 mg by mouth at bedtime.     [name] FLUoxetine (PROZAC) 40 MG capsule Take 40 mg by mouth daily.     . fluticasone (FLONASE) 50 MCG/ACT nasal spray Place 1 spray into both nostrils daily as needed for allergies.     [name] HYDROcodone-acetaminophen (NORCO) 10-325 MG tablet Take 1 tablet by mouth every 6 (six) hours as needed (for pain.).     [name] isosorbide mononitrate (IMDUR) 60 MG 24 hr tablet TAKE ONE TABLET BY MOUTH ONCE DAILY 30 tablet 7  . NITROSTAT 0.4 MG SL tablet Place 0.4 mg under the tongue every 5 (five) minutes as needed for chest pain (x 3 tabs daily).     . pantoprazole (PROTONIX) 40 MG tablet Take 1 tablet (40 mg total) by mouth 2 (two) times daily. (Patient taking differently: Take 40 mg by mouth daily. ) 180 tablet 2  . polyethylene glycol (MIRALAX / GLYCOLAX) packet Take 17 g by mouth daily.    [name] spironolactone (ALDACTONE) 25 MG tablet Take 1 tablet (25 mg total) by mouth daily. 30 tablet 6  . torsemide (DEMADEX) 20 MG tablet Take 1 tablet (20 mg total) by mouth daily. 30 tablet 6  . valsartan (DIOVAN) 160  MG tablet Take 1 tablet (160 mg total) by mouth daily. 30 tablet 11   No current facility-administered medications for this visit.    Vitals:   06/15/17 1118  BP: (!) 110/58  Pulse: 88  SpO2: 98%  Weight: 199 lb 9.6 oz (90.5 kg)    Wt Readings from Last  3 Encounters:  06/15/17 199 lb 9.6 oz (90.5 kg)  06/06/17 195 lb 12.8 oz (88.8 kg)  05/30/17 202 lb 6.1 oz (91.8 kg)     PHYSICAL EXAM: General: Well appearing. No resp difficulty. HEENT: Normal Neck: Supple. JVP 5-6. Carotids 2+ bilat; no bruits. No thyromegaly or nodule noted. Cor: PMI nondisplaced. RRR, No M/G/R noted Lungs: CTAB, normal effort. Abdomen: Soft, non-tender, non-distended, no HSM. No bruits or masses. +BS  Extremities: No cyanosis, clubbing, or rash. R and LLE no edema.  Neuro: Alert & orientedx3, cranial nerves grossly intact. moves all 4 extremities w/o difficulty. Affect pleasant   ASSESSMENT & PLAN:  1. Combined Systolic/Diastolic HF. ICM - NYHA II symptoms currently.  - Volume status stable on exam.  - Continue torsemide 20 mg daily. Goal weight ~ 200 lbs. Discussed sliding scale diuretics at length. Should hold torsemide if weight 195 or less at home.  - No bb for now with fatigue.  - Move valsartan 160 mg to bedtime.  - Continue spiro 25 mg daily.  - Not on entresto with allergy.  - No room to increase meds with low BP.  - Reinforced fluid restriction to < 2 L daily, sodium restriction to less than 2000 mg daily, and the importance of daily weights. .   2. AKI  - BMET today. Torsemide held last week with AKI.   3. CAD S/P CABG 2011 most recent LHC 6/18 with no significant changes and no intervention - No s/s of ischemia.    - Continue statin.  - No asa with BRBPR  4. 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  - CBC stable last week.   5. NSVT - No BB for now  Doing well after recent med changes. RTC 2-4 weeks. No room to adjust meds further today. Change valsartan to  bedtime.   Shirley Friar, PA-C  4:14 PM  Greater than 50% of the 25 minute visit was spent in counseling/coordination of care regarding disease state education, salt/fluid restriction, sliding scale diuretics, and medication compliance.

## 2017-06-15 ENCOUNTER — Encounter (HOSPITAL_COMMUNITY): Payer: Self-pay

## 2017-06-15 ENCOUNTER — Ambulatory Visit (HOSPITAL_COMMUNITY)
Admission: RE | Admit: 2017-06-15 | Discharge: 2017-06-15 | Disposition: A | Discharge status: Home / Self Care | Payer: Medicare Other | Source: Ambulatory Visit | Attending: Internal Medicine | Admitting: Internal Medicine

## 2017-06-15 VITALS — BP 110/58 | HR 88 | Wt 199.6 lb

## 2017-06-15 DIAGNOSIS — I110 Hypertensive heart disease with heart failure: Secondary | ICD-10-CM | POA: Insufficient documentation

## 2017-06-15 DIAGNOSIS — Z6836 Body mass index (BMI) 36.0-36.9, adult: Secondary | ICD-10-CM | POA: Diagnosis not present

## 2017-06-15 DIAGNOSIS — Z79899 Other long term (current) drug therapy: Secondary | ICD-10-CM | POA: Insufficient documentation

## 2017-06-15 DIAGNOSIS — I255 Ischemic cardiomyopathy: Secondary | ICD-10-CM | POA: Diagnosis not present

## 2017-06-15 DIAGNOSIS — Z87891 Personal history of nicotine dependence: Secondary | ICD-10-CM | POA: Diagnosis not present

## 2017-06-15 DIAGNOSIS — E669 Obesity, unspecified: Secondary | ICD-10-CM | POA: Diagnosis not present

## 2017-06-15 DIAGNOSIS — N179 Acute kidney failure, unspecified: Secondary | ICD-10-CM | POA: Insufficient documentation

## 2017-06-15 DIAGNOSIS — E119 Type 2 diabetes mellitus without complications: Secondary | ICD-10-CM | POA: Insufficient documentation

## 2017-06-15 DIAGNOSIS — I481 Persistent atrial fibrillation: Secondary | ICD-10-CM

## 2017-06-15 DIAGNOSIS — Z951 Presence of aortocoronary bypass graft: Secondary | ICD-10-CM | POA: Insufficient documentation

## 2017-06-15 DIAGNOSIS — I5040 Unspecified combined systolic (congestive) and diastolic (congestive) heart failure: Secondary | ICD-10-CM | POA: Diagnosis not present

## 2017-06-15 DIAGNOSIS — K219 Gastro-esophageal reflux disease without esophagitis: Secondary | ICD-10-CM | POA: Diagnosis not present

## 2017-06-15 DIAGNOSIS — I2510 Atherosclerotic heart disease of native coronary artery without angina pectoris: Secondary | ICD-10-CM

## 2017-06-15 DIAGNOSIS — F329 Major depressive disorder, single episode, unspecified: Secondary | ICD-10-CM | POA: Insufficient documentation

## 2017-06-15 DIAGNOSIS — I5022 Chronic systolic (congestive) heart failure: Secondary | ICD-10-CM | POA: Diagnosis not present

## 2017-06-15 DIAGNOSIS — I4819 Other persistent atrial fibrillation: Secondary | ICD-10-CM

## 2017-06-15 DIAGNOSIS — E66812 Obesity, class 2: Secondary | ICD-10-CM

## 2017-06-15 DIAGNOSIS — I472 Ventricular tachycardia: Secondary | ICD-10-CM | POA: Diagnosis not present

## 2017-06-15 DIAGNOSIS — E785 Hyperlipidemia, unspecified: Secondary | ICD-10-CM | POA: Insufficient documentation

## 2017-06-15 DIAGNOSIS — K7460 Unspecified cirrhosis of liver: Secondary | ICD-10-CM | POA: Diagnosis not present

## 2017-06-15 LAB — BASIC METABOLIC PANEL WITH GFR
Anion gap: 10 (ref 5–15)
BUN: 23 mg/dL — ABNORMAL HIGH (ref 6–20)
CO2: 24 mmol/L (ref 22–32)
Calcium: 9.8 mg/dL (ref 8.9–10.3)
Chloride: 104 mmol/L (ref 101–111)
Creatinine, Ser: 1.55 mg/dL — ABNORMAL HIGH (ref 0.44–1.00)
GFR calc Af Amer: 39 mL/min — ABNORMAL LOW
GFR calc non Af Amer: 33 mL/min — ABNORMAL LOW
Glucose, Bld: 110 mg/dL — ABNORMAL HIGH (ref 65–99)
Potassium: 5.5 mmol/L — ABNORMAL HIGH (ref 3.5–5.1)
Sodium: 138 mmol/L (ref 135–145)

## 2017-06-15 MED ORDER — VALSARTAN 160 MG PO TABS: 160.0000 mg | ORAL_TABLET | Freq: Every day | ORAL | 11 refills | Status: DC

## 2017-06-15 NOTE — Patient Instructions (Signed)
Routine lab work today. Will notify you of abnormal results, otherwise no news is good news!  CHANGE Valsartan to take at bedtime.  Follow up 3 weeks.  ___________________________________________________________ Samantha [name] Code: 8550  Take all medication as prescribed the day of your appointment. Bring all medications with you to your appointment.  Do the following things EVERYDAY: 1) Weigh yourself in the morning before breakfast. Write it down and keep it in a log. 2) Take your medicines as prescribed 3) Eat low salt foods-Limit salt (sodium) to 2000 mg per day.  4) Stay as active as you can everyday 5) Limit all fluids for the day to less than 2 liters

## 2017-06-16 ENCOUNTER — Telehealth (HOSPITAL_COMMUNITY): Payer: Self-pay

## 2017-06-16 DIAGNOSIS — I5022 Chronic systolic (congestive) heart failure: Secondary | ICD-10-CM

## 2017-06-16 MED ORDER — SPIRONOLACTONE 25 MG PO TABS: 12.5000 mg | ORAL_TABLET | Freq: Every day | ORAL | 11 refills | Status: DC

## 2017-06-16 NOTE — Telephone Encounter (Addendum)
Result Notes for Basic metabolic panel   Notes recorded by Effie Berkshire, RN on 06/16/2017 at 1:44 PM EDT Aware and agreeable, lab scheduled for next Friday. States she does not take supplemental K nor does she recall ingesting higher amounts of K rich foods. ------  Notes recorded by Darron Doom, RN on 06/15/2017 at 2:13 PM EDT Called both phone numbers listed and left messages on both lines asking her to call us back. ------  Notes recorded by Shirley Friar, PA-C on 06/15/2017 at 1:08 PM EDT Creatinine much improved.   K elevated. Please make sure she is NOT taking potassium. If she is not, hold spiro x 2 days then resume at 12.5 mg daily. Needs repeat next week. May need Valt[place].    Legrand Como [address] [place], PA-C 06/15/2017 1:07 PM

## 2017-06-23 ENCOUNTER — Ambulatory Visit (HOSPITAL_COMMUNITY)
Admission: RE | Admit: 2017-06-23 | Discharge: 2017-06-23 | Disposition: A | Discharge status: Home / Self Care | Payer: Medicare Other | Source: Ambulatory Visit | Attending: Internal Medicine | Admitting: Internal Medicine

## 2017-06-23 DIAGNOSIS — I5022 Chronic systolic (congestive) heart failure: Secondary | ICD-10-CM | POA: Insufficient documentation

## 2017-06-23 LAB — BASIC METABOLIC PANEL
Anion gap: 8 (ref 5–15)
BUN: 14 mg/dL (ref 6–20)
CO2: 24 mmol/L (ref 22–32)
Calcium: 9.2 mg/dL (ref 8.9–10.3)
Chloride: 106 mmol/L (ref 101–111)
Creatinine, Ser: 1.2 mg/dL — ABNORMAL HIGH (ref 0.44–1.00)
GFR calc Af Amer: 53 mL/min — ABNORMAL LOW (ref >60)
GFR calc non Af Amer: 45 mL/min — ABNORMAL LOW (ref >60)
Glucose, Bld: 123 mg/dL — ABNORMAL HIGH (ref 65–99)
Potassium: 4.9 mmol/L (ref 3.5–5.1)
Sodium: 138 mmol/L (ref 135–145)

## 2017-07-06 ENCOUNTER — Encounter (HOSPITAL_COMMUNITY): Payer: Medicare Other

## 2017-07-11 ENCOUNTER — Ambulatory Visit (HOSPITAL_COMMUNITY)
Admission: RE | Admit: 2017-07-11 | Discharge: 2017-07-11 | Disposition: A | Discharge status: Home / Self Care | Payer: Medicare Other | Source: Ambulatory Visit | Attending: Cardiology | Admitting: Cardiology

## 2017-07-11 ENCOUNTER — Encounter (HOSPITAL_COMMUNITY): Payer: Self-pay

## 2017-07-11 ENCOUNTER — Telehealth (HOSPITAL_COMMUNITY): Payer: Self-pay | Admitting: *Deleted

## 2017-07-11 VITALS — BP 110/56 | HR 84 | Wt 206.0 lb

## 2017-07-11 DIAGNOSIS — Z87891 Personal history of nicotine dependence: Secondary | ICD-10-CM | POA: Diagnosis not present

## 2017-07-11 DIAGNOSIS — I2510 Atherosclerotic heart disease of native coronary artery without angina pectoris: Secondary | ICD-10-CM

## 2017-07-11 DIAGNOSIS — K7460 Unspecified cirrhosis of liver: Secondary | ICD-10-CM | POA: Diagnosis not present

## 2017-07-11 DIAGNOSIS — I481 Persistent atrial fibrillation: Secondary | ICD-10-CM | POA: Diagnosis not present

## 2017-07-11 DIAGNOSIS — Z79899 Other long term (current) drug therapy: Secondary | ICD-10-CM | POA: Diagnosis not present

## 2017-07-11 DIAGNOSIS — I5040 Unspecified combined systolic (congestive) and diastolic (congestive) heart failure: Secondary | ICD-10-CM | POA: Insufficient documentation

## 2017-07-11 DIAGNOSIS — I8510 Secondary esophageal varices without bleeding: Secondary | ICD-10-CM | POA: Diagnosis not present

## 2017-07-11 DIAGNOSIS — I480 Paroxysmal atrial fibrillation: Secondary | ICD-10-CM | POA: Diagnosis not present

## 2017-07-11 DIAGNOSIS — I5042 Chronic combined systolic (congestive) and diastolic (congestive) heart failure: Secondary | ICD-10-CM | POA: Diagnosis not present

## 2017-07-11 DIAGNOSIS — Z951 Presence of aortocoronary bypass graft: Secondary | ICD-10-CM | POA: Diagnosis not present

## 2017-07-11 DIAGNOSIS — I110 Hypertensive heart disease with heart failure: Secondary | ICD-10-CM | POA: Diagnosis not present

## 2017-07-11 DIAGNOSIS — K7469 Other cirrhosis of liver: Secondary | ICD-10-CM

## 2017-07-11 DIAGNOSIS — I472 Ventricular tachycardia: Secondary | ICD-10-CM | POA: Diagnosis not present

## 2017-07-11 DIAGNOSIS — N179 Acute kidney failure, unspecified: Secondary | ICD-10-CM

## 2017-07-11 DIAGNOSIS — K219 Gastro-esophageal reflux disease without esophagitis: Secondary | ICD-10-CM | POA: Insufficient documentation

## 2017-07-11 LAB — BASIC METABOLIC PANEL
Anion gap: 7 (ref 5–15)
BUN: 12 mg/dL (ref 6–20)
CO2: 28 mmol/L (ref 22–32)
Calcium: 9.7 mg/dL (ref 8.9–10.3)
Chloride: 108 mmol/L (ref 101–111)
Creatinine, Ser: 1.18 mg/dL — ABNORMAL HIGH (ref 0.44–1.00)
GFR calc Af Amer: 54 mL/min — ABNORMAL LOW (ref >60)
GFR calc non Af Amer: 46 mL/min — ABNORMAL LOW (ref >60)
Glucose, Bld: 108 mg/dL — ABNORMAL HIGH (ref 65–99)
Potassium: 5.5 mmol/L — ABNORMAL HIGH (ref 3.5–5.1)
Sodium: 143 mmol/L (ref 135–145)

## 2017-07-11 NOTE — Progress Notes (Signed)
PCP: Primary HF Cardiologist: Dr Haroldine Laws   HPI: Samantha [name] is a 67 y.o. female with a history of CADs/pCABG in 2011 and multiple PCI afterwards, chronic systolic heart failure, persistentatrial fibrillationdiagnosed 07/2016(she is not on anticoagulation or Plavix due to gastric varices),hypertension, non-ETOH cirrhosis, DM, obesity, esophageal varices requiring banding, gastric varices,and hyperlipidemia.   Admitted 05/24/17 with volume overload. Diuresed with IV lasix and transitioned torsemide 40 mg twice a day. Had a couple episodes of BRBPR. Diuresed 19 pounds. Discharge weight 195 pounds.   She presents today for HF follow up. Last visit valsartan dosing was changed to bedtime. Overall, she is doing okay. She has been having episodes of left-sided CP at night over the last 3 weeks. She is always at rest. The pain lasts a few minutes and resolves with SL nitro. It has been occurring almost nightly. Two nights ago, she had associated numbness down her left arm, but has not had any other associated symptoms. She had no symptoms prior to her CABG. No CP with activity. She denies SOB, orthopnea, and PND. She has occasional BLE edema when she is up all day. Appetite has been okay. Dizziness has resolved. She has very little energy and activity is limited by BLE pain. Weights at home have gone up 202-204 lbs. She took an extra torsemide once for 5 lb weight gain overnight. Compliant with medications. Limiting fluid and salt.  ECHO 04/2017 EF 50-55%. RV mildly dilated.   [place] 05/29/17: RA = 11 RV = 51/12 PA = 51/13 (32) PCW = 18 (v = 25-30) Fick cardiac output/index = 9.7/5.0 Thermo CO/CI = 7.6/3.9 PVR = 1.8 WU FA sat = 96% PA sat = 73%, 76% SVC sat = 67% Assessment: 1. Mild to moderately elevated R-sided pressures 2. Minimally elevated wedge pressure with prominent v-waves likely due to diastolic dysfunction 3. High-cardiac output likely due to cirrhotic physiology  4. No evidence  of significant intra-cardiac shunting  Review of systems complete and found to be negative unless listed in HPI.   SH:  Social History   Socioeconomic History  . Marital status: Divorced    Spouse name: Not on file  . Number of children: Not on file  . Years of education: Not on file  . Highest education level: Not on file  Occupational History  . Occupation: Disabled  Social Needs  . Financial resource strain: Not on file  . Food insecurity:    Worry: Not on file    Inability: Not on file  . Transportation needs:    Medical: Not on file    Non-medical: Not on file  Tobacco Use  . Smoking status: Former Smoker    Packs/day: 0.50    Years: 20.00    Pack years: 10.00    Types: Cigarettes    Last attempt to quit: 07/21/1995    Years since quitting: 21.9  . Smokeless tobacco: Never Used  Substance and Sexual Activity  . Alcohol use: No  . Drug use: No  . Sexual activity: Not on file  Lifestyle  . Physical activity:    Days per week: Not on file    Minutes per session: Not on file  . Stress: Not on file  Relationships  . Social connections:    Talks on phone: Not on file    Gets together: Not on file    Attends religious service: Not on file    Active member of club or organization: Not on file    Attends meetings  of clubs or organizations: Not on file    Relationship status: Not on file  . Intimate partner violence:    Fear of current or ex partner: Not on file    Emotionally abused: Not on file    Physically abused: Not on file    Forced sexual activity: Not on file  Other Topics Concern  . Not on file  Social History Narrative   Lives in [place] by herself.      FH:  Family History  Problem Relation Age of Onset  . Heart attack Mother   . Heart attack Father 9       cause of death  . Coronary artery disease Sister        CABG   . Colon cancer Neg Hx     Past Medical History:  Diagnosis Date  . Chronic systolic CHF (congestive heart failure) ([place])     . Cirrhosis of liver ([place])   . Coronary artery disease    a. s/p CABG 2001 w/ (LIMA-OM, SVG-D1, SVG-RCA). b. h/o multiple PCIs per Dr. Antionette Char note.  . Depression   . Esophageal varices ([place])    New 2013  . Gastroesophageal reflux disease   . History of pneumonia   . Hyperlipidemia   . Hypertension   . Obesity   . Osteoarthritis   . Paroxysmal atrial flutter ([place])    a. dx 05/2016.  [name] Persistent atrial fibrillation ([place])    a. reported in hosp 07/2016, not on anticoag due to cirrhosis and liver disease, low platelets, varices.  . Thrombocytopenia ([place])     Current Outpatient Medications  Medication Sig Dispense Refill  . albuterol (PROVENTIL HFA;VENTOLIN HFA) 108 (90 BASE) MCG/ACT inhaler Inhale 2 puffs into the lungs every 6 (six) hours as needed for wheezing or shortness of breath.     [name] atorvastatin (LIPITOR) 20 MG tablet Take 20 mg by mouth every evening.     . calcium carbonate (TUMS - DOSED IN MG ELEMENTAL CALCIUM) 500 MG chewable tablet Chew 1 tablet by mouth daily as needed for indigestion or heartburn.    . cetirizine (ZYRTEC) 10 MG tablet Take 10 mg by mouth daily as needed for allergies (during allergy season).     . ezetimibe (ZETIA) 10 MG tablet Take 10 mg by mouth at bedtime.     [name] FLUoxetine (PROZAC) 40 MG capsule Take 40 mg by mouth daily.     . fluticasone (FLONASE) 50 MCG/ACT nasal spray Place 1 spray into both nostrils daily as needed for allergies.     [name] HYDROcodone-acetaminophen (NORCO) 10-325 MG tablet Take 1 tablet by mouth every 6 (six) hours as needed (for pain.).     [name] isosorbide mononitrate (IMDUR) 60 MG 24 hr tablet TAKE ONE TABLET BY MOUTH ONCE DAILY 30 tablet 7  . NITROSTAT 0.4 MG SL tablet Place 0.4 mg under the tongue every 5 (five) minutes as needed for chest pain (x 3 tabs daily).     . pantoprazole (PROTONIX) 40 MG tablet Take 1 tablet (40 mg total) by mouth 2 (two) times daily. (Patient taking differently: Take 40 mg by mouth daily. ) 180 tablet 2   . polyethylene glycol (MIRALAX / GLYCOLAX) packet Take 17 g by mouth daily.    [name] spironolactone (ALDACTONE) 25 MG tablet Take 0.5 tablets (12.5 mg total) by mouth daily. 15 tablet 11  . torsemide (DEMADEX) 20 MG tablet Take 1 tablet (20 mg total) by mouth daily. 30 tablet 6  .  valsartan (DIOVAN) 160 MG tablet Take 1 tablet (160 mg total) by mouth at bedtime. 30 tablet 11   No current facility-administered medications for this encounter.    Vitals:   07/11/17 1152  BP: (!) 110/56  Pulse: 84  SpO2: 96%  Weight: 206 lb (93.4 kg)    Wt Readings from Last 3 Encounters:  07/11/17 206 lb (93.4 kg)  06/15/17 199 lb 9.6 oz (90.5 kg)  06/06/17 195 lb 12.8 oz (88.8 kg)     PHYSICAL EXAM: General: Well appearing. No resp difficulty. HEENT: Normal Neck: Supple. JVP ~10. Carotids 2+ bilat; no bruits. No thyromegaly or nodule noted. Cor: PMI nondisplaced. RRR, No M/G/R noted Lungs: CTAB, normal effort. Abdomen: Soft, non-tender, non-distended, no HSM. No bruits or masses. +BS  Extremities: No cyanosis, clubbing, or rash. R and LLE nonpitting edema.  Neuro: Alert & orientedx3, cranial nerves grossly intact. moves all 4 extremities w/o difficulty. Affect pleasant  EKG: NSR 83 bpm with sinus arrhthymia and IVCD. Personally reviewed.   ASSESSMENT & PLAN:  1. Combined Systolic/Diastolic HF. ICM - NYHA II symptoms. Echo 05/25/17: EF 50-55% - Volume status elevated on exam. - Increase torsemide to 40 mg daily for 2-3 days, then continue torsemide 20 mg daily. Goal weight ~ 200 lbs. Discussed sliding scale diuretics at length.  - No bb for now with ongoing fatigue.  - Continue valsartan 160 mg at bedtime.  - Continue spiro 12.5 mg daily.  - Not on entresto with allergy.  - Reinforced fluid restriction to < 2 L daily, sodium restriction to less than 2000 mg daily, and the importance of daily weights. .   2. AKI  - Much improved. Creatinine 1.2 on most recent BMET.  - Check BMET today  3.  CAD S/P CABG 2011 most recent LHC 6/18 with no significant changes and no intervention - Continue statin.  - No ASA with BRBPR/gastric varicies - For the last 3 weeks, she has been having nightly CP at rest that resolves with 1 SL nitro. She had no CP prior to her CABG. The pain does not occur with activity. Sounds like CP may be related to volume. Recent LHC is reassuring. She was instructed to seek care if CP does not improve. Will discuss with MD.    4. 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 Denies bleeding.  5. NSVT - No BB for now. No change.   6. Paroxysmal Atrial fibrillation - NSR on EKG today - Not on AC due to cirrhosis with esophageal varicies   Follow up in 3 weeks Increase torsemide as above. Labs today.    Georgiana Shore, NP  11:54 AM   Greater than 50% of the 25 minute visit was spent in counseling/coordination of care regarding disease state education, salt/fluid restriction, sliding scale diuretics, and medication compliance.

## 2017-07-11 NOTE — Patient Instructions (Signed)
Labs today (will call for abnormal results, otherwise no news is good news)  INCREASE Torsemide to 40 mg for 2-3 days, then resume previous dose.   Follow up in 3 weeks.

## 2017-07-11 NOTE — Telephone Encounter (Signed)
Result Notes for Basic Metabolic Panel (BMET)   Notes recorded by Darron Doom, RN on 07/11/2017 at 2:38 PM EDT Called and spoke with patient, she is agreeable with plan. She has been eating "a lot" of bananas but will try to avoid them now. She has lab appointment with her PCP next Wednesday, I will fax prescription to them to draw bmet and fax results to our office. ------  Notes recorded by Georgiana Shore, NP on 07/11/2017 at 1:55 PM EDT Please call and have her stop spiro because her potassium is elevated. Advise her not to eat high potassium foods. We will need to check BMET next week. May need to consider veltassa for her.

## 2017-07-12 ENCOUNTER — Other Ambulatory Visit (HOSPITAL_COMMUNITY): Payer: Self-pay

## 2017-07-19 DIAGNOSIS — J441 Chronic obstructive pulmonary disease with (acute) exacerbation: Secondary | ICD-10-CM | POA: Diagnosis not present

## 2017-07-19 DIAGNOSIS — I10 Essential (primary) hypertension: Secondary | ICD-10-CM | POA: Diagnosis not present

## 2017-07-19 DIAGNOSIS — K7460 Unspecified cirrhosis of liver: Secondary | ICD-10-CM | POA: Diagnosis not present

## 2017-07-19 DIAGNOSIS — D509 Iron deficiency anemia, unspecified: Secondary | ICD-10-CM | POA: Diagnosis not present

## 2017-07-19 DIAGNOSIS — E782 Mixed hyperlipidemia: Secondary | ICD-10-CM | POA: Diagnosis not present

## 2017-07-19 DIAGNOSIS — I5020 Unspecified systolic (congestive) heart failure: Secondary | ICD-10-CM | POA: Diagnosis not present

## 2017-07-19 DIAGNOSIS — I5022 Chronic systolic (congestive) heart failure: Secondary | ICD-10-CM | POA: Diagnosis not present

## 2017-07-19 DIAGNOSIS — E1140 Type 2 diabetes mellitus with diabetic neuropathy, unspecified: Secondary | ICD-10-CM | POA: Diagnosis not present

## 2017-07-19 DIAGNOSIS — E7800 Pure hypercholesterolemia, unspecified: Secondary | ICD-10-CM | POA: Diagnosis not present

## 2017-07-19 DIAGNOSIS — I959 Hypotension, unspecified: Secondary | ICD-10-CM | POA: Diagnosis not present

## 2017-07-25 DIAGNOSIS — D72819 Decreased white blood cell count, unspecified: Secondary | ICD-10-CM | POA: Diagnosis not present

## 2017-07-25 DIAGNOSIS — D696 Thrombocytopenia, unspecified: Secondary | ICD-10-CM | POA: Diagnosis not present

## 2017-07-25 DIAGNOSIS — F3342 Major depressive disorder, recurrent, in full remission: Secondary | ICD-10-CM | POA: Diagnosis not present

## 2017-07-25 DIAGNOSIS — I10 Essential (primary) hypertension: Secondary | ICD-10-CM | POA: Diagnosis not present

## 2017-07-25 DIAGNOSIS — E119 Type 2 diabetes mellitus without complications: Secondary | ICD-10-CM | POA: Diagnosis not present

## 2017-07-25 DIAGNOSIS — G4709 Other insomnia: Secondary | ICD-10-CM | POA: Diagnosis not present

## 2017-07-25 DIAGNOSIS — D649 Anemia, unspecified: Secondary | ICD-10-CM | POA: Diagnosis not present

## 2017-07-25 DIAGNOSIS — Z6836 Body mass index (BMI) 36.0-36.9, adult: Secondary | ICD-10-CM | POA: Diagnosis not present

## 2017-08-02 ENCOUNTER — Ambulatory Visit (HOSPITAL_COMMUNITY)
Admission: RE | Admit: 2017-08-02 | Discharge: 2017-08-02 | Disposition: A | Discharge status: Home / Self Care | Payer: Medicare Other | Source: Ambulatory Visit | Attending: Internal Medicine | Admitting: Internal Medicine

## 2017-08-02 VITALS — BP 112/76 | HR 82 | Wt 210.0 lb

## 2017-08-02 DIAGNOSIS — Z951 Presence of aortocoronary bypass graft: Secondary | ICD-10-CM | POA: Diagnosis not present

## 2017-08-02 DIAGNOSIS — I480 Paroxysmal atrial fibrillation: Secondary | ICD-10-CM | POA: Insufficient documentation

## 2017-08-02 DIAGNOSIS — I2510 Atherosclerotic heart disease of native coronary artery without angina pectoris: Secondary | ICD-10-CM | POA: Diagnosis not present

## 2017-08-02 DIAGNOSIS — I5042 Chronic combined systolic (congestive) and diastolic (congestive) heart failure: Secondary | ICD-10-CM | POA: Insufficient documentation

## 2017-08-02 DIAGNOSIS — Z79899 Other long term (current) drug therapy: Secondary | ICD-10-CM | POA: Insufficient documentation

## 2017-08-02 DIAGNOSIS — K7460 Unspecified cirrhosis of liver: Secondary | ICD-10-CM | POA: Insufficient documentation

## 2017-08-02 DIAGNOSIS — E785 Hyperlipidemia, unspecified: Secondary | ICD-10-CM | POA: Insufficient documentation

## 2017-08-02 DIAGNOSIS — E669 Obesity, unspecified: Secondary | ICD-10-CM | POA: Insufficient documentation

## 2017-08-02 DIAGNOSIS — N179 Acute kidney failure, unspecified: Secondary | ICD-10-CM | POA: Insufficient documentation

## 2017-08-02 DIAGNOSIS — K219 Gastro-esophageal reflux disease without esophagitis: Secondary | ICD-10-CM | POA: Diagnosis not present

## 2017-08-02 DIAGNOSIS — I255 Ischemic cardiomyopathy: Secondary | ICD-10-CM

## 2017-08-02 DIAGNOSIS — E875 Hyperkalemia: Secondary | ICD-10-CM | POA: Insufficient documentation

## 2017-08-02 DIAGNOSIS — I8510 Secondary esophageal varices without bleeding: Secondary | ICD-10-CM | POA: Diagnosis not present

## 2017-08-02 DIAGNOSIS — I472 Ventricular tachycardia: Secondary | ICD-10-CM | POA: Diagnosis not present

## 2017-08-02 DIAGNOSIS — I110 Hypertensive heart disease with heart failure: Secondary | ICD-10-CM | POA: Diagnosis not present

## 2017-08-02 LAB — BASIC METABOLIC PANEL
Anion gap: 8 (ref 5–15)
BUN: 7 mg/dL (ref 6–20)
CO2: 28 mmol/L (ref 22–32)
Calcium: 8.8 mg/dL — ABNORMAL LOW (ref 8.9–10.3)
Chloride: 103 mmol/L (ref 101–111)
Creatinine, Ser: 0.96 mg/dL (ref 0.44–1.00)
GFR calc Af Amer: >60 mL/min (ref >60)
GFR calc non Af Amer: 59 mL/min — ABNORMAL LOW (ref >60)
Glucose, Bld: 87 mg/dL (ref 65–99)
Potassium: 4 mmol/L (ref 3.5–5.1)
Sodium: 139 mmol/L (ref 135–145)

## 2017-08-02 NOTE — Progress Notes (Signed)
PCP: Primary HF Cardiologist: Dr Haroldine Laws   HPI: Samantha [name] is a 67 y.o. female with a history of CADs/pCABG in 2011 and multiple PCI afterwards, chronic systolic heart failure, persistentatrial fibrillationdiagnosed 07/2016(she is not on anticoagulation or Plavix due to gastric varices),hypertension, non-ETOH cirrhosis, DM, obesity, esophageal varices requiring banding, gastric varices,and hyperlipidemia.   Admitted 05/24/17 with volume overload. Diuresed with IV lasix and transitioned torsemide 40 mg twice a day. Had a couple episodes of BRBPR. Diuresed 19 pounds. Discharge weight 195 pounds.   Today she returns for HF follow up. Overall feeling fine. Denies SOB/PND/Orthopnea. Appetite ok. She is not eating much but has had ham. No fever or chills. Weight at home went up 4 pounds and she took and extra torsemide. Taking all medications.  ECHO 04/2017 EF 50-55%. RV mildly dilated.   [place] 05/29/17: RA = 11 RV = 51/12 PA = 51/13 (32) PCW = 18 (v = 25-30) Fick cardiac output/index = 9.7/5.0 Thermo CO/CI = 7.6/3.9 PVR = 1.8 WU FA sat = 96% PA sat = 73%, 76% SVC sat = 67% Assessment: 1. Mild to moderately elevated R-sided pressures 2. Minimally elevated wedge pressure with prominent v-waves likely due to diastolic dysfunction 3. High-cardiac output likely due to cirrhotic physiology  4. No evidence of significant intra-cardiac shunting  Review of systems complete and found to be negative unless listed in HPI.   SH:  Social History   Socioeconomic History  . Marital status: Divorced    Spouse name: Not on file  . Number of children: Not on file  . Years of education: Not on file  . Highest education level: Not on file  Occupational History  . Occupation: Disabled  Social Needs  . Financial resource strain: Not on file  . Food insecurity:    Worry: Not on file    Inability: Not on file  . Transportation needs:    Medical: Not on file    Non-medical: Not on file    Tobacco Use  . Smoking status: Former Smoker    Packs/day: 0.50    Years: 20.00    Pack years: 10.00    Types: Cigarettes    Last attempt to quit: 07/21/1995    Years since quitting: 22.0  . Smokeless tobacco: Never Used  Substance and Sexual Activity  . Alcohol use: No  . Drug use: No  . Sexual activity: Not on file  Lifestyle  . Physical activity:    Days per week: Not on file    Minutes per session: Not on file  . Stress: Not on file  Relationships  . Social connections:    Talks on phone: Not on file    Gets together: Not on file    Attends religious service: Not on file    Active member of club or organization: Not on file    Attends meetings of clubs or organizations: Not on file    Relationship status: Not on file  . Intimate partner violence:    Fear of current or ex partner: Not on file    Emotionally abused: Not on file    Physically abused: Not on file    Forced sexual activity: Not on file  Other Topics Concern  . Not on file  Social History Narrative   Lives in [place] by herself.      FH:  Family History  Problem Relation Age of Onset  . Heart attack Mother   . Heart attack Father 45  cause of death  . Coronary artery disease Sister        CABG   . Colon cancer Neg Hx     Past Medical History:  Diagnosis Date  . Chronic systolic CHF (congestive heart failure) ([place])   . Cirrhosis of liver ([place])   . Coronary artery disease    a. s/p CABG 2001 w/ (LIMA-OM, SVG-D1, SVG-RCA). b. h/o multiple PCIs per Dr. Antionette Char note.  . Depression   . Esophageal varices ([place])    New 2013  . Gastroesophageal reflux disease   . History of pneumonia   . Hyperlipidemia   . Hypertension   . Obesity   . Osteoarthritis   . Paroxysmal atrial flutter ([place])    a. dx 05/2016.  [name] Persistent atrial fibrillation ([place])    a. reported in hosp 07/2016, not on anticoag due to cirrhosis and liver disease, low platelets, varices.  . Thrombocytopenia ([place])     Current  Outpatient Medications  Medication Sig Dispense Refill  . albuterol (PROVENTIL HFA;VENTOLIN HFA) 108 (90 BASE) MCG/ACT inhaler Inhale 2 puffs into the lungs every 6 (six) hours as needed for wheezing or shortness of breath.     [name] atorvastatin (LIPITOR) 20 MG tablet Take 20 mg by mouth every evening.     . calcium carbonate (TUMS - DOSED IN MG ELEMENTAL CALCIUM) 500 MG chewable tablet Chew 1 tablet by mouth daily as needed for indigestion or heartburn.    . cetirizine (ZYRTEC) 10 MG tablet Take 10 mg by mouth daily as needed for allergies (during allergy season).     . ezetimibe (ZETIA) 10 MG tablet Take 10 mg by mouth at bedtime.     [name] FLUoxetine (PROZAC) 40 MG capsule Take 40 mg by mouth daily.     . fluticasone (FLONASE) 50 MCG/ACT nasal spray Place 1 spray into both nostrils daily as needed for allergies.     [name] HYDROcodone-acetaminophen (NORCO) 10-325 MG tablet Take 1 tablet by mouth every 6 (six) hours as needed (for pain.).     [name] isosorbide mononitrate (IMDUR) 60 MG 24 hr tablet TAKE ONE TABLET BY MOUTH ONCE DAILY 30 tablet 7  . NITROSTAT 0.4 MG SL tablet Place 0.4 mg under the tongue every 5 (five) minutes as needed for chest pain (x 3 tabs daily).     . pantoprazole (PROTONIX) 40 MG tablet Take 1 tablet (40 mg total) by mouth 2 (two) times daily. (Patient taking differently: Take 40 mg by mouth daily. ) 180 tablet 2  . polyethylene glycol (MIRALAX / GLYCOLAX) packet Take 17 g by mouth daily.    [name] torsemide (DEMADEX) 20 MG tablet Take 1 tablet (20 mg total) by mouth daily. 30 tablet 6  . valsartan (DIOVAN) 160 MG tablet Take 1 tablet (160 mg total) by mouth at bedtime. 30 tablet 11   No current facility-administered medications for this encounter.    Vitals:   08/02/17 1125  BP: 112/76  Pulse: 82  SpO2: 97%  Weight: 210 lb (95.3 kg)    Wt Readings from Last 3 Encounters:  08/02/17 210 lb (95.3 kg)  07/11/17 206 lb (93.4 kg)  06/15/17 199 lb 9.6 oz (90.5 kg)     PHYSICAL  EXAM: General:  Well appearing. No resp difficulty HEENT: normal Neck: supple. no JVD. Carotids 2+ bilat; no bruits. No lymphadenopathy or thryomegaly appreciated. Cor: PMI nondisplaced. Regular rate & rhythm. No rubs, gallops or murmurs. Lungs: clear Abdomen: soft, nontender, nondistended. No hepatosplenomegaly. No bruits  or masses. Good bowel sounds. Extremities: no cyanosis, clubbing, rash, edema Neuro: alert & orientedx3, cranial nerves grossly intact. moves all 4 extremities w/o difficulty. Affect pleasant    ASSESSMENT & PLAN:  1. Combined Systolic/Diastolic HF. ICM ECHO 12/2017 EF 30-35%-->04/2017 50-55%.  - NYHA II symptoms.  Volume status stable. Continue 20 mg torsemide daily and extra 20 mg if needed for 3 pound weight gain.    - No bb for now with ongoing fatigue.  - Continue valsartan 160 mg at bedtime.  - Continue spiro 12.5 mg daily.  - Not on entresto with allergy.   2. Recent AKI - Check BMET today  3. CAD S/P CABG 2011 most recent LHC 6/18 with no significant changes and no intervention - Continue statin.  - No ASA with BRBPR/gastric varicies - No s/s ischemia  4. 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 Denies bleeding.  5. NSVT - No BB for now. No change.   6. Paroxysmal Atrial fibrillation - Regular pulse - Not on AC due to cirrhosis with esophageal varicies  7. Hyperkalemia Check  BMET today.   EF Now 50-55%. Refer back to Dr Burt Knack for ongoing cardiology follow up. Follow HF clinic as needed.   Darrick Grinder, NP  11:41 AM

## 2017-08-02 NOTE — Patient Instructions (Addendum)
Routine lab work today. Will notify you of abnormal results, otherwise no news is good news!  Follow up with Dr. Burt Knack in 6 months. Address: [address] ([place]), [place], [place] [address]  Phone: [phone] Their office will call you to schedule.  Take all medication as prescribed the day of your appointment. Bring all medications with you to your appointment.  Do the following things EVERYDAY: 1) Weigh yourself in the morning before breakfast. Write it down and keep it in a log. 2) Take your medicines as prescribed 3) Eat low salt foods-Limit salt (sodium) to 2000 mg per day.  4) Stay as active as you can everyday 5) Limit all fluids for the day to less than 2 liters

## 2017-08-09 ENCOUNTER — Other Ambulatory Visit (HOSPITAL_COMMUNITY): Payer: Self-pay | Admitting: Cardiology

## 2017-08-14 DIAGNOSIS — M546 Pain in thoracic spine: Secondary | ICD-10-CM | POA: Diagnosis not present

## 2017-08-14 DIAGNOSIS — Z951 Presence of aortocoronary bypass graft: Secondary | ICD-10-CM | POA: Diagnosis not present

## 2017-08-14 DIAGNOSIS — M5489 Other dorsalgia: Secondary | ICD-10-CM | POA: Diagnosis not present

## 2017-08-14 DIAGNOSIS — S32000A Wedge compression fracture of unspecified lumbar vertebra, initial encounter for closed fracture: Secondary | ICD-10-CM | POA: Diagnosis not present

## 2017-08-14 DIAGNOSIS — F329 Major depressive disorder, single episode, unspecified: Secondary | ICD-10-CM | POA: Diagnosis not present

## 2017-08-14 DIAGNOSIS — S32010A Wedge compression fracture of first lumbar vertebra, initial encounter for closed fracture: Secondary | ICD-10-CM | POA: Diagnosis not present

## 2017-08-14 DIAGNOSIS — T148XXA Other injury of unspecified body region, initial encounter: Secondary | ICD-10-CM | POA: Diagnosis not present

## 2017-08-14 DIAGNOSIS — Z79899 Other long term (current) drug therapy: Secondary | ICD-10-CM | POA: Diagnosis not present

## 2017-08-14 DIAGNOSIS — W1839XA Other fall on same level, initial encounter: Secondary | ICD-10-CM | POA: Diagnosis not present

## 2017-08-14 DIAGNOSIS — I252 Old myocardial infarction: Secondary | ICD-10-CM | POA: Diagnosis not present

## 2017-08-14 DIAGNOSIS — Z7982 Long term (current) use of aspirin: Secondary | ICD-10-CM | POA: Diagnosis not present

## 2017-08-14 DIAGNOSIS — I509 Heart failure, unspecified: Secondary | ICD-10-CM | POA: Diagnosis not present

## 2017-08-14 DIAGNOSIS — S32019A Unspecified fracture of first lumbar vertebra, initial encounter for closed fracture: Secondary | ICD-10-CM | POA: Diagnosis not present

## 2017-08-14 DIAGNOSIS — K219 Gastro-esophageal reflux disease without esophagitis: Secondary | ICD-10-CM | POA: Diagnosis not present

## 2017-08-14 DIAGNOSIS — I110 Hypertensive heart disease with heart failure: Secondary | ICD-10-CM | POA: Diagnosis not present

## 2017-08-15 ENCOUNTER — Telehealth (HOSPITAL_COMMUNITY): Payer: Self-pay

## 2017-08-15 DIAGNOSIS — M5432 Sciatica, left side: Secondary | ICD-10-CM | POA: Diagnosis not present

## 2017-08-15 DIAGNOSIS — S32000A Wedge compression fracture of unspecified lumbar vertebra, initial encounter for closed fracture: Secondary | ICD-10-CM | POA: Diagnosis not present

## 2017-08-15 DIAGNOSIS — Z6837 Body mass index (BMI) 37.0-37.9, adult: Secondary | ICD-10-CM | POA: Diagnosis not present

## 2017-08-15 DIAGNOSIS — R202 Paresthesia of skin: Secondary | ICD-10-CM | POA: Diagnosis not present

## 2017-08-15 DIAGNOSIS — M5431 Sciatica, right side: Secondary | ICD-10-CM | POA: Diagnosis not present

## 2017-08-15 NOTE — Telephone Encounter (Signed)
Patients insurance is active and benefits verified through Medicare A/B - No co-pay, deductible amount of $185.00/$185.00 has been met, no out of pocket, 20% co-insurance, and no pre-authorization is required. Passport/reference [phone]

## 2017-08-17 ENCOUNTER — Telehealth (HOSPITAL_COMMUNITY): Payer: Self-pay

## 2017-08-17 DIAGNOSIS — R202 Paresthesia of skin: Secondary | ICD-10-CM | POA: Diagnosis not present

## 2017-08-17 DIAGNOSIS — M5432 Sciatica, left side: Secondary | ICD-10-CM | POA: Diagnosis not present

## 2017-08-17 DIAGNOSIS — S3992XA Unspecified injury of lower back, initial encounter: Secondary | ICD-10-CM | POA: Diagnosis not present

## 2017-08-17 DIAGNOSIS — M4856XA Collapsed vertebra, not elsewhere classified, lumbar region, initial encounter for fracture: Secondary | ICD-10-CM | POA: Diagnosis not present

## 2017-08-17 DIAGNOSIS — M5431 Sciatica, right side: Secondary | ICD-10-CM | POA: Diagnosis not present

## 2017-08-17 NOTE — Telephone Encounter (Signed)
Called pt to see if she would like to proceed with scheduling for CR, she stated not at this time. She is having back issues and once she goes to her doctor appointments for that, she is going on vacation for the rest of the summer.  Closed referral

## 2017-08-23 ENCOUNTER — Telehealth: Payer: Self-pay | Admitting: Cardiovascular Disease

## 2017-08-23 DIAGNOSIS — M4716 Other spondylosis with myelopathy, lumbar region: Secondary | ICD-10-CM | POA: Insufficient documentation

## 2017-08-23 DIAGNOSIS — S32010A Wedge compression fracture of first lumbar vertebra, initial encounter for closed fracture: Secondary | ICD-10-CM | POA: Insufficient documentation

## 2017-08-23 NOTE — Telephone Encounter (Signed)
NEW MESSAGE       [facility] Pre-operative Risk Assessment    Request for surgical clearance:  1. What type of surgery is being performed? KYPHOPLASTY  2. When is this surgery scheduled? TBD  3. What type of clearance is required (medical clearance vs. Pharmacy clearance to hold med vs. Both)? BOTH  4. Are there any medications that need to be held prior to surgery and how long  5. Practice name and name of physician performing surgery? DR MARK ROY  6. What is your office phone number [phone]   7.   What is your office fax number [phone]  8.   Anesthesia type (None, local, MAC, general) ? [place] 08/23/2017, 2:57 PM  _________________________________________________________________   (provider comments below)

## 2017-08-24 NOTE — Telephone Encounter (Signed)
Pt returned call and would like a call back about her pre- operative protocols please.

## 2017-08-24 NOTE — Telephone Encounter (Signed)
Call returned, note addended below to reflect that I updated Ms. Hurn to d/c ASA. Punam Broussard PA-C

## 2017-08-24 NOTE — Telephone Encounter (Signed)
   Primary Cardiologist: Sherren Mocha, MD  Chart reviewed as part of pre-operative protocol coverage. Patient was contacted 08/24/2017 in reference to pre-operative risk assessment for pending surgery as outlined below.  Samantha [name] was last seen on 6/5 by CHF team and felt to be doing well. CAD s/p CABG 2011 with stable LHC 07/2016, chronic combined CHF with improvement in LVEF, cirrhosis, varices (not on ASA due to this), AKI, PAF (no AC 2/2 cirrhosis), hyperkalemia. Since that day, Samantha [name] feels she has been doing very well from a cardiac standpoint. She has chronic deconditioning and dyspnea with higher levels of exertion but states this is unchanged. She has been able to complete >4 METS recently without angina (but fell recently, sustaining compression fracture, which has limited activity). Revised cardiac risk index is 6.6% indicating moderate risk of cardiac complication peri-operatively. I reviewed with Dr. Burt Knack. We do not feel there is any specific testing or intervention which could change her operative risk and therefore, based on ACC/AHA guidelines, the patient would be at acceptable risk for the planned procedure without further cardiovascular testing.   Of note, the patient did inform me she self-resumed aspirin even though our notes previously advised against it due to h/o cirrhosis/varices and BRBPR. She's not had any issues but I spoke with Dr. Burt Knack and he advised she stop it as risk outweighs benefit. She was informed of this recommendation and verbalized understanding. This should be taken into consideration when scheduling surgery if aspirin needed to be held prior to surgery.  I will route this recommendation to the requesting party via Epic fax function and remove from pre-op pool.  Please call with questions.  Charlie Pitter, PA-C 08/24/2017, 1:42 PM

## 2017-09-11 DIAGNOSIS — J984 Other disorders of lung: Secondary | ICD-10-CM | POA: Diagnosis not present

## 2017-09-14 DIAGNOSIS — Z955 Presence of coronary angioplasty implant and graft: Secondary | ICD-10-CM | POA: Diagnosis not present

## 2017-09-14 DIAGNOSIS — I2510 Atherosclerotic heart disease of native coronary artery without angina pectoris: Secondary | ICD-10-CM | POA: Diagnosis present

## 2017-09-14 DIAGNOSIS — K7581 Nonalcoholic steatohepatitis (NASH): Secondary | ICD-10-CM | POA: Diagnosis not present

## 2017-09-14 DIAGNOSIS — E785 Hyperlipidemia, unspecified: Secondary | ICD-10-CM | POA: Diagnosis present

## 2017-09-14 DIAGNOSIS — M8448XA Pathological fracture, other site, initial encounter for fracture: Secondary | ICD-10-CM | POA: Diagnosis not present

## 2017-09-14 DIAGNOSIS — Z880 Allergy status to penicillin: Secondary | ICD-10-CM | POA: Diagnosis not present

## 2017-09-14 DIAGNOSIS — S32010A Wedge compression fracture of first lumbar vertebra, initial encounter for closed fracture: Secondary | ICD-10-CM | POA: Diagnosis not present

## 2017-09-14 DIAGNOSIS — Z87891 Personal history of nicotine dependence: Secondary | ICD-10-CM | POA: Diagnosis not present

## 2017-09-14 DIAGNOSIS — I509 Heart failure, unspecified: Secondary | ICD-10-CM | POA: Diagnosis present

## 2017-09-14 DIAGNOSIS — E875 Hyperkalemia: Secondary | ICD-10-CM | POA: Diagnosis not present

## 2017-09-14 DIAGNOSIS — I959 Hypotension, unspecified: Secondary | ICD-10-CM | POA: Diagnosis not present

## 2017-09-14 DIAGNOSIS — I4891 Unspecified atrial fibrillation: Secondary | ICD-10-CM | POA: Diagnosis present

## 2017-09-14 DIAGNOSIS — K219 Gastro-esophageal reflux disease without esophagitis: Secondary | ICD-10-CM | POA: Diagnosis present

## 2017-09-14 DIAGNOSIS — M4856XA Collapsed vertebra, not elsewhere classified, lumbar region, initial encounter for fracture: Secondary | ICD-10-CM | POA: Diagnosis not present

## 2017-09-14 DIAGNOSIS — Z951 Presence of aortocoronary bypass graft: Secondary | ICD-10-CM | POA: Diagnosis not present

## 2017-09-14 DIAGNOSIS — I110 Hypertensive heart disease with heart failure: Secondary | ICD-10-CM | POA: Diagnosis present

## 2017-09-14 DIAGNOSIS — S32019A Unspecified fracture of first lumbar vertebra, initial encounter for closed fracture: Secondary | ICD-10-CM | POA: Diagnosis present

## 2017-09-18 DIAGNOSIS — I110 Hypertensive heart disease with heart failure: Secondary | ICD-10-CM | POA: Diagnosis not present

## 2017-09-18 DIAGNOSIS — E785 Hyperlipidemia, unspecified: Secondary | ICD-10-CM | POA: Diagnosis not present

## 2017-09-18 DIAGNOSIS — I4891 Unspecified atrial fibrillation: Secondary | ICD-10-CM | POA: Diagnosis not present

## 2017-09-18 DIAGNOSIS — Z9181 History of falling: Secondary | ICD-10-CM | POA: Diagnosis not present

## 2017-09-18 DIAGNOSIS — Z87891 Personal history of nicotine dependence: Secondary | ICD-10-CM | POA: Diagnosis not present

## 2017-09-18 DIAGNOSIS — Z951 Presence of aortocoronary bypass graft: Secondary | ICD-10-CM | POA: Diagnosis not present

## 2017-09-18 DIAGNOSIS — K219 Gastro-esophageal reflux disease without esophagitis: Secondary | ICD-10-CM | POA: Diagnosis not present

## 2017-09-18 DIAGNOSIS — S32010D Wedge compression fracture of first lumbar vertebra, subsequent encounter for fracture with routine healing: Secondary | ICD-10-CM | POA: Diagnosis not present

## 2017-09-18 DIAGNOSIS — I509 Heart failure, unspecified: Secondary | ICD-10-CM | POA: Diagnosis not present

## 2017-09-18 DIAGNOSIS — K7460 Unspecified cirrhosis of liver: Secondary | ICD-10-CM | POA: Diagnosis not present

## 2017-09-19 DIAGNOSIS — S32010D Wedge compression fracture of first lumbar vertebra, subsequent encounter for fracture with routine healing: Secondary | ICD-10-CM | POA: Diagnosis not present

## 2017-09-19 DIAGNOSIS — I509 Heart failure, unspecified: Secondary | ICD-10-CM | POA: Diagnosis not present

## 2017-09-19 DIAGNOSIS — I110 Hypertensive heart disease with heart failure: Secondary | ICD-10-CM | POA: Diagnosis not present

## 2017-09-19 DIAGNOSIS — I4891 Unspecified atrial fibrillation: Secondary | ICD-10-CM | POA: Diagnosis not present

## 2017-09-19 DIAGNOSIS — K219 Gastro-esophageal reflux disease without esophagitis: Secondary | ICD-10-CM | POA: Diagnosis not present

## 2017-09-19 DIAGNOSIS — K7460 Unspecified cirrhosis of liver: Secondary | ICD-10-CM | POA: Diagnosis not present

## 2017-09-20 DIAGNOSIS — I4891 Unspecified atrial fibrillation: Secondary | ICD-10-CM | POA: Diagnosis not present

## 2017-09-20 DIAGNOSIS — I110 Hypertensive heart disease with heart failure: Secondary | ICD-10-CM | POA: Diagnosis not present

## 2017-09-20 DIAGNOSIS — S32010D Wedge compression fracture of first lumbar vertebra, subsequent encounter for fracture with routine healing: Secondary | ICD-10-CM | POA: Diagnosis not present

## 2017-09-20 DIAGNOSIS — K7460 Unspecified cirrhosis of liver: Secondary | ICD-10-CM | POA: Diagnosis not present

## 2017-09-20 DIAGNOSIS — K219 Gastro-esophageal reflux disease without esophagitis: Secondary | ICD-10-CM | POA: Diagnosis not present

## 2017-09-20 DIAGNOSIS — I509 Heart failure, unspecified: Secondary | ICD-10-CM | POA: Diagnosis not present

## 2017-09-21 DIAGNOSIS — I110 Hypertensive heart disease with heart failure: Secondary | ICD-10-CM | POA: Diagnosis not present

## 2017-09-21 DIAGNOSIS — I509 Heart failure, unspecified: Secondary | ICD-10-CM | POA: Diagnosis not present

## 2017-09-21 DIAGNOSIS — K7460 Unspecified cirrhosis of liver: Secondary | ICD-10-CM | POA: Diagnosis not present

## 2017-09-21 DIAGNOSIS — S32010A Wedge compression fracture of first lumbar vertebra, initial encounter for closed fracture: Secondary | ICD-10-CM | POA: Diagnosis not present

## 2017-09-21 DIAGNOSIS — M8448XA Pathological fracture, other site, initial encounter for fracture: Secondary | ICD-10-CM | POA: Diagnosis not present

## 2017-09-21 DIAGNOSIS — Z9889 Other specified postprocedural states: Secondary | ICD-10-CM | POA: Diagnosis not present

## 2017-09-21 DIAGNOSIS — S32010D Wedge compression fracture of first lumbar vertebra, subsequent encounter for fracture with routine healing: Secondary | ICD-10-CM | POA: Diagnosis not present

## 2017-09-21 DIAGNOSIS — K219 Gastro-esophageal reflux disease without esophagitis: Secondary | ICD-10-CM | POA: Diagnosis not present

## 2017-09-21 DIAGNOSIS — I4891 Unspecified atrial fibrillation: Secondary | ICD-10-CM | POA: Diagnosis not present

## 2017-09-25 DIAGNOSIS — S32010D Wedge compression fracture of first lumbar vertebra, subsequent encounter for fracture with routine healing: Secondary | ICD-10-CM | POA: Diagnosis not present

## 2017-09-25 DIAGNOSIS — K219 Gastro-esophageal reflux disease without esophagitis: Secondary | ICD-10-CM | POA: Diagnosis not present

## 2017-09-25 DIAGNOSIS — I509 Heart failure, unspecified: Secondary | ICD-10-CM | POA: Diagnosis not present

## 2017-09-25 DIAGNOSIS — I110 Hypertensive heart disease with heart failure: Secondary | ICD-10-CM | POA: Diagnosis not present

## 2017-09-25 DIAGNOSIS — K7460 Unspecified cirrhosis of liver: Secondary | ICD-10-CM | POA: Diagnosis not present

## 2017-09-25 DIAGNOSIS — I4891 Unspecified atrial fibrillation: Secondary | ICD-10-CM | POA: Diagnosis not present

## 2017-09-28 DIAGNOSIS — I4891 Unspecified atrial fibrillation: Secondary | ICD-10-CM | POA: Diagnosis not present

## 2017-09-28 DIAGNOSIS — S32010D Wedge compression fracture of first lumbar vertebra, subsequent encounter for fracture with routine healing: Secondary | ICD-10-CM | POA: Diagnosis not present

## 2017-09-28 DIAGNOSIS — I509 Heart failure, unspecified: Secondary | ICD-10-CM | POA: Diagnosis not present

## 2017-09-28 DIAGNOSIS — K219 Gastro-esophageal reflux disease without esophagitis: Secondary | ICD-10-CM | POA: Diagnosis not present

## 2017-09-28 DIAGNOSIS — I110 Hypertensive heart disease with heart failure: Secondary | ICD-10-CM | POA: Diagnosis not present

## 2017-09-28 DIAGNOSIS — K7460 Unspecified cirrhosis of liver: Secondary | ICD-10-CM | POA: Diagnosis not present

## 2017-10-02 DIAGNOSIS — I509 Heart failure, unspecified: Secondary | ICD-10-CM | POA: Diagnosis not present

## 2017-10-02 DIAGNOSIS — I4891 Unspecified atrial fibrillation: Secondary | ICD-10-CM | POA: Diagnosis not present

## 2017-10-02 DIAGNOSIS — K219 Gastro-esophageal reflux disease without esophagitis: Secondary | ICD-10-CM | POA: Diagnosis not present

## 2017-10-02 DIAGNOSIS — K7460 Unspecified cirrhosis of liver: Secondary | ICD-10-CM | POA: Diagnosis not present

## 2017-10-02 DIAGNOSIS — I110 Hypertensive heart disease with heart failure: Secondary | ICD-10-CM | POA: Diagnosis not present

## 2017-10-02 DIAGNOSIS — S32010D Wedge compression fracture of first lumbar vertebra, subsequent encounter for fracture with routine healing: Secondary | ICD-10-CM | POA: Diagnosis not present

## 2017-10-05 DIAGNOSIS — I4891 Unspecified atrial fibrillation: Secondary | ICD-10-CM | POA: Diagnosis not present

## 2017-10-05 DIAGNOSIS — S32010D Wedge compression fracture of first lumbar vertebra, subsequent encounter for fracture with routine healing: Secondary | ICD-10-CM | POA: Diagnosis not present

## 2017-10-05 DIAGNOSIS — I509 Heart failure, unspecified: Secondary | ICD-10-CM | POA: Diagnosis not present

## 2017-10-05 DIAGNOSIS — K219 Gastro-esophageal reflux disease without esophagitis: Secondary | ICD-10-CM | POA: Diagnosis not present

## 2017-10-05 DIAGNOSIS — K7460 Unspecified cirrhosis of liver: Secondary | ICD-10-CM | POA: Diagnosis not present

## 2017-10-05 DIAGNOSIS — I110 Hypertensive heart disease with heart failure: Secondary | ICD-10-CM | POA: Diagnosis not present

## 2017-10-06 DIAGNOSIS — I4891 Unspecified atrial fibrillation: Secondary | ICD-10-CM | POA: Diagnosis not present

## 2017-10-06 DIAGNOSIS — S32010D Wedge compression fracture of first lumbar vertebra, subsequent encounter for fracture with routine healing: Secondary | ICD-10-CM | POA: Diagnosis not present

## 2017-10-06 DIAGNOSIS — K219 Gastro-esophageal reflux disease without esophagitis: Secondary | ICD-10-CM | POA: Diagnosis not present

## 2017-10-06 DIAGNOSIS — K7460 Unspecified cirrhosis of liver: Secondary | ICD-10-CM | POA: Diagnosis not present

## 2017-10-06 DIAGNOSIS — I509 Heart failure, unspecified: Secondary | ICD-10-CM | POA: Diagnosis not present

## 2017-10-06 DIAGNOSIS — I110 Hypertensive heart disease with heart failure: Secondary | ICD-10-CM | POA: Diagnosis not present

## 2017-10-12 DIAGNOSIS — K7460 Unspecified cirrhosis of liver: Secondary | ICD-10-CM | POA: Diagnosis not present

## 2017-10-12 DIAGNOSIS — I509 Heart failure, unspecified: Secondary | ICD-10-CM | POA: Diagnosis not present

## 2017-10-12 DIAGNOSIS — I4891 Unspecified atrial fibrillation: Secondary | ICD-10-CM | POA: Diagnosis not present

## 2017-10-12 DIAGNOSIS — K219 Gastro-esophageal reflux disease without esophagitis: Secondary | ICD-10-CM | POA: Diagnosis not present

## 2017-10-12 DIAGNOSIS — I110 Hypertensive heart disease with heart failure: Secondary | ICD-10-CM | POA: Diagnosis not present

## 2017-10-12 DIAGNOSIS — S32010D Wedge compression fracture of first lumbar vertebra, subsequent encounter for fracture with routine healing: Secondary | ICD-10-CM | POA: Diagnosis not present

## 2017-10-18 DIAGNOSIS — I509 Heart failure, unspecified: Secondary | ICD-10-CM | POA: Diagnosis not present

## 2017-10-18 DIAGNOSIS — I110 Hypertensive heart disease with heart failure: Secondary | ICD-10-CM | POA: Diagnosis not present

## 2017-10-18 DIAGNOSIS — I4891 Unspecified atrial fibrillation: Secondary | ICD-10-CM | POA: Diagnosis not present

## 2017-10-18 DIAGNOSIS — K7460 Unspecified cirrhosis of liver: Secondary | ICD-10-CM | POA: Diagnosis not present

## 2017-10-18 DIAGNOSIS — K219 Gastro-esophageal reflux disease without esophagitis: Secondary | ICD-10-CM | POA: Diagnosis not present

## 2017-10-18 DIAGNOSIS — S32010D Wedge compression fracture of first lumbar vertebra, subsequent encounter for fracture with routine healing: Secondary | ICD-10-CM | POA: Diagnosis not present

## 2017-10-24 ENCOUNTER — Encounter (INDEPENDENT_AMBULATORY_CARE_PROVIDER_SITE_OTHER): Payer: Self-pay | Admitting: *Deleted

## 2017-10-24 ENCOUNTER — Encounter (INDEPENDENT_AMBULATORY_CARE_PROVIDER_SITE_OTHER): Payer: Self-pay | Admitting: Internal Medicine

## 2017-10-24 ENCOUNTER — Ambulatory Visit (INDEPENDENT_AMBULATORY_CARE_PROVIDER_SITE_OTHER): Payer: Medicare Other | Admitting: Internal Medicine

## 2017-10-24 VITALS — BP 114/72 | HR 68 | Temp 98.2°F | Resp 18 | Ht 63.0 in | Wt 203.5 lb

## 2017-10-24 DIAGNOSIS — K7460 Unspecified cirrhosis of liver: Secondary | ICD-10-CM

## 2017-10-24 DIAGNOSIS — D696 Thrombocytopenia, unspecified: Secondary | ICD-10-CM

## 2017-10-24 DIAGNOSIS — I255 Ischemic cardiomyopathy: Secondary | ICD-10-CM | POA: Diagnosis not present

## 2017-10-24 NOTE — Progress Notes (Signed)
Presenting complaint;  Follow-up for chronic liver disease.  Subjective:  Patient is 67 year old Caucasian female who has cirrhosis secondary to NAFLD who is here for scheduled visit.  She was last seen 6 months ago.  She has lost 8 pounds since her last visit.  In the last 2 years she has lost 18 pounds.  Patient states she fell and sustained compression fracture to 1 of her vertebrae and underwent vertebroplasty about 4 weeks ago.  She required platelet transfusion prior to that procedure.  She is back to walking daily.  She denies nausea vomiting heartburn or dysphagia.  Her bowels move daily as long as she takes polyethylene glycol.  She denies melena or rectal bleeding.  She says her appetite is good.  Pain medication was changed to eliminate acetaminophen.  Patient also complains of numbness to 3 fingers in her right hand. She also wonders why she has falling episodes.  Patient advised to discuss this issue with Dr. Quintin Alto.   Current Medications: Outpatient Encounter Medications as of 10/24/2017  Medication Sig  . albuterol (PROVENTIL HFA;VENTOLIN HFA) 108 (90 BASE) MCG/ACT inhaler Inhale 2 puffs into the lungs every 6 (six) hours as needed for wheezing or shortness of breath.   [name] atorvastatin (LIPITOR) 20 MG tablet Take 20 mg by mouth every evening.   . calcium carbonate (TUMS - DOSED IN MG ELEMENTAL CALCIUM) 500 MG chewable tablet Chew 1 tablet by mouth daily as needed for indigestion or heartburn.  . cetirizine (ZYRTEC) 10 MG tablet Take 10 mg by mouth daily as needed for allergies (during allergy season).   . ezetimibe (ZETIA) 10 MG tablet Take 10 mg by mouth at bedtime.   [name] FLUoxetine (PROZAC) 40 MG capsule Take 40 mg by mouth daily.   . fluticasone (FLONASE) 50 MCG/ACT nasal spray Place 1 spray into both nostrils daily as needed for allergies.   . isosorbide mononitrate (IMDUR) 60 MG 24 hr tablet TAKE ONE TABLET BY MOUTH ONCE DAILY  . NITROSTAT 0.4 MG SL tablet Place 0.4 mg under the  tongue every 5 (five) minutes as needed for chest pain (x 3 tabs daily).   . Oxycodone HCl 10 MG TABS Take 10 mg by mouth every 4 (four) hours as needed.   . pantoprazole (PROTONIX) 40 MG tablet Take 1 tablet (40 mg total) by mouth 2 (two) times daily. (Patient taking differently: Take 40 mg by mouth daily. )  . polyethylene glycol (MIRALAX / GLYCOLAX) packet Take 17 g by mouth daily.  . ranitidine (ZANTAC) 150 MG tablet Take 150 mg by mouth at bedtime.  . torsemide (DEMADEX) 20 MG tablet Take 1 tablet (20 mg total) by mouth daily.  . valsartan (DIOVAN) 160 MG tablet Take 1 tablet (160 mg total) by mouth at bedtime.  . [DISCONTINUED] HYDROcodone-acetaminophen (NORCO) 10-325 MG tablet Take 1 tablet by mouth every 6 (six) hours as needed (for pain.).    No facility-administered encounter medications on file as of 10/24/2017.      Objective: Blood pressure 114/72, pulse 68, temperature 98.2 F (36.8 C), temperature source Oral, resp. rate 18, height 5' 3"  (1.6 m), weight 203 lb 8 oz (92.3 kg), last menstrual period 03/29/2011. Patient is alert and in no acute distress. She does not have asterixis. Conjunctiva is pink. Sclera is nonicteric Oropharyngeal mucosa is normal. No neck masses or thyromegaly noted. Cardiac exam with regular rhythm normal S1 and S2.  She has faint systolic ejection murmur best heard at upper left sternal border. Lungs  are clear to auscultation. Abdomen is full.  Spleen tip is easily palpable.  Liver edge is indistinct.  Shifting dullness is absent. No LE edema or clubbing noted.  Labs/studies Results:  LFTs from 07/21/2017 Bilirubin 0.4. AP 87 AST 61 and ALT 22 Serum albumin 4.0.  Assessment:  #1.  Cirrhosis secondary to NAFLD complicated by esophageal varices which have been banded previously.  EGD last year revealed small esophageal varices and new finding of fundal varices.  She is due for [facility] screening.  If she continues to lose weight hepatic function and  portal hypertension may improve.  #2.  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  Plan:  With abdominal ultrasound for [place] screening and also to assess for portal vein patency. Will request recent blood work from [facility] before any tests ordered. Patient encouraged to maintain physical activity in order to lose more weight. Office visit in 6 months.

## 2017-10-24 NOTE — Patient Instructions (Signed)
Abdominal ultrasound to be scheduled. Will request recent blood work for any tests ordered.

## 2017-10-27 DIAGNOSIS — S32010A Wedge compression fracture of first lumbar vertebra, initial encounter for closed fracture: Secondary | ICD-10-CM | POA: Diagnosis not present

## 2017-10-27 DIAGNOSIS — S32010D Wedge compression fracture of first lumbar vertebra, subsequent encounter for fracture with routine healing: Secondary | ICD-10-CM | POA: Diagnosis not present

## 2017-10-27 DIAGNOSIS — Z09 Encounter for follow-up examination after completed treatment for conditions other than malignant neoplasm: Secondary | ICD-10-CM | POA: Diagnosis not present

## 2017-10-27 DIAGNOSIS — K219 Gastro-esophageal reflux disease without esophagitis: Secondary | ICD-10-CM | POA: Diagnosis not present

## 2017-10-27 DIAGNOSIS — I4891 Unspecified atrial fibrillation: Secondary | ICD-10-CM | POA: Diagnosis not present

## 2017-10-27 DIAGNOSIS — I110 Hypertensive heart disease with heart failure: Secondary | ICD-10-CM | POA: Diagnosis not present

## 2017-10-27 DIAGNOSIS — K7460 Unspecified cirrhosis of liver: Secondary | ICD-10-CM | POA: Diagnosis not present

## 2017-10-27 DIAGNOSIS — I509 Heart failure, unspecified: Secondary | ICD-10-CM | POA: Diagnosis not present

## 2017-11-01 ENCOUNTER — Ambulatory Visit (HOSPITAL_COMMUNITY)
Admission: RE | Admit: 2017-11-01 | Discharge: 2017-11-01 | Disposition: A | Discharge status: Home / Self Care | Payer: Medicare Other | Source: Ambulatory Visit | Attending: Internal Medicine | Admitting: Internal Medicine

## 2017-11-01 DIAGNOSIS — I509 Heart failure, unspecified: Secondary | ICD-10-CM | POA: Diagnosis not present

## 2017-11-01 DIAGNOSIS — I110 Hypertensive heart disease with heart failure: Secondary | ICD-10-CM | POA: Diagnosis not present

## 2017-11-01 DIAGNOSIS — K7460 Unspecified cirrhosis of liver: Secondary | ICD-10-CM | POA: Diagnosis not present

## 2017-11-01 DIAGNOSIS — R161 Splenomegaly, not elsewhere classified: Secondary | ICD-10-CM | POA: Diagnosis not present

## 2017-11-01 DIAGNOSIS — R932 Abnormal findings on diagnostic imaging of liver and biliary tract: Secondary | ICD-10-CM | POA: Diagnosis not present

## 2017-11-01 DIAGNOSIS — K219 Gastro-esophageal reflux disease without esophagitis: Secondary | ICD-10-CM | POA: Diagnosis not present

## 2017-11-01 DIAGNOSIS — I4891 Unspecified atrial fibrillation: Secondary | ICD-10-CM | POA: Diagnosis not present

## 2017-11-01 DIAGNOSIS — S32010D Wedge compression fracture of first lumbar vertebra, subsequent encounter for fracture with routine healing: Secondary | ICD-10-CM | POA: Diagnosis not present

## 2017-11-07 DIAGNOSIS — I110 Hypertensive heart disease with heart failure: Secondary | ICD-10-CM | POA: Diagnosis not present

## 2017-11-07 DIAGNOSIS — K219 Gastro-esophageal reflux disease without esophagitis: Secondary | ICD-10-CM | POA: Diagnosis not present

## 2017-11-07 DIAGNOSIS — K7460 Unspecified cirrhosis of liver: Secondary | ICD-10-CM | POA: Diagnosis not present

## 2017-11-07 DIAGNOSIS — S32010D Wedge compression fracture of first lumbar vertebra, subsequent encounter for fracture with routine healing: Secondary | ICD-10-CM | POA: Diagnosis not present

## 2017-11-07 DIAGNOSIS — I4891 Unspecified atrial fibrillation: Secondary | ICD-10-CM | POA: Diagnosis not present

## 2017-11-07 DIAGNOSIS — I509 Heart failure, unspecified: Secondary | ICD-10-CM | POA: Diagnosis not present

## 2017-11-15 ENCOUNTER — Encounter: Payer: Self-pay | Admitting: Cardiovascular Disease

## 2017-11-15 ENCOUNTER — Ambulatory Visit (HOSPITAL_COMMUNITY): Payer: Medicare Other | Attending: Internal Medicine

## 2017-11-15 ENCOUNTER — Ambulatory Visit (INDEPENDENT_AMBULATORY_CARE_PROVIDER_SITE_OTHER): Payer: Medicare Other | Admitting: Cardiovascular Disease

## 2017-11-15 ENCOUNTER — Other Ambulatory Visit: Payer: Self-pay

## 2017-11-15 VITALS — BP 104/60 | HR 94 | Ht 63.0 in | Wt 203.4 lb

## 2017-11-15 DIAGNOSIS — I5042 Chronic combined systolic (congestive) and diastolic (congestive) heart failure: Secondary | ICD-10-CM

## 2017-11-15 DIAGNOSIS — I255 Ischemic cardiomyopathy: Secondary | ICD-10-CM | POA: Diagnosis not present

## 2017-11-15 DIAGNOSIS — E669 Obesity, unspecified: Secondary | ICD-10-CM | POA: Diagnosis not present

## 2017-11-15 DIAGNOSIS — Z6836 Body mass index (BMI) 36.0-36.9, adult: Secondary | ICD-10-CM | POA: Insufficient documentation

## 2017-11-15 DIAGNOSIS — I481 Persistent atrial fibrillation: Secondary | ICD-10-CM

## 2017-11-15 DIAGNOSIS — I110 Hypertensive heart disease with heart failure: Secondary | ICD-10-CM | POA: Insufficient documentation

## 2017-11-15 DIAGNOSIS — E785 Hyperlipidemia, unspecified: Secondary | ICD-10-CM | POA: Insufficient documentation

## 2017-11-15 DIAGNOSIS — I4819 Other persistent atrial fibrillation: Secondary | ICD-10-CM

## 2017-11-15 DIAGNOSIS — Z8249 Family history of ischemic heart disease and other diseases of the circulatory system: Secondary | ICD-10-CM | POA: Diagnosis not present

## 2017-11-15 DIAGNOSIS — I509 Heart failure, unspecified: Secondary | ICD-10-CM | POA: Diagnosis not present

## 2017-11-15 DIAGNOSIS — I2510 Atherosclerotic heart disease of native coronary artery without angina pectoris: Secondary | ICD-10-CM | POA: Insufficient documentation

## 2017-11-15 DIAGNOSIS — S32010D Wedge compression fracture of first lumbar vertebra, subsequent encounter for fracture with routine healing: Secondary | ICD-10-CM | POA: Diagnosis not present

## 2017-11-15 DIAGNOSIS — Z951 Presence of aortocoronary bypass graft: Secondary | ICD-10-CM | POA: Diagnosis not present

## 2017-11-15 DIAGNOSIS — I25119 Atherosclerotic heart disease of native coronary artery with unspecified angina pectoris: Secondary | ICD-10-CM | POA: Diagnosis not present

## 2017-11-15 DIAGNOSIS — K219 Gastro-esophageal reflux disease without esophagitis: Secondary | ICD-10-CM | POA: Diagnosis not present

## 2017-11-15 DIAGNOSIS — Z87891 Personal history of nicotine dependence: Secondary | ICD-10-CM | POA: Diagnosis not present

## 2017-11-15 DIAGNOSIS — I4891 Unspecified atrial fibrillation: Secondary | ICD-10-CM | POA: Diagnosis not present

## 2017-11-15 DIAGNOSIS — K7460 Unspecified cirrhosis of liver: Secondary | ICD-10-CM | POA: Insufficient documentation

## 2017-11-15 DIAGNOSIS — I5022 Chronic systolic (congestive) heart failure: Secondary | ICD-10-CM | POA: Insufficient documentation

## 2017-11-15 MED ORDER — ISOSORBIDE MONONITRATE ER 60 MG PO TB24: 90.0000 mg | ORAL_TABLET | Freq: Every day | ORAL | 3 refills | Status: DC

## 2017-11-15 MED ORDER — TORSEMIDE 20 MG PO TABS: 40.0000 mg | ORAL_TABLET | Freq: Every day | ORAL | 3 refills | Status: DC

## 2017-11-15 NOTE — Patient Instructions (Addendum)
Medication Instructions:  1) INCREASE TORSEMIDE to 40 mg daily 2) INCREASE ISOSORBIDE to 90 mg daily 3) START TOPROL (metoprolol succinate) 12.5 mg daily (you will have to cut a 25 mg tablet in half)  Labwork: None  Testing/Procedures: None  Follow-Up: You have an appointment with Richardson Dopp, PA on February 14, 2018 at 9:45AM.  Any Other Special Instructions Will Be Listed Below (If Applicable).     If you need a refill on your cardiac medications before your next appointment, please call your pharmacy.

## 2017-11-15 NOTE — Progress Notes (Signed)
Cardiology Office Note:    Date:  11/16/2017   ID:  Samantha [name], DOB [birth year], MRN [id number]  PCP:  [name], MD  Cardiologist:  Sherren Mocha, MD  Electrophysiologist:  None   Referring MD: [name], MD   Chief Complaint  Patient presents with  . Shortness of Breath   History of Present Illness:    Samantha [name] is a 67 y.o. female with a hx of coronary artery disease with multivessel CABG in 2011.  She has also undergone PCI procedures following her bypass surgery.  She is been followed for chronic systolic heart failure and persistent atrial fibrillation.  She is not on anticoagulation because of nonalcoholic cirrhosis with gastric varices.  Other comorbid conditions include type 2 diabetes, hypertension, obesity, and hyperlipidemia.  She was last seen in the advanced heart failure clinic in June 2019.  The patient's LV function had improved and she was released back to follow-up with me.  The patient is here alone today.  She is having more problems with chest discomfort and shortness of breath.  She is gained weight and has just started taking to torsemide daily to try to help with fluid.  She complains of fatigue, leg swelling.  Denies orthopnea or PND.  No recent bleeding problems.  She continues to undergo close surveillance of her varices.  She is off of all antiplatelet therapy.  She is describing more frequent nocturnal chest discomfort, responsive to sublingual nitroglycerin.  Past Medical History:  Diagnosis Date  . Chronic systolic CHF (congestive heart failure) ([place])   . Cirrhosis of liver ([place])   . Coronary artery disease    a. s/p CABG 2001 w/ (LIMA-OM, SVG-D1, SVG-RCA). b. h/o multiple PCIs per Dr. Antionette Char note.  . Depression   . Esophageal varices ([place])    New 2013  . Gastroesophageal reflux disease   . History of pneumonia   . Hyperlipidemia   . Hypertension   . Obesity   . Osteoarthritis   . Paroxysmal atrial flutter ([place])    a. dx  05/2016.  [name] Persistent atrial fibrillation ([place])    a. reported in hosp 07/2016, not on anticoag due to cirrhosis and liver disease, low platelets, varices.  . Thrombocytopenia ([place])     Past Surgical History:  Procedure Laterality Date  . ABDOMINAL HYSTERECTOMY    . BACK SURGERY    . CARDIAC CATHETERIZATION  2004   left internal mammary artery to the obtuse marginal  was found to be small and thread like.  The two grafts were patent.  The left circumflex had 90% in-stent restenosis and cutting balloon angioplasty was performed followed by placement of a 3.0 x 57m Taxus drug -eluting stent.    .Marland KitchenCARDIAC CATHETERIZATION  2006   There was in-stent restenosis in the left circumflex and this was treated with cutting balloon angioplasty   . CARDIAC CATHETERIZATION  2008   vein graft to the to the obtuse marginal was patent, although small, left circumflex had 40% in-stent restenosis, ejection fraction 40-45%.  The patient was medically mananged.  .Marland KitchenCARDIAC CATHETERIZATION N/A 01/09/2015   Procedure: Left Heart Cath and Cors/Grafts Angiography;  Surgeon: HBelva Crome MD;  [facility];  Service: Cardiovascular;  Laterality: N/A;  . COLONOSCOPY  03/29/2011   Procedure: COLONOSCOPY;  Surgeon: MJamesetta So MD;  [facility];  Service: Gastroenterology;  Laterality: N/A;  . CORONARY ARTERY BYPASS GRAFT  May 31,2001   x  3 with a vein graft to the first diagonal, vein graft to the right coronary  artery, and a free left internal mammary  artery to the obtuse marginal   . ESOPHAGEAL BANDING N/A 07/04/2012   Procedure: ESOPHAGEAL BANDING;  Surgeon: Rogene Houston, MD;  [facility];  Service: Endoscopy;  Laterality: N/A;  . ESOPHAGEAL BANDING N/A 09/17/2012   Procedure: ESOPHAGEAL BANDING;  Surgeon: Rogene Houston, MD;  [facility];  Service: Endoscopy;  Laterality: N/A;  . ESOPHAGEAL BANDING N/A 10/22/2013   Procedure: ESOPHAGEAL BANDING;  Surgeon:  Rogene Houston, MD;  [facility];  Service: Endoscopy;  Laterality: N/A;  . ESOPHAGEAL BANDING N/A 11/28/2014   Procedure: ESOPHAGEAL BANDING;  Surgeon: Rogene Houston, MD;  [facility];  Service: Endoscopy;  Laterality: N/A;  . ESOPHAGEAL BANDING N/A 10/29/2015   Procedure: ESOPHAGEAL BANDING;  Surgeon: Rogene Houston, MD;  [facility];  Service: Endoscopy;  Laterality: N/A;  . ESOPHAGOGASTRODUODENOSCOPY  12/20/2011   Procedure: ESOPHAGOGASTRODUODENOSCOPY (EGD);  Surgeon: Jamesetta So, MD;  [facility];  Service: Gastroenterology;  Laterality: N/A;  . ESOPHAGOGASTRODUODENOSCOPY N/A 07/04/2012   Procedure: ESOPHAGOGASTRODUODENOSCOPY (EGD);  Surgeon: Rogene Houston, MD;  [facility];  Service: Endoscopy;  Laterality: N/A;  235-moved to 255 Ann to notify pt  . ESOPHAGOGASTRODUODENOSCOPY N/A 09/17/2012   Procedure: ESOPHAGOGASTRODUODENOSCOPY (EGD);  Surgeon: Rogene Houston, MD;  [facility];  Service: Endoscopy;  Laterality: N/A;  730  . ESOPHAGOGASTRODUODENOSCOPY N/A 10/22/2013   Procedure: ESOPHAGOGASTRODUODENOSCOPY (EGD);  Surgeon: Rogene Houston, MD;  [facility];  Service: Endoscopy;  Laterality: N/A;  730  . ESOPHAGOGASTRODUODENOSCOPY N/A 11/28/2014   Procedure: ESOPHAGOGASTRODUODENOSCOPY (EGD);  Surgeon: Rogene Houston, MD;  [facility];  Service: Endoscopy;  Laterality: N/A;  1:25  . ESOPHAGOGASTRODUODENOSCOPY N/A 10/29/2015   Procedure: ESOPHAGOGASTRODUODENOSCOPY (EGD);  Surgeon: Rogene Houston, MD;  [facility];  Service: Endoscopy;  Laterality: N/A;  12:00  . ESOPHAGOGASTRODUODENOSCOPY N/A 12/12/2016   Procedure: ESOPHAGOGASTRODUODENOSCOPY (EGD);  Surgeon: Rogene Houston, MD;  [facility];  Service: Endoscopy;  Laterality: N/A;  225  . FLEXIBLE SIGMOIDOSCOPY N/A 03/20/2017   Procedure: FLEXIBLE SIGMOIDOSCOPY;  Surgeon: Rogene Houston, MD;  [facility];   Service: Endoscopy;  Laterality: N/A;  7:30  . JOINT REPLACEMENT    . LEFT HEART CATH AND CORS/GRAFTS ANGIOGRAPHY N/A 08/15/2016   Procedure: Left Heart Cath and Cors/Grafts Angiography;  Surgeon: Jettie Booze, MD;  Location: [facility];  Service: Cardiovascular;  Laterality: N/A;  . LEFT HEART CATHETERIZATION WITH CORONARY/GRAFT ANGIOGRAM N/A 12/25/2013   Procedure: LEFT HEART CATHETERIZATION WITH Beatrix Fetters;  Surgeon: Blane Ohara, MD;  Location: [facility];  Service: Cardiovascular;  Laterality: N/A;  . RIGHT HEART CATH N/A 05/29/2017   Procedure: RIGHT HEART CATH;  Surgeon: Jolaine Artist, MD;  Location: [facility];  Service: Cardiovascular;  Laterality: N/A;  . rotator cuff left  2007  . TONSILLECTOMY      Current Medications: Current Meds  Medication Sig  . albuterol (PROVENTIL HFA;VENTOLIN HFA) 108 (90 BASE) MCG/ACT inhaler Inhale 2 puffs into the lungs every 6 (six) hours as needed for wheezing or shortness of breath.   [name] atorvastatin (LIPITOR) 20 MG tablet Take 20 mg by mouth every evening.   . calcium carbonate (TUMS - DOSED IN MG ELEMENTAL CALCIUM) 500 MG chewable  tablet Chew 1 tablet by mouth daily as needed for indigestion or heartburn.  . cetirizine (ZYRTEC) 10 MG tablet Take 10 mg by mouth daily as needed for allergies (during allergy season).   . ezetimibe (ZETIA) 10 MG tablet Take 10 mg by mouth at bedtime.   [name] FLUoxetine (PROZAC) 40 MG capsule Take 40 mg by mouth daily.   . fluticasone (FLONASE) 50 MCG/ACT nasal spray Place 1 spray into both nostrils daily as needed for allergies.   [name] HYDROcodone-acetaminophen (NORCO) 10-325 MG tablet Take 1 tablet by mouth as needed for pain.  . isosorbide mononitrate (IMDUR) 60 MG 24 hr tablet Take 1.5 tablets (90 mg total) by mouth daily.  [name] NITROSTAT 0.4 MG SL tablet Place 0.4 mg under the tongue every 5 (five) minutes as needed for chest pain (x 3 tabs daily).   . pantoprazole (PROTONIX) 40  MG tablet Take 1 tablet (40 mg total) by mouth 2 (two) times daily. (Patient taking differently: Take 40 mg by mouth daily. )  . polyethylene glycol (MIRALAX / GLYCOLAX) packet Take 17 g by mouth daily.  . ranitidine (ZANTAC) 150 MG tablet Take 150 mg by mouth at bedtime.  . torsemide (DEMADEX) 20 MG tablet Take 2 tablets (40 mg total) by mouth daily.  . valsartan (DIOVAN) 160 MG tablet Take 1 tablet (160 mg total) by mouth at bedtime.  . [DISCONTINUED] isosorbide mononitrate (IMDUR) 60 MG 24 hr tablet TAKE ONE TABLET BY MOUTH ONCE DAILY  . [DISCONTINUED] Oxycodone HCl 10 MG TABS Take 10 mg by mouth every 4 (four) hours as needed.   . [DISCONTINUED] torsemide (DEMADEX) 20 MG tablet Take 1 tablet (20 mg total) by mouth daily.     Allergies:   Entresto [sacubitril-valsartan]; Acetaminophen; Oxycodone; Ace inhibitors; Cefaclor; Cephalexin; Penicillins; Pregabalin; and Tape   Social History   Socioeconomic History  . Marital status: Divorced    Spouse name: Not on file  . Number of children: Not on file  . Years of education: Not on file  . Highest education level: Not on file  Occupational History  . Occupation: Disabled  Social Needs  . Financial resource strain: Not on file  . Food insecurity:    Worry: Not on file    Inability: Not on file  . Transportation needs:    Medical: Not on file    Non-medical: Not on file  Tobacco Use  . Smoking status: Former Smoker    Packs/day: 0.50    Years: 20.00    Pack years: 10.00    Types: Cigarettes    Last attempt to quit: 07/21/1995    Years since quitting: 22.3  . Smokeless tobacco: Never Used  Substance and Sexual Activity  . Alcohol use: No  . Drug use: No  . Sexual activity: Not on file  Lifestyle  . Physical activity:    Days per week: Not on file    Minutes per session: Not on file  . Stress: Not on file  Relationships  . Social connections:    Talks on phone: Not on file    Gets together: Not on file    Attends religious  service: Not on file    Active member of club or organization: Not on file    Attends meetings of clubs or organizations: Not on file    Relationship status: Not on file  Other Topics Concern  . Not on file  Social History Narrative   Lives in [place] by herself.  Family History: The patient's family history includes Coronary artery disease in her sister; Heart attack in her mother; Heart attack (age of onset: 23) in her father. There is no history of Colon cancer.  ROS:   Please see the history of present illness.    Positive for diarrhea, depression, back pain, muscle pain, irregular heartbeats, anxiety, balance problems.  All other systems reviewed and are negative.  EKGs/Labs/Other Studies Reviewed:    The following studies were reviewed today: Right heart catheterization 05/29/2017: Findings:  RA = 11 RV = 51/12 PA = 51/13 (32) PCW = 18 (v = 25-30) Fick cardiac output/index = 9.7/5.0 Thermo CO/CI = 7.6/3.9 PVR = 1.8 WU FA sat = 96% PA sat = 73%, 76% SVC sat = 67%  Assessment: 1. Mild to moderately elevated R-sided pressures 2. Minimally elevated wedge pressure with prominent v-waves likely due to diastolic dysfunction 3. High-cardiac output likely due to cirrhotic physiology  4. No evidence of significant intra-cardiac shunting  Plan/Discussion:  Switch to torsemide. Possible d/c in am.  Echo 05-25-2017: Study Conclusions  - Left ventricle: The cavity size was normal. Wall thickness was   increased in a pattern of mild LVH. Systolic function was normal.   The estimated ejection fraction was in the range of 50% to 55%.   Images were inadequate for LV wall motion assessment. The study   is not technically sufficient to allow evaluation of LV diastolic   function. - Mitral valve: Calcified annulus. Mildly thickened leaflets .   There was mild regurgitation. - Left atrium: Moderately dilated. - Right ventricle: The cavity size was mildly dilated. Mildly    decreased systolic function. - Right atrium: Moderately dilated. - Atrial septum: No defect or patent foramen ovale was identified. - Tricuspid valve: There was moderate regurgitation. - Pulmonic valve: Peak gradient (S): 27 mm Hg. - Pulmonary arteries: PA peak pressure: 54 mm Hg (S). - Inferior vena cava: The vessel was dilated. The respirophasic   diameter changes were blunted (< 50%), consistent with elevated   central venous pressure.  Impressions:  - Compared to a prior study in 12/2016, the LVEF has improved to   50-55%.  Echo (today): Study Conclusions  - Left ventricle: The cavity size was normal. Wall thickness was   normal. Systolic function was mildly to moderately reduced. The   estimated ejection fraction was in the range of 40% to 45%.   Inferior and lateral hypokinesis. Doppler parameters are   consistent with pseudonormal left ventricular relaxation (grade 2   diastolic dysfunction). The E/e&' ratio is >15, suggesting   elevated LV filling pressure. - Left atrium: The atrium was mildly dilated. - Tricuspid valve: There was trivial regurgitation. - Pulmonary arteries: PA peak pressure: 37 mm Hg (S). - Inferior vena cava: The vessel was dilated. The respirophasic   diameter changes were blunted (< 50%), consistent with elevated   central venous pressure.  Impressions:  - Compared to a prior study in 04/2017, the LVEF is lower at 40-45%   with inferior and lateral wall hypokinesis, grade 2 DD and   elevated LV filling pressure.  EKG:  EKG is not ordered today.    Recent Labs: 05/25/2017: ALT 14; TSH 0.704 05/26/2017: Magnesium 2.5 06/06/2017: B Natriuretic Peptide 33.6; Hemoglobin 12.5; Platelets 125 08/02/2017: BUN 7; Creatinine, Ser 0.96; Potassium 4.0; Sodium 139  Recent Lipid Panel    Component Value Date/Time   CHOL 111 05/26/2017 0637   TRIG 93 05/26/2017 0637   HDL 38 (  L) 05/26/2017 0637   CHOLHDL 2.9 05/26/2017 0637   VLDL 19 05/26/2017 0637    LDLCALC 54 05/26/2017 0637    Physical Exam:    VS:  BP 104/60   Pulse 94   Ht 5' 3"  (1.6 m)   Wt 203 lb 6.4 oz (92.3 kg)   LMP 03/29/2011   SpO2 96%   BMI 36.03 kg/m     Wt Readings from Last 3 Encounters:  11/15/17 203 lb 6.4 oz (92.3 kg)  10/24/17 203 lb 8 oz (92.3 kg)  08/02/17 210 lb (95.3 kg)     GEN:  Pleasant woman in no acute distress HEENT: Normal NECK: Mild JVD; No carotid bruits LYMPHATICS: No lymphadenopathy CARDIAC: irregular with 2/6 systolic murmur at the RUSB RESPIRATORY:  Clear to auscultation without rales, wheezing or rhonchi  ABDOMEN: Soft, non-tender, non-distended MUSCULOSKELETAL:  1+ BL pretibial edema; No deformity  SKIN: Warm and dry NEUROLOGIC:  Alert and oriented x 3 PSYCHIATRIC:  Normal affect   ASSESSMENT:    1. Chronic combined systolic and diastolic heart failure (HCC)   2. Persistent atrial fibrillation ([place])   3. Coronary artery disease involving native coronary artery of native heart with angina pectoris ([place])    PLAN:    In order of problems listed above:  1. The patient's weight is up from baseline and she has evidence of mild volume excess.  Advised her to increase torsemide to 40 mg daily.  She is intolerant of Entresto.  She will continue on valsartan.  In reviewing her medications, it appears that she has been taken off of her beta-blocker because of fatigue.  I am inclined to try her back on a low-dose of metoprolol succinate 12.5 mg.  When she returns in follow-up this can be potentially titrated upwards.  She was previously on 50 mg. 2. Not a candidate for anticoagulation.  The patient has cirrhosis with varices and thrombocytopenia.  Add low-dose metoprolol succinate for heart rate control. 3. Increasing angina noted.  Not a candidate for invasive catheterization or PCI with inability to take any antiplatelet therapy.  Increase isosorbide to 90 mg daily.   Medication Adjustments/Labs and Tests Ordered: Current medicines are  reviewed at length with the patient today.  Concerns regarding medicines are outlined above.  No orders of the defined types were placed in this encounter.  Meds ordered this encounter  Medications  . torsemide (DEMADEX) 20 MG tablet    Sig: Take 2 tablets (40 mg total) by mouth daily.    Dispense:  180 tablet    Refill:  3  . isosorbide mononitrate (IMDUR) 60 MG 24 hr tablet    Sig: Take 1.5 tablets (90 mg total) by mouth daily.    Dispense:  135 tablet    Refill:  3    Please consider 90 day supplies to promote better adherence  . metoprolol succinate (TOPROL XL) 25 MG 24 hr tablet    Sig: Take 0.5 tablets (12.5 mg total) by mouth daily.    Dispense:  45 tablet    Refill:  3    Patient Instructions  Medication Instructions:  1) INCREASE TORSEMIDE to 40 mg daily 2) INCREASE ISOSORBIDE to 90 mg daily 3) START TOPROL (metoprolol succinate) 12.5 mg daily (you will have to cut a 25 mg tablet in half)  Labwork: None  Testing/Procedures: None  Follow-Up: You have an appointment with Richardson Dopp, PA on February 14, 2018 at 9:45AM.  Any Other Special Instructions Will Be Listed  Below (If Applicable).     If you need a refill on your cardiac medications before your next appointment, please call your pharmacy.      Signed, Sherren Mocha, MD  11/16/2017 9:01 AM    [place]

## 2017-11-16 MED ORDER — METOPROLOL SUCCINATE ER 25 MG PO TB24: 12.5000 mg | ORAL_TABLET | Freq: Every day | ORAL | 3 refills | Status: DC

## 2017-11-17 DIAGNOSIS — E1140 Type 2 diabetes mellitus with diabetic neuropathy, unspecified: Secondary | ICD-10-CM | POA: Diagnosis not present

## 2017-11-17 DIAGNOSIS — I959 Hypotension, unspecified: Secondary | ICD-10-CM | POA: Diagnosis not present

## 2017-11-17 DIAGNOSIS — I5022 Chronic systolic (congestive) heart failure: Secondary | ICD-10-CM | POA: Diagnosis not present

## 2017-11-17 DIAGNOSIS — K210 Gastro-esophageal reflux disease with esophagitis: Secondary | ICD-10-CM | POA: Diagnosis not present

## 2017-11-17 DIAGNOSIS — D509 Iron deficiency anemia, unspecified: Secondary | ICD-10-CM | POA: Diagnosis not present

## 2017-11-17 DIAGNOSIS — E782 Mixed hyperlipidemia: Secondary | ICD-10-CM | POA: Diagnosis not present

## 2017-11-17 DIAGNOSIS — E7800 Pure hypercholesterolemia, unspecified: Secondary | ICD-10-CM | POA: Diagnosis not present

## 2017-11-17 DIAGNOSIS — I10 Essential (primary) hypertension: Secondary | ICD-10-CM | POA: Diagnosis not present

## 2017-11-17 DIAGNOSIS — J441 Chronic obstructive pulmonary disease with (acute) exacerbation: Secondary | ICD-10-CM | POA: Diagnosis not present

## 2017-11-21 DIAGNOSIS — Z6836 Body mass index (BMI) 36.0-36.9, adult: Secondary | ICD-10-CM | POA: Diagnosis not present

## 2017-11-21 DIAGNOSIS — I10 Essential (primary) hypertension: Secondary | ICD-10-CM | POA: Diagnosis not present

## 2017-11-21 DIAGNOSIS — M4716 Other spondylosis with myelopathy, lumbar region: Secondary | ICD-10-CM | POA: Diagnosis not present

## 2017-11-21 DIAGNOSIS — G5603 Carpal tunnel syndrome, bilateral upper limbs: Secondary | ICD-10-CM | POA: Diagnosis not present

## 2017-11-21 DIAGNOSIS — I5023 Acute on chronic systolic (congestive) heart failure: Secondary | ICD-10-CM | POA: Diagnosis not present

## 2017-11-21 DIAGNOSIS — Z09 Encounter for follow-up examination after completed treatment for conditions other than malignant neoplasm: Secondary | ICD-10-CM | POA: Diagnosis not present

## 2017-11-21 DIAGNOSIS — K7460 Unspecified cirrhosis of liver: Secondary | ICD-10-CM | POA: Diagnosis not present

## 2017-11-21 DIAGNOSIS — D696 Thrombocytopenia, unspecified: Secondary | ICD-10-CM | POA: Diagnosis not present

## 2017-11-21 DIAGNOSIS — D72819 Decreased white blood cell count, unspecified: Secondary | ICD-10-CM | POA: Diagnosis not present

## 2017-11-21 DIAGNOSIS — Z23 Encounter for immunization: Secondary | ICD-10-CM | POA: Diagnosis not present

## 2017-11-21 DIAGNOSIS — E119 Type 2 diabetes mellitus without complications: Secondary | ICD-10-CM | POA: Diagnosis not present

## 2017-11-22 DIAGNOSIS — M4726 Other spondylosis with radiculopathy, lumbar region: Secondary | ICD-10-CM | POA: Diagnosis not present

## 2017-11-22 DIAGNOSIS — M4716 Other spondylosis with myelopathy, lumbar region: Secondary | ICD-10-CM | POA: Diagnosis not present

## 2017-12-12 DIAGNOSIS — Z9889 Other specified postprocedural states: Secondary | ICD-10-CM | POA: Diagnosis not present

## 2017-12-12 DIAGNOSIS — M545 Low back pain: Secondary | ICD-10-CM | POA: Diagnosis not present

## 2017-12-12 DIAGNOSIS — M4716 Other spondylosis with myelopathy, lumbar region: Secondary | ICD-10-CM | POA: Diagnosis not present

## 2017-12-14 DIAGNOSIS — M47812 Spondylosis without myelopathy or radiculopathy, cervical region: Secondary | ICD-10-CM | POA: Diagnosis not present

## 2017-12-14 DIAGNOSIS — I6523 Occlusion and stenosis of bilateral carotid arteries: Secondary | ICD-10-CM | POA: Diagnosis not present

## 2017-12-14 DIAGNOSIS — M4716 Other spondylosis with myelopathy, lumbar region: Secondary | ICD-10-CM | POA: Diagnosis not present

## 2017-12-14 DIAGNOSIS — R296 Repeated falls: Secondary | ICD-10-CM | POA: Insufficient documentation

## 2017-12-14 DIAGNOSIS — R202 Paresthesia of skin: Secondary | ICD-10-CM | POA: Insufficient documentation

## 2017-12-14 DIAGNOSIS — M5412 Radiculopathy, cervical region: Secondary | ICD-10-CM | POA: Diagnosis not present

## 2017-12-20 DIAGNOSIS — Z951 Presence of aortocoronary bypass graft: Secondary | ICD-10-CM | POA: Diagnosis not present

## 2017-12-20 DIAGNOSIS — R0602 Shortness of breath: Secondary | ICD-10-CM | POA: Diagnosis not present

## 2017-12-20 DIAGNOSIS — K7460 Unspecified cirrhosis of liver: Secondary | ICD-10-CM | POA: Diagnosis not present

## 2017-12-20 DIAGNOSIS — Z880 Allergy status to penicillin: Secondary | ICD-10-CM | POA: Diagnosis not present

## 2017-12-20 DIAGNOSIS — I25118 Atherosclerotic heart disease of native coronary artery with other forms of angina pectoris: Secondary | ICD-10-CM | POA: Diagnosis not present

## 2017-12-20 DIAGNOSIS — K219 Gastro-esophageal reflux disease without esophagitis: Secondary | ICD-10-CM | POA: Diagnosis not present

## 2017-12-20 DIAGNOSIS — E876 Hypokalemia: Secondary | ICD-10-CM | POA: Diagnosis not present

## 2017-12-20 DIAGNOSIS — E785 Hyperlipidemia, unspecified: Secondary | ICD-10-CM | POA: Diagnosis not present

## 2017-12-20 DIAGNOSIS — I110 Hypertensive heart disease with heart failure: Secondary | ICD-10-CM | POA: Diagnosis not present

## 2017-12-20 DIAGNOSIS — Z79899 Other long term (current) drug therapy: Secondary | ICD-10-CM | POA: Diagnosis not present

## 2017-12-20 DIAGNOSIS — I252 Old myocardial infarction: Secondary | ICD-10-CM | POA: Diagnosis not present

## 2017-12-20 DIAGNOSIS — F419 Anxiety disorder, unspecified: Secondary | ICD-10-CM | POA: Diagnosis not present

## 2017-12-20 DIAGNOSIS — I209 Angina pectoris, unspecified: Secondary | ICD-10-CM | POA: Diagnosis not present

## 2017-12-20 DIAGNOSIS — R079 Chest pain, unspecified: Secondary | ICD-10-CM | POA: Diagnosis not present

## 2017-12-20 DIAGNOSIS — I4891 Unspecified atrial fibrillation: Secondary | ICD-10-CM | POA: Diagnosis not present

## 2017-12-20 DIAGNOSIS — F329 Major depressive disorder, single episode, unspecified: Secondary | ICD-10-CM | POA: Diagnosis not present

## 2017-12-20 DIAGNOSIS — I509 Heart failure, unspecified: Secondary | ICD-10-CM | POA: Diagnosis not present

## 2018-01-12 ENCOUNTER — Other Ambulatory Visit: Payer: Self-pay

## 2018-01-12 DIAGNOSIS — M4722 Other spondylosis with radiculopathy, cervical region: Secondary | ICD-10-CM | POA: Insufficient documentation

## 2018-01-19 DIAGNOSIS — M5030 Other cervical disc degeneration, unspecified cervical region: Secondary | ICD-10-CM | POA: Diagnosis not present

## 2018-01-19 DIAGNOSIS — M4802 Spinal stenosis, cervical region: Secondary | ICD-10-CM | POA: Diagnosis not present

## 2018-01-19 DIAGNOSIS — M4722 Other spondylosis with radiculopathy, cervical region: Secondary | ICD-10-CM | POA: Diagnosis not present

## 2018-01-31 ENCOUNTER — Other Ambulatory Visit (INDEPENDENT_AMBULATORY_CARE_PROVIDER_SITE_OTHER): Payer: Self-pay | Admitting: *Deleted

## 2018-01-31 ENCOUNTER — Encounter (INDEPENDENT_AMBULATORY_CARE_PROVIDER_SITE_OTHER): Payer: Self-pay | Admitting: *Deleted

## 2018-01-31 DIAGNOSIS — M5412 Radiculopathy, cervical region: Secondary | ICD-10-CM | POA: Diagnosis not present

## 2018-01-31 DIAGNOSIS — I8500 Esophageal varices without bleeding: Secondary | ICD-10-CM

## 2018-01-31 DIAGNOSIS — K7460 Unspecified cirrhosis of liver: Secondary | ICD-10-CM

## 2018-02-01 DIAGNOSIS — Z1231 Encounter for screening mammogram for malignant neoplasm of breast: Secondary | ICD-10-CM | POA: Diagnosis not present

## 2018-02-02 DIAGNOSIS — I8500 Esophageal varices without bleeding: Secondary | ICD-10-CM | POA: Diagnosis not present

## 2018-02-02 DIAGNOSIS — K7460 Unspecified cirrhosis of liver: Secondary | ICD-10-CM | POA: Diagnosis not present

## 2018-02-02 LAB — CBC
HCT: 31.9 % — ABNORMAL LOW (ref 35.0–45.0)
Hemoglobin: 9.4 g/dL — ABNORMAL LOW (ref 11.7–15.5)
MCH: 22.8 pg — ABNORMAL LOW (ref 27.0–33.0)
MCHC: 29.5 g/dL — ABNORMAL LOW (ref 32.0–36.0)
MCV: 77.2 fL — ABNORMAL LOW (ref 80.0–100.0)
MPV: 11.2 fL (ref 7.5–12.5)
Platelets: 87 10*3/uL — ABNORMAL LOW (ref 140–400)
RBC: 4.13 Million/uL (ref 3.80–5.10)
RDW: 14.8 % (ref 11.0–15.0)
WBC: 3.5 10*3/uL — ABNORMAL LOW (ref 3.8–10.8)

## 2018-02-07 ENCOUNTER — Other Ambulatory Visit (INDEPENDENT_AMBULATORY_CARE_PROVIDER_SITE_OTHER): Payer: Self-pay | Admitting: *Deleted

## 2018-02-07 DIAGNOSIS — D72819 Decreased white blood cell count, unspecified: Secondary | ICD-10-CM

## 2018-02-07 DIAGNOSIS — D696 Thrombocytopenia, unspecified: Secondary | ICD-10-CM

## 2018-02-07 NOTE — Progress Notes (Signed)
cbc

## 2018-02-08 DIAGNOSIS — Z6835 Body mass index (BMI) 35.0-35.9, adult: Secondary | ICD-10-CM | POA: Diagnosis not present

## 2018-02-08 DIAGNOSIS — S9030XA Contusion of unspecified foot, initial encounter: Secondary | ICD-10-CM | POA: Diagnosis not present

## 2018-02-14 ENCOUNTER — Ambulatory Visit: Payer: Medicare Other | Admitting: Physician Assistant

## 2018-02-19 ENCOUNTER — Encounter (INDEPENDENT_AMBULATORY_CARE_PROVIDER_SITE_OTHER): Payer: Self-pay | Admitting: *Deleted

## 2018-02-19 ENCOUNTER — Other Ambulatory Visit (INDEPENDENT_AMBULATORY_CARE_PROVIDER_SITE_OTHER): Payer: Self-pay | Admitting: *Deleted

## 2018-02-19 DIAGNOSIS — D696 Thrombocytopenia, unspecified: Secondary | ICD-10-CM

## 2018-02-19 DIAGNOSIS — D72819 Decreased white blood cell count, unspecified: Secondary | ICD-10-CM

## 2018-03-07 DIAGNOSIS — D696 Thrombocytopenia, unspecified: Secondary | ICD-10-CM | POA: Diagnosis not present

## 2018-03-07 DIAGNOSIS — D72819 Decreased white blood cell count, unspecified: Secondary | ICD-10-CM | POA: Diagnosis not present

## 2018-03-07 LAB — CBC
HCT: 31.7 % — ABNORMAL LOW (ref 35.0–45.0)
Hemoglobin: 9.5 g/dL — ABNORMAL LOW (ref 11.7–15.5)
MCH: 22.6 pg — ABNORMAL LOW (ref 27.0–33.0)
MCHC: 30 g/dL — ABNORMAL LOW (ref 32.0–36.0)
MCV: 75.5 fL — ABNORMAL LOW (ref 80.0–100.0)
MPV: 10.6 fL (ref 7.5–12.5)
Platelets: 85 10*3/uL — ABNORMAL LOW (ref 140–400)
RBC: 4.2 10*6/uL (ref 3.80–5.10)
RDW: 15.4 % — ABNORMAL HIGH (ref 11.0–15.0)
WBC: 3.5 10*3/uL — ABNORMAL LOW (ref 3.8–10.8)

## 2018-03-12 ENCOUNTER — Other Ambulatory Visit (INDEPENDENT_AMBULATORY_CARE_PROVIDER_SITE_OTHER): Payer: Self-pay | Admitting: *Deleted

## 2018-03-12 DIAGNOSIS — D72810 Lymphocytopenia: Secondary | ICD-10-CM

## 2018-03-12 DIAGNOSIS — K7460 Unspecified cirrhosis of liver: Secondary | ICD-10-CM

## 2018-03-12 DIAGNOSIS — D696 Thrombocytopenia, unspecified: Secondary | ICD-10-CM

## 2018-03-14 ENCOUNTER — Encounter: Payer: Self-pay | Admitting: Physician Assistant

## 2018-03-14 ENCOUNTER — Ambulatory Visit: Payer: Medicare Other | Admitting: Physician Assistant

## 2018-03-14 VITALS — BP 116/50 | HR 82 | Ht 63.0 in | Wt 203.0 lb

## 2018-03-14 DIAGNOSIS — I2510 Atherosclerotic heart disease of native coronary artery without angina pectoris: Secondary | ICD-10-CM

## 2018-03-14 DIAGNOSIS — K7460 Unspecified cirrhosis of liver: Secondary | ICD-10-CM

## 2018-03-14 DIAGNOSIS — I4819 Other persistent atrial fibrillation: Secondary | ICD-10-CM | POA: Diagnosis not present

## 2018-03-14 DIAGNOSIS — I5042 Chronic combined systolic (congestive) and diastolic (congestive) heart failure: Secondary | ICD-10-CM | POA: Diagnosis not present

## 2018-03-14 DIAGNOSIS — I10 Essential (primary) hypertension: Secondary | ICD-10-CM

## 2018-03-14 MED ORDER — METOPROLOL SUCCINATE ER 25 MG PO TB24: 25.0000 mg | ORAL_TABLET | Freq: Every day | ORAL | 3 refills | Status: DC

## 2018-03-14 NOTE — Patient Instructions (Signed)
Medication Instructions:  Your physician has recommended you make the following change in your medication:  1. INCREASE TOPROL TO 25 MG DAILY.  If you need a refill on your cardiac medications before your next appointment, please call your pharmacy.   Lab work: BMET TO BE DONE AT PRIMARY CARE DOCTOR: RX SLIP HAS BEEN GIVEN   If you have labs (blood work) drawn today and your tests are completely normal, you will receive your results only by: [name] MyChart Message (if you have MyChart) OR . A paper copy in the mail If you have any lab test that is abnormal or we need to change your treatment, we will call you to review the results.  Testing/Procedures: NONE  Follow-Up: At [facility], you and your health needs are our priority.  As part of our continuing mission to provide you with exceptional heart care, we have created designated Provider Care Teams.  These Care Teams include your primary Cardiologist (physician) and Advanced Practice Providers (APPs -  Physician Assistants and Nurse Practitioners) who all work together to provide you with the care you need, when you need it. You will need a follow up appointment in:  6 months.  Please call our office 2 months in advance to schedule this appointment.  You may see Sherren Mocha, MD  Your physician recommends that you schedule a follow-up appointment in: [place], PA-C   Any Other Special Instructions Will Be Listed Below (If Applicable).

## 2018-03-14 NOTE — Progress Notes (Signed)
Cardiology Office Note:    Date:  03/14/2018   ID:  Samantha [name], DOB 08-11-1950, MRN [id number]  PCP:  [name], MD  Cardiologist:  Sherren Mocha, MD   Electrophysiologist:  None  Advanced Heart Failure Clinic:  Glori Bickers, MD   (released in 07/2017)  Referring MD: [name], MD   Chief Complaint  Patient presents with  . Follow-up    CHF    History of Present Illness:    Samantha [name] is a 68 y.o. female with CAD status post CABG in 2001 and multiple PCI procedures, chronic angina, nonalcoholic cirrhosis with esophageal varices (clopidogrel stopped in the past secondary to varices requiring banding), chronic systolic heart failure, diabetes, atrial flutter, persistent atrial fibrillation, hypertension, hyperlipidemia. She is not felt to be a candidate for anticoagulation secondary to cirrhosis and history of esophageal varices (Dr. Burt Knack has discussed her case with GI). Ejection fraction has been as low as 25-35%. Echo in November 2018 demonstrated somewhat improved LV function with an EF of 30-35. She is not felt to be a candidate for advanced therapies for heart failure. She was admitted in 04/2017 with decompensated congestive heart failure.  He diuresed 19 lbs and was followed by Dr. Haroldine Laws in the [facility] post DC.   EF improved to 50-55 by echo in 3/19.  Patient was last seen in the CHF clinic in 6/19 with plans for prn follow up.  She was last seen by Dr. Burt Knack in 9/19.  She was volume overloaded and her Torsemide was increased.  Of note, Echo in 9/19 demonstrated that her EF had declined again and was 40-45.     Ms. Kreiter returns for follow up.  She remains chronically short of breath without change.  She has shortness of breath with minimal activities.  She has not had any chest pain.  She denies paroxysmal nocturnal dyspnea, significant leg swelling, syncope.  She feels tired and attributes this to her anemia.   Prior CV studies:   The following  studies were reviewed today:  Echo 11/15/2017 EF 40-45, inferior and lateral hypokinesis, grade 2 diastolic dysfunction, mild LAE, trivial TR, PASP 37  Right heart catheterization 05/29/2017 Findings: RA = 11 RV = 51/12 PA = 51/13 (32) PCW = 18 (v = 25-30) Fick cardiac output/index = 9.7/5.0 Thermo CO/CI = 7.6/3.9 PVR = 1.8 WU FA sat = 96% PA sat = 73%, 76% SVC sat = 67% Assessment: 1. Mild to moderately elevated R-sided pressures 2. Minimally elevated wedge pressure with prominent v-waves likely due to diastolic dysfunction 3. High-cardiac output likely due to cirrhotic physiology  4. No evidence of significant intra-cardiac shunting  Echo 05/25/2017 Mild LVH, EF 50-55, MAC, mild MR, moderate LAE, mildly decreased RVSF, moderate RAE, moderate TR, PASP 54  Echo 01/25/17 Mild LVH, EF 30-35, inferior/inferolateral and anterolateral HK, MAC, mild LAE, moderately reduced RVSF, mild RAE, PASP 27  Cardiac catheterization 08/15/16 LAD D1 ostial 65 LCx ostial stent patent with 40 ISR RCA proximal 95 LIMA-OM 2 occluded SVG-AM. patent SVG-D1 patent  Past Medical History:  Diagnosis Date  . Chronic systolic CHF (congestive heart failure) ([place])   . Cirrhosis of liver ([place])   . Coronary artery disease    a. s/p CABG 2001 w/ (LIMA-OM, SVG-D1, SVG-RCA). b. h/o multiple PCIs per Dr. Antionette Char note.  . Depression   . Esophageal varices ([place])    New 2013  . Gastroesophageal reflux disease   . History of pneumonia   .  Hyperlipidemia   . Hypertension   . Obesity   . Osteoarthritis   . Paroxysmal atrial flutter ([place])    a. dx 05/2016.  [name] Persistent atrial fibrillation    a. reported in hosp 07/2016, not on anticoag due to cirrhosis and liver disease, low platelets, varices.  . Thrombocytopenia ([place])    Surgical Hx: The patient  has a past surgical history that includes Coronary artery bypass graft (May 31,2001); Cardiac catheterization (2004); Cardiac catheterization (2006); Cardiac  catheterization (2008); Tonsillectomy; Joint replacement; rotator cuff left (2007); Back surgery; Abdominal hysterectomy; Colonoscopy (03/29/2011); Esophagogastroduodenoscopy (12/20/2011); Esophagogastroduodenoscopy (N/A, 07/04/2012); esophageal banding (N/A, 07/04/2012); Esophagogastroduodenoscopy (N/A, 09/17/2012); esophageal banding (N/A, 09/17/2012); Esophagogastroduodenoscopy (N/A, 10/22/2013); esophageal banding (N/A, 10/22/2013); left heart catheterization with coronary/graft angiogram (N/A, 12/25/2013); Esophagogastroduodenoscopy (N/A, 11/28/2014); esophageal banding (N/A, 11/28/2014); Cardiac catheterization (N/A, 01/09/2015); Esophagogastroduodenoscopy (N/A, 10/29/2015); esophageal banding (N/A, 10/29/2015); LEFT HEART CATH AND CORS/GRAFTS ANGIOGRAPHY (N/A, 08/15/2016); Esophagogastroduodenoscopy (N/A, 12/12/2016); Flexible sigmoidoscopy (N/A, 03/20/2017); and RIGHT HEART CATH (N/A, 05/29/2017).   Current Medications: Current Meds  Medication Sig  . albuterol (PROVENTIL HFA;VENTOLIN HFA) 108 (90 BASE) MCG/ACT inhaler Inhale 2 puffs into the lungs every 6 (six) hours as needed for wheezing or shortness of breath.   [name] atorvastatin (LIPITOR) 20 MG tablet Take 20 mg by mouth every evening.   . calcium carbonate (TUMS - DOSED IN MG ELEMENTAL CALCIUM) 500 MG chewable tablet Chew 1 tablet by mouth daily as needed for indigestion or heartburn.  . cetirizine (ZYRTEC) 10 MG tablet Take 10 mg by mouth daily as needed for allergies (during allergy season).   . ezetimibe (ZETIA) 10 MG tablet Take 10 mg by mouth at bedtime.   [name] FLUoxetine (PROZAC) 40 MG capsule Take 40 mg by mouth daily.   . fluticasone (FLONASE) 50 MCG/ACT nasal spray Place 1 spray into both nostrils daily as needed for allergies.   [name] HYDROcodone-acetaminophen (NORCO) 10-325 MG tablet Take 1 tablet by mouth as needed for pain.  . isosorbide mononitrate (IMDUR) 60 MG 24 hr tablet Take 1.5 tablets (90 mg total) by mouth daily.  [name] NITROSTAT 0.4 MG SL tablet  Place 0.4 mg under the tongue every 5 (five) minutes as needed for chest pain (x 3 tabs daily).   . pantoprazole (PROTONIX) 40 MG tablet Take 40 mg by mouth daily.  . polyethylene glycol (MIRALAX / GLYCOLAX) packet Take 17 g by mouth daily.  [name] torsemide (DEMADEX) 20 MG tablet Take 2 tablets (40 mg total) by mouth daily.  . valsartan (DIOVAN) 160 MG tablet Take 1 tablet (160 mg total) by mouth at bedtime.  . [DISCONTINUED] metoprolol succinate (TOPROL XL) 25 MG 24 hr tablet Take 0.5 tablets (12.5 mg total) by mouth daily.  . [DISCONTINUED] pantoprazole (PROTONIX) 40 MG tablet Take 1 tablet (40 mg total) by mouth 2 (two) times daily. (Patient taking differently: Take 40 mg by mouth daily. )  . [DISCONTINUED] ranitidine (ZANTAC) 150 MG tablet Take 150 mg by mouth at bedtime.     Allergies:   Entresto [sacubitril-valsartan]; Acetaminophen; Oxycodone; Ace inhibitors; Cefaclor; Cephalexin; Penicillins; Pregabalin; and Tape   Social History   Tobacco Use  . Smoking status: Former Smoker    Packs/day: 0.50    Years: 20.00    Pack years: 10.00    Types: Cigarettes    Last attempt to quit: 07/21/1995    Years since quitting: 22.6  . Smokeless tobacco: Never Used  Substance Use Topics  . Alcohol use: No  . Drug use: No  Family Hx: The patient's family history includes Coronary artery disease in her sister; Heart attack in her mother; Heart attack (age of onset: 101) in her father. There is no history of Colon cancer.  ROS:   Please see the history of present illness.    Review of Systems  Cardiovascular: Positive for irregular heartbeat.  Respiratory: Positive for cough.   Musculoskeletal: Positive for back pain.  Neurological: Positive for dizziness.   All other systems reviewed and are negative.   EKGs/Labs/Other Test Reviewed:    EKG:  EKG is   ordered today.  The ekg ordered today demonstrates NSR, HR 82, rightward axis, IVCD, NSSTTW changes, QTc 507, no change from prior  ECGs  Recent Labs: 05/25/2017: ALT 14; TSH 0.704 05/26/2017: Magnesium 2.5 06/06/2017: B Natriuretic Peptide 33.6 08/02/2017: BUN 7; Creatinine, Ser 0.96; Potassium 4.0; Sodium 139 03/07/2018: Hemoglobin 9.5; Platelets 85   Recent Lipid Panel Lab Results  Component Value Date/Time   CHOL 111 05/26/2017 06:37 AM   TRIG 93 05/26/2017 06:37 AM   HDL 38 (L) 05/26/2017 06:37 AM   CHOLHDL 2.9 05/26/2017 06:37 AM   LDLCALC 54 05/26/2017 06:37 AM    Physical Exam:    VS:  BP (!) 116/50   Pulse 82   Ht _0  (1.6 m)   Wt 203 lb (92.1 kg)   LMP 03/29/2011   SpO2 97%   BMI 35.96 kg/m     Wt Readings from Last 3 Encounters:  03/14/18 203 lb (92.1 kg)  11/15/17 203 lb 6.4 oz (92.3 kg)  10/24/17 203 lb 8 oz (92.3 kg)     Physical Exam  Constitutional: She is oriented to person, place, and time. She appears well-developed and well-nourished. No distress.  HENT:  Head: Normocephalic and atraumatic.  Eyes: No scleral icterus.  Neck: Neck supple. No JVD present. No thyromegaly present.  Cardiovascular: Normal rate, regular rhythm, S1 normal and S2 normal.  Murmur heard.  Systolic murmur is present with a grade of 1/6 at the upper left sternal border. Pulmonary/Chest: Effort normal. She has no rales.  Abdominal: Soft. She exhibits no distension. There is no hepatomegaly.  Musculoskeletal:        General: Edema (trace bilat ankle edema) present.  Lymphadenopathy:    She has no cervical adenopathy.  Neurological: She is alert and oriented to person, place, and time.  Skin: Skin is warm and dry.  Psychiatric: She has a normal mood and affect.    ASSESSMENT & PLAN:    Chronic combined systolic and diastolic heart failure ([place]) EF 40-45 by Echo in 9/19.  NYHA 3.  Volume stable.  She was intol of Entresto.  Dr. Burt Knack put her back on beta-blocker at last visit. I think her blood pressure can tolerate an increase in Metoprolol succinate.    -Continue Valsartan, Isosorbide  -Increase  Metoprolol Succ to 25 mg QD  -FU with me in 3 mos and Dr. Burt Knack in 6 mos.  Coronary artery disease involving native coronary artery of native heart without angina pectoris S/p CABG and multiple PCI procedures. 2/3 grafts were patent in 2018.  She is not a candidate for invasive cardiac evaluation due to her bleeding risk. She is managed medically.  She is currently not having angina on her medical regimen.  Continue Atorvastatin, Isosorbide, Metoprolol succ.  She is not on ASA due to bleeding risk.   Persistent atrial fibrillation Currently in NSR.  She is not a candidate for anticoagulation.  Essential hypertension The  patient's blood pressure is controlled on her current regimen.  Continue current therapy.    Cirrhosis, non-alcoholic ([place])   Given her risk of bleeding, she is not on anticoagulation.   Dispo:  Return in about 3 months (around 06/13/2018) for Routine Follow Up, w/ Richardson Dopp, PA-C.   Medication Adjustments/Labs and Tests Ordered: Current medicines are reviewed at length with the patient today.  Concerns regarding medicines are outlined above.  Tests Ordered: No orders of the defined types were placed in this encounter.  Medication Changes: Meds ordered this encounter  Medications  . metoprolol succinate (TOPROL XL) 25 MG 24 hr tablet    Sig: Take 1 tablet (25 mg total) by mouth daily.    Dispense:  90 tablet    Refill:  3    Signed, Richardson Dopp, PA-C  03/14/2018 11:25 AM    [facility] [address], [place], [place]  [address] Phone: [phone]; Fax: [phone]

## 2018-03-19 DIAGNOSIS — E7800 Pure hypercholesterolemia, unspecified: Secondary | ICD-10-CM | POA: Diagnosis not present

## 2018-03-19 DIAGNOSIS — N183 Chronic kidney disease, stage 3 (moderate): Secondary | ICD-10-CM | POA: Diagnosis not present

## 2018-03-19 DIAGNOSIS — I10 Essential (primary) hypertension: Secondary | ICD-10-CM | POA: Diagnosis not present

## 2018-03-19 DIAGNOSIS — K210 Gastro-esophageal reflux disease with esophagitis: Secondary | ICD-10-CM | POA: Diagnosis not present

## 2018-03-19 DIAGNOSIS — E782 Mixed hyperlipidemia: Secondary | ICD-10-CM | POA: Diagnosis not present

## 2018-03-19 DIAGNOSIS — E1140 Type 2 diabetes mellitus with diabetic neuropathy, unspecified: Secondary | ICD-10-CM | POA: Diagnosis not present

## 2018-03-19 DIAGNOSIS — J441 Chronic obstructive pulmonary disease with (acute) exacerbation: Secondary | ICD-10-CM | POA: Diagnosis not present

## 2018-03-20 NOTE — Addendum Note (Signed)
Addended by: Briant Cedar on: 03/20/2018 08:49 AM   Modules accepted: Orders

## 2018-03-22 DIAGNOSIS — I10 Essential (primary) hypertension: Secondary | ICD-10-CM | POA: Diagnosis not present

## 2018-03-22 DIAGNOSIS — Z0001 Encounter for general adult medical examination with abnormal findings: Secondary | ICD-10-CM | POA: Diagnosis not present

## 2018-03-26 ENCOUNTER — Encounter (INDEPENDENT_AMBULATORY_CARE_PROVIDER_SITE_OTHER): Payer: Self-pay | Admitting: *Deleted

## 2018-03-26 ENCOUNTER — Other Ambulatory Visit (INDEPENDENT_AMBULATORY_CARE_PROVIDER_SITE_OTHER): Payer: Self-pay | Admitting: *Deleted

## 2018-03-26 DIAGNOSIS — D72810 Lymphocytopenia: Secondary | ICD-10-CM

## 2018-03-26 DIAGNOSIS — K7460 Unspecified cirrhosis of liver: Secondary | ICD-10-CM

## 2018-03-26 DIAGNOSIS — D696 Thrombocytopenia, unspecified: Secondary | ICD-10-CM

## 2018-04-11 ENCOUNTER — Other Ambulatory Visit: Payer: Self-pay

## 2018-04-11 ENCOUNTER — Inpatient Hospital Stay (HOSPITAL_COMMUNITY)
Admission: EM | Admit: 2018-04-11 | Discharge: 2018-04-14 | DRG: 292 | Disposition: A | Discharge status: Home / Self Care | Payer: Medicare Other | Attending: Cardiology | Admitting: Cardiology

## 2018-04-11 ENCOUNTER — Encounter (HOSPITAL_COMMUNITY): Payer: Self-pay | Admitting: *Deleted

## 2018-04-11 ENCOUNTER — Emergency Department (HOSPITAL_COMMUNITY): Payer: Medicare Other

## 2018-04-11 DIAGNOSIS — D696 Thrombocytopenia, unspecified: Secondary | ICD-10-CM | POA: Diagnosis present

## 2018-04-11 DIAGNOSIS — I8500 Esophageal varices without bleeding: Secondary | ICD-10-CM | POA: Diagnosis present

## 2018-04-11 DIAGNOSIS — Z7951 Long term (current) use of inhaled steroids: Secondary | ICD-10-CM

## 2018-04-11 DIAGNOSIS — Z880 Allergy status to penicillin: Secondary | ICD-10-CM | POA: Diagnosis not present

## 2018-04-11 DIAGNOSIS — Z8249 Family history of ischemic heart disease and other diseases of the circulatory system: Secondary | ICD-10-CM

## 2018-04-11 DIAGNOSIS — I480 Paroxysmal atrial fibrillation: Secondary | ICD-10-CM

## 2018-04-11 DIAGNOSIS — F329 Major depressive disorder, single episode, unspecified: Secondary | ICD-10-CM | POA: Diagnosis present

## 2018-04-11 DIAGNOSIS — Z888 Allergy status to other drugs, medicaments and biological substances status: Secondary | ICD-10-CM | POA: Diagnosis not present

## 2018-04-11 DIAGNOSIS — Z881 Allergy status to other antibiotic agents status: Secondary | ICD-10-CM | POA: Diagnosis not present

## 2018-04-11 DIAGNOSIS — I2510 Atherosclerotic heart disease of native coronary artery without angina pectoris: Secondary | ICD-10-CM | POA: Diagnosis not present

## 2018-04-11 DIAGNOSIS — R079 Chest pain, unspecified: Secondary | ICD-10-CM | POA: Diagnosis not present

## 2018-04-11 DIAGNOSIS — I255 Ischemic cardiomyopathy: Secondary | ICD-10-CM | POA: Diagnosis not present

## 2018-04-11 DIAGNOSIS — M1990 Unspecified osteoarthritis, unspecified site: Secondary | ICD-10-CM | POA: Diagnosis present

## 2018-04-11 DIAGNOSIS — E876 Hypokalemia: Secondary | ICD-10-CM | POA: Diagnosis not present

## 2018-04-11 DIAGNOSIS — Z91048 Other nonmedicinal substance allergy status: Secondary | ICD-10-CM

## 2018-04-11 DIAGNOSIS — I110 Hypertensive heart disease with heart failure: Secondary | ICD-10-CM | POA: Diagnosis not present

## 2018-04-11 DIAGNOSIS — K219 Gastro-esophageal reflux disease without esophagitis: Secondary | ICD-10-CM | POA: Diagnosis present

## 2018-04-11 DIAGNOSIS — K7460 Unspecified cirrhosis of liver: Secondary | ICD-10-CM | POA: Diagnosis not present

## 2018-04-11 DIAGNOSIS — Z87891 Personal history of nicotine dependence: Secondary | ICD-10-CM

## 2018-04-11 DIAGNOSIS — R0602 Shortness of breath: Secondary | ICD-10-CM | POA: Diagnosis not present

## 2018-04-11 DIAGNOSIS — I5023 Acute on chronic systolic (congestive) heart failure: Secondary | ICD-10-CM

## 2018-04-11 DIAGNOSIS — I5043 Acute on chronic combined systolic (congestive) and diastolic (congestive) heart failure: Secondary | ICD-10-CM | POA: Diagnosis present

## 2018-04-11 DIAGNOSIS — Z886 Allergy status to analgesic agent status: Secondary | ICD-10-CM

## 2018-04-11 DIAGNOSIS — I509 Heart failure, unspecified: Secondary | ICD-10-CM | POA: Insufficient documentation

## 2018-04-11 DIAGNOSIS — D649 Anemia, unspecified: Secondary | ICD-10-CM | POA: Diagnosis present

## 2018-04-11 DIAGNOSIS — E785 Hyperlipidemia, unspecified: Secondary | ICD-10-CM | POA: Diagnosis not present

## 2018-04-11 DIAGNOSIS — Z955 Presence of coronary angioplasty implant and graft: Secondary | ICD-10-CM | POA: Diagnosis not present

## 2018-04-11 DIAGNOSIS — D638 Anemia in other chronic diseases classified elsewhere: Secondary | ICD-10-CM | POA: Diagnosis not present

## 2018-04-11 DIAGNOSIS — D72810 Lymphocytopenia: Secondary | ICD-10-CM | POA: Diagnosis not present

## 2018-04-11 DIAGNOSIS — Z951 Presence of aortocoronary bypass graft: Secondary | ICD-10-CM | POA: Diagnosis not present

## 2018-04-11 DIAGNOSIS — I4819 Other persistent atrial fibrillation: Secondary | ICD-10-CM | POA: Diagnosis not present

## 2018-04-11 LAB — CBC
HCT: 31.5 % — ABNORMAL LOW (ref 36.0–46.0)
Hemoglobin: 9 g/dL — ABNORMAL LOW (ref 12.0–15.0)
MCH: 22.6 pg — ABNORMAL LOW (ref 26.0–34.0)
MCHC: 28.6 g/dL — ABNORMAL LOW (ref 30.0–36.0)
MCV: 78.9 fL — ABNORMAL LOW (ref 80.0–100.0)
Platelets: UNDETERMINED 10*3/uL (ref 150–400)
RBC: 3.99 MIL/uL (ref 3.87–5.11)
RDW: 18.3 % — ABNORMAL HIGH (ref 11.5–15.5)
WBC: 3.8 10*3/uL — ABNORMAL LOW (ref 4.0–10.5)
nRBC: 0 % (ref 0.0–0.2)

## 2018-04-11 LAB — BASIC METABOLIC PANEL
Anion gap: 15 (ref 5–15)
BUN: 12 mg/dL (ref 8–23)
CO2: 31 mmol/L (ref 22–32)
Calcium: 9.1 mg/dL (ref 8.9–10.3)
Chloride: 99 mmol/L (ref 98–111)
Creatinine, Ser: 1.19 mg/dL — ABNORMAL HIGH (ref 0.44–1.00)
GFR calc Af Amer: 54 mL/min — ABNORMAL LOW (ref >60)
GFR calc non Af Amer: 47 mL/min — ABNORMAL LOW (ref >60)
Glucose, Bld: 92 mg/dL (ref 70–99)
Potassium: 3.6 mmol/L (ref 3.5–5.1)
Sodium: 145 mmol/L (ref 135–145)

## 2018-04-11 LAB — I-STAT TROPONIN, ED: Troponin i, poc: 0.01 ng/mL (ref 0.00–0.08)

## 2018-04-11 LAB — BRAIN NATRIURETIC PEPTIDE: B Natriuretic Peptide: 159.6 pg/mL — ABNORMAL HIGH (ref 0.0–100.0)

## 2018-04-11 MED ORDER — ONDANSETRON HCL 4 MG/2ML IJ SOLN: 4.0000 mg | Freq: Four times a day (QID) | INTRAMUSCULAR | Status: DC | PRN

## 2018-04-11 MED ORDER — SODIUM CHLORIDE 0.9% FLUSH: 3.0000 mL | INTRAVENOUS | Status: DC | PRN

## 2018-04-11 MED ORDER — FUROSEMIDE 10 MG/ML IJ SOLN
80.0000 mg | Freq: Two times a day (BID) | INTRAMUSCULAR | Status: DC
  Administered 2018-04-12: 80 mg via INTRAVENOUS
  Filled 2018-04-11: qty 8

## 2018-04-11 MED ORDER — SODIUM CHLORIDE 0.9% FLUSH
3.0000 mL | Freq: Two times a day (BID) | INTRAVENOUS | Status: DC
  Administered 2018-04-12 – 2018-04-14 (×5): 3 mL via INTRAVENOUS

## 2018-04-11 MED ORDER — SODIUM CHLORIDE 0.9% FLUSH: 3.0000 mL | Freq: Once | INTRAVENOUS | Status: DC

## 2018-04-11 MED ORDER — FUROSEMIDE 10 MG/ML IJ SOLN
80.0000 mg | Freq: Once | INTRAMUSCULAR | Status: AC
  Administered 2018-04-11: 80 mg via INTRAVENOUS
  Filled 2018-04-11: qty 8

## 2018-04-11 MED ORDER — ACETAMINOPHEN 325 MG PO TABS: 650.0000 mg | ORAL_TABLET | ORAL | Status: DC | PRN

## 2018-04-11 MED ORDER — SODIUM CHLORIDE 0.9 % IV SOLN: 250.0000 mL | INTRAVENOUS | Status: DC | PRN

## 2018-04-11 NOTE — ED Triage Notes (Signed)
Pt endorses sob with exertion and cp.  Hx of CHF.  She also reports bila feet swelling x 2 days.  She is A&Ox 4, in NAD.

## 2018-04-11 NOTE — ED Notes (Signed)
Pt eating [place] sandwich with juice now; no other complaints at this time.

## 2018-04-11 NOTE — ED Notes (Addendum)
Pt ambulated to bathroom on pulseOx (76ft). O2 with good pleth dropped as low as 93. Pt stated "I feel fine, just see how short of breath I get." Pt ambulated independently; no observable issues with balance, strength, or gait. RN aware.

## 2018-04-11 NOTE — ED Provider Notes (Addendum)
[facility] EMERGENCY DEPARTMENT Provider Note   CSN: [id number] Arrival date & time: 04/11/18  1605     History   Chief Complaint Chief Complaint  Patient presents with  . Shortness of Breath  . Chest Pain    HPI Samantha [name] is a 68 y.o. female.  68 yo F with a chief complaint of shortness of breath on exertion and weight gain.  The patient states that she is gained about 9 pounds in the last week.  She has been taking her diuretics as scheduled.  She also has had chest pain off and on with this.  Does not feel like her prior MIs.  Described as a difficult discomfort seems to come and go and not exertional.  Denies radiation of the pain.  Has had some nausea with it.  The history is provided by the patient.  Shortness of Breath  Severity:  Moderate Onset quality:  Gradual Duration:  2 days Timing:  Constant Progression:  Worsening Chronicity:  New Relieved by:  Nothing Worsened by:  Nothing Ineffective treatments:  None tried Associated symptoms: chest pain   Associated symptoms: no fever, no headaches, no vomiting and no wheezing   Chest Pain  Associated symptoms: shortness of breath   Associated symptoms: no dizziness, no fever, no headache, no nausea, no palpitations and no vomiting     Past Medical History:  Diagnosis Date  . Chronic systolic CHF (congestive heart failure) ([place])   . Cirrhosis of liver ([place])   . Coronary artery disease    a. s/p CABG 2001 w/ (LIMA-OM, SVG-D1, SVG-RCA). b. h/o multiple PCIs per Dr. Antionette Char note.  . Depression   . Esophageal varices ([place])    New 2013  . Gastroesophageal reflux disease   . History of pneumonia   . Hyperlipidemia   . Hypertension   . Obesity   . Osteoarthritis   . Paroxysmal atrial flutter ([place])    a. dx 05/2016.  [name] Persistent atrial fibrillation    a. reported in hosp 07/2016, not on anticoag due to cirrhosis and liver disease, low platelets, varices.  . Thrombocytopenia [facility])      Patient Active Problem List   Diagnosis Date Noted  . Rectal pain 03/06/2017  . Other cirrhosis of liver ([place]) 10/26/2016  . Idiopathic esophageal varices without bleeding ([place]) 10/26/2016  . Persistent atrial fibrillation 08/16/2016  . Cardiomyopathy, ischemic-35-40% by cath  01/11/2015  . Chronic combined systolic and diastolic heart failure ([place]) 01/11/2015  . Obesity 09/24/2013  . Unspecified hereditary and idiopathic peripheral neuropathy 01/10/2013  . Cirrhosis, non-alcoholic ([place]) 62/13/0865  . Esophageal varices- Plavix stopped 12/23/2011  . GI bleed 12/23/2011  . Anemia 04/22/2011  . CAD (coronary artery disease) 08/21/2009  . Mixed hyperlipidemia 06/04/2008  . Essential hypertension 06/04/2008  . S/P CABG x 3- 2001 06/04/2008    Past Surgical History:  Procedure Laterality Date  . ABDOMINAL HYSTERECTOMY    . BACK SURGERY    . CARDIAC CATHETERIZATION  2004   left internal mammary artery to the obtuse marginal  was found to be small and thread like.  The two grafts were patent.  The left circumflex had 90% in-stent restenosis and cutting balloon angioplasty was performed followed by placement of a 3.0 x 25mm Taxus drug -eluting stent.    [name] CARDIAC CATHETERIZATION  2006   There was in-stent restenosis in the left circumflex and this was treated with cutting balloon angioplasty   . CARDIAC CATHETERIZATION  2008  vein graft to the to the obtuse marginal was patent, although small, left circumflex had 40% in-stent restenosis, ejection fraction 40-45%.  The patient was medically mananged.  [name] CARDIAC CATHETERIZATION N/A 01/09/2015   Procedure: Left Heart Cath and Cors/Grafts Angiography;  Surgeon: Belva Crome, MD;  Location: [facility] CV LAB;  Service: Cardiovascular;  Laterality: N/A;  . COLONOSCOPY  03/29/2011   Procedure: COLONOSCOPY;  Surgeon: Jamesetta So, MD;  [facility];  Service: Gastroenterology;  Laterality: N/A;  . CORONARY ARTERY BYPASS GRAFT   May 31,2001   x 3 with a vein graft to the first diagonal, vein graft to the right coronary  artery, and a free left internal mammary  artery to the obtuse marginal   . ESOPHAGEAL BANDING N/A 07/04/2012   Procedure: ESOPHAGEAL BANDING;  Surgeon: Rogene Houston, MD;  [facility];  Service: Endoscopy;  Laterality: N/A;  . ESOPHAGEAL BANDING N/A 09/17/2012   Procedure: ESOPHAGEAL BANDING;  Surgeon: Rogene Houston, MD;  [facility];  Service: Endoscopy;  Laterality: N/A;  . ESOPHAGEAL BANDING N/A 10/22/2013   Procedure: ESOPHAGEAL BANDING;  Surgeon: Rogene Houston, MD;  [facility];  Service: Endoscopy;  Laterality: N/A;  . ESOPHAGEAL BANDING N/A 11/28/2014   Procedure: ESOPHAGEAL BANDING;  Surgeon: Rogene Houston, MD;  [facility];  Service: Endoscopy;  Laterality: N/A;  . ESOPHAGEAL BANDING N/A 10/29/2015   Procedure: ESOPHAGEAL BANDING;  Surgeon: Rogene Houston, MD;  [facility];  Service: Endoscopy;  Laterality: N/A;  . ESOPHAGOGASTRODUODENOSCOPY  12/20/2011   Procedure: ESOPHAGOGASTRODUODENOSCOPY (EGD);  Surgeon: Jamesetta So, MD;  [facility];  Service: Gastroenterology;  Laterality: N/A;  . ESOPHAGOGASTRODUODENOSCOPY N/A 07/04/2012   Procedure: ESOPHAGOGASTRODUODENOSCOPY (EGD);  Surgeon: Rogene Houston, MD;  [facility];  Service: Endoscopy;  Laterality: N/A;  235-moved to 255 Ann to notify pt  . ESOPHAGOGASTRODUODENOSCOPY N/A 09/17/2012   Procedure: ESOPHAGOGASTRODUODENOSCOPY (EGD);  Surgeon: Rogene Houston, MD;  [facility];  Service: Endoscopy;  Laterality: N/A;  730  . ESOPHAGOGASTRODUODENOSCOPY N/A 10/22/2013   Procedure: ESOPHAGOGASTRODUODENOSCOPY (EGD);  Surgeon: Rogene Houston, MD;  [facility];  Service: Endoscopy;  Laterality: N/A;  730  . ESOPHAGOGASTRODUODENOSCOPY N/A 11/28/2014   Procedure: ESOPHAGOGASTRODUODENOSCOPY (EGD);  Surgeon: Rogene Houston, MD;  [facility];  Service: Endoscopy;  Laterality: N/A;  1:25  . ESOPHAGOGASTRODUODENOSCOPY N/A 10/29/2015   Procedure: ESOPHAGOGASTRODUODENOSCOPY (EGD);  Surgeon: Rogene Houston, MD;  [facility];  Service: Endoscopy;  Laterality: N/A;  12:00  . ESOPHAGOGASTRODUODENOSCOPY N/A 12/12/2016   Procedure: ESOPHAGOGASTRODUODENOSCOPY (EGD);  Surgeon: Rogene Houston, MD;  [facility];  Service: Endoscopy;  Laterality: N/A;  225  . FLEXIBLE SIGMOIDOSCOPY N/A 03/20/2017   Procedure: FLEXIBLE SIGMOIDOSCOPY;  Surgeon: Rogene Houston, MD;  [facility];  Service: Endoscopy;  Laterality: N/A;  7:30  . JOINT REPLACEMENT    . LEFT HEART CATH AND CORS/GRAFTS ANGIOGRAPHY N/A 08/15/2016   Procedure: Left Heart Cath and Cors/Grafts Angiography;  Surgeon: Jettie Booze, MD;  Location: [facility] CV LAB;  Service: Cardiovascular;  Laterality: N/A;  . LEFT HEART CATHETERIZATION WITH CORONARY/GRAFT ANGIOGRAM N/A 12/25/2013   Procedure: LEFT HEART CATHETERIZATION WITH Beatrix Fetters;  Surgeon: Blane Ohara, MD;  Location: [facility] CATH LAB;  Service: Cardiovascular;  Laterality: N/A;  . RIGHT HEART CATH N/A 05/29/2017   Procedure: RIGHT HEART CATH;  Surgeon: Jolaine Artist, MD;  Location: [facility] CV LAB;  Service: Cardiovascular;  Laterality: N/A;  . rotator cuff left  2007  . TONSILLECTOMY       OB History   No obstetric history on file.      Home Medications    Prior to Admission medications   Medication Sig Start Date End Date Taking? Authorizing Provider  albuterol (PROVENTIL HFA;VENTOLIN HFA) 108 (90 BASE) MCG/ACT inhaler Inhale 2 puffs into the lungs every 6 (six) hours as needed for wheezing or shortness of breath.    Yes [provider]  atorvastatin (LIPITOR) 20 MG tablet Take 20 mg by mouth every evening.    Yes [provider]  calcium carbonate (TUMS - DOSED IN MG ELEMENTAL CALCIUM) 500 MG chewable tablet Chew 1 tablet by mouth  daily as needed for indigestion or heartburn.   Yes [provider]  cetirizine (ZYRTEC) 10 MG tablet Take 10 mg by mouth daily as needed for allergies (during allergy season).    Yes [provider]  ezetimibe (ZETIA) 10 MG tablet Take 10 mg by mouth at bedtime.    Yes [provider]  FLUoxetine (PROZAC) 40 MG capsule Take 40 mg by mouth daily.    Yes [provider]  fluticasone (FLONASE) 50 MCG/ACT nasal spray Place 1 spray into both nostrils daily as needed for allergies.  07/21/16  Yes [provider]  HYDROcodone-acetaminophen (NORCO) 10-325 MG tablet Take 1 tablet by mouth as needed for pain. 11/06/17  Yes [provider]  isosorbide mononitrate (IMDUR) 60 MG 24 hr tablet Take 1.5 tablets (90 mg total) by mouth daily. 11/15/17 11/10/18 Yes Sherren Mocha, MD  metoprolol succinate (TOPROL XL) 25 MG 24 hr tablet Take 1 tablet (25 mg total) by mouth daily. 03/14/18  Yes Weaver, Scott T, PA-C  NITROSTAT 0.4 MG SL tablet Place 0.4 mg under the tongue every 5 (five) minutes as needed for chest pain (x 3 tabs daily).  11/15/13  Yes [provider]  pantoprazole (PROTONIX) 40 MG tablet Take 40 mg by mouth daily.   Yes [provider]  polyethylene glycol (MIRALAX / GLYCOLAX) packet Take 17 g by mouth daily.   Yes [provider]  torsemide (DEMADEX) 20 MG tablet Take 2 tablets (40 mg total) by mouth daily. 11/15/17 11/10/18 Yes Sherren Mocha, MD  valsartan (DIOVAN) 160 MG tablet Take 1 tablet (160 mg total) by mouth at bedtime. 06/15/17 06/10/18 Yes TillerySatira Mccallum, PA-C    Family History Family History  Problem Relation Age of Onset  . Heart attack Mother   . Heart attack Father 9       cause of death  . Coronary artery disease Sister        CABG   . Colon cancer Neg Hx     Social History Social History   Tobacco Use  . Smoking status: Former Smoker    Packs/day: 0.50    Years: 20.00    Pack years:  10.00    Types: Cigarettes    Last attempt to quit: 07/21/1995    Years since quitting: 22.7  . Smokeless tobacco: Never Used  Substance Use Topics  . Alcohol use: No  . Drug use: No     Allergies   Entresto [sacubitril-valsartan]; Acetaminophen; Oxycodone; Ace inhibitors; Cefaclor; Cephalexin; Penicillins; Pregabalin; and Tape   Review of Systems Review of Systems  Constitutional: Negative for chills and fever.  HENT: Negative for congestion and rhinorrhea.  Eyes: Negative for redness and visual disturbance.  Respiratory: Positive for shortness of breath. Negative for wheezing.   Cardiovascular: Positive for chest pain. Negative for palpitations.  Gastrointestinal: Negative for nausea and vomiting.  Genitourinary: Negative for dysuria and urgency.  Musculoskeletal: Negative for arthralgias and myalgias.  Skin: Negative for pallor and wound.  Neurological: Negative for dizziness and headaches.     Physical Exam Updated Vital Signs BP (!) 115/48   Pulse 72   Temp 98.8 F (37.1 C) (Oral)   Resp 15   LMP 03/29/2011   SpO2 93%   Physical Exam Vitals signs and nursing note reviewed.  Constitutional:      General: She is not in acute distress.    Appearance: She is well-developed. She is not diaphoretic.  HENT:     Head: Normocephalic and atraumatic.  Eyes:     Pupils: Pupils are equal, round, and reactive to light.  Neck:     Musculoskeletal: Normal range of motion and neck supple.     Vascular: JVD (just above the clavicles) present.  Cardiovascular:     Rate and Rhythm: Normal rate and regular rhythm.     Heart sounds: No murmur. No friction rub. No gallop.   Pulmonary:     Effort: Pulmonary effort is normal.     Breath sounds: No wheezing or rales.  Abdominal:     General: There is no distension.     Palpations: Abdomen is soft.     Tenderness: There is no abdominal tenderness.  Musculoskeletal:        General: No tenderness.     Right lower leg: Edema  present.     Left lower leg: Edema present.  Skin:    General: Skin is warm and dry.  Neurological:     Mental Status: She is alert and oriented to person, place, and time.  Psychiatric:        Behavior: Behavior normal.      ED Treatments / Results  Labs (all labs ordered are listed, but only abnormal results are displayed) Labs Reviewed  BASIC METABOLIC PANEL - Abnormal; Notable for the following components:      Result Value   Creatinine, Ser 1.19 (*)    GFR calc non Af Amer 47 (*)    GFR calc Af Amer 54 (*)    All other components within normal limits  CBC - Abnormal; Notable for the following components:   WBC 3.8 (*)    Hemoglobin 9.0 (*)    HCT 31.5 (*)    MCV 78.9 (*)    MCH 22.6 (*)    MCHC 28.6 (*)    RDW 18.3 (*)    All other components within normal limits  BRAIN NATRIURETIC PEPTIDE - Abnormal; Notable for the following components:   B Natriuretic Peptide 159.6 (*)    All other components within normal limits  I-STAT TROPONIN, ED    EKG EKG Interpretation  Date/Time:  Wednesday April 11 2018 16:37:12 EST Ventricular Rate:  104 PR Interval:    QRS Duration: 144 QT Interval:  320 QTC Calculation: 420 R Axis:   108 Text Interpretation:  Atrial fibrillation with rapid ventricular response Rightward axis Non-specific intra-ventricular conduction block Nonspecific T wave abnormality Abnormal ECG Otherwise no significant change Confirmed by Deno Etienne [id number]) on 04/11/2018 8:58:51 PM   Radiology Dg Chest 2 View  Result Date: 04/11/2018 CLINICAL DATA:  68 year old female with a history chest pain and shortness of breath EXAM: CHEST -  2 VIEW COMPARISON:  09/12/2007, 05/24/2017 FINDINGS: Cardiomediastinal silhouette unchanged in size and contour. Surgical changes of median sternotomy and CABG. Interstitial opacities of the lungs. Redemonstration linear opacity left lung. No pneumothorax or pleural effusion. No new confluent airspace disease. Osteopenia with no  displaced fracture. Changes of prior augmentation lower thoracic vertebral body IMPRESSION: Chronic lung changes without evidence of acute cardiopulmonary disease. Cardiomegaly with surgical changes of median sternotomy and CABG. Electronically Signed   By: Corrie Mckusick D.O.   On: 04/11/2018 18:48    Procedures Procedures (including critical care time)  Medications Ordered in ED Medications  sodium chloride flush (NS) 0.9 % injection 3 mL (3 mLs Intravenous Not Given 04/11/18 2137)  furosemide (LASIX) injection 80 mg (80 mg Intravenous Given 04/11/18 2137)     Initial Impression / Assessment and Plan / ED Course  I have reviewed the triage vital signs and the nursing notes.  Pertinent labs & imaging results that were available during my care of the patient were reviewed by me and considered in my medical decision making (see chart for details).     68 yo F with a chief complaint of shortness of breath.  This is been going on for the past 3 or 4 days.  Associated with a significant weight gain despite her taking her prescribed diuretic regimen.  Clinically she appears to be not significantly short of breath.  She does have some increased edema to her legs.  Initial troponin is negative.  EKG without concerning change.  Her chest pain sounds a bit atypical although with her history of multiple MIs in the past will discuss with cardiology.  Discussed with Quita Skye, cards fellow who will admit the patient for diuresis.   CRITICAL CARE Performed by: Cecilio Asper   Total critical care time: 35 minutes  Critical care time was exclusive of separately billable procedures and treating other patients.  Critical care was necessary to treat or prevent imminent or life-threatening deterioration.  Critical care was time spent personally by me on the following activities: development of treatment plan with patient and/or surrogate as well as nursing, discussions with consultants, evaluation of  patient's response to treatment, examination of patient, obtaining history from patient or surrogate, ordering and performing treatments and interventions, ordering and review of laboratory studies, ordering and review of radiographic studies, pulse oximetry and re-evaluation of patient's condition.   The patients results and plan were reviewed and discussed.   Any x-rays performed were independently reviewed by myself.   Differential diagnosis were considered with the presenting HPI.  Medications  sodium chloride flush (NS) 0.9 % injection 3 mL (3 mLs Intravenous Not Given 04/11/18 2137)  furosemide (LASIX) injection 80 mg (80 mg Intravenous Given 04/11/18 2137)    Vitals:   04/11/18 1632 04/11/18 2115 04/11/18 2130  BP: 118/86 (!) 100/44 (!) 115/48  Pulse: 80 75 72  Resp: 18 16 15   Temp: 98.8 F (37.1 C)    TempSrc: Oral    SpO2: 98% 97% 93%    Final diagnoses:  Chest pain with moderate risk for cardiac etiology  Paroxysmal atrial fibrillation (HCC)  Acute on chronic combined systolic and diastolic congestive heart failure (HCC)    Admission/ observation were discussed with the admitting physician, patient and/or family and they are comfortable with the plan.    Final Clinical Impressions(s) / ED Diagnoses   Final diagnoses:  Chest pain with moderate risk for cardiac etiology  Paroxysmal atrial fibrillation (HCC)  Acute on chronic  combined systolic and diastolic congestive heart failure [facility])    ED Discharge Orders    None       Deno Etienne, DO 04/11/18 [place], [place], DO 04/24/18 2319

## 2018-04-11 NOTE — H&P (Signed)
Cardiology History & Physical    Patient ID: ANIQA HARE MRN: [id number], DOB: 1951/01/20 Date of Encounter: 04/11/2018, 11:26 PM Primary Physician: [name], MD Primary Cardiologist: Sherren Mocha, MD Primary Electrophysiologist:  None  Chief Complaint: dyspnea Reason for Admission: heart failure exacerbation Requesting MD: Deno Etienne DO  HPI: Samantha [name] is a 68 y.o. female with history of heart failure EF 40 to 45%, CAD status post CABG 2001 and multiple PCI, cirrhosis, esophageal varices, reported atrial fibrillation not on anticoagulation due to high bleeding risk who presents with dyspnea.    Patient reports worsening dyspnea over the past 3 days.  Usually is able to walk around her house and do most things without symptoms.  However, over the past 3 days she gets dyspneic just walking room to room.  She has also noted a 9 lb weight gain and worsening LE edema.  She has been sleeping on more pillows.  She has been taking torsemide 40 mg daily, and for the past 3 days has taken an extra 20 mg in the afternoon without much effect.  No change in salt intake.  She also reports intermittent chest discomfort.  In the ED, labs and CXR were unremarkable.  ECG showed no acute changes.  She was given 80 mg IV lasix and admitted to cardiology for further evaluation.  Past Medical History:  Diagnosis Date  . Chronic systolic CHF (congestive heart failure) ([place])   . Cirrhosis of liver ([place])   . Coronary artery disease    a. s/p CABG 2001 w/ (LIMA-OM, SVG-D1, SVG-RCA). b. h/o multiple PCIs per Dr. Antionette Char note.  . Depression   . Esophageal varices ([place])    New 2013  . Gastroesophageal reflux disease   . History of pneumonia   . Hyperlipidemia   . Hypertension   . Obesity   . Osteoarthritis   . Paroxysmal atrial flutter ([place])    a. dx 05/2016.  [name] Persistent atrial fibrillation    a. reported in hosp 07/2016, not on anticoag due to cirrhosis and liver disease, low platelets,  varices.  . Thrombocytopenia [facility])      Surgical History:  Past Surgical History:  Procedure Laterality Date  . ABDOMINAL HYSTERECTOMY    . BACK SURGERY    . CARDIAC CATHETERIZATION  2004   left internal mammary artery to the obtuse marginal  was found to be small and thread like.  The two grafts were patent.  The left circumflex had 90% in-stent restenosis and cutting balloon angioplasty was performed followed by placement of a 3.0 x 32m Taxus drug -eluting stent.    .Marland KitchenCARDIAC CATHETERIZATION  2006   There was in-stent restenosis in the left circumflex and this was treated with cutting balloon angioplasty   . CARDIAC CATHETERIZATION  2008   vein graft to the to the obtuse marginal was patent, although small, left circumflex had 40% in-stent restenosis, ejection fraction 40-45%.  The patient was medically mananged.  .Marland KitchenCARDIAC CATHETERIZATION N/A 01/09/2015   Procedure: Left Heart Cath and Cors/Grafts Angiography;  Surgeon: HBelva Crome MD;  [facility];  Service: Cardiovascular;  Laterality: N/A;  . COLONOSCOPY  03/29/2011   Procedure: COLONOSCOPY;  Surgeon: MJamesetta So MD;  [facility];  Service: Gastroenterology;  Laterality: N/A;  . CORONARY ARTERY BYPASS GRAFT  May 31,2001   x 3 with a vein graft to the first diagonal, vein graft to the right coronary  artery, and  a free left internal mammary  artery to the obtuse marginal   . ESOPHAGEAL BANDING N/A 07/04/2012   Procedure: ESOPHAGEAL BANDING;  Surgeon: Rogene Houston, MD;  [facility];  Service: Endoscopy;  Laterality: N/A;  . ESOPHAGEAL BANDING N/A 09/17/2012   Procedure: ESOPHAGEAL BANDING;  Surgeon: Rogene Houston, MD;  [facility];  Service: Endoscopy;  Laterality: N/A;  . ESOPHAGEAL BANDING N/A 10/22/2013   Procedure: ESOPHAGEAL BANDING;  Surgeon: Rogene Houston, MD;  [facility];  Service: Endoscopy;  Laterality: N/A;  . ESOPHAGEAL BANDING N/A 11/28/2014    Procedure: ESOPHAGEAL BANDING;  Surgeon: Rogene Houston, MD;  [facility];  Service: Endoscopy;  Laterality: N/A;  . ESOPHAGEAL BANDING N/A 10/29/2015   Procedure: ESOPHAGEAL BANDING;  Surgeon: Rogene Houston, MD;  [facility];  Service: Endoscopy;  Laterality: N/A;  . ESOPHAGOGASTRODUODENOSCOPY  12/20/2011   Procedure: ESOPHAGOGASTRODUODENOSCOPY (EGD);  Surgeon: Jamesetta So, MD;  [facility];  Service: Gastroenterology;  Laterality: N/A;  . ESOPHAGOGASTRODUODENOSCOPY N/A 07/04/2012   Procedure: ESOPHAGOGASTRODUODENOSCOPY (EGD);  Surgeon: Rogene Houston, MD;  [facility];  Service: Endoscopy;  Laterality: N/A;  235-moved to 255 Ann to notify pt  . ESOPHAGOGASTRODUODENOSCOPY N/A 09/17/2012   Procedure: ESOPHAGOGASTRODUODENOSCOPY (EGD);  Surgeon: Rogene Houston, MD;  [facility];  Service: Endoscopy;  Laterality: N/A;  730  . ESOPHAGOGASTRODUODENOSCOPY N/A 10/22/2013   Procedure: ESOPHAGOGASTRODUODENOSCOPY (EGD);  Surgeon: Rogene Houston, MD;  [facility];  Service: Endoscopy;  Laterality: N/A;  730  . ESOPHAGOGASTRODUODENOSCOPY N/A 11/28/2014   Procedure: ESOPHAGOGASTRODUODENOSCOPY (EGD);  Surgeon: Rogene Houston, MD;  [facility];  Service: Endoscopy;  Laterality: N/A;  1:25  . ESOPHAGOGASTRODUODENOSCOPY N/A 10/29/2015   Procedure: ESOPHAGOGASTRODUODENOSCOPY (EGD);  Surgeon: Rogene Houston, MD;  [facility];  Service: Endoscopy;  Laterality: N/A;  12:00  . ESOPHAGOGASTRODUODENOSCOPY N/A 12/12/2016   Procedure: ESOPHAGOGASTRODUODENOSCOPY (EGD);  Surgeon: Rogene Houston, MD;  [facility];  Service: Endoscopy;  Laterality: N/A;  225  . FLEXIBLE SIGMOIDOSCOPY N/A 03/20/2017   Procedure: FLEXIBLE SIGMOIDOSCOPY;  Surgeon: Rogene Houston, MD;  [facility];  Service: Endoscopy;  Laterality: N/A;  7:30  . JOINT REPLACEMENT    . LEFT HEART CATH AND CORS/GRAFTS ANGIOGRAPHY N/A  08/15/2016   Procedure: Left Heart Cath and Cors/Grafts Angiography;  Surgeon: Jettie Booze, MD;  Location: [place] [facility];  Service: Cardiovascular;  Laterality: N/A;  . LEFT HEART CATHETERIZATION WITH CORONARY/GRAFT ANGIOGRAM N/A 12/25/2013   Procedure: LEFT HEART CATHETERIZATION WITH Beatrix Fetters;  Surgeon: Blane Ohara, MD;  Location: [facility];  Service: Cardiovascular;  Laterality: N/A;  . RIGHT HEART CATH N/A 05/29/2017   Procedure: RIGHT HEART CATH;  Surgeon: Jolaine Artist, MD;  Location: [place] [facility];  Service: Cardiovascular;  Laterality: N/A;  . rotator cuff left  2007  . TONSILLECTOMY       Home Meds: Prior to Admission medications   Medication Sig Start Date End Date Taking? Authorizing Provider  albuterol (PROVENTIL HFA;VENTOLIN HFA) 108 (90 BASE) MCG/ACT inhaler Inhale 2 puffs into the lungs every 6 (six) hours as needed for wheezing or shortness of breath.    Yes [provider]  atorvastatin (LIPITOR) 20 MG tablet Take 20 mg by mouth every evening.    Yes [provider]  calcium carbonate (TUMS - DOSED IN MG ELEMENTAL CALCIUM) 500  MG chewable tablet Chew 1 tablet by mouth daily as needed for indigestion or heartburn.   Yes [provider]  cetirizine (ZYRTEC) 10 MG tablet Take 10 mg by mouth daily as needed for allergies (during allergy season).    Yes [provider]  ezetimibe (ZETIA) 10 MG tablet Take 10 mg by mouth at bedtime.    Yes [provider]  FLUoxetine (PROZAC) 40 MG capsule Take 40 mg by mouth daily.    Yes [provider]  fluticasone (FLONASE) 50 MCG/ACT nasal spray Place 1 spray into both nostrils daily as needed for allergies.  07/21/16  Yes [provider]  HYDROcodone-acetaminophen (NORCO) 10-325 MG tablet Take 1 tablet by mouth as needed for pain. 11/06/17  Yes [provider]  isosorbide mononitrate (IMDUR) 60 MG 24 hr tablet Take 1.5 tablets (90 mg  total) by mouth daily. 11/15/17 11/10/18 Yes Sherren Mocha, MD  metoprolol succinate (TOPROL XL) 25 MG 24 hr tablet Take 1 tablet (25 mg total) by mouth daily. 03/14/18  Yes Weaver, Scott T, PA-C  NITROSTAT 0.4 MG SL tablet Place 0.4 mg under the tongue every 5 (five) minutes as needed for chest pain (x 3 tabs daily).  11/15/13  Yes [provider]  pantoprazole (PROTONIX) 40 MG tablet Take 40 mg by mouth daily.   Yes [provider]  polyethylene glycol (MIRALAX / GLYCOLAX) packet Take 17 g by mouth daily.   Yes [provider]  torsemide (DEMADEX) 20 MG tablet Take 2 tablets (40 mg total) by mouth daily. 11/15/17 11/10/18 Yes Sherren Mocha, MD  valsartan (DIOVAN) 160 MG tablet Take 1 tablet (160 mg total) by mouth at bedtime. 06/15/17 06/10/18 Yes Shirley Friar, PA-C    Allergies:  Allergies  Allergen Reactions  . Entresto [Sacubitril-Valsartan] Swelling    Swelling of eyes  . Acetaminophen Other (See Comments)    Liver problems  . Oxycodone Itching and Nausea Only  . Ace Inhibitors Other (See Comments)    UNSURE OF REACTION TYPE  . Cefaclor Itching and Rash  . Cephalexin Itching and Rash  . Penicillins Rash    Has patient had a PCN reaction causing immediate rash, facial/tongue/throat swelling, SOB or lightheadedness with hypotension: YES Has patient had a PCN reaction causing severe rash involving mucus membranes or skin necrosis: NO Has patient had a PCN reaction that required hospitalization: YES Has patient had a PCN reaction occurring within the last 10 years: NO If all of the above answers are "NO", then may proceed with Cephalosporin use.   . Pregabalin Other (See Comments)    Retains fluid  . Tape Itching and Rash    Takes skin right off with medical tape--PAPER TAPE ONLY Other reaction(s): Itching Takes skin right off with medical tape--PAPER TAPE ONLY Takes skin right off with medical tape--PAPER TAPE ONLY    Social History    Socioeconomic History  . Marital status: Divorced    Spouse name: Not on file  . Number of children: Not on file  . Years of education: Not on file  . Highest education level: Not on file  Occupational History  . Occupation: Disabled  Social Needs  . Financial resource strain: Not on file  . Food insecurity:    Worry: Not on file    Inability: Not on file  . Transportation needs:    Medical: Not on file    Non-medical: Not on file  Tobacco Use  . Smoking status: Former Smoker  Packs/day: 0.50    Years: 20.00    Pack years: 10.00    Types: Cigarettes    Last attempt to quit: 07/21/1995    Years since quitting: 22.7  . Smokeless tobacco: Never Used  Substance and Sexual Activity  . Alcohol use: No  . Drug use: No  . Sexual activity: Not on file  Lifestyle  . Physical activity:    Days per week: Not on file    Minutes per session: Not on file  . Stress: Not on file  Relationships  . Social connections:    Talks on phone: Not on file    Gets together: Not on file    Attends religious service: Not on file    Active member of club or organization: Not on file    Attends meetings of clubs or organizations: Not on file    Relationship status: Not on file  . Intimate partner violence:    Fear of current or ex partner: Not on file    Emotionally abused: Not on file    Physically abused: Not on file    Forced sexual activity: Not on file  Other Topics Concern  . Not on file  Social History Narrative   Lives in [place] by herself.       Family History  Problem Relation Age of Onset  . Heart attack Mother   . Heart attack Father 90       cause of death  . Coronary artery disease Sister        CABG   . Colon cancer Neg Hx     Review of Systems: General: negative for chills, fever, night sweats or weight changes.  Cardiovascular: positive for chest pain, edema, orthopnea Dermatological: negative for rash Respiratory: negative for cough or wheezing Urologic:  negative for hematuria Abdominal: negative for nausea, vomiting, diarrhea, bright red blood per rectum, melena, or hematemesis Neurologic: negative for visual changes, syncope, or dizziness All other systems reviewed and are otherwise negative except as noted above.  Labs:   Lab Results  Component Value Date   WBC 3.8 (L) 04/11/2018   HGB 9.0 (L) 04/11/2018   HCT 31.5 (L) 04/11/2018   MCV 78.9 (L) 04/11/2018   PLT PLATELET CLUMPS NOTED ON SMEAR, UNABLE TO ESTIMATE 04/11/2018    Recent Labs  Lab 04/11/18 1715  NA 145  K 3.6  CL 99  CO2 31  BUN 12  CREATININE 1.19*  CALCIUM 9.1  GLUCOSE 92   No results for input(s): CKTOTAL, CKMB, TROPONINI in the last 72 hours. Lab Results  Component Value Date   CHOL 111 05/26/2017   HDL 38 (L) 05/26/2017   LDLCALC 54 05/26/2017   TRIG 93 05/26/2017   Lab Results  Component Value Date   DDIMER  04/24/2008    0.25        AT THE INHOUSE ESTABLISHED CUTOFF VALUE OF 0.48 ug/mL FEU, THIS ASSAY HAS BEEN DOCUMENTED IN THE LITERATURE TO HAVE A SENSITIVITY AND NEGATIVE PREDICTIVE VALUE OF AT LEAST 98 TO 99%.  THE TEST RESULT SHOULD BE CORRELATED WITH AN ASSESSMENT OF THE CLINICAL PROBABILITY OF DVT / VTE.    Radiology/Studies:  Dg Chest 2 View  Result Date: 04/11/2018 CLINICAL DATA:  68 year old female with a history chest pain and shortness of breath EXAM: CHEST - 2 VIEW COMPARISON:  09/12/2007, 05/24/2017 FINDINGS: Cardiomediastinal silhouette unchanged in size and contour. Surgical changes of median sternotomy and CABG. Interstitial opacities of the lungs. Redemonstration linear opacity  left lung. No pneumothorax or pleural effusion. No new confluent airspace disease. Osteopenia with no displaced fracture. Changes of prior augmentation lower thoracic vertebral body IMPRESSION: Chronic lung changes without evidence of acute cardiopulmonary disease. Cardiomegaly with surgical changes of median sternotomy and CABG. Electronically Signed    By: Corrie Mckusick D.O.   On: 04/11/2018 18:48   Wt Readings from Last 3 Encounters:  03/14/18 92.1 kg  11/15/17 92.3 kg  10/24/17 92.3 kg    EKG: sinus rhythm with first degree AV block, IVC QRS 140 ms  Physical Exam: Blood pressure (!) 115/48, pulse 72, temperature 98.8 F (37.1 C), temperature source Oral, resp. rate 15, last menstrual period 03/29/2011, SpO2 93 %. There is no height or weight on file to calculate BMI. General: Well developed, well nourished, in no acute distress. Head: Normocephalic, atraumatic, sclera non-icteric, no xanthomas, nares are without discharge.  Neck: Negative for carotid bruits. JVD is elevated Lungs: Clear bilaterally to auscultation without wheezes, rales, or rhonchi. Breathing is unlabored. Heart: RRR with S1 S2. No murmurs, rubs, or gallops appreciated. Abdomen: Soft, non-tender, non-distended with normoactive bowel sounds. No hepatomegaly. No rebound/guarding. No obvious abdominal masses. Msk:  Strength and tone appear normal for age. Extremities: 1+ pitting edema Neuro: Alert and oriented X 3. No focal deficit. No facial asymmetry. Moves all extremities spontaneously. Psych:  Responds to questions appropriately with a normal affect.    Assessment and Plan   1. Acute on chronic systolic heart failure She reports a 9 lb weight gain and worsening dyspnea, orthopnea, and LE edema over the past 3 days, all consistent with worsening HF.  On exam her JVP is elevated.  Will plan diuresis with bolus dose IV lasix 80 mg.  If she does not respond, can try 120 mg or an infusion.  Will monitor Cr and electrolytes.  Can consider updating echo, but likely would not change mgmt unless EF<=35% (consideration of ICD/CRT). -- IV lasix 80 mg BID -- continue home ARB, BB  2. Chest pressure, history of CAD s/p CABG Possibly related to volume overload.  ECG without evidence of ischemia, and initial POC troponin unremarkable.  Not a candidate for LHC per prior notes,  due to prohibitive bleeding risk.  Will continue statin, zetia  3. Cirrhosis c/b varices No history of ascites or abnormal liver function.  No obvious ascites on exam.  Will check LFTs, INR.    Severity of Illness: The appropriate patient status for this patient is INPATIENT. Inpatient status is judged to be reasonable and necessary in order to provide the required intensity of service to ensure the patient's safety. The patient's presenting symptoms, physical exam findings, and initial radiographic and laboratory data in the context of their chronic comorbidities is felt to place them at high risk for further clinical deterioration. Furthermore, it is not anticipated that the patient will be medically stable for discharge from the hospital within 2 midnights of admission. The following factors support the patient status of inpatient.   " The patient's presenting symptoms include dyspnea. " The worrisome physical exam findings include elevated JVD, peripheral edema. " The initial radiographic and laboratory data are worrisome because of none. " The chronic co-morbidities include heart failure, cirrhosis.   * I certify that at the point of admission it is my clinical judgment that the patient will require inpatient hospital care spanning beyond 2 midnights from the point of admission due to high intensity of service, high risk for further deterioration and high frequency  of surveillance required.*    For questions or updates, please contact [place] Please consult [email or web address] for contact info under Cardiology/STEMI.  Liliane Bade, MD 04/11/2018, 11:26 PM

## 2018-04-12 ENCOUNTER — Other Ambulatory Visit: Payer: Self-pay

## 2018-04-12 ENCOUNTER — Encounter (HOSPITAL_COMMUNITY): Payer: Self-pay

## 2018-04-12 DIAGNOSIS — I5043 Acute on chronic combined systolic (congestive) and diastolic (congestive) heart failure: Secondary | ICD-10-CM

## 2018-04-12 LAB — BASIC METABOLIC PANEL
Anion gap: 13 (ref 5–15)
Anion gap: 11 (ref 5–15)
BUN: 13 mg/dL (ref 8–23)
BUN: 14 mg/dL (ref 8–23)
CO2: 30 mmol/L (ref 22–32)
CO2: 25 mmol/L (ref 22–32)
Calcium: 8.6 mg/dL — ABNORMAL LOW (ref 8.9–10.3)
Calcium: 8.5 mg/dL — ABNORMAL LOW (ref 8.9–10.3)
Chloride: 98 mmol/L (ref 98–111)
Chloride: 103 mmol/L (ref 98–111)
Creatinine, Ser: 1.44 mg/dL — ABNORMAL HIGH (ref 0.44–1.00)
Creatinine, Ser: 1.16 mg/dL — ABNORMAL HIGH (ref 0.44–1.00)
GFR calc Af Amer: 43 mL/min — ABNORMAL LOW (ref >60)
GFR calc Af Amer: 56 mL/min — ABNORMAL LOW (ref >60)
GFR calc non Af Amer: 37 mL/min — ABNORMAL LOW (ref >60)
GFR calc non Af Amer: 48 mL/min — ABNORMAL LOW (ref >60)
Glucose, Bld: 112 mg/dL — ABNORMAL HIGH (ref 70–99)
Glucose, Bld: 107 mg/dL — ABNORMAL HIGH (ref 70–99)
Potassium: 3.1 mmol/L — ABNORMAL LOW (ref 3.5–5.1)
Potassium: 3.5 mmol/L (ref 3.5–5.1)
Sodium: 141 mmol/L (ref 135–145)
Sodium: 139 mmol/L (ref 135–145)

## 2018-04-12 LAB — CBC
HCT: 30.5 % — ABNORMAL LOW (ref 35.0–45.0)
Hemoglobin: 8.8 g/dL — ABNORMAL LOW (ref 11.7–15.5)
MCH: 22.1 pg — ABNORMAL LOW (ref 27.0–33.0)
MCHC: 28.9 g/dL — ABNORMAL LOW (ref 32.0–36.0)
MCV: 76.4 fL — ABNORMAL LOW (ref 80.0–100.0)
MPV: 11.2 fL (ref 7.5–12.5)
Platelets: 79 10*3/uL — ABNORMAL LOW (ref 140–400)
RBC: 3.99 10*6/uL (ref 3.80–5.10)
RDW: 15.6 % — ABNORMAL HIGH (ref 11.0–15.0)
WBC: 3.5 10*3/uL — ABNORMAL LOW (ref 3.8–10.8)

## 2018-04-12 LAB — HEPATIC FUNCTION PANEL
ALT: 11 U/L (ref 0–44)
AST: 29 U/L (ref 15–41)
Albumin: 3 g/dL — ABNORMAL LOW (ref 3.5–5.0)
Alkaline Phosphatase: 54 U/L (ref 38–126)
Bilirubin, Direct: 0.3 mg/dL — ABNORMAL HIGH (ref 0.0–0.2)
Indirect Bilirubin: 1 mg/dL — ABNORMAL HIGH (ref 0.3–0.9)
Total Bilirubin: 1.3 mg/dL — ABNORMAL HIGH (ref 0.3–1.2)
Total Protein: 6.2 g/dL — ABNORMAL LOW (ref 6.5–8.1)

## 2018-04-12 LAB — PROTIME-INR
INR: 1.39
Prothrombin Time: 16.9 seconds — ABNORMAL HIGH (ref 11.4–15.2)

## 2018-04-12 LAB — TROPONIN I: Troponin I: <0.03 ng/mL (ref <0.03)

## 2018-04-12 LAB — TSH: TSH: 1.594 u[IU]/mL (ref 0.350–4.500)

## 2018-04-12 MED ORDER — FLUOXETINE HCL 20 MG PO CAPS
40.0000 mg | ORAL_CAPSULE | Freq: Every day | ORAL | Status: DC
  Administered 2018-04-13 – 2018-04-14 (×2): 40 mg via ORAL
  Filled 2018-04-12: qty 2
  Filled 2018-04-12: qty 1
  Filled 2018-04-12: qty 2

## 2018-04-12 MED ORDER — IRBESARTAN 300 MG PO TABS
150.0000 mg | ORAL_TABLET | Freq: Every day | ORAL | Status: DC
  Administered 2018-04-13 – 2018-04-14 (×2): 150 mg via ORAL
  Filled 2018-04-12 (×2): qty 1

## 2018-04-12 MED ORDER — ATORVASTATIN CALCIUM 10 MG PO TABS
20.0000 mg | ORAL_TABLET | Freq: Every evening | ORAL | Status: DC
  Administered 2018-04-12 – 2018-04-13 (×2): 20 mg via ORAL
  Filled 2018-04-12 (×2): qty 2

## 2018-04-12 MED ORDER — POTASSIUM CHLORIDE CRYS ER 20 MEQ PO TBCR
40.0000 meq | EXTENDED_RELEASE_TABLET | ORAL | Status: AC
  Administered 2018-04-12 (×2): 40 meq via ORAL
  Filled 2018-04-12 (×2): qty 2

## 2018-04-12 MED ORDER — PANTOPRAZOLE SODIUM 40 MG PO TBEC
40.0000 mg | DELAYED_RELEASE_TABLET | Freq: Every day | ORAL | Status: DC
  Administered 2018-04-12 – 2018-04-14 (×3): 40 mg via ORAL
  Filled 2018-04-12 (×3): qty 1

## 2018-04-12 MED ORDER — ISOSORBIDE MONONITRATE ER 60 MG PO TB24
90.0000 mg | ORAL_TABLET | Freq: Every day | ORAL | Status: DC
  Administered 2018-04-12 – 2018-04-14 (×3): 90 mg via ORAL
  Filled 2018-04-12: qty 3
  Filled 2018-04-12 (×2): qty 1

## 2018-04-12 MED ORDER — EZETIMIBE 10 MG PO TABS
10.0000 mg | ORAL_TABLET | Freq: Every day | ORAL | Status: DC
  Administered 2018-04-12 – 2018-04-13 (×2): 10 mg via ORAL
  Filled 2018-04-12 (×2): qty 1

## 2018-04-12 MED ORDER — METOPROLOL SUCCINATE ER 25 MG PO TB24
25.0000 mg | ORAL_TABLET | Freq: Every day | ORAL | Status: DC
  Administered 2018-04-12 – 2018-04-14 (×3): 25 mg via ORAL
  Filled 2018-04-12 (×3): qty 1

## 2018-04-12 MED ORDER — HYDROMORPHONE HCL 2 MG PO TABS
2.0000 mg | ORAL_TABLET | Freq: Once | ORAL | Status: AC
  Administered 2018-04-12: 2 mg via ORAL
  Filled 2018-04-12: qty 1

## 2018-04-12 MED ORDER — POLYETHYLENE GLYCOL 3350 17 G PO PACK
17.0000 g | PACK | Freq: Every day | ORAL | Status: DC
  Administered 2018-04-12 – 2018-04-14 (×4): 17 g via ORAL
  Filled 2018-04-12 (×3): qty 1

## 2018-04-12 MED ORDER — FUROSEMIDE 10 MG/ML IJ SOLN
120.0000 mg | Freq: Two times a day (BID) | INTRAVENOUS | Status: DC
  Administered 2018-04-12: 120 mg via INTRAVENOUS
  Filled 2018-04-12: qty 10
  Filled 2018-04-12: qty 12

## 2018-04-12 MED ORDER — POTASSIUM CHLORIDE CRYS ER 20 MEQ PO TBCR
40.0000 meq | EXTENDED_RELEASE_TABLET | Freq: Once | ORAL | Status: AC
  Administered 2018-04-12: 40 meq via ORAL
  Filled 2018-04-12: qty 2

## 2018-04-12 NOTE — Progress Notes (Addendum)
Progress Note  Patient Name: Samantha [name] Date of Encounter: 04/12/2018  Primary Cardiologist: Sherren Mocha, MD   Subjective   Still feeling poorly, hasn't eaten much as she does not care for the food. Generally feeling weak and swollen. Feels that she has urinated some with the lasix, though maybe not as much as she does at home. Has been difficult to track ins/outs. Awaiting a bed upstairs.  Inpatient Medications    Scheduled Meds: . atorvastatin  20 mg Oral QPM  . ezetimibe  10 mg Oral QHS  . FLUoxetine  40 mg Oral Daily  . furosemide  80 mg Intravenous Q12H  . irbesartan  150 mg Oral Daily  . isosorbide mononitrate  90 mg Oral Daily  . metoprolol succinate  25 mg Oral Daily  . pantoprazole  40 mg Oral Daily  . polyethylene glycol  17 g Oral Daily  . sodium chloride flush  3 mL Intravenous Once  . sodium chloride flush  3 mL Intravenous Q12H   Continuous Infusions: . sodium chloride     PRN Meds: sodium chloride, ondansetron (ZOFRAN) IV, sodium chloride flush   Vital Signs    Vitals:   04/12/18 0730 04/12/18 0745 04/12/18 0800 04/12/18 1045  BP: (!) 111/47  (!) 104/40 (!) 106/35  Pulse: 75 70 69 81  Resp: 17 17 15 16   Temp:    97.6 F (36.4 C)  TempSrc:    Oral  SpO2: 93% 92% 91% 93%   No intake or output data in the 24 hours ending 04/12/18 1131 Last 3 Weights 03/14/2018 11/15/2017 10/24/2017  Weight (lbs) 203 lb 203 lb 6.4 oz 203 lb 8 oz  Weight (kg) 92.08 kg 92.262 kg 92.307 kg      Telemetry    Sinus rhythm - Personally Reviewed  ECG    SR, PACs, IVCD, 1st degree block - Personally Reviewed  Physical Exam   GEN: No acute distress. Resting comfortably in bed Neck: JVD at low neck sitting at 60 degress Cardiac: RRR with occasional early beat, no murmurs, rubs, or gallops.  Respiratory: Clear to auscultation bilaterally. GI: Soft, nontender, non-distended  MS: 1+ edema; No deformity. Neuro:  Nonfocal  Psych: Normal affect   Labs      Chemistry Recent Labs  Lab 04/11/18 1715 04/12/18 0235 04/12/18 0552  NA 145 141  --   K 3.6 3.1*  --   CL 99 98  --   CO2 31 30  --   GLUCOSE 92 112*  --   BUN 12 13  --   CREATININE 1.19* 1.44*  --   CALCIUM 9.1 8.6*  --   PROT  --   --  6.2*  ALBUMIN  --   --  3.0*  AST  --   --  29  ALT  --   --  11  ALKPHOS  --   --  54  BILITOT  --   --  1.3*  GFRNONAA 47* 37*  --   GFRAA 54* 43*  --   ANIONGAP 15 13  --      Hematology Recent Labs  Lab 04/11/18 1203 04/11/18 1715  WBC 3.5* 3.8*  RBC 3.99 3.99  HGB 8.8* 9.0*  HCT 30.5* 31.5*  MCV 76.4* 78.9*  MCH 22.1* 22.6*  MCHC 28.9* 28.6*  RDW 15.6* 18.3*  PLT 79* PLATELET CLUMPS NOTED ON SMEAR, UNABLE TO ESTIMATE    Cardiac Enzymes Recent Labs  Lab 04/12/18 0552  TROPONINI <  0.03    Recent Labs  Lab 04/11/18 2115  TROPIPOC 0.01     BNP Recent Labs  Lab 04/11/18 1715  BNP 159.6*     DDimer No results for input(s): DDIMER in the last 168 hours.   Radiology    Dg Chest 2 View  Result Date: 04/11/2018 CLINICAL DATA:  68 year old female with a history chest pain and shortness of breath EXAM: CHEST - 2 VIEW COMPARISON:  09/12/2007, 05/24/2017 FINDINGS: Cardiomediastinal silhouette unchanged in size and contour. Surgical changes of median sternotomy and CABG. Interstitial opacities of the lungs. Redemonstration linear opacity left lung. No pneumothorax or pleural effusion. No new confluent airspace disease. Osteopenia with no displaced fracture. Changes of prior augmentation lower thoracic vertebral body IMPRESSION: Chronic lung changes without evidence of acute cardiopulmonary disease. Cardiomegaly with surgical changes of median sternotomy and CABG. Electronically Signed   By: Corrie Mckusick D.O.   On: 04/11/2018 18:48    Cardiac Studies   Echo 11/15/17 - Left ventricle: The cavity size was normal. Wall thickness was   normal. Systolic function was mildly to moderately reduced. The   estimated ejection  fraction was in the range of 40% to 45%.   Inferior and lateral hypokinesis. Doppler parameters are   consistent with pseudonormal left ventricular relaxation (grade 2   diastolic dysfunction). The E/e&' ratio is >15, suggesting   elevated LV filling pressure. - Left atrium: The atrium was mildly dilated. - Tricuspid valve: There was trivial regurgitation. - Pulmonary arteries: PA peak pressure: 37 mm Hg (S). - Inferior vena cava: The vessel was dilated. The respirophasic   diameter changes were blunted (< 50%), consistent with elevated   central venous pressure.  Impressions: - Compared to a prior study in 04/2017, the LVEF is lower at 40-45%   with inferior and lateral wall hypokinesis, grade 2 DD and   elevated LV filling pressure.  Patient Profile     68 y.o. female with PMH chronic systolic heart failure (EF 40-45%), CAD s/p CABG 2001 with multiple PCI, cirrhosis with esophageal varices, report of atrial fibrillation not on anticoagulation 2/2 bleeding risk who presented with worsening dyspnea for three days.  Assessment & Plan    Acute on chronic systolic and diastolic heart failure: 9 lb weight gain, 3 days of worsening dyspnea and LE edema with orthopnea.  -no improvement with extra torsemide dose at home -diuresing while boarding in ER, difficult to track I/O and weights. Reports only fair urine output. Will increase from 80 mg IV to 120 mg IV BID -following electrolytes -continue home irbesartan 150 gm daily -continue home metoprolol succinate 25 mg daily  Hypokalemia: K 3.1, given oral repletion this AM. Recheck BMET, likely will need continued supplementation with diuresis.  CAD s/p CABG and multiple PCI: -no aspirin given high bleeding risk -continue home atorvastatin, ezetimibe -continue home imdur -troponins have been negative, unlikely that ischemia is cause of chest pain.  For questions or updates, please contact [place] Please consult [email or web address] for  contact info under    Signed, Buford Dresser, MD  04/12/2018, 11:31 AM

## 2018-04-12 NOTE — Progress Notes (Signed)
Patient c/o pounding headache and didn't have any PRN pain med on MAR. Paged the cardiologist on call and received a new order from Dr. Caban Lions  for Dilaudid 2 mg oral x one dose. Pain med already administered to the patient. Will continue to monitor.

## 2018-04-12 NOTE — Plan of Care (Signed)

## 2018-04-12 NOTE — ED Notes (Signed)
Patient ambulatory to bathroom with steady gait at this time 

## 2018-04-12 NOTE — ED Notes (Signed)
Breakfast ordered at this time.

## 2018-04-13 DIAGNOSIS — D638 Anemia in other chronic diseases classified elsewhere: Secondary | ICD-10-CM

## 2018-04-13 LAB — CBC
HCT: 27.5 % — ABNORMAL LOW (ref 36.0–46.0)
Hemoglobin: 7.7 g/dL — ABNORMAL LOW (ref 12.0–15.0)
MCH: 21.8 pg — ABNORMAL LOW (ref 26.0–34.0)
MCHC: 28 g/dL — ABNORMAL LOW (ref 30.0–36.0)
MCV: 77.9 fL — ABNORMAL LOW (ref 80.0–100.0)
Platelets: 63 10*3/uL — ABNORMAL LOW (ref 150–400)
RBC: 3.53 MIL/uL — ABNORMAL LOW (ref 3.87–5.11)
RDW: 18.2 % — ABNORMAL HIGH (ref 11.5–15.5)
WBC: 3.1 10*3/uL — ABNORMAL LOW (ref 4.0–10.5)
nRBC: 0 % (ref 0.0–0.2)

## 2018-04-13 LAB — BASIC METABOLIC PANEL
Anion gap: 6 (ref 5–15)
BUN: 12 mg/dL (ref 8–23)
CO2: 29 mmol/L (ref 22–32)
Calcium: 8.7 mg/dL — ABNORMAL LOW (ref 8.9–10.3)
Chloride: 107 mmol/L (ref 98–111)
Creatinine, Ser: 1.01 mg/dL — ABNORMAL HIGH (ref 0.44–1.00)
GFR calc Af Amer: >60 mL/min (ref >60)
GFR calc non Af Amer: 57 mL/min — ABNORMAL LOW (ref >60)
Glucose, Bld: 109 mg/dL — ABNORMAL HIGH (ref 70–99)
Potassium: 4.1 mmol/L (ref 3.5–5.1)
Sodium: 142 mmol/L (ref 135–145)

## 2018-04-13 MED ORDER — TRAMADOL HCL 50 MG PO TABS
50.0000 mg | ORAL_TABLET | Freq: Four times a day (QID) | ORAL | Status: DC | PRN
  Administered 2018-04-13: 50 mg via ORAL
  Filled 2018-04-13: qty 1

## 2018-04-13 MED ORDER — TORSEMIDE 20 MG PO TABS
40.0000 mg | ORAL_TABLET | Freq: Two times a day (BID) | ORAL | Status: DC
  Administered 2018-04-13 – 2018-04-14 (×2): 40 mg via ORAL
  Filled 2018-04-13 (×2): qty 2

## 2018-04-13 NOTE — Evaluation (Signed)
Physical Therapy Evaluation Patient Details Name: Samantha [name] MRN: [id number] DOB: [birth year] Today's Date: 04/13/2018   History of Present Illness  Pt is a 68 y.o. female admitted 04/11/18 for CHF exacerbation. PMH includes HF, CAD, cirrhosis, afib (not on anticoagulation).    Clinical Impression  Pt presents with an overall decrease in functional mobility secondary to above. PTA, pt indep with mobility, although limited to short distance secondary to significant DOE; lives alone. Today, pt only able to ambulate 68' before needing seated rest secondary to fatigue/DOE; SpO2 >90% on RA. Educ on activity recommendations with current condition, energy conservation strategies, and use of rollator for added stability/energy conservation,. Pt would benefit from continued acute PT services to maximize functional mobility and independence prior to d/c home.     Follow Up Recommendations No PT follow up;Supervision - Intermittent    Equipment Recommendations  None recommended by PT    Recommendations for Other Services       Precautions / Restrictions Precautions Precautions: Fall Precaution Comments: Significant DOE Restrictions Weight Bearing Restrictions: No      Mobility  Bed Mobility Overal bed mobility: Independent                Transfers Overall transfer level: Independent Equipment used: None                Ambulation/Gait Ambulation/Gait assistance: Supervision Gait Distance (Feet): 80 Feet Assistive device: None Gait Pattern/deviations: Step-through pattern;Decreased stride length Gait velocity: Decreased Gait velocity interpretation: <1.31 ft/sec, indicative of household ambulator General Gait Details: Slow, labored gait without device; supervision for safety secondary to pt reporting she feels weak. Significant DOE requiring multiple standing rest breaks. SpO2 90-96% on RA  Stairs            Wheelchair Mobility    Modified Rankin (Stroke  Patients Only)       Balance Overall balance assessment: Needs assistance   Sitting balance-Leahy Scale: Good       Standing balance-Leahy Scale: Good                               Pertinent Vitals/Pain Pain Assessment: No/denies pain    Home Living Family/patient expects to be discharged to:: Private residence Living Arrangements: Alone Available Help at Discharge: Family;Available PRN/intermittently Type of Home: House Home Access: Level entry     Home Layout: One level Home Equipment: Walker - 2 wheels;Walker - 4 wheels;Cane - single point;Shower seat      Prior Function Level of Independence: Independent         Comments: Drives. Does not use device despite multiple falls; reports "legs give out"     Hand Dominance        Extremity/Trunk Assessment   Upper Extremity Assessment Upper Extremity Assessment: Overall WFL for tasks assessed    Lower Extremity Assessment Lower Extremity Assessment: Overall WFL for tasks assessed       Communication   Communication: No difficulties  Cognition Arousal/Alertness: Awake/alert Behavior During Therapy: WFL for tasks assessed/performed Overall Cognitive Status: Within Functional Limits for tasks assessed                                        General Comments      Exercises     Assessment/Plan    PT Assessment Patient needs continued PT services  PT Problem List Decreased activity tolerance;Decreased balance;Decreased mobility       PT Treatment Interventions DME instruction;Gait training;Stair training;Functional mobility training;Therapeutic activities;Therapeutic exercise;Balance training;Patient/family education    PT Goals (Current goals can be found in the Care Plan section)  Acute Rehab PT Goals Patient Stated Goal: Better breathing PT Goal Formulation: With patient Time For Goal Achievement: 04/27/18 Potential to Achieve Goals: Fair    Frequency Min  3X/week   Barriers to discharge        Co-evaluation               AM-PAC PT "6 Clicks" Mobility  Outcome Measure Help needed turning from your back to your side while in a flat bed without using bedrails?: None Help needed moving from lying on your back to sitting on the side of a flat bed without using bedrails?: None Help needed moving to and from a bed to a chair (including a wheelchair)?: None Help needed standing up from a chair using your arms (e.g., wheelchair or bedside chair)?: None Help needed to walk in hospital room?: None Help needed climbing 3-5 steps with a railing? : A Little 6 Click Score: 23    End of Session Equipment Utilized During Treatment: Gait belt Activity Tolerance: Patient tolerated treatment well;Patient limited by fatigue Patient left: in chair;with call bell/phone within reach Nurse Communication: Mobility status PT Visit Diagnosis: Other abnormalities of gait and mobility (R26.89)    Time: 2811-8867 PT Time Calculation (min) (ACUTE ONLY): 21 min   Charges:   PT Evaluation $PT Eval Moderate Complexity: [place], PT, DPT Acute Rehabilitation Services  Pager [phone] Office [place] 04/13/2018, 12:29 PM

## 2018-04-13 NOTE — Care Management Note (Signed)
Case Management Note  Patient Details  Name: Samantha [name] MRN: [id number] Date of Birth: 1950/09/25  Subjective/Objective:   CHF             Action/Plan: Patient lives at home;Primary Physician: [name], MD; has private insurance with [facility] with prescription drug coverage; CM will continue to follow for progression of care.   Expected Discharge Date:   possibly 04/17/2018               Expected Discharge Plan:  Home/Self Care  In-House Referral:   [facility]  Discharge planning Services  CM Consult  Status of Service:  In process, will continue to follow  Sherrilyn Rist [phone] 04/13/2018, 2:10 PM

## 2018-04-13 NOTE — Care Management Important Message (Signed)
Important Message  Patient Details  Name: Samantha [name] MRN: [id number] Date of Birth: 1950-08-25   Medicare Important Message Given:  Yes    Mardelle Pandolfi P [place] 04/13/2018, 11:46 AM

## 2018-04-13 NOTE — Progress Notes (Signed)
Progress Note  Patient Name: Samantha [name] Date of Encounter: 04/13/2018  Primary Cardiologist: Sherren Mocha, MD   Subjective   Has had some IV access issues, getting IV replaced this AM. Wasn't sure much of PM dose of lasix yesterday went in. No specific complaints today, just feels "bad." Thinks she is urinating some, but not as much as at home. Feels thirsty all the time, went to sink and got some water last night because her mouth is dry all the time.   Inpatient Medications    Scheduled Meds: . atorvastatin  20 mg Oral QPM  . ezetimibe  10 mg Oral QHS  . FLUoxetine  40 mg Oral Daily  . irbesartan  150 mg Oral Daily  . isosorbide mononitrate  90 mg Oral Daily  . metoprolol succinate  25 mg Oral Daily  . pantoprazole  40 mg Oral Daily  . polyethylene glycol  17 g Oral Daily  . sodium chloride flush  3 mL Intravenous Once  . sodium chloride flush  3 mL Intravenous Q12H   Continuous Infusions: . sodium chloride    . furosemide 120 mg (04/12/18 2339)   PRN Meds: sodium chloride, ondansetron (ZOFRAN) IV, sodium chloride flush   Vital Signs    Vitals:   04/13/18 0106 04/13/18 0526 04/13/18 0946 04/13/18 1156  BP: (!) 92/46 (!) 90/43 (!) 82/44 (!) 102/52  Pulse: (!) 56 79 80 77  Resp:  18  18  Temp:  97.9 F (36.6 C)  98.3 F (36.8 C)  TempSrc:  Oral  Oral  SpO2:  91%  95%  Weight: 94.5 kg       Intake/Output Summary (Last 24 hours) at 04/13/2018 1220 Last data filed at 04/13/2018 1100 Gross per 24 hour  Intake 537.35 ml  Output 1150 ml  Net -612.65 ml   Last 3 Weights 04/13/2018 04/12/2018 03/14/2018  Weight (lbs) 208 lb 6.4 oz 207 lb 14.4 oz 203 lb  Weight (kg) 94.53 kg 94.303 kg 92.08 kg      Telemetry    Sinus rhythm - Personally Reviewed  ECG    SR, PACs, IVCD, 1st degree block - Personally Reviewed  Physical Exam   GEN: No acute distress. Resting comfortably in bed Neck: JVD at low neck sitting at 60 degress Cardiac: RRR with occasional early  beat, no murmurs, rubs, or gallops.  Respiratory: Clear to auscultation bilaterally. GI: Soft, nontender, non-distended  MS: trace edema; No deformity. Neuro:  Nonfocal  Psych: Normal affect   Labs    Chemistry Recent Labs  Lab 04/12/18 0235 04/12/18 0552 04/12/18 1244 04/13/18 0304  NA 141  --  139 142  K 3.1*  --  3.5 4.1  CL 98  --  103 107  CO2 30  --  25 29  GLUCOSE 112*  --  107* 109*  BUN 13  --  14 12  CREATININE 1.44*  --  1.16* 1.01*  CALCIUM 8.6*  --  8.5* 8.7*  PROT  --  6.2*  --   --   ALBUMIN  --  3.0*  --   --   AST  --  29  --   --   ALT  --  11  --   --   ALKPHOS  --  54  --   --   BILITOT  --  1.3*  --   --   GFRNONAA 37*  --  48* 57*  GFRAA 43*  --  56* >  [place]  --  11 6     Hematology Recent Labs  Lab 04/11/18 1203 04/11/18 1715 04/13/18 0304  WBC 3.5* 3.8* 3.1*  RBC 3.99 3.99 3.53*  HGB 8.8* 9.0* 7.7*  HCT 30.5* 31.5* 27.5*  MCV 76.4* 78.9* 77.9*  MCH 22.1* 22.6* 21.8*  MCHC 28.9* 28.6* 28.0*  RDW 15.6* 18.3* 18.2*  PLT 79* PLATELET CLUMPS NOTED ON SMEAR, UNABLE TO ESTIMATE 63*    Cardiac Enzymes Recent Labs  Lab 04/12/18 0552  TROPONINI <0.03    Recent Labs  Lab 04/11/18 2115  TROPIPOC 0.01     BNP Recent Labs  Lab 04/11/18 1715  BNP 159.6*     DDimer No results for input(s): DDIMER in the last 168 hours.   Radiology    Dg Chest 2 View  Result Date: 04/11/2018 CLINICAL DATA:  68 year old female with a history chest pain and shortness of breath EXAM: CHEST - 2 VIEW COMPARISON:  09/12/2007, 05/24/2017 FINDINGS: Cardiomediastinal silhouette unchanged in size and contour. Surgical changes of median sternotomy and CABG. Interstitial opacities of the lungs. Redemonstration linear opacity left lung. No pneumothorax or pleural effusion. No new confluent airspace disease. Osteopenia with no displaced fracture. Changes of prior augmentation lower thoracic vertebral body IMPRESSION: Chronic lung changes without evidence  of acute cardiopulmonary disease. Cardiomegaly with surgical changes of median sternotomy and CABG. Electronically Signed   By: Corrie Mckusick D.O.   On: 04/11/2018 18:48    Cardiac Studies   Echo 11/15/17 - Left ventricle: The cavity size was normal. Wall thickness was   normal. Systolic function was mildly to moderately reduced. The   estimated ejection fraction was in the range of 40% to 45%.   Inferior and lateral hypokinesis. Doppler parameters are   consistent with pseudonormal left ventricular relaxation (grade 2   diastolic dysfunction). The E/e&' ratio is >15, suggesting   elevated LV filling pressure. - Left atrium: The atrium was mildly dilated. - Tricuspid valve: There was trivial regurgitation. - Pulmonary arteries: PA peak pressure: 37 mm Hg (S). - Inferior vena cava: The vessel was dilated. The respirophasic   diameter changes were blunted (< 50%), consistent with elevated   central venous pressure.  Impressions: - Compared to a prior study in 04/2017, the LVEF is lower at 40-45%   with inferior and lateral wall hypokinesis, grade 2 DD and   elevated LV filling pressure.  Patient Profile     68 y.o. female with PMH chronic systolic heart failure (EF 40-45%), CAD s/p CABG 2001 with multiple PCI, cirrhosis with esophageal varices, report of atrial fibrillation not on anticoagulation 2/2 bleeding risk who presented with worsening dyspnea for three days.  Assessment & Plan    Acute on chronic systolic and diastolic heart failure: 9 lb weight gain, 3 days of worsening dyspnea and LE edema with orthopnea.  -no improvement with extra torsemide dose at home -increased IV lasix from 80 to 120 IV, but has had difficulty with IV access. Will trial oral torsemide and see if adequate urine output -creatinine improved to 1.01 from 1.44. -following electrolytes -continue home irbesartan 150 gm daily -continue home metoprolol succinate 25 mg daily -weight in Dr. Antionette Char office in  10/2017 was 92.3 kg, she is 94.5 today.  -has had intermittent low BP, continue to monitor.  Anemia: has chronic anemia, baseline Hgb around 9, due to chronic disease. This AM, was 7.7. No clear blood loss, but has history of cirrhosis, monitor for GI blood loss.  Unclear if this is variation or true drop in Hgb. Recheck this evening. Chronic thrombocytopenia as well, platelets 63 today.  Hypokalemia: resolved today, K 4.1.  CAD s/p CABG and multiple PCI: -no aspirin given high bleeding risk -continue home atorvastatin, ezetimibe -continue home imdur -troponins have been negative, unlikely that ischemia is cause of chest pain.  For questions or updates, please contact [place] Please consult [email or web address] for contact info under    Signed, Buford Dresser, MD  04/13/2018, 12:20 PM

## 2018-04-14 DIAGNOSIS — I4819 Other persistent atrial fibrillation: Secondary | ICD-10-CM

## 2018-04-14 DIAGNOSIS — K7460 Unspecified cirrhosis of liver: Secondary | ICD-10-CM

## 2018-04-14 LAB — BASIC METABOLIC PANEL
Anion gap: 6 (ref 5–15)
BUN: 12 mg/dL (ref 8–23)
CO2: 27 mmol/L (ref 22–32)
Calcium: 8.9 mg/dL (ref 8.9–10.3)
Chloride: 107 mmol/L (ref 98–111)
Creatinine, Ser: 1.1 mg/dL — ABNORMAL HIGH (ref 0.44–1.00)
GFR calc Af Amer: 59 mL/min — ABNORMAL LOW (ref >60)
GFR calc non Af Amer: 51 mL/min — ABNORMAL LOW (ref >60)
Glucose, Bld: 99 mg/dL (ref 70–99)
Potassium: 3.9 mmol/L (ref 3.5–5.1)
Sodium: 140 mmol/L (ref 135–145)

## 2018-04-14 LAB — CBC
HCT: 26.7 % — ABNORMAL LOW (ref 36.0–46.0)
Hemoglobin: 7.7 g/dL — ABNORMAL LOW (ref 12.0–15.0)
MCH: 22.3 pg — ABNORMAL LOW (ref 26.0–34.0)
MCHC: 28.8 g/dL — ABNORMAL LOW (ref 30.0–36.0)
MCV: 77.4 fL — ABNORMAL LOW (ref 80.0–100.0)
Platelets: 57 10*3/uL — ABNORMAL LOW (ref 150–400)
RBC: 3.45 MIL/uL — ABNORMAL LOW (ref 3.87–5.11)
RDW: 18.5 % — ABNORMAL HIGH (ref 11.5–15.5)
WBC: 2.5 10*3/uL — ABNORMAL LOW (ref 4.0–10.5)
nRBC: 0 % (ref 0.0–0.2)

## 2018-04-14 NOTE — Discharge Summary (Signed)
Discharge Summary    Patient ID: Samantha [name] MRN: [id number]; DOB: Mar 12, 1950  Admit date: 04/11/2018 Discharge date: 04/14/2018  Primary Care Provider: [name], MD  Primary Cardiologist: Sherren Mocha, MD  Primary Electrophysiologist:  None   Discharge Diagnoses    Principal Problem:   Acute on chronic combined systolic and diastolic congestive heart failure [facility]) Active Problems:   Cirrhosis, non-alcoholic (HCC)   Persistent atrial fibrillation   Allergies Allergies  Allergen Reactions  . Entresto [Sacubitril-Valsartan] Swelling    Swelling of eyes  . Acetaminophen Other (See Comments)    Liver problems  . Oxycodone Itching and Nausea Only  . Ace Inhibitors Other (See Comments)    UNSURE OF REACTION TYPE  . Cefaclor Itching and Rash  . Cephalexin Itching and Rash  . Penicillins Rash    Has patient had a PCN reaction causing immediate rash, facial/tongue/throat swelling, SOB or lightheadedness with hypotension: YES Has patient had a PCN reaction causing severe rash involving mucus membranes or skin necrosis: NO Has patient had a PCN reaction that required hospitalization: YES Has patient had a PCN reaction occurring within the last 10 years: NO If all of the above answers are "NO", then may proceed with Cephalosporin use.   . Pregabalin Other (See Comments)    Retains fluid  . Tape Itching and Rash    Takes skin right off with medical tape--PAPER TAPE ONLY Other reaction(s): Itching Takes skin right off with medical tape--PAPER TAPE ONLY Takes skin right off with medical tape--PAPER TAPE ONLY    Diagnostic Studies/Procedures    None _____________   History of Present Illness     Samantha [name] is a 68 y.o. female with history of chronic systolic and diastolic heart failure, EF 40-45%, CAD status post CABG 2001 and multiple PCI, non alcoholic cirrhosis, esophageal varices, persistent atrial fibrillation not on anticoagulation due to high bleeding risk  who presented with dyspnea for three days and nine pounds weight gain. She had overall just not felt well, had more shortness of breath while exerting herself. Also endorsed worsening LE edema and orthopnea. She had attempted to take extra 20 gm doses of torsemide, but this did not improve her symptoms.     Hospital Course     Consultants: none  Ms. Medders was admitted for an acute on chronic systolic and diastolic heart failure exacerbation. She was diuresed with a weight change from 94.5 kg at her peak to 89 kg at discharge. Her orthopnea was resolved, and her lower extremity edema improved. She is currently under her recent weight from the office, which was 92.3 kg.  Of note, she has known pancytopenia, and her Hgb was low but stable at 7.7. No frank bleeding was noted. She feels that she has been progressing towards needing a transfusion, but she would like to talk to her primary GI physician at her upcoming appointment first.  She was in rate controlled atrial fibrillation throughout her hospitalization, but she is not on anticoagulation due to her chronic anemia and thrombocytopenia.   She did have some transient hypotension of unclear etiology, but this was resolved on the day of discharge, so no medications were changed. She was instructed to check her blood pressure at home and call the office if she saw systolic BP numbers of 409 or less.  She had atypical chest pain, but her troponins were negative, and discomfort improved with diuresis.  She was re-educated on heart failure management, daily weights, fluid and sodium  restrictions. _____________  Discharge Vitals Blood pressure (!) 111/59, pulse 80, temperature 98.3 F (36.8 C), temperature source Oral, resp. rate 18, weight 89 kg, last menstrual period 03/29/2011, SpO2 92 %.  Filed Weights   04/12/18 1530 04/13/18 0106 04/14/18 0453  Weight: 94.3 kg 94.5 kg 89 kg    Labs & Radiologic Studies    CBC Recent Labs     04/13/18 0304 04/14/18 0716  WBC 3.1* 2.5*  HGB 7.7* 7.7*  HCT 27.5* 26.7*  MCV 77.9* 77.4*  PLT 63* 57*   Basic Metabolic Panel Recent Labs    04/13/18 0304 04/14/18 0716  NA 142 140  K 4.1 3.9  CL 107 107  CO2 29 27  GLUCOSE 109* 99  BUN 12 12  CREATININE 1.01* 1.10*  CALCIUM 8.7* 8.9   Liver Function Tests Recent Labs    04/12/18 0552  AST 29  ALT 11  ALKPHOS 54  BILITOT 1.3*  PROT 6.2*  ALBUMIN 3.0*   No results for input(s): LIPASE, AMYLASE in the last 72 hours. Cardiac Enzymes Recent Labs    04/12/18 0552  TROPONINI <0.03   BNP Invalid input(s): POCBNP D-Dimer No results for input(s): DDIMER in the last 72 hours. Hemoglobin A1C No results for input(s): HGBA1C in the last 72 hours. Fasting Lipid Panel No results for input(s): CHOL, HDL, LDLCALC, TRIG, CHOLHDL, LDLDIRECT in the last 72 hours. Thyroid Function Tests Recent Labs    04/12/18 0552  TSH 1.594   _____________  Dg Chest 2 View  Result Date: 04/11/2018 CLINICAL DATA:  68 year old female with a history chest pain and shortness of breath EXAM: CHEST - 2 VIEW COMPARISON:  09/12/2007, 05/24/2017 FINDINGS: Cardiomediastinal silhouette unchanged in size and contour. Surgical changes of median sternotomy and CABG. Interstitial opacities of the lungs. Redemonstration linear opacity left lung. No pneumothorax or pleural effusion. No new confluent airspace disease. Osteopenia with no displaced fracture. Changes of prior augmentation lower thoracic vertebral body IMPRESSION: Chronic lung changes without evidence of acute cardiopulmonary disease. Cardiomegaly with surgical changes of median sternotomy and CABG. Electronically Signed   By: Corrie Mckusick D.O.   On: 04/11/2018 18:48   Disposition   Pt is being discharged home today in good condition.  Follow-up Plans & Appointments   We have requested a closer follow up visit at the [facility] office. They will call you to schedule  this.   Discharge Medications   Allergies as of 04/14/2018      Reactions   Entresto [sacubitril-valsartan] Swelling   Swelling of eyes   Acetaminophen Other (See Comments)   Liver problems   Oxycodone Itching, Nausea Only   Ace Inhibitors Other (See Comments)   UNSURE OF REACTION TYPE   Cefaclor Itching, Rash   Cephalexin Itching, Rash   Penicillins Rash   Has patient had a PCN reaction causing immediate rash, facial/tongue/throat swelling, SOB or lightheadedness with hypotension: YES Has patient had a PCN reaction causing severe rash involving mucus membranes or skin necrosis: NO Has patient had a PCN reaction that required hospitalization: YES Has patient had a PCN reaction occurring within the last 10 years: NO If all of the above answers are "NO", then may proceed with Cephalosporin use.   Pregabalin Other (See Comments)   Retains fluid   Tape Itching, Rash   Takes skin right off with medical tape--PAPER TAPE ONLY Other reaction(s): Itching Takes skin right off with medical tape--PAPER TAPE ONLY Takes skin right off with medical tape--PAPER TAPE ONLY  Medication List    TAKE these medications   albuterol 108 (90 Base) MCG/ACT inhaler Commonly known as:  PROVENTIL HFA;VENTOLIN HFA Inhale 2 puffs into the lungs every 6 (six) hours as needed for wheezing or shortness of breath.   atorvastatin 20 MG tablet Commonly known as:  LIPITOR Take 20 mg by mouth every evening.   calcium carbonate 500 MG chewable tablet Commonly known as:  TUMS - dosed in mg elemental calcium Chew 1 tablet by mouth daily as needed for indigestion or heartburn.   cetirizine 10 MG tablet Commonly known as:  ZYRTEC Take 10 mg by mouth daily as needed for allergies (during allergy season).   ezetimibe 10 MG tablet Commonly known as:  ZETIA Take 10 mg by mouth at bedtime.   FLUoxetine 40 MG capsule Commonly known as:  PROZAC Take 40 mg by mouth daily.   fluticasone 50 MCG/ACT nasal  spray Commonly known as:  FLONASE Place 1 spray into both nostrils daily as needed for allergies.   HYDROcodone-acetaminophen 10-325 MG tablet Commonly known as:  NORCO Take 1 tablet by mouth as needed for pain.   isosorbide mononitrate 60 MG 24 hr tablet Commonly known as:  IMDUR Take 1.5 tablets (90 mg total) by mouth daily.   metoprolol succinate 25 MG 24 hr tablet Commonly known as:  TOPROL XL Take 1 tablet (25 mg total) by mouth daily.   NITROSTAT 0.4 MG SL tablet Generic drug:  nitroGLYCERIN Place 0.4 mg under the tongue every 5 (five) minutes as needed for chest pain (x 3 tabs daily).   pantoprazole 40 MG tablet Commonly known as:  PROTONIX Take 40 mg by mouth daily.   polyethylene glycol packet Commonly known as:  MIRALAX / GLYCOLAX Take 17 g by mouth daily.   torsemide 20 MG tablet Commonly known as:  DEMADEX Take 2 tablets (40 mg total) by mouth daily.   valsartan 160 MG tablet Commonly known as:  DIOVAN Take 1 tablet (160 mg total) by mouth at bedtime.        Acute coronary syndrome (MI, NSTEMI, STEMI, etc) this admission?: No.    Outstanding Labs/Studies   None  Duration of Discharge Encounter   Greater than 30 minutes physician time.  Signed, Buford Dresser, MD 04/14/2018, 11:50 AM

## 2018-04-16 ENCOUNTER — Other Ambulatory Visit (INDEPENDENT_AMBULATORY_CARE_PROVIDER_SITE_OTHER): Payer: Self-pay | Admitting: *Deleted

## 2018-04-16 ENCOUNTER — Telehealth: Payer: Self-pay | Admitting: Cardiovascular Disease

## 2018-04-16 DIAGNOSIS — D649 Anemia, unspecified: Secondary | ICD-10-CM

## 2018-04-16 DIAGNOSIS — K7460 Unspecified cirrhosis of liver: Secondary | ICD-10-CM

## 2018-04-16 NOTE — Telephone Encounter (Signed)
New message    Per Buford Dresser scheduled a TOC Appointment with Melina Copa on 04/26/2018 at 9:00 am.

## 2018-04-16 NOTE — Telephone Encounter (Signed)
**Note De-Identified  Obfuscation** 1st Attempt: I left a message on the pts VM asking her to call me back.

## 2018-04-17 ENCOUNTER — Emergency Department (HOSPITAL_COMMUNITY): Payer: Medicare Other

## 2018-04-17 ENCOUNTER — Telehealth: Payer: Self-pay | Admitting: *Deleted

## 2018-04-17 ENCOUNTER — Inpatient Hospital Stay (HOSPITAL_COMMUNITY)
Admission: EM | Admit: 2018-04-17 | Discharge: 2018-04-20 | DRG: 291 | Disposition: A | Discharge status: Home / Self Care | Payer: Medicare Other | Attending: Family Medicine | Admitting: Family Medicine

## 2018-04-17 ENCOUNTER — Ambulatory Visit: Payer: Medicare Other | Admitting: Physician Assistant

## 2018-04-17 ENCOUNTER — Other Ambulatory Visit: Payer: Self-pay

## 2018-04-17 ENCOUNTER — Encounter: Payer: Self-pay | Admitting: Physician Assistant

## 2018-04-17 DIAGNOSIS — E669 Obesity, unspecified: Secondary | ICD-10-CM | POA: Diagnosis present

## 2018-04-17 DIAGNOSIS — D61818 Other pancytopenia: Secondary | ICD-10-CM | POA: Diagnosis not present

## 2018-04-17 DIAGNOSIS — K7460 Unspecified cirrhosis of liver: Secondary | ICD-10-CM | POA: Diagnosis not present

## 2018-04-17 DIAGNOSIS — I5023 Acute on chronic systolic (congestive) heart failure: Secondary | ICD-10-CM | POA: Diagnosis not present

## 2018-04-17 DIAGNOSIS — Z8249 Family history of ischemic heart disease and other diseases of the circulatory system: Secondary | ICD-10-CM | POA: Diagnosis not present

## 2018-04-17 DIAGNOSIS — K644 Residual hemorrhoidal skin tags: Secondary | ICD-10-CM | POA: Diagnosis present

## 2018-04-17 DIAGNOSIS — Z951 Presence of aortocoronary bypass graft: Secondary | ICD-10-CM | POA: Diagnosis not present

## 2018-04-17 DIAGNOSIS — I4891 Unspecified atrial fibrillation: Secondary | ICD-10-CM

## 2018-04-17 DIAGNOSIS — I864 Gastric varices: Secondary | ICD-10-CM | POA: Diagnosis present

## 2018-04-17 DIAGNOSIS — F329 Major depressive disorder, single episode, unspecified: Secondary | ICD-10-CM | POA: Diagnosis present

## 2018-04-17 DIAGNOSIS — I8510 Secondary esophageal varices without bleeding: Secondary | ICD-10-CM | POA: Diagnosis present

## 2018-04-17 DIAGNOSIS — R0602 Shortness of breath: Secondary | ICD-10-CM | POA: Diagnosis not present

## 2018-04-17 DIAGNOSIS — I472 Ventricular tachycardia: Secondary | ICD-10-CM | POA: Diagnosis not present

## 2018-04-17 DIAGNOSIS — E785 Hyperlipidemia, unspecified: Secondary | ICD-10-CM | POA: Diagnosis not present

## 2018-04-17 DIAGNOSIS — R002 Palpitations: Secondary | ICD-10-CM | POA: Diagnosis not present

## 2018-04-17 DIAGNOSIS — Z87891 Personal history of nicotine dependence: Secondary | ICD-10-CM

## 2018-04-17 DIAGNOSIS — Z6836 Body mass index (BMI) 36.0-36.9, adult: Secondary | ICD-10-CM

## 2018-04-17 DIAGNOSIS — I5042 Chronic combined systolic (congestive) and diastolic (congestive) heart failure: Secondary | ICD-10-CM

## 2018-04-17 DIAGNOSIS — K219 Gastro-esophageal reflux disease without esophagitis: Secondary | ICD-10-CM | POA: Diagnosis present

## 2018-04-17 DIAGNOSIS — K7581 Nonalcoholic steatohepatitis (NASH): Secondary | ICD-10-CM | POA: Diagnosis present

## 2018-04-17 DIAGNOSIS — N183 Chronic kidney disease, stage 3 (moderate): Secondary | ICD-10-CM | POA: Diagnosis present

## 2018-04-17 DIAGNOSIS — J984 Other disorders of lung: Secondary | ICD-10-CM | POA: Diagnosis not present

## 2018-04-17 DIAGNOSIS — Z79899 Other long term (current) drug therapy: Secondary | ICD-10-CM | POA: Diagnosis not present

## 2018-04-17 DIAGNOSIS — D649 Anemia, unspecified: Secondary | ICD-10-CM

## 2018-04-17 DIAGNOSIS — I130 Hypertensive heart and chronic kidney disease with heart failure and stage 1 through stage 4 chronic kidney disease, or unspecified chronic kidney disease: Principal | ICD-10-CM | POA: Diagnosis present

## 2018-04-17 DIAGNOSIS — I4819 Other persistent atrial fibrillation: Secondary | ICD-10-CM | POA: Diagnosis not present

## 2018-04-17 DIAGNOSIS — I2510 Atherosclerotic heart disease of native coronary artery without angina pectoris: Secondary | ICD-10-CM | POA: Diagnosis not present

## 2018-04-17 DIAGNOSIS — E876 Hypokalemia: Secondary | ICD-10-CM | POA: Diagnosis not present

## 2018-04-17 DIAGNOSIS — J9601 Acute respiratory failure with hypoxia: Secondary | ICD-10-CM | POA: Diagnosis present

## 2018-04-17 DIAGNOSIS — D509 Iron deficiency anemia, unspecified: Secondary | ICD-10-CM | POA: Diagnosis not present

## 2018-04-17 DIAGNOSIS — Z8701 Personal history of pneumonia (recurrent): Secondary | ICD-10-CM

## 2018-04-17 DIAGNOSIS — I255 Ischemic cardiomyopathy: Secondary | ICD-10-CM | POA: Diagnosis not present

## 2018-04-17 DIAGNOSIS — K5909 Other constipation: Secondary | ICD-10-CM | POA: Diagnosis present

## 2018-04-17 DIAGNOSIS — I5043 Acute on chronic combined systolic (congestive) and diastolic (congestive) heart failure: Secondary | ICD-10-CM | POA: Diagnosis not present

## 2018-04-17 DIAGNOSIS — I509 Heart failure, unspecified: Secondary | ICD-10-CM

## 2018-04-17 DIAGNOSIS — Z90710 Acquired absence of both cervix and uterus: Secondary | ICD-10-CM

## 2018-04-17 DIAGNOSIS — I25119 Atherosclerotic heart disease of native coronary artery with unspecified angina pectoris: Secondary | ICD-10-CM | POA: Diagnosis present

## 2018-04-17 DIAGNOSIS — K921 Melena: Secondary | ICD-10-CM | POA: Diagnosis not present

## 2018-04-17 DIAGNOSIS — I10 Essential (primary) hypertension: Secondary | ICD-10-CM | POA: Diagnosis not present

## 2018-04-17 HISTORY — DX: Chronic kidney disease, stage 3 unspecified: N18.30

## 2018-04-17 HISTORY — DX: Other pancytopenia: D61.818

## 2018-04-17 HISTORY — DX: Chronic kidney disease, stage 3 (moderate): N18.3

## 2018-04-17 HISTORY — DX: Chronic combined systolic (congestive) and diastolic (congestive) heart failure: I50.42

## 2018-04-17 LAB — CBC
HCT: 29.5 % — ABNORMAL LOW (ref 36.0–46.0)
Hemoglobin: 8.3 g/dL — ABNORMAL LOW (ref 12.0–15.0)
MCH: 22.6 pg — ABNORMAL LOW (ref 26.0–34.0)
MCHC: 28.1 g/dL — ABNORMAL LOW (ref 30.0–36.0)
MCV: 80.2 fL (ref 80.0–100.0)
Platelets: 64 10*3/uL — ABNORMAL LOW (ref 150–400)
RBC: 3.68 MIL/uL — ABNORMAL LOW (ref 3.87–5.11)
RDW: 18.6 % — ABNORMAL HIGH (ref 11.5–15.5)
WBC: 3 10*3/uL — ABNORMAL LOW (ref 4.0–10.5)
nRBC: 0 % (ref 0.0–0.2)

## 2018-04-17 LAB — BASIC METABOLIC PANEL
Anion gap: 9 (ref 5–15)
BUN: 9 mg/dL (ref 8–23)
CO2: 25 mmol/L (ref 22–32)
Calcium: 9 mg/dL (ref 8.9–10.3)
Chloride: 106 mmol/L (ref 98–111)
Creatinine, Ser: 0.95 mg/dL (ref 0.44–1.00)
GFR calc Af Amer: >60 mL/min (ref >60)
GFR calc non Af Amer: >60 mL/min (ref >60)
Glucose, Bld: 163 mg/dL — ABNORMAL HIGH (ref 70–99)
Potassium: 4.1 mmol/L (ref 3.5–5.1)
Sodium: 140 mmol/L (ref 135–145)

## 2018-04-17 LAB — ABO/RH: ABO/RH(D): O POS

## 2018-04-17 LAB — I-STAT TROPONIN, ED: Troponin i, poc: 0.01 ng/mL (ref 0.00–0.08)

## 2018-04-17 LAB — BRAIN NATRIURETIC PEPTIDE: B Natriuretic Peptide: 140.2 pg/mL — ABNORMAL HIGH (ref 0.0–100.0)

## 2018-04-17 LAB — PREPARE RBC (CROSSMATCH)

## 2018-04-17 MED ORDER — IPRATROPIUM-ALBUTEROL 0.5-2.5 (3) MG/3ML IN SOLN
3.0000 mL | Freq: Two times a day (BID) | RESPIRATORY_TRACT | Status: DC
  Administered 2018-04-18 – 2018-04-20 (×5): 3 mL via RESPIRATORY_TRACT
  Filled 2018-04-17 (×6): qty 3

## 2018-04-17 MED ORDER — EZETIMIBE 10 MG PO TABS
10.0000 mg | ORAL_TABLET | Freq: Every day | ORAL | Status: DC
  Administered 2018-04-17 – 2018-04-19 (×3): 10 mg via ORAL
  Filled 2018-04-17 (×3): qty 1

## 2018-04-17 MED ORDER — IPRATROPIUM-ALBUTEROL 0.5-2.5 (3) MG/3ML IN SOLN
3.0000 mL | Freq: Four times a day (QID) | RESPIRATORY_TRACT | Status: DC
  Administered 2018-04-17: 3 mL via RESPIRATORY_TRACT
  Filled 2018-04-17: qty 3

## 2018-04-17 MED ORDER — SODIUM CHLORIDE 0.9% FLUSH: 3.0000 mL | Freq: Once | INTRAVENOUS | Status: DC

## 2018-04-17 MED ORDER — AZITHROMYCIN 250 MG PO TABS
500.0000 mg | ORAL_TABLET | Freq: Every day | ORAL | Status: DC
  Administered 2018-04-17 – 2018-04-18 (×2): 500 mg via ORAL
  Filled 2018-04-17 (×2): qty 2

## 2018-04-17 MED ORDER — FLUOXETINE HCL 20 MG PO CAPS
40.0000 mg | ORAL_CAPSULE | Freq: Every day | ORAL | Status: DC
  Administered 2018-04-18 – 2018-04-20 (×3): 40 mg via ORAL
  Filled 2018-04-17 (×4): qty 2

## 2018-04-17 MED ORDER — TOBRAMYCIN-DEXAMETHASONE 0.3-0.1 % OP SUSP
1.0000 [drp] | Freq: Four times a day (QID) | OPHTHALMIC | Status: DC
  Administered 2018-04-17 – 2018-04-20 (×12): 1 [drp] via OPHTHALMIC
  Filled 2018-04-17: qty 2.5

## 2018-04-17 MED ORDER — METOPROLOL SUCCINATE ER 25 MG PO TB24
25.0000 mg | ORAL_TABLET | Freq: Every day | ORAL | Status: DC
  Administered 2018-04-18 – 2018-04-20 (×3): 25 mg via ORAL
  Filled 2018-04-17 (×3): qty 1

## 2018-04-17 MED ORDER — ISOSORBIDE MONONITRATE ER 60 MG PO TB24
90.0000 mg | ORAL_TABLET | Freq: Every day | ORAL | Status: DC
  Administered 2018-04-18 – 2018-04-20 (×3): 90 mg via ORAL
  Filled 2018-04-17 (×3): qty 1

## 2018-04-17 MED ORDER — ONDANSETRON HCL 4 MG PO TABS: 4.0000 mg | ORAL_TABLET | Freq: Four times a day (QID) | ORAL | Status: DC | PRN

## 2018-04-17 MED ORDER — SODIUM CHLORIDE 0.9% FLUSH: 3.0000 mL | INTRAVENOUS | Status: DC | PRN

## 2018-04-17 MED ORDER — SODIUM CHLORIDE 0.9% FLUSH
3.0000 mL | Freq: Two times a day (BID) | INTRAVENOUS | Status: DC
  Administered 2018-04-17 – 2018-04-20 (×6): 3 mL via INTRAVENOUS

## 2018-04-17 MED ORDER — FUROSEMIDE 10 MG/ML IJ SOLN
40.0000 mg | Freq: Two times a day (BID) | INTRAMUSCULAR | Status: DC
  Administered 2018-04-17 – 2018-04-20 (×6): 40 mg via INTRAVENOUS
  Filled 2018-04-17 (×5): qty 4

## 2018-04-17 MED ORDER — FUROSEMIDE 10 MG/ML IJ SOLN
40.0000 mg | Freq: Once | INTRAMUSCULAR | Status: AC
  Administered 2018-04-17: 40 mg via INTRAVENOUS
  Filled 2018-04-17: qty 4

## 2018-04-17 MED ORDER — CALCIUM CARBONATE ANTACID 500 MG PO CHEW: 1.0000 | CHEWABLE_TABLET | Freq: Every day | ORAL | Status: DC | PRN

## 2018-04-17 MED ORDER — SODIUM CHLORIDE 0.9 % IV SOLN: 250.0000 mL | INTRAVENOUS | Status: DC | PRN

## 2018-04-17 MED ORDER — PANTOPRAZOLE SODIUM 40 MG PO TBEC
40.0000 mg | DELAYED_RELEASE_TABLET | Freq: Every day | ORAL | Status: DC
  Administered 2018-04-17 – 2018-04-20 (×4): 40 mg via ORAL
  Filled 2018-04-17 (×4): qty 1

## 2018-04-17 MED ORDER — IBUPROFEN 800 MG PO TABS
800.0000 mg | ORAL_TABLET | Freq: Once | ORAL | Status: AC
  Administered 2018-04-17: 800 mg via ORAL
  Filled 2018-04-17: qty 1

## 2018-04-17 MED ORDER — ONDANSETRON HCL 4 MG/2ML IJ SOLN: 4.0000 mg | Freq: Four times a day (QID) | INTRAMUSCULAR | Status: DC | PRN

## 2018-04-17 MED ORDER — SODIUM CHLORIDE 0.9% IV SOLUTION: Freq: Once | INTRAVENOUS | Status: DC

## 2018-04-17 MED ORDER — NITROGLYCERIN 0.4 MG SL SUBL: 0.4000 mg | SUBLINGUAL_TABLET | SUBLINGUAL | Status: DC | PRN

## 2018-04-17 MED ORDER — ATORVASTATIN CALCIUM 10 MG PO TABS
20.0000 mg | ORAL_TABLET | Freq: Every evening | ORAL | Status: DC
  Administered 2018-04-17 – 2018-04-19 (×3): 20 mg via ORAL
  Filled 2018-04-17 (×3): qty 2

## 2018-04-17 NOTE — ED Provider Notes (Addendum)
[facility] Provider Note   CSN: [id number] Arrival date & time: 04/17/18  1214    History   Chief Complaint Chief Complaint  Patient presents with  . Shortness of Breath  . Anemia    HPI Samantha [name] is a 68 y.o. female.     68 y.o female with a PMH of CHF, Anemia, CKD presents to the ED with a chief complaint of shortness of breath x a few days. Patient was recently seen in the ED and admitted for acute exacerbation of heart failure, she returns today sent in by Cardiology PA due to worsening shortness of breath. Patient reports she is having shortness of breath at rest and worse with sitting up. She reports being diuresed while in the hospital because of her swelling. She states gasping for air with walking and tying her shoes. She also endorses a headache along with swollen eyes. Patient was told her Hgb was 7.7 during her last visit and needed to be seen in the ED.       Past Medical History:  Diagnosis Date  . Chronic combined systolic and diastolic CHF (congestive heart failure) ([place])   . Cirrhosis of liver ([place])   . CKD (chronic kidney disease), stage III ([place])   . Coronary artery disease    a. s/p CABG 2001 w/ (LIMA-OM, SVG-D1, SVG-RCA). b. h/o multiple PCIs per Dr. Antionette Char note.  . Depression   . Esophageal varices ([place])    New 2013  . Gastroesophageal reflux disease   . History of pneumonia   . Hyperlipidemia   . Hypertension   . Obesity   . Osteoarthritis   . Pancytopenia ([place])   . Paroxysmal atrial flutter ([place])    a. dx 05/2016.  [name] Persistent atrial fibrillation    a. reported in hosp 07/2016, not on anticoag due to cirrhosis and liver disease, low platelets, varices.  . Thrombocytopenia [facility])     Patient Active Problem List   Diagnosis Date Noted  . Chronic combined systolic and diastolic CHF (congestive heart failure) ([place]) 04/17/2018  . Acute on chronic heart failure ([place]) 04/11/2018  . Rectal pain  03/06/2017  . Other cirrhosis of liver ([place]) 10/26/2016  . Idiopathic esophageal varices without bleeding ([place]) 10/26/2016  . Persistent atrial fibrillation 08/16/2016  . Cardiomyopathy, ischemic-35-40% by cath  01/11/2015  . Acute on chronic combined systolic and diastolic congestive heart failure ([place]) 01/11/2015  . Obesity 09/24/2013  . Unspecified hereditary and idiopathic peripheral neuropathy 01/10/2013  . Cirrhosis, non-alcoholic ([place]) 09/47/0962  . Esophageal varices- Plavix stopped 12/23/2011  . GI bleed 12/23/2011  . Anemia 04/22/2011  . CAD (coronary artery disease) 08/21/2009  . Mixed hyperlipidemia 06/04/2008  . Essential hypertension 06/04/2008  . S/P CABG x 3- 2001 06/04/2008    Past Surgical History:  Procedure Laterality Date  . ABDOMINAL HYSTERECTOMY    . BACK SURGERY    . CARDIAC CATHETERIZATION  2004   left internal mammary artery to the obtuse marginal  was found to be small and thread like.  The two grafts were patent.  The left circumflex had 90% in-stent restenosis and cutting balloon angioplasty was performed followed by placement of a 3.0 x 40mm Taxus drug -eluting stent.    [name] CARDIAC CATHETERIZATION  2006   There was in-stent restenosis in the left circumflex and this was treated with cutting balloon angioplasty   . CARDIAC CATHETERIZATION  2008   vein graft to the to the obtuse marginal  was patent, although small, left circumflex had 40% in-stent restenosis, ejection fraction 40-45%.  The patient was medically mananged.  [name] CARDIAC CATHETERIZATION N/A 01/09/2015   Procedure: Left Heart Cath and Cors/Grafts Angiography;  Surgeon: Belva Crome, MD;  Location: [facility];  Service: Cardiovascular;  Laterality: N/A;  . COLONOSCOPY  03/29/2011   Procedure: COLONOSCOPY;  Surgeon: Jamesetta So, MD;  [facility];  Service: Gastroenterology;  Laterality: N/A;  . CORONARY ARTERY BYPASS GRAFT  May 31,2001   x 3 with a vein graft to the first  diagonal, vein graft to the right coronary  artery, and a free left internal mammary  artery to the obtuse marginal   . ESOPHAGEAL BANDING N/A 07/04/2012   Procedure: ESOPHAGEAL BANDING;  Surgeon: Rogene Houston, MD;  [facility];  Service: Endoscopy;  Laterality: N/A;  . ESOPHAGEAL BANDING N/A 09/17/2012   Procedure: ESOPHAGEAL BANDING;  Surgeon: Rogene Houston, MD;  [facility];  Service: Endoscopy;  Laterality: N/A;  . ESOPHAGEAL BANDING N/A 10/22/2013   Procedure: ESOPHAGEAL BANDING;  Surgeon: Rogene Houston, MD;  [facility];  Service: Endoscopy;  Laterality: N/A;  . ESOPHAGEAL BANDING N/A 11/28/2014   Procedure: ESOPHAGEAL BANDING;  Surgeon: Rogene Houston, MD;  [facility];  Service: Endoscopy;  Laterality: N/A;  . ESOPHAGEAL BANDING N/A 10/29/2015   Procedure: ESOPHAGEAL BANDING;  Surgeon: Rogene Houston, MD;  [facility];  Service: Endoscopy;  Laterality: N/A;  . ESOPHAGOGASTRODUODENOSCOPY  12/20/2011   Procedure: ESOPHAGOGASTRODUODENOSCOPY (EGD);  Surgeon: Jamesetta So, MD;  [facility];  Service: Gastroenterology;  Laterality: N/A;  . ESOPHAGOGASTRODUODENOSCOPY N/A 07/04/2012   Procedure: ESOPHAGOGASTRODUODENOSCOPY (EGD);  Surgeon: Rogene Houston, MD;  [facility];  Service: Endoscopy;  Laterality: N/A;  235-moved to 255 Ann to notify pt  . ESOPHAGOGASTRODUODENOSCOPY N/A 09/17/2012   Procedure: ESOPHAGOGASTRODUODENOSCOPY (EGD);  Surgeon: Rogene Houston, MD;  [facility];  Service: Endoscopy;  Laterality: N/A;  730  . ESOPHAGOGASTRODUODENOSCOPY N/A 10/22/2013   Procedure: ESOPHAGOGASTRODUODENOSCOPY (EGD);  Surgeon: Rogene Houston, MD;  [facility];  Service: Endoscopy;  Laterality: N/A;  730  . ESOPHAGOGASTRODUODENOSCOPY N/A 11/28/2014   Procedure: ESOPHAGOGASTRODUODENOSCOPY (EGD);  Surgeon: Rogene Houston, MD;  [facility];  Service: Endoscopy;  Laterality: N/A;  1:25   . ESOPHAGOGASTRODUODENOSCOPY N/A 10/29/2015   Procedure: ESOPHAGOGASTRODUODENOSCOPY (EGD);  Surgeon: Rogene Houston, MD;  [facility];  Service: Endoscopy;  Laterality: N/A;  12:00  . ESOPHAGOGASTRODUODENOSCOPY N/A 12/12/2016   Procedure: ESOPHAGOGASTRODUODENOSCOPY (EGD);  Surgeon: Rogene Houston, MD;  [facility];  Service: Endoscopy;  Laterality: N/A;  225  . FLEXIBLE SIGMOIDOSCOPY N/A 03/20/2017   Procedure: FLEXIBLE SIGMOIDOSCOPY;  Surgeon: Rogene Houston, MD;  [facility];  Service: Endoscopy;  Laterality: N/A;  7:30  . JOINT REPLACEMENT    . LEFT HEART CATH AND CORS/GRAFTS ANGIOGRAPHY N/A 08/15/2016   Procedure: Left Heart Cath and Cors/Grafts Angiography;  Surgeon: Jettie Booze, MD;  Location: [facility];  Service: Cardiovascular;  Laterality: N/A;  . LEFT HEART CATHETERIZATION WITH CORONARY/GRAFT ANGIOGRAM N/A 12/25/2013   Procedure: LEFT HEART CATHETERIZATION WITH Beatrix Fetters;  Surgeon: Blane Ohara, MD;  Location: [facility] CATH LAB;  Service: Cardiovascular;  Laterality: N/A;  . RIGHT HEART CATH N/A 05/29/2017   Procedure: RIGHT HEART CATH;  Surgeon: Jolaine Artist, MD;  Location: [facility]  INVASIVE CV LAB;  Service: Cardiovascular;  Laterality: N/A;  . rotator cuff left  2007  . TONSILLECTOMY       OB History   No obstetric history on file.      Home Medications    Prior to Admission medications   Medication Sig Start Date End Date Taking? Authorizing Provider  albuterol (PROVENTIL HFA;VENTOLIN HFA) 108 (90 BASE) MCG/ACT inhaler Inhale 2 puffs into the lungs every 6 (six) hours as needed for wheezing or shortness of breath.    Yes [provider]  atorvastatin (LIPITOR) 20 MG tablet Take 20 mg by mouth every evening.    Yes [provider]  calcium carbonate (TUMS - DOSED IN MG ELEMENTAL CALCIUM) 500 MG chewable tablet Chew 1 tablet by mouth daily as needed for indigestion or heartburn.   Yes  [provider]  ezetimibe (ZETIA) 10 MG tablet Take 10 mg by mouth at bedtime.    Yes [provider]  FLUoxetine (PROZAC) 40 MG capsule Take 40 mg by mouth daily.    Yes [provider]  HYDROcodone-acetaminophen (NORCO) 10-325 MG tablet Take 1 tablet by mouth as needed for pain. 11/06/17  Yes [provider]  isosorbide mononitrate (IMDUR) 60 MG 24 hr tablet Take 1.5 tablets (90 mg total) by mouth daily. 11/15/17 11/10/18 Yes Sherren Mocha, MD  metoprolol succinate (TOPROL XL) 25 MG 24 hr tablet Take 1 tablet (25 mg total) by mouth daily. 03/14/18  Yes Weaver, Scott T, PA-C  NITROSTAT 0.4 MG SL tablet Place 0.4 mg under the tongue every 5 (five) minutes as needed for chest pain (x 3 tabs daily).  11/15/13  Yes [provider]  pantoprazole (PROTONIX) 40 MG tablet Take 40 mg by mouth daily.   Yes [provider]  polyethylene glycol (MIRALAX / GLYCOLAX) packet Take 17 g by mouth daily.   Yes [provider]  tobramycin-dexamethasone (TOBRADEX) ophthalmic solution Place 1 drop into both eyes 4 (four) times daily. 04/16/18  Yes [provider]  torsemide (DEMADEX) 20 MG tablet Take 2 tablets (40 mg total) by mouth daily. 11/15/17 11/10/18 Yes Sherren Mocha, MD  valsartan (DIOVAN) 160 MG tablet Take 1 tablet (160 mg total) by mouth at bedtime. 06/15/17 06/10/18 Yes TillerySatira Mccallum, PA-C    Family History Family History  Problem Relation Age of Onset  . Heart attack Mother   . Heart attack Father 66       cause of death  . Coronary artery disease Sister        CABG   . Colon cancer Neg Hx     Social History Social History   Tobacco Use  . Smoking status: Former Smoker    Packs/day: 0.50    Years: 20.00    Pack years: 10.00    Types: Cigarettes    Last attempt to quit: 07/21/1995    Years since quitting: 22.7  . Smokeless tobacco: Never Used  Substance Use Topics  . Alcohol use: No  . Drug use: No      Allergies   Entresto [sacubitril-valsartan]; Acetaminophen; Oxycodone; Ace inhibitors; Cefaclor; Cephalexin; Penicillins; Pregabalin; and Tape   Review of Systems Review of Systems  Constitutional: Negative for chills and fever.  HENT: Negative for ear pain and sore throat.   Eyes: Negative for pain and visual disturbance.  Respiratory: Positive for shortness of breath. Negative for cough.   Cardiovascular: Positive for palpitations. Negative for chest pain.  Gastrointestinal: Negative for abdominal pain and  vomiting.  Genitourinary: Negative for dysuria and hematuria.  Musculoskeletal: Negative for arthralgias and back pain.  Skin: Negative for color change and rash.  Neurological: Negative for seizures and syncope.  All other systems reviewed and are negative.    Physical Exam Updated Vital Signs BP (!) 100/52 (BP Location: Left Arm)   Pulse 82   Temp 98 F (36.7 C) (Oral)   Resp (!) 22   LMP 03/29/2011   SpO2 98%   Physical Exam Vitals signs and nursing note reviewed.  Constitutional:      General: She is not in acute distress.    Appearance: She is well-developed.  HENT:     Head: Normocephalic and atraumatic.     Mouth/Throat:     Pharynx: No oropharyngeal exudate.  Eyes:     Pupils: Pupils are equal, round, and reactive to light.  Neck:     Musculoskeletal: Normal range of motion.  Cardiovascular:     Rate and Rhythm: Regular rhythm.     Heart sounds: Normal heart sounds.  Pulmonary:     Effort: Pulmonary effort is normal. No respiratory distress.     Breath sounds: Examination of the right-upper field reveals wheezing. Examination of the right-middle field reveals wheezing. Wheezing present.  Abdominal:     General: Bowel sounds are normal. There is no distension.     Palpations: Abdomen is soft.     Tenderness: There is no abdominal tenderness.  Musculoskeletal:        General: No tenderness or deformity.     Right lower leg: No edema.     Left  lower leg: No edema.  Skin:    General: Skin is warm and dry.  Neurological:     Mental Status: She is alert and oriented to person, place, and time.      ED Treatments / Results  Labs (all labs ordered are listed, but only abnormal results are displayed) Labs Reviewed  BASIC METABOLIC PANEL - Abnormal; Notable for the following components:      Result Value   Glucose, Bld 163 (*)    All other components within normal limits  CBC - Abnormal; Notable for the following components:   WBC 3.0 (*)    RBC 3.68 (*)    Hemoglobin 8.3 (*)    HCT 29.5 (*)    MCH 22.6 (*)    MCHC 28.1 (*)    RDW 18.6 (*)    Platelets 64 (*)    All other components within normal limits  BRAIN NATRIURETIC PEPTIDE - Abnormal; Notable for the following components:   B Natriuretic Peptide 140.2 (*)    All other components within normal limits  I-STAT TROPONIN, ED  TYPE AND SCREEN  ABO/RH  PREPARE RBC (CROSSMATCH)    EKG EKG Interpretation  Date/Time:  Tuesday April 17 2018 12:20:02 EST Ventricular Rate:  99 PR Interval:    QRS Duration: 144 QT Interval:  422 QTC Calculation: 541 R Axis:   98 Text Interpretation:  Atrial fibrillation Rightward axis Non-specific intra-ventricular conduction block Cannot rule out Anterior infarct , age undetermined T wave abnormality, consider inferior ischemia or digitalis effect Abnormal ECG No significant change since last tracing Confirmed by Deno Etienne [phone]) on 04/17/2018 2:18:49 PM   Radiology Dg Chest 2 View  Result Date: 04/17/2018 CLINICAL DATA:  Dyspnea EXAM: CHEST - 2 VIEW COMPARISON:  04/11/2018 chest radiograph. FINDINGS: Stable configuration of sternotomy wires noting discontinuity in the lower most sternotomy wire. CABG clips overlie the  mediastinum. Stable cardiomediastinal silhouette with cardiomegaly. No pneumothorax. No pleural effusion. Stable platelike scarring in the anterior left mid lung. Hazy opacity in the right middle lobe with new  silhouetting of the right heart border superimposed on chronic scarring. No overt pulmonary edema. IMPRESSION: 1. Hazy right middle lobe opacity superimposed on chronic scarring with new silhouetting of the right heart border, can not exclude right middle lobe pneumonia. Short-term chest radiograph follow-up advised. 2. Stable platelike scarring in the anterior left mid lung. 3. Stable cardiomegaly without overt pulmonary edema. Electronically Signed   By: Ilona Sorrel M.D.   On: 04/17/2018 13:05    Procedures Procedures (including critical care time)  Medications Ordered in ED Medications  sodium chloride flush (NS) 0.9 % injection 3 mL (3 mLs Intravenous Not Given 04/17/18 1426)  0.9 %  sodium chloride infusion (Manually program via Guardrails IV Fluids) (has no administration in time range)  furosemide (LASIX) injection 40 mg (has no administration in time range)  ipratropium-albuterol (DUONEB) 0.5-2.5 (3) MG/3ML nebulizer solution 3 mL (has no administration in time range)     Initial Impression / Assessment and Plan / ED Course  I have reviewed the triage vital signs and the nursing notes.  Pertinent labs & imaging results that were available during my care of the patient were reviewed by me and considered in my medical decision making (see chart for details).   Patient presents with worsening SOB since discharge from the hospital. She attempted to have appointment with cardiology this morning but was sent to the ED for further workup.   She reports worsening symptoms. CBC showed a a hemoglobin of 8.3 which is slightly improved from previous visit. BMP showed no electrolyte abnormality, creatine is within normal limits.  Will place call for cardiology for their recommendations. DG Chest 2 view: 1. Hazy right middle lobe opacity superimposed on chronic scarring  with new silhouetting of the right heart border, can not exclude  right middle lobe pneumonia. Short-term chest radiograph  follow-up  advised.  2. Stable platelike scarring in the anterior left mid lung.  3. Stable cardiomegaly without overt pulmonary edema.      She denies any cough or fevers at this time. 3:25 PM Spoke to cardiology who advised patient be seen in ED, and had no recommendations at this time.  Will transfuse two units at this time and admit patient to hospitalist service.   4:31 PM Spoke to hospitalist Dr. Drinda Butts who will admit patient, she has requested cardiology consult will place call for cardiology at this time.  5:15 PMSpoke to cardiology who will see patient during hospital stay.   Final Clinical Impressions(s) / ED Diagnoses   Final diagnoses:  Symptomatic anemia    ED Discharge Orders    None       Janeece Fitting, PA-C 04/17/18 1631    Janeece Fitting, PA-C 04/17/18 [place], DO 04/18/18 [phone]

## 2018-04-17 NOTE — Progress Notes (Signed)
Pt admitted to the unit from ED; pt arrived via stretcher with blood transfusing to left upper arm IV; pt oriented to the unit and room; fall/safety precaution and prevention education completed with pt; telemetry applied and verified; VSS; pt skin clean, dry and intact with no opened wounds or pressure ulcers noted; pt in bed with call light within reach and bed alarm on. Will closely monitor. Delia Heady RN   04/17/18 1845  Vitals  Temp 98.4 F (36.9 C)  Temp Source Oral  BP 115/69  MAP (mmHg) 84  BP Location Right Arm  BP Method Automatic  Patient Position (if appropriate) Lying  Pulse Rate 90  Pulse Rate Source Monitor  Resp 14  Oxygen Therapy  SpO2 98 %  O2 Device Room Air  MEWS Score  MEWS RR 0  MEWS Pulse 0  MEWS Systolic 0  MEWS LOC 0  MEWS Temp 0  MEWS Score 0  MEWS Score Color Green

## 2018-04-17 NOTE — Telephone Encounter (Addendum)
Pt was added to 11:30 clinic slot acutely this morning after she was contacted for [facility] appointment. To the [facility] nurse she reported feeling as bad as she had when she required hospitalization and was very SOB. She is extremely medically fragile with chronic CHF, cirrhosis with varices, pancytopenia, CKD and recent admission for CHF getting below baseline weight but complicated by an episode of hypotension. There is also mention that she may need transfusion but this was not pursued. Given all of this information it seems she would be best suited back for acute eval in the ED and possibly blood transfusion if Hgb has fallen further. We tried to call her to talk through symptoms but she did not answer so the plan was to see her when she got here. When she arrived, Rico Junker (nurse) went to speak with her in the lobby - per her report the patient looked like she was feeling terrible and is very SOB. Anderson Malta discussed instead proceeding to the ER and the patient agreed. She preferred her companion drive her. Will cancel today's appt.  Emelly Wurtz PA-C

## 2018-04-17 NOTE — H&P (Signed)
History and Physical  Samantha [name] DOB: June 02, 1950 DOA: 04/17/2018  PCP: [name], MD Patient coming from: Home   I have personally briefly reviewed patient's old medical records in [place]   Chief Complaint: SOB  HPI: Samantha [name] is a 68 y.o. 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 fib not on anticoagulation, recently discharged from the hospital on 04/14/2018 by the cardiology service, treated during that admission for heart failure exacerbation.  Discharge weight 89 kg.  Patient relates that at discharge her shortness of breath had improve.  She reports that over the days after discharge her shortness of breath progressively got worse.  Today she was on the phone with her cardiologist office, noticed that she was very short of breath.  They schedule her an appointment at 11 AM.  At the office patient was noted to to be tachypneic, very short of breath.  They were worry  that she needed a blood transfusion.  Patient was referred to the ED.  She currently denies chest pain.  See report mild cough since she is been here in the ED.  She had 3-4 episode of diarrhea the night prior to admission.  She thinks that she ate something bad.  She has been using her inhalers at home without significant improvement.  She is very short of breath just talking.  In the ED: Hemoglobin 8.3, white blood cell 3.0, platelets 64, troponin 0 0.01, BNP 140, sodium 140 potassium 4.1, BUN 9 creatinine 0.95. Chest x-ray;Hazy right middle lobe opacity superimposed on chronic scarring with new silhouetting of the right heart border, can not exclude right middle lobe pneumonia.  Short-term chest radiograph follow-up advised. Stable platelike scarring in the anterior left mid lung. Stable cardiomegaly without overt pulmonary edema.    Review of Systems: All systems reviewed and apart from history of presenting illness, are negative.  Past Medical History:  Diagnosis Date  . Chronic combined systolic and diastolic CHF (congestive heart failure) ([place])   . Cirrhosis of liver ([place])   . CKD (chronic kidney disease), stage III ([place])   . Coronary artery disease    a. s/p CABG 2001 w/ (LIMA-OM, SVG-D1, SVG-RCA). b. h/o multiple PCIs per Dr. Antionette Char note.  . Depression   . Esophageal varices ([place])    New 2013  . Gastroesophageal reflux disease   . History of pneumonia   . Hyperlipidemia   . Hypertension   . Obesity   . Osteoarthritis   . Pancytopenia ([place])   . Paroxysmal atrial flutter ([place])    a. dx 05/2016.  [name] Persistent atrial fibrillation    a. reported in hosp 07/2016, not on anticoag due to cirrhosis and liver disease, low platelets, varices.  . Thrombocytopenia ([place])    Past Surgical History:  Procedure Laterality Date  . ABDOMINAL HYSTERECTOMY    . BACK SURGERY    . CARDIAC CATHETERIZATION  2004   left internal mammary artery to the obtuse marginal  was found to be small and thread like.  The two grafts were patent.  The left circumflex had 90% in-stent restenosis and cutting balloon angioplasty was performed followed by placement of a 3.0 x 75m Taxus drug -eluting stent.    .[name]  CARDIAC CATHETERIZATION  2006   There was in-stent restenosis in the left circumflex and this was treated with cutting balloon angioplasty   . CARDIAC CATHETERIZATION  2008   vein graft to the to the obtuse marginal was patent, although small, left circumflex had 40% in-stent restenosis, ejection fraction 40-45%.  The patient was medically mananged.  [name] CARDIAC CATHETERIZATION N/A 01/09/2015   Procedure: Left Heart Cath and Cors/Grafts Angiography;  Surgeon: Belva Crome, MD;   Location: [facility];  Service: Cardiovascular;  Laterality: N/A;  . COLONOSCOPY  03/29/2011   Procedure: COLONOSCOPY;  Surgeon: Jamesetta So, MD;  [facility];  Service: Gastroenterology;  Laterality: N/A;  . CORONARY ARTERY BYPASS GRAFT  May 31,2001   x 3 with a vein graft to the first diagonal, vein graft to the right coronary  artery, and a free left internal mammary  artery to the obtuse marginal   . ESOPHAGEAL BANDING N/A 07/04/2012   Procedure: ESOPHAGEAL BANDING;  Surgeon: Rogene Houston, MD;  [facility];  Service: Endoscopy;  Laterality: N/A;  . ESOPHAGEAL BANDING N/A 09/17/2012   Procedure: ESOPHAGEAL BANDING;  Surgeon: Rogene Houston, MD;  [facility];  Service: Endoscopy;  Laterality: N/A;  . ESOPHAGEAL BANDING N/A 10/22/2013   Procedure: ESOPHAGEAL BANDING;  Surgeon: Rogene Houston, MD;  [facility];  Service: Endoscopy;  Laterality: N/A;  . ESOPHAGEAL BANDING N/A 11/28/2014   Procedure: ESOPHAGEAL BANDING;  Surgeon: Rogene Houston, MD;  [facility];  Service: Endoscopy;  Laterality: N/A;  . ESOPHAGEAL BANDING N/A 10/29/2015   Procedure: ESOPHAGEAL BANDING;  Surgeon: Rogene Houston, MD;  [facility];  Service: Endoscopy;  Laterality: N/A;  . ESOPHAGOGASTRODUODENOSCOPY  12/20/2011   Procedure: ESOPHAGOGASTRODUODENOSCOPY (EGD);  Surgeon: Jamesetta So, MD;  [facility];  Service: Gastroenterology;  Laterality: N/A;  . ESOPHAGOGASTRODUODENOSCOPY N/A 07/04/2012   Procedure: ESOPHAGOGASTRODUODENOSCOPY (EGD);  Surgeon: Rogene Houston, MD;  [facility];  Service: Endoscopy;  Laterality: N/A;  235-moved to 255 Ann to notify pt  . ESOPHAGOGASTRODUODENOSCOPY N/A 09/17/2012   Procedure: ESOPHAGOGASTRODUODENOSCOPY (EGD);  Surgeon: Rogene Houston, MD;  [facility];  Service: Endoscopy;  Laterality: N/A;  730  . ESOPHAGOGASTRODUODENOSCOPY N/A 10/22/2013   Procedure:  ESOPHAGOGASTRODUODENOSCOPY (EGD);  Surgeon: Rogene Houston, MD;  [facility];  Service: Endoscopy;  Laterality: N/A;  730  . ESOPHAGOGASTRODUODENOSCOPY N/A 11/28/2014   Procedure: ESOPHAGOGASTRODUODENOSCOPY (EGD);  Surgeon: Rogene Houston, MD;  [facility];  Service: Endoscopy;  Laterality: N/A;  1:25  . ESOPHAGOGASTRODUODENOSCOPY N/A 10/29/2015   Procedure: ESOPHAGOGASTRODUODENOSCOPY (EGD);  Surgeon: Rogene Houston, MD;  [facility];  Service: Endoscopy;  Laterality: N/A;  12:00  . ESOPHAGOGASTRODUODENOSCOPY N/A 12/12/2016   Procedure: ESOPHAGOGASTRODUODENOSCOPY (EGD);  Surgeon: Rogene Houston, MD;  [facility];  Service: Endoscopy;  Laterality: N/A;  225  . FLEXIBLE SIGMOIDOSCOPY N/A 03/20/2017   Procedure: FLEXIBLE SIGMOIDOSCOPY;  Surgeon: Rogene Houston, MD;  [facility];  Service: Endoscopy;  Laterality: N/A;  7:30  . JOINT REPLACEMENT    . LEFT HEART CATH AND CORS/GRAFTS ANGIOGRAPHY N/A 08/15/2016   Procedure: Left Heart Cath and Cors/Grafts Angiography;  Surgeon: Jettie Booze, MD;  Location: [facility];  Service: Cardiovascular;  Laterality: N/A;  . LEFT HEART CATHETERIZATION WITH CORONARY/GRAFT ANGIOGRAM N/A 12/25/2013   Procedure: LEFT HEART CATHETERIZATION WITH CORONARY/GRAFT ANGIOGRAM;  Surgeon: Blane Ohara, MD;  Location: [facility];  Service: Cardiovascular;  Laterality: N/A;  . RIGHT HEART CATH N/A 05/29/2017   Procedure: RIGHT HEART CATH;  Surgeon: Jolaine Artist, MD;  Location: [facility];  Service: Cardiovascular;  Laterality: N/A;  . rotator cuff left  2007  . TONSILLECTOMY     Social History:  reports that she quit smoking about 22 years ago. Her smoking use included cigarettes. She has a 10.00 pack-year smoking history. She has never used smokeless tobacco. She reports that she does not drink alcohol or use drugs.   Allergies  Allergen Reactions  . Entresto  [Sacubitril-Valsartan] Swelling    Swelling of eyes  . Acetaminophen Other (See Comments)    Liver problems  . Oxycodone Itching and Nausea Only  . Ace Inhibitors Other (See Comments)    UNSURE OF REACTION TYPE  . Cefaclor Itching and Rash  . Cephalexin Itching and Rash  . Penicillins Rash    Has patient had a PCN reaction causing immediate rash, facial/tongue/throat swelling, SOB or lightheadedness with hypotension: YES Has patient had a PCN reaction causing severe rash involving mucus membranes or skin necrosis: NO Has patient had a PCN reaction that required hospitalization: YES Has patient had a PCN reaction occurring within the last 10 years: NO If all of the above answers are "NO", then may proceed with Cephalosporin use.   . Pregabalin Other (See Comments)    Retains fluid  . Tape Itching and Rash    Takes skin right off with medical tape--PAPER TAPE ONLY Other reaction(s): Itching Takes skin right off with medical tape--PAPER TAPE ONLY Takes skin right off with medical tape--PAPER TAPE ONLY    Family History  Problem Relation Age of Onset  . Heart attack Mother   . Heart attack Father 88       cause of death  . Coronary artery disease Sister        CABG   . Colon cancer Neg Hx     Prior to Admission medications   Medication Sig Start Date End Date Taking? Authorizing Provider  albuterol (PROVENTIL HFA;VENTOLIN HFA) 108 (90 BASE) MCG/ACT inhaler Inhale 2 puffs into the lungs every 6 (six) hours as needed for wheezing or shortness of breath.    Yes [provider]  atorvastatin (LIPITOR) 20 MG tablet Take 20 mg by mouth every evening.    Yes [provider]  calcium carbonate (TUMS - DOSED IN MG ELEMENTAL CALCIUM) 500 MG chewable tablet Chew 1 tablet by mouth daily as needed for indigestion or heartburn.   Yes [provider]  ezetimibe (ZETIA) 10 MG tablet Take 10 mg by mouth at bedtime.    Yes [provider]  FLUoxetine (PROZAC) 40  MG capsule Take 40 mg by mouth daily.    Yes [provider]  HYDROcodone-acetaminophen (NORCO) 10-325 MG tablet Take 1 tablet by mouth as needed for pain. 11/06/17  Yes [provider]  isosorbide mononitrate (IMDUR) 60 MG 24 hr tablet Take 1.5 tablets (90 mg total) by mouth daily. 11/15/17 11/10/18 Yes Sherren Mocha, MD  metoprolol succinate (TOPROL XL) 25 MG 24 hr tablet Take 1 tablet (25 mg total) by mouth daily. 03/14/18  Yes Weaver, Scott T, PA-C  NITROSTAT 0.4 MG SL tablet Place 0.4 mg under the tongue every 5 (five) minutes as needed for chest pain (x 3 tabs daily).  11/15/13  Yes [provider]  pantoprazole (PROTONIX) 40 MG tablet  Take 40 mg by mouth daily.   Yes [provider]  polyethylene glycol (MIRALAX / GLYCOLAX) packet Take 17 g by mouth daily.   Yes [provider]  tobramycin-dexamethasone (TOBRADEX) ophthalmic solution Place 1 drop into both eyes 4 (four) times daily. 04/16/18  Yes [provider]  torsemide (DEMADEX) 20 MG tablet Take 2 tablets (40 mg total) by mouth daily. 11/15/17 11/10/18 Yes Sherren Mocha, MD  valsartan (DIOVAN) 160 MG tablet Take 1 tablet (160 mg total) by mouth at bedtime. 06/15/17 06/10/18 Yes Shirley Friar, PA-C   Physical Exam: Vitals:   04/17/18 1224 04/17/18 1726 04/17/18 1734  BP: (!) 100/52  (!) 123/58  Pulse: 82  86  Resp: (!) 22  19  Temp: 98 F (36.7 C)  98.9 F (37.2 C)  TempSrc: Oral  Oral  SpO2: 98%    Weight:  92.1 kg   Height:  5' 3"  (1.6 m)      General exam: Mild tachypnea, mild distress  Head, eyes and ENT: Nontraumatic and normocephalic. Pupils equally reacting to light and accommodation. Oral mucosa moist.  Neck: Supple.  Positive JVD, carotid bruit or thyromegaly.  Lymphatics: No lymphadenopathy.  Respiratory system: Tachypnea, wheezing on the right side, crackles on the left  Cardiovascular system: S1 and S2 heard, RRR. No JVD, murmurs, gallops, clicks or  pedal edema.  Gastrointestinal system: Abdomen is nondistended, soft and nontender. Normal bowel sounds heard. No organomegaly or masses appreciated.  Central nervous system: Alert and oriented. No focal neurological deficits.  Extremities: Symmetric 5 x 5 power. Peripheral pulses symmetrically felt.   Skin: No rashes or acute findings.  Musculoskeletal system: Negative exam.  Psychiatry: Pleasant and cooperative.   Labs on Admission:  Basic Metabolic Panel: Recent Labs  Lab 04/12/18 0235 04/12/18 1244 04/13/18 0304 04/14/18 0716 04/17/18 1233  NA 141 139 142 140 140  K 3.1* 3.5 4.1 3.9 4.1  CL 98 103 107 107 106  CO2 30 25 29 27 25   GLUCOSE 112* 107* 109* 99 163*  BUN 13 14 12 12 9   CREATININE 1.44* 1.16* 1.01* 1.10* 0.95  CALCIUM 8.6* 8.5* 8.7* 8.9 9.0   Liver Function Tests: Recent Labs  Lab 04/12/18 0552  AST 29  ALT 11  ALKPHOS 54  BILITOT 1.3*  PROT 6.2*  ALBUMIN 3.0*   No results for input(s): LIPASE, AMYLASE in the last 168 hours. No results for input(s): AMMONIA in the last 168 hours. CBC: Recent Labs  Lab 04/11/18 1203 04/11/18 1715 04/13/18 0304 04/14/18 0716 04/17/18 1233  WBC 3.5* 3.8* 3.1* 2.5* 3.0*  HGB 8.8* 9.0* 7.7* 7.7* 8.3*  HCT 30.5* 31.5* 27.5* 26.7* 29.5*  MCV 76.4* 78.9* 77.9* 77.4* 80.2  PLT 79* PLATELET CLUMPS NOTED ON SMEAR, UNABLE TO ESTIMATE 63* 57* 64*   Cardiac Enzymes: Recent Labs  Lab 04/12/18 0552  TROPONINI <0.03    BNP (last 3 results) No results for input(s): PROBNP in the last 8760 hours. CBG: No results for input(s): GLUCAP in the last 168 hours.  Radiological Exams on Admission: Dg Chest 2 View  Result Date: 04/17/2018 CLINICAL DATA:  Dyspnea EXAM: CHEST - 2 VIEW COMPARISON:  04/11/2018 chest radiograph. FINDINGS: Stable configuration of sternotomy wires noting discontinuity in the lower most sternotomy wire. CABG clips overlie the mediastinum. Stable cardiomediastinal silhouette with cardiomegaly. No  pneumothorax. No pleural effusion. Stable platelike scarring in the anterior left mid lung. Hazy opacity in the right middle lobe with new silhouetting of the right  heart border superimposed on chronic scarring. No overt pulmonary edema. IMPRESSION: 1. Hazy right middle lobe opacity superimposed on chronic scarring with new silhouetting of the right heart border, can not exclude right middle lobe pneumonia. Short-term chest radiograph follow-up advised. 2. Stable platelike scarring in the anterior left mid lung. 3. Stable cardiomegaly without overt pulmonary edema. Electronically Signed   By: Ilona Sorrel M.D.   On: 04/17/2018 13:05    EKG: Independently reviewed.  A. fib rate control  Assessment/Plan Active Problems:   CAD (coronary artery disease)   Essential hypertension   S/P CABG x 3- 2001   Anemia   Cirrhosis, non-alcoholic ([facility])   Cardiomyopathy, ischemic-35-40% by cath    Persistent atrial fibrillation   Chronic combined systolic and diastolic CHF (congestive heart failure) ([facility])   Heart failure ([facility])  1-Acute respiratory distress, shortness of breath: Suspect this is multifactorial, heart failure, anemia, probably developing some pneumonia. IV Lasix prior to blood transfusion, subsequently 40 twice daily 1 unit of packed red blood cell. We will order nebulizer treatment. Cardiology consulted.  2-Anemia;  Getting 1 unit of packed red blood cell ordered by ED physician.  We will give IV Lasix prior to blood transfusion. Hb at 9.5 one month.   3-Persistent A. Fib; with metoprolol.  4-Acute on chronic combined systolic and diastolic heart failure exacerbation; Patient present with shortness of breath, she denies weight gain since she was discharged the hospital. She has positive JVD. We will start IV Lexis 40 mg IV twice daily. Continue with metoprolol, Imdur. I will hold Cozaar, due to systolic blood pressure being in the 112 range.  5-possible pneumonia: She has chronic  pancytopenia, unlikely she will have leukocytosis. X-ray with infiltrate.  Will start azithromycin.  If his spike fever will need to broaden antibiotics. Will check procalcitonin level if negative, consider stopping antibiotics.  6-coronary artery disease: Continue with Imdur, metoprolol.  DVT Prophylaxis: SCDs Code Status: Full code Family Communication: Care discussed with patient Disposition Plan: Admit under observation, blood transfusion, IV Lasix.  Time spent: 75 minutes.   Elmarie Shiley MD [facility]   04/17/2018, 5:36 PM

## 2018-04-17 NOTE — Consult Note (Signed)
Cardiology Admission History and Physical:   Patient ID: Samantha [name] MRN: [id number]; DOB: 18-May-1950   Admission date: 04/17/2018  Primary Care Provider: [name], MD Primary Cardiologist: Sherren Mocha, MD   Chief Complaint:  Asked to see patient for SOB, CHF by Dr Tyrell Antonio  Patient Profile:   Samantha [name] is a 68 y.o. female with hx of chronic systolic / diastolic CHF  LVEF 40 to 61% (echo Sept 2019) , CAD (remote CABG in 2001; hx of multiple PCIs) nonalcoholic cirrhosis, varices, persistant afib (not on anticoagulation)   Presents to ED with SOB  History of Present Illness:   Samantha [name] is a 68 yo with history of chronicsystolic CHF The pt was admitted to [facility] (2/12-2/15/20)  She presented on 2/12 with worsening SOB for 3 day  Unable to do usual activity.   Noted 9 lb wt gain and increase edema and in creased orthopnea.  Prior to last admit she was taking an extra torsemide The pt diuresed during this admit  Wt on d/c was 89 kg  The pt says at d/c she felt a little better but not great    She has continued to decline since discharge   Wt has gone up 9 lbs    The pt called clinic yesterday saying she felt bad  Set up to be seen in clinic today She wa very SOB   Review on records, with anemia, and SOB she was told to come to ED instead     Pt currently getting transfused   Breathing is fair   Unable to lay flat    Past Medical History:  Diagnosis Date  . Chronic combined systolic and diastolic CHF (congestive heart failure) ([place])   . Cirrhosis of liver ([place])   . CKD (chronic kidney disease), stage III ([place])   . Coronary artery disease    a. s/p CABG 2001 w/ (LIMA-OM, SVG-D1, SVG-RCA). b. h/o multiple PCIs per Dr. Antionette Char note.  . Depression   . Esophageal varices ([place])    New 2013  . Gastroesophageal reflux disease   . History of pneumonia   . Hyperlipidemia   . Hypertension   . Obesity   . Osteoarthritis   . Pancytopenia ([place])   . Paroxysmal  atrial flutter ([place])    a. dx 05/2016.  [name] Persistent atrial fibrillation    a. reported in hosp 07/2016, not on anticoag due to cirrhosis and liver disease, low platelets, varices.  . Thrombocytopenia ([place])     Past Surgical History:  Procedure Laterality Date  . ABDOMINAL HYSTERECTOMY    . BACK SURGERY    . CARDIAC CATHETERIZATION  2004   left internal mammary artery to the obtuse marginal  was found to be small and thread like.  The two grafts were patent.  The left circumflex had 90% in-stent restenosis and cutting balloon angioplasty was performed followed by placement of a 3.0 x 70m Taxus drug -eluting stent.    .Marland KitchenCARDIAC CATHETERIZATION  2006   There was in-stent restenosis in the left circumflex and this was treated with cutting balloon angioplasty   . CARDIAC CATHETERIZATION  2008   vein graft to the to the obtuse marginal was patent, although small, left circumflex had 40% in-stent restenosis, ejection fraction 40-45%.  The patient was medically mananged.  .Marland KitchenCARDIAC CATHETERIZATION N/A 01/09/2015   Procedure: Left Heart Cath and Cors/Grafts Angiography;  Surgeon: HBelva Crome MD;  [facility];  Service: Cardiovascular;  Laterality: N/A;  . COLONOSCOPY  03/29/2011   Procedure: COLONOSCOPY;  Surgeon: Jamesetta So, MD;  [facility];  Service: Gastroenterology;  Laterality: N/A;  . CORONARY ARTERY BYPASS GRAFT  May 31,2001   x 3 with a vein graft to the first diagonal, vein graft to the right coronary  artery, and a free left internal mammary  artery to the obtuse marginal   . ESOPHAGEAL BANDING N/A 07/04/2012   Procedure: ESOPHAGEAL BANDING;  Surgeon: Rogene Houston, MD;  [facility];  Service: Endoscopy;  Laterality: N/A;  . ESOPHAGEAL BANDING N/A 09/17/2012   Procedure: ESOPHAGEAL BANDING;  Surgeon: Rogene Houston, MD;  [facility];  Service: Endoscopy;  Laterality: N/A;  . ESOPHAGEAL BANDING N/A 10/22/2013   Procedure:  ESOPHAGEAL BANDING;  Surgeon: Rogene Houston, MD;  [facility];  Service: Endoscopy;  Laterality: N/A;  . ESOPHAGEAL BANDING N/A 11/28/2014   Procedure: ESOPHAGEAL BANDING;  Surgeon: Rogene Houston, MD;  [facility];  Service: Endoscopy;  Laterality: N/A;  . ESOPHAGEAL BANDING N/A 10/29/2015   Procedure: ESOPHAGEAL BANDING;  Surgeon: Rogene Houston, MD;  [facility];  Service: Endoscopy;  Laterality: N/A;  . ESOPHAGOGASTRODUODENOSCOPY  12/20/2011   Procedure: ESOPHAGOGASTRODUODENOSCOPY (EGD);  Surgeon: Jamesetta So, MD;  [facility];  Service: Gastroenterology;  Laterality: N/A;  . ESOPHAGOGASTRODUODENOSCOPY N/A 07/04/2012   Procedure: ESOPHAGOGASTRODUODENOSCOPY (EGD);  Surgeon: Rogene Houston, MD;  [facility];  Service: Endoscopy;  Laterality: N/A;  235-moved to 255 Ann to notify pt  . ESOPHAGOGASTRODUODENOSCOPY N/A 09/17/2012   Procedure: ESOPHAGOGASTRODUODENOSCOPY (EGD);  Surgeon: Rogene Houston, MD;  [facility];  Service: Endoscopy;  Laterality: N/A;  730  . ESOPHAGOGASTRODUODENOSCOPY N/A 10/22/2013   Procedure: ESOPHAGOGASTRODUODENOSCOPY (EGD);  Surgeon: Rogene Houston, MD;  [facility];  Service: Endoscopy;  Laterality: N/A;  730  . ESOPHAGOGASTRODUODENOSCOPY N/A 11/28/2014   Procedure: ESOPHAGOGASTRODUODENOSCOPY (EGD);  Surgeon: Rogene Houston, MD;  [facility];  Service: Endoscopy;  Laterality: N/A;  1:25  . ESOPHAGOGASTRODUODENOSCOPY N/A 10/29/2015   Procedure: ESOPHAGOGASTRODUODENOSCOPY (EGD);  Surgeon: Rogene Houston, MD;  [facility];  Service: Endoscopy;  Laterality: N/A;  12:00  . ESOPHAGOGASTRODUODENOSCOPY N/A 12/12/2016   Procedure: ESOPHAGOGASTRODUODENOSCOPY (EGD);  Surgeon: Rogene Houston, MD;  [facility];  Service: Endoscopy;  Laterality: N/A;  225  . FLEXIBLE SIGMOIDOSCOPY N/A 03/20/2017   Procedure: FLEXIBLE SIGMOIDOSCOPY;  Surgeon: Rogene Houston,  MD;  [facility];  Service: Endoscopy;  Laterality: N/A;  7:30  . JOINT REPLACEMENT    . LEFT HEART CATH AND CORS/GRAFTS ANGIOGRAPHY N/A 08/15/2016   Procedure: Left Heart Cath and Cors/Grafts Angiography;  Surgeon: Jettie Booze, MD;  Location: [place] [facility];  Service: Cardiovascular;  Laterality: N/A;  . LEFT HEART CATHETERIZATION WITH CORONARY/GRAFT ANGIOGRAM N/A 12/25/2013   Procedure: LEFT HEART CATHETERIZATION WITH Beatrix Fetters;  Surgeon: Blane Ohara, MD;  Location: [facility] LAB;  Service: Cardiovascular;  Laterality: N/A;  . RIGHT HEART CATH N/A 05/29/2017   Procedure: RIGHT HEART CATH;  Surgeon: Jolaine Artist, MD;  Location: [place] [facility];  Service: Cardiovascular;  Laterality: N/A;  . rotator cuff left  2007  . TONSILLECTOMY       Medications Prior to Admission: Prior to Admission medications   Medication Sig Start Date End Date Taking? Authorizing Provider  albuterol (PROVENTIL  HFA;VENTOLIN HFA) 108 (90 BASE) MCG/ACT inhaler Inhale 2 puffs into the lungs every 6 (six) hours as needed for wheezing or shortness of breath.    Yes [provider]  atorvastatin (LIPITOR) 20 MG tablet Take 20 mg by mouth every evening.    Yes [provider]  calcium carbonate (TUMS - DOSED IN MG ELEMENTAL CALCIUM) 500 MG chewable tablet Chew 1 tablet by mouth daily as needed for indigestion or heartburn.   Yes [provider]  ezetimibe (ZETIA) 10 MG tablet Take 10 mg by mouth at bedtime.    Yes [provider]  FLUoxetine (PROZAC) 40 MG capsule Take 40 mg by mouth daily.    Yes [provider]  HYDROcodone-acetaminophen (NORCO) 10-325 MG tablet Take 1 tablet by mouth as needed for pain. 11/06/17  Yes [provider]  isosorbide mononitrate (IMDUR) 60 MG 24 hr tablet Take 1.5 tablets (90 mg total) by mouth daily. 11/15/17 11/10/18 Yes Sherren Mocha, MD  metoprolol succinate (TOPROL XL) 25 MG 24 hr tablet  Take 1 tablet (25 mg total) by mouth daily. 03/14/18  Yes Weaver, Scott T, PA-C  NITROSTAT 0.4 MG SL tablet Place 0.4 mg under the tongue every 5 (five) minutes as needed for chest pain (x 3 tabs daily).  11/15/13  Yes [provider]  pantoprazole (PROTONIX) 40 MG tablet Take 40 mg by mouth daily.   Yes [provider]  polyethylene glycol (MIRALAX / GLYCOLAX) packet Take 17 g by mouth daily.   Yes [provider]  tobramycin-dexamethasone (TOBRADEX) ophthalmic solution Place 1 drop into both eyes 4 (four) times daily. 04/16/18  Yes [provider]  torsemide (DEMADEX) 20 MG tablet Take 2 tablets (40 mg total) by mouth daily. 11/15/17 11/10/18 Yes Sherren Mocha, MD  valsartan (DIOVAN) 160 MG tablet Take 1 tablet (160 mg total) by mouth at bedtime. 06/15/17 06/10/18 Yes Shirley Friar, PA-C     Allergies:    Allergies  Allergen Reactions  . Entresto [Sacubitril-Valsartan] Swelling    Swelling of eyes  . Acetaminophen Other (See Comments)    Liver problems  . Oxycodone Itching and Nausea Only  . Ace Inhibitors Other (See Comments)    UNSURE OF REACTION TYPE  . Cefaclor Itching and Rash  . Cephalexin Itching and Rash  . Penicillins Rash    Has patient had a PCN reaction causing immediate rash, facial/tongue/throat swelling, SOB or lightheadedness with hypotension: YES Has patient had a PCN reaction causing severe rash involving mucus membranes or skin necrosis: NO Has patient had a PCN reaction that required hospitalization: YES Has patient had a PCN reaction occurring within the last 10 years: NO If all of the above answers are "NO", then may proceed with Cephalosporin use.   . Pregabalin Other (See Comments)    Retains fluid  . Tape Itching and Rash    Takes skin right off with medical tape--PAPER TAPE ONLY Other reaction(s): Itching Takes skin right off with medical tape--PAPER TAPE ONLY Takes skin right off with medical tape--PAPER TAPE ONLY     Social History:   Social History   Socioeconomic History  . Marital status: Divorced    Spouse name: Not on file  . Number of children: Not on file  . Years of education: Not on file  . Highest education level: Not on file  Occupational History  . Occupation: Disabled  Social Needs  . Financial resource strain: Not on file  . Food insecurity:  Worry: Not on file    Inability: Not on file  . Transportation needs:    Medical: Not on file    Non-medical: Not on file  Tobacco Use  . Smoking status: Former Smoker    Packs/day: 0.50    Years: 20.00    Pack years: 10.00    Types: Cigarettes    Last attempt to quit: 07/21/1995    Years since quitting: 22.7  . Smokeless tobacco: Never Used  Substance and Sexual Activity  . Alcohol use: No  . Drug use: No  . Sexual activity: Not on file  Lifestyle  . Physical activity:    Days per week: Not on file    Minutes per session: Not on file  . Stress: Not on file  Relationships  . Social connections:    Talks on phone: Not on file    Gets together: Not on file    Attends religious service: Not on file    Active member of club or organization: Not on file    Attends meetings of clubs or organizations: Not on file    Relationship status: Not on file  . Intimate partner violence:    Fear of current or ex partner: Not on file    Emotionally abused: Not on file    Physically abused: Not on file    Forced sexual activity: Not on file  Other Topics Concern  . Not on file  Social History Narrative   Lives in [place] by herself.      Family History:   The patient's family history includes Coronary artery disease in her sister; Heart attack in her mother; Heart attack (age of onset: 68) in her father. There is no history of Colon cancer.    ROS:  Please see the history of present illness.       Physical Exam/Data:   Vitals:   04/17/18 1224 04/17/18 1726  BP: (!) 100/52   Pulse: 82   Resp: (!) 22   Temp: 98 F (36.7 C)     TempSrc: Oral   SpO2: 98%   Weight:  92.1 kg  Height:  5' 3"  (1.6 m)   No intake or output data in the 24 hours ending 04/17/18 1732 Last 3 Weights 04/17/2018 04/14/2018 04/13/2018  Weight (lbs) 203 lb 196 lb 4.8 oz 208 lb 6.4 oz  Weight (kg) 92.08 kg 89.041 kg 94.53 kg     Body mass index is 35.96 kg/m.  General:  Well nourished, well developed, in no acute distress  Receiving transfusion   HEENT: normal Lymph: no adenopathy Neck: Neck is full Endocrine:  No thryomegaly Vascular: No carotid bruits; FA pulses 2+ bilaterally without bruits  Cardiac:  normal S1, S2; RRR; no murmur  Lungs:  Rel clear anteriorly with some decreased airflow  Abd: soft, nontender, no hepatomegaly  Ext: Triv to 1+   edema Musculoskeletal:  No deformities, BUE and BLE strength normal and equal Skin: warm and dry  Neuro:  CNs 2-12 intact, no focal abnormalities noted Psych:  Normal affect    EKG:  The ECG that was done  was personally reviewed and demonstrates  Atrial fibrillation   99 bpm  SL ST depression ST seg inferiro and lateral leads  Laboratory Data:  Chemistry Recent Labs  Lab 04/14/18 0716 04/17/18 1233  NA 140 140  K 3.9 4.1  CL 107 106  CO2 27 25  GLUCOSE 99 163*  BUN 12 9  CREATININE 1.10* 0.95  CALCIUM  8.9 9.0  GFRNONAA 51* >60  GFRAA 59* >60  ANIONGAP 6 9    Recent Labs  Lab 04/12/18 0552  PROT 6.2*  ALBUMIN 3.0*  AST 29  ALT 11  ALKPHOS 54  BILITOT 1.3*   Hematology Recent Labs  Lab 04/14/18 0716 04/17/18 1233  WBC 2.5* 3.0*  RBC 3.45* 3.68*  HGB 7.7* 8.3*  HCT 26.7* 29.5*  MCV 77.4* 80.2  MCH 22.3* 22.6*  MCHC 28.8* 28.1*  RDW 18.5* 18.6*  PLT 57* 64*   Cardiac Enzymes Recent Labs  Lab 04/12/18 0552  TROPONINI <0.03    Recent Labs  Lab 04/11/18 2115 04/17/18 1249  TROPIPOC 0.01 0.01    BNP Recent Labs  Lab 04/11/18 1715 04/17/18 1511  BNP 159.6* 140.2*    DDimer No results for input(s): DDIMER in the last 168  hours.  Radiology/Studies:  Dg Chest 2 View  Result Date: 04/17/2018 CLINICAL DATA:  Dyspnea EXAM: CHEST - 2 VIEW COMPARISON:  04/11/2018 chest radiograph. FINDINGS: Stable configuration of sternotomy wires noting discontinuity in the lower most sternotomy wire. CABG clips overlie the mediastinum. Stable cardiomediastinal silhouette with cardiomegaly. No pneumothorax. No pleural effusion. Stable platelike scarring in the anterior left mid lung. Hazy opacity in the right middle lobe with new silhouetting of the right heart border superimposed on chronic scarring. No overt pulmonary edema. IMPRESSION: 1. Hazy right middle lobe opacity superimposed on chronic scarring with new silhouetting of the right heart border, can not exclude right middle lobe pneumonia. Short-term chest radiograph follow-up advised. 2. Stable platelike scarring in the anterior left mid lung. 3. Stable cardiomegaly without overt pulmonary edema. Electronically Signed   By: Ilona Sorrel M.D.   On: 04/17/2018 13:05    Assessment and Plan:   1  Acute on chronic systolic CHF   Pt with chronic SOB   Multifactoral   Anemia is adding to this   On exam, she has some increased volume on exam  She reports wt is up 9 lbs Would diurese after transfusion   Follow  BP, I/O and renal function    2   CAD   Remote CABG and hx multiple interventions    I am not convinced of active ishcemia   Agree with transfusion  3   Atrial fibrillation   Plan to follow rate on tele   This is probably exacerbating current situation with increased rates with anemia    Continue tele   Pt is not a candidate for anticoagulaiton with anemia and thrombocytopenia  4  Anemia  Currently getting pRBCs    Will need to be followed   Follow volume and diruese as needed  4  HL   Continue statin  5  Cirrhosis with varices, thrombocytopenia   Follow      For questions or updates, please contact [facility] Please consult [email or web address] for contact info under         Signed, Dorris Carnes, MD  04/17/2018 5:32 PM

## 2018-04-17 NOTE — Telephone Encounter (Signed)
Patient contacted regarding discharge from [facility] on 04/14/2018.  Patient understands to follow up with provider Samantha Copa, PA-c on 04/17/2018 at 11:30 at [facility] in [place]. Patient understands discharge instructions? Yes Patient understands medications and regiment? Yes Patient understands to bring all medications to this visit? Yes  The pt states that she is feeling no better now than she did while in the hospital and that she is very SOB. She states "I just feel bad". The pt was offered an appt to be seen today at this office at 11:30 with Samantha Copa, PA-c and she accepted.  She expressed much gratitude that we are able to see her today.

## 2018-04-17 NOTE — Telephone Encounter (Signed)
Per Melina Copa, PA-C, pt should go onto the ED for worsening SOB, since looking in her chart and she was 7.7 HGB and possible needing a blood transfusion. Pt agrees. Cancelled appt

## 2018-04-17 NOTE — ED Triage Notes (Signed)
Pt reports she has been having shortness of breath. She states she is anemic and her PCP sent her here for blood transfusion. Pt appears pale and also SOB.

## 2018-04-18 DIAGNOSIS — Z951 Presence of aortocoronary bypass graft: Secondary | ICD-10-CM | POA: Diagnosis not present

## 2018-04-18 DIAGNOSIS — I8510 Secondary esophageal varices without bleeding: Secondary | ICD-10-CM | POA: Diagnosis present

## 2018-04-18 DIAGNOSIS — I509 Heart failure, unspecified: Secondary | ICD-10-CM

## 2018-04-18 DIAGNOSIS — Z8249 Family history of ischemic heart disease and other diseases of the circulatory system: Secondary | ICD-10-CM | POA: Diagnosis not present

## 2018-04-18 DIAGNOSIS — K7460 Unspecified cirrhosis of liver: Secondary | ICD-10-CM

## 2018-04-18 DIAGNOSIS — I10 Essential (primary) hypertension: Secondary | ICD-10-CM

## 2018-04-18 DIAGNOSIS — K219 Gastro-esophageal reflux disease without esophagitis: Secondary | ICD-10-CM | POA: Diagnosis present

## 2018-04-18 DIAGNOSIS — E669 Obesity, unspecified: Secondary | ICD-10-CM | POA: Diagnosis present

## 2018-04-18 DIAGNOSIS — R0602 Shortness of breath: Secondary | ICD-10-CM | POA: Diagnosis present

## 2018-04-18 DIAGNOSIS — I4891 Unspecified atrial fibrillation: Secondary | ICD-10-CM | POA: Diagnosis not present

## 2018-04-18 DIAGNOSIS — J9601 Acute respiratory failure with hypoxia: Secondary | ICD-10-CM | POA: Diagnosis present

## 2018-04-18 DIAGNOSIS — K921 Melena: Secondary | ICD-10-CM | POA: Diagnosis present

## 2018-04-18 DIAGNOSIS — Z79899 Other long term (current) drug therapy: Secondary | ICD-10-CM | POA: Diagnosis not present

## 2018-04-18 DIAGNOSIS — I5023 Acute on chronic systolic (congestive) heart failure: Secondary | ICD-10-CM | POA: Diagnosis not present

## 2018-04-18 DIAGNOSIS — I5043 Acute on chronic combined systolic (congestive) and diastolic (congestive) heart failure: Secondary | ICD-10-CM

## 2018-04-18 DIAGNOSIS — N183 Chronic kidney disease, stage 3 (moderate): Secondary | ICD-10-CM | POA: Diagnosis present

## 2018-04-18 DIAGNOSIS — Z8701 Personal history of pneumonia (recurrent): Secondary | ICD-10-CM | POA: Diagnosis not present

## 2018-04-18 DIAGNOSIS — I255 Ischemic cardiomyopathy: Secondary | ICD-10-CM

## 2018-04-18 DIAGNOSIS — I130 Hypertensive heart and chronic kidney disease with heart failure and stage 1 through stage 4 chronic kidney disease, or unspecified chronic kidney disease: Secondary | ICD-10-CM | POA: Diagnosis present

## 2018-04-18 DIAGNOSIS — E876 Hypokalemia: Secondary | ICD-10-CM | POA: Diagnosis present

## 2018-04-18 DIAGNOSIS — F329 Major depressive disorder, single episode, unspecified: Secondary | ICD-10-CM | POA: Diagnosis present

## 2018-04-18 DIAGNOSIS — K7581 Nonalcoholic steatohepatitis (NASH): Secondary | ICD-10-CM | POA: Diagnosis present

## 2018-04-18 DIAGNOSIS — I2510 Atherosclerotic heart disease of native coronary artery without angina pectoris: Secondary | ICD-10-CM

## 2018-04-18 DIAGNOSIS — D61818 Other pancytopenia: Secondary | ICD-10-CM | POA: Diagnosis present

## 2018-04-18 DIAGNOSIS — D649 Anemia, unspecified: Secondary | ICD-10-CM | POA: Diagnosis not present

## 2018-04-18 DIAGNOSIS — E785 Hyperlipidemia, unspecified: Secondary | ICD-10-CM | POA: Diagnosis present

## 2018-04-18 DIAGNOSIS — I4819 Other persistent atrial fibrillation: Secondary | ICD-10-CM | POA: Diagnosis present

## 2018-04-18 DIAGNOSIS — Z90710 Acquired absence of both cervix and uterus: Secondary | ICD-10-CM | POA: Diagnosis not present

## 2018-04-18 DIAGNOSIS — D509 Iron deficiency anemia, unspecified: Secondary | ICD-10-CM | POA: Diagnosis not present

## 2018-04-18 DIAGNOSIS — I472 Ventricular tachycardia: Secondary | ICD-10-CM | POA: Diagnosis not present

## 2018-04-18 DIAGNOSIS — Z87891 Personal history of nicotine dependence: Secondary | ICD-10-CM | POA: Diagnosis not present

## 2018-04-18 LAB — COMPREHENSIVE METABOLIC PANEL
ALT: 12 U/L (ref 0–44)
AST: 34 U/L (ref 15–41)
Albumin: 3.2 g/dL — ABNORMAL LOW (ref 3.5–5.0)
Alkaline Phosphatase: 65 U/L (ref 38–126)
Anion gap: 11 (ref 5–15)
BUN: 9 mg/dL (ref 8–23)
CO2: 26 mmol/L (ref 22–32)
Calcium: 8.8 mg/dL — ABNORMAL LOW (ref 8.9–10.3)
Chloride: 103 mmol/L (ref 98–111)
Creatinine, Ser: 1.06 mg/dL — ABNORMAL HIGH (ref 0.44–1.00)
GFR calc Af Amer: >60 mL/min (ref >60)
GFR calc non Af Amer: 54 mL/min — ABNORMAL LOW (ref >60)
Glucose, Bld: 87 mg/dL (ref 70–99)
Potassium: 3.3 mmol/L — ABNORMAL LOW (ref 3.5–5.1)
Sodium: 140 mmol/L (ref 135–145)
Total Bilirubin: 1.1 mg/dL (ref 0.3–1.2)
Total Protein: 6.8 g/dL (ref 6.5–8.1)

## 2018-04-18 LAB — GLUCOSE, CAPILLARY: Glucose-Capillary: 88 mg/dL (ref 70–99)

## 2018-04-18 LAB — CBC
HCT: 32.3 % — ABNORMAL LOW (ref 36.0–46.0)
Hemoglobin: 9.5 g/dL — ABNORMAL LOW (ref 12.0–15.0)
MCH: 22.9 pg — ABNORMAL LOW (ref 26.0–34.0)
MCHC: 29.4 g/dL — ABNORMAL LOW (ref 30.0–36.0)
MCV: 78 fL — ABNORMAL LOW (ref 80.0–100.0)
Platelets: 65 10*3/uL — ABNORMAL LOW (ref 150–400)
RBC: 4.14 MIL/uL (ref 3.87–5.11)
RDW: 18.6 % — ABNORMAL HIGH (ref 11.5–15.5)
WBC: 2.8 10*3/uL — ABNORMAL LOW (ref 4.0–10.5)
nRBC: 0 % (ref 0.0–0.2)

## 2018-04-18 LAB — HIV ANTIBODY (ROUTINE TESTING W REFLEX): HIV Screen 4th Generation wRfx: NONREACTIVE

## 2018-04-18 LAB — PROCALCITONIN
Procalcitonin: <0.1 ng/mL
Procalcitonin: <0.1 ng/mL

## 2018-04-18 LAB — STREP PNEUMONIAE URINARY ANTIGEN: Strep Pneumo Urinary Antigen: NEGATIVE

## 2018-04-18 LAB — OCCULT BLOOD X 1 CARD TO LAB, STOOL: Fecal Occult Bld: POSITIVE — AB

## 2018-04-18 MED ORDER — HYDROCODONE-ACETAMINOPHEN 10-325 MG PO TABS
1.0000 | ORAL_TABLET | Freq: Four times a day (QID) | ORAL | Status: DC | PRN
  Administered 2018-04-18 – 2018-04-19 (×3): 1 via ORAL
  Filled 2018-04-18 (×3): qty 1

## 2018-04-18 MED ORDER — POTASSIUM CHLORIDE CRYS ER 20 MEQ PO TBCR
40.0000 meq | EXTENDED_RELEASE_TABLET | Freq: Once | ORAL | Status: AC
  Administered 2018-04-18: 40 meq via ORAL
  Filled 2018-04-18: qty 2

## 2018-04-18 NOTE — Progress Notes (Signed)
Progress Note    Samantha [name]  NOB:[id number] DOB: 1950-03-18  DOA: 04/17/2018 PCP: [name], MD    Brief Narrative:   Chief complaint: Shortness of breath  Medical records reviewed and are as summarized below:  Samantha [name] is an 68 y.o. female with past medical history significant for combined systolic and diastolic CHF, CAD status post CABG and PCI, [name] cirrhosis with complications of arterial varices, GI bleed, and persistent atrial fibrillation not on anticoagulation; who presents with weight gain and shortness of breath.  Suspected to be recurrent episode of congestive heart failure exacerbation as patient just discharged from the hospital with the same on 2/15.  Assessment/Plan:   Active Problems:   CAD (coronary artery disease)   Essential hypertension   S/P CABG x 3- 2001   Anemia   Cirrhosis, non-alcoholic (HCC)   Cardiomyopathy, ischemic-35-40% by cath    Persistent atrial fibrillation   Chronic combined systolic and diastolic CHF (congestive heart failure) (HCC)   Heart failure (HCC)   CHF (congestive heart failure) ([place]) Respiratory failure with hypoxia: Acute.  Symptoms thought to be likely multifactorial in nature.  Chest x-ray showing cardiomegaly without frank edema and possibility of pneumonia.  Suspect symptoms likely multifactorial in the setting of congestive heart failure, decompensated cirrhosis, and/or pneumonia. -Nasal cannula oxygen as needed -Breathing treatments as needed  Combined systolic and diastolic heart failure exacerbation: Acute on chronic.  Patient reported 9 pound weight gain in 3 days. Previous discharge weight 89 kg from hospitalization on 2/15. Positive JVD.  BNP 140.2.  Last EF noted to be 40 to 45% with grade 2 diastolic dysfunction on 10/4763.  She was started on 40 mg of Lasix IV twice daily.   -Daily weights -Strict I&O's -Continue IV Lasix 40 mg twice daily -Continue beta-blocker -Check pulse oximetry with  walking -Appreciate cardiology consultative services, will follow-up for further recommendation -May benefit from dietary consult  Decompensated cirrhosis with esophageal varices: Patient reports history of [name] leading to cirrhosis with known varices.  Physical exam abdominal ascites resolving.  Followed by  Dr. Laural Golden of gastroenterology and has a follow-up appointment set for 3/3.  -Unable to get a hold of Dr.Rehman yesterday -May warrant formal inpatient GI consult for further recommendation  Pancytopenia: Acute on chronic.  Hemoglobin noted to be as low as 7.7 on 2/15.  Patient was transfused 1 unit of blood on admission with repeat CBC 9.5 g/dL from 8.3.  Platelet counts near baseline and 60's.  Symptoms secondary to history of cirrhosis versus GI bleed given history of varices. Last GI bleed sometime in 01/2017. -Check stool occult -Recheck CBC in a.m.  -Continue to monitor transfuse blood products as needed -If GI bleed noted may warrant restarting empiric antibiotic  Community-acquired pneumonia: Chest x-ray with signs of possible right middle lobe pneumonia.  Patient afebrile and procalcitonin < 0.1.  She was initially started on antibiotics of azithromycin on admit.  Community-acquired pneumonia seems less likely at this time. -Check sputum culture and urine studies -Will consider discontinuing at azithromycin, day 2   Chronic atrial fibrillation: Patient not a candidate for anticoagulation due to anemia and thrombocytopenia. -Continue metoprolol  CAD: Patient with remote history of CABG.  -Continue statin and Imdur  Hyperlipidemia: Last lipid panel 04/2017 total cholesterol 111, HDL 38, LDL 54, triglycerides 93. -Continue atorvastatin and Zetia  Essential hypertension: Blood pressure currently 115/46. -Continue metoprolol as tolerated    Body mass index is  36.63 kg/m.   Family Communication/Anticipated D/C date and plan/Code Status   DVT prophylaxis: SCDs Code Status:  Full Code.  Family Communication: No family present at bedside Disposition Plan: Possible discharge home in 2 to 3 days given fluid overload status   Medical Consultants:    Cardiology -Dr. Harrington Challenger   Anti-Infectives:    None  Subjective:   Patient reports that her breathing is improving, but still is very short of breath with any kind of movement.  Denies having any diarrhea or blood in stools to her knowledge.  Objective:    Vitals:   04/18/18 1527 04/18/18 1535 04/18/18 1954 04/18/18 2326  BP:    (!) 106/40  Pulse:    73  Resp:    18  Temp:    98.5 F (36.9 C)  TempSrc:    Oral  SpO2: 95%  94% 94%  Weight:  93.8 kg    Height:        Intake/Output Summary (Last 24 hours) at 04/19/2018 0455 Last data filed at 04/18/2018 1734 Gross per 24 hour  Intake 600 ml  Output 1000 ml  Net -400 ml   Filed Weights   04/17/18 1726 04/18/18 1535  Weight: 92.1 kg 93.8 kg    Exam: Constitutional: NAD, calm, comfortable Eyes: PERRL, lids and conjunctivae normal ENMT: Mucous membranes are moist. Posterior pharynx clear of any exudate or lesions.  Neck: normal, supple, no masses, no thyromegaly Respiratory: Positive crackles on lung exam. Cardiovascular: Irregular irregular, no murmurs / rubs / gallops.  Trace lower extremity edema. 2+ pedal pulses. No carotid bruits.  Abdomen: Mild abdominal distention with minimum signs of fluid wave present.  No tenderness, no masses palpated. Bowel sounds positive.  Musculoskeletal: no clubbing / cyanosis. No joint deformity upper and lower extremities. Good ROM, no contractures. Normal muscle tone.  Skin: no rashes, lesions, ulcers. No induration Neurologic: CN 2-12 grossly intact. Sensation intact, DTR normal. Strength 5/5 in all 4.  Psychiatric: Normal judgment and insight. Alert and oriented x 3. Normal mood.    Data Reviewed:   I have personally reviewed following labs and imaging studies:  Labs: Labs show the following:   Basic  Metabolic Panel: Recent Labs  Lab 04/12/18 1244 04/13/18 0304 04/14/18 0716 04/17/18 1233 04/18/18 0546  NA 139 142 140 140 140  K 3.5 4.1 3.9 4.1 3.3*  CL 103 107 107 106 103  CO2 25 29 27 25 26   GLUCOSE 107* 109* 99 163* 87  BUN 14 12 12 9 9   CREATININE 1.16* 1.01* 1.10* 0.95 1.06*  CALCIUM 8.5* 8.7* 8.9 9.0 8.8*   GFR Estimated Creatinine Clearance: 54.6 mL/min (A) (by C-G formula based on SCr of 1.06 mg/dL (H)). Liver Function Tests: Recent Labs  Lab 04/12/18 0552 04/18/18 0546  AST 29 34  ALT 11 12  ALKPHOS 54 65  BILITOT 1.3* 1.1  PROT 6.2* 6.8  ALBUMIN 3.0* 3.2*   No results for input(s): LIPASE, AMYLASE in the last 168 hours. No results for input(s): AMMONIA in the last 168 hours. Coagulation profile Recent Labs  Lab 04/12/18 0552  INR 1.39    CBC: Recent Labs  Lab 04/13/18 0304 04/14/18 0716 04/17/18 1233 04/18/18 0546  WBC 3.1* 2.5* 3.0* 2.8*  HGB 7.7* 7.7* 8.3* 9.5*  HCT 27.5* 26.7* 29.5* 32.3*  MCV 77.9* 77.4* 80.2 78.0*  PLT 63* 57* 64* 65*   Cardiac Enzymes: Recent Labs  Lab 04/12/18 0552  TROPONINI <0.03   BNP (last 3  results) No results for input(s): PROBNP in the last 8760 hours. CBG: Recent Labs  Lab 04/18/18 0710  GLUCAP 88   D-Dimer: No results for input(s): DDIMER in the last 72 hours. Hgb A1c: No results for input(s): HGBA1C in the last 72 hours. Lipid Profile: No results for input(s): CHOL, HDL, LDLCALC, TRIG, CHOLHDL, LDLDIRECT in the last 72 hours. Thyroid function studies: No results for input(s): TSH, T4TOTAL, T3FREE, THYROIDAB in the last 72 hours.  Invalid input(s): FREET3 Anemia work up: No results for input(s): VITAMINB12, FOLATE, FERRITIN, TIBC, IRON, RETICCTPCT in the last 72 hours. Sepsis Labs: Recent Labs  Lab 04/13/18 0304 04/14/18 0716 04/17/18 1233 04/17/18 2259 04/18/18 0546  PROCALCITON  --   --   --  <0.10 <0.10  WBC 3.1* 2.5* 3.0*  --  2.8*    Microbiology No results found for this or  any previous visit (from the past 240 hour(s)).  Procedures and diagnostic studies:  Dg Chest 2 View  Result Date: 04/17/2018 CLINICAL DATA:  Dyspnea EXAM: CHEST - 2 VIEW COMPARISON:  04/11/2018 chest radiograph. FINDINGS: Stable configuration of sternotomy wires noting discontinuity in the lower most sternotomy wire. CABG clips overlie the mediastinum. Stable cardiomediastinal silhouette with cardiomegaly. No pneumothorax. No pleural effusion. Stable platelike scarring in the anterior left mid lung. Hazy opacity in the right middle lobe with new silhouetting of the right heart border superimposed on chronic scarring. No overt pulmonary edema. IMPRESSION: 1. Hazy right middle lobe opacity superimposed on chronic scarring with new silhouetting of the right heart border, can not exclude right middle lobe pneumonia. Short-term chest radiograph follow-up advised. 2. Stable platelike scarring in the anterior left mid lung. 3. Stable cardiomegaly without overt pulmonary edema. Electronically Signed   By: Ilona Sorrel M.D.   On: 04/17/2018 13:05    Medications:   . sodium chloride   Intravenous Once  . atorvastatin  20 mg Oral QPM  . azithromycin  500 mg Oral Q supper  . ezetimibe  10 mg Oral QHS  . FLUoxetine  40 mg Oral Daily  . furosemide  40 mg Intravenous Q12H  . ipratropium-albuterol  3 mL Nebulization BID  . isosorbide mononitrate  90 mg Oral Daily  . metoprolol succinate  25 mg Oral Daily  . pantoprazole  40 mg Oral Daily  . sodium chloride flush  3 mL Intravenous Once  . sodium chloride flush  3 mL Intravenous Q12H  . tobramycin-dexamethasone  1 drop Both Eyes QID   Continuous Infusions: . sodium chloride       LOS: 1 day   Rondell A Smith  [facility]   *Please refer to [email or web address], password [email or web address] to get updated schedule on who will round on this patient, as hospitalists switch teams weekly. If 7PM-7AM, please contact night-coverage at [email or web address], password [email or web address] for any  overnight needs.

## 2018-04-18 NOTE — Progress Notes (Addendum)
Progress Note  Patient Name: Samantha [name] Date of Encounter: 04/18/2018  Primary Cardiologist: Sherren Mocha, MD   Subjective   Feeling a bit better today after getting blood transfusion and IV Lasix, but still SOB at times and unable to lay flat due to orthopnea and PND. Continues to sit in bed with head elevated. She denies CP.   Inpatient Medications    Scheduled Meds: . sodium chloride   Intravenous Once  . atorvastatin  20 mg Oral QPM  . azithromycin  500 mg Oral Q supper  . ezetimibe  10 mg Oral QHS  . FLUoxetine  40 mg Oral Daily  . furosemide  40 mg Intravenous Q12H  . ipratropium-albuterol  3 mL Nebulization BID  . isosorbide mononitrate  90 mg Oral Daily  . metoprolol succinate  25 mg Oral Daily  . pantoprazole  40 mg Oral Daily  . sodium chloride flush  3 mL Intravenous Once  . sodium chloride flush  3 mL Intravenous Q12H  . tobramycin-dexamethasone  1 drop Both Eyes QID   Continuous Infusions: . sodium chloride     PRN Meds: sodium chloride, calcium carbonate, HYDROcodone-acetaminophen, nitroGLYCERIN, ondansetron **OR** ondansetron (ZOFRAN) IV, sodium chloride flush   Vital Signs    Vitals:   04/17/18 2030 04/17/18 2226 04/18/18 0858 04/18/18 0905  BP: (!) 102/52 (!) 110/49 (!) 115/46   Pulse: 87 89 73   Resp: 18 18    Temp: 98.4 F (36.9 C) 98.4 F (36.9 C) 97.9 F (36.6 C)   TempSrc: Oral Oral Oral   SpO2: 96% 95% 95% 94%  Weight:      Height:        Intake/Output Summary (Last 24 hours) at 04/18/2018 0929 Last data filed at 04/18/2018 0500 Gross per 24 hour  Intake 1387 ml  Output 1550 ml  Net -163 ml   Last 3 Weights 04/17/2018 04/14/2018 04/13/2018  Weight (lbs) 203 lb 196 lb 4.8 oz 208 lb 6.4 oz  Weight (kg) 92.08 kg 89.041 kg 94.53 kg      Telemetry    Atrial fibrillation w/ CVR in the 70s-80s - Personally Reviewed  ECG    Not performed today - Personally Reviewed  Physical Exam   GEN: WF, moderately obese, in no acute  distress   Neck: elevated JVD to level of ear Cardiac: irregularly irregular rhythm, regular rate Respiratory: Clear to auscultation bilaterally. GI: Soft, nontender, non-distended  MS: No edema; No deformity. Neuro:  Nonfocal  Psych: Normal affect   Labs    Chemistry Recent Labs  Lab 04/12/18 0552  04/14/18 0716 04/17/18 1233 04/18/18 0546  NA  --    < > 140 140 140  K  --    < > 3.9 4.1 3.3*  CL  --    < > 107 106 103  CO2  --    < > 27 25 26   GLUCOSE  --    < > 99 163* 87  BUN  --    < > 12 9 9   CREATININE  --    < > 1.10* 0.95 1.06*  CALCIUM  --    < > 8.9 9.0 8.8*  PROT 6.2*  --   --   --  6.8  ALBUMIN 3.0*  --   --   --  3.2*  AST 29  --   --   --  34  ALT 11  --   --   --  12  ALKPHOS 54  --   --   --  65  BILITOT 1.3*  --   --   --  1.1  GFRNONAA  --    < > 51* >60 54*  GFRAA  --    < > 59* >60 >60  ANIONGAP  --    < > 6 9 11    < > = values in this interval not displayed.     Hematology Recent Labs  Lab 04/14/18 0716 04/17/18 1233 04/18/18 0546  WBC 2.5* 3.0* 2.8*  RBC 3.45* 3.68* 4.14  HGB 7.7* 8.3* 9.5*  HCT 26.7* 29.5* 32.3*  MCV 77.4* 80.2 78.0*  MCH 22.3* 22.6* 22.9*  MCHC 28.8* 28.1* 29.4*  RDW 18.5* 18.6* 18.6*  PLT 57* 64* 65*    Cardiac Enzymes Recent Labs  Lab 04/12/18 0552  TROPONINI <0.03    Recent Labs  Lab 04/11/18 2115 04/17/18 1249  TROPIPOC 0.01 0.01     BNP Recent Labs  Lab 04/11/18 1715 04/17/18 1511  BNP 159.6* 140.2*     DDimer No results for input(s): DDIMER in the last 168 hours.   Radiology    Dg Chest 2 View  Result Date: 04/17/2018 CLINICAL DATA:  Dyspnea EXAM: CHEST - 2 VIEW COMPARISON:  04/11/2018 chest radiograph. FINDINGS: Stable configuration of sternotomy wires noting discontinuity in the lower most sternotomy wire. CABG clips overlie the mediastinum. Stable cardiomediastinal silhouette with cardiomegaly. No pneumothorax. No pleural effusion. Stable platelike scarring in the anterior left mid  lung. Hazy opacity in the right middle lobe with new silhouetting of the right heart border superimposed on chronic scarring. No overt pulmonary edema. IMPRESSION: 1. Hazy right middle lobe opacity superimposed on chronic scarring with new silhouetting of the right heart border, can not exclude right middle lobe pneumonia. Short-term chest radiograph follow-up advised. 2. Stable platelike scarring in the anterior left mid lung. 3. Stable cardiomegaly without overt pulmonary edema. Electronically Signed   By: Ilona Sorrel M.D.   On: 04/17/2018 13:05    Cardiac Studies   2D echo 11/15/17 Study Conclusions  - Left ventricle: The cavity size was normal. Wall thickness was   normal. Systolic function was mildly to moderately reduced. The   estimated ejection fraction was in the range of 40% to 45%.   Inferior and lateral hypokinesis. Doppler parameters are   consistent with pseudonormal left ventricular relaxation (grade 2   diastolic dysfunction). The E/e&' ratio is >15, suggesting   elevated LV filling pressure. - Left atrium: The atrium was mildly dilated. - Tricuspid valve: There was trivial regurgitation. - Pulmonary arteries: PA peak pressure: 37 mm Hg (S). - Inferior vena cava: The vessel was dilated. The respirophasic   diameter changes were blunted (< 50%), consistent with elevated   central venous pressure.  Impressions:  - Compared to a prior study in 04/2017, the LVEF is lower at 40-45%   with inferior and lateral wall hypokinesis, grade 2 DD and   elevated LV filling pressure.  Patient Profile     Samantha [name] is a 68 y.o. female with hx of chronic systolic / diastolic CHF  LVEF 40 to 52% (echo Sept 2019) , CAD (remote CABG in 2001; hx of multiple PCIs) nonalcoholic cirrhosis, varices, persistant afib (not on anticoagulation)   Presents to ED with SOB. Found to have worsening anemia and also in acute CHF w/ volume overload.   Assessment & Plan    1. Acute on chronic  combined systolic and diastolic  CHF (EF 40-45%): She continues to endorse dyspnea, orthopnea and PND. Head of bed remains elevated. No significant LEE noted on exam but elevated JVD present. She needs additional diuresis. She is currently on IV Lasix, 40 mg BID and notes good urinary response thus far. I/Os document -1.5L out yesterday. Continue to tract I/IOs. Monitor renal function and electrolytes. Low sodium diet. BP has been soft. Monitor closely. Home ARB currently on hold due to BP. She is getting metoprolol. Per office notes, she did not tolerate Entresto. Restart ARB once BP is more stable.   2. Anemia/Pancytopenia.  : s/p transfusion. Hgb improved from 8.3>>9.5 today. Further monitoring and management per IM.   3. CAD: s/p remote CABG w/ h/o multiple interventions. She denies any recent or active CP. Continue  blocker and statin. Not on ASA due to chronic thrombocytopenia.   4. Persistent Atrial Fibrillation: CVR on tele w/ rates in the 70s-80s. Rate is controlled w/ metoprolol. Continue current dose. Not on anticoagulation due to bleed risk. She has chronic anemia, thrombocytopenia and hepatic cirrhosis w/ known esophageal varices. Will supplement K. Continue to monitor electrolytes while diuresing.   5. HLD: on statin therapy for CAD. Lipitor 20 mg. LDL has historically been well controlled since 2016. Last lipid panel was almost 1 yr ago, 04/2017. LDL was 54. Will need updated lipid panel soon, but can be done as outpatient.  Also has cirrhosis but CMP 04/18/18 showed normal hepatic enzyme levels.   6. Hepatic cirrhosis w/ known esophageal Varices: followed by outpatient GI. She is on an oral  blocker, metoprolol.   7. Stage III CKD: slight bump in SCr overnight, w/ diuresis. SCr up from 0.95>>1.06. Continue to monitor while diuresing w/ IV Lasix.   8. Hypokalemia: K 3.3 today after getting IV Lasix. Will order supplemental K. Kdur 40 mEq x 1. F/u BMP in the AM.   For questions or  updates, please contact [place] Please consult [email or web address] for contact info under        Signed, Lyda Jester, PA-C  04/18/2018, 9:29 AM    Pt seen and examined   I agree with findings as noted by B Simmons above Pt still SOB since transfusion.    On exm: JVP is elevated Lungs are rel clear Cardiac exam  Irreg irreg   No S3 Ext are without edema  Continue IV lasix    Strict I/O  Hgb improved from yesterday     Follow   Rates for afib controlled  Not on anticoaguluation due to anemia, thrombocytopenia  Dorris Carnes

## 2018-04-18 NOTE — Care Management Obs Status (Signed)
[place] NOTIFICATION   Patient Details  Name: Samantha [name] MRN: [id number] Date of Birth: December 26, 1950   Medicare Observation Status Notification Given:  Yes    Carles Collet, RN 04/18/2018, 1:30 PM

## 2018-04-19 LAB — PROCALCITONIN: Procalcitonin: <0.1 ng/mL

## 2018-04-19 LAB — CBC
HCT: 30.2 % — ABNORMAL LOW (ref 36.0–46.0)
Hemoglobin: 8.8 g/dL — ABNORMAL LOW (ref 12.0–15.0)
MCH: 23.2 pg — ABNORMAL LOW (ref 26.0–34.0)
MCHC: 29.1 g/dL — ABNORMAL LOW (ref 30.0–36.0)
MCV: 79.5 fL — ABNORMAL LOW (ref 80.0–100.0)
Platelets: 59 10*3/uL — ABNORMAL LOW (ref 150–400)
RBC: 3.8 MIL/uL — ABNORMAL LOW (ref 3.87–5.11)
RDW: 18.6 % — ABNORMAL HIGH (ref 11.5–15.5)
WBC: 2.2 10*3/uL — ABNORMAL LOW (ref 4.0–10.5)
nRBC: 0 % (ref 0.0–0.2)

## 2018-04-19 LAB — COMPREHENSIVE METABOLIC PANEL
ALT: 12 U/L (ref 0–44)
AST: 32 U/L (ref 15–41)
Albumin: 3.2 g/dL — ABNORMAL LOW (ref 3.5–5.0)
Alkaline Phosphatase: 63 U/L (ref 38–126)
Anion gap: 12 (ref 5–15)
BUN: 9 mg/dL (ref 8–23)
CO2: 24 mmol/L (ref 22–32)
Calcium: 8.6 mg/dL — ABNORMAL LOW (ref 8.9–10.3)
Chloride: 105 mmol/L (ref 98–111)
Creatinine, Ser: 0.92 mg/dL (ref 0.44–1.00)
GFR calc Af Amer: >60 mL/min (ref >60)
GFR calc non Af Amer: >60 mL/min (ref >60)
Glucose, Bld: 90 mg/dL (ref 70–99)
Potassium: 3.5 mmol/L (ref 3.5–5.1)
Sodium: 141 mmol/L (ref 135–145)
Total Bilirubin: 1.3 mg/dL — ABNORMAL HIGH (ref 0.3–1.2)
Total Protein: 6.7 g/dL (ref 6.5–8.1)

## 2018-04-19 LAB — RETICULOCYTES
Immature Retic Fract: 19.2 % — ABNORMAL HIGH (ref 2.3–15.9)
RBC.: 3.88 MIL/uL (ref 3.87–5.11)
Retic Count, Absolute: 59.8 10*3/uL (ref 19.0–186.0)
Retic Ct Pct: 1.5 % (ref 0.4–3.1)

## 2018-04-19 LAB — BASIC METABOLIC PANEL
Anion gap: 11 (ref 5–15)
BUN: 8 mg/dL (ref 8–23)
CO2: 26 mmol/L (ref 22–32)
Calcium: 8.4 mg/dL — ABNORMAL LOW (ref 8.9–10.3)
Chloride: 103 mmol/L (ref 98–111)
Creatinine, Ser: 0.85 mg/dL (ref 0.44–1.00)
GFR calc Af Amer: >60 mL/min (ref >60)
GFR calc non Af Amer: >60 mL/min (ref >60)
Glucose, Bld: 151 mg/dL — ABNORMAL HIGH (ref 70–99)
Potassium: 3 mmol/L — ABNORMAL LOW (ref 3.5–5.1)
Sodium: 140 mmol/L (ref 135–145)

## 2018-04-19 LAB — VITAMIN B12: Vitamin B-12: 595 pg/mL (ref 180–914)

## 2018-04-19 LAB — FOLATE: Folate: 15.2 ng/mL (ref >5.9)

## 2018-04-19 LAB — LEGIONELLA PNEUMOPHILA SEROGP 1 UR AG: L. pneumophila Serogp 1 Ur Ag: NEGATIVE

## 2018-04-19 LAB — IRON AND TIBC
Iron: 20 ug/dL — ABNORMAL LOW (ref 28–170)
Saturation Ratios: 5 % — ABNORMAL LOW (ref 10.4–31.8)
TIBC: 398 ug/dL (ref 250–450)
UIBC: 378 ug/dL

## 2018-04-19 LAB — PROTIME-INR
INR: 1.4
Prothrombin Time: 17 seconds — ABNORMAL HIGH (ref 11.4–15.2)

## 2018-04-19 LAB — FERRITIN: Ferritin: 8 ng/mL — ABNORMAL LOW (ref 11–307)

## 2018-04-19 LAB — APTT: aPTT: 35 seconds (ref 24–36)

## 2018-04-19 MED ORDER — POTASSIUM CHLORIDE CRYS ER 20 MEQ PO TBCR
40.0000 meq | EXTENDED_RELEASE_TABLET | Freq: Once | ORAL | Status: AC
  Administered 2018-04-19: 40 meq via ORAL
  Filled 2018-04-19: qty 2

## 2018-04-19 MED ORDER — POTASSIUM CHLORIDE 10 MEQ/100ML IV SOLN
10.0000 meq | INTRAVENOUS | Status: AC
  Administered 2018-04-19: 10 meq via INTRAVENOUS
  Filled 2018-04-19: qty 100

## 2018-04-19 MED ORDER — POTASSIUM CHLORIDE 10 MEQ/100ML IV SOLN
10.0000 meq | INTRAVENOUS | Status: AC
  Administered 2018-04-19 (×2): 10 meq via INTRAVENOUS
  Filled 2018-04-19 (×2): qty 100

## 2018-04-19 MED ORDER — FUROSEMIDE 10 MG/ML IJ SOLN
60.0000 mg | Freq: Once | INTRAMUSCULAR | Status: AC
  Administered 2018-04-19: 60 mg via INTRAVENOUS
  Filled 2018-04-19: qty 6

## 2018-04-19 NOTE — Progress Notes (Addendum)
Pt transferred to unit via staff in stable condition. Pt placed on tele, bed in lowest position, and call light within reach. Pt instructed to call when help is needed. Skin is dry and intact. Pt is in no distress. Will continue to monitor.

## 2018-04-19 NOTE — Progress Notes (Signed)
I agree with previous nurse's assessment. 

## 2018-04-19 NOTE — Progress Notes (Signed)
Progress Note  Patient Name: Samantha [name] Date of Encounter: 04/19/2018  Primary Cardiologist: Sherren Mocha, MD   Subjective   Still SOB with activity   No CP    Inpatient Medications    Scheduled Meds: . sodium chloride   Intravenous Once  . atorvastatin  20 mg Oral QPM  . ezetimibe  10 mg Oral QHS  . FLUoxetine  40 mg Oral Daily  . furosemide  40 mg Intravenous Q12H  . ipratropium-albuterol  3 mL Nebulization BID  . isosorbide mononitrate  90 mg Oral Daily  . metoprolol succinate  25 mg Oral Daily  . pantoprazole  40 mg Oral Daily  . sodium chloride flush  3 mL Intravenous Once  . sodium chloride flush  3 mL Intravenous Q12H  . tobramycin-dexamethasone  1 drop Both Eyes QID   Continuous Infusions: . sodium chloride     PRN Meds: sodium chloride, calcium carbonate, HYDROcodone-acetaminophen, nitroGLYCERIN, ondansetron **OR** ondansetron (ZOFRAN) IV, sodium chloride flush   Vital Signs    Vitals:   04/18/18 1535 04/18/18 1954 04/18/18 2326 04/19/18 0547  BP:   (!) 106/40 (!) 105/59  Pulse:   73 72  Resp:   18 18  Temp:   98.5 F (36.9 C) 98.4 F (36.9 C)  TempSrc:   Oral Oral  SpO2:  94% 94% 93%  Weight: 93.8 kg     Height:        Intake/Output Summary (Last 24 hours) at 04/19/2018 0913 Last data filed at 04/19/2018 0850 Gross per 24 hour  Intake 120 ml  Output 1300 ml  Net -1180 ml   Last 3 Weights 04/18/2018 04/17/2018 04/14/2018  Weight (lbs) 206 lb 12.7 oz 203 lb 196 lb 4.8 oz  Weight (kg) 93.8 kg 92.08 kg 89.041 kg      Telemetry    Atrial fibrillation w/ CVR in the 70s-80s - Personally Reviewed  ECG      Physical Exam   GEN: WF, moderately obese, in no acute distress   Neck: JVP is increased to below jaw   Cardiac: irregularly irregular rhythm, regular rate Respiratory: Clear to auscultation bilaterally. GI: Soft, nontender, non-distended  MS: No edema; No deformity. Neuro:  Nonfocal  Psych: Normal affect   Labs     Chemistry Recent Labs  Lab 04/14/18 0716 04/17/18 1233 04/18/18 0546  NA 140 140 140  K 3.9 4.1 3.3*  CL 107 106 103  CO2 27 25 26   GLUCOSE 99 163* 87  BUN 12 9 9   CREATININE 1.10* 0.95 1.06*  CALCIUM 8.9 9.0 8.8*  PROT  --   --  6.8  ALBUMIN  --   --  3.2*  AST  --   --  34  ALT  --   --  12  ALKPHOS  --   --  65  BILITOT  --   --  1.1  GFRNONAA 51* >60 54*  GFRAA 59* >60 >60  ANIONGAP 6 9 11      Hematology Recent Labs  Lab 04/17/18 1233 04/18/18 0546 04/19/18 0700  WBC 3.0* 2.8* 2.2*  RBC 3.68* 4.14 3.80*  HGB 8.3* 9.5* 8.8*  HCT 29.5* 32.3* 30.2*  MCV 80.2 78.0* 79.5*  MCH 22.6* 22.9* 23.2*  MCHC 28.1* 29.4* 29.1*  RDW 18.6* 18.6* 18.6*  PLT 64* 65* 59*    Cardiac Enzymes No results for input(s): TROPONINI in the last 168 hours.  Recent Labs  Lab 04/17/18 1249  TROPIPOC 0.01  BNP Recent Labs  Lab 04/17/18 1511  BNP 140.2*     DDimer No results for input(s): DDIMER in the last 168 hours.   Radiology    Dg Chest 2 View  Result Date: 04/17/2018 CLINICAL DATA:  Dyspnea EXAM: CHEST - 2 VIEW COMPARISON:  04/11/2018 chest radiograph. FINDINGS: Stable configuration of sternotomy wires noting discontinuity in the lower most sternotomy wire. CABG clips overlie the mediastinum. Stable cardiomediastinal silhouette with cardiomegaly. No pneumothorax. No pleural effusion. Stable platelike scarring in the anterior left mid lung. Hazy opacity in the right middle lobe with new silhouetting of the right heart border superimposed on chronic scarring. No overt pulmonary edema. IMPRESSION: 1. Hazy right middle lobe opacity superimposed on chronic scarring with new silhouetting of the right heart border, can not exclude right middle lobe pneumonia. Short-term chest radiograph follow-up advised. 2. Stable platelike scarring in the anterior left mid lung. 3. Stable cardiomegaly without overt pulmonary edema. Electronically Signed   By: Ilona Sorrel M.D.   On: 04/17/2018  13:05    Cardiac Studies   2D echo 11/15/17 Study Conclusions  - Left ventricle: The cavity size was normal. Wall thickness was   normal. Systolic function was mildly to moderately reduced. The   estimated ejection fraction was in the range of 40% to 45%.   Inferior and lateral hypokinesis. Doppler parameters are   consistent with pseudonormal left ventricular relaxation (grade 2   diastolic dysfunction). The E/e&' ratio is >15, suggesting   elevated LV filling pressure. - Left atrium: The atrium was mildly dilated. - Tricuspid valve: There was trivial regurgitation. - Pulmonary arteries: PA peak pressure: 37 mm Hg (S). - Inferior vena cava: The vessel was dilated. The respirophasic   diameter changes were blunted (< 50%), consistent with elevated   central venous pressure.  Impressions:  - Compared to a prior study in 04/2017, the LVEF is lower at 40-45%   with inferior and lateral wall hypokinesis, grade 2 DD and   elevated LV filling pressure.  Patient Profile     Samantha [name] is a 68 y.o. female with hx of chronic systolic / diastolic CHF  LVEF 40 to 17% (echo Sept 2019) , CAD (remote CABG in 2001; hx of multiple PCIs) nonalcoholic cirrhosis, varices, persistant afib (not on anticoagulation)   Presents to ED with SOB. Found to have worsening anemia and also in acute CHF w/ volume overload.   Assessment & Plan    1. Acute on chronic combined systolic and diastolic CHF (EF 79-39%):  I think fluid is still up with increased JVP   Repeat BMET pending     Will give additonal lasix in afternoon and evening     2. Anemia/Pancytopenia.  : Hgb 8.8 today   Plt 59  WBC 2.2    Note guaiac is + on protonix Would get anemia panel   prob benefit from Fe infusion   Not tolerating PO and absorp probably down    3. CAD:   No anginal symptoms    Not on ASA with Plt and anemia  4. Persistent Atrial Fibrillation:  Continue on rate control   Not on anticoagulation     5. HLD:   Contineu lipitor    6. Hepatic cirrhosis w/ known esophageal Varices: followed by outpatient GI. She is on an oral  blocker, metoprolol.   7. Stage III CKD: Check BMET today  8. Hypokalemia: Repeat BMET  For questions or updates, please contact [place] Please consult  [email or web address] for contact info under        Signed, Dorris Carnes, MD  04/19/2018, 9:13 AM

## 2018-04-19 NOTE — Progress Notes (Signed)
[facility]  PROGRESS NOTE  Samantha [name] FBP:[id number] DOB: 15-Feb-1951 DOA: 04/17/2018 PCP: Samantha Hilding, MD   Brief HPI:   68 y/o female with history of combined systolic and diastolic heart failure, ejection fraction 40 to 45%, CAD status post CABG 2001, multiple PCI, cirrhosis of liver, CKD stage III, depression, esophageal varices diagnosed in 2013, pancytopenia, persistent A. fib not on anticoagulation recently discharged from the hospital on 04/14/2018 by cardiology service after she was treated for acute CHF exacerbation.  Since discharge patient has become more short of breath despite taking torsemide.    Subjective   This morning patient is breathing better.   Assessment/Plan:     1. Acute on chronic combined systolic and diastolic CHF-patient started on IV Lasix.  Continue Lasix 40 mg IV every 12 hours.  Continue metoprolol, Imdur.  Cardiology has been consulted.  2. ? Pneumonia-chest x-ray shows infiltrate, patient started on Zithromax.She has been afebrile in the hospital.  3. Chronic atrial fibrillation-patient is not a candidate for anticoagulation due to anemia and thrombocytopenia with guaiac positive stool.  Continue metoprolol.  4. Samantha [name] cirrhosis/guaiac positive stool-FOBT is positive, patient does have history of liver cirrhosis with esophageal varices.  No active bleeding.  Hemoglobin is stable at 8.8.  Patient follows with Samantha [name] as outpatient.  She has an appointment to see him on March 3rd.  5. 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  Follow CBC and transfuse for hemoglobin less than 7.   6. Hypokalemia-potassium was 3.0, will replace potassium and check BMP in a.m.        CBG: Recent Labs  Lab 04/18/18 0710  GLUCAP 88    CBC: Recent Labs  Lab 04/13/18 0304 04/14/18 0716 04/17/18 1233 04/18/18 0546 04/19/18 0700  WBC 3.1* 2.5* 3.0* 2.8* 2.2*  HGB 7.7* 7.7* 8.3* 9.5* 8.8*  HCT 27.5* 26.7* 29.5* 32.3*  30.2*  MCV 77.9* 77.4* 80.2 78.0* 79.5*  PLT 63* 57* 64* 65* 59*    Basic Metabolic Panel: Recent Labs  Lab 04/14/18 0716 04/17/18 1233 04/18/18 0546 04/19/18 0700 04/19/18 1106  NA 140 140 140 141 140  K 3.9 4.1 3.3* 3.5 3.0*  CL 107 106 103 105 103  CO2 27 25 26 24 26   GLUCOSE 99 163* 87 90 151*  BUN 12 9 9 9 8   CREATININE 1.10* 0.95 1.06* 0.92 0.85  CALCIUM 8.9 9.0 8.8* 8.6* 8.4*     DVT prophylaxis: SCDs  Code Status: Full code  Family Communication: No family at bedside  Disposition Plan: likely home when medically ready for discharge     Consultants:  Cardiology  Procedures:     Antibiotics:   Anti-infectives (From admission, onward)   Start     Dose/Rate Route Frequency Ordered Stop   04/17/18 1745  azithromycin (ZITHROMAX) tablet 500 mg  Status:  Discontinued     500 mg Oral Daily with supper 04/17/18 1733 04/19/18 0501       Objective   Vitals:   04/18/18 2326 04/19/18 0547 04/19/18 1300 04/19/18 1426  BP: (!) 106/40 (!) 105/59 (!) 107/49 (!) 116/47  Pulse: 73 72 70 69  Resp: 18 18 20 18   Temp: 98.5 F (36.9 C) 98.4 F (36.9 C) 98.5 F (36.9 C) 98.3 F (36.8 C)  TempSrc: Oral Oral Oral Oral  SpO2: 94% 93% 96% 97%  Weight:      Height:        Intake/Output Summary (Last 24 hours) at 04/19/2018 1514  Last data filed at 04/19/2018 0850 Gross per 24 hour  Intake -  Output 1050 ml  Net -1050 ml   Filed Weights   04/17/18 1726 04/18/18 1535  Weight: 92.1 kg 93.8 kg     Physical Examination:    General: Appears in no acute distress  Cardiovascular: S1-S2, regular, positive JVD  Respiratory: Clear to auscultation bilaterally  Abdomen: Abdomen soft, nontender, no organomegaly  Extremities: No edema in the lower extremities  Neurologic:  AO x 3, no focal defecit     Data Reviewed: I have personally reviewed following labs and imaging studies   No results found for this or any previous visit (from the past 240  hour(s)).   Liver Function Tests: Recent Labs  Lab 04/18/18 0546 04/19/18 0700  AST 34 32  ALT 12 12  ALKPHOS 65 63  BILITOT 1.1 1.3*  PROT 6.8 6.7  ALBUMIN 3.2* 3.2*   No results for input(s): LIPASE, AMYLASE in the last 168 hours. No results for input(s): AMMONIA in the last 168 hours.  Cardiac Enzymes: No results for input(s): CKTOTAL, CKMB, CKMBINDEX, TROPONINI in the last 168 hours. BNP (last 3 results) Recent Labs    06/06/17 1454 04/11/18 1715 04/17/18 1511  BNP 33.6 159.6* 140.2*    ProBNP (last 3 results) No results for input(s): PROBNP in the last 8760 hours.    Studies: No results found.  Scheduled Meds: . sodium chloride   Intravenous Once  . atorvastatin  20 mg Oral QPM  . ezetimibe  10 mg Oral QHS  . FLUoxetine  40 mg Oral Daily  . furosemide  40 mg Intravenous Q12H  . ipratropium-albuterol  3 mL Nebulization BID  . isosorbide mononitrate  90 mg Oral Daily  . metoprolol succinate  25 mg Oral Daily  . pantoprazole  40 mg Oral Daily  . sodium chloride flush  3 mL Intravenous Once  . sodium chloride flush  3 mL Intravenous Q12H  . tobramycin-dexamethasone  1 drop Both Eyes QID    Admission status: Inpatient: Based on patients clinical presentation and evaluation of above clinical data, I have made determination that patient meets Inpatient criteria at this time.  Time spent: 20 min  [facility] Pager [phone]. If 7PM-7AM, please contact night-coverage at [email or web address], Office  [phone]  password [email or web address]  04/19/2018, 3:14 PM  LOS: 1 day

## 2018-04-19 NOTE — Care Management Note (Signed)
Case Management Note  Patient Details  Name: Samantha [name] MRN: [id number] Date of Birth: 1950-06-14  Subjective/Objective: 68 yo female presented with SOB on 04/17/18; patient is a readmit who transitioned home on 04/14/18.                Action/Plan: Patient lives at home and was independent PTA. PCP:Dr. [name]; pharmacy of choice: [facility]; patient has private health insurance with [facility] with prescription drug coverage; CM will continue to follow for progression of care.   Expected Discharge Date:  04/18/18               Expected Discharge Plan:  [place]  In-House Referral:  NA  Discharge planning Services  CM Consult  Post Acute Care Choice:  NA Choice offered to:  NA  DME Arranged:  N/A DME Agency:  NA  HH Arranged:  NA HH Agency:  NA  Status of Service:  In process, will continue to follow  If discussed at Long Length of Stay Meetings, dates discussed:    Additional Comments:  Midge Minium RN, BSN, NCM-BC, ACM-RN [phone] 04/19/2018, 2:11 PM

## 2018-04-20 DIAGNOSIS — D509 Iron deficiency anemia, unspecified: Secondary | ICD-10-CM

## 2018-04-20 LAB — BASIC METABOLIC PANEL
Anion gap: 8 (ref 5–15)
BUN: 7 mg/dL — ABNORMAL LOW (ref 8–23)
CO2: 27 mmol/L (ref 22–32)
Calcium: 8.7 mg/dL — ABNORMAL LOW (ref 8.9–10.3)
Chloride: 104 mmol/L (ref 98–111)
Creatinine, Ser: 0.8 mg/dL (ref 0.44–1.00)
GFR calc Af Amer: >60 mL/min (ref >60)
GFR calc non Af Amer: >60 mL/min (ref >60)
Glucose, Bld: 91 mg/dL (ref 70–99)
Potassium: 3.9 mmol/L (ref 3.5–5.1)
Sodium: 139 mmol/L (ref 135–145)

## 2018-04-20 LAB — CBC
HCT: 30.7 % — ABNORMAL LOW (ref 36.0–46.0)
Hemoglobin: 8.6 g/dL — ABNORMAL LOW (ref 12.0–15.0)
MCH: 22.3 pg — ABNORMAL LOW (ref 26.0–34.0)
MCHC: 28 g/dL — ABNORMAL LOW (ref 30.0–36.0)
MCV: 79.7 fL — ABNORMAL LOW (ref 80.0–100.0)
Platelets: 63 10*3/uL — ABNORMAL LOW (ref 150–400)
RBC: 3.85 MIL/uL — ABNORMAL LOW (ref 3.87–5.11)
RDW: 18.8 % — ABNORMAL HIGH (ref 11.5–15.5)
WBC: 2.7 10*3/uL — ABNORMAL LOW (ref 4.0–10.5)
nRBC: 0 % (ref 0.0–0.2)

## 2018-04-20 LAB — MAGNESIUM: Magnesium: 1.9 mg/dL (ref 1.7–2.4)

## 2018-04-20 MED ORDER — SODIUM CHLORIDE 0.9 % IV SOLN: 510.0000 mg | Freq: Once | INTRAVENOUS | Status: DC

## 2018-04-20 MED ORDER — HYDROCORTISONE ACETATE 25 MG RE SUPP: 25.0000 mg | Freq: Two times a day (BID) | RECTAL | 0 refills | Status: AC

## 2018-04-20 MED ORDER — FUROSEMIDE 10 MG/ML IJ SOLN
80.0000 mg | Freq: Two times a day (BID) | INTRAMUSCULAR | Status: DC
  Filled 2018-04-20: qty 8

## 2018-04-20 MED ORDER — POTASSIUM CHLORIDE ER 20 MEQ PO TBCR: 20.0000 meq | EXTENDED_RELEASE_TABLET | Freq: Every day | ORAL | 2 refills | Status: DC

## 2018-04-20 MED ORDER — POLYETHYLENE GLYCOL 3350 17 G PO PACK
17.0000 g | PACK | Freq: Every day | ORAL | Status: DC
  Administered 2018-04-20: 17 g via ORAL
  Filled 2018-04-20: qty 1

## 2018-04-20 MED ORDER — SODIUM CHLORIDE 0.9 % IV SOLN
510.0000 mg | Freq: Once | INTRAVENOUS | Status: AC
  Administered 2018-04-20: 510 mg via INTRAVENOUS
  Filled 2018-04-20: qty 17

## 2018-04-20 MED ORDER — POTASSIUM CHLORIDE CRYS ER 20 MEQ PO TBCR
20.0000 meq | EXTENDED_RELEASE_TABLET | Freq: Every day | ORAL | Status: DC
  Administered 2018-04-20: 20 meq via ORAL
  Filled 2018-04-20: qty 1

## 2018-04-20 MED ORDER — HYDROCORTISONE ACETATE 25 MG RE SUPP
25.0000 mg | Freq: Two times a day (BID) | RECTAL | Status: DC
  Administered 2018-04-20: 25 mg via RECTAL
  Filled 2018-04-20: qty 1

## 2018-04-20 NOTE — Progress Notes (Signed)
Patient had 7 beat run of vtach. While assessing patient, GI PA at bedside assessing patient.  BP 97/44. HR 67. Patient asymptomatic.

## 2018-04-20 NOTE — Progress Notes (Signed)
Pt given discharge instructions, prescriptions, and care notes. Pt verbalized understanding AEB no further questions or concerns at this time. IV was discontinued, no redness, pain, or swelling noted at this time. Telemetry discontinued and Centralized Telemetry was notified. Pt left the floor via wheelchair with staff in stable condition. Skin dry and intact. 

## 2018-04-20 NOTE — Discharge Summary (Signed)
Physician Discharge Summary  Samantha [name] DGU:[id number] DOB: Jan 29, 1951 DOA: 04/17/2018  PCP: Samantha Hilding, MD  Admit date: 04/17/2018 Discharge date: 04/20/2018  Time spent: 50* minutes  Recommendations for Outpatient Follow-up:  1. Follow up cardiology in 2 weeks 2. Check BMP as outpatient in 2 weeks   Discharge Diagnoses:  Active Problems:   CAD (coronary artery disease)   Essential hypertension   S/P CABG x 3- 2001   Anemia   Cirrhosis, non-alcoholic (HCC)   Cardiomyopathy, ischemic-35-40% by cath    Persistent atrial fibrillation   Chronic combined systolic and diastolic CHF (congestive heart failure) (HCC)   Heart failure (HCC)   CHF (congestive heart failure) ([place])   Discharge Condition: Stable  Diet recommendation: Heart healthy diet  Filed Weights   04/17/18 1726 04/18/18 1535 04/20/18 0318  Weight: 92.1 kg 93.8 kg 91.9 kg    History of present illness:  68 y/o female with history of combined systolic and diastolic heart failure, ejection fraction 40 to 45%, CAD status post CABG 2001, multiple PCI, cirrhosis of liver, CKD stage III, depression, esophageal varices diagnosed in 2013, pancytopenia, persistent A. fib not on anticoagulation recently discharged from the hospital on 04/14/2018 by cardiology service after she was treated for acute CHF exacerbation.  Since discharge patient has become more short of breath despite taking torsemide.   Hospital Course:   1. Acute on chronic combined systolic and diastolic CHF-patient started on IV Lasix 40 mg IV every 12 hours.  Continued on  metoprolol, Imdur.  Cardiology w as consulted, at this time she is back to baseline. Cardiology has cleared for discharge. Continue Torsemide 40 mg po daily.  2. ? Pneumonia-chest x-ray shows infiltrate, patient  was started on Zithromax, which was discontinued on 04/19/18 after her procalcitonin was less than 0.10 x 2.She has been afebrile in the hospital. She will need  a repeat CXR  in 3-4 weeks to check complete resolution of infiltrate.  3. Chronic atrial fibrillation-patient is not a candidate for anticoagulation due to anemia and thrombocytopenia with guaiac positive stool.  Continue metoprolol.  4. Samantha [name] cirrhosis/guaiac positive stool-FOBT is positive, patient does have history of liver cirrhosis with esophageal varices.  No active bleeding.  Hemoglobin is stable at 8.8.  Patient follows with Dr. Corbin [name] as outpatient. This morning she has BRBPR, GI was consulted and GI has recommended to start Anusol suppository twice daily x 5 days. She can follow up Dr Samantha [name] as outpatient  5. 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  6. Hypokalemia-potassium replete.   7. NSVT- patient had 7 beat run of NSVT. Serum magnesium checked today 1.9. Called and discussed with Dr Samantha [name], who says it is ok to discharge home.   Procedures:    Consultations:  Cardiology   Discharge Exam: Vitals:   04/20/18 1321 04/20/18 1417  BP: (!) 116/49 (!) 97/44  Pulse: 71 67  Resp: 18   Temp: 99.1 F (37.3 C)   SpO2: 98%     General: Appears in no acute distress Cardiovascular: S1S2 RRR Respiratory: Clear bilaterally  Discharge Instructions   Discharge Instructions    Diet - low sodium heart healthy   Complete by:  As directed    Increase activity slowly   Complete by:  As directed      Allergies as of 04/20/2018      Reactions   Entresto [sacubitril-valsartan] Swelling   Swelling of eyes   Acetaminophen Other (See Comments)  Liver problems   Oxycodone Itching, Nausea Only   Ace Inhibitors Other (See Comments)   UNSURE OF REACTION TYPE   Cefaclor Itching, Rash   Cephalexin Itching, Rash   Penicillins Rash   Has patient had a PCN reaction causing immediate rash, facial/tongue/throat swelling, SOB or lightheadedness with hypotension: YES Has patient had a PCN reaction causing severe rash involving mucus  membranes or skin necrosis: NO Has patient had a PCN reaction that required hospitalization: YES Has patient had a PCN reaction occurring within the last 10 years: NO If all of the above answers are "NO", then may proceed with Cephalosporin use.   Pregabalin Other (See Comments)   Retains fluid   Tape Itching, Rash   Takes skin right off with medical tape--PAPER TAPE ONLY Other reaction(s): Itching Takes skin right off with medical tape--PAPER TAPE ONLY Takes skin right off with medical tape--PAPER TAPE ONLY      Medication List    TAKE these medications   albuterol 108 (90 Base) MCG/ACT inhaler Commonly known as:  PROVENTIL HFA;VENTOLIN HFA Inhale 2 puffs into the lungs every 6 (six) hours as needed for wheezing or shortness of breath.   atorvastatin 20 MG tablet Commonly known as:  LIPITOR Take 20 mg by mouth every evening.   calcium carbonate 500 MG chewable tablet Commonly known as:  TUMS - dosed in mg elemental calcium Chew 1 tablet by mouth daily as needed for indigestion or heartburn.   ezetimibe 10 MG tablet Commonly known as:  ZETIA Take 10 mg by mouth at bedtime.   FLUoxetine 40 MG capsule Commonly known as:  PROZAC Take 40 mg by mouth daily.   HYDROcodone-acetaminophen 10-325 MG tablet Commonly known as:  NORCO Take 1 tablet by mouth as needed for pain.   hydrocortisone 25 MG suppository Commonly known as:  ANUSOL-HC Place 1 suppository (25 mg total) rectally 2 (two) times daily for 5 days.   isosorbide mononitrate 60 MG 24 hr tablet Commonly known as:  IMDUR Take 1.5 tablets (90 mg total) by mouth daily.   metoprolol succinate 25 MG 24 hr tablet Commonly known as:  TOPROL XL Take 1 tablet (25 mg total) by mouth daily.   NITROSTAT 0.4 MG SL tablet Generic drug:  nitroGLYCERIN Place 0.4 mg under the tongue every 5 (five) minutes as needed for chest pain (x 3 tabs daily).   pantoprazole 40 MG tablet Commonly known as:  PROTONIX Take 40 mg by mouth  daily.   polyethylene glycol packet Commonly known as:  MIRALAX / GLYCOLAX Take 17 g by mouth daily.   Potassium Chloride ER 20 MEQ Tbcr Take 20 mEq by mouth daily.   tobramycin-dexamethasone ophthalmic solution Commonly known as:  TOBRADEX Place 1 drop into both eyes 4 (four) times daily.   torsemide 20 MG tablet Commonly known as:  DEMADEX Take 2 tablets (40 mg total) by mouth daily.   valsartan 160 MG tablet Commonly known as:  DIOVAN Take 1 tablet (160 mg total) by mouth at bedtime.      Allergies  Allergen Reactions  . Entresto [Sacubitril-Valsartan] Swelling    Swelling of eyes  . Acetaminophen Other (See Comments)    Liver problems  . Oxycodone Itching and Nausea Only  . Ace Inhibitors Other (See Comments)    UNSURE OF REACTION TYPE  . Cefaclor Itching and Rash  . Cephalexin Itching and Rash  . Penicillins Rash    Has patient had a PCN reaction causing immediate rash, facial/tongue/throat swelling,  SOB or lightheadedness with hypotension: YES Has patient had a PCN reaction causing severe rash involving mucus membranes or skin necrosis: NO Has patient had a PCN reaction that required hospitalization: YES Has patient had a PCN reaction occurring within the last 10 years: NO If all of the above answers are "NO", then may proceed with Cephalosporin use.   . Pregabalin Other (See Comments)    Retains fluid  . Tape Itching and Rash    Takes skin right off with medical tape--PAPER TAPE ONLY Other reaction(s): Itching Takes skin right off with medical tape--PAPER TAPE ONLY Takes skin right off with medical tape--PAPER TAPE ONLY   Follow-up Information    Richardson Dopp T, PA-C Follow up.   Specialties:  Cardiology, Physician Assistant Why:  Cardiology hospital follow up on 05/02/2018 at 3:45. Please arrive 15 minutes early for checkin.  Contact information: [address] [place] [address] [phone]            The results of  significant diagnostics from this hospitalization (including imaging, microbiology, ancillary and laboratory) are listed below for reference.    Significant Diagnostic Studies: Dg Chest 2 View  Result Date: 04/17/2018 CLINICAL DATA:  Dyspnea EXAM: CHEST - 2 VIEW COMPARISON:  04/11/2018 chest radiograph. FINDINGS: Stable configuration of sternotomy wires noting discontinuity in the lower most sternotomy wire. CABG clips overlie the mediastinum. Stable cardiomediastinal silhouette with cardiomegaly. No pneumothorax. No pleural effusion. Stable platelike scarring in the anterior left mid lung. Hazy opacity in the right middle lobe with new silhouetting of the right heart border superimposed on chronic scarring. No overt pulmonary edema. IMPRESSION: 1. Hazy right middle lobe opacity superimposed on chronic scarring with new silhouetting of the right heart border, can not exclude right middle lobe pneumonia. Short-term chest radiograph follow-up advised. 2. Stable platelike scarring in the anterior left mid lung. 3. Stable cardiomegaly without overt pulmonary edema. Electronically Signed   By: Ilona Sorrel M.D.   On: 04/17/2018 13:05   Dg Chest 2 View  Result Date: 04/11/2018 CLINICAL DATA:  68 year old female with a history chest pain and shortness of breath EXAM: CHEST - 2 VIEW COMPARISON:  09/12/2007, 05/24/2017 FINDINGS: Cardiomediastinal silhouette unchanged in size and contour. Surgical changes of median sternotomy and CABG. Interstitial opacities of the lungs. Redemonstration linear opacity left lung. No pneumothorax or pleural effusion. No new confluent airspace disease. Osteopenia with no displaced fracture. Changes of prior augmentation lower thoracic vertebral body IMPRESSION: Chronic lung changes without evidence of acute cardiopulmonary disease. Cardiomegaly with surgical changes of median sternotomy and CABG. Electronically Signed   By: Corrie Mckusick D.O.   On: 04/11/2018 18:48     Microbiology: No results found for this or any previous visit (from the past 240 hour(s)).   Labs: Basic Metabolic Panel: Recent Labs  Lab 04/17/18 1233 04/18/18 0546 04/19/18 0700 04/19/18 1106 04/20/18 0403 04/20/18 1501  NA 140 140 141 140 139  --   K 4.1 3.3* 3.5 3.0* 3.9  --   CL 106 103 105 103 104  --   CO2 25 26 24 26 27   --   GLUCOSE 163* 87 90 151* 91  --   BUN 9 9 9 8  7*  --   CREATININE 0.95 1.06* 0.92 0.85 0.80  --   CALCIUM 9.0 8.8* 8.6* 8.4* 8.7*  --   MG  --   --   --   --   --  1.9   Liver Function Tests:  Recent Labs  Lab 04/18/18 0546 04/19/18 0700  AST 34 32  ALT 12 12  ALKPHOS 65 63  BILITOT 1.1 1.3*  PROT 6.8 6.7  ALBUMIN 3.2* 3.2*   No results for input(s): LIPASE, AMYLASE in the last 168 hours. No results for input(s): AMMONIA in the last 168 hours. CBC: Recent Labs  Lab 04/14/18 0716 04/17/18 1233 04/18/18 0546 04/19/18 0700 04/20/18 0403  WBC 2.5* 3.0* 2.8* 2.2* 2.7*  HGB 7.7* 8.3* 9.5* 8.8* 8.6*  HCT 26.7* 29.5* 32.3* 30.2* 30.7*  MCV 77.4* 80.2 78.0* 79.5* 79.7*  PLT 57* 64* 65* 59* 63*   Cardiac Enzymes: No results for input(s): CKTOTAL, CKMB, CKMBINDEX, TROPONINI in the last 168 hours. BNP: BNP (last 3 results) Recent Labs    06/06/17 1454 04/11/18 1715 04/17/18 1511  BNP 33.6 159.6* 140.2*    ProBNP (last 3 results) No results for input(s): PROBNP in the last 8760 hours.  CBG: Recent Labs  Lab 04/18/18 0710  GLUCAP 88      Signed:  Oswald Hillock MD.  [facility] 04/20/2018, 4:14 PM

## 2018-04-20 NOTE — Progress Notes (Signed)
Progress Note  Patient Name: Samantha [name] Date of Encounter: 04/20/2018  Primary Cardiologist: Sherren Mocha, MD   Subjective   Breathing is some better No CP   Inpatient Medications    Scheduled Meds: . sodium chloride   Intravenous Once  . atorvastatin  20 mg Oral QPM  . ezetimibe  10 mg Oral QHS  . FLUoxetine  40 mg Oral Daily  . furosemide  40 mg Intravenous Q12H  . ipratropium-albuterol  3 mL Nebulization BID  . isosorbide mononitrate  90 mg Oral Daily  . metoprolol succinate  25 mg Oral Daily  . pantoprazole  40 mg Oral Daily  . sodium chloride flush  3 mL Intravenous Once  . sodium chloride flush  3 mL Intravenous Q12H  . tobramycin-dexamethasone  1 drop Both Eyes QID   Continuous Infusions: . sodium chloride     PRN Meds: sodium chloride, calcium carbonate, HYDROcodone-acetaminophen, nitroGLYCERIN, ondansetron **OR** ondansetron (ZOFRAN) IV, sodium chloride flush   Vital Signs    Vitals:   04/19/18 2116 04/19/18 2200 04/20/18 0318 04/20/18 0656  BP: (!) 111/41   (!) 118/56  Pulse: 78   71  Resp: 16   16  Temp: 98.2 F (36.8 C)   98.5 F (36.9 C)  TempSrc: Oral   Oral  SpO2: 91% 94%  95%  Weight:   91.9 kg   Height:        Intake/Output Summary (Last 24 hours) at 04/20/2018 0737 Last data filed at 04/20/2018 0308 Gross per 24 hour  Intake 240 ml  Output 2170 ml  Net -1930 ml   Net neg 2.1 L   Last 3 Weights 04/20/2018 04/18/2018 04/17/2018  Weight (lbs) 202 lb 9.6 oz 206 lb 12.7 oz 203 lb  Weight (kg) 91.9 kg 93.8 kg 92.08 kg      Telemetry     Afib   - Personally Reviewed  ECG      Physical Exam   GEN: WF, moderately obese, in no acute distress   Neck: JVP is increased   Cardiac: irregularly irregular rhythm, regular rate Respiratory: Clear to auscultation bilaterally. GI: Soft, nontender, non-distended  MS: No edema; No deformity. Neuro:  Nonfocal  Psych: Normal affect   Labs    Chemistry Recent Labs  Lab 04/18/18 0546  04/19/18 0700 04/19/18 1106 04/20/18 0403  NA 140 141 140 139  K 3.3* 3.5 3.0* 3.9  CL 103 105 103 104  CO2 26 24 26 27   GLUCOSE 87 90 151* 91  BUN 9 9 8  7*  CREATININE 1.06* 0.92 0.85 0.80  CALCIUM 8.8* 8.6* 8.4* 8.7*  PROT 6.8 6.7  --   --   ALBUMIN 3.2* 3.2*  --   --   AST 34 32  --   --   ALT 12 12  --   --   ALKPHOS 65 63  --   --   BILITOT 1.1 1.3*  --   --   GFRNONAA 54* >60 >60 >60  GFRAA >60 >60 >60 >60  ANIONGAP 11 12 11 8      Hematology Recent Labs  Lab 04/18/18 0546 04/19/18 0700 04/19/18 1106 04/20/18 0403  WBC 2.8* 2.2*  --  2.7*  RBC 4.14 3.80* 3.88 3.85*  HGB 9.5* 8.8*  --  8.6*  HCT 32.3* 30.2*  --  30.7*  MCV 78.0* 79.5*  --  79.7*  MCH 22.9* 23.2*  --  22.3*  MCHC 29.4* 29.1*  --  28.0*  RDW 18.6* 18.6*  --  18.8*  PLT 65* 59*  --  63*    Cardiac Enzymes No results for input(s): TROPONINI in the last 168 hours.  Recent Labs  Lab 04/17/18 1249  TROPIPOC 0.01     BNP Recent Labs  Lab 04/17/18 1511  BNP 140.2*     DDimer No results for input(s): DDIMER in the last 168 hours.   Radiology    No results found.  Cardiac Studies   2D echo 11/15/17 Study Conclusions  - Left ventricle: The cavity size was normal. Wall thickness was   normal. Systolic function was mildly to moderately reduced. The   estimated ejection fraction was in the range of 40% to 45%.   Inferior and lateral hypokinesis. Doppler parameters are   consistent with pseudonormal left ventricular relaxation (grade 2   diastolic dysfunction). The E/e&' ratio is >15, suggesting   elevated LV filling pressure. - Left atrium: The atrium was mildly dilated. - Tricuspid valve: There was trivial regurgitation. - Pulmonary arteries: PA peak pressure: 37 mm Hg (S). - Inferior vena cava: The vessel was dilated. The respirophasic   diameter changes were blunted (< 50%), consistent with elevated   central venous pressure.  Impressions:  - Compared to a prior study in  04/2017, the LVEF is lower at 40-45%   with inferior and lateral wall hypokinesis, grade 2 DD and   elevated LV filling pressure.  Patient Profile     Samantha [name] is a 68 y.o. female with hx of chronic systolic / diastolic CHF  LVEF 40 to 82% (echo Sept 2019) , CAD (remote CABG in 2001; hx of multiple PCIs) nonalcoholic cirrhosis, varices, persistant afib (not on anticoagulation)   Presents to ED with SOB. Found to have worsening anemia and also in acute CHF w/ volume overload.   Assessment & Plan    1. Acute on chronic combined systolic and diastolic CHF (EF 95-62%):  Diuresing  I still think she still has some increased fluid   WOulg give 80 this AM since she will be getting iV iron.    May be able to go later today    DIscussed low Na   1500 cc limits     2. Anemia/Pancytopenia.  : Hgb 8.6   today   Plt 59  WBC 2.7 Note guaiac is + on protonix Will give Fe intravenous  Supplement    3. CAD:   No anginal symptoms    Not on ASA with Plt and anemia  4. Persistent Atrial Fibrillation:  Continue on rate control   Not on anticoagulation     5. HLD:  Continue lipitor    6. Hepatic cirrhosis w/ known esophageal Varices: followed by outpatient GI. She is on an oral  blocker, metoprolol.     8. Hypokalemia: Give K  After lasix today    For questions or updates, please contact [place] Please consult [email or web address] for contact info under        Signed, Dorris Carnes, MD  04/20/2018, 7:37 AM

## 2018-04-20 NOTE — Consult Note (Addendum)
[facility] Consult: 12:49 PM 04/20/2018  LOS: 2 days    Referring Provider: Dr Darrick Meigs  Primary Care Physician:  [name], MD Primary Gastroenterologist:  Dr. Laural Golden in [place].  Appointment for follow-up with him on 05/01/2018.   Reason for Consultation: PR.  Anemia.   HPI: Samantha [name] is a 68 y.o. female.  Hx A. fib, not a candidate for anticoagulation due to thrombocytopenia, anemia and history of rectal bleeding.  Anemia, pancytopenia.  Compression fracture, status post vertebroplasty 08/2017, required platelet transfusion prior to the procedure.  NASH cirrhosis.  Esophageal and gastric varices. 03/2011 Colonoscopy.  Average risk screening.  Scattered sigmoid diverticulosis otherwise normal study. EGDs with banding during EGDs of 06/2012, 08/2012 09/2013, 10/2014. 09/2015 EGD. Grade 1 and grade 2 distal esophageal varices.  Post variceal banding scar in the distal esophagus with healthy scar tissue.  Though there were no esophageal abnormalities to explain patient's complaint of dysphagia, Dr. Melony Overly empirically dilated the esophagus.  Portal hypertensive gastropathy.  Normal duodenum at bulb and D2. 11/2016 EGD with small esophageal varices, nonbleeding gastric varices.  No banding.    Developed hematochezia on 02/21/2018 and transferred to [facility].  CT scan showed splenomegaly, gastric varices.  Bleeding stopped and endoscopic/colonoscopic procedures canceled when she tested positive and was treated for influenza.  At discharge she was advised to undergo flexible sigmoidoscopy.  There was ? of anal fissure or internal hemorrhoids as she complained of painful defecation.  Followed up with Dr. Laural Golden on 03/20/2017 on which date she underwent flexible sigmoidoscopy.  Normal rectum and sigmoid.  External  hemorrhoids.  Fair colon prep. She has been seen twice by Dr. Laural Golden since then, her last visit was in 10/24/2017.   11/01/2017 abdominal ultrasound for [facility] screening showed cirrhosis, splenomegaly, normal directional flow of portal vein.  No focal masses. AFP 3.8 on 09/2016.  Patient was admitted 2/12 -04/14/2018 for acute heart failure. She was readmitted 04/18/18 with the same.  Difficulty breathing and lower extremity edema.  Has responded well to IV Lasix.  On Zithromax for lung infiltrate/pneumonia.  MD correcting hypokalemia.  Cardiology has seen the patient, has outlined low sodium and 1500 cc fluid limited diet.  A. fib is rate controlled.  Patient had a somewhat difficult to pass bowel movement this morning.  She passed a small amount of stool and a large amount of blood.  Within an hour she passed a nonbloody, loose brown stool.  She is having burning in her rectum, this is not severe.  She feels well.  Patient normally takes a capful of Lasix in her morning coffee every day and this controls her tendency to constipation as well as rectal bleeding.  She has not had rectal bleeding for over 1 year.  Hgb since admission 8.8 >> 9 >> 7.7 >> 1 U PRBC >>8.3 >>  9.5 >> 8.8 >> 8.6.   MCV 79.  Platelets 63K.  WBCs 2.7. Anemia studies support diagnosis of iron deficiency anemia with iron of 20 and ferritin 8. PT/INR 17/1.4 BUN is not elevated.  Past Medical History:  Diagnosis Date  . Chronic combined systolic and diastolic CHF (congestive heart failure) ([place])   . Cirrhosis of liver ([place])   . CKD (chronic kidney disease), stage III ([place])   . Coronary artery disease    a. s/p CABG 2001 w/ (LIMA-OM, SVG-D1, SVG-RCA). b. h/o multiple PCIs per Dr. Antionette Char note.  . Depression   . Esophageal varices ([place])    New 2013  . Gastroesophageal reflux disease   . History of pneumonia   . Hyperlipidemia   . Hypertension   . Obesity   . Osteoarthritis   . Pancytopenia ([place])   . Paroxysmal atrial  flutter ([place])    a. dx 05/2016.  [name] Persistent atrial fibrillation    a. reported in hosp 07/2016, not on anticoag due to cirrhosis and liver disease, low platelets, varices.  . Thrombocytopenia ([place])     Past Surgical History:  Procedure Laterality Date  . ABDOMINAL HYSTERECTOMY    . BACK SURGERY    . CARDIAC CATHETERIZATION  2004   left internal mammary artery to the obtuse marginal  was found to be small and thread like.  The two grafts were patent.  The left circumflex had 90% in-stent restenosis and cutting balloon angioplasty was performed followed by placement of a 3.0 x 18mm Taxus drug -eluting stent.    [name] CARDIAC CATHETERIZATION  2006   There was in-stent restenosis in the left circumflex and this was treated with cutting balloon angioplasty   . CARDIAC CATHETERIZATION  2008   vein graft to the to the obtuse marginal was patent, although small, left circumflex had 40% in-stent restenosis, ejection fraction 40-45%.  The patient was medically mananged.  [name] CARDIAC CATHETERIZATION N/A 01/09/2015   Procedure: Left Heart Cath and Cors/Grafts Angiography;  Surgeon: Belva Crome, MD;  Location: [facility];  Service: Cardiovascular;  Laterality: N/A;  . COLONOSCOPY  03/29/2011   Procedure: COLONOSCOPY;  Surgeon: Jamesetta So, MD;  [facility];  Service: Gastroenterology;  Laterality: N/A;  . CORONARY ARTERY BYPASS GRAFT  May 31,2001   x 3 with a vein graft to the first diagonal, vein graft to the right coronary  artery, and a free left internal mammary  artery to the obtuse marginal   . ESOPHAGEAL BANDING N/A 07/04/2012   Procedure: ESOPHAGEAL BANDING;  Surgeon: Rogene Houston, MD;  [facility];  Service: Endoscopy;  Laterality: N/A;  . ESOPHAGEAL BANDING N/A 09/17/2012   Procedure: ESOPHAGEAL BANDING;  Surgeon: Rogene Houston, MD;  [facility];  Service: Endoscopy;  Laterality: N/A;  . ESOPHAGEAL BANDING N/A 10/22/2013   Procedure: ESOPHAGEAL  BANDING;  Surgeon: Rogene Houston, MD;  [facility];  Service: Endoscopy;  Laterality: N/A;  . ESOPHAGEAL BANDING N/A 11/28/2014   Procedure: ESOPHAGEAL BANDING;  Surgeon: Rogene Houston, MD;  [facility];  Service: Endoscopy;  Laterality: N/A;  . ESOPHAGEAL BANDING N/A 10/29/2015   Procedure: ESOPHAGEAL BANDING;  Surgeon: Rogene Houston, MD;  [facility];  Service: Endoscopy;  Laterality: N/A;  . ESOPHAGOGASTRODUODENOSCOPY  12/20/2011   Procedure: ESOPHAGOGASTRODUODENOSCOPY (EGD);  Surgeon: Jamesetta So, MD;  [facility];  Service: Gastroenterology;  Laterality: N/A;  . ESOPHAGOGASTRODUODENOSCOPY N/A 07/04/2012   Procedure: ESOPHAGOGASTRODUODENOSCOPY (EGD);  Surgeon: Rogene Houston, MD;  [facility];  Service: Endoscopy;  Laterality: N/A;  235-moved to 255 Ann to notify pt  . ESOPHAGOGASTRODUODENOSCOPY N/A 09/17/2012  Procedure: ESOPHAGOGASTRODUODENOSCOPY (EGD);  Surgeon: Rogene Houston, MD;  [facility];  Service: Endoscopy;  Laterality: N/A;  730  . ESOPHAGOGASTRODUODENOSCOPY N/A 10/22/2013   Procedure: ESOPHAGOGASTRODUODENOSCOPY (EGD);  Surgeon: Rogene Houston, MD;  [facility];  Service: Endoscopy;  Laterality: N/A;  730  . ESOPHAGOGASTRODUODENOSCOPY N/A 11/28/2014   Procedure: ESOPHAGOGASTRODUODENOSCOPY (EGD);  Surgeon: Rogene Houston, MD;  [facility];  Service: Endoscopy;  Laterality: N/A;  1:25  . ESOPHAGOGASTRODUODENOSCOPY N/A 10/29/2015   Procedure: ESOPHAGOGASTRODUODENOSCOPY (EGD);  Surgeon: Rogene Houston, MD;  [facility];  Service: Endoscopy;  Laterality: N/A;  12:00  . ESOPHAGOGASTRODUODENOSCOPY N/A 12/12/2016   Procedure: ESOPHAGOGASTRODUODENOSCOPY (EGD);  Surgeon: Rogene Houston, MD;  [facility];  Service: Endoscopy;  Laterality: N/A;  225  . FLEXIBLE SIGMOIDOSCOPY N/A 03/20/2017   Procedure: FLEXIBLE SIGMOIDOSCOPY;  Surgeon: Rogene Houston, MD;   [facility];  Service: Endoscopy;  Laterality: N/A;  7:30  . JOINT REPLACEMENT    . LEFT HEART CATH AND CORS/GRAFTS ANGIOGRAPHY N/A 08/15/2016   Procedure: Left Heart Cath and Cors/Grafts Angiography;  Surgeon: Jettie Booze, MD;  Location: [facility];  Service: Cardiovascular;  Laterality: N/A;  . LEFT HEART CATHETERIZATION WITH CORONARY/GRAFT ANGIOGRAM N/A 12/25/2013   Procedure: LEFT HEART CATHETERIZATION WITH Beatrix Fetters;  Surgeon: Blane Ohara, MD;  Location: [facility] C[facility] LAB;  Service: Cardiovascular;  Laterality: N/A;  . RIGHT HEART CATH N/A 05/29/2017   Procedure: RIGHT HEART CATH;  Surgeon: Jolaine Artist, MD;  Location: [facility];  Service: Cardiovascular;  Laterality: N/A;  . rotator cuff left  2007  . TONSILLECTOMY      Prior to Admission medications   Medication Sig Start Date End Date Taking? Authorizing Provider  albuterol (PROVENTIL HFA;VENTOLIN HFA) 108 (90 BASE) MCG/ACT inhaler Inhale 2 puffs into the lungs every 6 (six) hours as needed for wheezing or shortness of breath.    Yes [provider]  atorvastatin (LIPITOR) 20 MG tablet Take 20 mg by mouth every evening.    Yes [provider]  calcium carbonate (TUMS - DOSED IN MG ELEMENTAL CALCIUM) 500 MG chewable tablet Chew 1 tablet by mouth daily as needed for indigestion or heartburn.   Yes [provider]  ezetimibe (ZETIA) 10 MG tablet Take 10 mg by mouth at bedtime.    Yes [provider]  FLUoxetine (PROZAC) 40 MG capsule Take 40 mg by mouth daily.    Yes [provider]  HYDROcodone-acetaminophen (NORCO) 10-325 MG tablet Take 1 tablet by mouth as needed for pain. 11/06/17  Yes [provider]  isosorbide mononitrate (IMDUR) 60 MG 24 hr tablet Take 1.5 tablets (90 mg total) by mouth daily. 11/15/17 11/10/18 Yes Sherren Mocha, MD  metoprolol succinate (TOPROL XL) 25 MG 24 hr tablet Take 1 tablet (25 mg total) by mouth  daily. 03/14/18  Yes Weaver, Scott T, PA-C  NITROSTAT 0.4 MG SL tablet Place 0.4 mg under the tongue every 5 (five) minutes as needed for chest pain (x 3 tabs daily).  11/15/13  Yes [provider]  pantoprazole (PROTONIX) 40 MG tablet Take 40 mg by mouth daily.   Yes [provider]  polyethylene glycol (MIRALAX / GLYCOLAX) packet Take 17 g by mouth daily.   Yes [provider]  tobramycin-dexamethasone (TOBRADEX) ophthalmic solution Place 1 drop into both eyes 4 (four) times daily. 04/16/18  Yes [provider]  torsemide (Magazine)  20 MG tablet Take 2 tablets (40 mg total) by mouth daily. 11/15/17 11/10/18 Yes Sherren Mocha, MD  valsartan (DIOVAN) 160 MG tablet Take 1 tablet (160 mg total) by mouth at bedtime. 06/15/17 06/10/18 Yes TillerySatira Mccallum, PA-C    Scheduled Meds: . sodium chloride   Intravenous Once  . atorvastatin  20 mg Oral QPM  . ezetimibe  10 mg Oral QHS  . FLUoxetine  40 mg Oral Daily  . furosemide  80 mg Intravenous Q12H  . ipratropium-albuterol  3 mL Nebulization BID  . isosorbide mononitrate  90 mg Oral Daily  . metoprolol succinate  25 mg Oral Daily  . pantoprazole  40 mg Oral Daily  . potassium chloride  20 mEq Oral Daily  . sodium chloride flush  3 mL Intravenous Once  . sodium chloride flush  3 mL Intravenous Q12H  . tobramycin-dexamethasone  1 drop Both Eyes QID   Infusions: . sodium chloride     PRN Meds: sodium chloride, calcium carbonate, HYDROcodone-acetaminophen, nitroGLYCERIN, ondansetron **OR** ondansetron (ZOFRAN) IV, sodium chloride flush   Allergies as of 04/17/2018 - Review Complete 04/17/2018  Allergen Reaction Noted  . Entresto [sacubitril-valsartan] Swelling 05/25/2017  . Acetaminophen Other (See Comments) 01/08/2015  . Oxycodone Itching and Nausea Only 01/08/2015  . Ace inhibitors Other (See Comments) 07/04/2012  . Cefaclor Itching and Rash 06/23/2008  . Cephalexin Itching and Rash 06/23/2008  .  Penicillins Rash 06/23/2008  . Pregabalin Other (See Comments) 06/23/2011  . Tape Itching and Rash 06/19/2013    Family History  Problem Relation Age of Onset  . Heart attack Mother   . Heart attack Father 43       cause of death  . Coronary artery disease Sister        CABG   . Colon cancer Neg Hx     Social History   Socioeconomic History  . Marital status: Divorced    Spouse name: Not on file  . Number of children: Not on file  . Years of education: Not on file  . Highest education level: Not on file  Occupational History  . Occupation: Disabled  Social Needs  . Financial resource strain: Not on file  . Food insecurity:    Worry: Not on file    Inability: Not on file  . Transportation needs:    Medical: Not on file    Non-medical: Not on file  Tobacco Use  . Smoking status: Former Smoker    Packs/day: 0.50    Years: 20.00    Pack years: 10.00    Types: Cigarettes    Last attempt to quit: 07/21/1995    Years since quitting: 22.7  . Smokeless tobacco: Never Used  Substance and Sexual Activity  . Alcohol use: No  . Drug use: No  . Sexual activity: Not on file  Lifestyle  . Physical activity:    Days per week: Not on file    Minutes per session: Not on file  . Stress: Not on file  Relationships  . Social connections:    Talks on phone: Not on file    Gets together: Not on file    Attends religious service: Not on file    Active member of club or organization: Not on file    Attends meetings of clubs or organizations: Not on file    Relationship status: Not on file  . Intimate partner violence:    Fear of current or ex partner: Not on file  Emotionally abused: Not on file    Physically abused: Not on file    Forced sexual activity: Not on file  Other Topics Concern  . Not on file  Social History Narrative   Lives in [place] by herself.      REVIEW OF SYSTEMS: Constitutional: Feels well.  No weakness or fatigue. ENT:  No nose bleeds Pulm: Shortness  of breath resolved.  No cough. CV:  No palpitations.   Lower extremity edema resolved.  No chest pain. GU:  No hematuria, no frequency GI: Per HPI. Heme: Denies unusual or excessive bleeding or bruising. Transfusions: See HPI. Neuro:  No headaches, no peripheral tingling or numbness.  Syncope, no dizziness. Derm:  No itching, no rash or sores.  Endocrine:  No sweats or chills.  No polyuria or dysuria Immunization: I did not ask her about recent vaccinations.   PHYSICAL EXAM: Vital signs in last 24 hours: Vitals:   04/20/18 0656 04/20/18 0758  BP: (!) 118/56   Pulse: 71   Resp: 16   Temp: 98.5 F (36.9 C)   SpO2: 95% 95%   Wt Readings from Last 3 Encounters:  04/20/18 91.9 kg  04/14/18 89 kg  03/14/18 92.1 kg    General: Pleasant, obese, comfortable, non-ill-appearing Head: No facial asymmetry or swelling.  No signs of head trauma. Eyes: No conjunctival pallor.  No scleral icterus. Ears: Not hard of hearing. Nose: No congestion or discharge. Mouth: Oral mucosa pink, moist, clear.  Good dentition.  Tongue midline. Neck: No JVD, no masses, no thyromegaly. Lungs: Clear bilaterally.  No cough, no labored breathing. Heart: RRR.  No MRG.  S1-S2 present.  Rate in the 60s. Abdomen: Soft.  Obese.  Nontender, nondistended.  Fullness and slight tenderness in the right upper quadrant but no obvious mass.  No hernias, no bruits.  Bowel sounds active..   Rectal: Visually inspected the rectum but it was quite tender when I attempted DRE so I aborted attempt with minimal insertion of digit, no blood on glove..  External hemorrhoids, nonthrombosed.  No visible blood or rectal trauma. Musc/Skeltl: No joint redness, swelling.  Long scar on the lateral right hip consistent with hip replacement surgery. Extremities: Long scar on the medial right calf consistent with vein harvesting.  No swelling.  Feet warm. Neurologic: Pleasant, alert and oriented x3.  Excellent historian.  No limb weakness or  tremor. Skin: No telangiectasia, rashes or suspicious lesions. Tattoos: None observed Nodes: No cervical adenopathy. Psych: Cooperative, pleasant, calm.  Speech fluent.  Intake/Output from previous day: 02/20 0701 - 02/21 0700 In: 240 [P.O.:240] Out: 2170 [Urine:2170] Intake/Output this shift: Total I/O In: 240 [P.O.:240] Out: -   LAB RESULTS: Recent Labs    04/18/18 0546 04/19/18 0700 04/20/18 0403  WBC 2.8* 2.2* 2.7*  HGB 9.5* 8.8* 8.6*  HCT 32.3* 30.2* 30.7*  PLT 65* 59* 63*   BMET Lab Results  Component Value Date   NA 139 04/20/2018   NA 140 04/19/2018   NA 141 04/19/2018   K 3.9 04/20/2018   K 3.0 (L) 04/19/2018   K 3.5 04/19/2018   CL 104 04/20/2018   CL 103 04/19/2018   CL 105 04/19/2018   CO2 27 04/20/2018   CO2 26 04/19/2018   CO2 24 04/19/2018   GLUCOSE 91 04/20/2018   GLUCOSE 151 (H) 04/19/2018   GLUCOSE 90 04/19/2018   BUN 7 (L) 04/20/2018   BUN 8 04/19/2018   BUN 9 04/19/2018   CREATININE 0.80 04/20/2018  CREATININE 0.85 04/19/2018   CREATININE 0.92 04/19/2018   CALCIUM 8.7 (L) 04/20/2018   CALCIUM 8.4 (L) 04/19/2018   CALCIUM 8.6 (L) 04/19/2018   LFT Recent Labs    04/18/18 0546 04/19/18 0700  PROT 6.8 6.7  ALBUMIN 3.2* 3.2*  AST 34 32  ALT 12 12  ALKPHOS 65 63  BILITOT 1.1 1.3*   PT/INR Lab Results  Component Value Date   INR 1.40 04/19/2018   INR 1.39 04/12/2018   INR 1.17 05/29/2017     RADIOLOGY STUDIES: No results found.   IMPRESSION:   *   Single episode of hematochezia with rectal burning ?  Hemorrhoidal source versus rectal fissure/trauma  *    Microcytic anemia with iron studies confirming IDA.  No iron supplementation currently or PTA.  *    Chronic constipation, normally well controlled with daily MiraLAX which has not been prescribed during this admission.  *    NASH cirrhosis.  Well compensated.  *     History of nonbleeding esophageal and gastric varices, status post previous variceal band  ligation, last done 09/2015.  On EGD of 11/2016 the varices were small and no repeat ligation performed.  No signs of [place] on ultrasound 10/2017.  *    Pancytopenia.   PLAN:     *   Feraheme infusion now and in 1 week.    *    Add Anusol HC suppositories for the next few days.  Restart her daily MiraLAX.  *    Okay to discharge home after her Feraheme infusion.  *    Keep her follow-up appointment 05/01/2018 with Dr. Laural Golden.     Azucena Freed  04/20/2018, 12:49 PM Phone [phone]    Attending Physician Note   I have taken a history, examined the patient and reviewed the chart. I agree with the Advanced Practitioner's note, impression and recommendations.  Iron deficiency anemia and pancytopenia Small volume single episode rectal bleeding c/w benign anorectal source such as hemorrhoids NASH cirrhosis with gastric varices, esophageal varices post banding Chronic constipation  Replace Fe IV Anusol HC supp bid for 5 days then prn Resume Miralax No additional inpatient GI evaluation Follow up with her gastroenterologist, Dr. Laural Golden, as outpatient  Lucio Edward, MD [facility] [phone]

## 2018-04-20 NOTE — Progress Notes (Signed)
Pt experienced blood in stool when using the commode. MD notified. Will continue to monitor.

## 2018-04-21 LAB — TYPE AND SCREEN
ABO/RH(D): O POS
Antibody Screen: NEGATIVE
Unit division: 0
Unit division: 0

## 2018-04-21 LAB — BPAM RBC
Blood Product Expiration Date: 202003172359
Blood Product Expiration Date: 202003172359
ISSUE DATE / TIME: 202002181715
Unit Type and Rh: 5100
Unit Type and Rh: 5100

## 2018-04-24 ENCOUNTER — Ambulatory Visit: Payer: Medicare Other | Admitting: Urology

## 2018-04-26 ENCOUNTER — Ambulatory Visit: Payer: Medicare Other | Admitting: Physician Assistant

## 2018-05-01 ENCOUNTER — Encounter (INDEPENDENT_AMBULATORY_CARE_PROVIDER_SITE_OTHER): Payer: Self-pay | Admitting: *Deleted

## 2018-05-01 ENCOUNTER — Encounter (INDEPENDENT_AMBULATORY_CARE_PROVIDER_SITE_OTHER): Payer: Self-pay | Admitting: Internal Medicine

## 2018-05-01 ENCOUNTER — Ambulatory Visit (INDEPENDENT_AMBULATORY_CARE_PROVIDER_SITE_OTHER): Payer: Medicare Other | Admitting: Internal Medicine

## 2018-05-01 VITALS — BP 128/74 | HR 66 | Temp 98.2°F | Resp 18 | Ht 63.0 in | Wt 196.3 lb

## 2018-05-01 DIAGNOSIS — K219 Gastro-esophageal reflux disease without esophagitis: Secondary | ICD-10-CM

## 2018-05-01 DIAGNOSIS — K7460 Unspecified cirrhosis of liver: Secondary | ICD-10-CM | POA: Diagnosis not present

## 2018-05-01 DIAGNOSIS — D500 Iron deficiency anemia secondary to blood loss (chronic): Secondary | ICD-10-CM

## 2018-05-01 NOTE — Patient Instructions (Signed)
Hemoccult x1. Patient will call with results of blood test and ultrasound when completed. Please report to emergency room if you experience rectal bleeding or tarry stools.

## 2018-05-01 NOTE — Progress Notes (Signed)
Presenting complaint;  Follow-up for chronic liver disease and anemia.  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 Patient was admitted to [facility] twice last month for CHF and rapid A. fib.  Her hemoglobin was noted to be low.  She received a unit of PRBCs.  She states she also received 2 iron infusions.  On her second admission she had rectal bleeding.  She passed bright red blood per rectum which she describes to be at least moderate.  She was seen by Dr. Lucio Edward.  He felt she may have hemorrhoidal bleeding.  She did not have any further episodes.  Patient reports no further episodes of rectal bleeding.  She also denies melena.  She has intermittent nausea but no vomiting.  She has lost 7 pounds since her last visit.  She states she had gained 9 pounds in a matter of 3 days prior to her first hospitalization last month..  She states she has lost couple of pounds since she was discharged.  She denies abdominal pain.  She does not take aspirin or OTC NSAIDs.  She is on pain medication for chronic back pain.  She states she takes it more for cough then for back pain.   Current Medications: Outpatient Encounter Medications as of 05/01/2018  Medication Sig  . albuterol (PROVENTIL HFA;VENTOLIN HFA) 108 (90 BASE) MCG/ACT inhaler Inhale 2 puffs into the lungs every 6 (six) hours as needed for wheezing or shortness of breath.   [name] atorvastatin (LIPITOR) 20 MG tablet Take 20 mg by mouth every evening.   . calcium carbonate (TUMS - DOSED IN MG ELEMENTAL CALCIUM) 500 MG chewable tablet Chew 1 tablet by mouth daily as needed for indigestion or heartburn.  . ezetimibe (ZETIA) 10 MG tablet Take 10 mg by mouth at bedtime.   [name] FLUoxetine (PROZAC) 20 MG capsule Take 20  mg by mouth daily.   [name] HYDROcodone-acetaminophen (NORCO) 10-325 MG tablet Take 1 tablet by mouth as needed for pain.  . isosorbide mononitrate (IMDUR) 60 MG 24 hr tablet Take 1.5 tablets (90 mg total) by mouth daily.  . metoprolol succinate (TOPROL XL) 25 MG 24 hr tablet Take 1 tablet (25 mg total) by mouth daily.  [name] NITROSTAT 0.4 MG SL tablet Place 0.4 mg under the tongue every 5 (five) minutes as needed for chest pain (x 3 tabs daily).   . pantoprazole (PROTONIX) 40 MG tablet Take 40 mg by mouth daily.  . polyethylene glycol (MIRALAX / GLYCOLAX) packet Take 17 g by mouth daily.  . potassium chloride 20 MEQ TBCR Take 20 mEq by mouth daily.  [name] torsemide (DEMADEX) 20 MG tablet Take 2 tablets (40 mg total) by mouth daily.  . valsartan (DIOVAN) 160 MG tablet Take 1 tablet (160 mg total) by mouth at bedtime.  . [DISCONTINUED] tobramycin-dexamethasone (TOBRADEX) ophthalmic solution Place 1 drop into both eyes 4 (four) times daily.   No facility-administered encounter medications on file as of 05/01/2018.      Objective: Blood pressure 128/74, pulse 66, temperature 98.2 F (36.8 C), temperature source Oral, resp. rate 18, height _0  (1.6 m), weight 196 lb 4.8 oz (89 kg), last menstrual period 03/29/2011. Patient is alert and does not have asterixis. Conjunctiva is pink. Sclera is nonicteric Oropharyngeal mucosa is normal. No neck  masses or thyromegaly noted. Cardiac exam with iregular rhythm normal S1 and S2. No murmur or gallop noted. Lungs are clear to auscultation. Abdomen is full.  On palpation abdomen is soft.  Spleen is easily palpable.  Liver edge is indistinct.  Shifting dullness is absent. No LE edema or clubbing noted.  Labs/studies Results:   CBC Latest Ref Rng & Units 04/20/2018 04/19/2018 04/18/2018  WBC 4.0 - 10.5 K/uL 2.7(L) 2.2(L) 2.8(L)  Hemoglobin 12.0 - 15.0 g/dL 8.6(L) 8.8(L) 9.5(L)  Hematocrit 36.0 - 46.0 % 30.7(L) 30.2(L) 32.3(L)  Platelets 150 - 400 K/uL 63(L) 59(L)  65(L)    CMP Latest Ref Rng & Units 04/20/2018 04/19/2018 04/19/2018  Glucose 70 - 99 mg/dL 91 151(H) 90  BUN 8 - 23 mg/dL 7(L) 8 9  Creatinine 0.44 - 1.00 mg/dL 0.80 0.85 0.92  Sodium 135 - 145 mmol/L 139 140 141  Potassium 3.5 - 5.1 mmol/L 3.9 3.0(L) 3.5  Chloride 98 - 111 mmol/L 104 103 105  CO2 22 - 32 mmol/L _0 Calcium 8.9 - 10.3 mg/dL 8.7(L) 8.4(L) 8.6(L)  Total Protein 6.5 - 8.1 g/dL - - 6.7  Total Bilirubin 0.3 - 1.2 mg/dL - - 1.3(H)  Alkaline Phos 38 - 126 U/L - - 63  AST 15 - 41 U/L - - 32  ALT 0 - 44 U/L - - 12    Hepatic Function Latest Ref Rng & Units 04/19/2018 04/18/2018 04/12/2018  Total Protein 6.5 - 8.1 g/dL 6.7 6.8 6.2(L)  Albumin 3.5 - 5.0 g/dL 3.2(L) 3.2(L) 3.0(L)  AST 15 - 41 U/L 32 34 29  ALT 0 - 44 U/L _1 Alk Phosphatase 38 - 126 U/L 63 65 54  Total Bilirubin 0.3 - 1.2 mg/dL 1.3(H) 1.1 1.3(H)  Bilirubin, Direct 0.0 - 0.2 mg/dL - - 0.3(H)    Last ultrasound was on 11/01/2017 revealing cirrhotic liver without focal abnormalities and splenomegaly with normal portal venous flow.  Assessment:  #1.  Cirrhosis secondary to NASH complicated by esophageal and gastric varices as well as splenomegaly and thrombocytopenia.  Last EGD was in October 2018 revealing grade 1 esophageal varices but prominent fundal varices.  As best as can be determined she has never bled from gastric varices.  If this was the case she will be a candidate for BRTO.  Patient is beta-blocker which is nonselective.  She will stay on metoprolol as recommended by cardiology.  She is due for [facility] screening.  #2.  Iron deficiency anemia.  She has not had gross GI bleed other than an episode in February 2020.[name]  It remains to be seen if she has chronic occult GI bleed or simply impaired iron absorption because she is on a PPI therapy for chronic GERD.   Plan:  Hemoccult x1. Patient will go to the lab for CBC and AFP. Abdominal ultrasound for [place] screening. Office visit in 3  months.

## 2018-05-02 ENCOUNTER — Encounter: Payer: Self-pay | Admitting: Physician Assistant

## 2018-05-02 ENCOUNTER — Ambulatory Visit (INDEPENDENT_AMBULATORY_CARE_PROVIDER_SITE_OTHER): Payer: Medicare Other | Admitting: Physician Assistant

## 2018-05-02 VITALS — BP 130/60 | HR 67 | Ht 63.0 in | Wt 197.0 lb

## 2018-05-02 DIAGNOSIS — K7460 Unspecified cirrhosis of liver: Secondary | ICD-10-CM | POA: Diagnosis not present

## 2018-05-02 DIAGNOSIS — I4819 Other persistent atrial fibrillation: Secondary | ICD-10-CM | POA: Diagnosis not present

## 2018-05-02 DIAGNOSIS — I5042 Chronic combined systolic (congestive) and diastolic (congestive) heart failure: Secondary | ICD-10-CM

## 2018-05-02 DIAGNOSIS — I10 Essential (primary) hypertension: Secondary | ICD-10-CM

## 2018-05-02 DIAGNOSIS — I2510 Atherosclerotic heart disease of native coronary artery without angina pectoris: Secondary | ICD-10-CM | POA: Diagnosis not present

## 2018-05-02 DIAGNOSIS — D500 Iron deficiency anemia secondary to blood loss (chronic): Secondary | ICD-10-CM | POA: Diagnosis not present

## 2018-05-02 NOTE — Progress Notes (Signed)
Cardiology Office Note:    Date:  05/02/2018   ID:  Samantha [name], DOB April 06, 1950, MRN [id number]  PCP:  [name], MD  Cardiologist:  Sherren Mocha, MD   Electrophysiologist:  None  Advanced Heart Failure Clinic:  Glori Bickers, MD (released in 07/2017) GI:  Dr. Laural Golden  Referring MD: [name], MD   Chief Complaint  Patient presents with  . Hospitalization Follow-up    CHF     History of Present Illness:    Samantha [name] is a 68 y.o. female with CAD status post CABG in 2001 and multiple PCI procedures, chronic angina, nonalcoholic cirrhosis with esophageal varices (clopidogrel stopped in the past secondary to varices requiring banding), chronic systolic heart failure, diabetes, atrial flutter, persistent atrial fibrillation, hypertension, hyperlipidemia. She is not felt to be a candidate for anticoagulation secondary to cirrhosis and history of esophageal varices (Dr. Burt Knack has discussed her case with GI). Ejection fraction has been as low as 25-35%. She is not felt to be a candidate for advanced therapies for heart failure. She was admitted in 04/2017 with decompensated heart failure and was followed by Dr. Haroldine Laws in the [facility] until 6/19.  Echo in 9/19 demonstrated that her EF had declined again and was 40-45.  She was last seen in clinic by me in 02/2018.    She was admitted x 2 in 03/2018 with decompensated HF and worsening anemia.  She was given a transfusion with PRBCs and IV Iron with somewhat improved Hgb.     Samantha [name] returns for post hospitalization follow up. She is here alone. She remains tired.  Her weights at home have remained stable.  Her breathing remains improved.  She denies paroxysmal nocturnal dyspnea, orthopnea, syncope, leg swelling.  Prior CV studies:   The following studies were reviewed today:  Echo 11/15/2017 EF 40-45, inferior and lateral hypokinesis, grade 2 diastolic dysfunction, mild LAE, trivial TR, PASP 37  Right heart  catheterization 05/29/2017 Findings: RA = 11 RV = 51/12 PA = 51/13 (32) PCW = 18 (v = 25-30) Fick cardiac output/index = 9.7/5.0 Thermo CO/CI = 7.6/3.9 PVR = 1.8 WU FA sat = 96% PA sat = 73%, 76% SVC sat = 67% Assessment: 1. Mild to moderately elevated R-sided pressures 2. Minimally elevated wedge pressure with prominent v-waves likely due to diastolic dysfunction 3. High-cardiac output likely due to cirrhotic physiology  4. No evidence of significant intra-cardiac shunting  Echo 05/25/2017 Mild LVH, EF 50-55, MAC, mild MR, moderate LAE, mildly decreased RVSF, moderate RAE, moderate TR, PASP 54  Echo 01/25/17 Mild LVH, EF 30-35, inferior/inferolateral and anterolateral HK, MAC, mild LAE, moderately reduced RVSF, mild RAE, PASP 27  Cardiac catheterization 08/15/16 LAD D1 ostial 65 LCx ostial stent patent with 40 ISR RCA proximal 95 LIMA-OM 2 occluded SVG-AM. patent SVG-D1 patent   Past Medical History:  Diagnosis Date  . Chronic combined systolic and diastolic CHF (congestive heart failure) ([place])   . Cirrhosis of liver ([place])   . CKD (chronic kidney disease), stage III ([place])   . Coronary artery disease    a. s/p CABG 2001 w/ (LIMA-OM, SVG-D1, SVG-RCA). b. h/o multiple PCIs per Dr. Antionette Char note.  . Depression   . Esophageal varices ([place])    New 2013  . Gastroesophageal reflux disease   . History of pneumonia   . Hyperlipidemia   . Hypertension   . Obesity   . Osteoarthritis   . Pancytopenia ([place])   . Paroxysmal atrial  flutter ([place])    a. dx 05/2016.  [name] Persistent atrial fibrillation    a. reported in hosp 07/2016, not on anticoag due to cirrhosis and liver disease, low platelets, varices.  . Thrombocytopenia ([place])    Surgical Hx: The patient  has a past surgical history that includes Coronary artery bypass graft (May 31,2001); Cardiac catheterization (2004); Cardiac catheterization (2006); Cardiac catheterization (2008); Tonsillectomy; Joint replacement; rotator  cuff left (2007); Back surgery; Abdominal hysterectomy; Colonoscopy (03/29/2011); Esophagogastroduodenoscopy (12/20/2011); Esophagogastroduodenoscopy (N/A, 07/04/2012); esophageal banding (N/A, 07/04/2012); Esophagogastroduodenoscopy (N/A, 09/17/2012); esophageal banding (N/A, 09/17/2012); Esophagogastroduodenoscopy (N/A, 10/22/2013); esophageal banding (N/A, 10/22/2013); left heart catheterization with coronary/graft angiogram (N/A, 12/25/2013); Esophagogastroduodenoscopy (N/A, 11/28/2014); esophageal banding (N/A, 11/28/2014); Cardiac catheterization (N/A, 01/09/2015); Esophagogastroduodenoscopy (N/A, 10/29/2015); esophageal banding (N/A, 10/29/2015); LEFT HEART CATH AND CORS/GRAFTS ANGIOGRAPHY (N/A, 08/15/2016); Esophagogastroduodenoscopy (N/A, 12/12/2016); Flexible sigmoidoscopy (N/A, 03/20/2017); and RIGHT HEART CATH (N/A, 05/29/2017).   Current Medications: Current Meds  Medication Sig  . albuterol (PROVENTIL HFA;VENTOLIN HFA) 108 (90 BASE) MCG/ACT inhaler Inhale 2 puffs into the lungs every 6 (six) hours as needed for wheezing or shortness of breath.   [name] atorvastatin (LIPITOR) 20 MG tablet Take 20 mg by mouth every evening.   . calcium carbonate (TUMS - DOSED IN MG ELEMENTAL CALCIUM) 500 MG chewable tablet Chew 1 tablet by mouth daily as needed for indigestion or heartburn.  . ezetimibe (ZETIA) 10 MG tablet Take 10 mg by mouth at bedtime.   [name] FLUoxetine (PROZAC) 20 MG capsule Take 20 mg by mouth daily.   [name] FLUoxetine (PROZAC) 40 MG capsule Take 20 mg by mouth daily.  [name] HYDROcodone-acetaminophen (NORCO) 10-325 MG tablet Take 1 tablet by mouth as needed for pain.  . isosorbide mononitrate (IMDUR) 60 MG 24 hr tablet Take 1.5 tablets (90 mg total) by mouth daily.  . metoprolol succinate (TOPROL XL) 25 MG 24 hr tablet Take 1 tablet (25 mg total) by mouth daily.  . multivitamin (VIT W/EXTRA C) CHEW chewable tablet Chew 1 tablet by mouth.  [name] NITROSTAT 0.4 MG SL tablet Place 0.4 mg under the tongue every 5 (five)  minutes as needed for chest pain (x 3 tabs daily).   . pantoprazole (PROTONIX) 40 MG tablet Take 40 mg by mouth daily.  . polyethylene glycol (MIRALAX / GLYCOLAX) packet Take 17 g by mouth daily.  . potassium chloride 20 MEQ TBCR Take 20 mEq by mouth daily.  [name] torsemide (DEMADEX) 20 MG tablet Take 2 tablets (40 mg total) by mouth daily.  . valsartan (DIOVAN) 160 MG tablet Take 1 tablet (160 mg total) by mouth at bedtime.     Allergies:   Entresto [sacubitril-valsartan]; Acetaminophen; Oxycodone; Ace inhibitors; Cefaclor; Cephalexin; Penicillins; Pregabalin; and Tape   Social History   Tobacco Use  . Smoking status: Former Smoker    Packs/day: 0.50    Years: 20.00    Pack years: 10.00    Types: Cigarettes    Last attempt to quit: 07/21/1995    Years since quitting: 22.7  . Smokeless tobacco: Never Used  Substance Use Topics  . Alcohol use: No  . Drug use: No     Family Hx: The patient's family history includes Coronary artery disease in her sister; Heart attack in her mother; Heart attack (age of onset: 24) in her father. There is no history of Colon cancer.  ROS:   Please see the history of present illness.    Review of Systems  Constitution: Positive for malaise/fatigue.  Cardiovascular: Positive for irregular heartbeat.  Respiratory: Positive for cough.   Gastrointestinal: Positive for hematochezia.  Neurological: Positive for headaches.  Psychiatric/Behavioral: Positive for depression.   All other systems reviewed and are negative.   EKGs/Labs/Other Test Reviewed:    EKG:  EKG is   ordered today.  The ekg ordered today demonstrates normal sinus rhythm, HR 67, IVCD, normal axis, QTc 502, no change from prior tracing   Recent Labs: 04/12/2018: TSH 1.594 04/17/2018: B Natriuretic Peptide 140.2 04/19/2018: ALT 12 04/20/2018: BUN 7; Creatinine, Ser 0.80; Hemoglobin 8.6; Magnesium 1.9; Platelets 63; Potassium 3.9; Sodium 139   Recent Lipid Panel Lab Results  Component  Value Date/Time   CHOL 111 05/26/2017 06:37 AM   TRIG 93 05/26/2017 06:37 AM   HDL 38 (L) 05/26/2017 06:37 AM   CHOLHDL 2.9 05/26/2017 06:37 AM   LDLCALC 54 05/26/2017 06:37 AM    Physical Exam:    VS:  BP 130/60   Pulse 67   Ht _0  (1.6 m)   Wt 197 lb (89.4 kg)   LMP 03/29/2011   BMI 34.90 kg/m     Wt Readings from Last 3 Encounters:  05/02/18 197 lb (89.4 kg)  05/01/18 196 lb 4.8 oz (89 kg)  04/20/18 202 lb 9.6 oz (91.9 kg)     Physical Exam  Constitutional: She is oriented to person, place, and time. She appears well-developed and well-nourished. No distress.  HENT:  Head: Normocephalic and atraumatic.  Eyes: No scleral icterus.  Neck: No JVD present. No thyromegaly present.  Cardiovascular: Normal rate and regular rhythm.  Murmur heard.  Systolic murmur is present with a grade of 2/6 at the upper left sternal border and lower left sternal border. Pulmonary/Chest: Effort normal. She has no rales.  Abdominal: Soft.  Musculoskeletal:        General: Edema (trace bilat LE edema) present.  Lymphadenopathy:    She has no cervical adenopathy.  Neurological: She is alert and oriented to person, place, and time.  Skin: Skin is warm and dry.  Psychiatric: She has a normal mood and affect.    ASSESSMENT & PLAN:    Chronic combined systolic and diastolic CHF (congestive heart failure) ([place]) EF 40-45 by Echo in 9/19.  NYHA 2-3a.  Her weight has been stable since hospital DC.  She does a great job at weighing herself daily.  We discussed how she can take an extra Torsemide for weight gain > 2 lbs in 1 day.  She is mainly fatigued from anemia.  Her volume status is stable today.    -Continue beta-blocker, angiotensin receptor blocker    Coronary artery disease involving native coronary artery of native heart without angina pectoris S/p CABG and multiple PCI procedures. 2/3 grafts were patent in 2018.  She is not a candidate for invasive cardiac evaluation due to her bleeding  risk. She is managed medically.  She is not having any angina.  Continue statin, nitrates, beta-blocker.  Persistent atrial fibrillation Currently maintaining normal sinus rhythm.  She is not a candidate for anticoagulation.  Essential hypertension The patient's blood pressure is controlled on her current regimen.  Continue current therapy.    Cirrhosis, non-alcoholic ([place]) Continue follow up with GI.   Dispo:  Return in about 3 months (around 08/02/2018) for Routine Follow Up, w/ Dr. Burt Knack.   Medication Adjustments/Labs and Tests Ordered: Current medicines are reviewed at length with the patient today.  Concerns regarding medicines are outlined above.  Tests Ordered: Orders Placed This Encounter  Procedures  .  EKG 12-Lead   Medication Changes: No orders of the defined types were placed in this encounter.   Signed, Richardson Dopp, PA-C  05/02/2018 5:40 PM    [facility] [address], [place], [place]  [id number] Phone: [phone]; Fax: [phone]

## 2018-05-02 NOTE — Patient Instructions (Addendum)
Medication Instructions:   Your physician recommends that you continue on your current medications as directed. Please refer to the Current Medication list given to you today.  If you need a refill on your cardiac medications before your next appointment, please call your pharmacy.   Lab work: NONE ORDERED  TODAY    If you have labs (blood work) drawn today and your tests are completely normal, you will receive your results only by: [name] MyChart Message (if you have MyChart) OR . A paper copy in the mail If you have any lab test that is abnormal or we need to change your treatment, we will call you to review the results.  Testing/Procedures: NONE ORDERED  TODAY     Follow-Up:  YOU WILL BE CONTACTED BACK BY DR COOPER NURSE KATY KEMP   Any Other Special Instructions Will Be Listed Below (If Applicable).

## 2018-05-03 LAB — CBC
HCT: 39.2 % (ref 35.0–45.0)
Hemoglobin: 11.6 g/dL — ABNORMAL LOW (ref 11.7–15.5)
MCH: 24.1 pg — ABNORMAL LOW (ref 27.0–33.0)
MCHC: 29.6 g/dL — ABNORMAL LOW (ref 32.0–36.0)
MCV: 81.5 fL (ref 80.0–100.0)
MPV: 11.8 fL (ref 7.5–12.5)
Platelets: 58 10*3/uL — ABNORMAL LOW (ref 140–400)
RBC: 4.81 10*6/uL (ref 3.80–5.10)
RDW: 19.9 % — ABNORMAL HIGH (ref 11.0–15.0)
WBC: 3.9 10*3/uL (ref 3.8–10.8)

## 2018-05-03 LAB — AFP TUMOR MARKER: AFP-Tumor Marker: 2.9 ng/mL

## 2018-05-04 ENCOUNTER — Telehealth (INDEPENDENT_AMBULATORY_CARE_PROVIDER_SITE_OTHER): Payer: Self-pay | Admitting: *Deleted

## 2018-05-04 NOTE — Telephone Encounter (Signed)
   Diagnosis:    Result(s)   Card 1 : Negative:           Completed by:  Thomas Hoff, LPN   HEMOCCULT SENSA DEVELOPER: LOT#:  [id number] EXPIRATION DATE: 2021-11   HEMOCCULT SENSA CARD:  LOT#:  [id number] EXPIRATION DATE: 05/21   CARD CONTROL RESULTS:  POSITIVE: Positive NEGATIVE: Negative    ADDITIONAL COMMENTS: Patient has been called and given her result. Forwarded to [place] for review.

## 2018-05-06 NOTE — Telephone Encounter (Signed)
Stool is guaiac negative which is wonderful

## 2018-05-07 ENCOUNTER — Ambulatory Visit (HOSPITAL_COMMUNITY)
Admission: RE | Admit: 2018-05-07 | Discharge: 2018-05-07 | Disposition: A | Discharge status: Home / Self Care | Payer: Medicare Other | Source: Ambulatory Visit | Attending: Internal Medicine | Admitting: Internal Medicine

## 2018-05-07 DIAGNOSIS — K7460 Unspecified cirrhosis of liver: Secondary | ICD-10-CM | POA: Diagnosis not present

## 2018-05-14 DIAGNOSIS — S32010A Wedge compression fracture of first lumbar vertebra, initial encounter for closed fracture: Secondary | ICD-10-CM | POA: Diagnosis not present

## 2018-05-18 DIAGNOSIS — H40033 Anatomical narrow angle, bilateral: Secondary | ICD-10-CM | POA: Diagnosis not present

## 2018-05-18 DIAGNOSIS — H04123 Dry eye syndrome of bilateral lacrimal glands: Secondary | ICD-10-CM | POA: Diagnosis not present

## 2018-05-20 DIAGNOSIS — H10013 Acute follicular conjunctivitis, bilateral: Secondary | ICD-10-CM | POA: Diagnosis not present

## 2018-05-22 ENCOUNTER — Ambulatory Visit: Payer: Medicare Other | Admitting: Urology

## 2018-06-13 ENCOUNTER — Ambulatory Visit: Payer: Medicare Other | Admitting: Physician Assistant

## 2018-07-19 DIAGNOSIS — E782 Mixed hyperlipidemia: Secondary | ICD-10-CM | POA: Diagnosis not present

## 2018-07-19 DIAGNOSIS — K210 Gastro-esophageal reflux disease with esophagitis: Secondary | ICD-10-CM | POA: Diagnosis not present

## 2018-07-19 DIAGNOSIS — I10 Essential (primary) hypertension: Secondary | ICD-10-CM | POA: Diagnosis not present

## 2018-07-19 DIAGNOSIS — E7800 Pure hypercholesterolemia, unspecified: Secondary | ICD-10-CM | POA: Diagnosis not present

## 2018-07-19 DIAGNOSIS — E1140 Type 2 diabetes mellitus with diabetic neuropathy, unspecified: Secondary | ICD-10-CM | POA: Diagnosis not present

## 2018-07-25 ENCOUNTER — Telehealth: Payer: Self-pay

## 2018-07-25 DIAGNOSIS — D649 Anemia, unspecified: Secondary | ICD-10-CM | POA: Diagnosis not present

## 2018-07-25 DIAGNOSIS — E119 Type 2 diabetes mellitus without complications: Secondary | ICD-10-CM | POA: Diagnosis not present

## 2018-07-25 DIAGNOSIS — I10 Essential (primary) hypertension: Secondary | ICD-10-CM | POA: Diagnosis not present

## 2018-07-25 DIAGNOSIS — D72819 Decreased white blood cell count, unspecified: Secondary | ICD-10-CM | POA: Diagnosis not present

## 2018-07-25 DIAGNOSIS — D696 Thrombocytopenia, unspecified: Secondary | ICD-10-CM | POA: Diagnosis not present

## 2018-07-26 ENCOUNTER — Telehealth: Payer: Self-pay

## 2018-07-26 ENCOUNTER — Telehealth: Payer: Self-pay | Admitting: Cardiovascular Disease

## 2018-07-26 NOTE — Telephone Encounter (Signed)
F/U Message           Patient is returning April Garrison's call would like a call back on her home phone [phone]

## 2018-07-26 NOTE — Telephone Encounter (Signed)
F/U Message           Patient is retuning April Garrison's call and would like a call back @ [phone]

## 2018-07-26 NOTE — Telephone Encounter (Signed)
YOUR CARDIOLOGY TEAM HAS ARRANGED FOR AN E-VISIT FOR YOUR APPOINTMENT - PLEASE REVIEW IMPORTANT INFORMATION BELOW SEVERAL DAYS PRIOR TO YOUR APPOINTMENT  Due to the recent COVID-19 pandemic, we are transitioning in-person office visits to tele-medicine visits in an effort to decrease unnecessary exposure to our patients, their families, and staff. These visits are billed to your insurance just like a normal visit is. We also encourage you to sign up for MyChart if you have not already done so. You will need a smartphone if possible. For patients that do not have this, we can still complete the visit using a regular telephone but do prefer a smartphone to enable video when possible. You may have a family member that lives with you that can help. If possible, we also ask that you have a blood pressure cuff and scale at home to measure your blood pressure, heart rate and weight prior to your scheduled appointment. Patients with clinical needs that need an in-person evaluation and testing will still be able to come to the office if absolutely necessary. If you have any questions, feel free to call our office.     YOUR PROVIDER WILL BE USING THE FOLLOWING PLATFORM TO COMPLETE YOUR VISIT: Phone Call  . IF USING MYCHART - How to Download the MyChart App to Your SmartPhone   - If Apple, go to [employer] and type in MyChart in the search bar and download the app. If Android, ask patient to go to [employer] and type in [place] in the search bar and download the app. The app is free but as with any other app downloads, your phone may require you to verify saved payment information or Apple/Android password.  - You will need to then log into the app with your MyChart username and password, and select [facility] as your healthcare provider to link the account.  - When it is time for your visit, go to the MyChart app, find appointments, and click Begin Video Visit. Be sure to Select Allow for your device to  access the Microphone and Camera for your visit. You will then be connected, and your provider will be with you shortly.  **If you have any issues connecting or need assistance, please contact MyChart service desk [phone])**  **If using a computer, in order to ensure the best quality for your visit, you will need to use either of the following Internet Browsers: Insurance underwriter or [employer]**  . IF USING DOXIMITY or DOXY.ME - The staff will give you instructions on receiving your link to join the meeting the day of your visit.      2-3 DAYS BEFORE YOUR APPOINTMENT  You will receive a telephone call from one of our [place] team members - your caller ID may say "Unknown caller." If this is a video visit, we will walk you through how to get the video launched on your phone. We will remind you check your blood pressure, heart rate and weight prior to your scheduled appointment. If you have an Apple Watch or Kardia, please upload any pertinent ECG strips the day before or morning of your appointment to [place]. Our staff will also make sure you have reviewed the consent and agree to move forward with your scheduled tele-health visit.     THE DAY OF YOUR APPOINTMENT  Approximately 15 minutes prior to your scheduled appointment, you will receive a telephone call from one of [facility] team - your caller ID may say "Unknown caller."  Our staff will confirm medications, vital signs for the day and any symptoms you may be experiencing. Please have this information available prior to the time of visit start. It may also be helpful for you to have a pad of paper and pen handy for any instructions given during your visit. They will also walk you through joining the smartphone meeting if this is a video visit.    CONSENT FOR TELE-HEALTH VISIT - PLEASE REVIEW  I hereby voluntarily request, consent and authorize [facility] and its employed or contracted physicians, physician  assistants, nurse practitioners or other licensed health care professionals (the Practitioner), to provide me with telemedicine health care services (the "Services") as deemed necessary by the treating Practitioner. I acknowledge and consent to receive the Services by the Practitioner via telemedicine. I understand that the telemedicine visit will involve communicating with the Practitioner through live audiovisual communication technology and the disclosure of certain medical information by electronic transmission. I acknowledge that I have been given the opportunity to request an in-person assessment or other available alternative prior to the telemedicine visit and am voluntarily participating in the telemedicine visit.  I understand that I have the right to withhold or withdraw my consent to the use of telemedicine in the course of my care at any time, without affecting my right to future care or treatment, and that the Practitioner or I may terminate the telemedicine visit at any time. I understand that I have the right to inspect all information obtained and/or recorded in the course of the telemedicine visit and may receive copies of available information for a reasonable fee.  I understand that some of the potential risks of receiving the Services via telemedicine include:  . Delay or interruption in medical evaluation due to technological equipment failure or disruption; . Information transmitted may not be sufficient (e.g. poor resolution of images) to allow for appropriate medical decision making by the Practitioner; and/or  . In rare instances, security protocols could fail, causing a breach of personal health information.  Furthermore, I acknowledge that it is my responsibility to provide information about my medical history, conditions and care that is complete and accurate to the best of my ability. I acknowledge that Practitioner's advice, recommendations, and/or decision may be based on  factors not within their control, such as incomplete or inaccurate data provided by me or distortions of diagnostic images or specimens that may result from electronic transmissions. I understand that the practice of medicine is not an exact science and that Practitioner makes no warranties or guarantees regarding treatment outcomes. I acknowledge that I will receive a copy of this consent concurrently upon execution via email to the email address I last provided but may also request a printed copy by calling the office of [facility].    I understand that my insurance will be billed for this visit.   I have read or had this consent read to me. . I understand the contents of this consent, which adequately explains the benefits and risks of the Services being provided via telemedicine.  . I have been provided ample opportunity to ask questions regarding this consent and the Services and have had my questions answered to my satisfaction. . I give my informed consent for the services to be provided through the use of telemedicine in my medical care  By participating in this telemedicine visit I agree to the above.  

## 2018-07-26 NOTE — Telephone Encounter (Signed)
I called pt back & switched her from [place] to [place] with Dr Burt Knack and moved her appt sooner to 6/3.Marland KitchenConsent was documented.

## 2018-07-31 ENCOUNTER — Ambulatory Visit (INDEPENDENT_AMBULATORY_CARE_PROVIDER_SITE_OTHER): Payer: Medicare Other | Admitting: Urology

## 2018-07-31 DIAGNOSIS — Z8551 Personal history of malignant neoplasm of bladder: Secondary | ICD-10-CM

## 2018-08-01 ENCOUNTER — Encounter: Payer: Self-pay | Admitting: Cardiovascular Disease

## 2018-08-01 ENCOUNTER — Other Ambulatory Visit: Payer: Self-pay

## 2018-08-01 ENCOUNTER — Telehealth (INDEPENDENT_AMBULATORY_CARE_PROVIDER_SITE_OTHER): Payer: Medicare Other | Admitting: Cardiovascular Disease

## 2018-08-01 VITALS — BP 132/43 | HR 70 | Ht 62.0 in | Wt 200.0 lb

## 2018-08-01 DIAGNOSIS — I5023 Acute on chronic systolic (congestive) heart failure: Secondary | ICD-10-CM

## 2018-08-01 DIAGNOSIS — I4819 Other persistent atrial fibrillation: Secondary | ICD-10-CM

## 2018-08-01 MED ORDER — POTASSIUM CHLORIDE ER 20 MEQ PO TBCR: 20.0000 meq | EXTENDED_RELEASE_TABLET | Freq: Every day | ORAL | 2 refills | Status: DC

## 2018-08-01 MED ORDER — SPIRONOLACTONE 25 MG PO TABS: 25.0000 mg | ORAL_TABLET | Freq: Every day | ORAL | 3 refills | Status: DC

## 2018-08-01 NOTE — Progress Notes (Signed)
Virtual Visit via Telephone Note   This visit type was conducted due to national recommendations for restrictions regarding the COVID-19 Pandemic (e.g. social distancing) in an effort to limit this patient's exposure and mitigate transmission in our community.  Due to her co-morbid illnesses, this patient is at least at moderate risk for complications without adequate follow up.  This format is felt to be most appropriate for this patient at this time.  The patient did not have access to video technology/had technical difficulties with video requiring transitioning to audio format only (telephone).  All issues noted in this document were discussed and addressed.  No physical exam could be performed with this format.  Please refer to the patient's chart for her  consent to telehealth for [facility].   Date:  08/01/2018   ID:  Samantha [name], Samantha [name] 01/18/1951, MRN [id number]  Patient Location: Home Provider Location: Home  PCP:  [name], MD  Cardiologist:  Sherren Mocha, MD  Electrophysiologist:  None   Evaluation Performed:  Follow-Up Visit  Chief Complaint:  Shortness of breath  History of Present Illness:    Samantha [name] is a 68 y.o. female with history of coronary artery disease status post remote CABG with multiple PCI procedures.  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  The patient has developed problems with chronic combined systolic and diastolic heart failure, persistent atrial fibrillation, and diabetes.  She is not a candidate for anticoagulation with esophageal varices and severe thrombocytopenia.  Her LV function has improved with aggressive medical therapy as assessed by serial echo studies.  The patient was last hospitalized in February 2020 with decompensated heart failure and worsening anemia.  She continues to have problems with leg swelling and  shortness of breath.  She recently had her torsemide dose increased from 40 mg daily to 40 mg twice daily over a period of 5 days and is now back on 40 mg of torsemide once daily.  She denies chest pain or pressure, lightheadedness, or syncope.  She continues to have problems with orthopnea.  The patient does not have symptoms concerning for COVID-19 infection (fever, chills, cough, or new shortness of breath).    Past Medical History:  Diagnosis Date  . Chronic combined systolic and diastolic CHF (congestive heart failure) ([place])   . Cirrhosis of liver ([place])   . CKD (chronic kidney disease), stage III ([place])   . Coronary artery disease    a. s/p CABG 2001 w/ (LIMA-OM, SVG-D1, SVG-RCA). b. h/o multiple PCIs per Dr. Antionette Char note.  . Depression   . Esophageal varices ([place])    New 2013  . Gastroesophageal reflux disease   . History of pneumonia   . Hyperlipidemia   . Hypertension   . Obesity   . Osteoarthritis   . Pancytopenia ([place])   . Paroxysmal atrial flutter ([place])    a. dx 05/2016.  [name] Persistent atrial fibrillation    a. reported in hosp 07/2016, not on anticoag due to cirrhosis and liver disease, low platelets, varices.  . Thrombocytopenia ([place])    Past Surgical History:  Procedure Laterality Date  . ABDOMINAL HYSTERECTOMY    . BACK SURGERY    . CARDIAC CATHETERIZATION  2004   left internal mammary artery to the obtuse marginal  was found to be small and thread like.  The two grafts were patent.  The left circumflex had 90% in-stent  restenosis and cutting balloon angioplasty was performed followed by placement of a 3.0 x 88m Taxus drug -eluting stent.    .Marland KitchenCARDIAC CATHETERIZATION  2006   There was in-stent restenosis in the left circumflex and this was treated with cutting balloon angioplasty   . CARDIAC CATHETERIZATION  2008   vein graft to the to the obtuse marginal was patent, although small, left circumflex had 40% in-stent restenosis, ejection fraction 40-45%.  The patient  was medically mananged.  .Marland KitchenCARDIAC CATHETERIZATION N/A 01/09/2015   Procedure: Left Heart Cath and Cors/Grafts Angiography;  Surgeon: HBelva Crome MD;  [facility];  Service: Cardiovascular;  Laterality: N/A;  . COLONOSCOPY  03/29/2011   Procedure: COLONOSCOPY;  Surgeon: MJamesetta So MD;  [facility];  Service: Gastroenterology;  Laterality: N/A;  . CORONARY ARTERY BYPASS GRAFT  May 31,2001   x 3 with a vein graft to the first diagonal, vein graft to the right coronary  artery, and a free left internal mammary  artery to the obtuse marginal   . ESOPHAGEAL BANDING N/A 07/04/2012   Procedure: ESOPHAGEAL BANDING;  Surgeon: NRogene Houston MD;  [facility];  Service: Endoscopy;  Laterality: N/A;  . ESOPHAGEAL BANDING N/A 09/17/2012   Procedure: ESOPHAGEAL BANDING;  Surgeon: NRogene Houston MD;  [facility];  Service: Endoscopy;  Laterality: N/A;  . ESOPHAGEAL BANDING N/A 10/22/2013   Procedure: ESOPHAGEAL BANDING;  Surgeon: NRogene Houston MD;  [facility];  Service: Endoscopy;  Laterality: N/A;  . ESOPHAGEAL BANDING N/A 11/28/2014   Procedure: ESOPHAGEAL BANDING;  Surgeon: NRogene Houston MD;  [facility];  Service: Endoscopy;  Laterality: N/A;  . ESOPHAGEAL BANDING N/A 10/29/2015   Procedure: ESOPHAGEAL BANDING;  Surgeon: NRogene Houston MD;  [facility];  Service: Endoscopy;  Laterality: N/A;  . ESOPHAGOGASTRODUODENOSCOPY  12/20/2011   Procedure: ESOPHAGOGASTRODUODENOSCOPY (EGD);  Surgeon: MJamesetta So MD;  [facility];  Service: Gastroenterology;  Laterality: N/A;  . ESOPHAGOGASTRODUODENOSCOPY N/A 07/04/2012   Procedure: ESOPHAGOGASTRODUODENOSCOPY (EGD);  Surgeon: NRogene Houston MD;  [facility];  Service: Endoscopy;  Laterality: N/A;  235-moved to 255 Ann to notify pt  . ESOPHAGOGASTRODUODENOSCOPY N/A 09/17/2012   Procedure: ESOPHAGOGASTRODUODENOSCOPY (EGD);  Surgeon: NRogene Houston MD;  [facility];  Service: Endoscopy;  Laterality: N/A;  730  . ESOPHAGOGASTRODUODENOSCOPY N/A 10/22/2013   Procedure: ESOPHAGOGASTRODUODENOSCOPY (EGD);  Surgeon: NRogene Houston MD;  [facility];  Service: Endoscopy;  Laterality: N/A;  730  . ESOPHAGOGASTRODUODENOSCOPY N/A 11/28/2014   Procedure: ESOPHAGOGASTRODUODENOSCOPY (EGD);  Surgeon: NRogene Houston MD;  [facility];  Service: Endoscopy;  Laterality: N/A;  1:25  . ESOPHAGOGASTRODUODENOSCOPY N/A 10/29/2015   Procedure: ESOPHAGOGASTRODUODENOSCOPY (EGD);  Surgeon: NRogene Houston MD;  [facility];  Service: Endoscopy;  Laterality: N/A;  12:00  . ESOPHAGOGASTRODUODENOSCOPY N/A 12/12/2016   Procedure: ESOPHAGOGASTRODUODENOSCOPY (EGD);  Surgeon: RRogene Houston MD;  [facility];  Service: Endoscopy;  Laterality: N/A;  225  . FLEXIBLE SIGMOIDOSCOPY N/A 03/20/2017   Procedure: FLEXIBLE SIGMOIDOSCOPY;  Surgeon: RRogene Houston MD;  [facility];  Service: Endoscopy;  Laterality: N/A;  7:30  . JOINT REPLACEMENT    . LEFT HEART CATH AND CORS/GRAFTS ANGIOGRAPHY N/A 08/15/2016   Procedure: Left Heart Cath and Cors/Grafts Angiography;  Surgeon: VJettie Booze MD;  [facility];  Service:  Cardiovascular;  Laterality: N/A;  . LEFT HEART CATHETERIZATION WITH CORONARY/GRAFT ANGIOGRAM N/A 12/25/2013   Procedure: LEFT HEART CATHETERIZATION WITH Beatrix Fetters;  Surgeon: Blane Ohara, MD;  Location: [facility];  Service: Cardiovascular;  Laterality: N/A;  . RIGHT HEART CATH N/A 05/29/2017   Procedure: RIGHT HEART CATH;  Surgeon: Jolaine Artist, MD;  Location: [facility];  Service: Cardiovascular;  Laterality: N/A;  . rotator cuff left  2007  . TONSILLECTOMY       Current Meds  Medication Sig  . albuterol (PROVENTIL HFA;VENTOLIN HFA) 108 (90 BASE) MCG/ACT inhaler Inhale 2 puffs into the lungs every 6 (six) hours as needed for  wheezing or shortness of breath.   [name] atorvastatin (LIPITOR) 20 MG tablet Take 20 mg by mouth every evening.   . calcium carbonate (TUMS - DOSED IN MG ELEMENTAL CALCIUM) 500 MG chewable tablet Chew 1 tablet by mouth daily as needed for indigestion or heartburn.  . ezetimibe (ZETIA) 10 MG tablet Take 10 mg by mouth at bedtime.   [name] FLUoxetine (PROZAC) 20 MG capsule Take 20 mg by mouth daily.   [name] HYDROcodone-acetaminophen (NORCO) 10-325 MG tablet Take 1 tablet by mouth as needed for pain.  . isosorbide mononitrate (IMDUR) 60 MG 24 hr tablet Take 1.5 tablets (90 mg total) by mouth daily.  . metoprolol succinate (TOPROL XL) 25 MG 24 hr tablet Take 1 tablet (25 mg total) by mouth daily.  . multivitamin (VIT W/EXTRA C) CHEW chewable tablet Chew 1 tablet by mouth.  [name] NITROSTAT 0.4 MG SL tablet Place 0.4 mg under the tongue every 5 (five) minutes as needed for chest pain (x 3 tabs daily).   . pantoprazole (PROTONIX) 40 MG tablet Take 40 mg by mouth daily.  . polyethylene glycol (MIRALAX / GLYCOLAX) packet Take 17 g by mouth daily.  . potassium chloride 20 MEQ TBCR Take 20 mEq by mouth daily.  [name] torsemide (DEMADEX) 20 MG tablet Take 2 tablets (40 mg total) by mouth daily.  . valsartan (DIOVAN) 160 MG tablet Take 1 tablet (160 mg total) by mouth at bedtime.     Allergies:   Entresto [sacubitril-valsartan]; Acetaminophen; Oxycodone; Ace inhibitors; Cefaclor; Cephalexin; Penicillins; Pregabalin; and Tape   Social History   Tobacco Use  . Smoking status: Former Smoker    Packs/day: 0.50    Years: 20.00    Pack years: 10.00    Types: Cigarettes    Last attempt to quit: 07/21/1995    Years since quitting: 23.0  . Smokeless tobacco: Never Used  Substance Use Topics  . Alcohol use: No  . Drug use: No     Family Hx: The patient's family history includes Coronary artery disease in her sister; Heart attack in her mother; Heart attack (age of onset: 73) in her father. There is no history of Colon cancer.   ROS:   Please see the history of present illness.    All other systems reviewed and are negative.   Prior CV studies:   The following studies were reviewed today:  2D echocardiogram 11/15/2017: ------------------------------------------------------------------- Study Conclusions  - Left ventricle: The cavity size was normal. Wall thickness was   normal. Systolic function was mildly to moderately reduced. The   estimated ejection fraction was in the range of 40% to 45%.   Inferior and lateral hypokinesis. Doppler parameters are   consistent with pseudonormal left ventricular relaxation (grade 2   diastolic dysfunction). The E/e&' ratio is >15, suggesting   elevated  LV filling pressure. - Left atrium: The atrium was mildly dilated. - Tricuspid valve: There was trivial regurgitation. - Pulmonary arteries: PA peak pressure: 37 mm Hg (S). - Inferior vena cava: The vessel was dilated. The respirophasic   diameter changes were blunted (< 50%), consistent with elevated   central venous pressure.  Impressions:  - Compared to a prior study in 04/2017, the LVEF is lower at 40-45%   with inferior and lateral wall hypokinesis, grade 2 DD and   elevated LV filling pressure.  Labs/Other Tests and Data Reviewed:    EKG:  An ECG dated 05/02/2018 was personally reviewed today and demonstrated:  Normal sinus rhythm 67 bpm, nonspecific IVCD  Recent Labs: 04/12/2018: TSH 1.594 04/17/2018: B Natriuretic Peptide 140.2 04/19/2018: ALT 12 04/20/2018: BUN 7; Creatinine, Ser 0.80; Magnesium 1.9; Potassium 3.9; Sodium 139 05/02/2018: Hemoglobin 11.6; Platelets 58   Recent Lipid Panel Lab Results  Component Value Date/Time   CHOL 111 05/26/2017 06:37 AM   TRIG 93 05/26/2017 06:37 AM   HDL 38 (L) 05/26/2017 06:37 AM   CHOLHDL 2.9 05/26/2017 06:37 AM   LDLCALC 54 05/26/2017 06:37 AM    Wt Readings from Last 3 Encounters:  08/01/18 200 lb (90.7 kg)  05/02/18 197 lb (89.4 kg)  05/01/18 196 lb 4.8  oz (89 kg)     Objective:    Vital Signs:  BP (!) 132/43 (BP Location: Right Arm, Patient Position: Sitting, Cuff Size: Normal)   Pulse 70   Ht 5' 2"  (1.575 m)   Wt 200 lb (90.7 kg)   LMP 03/29/2011   BMI 36.58 kg/m    VITAL SIGNS:  reviewed The patient is alert, oriented, in no acute distress.  She is breathing comfortably in normal conversation.  ASSESSMENT & PLAN:    1. Acute on chronic combined systolic and diastolic heart failure: Complicated by advanced stage cirrhosis.  I reviewed her medications again today.  She has previously been on Spironolactone but this is fallen off of her medicine list.  I carefully reviewed her medical records over the last 6 months and cannot find a specific reason why this was stopped.  Today I recommended that she continue on her current dose of torsemide, add Spironolactone 25 mg daily, and stop potassium supplementation.  I would like her to have a metabolic panel checked in 1 to 2 weeks.  I would like her to have follow-up with Richardson Dopp in 3 to 4 weeks. 2. CAD with angina: Not a candidate for antiplatelet therapy.  Continue current medical program.  Fortunately she is not having significant anginal symptoms at present. 3. Persistent atrial fibrillation: Last EKG showed sinus rhythm.  Not a candidate for oral anticoagulation with cirrhosis, esophageal varices, and severe thrombocytopenia.  Continue current medical therapy.  COVID-19 Education: The signs and symptoms of COVID-19 were discussed with the patient and how to seek care for testing (follow up with PCP or arrange E-visit). The importance of social distancing was discussed today.  Time:   Today, I have spent 20 minutes with the patient with telehealth technology discussing the above problems.     Medication Adjustments/Labs and Tests Ordered: Current medicines are reviewed at length with the patient today.  Concerns regarding medicines are outlined above.   Tests Ordered: No orders  of the defined types were placed in this encounter.   Medication Changes: No orders of the defined types were placed in this encounter.   Disposition:  Follow up in 4 week(s) with Nicki Reaper  Kathlen Mody, [place]  Signed, Sherren Mocha, MD  08/01/2018 9:35 AM    [place]

## 2018-08-01 NOTE — Addendum Note (Signed)
Addended by: Harland German A on: 08/01/2018 10:59 AM   Modules accepted: Orders

## 2018-08-01 NOTE — Patient Instructions (Signed)
Medication Instructions:  1) STOP POTASSIUM 2) START SPIRONOLACTONE 25 mg daily  Labwork: Please have labs drawn at Dr. Edythe Lynn office in 1-2 weeks. You do not need to be fasting for this blood work. (Basic metabolic panel).  Follow-Up: You have a phone visit scheduled with Richardson Dopp on August 29, 2018 at 9:15AM.

## 2018-08-06 ENCOUNTER — Ambulatory Visit (INDEPENDENT_AMBULATORY_CARE_PROVIDER_SITE_OTHER): Payer: Medicare Other | Admitting: Internal Medicine

## 2018-08-06 ENCOUNTER — Encounter (INDEPENDENT_AMBULATORY_CARE_PROVIDER_SITE_OTHER): Payer: Self-pay | Admitting: Internal Medicine

## 2018-08-06 ENCOUNTER — Other Ambulatory Visit: Payer: Self-pay

## 2018-08-06 VITALS — BP 144/72 | HR 65 | Temp 98.2°F | Resp 18 | Ht 63.0 in | Wt 202.1 lb

## 2018-08-06 DIAGNOSIS — I8500 Esophageal varices without bleeding: Secondary | ICD-10-CM | POA: Diagnosis not present

## 2018-08-06 DIAGNOSIS — I864 Gastric varices: Secondary | ICD-10-CM | POA: Diagnosis not present

## 2018-08-06 DIAGNOSIS — Z862 Personal history of diseases of the blood and blood-forming organs and certain disorders involving the immune mechanism: Secondary | ICD-10-CM

## 2018-08-06 DIAGNOSIS — K219 Gastro-esophageal reflux disease without esophagitis: Secondary | ICD-10-CM

## 2018-08-06 DIAGNOSIS — K7460 Unspecified cirrhosis of liver: Secondary | ICD-10-CM

## 2018-08-06 NOTE — Progress Notes (Signed)
Presenting complaint;  Follow-up for chronic liver disease.  Database and subjective:  Patient is 68 year old Caucasian female who has cirrhosis secondary to NASH.  She was last seen in March 2020 for follow-up of her liver disease and iron deficiency anemia which responded to p.o. iron therapy.  She has a history of esophageal varices for which she is undergone banding but not has developed gastric varices.  Last EGD was in October 2018.  She had screening ultrasound in March 2020 and was negative for ascites.  Patient states she is doing well other than the fact that she has gained few pounds.  Dr. Burt Knack has started her on spironolactone and she is also on torsemide.  According to our records she has gained 6 pounds in the last 13 weeks.  She says heartburn is well controlled with PPI and she is not having to take H2 B.  She denies dysphagia nausea vomiting abdominal pain melena or rectal bleeding.  She also denies confusion.  She says her bowels move anywhere from 1-6 times per day.  She denies shortness of breath. She had blood work by Dr. Quintin Alto last week which is reviewed below. She lives alone.  She still drives.  Her cousin lives across from her house and her sister lives 5 minutes away. She tells me her paternal aunt died of cirrhosis due to NASH.  Current Medications: Outpatient Encounter Medications as of 08/06/2018  Medication Sig  . albuterol (PROVENTIL HFA;VENTOLIN HFA) 108 (90 BASE) MCG/ACT inhaler Inhale 2 puffs into the lungs every 6 (six) hours as needed for wheezing or shortness of breath.   [name] atorvastatin (LIPITOR) 20 MG tablet Take 20 mg by mouth every evening.   . calcium carbonate (TUMS - DOSED IN MG ELEMENTAL CALCIUM) 500 MG chewable tablet Chew 1 tablet by mouth daily as needed for indigestion or heartburn.  . ezetimibe (ZETIA) 10 MG tablet Take 10 mg by mouth at bedtime.   [name] FLUoxetine (PROZAC) 20 MG capsule Take 20 mg by mouth daily.   [name] HYDROcodone-acetaminophen  (NORCO) 10-325 MG tablet Take 1 tablet by mouth as needed for pain.  . isosorbide mononitrate (IMDUR) 60 MG 24 hr tablet Take 1.5 tablets (90 mg total) by mouth daily.  . metoprolol succinate (TOPROL XL) 25 MG 24 hr tablet Take 1 tablet (25 mg total) by mouth daily.  . multivitamin (VIT W/EXTRA C) CHEW chewable tablet Chew 1 tablet by mouth.  [name] NITROSTAT 0.4 MG SL tablet Place 0.4 mg under the tongue every 5 (five) minutes as needed for chest pain (x 3 tabs daily).   . pantoprazole (PROTONIX) 40 MG tablet Take 40 mg by mouth daily.  . Pediatric Multivitamins-Iron (FLINTSTONES PLUS IRON PO) Take by mouth daily.  . polyethylene glycol (MIRALAX / GLYCOLAX) packet Take 17 g by mouth daily.  [name] spironolactone (ALDACTONE) 25 MG tablet Take 1 tablet (25 mg total) by mouth daily.  [name] torsemide (DEMADEX) 20 MG tablet Take 2 tablets (40 mg total) by mouth daily.  . valsartan (DIOVAN) 160 MG tablet Take 1 tablet (160 mg total) by mouth at bedtime.   No facility-administered encounter medications on file as of 08/06/2018.      Objective: Blood pressure (!) 144/72, pulse 65, temperature 98.2 F (36.8 C), temperature source Oral, resp. rate 18, height 5' 3"  (1.6 m), weight 202 lb 1.6 oz (91.7 kg), last menstrual period 03/29/2011. Patient is alert and in no acute distress. She does not have asterixis. Conjunctiva is pink. Sclera is  nonicteric Oropharyngeal mucosa is normal. No neck masses or thyromegaly noted. Cardiac exam with regular rhythm normal S1 and S2. No murmur or gallop noted. Lungs are clear to auscultation. Abdomen is full but soft and nontender with organomegaly or masses. She has trace edema around ankles.  Labs/studies Results:  Lab data from 05/02/2018 AFP 2.9 WBC 3.9, H&H 11.6 and 39.2 and platelet count 58K.  Lab data from 07/19/2018 WBC 2.8, H&H 11.3 and 36.5 and platelet count 54K. Glucose 108, BUN 12, creatinine 0.83 Sodium 142, potassium 4.1, chloride 101, CO2 27 Calcium  8.9. Bilirubin 0.8, AP 85, AST 46 and ALT 14, total protein 6.6 and albumin 3.6.  Ultrasound in March 2020. Cirrhotic liver without focal abnormalities.  Dilated portal vein but normal directional blood flow and splenomegaly. No evidence of ascites.   Assessment:  #1.  Cirrhosis secondary to NASH.  Her disease has been complicated by portal hypertension and varices as well as thrombocytopenia and leukopenia.  Esophageal varices have been treated and now she has developed gastric varices but has not bled to date.  She is up-to-date regarding screening for [place].  #2.  History of iron deficiency anemia.  Her H&H is near normal.  #3.  GERD.  She is doing well with PPI therapy.  Plan:  Patient advised to contact Dr. Antionette Char office if she continues to gain weight/fluid retention. She will continue pantoprazole [place] chewable with iron and polyethylene glycol as before. Abdominal ultrasound and AFP in September 2020. Office visit in 6 months. Patient advised to report to emergency room if she has melena or frank rectal bleeding.

## 2018-08-06 NOTE — Patient Instructions (Addendum)
Please report to ER if you pass tarry stool or rectal bleeding

## 2018-08-10 ENCOUNTER — Ambulatory Visit: Payer: Medicare Other | Admitting: Cardiovascular Disease

## 2018-08-20 ENCOUNTER — Telehealth (INDEPENDENT_AMBULATORY_CARE_PROVIDER_SITE_OTHER): Payer: Self-pay | Admitting: Internal Medicine

## 2018-08-20 NOTE — Telephone Encounter (Signed)
Patient would like for you to call her at [phone]

## 2018-08-20 NOTE — Telephone Encounter (Signed)
No answer. Will call back tomorrow.

## 2018-08-22 ENCOUNTER — Other Ambulatory Visit: Payer: Self-pay | Admitting: *Deleted

## 2018-08-22 NOTE — Patient Outreach (Signed)
[facility]) Care Management  08/22/2018  AROURA VASUDEVAN [birth year] [id number]   Telephone Screen  Referral Date:  08/20/2018 Referral Source:  [facility] High Risk List Reason for Referral:  Assess Needs Insurance:  [employer]   Outreach Attempt:   Successful telephone outreach to patient to introduce [facility] services as part of [facility] insurance plan to assist with medical needs, education, and social needs, at no cost to the patient.  HIPAA verified with patient.  RN Health Coach introduced self, role and reviewed [facility] services.  Requested to go through screening process with patient and patient declines screening at this time, stating she is managing her conditions well.  Patient does verbally agree to Disease Management outreaches in the future but requesting another outreach in a few weeks to complete initial telephone assessment.  Plan: RN Health Coach will send patient Health Coach Letter and Successful Outreach Letter. RN Health Coach will make next telephone outreach to patient within the month of July to attempt to complete initial telephone assessment.  [place] [phone] [email or web address]

## 2018-08-23 ENCOUNTER — Other Ambulatory Visit: Payer: Self-pay

## 2018-08-23 ENCOUNTER — Other Ambulatory Visit: Payer: Medicare Other | Admitting: *Deleted

## 2018-08-23 DIAGNOSIS — I5023 Acute on chronic systolic (congestive) heart failure: Secondary | ICD-10-CM | POA: Diagnosis not present

## 2018-08-23 LAB — BASIC METABOLIC PANEL
BUN/Creatinine Ratio: 12 (ref 12–28)
BUN: 13 mg/dL (ref 8–27)
CO2: 28 mmol/L (ref 20–29)
Calcium: 9 mg/dL (ref 8.7–10.3)
Chloride: 101 mmol/L (ref 96–106)
Creatinine, Ser: 1.09 mg/dL — ABNORMAL HIGH (ref 0.57–1.00)
GFR calc Af Amer: 59 mL/min/{1.73_m2} — ABNORMAL LOW (ref >59)
GFR calc non Af Amer: 52 mL/min/{1.73_m2} — ABNORMAL LOW (ref >59)
Glucose: 101 mg/dL — ABNORMAL HIGH (ref 65–99)
Potassium: 3.7 mmol/L (ref 3.5–5.2)
Sodium: 142 mmol/L (ref 134–144)

## 2018-08-23 NOTE — Telephone Encounter (Signed)
No answer. Unable to leave message.

## 2018-08-28 NOTE — Progress Notes (Signed)
Virtual Visit via Telephone Note   This visit type was conducted due to national recommendations for restrictions regarding the COVID-19 Pandemic (e.g. social distancing) in an effort to limit this patient's exposure and mitigate transmission in our community.  Due to her co-morbid illnesses, this patient is at least at moderate risk for complications without adequate follow up.  This format is felt to be most appropriate for this patient at this time.  The patient did not have access to video technology/had technical difficulties with video requiring transitioning to audio format only (telephone).  All issues noted in this document were discussed and addressed.  No physical exam could be performed with this format.  Please refer to the patient's chart for her  consent to telehealth for [facility]   Date:  08/29/2018   ID:  ELBERT POLYAKOV, DOB [birth year], MRN [id number]  Patient Location: Home Provider Location: Office  PCP:  [name], MD  Cardiologist:  Sherren Mocha, MD   Electrophysiologist:  None  Advanced Heart Failure Clinic:Daniel Bensimhon, MD (released in 07/2017) GI:  Dr. Laural Golden  Evaluation Performed:  Follow-Up Visit  Chief Complaint:  FU on CHF  History of Present Illness:    KATHRINA CROSLEY is a 68 y.o. female with:  Coronary artery disease s/p CABG in 2001  S/p multiple PCI procedures  Chronic angina  Non-alcoholic cirrhosis  Esophageal varices  Thrombocytopenia (04/2018: PLT 02O)  Chronic systolic CHF EF 37-85  Admx in 3/19 with CHF >> followed with Dr. Haroldine Laws until 6/19 in AHF Clinic   EF 40-45 in 10/2017  admx in 03/2018 with CHF, worsening anemia >> Tx with PRBCs  Diabetes mellitus  Atrial Flutter  Persistent AFib  Not a candidate for anticoagulation due to cirrhosis/varices  Hypertension   Hyperlipidemia   Ms. Guice was last seen in 07/2018 by Dr. Burt Knack via Telemedicine.  She was volume overloaded and she was placed back on  spironolactone.  Since then, she feels much better.  Her breathing is improved and her leg swelling is down.  Her weight is down 4 lbs.  She sleeps on 2 pillows. She has not had paroxysmal nocturnal dyspnea.  She has not had significant chest pain.  She falls a lot.  She does not have syncope.  She has not had any bleeding issues.    The patient does not have symptoms concerning for COVID-19 infection (fever, chills, cough, or new shortness of breath).    Past Medical History:  Diagnosis Date  . Chronic combined systolic and diastolic CHF (congestive heart failure) ([place])   . Cirrhosis of liver ([place])   . CKD (chronic kidney disease), stage III ([place])   . Coronary artery disease    a. s/p CABG 2001 w/ (LIMA-OM, SVG-D1, SVG-RCA). b. h/o multiple PCIs per Dr. Antionette Char note.  . Depression   . Esophageal varices ([place])    New 2013  . Gastroesophageal reflux disease   . History of pneumonia   . Hyperlipidemia   . Hypertension   . Obesity   . Osteoarthritis   . Pancytopenia ([place])   . Paroxysmal atrial flutter ([place])    a. dx 05/2016.  [name] Persistent atrial fibrillation    a. reported in hosp 07/2016, not on anticoag due to cirrhosis and liver disease, low platelets, varices.  . Thrombocytopenia ([place])    Past Surgical History:  Procedure Laterality Date  . ABDOMINAL HYSTERECTOMY    . BACK SURGERY    . CARDIAC CATHETERIZATION  2004  left internal mammary artery to the obtuse marginal  was found to be small and thread like.  The two grafts were patent.  The left circumflex had 90% in-stent restenosis and cutting balloon angioplasty was performed followed by placement of a 3.0 x 28m Taxus drug -eluting stent.    .Marland KitchenCARDIAC CATHETERIZATION  2006   There was in-stent restenosis in the left circumflex and this was treated with cutting balloon angioplasty   . CARDIAC CATHETERIZATION  2008   vein graft to the to the obtuse marginal was patent, although small, left circumflex had 40% in-stent  restenosis, ejection fraction 40-45%.  The patient was medically mananged.  .Marland KitchenCARDIAC CATHETERIZATION N/A 01/09/2015   Procedure: Left Heart Cath and Cors/Grafts Angiography;  Surgeon: HBelva Crome MD;  [facility];  Service: Cardiovascular;  Laterality: N/A;  . COLONOSCOPY  03/29/2011   Procedure: COLONOSCOPY;  Surgeon: MJamesetta So MD;  [facility];  Service: Gastroenterology;  Laterality: N/A;  . CORONARY ARTERY BYPASS GRAFT  May 31,2001   x 3 with a vein graft to the first diagonal, vein graft to the right coronary  artery, and a free left internal mammary  artery to the obtuse marginal   . ESOPHAGEAL BANDING N/A 07/04/2012   Procedure: ESOPHAGEAL BANDING;  Surgeon: NRogene Houston MD;  [facility];  Service: Endoscopy;  Laterality: N/A;  . ESOPHAGEAL BANDING N/A 09/17/2012   Procedure: ESOPHAGEAL BANDING;  Surgeon: NRogene Houston MD;  [facility];  Service: Endoscopy;  Laterality: N/A;  . ESOPHAGEAL BANDING N/A 10/22/2013   Procedure: ESOPHAGEAL BANDING;  Surgeon: NRogene Houston MD;  [facility];  Service: Endoscopy;  Laterality: N/A;  . ESOPHAGEAL BANDING N/A 11/28/2014   Procedure: ESOPHAGEAL BANDING;  Surgeon: NRogene Houston MD;  [facility];  Service: Endoscopy;  Laterality: N/A;  . ESOPHAGEAL BANDING N/A 10/29/2015   Procedure: ESOPHAGEAL BANDING;  Surgeon: NRogene Houston MD;  [facility];  Service: Endoscopy;  Laterality: N/A;  . ESOPHAGOGASTRODUODENOSCOPY  12/20/2011   Procedure: ESOPHAGOGASTRODUODENOSCOPY (EGD);  Surgeon: MJamesetta So MD;  [facility];  Service: Gastroenterology;  Laterality: N/A;  . ESOPHAGOGASTRODUODENOSCOPY N/A 07/04/2012   Procedure: ESOPHAGOGASTRODUODENOSCOPY (EGD);  Surgeon: NRogene Houston MD;  [facility];  Service: Endoscopy;  Laterality: N/A;  235-moved to 255 Ann to notify pt  . ESOPHAGOGASTRODUODENOSCOPY N/A 09/17/2012   Procedure:  ESOPHAGOGASTRODUODENOSCOPY (EGD);  Surgeon: NRogene Houston MD;  [facility];  Service: Endoscopy;  Laterality: N/A;  730  . ESOPHAGOGASTRODUODENOSCOPY N/A 10/22/2013   Procedure: ESOPHAGOGASTRODUODENOSCOPY (EGD);  Surgeon: NRogene Houston MD;  [facility];  Service: Endoscopy;  Laterality: N/A;  730  . ESOPHAGOGASTRODUODENOSCOPY N/A 11/28/2014   Procedure: ESOPHAGOGASTRODUODENOSCOPY (EGD);  Surgeon: NRogene Houston MD;  [facility];  Service: Endoscopy;  Laterality: N/A;  1:25  . ESOPHAGOGASTRODUODENOSCOPY N/A 10/29/2015   Procedure: ESOPHAGOGASTRODUODENOSCOPY (EGD);  Surgeon: NRogene Houston MD;  [facility];  Service: Endoscopy;  Laterality: N/A;  12:00  . ESOPHAGOGASTRODUODENOSCOPY N/A 12/12/2016   Procedure: ESOPHAGOGASTRODUODENOSCOPY (EGD);  Surgeon: RRogene Houston MD;  [facility];  Service: Endoscopy;  Laterality: N/A;  225  . FLEXIBLE SIGMOIDOSCOPY N/A 03/20/2017   Procedure: FLEXIBLE SIGMOIDOSCOPY;  Surgeon: RRogene Houston MD;  [facility];  Service: Endoscopy;  Laterality: N/A;  7:30  . JOINT REPLACEMENT    . LEFT  HEART CATH AND CORS/GRAFTS ANGIOGRAPHY N/A 08/15/2016   Procedure: Left Heart Cath and Cors/Grafts Angiography;  Surgeon: Jettie Booze, MD;  Location: [place] [facility];  Service: Cardiovascular;  Laterality: N/A;  . LEFT HEART CATHETERIZATION WITH CORONARY/GRAFT ANGIOGRAM N/A 12/25/2013   Procedure: LEFT HEART CATHETERIZATION WITH Beatrix Fetters;  Surgeon: Blane Ohara, MD;  Location: [facility] C[facility] LAB;  Service: Cardiovascular;  Laterality: N/A;  . RIGHT HEART CATH N/A 05/29/2017   Procedure: RIGHT HEART CATH;  Surgeon: Jolaine Artist, MD;  Location: [place] [facility] LAB;  Service: Cardiovascular;  Laterality: N/A;  . rotator cuff left  2007  . TONSILLECTOMY       Current Meds  Medication Sig  . albuterol (PROVENTIL HFA;VENTOLIN HFA) 108 (90 BASE) MCG/ACT inhaler Inhale 2  puffs into the lungs every 6 (six) hours as needed for wheezing or shortness of breath.   [name] atorvastatin (LIPITOR) 20 MG tablet Take 20 mg by mouth every evening.   . ezetimibe (ZETIA) 10 MG tablet Take 10 mg by mouth at bedtime.   [name] FLUoxetine (PROZAC) 20 MG capsule Take 20 mg by mouth daily.   [name] HYDROcodone-acetaminophen (NORCO) 10-325 MG tablet Take 1 tablet by mouth as needed for pain.  . isosorbide mononitrate (IMDUR) 60 MG 24 hr tablet Take 1.5 tablets (90 mg total) by mouth daily.  . metoprolol succinate (TOPROL XL) 25 MG 24 hr tablet Take 1 tablet (25 mg total) by mouth daily.  . multivitamin (VIT W/EXTRA C) CHEW chewable tablet Chew 1 tablet by mouth.  [name] NITROSTAT 0.4 MG SL tablet Place 0.4 mg under the tongue every 5 (five) minutes as needed for chest pain (x 3 tabs daily).   . pantoprazole (PROTONIX) 40 MG tablet Take 40 mg by mouth daily.  . Pediatric Multivitamins-Iron (FLINTSTONES PLUS IRON PO) Take by mouth daily.  . polyethylene glycol (MIRALAX / GLYCOLAX) packet Take 17 g by mouth daily.  [name] spironolactone (ALDACTONE) 25 MG tablet Take 1 tablet (25 mg total) by mouth daily.  [name] torsemide (DEMADEX) 20 MG tablet Take 2 tablets (40 mg total) by mouth daily.  . valsartan (DIOVAN) 160 MG tablet Take 1 tablet (160 mg total) by mouth at bedtime.     Allergies:   Entresto [sacubitril-valsartan], Acetaminophen, Oxycodone, Ace inhibitors, Cefaclor, Cephalexin, Penicillins, Pregabalin, and Tape   Social History   Tobacco Use  . Smoking status: Former Smoker    Packs/day: 0.50    Years: 20.00    Pack years: 10.00    Types: Cigarettes    Quit date: 07/21/1995    Years since quitting: 23.1  . Smokeless tobacco: Never Used  Substance Use Topics  . Alcohol use: No  . Drug use: No     Family Hx: The patient's family history includes Coronary artery disease in her sister; Heart attack in her mother; Heart attack (age of onset: 48) in her father. There is no history of Colon cancer.   ROS:   Please see the history of present illness.     All other systems reviewed and are negative.   Prior CV studies:   The following studies were reviewed today:  Echo 11/15/2017 EF 40-45, inferior and lateral hypokinesis, grade 2 diastolic dysfunction, mild LAE, trivial TR, PASP 37   Right heart catheterization 05/29/2017 Findings: RA = 11 RV = 51/12 PA = 51/13 (32) PCW = 18 (v = 25-30) Fick cardiac output/index = 9.7/5.0 Thermo CO/CI = 7.6/3.9 PVR = 1.8 WU FA sat =  96% PA sat = 73%, 76% SVC sat = 67% Assessment: 1. Mild to moderately elevated R-sided pressures 2. Minimally elevated wedge pressure with prominent v-waves likely due to diastolic dysfunction 3. High-cardiac output likely due to cirrhotic physiology  4. No evidence of significant intra-cardiac shunting   Echo 05/25/2017 Mild LVH, EF 50-55, MAC, mild MR, moderate LAE, mildly decreased RVSF, moderate RAE, moderate TR, PASP 54   Echo 01/25/17 Mild LVH, EF 30-35, inferior/inferolateral and anterolateral HK, MAC, mild LAE, moderately reduced RVSF, mild RAE, PASP 27   Cardiac catheterization 08/15/16 LAD D1 ostial 65 LCx ostial stent patent with 40 ISR RCA proximal 95 LIMA-OM 2 occluded SVG-AM. patent SVG-D1 patent  Labs/Other Tests and Data Reviewed:    EKG:  No ECG reviewed.  Recent Labs: 04/12/2018: TSH 1.594 04/17/2018: B Natriuretic Peptide 140.2 04/19/2018: ALT 12 04/20/2018: Magnesium 1.9 05/02/2018: Hemoglobin 11.6; Platelets 58 08/23/2018: BUN 13; Creatinine, Ser 1.09; Potassium 3.7; Sodium 142   Recent Lipid Panel Lab Results  Component Value Date/Time   CHOL 111 05/26/2017 06:37 AM   TRIG 93 05/26/2017 06:37 AM   HDL 38 (L) 05/26/2017 06:37 AM   CHOLHDL 2.9 05/26/2017 06:37 AM   LDLCALC 54 05/26/2017 06:37 AM    Wt Readings from Last 3 Encounters:  08/29/18 198 lb (89.8 kg)  08/06/18 202 lb 1.6 oz (91.7 kg)  08/01/18 200 lb (90.7 kg)     Objective:    Vital Signs:  BP (!) 106/51    Pulse 64   Ht _0  (1.6 m)   Wt 198 lb (89.8 kg)   LMP 03/29/2011   BMI 35.07 kg/m    VITAL SIGNS:  reviewed GEN:  no acute distress RESPIRATORY:  no labored breathing NEURO:  alert and oriented PSYCH:  normal mood  ASSESSMENT & PLAN:    1. Chronic combined systolic and diastolic CHF (congestive heart failure) (HCC) EF 40-45.  She is feeling much better since starting back on Spironolactone.  Her labs were stable last week with normal K+.  Volume seems to be stable now.  Continue current dose of Valsartan, Isosorbide, Metoprolol Succinate, Spironolactone and Torsemide.  She has some low BPs at times. I have asked to call me if this recurs.  In that case I will reduce her Valsartan to 80 mg once daily. FU with me in 3 mos.   2. Coronary artery disease involving native coronary artery of native heart without angina pectoris S/p CABG and multiple PCI procedures. 2/3 grafts were patent in 2018.  She is not a candidate for invasive cardiac evaluation due to her bleeding risk.  She is overall stable.  Continue current therapy.   3. Persistent atrial fibrillation She is not a candidate for anticoagulation due to thrombocytopenia and esophageal varices.  4. Essential hypertension The patient's blood pressure is controlled on her current regimen.  Continue current therapy.  Reduce Valsartan to 80 mg once daily if she has recurrent hypotension.   5. Cirrhosis, non-alcoholic ([place]) Follow up with GI as planned.  No symptoms of bleeding.   6. Educated About Covid-19 Virus Infection The signs and symptoms of COVID-19 were discussed with the patient and how to seek care for testing (follow up with PCP or arrange E-visit).  The importance of social distancing was discussed today.  Time:   Today, I have spent 16.5 minutes with the patient with telehealth technology discussing the above problems.     Medication Adjustments/Labs and Tests Ordered: Current medicines are reviewed at length  with the  patient today.  Concerns regarding medicines are outlined above.   Tests Ordered: No orders of the defined types were placed in this encounter.   Medication Changes: No orders of the defined types were placed in this encounter.   Follow Up:  Virtual Visit or In Person in 3 month(s) with Richardson Dopp, PA-C   Signed, Richardson Dopp, PA-C  08/29/2018 12:38 PM    [place]

## 2018-08-29 ENCOUNTER — Telehealth (INDEPENDENT_AMBULATORY_CARE_PROVIDER_SITE_OTHER): Payer: Medicare Other | Admitting: Physician Assistant

## 2018-08-29 ENCOUNTER — Other Ambulatory Visit: Payer: Self-pay

## 2018-08-29 ENCOUNTER — Telehealth: Payer: Self-pay | Admitting: *Deleted

## 2018-08-29 VITALS — BP 106/51 | HR 64 | Ht 63.0 in | Wt 198.0 lb

## 2018-08-29 DIAGNOSIS — Z7189 Other specified counseling: Secondary | ICD-10-CM

## 2018-08-29 DIAGNOSIS — K7460 Unspecified cirrhosis of liver: Secondary | ICD-10-CM

## 2018-08-29 DIAGNOSIS — I5042 Chronic combined systolic (congestive) and diastolic (congestive) heart failure: Secondary | ICD-10-CM | POA: Diagnosis not present

## 2018-08-29 DIAGNOSIS — I2510 Atherosclerotic heart disease of native coronary artery without angina pectoris: Secondary | ICD-10-CM

## 2018-08-29 DIAGNOSIS — I4819 Other persistent atrial fibrillation: Secondary | ICD-10-CM

## 2018-08-29 DIAGNOSIS — I10 Essential (primary) hypertension: Secondary | ICD-10-CM | POA: Diagnosis not present

## 2018-08-29 NOTE — Telephone Encounter (Signed)
Lvm for patient to call back to be scheduled for 3 month follow up

## 2018-08-29 NOTE — Patient Instructions (Signed)
Medication Instructions:  Your physician recommends that you continue on your current medications as directed. Please refer to the Current Medication list given to you today.  If you need a refill on your cardiac medications before your next appointment, please call your pharmacy.   Lab work: NONE ORDERED  TODAY   If you have labs (blood work) drawn today and your tests are completely normal, you will receive your results only by: [name] MyChart Message (if you have MyChart) OR . A paper copy in the mail If you have any lab test that is abnormal or we need to change your treatment, we will call you to review the results.  Testing/Procedures: NONE ORDERED  TODAY    Follow-Up: At [facility], you and your health needs are our priority.  As part of our continuing mission to provide you with exceptional heart care, we have created designated Provider Care Teams.  These Care Teams include your primary Cardiologist (physician) and Advanced Practice Providers (APPs -  Physician Assistants and Nurse Practitioners) who all work together to provide you with the care you need, when you need it. In 3 months with Samantha [name].   Any Other Special Instructions Will Be Listed Below (If Applicable).

## 2018-09-06 ENCOUNTER — Other Ambulatory Visit: Payer: Self-pay | Admitting: *Deleted

## 2018-09-06 NOTE — Patient Outreach (Signed)
[facility]) Care Management  09/06/2018  Samantha [name] 1950-06-21 [id number]   RN Health Coach Initial Assessment  Referral Date:  08/20/2018 Referral Source:  [facility] High Risk List Reason for Referral:  Assess Needs Insurance:  [employer]   Outreach Attempt:  Outreach attempt #1 to patient for initial telephone assessment. No answer. RN Health Coach left HIPAA compliant voicemail message along with contact information.  Plan:  RN Health Coach will make another outreach attempt to complete initial telephone assessment within the month of July.   [place] [phone] [email or web address]

## 2018-09-17 ENCOUNTER — Other Ambulatory Visit: Payer: Self-pay | Admitting: *Deleted

## 2018-09-17 ENCOUNTER — Encounter: Payer: Self-pay | Admitting: *Deleted

## 2018-09-17 NOTE — Patient Outreach (Signed)
[facility]) Care Management  [place]  09/17/2018   CHEYANN BLECHA 1950-03-29 [id number]   RN Health Coach Initial Assessment   Referral Date:  08/20/2018 Referral Source:  [facility] High Risk List Reason for Referral:  Assess Needs Insurance:  [employer]   Outreach Attempt:  Successful telephone outreach to patient.  HIPAA verified with patient.  Patient completed initial telephone assessment.  Social:  Patient lives at home alone.  Ambulates with walker for long distances and reporting multiple falls in the past year.  Recent fall a few months ago and last fall with injury was July 219 leading to fractured vertebrae.  Transports self to her medical appointments.  DME in the home include:  Straight cane, quad cane, Rolator walker, upper and lower dentures, eyeglasses, bedside commode, shower chair with back, blood pressure cuff, CBG meter, raised toilet seat life, shower chair with back, scale, and nebulizer machine.  Conditions:   Per chart review and discussion with patient, PMH include but not limited to:  Coronary artery disease with multiple stents and bypass grafting, non-alcoholic cirrhosis, chronic systolic heart failure, diabetes, atrial fibrillation.hypertension, hyperlidipdemia, depression, esophageal varices, GERD, and HTN.  Denies any recent emergency room visits or hospitalizations.  Does report increase in lower extremity edema a few weeks ago.  States she contacted Cardiology and they added Aldactone to her medications.  Weight this morning was 184 pounds.  Denies any current shortness of breath or swelling in extremities.  Last Hgb A1C was 5.7 on 07/20/2018 and reports monitoring blood sugars only occasionally if she does not feel well.  States she monitors her blood pressures only occasionally.   Medications:  Reports taking about 13 medications.  Manages medications herself with weekly pill box fills and denies any issues with affording  her medications.  Encounter Medications:  Outpatient Encounter Medications as of 09/17/2018  Medication Sig  . albuterol (PROVENTIL HFA;VENTOLIN HFA) 108 (90 BASE) MCG/ACT inhaler Inhale 2 puffs into the lungs every 6 (six) hours as needed for wheezing or shortness of breath.   [name] atorvastatin (LIPITOR) 20 MG tablet Take 20 mg by mouth every evening.   . ezetimibe (ZETIA) 10 MG tablet Take 10 mg by mouth at bedtime.   [name] FLUoxetine (PROZAC) 20 MG capsule Take 20 mg by mouth daily.   [name] HYDROcodone-acetaminophen (NORCO) 10-325 MG tablet Take 1 tablet by mouth as needed for pain.  . isosorbide mononitrate (IMDUR) 60 MG 24 hr tablet Take 1.5 tablets (90 mg total) by mouth daily.  . metoprolol succinate (TOPROL XL) 25 MG 24 hr tablet Take 1 tablet (25 mg total) by mouth daily.  . multivitamin (VIT W/EXTRA C) CHEW chewable tablet Chew 1 tablet by mouth.  [name] NITROSTAT 0.4 MG SL tablet Place 0.4 mg under the tongue every 5 (five) minutes as needed for chest pain (x 3 tabs daily).   . pantoprazole (PROTONIX) 40 MG tablet Take 40 mg by mouth daily.  . Pediatric Multivitamins-Iron (FLINTSTONES PLUS IRON PO) Take by mouth daily.  . polyethylene glycol (MIRALAX / GLYCOLAX) packet Take 17 g by mouth daily.  [name] spironolactone (ALDACTONE) 25 MG tablet Take 1 tablet (25 mg total) by mouth daily.  [name] torsemide (DEMADEX) 20 MG tablet Take 2 tablets (40 mg total) by mouth daily.  . valsartan (DIOVAN) 160 MG tablet Take 1 tablet (160 mg total) by mouth at bedtime.   No facility-administered encounter medications on file as of 09/17/2018.     Functional Status:  In your present state of health, do you have any difficulty performing the following activities: 09/17/2018 04/18/2018  Hearing? N N  Vision? N N  Difficulty concentrating or making decisions? N N  Walking or climbing stairs? Y N  Comment pain in back with a lot of activity -  Dressing or bathing? N N  Doing errands, shopping? N N  Preparing Food and  eating ? N -  Using the Toilet? N -  In the past six months, have you accidently leaked urine? N -  Do you have problems with loss of bowel control? N -  Managing your Medications? N -  Managing your Finances? N -  Housekeeping or managing your Housekeeping? N -  Some recent data might be hidden    Fall/Depression Screening: Fall Risk  09/17/2018 01/12/2018  Falls in the past year? 1 1  Comment - Emmi Telephone Survey: data to providers prior to load  Number falls in past yr: 1 1  Comment - Emmi Telephone Survey Actual Response = 1  Injury with Fall? 1 1  Comment vertebral fracture July 2019 -  Risk for fall due to : History of fall(s);Impaired balance/gait;Impaired mobility;Impaired vision;Medication side effect;Orthopedic patient -  Follow up Falls evaluation completed;Education provided;Falls prevention discussed -   PHQ 2/9 Scores 09/17/2018  PHQ - 2 Score 1   THN CM Care Plan Problem One     Most Recent Value  Care Plan Problem One  Knowledge deficiet related to self care managment of congestive heart failure and frequent falls.  Role Documenting the Problem One  [place] for Problem One  Active  [facility] Long Term Goal   Patient will not have any hospitalizations within the next 90 days,  THN Long Term Goal Start Date  09/17/18  Interventions for Problem One Long Term Goal  Current care plan and goals reviewed and discussed with patient, encouraged to keep and attend scheduled medical appointments, reviewed medications and indications and encouraged medication compliance, sending 2020 Calendar Booklet to help track medical appointments and document daily weights, reviewed signs and symptoms of heart failure, encouraged continued daily weight monitoring and when to call the physician based on weight, sending Living Better with Heart Failure Education Packet and encourged patient to review when she recieves, introduced heart failure zones to patient  [facility] CM Short Term Goal  #1   Patient will report no falls in the next 30 days.  THN CM Short Term Goal #1 Start Date  09/17/18  Interventions for Short Term Goal #1  EMMI Getting Up From Fall sent, EMMI Preventing Falls sent, fall precautions and preventions reviewed and discussed, encouraged to use walker with all ambulation even in the home, discussed rising slowly from sitting to standing position, encouraged to keep life alert around neck at all times,     Appointments:  Attended appointment with primary care provider, Dr. Quintin Alto on 07/25/2018 and has scheduled follow up in August 2020.  Last seen Cardiology on 08/29/2018.  Advanced Directives:  Denies having an Advance Directive and does not wish to create one at this time.   Consent: [facility] services reviewed and discussed.  Patient verbally agrees to Disease Management outreaches.  Plan: RN Health Coach will send primary MD barriers letter. RN Health Coach will route initial telephone assessment note to primary MD. [place] will send patient [place]. RN Health Coach will send patient 2020 Calendar Booklet. RN Health Coach will send patient Living Well with  Heart Failure Packet. RN Health Coach will send patient EMMI Getting Up From a Fall.

## 2018-09-19 DIAGNOSIS — R252 Cramp and spasm: Secondary | ICD-10-CM | POA: Diagnosis not present

## 2018-09-28 DIAGNOSIS — I10 Essential (primary) hypertension: Secondary | ICD-10-CM | POA: Diagnosis not present

## 2018-09-28 DIAGNOSIS — E785 Hyperlipidemia, unspecified: Secondary | ICD-10-CM | POA: Diagnosis not present

## 2018-10-16 ENCOUNTER — Other Ambulatory Visit: Payer: Self-pay | Admitting: *Deleted

## 2018-10-16 NOTE — Patient Outreach (Signed)
[facility]) Care Management  10/16/2018  ELIM PEALE 1951-01-23 [id number]   RN Health Coach Monthly Outreach  Referral Date:08/20/2018 Referral Source:[employer] High Risk List Reason for Referral:Assess Needs Insurance:[employer]   Outreach Attempt:  Outreach attempt #1 to patient for follow up. No answer. RN Health Coach left HIPAA compliant voicemail message along with contact information.  Plan:  RN Health Coach will make another outreach attempt within the month of August.   Yassin Scales RN [place] [phone] [email or web address]

## 2018-10-22 ENCOUNTER — Other Ambulatory Visit: Payer: Self-pay | Admitting: *Deleted

## 2018-10-22 DIAGNOSIS — Z20822 Contact with and (suspected) exposure to covid-19: Secondary | ICD-10-CM

## 2018-10-23 ENCOUNTER — Encounter (INDEPENDENT_AMBULATORY_CARE_PROVIDER_SITE_OTHER): Payer: Self-pay | Admitting: *Deleted

## 2018-10-23 LAB — NOVEL CORONAVIRUS, NAA: SARS-CoV-2, NAA: NOT DETECTED

## 2018-10-24 ENCOUNTER — Other Ambulatory Visit: Payer: Self-pay | Admitting: *Deleted

## 2018-10-24 NOTE — Patient Outreach (Signed)
[place] [facility]) Care Management  10/24/2018  Samantha [name] 08-17-1950 [id number]   RN Health Coach Monthly Outreach  Referral Date:08/20/2018 Referral Source:[employer] High Risk List Reason for Referral:Assess Needs Insurance:[employer]   Outreach Attempt:  Outreach attempt #2 to patient for follow up. No answer. RN Health Coach left HIPAA compliant voicemail message along with contact information.  Plan:  RN Health Coach will send patient Unsuccessful Outreach Letter.  RN Health Coach will make another outreach attempt within the month of September.  [place] [phone] [name]

## 2018-10-29 DIAGNOSIS — I10 Essential (primary) hypertension: Secondary | ICD-10-CM | POA: Diagnosis not present

## 2018-10-29 DIAGNOSIS — E785 Hyperlipidemia, unspecified: Secondary | ICD-10-CM | POA: Diagnosis not present

## 2018-10-30 ENCOUNTER — Other Ambulatory Visit (INDEPENDENT_AMBULATORY_CARE_PROVIDER_SITE_OTHER): Payer: Self-pay | Admitting: *Deleted

## 2018-10-30 DIAGNOSIS — K7460 Unspecified cirrhosis of liver: Secondary | ICD-10-CM

## 2018-11-02 ENCOUNTER — Encounter

## 2018-11-06 DIAGNOSIS — E119 Type 2 diabetes mellitus without complications: Secondary | ICD-10-CM | POA: Diagnosis not present

## 2018-11-06 DIAGNOSIS — D649 Anemia, unspecified: Secondary | ICD-10-CM | POA: Diagnosis not present

## 2018-11-06 DIAGNOSIS — M545 Low back pain: Secondary | ICD-10-CM | POA: Diagnosis not present

## 2018-11-06 DIAGNOSIS — I5023 Acute on chronic systolic (congestive) heart failure: Secondary | ICD-10-CM | POA: Diagnosis not present

## 2018-11-06 DIAGNOSIS — I10 Essential (primary) hypertension: Secondary | ICD-10-CM | POA: Diagnosis not present

## 2018-11-06 DIAGNOSIS — K7460 Unspecified cirrhosis of liver: Secondary | ICD-10-CM | POA: Diagnosis not present

## 2018-11-06 DIAGNOSIS — G4709 Other insomnia: Secondary | ICD-10-CM | POA: Diagnosis not present

## 2018-11-06 DIAGNOSIS — D696 Thrombocytopenia, unspecified: Secondary | ICD-10-CM | POA: Diagnosis not present

## 2018-11-06 DIAGNOSIS — Z23 Encounter for immunization: Secondary | ICD-10-CM | POA: Diagnosis not present

## 2018-11-06 DIAGNOSIS — G252 Other specified forms of tremor: Secondary | ICD-10-CM | POA: Diagnosis not present

## 2018-11-08 ENCOUNTER — Other Ambulatory Visit: Payer: Self-pay

## 2018-11-08 ENCOUNTER — Ambulatory Visit (HOSPITAL_COMMUNITY)
Admission: RE | Admit: 2018-11-08 | Discharge: 2018-11-08 | Disposition: A | Discharge status: Home / Self Care | Payer: Medicare Other | Source: Ambulatory Visit | Attending: Internal Medicine | Admitting: Internal Medicine

## 2018-11-08 DIAGNOSIS — K7460 Unspecified cirrhosis of liver: Secondary | ICD-10-CM | POA: Diagnosis not present

## 2018-11-21 ENCOUNTER — Encounter: Payer: Self-pay | Admitting: *Deleted

## 2018-11-21 ENCOUNTER — Other Ambulatory Visit: Payer: Self-pay | Admitting: *Deleted

## 2018-11-21 DIAGNOSIS — I10 Essential (primary) hypertension: Secondary | ICD-10-CM | POA: Diagnosis not present

## 2018-11-21 DIAGNOSIS — E559 Vitamin D deficiency, unspecified: Secondary | ICD-10-CM | POA: Diagnosis not present

## 2018-11-21 DIAGNOSIS — E7800 Pure hypercholesterolemia, unspecified: Secondary | ICD-10-CM | POA: Diagnosis not present

## 2018-11-21 DIAGNOSIS — E1140 Type 2 diabetes mellitus with diabetic neuropathy, unspecified: Secondary | ICD-10-CM | POA: Diagnosis not present

## 2018-11-21 DIAGNOSIS — I5020 Unspecified systolic (congestive) heart failure: Secondary | ICD-10-CM | POA: Diagnosis not present

## 2018-11-21 NOTE — Patient Outreach (Signed)
[facility]) Care Management  [place]  11/21/2018   Samantha [name] 03-20-1950 [id number]   RN Health Coach Monthly Outreach   Referral Date:  08/20/2018 Referral Source:  [facility] High Risk List Reason for Referral:  Assess Needs Insurance:  [employer]   Outreach Attempt:  Successful telephone outreach to patient for follow up.  HIPAA verified with patient.  Patient reporting she is doing well, just returning from a 2 week vacation at [facility].  Denies any shortness of breath or swelling in lower extremities.  Continues to weigh daily.  Weight this morning was 186 pounds (near baseline).  Reviewed heart failure action plan and discussed zones with patient.  Patient stating she is in the Yellow Zone and feels she will never be back in the Green Zone due to shortness of breath with exertion.  Discussed when to call provider based on symptoms.  Encounter Medications:  Outpatient Encounter Medications as of 11/21/2018  Medication Sig Note  . albuterol (PROVENTIL HFA;VENTOLIN HFA) 108 (90 BASE) MCG/ACT inhaler Inhale 2 puffs into the lungs every 6 (six) hours as needed for wheezing or shortness of breath.    . ezetimibe (ZETIA) 10 MG tablet Take 10 mg by mouth at bedtime.    [name] FLUoxetine (PROZAC) 20 MG capsule Take 20 mg by mouth daily.    [name] HYDROcodone-acetaminophen (NORCO) 10-325 MG tablet Take 1 tablet by mouth as needed for pain.   . metoprolol succinate (TOPROL XL) 25 MG 24 hr tablet Take 1 tablet (25 mg total) by mouth daily.   . multivitamin (VIT W/EXTRA C) CHEW chewable tablet Chew 1 tablet by mouth.   [name] NITROSTAT 0.4 MG SL tablet Place 0.4 mg under the tongue every 5 (five) minutes as needed for chest pain (x 3 tabs daily).    . pantoprazole (PROTONIX) 40 MG tablet Take 40 mg by mouth daily.   . Pediatric Multivitamins-Iron (FLINTSTONES PLUS IRON PO) Take by mouth daily.   . polyethylene glycol (MIRALAX / GLYCOLAX) packet Take 17 g by mouth  daily.   [name] spironolactone (ALDACTONE) 25 MG tablet Take 1 tablet (25 mg total) by mouth daily.   . valsartan (DIOVAN) 160 MG tablet Take 1 tablet (160 mg total) by mouth at bedtime.   [name] atorvastatin (LIPITOR) 20 MG tablet Take 20 mg by mouth every evening.  11/21/2018: Reports PCP stopped this medication due to leg pain  . isosorbide mononitrate (IMDUR) 60 MG 24 hr tablet Take 1.5 tablets (90 mg total) by mouth daily. 11/21/2018: Continues to take  . torsemide (DEMADEX) 20 MG tablet Take 2 tablets (40 mg total) by mouth daily. 11/21/2018: Continues to take   No facility-administered encounter medications on file as of 11/21/2018.     Functional Status:  In your present state of health, do you have any difficulty performing the following activities: 09/17/2018 04/18/2018  Hearing? N N  Vision? N N  Difficulty concentrating or making decisions? N N  Walking or climbing stairs? Y N  Comment pain in back with a lot of activity -  Dressing or bathing? N N  Doing errands, shopping? N N  Preparing Food and eating ? N -  Using the Toilet? N -  In the past six months, have you accidently leaked urine? N -  Do you have problems with loss of bowel control? N -  Managing your Medications? N -  Managing your Finances? N -  Housekeeping or managing your Housekeeping? N -  Some recent data might be hidden    Fall/Depression Screening: Fall Risk  11/21/2018 09/17/2018 01/12/2018  Falls in the past year? 1 1 1   Comment no falls since August 2020 - Emmi Telephone Survey: data to providers prior to load  Number falls in past yr: 1 1 1   Comment - - [facility]i Telephone Survey Actual Response = 1  Injury with Fall? 1 1 1   Comment - vertebral fracture July 2019 -  Risk for fall due to : History of fall(s);Impaired balance/gait;Impaired mobility;Impaired vision;Medication side effect History of fall(s);Impaired balance/gait;Impaired mobility;Impaired vision;Medication side effect;Orthopedic patient -  Follow up  Falls evaluation completed;Education provided;Falls prevention discussed Falls evaluation completed;Education provided;Falls prevention discussed -   PHQ 2/9 Scores 09/17/2018  PHQ - 2 Score 1   THN CM Care Plan Problem One     Most Recent Value  Care Plan Problem One  Knowledge deficiet related to self care managment of congestive heart failure and frequent falls.  Role Documenting the Problem One  [place] for Problem One  Active  [facility] Long Term Goal   Patient will report no emergency room visits or hospitalizations within the next 90 adys.  THN Long Term Goal Start Date  11/21/18  THN Long Term Goal Met Date  11/21/18  Interventions for Problem One Long Term Goal  Care plan reviewed and discussed, encouraged to keep and attend scheduled medical appintments, reviewed medications and encouraged medication compliance, reviewed signs and symptoms of heart failure and heart failure zones and when to call phyisician, encouraged to continue to weigh daily encouraged to review heart failure education information mailed out, fall precautions and preventions reviewed and discussed  THN CM Short Term Goal #1   Patient will report no falls in the next 30 days.  THN CM Short Term Goal #1 Start Date  09/17/18  [facility] CM Short Term Goal #1 Met Date  11/21/18     Appointments:  Patient states she has upcoming appointment with primary care provider on 11/26/2018.  Plan: RN Health Coach will send primary care provider quarterly update. RN Health Coach will make next telephone outreach to patient within the month of November.  [place] [phone] [name]

## 2018-11-26 DIAGNOSIS — G252 Other specified forms of tremor: Secondary | ICD-10-CM | POA: Diagnosis not present

## 2018-11-26 DIAGNOSIS — I5023 Acute on chronic systolic (congestive) heart failure: Secondary | ICD-10-CM | POA: Diagnosis not present

## 2018-11-26 DIAGNOSIS — I10 Essential (primary) hypertension: Secondary | ICD-10-CM | POA: Diagnosis not present

## 2018-11-26 DIAGNOSIS — H43811 Vitreous degeneration, right eye: Secondary | ICD-10-CM | POA: Diagnosis not present

## 2018-11-26 DIAGNOSIS — E119 Type 2 diabetes mellitus without complications: Secondary | ICD-10-CM | POA: Diagnosis not present

## 2018-11-26 DIAGNOSIS — D72819 Decreased white blood cell count, unspecified: Secondary | ICD-10-CM | POA: Diagnosis not present

## 2018-11-26 DIAGNOSIS — H25813 Combined forms of age-related cataract, bilateral: Secondary | ICD-10-CM | POA: Diagnosis not present

## 2018-11-26 DIAGNOSIS — Z1389 Encounter for screening for other disorder: Secondary | ICD-10-CM | POA: Diagnosis not present

## 2018-11-28 NOTE — Progress Notes (Signed)
[facility] Note  11/29/2018     CHIEF COMPLAINT Patient presents for Retina Evaluation   HISTORY OF PRESENT ILLNESS: Samantha [name] is a 68 y.o. female who presents to the clinic today for:   HPI    Retina Evaluation    In both eyes.  This started 1 week ago.  Duration of 1 week.  Associated Symptoms Floaters and Flashes.  Negative for Blind Spot, Photophobia, Scalp Tenderness, Fever, Pain, Glare, Jaw Claudication, Weight Loss, Distortion, Redness, Trauma, Shoulder/Hip pain and Fatigue.  Context:  distance vision, mid-range vision, near vision and watching TV.  Treatments tried include no treatments.  I, the attending physician,  performed the HPI with the patient and updated documentation appropriately.          Comments    Patient c/o sudden onset of floaters and spider webs for the past week. Saw flashes at onset of symptoms, but no flashes now. Spider webs can sometimes be different colors, such as yellow or blue, but most of the time spider webs look dark. No curtain/veil over vision in either eye.        Last edited by Bernarda Caffey, MD on 11/29/2018  9:14 AM. (History)    pt states she saw Dr. Rosana Hoes this past Monday bc she started seeing floaters, she states she has never had floaters before, she states Dr. Rosana Hoes thought her left eye might have some blood in the back of it, she states the floaters look like spider webs coming from the sides and cascading down from the top of her vision, she states she has had the floaters for about a week, she states started seeing flashes to begin with and then the floaters came, she states she has heart problems, and non-alcoholic cirrhosis of the liver,   Referring physician: Leticia Clas, [place] [place],  [place] [address]  HISTORICAL INFORMATION:   Selected notes from the MEDICAL RECORD NUMBER Referred by Dr. Leticia Clas for concern of new floaters OU and retinal heme OS LEE: 11/26/2018  [BCVA OD: 20/30, OS: 20/20-]   CURRENT MEDICATIONS: No current outpatient medications on file. (Ophthalmic Drugs)   No current facility-administered medications for this visit.  (Ophthalmic Drugs)   Current Outpatient Medications (Other)  Medication Sig  . albuterol (PROVENTIL HFA;VENTOLIN HFA) 108 (90 BASE) MCG/ACT inhaler Inhale 2 puffs into the lungs every 6 (six) hours as needed for wheezing or shortness of breath.   . ezetimibe (ZETIA) 10 MG tablet Take 10 mg by mouth at bedtime.   [name] FLUoxetine (PROZAC) 20 MG capsule Take 20 mg by mouth daily.   [name] HYDROcodone-acetaminophen (NORCO) 10-325 MG tablet Take 1 tablet by mouth as needed for pain.  . metoprolol succinate (TOPROL XL) 25 MG 24 hr tablet Take 1 tablet (25 mg total) by mouth daily.  . multivitamin (VIT W/EXTRA C) CHEW chewable tablet Chew 1 tablet by mouth.  [name] NITROSTAT 0.4 MG SL tablet Place 0.4 mg under the tongue every 5 (five) minutes as needed for chest pain (x 3 tabs daily).   . pantoprazole (PROTONIX) 40 MG tablet Take 40 mg by mouth daily.  . Pediatric Multivitamins-Iron (FLINTSTONES PLUS IRON PO) Take by mouth daily.  . polyethylene glycol (MIRALAX / GLYCOLAX) packet Take 17 g by mouth daily.  [name] spironolactone (ALDACTONE) 25 MG tablet Take 1 tablet (25 mg total) by mouth daily.  . valsartan (DIOVAN) 160 MG tablet Take 1 tablet (160  mg total) by mouth at bedtime.  [name] atorvastatin (LIPITOR) 20 MG tablet Take 20 mg by mouth every evening.   . isosorbide mononitrate (IMDUR) 60 MG 24 hr tablet Take 1.5 tablets (90 mg total) by mouth daily.  [name] torsemide (DEMADEX) 20 MG tablet Take 2 tablets (40 mg total) by mouth daily.   No current facility-administered medications for this visit.  (Other)      REVIEW OF SYSTEMS: ROS    Positive for: Gastrointestinal, Musculoskeletal, Cardiovascular, Eyes, Respiratory   Negative for: Constitutional, Neurological, Skin, Genitourinary, HENT, Endocrine, Psychiatric, Allergic/Imm, Heme/Lymph    Last edited by Roselee Nova D, COT on 11/29/2018  8:47 AM. (History)       ALLERGIES Allergies  Allergen Reactions  . Entresto [Sacubitril-Valsartan] Swelling    Swelling of eyes  . Acetaminophen Other (See Comments)    Liver problems Liver problems Liver problems  Liver problems  . Oxycodone Itching and Nausea Only  . Ace Inhibitors Other (See Comments)    UNSURE OF REACTION TYPE UNSURE OF REACTION TYPE UNSURE OF REACTION TYPE  UNSURE OF REACTION TYPE  . Cefaclor Itching and Rash  . Cephalexin Itching and Rash  . Penicillins Rash    Has patient had a PCN reaction causing immediate rash, facial/tongue/throat swelling, SOB or lightheadedness with hypotension: YES Has patient had a PCN reaction causing severe rash involving mucus membranes or skin necrosis: NO Has patient had a PCN reaction that required hospitalization: YES Has patient had a PCN reaction occurring within the last 10 years: NO If all of the above answers are "NO", then may proceed with Cephalosporin use.   . Pregabalin Other (See Comments)    Retains fluid  . Tape Itching and Rash    Takes skin right off with medical tape--PAPER TAPE ONLY Other reaction(s): Itching Takes skin right off with medical tape--PAPER TAPE ONLY Takes skin right off with medical tape--PAPER TAPE ONLY    PAST MEDICAL HISTORY Past Medical History:  Diagnosis Date  . Chronic combined systolic and diastolic CHF (congestive heart failure) ([place])   . Cirrhosis of liver ([place])   . CKD (chronic kidney disease), stage III   . Coronary artery disease    a. s/p CABG 2001 w/ (LIMA-OM, SVG-D1, SVG-RCA). b. h/o multiple PCIs per Dr. Antionette Char note.  . Depression   . Esophageal varices ([place])    New 2013  . Gastroesophageal reflux disease   . History of pneumonia   . Hyperlipidemia   . Hypertension   . Obesity   . Osteoarthritis   . Pancytopenia ([place])   . Paroxysmal atrial flutter ([place])    a. dx 05/2016.  [name] Persistent atrial  fibrillation ([place])    a. reported in hosp 07/2016, not on anticoag due to cirrhosis and liver disease, low platelets, varices.  . Thrombocytopenia ([place])    Past Surgical History:  Procedure Laterality Date  . ABDOMINAL HYSTERECTOMY    . BACK SURGERY    . CARDIAC CATHETERIZATION  2004   left internal mammary artery to the obtuse marginal  was found to be small and thread like.  The two grafts were patent.  The left circumflex had 90% in-stent restenosis and cutting balloon angioplasty was performed followed by placement of a 3.0 x 40mm Taxus drug -eluting stent.    [name] CARDIAC CATHETERIZATION  2006   There was in-stent restenosis in the left circumflex and this was treated with cutting balloon angioplasty   . CARDIAC CATHETERIZATION  2008   vein graft  to the to the obtuse marginal was patent, although small, left circumflex had 40% in-stent restenosis, ejection fraction 40-45%.  The patient was medically mananged.  [name] CARDIAC CATHETERIZATION N/A 01/09/2015   Procedure: Left Heart Cath and Cors/Grafts Angiography;  Surgeon: Belva Crome, MD;  Location: [facility];  Service: Cardiovascular;  Laterality: N/A;  . COLONOSCOPY  03/29/2011   Procedure: COLONOSCOPY;  Surgeon: Jamesetta So, MD;  [facility];  Service: Gastroenterology;  Laterality: N/A;  . CORONARY ARTERY BYPASS GRAFT  May 31,2001   x 3 with a vein graft to the first diagonal, vein graft to the right coronary  artery, and a free left internal mammary  artery to the obtuse marginal   . ESOPHAGEAL BANDING N/A 07/04/2012   Procedure: ESOPHAGEAL BANDING;  Surgeon: Rogene Houston, MD;  [facility];  Service: Endoscopy;  Laterality: N/A;  . ESOPHAGEAL BANDING N/A 09/17/2012   Procedure: ESOPHAGEAL BANDING;  Surgeon: Rogene Houston, MD;  [facility];  Service: Endoscopy;  Laterality: N/A;  . ESOPHAGEAL BANDING N/A 10/22/2013   Procedure: ESOPHAGEAL BANDING;  Surgeon: Rogene Houston, MD;  [facility];  Service: Endoscopy;  Laterality: N/A;  . ESOPHAGEAL BANDING N/A 11/28/2014   Procedure: ESOPHAGEAL BANDING;  Surgeon: Rogene Houston, MD;  [facility];  Service: Endoscopy;  Laterality: N/A;  . ESOPHAGEAL BANDING N/A 10/29/2015   Procedure: ESOPHAGEAL BANDING;  Surgeon: Rogene Houston, MD;  [facility];  Service: Endoscopy;  Laterality: N/A;  . ESOPHAGOGASTRODUODENOSCOPY  12/20/2011   Procedure: ESOPHAGOGASTRODUODENOSCOPY (EGD);  Surgeon: Jamesetta So, MD;  [facility];  Service: Gastroenterology;  Laterality: N/A;  . ESOPHAGOGASTRODUODENOSCOPY N/A 07/04/2012   Procedure: ESOPHAGOGASTRODUODENOSCOPY (EGD);  Surgeon: Rogene Houston, MD;  [facility];  Service: Endoscopy;  Laterality: N/A;  235-moved to 255 Ann to notify pt  . ESOPHAGOGASTRODUODENOSCOPY N/A 09/17/2012   Procedure: ESOPHAGOGASTRODUODENOSCOPY (EGD);  Surgeon: Rogene Houston, MD;  [facility];  Service: Endoscopy;  Laterality: N/A;  730  . ESOPHAGOGASTRODUODENOSCOPY N/A 10/22/2013   Procedure: ESOPHAGOGASTRODUODENOSCOPY (EGD);  Surgeon: Rogene Houston, MD;  [facility];  Service: Endoscopy;  Laterality: N/A;  730  . ESOPHAGOGASTRODUODENOSCOPY N/A 11/28/2014   Procedure: ESOPHAGOGASTRODUODENOSCOPY (EGD);  Surgeon: Rogene Houston, MD;  [facility];  Service: Endoscopy;  Laterality: N/A;  1:25  . ESOPHAGOGASTRODUODENOSCOPY N/A 10/29/2015   Procedure: ESOPHAGOGASTRODUODENOSCOPY (EGD);  Surgeon: Rogene Houston, MD;  [facility];  Service: Endoscopy;  Laterality: N/A;  12:00  . ESOPHAGOGASTRODUODENOSCOPY N/A 12/12/2016   Procedure: ESOPHAGOGASTRODUODENOSCOPY (EGD);  Surgeon: Rogene Houston, MD;  [facility];  Service: Endoscopy;  Laterality: N/A;  225  . FLEXIBLE SIGMOIDOSCOPY N/A 03/20/2017   Procedure: FLEXIBLE SIGMOIDOSCOPY;  Surgeon: Rogene Houston, MD;  [facility];  Service: Endoscopy;  Laterality:  N/A;  7:30  . JOINT REPLACEMENT    . LEFT HEART CATH AND CORS/GRAFTS ANGIOGRAPHY N/A 08/15/2016   Procedure: Left Heart Cath and Cors/Grafts Angiography;  Surgeon: Jettie Booze, MD;  Location: [facility];  Service: Cardiovascular;  Laterality: N/A;  . LEFT HEART CATHETERIZATION WITH CORONARY/GRAFT ANGIOGRAM N/A 12/25/2013   Procedure: LEFT HEART CATHETERIZATION WITH Beatrix Fetters;  Surgeon: Blane Ohara, MD;  Location: [facility];  Service: Cardiovascular;  Laterality: N/A;  . RIGHT HEART CATH N/A 05/29/2017   Procedure: RIGHT HEART CATH;  Surgeon: Haroldine Laws,  Shaune Pascal, MD;  Location: [facility];  Service: Cardiovascular;  Laterality: N/A;  . rotator cuff left  2007  . TONSILLECTOMY      FAMILY HISTORY Family History  Problem Relation Age of Onset  . Heart attack Mother   . Diabetes Mother   . Heart attack Father 24       cause of death  . Diabetes Father   . Coronary artery disease Sister        CABG   . Diabetes Sister   . Glaucoma Sister   . Diabetes Brother   . Colon cancer Neg Hx     SOCIAL HISTORY Social History   Tobacco Use  . Smoking status: Former Smoker    Packs/day: 0.50    Years: 20.00    Pack years: 10.00    Types: Cigarettes    Quit date: 07/21/1995    Years since quitting: 23.3  . Smokeless tobacco: Never Used  Substance Use Topics  . Alcohol use: No  . Drug use: No         OPHTHALMIC EXAM:  Base Eye Exam    Visual Acuity (Snellen - Linear)      Right Left   Dist cc 20/40 +1 20/25 -2   Dist ph cc NI NI   Correction: Glasses       Tonometry (Tonopen, 9:03 AM)      Right Left   Pressure 13 14       Pupils      Dark Light Shape React APD   Right 3 2 Round Slow None   Left 3 2 Round Slow None       Visual Fields (Counting fingers)      Left Right    Full Full       Extraocular Movement      Right Left    Full, Ortho Full, Ortho       Neuro/Psych    Oriented x3: Yes   Mood/Affect: Normal        Dilation    Both eyes: 1.0% Mydriacyl, 2.5% Phenylephrine @ 9:04 AM        Slit Lamp and Fundus Exam    Slit Lamp Exam      Right Left   Lids/Lashes Dermatochalasis - upper lid Dermatochalasis - upper lid   Conjunctiva/Sclera White and quiet White and quiet   Cornea Mild Arcus, 1+ Punctate epithelial erosions, mild Debris in tear film Mild Arcus, 1+ Punctate epithelial erosions, mild Debris in tear film   Anterior Chamber deep, narrow temporal angle deep, narrow temporal angle   Iris Round and dilated Round and dilated   Lens 2+ Nuclear sclerosis, 2+ Cortical cataract 2+ Nuclear sclerosis, 2+ Cortical cataract   Vitreous Vitreous syneresis, no pigment, Posterior vitreous detachment, vitreous condensations Vitreous syneresis, no pigment, vitreous condensations       Fundus Exam      Right Left   Disc Pink and Sharp Pink and Sharp   C/D Ratio 0.35 0.5   Macula Flat, Blunted foveal reflex, Retinal pigment epithelial mottling, No heme or edema Flat, Blunted foveal reflex, Retinal pigment epithelial mottling, No heme or edema   Vessels mild Vascular attenuation, Tortuous mild Vascular attenuation, Tortuous   Periphery Attached, No RT/RD on 360 scleral depression  Attached           Refraction    Wearing Rx      Sphere Cylinder Axis Add   Right -0.75 +1.25 177 +2.50  Left +0.25 +0.75 148 +2.50   Age: 49 mos   Type: PAL       Manifest Refraction      Sphere Cylinder Axis Dist VA   Right -0.75 +1.00 005 20/30+2   Left +0.25 +1.00 155 20/25+1          IMAGING AND PROCEDURES  Imaging and Procedures for @TODAY @  OCT, Retina - OU - Both Eyes       Right Eye Quality was good. Central Foveal Thickness: 240. Progression has no prior data. Findings include normal foveal contour, no SRF, intraretinal fluid (+cystic changes, trace vitreous opacitites).   Left Eye Quality was good. Central Foveal Thickness: 304. Progression has no prior data. Findings include normal foveal  contour, no IRF, no SRF, vitreomacular adhesion .   Notes *Images captured and stored on drive  Diagnosis / Impression:  NFP, no IRF/SRF OU OD: tr cystic changes, trace vitreous opacities  Clinical management:  See below  Abbreviations: NFP - Normal foveal profile. CME - cystoid macular edema. PED - pigment epithelial detachment. IRF - intraretinal fluid. SRF - subretinal fluid. EZ - ellipsoid zone. ERM - epiretinal membrane. ORA - outer retinal atrophy. ORT - outer retinal tubulation. SRHM - subretinal hyper-reflective material                 ASSESSMENT/PLAN:    ICD-10-CM   1. Posterior vitreous detachment of right eye  H43.811   2. Vitreous syneresis of both eyes  H43.393   3. Retinal edema  H35.81 OCT, Retina - OU - Both Eyes  4. Essential hypertension  I10   5. Hypertensive retinopathy of both eyes  H35.033   6. Combined forms of age-related cataract of both eyes  H25.813     1,2. PVD / vitreous syneresis OU  - symptomatic floaters OU -- OD with frank PVD, OS with posterior hyaloid intact on OCT but +vitreous condensations on exam  - Discussed findings and prognosis  - No RT or RD on 360 scleral depressed exam OD  - Reviewed s/s of RT/RD  - Strict return precautions for any such RT/RD signs/symptoms  - 3-4 weeks, DFE, OCT  3. No retinal edema on exam or OCT  4,5. Hypertensive retinopathy OU  - discussed importance of tight BP control  - monitor  6. Mixed form age related cataract OU  - The symptoms of cataract, surgical options, and treatments and risks were discussed with patient.  - discussed diagnosis and progression  - not yet visually significant  - monitor for now   Ophthalmic Meds Ordered this visit:  No orders of the defined types were placed in this encounter.      Return for f/u 3-4 weeks, PVD OD, DFE, OCT.  There are no Patient Instructions on file for this visit.   Explained the diagnoses, plan, and follow up with the patient and they  expressed understanding.  Patient expressed understanding of the importance of proper follow up care.   Gardiner Sleeper, M.D., Ph.D. Diseases & Surgery of the Retina and Vitreous [facility]  I have reviewed the above documentation for accuracy and completeness, and I agree with the above. Gardiner Sleeper, M.D., Ph.D. 11/29/18 9:31 PM    Abbreviations: M myopia (nearsighted); A astigmatism; H hyperopia (farsighted); P presbyopia; Mrx spectacle prescription;  CTL contact lenses; OD right eye; OS left eye; OU both eyes  XT exotropia; ET esotropia; PEK punctate epithelial keratitis; PEE punctate epithelial erosions; DES  dry eye syndrome; MGD meibomian gland dysfunction; ATs artificial tears; PFAT's preservative free artificial tears; [place] nuclear sclerotic cataract; PSC posterior subcapsular cataract; ERM epi-retinal membrane; PVD posterior vitreous detachment; RD retinal detachment; DM diabetes mellitus; DR diabetic retinopathy; NPDR non-proliferative diabetic retinopathy; PDR proliferative diabetic retinopathy; CSME clinically significant macular edema; DME diabetic macular edema; dbh dot blot hemorrhages; CWS cotton wool spot; POAG primary open angle glaucoma; C/D cup-to-disc ratio; HVF humphrey visual field; GVF goldmann visual field; OCT optical coherence tomography; IOP intraocular pressure; BRVO Branch retinal vein occlusion; CRVO central retinal vein occlusion; CRAO central retinal artery occlusion; BRAO branch retinal artery occlusion; RT retinal tear; SB scleral buckle; PPV pars plana vitrectomy; VH Vitreous hemorrhage; PRP panretinal laser photocoagulation; IVK intravitreal kenalog; VMT vitreomacular traction; MH Macular hole;  NVD neovascularization of the disc; NVE neovascularization elsewhere; AREDS age related eye disease study; ARMD age related macular degeneration; POAG primary open angle glaucoma; EBMD epithelial/anterior basement membrane dystrophy; ACIOL anterior  chamber intraocular lens; IOL intraocular lens; PCIOL posterior chamber intraocular lens; Phaco/IOL phacoemulsification with intraocular lens placement; [place] photorefractive keratectomy; LASIK laser assisted in situ keratomileusis; HTN hypertension; DM diabetes mellitus; COPD chronic obstructive pulmonary disease

## 2018-11-29 ENCOUNTER — Encounter (INDEPENDENT_AMBULATORY_CARE_PROVIDER_SITE_OTHER): Payer: Self-pay | Admitting: Ophthalmology

## 2018-11-29 ENCOUNTER — Ambulatory Visit (INDEPENDENT_AMBULATORY_CARE_PROVIDER_SITE_OTHER): Payer: Medicare Other | Admitting: Ophthalmology

## 2018-11-29 ENCOUNTER — Other Ambulatory Visit: Payer: Self-pay

## 2018-11-29 DIAGNOSIS — H3581 Retinal edema: Secondary | ICD-10-CM | POA: Diagnosis not present

## 2018-11-29 DIAGNOSIS — H35033 Hypertensive retinopathy, bilateral: Secondary | ICD-10-CM | POA: Diagnosis not present

## 2018-11-29 DIAGNOSIS — H43811 Vitreous degeneration, right eye: Secondary | ICD-10-CM | POA: Diagnosis not present

## 2018-11-29 DIAGNOSIS — H43393 Other vitreous opacities, bilateral: Secondary | ICD-10-CM | POA: Diagnosis not present

## 2018-11-29 DIAGNOSIS — I10 Essential (primary) hypertension: Secondary | ICD-10-CM

## 2018-11-29 DIAGNOSIS — H25813 Combined forms of age-related cataract, bilateral: Secondary | ICD-10-CM

## 2018-12-26 ENCOUNTER — Encounter (INDEPENDENT_AMBULATORY_CARE_PROVIDER_SITE_OTHER): Payer: Self-pay | Admitting: Ophthalmology

## 2018-12-26 ENCOUNTER — Ambulatory Visit (INDEPENDENT_AMBULATORY_CARE_PROVIDER_SITE_OTHER): Payer: Medicare Other | Admitting: Ophthalmology

## 2018-12-26 DIAGNOSIS — H43811 Vitreous degeneration, right eye: Secondary | ICD-10-CM

## 2018-12-26 DIAGNOSIS — H43393 Other vitreous opacities, bilateral: Secondary | ICD-10-CM

## 2018-12-26 DIAGNOSIS — I10 Essential (primary) hypertension: Secondary | ICD-10-CM

## 2018-12-26 DIAGNOSIS — H3581 Retinal edema: Secondary | ICD-10-CM | POA: Diagnosis not present

## 2018-12-26 DIAGNOSIS — H35033 Hypertensive retinopathy, bilateral: Secondary | ICD-10-CM

## 2018-12-26 DIAGNOSIS — H3562 Retinal hemorrhage, left eye: Secondary | ICD-10-CM | POA: Diagnosis not present

## 2018-12-26 DIAGNOSIS — H25813 Combined forms of age-related cataract, bilateral: Secondary | ICD-10-CM

## 2018-12-26 NOTE — Progress Notes (Signed)
[facility] Note  12/26/2018  CHIEF COMPLAINT Patient presents for Retina Follow Up  HISTORY OF PRESENT ILLNESS: Samantha [name] is a 68 y.o. female who presents to the clinic today for:   HPI    Retina Follow Up    Patient presents with  PVD.  In both eyes.  This started 4 weeks ago.  Severity is moderate.  Duration of 4 weeks.  Since onset it is gradually improving.  I, the attending physician,  performed the HPI with the patient and updated documentation appropriately.          Comments    68 y/o female pt here for 4 wk f/u for PVD OU.  No change in [place] OU.  Floaters are somewhat better OU, more improved OD.  Denies pain; occasionally sees a temporal flash OD.  No gtts.       Last edited by Bernarda Caffey, MD on 12/26/2018 10:24 AM. (History)    pt states the floaters in her right eye are still there, but the ones in her left eye are a lot better, she denies fol   Referring physician: Leticia Clas, [address] [place],  [place] [address]  HISTORICAL INFORMATION:   Selected notes from the MEDICAL RECORD NUMBER Referred by Dr. Leticia Clas for concern of new floaters OU and retinal heme OS   CURRENT MEDICATIONS: No current outpatient medications on file. (Ophthalmic Drugs)   No current facility-administered medications for this visit.  (Ophthalmic Drugs)   Current Outpatient Medications (Other)  Medication Sig  . albuterol (PROVENTIL HFA;VENTOLIN HFA) 108 (90 BASE) MCG/ACT inhaler Inhale 2 puffs into the lungs every 6 (six) hours as needed for wheezing or shortness of breath.   [name] atorvastatin (LIPITOR) 20 MG tablet Take 20 mg by mouth every evening.   . ezetimibe (ZETIA) 10 MG tablet Take 10 mg by mouth at bedtime.   [name] FLUoxetine (PROZAC) 20 MG capsule Take 20 mg by mouth daily.   [name] HYDROcodone-acetaminophen (NORCO) 10-325 MG tablet Take 1 tablet by mouth as needed for pain.  . isosorbide mononitrate (IMDUR) 60 MG 24 hr tablet  Take 1.5 tablets (90 mg total) by mouth daily.  . metoprolol succinate (TOPROL XL) 25 MG 24 hr tablet Take 1 tablet (25 mg total) by mouth daily.  . multivitamin (VIT W/EXTRA C) CHEW chewable tablet Chew 1 tablet by mouth.  [name] NITROSTAT 0.4 MG SL tablet Place 0.4 mg under the tongue every 5 (five) minutes as needed for chest pain (x 3 tabs daily).   . pantoprazole (PROTONIX) 40 MG tablet Take 40 mg by mouth daily.  . Pediatric Multivitamins-Iron (FLINTSTONES PLUS IRON PO) Take by mouth daily.  . polyethylene glycol (MIRALAX / GLYCOLAX) packet Take 17 g by mouth daily.  [name] spironolactone (ALDACTONE) 25 MG tablet Take 1 tablet (25 mg total) by mouth daily.  [name] torsemide (DEMADEX) 20 MG tablet Take 2 tablets (40 mg total) by mouth daily.  . valsartan (DIOVAN) 160 MG tablet Take 1 tablet (160 mg total) by mouth at bedtime.   No current facility-administered medications for this visit.  (Other)   REVIEW OF SYSTEMS: ROS    Positive for: Gastrointestinal, Musculoskeletal, Cardiovascular, Eyes   Negative for: Constitutional, Neurological, Skin, Genitourinary, HENT, Endocrine, Respiratory, Psychiatric, Allergic/Imm, Heme/Lymph   Last edited by Matthew Folks, COA on 12/26/2018  9:15 AM. (History)     ALLERGIES Allergies  Allergen Reactions  . Delene Loll [  Sacubitril-Valsartan] Swelling    Swelling of eyes  . Acetaminophen Other (See Comments)    Liver problems Liver problems Liver problems  Liver problems  . Oxycodone Itching and Nausea Only  . Ace Inhibitors Other (See Comments)    UNSURE OF REACTION TYPE UNSURE OF REACTION TYPE UNSURE OF REACTION TYPE  UNSURE OF REACTION TYPE  . Cefaclor Itching and Rash  . Cephalexin Itching and Rash  . Penicillins Rash    Has patient had a PCN reaction causing immediate rash, facial/tongue/throat swelling, SOB or lightheadedness with hypotension: YES Has patient had a PCN reaction causing severe rash involving mucus membranes or skin necrosis:  NO Has patient had a PCN reaction that required hospitalization: YES Has patient had a PCN reaction occurring within the last 10 years: NO If all of the above answers are "NO", then may proceed with Cephalosporin use.   . Pregabalin Other (See Comments)    Retains fluid  . Tape Itching and Rash    Takes skin right off with medical tape--PAPER TAPE ONLY Other reaction(s): Itching Takes skin right off with medical tape--PAPER TAPE ONLY Takes skin right off with medical tape--PAPER TAPE ONLY    PAST MEDICAL HISTORY Past Medical History:  Diagnosis Date  . Cataract    OU  . Chronic combined systolic and diastolic CHF (congestive heart failure) ([place])   . Cirrhosis of liver ([place])   . CKD (chronic kidney disease), stage III   . Coronary artery disease    a. s/p CABG 2001 w/ (LIMA-OM, SVG-D1, SVG-RCA). b. h/o multiple PCIs per Dr. Antionette Char note.  . Depression   . Esophageal varices ([place])    New 2013  . Gastroesophageal reflux disease   . History of pneumonia   . Hyperlipidemia   . Hypertension   . Hypertensive retinopathy    OU  . Obesity   . Osteoarthritis   . Pancytopenia ([place])   . Paroxysmal atrial flutter ([place])    a. dx 05/2016.  [name] Persistent atrial fibrillation ([place])    a. reported in hosp 07/2016, not on anticoag due to cirrhosis and liver disease, low platelets, varices.  . Thrombocytopenia ([place])    Past Surgical History:  Procedure Laterality Date  . ABDOMINAL HYSTERECTOMY    . BACK SURGERY    . CARDIAC CATHETERIZATION  2004   left internal mammary artery to the obtuse marginal  was found to be small and thread like.  The two grafts were patent.  The left circumflex had 90% in-stent restenosis and cutting balloon angioplasty was performed followed by placement of a 3.0 x 27mm Taxus drug -eluting stent.    [name] CARDIAC CATHETERIZATION  2006   There was in-stent restenosis in the left circumflex and this was treated with cutting balloon angioplasty   . CARDIAC  CATHETERIZATION  2008   vein graft to the to the obtuse marginal was patent, although small, left circumflex had 40% in-stent restenosis, ejection fraction 40-45%.  The patient was medically mananged.  [name] CARDIAC CATHETERIZATION N/A 01/09/2015   Procedure: Left Heart Cath and Cors/Grafts Angiography;  Surgeon: Belva Crome, MD;  Location: [facility];  Service: Cardiovascular;  Laterality: N/A;  . COLONOSCOPY  03/29/2011   Procedure: COLONOSCOPY;  Surgeon: Jamesetta So, MD;  [facility];  Service: Gastroenterology;  Laterality: N/A;  . CORONARY ARTERY BYPASS GRAFT  May 31,2001   x 3 with a vein graft to the first diagonal, vein graft to the right coronary  artery,  and a free left internal mammary  artery to the obtuse marginal   . ESOPHAGEAL BANDING N/A 07/04/2012   Procedure: ESOPHAGEAL BANDING;  Surgeon: Rogene Houston, MD;  [facility];  Service: Endoscopy;  Laterality: N/A;  . ESOPHAGEAL BANDING N/A 09/17/2012   Procedure: ESOPHAGEAL BANDING;  Surgeon: Rogene Houston, MD;  [facility];  Service: Endoscopy;  Laterality: N/A;  . ESOPHAGEAL BANDING N/A 10/22/2013   Procedure: ESOPHAGEAL BANDING;  Surgeon: Rogene Houston, MD;  [facility];  Service: Endoscopy;  Laterality: N/A;  . ESOPHAGEAL BANDING N/A 11/28/2014   Procedure: ESOPHAGEAL BANDING;  Surgeon: Rogene Houston, MD;  [facility];  Service: Endoscopy;  Laterality: N/A;  . ESOPHAGEAL BANDING N/A 10/29/2015   Procedure: ESOPHAGEAL BANDING;  Surgeon: Rogene Houston, MD;  [facility];  Service: Endoscopy;  Laterality: N/A;  . ESOPHAGOGASTRODUODENOSCOPY  12/20/2011   Procedure: ESOPHAGOGASTRODUODENOSCOPY (EGD);  Surgeon: Jamesetta So, MD;  [facility];  Service: Gastroenterology;  Laterality: N/A;  . ESOPHAGOGASTRODUODENOSCOPY N/A 07/04/2012   Procedure: ESOPHAGOGASTRODUODENOSCOPY (EGD);  Surgeon: Rogene Houston, MD;  [facility];   Service: Endoscopy;  Laterality: N/A;  235-moved to 255 Ann to notify pt  . ESOPHAGOGASTRODUODENOSCOPY N/A 09/17/2012   Procedure: ESOPHAGOGASTRODUODENOSCOPY (EGD);  Surgeon: Rogene Houston, MD;  [facility];  Service: Endoscopy;  Laterality: N/A;  730  . ESOPHAGOGASTRODUODENOSCOPY N/A 10/22/2013   Procedure: ESOPHAGOGASTRODUODENOSCOPY (EGD);  Surgeon: Rogene Houston, MD;  [facility];  Service: Endoscopy;  Laterality: N/A;  730  . ESOPHAGOGASTRODUODENOSCOPY N/A 11/28/2014   Procedure: ESOPHAGOGASTRODUODENOSCOPY (EGD);  Surgeon: Rogene Houston, MD;  [facility];  Service: Endoscopy;  Laterality: N/A;  1:25  . ESOPHAGOGASTRODUODENOSCOPY N/A 10/29/2015   Procedure: ESOPHAGOGASTRODUODENOSCOPY (EGD);  Surgeon: Rogene Houston, MD;  [facility];  Service: Endoscopy;  Laterality: N/A;  12:00  . ESOPHAGOGASTRODUODENOSCOPY N/A 12/12/2016   Procedure: ESOPHAGOGASTRODUODENOSCOPY (EGD);  Surgeon: Rogene Houston, MD;  [facility];  Service: Endoscopy;  Laterality: N/A;  225  . FLEXIBLE SIGMOIDOSCOPY N/A 03/20/2017   Procedure: FLEXIBLE SIGMOIDOSCOPY;  Surgeon: Rogene Houston, MD;  [facility];  Service: Endoscopy;  Laterality: N/A;  7:30  . JOINT REPLACEMENT    . LEFT HEART CATH AND CORS/GRAFTS ANGIOGRAPHY N/A 08/15/2016   Procedure: Left Heart Cath and Cors/Grafts Angiography;  Surgeon: Jettie Booze, MD;  Location: [facility];  Service: Cardiovascular;  Laterality: N/A;  . LEFT HEART CATHETERIZATION WITH CORONARY/GRAFT ANGIOGRAM N/A 12/25/2013   Procedure: LEFT HEART CATHETERIZATION WITH Beatrix Fetters;  Surgeon: Blane Ohara, MD;  Location: [facility];  Service: Cardiovascular;  Laterality: N/A;  . RIGHT HEART CATH N/A 05/29/2017   Procedure: RIGHT HEART CATH;  Surgeon: Jolaine Artist, MD;  Location: [facility];  Service: Cardiovascular;  Laterality: N/A;  . rotator cuff left  2007  .  TONSILLECTOMY      FAMILY HISTORY Family History  Problem Relation Age of Onset  . Heart attack Mother   . Diabetes Mother   . Heart attack Father 40       cause of death  . Diabetes Father   . Coronary artery disease Sister        CABG   . Diabetes Sister   . Glaucoma Sister   . Diabetes Brother   . Colon cancer Neg Hx     SOCIAL HISTORY  Social History   Tobacco Use  . Smoking status: Former Smoker    Packs/day: 0.50    Years: 20.00    Pack years: 10.00    Types: Cigarettes    Quit date: 07/21/1995    Years since quitting: 23.4  . Smokeless tobacco: Never Used  Substance Use Topics  . Alcohol use: No  . Drug use: No    OPHTHALMIC EXAM:  Base Eye Exam    Visual Acuity (Snellen - Linear)      Right Left   Dist cc 20/30 -2 20/20 -2   Dist ph cc 20/30    Correction: Glasses       Tonometry (Tonopen, 9:20 AM)      Right Left   Pressure 12 13       Pupils      Dark Light Shape React APD   Right 3 2 Round Slow None   Left 3 2 Round Slow None       Visual Fields (Counting fingers)      Left Right    Full Full       Extraocular Movement      Right Left    Full, Ortho Full, Ortho       Neuro/Psych    Oriented x3: Yes   Mood/Affect: Normal       Dilation    Both eyes: 1.0% Mydriacyl, 2.5% Phenylephrine @ 9:20 AM        Slit Lamp and Fundus Exam    Slit Lamp Exam      Right Left   Lids/Lashes Dermatochalasis - upper lid Dermatochalasis - upper lid   Conjunctiva/Sclera White and quiet White and quiet   Cornea Mild Arcus, 1+ Punctate epithelial erosions, mild Debris in tear film Mild Arcus, 1+ Punctate epithelial erosions, mild Debris in tear film   Anterior Chamber deep, narrow temporal angle deep, narrow temporal angle   Iris Round and dilated Round and dilated   Lens 3+ Nuclear sclerosis, 2-3+ Cortical cataract 2+ Nuclear sclerosis, 2+ Cortical cataract   Vitreous Vitreous syneresis, no pigment, Posterior vitreous detachment, scattered  vitreous condensations, Weiss ring Vitreous syneresis, no pigment, vitreous condensations       Fundus Exam      Right Left   Disc Pink and Sharp Pink and Sharp   C/D Ratio 0.4 0.5   Macula Flat, Blunted foveal reflex, Retinal pigment epithelial mottling, No heme or edema Flat, Blunted foveal reflex, Retinal pigment epithelial mottling, No heme or edema   Vessels mild Vascular attenuation, Tortuous mild Vascular attenuation, Tortuous   Periphery Attached, No RT/RD on 360 exam Attached, 2 blot hemes temporally            IMAGING AND PROCEDURES  Imaging and Procedures for @TODAY @  OCT, Retina - OU - Both Eyes       Right Eye Quality was good. Central Foveal Thickness: 243. Progression has been stable. Findings include normal foveal contour, no SRF, intraretinal fluid (+cystic changes -- stable from prior, mild interval improvement in vitreous opacitites).   Left Eye Quality was good. Central Foveal Thickness: 261. Progression has been stable. Findings include normal foveal contour, no IRF, no SRF, vitreomacular adhesion .   Notes *Images captured and stored on drive  Diagnosis / Impression:  NFP, no IRF/SRF OU OD: tr cystic changes -- stable from prior, mild interval improvement in vitreous opacities  Clinical management:  See below  Abbreviations: NFP - Normal foveal profile. CME - cystoid macular edema. PED -  pigment epithelial detachment. IRF - intraretinal fluid. SRF - subretinal fluid. EZ - ellipsoid zone. ERM - epiretinal membrane. ORA - outer retinal atrophy. ORT - outer retinal tubulation. SRHM - subretinal hyper-reflective material             ASSESSMENT/PLAN:    ICD-10-CM   1. Posterior vitreous detachment of right eye  H43.811   2. Vitreous syneresis of both eyes  H43.393   3. Retinal hemorrhage of left eye  H35.62   4. Retinal edema  H35.81 OCT, Retina - OU - Both Eyes  5. Essential hypertension  I10   6. Hypertensive retinopathy of both eyes  H35.033    7. Combined forms of age-related cataract of both eyes  H25.813     1,2. PVD / vitreous syneresis OU  - symptomatic floaters OU -- OD with frank PVD, OS with posterior hyaloid intact on OCT but +vitreous condensations on exam  - symptoms improving  - Discussed findings and prognosis  - No RT or RD on 360 scleral depressed exam OD  - Reviewed s/s of RT/RD  - Strict return precautions for any such RT/RD signs/symptoms  - 3  months, DFE, OCT  3. Retinal Hemorrhage OS  - 2 blot hemes temporally  - unclear etiology -- no RT/RD associated  - not visually significant at this time - recommend monitoring  - f/u 3 mos  4. No retinal edema on exam or OCT  5,6. Hypertensive retinopathy OU  - discussed importance of tight BP control  - monitor  7. Mixed form age related cataract OU  - OD approaching vision significance  - pt expresses interest in cataract referral  - The symptoms of cataract, surgical options, and treatments and risks were discussed with patient.  - discussed diagnosis and progression  - monitor for now  Ophthalmic Meds Ordered this visit:   Return in about 3 months (around 03/28/2019) for f/u retinal heme OS.  There are no Patient Instructions on file for this visit.  Explained the diagnoses, plan, and follow up with the patient and they expressed understanding.  Patient expressed understanding of the importance of proper follow up care.  Electronically signed by Estill Bakes, COT 12/26/18 9:09 a.m.   Gardiner Sleeper, M.D., Ph.D. Diseases & Surgery of the Retina and [place] 12/28/18   I have reviewed the above documentation for accuracy and completeness, and I agree with the above. Gardiner Sleeper, M.D., Ph.D. 12/28/18 1:12 AM    Abbreviations: M myopia (nearsighted); A astigmatism; H hyperopia (farsighted); P presbyopia; Mrx spectacle prescription;  CTL contact lenses; OD right eye; OS left eye; OU both eyes  XT exotropia;  ET esotropia; PEK punctate epithelial keratitis; PEE punctate epithelial erosions; DES dry eye syndrome; MGD meibomian gland dysfunction; ATs artificial tears; PFAT's preservative free artificial tears; [place] nuclear sclerotic cataract; PSC posterior subcapsular cataract; ERM epi-retinal membrane; PVD posterior vitreous detachment; RD retinal detachment; DM diabetes mellitus; DR diabetic retinopathy; NPDR non-proliferative diabetic retinopathy; PDR proliferative diabetic retinopathy; CSME clinically significant macular edema; DME diabetic macular edema; dbh dot blot hemorrhages; CWS cotton wool spot; POAG primary open angle glaucoma; C/D cup-to-disc ratio; HVF humphrey visual field; GVF goldmann visual field; OCT optical coherence tomography; IOP intraocular pressure; BRVO Branch retinal vein occlusion; CRVO central retinal vein occlusion; CRAO central retinal artery occlusion; BRAO branch retinal artery occlusion; RT retinal tear; SB scleral buckle; PPV pars plana vitrectomy; VH Vitreous hemorrhage; PRP panretinal laser photocoagulation; IVK intravitreal kenalog;  VMT vitreomacular traction; MH Macular hole;  NVD neovascularization of the disc; NVE neovascularization elsewhere; AREDS age related eye disease study; ARMD age related macular degeneration; POAG primary open angle glaucoma; EBMD epithelial/anterior basement membrane dystrophy; ACIOL anterior chamber intraocular lens; IOL intraocular lens; PCIOL posterior chamber intraocular lens; Phaco/IOL phacoemulsification with intraocular lens placement; [place] photorefractive keratectomy; LASIK laser assisted in situ keratomileusis; HTN hypertension; DM diabetes mellitus; COPD chronic obstructive pulmonary disease

## 2018-12-27 ENCOUNTER — Encounter (INDEPENDENT_AMBULATORY_CARE_PROVIDER_SITE_OTHER): Payer: Medicare Other | Admitting: Ophthalmology

## 2018-12-31 DIAGNOSIS — U071 COVID-19: Secondary | ICD-10-CM | POA: Diagnosis not present

## 2019-01-08 ENCOUNTER — Other Ambulatory Visit: Payer: Self-pay | Admitting: *Deleted

## 2019-01-08 ENCOUNTER — Encounter: Payer: Self-pay | Admitting: *Deleted

## 2019-01-08 NOTE — Patient Outreach (Signed)
[facility]) Care Management  01/08/2019  KINYA MEINE 01/08/1951 [id number]   RN Health Coach Every Other Month Outreach  Referral Date:08/20/2018 Referral Source:[employer] High Risk List Reason for Referral:Assess Needs Insurance:[employer] Medicare   Outreach Attempt:  Successful telephone outreach to patient for follow up.  HIPAA verified with patient.  Patient reporting she tested positive for COVID last week.  States she was exposed from a family member testing positive.  Denies any increase in her normal shortness of breath (normally short of breath with exertion).  States her symptoms have resolved around diarrhea and not being able to keep food or liquids down.  Does report her diarrhea is getting better after taking some imodium and is hoping to be able to eat solids today.  Patient stating she overall feels better.  Discussed with patient self isolation and reiterated her instructions to isolate at least until next Tuesday, November 17 unless she continues to have symptoms.  Encouraged patient to seek medical attention if she becomes short of breath more than her normal.  Continues to weigh daily.  Weight this morning was 181 pounds (slightly reduced).  Confirmed patient has access to food and her medications.  Appointments:  Last attended appointment with primary care giver on 11/26/2018.  Plan: RN Health Coach will make next telephone outreach to patient within the month of January.  [place] [phone] [email or web address]

## 2019-01-28 DIAGNOSIS — E119 Type 2 diabetes mellitus without complications: Secondary | ICD-10-CM | POA: Diagnosis not present

## 2019-01-28 DIAGNOSIS — I10 Essential (primary) hypertension: Secondary | ICD-10-CM | POA: Diagnosis not present

## 2019-02-04 DIAGNOSIS — Z1231 Encounter for screening mammogram for malignant neoplasm of breast: Secondary | ICD-10-CM | POA: Diagnosis not present

## 2019-02-05 ENCOUNTER — Ambulatory Visit (INDEPENDENT_AMBULATORY_CARE_PROVIDER_SITE_OTHER): Payer: Medicare Other | Admitting: Internal Medicine

## 2019-02-05 ENCOUNTER — Encounter (INDEPENDENT_AMBULATORY_CARE_PROVIDER_SITE_OTHER): Payer: Self-pay | Admitting: Internal Medicine

## 2019-02-05 ENCOUNTER — Other Ambulatory Visit: Payer: Self-pay

## 2019-02-05 VITALS — Ht 63.0 in | Wt 184.0 lb

## 2019-02-05 DIAGNOSIS — K7460 Unspecified cirrhosis of liver: Secondary | ICD-10-CM | POA: Diagnosis not present

## 2019-02-05 DIAGNOSIS — K625 Hemorrhage of anus and rectum: Secondary | ICD-10-CM | POA: Diagnosis not present

## 2019-02-05 DIAGNOSIS — D509 Iron deficiency anemia, unspecified: Secondary | ICD-10-CM

## 2019-02-05 NOTE — Patient Instructions (Signed)
Physician will call with results of blood test when completed. Next abdominal ultrasound in March 2021.

## 2019-02-05 NOTE — Progress Notes (Signed)
Virtual Visit via Telephone Note Patient had scheduled face-to-face visit today.  Because of Covid-19 pandemic it was decided to change the visit to virtual/telephone visit and we both agreed. I connected with Samantha [name] on 02/05/19 at  8:58 AM EST by telephone and verified that I am speaking with the correct person using two identifiers.  Location: Patient: home Provider: office   I discussed the limitations, risks, security and privacy concerns of performing an evaluation and management service by telephone and the availability of in person appointments. I also discussed with the patient that there may be a patient responsible charge related to this service. The patient expressed understanding and agreed to proceed.   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  She has history of iron deficiency anemia as well as rectal bleeding. She was deemed to be stable when she was last seen on 08/06/2018. She states she is still recovering from Covid infection that she acquired about 2 months ago.  She did not require hospitalization.  She remains with weakness.  She denies abdominal pain nausea vomiting but complains of rectal bleeding for the last 6 weeks.  She says bleeding occurs with bowel movements.  She generally has 1 stool daily.  She passes bright red blood.  While she has been feeling weak she does not feel dizzy or lightheaded.  She has not had any episode of confusion.  She reports that she has developed umbilical hernia. She is still driving.  She has not had any blood work recently. Her hemoglobin was 11.6 on 05/02/2018.   Current Outpatient Medications:  .  albuterol (PROVENTIL HFA;VENTOLIN HFA) 108 (90 BASE) MCG/ACT inhaler, Inhale 2 puffs into the lungs every 6 (six) hours as needed  for wheezing or shortness of breath. , Disp: , Rfl:  .  ezetimibe (ZETIA) 10 MG tablet, Take 10 mg by mouth at bedtime. , Disp: , Rfl:  .  FLUoxetine (PROZAC) 20 MG capsule, Take 20 mg by mouth daily. , Disp: , Rfl:  .  HYDROcodone-acetaminophen (NORCO) 10-325 MG tablet, Take 1 tablet by mouth as needed for pain., Disp: , Rfl:  .  isosorbide mononitrate (IMDUR) 60 MG 24 hr tablet, Take 1.5 tablets (90 mg total) by mouth daily., Disp: 135 tablet, Rfl: 3 .  loperamide (IMODIUM) 2 MG capsule, Take 2 mg by mouth as needed for diarrhea or loose stools., Disp: , Rfl:  .  metoprolol succinate (TOPROL XL) 25 MG 24 hr tablet, Take 1 tablet (25 mg total) by mouth daily., Disp: 90 tablet, Rfl: 3 .  multivitamin (VIT W/EXTRA C) CHEW chewable tablet, Chew 1 tablet by mouth., Disp: , Rfl:  .  NITROSTAT 0.4 MG SL tablet, Place 0.4 mg under the tongue every 5 (five) minutes as needed for chest pain (x 3 tabs daily). , Disp: , Rfl:  .  pantoprazole (PROTONIX) 40 MG tablet, Take 40 mg by mouth daily., Disp: , Rfl:  .  Pediatric Multivitamins-Iron (FLINTSTONES PLUS IRON PO), Take by mouth daily., Disp: , Rfl:  .  polyethylene glycol (MIRALAX / GLYCOLAX) packet, Take 17 g by mouth daily., Disp: , Rfl:  .  spironolactone (ALDACTONE) 25 MG tablet, Take 1 tablet (25 mg total) by mouth daily., Disp: 90 tablet, Rfl: 3 .  torsemide (DEMADEX) 20 MG tablet, Take 2 tablets (40  mg total) by mouth daily., Disp: 180 tablet, Rfl: 3 .  valsartan (DIOVAN) 160 MG tablet, Take 1 tablet (160 mg total) by mouth at bedtime., Disp: 30 tablet, Rfl: 11  Observations/Objective:  Patient reported her weight to be 184 pounds.  She states she weighs so self every day. Her weight in office was 202 pounds which means she has lost 18 pounds in the last 6 months. Patient speech appeared to be normal.  Ultrasound was on 11/08/2018 Irregular liver contour and increased echogenicity consistent with cirrhosis.  No evidence of focal  lesions. Splenomegaly but no evidence of ascites.  AFP was 2.9 on 05/02/2018.  Assessment and Plan:  #1.  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  Last ultrasound was about 3 months ago and negative for focal lesions.  Next one will be due in March 2021.  #2.  Rectal bleeding.  Suspect hemorrhoidal bleeding based on findings of last flexible sigmoidoscopy.  If bleeding continues or hemoglobin has dropped significantly would consider diagnostic colonoscopy.  #3.  History of iron deficiency anemia felt to be multifactorial.   Follow Up Instructions:  Patient will go to the lab for CBC, INR serum iron TIBC and AFP. Further recommendations to follow. Next ultrasound in March 2021. Office visit in 6 months.  I discussed the assessment and treatment plan with the patient. The patient was provided an opportunity to ask questions and all were answered. The patient agreed with the plan and demonstrated an understanding of the instructions.   The patient was advised to call back or seek an in-person evaluation if the symptoms worsen or if the condition fails to improve as anticipated.  I provided 10 minutes of non-face-to-face time during this encounter.   Hildred Laser, MD

## 2019-02-08 DIAGNOSIS — K7460 Unspecified cirrhosis of liver: Secondary | ICD-10-CM | POA: Diagnosis not present

## 2019-02-08 DIAGNOSIS — D509 Iron deficiency anemia, unspecified: Secondary | ICD-10-CM | POA: Diagnosis not present

## 2019-02-10 ENCOUNTER — Other Ambulatory Visit (INDEPENDENT_AMBULATORY_CARE_PROVIDER_SITE_OTHER): Payer: Self-pay | Admitting: Internal Medicine

## 2019-02-10 MED ORDER — HYDROCORTISONE ACETATE 25 MG RE SUPP: 25.0000 mg | Freq: Every day | RECTAL | 1 refills | Status: DC

## 2019-02-11 ENCOUNTER — Other Ambulatory Visit (INDEPENDENT_AMBULATORY_CARE_PROVIDER_SITE_OTHER): Payer: Self-pay | Admitting: *Deleted

## 2019-02-11 ENCOUNTER — Encounter (INDEPENDENT_AMBULATORY_CARE_PROVIDER_SITE_OTHER): Payer: Self-pay | Admitting: *Deleted

## 2019-02-11 DIAGNOSIS — K625 Hemorrhage of anus and rectum: Secondary | ICD-10-CM

## 2019-02-11 LAB — CBC
HCT: 40.2 % (ref 35.0–45.0)
Hemoglobin: 13 g/dL (ref 11.7–15.5)
MCH: 31.4 pg (ref 27.0–33.0)
MCHC: 32.3 g/dL (ref 32.0–36.0)
MCV: 97.1 fL (ref 80.0–100.0)
MPV: 10.9 fL (ref 7.5–12.5)
Platelets: 62 10*3/uL — ABNORMAL LOW (ref 140–400)
RBC: 4.14 10*6/uL (ref 3.80–5.10)
RDW: 13.1 % (ref 11.0–15.0)
WBC: 4.3 10*3/uL (ref 3.8–10.8)

## 2019-02-11 LAB — IRON, TOTAL/TOTAL IRON BINDING CAP
%SAT: 24 % (calc) (ref 16–45)
Iron: 64 ug/dL (ref 45–160)
TIBC: 262 mcg/dL (calc) (ref 250–450)

## 2019-02-11 LAB — PROTIME-INR
INR: 1.3 — ABNORMAL HIGH
Prothrombin Time: 13.4 s — ABNORMAL HIGH (ref 9.0–11.5)

## 2019-02-11 LAB — AFP TUMOR MARKER: AFP-Tumor Marker: 3.3 ng/mL

## 2019-02-12 NOTE — Patient Instructions (Signed)
Samantha [name]  02/12/2019     @PREFPERIOPPHARMACY @   Your procedure is scheduled on  02/15/2019 .  Report to [facility] at  0730  A.M.  Call this number if you have problems the morning of surgery:  [phone]   Remember:  Follow the diet and prep instructions given to you by Dr Olevia Perches office.                      Take these medicines the morning of surgery with A SIP OF WATER  Prozac, hydrocodone(if needed), isosorbide, metoprolol, protonix.    Do not wear jewelry, make-up or nail polish.  Do not wear lotions, powders, or perfumes. Please wear deodorant and brush your teeth.  Do not shave 48 hours prior to surgery.  Men may shave face and neck.  Do not bring valuables to the hospital.  [facility] is not responsible for any belongings or valuables.  Contacts, dentures or bridgework may not be worn into surgery.  Leave your suitcase in the car.  After surgery it may be brought to your room.  For patients admitted to the hospital, discharge time will be determined by your treatment team.  Patients discharged the day of surgery will not be allowed to drive home.   Name and phone number of your driver:   family Special instructions:  None  Please read over the following fact sheets that you were given. Anesthesia Post-op Instructions and Care and Recovery After Surgery       Colonoscopy, Adult, Care After This sheet gives you information about how to care for yourself after your procedure. Your health care provider may also give you more specific instructions. If you have problems or questions, contact your health care provider. What can I expect after the procedure? After the procedure, it is common to have:  A small amount of blood in your stool for 24 hours after the procedure.  Some gas.  Mild abdominal cramping or bloating. Follow these instructions at home: General instructions  For the first 24 hours after the procedure: ? Do not drive or use  machinery. ? Do not sign important documents. ? Do not drink alcohol. ? Do your regular daily activities at a slower pace than normal. ? Eat soft, easy-to-digest foods.  Take over-the-counter or prescription medicines only as told by your health care provider. Relieving cramping and bloating   Try walking around when you have cramps or feel bloated.  Apply heat to your abdomen as told by your health care provider. Use a heat source that your health care provider recommends, such as a moist heat pack or a heating pad. ? Place a towel between your skin and the heat source. ? Leave the heat on for 20-30 minutes. ? Remove the heat if your skin turns bright red. This is especially important if you are unable to feel pain, heat, or cold. You may have a greater risk of getting burned. Eating and drinking   Drink enough fluid to keep your urine pale yellow.  Resume your normal diet as instructed by your health care provider. Avoid heavy or fried foods that are hard to digest.  Avoid drinking alcohol for as long as instructed by your health care provider. Contact a health care provider if:  You have blood in your stool 2-3 days after the procedure. Get help right away if:  You have more than a small spotting of blood in your  stool.  You pass large blood clots in your stool.  Your abdomen is swollen.  You have nausea or vomiting.  You have a fever.  You have increasing abdominal pain that is not relieved with medicine. Summary  After the procedure, it is common to have a small amount of blood in your stool. You may also have mild abdominal cramping and bloating.  For the first 24 hours after the procedure, do not drive or use machinery, sign important documents, or drink alcohol.  Contact your health care provider if you have a lot of blood in your stool, nausea or vomiting, a fever, or increased abdominal pain. This information is not intended to replace advice given to you by  your health care provider. Make sure you discuss any questions you have with your health care provider. Document Released: 09/29/2003 Document Revised: 12/07/2016 Document Reviewed: 04/28/2015 Elsevier Patient Education  2020 [place] After These instructions provide you with information about caring for yourself after your procedure. Your health care provider may also give you more specific instructions. Your treatment has been planned according to current medical practices, but problems sometimes occur. Call your health care provider if you have any problems or questions after your procedure. What can I expect after the procedure? After your procedure, you may:  Feel sleepy for several hours.  Feel clumsy and have poor balance for several hours.  Feel forgetful about what happened after the procedure.  Have poor judgment for several hours.  Feel nauseous or vomit.  Have a sore throat if you had a breathing tube during the procedure. Follow these instructions at home: For at least 24 hours after the procedure:      Have a responsible adult stay with you. It is important to have someone help care for you until you are awake and alert.  Rest as needed.  Do not: ? Participate in activities in which you could fall or become injured. ? Drive. ? Use heavy machinery. ? Drink alcohol. ? Take sleeping pills or medicines that cause drowsiness. ? Make important decisions or sign legal documents. ? Take care of children on your own. Eating and drinking  Follow the diet that is recommended by your health care provider.  If you vomit, drink water, juice, or soup when you can drink without vomiting.  Make sure you have little or no nausea before eating solid foods. General instructions  Take over-the-counter and prescription medicines only as told by your health care provider.  If you have sleep apnea, surgery and certain medicines can increase  your risk for breathing problems. Follow instructions from your health care provider about wearing your sleep device: ? Anytime you are sleeping, including during daytime naps. ? While taking prescription pain medicines, sleeping medicines, or medicines that make you drowsy.  If you smoke, do not smoke without supervision.  Keep all follow-up visits as told by your health care provider. This is important. Contact a health care provider if:  You keep feeling nauseous or you keep vomiting.  You feel light-headed.  You develop a rash.  You have a fever. Get help right away if:  You have trouble breathing. Summary  For several hours after your procedure, you may feel sleepy and have poor judgment.  Have a responsible adult stay with you for at least 24 hours or until you are awake and alert. This information is not intended to replace advice given to you by your health care provider. Make  sure you discuss any questions you have with your health care provider. Document Released: 06/07/2015 Document Revised: 05/15/2017 Document Reviewed: 06/07/2015 Elsevier Patient Education  2020 [employer].

## 2019-02-13 ENCOUNTER — Encounter (HOSPITAL_COMMUNITY)
Admission: RE | Admit: 2019-02-13 | Discharge: 2019-02-13 | Disposition: A | Discharge status: Home / Self Care | Payer: Medicare Other | Source: Ambulatory Visit | Attending: Internal Medicine | Admitting: Internal Medicine

## 2019-02-13 ENCOUNTER — Other Ambulatory Visit (HOSPITAL_COMMUNITY)
Admission: RE | Admit: 2019-02-13 | Discharge: 2019-02-13 | Disposition: A | Discharge status: Home / Self Care | Payer: Medicare Other | Source: Ambulatory Visit | Attending: Internal Medicine | Admitting: Internal Medicine

## 2019-02-13 ENCOUNTER — Other Ambulatory Visit: Payer: Self-pay

## 2019-02-13 DIAGNOSIS — U071 COVID-19: Secondary | ICD-10-CM | POA: Insufficient documentation

## 2019-02-13 DIAGNOSIS — Z01812 Encounter for preprocedural laboratory examination: Secondary | ICD-10-CM | POA: Diagnosis not present

## 2019-02-13 LAB — SARS CORONAVIRUS 2 (TAT 6-24 HRS): SARS Coronavirus 2: POSITIVE — AB

## 2019-02-14 ENCOUNTER — Encounter (INDEPENDENT_AMBULATORY_CARE_PROVIDER_SITE_OTHER): Payer: Self-pay | Admitting: *Deleted

## 2019-02-14 ENCOUNTER — Telehealth (INDEPENDENT_AMBULATORY_CARE_PROVIDER_SITE_OTHER): Payer: Self-pay | Admitting: *Deleted

## 2019-02-14 ENCOUNTER — Telehealth: Payer: Self-pay | Admitting: Nurse Practitioner

## 2019-02-14 ENCOUNTER — Other Ambulatory Visit (INDEPENDENT_AMBULATORY_CARE_PROVIDER_SITE_OTHER): Payer: Self-pay | Admitting: *Deleted

## 2019-02-14 NOTE — Telephone Encounter (Signed)
Called to Discuss with patient about Covid symptoms and the use of bamlanivimab, a monoclonal antibody infusion for those with mild to moderate Covid symptoms and at a high risk of hospitalization.     Pt is qualified for this infusion at the [facility] infusion center due to co-morbid conditions and/or a member of an at-risk group.    Patient Active Problem List   Diagnosis Date Noted  . Iron deficiency anemia 02/05/2019  . Chronic combined systolic and diastolic CHF (congestive heart failure) ([place]) 04/17/2018  . Heart failure ([place]) 04/17/2018  . Osteoarthritis of spine with radiculopathy, cervical region 01/12/2018  . Paresthesia of both hands 12/14/2017  . Recurrent falls 12/14/2017  . Closed compression fracture of first lumbar vertebra ([place]) 08/23/2017  . Lumbar spondylosis with myelopathy 08/23/2017  . Carpal tunnel syndrome of right wrist 04/05/2017  . Rectal pain 03/06/2017  . Influenza A (H1N1) 02/22/2017  . Rectal bleeding 02/22/2017  . Closed fracture of lower end of right radius with routine healing 02/20/2017  . Injury of triangular fibrocartilage complex of right wrist 02/20/2017  . Pain 02/10/2017  . Other cirrhosis of liver ([place]) 10/26/2016  . Persistent atrial fibrillation 08/16/2016  . Cardiomyopathy, ischemic-35-40% by cath  01/11/2015  . Obesity 09/24/2013  . Unspecified hereditary and idiopathic peripheral neuropathy 01/10/2013  . Hereditary and idiopathic peripheral neuropathy 01/10/2013  . Cirrhosis, non-alcoholic ([place]) 72/90/2111  . Esophageal varices- Plavix stopped 12/23/2011  . GI bleed 12/23/2011  . Idiopathic esophageal varices without bleeding ([place]) 12/23/2011  . Anemia 04/22/2011  . CAD (coronary artery disease) 08/21/2009  . Mixed hyperlipidemia 06/04/2008  . Essential hypertension 06/04/2008  . S/P CABG x 3- 2001 06/04/2008      Patient denies any symptoms.

## 2019-02-14 NOTE — Telephone Encounter (Signed)
Patient has been called and made aware that her procedure for 02/15/2019 will be cancelled due to her testing positive for CoVid 19.  She was advised to call her PCP this morning.  Patient did state that she had tested positive back in September and at that time she was really sick. She quarantined 5 weeks.  At this time she states that she is asymptomatic , other than some diarrhea which she feels is caused by a medication.

## 2019-02-15 DIAGNOSIS — K625 Hemorrhage of anus and rectum: Secondary | ICD-10-CM | POA: Insufficient documentation

## 2019-03-05 ENCOUNTER — Encounter (HOSPITAL_COMMUNITY)
Admission: RE | Admit: 2019-03-05 | Discharge: 2019-03-05 | Disposition: A | Discharge status: Home / Self Care | Payer: Medicare Other | Source: Ambulatory Visit | Attending: Internal Medicine | Admitting: Internal Medicine

## 2019-03-05 ENCOUNTER — Other Ambulatory Visit: Payer: Self-pay

## 2019-03-06 ENCOUNTER — Other Ambulatory Visit: Payer: Self-pay | Admitting: *Deleted

## 2019-03-06 NOTE — Patient Outreach (Signed)
[employer] [facility]) Care Management  03/06/2019  Samantha [name] 1950/06/03 [id number]   RN Health Coach Every Other Month Outreach  Referral Date:08/20/2018 Referral Source:[employer] High Risk List Reason for Referral:Assess Needs Insurance:[employer] Medicare   Outreach Attempt:  Outreach attempt #1 to patient for follow up. No answer. RN Health Coach left HIPAA compliant voicemail message along with contact information.  Plan:  RN Health Coach will make another outreach attempt within the month of February if no return call back from message left.  [place] [phone] [email or web address] [name]

## 2019-03-08 ENCOUNTER — Encounter (HOSPITAL_COMMUNITY): Admission: RE | Payer: Self-pay | Source: Home / Self Care

## 2019-03-08 ENCOUNTER — Ambulatory Visit (HOSPITAL_COMMUNITY): Admission: RE | Admit: 2019-03-08 | Payer: Medicare Other | Source: Home / Self Care | Admitting: Internal Medicine

## 2019-03-08 SURGERY — COLONOSCOPY WITH PROPOFOL
Anesthesia: Monitor Anesthesia Care

## 2019-03-20 DIAGNOSIS — E1140 Type 2 diabetes mellitus with diabetic neuropathy, unspecified: Secondary | ICD-10-CM | POA: Diagnosis not present

## 2019-03-20 DIAGNOSIS — E782 Mixed hyperlipidemia: Secondary | ICD-10-CM | POA: Diagnosis not present

## 2019-03-20 DIAGNOSIS — E7800 Pure hypercholesterolemia, unspecified: Secondary | ICD-10-CM | POA: Diagnosis not present

## 2019-03-20 DIAGNOSIS — K2100 Gastro-esophageal reflux disease with esophagitis, without bleeding: Secondary | ICD-10-CM | POA: Diagnosis not present

## 2019-03-20 DIAGNOSIS — J441 Chronic obstructive pulmonary disease with (acute) exacerbation: Secondary | ICD-10-CM | POA: Diagnosis not present

## 2019-03-20 DIAGNOSIS — N183 Chronic kidney disease, stage 3 unspecified: Secondary | ICD-10-CM | POA: Diagnosis not present

## 2019-03-25 ENCOUNTER — Encounter (INDEPENDENT_AMBULATORY_CARE_PROVIDER_SITE_OTHER): Payer: Self-pay | Admitting: Ophthalmology

## 2019-03-25 ENCOUNTER — Ambulatory Visit (INDEPENDENT_AMBULATORY_CARE_PROVIDER_SITE_OTHER): Payer: Medicare Other | Admitting: Ophthalmology

## 2019-03-25 DIAGNOSIS — H43393 Other vitreous opacities, bilateral: Secondary | ICD-10-CM

## 2019-03-25 DIAGNOSIS — H25813 Combined forms of age-related cataract, bilateral: Secondary | ICD-10-CM

## 2019-03-25 DIAGNOSIS — H35033 Hypertensive retinopathy, bilateral: Secondary | ICD-10-CM

## 2019-03-25 DIAGNOSIS — H3581 Retinal edema: Secondary | ICD-10-CM

## 2019-03-25 DIAGNOSIS — I10 Essential (primary) hypertension: Secondary | ICD-10-CM | POA: Diagnosis not present

## 2019-03-25 DIAGNOSIS — H3563 Retinal hemorrhage, bilateral: Secondary | ICD-10-CM | POA: Diagnosis not present

## 2019-03-25 DIAGNOSIS — H43811 Vitreous degeneration, right eye: Secondary | ICD-10-CM

## 2019-03-25 NOTE — Progress Notes (Signed)
[facility] Note  03/25/2019  CHIEF COMPLAINT Patient presents for Retina Follow Up  HISTORY OF PRESENT ILLNESS: Samantha [name] is a 69 y.o. female who presents to the clinic today for:   HPI    Retina Follow Up    Patient presents with  PVD.  In right eye.  This started months ago.  Severity is moderate.  Duration of months.  Since onset it is gradually worsening.  I, the attending physician,  performed the HPI with the patient and updated documentation appropriately.          Comments    Pt states her vision is getting worse and believes it is time to have cataract surgery--patient states she does not have an established general Ophthalmologist.  Patient denies eye pain.  She has discomfort from itching and watering OU.  Patient denies any new or worsening floaters or fol OU.       Last edited by Bernarda Caffey, MD on 03/25/2019  8:48 AM. (History)    pt states the floaters in her right eye are still there, but the ones in her left eye are a lot better, she denies fol. Patient states she wants to see about cataract surgery, due to vision blurry OU.    Referring physician: [name], MD [place],  [place] [address]  HISTORICAL INFORMATION:   Selected notes from the MEDICAL RECORD NUMBER Referred by Dr. Leticia Clas for concern of new floaters OU and retinal heme OS   CURRENT MEDICATIONS: No current outpatient medications on file. (Ophthalmic Drugs)   No current facility-administered medications for this visit. (Ophthalmic Drugs)   Current Outpatient Medications (Other)  Medication Sig  . albuterol (PROVENTIL HFA;VENTOLIN HFA) 108 (90 BASE) MCG/ACT inhaler Inhale 2 puffs into the lungs every 6 (six) hours as needed for wheezing or shortness of breath.   . ezetimibe (ZETIA) 10 MG tablet Take 10 mg by mouth at bedtime.   [name] FLUoxetine (PROZAC) 20 MG capsule Take 20 mg by mouth daily.   [name] HYDROcodone-acetaminophen (NORCO) 10-325 MG tablet  Take 1 tablet by mouth every 6 (six) hours as needed for moderate pain.   . hydrocortisone (ANUSOL-HC) 25 MG suppository Place 1 suppository (25 mg total) rectally at bedtime.  . isosorbide mononitrate (IMDUR) 60 MG 24 hr tablet Take 1.5 tablets (90 mg total) by mouth daily.  . metoprolol succinate (TOPROL XL) 25 MG 24 hr tablet Take 1 tablet (25 mg total) by mouth daily.  [name] NITROSTAT 0.4 MG SL tablet Place 0.4 mg under the tongue every 5 (five) minutes as needed for chest pain (x 3 tabs daily).   . pantoprazole (PROTONIX) 40 MG tablet Take 40 mg by mouth daily.  . Pediatric Multivitamins-Iron (FLINTSTONES PLUS IRON PO) Take by mouth daily.  . polyethylene glycol (MIRALAX / GLYCOLAX) packet Take 17 g by mouth daily.  [name] spironolactone (ALDACTONE) 25 MG tablet Take 1 tablet (25 mg total) by mouth daily.  [name] torsemide (DEMADEX) 20 MG tablet Take 2 tablets (40 mg total) by mouth daily.  . valsartan (DIOVAN) 160 MG tablet Take 1 tablet (160 mg total) by mouth at bedtime.   No current facility-administered medications for this visit. (Other)   REVIEW OF SYSTEMS: ROS    Positive for: Gastrointestinal, Musculoskeletal, Cardiovascular, Eyes   Negative for: Constitutional, Neurological, Skin, Genitourinary, HENT, Endocrine, Respiratory, Psychiatric, Allergic/Imm, Heme/Lymph   Last edited by Doneen Poisson on 03/25/2019  8:34 AM. (  History)     ALLERGIES Allergies  Allergen Reactions  . Entresto [Sacubitril-Valsartan] Swelling    Swelling of eyes  . Acetaminophen Other (See Comments)    Liver problems  . Oxycodone Itching and Nausea Only  . Ace Inhibitors Other (See Comments)    UNSURE OF REACTION TYPE   . Cefaclor Itching and Rash  . Cephalexin Itching and Rash  . Penicillins Rash    Has patient had a PCN reaction causing immediate rash, facial/tongue/throat swelling, SOB or lightheadedness with hypotension: YES Has patient had a PCN reaction causing severe rash involving mucus membranes or  skin necrosis: NO Has patient had a PCN reaction that required hospitalization: YES Has patient had a PCN reaction occurring within the last 10 years: NO If all of the above answers are "NO", then may proceed with Cephalosporin use.   . Pregabalin Other (See Comments)    Retains fluid  . Tape Itching and Rash    Takes skin right off with medical tape--PAPER TAPE ONLY    PAST MEDICAL HISTORY Past Medical History:  Diagnosis Date  . Cataract    OU  . Chronic combined systolic and diastolic CHF (congestive heart failure) ([place])   . Cirrhosis of liver ([place])   . CKD (chronic kidney disease), stage III   . Coronary artery disease    a. s/p CABG 2001 w/ (LIMA-OM, SVG-D1, SVG-RCA). b. h/o multiple PCIs per Dr. Antionette Char note.  . Depression   . Esophageal varices ([place])    New 2013  . Gastroesophageal reflux disease   . History of pneumonia   . Hyperlipidemia   . Hypertension   . Hypertensive retinopathy    OU  . Obesity   . Osteoarthritis   . Pancytopenia ([place])   . Paroxysmal atrial flutter ([place])    a. dx 05/2016.  [name] Persistent atrial fibrillation ([place])    a. reported in hosp 07/2016, not on anticoag due to cirrhosis and liver disease, low platelets, varices.  . Thrombocytopenia ([place])    Past Surgical History:  Procedure Laterality Date  . ABDOMINAL HYSTERECTOMY    . BACK SURGERY    . CARDIAC CATHETERIZATION  2004   left internal mammary artery to the obtuse marginal  was found to be small and thread like.  The two grafts were patent.  The left circumflex had 90% in-stent restenosis and cutting balloon angioplasty was performed followed by placement of a 3.0 x 56m Taxus drug -eluting stent.    .Marland KitchenCARDIAC CATHETERIZATION  2006   There was in-stent restenosis in the left circumflex and this was treated with cutting balloon angioplasty   . CARDIAC CATHETERIZATION  2008   vein graft to the to the obtuse marginal was patent, although small, left circumflex had 40% in-stent restenosis,  ejection fraction 40-45%.  The patient was medically mananged.  .Marland KitchenCARDIAC CATHETERIZATION N/A 01/09/2015   Procedure: Left Heart Cath and Cors/Grafts Angiography;  Surgeon: HBelva Crome MD;  [facility];  Service: Cardiovascular;  Laterality: N/A;  . COLONOSCOPY  03/29/2011   Procedure: COLONOSCOPY;  Surgeon: MJamesetta So MD;  [facility];  Service: Gastroenterology;  Laterality: N/A;  . CORONARY ARTERY BYPASS GRAFT  May 31,2001   x 3 with a vein graft to the first diagonal, vein graft to the right coronary  artery, and a free left internal mammary  artery to the obtuse marginal   . ESOPHAGEAL BANDING N/A 07/04/2012   Procedure: ESOPHAGEAL BANDING;  Surgeon: NBernadene Person  Gloriann Loan, MD;  [facility];  Service: Endoscopy;  Laterality: N/A;  . ESOPHAGEAL BANDING N/A 09/17/2012   Procedure: ESOPHAGEAL BANDING;  Surgeon: Rogene Houston, MD;  [facility];  Service: Endoscopy;  Laterality: N/A;  . ESOPHAGEAL BANDING N/A 10/22/2013   Procedure: ESOPHAGEAL BANDING;  Surgeon: Rogene Houston, MD;  [facility];  Service: Endoscopy;  Laterality: N/A;  . ESOPHAGEAL BANDING N/A 11/28/2014   Procedure: ESOPHAGEAL BANDING;  Surgeon: Rogene Houston, MD;  [facility];  Service: Endoscopy;  Laterality: N/A;  . ESOPHAGEAL BANDING N/A 10/29/2015   Procedure: ESOPHAGEAL BANDING;  Surgeon: Rogene Houston, MD;  [facility];  Service: Endoscopy;  Laterality: N/A;  . ESOPHAGOGASTRODUODENOSCOPY  12/20/2011   Procedure: ESOPHAGOGASTRODUODENOSCOPY (EGD);  Surgeon: Jamesetta So, MD;  [facility];  Service: Gastroenterology;  Laterality: N/A;  . ESOPHAGOGASTRODUODENOSCOPY N/A 07/04/2012   Procedure: ESOPHAGOGASTRODUODENOSCOPY (EGD);  Surgeon: Rogene Houston, MD;  [facility];  Service: Endoscopy;  Laterality: N/A;  235-moved to 255 Ann to notify pt  . ESOPHAGOGASTRODUODENOSCOPY N/A 09/17/2012   Procedure:  ESOPHAGOGASTRODUODENOSCOPY (EGD);  Surgeon: Rogene Houston, MD;  [facility];  Service: Endoscopy;  Laterality: N/A;  730  . ESOPHAGOGASTRODUODENOSCOPY N/A 10/22/2013   Procedure: ESOPHAGOGASTRODUODENOSCOPY (EGD);  Surgeon: Rogene Houston, MD;  [facility];  Service: Endoscopy;  Laterality: N/A;  730  . ESOPHAGOGASTRODUODENOSCOPY N/A 11/28/2014   Procedure: ESOPHAGOGASTRODUODENOSCOPY (EGD);  Surgeon: Rogene Houston, MD;  [facility];  Service: Endoscopy;  Laterality: N/A;  1:25  . ESOPHAGOGASTRODUODENOSCOPY N/A 10/29/2015   Procedure: ESOPHAGOGASTRODUODENOSCOPY (EGD);  Surgeon: Rogene Houston, MD;  [facility];  Service: Endoscopy;  Laterality: N/A;  12:00  . ESOPHAGOGASTRODUODENOSCOPY N/A 12/12/2016   Procedure: ESOPHAGOGASTRODUODENOSCOPY (EGD);  Surgeon: Rogene Houston, MD;  [facility];  Service: Endoscopy;  Laterality: N/A;  225  . FLEXIBLE SIGMOIDOSCOPY N/A 03/20/2017   Procedure: FLEXIBLE SIGMOIDOSCOPY;  Surgeon: Rogene Houston, MD;  [facility];  Service: Endoscopy;  Laterality: N/A;  7:30  . JOINT REPLACEMENT    . LEFT HEART CATH AND CORS/GRAFTS ANGIOGRAPHY N/A 08/15/2016   Procedure: Left Heart Cath and Cors/Grafts Angiography;  Surgeon: Jettie Booze, MD;  Location: [facility];  Service: Cardiovascular;  Laterality: N/A;  . LEFT HEART CATHETERIZATION WITH CORONARY/GRAFT ANGIOGRAM N/A 12/25/2013   Procedure: LEFT HEART CATHETERIZATION WITH Beatrix Fetters;  Surgeon: Blane Ohara, MD;  Location[facility];  Service: Cardiovascular;  Laterality: N/A;  . RIGHT HEART CATH N/A 05/29/2017   Procedure: RIGHT HEART CATH;  Surgeon: Jolaine Artist, MD;  Location: [facility];  Service: Cardiovascular;  Laterality: N/A;  . rotator cuff left  2007  . TONSILLECTOMY      FAMILY HISTORY Family History  Problem Relation Age of Onset  . Heart attack Mother   . Diabetes Mother   .  Heart attack Father 15       cause of death  . Diabetes Father   . Coronary artery disease Sister        CABG   . Diabetes Sister   . Glaucoma Sister   . Diabetes Brother   . Colon cancer Neg Hx     SOCIAL HISTORY Social History   Tobacco Use  . Smoking status: Former Smoker    Packs/day: 0.50    Years: 20.00    Pack years:  10.00    Types: Cigarettes    Quit date: 07/21/1995    Years since quitting: 23.6  . Smokeless tobacco: Never Used  Substance Use Topics  . Alcohol use: No  . Drug use: No    OPHTHALMIC EXAM:  Base Eye Exam    Visual Acuity (Snellen - Linear)      Right Left   Dist [place] 20/70 -2 20/30 -2   Dist ph [place] 20/30 20/25 -2   Correction: Glasses       Tonometry (Tonopen, 8:32 AM)      Right Left   Pressure 13 12       Pupils      Dark Light Shape React APD   Right 3 2 Round Minimal 0   Left 3 2 Round Minimal 0       Visual Fields      Left Right    Full Full       Extraocular Movement      Right Left    Full Full       Neuro/Psych    Oriented x3: Yes   Mood/Affect: Normal       Dilation    Both eyes: 1.0% Mydriacyl, 2.5% Phenylephrine @ 8:32 AM        Slit Lamp and Fundus Exam    Slit Lamp Exam      Right Left   Lids/Lashes Dermatochalasis - upper lid Dermatochalasis - upper lid   Conjunctiva/Sclera White and quiet White and quiet   Cornea Mild Arcus, 1-2+ Punctate epithelial erosions, mild Debris in tear film Mild Arcus, 1-2+ Punctate epithelial erosions, mild Debris in tear film   Anterior Chamber deep and clear deep and clear   Iris Round and dilated Round and dilated   Lens 3+ Nuclear sclerosis with early burnescense, 2-3+ Cortical cataract 2-3+ Nuclear sclerosis with early brunescense, 2+ Cortical cataract   Vitreous Vitreous syneresis, no pigment, Posterior vitreous detachment, scattered vitreous condensations, Weiss ring Vitreous syneresis, no pigment, vitreous condensations       Fundus Exam      Right Left   Disc Pink  and Sharp, NFL whitening at 1200, ? mylenation Pink and Sharp   C/D Ratio 0.3 0.5   Macula Flat, Blunted foveal reflex, Retinal pigment epithelial mottling, No heme or edema Flat, Blunted foveal reflex, Retinal pigment epithelial mottling, No heme or edema   Vessels mild Vascular attenuation, Tortuous, mild A/V crossing changes mild Vascular attenuation, Tortuous   Periphery Attached, No RT/RD, focal blot heme at 1130 Attached, focal blot heme temporal periphery        Refraction    Wearing Rx      Sphere Cylinder Axis Add   Right -0.75 +1.25 177 +2.50   Left +0.25 +0.75 148 +2.50   Type: PAL          IMAGING AND PROCEDURES  Imaging and Procedures for @TODAY @  OCT, Retina - OU - Both Eyes       Right Eye Quality was good. Central Foveal Thickness: 235. Progression has been stable. Findings include normal foveal contour, no SRF, intraretinal fluid (+cystic changes -- stable from prior, interval resolution of vitreous opacitites).   Left Eye Quality was good. Central Foveal Thickness: 253. Progression has been stable. Findings include normal foveal contour, no IRF, no SRF, vitreomacular adhesion .   Notes *Images captured and stored on drive  Diagnosis / Impression:  NFP, no IRF/SRF OU OD: tr cystic changes -- stable from prior, interval  resolution of vitreous opacities  Clinical management:  See below  Abbreviations: NFP - Normal foveal profile. CME - cystoid macular edema. PED - pigment epithelial detachment. IRF - intraretinal fluid. SRF - subretinal fluid. EZ - ellipsoid zone. ERM - epiretinal membrane. ORA - outer retinal atrophy. ORT - outer retinal tubulation. SRHM - subretinal hyper-reflective material             ASSESSMENT/PLAN:    ICD-10-CM   1. Posterior vitreous detachment of right eye  H43.811   2. Vitreous syneresis of both eyes  H43.393   3. Retinal hemorrhage of both eyes  H35.63   4. Retinal edema  H35.81 OCT, Retina - OU - Both Eyes  5.  Essential hypertension  I10   6. Hypertensive retinopathy of both eyes  H35.033   7. Combined forms of age-related cataract of both eyes  H25.813     1,2. PVD / vitreous syneresis OU  - symptomatic floaters OU -- OD with frank PVD, OS with posterior hyaloid intact on OCT but +vitreous condensations on exam  - symptoms improving  - Discussed findings and prognosis  - No RT or RD on 360 scleral depressed exam OD  - Reviewed s/s of RT/RD  - Strict return precautions for any such RT/RD signs/symptoms  - 6 months, DFE, OCT  3. Retinal Hemorrhage OU  - focal blot heme at 1130 OD, 1 blot heme temporally OS, likely related to hypertensive retinopathy  - no RT/RD associated  - not visually significant at this time - recommend monitoring  - f/u 6 mos  4. No retinal edema on exam or OCT  5,6. Hypertensive retinopathy OU  - discussed importance of tight BP control  - monitor  7. Mixed form age related cataract OU  - The symptoms of cataract, surgical options, and treatments and risks were discussed with patient.  - discussed diagnosis and progression  - OD approaching vision significance  - pt expresses interest in cataract referral  - refer to Dr. Doreatha Lew for cat eval  Return in about 6 months (around 09/22/2019) for DFE, OCT.  There are no Patient Instructions on file for this visit.  This document serves as a record of services personally performed by Gardiner Sleeper, MD, PhD. It was created on their behalf by Roselee Nova, COMT. The creation of this record is the provider's dictation and/or activities during the visit.  Electronically signed by: Roselee Nova, COMT 03/25/19 4:37 PM  Gardiner Sleeper, M.D., Ph.D. Diseases & Surgery of the Retina and [place] 03/25/2019   I have reviewed the above documentation for accuracy and completeness, and I agree with the above. Gardiner Sleeper, M.D., Ph.D. 03/25/19 4:37 PM   Abbreviations: M myopia  (nearsighted); A astigmatism; H hyperopia (farsighted); P presbyopia; Mrx spectacle prescription;  CTL contact lenses; OD right eye; OS left eye; OU both eyes  XT exotropia; ET esotropia; PEK punctate epithelial keratitis; PEE punctate epithelial erosions; DES dry eye syndrome; MGD meibomian gland dysfunction; ATs artificial tears; PFAT's preservative free artificial tears; [place] nuclear sclerotic cataract; PSC posterior subcapsular cataract; ERM epi-retinal membrane; PVD posterior vitreous detachment; RD retinal detachment; DM diabetes mellitus; DR diabetic retinopathy; NPDR non-proliferative diabetic retinopathy; PDR proliferative diabetic retinopathy; CSME clinically significant macular edema; DME diabetic macular edema; dbh dot blot hemorrhages; CWS cotton wool spot; POAG primary open angle glaucoma; C/D cup-to-disc ratio; HVF humphrey visual field; GVF goldmann visual field; OCT optical coherence tomography; IOP intraocular pressure; BRVO  Branch retinal vein occlusion; CRVO central retinal vein occlusion; CRAO central retinal artery occlusion; BRAO branch retinal artery occlusion; RT retinal tear; SB scleral buckle; PPV pars plana vitrectomy; VH Vitreous hemorrhage; PRP panretinal laser photocoagulation; IVK intravitreal kenalog; VMT vitreomacular traction; MH Macular hole;  NVD neovascularization of the disc; NVE neovascularization elsewhere; AREDS age related eye disease study; ARMD age related macular degeneration; POAG primary open angle glaucoma; EBMD epithelial/anterior basement membrane dystrophy; ACIOL anterior chamber intraocular lens; IOL intraocular lens; PCIOL posterior chamber intraocular lens; Phaco/IOL phacoemulsification with intraocular lens placement; [place] photorefractive keratectomy; LASIK laser assisted in situ keratomileusis; HTN hypertension; DM diabetes mellitus; COPD chronic obstructive pulmonary disease

## 2019-03-27 DIAGNOSIS — Z23 Encounter for immunization: Secondary | ICD-10-CM | POA: Diagnosis not present

## 2019-03-27 DIAGNOSIS — I10 Essential (primary) hypertension: Secondary | ICD-10-CM | POA: Diagnosis not present

## 2019-03-27 DIAGNOSIS — E119 Type 2 diabetes mellitus without complications: Secondary | ICD-10-CM | POA: Diagnosis not present

## 2019-03-27 DIAGNOSIS — D72819 Decreased white blood cell count, unspecified: Secondary | ICD-10-CM | POA: Diagnosis not present

## 2019-03-27 DIAGNOSIS — R4582 Worries: Secondary | ICD-10-CM | POA: Diagnosis not present

## 2019-03-27 DIAGNOSIS — D696 Thrombocytopenia, unspecified: Secondary | ICD-10-CM | POA: Diagnosis not present

## 2019-03-28 ENCOUNTER — Encounter (INDEPENDENT_AMBULATORY_CARE_PROVIDER_SITE_OTHER): Payer: Medicare Other | Admitting: Ophthalmology

## 2019-03-29 DIAGNOSIS — E7849 Other hyperlipidemia: Secondary | ICD-10-CM | POA: Diagnosis not present

## 2019-03-29 DIAGNOSIS — I10 Essential (primary) hypertension: Secondary | ICD-10-CM | POA: Diagnosis not present

## 2019-04-07 IMAGING — CR DG CHEST 2V
2 series · 2 of 2 positions shown · non-contrast
Comparison: 09/12/2007, 05/24/2017

CLINICAL DATA: 67-year-old female with a history chest pain and
shortness of breath

EXAM:
CHEST - 2 VIEW

[chest pa]
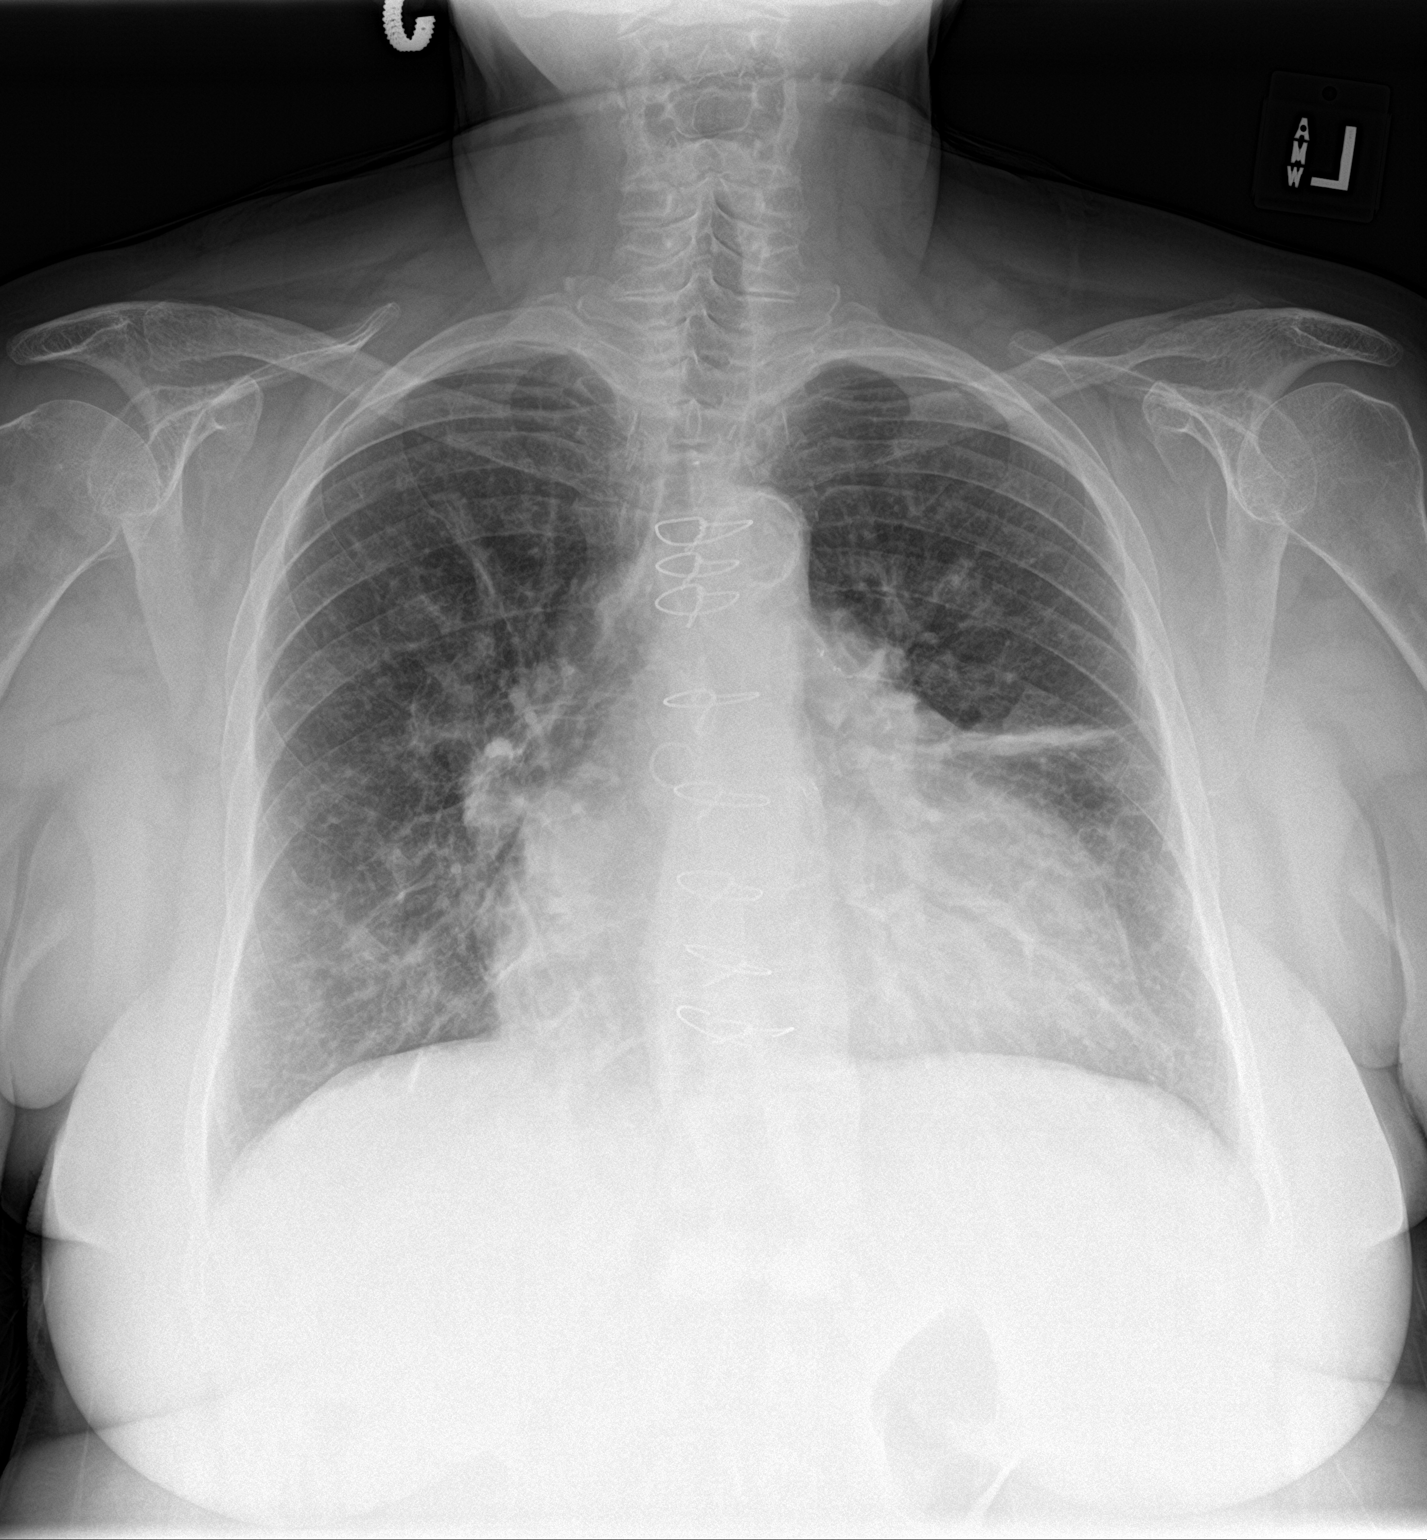

[chest lat]
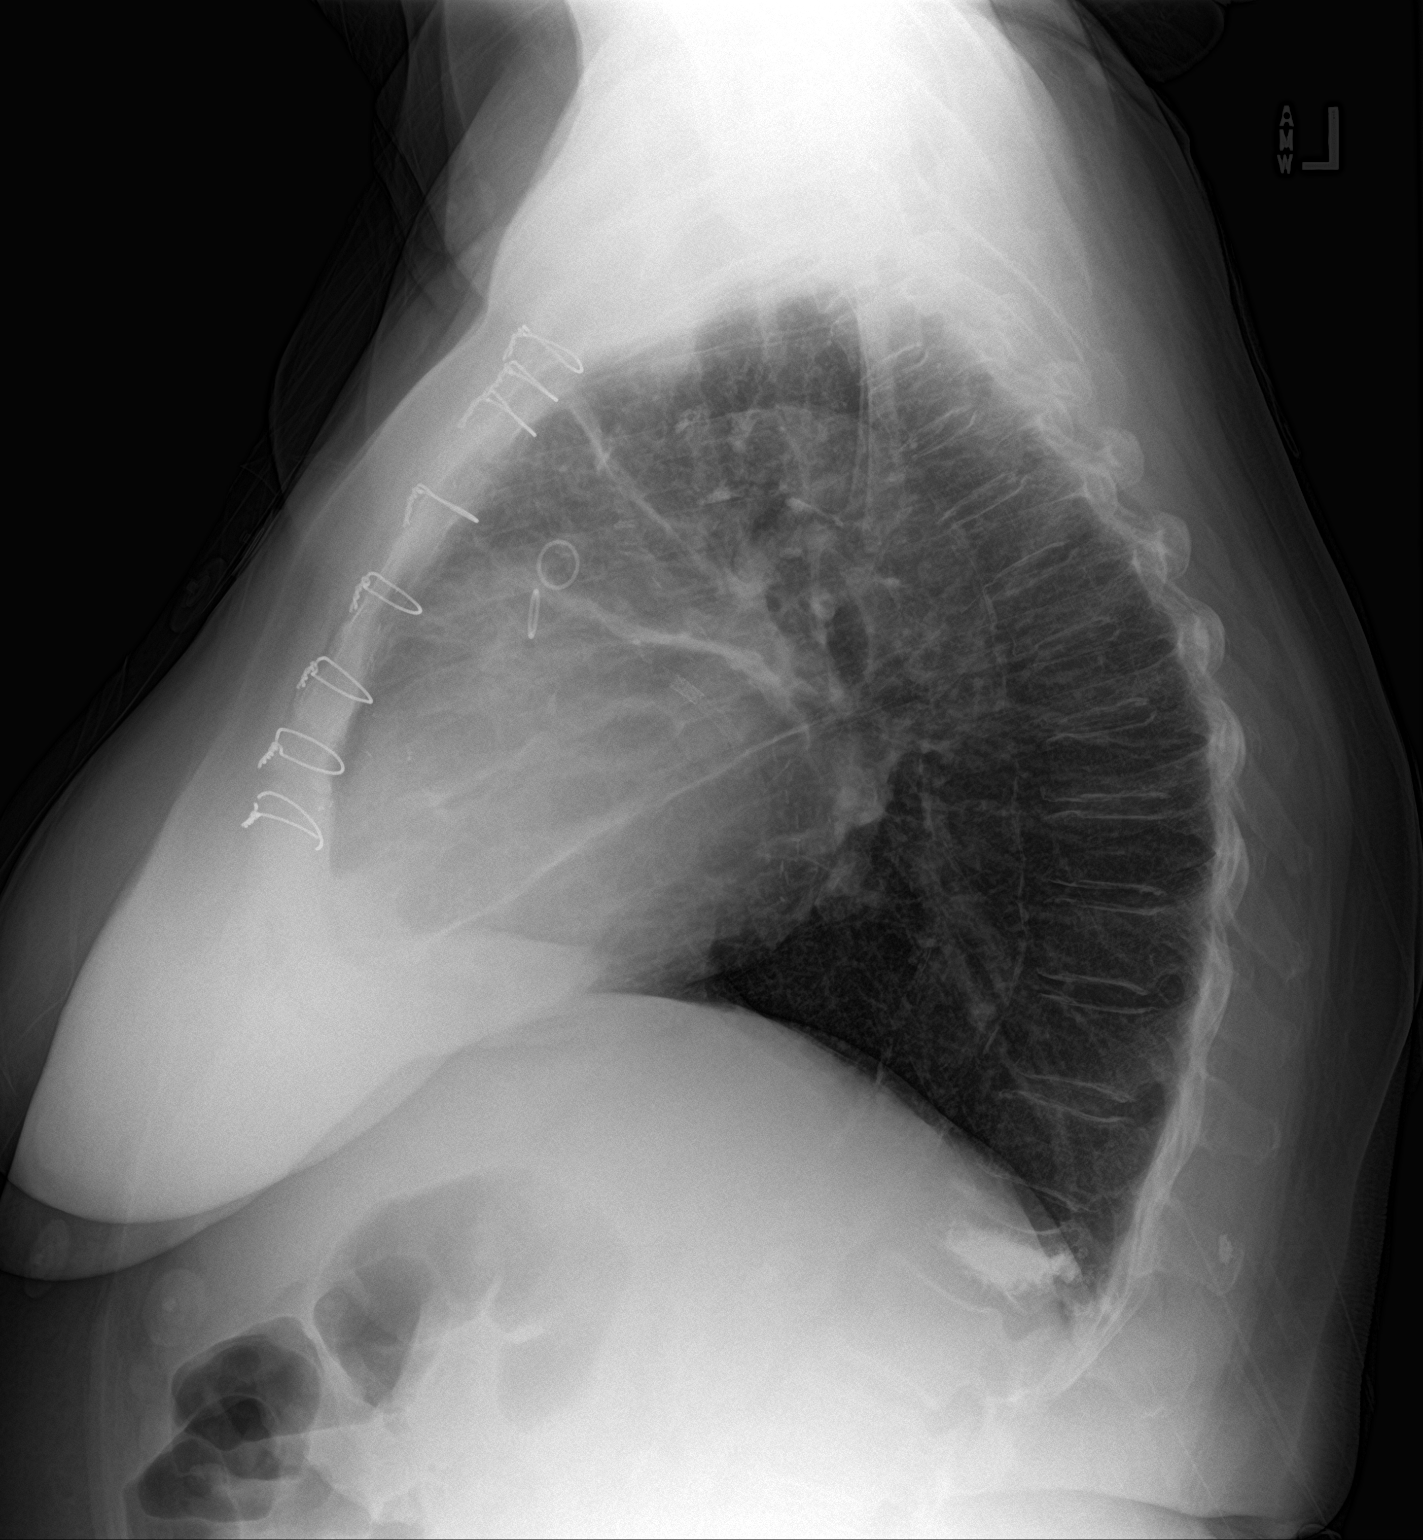

[2 of 2 positions shown; findings below may reference images not displayed]

FINDINGS: Cardiomediastinal silhouette unchanged in size and contour.

Surgical changes of median sternotomy and CABG.

Interstitial opacities of the lungs. Redemonstration linear opacity
left lung. No pneumothorax or pleural effusion. No new confluent
airspace disease.

Osteopenia with no displaced fracture. Changes of prior augmentation
lower thoracic vertebral body
IMPRESSION: Chronic lung changes without evidence of acute cardiopulmonary
disease.

Cardiomegaly with surgical changes of median sternotomy and CABG.

## 2019-04-10 ENCOUNTER — Other Ambulatory Visit: Payer: Self-pay | Admitting: *Deleted

## 2019-04-10 NOTE — Patient Outreach (Signed)
Samantha [name] [facility]) Care Management  04/10/2019  [name] May 24, 1950 [id number]   RN Health CoachEvery Other MonthOutreach  Referral Date:08/20/2018 Referral Source:[employer] High Risk List Reason for Referral:Assess Needs Insurance:[employer] Medicare   Outreach Attempt:  Outreach attempt #2 to patient for follow up. No answer. RN Health Coach left HIPAA compliant voicemail message along with contact information.  Plan:  RN Health Coach will make another outreach attempt within the month of March if no return call back from patient.  [place] [phone] [name]

## 2019-04-13 IMAGING — CR DG CHEST 2V
2 series · 2 of 2 positions shown · non-contrast
Comparison: 04/11/2018 chest radiograph.

CLINICAL DATA: Dyspnea

EXAM:
CHEST - 2 VIEW

[chest lat]
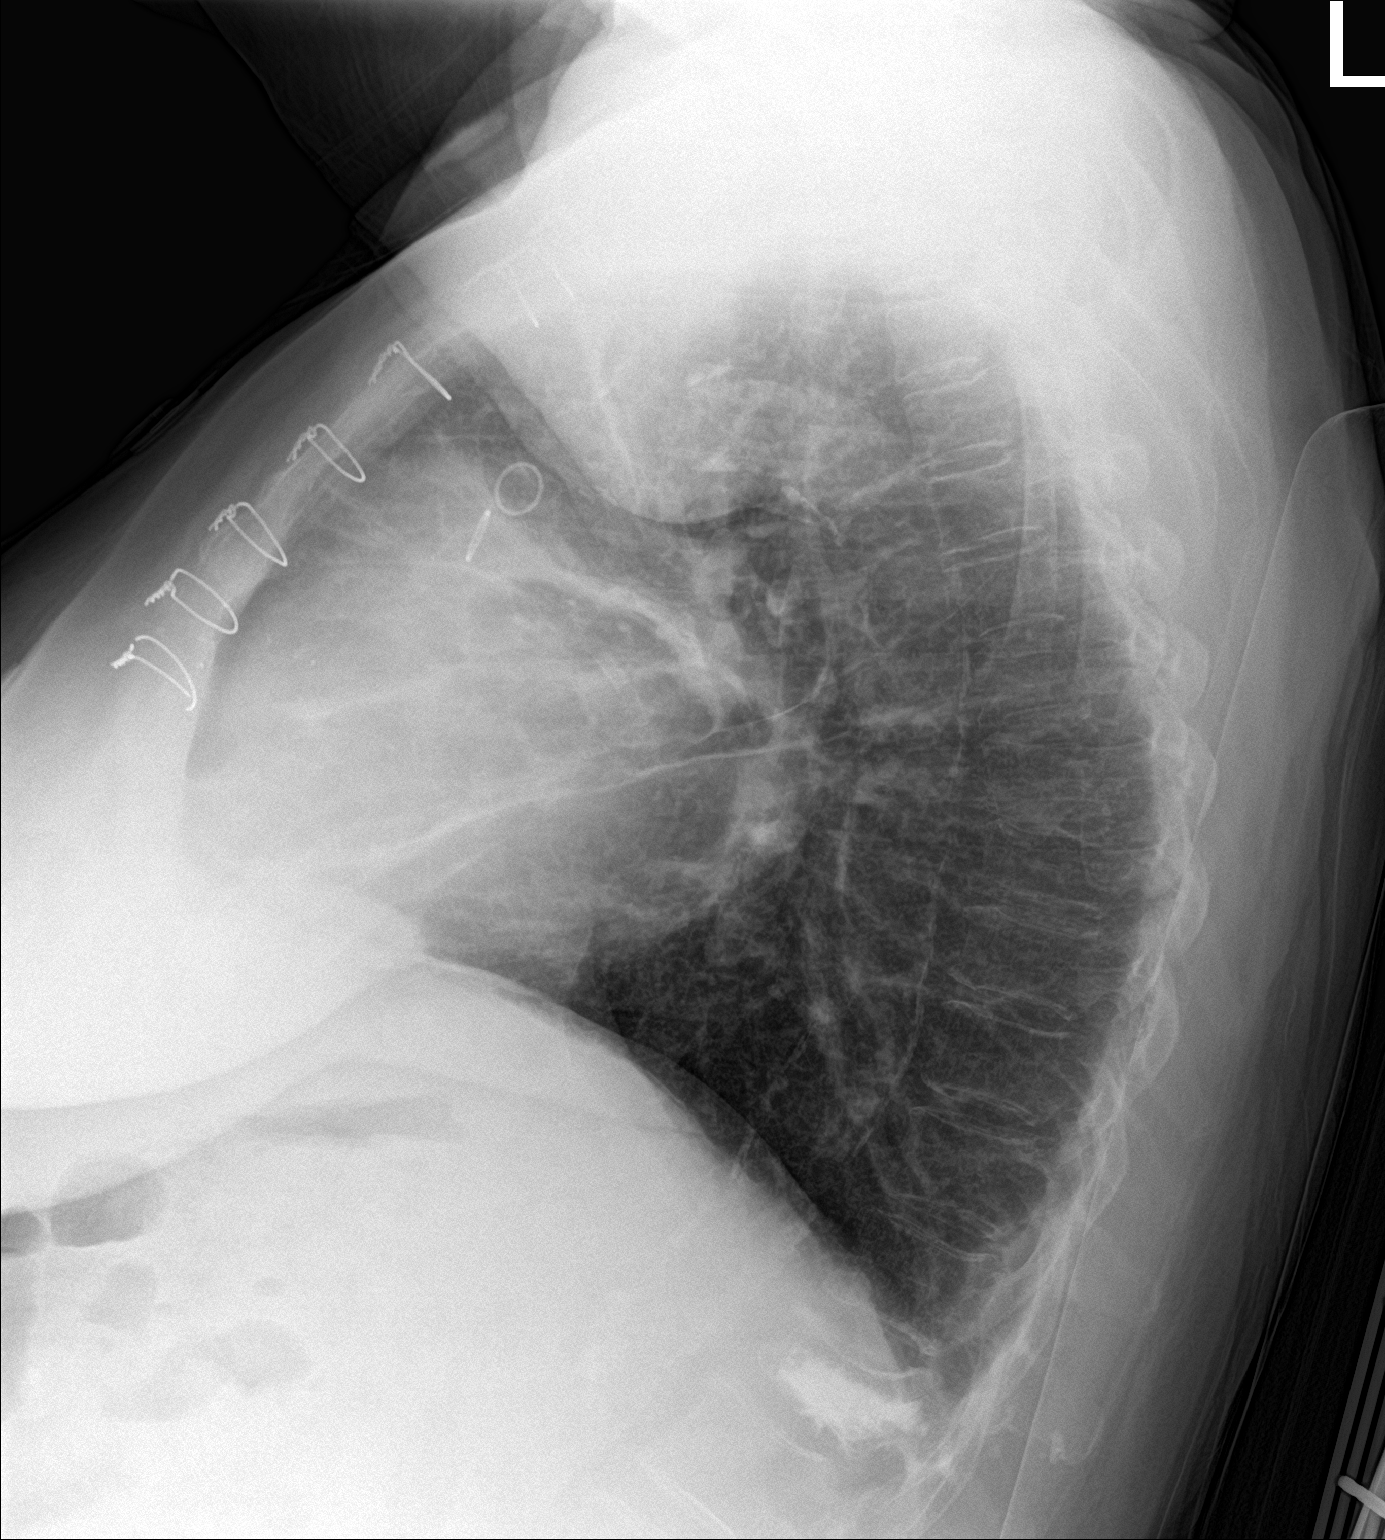

[chest ap]
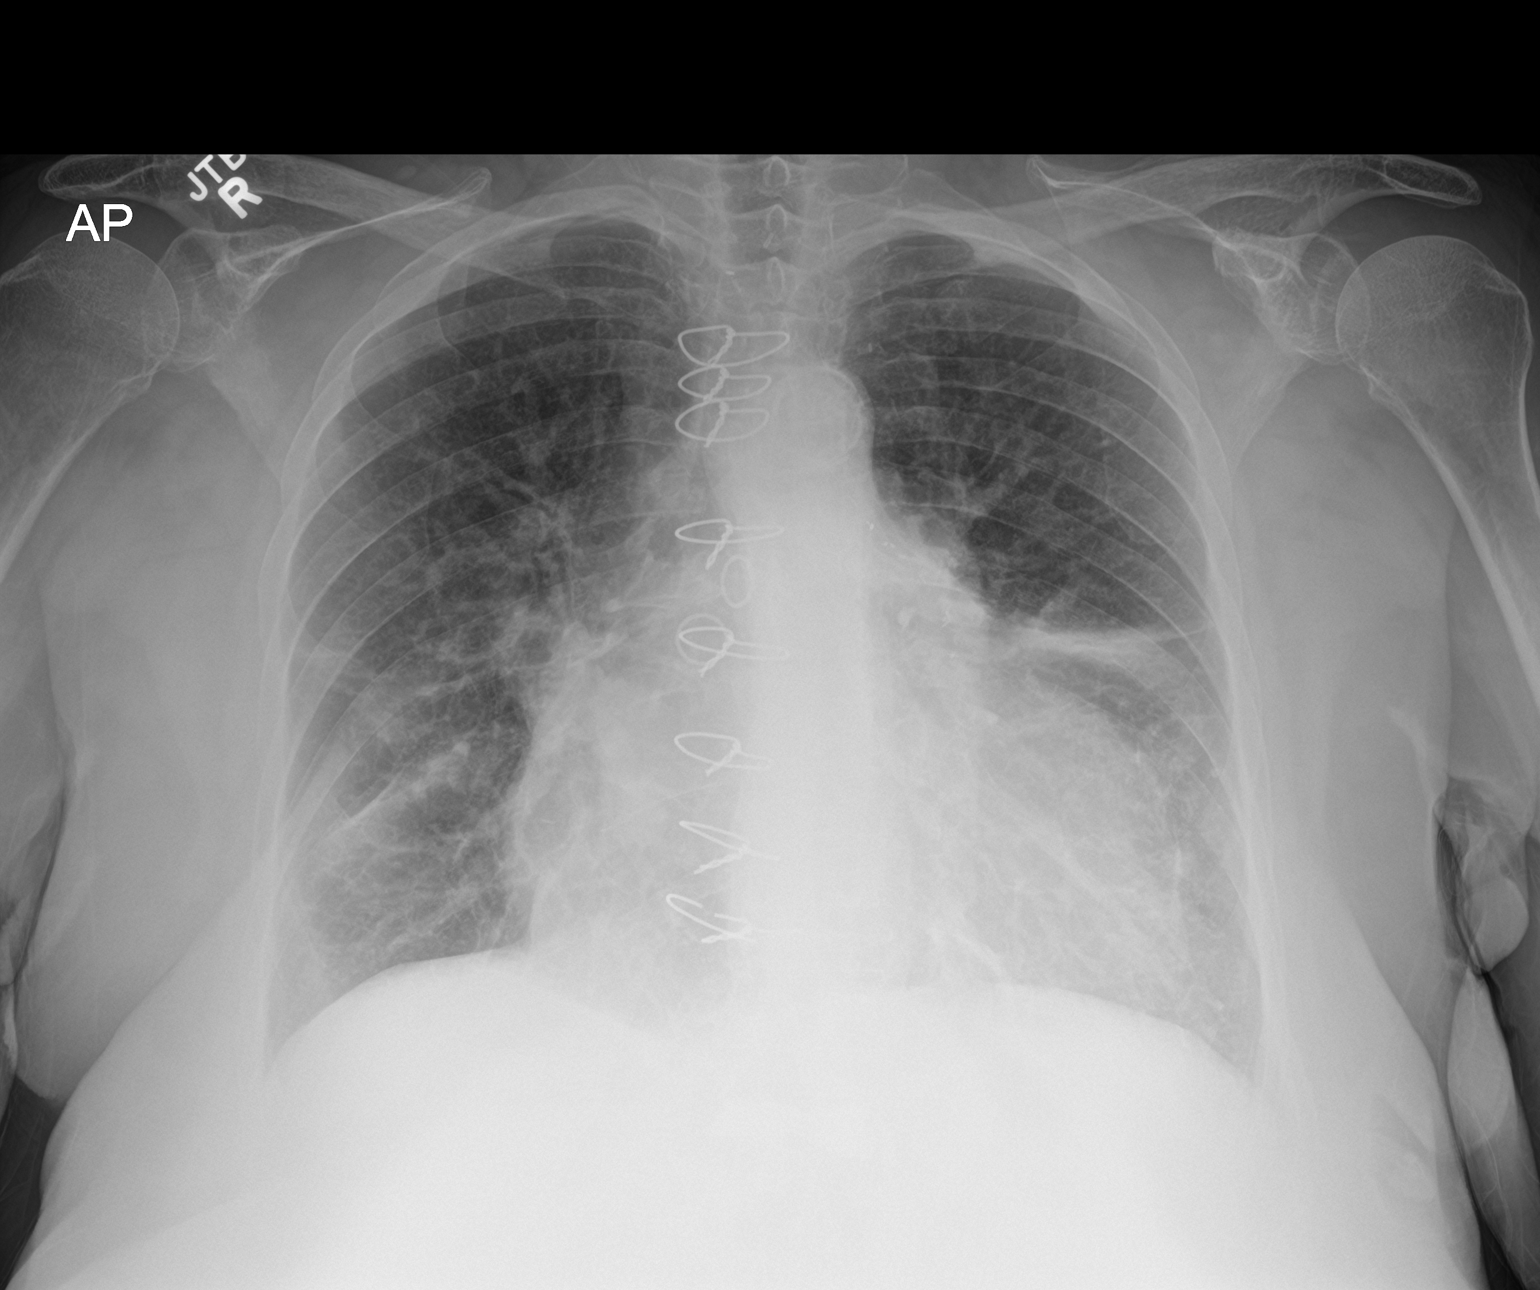

[2 of 2 positions shown; findings below may reference images not displayed]

FINDINGS: Stable configuration of sternotomy wires noting discontinuity in the
lower most sternotomy wire. CABG clips overlie the mediastinum.
Stable cardiomediastinal silhouette with cardiomegaly. No
pneumothorax. No pleural effusion. Stable platelike scarring in the
anterior left mid lung. Hazy opacity in the right middle lobe with
new silhouetting of the right heart border superimposed on chronic
scarring. No overt pulmonary edema.
IMPRESSION: 1. Hazy right middle lobe opacity superimposed on chronic scarring
with new silhouetting of the right heart border, can not exclude
right middle lobe pneumonia. Short-term chest radiograph follow-up
advised.
2. Stable platelike scarring in the anterior left mid lung.
3. Stable cardiomegaly without overt pulmonary edema.

## 2019-04-17 ENCOUNTER — Encounter (HOSPITAL_COMMUNITY): Payer: Self-pay

## 2019-04-17 ENCOUNTER — Emergency Department (HOSPITAL_COMMUNITY)
Admission: EM | Admit: 2019-04-17 | Discharge: 2019-04-18 | Disposition: A | Discharge status: Home / Self Care | Payer: Medicare Other | Attending: Emergency Medicine | Admitting: Emergency Medicine

## 2019-04-17 ENCOUNTER — Other Ambulatory Visit: Payer: Self-pay

## 2019-04-17 DIAGNOSIS — I10 Essential (primary) hypertension: Secondary | ICD-10-CM | POA: Diagnosis not present

## 2019-04-17 DIAGNOSIS — I25119 Atherosclerotic heart disease of native coronary artery with unspecified angina pectoris: Secondary | ICD-10-CM

## 2019-04-17 DIAGNOSIS — R0602 Shortness of breath: Secondary | ICD-10-CM | POA: Diagnosis not present

## 2019-04-17 DIAGNOSIS — I5042 Chronic combined systolic (congestive) and diastolic (congestive) heart failure: Secondary | ICD-10-CM | POA: Insufficient documentation

## 2019-04-17 DIAGNOSIS — N183 Chronic kidney disease, stage 3 unspecified: Secondary | ICD-10-CM | POA: Diagnosis not present

## 2019-04-17 DIAGNOSIS — I130 Hypertensive heart and chronic kidney disease with heart failure and stage 1 through stage 4 chronic kidney disease, or unspecified chronic kidney disease: Secondary | ICD-10-CM | POA: Insufficient documentation

## 2019-04-17 DIAGNOSIS — R079 Chest pain, unspecified: Secondary | ICD-10-CM | POA: Diagnosis not present

## 2019-04-17 DIAGNOSIS — M79602 Pain in left arm: Secondary | ICD-10-CM | POA: Diagnosis not present

## 2019-04-17 DIAGNOSIS — Z8616 Personal history of COVID-19: Secondary | ICD-10-CM | POA: Diagnosis not present

## 2019-04-17 DIAGNOSIS — Z20822 Contact with and (suspected) exposure to covid-19: Secondary | ICD-10-CM | POA: Diagnosis not present

## 2019-04-17 DIAGNOSIS — I2510 Atherosclerotic heart disease of native coronary artery without angina pectoris: Secondary | ICD-10-CM | POA: Insufficient documentation

## 2019-04-17 DIAGNOSIS — Z79899 Other long term (current) drug therapy: Secondary | ICD-10-CM | POA: Insufficient documentation

## 2019-04-17 DIAGNOSIS — R0902 Hypoxemia: Secondary | ICD-10-CM | POA: Diagnosis not present

## 2019-04-17 DIAGNOSIS — Z743 Need for continuous supervision: Secondary | ICD-10-CM | POA: Diagnosis not present

## 2019-04-17 DIAGNOSIS — Z87891 Personal history of nicotine dependence: Secondary | ICD-10-CM | POA: Diagnosis not present

## 2019-04-17 DIAGNOSIS — Z951 Presence of aortocoronary bypass graft: Secondary | ICD-10-CM | POA: Diagnosis not present

## 2019-04-17 DIAGNOSIS — I959 Hypotension, unspecified: Secondary | ICD-10-CM | POA: Diagnosis not present

## 2019-04-17 NOTE — ED Triage Notes (Signed)
EMS called out for chest pain- upon arrival EMS reports pt was having left arm pain, denies chest pain, reports taking 2 asa and 2 nitro, pain decreased to a 4 of 10 by the time she arrived to hospital. Pt says pain is almost gone now. Reports SOB  Accompanied left arm pain, but that has resolved.

## 2019-04-18 ENCOUNTER — Emergency Department (HOSPITAL_COMMUNITY): Payer: Medicare Other

## 2019-04-18 DIAGNOSIS — R079 Chest pain, unspecified: Secondary | ICD-10-CM | POA: Diagnosis not present

## 2019-04-18 LAB — COMPREHENSIVE METABOLIC PANEL
ALT: 14 U/L (ref 0–44)
AST: 41 U/L (ref 15–41)
Albumin: 3 g/dL — ABNORMAL LOW (ref 3.5–5.0)
Alkaline Phosphatase: 73 U/L (ref 38–126)
Anion gap: 10 (ref 5–15)
BUN: 10 mg/dL (ref 8–23)
CO2: 30 mmol/L (ref 22–32)
Calcium: 8.8 mg/dL — ABNORMAL LOW (ref 8.9–10.3)
Chloride: 100 mmol/L (ref 98–111)
Creatinine, Ser: 0.92 mg/dL (ref 0.44–1.00)
GFR calc Af Amer: >60 mL/min (ref >60)
GFR calc non Af Amer: >60 mL/min (ref >60)
Glucose, Bld: 126 mg/dL — ABNORMAL HIGH (ref 70–99)
Potassium: 2.8 mmol/L — ABNORMAL LOW (ref 3.5–5.1)
Sodium: 140 mmol/L (ref 135–145)
Total Bilirubin: 1.7 mg/dL — ABNORMAL HIGH (ref 0.3–1.2)
Total Protein: 6.9 g/dL (ref 6.5–8.1)

## 2019-04-18 LAB — CBC WITH DIFFERENTIAL/PLATELET
Abs Immature Granulocytes: 0 10*3/uL (ref 0.00–0.07)
Basophils Absolute: 0 10*3/uL (ref 0.0–0.1)
Basophils Relative: 1 %
Eosinophils Absolute: 0.2 10*3/uL (ref 0.0–0.5)
Eosinophils Relative: 5 %
HCT: 37.9 % (ref 36.0–46.0)
Hemoglobin: 11.8 g/dL — ABNORMAL LOW (ref 12.0–15.0)
Immature Granulocytes: 0 %
Lymphocytes Relative: 22 %
Lymphs Abs: 0.7 10*3/uL (ref 0.7–4.0)
MCH: 29.8 pg (ref 26.0–34.0)
MCHC: 31.1 g/dL (ref 30.0–36.0)
MCV: 95.7 fL (ref 80.0–100.0)
Monocytes Absolute: 0.3 10*3/uL (ref 0.1–1.0)
Monocytes Relative: 9 %
Neutro Abs: 1.9 10*3/uL (ref 1.7–7.7)
Neutrophils Relative %: 63 %
Platelets: 51 10*3/uL — ABNORMAL LOW (ref 150–400)
RBC: 3.96 MIL/uL (ref 3.87–5.11)
RDW: 15.5 % (ref 11.5–15.5)
WBC: 3 10*3/uL — ABNORMAL LOW (ref 4.0–10.5)
nRBC: 0 % (ref 0.0–0.2)

## 2019-04-18 LAB — TROPONIN I (HIGH SENSITIVITY)
Troponin I (High Sensitivity): 13 ng/L (ref <18)
Troponin I (High Sensitivity): 16 ng/L (ref <18)

## 2019-04-18 MED ORDER — POTASSIUM CHLORIDE CRYS ER 20 MEQ PO TBCR
40.0000 meq | EXTENDED_RELEASE_TABLET | Freq: Once | ORAL | Status: AC
  Administered 2019-04-18: 40 meq via ORAL
  Filled 2019-04-18: qty 2

## 2019-04-18 MED ORDER — ISOSORBIDE MONONITRATE ER 60 MG PO TB24: 90.0000 mg | ORAL_TABLET | Freq: Every day | ORAL | 0 refills | Status: DC

## 2019-04-18 MED ORDER — NITROGLYCERIN 2 % TD OINT
1.0000 [in_us] | TOPICAL_OINTMENT | Freq: Once | TRANSDERMAL | Status: AC
  Administered 2019-04-18: 1 [in_us] via TOPICAL
  Filled 2019-04-18: qty 1

## 2019-04-18 MED ORDER — ASPIRIN 81 MG PO CHEW
162.0000 mg | CHEWABLE_TABLET | Freq: Once | ORAL | Status: AC
  Administered 2019-04-18: 01:00:00 162 mg via ORAL
  Filled 2019-04-18: qty 2

## 2019-04-18 NOTE — Discharge Instructions (Addendum)
Make sure you are taking your Imdur daily, I gave you a refill if you have run out.  Please call Dr. Antionette Char office on Friday, February 19, the office will most likely be closed today due to the bad weather, to let him know that you had to come to the ED in [place] may want to check you whether on the phone or in the office to make sure you are doing better.  Return to the emergency department if you get worse.

## 2019-04-18 NOTE — ED Provider Notes (Signed)
[facility] EMERGENCY DEPARTMENT Provider Note   CSN: [id number] Arrival date & time: 04/17/19  2310   Time seen 12:25 AM  History Chief Complaint  Patient presents with  . Arm Pain    left    JOHNANNA BAKKE is a 69 y.o. female.  HPI   Patient states about 10 PM tonight she went to the bathroom and realized she had not eaten all day.  She states she went into the kitchen to make a sandwich and she had acute onset of severe pain in her left arm.  She states initially it felt like a twisting sensation and her arm felt cold and numb and it went all the way up into her left shoulder and into her chin.  She states then it was an achy pain.  She took 2 nitroglycerin and 2 baby aspirin and states it started to feel a little better.  It lasted about an hour.  She denies any chest pain, diaphoresis, nausea, or vomiting.  She did feel some shortness of breath.  She reports she tested positive for Covid in September and has had persistent diarrhea.  She states now she just has her usual chronic numbness and tingling in her hands and feet that she normally has.  She states she feels fine now.  Patient states she had 3 cardiac stents before she had bypass surgery in 2004.  She has had at least 2 stents since then.  She states she has had an MRI and with that she had mild chest pain but she mainly had nausea and diaphoresis.  She has a history of congestive heart failure.  She denies being on oxygen at home.  She does not smoke.  She states she has a strong family history of heart disease.  PCP Sasser, Silvestre Moment, MD Cardiology Dr Burt Knack  Past Medical History:  Diagnosis Date  . Cataract    OU  . Chronic combined systolic and diastolic CHF (congestive heart failure) ([place])   . Cirrhosis of liver ([place])   . CKD (chronic kidney disease), stage III   . Coronary artery disease    a. s/p CABG 2001 w/ (LIMA-OM, SVG-D1, SVG-RCA). b. h/o multiple PCIs per Dr. Antionette Char note.  . Depression   . Esophageal varices  ([place])    New 2013  . Gastroesophageal reflux disease   . History of pneumonia   . Hyperlipidemia   . Hypertension   . Hypertensive retinopathy    OU  . Obesity   . Osteoarthritis   . Pancytopenia ([place])   . Paroxysmal atrial flutter ([place])    a. dx 05/2016.  [name] Persistent atrial fibrillation ([place])    a. reported in hosp 07/2016, not on anticoag due to cirrhosis and liver disease, low platelets, varices.  . [facility]     Patient Active Problem List   Diagnosis Date Noted  . Iron deficiency anemia 02/05/2019  . Chronic combined systolic and diastolic CHF (congestive heart failure) ([place]) 04/17/2018  . Heart failure ([place]) 04/17/2018  . Osteoarthritis of spine with radiculopathy, cervical region 01/12/2018  . Paresthesia of both hands 12/14/2017  . Recurrent falls 12/14/2017  . Closed compression fracture of first lumbar vertebra ([place]) 08/23/2017  . Lumbar spondylosis with myelopathy 08/23/2017  . Carpal tunnel syndrome of right wrist 04/05/2017  . Rectal pain 03/06/2017  . Influenza A (H1N1) 02/22/2017  . Rectal bleeding 02/22/2017  . Closed fracture of lower end of right radius with routine healing 02/20/2017  . Injury  of triangular fibrocartilage complex of right wrist 02/20/2017  . Pain 02/10/2017  . Other cirrhosis of liver ([place]) 10/26/2016  . Persistent atrial fibrillation 08/16/2016  . Cardiomyopathy, ischemic-35-40% by cath  01/11/2015  . Obesity 09/24/2013  . Unspecified hereditary and idiopathic peripheral neuropathy 01/10/2013  . Hereditary and idiopathic peripheral neuropathy 01/10/2013  . Cirrhosis, non-alcoholic ([place]) 57/32/2025  . Esophageal varices- Plavix stopped 12/23/2011  . GI bleed 12/23/2011  . Idiopathic esophageal varices without bleeding ([place]) 12/23/2011  . Anemia 04/22/2011  . CAD (coronary artery disease) 08/21/2009  . Mixed hyperlipidemia 06/04/2008  . Essential hypertension 06/04/2008  . S/P CABG x 3- 2001 06/04/2008    Past Surgical  History:  Procedure Laterality Date  . ABDOMINAL HYSTERECTOMY    . BACK SURGERY    . CARDIAC CATHETERIZATION  2004   left internal mammary artery to the obtuse marginal  was found to be small and thread like.  The two grafts were patent.  The left circumflex had 90% in-stent restenosis and cutting balloon angioplasty was performed followed by placement of a 3.0 x 82mm Taxus drug -eluting stent.    [name] CARDIAC CATHETERIZATION  2006   There was in-stent restenosis in the left circumflex and this was treated with cutting balloon angioplasty   . CARDIAC CATHETERIZATION  2008   vein graft to the to the obtuse marginal was patent, although small, left circumflex had 40% in-stent restenosis, ejection fraction 40-45%.  The patient was medically mananged.  [name] CARDIAC CATHETERIZATION N/A 01/09/2015   Procedure: Left Heart Cath and Cors/Grafts Angiography;  Surgeon: Belva Crome, MD;  Location: [place] CV LAB;  Service: Cardiovascular;  Laterality: N/A;  . COLONOSCOPY  03/29/2011   Procedure: COLONOSCOPY;  Surgeon: Jamesetta So, MD;  Location[facility];  Service: Gastroenterology;  Laterality: N/A;  . CORONARY ARTERY BYPASS GRAFT  May 31,2001   x 3 with a vein graft to the first diagonal, vein graft to the right coronary  artery, and a free left internal mammary  artery to the obtuse marginal   . ESOPHAGEAL BANDING N/A 07/04/2012   Procedure: ESOPHAGEAL BANDING;  Surgeon: Rogene Houston, MD;  [facility];  Service: Endoscopy;  Laterality: N/A;  . ESOPHAGEAL BANDING N/A 09/17/2012   Procedure: ESOPHAGEAL BANDING;  Surgeon: Rogene Houston, MD;  [facility];  Service: Endoscopy;  Laterality: N/A;  . ESOPHAGEAL BANDING N/A 10/22/2013   Procedure: ESOPHAGEAL BANDING;  Surgeon: Rogene Houston, MD;  [facility];  Service: Endoscopy;  Laterality: N/A;  . ESOPHAGEAL BANDING N/A 11/28/2014   Procedure: ESOPHAGEAL BANDING;  Surgeon: Rogene Houston, MD;  [facility];  Service: Endoscopy;  Laterality: N/A;  . ESOPHAGEAL BANDING N/A 10/29/2015   Procedure: ESOPHAGEAL BANDING;  Surgeon: Rogene Houston, MD;  [facility];  Service: Endoscopy;  Laterality: N/A;  . ESOPHAGOGASTRODUODENOSCOPY  12/20/2011   Procedure: ESOPHAGOGASTRODUODENOSCOPY (EGD);  Surgeon: Jamesetta So, MD;  [facility];  Service: Gastroenterology;  Laterality: N/A;  . ESOPHAGOGASTRODUODENOSCOPY N/A 07/04/2012   Procedure: ESOPHAGOGASTRODUODENOSCOPY (EGD);  Surgeon: Rogene Houston, MD;  [facility];  Service: Endoscopy;  Laterality: N/A;  235-moved to 255 Ann to notify pt  . ESOPHAGOGASTRODUODENOSCOPY N/A 09/17/2012   Procedure: ESOPHAGOGASTRODUODENOSCOPY (EGD);  Surgeon: Rogene Houston, MD;  [facility];  Service: Endoscopy;  Laterality: N/A;  730  . ESOPHAGOGASTRODUODENOSCOPY N/A 10/22/2013   Procedure: ESOPHAGOGASTRODUODENOSCOPY (EGD);  Surgeon: Mechele Dawley  Laural Golden, MD;  [facility];  Service: Endoscopy;  Laterality: N/A;  730  . ESOPHAGOGASTRODUODENOSCOPY N/A 11/28/2014   Procedure: ESOPHAGOGASTRODUODENOSCOPY (EGD);  Surgeon: Rogene Houston, MD;  [facility];  Service: Endoscopy;  Laterality: N/A;  1:25  . ESOPHAGOGASTRODUODENOSCOPY N/A 10/29/2015   Procedure: ESOPHAGOGASTRODUODENOSCOPY (EGD);  Surgeon: Rogene Houston, MD;  [facility];  Service: Endoscopy;  Laterality: N/A;  12:00  . ESOPHAGOGASTRODUODENOSCOPY N/A 12/12/2016   Procedure: ESOPHAGOGASTRODUODENOSCOPY (EGD);  Surgeon: Rogene Houston, MD;  [facility];  Service: Endoscopy;  Laterality: N/A;  225  . FLEXIBLE SIGMOIDOSCOPY N/A 03/20/2017   Procedure: FLEXIBLE SIGMOIDOSCOPY;  Surgeon: Rogene Houston, MD;  [facility];  Service: Endoscopy;  Laterality: N/A;  7:30  . JOINT REPLACEMENT    . LEFT HEART CATH AND CORS/GRAFTS ANGIOGRAPHY N/A 08/15/2016   Procedure: Left Heart Cath and Cors/Grafts Angiography;  Surgeon:  Jettie Booze, MD;  Location: [place] [facility] LAB;  Service: Cardiovascular;  Laterality: N/A;  . LEFT HEART CATHETERIZATION WITH CORONARY/GRAFT ANGIOGRAM N/A 12/25/2013   Procedure: LEFT HEART CATHETERIZATION WITH Beatrix Fetters;  Surgeon: Blane Ohara, MD;  Location: [facility] CATH LAB;  Service: Cardiovascular;  Laterality: N/A;  . RIGHT HEART CATH N/A 05/29/2017   Procedure: RIGHT HEART CATH;  Surgeon: Jolaine Artist, MD;  Location: [place] CV LAB;  Service: Cardiovascular;  Laterality: N/A;  . rotator cuff left  2007  . TONSILLECTOMY       OB History   No obstetric history on file.     Family History  Problem Relation Age of Onset  . Heart attack Mother   . Diabetes Mother   . Heart attack Father 29       cause of death  . Diabetes Father   . Coronary artery disease Sister        CABG   . Diabetes Sister   . Glaucoma Sister   . Diabetes Brother   . Colon cancer Neg Hx     Social History   Tobacco Use  . Smoking status: Former Smoker    Packs/day: 0.50    Years: 20.00    Pack years: 10.00    Types: Cigarettes    Quit date: 07/21/1995    Years since quitting: 23.7  . Smokeless tobacco: Never Used  Substance Use Topics  . Alcohol use: No  . Drug use: No  lives at home Lives alone  Home Medications Prior to Admission medications   Medication Sig Start Date End Date Taking? Authorizing Provider  albuterol (PROVENTIL HFA;VENTOLIN HFA) 108 (90 BASE) MCG/ACT inhaler Inhale 2 puffs into the lungs every 6 (six) hours as needed for wheezing or shortness of breath.     [provider]  ezetimibe (ZETIA) 10 MG tablet Take 10 mg by mouth at bedtime.     [provider]  FLUoxetine (PROZAC) 20 MG capsule Take 20 mg by mouth daily.     [provider]  HYDROcodone-acetaminophen (NORCO) 10-325 MG tablet Take 1 tablet by mouth every 6 (six) hours as needed for moderate pain.  11/06/17   [provider]  hydrocortisone  (ANUSOL-HC) 25 MG suppository Place 1 suppository (25 mg total) rectally at bedtime. 02/10/19   Rogene Houston, MD  isosorbide mononitrate (IMDUR) 60 MG 24 hr tablet Take 1.5 tablets (90 mg total) by mouth daily. 04/18/19 04/12/20  Rolland Porter, MD  metoprolol succinate (TOPROL XL) 25 MG 24 hr tablet Take  1 tablet (25 mg total) by mouth daily. 03/14/18   Weaver, Scott T, PA-C  NITROSTAT 0.4 MG SL tablet Place 0.4 mg under the tongue every 5 (five) minutes as needed for chest pain (x 3 tabs daily).  11/15/13   [provider]  pantoprazole (PROTONIX) 40 MG tablet Take 40 mg by mouth daily.    [provider]  Pediatric Multivitamins-Iron (FLINTSTONES PLUS IRON PO) Take by mouth daily.    [provider]  polyethylene glycol (MIRALAX / GLYCOLAX) packet Take 17 g by mouth daily.    [provider]  spironolactone (ALDACTONE) 25 MG tablet Take 1 tablet (25 mg total) by mouth daily. 08/01/18 07/27/19  Sherren Mocha, MD  torsemide (DEMADEX) 20 MG tablet Take 2 tablets (40 mg total) by mouth daily. 11/15/17 02/12/19  Sherren Mocha, MD  valsartan (DIOVAN) 160 MG tablet Take 1 tablet (160 mg total) by mouth at bedtime. 06/15/17   Shirley Friar, PA-C    Allergies    Entresto [sacubitril-valsartan], Acetaminophen, Oxycodone, Ace inhibitors, Cefaclor, Cephalexin, Penicillins, Pregabalin, and Tape  Review of Systems   Review of Systems  All other systems reviewed and are negative.   Physical Exam Updated Vital Signs BP 131/80   Pulse 70   Temp 97.8 F (36.6 C) (Oral)   Resp (!) 31   Wt 83.5 kg   LMP 03/29/2011   SpO2 93%   BMI 33.67 kg/m   Physical Exam Vitals and nursing note reviewed.  Constitutional:      General: She is not in acute distress.    Appearance: Normal appearance. She is well-developed. She is not ill-appearing or toxic-appearing.  HENT:     Head: Normocephalic and atraumatic.     Right Ear: External ear normal.     Left Ear:  External ear normal.     Nose: Nose normal. No mucosal edema or rhinorrhea.     Mouth/Throat:     Dentition: No dental abscesses.     Pharynx: No uvula swelling.  Eyes:     Conjunctiva/sclera: Conjunctivae normal.     Pupils: Pupils are equal, round, and reactive to light.  Cardiovascular:     Rate and Rhythm: Normal rate and regular rhythm.     Heart sounds: Normal heart sounds. No murmur. No friction rub. No gallop.   Pulmonary:     Effort: Pulmonary effort is normal. No respiratory distress.     Breath sounds: Normal breath sounds. No wheezing, rhonchi or rales.  Chest:     Chest wall: No tenderness or crepitus.  Musculoskeletal:        General: No tenderness. Normal range of motion.     Cervical back: Full passive range of motion without pain, normal range of motion and neck supple.     Right lower leg: No edema.     Left lower leg: No edema.     Comments: Moves all extremities well.   Skin:    General: Skin is warm and dry.     Coloration: Skin is not pale.     Findings: No erythema or rash.  Neurological:     General: No focal deficit present.     Mental Status: She is alert and oriented to person, place, and time.     Cranial Nerves: No cranial nerve deficit.  Psychiatric:        Mood and Affect: Mood normal. Mood is not anxious.        Speech: Speech normal.  Behavior: Behavior normal.        Thought Content: Thought content normal.     ED Results / Procedures / Treatments   Labs (all labs ordered are listed, but only abnormal results are displayed)  Results for orders placed or performed during the hospital encounter of 04/17/19  Comprehensive metabolic panel  Result Value Ref Range   Sodium 140 135 - 145 mmol/L   Potassium 2.8 (L) 3.5 - 5.1 mmol/L   Chloride 100 98 - 111 mmol/L   CO2 30 22 - 32 mmol/L   Glucose, Bld 126 (H) 70 - 99 mg/dL   BUN 10 8 - 23 mg/dL   Creatinine, Ser 0.92 0.44 - 1.00 mg/dL   Calcium 8.8 (L) 8.9 - 10.3 mg/dL   Total  Protein 6.9 6.5 - 8.1 g/dL   Albumin 3.0 (L) 3.5 - 5.0 g/dL   AST 41 15 - 41 U/L   ALT 14 0 - 44 U/L   Alkaline Phosphatase 73 38 - 126 U/L   Total Bilirubin 1.7 (H) 0.3 - 1.2 mg/dL   GFR calc non Af Amer >60 >60 mL/min   GFR calc Af Amer >60 >60 mL/min   Anion gap 10 5 - 15  CBC with Differential  Result Value Ref Range   WBC 3.0 (L) 4.0 - 10.5 K/uL   RBC 3.96 3.87 - 5.11 MIL/uL   Hemoglobin 11.8 (L) 12.0 - 15.0 g/dL   HCT 37.9 36.0 - 46.0 %   MCV 95.7 80.0 - 100.0 fL   MCH 29.8 26.0 - 34.0 pg   MCHC 31.1 30.0 - 36.0 g/dL   RDW 15.5 11.5 - 15.5 %   Platelets 51 (L) 150 - 400 K/uL   nRBC 0.0 0.0 - 0.2 %   Neutrophils Relative % 63 %   Neutro Abs 1.9 1.7 - 7.7 K/uL   Lymphocytes Relative 22 %   Lymphs Abs 0.7 0.7 - 4.0 K/uL   Monocytes Relative 9 %   Monocytes Absolute 0.3 0.1 - 1.0 K/uL   Eosinophils Relative 5 %   Eosinophils Absolute 0.2 0.0 - 0.5 K/uL   Basophils Relative 1 %   Basophils Absolute 0.0 0.0 - 0.1 K/uL   Immature Granulocytes 0 %   Abs Immature Granulocytes 0.00 0.00 - 0.07 K/uL  Troponin I (High Sensitivity)  Result Value Ref Range   Troponin I (High Sensitivity) 13 <18 ng/L  Troponin I (High Sensitivity)  Result Value Ref Range   Troponin I (High Sensitivity) 16 <18 ng/L   Laboratory interpretation all normal except hypokalemia, stable thrombocytopenia   Results for orders placed or performed during the hospital encounter of 02/13/19  SARS CORONAVIRUS 2 (TAT 6-24 HRS) Nasopharyngeal Nasopharyngeal Swab   Specimen: Nasopharyngeal Swab  Result Value Ref Range   SARS Coronavirus 2 POSITIVE (A) NEGATIVE      EKG EKG Interpretation  Date/Time:  Wednesday April 17 2019 23:28:54 EST Ventricular Rate:  70 PR Interval:    QRS Duration: 159 QT Interval:  489 QTC Calculation: 528 R Axis:   99 Text Interpretation: Sinus rhythm Prolonged PR interval Consider left ventricular hypertrophy Abnormal T, consider ischemia, lateral leads Prolonged QT  interval No significant change since last tracing 17 Apr 2018 Confirmed by Rolland Porter [phone]) on 04/18/2019 12:36:27 AM   Radiology DG Chest Port 1 View  Result Date: 04/18/2019 CLINICAL DATA:  Chest pain. Left arm pain. EXAM: PORTABLE CHEST 1 VIEW COMPARISON:  Radiograph 04/17/2018 FINDINGS: Post median sternotomy. Cardiomegaly is  stable. Resolved right middle lobe opacity from prior exam. Bilateral platelike perihilar scarring is again seen. Vascular congestion without evidence of pulmonary edema. No new airspace disease. No pleural fluid or pneumothorax. No acute osseous abnormalities are seen. IMPRESSION: Stable cardiomegaly and vascular congestion. Electronically Signed   By: Keith Rake M.D.   On: 04/18/2019 01:36    Procedures Procedures (including critical care time)  August 15, 2016  Left Heart Cardiac Cath  Ost 1st Diag to 1st Diag lesion, 65 %stenosed. SVG to diagonal is patent.  LIMA attached to SVG to diagonal as a Y graft. LIMA is atretic.  Prox RCA to Mid RCA lesion, 95 %stenosed. SVG to RCA is patent.  Ost Cx to Mid Cx lesion, 40 %stenosed.  The left ventricular ejection fraction is 25-35% by visual estimate.  There is moderate to severe left ventricular systolic dysfunction.  LV end diastolic pressure is moderately elevated.  There is no aortic valve stenosis.   No significant change in coronary circulation.  EF has decreased from prior report, with more pronounced inferior hypokinesis.  May need EP f/u for possible AICD placement.  Echocardiogram November 15, 2017 Study Conclusions   - Left ventricle: The cavity size was normal. Wall thickness was  normal. Systolic function was mildly to moderately reduced. The  estimated ejection fraction was in the range of 40% to 45%.  Inferior and lateral hypokinesis. Doppler parameters are  consistent with pseudonormal left ventricular relaxation (grade 2  diastolic dysfunction). The E/e&' ratio is >15,  suggesting  elevated LV filling pressure.  - Left atrium: The atrium was mildly dilated.  - Tricuspid valve: There was trivial regurgitation.  - Pulmonary arteries: PA peak pressure: 37 mm Hg (S).  - Inferior vena cava: The vessel was dilated. The respirophasic  diameter changes were blunted (< 50%), consistent with elevated  central venous pressure.   Impressions:   - Compared to a prior study in 04/2017, the LVEF is lower at 40-45%  with inferior and lateral wall hypokinesis, grade 2 DD and  elevated LV filling pressure.   Medications Ordered in ED Medications  nitroGLYCERIN (NITROGLYN) 2 % ointment 1 inch (1 inch Topical Given 04/18/19 0106)  aspirin chewable tablet 162 mg (162 mg Oral Given 04/18/19 0106)  potassium chloride SA (KLOR-CON) CR tablet 40 mEq (40 mEq Oral Given 04/18/19 0210)    ED Course  I have reviewed the triage vital signs and the nursing notes.  Pertinent labs & imaging results that were available during my care of the patient were reviewed by me and considered in my medical decision making (see chart for details).    MDM Rules/Calculators/A&P                      Patient had already taken to baby aspirin prior to coming to the ED, she was given 2 more in the ED.  1 inch of nitroglycerin paste was placed on her chest.  She currently states she is pain-free.  Cardiac testing was done.  Concern is that she was having atypical stent comes with her coronary artery disease.  Patient remained pain-free during her ED visit.  Her delta troponin is negative.  When I rechecked the patient around 3:30 AM she states she was feeling fine and she is comfortable going home.  The second troponin was drawn about 5 hours after onset of her pain.  Patient at first was not sure if she still taking the Imdur however when she  saw her discharge instructions and follow the generic name of it she states she is currently taking it.  Final Clinical Impression(s) / ED Diagnoses  Final diagnoses:  Left arm pain    Rx / DC Orders ED Discharge Orders         Ordered    isosorbide mononitrate (IMDUR) 60 MG 24 hr tablet  Daily    Note to Pharmacy: Please consider 90 day supplies to promote better adherence   04/18/19 0350         Plan discharge  Rolland Porter, MD, Barbette Or, MD 04/18/19 [phone]

## 2019-04-25 ENCOUNTER — Encounter (INDEPENDENT_AMBULATORY_CARE_PROVIDER_SITE_OTHER): Payer: Self-pay | Admitting: *Deleted

## 2019-04-26 DIAGNOSIS — I10 Essential (primary) hypertension: Secondary | ICD-10-CM | POA: Diagnosis not present

## 2019-04-26 DIAGNOSIS — E7849 Other hyperlipidemia: Secondary | ICD-10-CM | POA: Diagnosis not present

## 2019-05-08 ENCOUNTER — Other Ambulatory Visit (INDEPENDENT_AMBULATORY_CARE_PROVIDER_SITE_OTHER): Payer: Self-pay | Admitting: *Deleted

## 2019-05-08 DIAGNOSIS — K7460 Unspecified cirrhosis of liver: Secondary | ICD-10-CM

## 2019-05-09 DIAGNOSIS — H2513 Age-related nuclear cataract, bilateral: Secondary | ICD-10-CM | POA: Diagnosis not present

## 2019-05-09 DIAGNOSIS — H43822 Vitreomacular adhesion, left eye: Secondary | ICD-10-CM | POA: Diagnosis not present

## 2019-05-10 NOTE — H&P (Signed)
Surgical History & Physical  Patient Name: Samantha [name] DOB: 10/24/1950  Surgery: Cataract extraction with intraocular lens implant phacoemulsification; Right Eye  Surgeon: Baruch Goldmann MD Surgery Date:  05/17/2019 Pre-Op Date:  05/09/2019  HPI: A 70 Yr. old female patient 1. The patient complains of difficulty when seeing street signs, which began 1 years ago. Both eyes are affected. The episode is gradual. The condition's severity increased since last visit. Symptoms occur when the patient is driving, inside and outside. The complaint is associated with glare. Pt states she does not prefer night time driving. -referred by Dr Coralyn Pear for cataract eval. being followed for bleeding in left eye per pt.(no faxed notes) HPI was performed by Baruch Goldmann .  Medical History: Cataracts Depression Heart Problem High Blood Pressure  Review of Systems Negative Allergic/Immunologic Negative Cardiovascular Negative Constitutional Negative Ear, Nose, Mouth & Throat Negative Endocrine Negative Eyes Negative Gastrointestinal Negative Genitourinary Negative Hemotologic/Lymphatic Negative Integumentary Negative Musculoskeletal Negative Neurological Negative Psychiatry Negative Respiratory  Social   Never Smoked  Medication Fluoxetine, Valsartan, Pantoprazole, Furosemide, Metoprolol, Ezetimibe, Isosorbide Dinitrate,   Sx/Procedures Heart Bypass, Hip Replacement, Ankle Surgery,   Drug Allergies  Penicilin, Ceclor, Keflex,   History & Physical: Heent:  Cataract, Right eye NECK: supple without bruits LUNGS: lungs clear to auscultation CV: regular rate and rhythm Abdomen: soft and non-tender  Impression & Plan: Assessment: 1.  NUCLEAR SCLEROSIS AGE RELATED; Both Eyes (H25.13) 2.  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  Cataract accounts for the patient's decreased vision. This visual impairment is not correctable  with a tolerable change in glasses or contact lenses. Cataract surgery with an implantation of a new lens should significantly improve the visual and functional status of the patient. Discussed all risks, benefits, alternatives, and potential complications. Discussed the procedures and recovery. Patient desires to have surgery. A-scan ordered and performed today for intra-ocular lens calculations. The surgery will be performed in order to improve vision for driving, reading, and for eye examinations. Recommend phacoemulsification with intra-ocular lens. Right Eye worse - first. Dilates well - shugarcaine by protocol. 2.  Stable. OCT macula today. 3.  Toric lens OS.

## 2019-05-13 ENCOUNTER — Other Ambulatory Visit: Payer: Self-pay

## 2019-05-13 ENCOUNTER — Ambulatory Visit (HOSPITAL_COMMUNITY)
Admission: RE | Admit: 2019-05-13 | Discharge: 2019-05-13 | Disposition: A | Discharge status: Home / Self Care | Payer: Medicare Other | Source: Ambulatory Visit | Attending: Internal Medicine | Admitting: Internal Medicine

## 2019-05-13 DIAGNOSIS — K7460 Unspecified cirrhosis of liver: Secondary | ICD-10-CM | POA: Diagnosis not present

## 2019-05-13 DIAGNOSIS — H2511 Age-related nuclear cataract, right eye: Secondary | ICD-10-CM | POA: Diagnosis not present

## 2019-05-14 NOTE — Patient Instructions (Signed)
Samantha [name]  05/14/2019     @PREFPERIOPPHARMACY @   Your procedure is scheduled on  05/17/2019   Report to [facility] at  [facility]  Call this number if you have problems the morning of surgery:  [phone]   Remember:  Do not eat or drink after midnight.                       Take these medicines the morning of surgery with A SIP OF WATER  Prozac, hydrocodone(if needed), ISOSORBIDE, METOPROLOL, PROTONIX.    Do not wear jewelry, make-up or nail polish.  Do not wear lotions, powders, or perfumes. Please wear deodorant and brush your teeth.  Do not shave 48 hours prior to surgery.  Men may shave face and neck.  Do not bring valuables to the hospital.  [facility] is not responsible for any belongings or valuables.  Contacts, dentures or bridgework may not be worn into surgery.  Leave your suitcase in the car.  After surgery it may be brought to your room.  For patients admitted to the hospital, discharge time will be determined by your treatment team.  Patients discharged the day of surgery will not be allowed to drive home.   Name and phone number of your driver:   family Special instructions:  DO NOT smoke the morning of your procedure.  Please read over the following fact sheets that you were given. Anesthesia Post-op Instructions and Care and Recovery After Surgery       Cataract Surgery, Care After This sheet gives you information about how to care for yourself after your procedure. Your health care provider may also give you more specific instructions. If you have problems or questions, contact your health care provider. What can I expect after the procedure? After the procedure, it is common to have:  Itching.  Discomfort.  Fluid discharge.  Sensitivity to light and to touch.  Bruising in or around the eye.  Mild blurred vision. Follow these instructions at home: Eye care   Do not touch or rub your eyes.  Protect your eyes as told by  your health care provider. You may be told to wear a protective eye shield or sunglasses.  Do not put a contact lens into the affected eye or eyes until your health care provider approves.  Keep the area around your eye clean and dry: ? Avoid swimming. ? Do not allow water to hit you directly in the face while showering. ? Keep soap and shampoo out of your eyes.  Check your eye every day for signs of infection. Watch for: ? Redness, swelling, or pain. ? Fluid, blood, or pus. ? Warmth. ? A bad smell. ? Vision that is getting worse. ? Sensitivity that is getting worse. Activity  Do not drive for 24 hours if you were given a sedative during your procedure.  Avoid strenuous activities, such as playing contact sports, for as long as told by your health care provider.  Do not drive or use heavy machinery until your health care provider approves.  Do not bend or lift heavy objects. Bending increases pressure in the eye. You can walk, climb stairs, and do light household chores.  Ask your health care provider when you can return to work. If you work in a dusty environment, you may be advised to wear protective eyewear for a period of time. General instructions  Take or apply over-the-counter and  prescription medicines only as told by your health care provider. This includes eye drops.  Keep all follow-up visits as told by your health care provider. This is important. Contact a health care provider if:  You have increased bruising around your eye.  You have pain that is not helped with medicine.  You have a fever.  You have redness, swelling, or pain in your eye.  You have fluid, blood, or pus coming from your incision.  Your vision gets worse.  Your sensitivity to light gets worse. Get help right away if:  You have sudden loss of vision.  You see flashes of light or spots (floaters).  You have severe eye pain.  You develop nausea or vomiting. Summary  After your  procedure, it is common to have itching, discomfort, bruising, fluid discharge, or sensitivity to light.  Follow instructions from your health care provider about caring for your eye after the procedure.  Do not rub your eye after the procedure. You may need to wear eye protection or sunglasses. Do not wear contact lenses. Keep the area around your eye clean and dry.  Avoid activities that require a lot of effort. These include playing sports and lifting heavy objects.  Contact a health care provider if you have increased bruising, pain that does not go away, or a fever. Get help right away if you suddenly lose your vision, see flashes of light or spots, or have severe pain in the eye. This information is not intended to replace advice given to you by your health care provider. Make sure you discuss any questions you have with your health care provider. Document Revised: 12/11/2018 Document Reviewed: 08/14/2017 Elsevier Patient Education  2020 [place] [employer] These instructions provide you with information about caring for yourself after your procedure. Your health care provider may also give you more specific instructions. Your treatment has been planned according to current medical practices, but problems sometimes occur. Call your health care provider if you have any problems or questions after your procedure. What can I expect after the procedure? After your procedure, you may:  Feel sleepy for several hours.  Feel clumsy and have poor balance for several hours.  Feel forgetful about what happened after the procedure.  Have poor judgment for several hours.  Feel nauseous or vomit.  Have a sore throat if you had a breathing tube during the procedure. Follow these instructions at home: For at least 24 hours after the procedure:      Have a responsible adult stay with you. It is important to have someone help care for you until you are awake and  alert.  Rest as needed.  Do not: ? Participate in activities in which you could fall or become injured. ? Drive. ? Use heavy machinery. ? Drink alcohol. ? Take sleeping pills or medicines that cause drowsiness. ? Make important decisions or sign legal documents. ? Take care of children on your own. Eating and drinking  Follow the diet that is recommended by your health care provider.  If you vomit, drink water, juice, or soup when you can drink without vomiting.  Make sure you have little or no nausea before eating solid foods. General instructions  Take over-the-counter and prescription medicines only as told by your health care provider.  If you have sleep apnea, surgery and certain medicines can increase your risk for breathing problems. Follow instructions from your health care provider about wearing your sleep device: ? Anytime  you are sleeping, including during daytime naps. ? While taking prescription pain medicines, sleeping medicines, or medicines that make you drowsy.  If you smoke, do not smoke without supervision.  Keep all follow-up visits as told by your health care provider. This is important. Contact a health care provider if:  You keep feeling nauseous or you keep vomiting.  You feel light-headed.  You develop a rash.  You have a fever. Get help right away if:  You have trouble breathing. Summary  For several hours after your procedure, you may feel sleepy and have poor judgment.  Have a responsible adult stay with you for at least 24 hours or until you are awake and alert. This information is not intended to replace advice given to you by your health care provider. Make sure you discuss any questions you have with your health care provider. Document Revised: 05/15/2017 Document Reviewed: 06/07/2015 Elsevier Patient Education  [place].

## 2019-05-15 ENCOUNTER — Other Ambulatory Visit (HOSPITAL_COMMUNITY)
Admission: RE | Admit: 2019-05-15 | Discharge: 2019-05-15 | Disposition: A | Discharge status: Home / Self Care | Payer: Medicare Other | Source: Ambulatory Visit | Attending: Ophthalmology | Admitting: Ophthalmology

## 2019-05-15 ENCOUNTER — Encounter (HOSPITAL_COMMUNITY): Payer: Self-pay

## 2019-05-15 ENCOUNTER — Other Ambulatory Visit: Payer: Self-pay | Admitting: *Deleted

## 2019-05-15 ENCOUNTER — Encounter (HOSPITAL_COMMUNITY)
Admission: RE | Admit: 2019-05-15 | Discharge: 2019-05-15 | Disposition: A | Discharge status: Home / Self Care | Payer: Medicare Other | Source: Ambulatory Visit | Attending: Ophthalmology | Admitting: Ophthalmology

## 2019-05-15 ENCOUNTER — Other Ambulatory Visit: Payer: Self-pay

## 2019-05-15 DIAGNOSIS — Z20822 Contact with and (suspected) exposure to covid-19: Secondary | ICD-10-CM | POA: Insufficient documentation

## 2019-05-15 DIAGNOSIS — Z01812 Encounter for preprocedural laboratory examination: Secondary | ICD-10-CM | POA: Insufficient documentation

## 2019-05-15 LAB — CBC WITH DIFFERENTIAL/PLATELET
Abs Immature Granulocytes: 0.02 10*3/uL (ref 0.00–0.07)
Basophils Absolute: 0 10*3/uL (ref 0.0–0.1)
Basophils Relative: 1 %
Eosinophils Absolute: 0.2 10*3/uL (ref 0.0–0.5)
Eosinophils Relative: 4 %
HCT: 41.9 % (ref 36.0–46.0)
Hemoglobin: 12.7 g/dL (ref 12.0–15.0)
Immature Granulocytes: 1 %
Lymphocytes Relative: 24 %
Lymphs Abs: 1 10*3/uL (ref 0.7–4.0)
MCH: 29.1 pg (ref 26.0–34.0)
MCHC: 30.3 g/dL (ref 30.0–36.0)
MCV: 95.9 fL (ref 80.0–100.0)
Monocytes Absolute: 0.3 10*3/uL (ref 0.1–1.0)
Monocytes Relative: 7 %
Neutro Abs: 2.6 10*3/uL (ref 1.7–7.7)
Neutrophils Relative %: 63 %
Platelets: 62 10*3/uL — ABNORMAL LOW (ref 150–400)
RBC: 4.37 MIL/uL (ref 3.87–5.11)
RDW: 16.2 % — ABNORMAL HIGH (ref 11.5–15.5)
WBC: 4 10*3/uL (ref 4.0–10.5)
nRBC: 0 % (ref 0.0–0.2)

## 2019-05-15 LAB — COMPREHENSIVE METABOLIC PANEL
ALT: 17 U/L (ref 0–44)
AST: 43 U/L — ABNORMAL HIGH (ref 15–41)
Albumin: 3.1 g/dL — ABNORMAL LOW (ref 3.5–5.0)
Alkaline Phosphatase: 79 U/L (ref 38–126)
Anion gap: 11 (ref 5–15)
BUN: 11 mg/dL (ref 8–23)
CO2: 26 mmol/L (ref 22–32)
Calcium: 8.9 mg/dL (ref 8.9–10.3)
Chloride: 103 mmol/L (ref 98–111)
Creatinine, Ser: 1.03 mg/dL — ABNORMAL HIGH (ref 0.44–1.00)
GFR calc Af Amer: >60 mL/min (ref >60)
GFR calc non Af Amer: 55 mL/min — ABNORMAL LOW (ref >60)
Glucose, Bld: 196 mg/dL — ABNORMAL HIGH (ref 70–99)
Potassium: 4 mmol/L (ref 3.5–5.1)
Sodium: 140 mmol/L (ref 135–145)
Total Bilirubin: 2.3 mg/dL — ABNORMAL HIGH (ref 0.3–1.2)
Total Protein: 7.3 g/dL (ref 6.5–8.1)

## 2019-05-15 LAB — SARS CORONAVIRUS 2 (TAT 6-24 HRS): SARS Coronavirus 2: NEGATIVE

## 2019-05-15 LAB — PROTIME-INR
INR: 1.4 — ABNORMAL HIGH (ref 0.8–1.2)
Prothrombin Time: 17.4 seconds — ABNORMAL HIGH (ref 11.4–15.2)

## 2019-05-15 NOTE — Patient Outreach (Signed)
[facility]) Care Management  05/15/2019  MIRIAH MARUYAMA [birth year] [id number]   RN Health CoachEvery Other MonthOutreach  Referral Date:08/20/2018 Referral Source:[employer] High Risk List Reason for Referral:Assess Needs Insurance:[employer]   Outreach Attempt:  Outreach attempt #3 to patient for follow up. No answer. RN Health Coach left HIPAA compliant voicemail message along with contact information.  Plan:  RN Health Coach will send unsuccessful outreach letter to patient.  RN Health Coach will make another outreach attempt to patient within the month of April if no return call back from patient.  [place] [phone] [email or web address]

## 2019-05-17 ENCOUNTER — Encounter (HOSPITAL_COMMUNITY)
Admission: RE | Disposition: A | Discharge status: Home / Self Care | Payer: Self-pay | Source: Home / Self Care | Attending: Ophthalmology

## 2019-05-17 ENCOUNTER — Encounter (HOSPITAL_COMMUNITY): Payer: Self-pay

## 2019-05-17 ENCOUNTER — Encounter (HOSPITAL_COMMUNITY): Payer: Self-pay | Admitting: Ophthalmology

## 2019-05-17 ENCOUNTER — Emergency Department (HOSPITAL_COMMUNITY): Payer: Medicare Other

## 2019-05-17 ENCOUNTER — Ambulatory Visit (HOSPITAL_COMMUNITY): Payer: Medicare Other | Admitting: Anesthesiology

## 2019-05-17 ENCOUNTER — Ambulatory Visit (HOSPITAL_COMMUNITY)
Admission: RE | Admit: 2019-05-17 | Discharge: 2019-05-17 | Disposition: A | Discharge status: Home / Self Care | Payer: Medicare Other | Source: Home / Self Care | Attending: Ophthalmology | Admitting: Ophthalmology

## 2019-05-17 ENCOUNTER — Emergency Department (HOSPITAL_COMMUNITY)
Admission: EM | Admit: 2019-05-17 | Discharge: 2019-05-17 | Disposition: A | Discharge status: Home / Self Care | Payer: Medicare Other | Source: Home / Self Care | Attending: Emergency Medicine | Admitting: Emergency Medicine

## 2019-05-17 DIAGNOSIS — K5900 Constipation, unspecified: Secondary | ICD-10-CM | POA: Diagnosis not present

## 2019-05-17 DIAGNOSIS — I700 Atherosclerosis of aorta: Secondary | ICD-10-CM | POA: Diagnosis not present

## 2019-05-17 DIAGNOSIS — K219 Gastro-esophageal reflux disease without esophagitis: Secondary | ICD-10-CM | POA: Insufficient documentation

## 2019-05-17 DIAGNOSIS — Z951 Presence of aortocoronary bypass graft: Secondary | ICD-10-CM | POA: Insufficient documentation

## 2019-05-17 DIAGNOSIS — K7460 Unspecified cirrhosis of liver: Secondary | ICD-10-CM | POA: Insufficient documentation

## 2019-05-17 DIAGNOSIS — R0789 Other chest pain: Secondary | ICD-10-CM | POA: Insufficient documentation

## 2019-05-17 DIAGNOSIS — I480 Paroxysmal atrial fibrillation: Secondary | ICD-10-CM | POA: Diagnosis not present

## 2019-05-17 DIAGNOSIS — I10 Essential (primary) hypertension: Secondary | ICD-10-CM | POA: Diagnosis not present

## 2019-05-17 DIAGNOSIS — F418 Other specified anxiety disorders: Secondary | ICD-10-CM | POA: Diagnosis not present

## 2019-05-17 DIAGNOSIS — E669 Obesity, unspecified: Secondary | ICD-10-CM | POA: Insufficient documentation

## 2019-05-17 DIAGNOSIS — R1011 Right upper quadrant pain: Secondary | ICD-10-CM | POA: Diagnosis not present

## 2019-05-17 DIAGNOSIS — J9621 Acute and chronic respiratory failure with hypoxia: Secondary | ICD-10-CM | POA: Diagnosis not present

## 2019-05-17 DIAGNOSIS — I2510 Atherosclerotic heart disease of native coronary artery without angina pectoris: Secondary | ICD-10-CM | POA: Insufficient documentation

## 2019-05-17 DIAGNOSIS — I252 Old myocardial infarction: Secondary | ICD-10-CM | POA: Insufficient documentation

## 2019-05-17 DIAGNOSIS — N179 Acute kidney failure, unspecified: Secondary | ICD-10-CM | POA: Diagnosis not present

## 2019-05-17 DIAGNOSIS — R109 Unspecified abdominal pain: Secondary | ICD-10-CM | POA: Diagnosis not present

## 2019-05-17 DIAGNOSIS — Z79899 Other long term (current) drug therapy: Secondary | ICD-10-CM | POA: Insufficient documentation

## 2019-05-17 DIAGNOSIS — I340 Nonrheumatic mitral (valve) insufficiency: Secondary | ICD-10-CM | POA: Diagnosis not present

## 2019-05-17 DIAGNOSIS — E782 Mixed hyperlipidemia: Secondary | ICD-10-CM | POA: Insufficient documentation

## 2019-05-17 DIAGNOSIS — R1013 Epigastric pain: Secondary | ICD-10-CM

## 2019-05-17 DIAGNOSIS — Z6831 Body mass index (BMI) 31.0-31.9, adult: Secondary | ICD-10-CM | POA: Insufficient documentation

## 2019-05-17 DIAGNOSIS — I8510 Secondary esophageal varices without bleeding: Secondary | ICD-10-CM | POA: Diagnosis not present

## 2019-05-17 DIAGNOSIS — Z955 Presence of coronary angioplasty implant and graft: Secondary | ICD-10-CM | POA: Insufficient documentation

## 2019-05-17 DIAGNOSIS — D61818 Other pancytopenia: Secondary | ICD-10-CM | POA: Diagnosis not present

## 2019-05-17 DIAGNOSIS — H547 Unspecified visual loss: Secondary | ICD-10-CM | POA: Diagnosis not present

## 2019-05-17 DIAGNOSIS — I509 Heart failure, unspecified: Secondary | ICD-10-CM | POA: Insufficient documentation

## 2019-05-17 DIAGNOSIS — Z87891 Personal history of nicotine dependence: Secondary | ICD-10-CM | POA: Insufficient documentation

## 2019-05-17 DIAGNOSIS — K7581 Nonalcoholic steatohepatitis (NASH): Secondary | ICD-10-CM | POA: Insufficient documentation

## 2019-05-17 DIAGNOSIS — Z7901 Long term (current) use of anticoagulants: Secondary | ICD-10-CM | POA: Insufficient documentation

## 2019-05-17 DIAGNOSIS — D638 Anemia in other chronic diseases classified elsewhere: Secondary | ICD-10-CM | POA: Diagnosis not present

## 2019-05-17 DIAGNOSIS — I4892 Unspecified atrial flutter: Secondary | ICD-10-CM | POA: Diagnosis not present

## 2019-05-17 DIAGNOSIS — J9811 Atelectasis: Secondary | ICD-10-CM | POA: Diagnosis not present

## 2019-05-17 DIAGNOSIS — H2511 Age-related nuclear cataract, right eye: Secondary | ICD-10-CM | POA: Insufficient documentation

## 2019-05-17 DIAGNOSIS — J449 Chronic obstructive pulmonary disease, unspecified: Secondary | ICD-10-CM | POA: Diagnosis not present

## 2019-05-17 DIAGNOSIS — I4819 Other persistent atrial fibrillation: Secondary | ICD-10-CM | POA: Insufficient documentation

## 2019-05-17 DIAGNOSIS — Z20822 Contact with and (suspected) exposure to covid-19: Secondary | ICD-10-CM | POA: Diagnosis not present

## 2019-05-17 DIAGNOSIS — F329 Major depressive disorder, single episode, unspecified: Secondary | ICD-10-CM | POA: Insufficient documentation

## 2019-05-17 DIAGNOSIS — K766 Portal hypertension: Secondary | ICD-10-CM | POA: Diagnosis not present

## 2019-05-17 DIAGNOSIS — I255 Ischemic cardiomyopathy: Secondary | ICD-10-CM | POA: Insufficient documentation

## 2019-05-17 DIAGNOSIS — I130 Hypertensive heart and chronic kidney disease with heart failure and stage 1 through stage 4 chronic kidney disease, or unspecified chronic kidney disease: Secondary | ICD-10-CM | POA: Diagnosis not present

## 2019-05-17 DIAGNOSIS — Z743 Need for continuous supervision: Secondary | ICD-10-CM | POA: Diagnosis not present

## 2019-05-17 DIAGNOSIS — J9601 Acute respiratory failure with hypoxia: Secondary | ICD-10-CM | POA: Diagnosis not present

## 2019-05-17 DIAGNOSIS — N183 Chronic kidney disease, stage 3 unspecified: Secondary | ICD-10-CM | POA: Diagnosis not present

## 2019-05-17 DIAGNOSIS — K838 Other specified diseases of biliary tract: Secondary | ICD-10-CM | POA: Diagnosis not present

## 2019-05-17 DIAGNOSIS — I5043 Acute on chronic combined systolic (congestive) and diastolic (congestive) heart failure: Secondary | ICD-10-CM | POA: Diagnosis not present

## 2019-05-17 DIAGNOSIS — I491 Atrial premature depolarization: Secondary | ICD-10-CM | POA: Diagnosis not present

## 2019-05-17 DIAGNOSIS — D509 Iron deficiency anemia, unspecified: Secondary | ICD-10-CM | POA: Diagnosis not present

## 2019-05-17 DIAGNOSIS — I5042 Chronic combined systolic (congestive) and diastolic (congestive) heart failure: Secondary | ICD-10-CM | POA: Diagnosis not present

## 2019-05-17 DIAGNOSIS — I361 Nonrheumatic tricuspid (valve) insufficiency: Secondary | ICD-10-CM | POA: Diagnosis not present

## 2019-05-17 DIAGNOSIS — R0689 Other abnormalities of breathing: Secondary | ICD-10-CM | POA: Diagnosis not present

## 2019-05-17 DIAGNOSIS — R079 Chest pain, unspecified: Secondary | ICD-10-CM | POA: Diagnosis not present

## 2019-05-17 DIAGNOSIS — K7469 Other cirrhosis of liver: Secondary | ICD-10-CM | POA: Diagnosis not present

## 2019-05-17 HISTORY — PX: CATARACT EXTRACTION W/PHACO: SHX586

## 2019-05-17 LAB — HEPATIC FUNCTION PANEL
ALT: 16 U/L (ref 0–44)
AST: 52 U/L — ABNORMAL HIGH (ref 15–41)
Albumin: 3 g/dL — ABNORMAL LOW (ref 3.5–5.0)
Alkaline Phosphatase: 76 U/L (ref 38–126)
Bilirubin, Direct: 0.9 mg/dL — ABNORMAL HIGH (ref 0.0–0.2)
Indirect Bilirubin: 1.6 mg/dL — ABNORMAL HIGH (ref 0.3–0.9)
Total Bilirubin: 2.5 mg/dL — ABNORMAL HIGH (ref 0.3–1.2)
Total Protein: 6.7 g/dL (ref 6.5–8.1)

## 2019-05-17 LAB — BASIC METABOLIC PANEL
Anion gap: 8 (ref 5–15)
BUN: 13 mg/dL (ref 8–23)
CO2: 29 mmol/L (ref 22–32)
Calcium: 8.9 mg/dL (ref 8.9–10.3)
Chloride: 104 mmol/L (ref 98–111)
Creatinine, Ser: 1.42 mg/dL — ABNORMAL HIGH (ref 0.44–1.00)
GFR calc Af Amer: 43 mL/min — ABNORMAL LOW (ref >60)
GFR calc non Af Amer: 37 mL/min — ABNORMAL LOW (ref >60)
Glucose, Bld: 154 mg/dL — ABNORMAL HIGH (ref 70–99)
Potassium: 3.9 mmol/L (ref 3.5–5.1)
Sodium: 141 mmol/L (ref 135–145)

## 2019-05-17 LAB — CBC
HCT: 39.7 % (ref 36.0–46.0)
Hemoglobin: 12.1 g/dL (ref 12.0–15.0)
MCH: 29.7 pg (ref 26.0–34.0)
MCHC: 30.5 g/dL (ref 30.0–36.0)
MCV: 97.3 fL (ref 80.0–100.0)
Platelets: 55 10*3/uL — ABNORMAL LOW (ref 150–400)
RBC: 4.08 MIL/uL (ref 3.87–5.11)
RDW: 16.8 % — ABNORMAL HIGH (ref 11.5–15.5)
WBC: 5.1 10*3/uL (ref 4.0–10.5)
nRBC: 0 % (ref 0.0–0.2)

## 2019-05-17 LAB — DIFFERENTIAL
Abs Immature Granulocytes: 0.03 10*3/uL (ref 0.00–0.07)
Basophils Absolute: 0 10*3/uL (ref 0.0–0.1)
Basophils Relative: 0 %
Eosinophils Absolute: 0.2 10*3/uL (ref 0.0–0.5)
Eosinophils Relative: 3 %
Immature Granulocytes: 1 %
Lymphocytes Relative: 13 %
Lymphs Abs: 0.6 10*3/uL — ABNORMAL LOW (ref 0.7–4.0)
Monocytes Absolute: 0.5 10*3/uL (ref 0.1–1.0)
Monocytes Relative: 10 %
Neutro Abs: 3.6 10*3/uL (ref 1.7–7.7)
Neutrophils Relative %: 73 %

## 2019-05-17 LAB — LIPASE, BLOOD: Lipase: 37 U/L (ref 11–51)

## 2019-05-17 LAB — TROPONIN I (HIGH SENSITIVITY)
Troponin I (High Sensitivity): 9 ng/L (ref <18)
Troponin I (High Sensitivity): 10 ng/L (ref <18)

## 2019-05-17 SURGERY — PHACOEMULSIFICATION, CATARACT, WITH IOL INSERTION
Anesthesia: Monitor Anesthesia Care | Site: Eye | Laterality: Right

## 2019-05-17 MED ORDER — EPINEPHRINE PF 1 MG/ML IJ SOLN
INTRAOCULAR | Status: DC | PRN
  Administered 2019-05-17: 10:00:00 500 mL

## 2019-05-17 MED ORDER — EPINEPHRINE PF 1 MG/ML IJ SOLN
INTRAMUSCULAR | Status: AC
  Filled 2019-05-17: qty 2

## 2019-05-17 MED ORDER — TETRACAINE HCL 0.5 % OP SOLN
1.0000 [drp] | OPHTHALMIC | Status: AC | PRN
  Administered 2019-05-17 (×3): 1 [drp] via OPHTHALMIC

## 2019-05-17 MED ORDER — SODIUM HYALURONATE 23 MG/ML IO SOLN
INTRAOCULAR | Status: DC | PRN
  Administered 2019-05-17: 0.6 mL via INTRAOCULAR

## 2019-05-17 MED ORDER — MIDAZOLAM HCL 2 MG/2ML IJ SOLN
INTRAMUSCULAR | Status: DC | PRN
  Administered 2019-05-17: 2 mg via INTRAVENOUS

## 2019-05-17 MED ORDER — PROMETHAZINE HCL 25 MG/ML IJ SOLN
6.2500 mg | Freq: Once | INTRAMUSCULAR | Status: AC
  Administered 2019-05-17: 6.25 mg via INTRAVENOUS
  Filled 2019-05-17: qty 1

## 2019-05-17 MED ORDER — LIDOCAINE HCL 3.5 % OP GEL
1.0000 "application " | Freq: Once | OPHTHALMIC | Status: AC
  Administered 2019-05-17: 1 via OPHTHALMIC

## 2019-05-17 MED ORDER — MIDAZOLAM HCL 2 MG/2ML IJ SOLN
INTRAMUSCULAR | Status: AC
  Filled 2019-05-17: qty 2

## 2019-05-17 MED ORDER — PHENYLEPHRINE HCL 2.5 % OP SOLN
1.0000 [drp] | OPHTHALMIC | Status: AC | PRN
  Administered 2019-05-17 (×3): 1 [drp] via OPHTHALMIC

## 2019-05-17 MED ORDER — PROVISC 10 MG/ML IO SOLN
INTRAOCULAR | Status: DC | PRN
  Administered 2019-05-17: 0.85 mL via INTRAOCULAR

## 2019-05-17 MED ORDER — CYCLOPENTOLATE-PHENYLEPHRINE 0.2-1 % OP SOLN
1.0000 [drp] | OPHTHALMIC | Status: AC | PRN
  Administered 2019-05-17 (×3): 1 [drp] via OPHTHALMIC

## 2019-05-17 MED ORDER — LIDOCAINE HCL (PF) 1 % IJ SOLN
INTRAOCULAR | Status: DC | PRN
  Administered 2019-05-17: 10:00:00 1 mL via OPHTHALMIC

## 2019-05-17 MED ORDER — POVIDONE-IODINE 5 % OP SOLN
OPHTHALMIC | Status: DC | PRN
  Administered 2019-05-17: 1 via OPHTHALMIC

## 2019-05-17 MED ORDER — IOHEXOL 350 MG/ML SOLN
75.0000 mL | Freq: Once | INTRAVENOUS | Status: AC | PRN
  Administered 2019-05-17: 75 mL via INTRAVENOUS

## 2019-05-17 MED ORDER — BSS IO SOLN
INTRAOCULAR | Status: DC | PRN
  Administered 2019-05-17: 15 mL via INTRAOCULAR

## 2019-05-17 MED ORDER — NEOMYCIN-POLYMYXIN-DEXAMETH 3.5-10000-0.1 OP SUSP
OPHTHALMIC | Status: DC | PRN
  Administered 2019-05-17: 1 [drp] via OPHTHALMIC

## 2019-05-17 MED ORDER — MORPHINE SULFATE (PF) 2 MG/ML IV SOLN
2.0000 mg | Freq: Once | INTRAVENOUS | Status: AC
  Administered 2019-05-17: 2 mg via INTRAVENOUS
  Filled 2019-05-17: qty 1

## 2019-05-17 MED ORDER — DIPHENHYDRAMINE HCL 50 MG/ML IJ SOLN
INTRAMUSCULAR | Status: AC
  Administered 2019-05-17: 12.5 mg via INTRAVENOUS
  Filled 2019-05-17: qty 1

## 2019-05-17 SURGICAL SUPPLY — 16 items
CLOTH BEACON ORANGE TIMEOUT ST (SAFETY) ×1 IMPLANT
DEVICE MILOOP (MISCELLANEOUS) IMPLANT
EYE SHIELD UNIVERSAL CLEAR (GAUZE/BANDAGES/DRESSINGS) ×1 IMPLANT
GLOVE BIOGEL PI IND STRL 7.0 (GLOVE) IMPLANT
GLOVE BIOGEL PI INDICATOR 7.0 (GLOVE) ×2
LENS ALC ACRYL/TECN (Ophthalmic Related) ×1 IMPLANT
MILOOP DEVICE (MISCELLANEOUS)
NDL HYPO 18GX1.5 BLUNT FILL (NEEDLE) IMPLANT
NEEDLE HYPO 18GX1.5 BLUNT FILL (NEEDLE) ×2 IMPLANT
PAD ARMBOARD 7.5X6 YLW CONV (MISCELLANEOUS) ×1 IMPLANT
RING MALYGIN (MISCELLANEOUS) IMPLANT
RING MALYGIN 7.0 (MISCELLANEOUS) IMPLANT
SYR TB 1ML LL NO SAFETY (SYRINGE) ×1 IMPLANT
TAPE PAPER 3X10 WHT MICROPORE (GAUZE/BANDAGES/DRESSINGS) ×1 IMPLANT
VISCOELASTIC ADDITIONAL (OPHTHALMIC RELATED) ×1 IMPLANT
WATER STERILE IRR 250ML POUR (IV SOLUTION) ×1 IMPLANT

## 2019-05-17 NOTE — ED Notes (Signed)
Samantha Free, PA has explained to pt that she will need another IV started for the CT scan, as the current IV is to small for this scan. PT is onboard with this plan per Idol, PA

## 2019-05-17 NOTE — Discharge Instructions (Addendum)
Please discharge patient when stable, will follow up today with Dr. Marisa Hua at the [facility] office immediately following discharge.  Leave shield in place until visit.  All paperwork with discharge instructions will be given at the office.  [facility] Address:  [place], [place] [address]   PATIENT INSTRUCTIONS POST-ANESTHESIA  IMMEDIATELY FOLLOWING SURGERY:  Do not drive or operate machinery for the first twenty four hours after surgery.  Do not make any important decisions for twenty four hours after surgery or while taking narcotic pain medications or sedatives.  If you develop intractable nausea and vomiting or a severe headache please notify your doctor immediately.  FOLLOW-UP:  Please make an appointment with your surgeon as instructed. You do not need to follow up with anesthesia unless specifically instructed to do so.  WOUND CARE INSTRUCTIONS (if applicable):  Keep a dry clean dressing on the anesthesia/puncture wound site if there is drainage.  Once the wound has quit draining you may leave it open to air.  Generally you should leave the bandage intact for twenty four hours unless there is drainage.  If the epidural site drains for more than 36-48 hours please call the anesthesia department.  QUESTIONS?:  Please feel free to call your physician or the hospital operator if you have any questions, and they will be happy to assist you.

## 2019-05-17 NOTE — Anesthesia Postprocedure Evaluation (Signed)
Anesthesia Post Note  Patient: Samantha [name]  Procedure(s) Performed: CATARACT EXTRACTION PHACO AND INTRAOCULAR LENS PLACEMENT (IOC) (CDE: 14.99) (Right Eye)  Patient location during evaluation: PACU Anesthesia Type: MAC Level of consciousness: awake and alert, awake, patient cooperative and oriented Pain management: pain level controlled Vital Signs Assessment: post-procedure vital signs reviewed and stable Respiratory status: spontaneous breathing, respiratory function stable and nonlabored ventilation Cardiovascular status: stable Postop Assessment: no apparent nausea or vomiting Anesthetic complications: no     Last Vitals:  Vitals:   05/17/19 0850  BP: (!) 124/56  Pulse: 81  Resp: (!) 22  Temp: 36.6 C  SpO2: 92%    Last Pain:  Vitals:   05/17/19 0850  TempSrc: Oral  PainSc: 0-No pain                 Jaymie Mckiddy

## 2019-05-17 NOTE — ED Provider Notes (Signed)
[facility] EMERGENCY DEPARTMENT Provider Note   CSN: [id number] Arrival date & time: 05/17/19  1646     History Chief Complaint  Patient presents with  . Chest Pain    Samantha [name] is a 69 y.o. female with a history as outlined below, most significant for NASH with esophageal varices with prior history of banding, ischemic cardiomyopathy, atrial fibrillation on Eliquis, CAD with CABG in 2001, history of CHF, had right eye cataract surgery this morning.  She was resting at home, stating she was sitting in her chair around 2 PM which she had sudden onset of epigastric and midsternal pain which radiated into her mid back, described as sharp constant and lasted for 2 hours.  She also endorses nausea with emesis x1, describing bilious nonbloody emesis after which her pain improved.  She is still experiencing occasional fleeting episodes of pain but much less severe than when it first began.  She last ate dinner yesterday evening in anticipation of this morning surgery.  She states her symptoms are not consistent with prior MI, stating she never had chest pain.  She took 4 baby aspirin at home, followed by nitroglycerin x2, after she had no relief she then took 4 Tums tablets with no relief.  Her symptoms resolved spontaneously during transport.  She does report she was told she has a gallbladder issue based on a recent ultrasound study.  Pertinent negatives including no shortness of breath, lightheadedness, palpitations, diaphoresis, also no fevers or chills, no diarrhea or dysuria.  Review of the view of the chart indicates she had an ultrasound just 3 days ago with stable cirrhosis, slight increased splenomegaly and gallbladder sludge with mild gallbladder wall thickening which was felt to be related to her underlying liver disease.  The history is provided by the patient.       Past Medical History:  Diagnosis Date  . Cataract    OU  . Chronic combined systolic and diastolic CHF  (congestive heart failure) ([place])   . Cirrhosis of liver ([place])   . CKD (chronic kidney disease), stage III   . Coronary artery disease    a. s/p CABG 2001 w/ (LIMA-OM, SVG-D1, SVG-RCA). b. h/o multiple PCIs per Dr. Antionette Char note.  . Depression   . Esophageal varices ([place])    New 2013  . Gastroesophageal reflux disease   . History of pneumonia   . Hyperlipidemia   . Hypertension   . Hypertensive retinopathy    OU  . Obesity   . Osteoarthritis   . Pancytopenia ([place])   . Paroxysmal atrial flutter ([place])    a. dx 05/2016.  [name] Persistent atrial fibrillation ([place])    a. reported in hosp 07/2016, not on anticoag due to cirrhosis and liver disease, low platelets, varices.  . Thrombocytopenia [facility])     Patient Active Problem List   Diagnosis Date Noted  . Iron deficiency anemia 02/05/2019  . Chronic combined systolic and diastolic CHF (congestive heart failure) ([place]) 04/17/2018  . Heart failure ([place]) 04/17/2018  . Osteoarthritis of spine with radiculopathy, cervical region 01/12/2018  . Paresthesia of both hands 12/14/2017  . Recurrent falls 12/14/2017  . Closed compression fracture of first lumbar vertebra ([place]) 08/23/2017  . Lumbar spondylosis with myelopathy 08/23/2017  . Carpal tunnel syndrome of right wrist 04/05/2017  . Rectal pain 03/06/2017  . Influenza A (H1N1) 02/22/2017  . Rectal bleeding 02/22/2017  . Closed fracture of lower end of right radius with routine healing 02/20/2017  . Injury  of triangular fibrocartilage complex of right wrist 02/20/2017  . Pain 02/10/2017  . Other cirrhosis of liver ([place]) 10/26/2016  . Persistent atrial fibrillation 08/16/2016  . Cardiomyopathy, ischemic-35-40% by cath  01/11/2015  . Obesity 09/24/2013  . Unspecified hereditary and idiopathic peripheral neuropathy 01/10/2013  . Hereditary and idiopathic peripheral neuropathy 01/10/2013  . Cirrhosis, non-alcoholic ([place]) 89/38/1017  . Esophageal varices- Plavix stopped 12/23/2011  . GI bleed  12/23/2011  . Idiopathic esophageal varices without bleeding ([place]) 12/23/2011  . Anemia 04/22/2011  . CAD (coronary artery disease) 08/21/2009  . Mixed hyperlipidemia 06/04/2008  . Essential hypertension 06/04/2008  . S/P CABG x 3- 2001 06/04/2008    Past Surgical History:  Procedure Laterality Date  . ABDOMINAL HYSTERECTOMY    . BACK SURGERY    . CARDIAC CATHETERIZATION  2004   left internal mammary artery to the obtuse marginal  was found to be small and thread like.  The two grafts were patent.  The left circumflex had 90% in-stent restenosis and cutting balloon angioplasty was performed followed by placement of a 3.0 x 79m Taxus drug -eluting stent.    .Marland KitchenCARDIAC CATHETERIZATION  2006   There was in-stent restenosis in the left circumflex and this was treated with cutting balloon angioplasty   . CARDIAC CATHETERIZATION  2008   vein graft to the to the obtuse marginal was patent, although small, left circumflex had 40% in-stent restenosis, ejection fraction 40-45%.  The patient was medically mananged.  .Marland KitchenCARDIAC CATHETERIZATION N/A 01/09/2015   Procedure: Left Heart Cath and Cors/Grafts Angiography;  Surgeon: HBelva Crome MD;  [facility];  Service: Cardiovascular;  Laterality: N/A;  . cataract sx    . COLONOSCOPY  03/29/2011   Procedure: COLONOSCOPY;  Surgeon: MJamesetta So MD;  Location: [facility] END[facility] SU[facility];  Service: Gastroenterology;  Laterality: N/A;  . CORONARY ARTERY BYPASS GRAFT  May 31,2001   x 3 with a vein graft to the first diagonal, vein graft to the right coronary  artery, and a free left internal mammary  artery to the obtuse marginal   . ESOPHAGEAL BANDING N/A 07/04/2012   Procedure: ESOPHAGEAL BANDING;  Surgeon: NRogene Houston MD;  Location: [facility];  Service: Endoscopy;  Laterality: N/A;  . ESOPHAGEAL BANDING N/A 09/17/2012   Procedure: ESOPHAGEAL BANDING;  Surgeon: NRogene Houston MD;  [facility];  Service: Endoscopy;  Laterality:  N/A;  . ESOPHAGEAL BANDING N/A 10/22/2013   Procedure: ESOPHAGEAL BANDING;  Surgeon: NRogene Houston MD;  [facility]: [facility];  Service: Endoscopy;  Laterality: N/A;  . ESOPHAGEAL BANDING N/A 11/28/2014   Procedure: ESOPHAGEAL BANDING;  Surgeon: NRogene Houston MD;  [facility];  Service: Endoscopy;  Laterality: N/A;  . ESOPHAGEAL BANDING N/A 10/29/2015   Procedure: ESOPHAGEAL BANDING;  Surgeon: NRogene Houston MD;  [facility]: [facility];  Service: Endoscopy;  Laterality: N/A;  . ESOPHAGOGASTRODUODENOSCOPY  12/20/2011   Procedure: ESOPHAGOGASTRODUODENOSCOPY (EGD);  Surgeon: MJamesetta So MD;  [facility];  Service: Gastroenterology;  Laterality: N/A;  . ESOPHAGOGASTRODUODENOSCOPY N/A 07/04/2012   Procedure: ESOPHAGOGASTRODUODENOSCOPY (EGD);  Surgeon: NRogene Houston MD;  [facility];  Service: Endoscopy;  Laterality: N/A;  235-moved to 255 Ann to notify pt  . ESOPHAGOGASTRODUODENOSCOPY N/A 09/17/2012   Procedure: ESOPHAGOGASTRODUODENOSCOPY (EGD);  Surgeon: NRogene Houston MD;  [facility];  Service: Endoscopy;  Laterality: N/A;  730  . ESOPHAGOGASTRODUODENOSCOPY N/A 10/22/2013   Procedure:  ESOPHAGOGASTRODUODENOSCOPY (EGD);  Surgeon: Rogene Houston, MD;  [facility];  Service: Endoscopy;  Laterality: N/A;  730  . ESOPHAGOGASTRODUODENOSCOPY N/A 11/28/2014   Procedure: ESOPHAGOGASTRODUODENOSCOPY (EGD);  Surgeon: Rogene Houston, MD;  [facility];  Service: Endoscopy;  Laterality: N/A;  1:25  . ESOPHAGOGASTRODUODENOSCOPY N/A 10/29/2015   Procedure: ESOPHAGOGASTRODUODENOSCOPY (EGD);  Surgeon: Rogene Houston, MD;  [facility];  Service: Endoscopy;  Laterality: N/A;  12:00  . ESOPHAGOGASTRODUODENOSCOPY N/A 12/12/2016   Procedure: ESOPHAGOGASTRODUODENOSCOPY (EGD);  Surgeon: Rogene Houston, MD;  [facility];  Service: Endoscopy;  Laterality: N/A;  225  . FLEXIBLE SIGMOIDOSCOPY N/A 03/20/2017    Procedure: FLEXIBLE SIGMOIDOSCOPY;  Surgeon: Rogene Houston, MD;  [facility];  Service: Endoscopy;  Laterality: N/A;  7:30  . JOINT REPLACEMENT    . LEFT HEART CATH AND CORS/GRAFTS ANGIOGRAPHY N/A 08/15/2016   Procedure: Left Heart Cath and Cors/Grafts Angiography;  Surgeon: Jettie Booze, MD;  Location: [place] [facility] LAB;  Service: Cardiovascular;  Laterality: N/A;  . LEFT HEART CATHETERIZATION WITH CORONARY/GRAFT ANGIOGRAM N/A 12/25/2013   Procedure: LEFT HEART CATHETERIZATION WITH Beatrix Fetters;  Surgeon: Blane Ohara, MD;  Location: [facility] CATH LAB;  Service: Cardiovascular;  Laterality: N/A;  . RIGHT HEART CATH N/A 05/29/2017   Procedure: RIGHT HEART CATH;  Surgeon: Jolaine Artist, MD;  Location: [place] CV LAB;  Service: Cardiovascular;  Laterality: N/A;  . rotator cuff left  2007  . TONSILLECTOMY       OB History   No obstetric history on file.     Family History  Problem Relation Age of Onset  . Heart attack Mother   . Diabetes Mother   . Heart attack Father 67       cause of death  . Diabetes Father   . Coronary artery disease Sister        CABG   . Diabetes Sister   . Glaucoma Sister   . Diabetes Brother   . Colon cancer Neg Hx     Social History   Tobacco Use  . Smoking status: Former Smoker    Packs/day: 0.50    Years: 20.00    Pack years: 10.00    Types: Cigarettes    Quit date: 07/21/1995    Years since quitting: 23.8  . Smokeless tobacco: Never Used  Substance Use Topics  . Alcohol use: No  . Drug use: No    Home Medications Prior to Admission medications   Medication Sig Start Date End Date Taking? Authorizing Provider  albuterol (PROVENTIL HFA;VENTOLIN HFA) 108 (90 BASE) MCG/ACT inhaler Inhale 2 puffs into the lungs every 6 (six) hours as needed for wheezing or shortness of breath.     [provider]  ezetimibe (ZETIA) 10 MG tablet Take 10 mg by mouth at bedtime.     [provider]    FLUoxetine (PROZAC) 20 MG capsule Take 20 mg by mouth daily.     [provider]  HYDROcodone-acetaminophen (NORCO) 10-325 MG tablet Take 1 tablet by mouth every 6 (six) hours as needed for moderate pain.  11/06/17   [provider]  hydroxypropyl methylcellulose / hypromellose (ISOPTO TEARS / GONIOVISC) 2.5 % ophthalmic solution Place 1 drop into both eyes 3 (three) times daily as needed for dry eyes.    [provider]  isosorbide mononitrate (IMDUR) 60 MG 24 hr tablet Take 1.5 tablets (90 mg total) by mouth daily. 04/18/19 04/12/20  Rolland Porter, MD  metoprolol succinate (TOPROL XL) 25 MG 24 hr tablet Take 1 tablet (25 mg total) by mouth daily. 03/14/18   Weaver, Scott T, PA-C  NITROSTAT 0.4 MG SL tablet Place 0.4 mg under the tongue every 5 (five) minutes as needed for chest pain (x 3 tabs daily).  11/15/13   [provider]  pantoprazole (PROTONIX) 40 MG tablet Take 40 mg by mouth daily.    [provider]  polyethylene glycol (MIRALAX / GLYCOLAX) packet Take 17 g by mouth daily.    [provider]  spironolactone (ALDACTONE) 25 MG tablet Take 1 tablet (25 mg total) by mouth daily. 08/01/18 07/27/19  Sherren Mocha, MD  torsemide (DEMADEX) 20 MG tablet Take 2 tablets (40 mg total) by mouth daily. 11/15/17 05/10/19  Sherren Mocha, MD  valsartan (DIOVAN) 160 MG tablet Take 1 tablet (160 mg total) by mouth at bedtime. 06/15/17   Shirley Friar, PA-C    Allergies    Entresto [sacubitril-valsartan], Acetaminophen, Oxycodone, Ace inhibitors, Cefaclor, Cephalexin, Penicillins, Pregabalin, and Tape  Review of Systems   Review of Systems  Constitutional: Negative for fever.  HENT: Negative for congestion and sore throat.   Eyes: Negative.   Respiratory: Negative for chest tightness and shortness of breath.   Cardiovascular: Positive for chest pain.  Gastrointestinal: Positive for abdominal pain, nausea and vomiting.  Genitourinary: Negative.    Musculoskeletal: Negative for arthralgias, joint swelling and neck pain.  Skin: Negative.  Negative for rash and wound.  Neurological: Negative for dizziness, weakness, light-headedness, numbness and headaches.  Psychiatric/Behavioral: Negative.     Physical Exam Updated Vital Signs BP (!) 113/48   Pulse 77   Temp 98 F (36.7 C)   Resp 14   Ht 5' 3"  (1.6 m)   Wt 81 kg   LMP 03/29/2011   SpO2 95%   BMI 31.63 kg/m   Physical Exam Vitals and nursing note reviewed.  Constitutional:      Appearance: She is well-developed.  HENT:     Head: Normocephalic and atraumatic.  Eyes:     Conjunctiva/sclera: Conjunctivae normal.     Comments: Right eye shield in place.  Cardiovascular:     Rate and Rhythm: Normal rate and regular rhythm.     Heart sounds: Normal heart sounds.  Pulmonary:     Effort: Pulmonary effort is normal.     Breath sounds: Normal breath sounds. No wheezing.  Chest:     Chest wall: No tenderness.  Abdominal:     General: Bowel sounds are normal.     Palpations: Abdomen is soft. There is no pulsatile mass.     Tenderness: There is abdominal tenderness in the epigastric area. There is no guarding. Negative signs include Murphy's sign.  Musculoskeletal:        General: Normal range of motion.     Cervical back: Normal range of motion.  Skin:    General: Skin is warm and dry.  Neurological:     Mental Status: She is alert.     ED Results / Procedures / Treatments   Labs (all labs ordered are listed, but only abnormal results are displayed) Labs Reviewed  BASIC METABOLIC PANEL - Abnormal; Notable for the following components:      Result Value   Glucose, Bld 154 (*)    Creatinine, Ser 1.42 (*)    GFR calc non Af Amer 37 (*)    GFR calc Af Amer 43 (*)    All  other components within normal limits  CBC - Abnormal; Notable for the following components:   RDW 16.8 (*)    Platelets 55 (*)    All other components within normal limits  DIFFERENTIAL -  Abnormal; Notable for the following components:   Lymphs Abs 0.6 (*)    All other components within normal limits  HEPATIC FUNCTION PANEL - Abnormal; Notable for the following components:   Albumin 3.0 (*)    AST 52 (*)    Total Bilirubin 2.5 (*)    Bilirubin, Direct 0.9 (*)    Indirect Bilirubin 1.6 (*)    All other components within normal limits  LIPASE, BLOOD  TROPONIN I (HIGH SENSITIVITY)  TROPONIN I (HIGH SENSITIVITY)    EKG EKG Interpretation  Date/Time:  Friday May 17 2019 17:03:39 EDT Ventricular Rate:  62 PR Interval:    QRS Duration: 153 QT Interval:  527 QTC Calculation: 536 R Axis:   104 Text Interpretation: Junctional rhythm Consider left ventricular hypertrophy Prolonged QT interval Confirmed by Milton Ferguson [phone]) on 05/17/2019 7:50:27 PM   Radiology DG Chest 2 View  Result Date: 05/17/2019 CLINICAL DATA:  Chest pain EXAM: CHEST - 2 VIEW COMPARISON:  04/18/2019 FINDINGS: The heart size remains significantly enlarged. The patient is status post prior median sternotomy. There is a stable linear area of scarring/atelectasis in the left mid lung zone. There is vascular congestion without overt pulmonary edema. There is no large pleural effusion. Aortic calcifications are noted. The patient is status post prior vertebral augmentation of the L1 vertebral body. Osteopenia is noted throughout the thoracic spine. There is no acute displaced fracture or dislocation. IMPRESSION: Cardiomegaly with vascular congestion. Electronically Signed   By: Constance Holster M.D.   On: 05/17/2019 17:41   CT ANGIO CHEST AORTA W/CM & OR WO/CM  Result Date: 05/17/2019 CLINICAL DATA:  Chest pain radiating to back EXAM: CT ANGIOGRAPHY CHEST WITH CONTRAST TECHNIQUE: Multidetector CT imaging of the chest was performed using the standard protocol during bolus administration of intravenous contrast. Multiplanar CT image reconstructions and MIPs were obtained to evaluate the vascular anatomy.  CONTRAST:  66m OMNIPAQUE IOHEXOL 350 MG/ML SOLN COMPARISON:  03/25/2008 FINDINGS: Cardiovascular: Cardiomegaly. Prior CABG. Diffuse aortic atherosclerosis. No aneurysm or dissection. No central pulmonary embolus. Mediastinum/Nodes: No mediastinal, hilar, or axillary adenopathy. Trachea and esophagus are unremarkable. Thyroid unremarkable. Lungs/Pleura: Areas of scarring in the lungs bilaterally. No confluent airspace opacities or effusions. Upper Abdomen: Nodular surface of the liver compatible with cirrhosis. Associated splenomegaly, partially imaged. Upper abdominal and and lower esophageal varices noted. Musculoskeletal: Chest wall soft tissues are unremarkable. No acute bony abnormality. Review of the MIP images confirms the above findings. IMPRESSION: Diffuse aortic atherosclerosis.  No aneurysm or dissection. Cardiomegaly, prior CABG. Areas of scarring in the lungs. No acute cardiopulmonary disease. Changes of cirrhosis with upper abdominal varices and splenomegaly. Electronically Signed   By: KRolm BaptiseM.D.   On: 05/17/2019 21:26    Procedures Procedures (including critical care time)  Medications Ordered in ED Medications  morphine 2 MG/ML injection 2 mg (2 mg Intravenous Given 05/17/19 2036)  promethazine (PHENERGAN) injection 6.25 mg (6.25 mg Intravenous Given 05/17/19 2035)  diphenhydrAMINE (BENADRYL) 50 MG/ML injection (12.5 mg Intravenous Given 05/17/19 2048)  iohexol (OMNIPAQUE) 350 MG/ML injection 75 mL (75 mLs Intravenous Contrast Given 05/17/19 2109)    ED Course  I have reviewed the triage vital signs and the nursing notes.  Pertinent labs & imaging results that were available during my care  of the patient were reviewed by me and considered in my medical decision making (see chart for details).    MDM Rules/Calculators/A&P                      Pt with epigastric and midsternal chest pain with radiation into her mid back, not associated with sob, fevers but with nausea and  emesis x1.  She had ultrasound imaging this week for cirrhosis monitoring, ultrasound was positive for gallbladder sludge and mild gallbladder wall edema, felt to be secondary to chronic cirrhosis changes.  However, her AST is slightly elevated at 52, it was 43 just 2 days ago.  It is possible her symptoms are caused by gallbladder dysfunction.  She has a normal white blood cell count here and her symptoms were mild during her ED stay.  Given the radiation into her back, CT imaging was performed to rule out aortic dissection, the study was reassuring.  Patient has hydrocodone at home for as needed orthopedic pain relief.  She was encouraged to take this medication if she has another episode of pain.   Discussed close follow-up with Dr. Laural Golden, she may require HIDA scan to further evaluate for gallbladder dysfunction.  She was given strict return precautions if symptoms return or worsen.  Her delta troponins are stable.  Doubt ACS or MI. Final Clinical Impression(s) / ED Diagnoses Final diagnoses:  Epigastric pain  Atypical chest pain    Rx / DC Orders ED Discharge Orders    None       Landis Martins 05/17/19 2203    Milton Ferguson, MD 05/21/19 1031

## 2019-05-17 NOTE — ED Notes (Signed)
Patient transported to CT 

## 2019-05-17 NOTE — Discharge Instructions (Addendum)
Your labs, imaging and exam along with the ultrasound of your abdomen you had earlier this week suggests todays pain event may have been a gallbladder attack, possibly a passed gallstone or gallbladder spasm.  You may need a further test called a HIDA scan to further evaluate your gallbladder.  Call Dr. Laural Golden to further assess this possibility.  In the meantime, avoid fatty food intake as consumption of fatty foods will trigger your gallbladder to spasm.  Use your hydrocodone if you develop another episode.  Return here for any worsened pain, uncontrolled vomiting or if you develop any fevers or new symptoms.

## 2019-05-17 NOTE — Transfer of Care (Signed)
Immediate Anesthesia Transfer of Care Note  Patient: Samantha [name]  Procedure(s) Performed: CATARACT EXTRACTION PHACO AND INTRAOCULAR LENS PLACEMENT (IOC) (CDE: 14.99) (Right Eye)  Patient Location: PACU  Anesthesia Type:MAC  Level of Consciousness: awake, alert  and oriented  Airway & Oxygen Therapy: Patient Spontanous Breathing  Post-op Assessment: Report given to RN and Post -op Vital signs reviewed and stable  Post vital signs: Reviewed and stable  Last Vitals:  Vitals Value Taken Time  BP    Temp    Pulse    Resp    SpO2      Last Pain:  Vitals:   05/17/19 0850  TempSrc: Oral  PainSc: 0-No pain      Patients Stated Pain Goal: 8 (62/03/55 9741)  Complications: No apparent anesthesia complications

## 2019-05-17 NOTE — ED Triage Notes (Signed)
Pt reports that she had cataract sx this morning and was at home in her chair and chest began hurting . Pt took 2 nitro and baby asa x 4. Pain is more in epigastric region. Reports she vomited. States pain was very severe

## 2019-05-17 NOTE — Op Note (Signed)
Date of procedure: 05/17/19  Pre-operative diagnosis: Visually significant age-related nuclear cataract, Right Eye (H25.11)  Post-operative diagnosis: Visually significant age-related nuclear cataract, Right Eye  Procedure: Removal of cataract via phacoemulsification and insertion of intra-ocular lens [name] DCB00  +24.5D into the capsular bag of the Right Eye  Attending surgeon: Gerda Diss. Sylvi Rybolt, MD, MA  Anesthesia: MAC, Topical Akten  Complications: None  Estimated Blood Loss: <32m (minimal)  Specimens: None  Implants: As above  Indications:  Visually significant age-related cataract, Right Eye  Procedure:  The patient was seen and identified in the pre-operative area. The operative eye was identified and dilated.  The operative eye was marked.  Topical anesthesia was administered to the operative eye.     The patient was then to the operative suite and placed in the supine position.  A timeout was performed confirming the patient, procedure to be performed, and all other relevant information.   The patient's face was prepped and draped in the usual fashion for intra-ocular surgery.  A lid speculum was placed into the operative eye and the surgical microscope moved into place and focused.  A superotemporal paracentesis was created using a 20 gauge paracentesis blade.  Shugarcaine was injected into the anterior chamber.  Viscoelastic was injected into the anterior chamber.  A temporal clear-corneal main wound incision was created using a 2.4107mmicrokeratome.  A continuous curvilinear capsulorrhexis was initiated using an irrigating cystitome and completed using capsulorrhexis forceps.  Hydrodissection and hydrodeliniation were performed.  Viscoelastic was injected into the anterior chamber.  A phacoemulsification handpiece and a chopper as a second instrument were used to remove the nucleus and epinucleus. The irrigation/aspiration handpiece was used to remove any  remaining cortical material.   The capsular bag was reinflated with viscoelastic, checked, and found to be intact.  The intraocular lens was inserted into the capsular bag.  The irrigation/aspiration handpiece was used to remove any remaining viscoelastic.  The clear corneal wound and paracentesis wounds were then hydrated and checked with Weck-Cels to be watertight.  The lid-speculum and drape was removed, and the patient's face was cleaned with a wet and dry 4x4.  Maxitrol was instilled in the eye before a clear shield was taped over the eye. The patient was taken to the post-operative care unit in good condition, having tolerated the procedure well.  Post-Op Instructions: [facility] and will receive all other orders and instructions.

## 2019-05-17 NOTE — ED Notes (Signed)
Returned from xray

## 2019-05-17 NOTE — ED Notes (Signed)
ED Provider at bedside. 

## 2019-05-17 NOTE — Anesthesia Preprocedure Evaluation (Signed)
Anesthesia Evaluation  Patient identified by MRN, date of birth, ID band Patient awake    Reviewed: Allergy & Precautions, NPO status , Patient's Chart, lab work & pertinent test results, reviewed documented beta blocker date and time   Airway Mallampati: II  TM Distance: >3 FB Neck ROM: Full    Dental  (+) Lower Dentures, Upper Dentures   Pulmonary shortness of breath and with exertion, COPD,  COPD inhaler, former smoker,    Pulmonary exam normal breath sounds clear to auscultation       Cardiovascular Exercise Tolerance: Poor hypertension, Pt. on medications and Pt. on home beta blockers + CAD, + Past MI, + Cardiac Stents, + CABG and +CHF   Rhythm:Irregular Rate:Normal  Impressions:   - Compared to a prior study in 04/2017, the LVEF is lower at 40-45%  with inferior and lateral wall hypokinesis, grade 2 DD and  elevated LV filling pressure.   17-Apr-2019 23:28:54 [facility] ROUTINE RECORD Sinus rhythm Prolonged PR interval Consider left ventricular hypertrophy Abnormal T, consider ischemia, lateral leads Prolonged QT interval No significant change since last tracing 17 Apr 2018 Confirmed by Rolland Porter [phone]) on 04/18/2019 12:36:27 AM   Neuro/Psych PSYCHIATRIC DISORDERS Depression  Neuromuscular disease    GI/Hepatic GERD  Medicated and Controlled,(+) Cirrhosis   Esophageal Varices    ,   Endo/Other    Renal/GU Renal disease     Musculoskeletal  (+) Arthritis ,   Abdominal   Peds  Hematology  (+) anemia ,   Anesthesia Other Findings   Reproductive/Obstetrics                             Anesthesia Physical Anesthesia Plan  ASA: IV  Anesthesia Plan: MAC   Post-op Pain Management:    Induction:   PONV Risk Score and Plan:   Airway Management Planned: Nasal Cannula and Natural Airway  Additional Equipment:   Intra-op Plan:   Post-operative  Plan:   Informed Consent: I have reviewed the patients History and Physical, chart, labs and discussed the procedure including the risks, benefits and alternatives for the proposed anesthesia with the patient or authorized representative who has indicated his/her understanding and acceptance.       Plan Discussed with: CRNA and Surgeon  Anesthesia Plan Comments:         Anesthesia Quick Evaluation

## 2019-05-17 NOTE — Interval H&P Note (Signed)
History and Physical Interval Note: The H and P was reviewed and updated. The patient was examined.  No changes were found after exam.  The surgical eye was marked.  05/17/2019 9:30 AM  Samantha [name]  has presented today for surgery, with the diagnosis of nuclear cataract right eye.  The various methods of treatment have been discussed with the patient and family. After consideration of risks, benefits and other options for treatment, the patient has consented to  Procedure(s): CATARACT EXTRACTION PHACO AND INTRAOCULAR LENS PLACEMENT ([place]) (Right) as a surgical intervention.  The patient's history has been reviewed, patient examined, no change in status, stable for surgery.  I have reviewed the patient's chart and labs.  Questions were answered to the patient's satisfaction.     Baruch Goldmann

## 2019-05-17 NOTE — ED Notes (Signed)
Pt given ice chips

## 2019-05-19 ENCOUNTER — Emergency Department (HOSPITAL_COMMUNITY): Payer: Medicare Other

## 2019-05-19 ENCOUNTER — Other Ambulatory Visit: Payer: Self-pay

## 2019-05-19 ENCOUNTER — Encounter (HOSPITAL_COMMUNITY): Payer: Self-pay | Admitting: Emergency Medicine

## 2019-05-19 ENCOUNTER — Inpatient Hospital Stay (HOSPITAL_COMMUNITY)
Admission: EM | Admit: 2019-05-19 | Discharge: 2019-05-24 | DRG: 987 | Disposition: A | Discharge status: Home Health | Payer: Medicare Other | Attending: Internal Medicine | Admitting: Internal Medicine

## 2019-05-19 DIAGNOSIS — K766 Portal hypertension: Secondary | ICD-10-CM | POA: Diagnosis present

## 2019-05-19 DIAGNOSIS — I8510 Secondary esophageal varices without bleeding: Secondary | ICD-10-CM | POA: Diagnosis present

## 2019-05-19 DIAGNOSIS — K219 Gastro-esophageal reflux disease without esophagitis: Secondary | ICD-10-CM | POA: Diagnosis present

## 2019-05-19 DIAGNOSIS — Z8719 Personal history of other diseases of the digestive system: Secondary | ICD-10-CM

## 2019-05-19 DIAGNOSIS — K828 Other specified diseases of gallbladder: Secondary | ICD-10-CM | POA: Diagnosis present

## 2019-05-19 DIAGNOSIS — R1011 Right upper quadrant pain: Secondary | ICD-10-CM

## 2019-05-19 DIAGNOSIS — I5043 Acute on chronic combined systolic (congestive) and diastolic (congestive) heart failure: Secondary | ICD-10-CM | POA: Diagnosis not present

## 2019-05-19 DIAGNOSIS — J9621 Acute and chronic respiratory failure with hypoxia: Secondary | ICD-10-CM | POA: Diagnosis not present

## 2019-05-19 DIAGNOSIS — Z79899 Other long term (current) drug therapy: Secondary | ICD-10-CM

## 2019-05-19 DIAGNOSIS — Z833 Family history of diabetes mellitus: Secondary | ICD-10-CM

## 2019-05-19 DIAGNOSIS — H2511 Age-related nuclear cataract, right eye: Secondary | ICD-10-CM | POA: Diagnosis present

## 2019-05-19 DIAGNOSIS — D61818 Other pancytopenia: Secondary | ICD-10-CM | POA: Diagnosis present

## 2019-05-19 DIAGNOSIS — Z8249 Family history of ischemic heart disease and other diseases of the circulatory system: Secondary | ICD-10-CM

## 2019-05-19 DIAGNOSIS — I2510 Atherosclerotic heart disease of native coronary artery without angina pectoris: Secondary | ICD-10-CM | POA: Diagnosis present

## 2019-05-19 DIAGNOSIS — I498 Other specified cardiac arrhythmias: Secondary | ICD-10-CM

## 2019-05-19 DIAGNOSIS — K7469 Other cirrhosis of liver: Principal | ICD-10-CM | POA: Diagnosis present

## 2019-05-19 DIAGNOSIS — Z955 Presence of coronary angioplasty implant and graft: Secondary | ICD-10-CM

## 2019-05-19 DIAGNOSIS — I10 Essential (primary) hypertension: Secondary | ICD-10-CM | POA: Diagnosis present

## 2019-05-19 DIAGNOSIS — R1010 Upper abdominal pain, unspecified: Secondary | ICD-10-CM

## 2019-05-19 DIAGNOSIS — E869 Volume depletion, unspecified: Secondary | ICD-10-CM | POA: Diagnosis present

## 2019-05-19 DIAGNOSIS — N179 Acute kidney failure, unspecified: Secondary | ICD-10-CM | POA: Diagnosis not present

## 2019-05-19 DIAGNOSIS — Z6831 Body mass index (BMI) 31.0-31.9, adult: Secondary | ICD-10-CM

## 2019-05-19 DIAGNOSIS — R0789 Other chest pain: Secondary | ICD-10-CM | POA: Diagnosis present

## 2019-05-19 DIAGNOSIS — Z7901 Long term (current) use of anticoagulants: Secondary | ICD-10-CM

## 2019-05-19 DIAGNOSIS — K7460 Unspecified cirrhosis of liver: Secondary | ICD-10-CM | POA: Diagnosis present

## 2019-05-19 DIAGNOSIS — I4819 Other persistent atrial fibrillation: Secondary | ICD-10-CM | POA: Diagnosis present

## 2019-05-19 DIAGNOSIS — J9601 Acute respiratory failure with hypoxia: Secondary | ICD-10-CM

## 2019-05-19 DIAGNOSIS — R0902 Hypoxemia: Secondary | ICD-10-CM

## 2019-05-19 DIAGNOSIS — Z888 Allergy status to other drugs, medicaments and biological substances status: Secondary | ICD-10-CM

## 2019-05-19 DIAGNOSIS — Z83511 Family history of glaucoma: Secondary | ICD-10-CM

## 2019-05-19 DIAGNOSIS — Z90710 Acquired absence of both cervix and uterus: Secondary | ICD-10-CM

## 2019-05-19 DIAGNOSIS — I130 Hypertensive heart and chronic kidney disease with heart failure and stage 1 through stage 4 chronic kidney disease, or unspecified chronic kidney disease: Secondary | ICD-10-CM | POA: Diagnosis present

## 2019-05-19 DIAGNOSIS — K5900 Constipation, unspecified: Secondary | ICD-10-CM | POA: Diagnosis present

## 2019-05-19 DIAGNOSIS — Z881 Allergy status to other antibiotic agents status: Secondary | ICD-10-CM

## 2019-05-19 DIAGNOSIS — I25119 Atherosclerotic heart disease of native coronary artery with unspecified angina pectoris: Secondary | ICD-10-CM | POA: Diagnosis present

## 2019-05-19 DIAGNOSIS — Z20822 Contact with and (suspected) exposure to covid-19: Secondary | ICD-10-CM | POA: Diagnosis present

## 2019-05-19 DIAGNOSIS — I255 Ischemic cardiomyopathy: Secondary | ICD-10-CM | POA: Diagnosis present

## 2019-05-19 DIAGNOSIS — I5042 Chronic combined systolic (congestive) and diastolic (congestive) heart failure: Secondary | ICD-10-CM | POA: Diagnosis present

## 2019-05-19 DIAGNOSIS — E782 Mixed hyperlipidemia: Secondary | ICD-10-CM | POA: Diagnosis present

## 2019-05-19 DIAGNOSIS — K838 Other specified diseases of biliary tract: Secondary | ICD-10-CM | POA: Diagnosis present

## 2019-05-19 DIAGNOSIS — H547 Unspecified visual loss: Secondary | ICD-10-CM | POA: Diagnosis present

## 2019-05-19 DIAGNOSIS — Z880 Allergy status to penicillin: Secondary | ICD-10-CM

## 2019-05-19 DIAGNOSIS — H35039 Hypertensive retinopathy, unspecified eye: Secondary | ICD-10-CM | POA: Diagnosis present

## 2019-05-19 DIAGNOSIS — Z8701 Personal history of pneumonia (recurrent): Secondary | ICD-10-CM

## 2019-05-19 DIAGNOSIS — E669 Obesity, unspecified: Secondary | ICD-10-CM | POA: Diagnosis present

## 2019-05-19 DIAGNOSIS — I252 Old myocardial infarction: Secondary | ICD-10-CM

## 2019-05-19 DIAGNOSIS — Z87891 Personal history of nicotine dependence: Secondary | ICD-10-CM

## 2019-05-19 DIAGNOSIS — F418 Other specified anxiety disorders: Secondary | ICD-10-CM | POA: Diagnosis present

## 2019-05-19 DIAGNOSIS — Z96641 Presence of right artificial hip joint: Secondary | ICD-10-CM | POA: Diagnosis present

## 2019-05-19 DIAGNOSIS — R109 Unspecified abdominal pain: Secondary | ICD-10-CM | POA: Diagnosis not present

## 2019-05-19 DIAGNOSIS — J449 Chronic obstructive pulmonary disease, unspecified: Secondary | ICD-10-CM | POA: Diagnosis present

## 2019-05-19 DIAGNOSIS — R161 Splenomegaly, not elsewhere classified: Secondary | ICD-10-CM | POA: Diagnosis present

## 2019-05-19 DIAGNOSIS — Z885 Allergy status to narcotic agent status: Secondary | ICD-10-CM

## 2019-05-19 DIAGNOSIS — N183 Chronic kidney disease, stage 3 unspecified: Secondary | ICD-10-CM | POA: Diagnosis present

## 2019-05-19 DIAGNOSIS — D509 Iron deficiency anemia, unspecified: Secondary | ICD-10-CM | POA: Diagnosis present

## 2019-05-19 DIAGNOSIS — I4892 Unspecified atrial flutter: Secondary | ICD-10-CM | POA: Diagnosis present

## 2019-05-19 DIAGNOSIS — Z951 Presence of aortocoronary bypass graft: Secondary | ICD-10-CM

## 2019-05-19 DIAGNOSIS — I480 Paroxysmal atrial fibrillation: Secondary | ICD-10-CM | POA: Diagnosis present

## 2019-05-19 DIAGNOSIS — K7581 Nonalcoholic steatohepatitis (NASH): Secondary | ICD-10-CM | POA: Diagnosis present

## 2019-05-19 DIAGNOSIS — N200 Calculus of kidney: Secondary | ICD-10-CM | POA: Diagnosis present

## 2019-05-19 LAB — COMPREHENSIVE METABOLIC PANEL
ALT: 17 U/L (ref 0–44)
AST: 51 U/L — ABNORMAL HIGH (ref 15–41)
Albumin: 3.2 g/dL — ABNORMAL LOW (ref 3.5–5.0)
Alkaline Phosphatase: 81 U/L (ref 38–126)
Anion gap: 9 (ref 5–15)
BUN: 16 mg/dL (ref 8–23)
CO2: 28 mmol/L (ref 22–32)
Calcium: 8.6 mg/dL — ABNORMAL LOW (ref 8.9–10.3)
Chloride: 102 mmol/L (ref 98–111)
Creatinine, Ser: 1.43 mg/dL — ABNORMAL HIGH (ref 0.44–1.00)
GFR calc Af Amer: 43 mL/min — ABNORMAL LOW (ref >60)
GFR calc non Af Amer: 37 mL/min — ABNORMAL LOW (ref >60)
Glucose, Bld: 129 mg/dL — ABNORMAL HIGH (ref 70–99)
Potassium: 3.6 mmol/L (ref 3.5–5.1)
Sodium: 139 mmol/L (ref 135–145)
Total Bilirubin: 2.1 mg/dL — ABNORMAL HIGH (ref 0.3–1.2)
Total Protein: 7.1 g/dL (ref 6.5–8.1)

## 2019-05-19 LAB — CBC WITH DIFFERENTIAL/PLATELET
Abs Immature Granulocytes: 0.03 10*3/uL (ref 0.00–0.07)
Basophils Absolute: 0 10*3/uL (ref 0.0–0.1)
Basophils Relative: 0 %
Eosinophils Absolute: 0.3 10*3/uL (ref 0.0–0.5)
Eosinophils Relative: 5 %
HCT: 40.3 % (ref 36.0–46.0)
Hemoglobin: 12.6 g/dL (ref 12.0–15.0)
Immature Granulocytes: 1 %
Lymphocytes Relative: 18 %
Lymphs Abs: 1 10*3/uL (ref 0.7–4.0)
MCH: 29.6 pg (ref 26.0–34.0)
MCHC: 31.3 g/dL (ref 30.0–36.0)
MCV: 94.8 fL (ref 80.0–100.0)
Monocytes Absolute: 0.4 10*3/uL (ref 0.1–1.0)
Monocytes Relative: 7 %
Neutro Abs: 3.9 10*3/uL (ref 1.7–7.7)
Neutrophils Relative %: 69 %
Platelets: 64 10*3/uL — ABNORMAL LOW (ref 150–400)
RBC: 4.25 MIL/uL (ref 3.87–5.11)
RDW: 16.7 % — ABNORMAL HIGH (ref 11.5–15.5)
WBC: 5.6 10*3/uL (ref 4.0–10.5)
nRBC: 0 % (ref 0.0–0.2)

## 2019-05-19 LAB — URINALYSIS, ROUTINE W REFLEX MICROSCOPIC
Bilirubin Urine: NEGATIVE
Glucose, UA: NEGATIVE mg/dL
Hgb urine dipstick: NEGATIVE
Ketones, ur: NEGATIVE mg/dL
Leukocytes,Ua: NEGATIVE
Nitrite: NEGATIVE
Protein, ur: NEGATIVE mg/dL
Specific Gravity, Urine: 1.013 (ref 1.005–1.030)
pH: 5 (ref 5.0–8.0)

## 2019-05-19 LAB — PHOSPHORUS: Phosphorus: 2.7 mg/dL (ref 2.5–4.6)

## 2019-05-19 LAB — MAGNESIUM: Magnesium: 1.8 mg/dL (ref 1.7–2.4)

## 2019-05-19 LAB — LACTIC ACID, PLASMA
Lactic Acid, Venous: 2.6 mmol/L (ref 0.5–1.9)
Lactic Acid, Venous: 1.4 mmol/L (ref 0.5–1.9)

## 2019-05-19 LAB — LIPASE, BLOOD: Lipase: 46 U/L (ref 11–51)

## 2019-05-19 MED ORDER — PROCHLORPERAZINE EDISYLATE 10 MG/2ML IJ SOLN: 5.0000 mg | INTRAMUSCULAR | Status: DC | PRN

## 2019-05-19 MED ORDER — SODIUM CHLORIDE 0.9 % IV BOLUS
1000.0000 mL | Freq: Once | INTRAVENOUS | Status: AC
  Administered 2019-05-19: 1000 mL via INTRAVENOUS

## 2019-05-19 MED ORDER — IOHEXOL 300 MG/ML  SOLN
80.0000 mL | Freq: Once | INTRAMUSCULAR | Status: AC | PRN
  Administered 2019-05-19: 80 mL via INTRAVENOUS

## 2019-05-19 MED ORDER — CIPROFLOXACIN IN D5W 400 MG/200ML IV SOLN
400.0000 mg | Freq: Once | INTRAVENOUS | Status: AC
  Administered 2019-05-19: 400 mg via INTRAVENOUS
  Filled 2019-05-19: qty 200

## 2019-05-19 MED ORDER — NITROGLYCERIN 0.4 MG SL SUBL: 0.4000 mg | SUBLINGUAL_TABLET | SUBLINGUAL | Status: DC | PRN

## 2019-05-19 MED ORDER — HYDROMORPHONE HCL 1 MG/ML IJ SOLN
1.0000 mg | INTRAMUSCULAR | Status: DC | PRN
  Administered 2019-05-20 – 2019-05-23 (×8): 1 mg via INTRAVENOUS
  Filled 2019-05-19 (×8): qty 1

## 2019-05-19 MED ORDER — ONDANSETRON HCL 4 MG/2ML IJ SOLN
4.0000 mg | Freq: Once | INTRAMUSCULAR | Status: AC
  Administered 2019-05-19: 4 mg via INTRAVENOUS
  Filled 2019-05-19: qty 2

## 2019-05-19 MED ORDER — MAGNESIUM SULFATE 2 GM/50ML IV SOLN
2.0000 g | Freq: Once | INTRAVENOUS | Status: AC
  Administered 2019-05-19: 2 g via INTRAVENOUS
  Filled 2019-05-19: qty 50

## 2019-05-19 MED ORDER — POLYVINYL ALCOHOL 1.4 % OP SOLN: 1.0000 [drp] | Freq: Three times a day (TID) | OPHTHALMIC | Status: DC | PRN

## 2019-05-19 MED ORDER — HYDROMORPHONE HCL 1 MG/ML IJ SOLN
1.0000 mg | Freq: Once | INTRAMUSCULAR | Status: AC
  Administered 2019-05-19: 1 mg via INTRAVENOUS
  Filled 2019-05-19: qty 1

## 2019-05-19 MED ORDER — ALBUTEROL SULFATE HFA 108 (90 BASE) MCG/ACT IN AERS: 2.0000 | INHALATION_SPRAY | Freq: Four times a day (QID) | RESPIRATORY_TRACT | Status: DC | PRN

## 2019-05-19 NOTE — ED Notes (Signed)
In room to introduce self to pt; pt's O2 sats on room air were reading 84%. Placed pt on O2 at 2L nasal cannula and sats increased to 91%. Dr Roderic Palau notified.

## 2019-05-19 NOTE — ED Notes (Signed)
CRITICAL VALUE ALERT  Critical Value: Lactic Acid 2.6 Date & Time Notied:  05/19/19 @ 1925 Provider Notified: Dr Roderic Palau Orders Received/Actions taken: None yet.

## 2019-05-19 NOTE — ED Triage Notes (Signed)
Patient c/o epigastric pain that radiates into back. Per patient started today after eating two bites of baked potatoes and a bite of salad. Patient took two SL nitro with no relief. Patient states was seen here in ED Friday night for same pain and referred to Dr Laural Golden for possible "gallbladder issues." Patient states pain is the same as before. Per patient nausea and vomiting. Patient took hydrocodone at 1pm and at 5pm today with no relief.

## 2019-05-19 NOTE — ED Notes (Signed)
Pt still rates pain as 6/10 after receiving Dilaudid. Dr Roderic Palau notified.

## 2019-05-19 NOTE — ED Provider Notes (Signed)
[facility] Provider Note   CSN: [id number] Arrival date & time: 05/19/19  1744     History Chief Complaint  Patient presents with  . Abdominal Pain    Samantha [name] is a 69 y.o. female.  Patient complains of right upper quadrant abdominal pain that has been bothering for couple weeks.  She had an ultrasound that showed some sludge in her gallbladder.  Patient has a history of cirrhosis.  She was seen 2 days ago with abdominal pain and sent home with pain medicine.  The history is provided by the patient. No language interpreter was used.  Abdominal Pain Pain location:  RUQ Pain quality: aching   Pain radiates to:  Does not radiate Pain severity:  Moderate Onset quality:  Sudden Timing:  Constant Progression:  Worsening Chronicity:  New Context: not alcohol use   Relieved by:  Nothing Associated symptoms: no chest pain, no cough, no diarrhea, no fatigue and no hematuria        Past Medical History:  Diagnosis Date  . Cataract    OU  . Chronic combined systolic and diastolic CHF (congestive heart failure) ([place])   . Cirrhosis of liver ([place])   . CKD (chronic kidney disease), stage III   . Coronary artery disease    a. s/p CABG 2001 w/ (LIMA-OM, SVG-D1, SVG-RCA). b. h/o multiple PCIs per Dr. Antionette Char note.  . Depression   . Esophageal varices ([place])    New 2013  . Gastroesophageal reflux disease   . History of pneumonia   . Hyperlipidemia   . Hypertension   . Hypertensive retinopathy    OU  . Obesity   . Osteoarthritis   . Pancytopenia ([place])   . Paroxysmal atrial flutter ([place])    a. dx 05/2016.  [name] Persistent atrial fibrillation ([place])    a. reported in hosp 07/2016, not on anticoag due to cirrhosis and liver disease, low platelets, varices.  . Thrombocytopenia [facility])     Patient Active Problem List   Diagnosis Date Noted  . Intractable abdominal pain 05/19/2019  . Iron deficiency anemia 02/05/2019  . Chronic combined systolic and  diastolic CHF (congestive heart failure) ([place]) 04/17/2018  . Heart failure ([place]) 04/17/2018  . Osteoarthritis of spine with radiculopathy, cervical region 01/12/2018  . Paresthesia of both hands 12/14/2017  . Recurrent falls 12/14/2017  . Closed compression fracture of first lumbar vertebra ([place]) 08/23/2017  . Lumbar spondylosis with myelopathy 08/23/2017  . Carpal tunnel syndrome of right wrist 04/05/2017  . Rectal pain 03/06/2017  . Influenza A (H1N1) 02/22/2017  . Rectal bleeding 02/22/2017  . Closed fracture of lower end of right radius with routine healing 02/20/2017  . Injury of triangular fibrocartilage complex of right wrist 02/20/2017  . Pain 02/10/2017  . Other cirrhosis of liver ([place]) 10/26/2016  . Persistent atrial fibrillation 08/16/2016  . Cardiomyopathy, ischemic-35-40% by cath  01/11/2015  . Obesity 09/24/2013  . Unspecified hereditary and idiopathic peripheral neuropathy 01/10/2013  . Hereditary and idiopathic peripheral neuropathy 01/10/2013  . Cirrhosis, non-alcoholic ([place]) 74/25/9563  . Esophageal varices- Plavix stopped 12/23/2011  . GI bleed 12/23/2011  . Idiopathic esophageal varices without bleeding ([place]) 12/23/2011  . Anemia 04/22/2011  . CAD (coronary artery disease) 08/21/2009  . Mixed hyperlipidemia 06/04/2008  . Essential hypertension 06/04/2008  . S/P CABG x 3- 2001 06/04/2008    Past Surgical History:  Procedure Laterality Date  . ABDOMINAL HYSTERECTOMY    . BACK SURGERY    . CARDIAC  CATHETERIZATION  2004   left internal mammary artery to the obtuse marginal  was found to be small and thread like.  The two grafts were patent.  The left circumflex had 90% in-stent restenosis and cutting balloon angioplasty was performed followed by placement of a 3.0 x 23m Taxus drug -eluting stent.    .Marland KitchenCARDIAC CATHETERIZATION  2006   There was in-stent restenosis in the left circumflex and this was treated with cutting balloon angioplasty   . CARDIAC  CATHETERIZATION  2008   vein graft to the to the obtuse marginal was patent, although small, left circumflex had 40% in-stent restenosis, ejection fraction 40-45%.  The patient was medically mananged.  .Marland KitchenCARDIAC CATHETERIZATION N/A 01/09/2015   Procedure: Left Heart Cath and Cors/Grafts Angiography;  Surgeon: HBelva Crome MD;  [facility];  Service: Cardiovascular;  Laterality: N/A;  . cataract sx    . COLONOSCOPY  03/29/2011   Procedure: COLONOSCOPY;  Surgeon: MJamesetta So MD;  [facility];  Service: Gastroenterology;  Laterality: N/A;  . CORONARY ARTERY BYPASS GRAFT  May 31,2001   x 3 with a vein graft to the first diagonal, vein graft to the right coronary  artery, and a free left internal mammary  artery to the obtuse marginal   . ESOPHAGEAL BANDING N/A 07/04/2012   Procedure: ESOPHAGEAL BANDING;  Surgeon: NRogene Houston MD;  [facility];  Service: Endoscopy;  Laterality: N/A;  . ESOPHAGEAL BANDING N/A 09/17/2012   Procedure: ESOPHAGEAL BANDING;  Surgeon: NRogene Houston MD;  [facility];  Service: Endoscopy;  Laterality: N/A;  . ESOPHAGEAL BANDING N/A 10/22/2013   Procedure: ESOPHAGEAL BANDING;  Surgeon: NRogene Houston MD;  [facility];  Service: Endoscopy;  Laterality: N/A;  . ESOPHAGEAL BANDING N/A 11/28/2014   Procedure: ESOPHAGEAL BANDING;  Surgeon: NRogene Houston MD;  [facility];  Service: Endoscopy;  Laterality: N/A;  . ESOPHAGEAL BANDING N/A 10/29/2015   Procedure: ESOPHAGEAL BANDING;  Surgeon: NRogene Houston MD;  [facility];  Service: Endoscopy;  Laterality: N/A;  . ESOPHAGOGASTRODUODENOSCOPY  12/20/2011   Procedure: ESOPHAGOGASTRODUODENOSCOPY (EGD);  Surgeon: MJamesetta So MD;  [facility];  Service: Gastroenterology;  Laterality: N/A;  . ESOPHAGOGASTRODUODENOSCOPY N/A 07/04/2012   Procedure: ESOPHAGOGASTRODUODENOSCOPY (EGD);  Surgeon: NRogene Houston MD;  [facility];  Service: Endoscopy;  Laterality: N/A;  235-moved to 255 Ann to notify pt  . ESOPHAGOGASTRODUODENOSCOPY N/A 09/17/2012   Procedure: ESOPHAGOGASTRODUODENOSCOPY (EGD);  Surgeon: NRogene Houston MD;  [facility];  Service: Endoscopy;  Laterality: N/A;  730  . ESOPHAGOGASTRODUODENOSCOPY N/A 10/22/2013   Procedure: ESOPHAGOGASTRODUODENOSCOPY (EGD);  Surgeon: NRogene Houston MD;  [facility];  Service: Endoscopy;  Laterality: N/A;  730  . ESOPHAGOGASTRODUODENOSCOPY N/A 11/28/2014   Procedure: ESOPHAGOGASTRODUODENOSCOPY (EGD);  Surgeon: NRogene Houston MD;  [facility];  Service: Endoscopy;  Laterality: N/A;  1:25  . ESOPHAGOGASTRODUODENOSCOPY N/A 10/29/2015   Procedure: ESOPHAGOGASTRODUODENOSCOPY (EGD);  Surgeon: NRogene Houston MD;  [facility];  Service: Endoscopy;  Laterality: N/A;  12:00  . ESOPHAGOGASTRODUODENOSCOPY N/A 12/12/2016   Procedure: ESOPHAGOGASTRODUODENOSCOPY (EGD);  Surgeon: RRogene Houston MD;  [facility];  Service: Endoscopy;  Laterality: N/A;  225  . FLEXIBLE SIGMOIDOSCOPY N/A 03/20/2017   Procedure: FLEXIBLE SIGMOIDOSCOPY;  Surgeon: RRogene Houston MD;  [facility];  Service: Endoscopy;  Laterality: N/A;  7:30  . JOINT REPLACEMENT    . LEFT HEART CATH AND CORS/GRAFTS ANGIOGRAPHY N/A 08/15/2016   Procedure: Left Heart Cath and Cors/Grafts Angiography;  Surgeon: Jettie Booze, MD;  Location: [facility];  Service: Cardiovascular;  Laterality: N/A;  . LEFT HEART CATHETERIZATION WITH CORONARY/GRAFT ANGIOGRAM N/A 12/25/2013   Procedure: LEFT HEART CATHETERIZATION WITH Beatrix Fetters;  Surgeon: Blane Ohara, MD;  Location: [facility] C[facility] LAB;  Service: Cardiovascular;  Laterality: N/A;  . RIGHT HEART CATH N/A 05/29/2017   Procedure: RIGHT HEART CATH;  Surgeon: Jolaine Artist, MD;  Location: [facility];  Service: Cardiovascular;  Laterality: N/A;  . rotator cuff left   2007  . TONSILLECTOMY       OB History   No obstetric history on file.     Family History  Problem Relation Age of Onset  . Heart attack Mother   . Diabetes Mother   . Heart attack Father 42       cause of death  . Diabetes Father   . Coronary artery disease Sister        CABG   . Diabetes Sister   . Glaucoma Sister   . Diabetes Brother   . Colon cancer Neg Hx     Social History   Tobacco Use  . Smoking status: Former Smoker    Packs/day: 0.50    Years: 20.00    Pack years: 10.00    Types: Cigarettes    Quit date: 07/21/1995    Years since quitting: 23.8  . Smokeless tobacco: Never Used  Substance Use Topics  . Alcohol use: No  . Drug use: No    Home Medications Prior to Admission medications   Medication Sig Start Date End Date Taking? Authorizing Provider  albuterol (PROVENTIL HFA;VENTOLIN HFA) 108 (90 BASE) MCG/ACT inhaler Inhale 2 puffs into the lungs every 6 (six) hours as needed for wheezing or shortness of breath.    Yes [provider]  ezetimibe (ZETIA) 10 MG tablet Take 10 mg by mouth at bedtime.    Yes [provider]  FLUoxetine (PROZAC) 20 MG capsule Take 20 mg by mouth daily.    Yes [provider]  HYDROcodone-acetaminophen (NORCO) 10-325 MG tablet Take 1 tablet by mouth every 6 (six) hours as needed for moderate pain.  11/06/17  Yes [provider]  hydroxypropyl methylcellulose / hypromellose (ISOPTO TEARS / GONIOVISC) 2.5 % ophthalmic solution Place 1 drop into both eyes 3 (three) times daily as needed for dry eyes.   Yes [provider]  isosorbide mononitrate (IMDUR) 60 MG 24 hr tablet Take 1.5 tablets (90 mg total) by mouth daily. 04/18/19 04/12/20 Yes Rolland Porter, MD  metoprolol succinate (TOPROL XL) 25 MG 24 hr tablet Take 1 tablet (25 mg total) by mouth daily. 03/14/18  Yes Weaver, Scott T, PA-C  NITROSTAT 0.4 MG SL tablet Place 0.4 mg under the tongue every 5 (five) minutes as needed for chest pain (x 3  tabs daily).  11/15/13  Yes [provider]  pantoprazole (PROTONIX) 40 MG tablet Take 40 mg by mouth daily.   Yes [provider]  polyethylene glycol (MIRALAX / GLYCOLAX) packet Take 17 g by mouth daily.   Yes [provider]  spironolactone (ALDACTONE) 25 MG tablet Take 1 tablet (25 mg total) by mouth daily. 08/01/18 07/27/19 Yes Sherren Mocha, MD  torsemide (DEMADEX) 20 MG tablet Take 2 tablets (40 mg total) by mouth daily. 11/15/17 05/19/19 Yes  Sherren Mocha, MD  valsartan (DIOVAN) 160 MG tablet Take 1 tablet (160 mg total) by mouth at bedtime. 06/15/17  Yes Shirley Friar, PA-C    Allergies    Entresto [sacubitril-valsartan], Acetaminophen, Oxycodone, Ace inhibitors, Cefaclor, Cephalexin, Penicillins, Pregabalin, and Tape  Review of Systems   Review of Systems  Constitutional: Negative for appetite change and fatigue.  HENT: Negative for congestion, ear discharge and sinus pressure.   Eyes: Negative for discharge.  Respiratory: Negative for cough.   Cardiovascular: Negative for chest pain.  Gastrointestinal: Positive for abdominal pain. Negative for diarrhea.  Genitourinary: Negative for frequency and hematuria.  Musculoskeletal: Negative for back pain.  Skin: Negative for rash.  Neurological: Negative for seizures and headaches.  Psychiatric/Behavioral: Negative for hallucinations.    Physical Exam Updated Vital Signs BP (!) 115/52   Pulse 68   Resp 11   Ht 5' 3"  (1.6 m)   Wt 81.6 kg   LMP 03/29/2011   SpO2 92%   BMI 31.89 kg/m   Physical Exam Vitals and nursing note reviewed.  Constitutional:      Appearance: Normal appearance. She is well-developed.  HENT:     Head: Normocephalic.     Nose: Nose normal.  Eyes:     General: No scleral icterus.    Conjunctiva/sclera: Conjunctivae normal.  Neck:     Thyroid: No thyromegaly.  Cardiovascular:     Rate and Rhythm: Normal rate and regular rhythm.     Heart sounds: No murmur. No  friction rub. No gallop.   Pulmonary:     Breath sounds: No stridor. No wheezing or rales.  Chest:     Chest wall: No tenderness.  Abdominal:     General: There is no distension.     Tenderness: There is abdominal tenderness. There is no rebound.     Comments: Tender right upper quadrant  Musculoskeletal:        General: Normal range of motion.     Cervical back: Neck supple.  Lymphadenopathy:     Cervical: No cervical adenopathy.  Skin:    Findings: No erythema or rash.  Neurological:     Mental Status: She is alert and oriented to person, place, and time.     Motor: No abnormal muscle tone.     Coordination: Coordination normal.  Psychiatric:        Behavior: Behavior normal.     ED Results / Procedures / Treatments   Labs (all labs ordered are listed, but only abnormal results are displayed) Labs Reviewed  CBC WITH DIFFERENTIAL/PLATELET - Abnormal; Notable for the following components:      Result Value   RDW 16.7 (*)    Platelets 64 (*)    All other components within normal limits  COMPREHENSIVE METABOLIC PANEL - Abnormal; Notable for the following components:   Glucose, Bld 129 (*)    Creatinine, Ser 1.43 (*)    Calcium 8.6 (*)    Albumin 3.2 (*)    AST 51 (*)    Total Bilirubin 2.1 (*)    GFR calc non Af Amer 37 (*)    GFR calc Af Amer 43 (*)    All other components within normal limits  LACTIC ACID, PLASMA - Abnormal; Notable for the following components:   Lactic Acid, Venous 2.6 (*)    All other components within normal limits  URINALYSIS, ROUTINE W REFLEX MICROSCOPIC - Abnormal; Notable for the following components:   APPearance HAZY (*)    All other  components within normal limits  LIPASE, BLOOD  MAGNESIUM  PHOSPHORUS    EKG None  Radiology CT ABDOMEN PELVIS W CONTRAST  Result Date: 05/19/2019 CLINICAL DATA:  Abdominal pain EXAM: CT ABDOMEN AND PELVIS WITH CONTRAST TECHNIQUE: Multidetector CT imaging of the abdomen and pelvis was performed using  the standard protocol following bolus administration of intravenous contrast. CONTRAST:  72m OMNIPAQUE IOHEXOL 300 MG/ML  SOLN COMPARISON:  None. FINDINGS: Lower chest: Mild bibasilar scarring. Hepatobiliary: Cirrhosis. Gallbladder is unremarkable. Mild central intrahepatic duct dilatation. Common bile duct is mildly dilated but tapers distally. Pancreas: Unremarkable. Spleen: Splenomegaly Adrenals/Urinary Tract: Bilateral nonobstructing renal calculi measuring up to 3 mm. Adrenals are unremarkable. The bladder is obscured by hip arthroplasty. Stomach/Bowel: Stomach is within limits apart from varices. Bowel is normal in caliber. Bowel loops in the pelvis are obscured by artifact. Normal appendix. Vascular/Lymphatic: Prominent gastric varices. Paraumbilical collateral veins. Small lower paraesophageal varices. Portal vein is patent. Diffuse aortic atherosclerosis. Reproductive: Pelvis is obscured by artifact. Other: No ascites. Musculoskeletal: Postoperative changes L4-L5 and L5-S1. Chronic compression deformity of L1 with cement augmentation right hip arthroplasty with streak artifact IMPRESSION: Cirrhosis with evidence of portal hypertension.  No ascites. Mild central intrahepatic duct dilatation. Common bile duct is mildly dilated but tapers distally. Small nonobstructing renal calculi. Electronically Signed   By: PMacy MisM.D.   On: 05/19/2019 21:01   DG Chest Port 1 View  Result Date: 05/19/2019 CLINICAL DATA:  Shortness of breath EXAM: PORTABLE CHEST 1 VIEW COMPARISON:  05/17/2019 FINDINGS: Cardiomegaly, vascular congestion. Prior CABG. Lingular atelectasis. No confluent opacities or effusions. No acute bony abnormality. IMPRESSION: Cardiomegaly with vascular congestion. Lingular atelectasis. Electronically Signed   By: KRolm BaptiseM.D.   On: 05/19/2019 21:05    Procedures Procedures (including critical care time)  Medications Ordered in ED Medications  HYDROmorphone (DILAUDID) injection 1  mg (has no administration in time range)  HYDROmorphone (DILAUDID) injection 1 mg (1 mg Intravenous Given 05/19/19 1851)  ondansetron (ZOFRAN) injection 4 mg (4 mg Intravenous Given 05/19/19 1848)  sodium chloride 0.9 % bolus 1,000 mL (0 mLs Intravenous Stopped 05/19/19 1958)  sodium chloride 0.9 % bolus 1,000 mL (0 mLs Intravenous Stopped 05/19/19 2144)  ciprofloxacin (CIPRO) IVPB 400 mg (0 mg Intravenous Stopped 05/19/19 2145)  iohexol (OMNIPAQUE) 300 MG/ML solution 80 mL (80 mLs Intravenous Contrast Given 05/19/19 2035)    ED Course  I have reviewed the triage vital signs and the nursing notes.  Pertinent labs & imaging results that were available during my care of the patient were reviewed by me and considered in my medical decision making (see chart for details).    MDM Rules/Calculators/A&P                      Labs unremarkable.  CT scan of the abdomen shows cirrhosis.  Patient with persistent right upper quadrant pain.  She will be admitted to medicine with GI consult tomorrow Final Clinical Impression(s) / ED Diagnoses Final diagnoses:  Pain of upper abdomen    Rx / DC Orders ED Discharge Orders    None       ZMilton Ferguson MD 05/19/19 2159

## 2019-05-19 NOTE — H&P (Signed)
History and Physical    [name] UVO:[id number] DOB: July 29, 1950 DOA: 05/19/2019  PCP: [name], MD  Patient coming from: Home.  I have personally briefly reviewed patient's old medical records in [place]  Chief Complaint: Abdominal pain  HPI: Samantha [name] is a 69 y.o. 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  She was seen on Friday evening due to similar epigastric pain.  She mentions that she returned from cataract surgery in the morning and was sitting in her chair when she developed severe substernal sharp pain associated with nausea and 2 episodes of emesis.  She took 2 nitroglycerin tablets SL and four 81 mg aspirin without relief.  She mentions that the pain was severe enough for about 2 hours and call the EMS.  She mentions that her symptoms started improving while she was coming to the ED.  She denies fever, chills, diarrhea, but has been constipated for the past 2 days, although she has not eaten much.  She denies melena or hematochezia.  No dysuria, frequency or hematuria.  No dizziness, palpitations, diaphoresis, PND or orthopnea, but she develops lower extremity edema on occasion.  She denies polyuria, polydipsia, polyphagia or blurred vision.  ED Course: Initial vital signs temperature 97.5 F, pulse 73, respirations 13, blood pressure 97/47 mmHg and O2 sat 94% on room air.  Patient was given 400 mg of ciprofloxacin IVPB, hydromorphone 1 mg IVP, Zofran 4 mg IVP and 2000 mL of NS bolus.  Her urinalysis had a hazy appearance, but was otherwise  unremarkable.  Initial lactic acid was 2.6 and follow-up was 1.4 mmol/L.  White count is 5.6 with a normal differential, hemoglobin 12.6 g/dL and platelets 64.  Lipase was normal.  CMP has normal electrolytes when calcium is corrected to albumin.  Glucose 129, BUN 16 and creatinine 1.43 mg/dL (around patient's recent baseline creatinine level).  Total protein 7.1 and albumin 3.2 g/dL.  ALT was 51, ALT 17, alkaline phosphatase 81 and total bilirubin 2.1.  Imaging: Chest radiograph shows cardiomegaly with vascular congestion.  There was lingular atelectasis.  CT abdomen/pelvis shows cirrhosis with evidence of portal hypertension, but no ascites.  There was mild central intrahepatic duct dilation CBD is mildly dilated but tapers distally.  There is a small nonobstructive renal calculi. Two days ago, the patient had a CTA chest aorta which shows diffuse aortic atherosclerosis, but no aneurysm or dissection.  There was cardiomegaly with prior CABG.  There were areas of scarring in the lungs, but no acute cardiopulmonary disease.  Changes of cirrhosis with upper abdominal varices and splenomegaly was seen as well.  An ultrasound ordered by Dr. Laural Golden showed unchanged cirrhosis with slightly increased moderate splenomegaly and gallbladder sludge with mild wall thickening presumably related to underlying liver disease in the absence of acute right upper quadrant pain.  Please see images and full radiology reports for further detail.  Review of Systems: As per HPI otherwise 10 point review of systems negative.   Past Medical History:  Diagnosis Date  . Cataract    OU  . Chronic combined systolic and diastolic CHF (congestive heart failure) ([place])   . Cirrhosis of liver ([place])   .  CKD (chronic kidney disease), stage III   . Coronary artery disease    a. s/p CABG 2001 w/ (LIMA-OM, SVG-D1, SVG-RCA). b. h/o multiple PCIs per Dr. Antionette Char note.  . Depression   . Esophageal varices ([place])    New 2013  .  Gastroesophageal reflux disease   . History of pneumonia   . Hyperlipidemia   . Hypertension   . Hypertensive retinopathy    OU  . Obesity   . Osteoarthritis   . Pancytopenia ([place])   . Paroxysmal atrial flutter ([place])    a. dx 05/2016.  [name] Persistent atrial fibrillation ([place])    a. reported in hosp 07/2016, not on anticoag due to cirrhosis and liver disease, low platelets, varices.  . Thrombocytopenia ([place])     Past Surgical History:  Procedure Laterality Date  . ABDOMINAL HYSTERECTOMY    . BACK SURGERY    . CARDIAC CATHETERIZATION  2004   left internal mammary artery to the obtuse marginal  was found to be small and thread like.  The two grafts were patent.  The left circumflex had 90% in-stent restenosis and cutting balloon angioplasty was performed followed by placement of a 3.0 x 44m Taxus drug -eluting stent.    .Marland KitchenCARDIAC CATHETERIZATION  2006   There was in-stent restenosis in the left circumflex and this was treated with cutting balloon angioplasty   . CARDIAC CATHETERIZATION  2008   vein graft to the to the obtuse marginal was patent, although small, left circumflex had 40% in-stent restenosis, ejection fraction 40-45%.  The patient was medically mananged.  .Marland KitchenCARDIAC CATHETERIZATION N/A 01/09/2015   Procedure: Left Heart Cath and Cors/Grafts Angiography;  Surgeon: HBelva Crome MD;  [facility];  Service: Cardiovascular;  Laterality: N/A;  . cataract sx    . COLONOSCOPY  03/29/2011   Procedure: COLONOSCOPY;  Surgeon: MJamesetta So MD;  [facility];  Service: Gastroenterology;  Laterality: N/A;  . CORONARY ARTERY BYPASS GRAFT  May 31,2001   x 3 with a vein graft to the first diagonal, vein graft to the right coronary  artery, and a free left internal mammary  artery to the obtuse marginal   . ESOPHAGEAL BANDING N/A 07/04/2012   Procedure: ESOPHAGEAL BANDING;  Surgeon: NRogene Houston MD;  [facility];  Service: Endoscopy;  Laterality:  N/A;  . ESOPHAGEAL BANDING N/A 09/17/2012   Procedure: ESOPHAGEAL BANDING;  Surgeon: NRogene Houston MD;  [facility];  Service: Endoscopy;  Laterality: N/A;  . ESOPHAGEAL BANDING N/A 10/22/2013   Procedure: ESOPHAGEAL BANDING;  Surgeon: NRogene Houston MD;  [facility];  Service: Endoscopy;  Laterality: N/A;  . ESOPHAGEAL BANDING N/A 11/28/2014   Procedure: ESOPHAGEAL BANDING;  Surgeon: NRogene Houston MD;  [facility];  Service: Endoscopy;  Laterality: N/A;  . ESOPHAGEAL BANDING N/A 10/29/2015   Procedure: ESOPHAGEAL BANDING;  Surgeon: NRogene Houston MD;  [facility];  Service: Endoscopy;  Laterality: N/A;  . ESOPHAGOGASTRODUODENOSCOPY  12/20/2011   Procedure: ESOPHAGOGASTRODUODENOSCOPY (EGD);  Surgeon: MJamesetta So MD;  [facility];  Service: Gastroenterology;  Laterality: N/A;  . ESOPHAGOGASTRODUODENOSCOPY N/A 07/04/2012   Procedure: ESOPHAGOGASTRODUODENOSCOPY (EGD);  Surgeon: NRogene Houston MD;  [facility];  Service: Endoscopy;  Laterality: N/A;  235-moved to 255 Ann to notify pt  . ESOPHAGOGASTRODUODENOSCOPY N/A 09/17/2012   Procedure: ESOPHAGOGASTRODUODENOSCOPY (EGD);  Surgeon: NRogene Houston MD;  [facility];  Service: Endoscopy;  Laterality: N/A;  730  . ESOPHAGOGASTRODUODENOSCOPY N/A 10/22/2013   Procedure: ESOPHAGOGASTRODUODENOSCOPY (EGD);  Surgeon: Rogene Houston, MD;  [facility];  Service: Endoscopy;  Laterality: N/A;  730  . ESOPHAGOGASTRODUODENOSCOPY N/A 11/28/2014   Procedure: ESOPHAGOGASTRODUODENOSCOPY (EGD);  Surgeon: Rogene Houston, MD;  [facility];  Service: Endoscopy;  Laterality: N/A;  1:25  . ESOPHAGOGASTRODUODENOSCOPY N/A 10/29/2015   Procedure: ESOPHAGOGASTRODUODENOSCOPY (EGD);  Surgeon: Rogene Houston, MD;  [facility];  Service: Endoscopy;  Laterality: N/A;  12:00  . ESOPHAGOGASTRODUODENOSCOPY N/A 12/12/2016   Procedure:  ESOPHAGOGASTRODUODENOSCOPY (EGD);  Surgeon: Rogene Houston, MD;  [facility];  Service: Endoscopy;  Laterality: N/A;  225  . FLEXIBLE SIGMOIDOSCOPY N/A 03/20/2017   Procedure: FLEXIBLE SIGMOIDOSCOPY;  Surgeon: Rogene Houston, MD;  [facility];  Service: Endoscopy;  Laterality: N/A;  7:30  . JOINT REPLACEMENT    . LEFT HEART CATH AND CORS/GRAFTS ANGIOGRAPHY N/A 08/15/2016   Procedure: Left Heart Cath and Cors/Grafts Angiography;  Surgeon: Jettie Booze, MD;  Location: [facility];  Service: Cardiovascular;  Laterality: N/A;  . LEFT HEART CATHETERIZATION WITH CORONARY/GRAFT ANGIOGRAM N/A 12/25/2013   Procedure: LEFT HEART CATHETERIZATION WITH Beatrix Fetters;  Surgeon: Blane Ohara, MD;  [facility];  Service: Cardiovascular;  Laterality: N/A;  . RIGHT HEART CATH N/A 05/29/2017   Procedure: RIGHT HEART CATH;  Surgeon: Jolaine Artist, MD;  Location: [place] [facility];  Service: Cardiovascular;  Laterality: N/A;  . rotator cuff left  2007  . TONSILLECTOMY       reports that she quit smoking about 23 years ago. Her smoking use included cigarettes. She has a 10.00 pack-year smoking history. She has never used smokeless tobacco. She reports that she does not drink alcohol or use drugs.  Allergies  Allergen Reactions  . Entresto [Sacubitril-Valsartan] Swelling    Swelling of eyes  . Acetaminophen Other (See Comments)    Liver problems  . Oxycodone Itching and Nausea Only  . Ace Inhibitors Other (See Comments)    UNSURE OF REACTION TYPE   . Cefaclor Itching and Rash  . Cephalexin Itching and Rash  . Penicillins Rash    Has patient had a PCN reaction causing immediate rash, facial/tongue/throat swelling, SOB or lightheadedness with hypotension: YES Has patient had a PCN reaction causing severe rash involving mucus membranes or skin necrosis: NO Has patient had a PCN reaction that required hospitalization: YES Has patient had a  PCN reaction occurring within the last 10 years: NO If all of the above answers are "NO", then may proceed with Cephalosporin use.   . Pregabalin Other (See Comments)    Retains fluid  . Tape Itching and Rash    Takes skin right off with medical tape--PAPER TAPE ONLY    Family History  Problem Relation Age of Onset  . Heart attack Mother   . Diabetes Mother   . Heart attack Father 73       cause of death  . Diabetes Father   . Coronary artery disease Sister        CABG   . Diabetes Sister   . Glaucoma Sister   . Diabetes Brother   . Colon cancer Neg Hx    Prior to Admission medications   Medication Sig Start Date End Date Taking? Authorizing Provider  albuterol (PROVENTIL HFA;VENTOLIN HFA) 108 (90 BASE) MCG/ACT inhaler Inhale 2 puffs into  the lungs every 6 (six) hours as needed for wheezing or shortness of breath.    Yes [provider]  ezetimibe (ZETIA) 10 MG tablet Take 10 mg by mouth at bedtime.    Yes [provider]  FLUoxetine (PROZAC) 20 MG capsule Take 20 mg by mouth daily.    Yes [provider]  HYDROcodone-acetaminophen (NORCO) 10-325 MG tablet Take 1 tablet by mouth every 6 (six) hours as needed for moderate pain.  11/06/17  Yes [provider]  hydroxypropyl methylcellulose / hypromellose (ISOPTO TEARS / GONIOVISC) 2.5 % ophthalmic solution Place 1 drop into both eyes 3 (three) times daily as needed for dry eyes.   Yes [provider]  isosorbide mononitrate (IMDUR) 60 MG 24 hr tablet Take 1.5 tablets (90 mg total) by mouth daily. 04/18/19 04/12/20 Yes Rolland Porter, MD  metoprolol succinate (TOPROL XL) 25 MG 24 hr tablet Take 1 tablet (25 mg total) by mouth daily. 03/14/18  Yes Weaver, Scott T, PA-C  NITROSTAT 0.4 MG SL tablet Place 0.4 mg under the tongue every 5 (five) minutes as needed for chest pain (x 3 tabs daily).  11/15/13  Yes [provider]  pantoprazole (PROTONIX) 40 MG tablet Take 40 mg by mouth daily.   Yes  [provider]  polyethylene glycol (MIRALAX / GLYCOLAX) packet Take 17 g by mouth daily.   Yes [provider]  spironolactone (ALDACTONE) 25 MG tablet Take 1 tablet (25 mg total) by mouth daily. 08/01/18 07/27/19 Yes Sherren Mocha, MD  torsemide (DEMADEX) 20 MG tablet Take 2 tablets (40 mg total) by mouth daily. 11/15/17 05/19/19 Yes Sherren Mocha, MD  valsartan (DIOVAN) 160 MG tablet Take 1 tablet (160 mg total) by mouth at bedtime. 06/15/17  Yes Shirley Friar, PA-C    Physical Exam: Vitals:   05/19/19 2115 05/19/19 2130 05/19/19 2200 05/19/19 2250  BP:  (!) 115/52 (!) 116/92 (!) 118/53  Pulse:  68 64 72  Resp: 11  11 20   Temp:    (!) 97.5 F (36.4 C)  TempSrc:    Oral  SpO2:  92% 97% 98%  Weight:      Height:        Constitutional: NAD, calm, comfortable Eyes: PERRL, lids and conjunctivae normal.  Mild scleral icterus. ENMT: Mucous membranes are moist. Posterior pharynx clear of any exudate or lesions. Neck: normal, supple, no masses, no thyromegaly Respiratory: clear to auscultation bilaterally, no wheezing, no crackles. Normal respiratory effort. No accessory muscle use.  Cardiovascular: Regular rate and rhythm, no murmurs / rubs / gallops.  Trace lower extremities pitting edema. 2+ pedal pulses. No carotid bruits.  Abdomen: Obese, mildly distended.  BS positive.  Soft, positive RUQ tenderness, no masses palpated. Musculoskeletal: no clubbing / cyanosis. Good ROM, no contractures. Normal muscle tone.  Skin: Some scattered small areas of ecchymosis. Neurologic: CN 2-12 grossly intact. Sensation intact, DTR normal. Strength 5/5 in all 4.  Psychiatric: Normal judgment and insight. Alert and oriented x 3. Normal mood.   Labs on Admission: I have personally reviewed following labs and imaging studies  CBC: Recent Labs  Lab 05/15/19 1428 05/17/19 1740 05/19/19 1849  WBC 4.0 5.1 5.6  NEUTROABS 2.6 3.6 3.9  HGB 12.7 12.1 12.6  HCT 41.9 39.7 40.3    MCV 95.9 97.3 94.8  PLT 62* 55* 64*   Basic Metabolic Panel: Recent Labs  Lab 05/15/19 1428 05/17/19 1740 05/19/19 1849  NA 140 141 139  K 4.0 3.9 3.6  CL 103 104 102  CO2 26 29 28   GLUCOSE 196* 154* 129*  BUN 11 13 16   CREATININE 1.03* 1.42* 1.43*  CALCIUM 8.9 8.9 8.6*  MG  --   --  1.8  PHOS  --   --  2.7   GFR: Estimated Creatinine Clearance: 37 mL/min (A) (by C-G formula based on SCr of 1.43 mg/dL (H)). Liver Function Tests: Recent Labs  Lab 05/15/19 1428 05/17/19 1740 05/19/19 1849  AST 43* 52* 51*  ALT 17 16 17   ALKPHOS 79 76 81  BILITOT 2.3* 2.5* 2.1*  PROT 7.3 6.7 7.1  ALBUMIN 3.1* 3.0* 3.2*   Recent Labs  Lab 05/17/19 1740 05/19/19 1849  LIPASE 37 46   No results for input(s): AMMONIA in the last 168 hours. Coagulation Profile: Recent Labs  Lab 05/15/19 1428  INR 1.4*   Cardiac Enzymes: No results for input(s): CKTOTAL, CKMB, CKMBINDEX, TROPONINI in the last 168 hours. BNP (last 3 results) No results for input(s): PROBNP in the last 8760 hours. HbA1C: No results for input(s): HGBA1C in the last 72 hours. CBG: No results for input(s): GLUCAP in the last 168 hours. Lipid Profile: No results for input(s): CHOL, HDL, LDLCALC, TRIG, CHOLHDL, LDLDIRECT in the last 72 hours. Thyroid Function Tests: No results for input(s): TSH, T4TOTAL, FREET4, T3FREE, THYROIDAB in the last 72 hours. Anemia Panel: No results for input(s): VITAMINB12, FOLATE, FERRITIN, TIBC, IRON, RETICCTPCT in the last 72 hours. Urine analysis:    Component Value Date/Time   COLORURINE YELLOW 05/19/2019 2015   APPEARANCEUR HAZY (A) 05/19/2019 2015   LABSPEC 1.013 05/19/2019 2015   PHURINE 5.0 05/19/2019 2015   GLUCOSEU NEGATIVE 05/19/2019 2015   HGBUR NEGATIVE 05/19/2019 2015   [place] NEGATIVE 05/19/2019 2015   KETONESUR NEGATIVE 05/19/2019 2015   PROTEINUR NEGATIVE 05/19/2019 2015   NITRITE NEGATIVE 05/19/2019 2015   LEUKOCYTESUR NEGATIVE 05/19/2019 2015     Radiological Exams on Admission: CT ABDOMEN PELVIS W CONTRAST  Result Date: 05/19/2019 CLINICAL DATA:  Abdominal pain EXAM: CT ABDOMEN AND PELVIS WITH CONTRAST TECHNIQUE: Multidetector CT imaging of the abdomen and pelvis was performed using the standard protocol following bolus administration of intravenous contrast. CONTRAST:  36m OMNIPAQUE IOHEXOL 300 MG/ML  SOLN COMPARISON:  None. FINDINGS: Lower chest: Mild bibasilar scarring. Hepatobiliary: Cirrhosis. Gallbladder is unremarkable. Mild central intrahepatic duct dilatation. Common bile duct is mildly dilated but tapers distally. Pancreas: Unremarkable. Spleen: Splenomegaly Adrenals/Urinary Tract: Bilateral nonobstructing renal calculi measuring up to 3 mm. Adrenals are unremarkable. The bladder is obscured by hip arthroplasty. Stomach/Bowel: Stomach is within limits apart from varices. Bowel is normal in caliber. Bowel loops in the pelvis are obscured by artifact. Normal appendix. Vascular/Lymphatic: Prominent gastric varices. Paraumbilical collateral veins. Small lower paraesophageal varices. Portal vein is patent. Diffuse aortic atherosclerosis. Reproductive: Pelvis is obscured by artifact. Other: No ascites. Musculoskeletal: Postoperative changes L4-L5 and L5-S1. Chronic compression deformity of L1 with cement augmentation right hip arthroplasty with streak artifact IMPRESSION: Cirrhosis with evidence of portal hypertension.  No ascites. Mild central intrahepatic duct dilatation. Common bile duct is mildly dilated but tapers distally. Small nonobstructing renal calculi. Electronically Signed   By: PMacy MisM.D.   On: 05/19/2019 21:01   DG Chest Port 1 View  Result Date: 05/19/2019 CLINICAL DATA:  Shortness of breath EXAM: PORTABLE CHEST 1 VIEW COMPARISON:  05/17/2019 FINDINGS: Cardiomegaly, vascular congestion. Prior CABG. Lingular atelectasis. No confluent opacities or effusions. No acute bony abnormality. IMPRESSION: Cardiomegaly with  vascular congestion. Lingular atelectasis.  Electronically Signed   By: Rolm Baptise M.D.   On: 05/19/2019 21:05   11/15/2017 echocardiogram  LV EF: 40% -  45%   -------------------------------------------------------------------  Indications:   CHF (I50.22).   -------------------------------------------------------------------  History:  PMH:  Atrial fibrillation. Coronary artery disease.  Congestive heart failure. Congestive heart failure. Risk factors:  Non-Alcoholic Liver Cirrhosis, Obesity. Family history of coronary  artery disease. Former tobacco use. Hypertension. Dyslipidemia.   -------------------------------------------------------------------  Study Conclusions   - Left ventricle: The cavity size was normal. Wall thickness was  normal. Systolic function was mildly to moderately reduced. The  estimated ejection fraction was in the range of 40% to 45%.  Inferior and lateral hypokinesis. Doppler parameters are  consistent with pseudonormal left ventricular relaxation (grade 2  diastolic dysfunction). The E/e&' ratio is >15, suggesting  elevated LV filling pressure.  - Left atrium: The atrium was mildly dilated.  - Tricuspid valve: There was trivial regurgitation.  - Pulmonary arteries: PA peak pressure: 37 mm Hg (S).  - Inferior vena cava: The vessel was dilated. The respirophasic  diameter changes were blunted (< 50%), consistent with elevated  central venous pressure.   EKG: Independently reviewed. Vent. rate 68 BPM PR interval * ms QRS duration 154 ms QT/QTc 516/549 ms P-R-T axes 161 108 -54 Sinus or ectopic atrial rhythm Ventricular premature complex Borderline prolonged PR interval Nonspecific intraventricular conduction delay Borderline repolarization abnormality  Assessment/Plan Principal problem:   Intractable abdominal pain Observation/telemetry. Keep n.p.o. Analgesics as needed. Antiemetics as needed. Consult Dr. Laural Golden in  a.m. Consider NM hepatobiliary scan.  Active Problems:     Mixed hyperlipidemia Resume Zetia once cleared for oral intake.    CAD (coronary artery disease) Resume metoprolol once BP rises. Continue hyperlipidemia treatment    Essential hypertension Holding antihypertensives due to soft pressures. Monitor BP and HR.    Cirrhosis, non-alcoholic (HCC) Monitor LFTs.    Obesity The patient has been losing weight.    Chronic combined systolic and diastolic CHF (congestive heart failure) (HCC) Trace lower extremity edema. Resume diuretics in a.m. if BP allows. Check echocardiogram.    Paroxysmal atrial fibrillation (HCC) CHA?DS?-VASc Score of least 4. Continue metoprolol for rate control. Anticoagulation contraindicated due to cirrhosis.    Anxiety with depression Continue fluoxetine. The patient is tapering off and using it EOD.    Sinus arrhythmia Optimize electrolytes.   DVT prophylaxis: SCDs. Code Status: Full code. Family Communication:  Disposition Plan: Observation overnight for intractable pain. Consults called: Admission status: Observation/telemetry.   Reubin Milan MD [facility]  If 7PM-7AM, please contact night-coverage [email or web address]  05/19/2019, 10:51 PM   This document was prepared using Dragon voice recognition software and may contain some unintended transcription errors

## 2019-05-20 ENCOUNTER — Observation Stay (HOSPITAL_COMMUNITY): Payer: Medicare Other

## 2019-05-20 ENCOUNTER — Other Ambulatory Visit: Payer: Self-pay | Admitting: *Deleted

## 2019-05-20 ENCOUNTER — Encounter (HOSPITAL_COMMUNITY): Payer: Self-pay | Admitting: Internal Medicine

## 2019-05-20 DIAGNOSIS — J9811 Atelectasis: Secondary | ICD-10-CM | POA: Diagnosis not present

## 2019-05-20 DIAGNOSIS — I361 Nonrheumatic tricuspid (valve) insufficiency: Secondary | ICD-10-CM

## 2019-05-20 DIAGNOSIS — N179 Acute kidney failure, unspecified: Secondary | ICD-10-CM | POA: Diagnosis not present

## 2019-05-20 DIAGNOSIS — D61818 Other pancytopenia: Secondary | ICD-10-CM | POA: Diagnosis present

## 2019-05-20 DIAGNOSIS — R0789 Other chest pain: Secondary | ICD-10-CM | POA: Diagnosis present

## 2019-05-20 DIAGNOSIS — D638 Anemia in other chronic diseases classified elsewhere: Secondary | ICD-10-CM | POA: Diagnosis not present

## 2019-05-20 DIAGNOSIS — K7581 Nonalcoholic steatohepatitis (NASH): Secondary | ICD-10-CM | POA: Diagnosis present

## 2019-05-20 DIAGNOSIS — I5042 Chronic combined systolic (congestive) and diastolic (congestive) heart failure: Secondary | ICD-10-CM | POA: Diagnosis not present

## 2019-05-20 DIAGNOSIS — N183 Chronic kidney disease, stage 3 unspecified: Secondary | ICD-10-CM | POA: Diagnosis present

## 2019-05-20 DIAGNOSIS — R932 Abnormal findings on diagnostic imaging of liver and biliary tract: Secondary | ICD-10-CM | POA: Diagnosis not present

## 2019-05-20 DIAGNOSIS — E782 Mixed hyperlipidemia: Secondary | ICD-10-CM | POA: Diagnosis present

## 2019-05-20 DIAGNOSIS — I340 Nonrheumatic mitral (valve) insufficiency: Secondary | ICD-10-CM

## 2019-05-20 DIAGNOSIS — I4819 Other persistent atrial fibrillation: Secondary | ICD-10-CM | POA: Diagnosis present

## 2019-05-20 DIAGNOSIS — K766 Portal hypertension: Secondary | ICD-10-CM | POA: Diagnosis present

## 2019-05-20 DIAGNOSIS — K7469 Other cirrhosis of liver: Secondary | ICD-10-CM | POA: Diagnosis present

## 2019-05-20 DIAGNOSIS — I130 Hypertensive heart and chronic kidney disease with heart failure and stage 1 through stage 4 chronic kidney disease, or unspecified chronic kidney disease: Secondary | ICD-10-CM | POA: Diagnosis present

## 2019-05-20 DIAGNOSIS — R109 Unspecified abdominal pain: Secondary | ICD-10-CM | POA: Diagnosis not present

## 2019-05-20 DIAGNOSIS — J9621 Acute and chronic respiratory failure with hypoxia: Secondary | ICD-10-CM | POA: Diagnosis not present

## 2019-05-20 DIAGNOSIS — H2511 Age-related nuclear cataract, right eye: Secondary | ICD-10-CM | POA: Diagnosis present

## 2019-05-20 DIAGNOSIS — F418 Other specified anxiety disorders: Secondary | ICD-10-CM | POA: Diagnosis not present

## 2019-05-20 DIAGNOSIS — J9601 Acute respiratory failure with hypoxia: Secondary | ICD-10-CM | POA: Diagnosis not present

## 2019-05-20 DIAGNOSIS — R1013 Epigastric pain: Secondary | ICD-10-CM | POA: Diagnosis not present

## 2019-05-20 DIAGNOSIS — Z20822 Contact with and (suspected) exposure to covid-19: Secondary | ICD-10-CM | POA: Diagnosis present

## 2019-05-20 DIAGNOSIS — R1011 Right upper quadrant pain: Secondary | ICD-10-CM | POA: Diagnosis not present

## 2019-05-20 DIAGNOSIS — I10 Essential (primary) hypertension: Secondary | ICD-10-CM | POA: Diagnosis not present

## 2019-05-20 DIAGNOSIS — K219 Gastro-esophageal reflux disease without esophagitis: Secondary | ICD-10-CM | POA: Diagnosis present

## 2019-05-20 DIAGNOSIS — K7460 Unspecified cirrhosis of liver: Secondary | ICD-10-CM | POA: Diagnosis not present

## 2019-05-20 DIAGNOSIS — I5043 Acute on chronic combined systolic (congestive) and diastolic (congestive) heart failure: Secondary | ICD-10-CM | POA: Diagnosis not present

## 2019-05-20 DIAGNOSIS — I5032 Chronic diastolic (congestive) heart failure: Secondary | ICD-10-CM | POA: Diagnosis not present

## 2019-05-20 DIAGNOSIS — I4892 Unspecified atrial flutter: Secondary | ICD-10-CM | POA: Diagnosis present

## 2019-05-20 DIAGNOSIS — K838 Other specified diseases of biliary tract: Secondary | ICD-10-CM | POA: Diagnosis present

## 2019-05-20 DIAGNOSIS — I8510 Secondary esophageal varices without bleeding: Secondary | ICD-10-CM | POA: Diagnosis present

## 2019-05-20 LAB — COMPREHENSIVE METABOLIC PANEL
ALT: 16 U/L (ref 0–44)
AST: 41 U/L (ref 15–41)
Albumin: 2.7 g/dL — ABNORMAL LOW (ref 3.5–5.0)
Alkaline Phosphatase: 74 U/L (ref 38–126)
Anion gap: 7 (ref 5–15)
BUN: 16 mg/dL (ref 8–23)
CO2: 28 mmol/L (ref 22–32)
Calcium: 8.1 mg/dL — ABNORMAL LOW (ref 8.9–10.3)
Chloride: 105 mmol/L (ref 98–111)
Creatinine, Ser: 1.31 mg/dL — ABNORMAL HIGH (ref 0.44–1.00)
GFR calc Af Amer: 48 mL/min — ABNORMAL LOW (ref >60)
GFR calc non Af Amer: 41 mL/min — ABNORMAL LOW (ref >60)
Glucose, Bld: 95 mg/dL (ref 70–99)
Potassium: 3.7 mmol/L (ref 3.5–5.1)
Sodium: 140 mmol/L (ref 135–145)
Total Bilirubin: 1.4 mg/dL — ABNORMAL HIGH (ref 0.3–1.2)
Total Protein: 6.3 g/dL — ABNORMAL LOW (ref 6.5–8.1)

## 2019-05-20 LAB — CBC WITH DIFFERENTIAL/PLATELET
Abs Immature Granulocytes: 0.01 10*3/uL (ref 0.00–0.07)
Basophils Absolute: 0 10*3/uL (ref 0.0–0.1)
Basophils Relative: 1 %
Eosinophils Absolute: 0.1 10*3/uL (ref 0.0–0.5)
Eosinophils Relative: 5 %
HCT: 35.8 % — ABNORMAL LOW (ref 36.0–46.0)
Hemoglobin: 10.9 g/dL — ABNORMAL LOW (ref 12.0–15.0)
Immature Granulocytes: 0 %
Lymphocytes Relative: 34 %
Lymphs Abs: 0.9 10*3/uL (ref 0.7–4.0)
MCH: 29.1 pg (ref 26.0–34.0)
MCHC: 30.4 g/dL (ref 30.0–36.0)
MCV: 95.7 fL (ref 80.0–100.0)
Monocytes Absolute: 0.3 10*3/uL (ref 0.1–1.0)
Monocytes Relative: 11 %
Neutro Abs: 1.3 10*3/uL — ABNORMAL LOW (ref 1.7–7.7)
Neutrophils Relative %: 49 %
Platelets: 46 10*3/uL — ABNORMAL LOW (ref 150–400)
RBC: 3.74 MIL/uL — ABNORMAL LOW (ref 3.87–5.11)
RDW: 16.6 % — ABNORMAL HIGH (ref 11.5–15.5)
WBC: 2.6 10*3/uL — ABNORMAL LOW (ref 4.0–10.5)
nRBC: 0 % (ref 0.0–0.2)

## 2019-05-20 LAB — HIV ANTIBODY (ROUTINE TESTING W REFLEX): HIV Screen 4th Generation wRfx: NONREACTIVE

## 2019-05-20 LAB — ECHOCARDIOGRAM COMPLETE
Height: 63 in
Weight: 3043.2 oz

## 2019-05-20 MED ORDER — METOPROLOL SUCCINATE ER 25 MG PO TB24
12.5000 mg | ORAL_TABLET | Freq: Every day | ORAL | Status: DC
  Administered 2019-05-21 – 2019-05-24 (×4): 12.5 mg via ORAL
  Filled 2019-05-20 (×5): qty 1

## 2019-05-20 MED ORDER — METOPROLOL SUCCINATE ER 25 MG PO TB24: 25.0000 mg | ORAL_TABLET | Freq: Every day | ORAL | Status: DC

## 2019-05-20 MED ORDER — PANTOPRAZOLE SODIUM 40 MG PO TBEC
40.0000 mg | DELAYED_RELEASE_TABLET | Freq: Every day | ORAL | Status: DC
  Administered 2019-05-20 – 2019-05-24 (×5): 40 mg via ORAL
  Filled 2019-05-20 (×5): qty 1

## 2019-05-20 MED ORDER — FLUOXETINE HCL 20 MG PO CAPS
20.0000 mg | ORAL_CAPSULE | Freq: Every day | ORAL | Status: DC
  Administered 2019-05-20 – 2019-05-24 (×5): 20 mg via ORAL
  Filled 2019-05-20 (×5): qty 1

## 2019-05-20 MED ORDER — DIPHENHYDRAMINE HCL 25 MG PO CAPS
25.0000 mg | ORAL_CAPSULE | Freq: Four times a day (QID) | ORAL | Status: AC | PRN
  Administered 2019-05-20 – 2019-05-21 (×3): 25 mg via ORAL
  Filled 2019-05-20 (×3): qty 1

## 2019-05-20 MED ORDER — DIPHENHYDRAMINE HCL 50 MG/ML IJ SOLN
25.0000 mg | Freq: Once | INTRAMUSCULAR | Status: AC
  Administered 2019-05-20: 25 mg via INTRAVENOUS
  Filled 2019-05-20: qty 1

## 2019-05-20 NOTE — Addendum Note (Signed)
Addendum  created 05/20/19 1217 by Ollen Bowl, CRNA   Charge Capture section accepted

## 2019-05-20 NOTE — Progress Notes (Signed)
PROGRESS NOTE    Samantha [name]  YYQ:[id number] DOB: March 13, 1950 DOA: 05/19/2019 PCP: [name], MD     Brief Narrative:  As per H&P written by Dr. [name] on 05/19/19 69 y.o. 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  She was seen on Friday evening due to similar epigastric pain.  She mentions that she returned from cataract surgery in the morning and was sitting in her chair when she developed severe substernal sharp pain associated with nausea and 2 episodes of emesis.  She took 2 nitroglycerin tablets SL and four 81 mg aspirin without relief.  She mentions that the pain was severe enough for about 2 hours and call the EMS.  She mentions that her symptoms started improving while she was coming to the ED.  She denies fever, chills, diarrhea, but has been constipated for the past 2 days, although she has not eaten much.  She denies melena or hematochezia.  No dysuria, frequency or hematuria.  No dizziness, palpitations, diaphoresis, PND or orthopnea, but she develops lower extremity edema on occasion.  She denies polyuria, polydipsia, polyphagia or blurred vision.  ED Course: Initial vital signs temperature 97.5 F, pulse 73, respirations 13, blood pressure 97/47 mmHg and O2 sat 94% on room air.  Patient was given 400 mg of ciprofloxacin IVPB, hydromorphone 1 mg IVP, Zofran 4 mg IVP and 2000 mL of NS bolus.  Her urinalysis had a hazy appearance, but was otherwise unremarkable.  Initial lactic acid was 2.6 and follow-up was 1.4 mmol/L.  White count is 5.6 with a normal differential,  hemoglobin 12.6 g/dL and platelets 64.  Lipase was normal.  CMP has normal electrolytes when calcium is corrected to albumin.  Glucose 129, BUN 16 and creatinine 1.43 mg/dL (around patient's recent baseline creatinine level).  Total protein 7.1 and albumin 3.2 g/dL.  ALT was 51, ALT 17, alkaline phosphatase 81 and total bilirubin 2.1.  Imaging: Chest radiograph shows cardiomegaly with vascular congestion.  There was lingular atelectasis.  CT abdomen/pelvis shows cirrhosis with evidence of portal hypertension, but no ascites.  There was mild central intrahepatic duct dilation CBD is mildly dilated but tapers distally.  There is a small nonobstructive renal calculi. Two days ago, the patient had a CTA chest aorta which shows diffuse aortic atherosclerosis, but no aneurysm or dissection.  There was cardiomegaly with prior CABG.  There were areas of scarring in the lungs, but no acute cardiopulmonary disease.  Changes of cirrhosis with upper abdominal varices and splenomegaly was seen as well.  An ultrasound ordered by Dr. Laural Golden showed unchanged cirrhosis with slightly increased moderate splenomegaly and gallbladder sludge with mild wall thickening presumably related to underlying liver disease in the absence of acute right upper quadrant pain.  Please see images and full radiology reports for further detail   Assessment & Plan: 1-Intractable abdominal pain -patient with underlying cirrhosis and portal HTN -CT scan demonstrating mildly dilated CBD and intrahepatic ducts. -AST was mildly elevated. -will follow GI recommendations for MRI/MRCP, Lipase and CLD advances -continue PPI and continue PRN analgesics  2-Mixed hyperlipidemia -holding zetia while technically NPO  3-CAD (coronary artery disease) -continue adjusted dose of metoprolol -holding Imdur while soft  BP present -patient denies CP -will d/c telemetry  4-Essential hypertension -currently with soft BP -holding diuretics  5-Cirrhosis,  non-alcoholic ([place]) -as mentioned above will hold diuretics for now -follow GI rec's  6-chronic combined HF -compensated currently -follow daily weights and strict I's and O's -continue b-blocker, 1/2 dose.  7-Paroxysmal atrial fibrillation ([facility]CC) -will continue b-blocker atr half dose -rate controllled  8-thrombocytopenia -chronic in the setting of cirrhosis and splenomegaly -continue avoiding heparin products -SCD's for DVT prophylaxis ordered   9-Anxiety with depression -no SI or hallucinations -will resume lexapro   DVT prophylaxis: SCD's Code Status: Full code.  Family Communication: no family at bedside  Disposition Plan: remains inpatient, slowly advance diet, continue PRN analgesics and antiemetics. Follow GI service recommendations.   Consultants:   GI service.   Procedures:   See below for x-ray reports.   Antimicrobials:  Anti-infectives (From admission, onward)   Start     Dose/Rate Route Frequency Ordered Stop   05/19/19 2015  ciprofloxacin (CIPRO) IVPB 400 mg     400 mg 200 mL/hr over 60 Minutes Intravenous  Once 05/19/19 2001 05/19/19 2145       Subjective: Afebrile, no CP, no SOB. Still with intermittent abd pain and nausea; reports good relieved to IV analgesics.   Objective: Vitals:   05/20/19 0540 05/20/19 0750 05/20/19 1000 05/20/19 1400  BP: (!) 116/47  (!) 105/42 (!) 91/47  Pulse: 75  79 75  Resp: 20  17 17   Temp: 98.2 F (36.8 C)   97.9 F (36.6 C)  TempSrc: Oral   Oral  SpO2: 91% 92% 93% 96%  Weight:      Height:        Intake/Output Summary (Last 24 hours) at 05/20/2019 1642 Last data filed at 05/20/2019 1300 Gross per 24 hour  Intake 2200 ml  Output --  Net 2200 ml   Filed Weights   05/19/19 1819 05/19/19 2307  Weight: 81.6 kg 86.3 kg    Examination: General exam: Alert, awake, oriented x 3, positive icterus on exam; reports intermittent RUQ pain and nausea, no further vomiting. No CP, no SOB, no fever. Respiratory  system: Clear to auscultation. Respiratory effort normal. Cardiovascular system:RRR. No murmurs, rubs, gallops. Gastrointestinal system: Abdomen is nondistended, soft and just mildly tender to deep palpation. No organomegaly or masses felt. Normal bowel sounds heard. Central nervous system: Alert and oriented. No focal neurological deficits. Extremities: No cyanosis or clubbing  Skin: No rashes, no petechiae, positive mild jaundice on her skin appreciated. Psychiatry: Judgement and insight appear normal. Mood & affect appropriate.     Data Reviewed: I have personally reviewed following labs and imaging studies  CBC: Recent Labs  Lab 05/15/19 1428 05/17/19 1740 05/19/19 1849 05/20/19 0505  WBC 4.0 5.1 5.6 2.6*  NEUTROABS 2.6 3.6 3.9 1.3*  HGB 12.7 12.1 12.6 10.9*  HCT 41.9 39.7 40.3 35.8*  MCV 95.9 97.3 94.8 95.7  PLT 62* 55* 64* 46*   Basic Metabolic Panel: Recent Labs  Lab 05/15/19 1428 05/17/19 1740 05/19/19 1849 05/20/19 0505  NA 140 141 139 140  K 4.0 3.9 3.6 3.7  CL 103 104 102 105  CO2 26 29 28 28   GLUCOSE 196* 154* 129* 95  BUN 11 13 16 16   CREATININE 1.03* 1.42* 1.43* 1.31*  CALCIUM 8.9 8.9 8.6* 8.1*  MG  --   --  1.8  --   PHOS  --   --  2.7  --    GFR: Estimated  Creatinine Clearance: 41.6 mL/min (A) (by C-G formula based on SCr of 1.31 mg/dL (H)).   Liver Function Tests: Recent Labs  Lab 05/15/19 1428 05/17/19 1740 05/19/19 1849 05/20/19 0505  AST 43* 52* 51* 41  ALT 17 16 17 16   ALKPHOS 79 76 81 74  BILITOT 2.3* 2.5* 2.1* 1.4*  PROT 7.3 6.7 7.1 6.3*  ALBUMIN 3.1* 3.0* 3.2* 2.7*   Recent Labs  Lab 05/17/19 1740 05/19/19 1849  LIPASE 37 46   Coagulation Profile: Recent Labs  Lab 05/15/19 1428  INR 1.4*   Urine analysis:    Component Value Date/Time   COLORURINE YELLOW 05/19/2019 2015   APPEARANCEUR HAZY (A) 05/19/2019 2015   LABSPEC 1.013 05/19/2019 2015   PHURINE 5.0 05/19/2019 2015   GLUCOSEU NEGATIVE 05/19/2019 2015   HGBUR  NEGATIVE 05/19/2019 2015   [place] NEGATIVE 05/19/2019 2015   KETONESUR NEGATIVE 05/19/2019 2015   PROTEINUR NEGATIVE 05/19/2019 2015   NITRITE NEGATIVE 05/19/2019 2015   LEUKOCYTESUR NEGATIVE 05/19/2019 2015    Recent Results (from the past 240 hour(s))  SARS CORONAVIRUS 2 (TAT 6-24 HRS) Nasopharyngeal Nasopharyngeal Swab     Status: None   Collection Time: 05/15/19  6:48 AM   Specimen: Nasopharyngeal Swab  Result Value Ref Range Status   SARS Coronavirus 2 NEGATIVE NEGATIVE Final    Comment: (NOTE) SARS-CoV-2 target nucleic acids are NOT DETECTED. The SARS-CoV-2 RNA is generally detectable in upper and lower respiratory specimens during the acute phase of infection. Negative results do not preclude SARS-CoV-2 infection, do not rule out co-infections with other pathogens, and should not be used as the sole basis for treatment or other patient management decisions. Negative results must be combined with clinical observations, patient history, and epidemiological information. The expected result is Negative. Fact Sheet for Patients: SugarRoll.be Fact Sheet for Healthcare Providers: https://www.woods-mathews.com/ This test is not yet approved or cleared by the [place] FDA and  has been authorized for detection and/or diagnosis of SARS-CoV-2 by FDA under an Emergency Use Authorization (EUA). This EUA will remain  in effect (meaning this test can be used) for the duration of the COVID-19 declaration under Section 56 4(b)(1) of the Act, 21 U.S.C. section 360bbb-3(b)(1), unless the authorization is terminated or revoked sooner. Performed at [facility], [place] [address] [place], [place] [address]     Radiology Studies: CT ABDOMEN PELVIS W CONTRAST  Result Date: 05/19/2019 CLINICAL DATA:  Abdominal pain EXAM: CT ABDOMEN AND PELVIS WITH CONTRAST TECHNIQUE: Multidetector CT imaging of the abdomen and pelvis was performed using  the standard protocol following bolus administration of intravenous contrast. CONTRAST:  66m OMNIPAQUE IOHEXOL 300 MG/ML  SOLN COMPARISON:  None. FINDINGS: Lower chest: Mild bibasilar scarring. Hepatobiliary: Cirrhosis. Gallbladder is unremarkable. Mild central intrahepatic duct dilatation. Common bile duct is mildly dilated but tapers distally. Pancreas: Unremarkable. Spleen: Splenomegaly Adrenals/Urinary Tract: Bilateral nonobstructing renal calculi measuring up to 3 mm. Adrenals are unremarkable. The bladder is obscured by hip arthroplasty. Stomach/Bowel: Stomach is within limits apart from varices. Bowel is normal in caliber. Bowel loops in the pelvis are obscured by artifact. Normal appendix. Vascular/Lymphatic: Prominent gastric varices. Paraumbilical collateral veins. Small lower paraesophageal varices. Portal vein is patent. Diffuse aortic atherosclerosis. Reproductive: Pelvis is obscured by artifact. Other: No ascites. Musculoskeletal: Postoperative changes L4-L5 and L5-S1. Chronic compression deformity of L1 with cement augmentation right hip arthroplasty with streak artifact IMPRESSION: Cirrhosis with evidence of portal hypertension.  No ascites. Mild central intrahepatic duct dilatation. Common bile duct  is mildly dilated but tapers distally. Small nonobstructing renal calculi. Electronically Signed   By: Macy Mis M.D.   On: 05/19/2019 21:01   DG Chest Port 1 View  Result Date: 05/19/2019 CLINICAL DATA:  Shortness of breath EXAM: PORTABLE CHEST 1 VIEW COMPARISON:  05/17/2019 FINDINGS: Cardiomegaly, vascular congestion. Prior CABG. Lingular atelectasis. No confluent opacities or effusions. No acute bony abnormality. IMPRESSION: Cardiomegaly with vascular congestion. Lingular atelectasis. Electronically Signed   By: Rolm Baptise M.D.   On: 05/19/2019 21:05   ECHOCARDIOGRAM COMPLETE  Result Date: 05/20/2019    ECHOCARDIOGRAM REPORT   Patient Name:   KARNISHA LEFEBRE Date of Exam: 05/20/2019  Medical Rec #:  [id number]      Height:       63.0 in Accession #:    [id number]     Weight:       190.2 lb Date of Birth:  Dec 25, 1950      BSA:          1.893 m Patient Age:    65 years       BP:           116/47 mmHg Patient Gender: F              HR:           75 bpm. Exam Location:  [facility] Procedure: 2D Echo Indications:    Chest Pain 786.50 / R07.9                 Congestive Heart Failure 428.0 / I50.9  History:        Patient has prior history of Echocardiogram examinations, most                 recent 11/15/2017. CHF, CAD, Prior CABG, Arrythmias:Atrial                 Fibrillation; Risk Factors:Hypertension, Dyslipidemia and Former                 Smoker.  Sonographer:    Leavy Cella RDCS (AE) Referring Phys: [id number] DAVID MANUEL [place]  1. Left ventricular ejection fraction, by estimation, is 50 to 55%. The left ventricle has low normal function. The left ventricle has no regional wall motion abnormalities. There is mild left ventricular hypertrophy. Left ventricular diastolic parameters are indeterminate.  2. Right ventricular systolic function is low normal. The right ventricular size is normal. There is normal pulmonary artery systolic pressure. The estimated right ventricular systolic pressure is 30.8 mmHg.  3. Left atrial size was moderately dilated.  4. Right atrial size was mildly dilated.  5. The mitral valve is grossly normal. Mild mitral valve regurgitation.  6. Tricuspid valve regurgitation is mild to moderate.  7. The aortic valve is tricuspid. Aortic valve regurgitation is not visualized.  8. The inferior vena cava is normal in size with greater than 50% respiratory variability, suggesting right atrial pressure of 3 mmHg. FINDINGS  Left Ventricle: Left ventricular ejection fraction, by estimation, is 50 to 55%. The left ventricle has low normal function. The left ventricle has no regional wall motion abnormalities. The left ventricular internal cavity size was normal in size.  There is mild left ventricular hypertrophy. Left ventricular diastolic parameters are indeterminate. Right Ventricle: The right ventricular size is normal. No increase in right ventricular wall thickness. Right ventricular systolic function is low normal. There is normal pulmonary artery systolic pressure. The tricuspid regurgitant velocity is 2.46 m/s,  and with an  assumed right atrial pressure of 3 mmHg, the estimated right ventricular systolic pressure is 47.6 mmHg. Left Atrium: Left atrial size was moderately dilated. Right Atrium: Right atrial size was mildly dilated. Pericardium: There is no evidence of pericardial effusion. Mitral Valve: The mitral valve is grossly normal. Mild mitral valve regurgitation. Tricuspid Valve: The tricuspid valve is grossly normal. Tricuspid valve regurgitation is mild to moderate. Aortic Valve: The aortic valve is tricuspid. Aortic valve regurgitation is not visualized. Mild aortic valve annular calcification. Pulmonic Valve: The pulmonic valve was grossly normal. Pulmonic valve regurgitation is trivial. Aorta: The aortic root is normal in size and structure. Venous: The inferior vena cava is normal in size with greater than 50% respiratory variability, suggesting right atrial pressure of 3 mmHg. IAS/Shunts: No atrial level shunt detected by color flow Doppler.  LEFT VENTRICLE PLAX 2D LVIDd:         4.70 cm  Diastology LVIDs:         3.41 cm  LV e' medial:   5.44 cm/s LV PW:         1.20 cm  LV E/e' medial: 25.6 LV IVS:        1.09 cm LVOT diam:     1.90 cm LV SV:         68 LV SV Index:   36 LVOT Area:     2.84 cm  RIGHT VENTRICLE RV S prime:     6.98 cm/s TAPSE (M-mode): 1.9 cm LEFT ATRIUM             Index LA diam:        5.10 cm 2.69 cm/m LA Vol (A2C):   89.7 ml 47.39 ml/m LA Vol (A4C):   73.8 ml 38.99 ml/m LA Biplane Vol: 81.9 ml 43.27 ml/m  AORTIC VALVE LVOT Vmax:   95.00 cm/s LVOT Vmean:  67.400 cm/s LVOT VTI:    0.241 m  AORTA Ao Root diam: 2.40 cm MITRAL VALVE                 TRICUSPID VALVE MV Area (PHT): 3.31 cm     TR Peak grad:   24.2 mmHg MV Decel Time: 229 msec     TR Vmax:        246.00 cm/s MV E velocity: 139.00 cm/s MV A velocity: 102.00 cm/s  SHUNTS MV E/A ratio:  1.36         Systemic VTI:  0.24 m                             Systemic Diam: 1.90 cm Rozann Lesches MD Electronically signed by Rozann Lesches MD Signature Date/Time: 05/20/2019/2:36:36 PM    Final     Scheduled Meds: . FLUoxetine  20 mg Oral Daily  . metoprolol succinate  12.5 mg Oral Daily  . pantoprazole  40 mg Oral Daily   Continuous Infusions:   LOS: 0 days    Time spent: 30 minutes.     Barton Dubois, MD [facility] Pager [phone]   05/20/2019, 4:42 PM

## 2019-05-20 NOTE — Consult Note (Signed)
Referring Provider: Dr. Dyann Kief  Primary Care Physician:  [name], MD Primary Gastroenterologist:  Dr. Laural Golden   Date of Admission: 05/19/19 Date of Consultation: 05/20/19  Reason for Consultation:  RUQ pain, history of cirrhosis and portal hypertension  HPI:  Samantha [name] is a 69 y.o. year old female with a history of cirrhosis secondary to NASH, IDA, Grade 1 and 2 varices, gastric varices, portal gastropathy on last EGD in Oct 2018, presenting with worsening RUQ pain over the past few weeks. CT abd/pelvis with contrast March 2021 with cirrhosis and evidence of portal hypertension, mild central intrahepatic duct dilation, CBD mildly dilated but tapers distally, small non-obstructing renal calculi. [place] March 2021 as outpatient for routine purposes with gallbladder sludge and mild wall thickening, felt to be related to underlying liver disease. LFTs reviewed with Tbili 2.5 several days ago now trending down and 1.4 near her baseline, but no bumps in transaminases. Lipase normal yesterday.   Epigastric and RUQ pain onset about 1-2 weeks ago. Pain starts in upper abdomen and radiates across to RUQ. Friday had cataract surgery here then went home. An hour later thought she was going to die. States pain is dead center in epigastric area and spreads to the right, described as a knife twisting and radiating through to her back. Pain intermittent. Pain not precipitated by eating. Has not had food since Sunday. Presented to the ED as pain episodes were worsening. Associated N/V. Vomiting Friday and Sunday. At first, she thought this was heart related. No fever or chills. No solid food dysphagia unless mouth is dry. No yellowing of skin or eyes. Back surgery in past with cage. She has had MRIs since this time.   Itching after pain medication. Rare Ibuprofen for headaches and won't take more than two 200 mg at one time. No overt GI bleeding.   Past Medical History:  Diagnosis Date  . Cataract    OU  .  Chronic combined systolic and diastolic CHF (congestive heart failure) ([place])   . Cirrhosis of liver ([place])   . CKD (chronic kidney disease), stage III   . Coronary artery disease    a. s/p CABG 2001 w/ (LIMA-OM, SVG-D1, SVG-RCA). b. h/o multiple PCIs per Dr. Antionette Char note.  . Depression   . Esophageal varices ([place])    New 2013  . Gastroesophageal reflux disease   . History of pneumonia   . Hyperlipidemia   . Hypertension   . Hypertensive retinopathy    OU  . Obesity   . Osteoarthritis   . Pancytopenia ([place])   . Paroxysmal atrial flutter ([place])    a. dx 05/2016.  [name] Persistent atrial fibrillation ([place])    a. reported in hosp 07/2016, not on anticoag due to cirrhosis and liver disease, low platelets, varices.  . Thrombocytopenia ([place])     Past Surgical History:  Procedure Laterality Date  . ABDOMINAL HYSTERECTOMY    . BACK SURGERY    . CARDIAC CATHETERIZATION  2004   left internal mammary artery to the obtuse marginal  was found to be small and thread like.  The two grafts were patent.  The left circumflex had 90% in-stent restenosis and cutting balloon angioplasty was performed followed by placement of a 3.0 x 69m Taxus drug -eluting stent.    .Marland KitchenCARDIAC CATHETERIZATION  2006   There was in-stent restenosis in the left circumflex and this was treated with cutting balloon angioplasty   . CARDIAC CATHETERIZATION  2008   vein graft  to the to the obtuse marginal was patent, although small, left circumflex had 40% in-stent restenosis, ejection fraction 40-45%.  The patient was medically mananged.  [name] CARDIAC CATHETERIZATION N/A 01/09/2015   Procedure: Left Heart Cath and Cors/Grafts Angiography;  Surgeon: Belva Crome, MD;  Location: [facility];  Service: Cardiovascular;  Laterality: N/A;  . CATARACT EXTRACTION W/PHACO Right 05/17/2019   Procedure: CATARACT EXTRACTION PHACO AND INTRAOCULAR LENS PLACEMENT (IOC) (CDE: 14.99);  Surgeon: Baruch Goldmann, MD;  Location: [facility];  Service:  Ophthalmology;  Laterality: Right;  . cataract sx    . COLONOSCOPY  03/29/2011   Procedure: COLONOSCOPY;  Surgeon: Jamesetta So, MD;  [facility];  Service: Gastroenterology;  Laterality: N/A;  . CORONARY ARTERY BYPASS GRAFT  May 31,2001   x 3 with a vein graft to the first diagonal, vein graft to the right coronary  artery, and a free left internal mammary  artery to the obtuse marginal   . ESOPHAGEAL BANDING N/A 07/04/2012   Procedure: ESOPHAGEAL BANDING;  Surgeon: Rogene Houston, MD;  [facility];  Service: Endoscopy;  Laterality: N/A;  . ESOPHAGEAL BANDING N/A 09/17/2012   Procedure: ESOPHAGEAL BANDING;  Surgeon: Rogene Houston, MD;  [facility];  Service: Endoscopy;  Laterality: N/A;  . ESOPHAGEAL BANDING N/A 10/22/2013   Procedure: ESOPHAGEAL BANDING;  Surgeon: Rogene Houston, MD;  [facility];  Service: Endoscopy;  Laterality: N/A;  . ESOPHAGEAL BANDING N/A 11/28/2014   Procedure: ESOPHAGEAL BANDING;  Surgeon: Rogene Houston, MD;  [facility];  Service: Endoscopy;  Laterality: N/A;  . ESOPHAGEAL BANDING N/A 10/29/2015   Procedure: ESOPHAGEAL BANDING;  Surgeon: Rogene Houston, MD;  [facility];  Service: Endoscopy;  Laterality: N/A;  . ESOPHAGOGASTRODUODENOSCOPY  12/20/2011   Procedure: ESOPHAGOGASTRODUODENOSCOPY (EGD);  Surgeon: Jamesetta So, MD;  [facility];  Service: Gastroenterology;  Laterality: N/A;  . ESOPHAGOGASTRODUODENOSCOPY N/A 07/04/2012   Procedure: ESOPHAGOGASTRODUODENOSCOPY (EGD);  Surgeon: Rogene Houston, MD;  [facility];  Service: Endoscopy;  Laterality: N/A;  235-moved to 255 Ann to notify pt  . ESOPHAGOGASTRODUODENOSCOPY N/A 09/17/2012   Procedure: ESOPHAGOGASTRODUODENOSCOPY (EGD);  Surgeon: Rogene Houston, MD;  [facility];  Service: Endoscopy;  Laterality: N/A;  730  . ESOPHAGOGASTRODUODENOSCOPY N/A 10/22/2013   Procedure: ESOPHAGOGASTRODUODENOSCOPY (EGD);   Surgeon: Rogene Houston, MD;  [facility];  Service: Endoscopy;  Laterality: N/A;  730  . ESOPHAGOGASTRODUODENOSCOPY N/A 11/28/2014   Procedure: ESOPHAGOGASTRODUODENOSCOPY (EGD);  Surgeon: Rogene Houston, MD;  [facility];  Service: Endoscopy;  Laterality: N/A;  1:25  . ESOPHAGOGASTRODUODENOSCOPY N/A 10/29/2015   Procedure: ESOPHAGOGASTRODUODENOSCOPY (EGD);  Surgeon: Rogene Houston, MD;  [facility];  Service: Endoscopy;  Laterality: N/A;  12:00  . ESOPHAGOGASTRODUODENOSCOPY N/A 12/12/2016   Procedure: ESOPHAGOGASTRODUODENOSCOPY (EGD);  Surgeon: Rogene Houston, MD;  [facility];  Service: Endoscopy;  Laterality: N/A;  225  . FLEXIBLE SIGMOIDOSCOPY N/A 03/20/2017   Procedure: FLEXIBLE SIGMOIDOSCOPY;  Surgeon: Rogene Houston, MD;  [facility];  Service: Endoscopy;  Laterality: N/A;  7:30  . JOINT REPLACEMENT    . LEFT HEART CATH AND CORS/GRAFTS ANGIOGRAPHY N/A 08/15/2016   Procedure: Left Heart Cath and Cors/Grafts Angiography;  Surgeon: Jettie Booze, MD;  Location: [facility];  Service: Cardiovascular;  Laterality: N/A;  . LEFT HEART CATHETERIZATION WITH CORONARY/GRAFT ANGIOGRAM N/A 12/25/2013  Procedure: LEFT HEART CATHETERIZATION WITH Beatrix Fetters;  Surgeon: Blane Ohara, MD;  Location: [facility];  Service: Cardiovascular;  Laterality: N/A;  . RIGHT HEART CATH N/A 05/29/2017   Procedure: RIGHT HEART CATH;  Surgeon: Jolaine Artist, MD;  Location: [facility];  Service: Cardiovascular;  Laterality: N/A;  . rotator cuff left  2007  . TONSILLECTOMY      Prior to Admission medications   Medication Sig Start Date End Date Taking? Authorizing Provider  albuterol (PROVENTIL HFA;VENTOLIN HFA) 108 (90 BASE) MCG/ACT inhaler Inhale 2 puffs into the lungs every 6 (six) hours as needed for wheezing or shortness of breath.    Yes [provider]  ezetimibe (ZETIA) 10 MG tablet Take 10 mg by  mouth at bedtime.    Yes [provider]  FLUoxetine (PROZAC) 20 MG capsule Take 20 mg by mouth daily.    Yes [provider]  HYDROcodone-acetaminophen (NORCO) 10-325 MG tablet Take 1 tablet by mouth every 6 (six) hours as needed for moderate pain.  11/06/17  Yes [provider]  hydroxypropyl methylcellulose / hypromellose (ISOPTO TEARS / GONIOVISC) 2.5 % ophthalmic solution Place 1 drop into both eyes 3 (three) times daily as needed for dry eyes.   Yes [provider]  isosorbide mononitrate (IMDUR) 60 MG 24 hr tablet Take 1.5 tablets (90 mg total) by mouth daily. 04/18/19 04/12/20 Yes Rolland Porter, MD  metoprolol succinate (TOPROL XL) 25 MG 24 hr tablet Take 1 tablet (25 mg total) by mouth daily. 03/14/18  Yes Weaver, Scott T, PA-C  NITROSTAT 0.4 MG SL tablet Place 0.4 mg under the tongue every 5 (five) minutes as needed for chest pain (x 3 tabs daily).  11/15/13  Yes [provider]  pantoprazole (PROTONIX) 40 MG tablet Take 40 mg by mouth daily.   Yes [provider]  polyethylene glycol (MIRALAX / GLYCOLAX) packet Take 17 g by mouth daily.   Yes [provider]  spironolactone (ALDACTONE) 25 MG tablet Take 1 tablet (25 mg total) by mouth daily. 08/01/18 07/27/19 Yes Sherren Mocha, MD  torsemide (DEMADEX) 20 MG tablet Take 2 tablets (40 mg total) by mouth daily. 11/15/17 05/19/19 Yes Sherren Mocha, MD  valsartan (DIOVAN) 160 MG tablet Take 1 tablet (160 mg total) by mouth at bedtime. 06/15/17  Yes Shirley Friar, PA-C    Current Facility-Administered Medications  Medication Dose Route Frequency Provider Last Rate Last Admin  . albuterol (VENTOLIN HFA) 108 (90 Base) MCG/ACT inhaler 2 puff  2 puff Inhalation Q6H PRN Reubin Milan, MD      . diphenhydrAMINE (BENADRYL) capsule 25 mg  25 mg Oral Q6H PRN Reubin Milan, MD   25 mg at 05/20/19 1209  . HYDROmorphone (DILAUDID) injection 1 mg  1 mg Intravenous Q3H PRN Reubin Milan, MD   1 mg at 05/20/19 1256  . nitroGLYCERIN (NITROSTAT) SL tablet 0.4 mg  0.4 mg Sublingual Q5 min PRN Reubin Milan, MD      . polyvinyl alcohol (LIQUIFILM TEARS) 1.4 % ophthalmic solution 1 drop  1 drop Both Eyes TID PRN Reubin Milan, MD      . prochlorperazine (COMPAZINE) injection 5 mg  5 mg Intravenous Q4H PRN Reubin Milan, MD        Allergies as of 05/19/2019 - Review Complete 05/19/2019  Allergen Reaction Noted  . Entresto [sacubitril-valsartan] Swelling 05/25/2017  . Acetaminophen Other (See Comments) 01/08/2015  . Oxycodone Itching and  Nausea Only 01/08/2015  . Ace inhibitors Other (See Comments) 07/04/2012  . Cefaclor Itching and Rash 06/23/2008  . Cephalexin Itching and Rash 06/23/2008  . Penicillins Rash 06/23/2008  . Pregabalin Other (See Comments) 06/23/2011  . Tape Itching and Rash 06/19/2013    Family History  Problem Relation Age of Onset  . Heart attack Mother   . Diabetes Mother   . Heart attack Father 69       cause of death  . Diabetes Father   . Coronary artery disease Sister        CABG   . Diabetes Sister   . Glaucoma Sister   . Diabetes Brother   . Colon cancer Neg Hx     Social History   Socioeconomic History  . Marital status: Divorced    Spouse name: Not on file  . Number of children: Not on file  . Years of education: Not on file  . Highest education level: Not on file  Occupational History  . Occupation: Disabled  Tobacco Use  . Smoking status: Former Smoker    Packs/day: 0.50    Years: 20.00    Pack years: 10.00    Types: Cigarettes    Quit date: 07/21/1995    Years since quitting: 23.8  . Smokeless tobacco: Never Used  Substance and Sexual Activity  . Alcohol use: No  . Drug use: No  . Sexual activity: Not on file  Other Topics Concern  . Not on file  Social History Narrative   Lives in [place] by herself.     Social Determinants of Health   Financial Resource Strain: Low Risk   . Difficulty  of Paying Living Expenses: Not hard at all  Food Insecurity: No Food Insecurity  . Worried About Charity fundraiser in the Last Year: Never true  . Ran Out of Food in the Last Year: Never true  Transportation Needs: No Transportation Needs  . Lack of Transportation (Medical): No  . Lack of Transportation (Non-Medical): No  Physical Activity: Inactive  . Days of Exercise per Week: 0 days  . Minutes of Exercise per Session: 0 min  Stress: No Stress Concern Present  . Feeling of Stress : Only a little  Social Connections:   . Frequency of Communication with Friends and Family:   . Frequency of Social Gatherings with Friends and Family:   . Attends Religious Services:   . Active Member of Clubs or Organizations:   . Attends Archivist Meetings:   [name] Marital Status:   Intimate Partner Violence: Not At Risk  . Fear of Current or Ex-Partner: No  . Emotionally Abused: No  . Physically Abused: No  . Sexually Abused: No    Review of Systems: Gen: see HPI CV: Denies chest pain, heart palpitations, syncope, edema  Resp: Denies shortness of breath with rest, cough, wheezing GI: see HPI GU : Denies urinary burning, urinary frequency, urinary incontinence.  MS: Denies joint pain,swelling, cramping Derm: see HPI  Psych: Denies depression, anxiety,confusion, or memory loss Heme: see HPI  Physical Exam: Vital signs in last 24 hours: Temp:  [97.5 F (36.4 C)-98.2 F (36.8 C)] 98.2 F (36.8 C) (03/22 0540) Pulse Rate:  [61-79] 79 (03/22 1000) Resp:  [11-20] 17 (03/22 1000) BP: (87-118)/(37-92) 105/42 (03/22 1000) SpO2:  [87 %-98 %] 93 % (03/22 1000) Weight:  [81.6 kg-86.3 kg] 86.3 kg (03/21 2307)   General:   Alert,  Well-developed, well-nourished, pleasant and cooperative in NAD,  somewhat sallow-appearing complexion Head:  Normocephalic and atraumatic. Eyes:  Sclera clear, no icterus.    Ears:  Normal auditory acuity. Lungs:  Clear throughout to auscultation.    Heart:   S1 S2 present with systolic murmur  Abdomen:  Soft, mild to moderate TTP epigastric and RUQ and nondistended. No masses, hepatosplenomegaly. Small umbilical hernia. Normal bowel sounds, without guarding, and without rebound.   Rectal:  Deferred  Msk:  Symmetrical without gross deformities. Normal posture. Extremities:  Without  edema. Neurologic:  Alert and  oriented x4 Psych:  Alert and cooperative. Normal mood and affect.  Intake/Output from previous day: 03/21 0701 - 03/22 0700 In: 2200 [IV Piggyback:2200] Out: -  Intake/Output this shift: No intake/output data recorded.  Lab Results: Recent Labs    05/17/19 1740 05/19/19 1849 05/20/19 0505  WBC 5.1 5.6 2.6*  HGB 12.1 12.6 10.9*  HCT 39.7 40.3 35.8*  PLT 55* 64* 46*   BMET Recent Labs    05/17/19 1740 05/19/19 1849 05/20/19 0505  NA 141 139 140  K 3.9 3.6 3.7  CL 104 102 105  CO2 29 28 28   GLUCOSE 154* 129* 95  BUN 13 16 16   CREATININE 1.42* 1.43* 1.31*  CALCIUM 8.9 8.6* 8.1*   LFT Recent Labs    05/17/19 1740 05/19/19 1849 05/20/19 0505  PROT 6.7 7.1 6.3*  ALBUMIN 3.0* 3.2* 2.7*  AST 52* 51* 41  ALT 16 17 16   ALKPHOS 76 81 74  BILITOT 2.5* 2.1* 1.4*  BILIDIR 0.9*  --   --   IBILI 1.6*  --   --    Lab Results  Component Value Date   LIPASE 46 05/19/2019    Studies/Results: CT ABDOMEN PELVIS W CONTRAST  Result Date: 05/19/2019 CLINICAL DATA:  Abdominal pain EXAM: CT ABDOMEN AND PELVIS WITH CONTRAST TECHNIQUE: Multidetector CT imaging of the abdomen and pelvis was performed using the standard protocol following bolus administration of intravenous contrast. CONTRAST:  94m OMNIPAQUE IOHEXOL 300 MG/ML  SOLN COMPARISON:  None. FINDINGS: Lower chest: Mild bibasilar scarring. Hepatobiliary: Cirrhosis. Gallbladder is unremarkable. Mild central intrahepatic duct dilatation. Common bile duct is mildly dilated but tapers distally. Pancreas: Unremarkable. Spleen: Splenomegaly Adrenals/Urinary Tract: Bilateral  nonobstructing renal calculi measuring up to 3 mm. Adrenals are unremarkable. The bladder is obscured by hip arthroplasty. Stomach/Bowel: Stomach is within limits apart from varices. Bowel is normal in caliber. Bowel loops in the pelvis are obscured by artifact. Normal appendix. Vascular/Lymphatic: Prominent gastric varices. Paraumbilical collateral veins. Small lower paraesophageal varices. Portal vein is patent. Diffuse aortic atherosclerosis. Reproductive: Pelvis is obscured by artifact. Other: No ascites. Musculoskeletal: Postoperative changes L4-L5 and L5-S1. Chronic compression deformity of L1 with cement augmentation right hip arthroplasty with streak artifact IMPRESSION: Cirrhosis with evidence of portal hypertension.  No ascites. Mild central intrahepatic duct dilatation. Common bile duct is mildly dilated but tapers distally. Small nonobstructing renal calculi. Electronically Signed   By: PMacy MisM.D.   On: 05/19/2019 21:01   DG Chest Port 1 View  Result Date: 05/19/2019 CLINICAL DATA:  Shortness of breath EXAM: PORTABLE CHEST 1 VIEW COMPARISON:  05/17/2019 FINDINGS: Cardiomegaly, vascular congestion. Prior CABG. Lingular atelectasis. No confluent opacities or effusions. No acute bony abnormality. IMPRESSION: Cardiomegaly with vascular congestion. Lingular atelectasis. Electronically Signed   By: KRolm BaptiseM.D.   On: 05/19/2019 21:05    Impression: 69year old female with history of NASH cirrhosis, presenting with 1-2 week history of intermittent epigastric/RUQ pain with no precipitating factors, associated N/V,  and concern for biliary etiology. [place] on file from last week, completed for routine purposes in setting of known cirrhosis: no gallstones but gallbladder sludge noted. Felt to have mild wall thickening related to underlying liver disease at that time. CT yesterday with mild intrahepatic ductal dilation, with CBD mildly dilated and tapering distally. I note her bilirubin had just  slightly bumped recently from baseline to 2.5 several days ago with mildly elevated AST at 52. Direct and indirect both elevated (0.9 and 1.6, respectively, at that time). Lipase normal yesterday. Will proceed with MRI/MRCP as soon as possible; MRI is not available on Mondays, so this will be pursued on Tuesday morning. Hold off on repeat ultrasound as this was just done one week ago.   Normocytic anemia with Hgb 10.9, baseline in 12 range. Likely dilutional.     Plan: MRI/MRCP tomorrow May have clear liquids today HFP, lipase, CBC in am Add PPI daily Dr. Laural Golden to see tomorrow when he returns   Annitta Needs, PhD, ANP-BC [facility] Gastroenterology     LOS: 0 days    05/20/2019, 2:18 PM

## 2019-05-20 NOTE — Progress Notes (Signed)
[facility] night shift.  The staff reports that the patient has developed itching due to the opioid medication and is requesting diphenhydramine.  I will give an initial 25 mg IV dose for quick relief and then order oral capsules as needed in case pruritus recurs.  Tennis Must, MD

## 2019-05-20 NOTE — Patient Outreach (Signed)
[facility]) Care Management  05/20/2019  LUMINA GITTO [birth year] [id number]   RN Health CoachHospitalization  Referral Date:08/20/2018 Referral Source:[employer] High Risk List Reason for Referral:Assess Needs Insurance:[employer] Medicare   Outreach Attempt:  Received notification patient hospitalized at [facility] outpatient cataract surgery this past Friday, and has had 2 emergency room visits since surgery.  RN Health Coach notified [facility] Liaison of patients admission.  Plan:  RN Health Coach will await [facility] Liaisons recommendations for discharge follow up.  [place] [phone] [email or web address]

## 2019-05-20 NOTE — Progress Notes (Signed)
*  PRELIMINARY RESULTS* Echocardiogram 2D Echocardiogram has been performed.  Leavy Cella 05/20/2019, 9:44 AM

## 2019-05-21 ENCOUNTER — Inpatient Hospital Stay (HOSPITAL_COMMUNITY): Payer: Medicare Other

## 2019-05-21 DIAGNOSIS — K7460 Unspecified cirrhosis of liver: Secondary | ICD-10-CM

## 2019-05-21 DIAGNOSIS — D638 Anemia in other chronic diseases classified elsewhere: Secondary | ICD-10-CM

## 2019-05-21 DIAGNOSIS — R1013 Epigastric pain: Secondary | ICD-10-CM

## 2019-05-21 DIAGNOSIS — R109 Unspecified abdominal pain: Secondary | ICD-10-CM

## 2019-05-21 LAB — CBC WITH DIFFERENTIAL/PLATELET
Abs Immature Granulocytes: 0.01 10*3/uL (ref 0.00–0.07)
Basophils Absolute: 0 10*3/uL (ref 0.0–0.1)
Basophils Relative: 1 %
Eosinophils Absolute: 0.2 10*3/uL (ref 0.0–0.5)
Eosinophils Relative: 6 %
HCT: 36 % (ref 36.0–46.0)
Hemoglobin: 11.2 g/dL — ABNORMAL LOW (ref 12.0–15.0)
Immature Granulocytes: 0 %
Lymphocytes Relative: 25 %
Lymphs Abs: 0.8 10*3/uL (ref 0.7–4.0)
MCH: 29.6 pg (ref 26.0–34.0)
MCHC: 31.1 g/dL (ref 30.0–36.0)
MCV: 95.2 fL (ref 80.0–100.0)
Monocytes Absolute: 0.3 10*3/uL (ref 0.1–1.0)
Monocytes Relative: 9 %
Neutro Abs: 2 10*3/uL (ref 1.7–7.7)
Neutrophils Relative %: 59 %
Platelets: 46 10*3/uL — ABNORMAL LOW (ref 150–400)
RBC: 3.78 MIL/uL — ABNORMAL LOW (ref 3.87–5.11)
RDW: 16.5 % — ABNORMAL HIGH (ref 11.5–15.5)
WBC: 3.2 10*3/uL — ABNORMAL LOW (ref 4.0–10.5)
nRBC: 0 % (ref 0.0–0.2)

## 2019-05-21 LAB — HEPATIC FUNCTION PANEL
ALT: 15 U/L (ref 0–44)
AST: 40 U/L (ref 15–41)
Albumin: 2.8 g/dL — ABNORMAL LOW (ref 3.5–5.0)
Alkaline Phosphatase: 65 U/L (ref 38–126)
Bilirubin, Direct: 0.5 mg/dL — ABNORMAL HIGH (ref 0.0–0.2)
Indirect Bilirubin: 1.6 mg/dL — ABNORMAL HIGH (ref 0.3–0.9)
Total Bilirubin: 2.1 mg/dL — ABNORMAL HIGH (ref 0.3–1.2)
Total Protein: 6.6 g/dL (ref 6.5–8.1)

## 2019-05-21 LAB — LIPASE, BLOOD: Lipase: 24 U/L (ref 11–51)

## 2019-05-21 MED ORDER — GADOBUTROL 1 MMOL/ML IV SOLN
9.0000 mL | Freq: Once | INTRAVENOUS | Status: AC | PRN
  Administered 2019-05-21: 9 mL via INTRAVENOUS

## 2019-05-21 MED ORDER — TORSEMIDE 20 MG PO TABS
20.0000 mg | ORAL_TABLET | Freq: Every day | ORAL | Status: DC
  Administered 2019-05-22 – 2019-05-23 (×2): 20 mg via ORAL
  Filled 2019-05-21 (×2): qty 1

## 2019-05-21 MED ORDER — SPIRONOLACTONE 25 MG PO TABS
25.0000 mg | ORAL_TABLET | Freq: Every day | ORAL | Status: DC
  Administered 2019-05-22 – 2019-05-24 (×3): 25 mg via ORAL
  Filled 2019-05-21 (×4): qty 1

## 2019-05-21 MED ORDER — POLYETHYLENE GLYCOL 3350 17 G PO PACK
17.0000 g | PACK | Freq: Every day | ORAL | Status: DC
  Administered 2019-05-21 – 2019-05-24 (×4): 17 g via ORAL
  Filled 2019-05-21 (×4): qty 1

## 2019-05-21 NOTE — Progress Notes (Signed)
MRI results reviewed with patient. No abnormality noted on this study to account for patient's acute pain. Patient is now complaining of mild tenderness below the right costal margin. On exam she does not have mid epigastric tenderness that she had when I saw her this morning. She could have acalculus gallbladder disease or peptic ulcer disease. Will advance diet to heart healthy. Will evaluate patient in a.m. determine the next step

## 2019-05-21 NOTE — Progress Notes (Signed)
Patient states" I am unable to tolerate regular food and going to stick with ice chips". This nurse asked if the regular food causes pain, patient states it causes pain.

## 2019-05-21 NOTE — Progress Notes (Signed)
Subjective:  Patient continues to complain of midepigastric pain.  She says pain is more pronounced when she moves but not when she coughs.  Pain radiates into her back and towards the right scapular region.  Pain started on 05/17/2019 when she was seen in emergency room and released and she came back a day later.  No hematemesis melena fever or chills.  Patient says she has not had a BM in 2 days.  Objective: Blood pressure (!) 98/56, pulse 75, temperature 99.9 F (37.7 C), temperature source Oral, resp. rate 18, height 5' 3"  (1.6 m), weight 86.3 kg, last menstrual period 03/29/2011, SpO2 91 %. Patient is alert and appears to be in some pain. She has midsternal scar. Cardiac exam with regular rhythm normal S1 and S2. No murmur or gallop noted. Lungs are clear to auscultation. Abdomen is full.  Bowel sounds are normal.  On palpation abdomen is soft.  Spleen is easily palpable.  Palpable left lobe of the liver which is tender.  No bruit or rub noted. Nonpitting edema to both legs.  Labs/studies Results:  CBC Latest Ref Rng & Units 05/21/2019 05/20/2019 05/19/2019  WBC 4.0 - 10.5 K/uL 3.2(L) 2.6(L) 5.6  Hemoglobin 12.0 - 15.0 g/dL 11.2(L) 10.9(L) 12.6  Hematocrit 36.0 - 46.0 % 36.0 35.8(L) 40.3  Platelets 150 - 400 K/uL 46(L) 46(L) 64(L)    CMP Latest Ref Rng & Units 05/21/2019 05/20/2019 05/19/2019  Glucose 70 - 99 mg/dL - 95 129(H)  BUN 8 - 23 mg/dL - 16 16  Creatinine 0.44 - 1.00 mg/dL - 1.31(H) 1.43(H)  Sodium 135 - 145 mmol/L - 140 139  Potassium 3.5 - 5.1 mmol/L - 3.7 3.6  Chloride 98 - 111 mmol/L - 105 102  CO2 22 - 32 mmol/L - 28 28  Calcium 8.9 - 10.3 mg/dL - 8.1(L) 8.6(L)  Total Protein 6.5 - 8.1 g/dL 6.6 6.3(L) 7.1  Total Bilirubin 0.3 - 1.2 mg/dL 2.1(H) 1.4(H) 2.1(H)  Alkaline Phos 38 - 126 U/L 65 74 81  AST 15 - 41 U/L 40 41 51(H)  ALT 0 - 44 U/L 15 16 17     Hepatic Function Latest Ref Rng & Units 05/21/2019 05/20/2019 05/19/2019  Total Protein 6.5 - 8.1 g/dL 6.6 6.3(L) 7.1   Albumin 3.5 - 5.0 g/dL 2.8(L) 2.7(L) 3.2(L)  AST 15 - 41 U/L 40 41 51(H)  ALT 0 - 44 U/L 15 16 17   Alk Phosphatase 38 - 126 U/L 65 74 81  Total Bilirubin 0.3 - 1.2 mg/dL 2.1(H) 1.4(H) 2.1(H)  Bilirubin, Direct 0.0 - 0.2 mg/dL 0.5(H) - -    Serum lipase 24.  Assessment:  #1.  Right upper quadrant and epigastric pain of recent onset.  She had schedule ultrasound last week for [facility] screening revealing gallbladder wall thickening and sludge but no stones.  CT 2 days ago revealed cirrhotic liver splenomegaly mild dilation of biliary system without choledocholithiasis.  Her pain is suspicious for biliary colic however there have been no significant bump in her transaminases.  She has mildly elevated AST is felt to be due to fatty liver.  Exam this morning reveals very tender left lobe of the liver.  She could also have neoplastic process involving the left lobe not picked up on ultrasound and CT. If she does have choledocholithiasis she will have to be referred to tertiary center for therapeutic ERCP as she would require platelet transfusion etc.  #2.  Cirrhosis secondary to NASH complicated by esophageal and gastric varices.  Esophageal variceal banding not undertaken because she has developed gastric varices. She is up-to-date on [place] screening.  AFP in December 2020 was normal and ultrasound last week negative for focal abnormalities.  #3.  Mild anemia felt to be due to chronic disease.  Recommendations:  Await results of MRI. Polyethylene glycol 17 g by mouth p.o. daily.

## 2019-05-21 NOTE — Progress Notes (Signed)
PROGRESS NOTE    [name]  VWU:[id number] DOB: 03/06/1950 DOA: 05/19/2019 PCP: [name], MD     Brief Narrative:  As per H&P written by Dr. [name] on 05/19/19 69 y.o. 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  She was seen on Friday evening due to similar epigastric pain.  She mentions that she returned from cataract surgery in the morning and was sitting in her chair when she developed severe substernal sharp pain associated with nausea and 2 episodes of emesis.  She took 2 nitroglycerin tablets SL and four 81 mg aspirin without relief.  She mentions that the pain was severe enough for about 2 hours and call the EMS.  She mentions that her symptoms started improving while she was coming to the ED.  She denies fever, chills, diarrhea, but has been constipated for the past 2 days, although she has not eaten much.  She denies melena or hematochezia.  No dysuria, frequency or hematuria.  No dizziness, palpitations, diaphoresis, PND or orthopnea, but she develops lower extremity edema on occasion.  She denies polyuria, polydipsia, polyphagia or blurred vision.  ED Course: Initial vital signs temperature 97.5 F, pulse 73, respirations 13, blood pressure 97/47 mmHg and O2 sat 94% on room air.  Patient was given 400 mg of ciprofloxacin IVPB, hydromorphone 1 mg IVP, Zofran 4 mg IVP and 2000 mL of NS bolus.  Her urinalysis had a hazy appearance, but was otherwise unremarkable.  Initial lactic acid was 2.6 and follow-up was 1.4 mmol/L.  White count is 5.6 with a normal differential,  hemoglobin 12.6 g/dL and platelets 64.  Lipase was normal.  CMP has normal electrolytes when calcium is corrected to albumin.  Glucose 129, BUN 16 and creatinine 1.43 mg/dL (around patient's recent baseline creatinine level).  Total protein 7.1 and albumin 3.2 g/dL.  ALT was 51, ALT 17, alkaline phosphatase 81 and total bilirubin 2.1.  Imaging: Chest radiograph shows cardiomegaly with vascular congestion.  There was lingular atelectasis.  CT abdomen/pelvis shows cirrhosis with evidence of portal hypertension, but no ascites.  There was mild central intrahepatic duct dilation CBD is mildly dilated but tapers distally.  There is a small nonobstructive renal calculi. Two days ago, the patient had a CTA chest aorta which shows diffuse aortic atherosclerosis, but no aneurysm or dissection.  There was cardiomegaly with prior CABG.  There were areas of scarring in the lungs, but no acute cardiopulmonary disease.  Changes of cirrhosis with upper abdominal varices and splenomegaly was seen as well.  An ultrasound ordered by Dr. Laural Golden showed unchanged cirrhosis with slightly increased moderate splenomegaly and gallbladder sludge with mild wall thickening presumably related to underlying liver disease in the absence of acute right upper quadrant pain.  Please see images and full radiology reports for further detail   Assessment & Plan: 1-Intractable abdominal pain -patient with underlying cirrhosis and portal HTN -CT scan demonstrating mildly dilated CBD and intrahepatic ducts. -AST was mildly elevated on admission. -will follow results of MRI/MRCP as recommended by GI. -continue PPI and continue PRN analgesics  2-Mixed hyperlipidemia -holding zetia while technically NPO  3-CAD (coronary artery disease) -continue adjusted dose of metoprolol -holding Imdur while  soft BP present -patient denies CP -will d/c telemetry  4-Essential hypertension -currently with soft BP -holding diuretics  5-Cirrhosis,  non-alcoholic ([place]) -as mentioned above will hold diuretics for now -follow GI rec's  6-chronic combined HF -compensated currently -follow daily weights and strict I's and O's -continue b-blocker, 1/2 dose.  7-Paroxysmal atrial fibrillation (HCC) -will continue b-blocker atr half dose -rate controllled  8-thrombocytopenia -chronic in the setting of cirrhosis and splenomegaly -continue avoiding heparin products -SCD's for DVT prophylaxis ordered   9-Anxiety with depression -no SI or hallucinations -will continue lexapro -Stable mood overall.   DVT prophylaxis: SCD's Code Status: Full code.  Family Communication: no family at bedside  Disposition Plan: remains inpatient, reports tolerating some of clear liquid diet; will follow recommendation by GI service regarding further diet advancements. Continue PRN analgesics and antiemetics. Follow MRI results and further recommended evaluation.   Consultants:   GI service.   Procedures:   See below for x-ray reports.   Antimicrobials:  Anti-infectives (From admission, onward)   Start     Dose/Rate Route Frequency Ordered Stop   05/19/19 2015  ciprofloxacin (CIPRO) IVPB 400 mg     400 mg 200 mL/hr over 60 Minutes Intravenous  Once 05/19/19 2001 05/19/19 2145       Subjective: No fever, no chest pain, no shortness of breath.  Still complaining of intermittent abdominal right upper quadrant pain and having nausea.  Patient also expressed decreased appetite.  Objective: Vitals:   05/21/19 0510 05/21/19 0511 05/21/19 0551 05/21/19 1434  BP: (!) 98/56     Pulse: 76  75   Resp: 18     Temp: 99.9 F (37.7 C)     TempSrc: Oral     SpO2: (!) 80% 91% 91% 93%  Weight:      Height:        Intake/Output Summary (Last 24 hours) at 05/21/2019 1722 Last data filed at 05/21/2019 1300 Gross per 24 hour  Intake 720 ml  Output 800 ml  Net -80 ml   Filed Weights   05/19/19 1819 05/19/19 2307  Weight: 81.6 kg 86.3 kg     Examination: General exam: Alert, awake, oriented x 3; no chest pain, no shortness of breath.  Still reporting intermittent episode of right upper quadrant pain and nausea.  Reports some decrease in appetite.  Patient is afebrile.  Mild icterus appreciated on exam. Respiratory system: Clear to auscultation. Respiratory effort normal. Cardiovascular system:RRR. No murmurs, rubs, gallops. Gastrointestinal system: Abdomen is mildly distended, soft and with complaints of right upper quadrant pain on palpation; hepatomegaly palpated on exam.  Positive bowel sounds. Central nervous system: Alert and oriented. No focal neurological deficits. Extremities: No cyanosis or clubbing; trace edema bilaterally appreciated. Skin: No rashes, no petechiae. Psychiatry: Judgement and insight appear normal. Mood & affect appropriate.    Data Reviewed: I have personally reviewed following labs and imaging studies  CBC: Recent Labs  Lab 05/15/19 1428 05/17/19 1740 05/19/19 1849 05/20/19 0505 05/21/19 0640  WBC 4.0 5.1 5.6 2.6* 3.2*  NEUTROABS 2.6 3.6 3.9 1.3* 2.0  HGB 12.7 12.1 12.6 10.9* 11.2*  HCT 41.9 39.7 40.3 35.8* 36.0  MCV 95.9 97.3 94.8 95.7 95.2  PLT 62* 55* 64* 46* 46*   Basic Metabolic Panel: Recent Labs  Lab 05/15/19 1428 05/17/19 1740 05/19/19 1849 05/20/19 0505  NA 140 141 139 140  K 4.0 3.9 3.6 3.7  CL 103 104 102 105  CO2 26 29 28 28   GLUCOSE 196*  154* 129* 95  BUN 11 13 16 16   CREATININE 1.03* 1.42* 1.43* 1.31*  CALCIUM 8.9 8.9 8.6* 8.1*  MG  --   --  1.8  --   PHOS  --   --  2.7  --    GFR: Estimated Creatinine Clearance: 41.6 mL/min (A) (by C-G formula based on SCr of 1.31 mg/dL (H)).   Liver Function Tests: Recent Labs  Lab 05/15/19 1428 05/17/19 1740 05/19/19 1849 05/20/19 0505 05/21/19 0640  AST 43* 52* 51* 41 40  ALT 17 16 17 16 15   ALKPHOS 79 76 81 74 65  BILITOT 2.3* 2.5* 2.1* 1.4* 2.1*  PROT 7.3 6.7 7.1 6.3* 6.6  ALBUMIN 3.1* 3.0* 3.2* 2.7* 2.8*    Recent Labs  Lab 05/17/19 1740 05/19/19 1849 05/21/19 0640  LIPASE 37 46 24   Coagulation Profile: Recent Labs  Lab 05/15/19 1428  INR 1.4*   Urine analysis:    Component Value Date/Time   COLORURINE YELLOW 05/19/2019 2015   APPEARANCEUR HAZY (A) 05/19/2019 2015   LABSPEC 1.013 05/19/2019 2015   PHURINE 5.0 05/19/2019 2015   GLUCOSEU NEGATIVE 05/19/2019 2015   HGBUR NEGATIVE 05/19/2019 2015   [place] NEGATIVE 05/19/2019 2015   KETONESUR NEGATIVE 05/19/2019 2015   PROTEINUR NEGATIVE 05/19/2019 2015   NITRITE NEGATIVE 05/19/2019 2015   LEUKOCYTESUR NEGATIVE 05/19/2019 2015    Recent Results (from the past 240 hour(s))  SARS CORONAVIRUS 2 (TAT 6-24 HRS) Nasopharyngeal Nasopharyngeal Swab     Status: None   Collection Time: 05/15/19  6:48 AM   Specimen: Nasopharyngeal Swab  Result Value Ref Range Status   SARS Coronavirus 2 NEGATIVE NEGATIVE Final    Comment: (NOTE) SARS-CoV-2 target nucleic acids are NOT DETECTED. The SARS-CoV-2 RNA is generally detectable in upper and lower respiratory specimens during the acute phase of infection. Negative results do not preclude SARS-CoV-2 infection, do not rule out co-infections with other pathogens, and should not be used as the sole basis for treatment or other patient management decisions. Negative results must be combined with clinical observations, patient history, and epidemiological information. The expected result is Negative. Fact Sheet for Patients: SugarRoll.be Fact Sheet for Healthcare Providers: https://www.woods-mathews.com/ This test is not yet approved or cleared by the [place] FDA and  has been authorized for detection and/or diagnosis of SARS-CoV-2 by FDA under an Emergency Use Authorization (EUA). This EUA will remain  in effect (meaning this test can be used) for the duration of the COVID-19 declaration under Section 56 4(b)(1) of the Act, 21  U.S.C. section 360bbb-3(b)(1), unless the authorization is terminated or revoked sooner. Performed at [facility], [place] [address] [place], [place] [address]     Radiology Studies: CT ABDOMEN PELVIS W CONTRAST  Result Date: 05/19/2019 CLINICAL DATA:  Abdominal pain EXAM: CT ABDOMEN AND PELVIS WITH CONTRAST TECHNIQUE: Multidetector CT imaging of the abdomen and pelvis was performed using the standard protocol following bolus administration of intravenous contrast. CONTRAST:  26m OMNIPAQUE IOHEXOL 300 MG/ML  SOLN COMPARISON:  None. FINDINGS: Lower chest: Mild bibasilar scarring. Hepatobiliary: Cirrhosis. Gallbladder is unremarkable. Mild central intrahepatic duct dilatation. Common bile duct is mildly dilated but tapers distally. Pancreas: Unremarkable. Spleen: Splenomegaly Adrenals/Urinary Tract: Bilateral nonobstructing renal calculi measuring up to 3 mm. Adrenals are unremarkable. The bladder is obscured by hip arthroplasty. Stomach/Bowel: Stomach is within limits apart from varices. Bowel is normal in caliber. Bowel loops in the pelvis are obscured by artifact. Normal appendix. Vascular/Lymphatic: Prominent gastric varices. Paraumbilical  collateral veins. Small lower paraesophageal varices. Portal vein is patent. Diffuse aortic atherosclerosis. Reproductive: Pelvis is obscured by artifact. Other: No ascites. Musculoskeletal: Postoperative changes L4-L5 and L5-S1. Chronic compression deformity of L1 with cement augmentation right hip arthroplasty with streak artifact IMPRESSION: Cirrhosis with evidence of portal hypertension.  No ascites. Mild central intrahepatic duct dilatation. Common bile duct is mildly dilated but tapers distally. Small nonobstructing renal calculi. Electronically Signed   By: Macy Mis M.D.   On: 05/19/2019 21:01   MR 3D Recon At Scanner  Result Date: 05/21/2019 CLINICAL DATA:  Cirrhosis and portal hypertension. Dilated common bile duct on recent CT scan. EXAM: MRI  ABDOMEN WITHOUT AND WITH CONTRAST (INCLUDING MRCP) TECHNIQUE: Multiplanar multisequence MR imaging of the abdomen was performed both before and after the administration of intravenous contrast. Heavily T2-weighted images of the biliary and pancreatic ducts were obtained, and three-dimensional MRCP images were rendered by post processing. CONTRAST:  67m GADAVIST GADOBUTROL 1 MMOL/ML IV SOLN COMPARISON:  CT 05/19/2019. MRI 07/18/2016 FINDINGS: Markedly motion degraded study. Lower chest: Atelectasis noted in the dependent lung bases. Hepatobiliary: Nodular contour with enlargement of the lateral segment left liver suggests cirrhosis. No evidence for arterial phase hyperintense mint within the liver parenchyma although marked motion artifact could obscure small or subtle lesions. Potential tiny layering gallstones although not a definite finding due to motion artifact. Trace intrahepatic biliary duct prominence evident. Extrahepatic common duct measures 9 mm diameter in the head of the pancreas, mildly increased. No features of choledocholithiasis on the current study. Pancreas:  6 mm cystic focus identified in the tail of pancreas Spleen:  17.3 cm craniocaudal length. No focal abnormality. Adrenals/Urinary Tract: No adrenal nodule or mass. Kidneys unremarkable. Stomach/Bowel: Stomach is unremarkable. No gastric wall thickening. No evidence of outlet obstruction. No small bowel or colonic dilatation within the visualized abdomen. Vascular/Lymphatic: No abdominal aortic aneurysm. The portal vein, superior mesenteric vein, and splenic vein appear patent. Extensive vascular collateralization noted left abdomen with paraesophageal varices. Recanalization of the paraumbilical vein evident. There is no gastrohepatic or hepatoduodenal ligament lymphadenopathy. No retroperitoneal or mesenteric lymphadenopathy. Other:  No intraperitoneal free fluid. Musculoskeletal: Paraumbilical ventral hernia has been incompletely visualized  but was noted on previous CT to contain venous collaterals. No suspicious abnormal marrow enhancement within the visualized bony anatomy. IMPRESSION: 1. Markedly motion degraded study. 2. Morphologic features of the liver compatible with cirrhosis. No evidence for arterial phase hyperenhancement to suggest hepatoma although tiny or subtle lesions could be obscured by the artifact. 3. Evidence of portal venous hypertension with splenomegaly, paraesophageal varices and recanalization of the paraumbilical vein. 4. Mild intra and extrahepatic biliary duct dilatation without findings of choledocholithiasis. 5. 6 mm cystic focus in the tail of the pancreas. Repeat MRI in 2 years recommended. This recommendation follows ACR consensus guidelines: Management of Incidental Pancreatic Cysts: A White Paper of the ACR Incidental Findings Committee. J Am Coll Radiol 25056;97:948-016 6. Paraumbilical midline ventral hernia contains venous collaterals. Electronically Signed   By: EMisty StanleyM.D.   On: 05/21/2019 10:04   DG Chest Port 1 View  Result Date: 05/19/2019 CLINICAL DATA:  Shortness of breath EXAM: PORTABLE CHEST 1 VIEW COMPARISON:  05/17/2019 FINDINGS: Cardiomegaly, vascular congestion. Prior CABG. Lingular atelectasis. No confluent opacities or effusions. No acute bony abnormality. IMPRESSION: Cardiomegaly with vascular congestion. Lingular atelectasis. Electronically Signed   By: KRolm BaptiseM.D.   On: 05/19/2019 21:05   MR ABDOMEN MRCP W WO CONTAST  Result Date: 05/21/2019 CLINICAL DATA:  Cirrhosis and portal hypertension. Dilated common bile duct on recent CT scan. EXAM: MRI ABDOMEN WITHOUT AND WITH CONTRAST (INCLUDING MRCP) TECHNIQUE: Multiplanar multisequence MR imaging of the abdomen was performed both before and after the administration of intravenous contrast. Heavily T2-weighted images of the biliary and pancreatic ducts were obtained, and three-dimensional MRCP images were rendered by post  processing. CONTRAST:  17m GADAVIST GADOBUTROL 1 MMOL/ML IV SOLN COMPARISON:  CT 05/19/2019. MRI 07/18/2016 FINDINGS: Markedly motion degraded study. Lower chest: Atelectasis noted in the dependent lung bases. Hepatobiliary: Nodular contour with enlargement of the lateral segment left liver suggests cirrhosis. No evidence for arterial phase hyperintense mint within the liver parenchyma although marked motion artifact could obscure small or subtle lesions. Potential tiny layering gallstones although not a definite finding due to motion artifact. Trace intrahepatic biliary duct prominence evident. Extrahepatic common duct measures 9 mm diameter in the head of the pancreas, mildly increased. No features of choledocholithiasis on the current study. Pancreas:  6 mm cystic focus identified in the tail of pancreas Spleen:  17.3 cm craniocaudal length. No focal abnormality. Adrenals/Urinary Tract: No adrenal nodule or mass. Kidneys unremarkable. Stomach/Bowel: Stomach is unremarkable. No gastric wall thickening. No evidence of outlet obstruction. No small bowel or colonic dilatation within the visualized abdomen. Vascular/Lymphatic: No abdominal aortic aneurysm. The portal vein, superior mesenteric vein, and splenic vein appear patent. Extensive vascular collateralization noted left abdomen with paraesophageal varices. Recanalization of the paraumbilical vein evident. There is no gastrohepatic or hepatoduodenal ligament lymphadenopathy. No retroperitoneal or mesenteric lymphadenopathy. Other:  No intraperitoneal free fluid. Musculoskeletal: Paraumbilical ventral hernia has been incompletely visualized but was noted on previous CT to contain venous collaterals. No suspicious abnormal marrow enhancement within the visualized bony anatomy. IMPRESSION: 1. Markedly motion degraded study. 2. Morphologic features of the liver compatible with cirrhosis. No evidence for arterial phase hyperenhancement to suggest hepatoma although  tiny or subtle lesions could be obscured by the artifact. 3. Evidence of portal venous hypertension with splenomegaly, paraesophageal varices and recanalization of the paraumbilical vein. 4. Mild intra and extrahepatic biliary duct dilatation without findings of choledocholithiasis. 5. 6 mm cystic focus in the tail of the pancreas. Repeat MRI in 2 years recommended. This recommendation follows ACR consensus guidelines: Management of Incidental Pancreatic Cysts: A White Paper of the ACR Incidental Findings Committee. J Am Coll Radiol 27416;38:453-646 6. Paraumbilical midline ventral hernia contains venous collaterals. Electronically Signed   By: EMisty StanleyM.D.   On: 05/21/2019 10:04   ECHOCARDIOGRAM COMPLETE  Result Date: 05/20/2019    ECHOCARDIOGRAM REPORT   Patient Name:   Samantha KOOPMANDate of Exam: 05/20/2019 Medical Rec #:  [id number]     Height:       63.0 in Accession #:    [id number]    Weight:       190.2 lb Date of Birth:  [birth year]     BSA:          1.893 m Patient Age:    89years       BP:           116/47 mmHg Patient Gender: F              HR:           75 bpm. Exam Location:  [facility]cedure: 2D Echo Indications:    Chest Pain 786.50 / R07.9                 Congestive  Heart Failure 428.0 / I50.9  History:        Patient has prior history of Echocardiogram examinations, most                 recent 11/15/2017. CHF, CAD, Prior CABG, Arrythmias:Atrial                 Fibrillation; Risk Factors:Hypertension, Dyslipidemia and Former                 Smoker.  Sonographer:    Leavy Cella RDCS (AE) Referring Phys: [id number] DAVID MANUEL [place]  1. Left ventricular ejection fraction, by estimation, is 50 to 55%. The left ventricle has low normal function. The left ventricle has no regional wall motion abnormalities. There is mild left ventricular hypertrophy. Left ventricular diastolic parameters are indeterminate.  2. Right ventricular systolic function is low normal. The right  ventricular size is normal. There is normal pulmonary artery systolic pressure. The estimated right ventricular systolic pressure is 40.1 mmHg.  3. Left atrial size was moderately dilated.  4. Right atrial size was mildly dilated.  5. The mitral valve is grossly normal. Mild mitral valve regurgitation.  6. Tricuspid valve regurgitation is mild to moderate.  7. The aortic valve is tricuspid. Aortic valve regurgitation is not visualized.  8. The inferior vena cava is normal in size with greater than 50% respiratory variability, suggesting right atrial pressure of 3 mmHg. FINDINGS  Left Ventricle: Left ventricular ejection fraction, by estimation, is 50 to 55%. The left ventricle has low normal function. The left ventricle has no regional wall motion abnormalities. The left ventricular internal cavity size was normal in size. There is mild left ventricular hypertrophy. Left ventricular diastolic parameters are indeterminate. Right Ventricle: The right ventricular size is normal. No increase in right ventricular wall thickness. Right ventricular systolic function is low normal. There is normal pulmonary artery systolic pressure. The tricuspid regurgitant velocity is 2.46 m/s,  and with an assumed right atrial pressure of 3 mmHg, the estimated right ventricular systolic pressure is 02.7 mmHg. Left Atrium: Left atrial size was moderately dilated. Right Atrium: Right atrial size was mildly dilated. Pericardium: There is no evidence of pericardial effusion. Mitral Valve: The mitral valve is grossly normal. Mild mitral valve regurgitation. Tricuspid Valve: The tricuspid valve is grossly normal. Tricuspid valve regurgitation is mild to moderate. Aortic Valve: The aortic valve is tricuspid. Aortic valve regurgitation is not visualized. Mild aortic valve annular calcification. Pulmonic Valve: The pulmonic valve was grossly normal. Pulmonic valve regurgitation is trivial. Aorta: The aortic root is normal in size and structure.  Venous: The inferior vena cava is normal in size with greater than 50% respiratory variability, suggesting right atrial pressure of 3 mmHg. IAS/Shunts: No atrial level shunt detected by color flow Doppler.  LEFT VENTRICLE PLAX 2D LVIDd:         4.70 cm  Diastology LVIDs:         3.41 cm  LV e' medial:   5.44 cm/s LV PW:         1.20 cm  LV E/e' medial: 25.6 LV IVS:        1.09 cm LVOT diam:     1.90 cm LV SV:         68 LV SV Index:   36 LVOT Area:     2.84 cm  RIGHT VENTRICLE RV S prime:     6.98 cm/s TAPSE (M-mode): 1.9 cm LEFT ATRIUM  Index LA diam:        5.10 cm 2.69 cm/m LA Vol (A2C):   89.7 ml 47.39 ml/m LA Vol (A4C):   73.8 ml 38.99 ml/m LA Biplane Vol: 81.9 ml 43.27 ml/m  AORTIC VALVE LVOT Vmax:   95.00 cm/s LVOT Vmean:  67.400 cm/s LVOT VTI:    0.241 m  AORTA Ao Root diam: 2.40 cm MITRAL VALVE                TRICUSPID VALVE MV Area (PHT): 3.31 cm     TR Peak grad:   24.2 mmHg MV Decel Time: 229 msec     TR Vmax:        246.00 cm/s MV E velocity: 139.00 cm/s MV A velocity: 102.00 cm/s  SHUNTS MV E/A ratio:  1.36         Systemic VTI:  0.24 m                             Systemic Diam: 1.90 cm Rozann Lesches MD Electronically signed by Rozann Lesches MD Signature Date/Time: 05/20/2019/2:36:36 PM    Final     Scheduled Meds: . FLUoxetine  20 mg Oral Daily  . metoprolol succinate  12.5 mg Oral Daily  . pantoprazole  40 mg Oral Daily  . polyethylene glycol  17 g Oral Daily   Continuous Infusions:   LOS: 1 day    Time spent: 30 minutes.    Barton Dubois, MD [facility] Pager [phone]   05/21/2019, 5:22 PM

## 2019-05-22 ENCOUNTER — Inpatient Hospital Stay (HOSPITAL_COMMUNITY): Payer: Medicare Other

## 2019-05-22 DIAGNOSIS — I10 Essential (primary) hypertension: Secondary | ICD-10-CM

## 2019-05-22 DIAGNOSIS — E782 Mixed hyperlipidemia: Secondary | ICD-10-CM

## 2019-05-22 DIAGNOSIS — K7460 Unspecified cirrhosis of liver: Secondary | ICD-10-CM

## 2019-05-22 DIAGNOSIS — I5042 Chronic combined systolic (congestive) and diastolic (congestive) heart failure: Secondary | ICD-10-CM

## 2019-05-22 LAB — HEPATIC FUNCTION PANEL
ALT: 15 U/L (ref 0–44)
AST: 38 U/L (ref 15–41)
Albumin: 2.8 g/dL — ABNORMAL LOW (ref 3.5–5.0)
Alkaline Phosphatase: 63 U/L (ref 38–126)
Bilirubin, Direct: 0.5 mg/dL — ABNORMAL HIGH (ref 0.0–0.2)
Indirect Bilirubin: 1.7 mg/dL — ABNORMAL HIGH (ref 0.3–0.9)
Total Bilirubin: 2.2 mg/dL — ABNORMAL HIGH (ref 0.3–1.2)
Total Protein: 6.5 g/dL (ref 6.5–8.1)

## 2019-05-22 MED ORDER — STERILE WATER FOR INJECTION IJ SOLN
INTRAMUSCULAR | Status: AC
  Administered 2019-05-22: 1.73 mL
  Filled 2019-05-22: qty 10

## 2019-05-22 MED ORDER — SINCALIDE 5 MCG IJ SOLR
INTRAMUSCULAR | Status: AC
  Administered 2019-05-22: 1.73 ug
  Filled 2019-05-22: qty 5

## 2019-05-22 MED ORDER — LIDOCAINE 5 % EX PTCH
1.0000 | MEDICATED_PATCH | CUTANEOUS | Status: DC
  Administered 2019-05-22 – 2019-05-24 (×3): 1 via TRANSDERMAL
  Filled 2019-05-22 (×3): qty 1

## 2019-05-22 MED ORDER — DIPHENHYDRAMINE HCL 25 MG PO CAPS
25.0000 mg | ORAL_CAPSULE | Freq: Once | ORAL | Status: AC
  Administered 2019-05-22: 25 mg via ORAL
  Filled 2019-05-22: qty 1

## 2019-05-22 MED ORDER — TECHNETIUM TC 99M MEBROFENIN IV KIT
5.0000 | PACK | Freq: Once | INTRAVENOUS | Status: AC | PRN
  Administered 2019-05-22: 4.5 via INTRAVENOUS

## 2019-05-22 NOTE — Progress Notes (Signed)
PROGRESS NOTE  ZENIYA LAPIDUS UPJ:[id number] DOB: 11-17-1950 DOA: 05/19/2019 PCP: [name], MD  Brief History:  69 year old female with a history of systolic and diastolic CHF, NASH liver cirrhosis, CKD stage III, hypertension, hyperlipidemia, pancytopenia, paroxysmal atrial fibrillation presenting with 1 to 2-week history of substernal and epigastric abdominal pain.  The patient states that this has been occurring for about 1 to 2 weeks and it is like a tearing sharp sensation.  She denies any frank dysphagia or odynophagia, but did have some episodes of nausea and vomiting.  There is no diarrhea, hematochezia, melena, fevers, chills.  The patient took 4 baby aspirin nitroglycerin with minimal relief.  She states that the pain is sharp and knifelike going directly to her back.  She denies any dysphagia or odynophagia. Patient had low-grade temperature 99.9 F but was hemodynamically stable with oxygen saturation 96% on room air.  CT abdomen and pelvis showed a cirrhotic liver with mild central hepatic ductal dilatation.  CBD was dilated mildly but tapers distally.  CTA chest was negative for dissection with bilateral lung scarring.  MRCP showed mild intrahepatic and extrahepatic duct dilatation without choledocholithiasis.  GI was consulted to assist with management.  ED Course:Initial vital signs temperature 97.5 F, pulse 73, respirations 13, blood pressure 97/47 mmHg and O2 sat 94% on room air. Patient was given 400 mg of ciprofloxacin IVPB, hydromorphone 1 mg IVP, Zofran 4 mg IVP and 2000 mL of NS bolus.  Her urinalysis had a hazy appearance, but was otherwise unremarkable. Initial lactic acid was 2.6 and follow-up was 1.4 mmol/L. White count is 5.6 with a normal differential, hemoglobin 12.6 g/dL and platelets 64. Lipase was normal. CMP has normal electrolytes when calcium is corrected to albumin. Glucose 129, BUN 16 and creatinine 1.43 mg/dL (around patient's recent baseline  creatinine level). Total protein 7.1 and albumin 3.2 g/dL. ALT was 51, ALT 17, alkaline phosphatase 81 and total   Assessment/Plan: Intractable abdominal pain/chest pain -Lipase 24 -CTA chest negative for PE or dissection. -CT abdomen/pelvis negative for acute findings -05/22/2019 HIDA scan negative for obstruction; EF 66% -MRCP negative for choledocholithiasis -Suspect a component of musculoskeletal pain -Appreciate GI consult--> pending possible EGD  NASH Liver Cirrhosis -Continue torsemide and spironolactone  Atypical chest pain -Troponins unremarkable -suspect musculoskeletal pain -Personally reviewed EKG--sinus rhythm, nonspecific T wave changes -Continue Imdur  Persistent atrial fibrillation -EKG shows sinus rhythm -not a candidate for AC due to cirrhosis, esophageal varices, severe thrombocytopenia -continue metoprolol succinate  Essential hypertension -Holding valsartan secondary to soft -Pressure continue metoprolol succinate  Hyperlipidemia -Continue Zetia  Pancytopenia -Secondary to liver cirrhosis  Chronic systolic and diastolic CHF -Appears clinically euvolemic -Continue torsemide -05/20/19 Echo--EF50-55%, no Wma  Acute kidney injury -Baseline creatinine 0.9-1.0 -Serum creatinine peaked 1.43 -Secondary to volume depletion -A.m. BMP  Anxiety/depression -Continue fluoxetine       Disposition Plan: Patient From: Home D/C Place: Home when pain better controlled, etiology clear Barriers: uncontrolled pain  Family Communication:   No Family at bedside  Consultants:  GI  Code Status:  FULL  DVT Prophylaxis:  [place] Heparin / [place] Lovenox   Procedures: As Listed in Progress Note Above  Antibiotics: None  RN Pressure Injury Documentation:        Subjective: Patient continues to complain of epigastric and substernal sharp pain not affected by food.  Her nausea and vomiting has improved.  She denies any shortness of breath, hematemesis,  diarrhea,  hematochezia, melena, dysuria, hematuria.  Objective: Vitals:   05/21/19 2050 05/22/19 0546 05/22/19 0728 05/22/19 0832  BP: (!) 99/42 (!) 114/37  (!) 121/53  Pulse: 72 72  81  Resp: 20 20    Temp: 99 F (37.2 C) 98.8 F (37.1 C)    TempSrc: Oral Oral    SpO2: 93% 94% 96%   Weight:      Height:        Intake/Output Summary (Last 24 hours) at 05/22/2019 1432 Last data filed at 05/22/2019 1257 Gross per 24 hour  Intake 240 ml  Output 500 ml  Net -260 ml   Weight change:  Exam:   General:  Pt is alert, follows commands appropriately, not in acute distress  HEENT: No icterus, No thrush, No neck mass, [place]/AT  Cardiovascular: RRR, S1/S2, no rubs, no gallops  Respiratory: bibasilar crackles, no wheeze  Abdomen: Soft/+BS, non tender, non distended, no guarding  Extremities: trace LE edema, No lymphangitis, No petechiae, No rashes, no synovitis   Data Reviewed: I have personally reviewed following labs and imaging studies Basic Metabolic Panel: Recent Labs  Lab 05/17/19 1740 05/19/19 1849 05/20/19 0505  NA 141 139 140  K 3.9 3.6 3.7  CL 104 102 105  CO2 29 28 28   GLUCOSE 154* 129* 95  BUN 13 16 16   CREATININE 1.42* 1.43* 1.31*  CALCIUM 8.9 8.6* 8.1*  MG  --  1.8  --   PHOS  --  2.7  --    Liver Function Tests: Recent Labs  Lab 05/17/19 1740 05/19/19 1849 05/20/19 0505 05/21/19 0640 05/22/19 0514  AST 52* 51* 41 40 38  ALT 16 17 16 15 15   ALKPHOS 76 81 74 65 63  BILITOT 2.5* 2.1* 1.4* 2.1* 2.2*  PROT 6.7 7.1 6.3* 6.6 6.5  ALBUMIN 3.0* 3.2* 2.7* 2.8* 2.8*   Recent Labs  Lab 05/17/19 1740 05/19/19 1849 05/21/19 0640  LIPASE 37 46 24   No results for input(s): AMMONIA in the last 168 hours. Coagulation Profile: No results for input(s): INR, PROTIME in the last 168 hours. CBC: Recent Labs  Lab 05/17/19 1740 05/19/19 1849 05/20/19 0505 05/21/19 0640  WBC 5.1 5.6 2.6* 3.2*  NEUTROABS 3.6 3.9 1.3* 2.0  HGB 12.1 12.6 10.9* 11.2*  HCT  39.7 40.3 35.8* 36.0  MCV 97.3 94.8 95.7 95.2  PLT 55* 64* 46* 46*   Cardiac Enzymes: No results for input(s): CKTOTAL, CKMB, CKMBINDEX, TROPONINI in the last 168 hours. BNP: Invalid input(s): POCBNP CBG: No results for input(s): GLUCAP in the last 168 hours. HbA1C: No results for input(s): HGBA1C in the last 72 hours. Urine analysis:    Component Value Date/Time   COLORURINE YELLOW 05/19/2019 2015   APPEARANCEUR HAZY (A) 05/19/2019 2015   LABSPEC 1.013 05/19/2019 2015   PHURINE 5.0 05/19/2019 2015   GLUCOSEU NEGATIVE 05/19/2019 2015   HGBUR NEGATIVE 05/19/2019 2015   [place] NEGATIVE 05/19/2019 2015   KETONESUR NEGATIVE 05/19/2019 2015   PROTEINUR NEGATIVE 05/19/2019 2015   NITRITE NEGATIVE 05/19/2019 2015   LEUKOCYTESUR NEGATIVE 05/19/2019 2015   Sepsis Labs: @LABRCNTIP (procalcitonin:4,lacticidven:4) ) Recent Results (from the past 240 hour(s))  SARS CORONAVIRUS 2 (Fred Hammes 6-24 HRS) Nasopharyngeal Nasopharyngeal Swab     Status: None   Collection Time: 05/15/19  6:48 AM   Specimen: Nasopharyngeal Swab  Result Value Ref Range Status   SARS Coronavirus 2 NEGATIVE NEGATIVE Final    Comment: (NOTE) SARS-CoV-2 target nucleic acids are NOT DETECTED. The SARS-CoV-2 RNA is  generally detectable in upper and lower respiratory specimens during the acute phase of infection. Negative results do not preclude SARS-CoV-2 infection, do not rule out co-infections with other pathogens, and should not be used as the sole basis for treatment or other patient management decisions. Negative results must be combined with clinical observations, patient history, and epidemiological information. The expected result is Negative. Fact Sheet for Patients: SugarRoll.be Fact Sheet for Healthcare Providers: https://www.woods-mathews.com/ This test is not yet approved or cleared by the [place] FDA and  has been authorized for detection and/or diagnosis  of SARS-CoV-2 by FDA under an Emergency Use Authorization (EUA). This EUA will remain  in effect (meaning this test can be used) for the duration of the COVID-19 declaration under Section 56 4(b)(1) of the Act, 21 U.S.C. section 360bbb-3(b)(1), unless the authorization is terminated or revoked sooner. Performed at [facility], [place] [address] [place], [place] [address]      Scheduled Meds: . FLUoxetine  20 mg Oral Daily  . metoprolol succinate  12.5 mg Oral Daily  . pantoprazole  40 mg Oral Daily  . polyethylene glycol  17 g Oral Daily  . spironolactone  25 mg Oral Daily  . torsemide  20 mg Oral Daily   Continuous Infusions:  Procedures/Studies: DG Chest 2 View  Result Date: 05/17/2019 CLINICAL DATA:  Chest pain EXAM: CHEST - 2 VIEW COMPARISON:  04/18/2019 FINDINGS: The heart size remains significantly enlarged. The patient is status post prior median sternotomy. There is a stable linear area of scarring/atelectasis in the left mid lung zone. There is vascular congestion without overt pulmonary edema. There is no large pleural effusion. Aortic calcifications are noted. The patient is status post prior vertebral augmentation of the L1 vertebral body. Osteopenia is noted throughout the thoracic spine. There is no acute displaced fracture or dislocation. IMPRESSION: Cardiomegaly with vascular congestion. Electronically Signed   By: Constance Holster M.D.   On: 05/17/2019 17:41   US Abdomen Complete  Result Date: 05/13/2019 CLINICAL DATA:  Cirrhosis. EXAM: ABDOMEN ULTRASOUND COMPLETE COMPARISON:  Abdominal ultrasound dated November 08, 2018. FINDINGS: Gallbladder: Sludge. No gallstones. Mild wall thickening. No sonographic Murphy sign noted by sonographer. Common bile duct: Diameter: 7 mm, at the upper limits of normal. Liver: No focal lesion identified. Unchanged nodular contour with heterogeneously increased parenchymal echogenicity. Portal vein is patent on color Doppler imaging  with normal direction of blood flow towards the liver. IVC: No abnormality visualized. Pancreas: Visualized portion unremarkable. Spleen: Slightly increased moderate splenomegaly with splenic volume of 1411 cc, previously 1315 cc. Right Kidney: Length: 12.1. Echogenicity within normal limits. No mass or hydronephrosis visualized. Left Kidney: Length: 11.5. Echogenicity within normal limits. No mass or hydronephrosis visualized. Abdominal aorta: No aneurysm visualized. Other findings: None. IMPRESSION: 1. Unchanged cirrhosis. 2. Slightly increased moderate splenomegaly. 3. Gallbladder sludge with mild wall thickening, presumably related to underlying liver disease in the absence of acute right upper quadrant pain. Electronically Signed   By: Titus Dubin M.D.   On: 05/13/2019 09:23   CT ABDOMEN PELVIS W CONTRAST  Result Date: 05/19/2019 CLINICAL DATA:  Abdominal pain EXAM: CT ABDOMEN AND PELVIS WITH CONTRAST TECHNIQUE: Multidetector CT imaging of the abdomen and pelvis was performed using the standard protocol following bolus administration of intravenous contrast. CONTRAST:  72m OMNIPAQUE IOHEXOL 300 MG/ML  SOLN COMPARISON:  None. FINDINGS: Lower chest: Mild bibasilar scarring. Hepatobiliary: Cirrhosis. Gallbladder is unremarkable. Mild central intrahepatic duct dilatation. Common bile duct is mildly dilated but tapers  distally. Pancreas: Unremarkable. Spleen: Splenomegaly Adrenals/Urinary Tract: Bilateral nonobstructing renal calculi measuring up to 3 mm. Adrenals are unremarkable. The bladder is obscured by hip arthroplasty. Stomach/Bowel: Stomach is within limits apart from varices. Bowel is normal in caliber. Bowel loops in the pelvis are obscured by artifact. Normal appendix. Vascular/Lymphatic: Prominent gastric varices. Paraumbilical collateral veins. Small lower paraesophageal varices. Portal vein is patent. Diffuse aortic atherosclerosis. Reproductive: Pelvis is obscured by artifact. Other: No  ascites. Musculoskeletal: Postoperative changes L4-L5 and L5-S1. Chronic compression deformity of L1 with cement augmentation right hip arthroplasty with streak artifact IMPRESSION: Cirrhosis with evidence of portal hypertension.  No ascites. Mild central intrahepatic duct dilatation. Common bile duct is mildly dilated but tapers distally. Small nonobstructing renal calculi. Electronically Signed   By: Macy Mis M.D.   On: 05/19/2019 21:01   MR 3D Recon At Scanner  Result Date: 05/21/2019 CLINICAL DATA:  Cirrhosis and portal hypertension. Dilated common bile duct on recent CT scan. EXAM: MRI ABDOMEN WITHOUT AND WITH CONTRAST (INCLUDING MRCP) TECHNIQUE: Multiplanar multisequence MR imaging of the abdomen was performed both before and after the administration of intravenous contrast. Heavily T2-weighted images of the biliary and pancreatic ducts were obtained, and three-dimensional MRCP images were rendered by post processing. CONTRAST:  11m GADAVIST GADOBUTROL 1 MMOL/ML IV SOLN COMPARISON:  CT 05/19/2019. MRI 07/18/2016 FINDINGS: Markedly motion degraded study. Lower chest: Atelectasis noted in the dependent lung bases. Hepatobiliary: Nodular contour with enlargement of the lateral segment left liver suggests cirrhosis. No evidence for arterial phase hyperintense mint within the liver parenchyma although marked motion artifact could obscure small or subtle lesions. Potential tiny layering gallstones although not a definite finding due to motion artifact. Trace intrahepatic biliary duct prominence evident. Extrahepatic common duct measures 9 mm diameter in the head of the pancreas, mildly increased. No features of choledocholithiasis on the current study. Pancreas:  6 mm cystic focus identified in the tail of pancreas Spleen:  17.3 cm craniocaudal length. No focal abnormality. Adrenals/Urinary Tract: No adrenal nodule or mass. Kidneys unremarkable. Stomach/Bowel: Stomach is unremarkable. No gastric wall  thickening. No evidence of outlet obstruction. No small bowel or colonic dilatation within the visualized abdomen. Vascular/Lymphatic: No abdominal aortic aneurysm. The portal vein, superior mesenteric vein, and splenic vein appear patent. Extensive vascular collateralization noted left abdomen with paraesophageal varices. Recanalization of the paraumbilical vein evident. There is no gastrohepatic or hepatoduodenal ligament lymphadenopathy. No retroperitoneal or mesenteric lymphadenopathy. Other:  No intraperitoneal free fluid. Musculoskeletal: Paraumbilical ventral hernia has been incompletely visualized but was noted on previous CT to contain venous collaterals. No suspicious abnormal marrow enhancement within the visualized bony anatomy. IMPRESSION: 1. Markedly motion degraded study. 2. Morphologic features of the liver compatible with cirrhosis. No evidence for arterial phase hyperenhancement to suggest hepatoma although tiny or subtle lesions could be obscured by the artifact. 3. Evidence of portal venous hypertension with splenomegaly, paraesophageal varices and recanalization of the paraumbilical vein. 4. Mild intra and extrahepatic biliary duct dilatation without findings of choledocholithiasis. 5. 6 mm cystic focus in the tail of the pancreas. Repeat MRI in 2 years recommended. This recommendation follows ACR consensus guidelines: Management of Incidental Pancreatic Cysts: A White Paper of the ACR Incidental Findings Committee. J Am Coll Radiol 25456;25:638-937 6. Paraumbilical midline ventral hernia contains venous collaterals. Electronically Signed   By: EMisty StanleyM.D.   On: 05/21/2019 10:04   NM Hepato W/EF  Result Date: 05/22/2019 CLINICAL DATA:  RIGHT upper quadrant pain, nondiagnostic ultrasound, abdominal pain with nausea  and vomiting for few days EXAM: NUCLEAR MEDICINE HEPATOBILIARY IMAGING WITH GALLBLADDER EF TECHNIQUE: Sequential images of the abdomen were obtained out to 60 minutes  following intravenous administration of radiopharmaceutical. After oral ingestion of Ensure, gallbladder ejection fraction was determined. At 60 min, normal ejection fraction is greater than 33%. RADIOPHARMACEUTICALS:  4.5 mCi Tc-28m Choletec IV COMPARISON:  12/29/2010 FINDINGS: Normal tracer extraction from bloodstream indicating normal hepatocellular function. Normal excretion of tracer into biliary tree. Gallbladder visualized at 26 min. Small bowel visualized at 19 min. No hepatic retention of tracer. Subjectively normal emptying of tracer from gallbladder following fatty meal stimulation. Calculated gallbladder ejection fraction is 66%, normal. Normal gallbladder ejection fraction following Ensure ingestion is greater than 33% at 1 hour. IMPRESSION: Patent biliary tree with normal gallbladder ejection fraction of 66% following fatty meal stimulation. Electronically Signed   By: MLavonia DanaM.D.   On: 05/22/2019 12:06   DG Chest Port 1 View  Result Date: 05/19/2019 CLINICAL DATA:  Shortness of breath EXAM: PORTABLE CHEST 1 VIEW COMPARISON:  05/17/2019 FINDINGS: Cardiomegaly, vascular congestion. Prior CABG. Lingular atelectasis. No confluent opacities or effusions. No acute bony abnormality. IMPRESSION: Cardiomegaly with vascular congestion. Lingular atelectasis. Electronically Signed   By: KRolm BaptiseM.D.   On: 05/19/2019 21:05   MR ABDOMEN MRCP W WO CONTAST  Result Date: 05/21/2019 CLINICAL DATA:  Cirrhosis and portal hypertension. Dilated common bile duct on recent CT scan. EXAM: MRI ABDOMEN WITHOUT AND WITH CONTRAST (INCLUDING MRCP) TECHNIQUE: Multiplanar multisequence MR imaging of the abdomen was performed both before and after the administration of intravenous contrast. Heavily T2-weighted images of the biliary and pancreatic ducts were obtained, and three-dimensional MRCP images were rendered by post processing. CONTRAST:  949mGADAVIST GADOBUTROL 1 MMOL/ML IV SOLN COMPARISON:  CT 05/19/2019.  MRI 07/18/2016 FINDINGS: Markedly motion degraded study. Lower chest: Atelectasis noted in the dependent lung bases. Hepatobiliary: Nodular contour with enlargement of the lateral segment left liver suggests cirrhosis. No evidence for arterial phase hyperintense mint within the liver parenchyma although marked motion artifact could obscure small or subtle lesions. Potential tiny layering gallstones although not a definite finding due to motion artifact. Trace intrahepatic biliary duct prominence evident. Extrahepatic common duct measures 9 mm diameter in the head of the pancreas, mildly increased. No features of choledocholithiasis on the current study. Pancreas:  6 mm cystic focus identified in the tail of pancreas Spleen:  17.3 cm craniocaudal length. No focal abnormality. Adrenals/Urinary Tract: No adrenal nodule or mass. Kidneys unremarkable. Stomach/Bowel: Stomach is unremarkable. No gastric wall thickening. No evidence of outlet obstruction. No small bowel or colonic dilatation within the visualized abdomen. Vascular/Lymphatic: No abdominal aortic aneurysm. The portal vein, superior mesenteric vein, and splenic vein appear patent. Extensive vascular collateralization noted left abdomen with paraesophageal varices. Recanalization of the paraumbilical vein evident. There is no gastrohepatic or hepatoduodenal ligament lymphadenopathy. No retroperitoneal or mesenteric lymphadenopathy. Other:  No intraperitoneal free fluid. Musculoskeletal: Paraumbilical ventral hernia has been incompletely visualized but was noted on previous CT to contain venous collaterals. No suspicious abnormal marrow enhancement within the visualized bony anatomy. IMPRESSION: 1. Markedly motion degraded study. 2. Morphologic features of the liver compatible with cirrhosis. No evidence for arterial phase hyperenhancement to suggest hepatoma although tiny or subtle lesions could be obscured by the artifact. 3. Evidence of portal venous  hypertension with splenomegaly, paraesophageal varices and recanalization of the paraumbilical vein. 4. Mild intra and extrahepatic biliary duct dilatation without findings of choledocholithiasis. 5. 6 mm cystic focus  in the tail of the pancreas. Repeat MRI in 2 years recommended. This recommendation follows ACR consensus guidelines: Management of Incidental Pancreatic Cysts: A White Paper of the ACR Incidental Findings Committee. J Am Coll Radiol 6734;19:379-024. 6. Paraumbilical midline ventral hernia contains venous collaterals. Electronically Signed   By: Misty Stanley M.D.   On: 05/21/2019 10:04   CT ANGIO CHEST AORTA W/CM & OR WO/CM  Result Date: 05/17/2019 CLINICAL DATA:  Chest pain radiating to back EXAM: CT ANGIOGRAPHY CHEST WITH CONTRAST TECHNIQUE: Multidetector CT imaging of the chest was performed using the standard protocol during bolus administration of intravenous contrast. Multiplanar CT image reconstructions and MIPs were obtained to evaluate the vascular anatomy. CONTRAST:  67m OMNIPAQUE IOHEXOL 350 MG/ML SOLN COMPARISON:  03/25/2008 FINDINGS: Cardiovascular: Cardiomegaly. Prior CABG. Diffuse aortic atherosclerosis. No aneurysm or dissection. No central pulmonary embolus. Mediastinum/Nodes: No mediastinal, hilar, or axillary adenopathy. Trachea and esophagus are unremarkable. Thyroid unremarkable. Lungs/Pleura: Areas of scarring in the lungs bilaterally. No confluent airspace opacities or effusions. Upper Abdomen: Nodular surface of the liver compatible with cirrhosis. Associated splenomegaly, partially imaged. Upper abdominal and and lower esophageal varices noted. Musculoskeletal: Chest wall soft tissues are unremarkable. No acute bony abnormality. Review of the MIP images confirms the above findings. IMPRESSION: Diffuse aortic atherosclerosis.  No aneurysm or dissection. Cardiomegaly, prior CABG. Areas of scarring in the lungs. No acute cardiopulmonary disease. Changes of cirrhosis with  upper abdominal varices and splenomegaly. Electronically Signed   By: KRolm BaptiseM.D.   On: 05/17/2019 21:26   ECHOCARDIOGRAM COMPLETE  Result Date: 05/20/2019    ECHOCARDIOGRAM REPORT   Patient Name:   GEVELYNN HENCHDate of Exam: 05/20/2019 Medical Rec #:  [id number]     Height:       63.0 in Accession #:    [id number]    Weight:       190.2 lb Date of Birth:  [birth year]     BSA:          1.893 m Patient Age:    89years       BP:           116/47 mmHg Patient Gender: F              HR:           75 bpm. Exam Location:  [facility] NaProcedure: 2D Echo Indications:    Chest Pain 786.50 / R07.9                 Congestive Heart Failure 428.0 / I50.9  History:        Patient has prior history of Echocardiogram examinations, most                 recent 11/15/2017. CHF, CAD, Prior CABG, Arrythmias:Atrial                 Fibrillation; Risk Factors:Hypertension, Dyslipidemia and Former                 Smoker.  Sonographer:    JLeavy CellaRDCS (AE) Referring Phys: [id number] MANUEL OVenedy 1. Left ventricular ejection fraction, by estimation, is 50 to 55%. The left ventricle has low normal function. The left ventricle has no regional wall motion abnormalities. There is mild left ventricular hypertrophy. Left ventricular diastolic parameters are indeterminate.  2. Right ventricular systolic function is low normal. The right ventricular size is normal. There is normal pulmonary artery systolic pressure. The  estimated right ventricular systolic pressure is 01.6 mmHg.  3. Left atrial size was moderately dilated.  4. Right atrial size was mildly dilated.  5. The mitral valve is grossly normal. Mild mitral valve regurgitation.  6. Tricuspid valve regurgitation is mild to moderate.  7. The aortic valve is tricuspid. Aortic valve regurgitation is not visualized.  8. The inferior vena cava is normal in size with greater than 50% respiratory variability, suggesting right atrial pressure of 3 mmHg. FINDINGS   Left Ventricle: Left ventricular ejection fraction, by estimation, is 50 to 55%. The left ventricle has low normal function. The left ventricle has no regional wall motion abnormalities. The left ventricular internal cavity size was normal in size. There is mild left ventricular hypertrophy. Left ventricular diastolic parameters are indeterminate. Right Ventricle: The right ventricular size is normal. No increase in right ventricular wall thickness. Right ventricular systolic function is low normal. There is normal pulmonary artery systolic pressure. The tricuspid regurgitant velocity is 2.46 m/s,  and with an assumed right atrial pressure of 3 mmHg, the estimated right ventricular systolic pressure is 55.3 mmHg. Left Atrium: Left atrial size was moderately dilated. Right Atrium: Right atrial size was mildly dilated. Pericardium: There is no evidence of pericardial effusion. Mitral Valve: The mitral valve is grossly normal. Mild mitral valve regurgitation. Tricuspid Valve: The tricuspid valve is grossly normal. Tricuspid valve regurgitation is mild to moderate. Aortic Valve: The aortic valve is tricuspid. Aortic valve regurgitation is not visualized. Mild aortic valve annular calcification. Pulmonic Valve: The pulmonic valve was grossly normal. Pulmonic valve regurgitation is trivial. Aorta: The aortic root is normal in size and structure. Venous: The inferior vena cava is normal in size with greater than 50% respiratory variability, suggesting right atrial pressure of 3 mmHg. IAS/Shunts: No atrial level shunt detected by color flow Doppler.  LEFT VENTRICLE PLAX 2D LVIDd:         4.70 cm  Diastology LVIDs:         3.41 cm  LV e' medial:   5.44 cm/s LV PW:         1.20 cm  LV E/e' medial: 25.6 LV IVS:        1.09 cm LVOT diam:     1.90 cm LV SV:         68 LV SV Index:   36 LVOT Area:     2.84 cm  RIGHT VENTRICLE RV S prime:     6.98 cm/s TAPSE (M-mode): 1.9 cm LEFT ATRIUM             Index LA diam:        5.10 cm  2.69 cm/m LA Vol (A2C):   89.7 ml 47.39 ml/m LA Vol (A4C):   73.8 ml 38.99 ml/m LA Biplane Vol: 81.9 ml 43.27 ml/m  AORTIC VALVE LVOT Vmax:   95.00 cm/s LVOT Vmean:  67.400 cm/s LVOT VTI:    0.241 m  AORTA Ao Root diam: 2.40 cm MITRAL VALVE                TRICUSPID VALVE MV Area (PHT): 3.31 cm     TR Peak grad:   24.2 mmHg MV Decel Time: 229 msec     TR Vmax:        246.00 cm/s MV E velocity: 139.00 cm/s MV A velocity: 102.00 cm/s  SHUNTS MV E/A ratio:  1.36         Systemic VTI:  0.24 m  Systemic Diam: 1.90 cm Rozann Lesches MD Electronically signed by Rozann Lesches MD Signature Date/Time: 05/20/2019/2:36:36 PM    Final     Orson Eva, DO  [facility]  If 7PM-7AM, please contact night-coverage [email or web address] Password [facility] 05/22/2019, 2:32 PM   LOS: 2 days

## 2019-05-22 NOTE — Progress Notes (Signed)
Dr Laural Golden, called and ordered a Lidoderm patch for the patient.  Noted that Dr. Carles Collet had already ordered Lidoderm patch to start in the morning.  Applied patch tonight per Dr.Rehmans instructions.  Will continue to monitor.

## 2019-05-22 NOTE — Care Management Important Message (Signed)
Important Message  Patient Details  Name: Samantha [name] MRN: [id number] Date of Birth: 1950-06-05   Medicare Important Message Given:  Yes     Tommy Medal 05/22/2019, 1:55 PM

## 2019-05-22 NOTE — Progress Notes (Signed)
HIDA scan with CCK is normal.  Gallbladder visualized fairly rapidly and EF is 66%. No symptom reproduction. Patient begun on heart healthy diet. EGD considered.  Patient not interested at this time.

## 2019-05-22 NOTE — Progress Notes (Signed)
  Subjective:  Patient states she developed severe pain in the right rib cage and epigastrium soon after she ate supper yesterday she had nausea but no vomiting.  She has not had a bowel movement.  This morning she complains of pain in right upper quadrant.  Objective: Blood pressure (!) 114/37, pulse 72, temperature 98.8 F (37.1 C), temperature source Oral, resp. rate 20, height 5' 3"  (1.6 m), weight 86.3 kg, last menstrual period 03/29/2011, SpO2 96 %. Patient is alert and in no acute distress Asterixis absent. Abdomen is full.  Abdomen is soft with mild tenderness below the right costal margin at the midclavicular line.  Spleen is palpable. No pitting edema noted to legs.  Labs/studies Results:  CBC Latest Ref Rng & Units 05/21/2019 05/20/2019 05/19/2019  WBC 4.0 - 10.5 K/uL 3.2(L) 2.6(L) 5.6  Hemoglobin 12.0 - 15.0 g/dL 11.2(L) 10.9(L) 12.6  Hematocrit 36.0 - 46.0 % 36.0 35.8(L) 40.3  Platelets 150 - 400 K/uL 46(L) 46(L) 64(L)    CMP Latest Ref Rng & Units 05/22/2019 05/21/2019 05/20/2019  Glucose 70 - 99 mg/dL - - 95  BUN 8 - 23 mg/dL - - 16  Creatinine 0.44 - 1.00 mg/dL - - 1.31(H)  Sodium 135 - 145 mmol/L - - 140  Potassium 3.5 - 5.1 mmol/L - - 3.7  Chloride 98 - 111 mmol/L - - 105  CO2 22 - 32 mmol/L - - 28  Calcium 8.9 - 10.3 mg/dL - - 8.1(L)  Total Protein 6.5 - 8.1 g/dL 6.5 6.6 6.3(L)  Total Bilirubin 0.3 - 1.2 mg/dL 2.2(H) 2.1(H) 1.4(H)  Alkaline Phos 38 - 126 U/L 63 65 74  AST 15 - 41 U/L 38 40 41  ALT 0 - 44 U/L 15 15 16     Hepatic Function Latest Ref Rng & Units 05/22/2019 05/21/2019 05/20/2019  Total Protein 6.5 - 8.1 g/dL 6.5 6.6 6.3(L)  Albumin 3.5 - 5.0 g/dL 2.8(L) 2.8(L) 2.7(L)  AST 15 - 41 U/L 38 40 41  ALT 0 - 44 U/L 15 15 16   Alk Phosphatase 38 - 126 U/L 63 65 74  Total Bilirubin 0.3 - 1.2 mg/dL 2.2(H) 2.1(H) 1.4(H)  Bilirubin, Direct 0.0 - 0.2 mg/dL 0.5(H) 0.5(H) -      Assessment:  Recurrent right upper quadrant/epigastric pain associated with nausea  and vomiting.  She had vomiting prior to hospitalization.  She had ultrasound few days prior to onset of her symptoms revealing sludge and some wall thickening felt to be due to cirrhosis.  Subsequent CT and MR are not very helpful.  She does not have history of peptic ulcer disease.  Suspect she has a calculus gallbladder disease.  She has underlying cirrhosis and remains to be seen if he would get an adequate study with HIDA.   Plan:  Proceed with HIDA scan with CCK. If HIDA scan is normal will consider esophagogastroduodenoscopy for

## 2019-05-23 ENCOUNTER — Encounter (HOSPITAL_COMMUNITY): Payer: Self-pay | Admitting: Internal Medicine

## 2019-05-23 ENCOUNTER — Inpatient Hospital Stay (HOSPITAL_COMMUNITY): Payer: Medicare Other

## 2019-05-23 DIAGNOSIS — J9601 Acute respiratory failure with hypoxia: Secondary | ICD-10-CM

## 2019-05-23 DIAGNOSIS — F418 Other specified anxiety disorders: Secondary | ICD-10-CM

## 2019-05-23 LAB — CBC
HCT: 37.2 % (ref 36.0–46.0)
Hemoglobin: 11.3 g/dL — ABNORMAL LOW (ref 12.0–15.0)
MCH: 29.4 pg (ref 26.0–34.0)
MCHC: 30.4 g/dL (ref 30.0–36.0)
MCV: 96.9 fL (ref 80.0–100.0)
Platelets: 49 10*3/uL — ABNORMAL LOW (ref 150–400)
RBC: 3.84 MIL/uL — ABNORMAL LOW (ref 3.87–5.11)
RDW: 16.2 % — ABNORMAL HIGH (ref 11.5–15.5)
WBC: 2 10*3/uL — ABNORMAL LOW (ref 4.0–10.5)
nRBC: 0 % (ref 0.0–0.2)

## 2019-05-23 LAB — COMPREHENSIVE METABOLIC PANEL
ALT: 12 U/L (ref 0–44)
AST: 33 U/L (ref 15–41)
Albumin: 2.7 g/dL — ABNORMAL LOW (ref 3.5–5.0)
Alkaline Phosphatase: 61 U/L (ref 38–126)
Anion gap: 9 (ref 5–15)
BUN: 14 mg/dL (ref 8–23)
CO2: 28 mmol/L (ref 22–32)
Calcium: 8.5 mg/dL — ABNORMAL LOW (ref 8.9–10.3)
Chloride: 104 mmol/L (ref 98–111)
Creatinine, Ser: 1.01 mg/dL — ABNORMAL HIGH (ref 0.44–1.00)
GFR calc Af Amer: >60 mL/min (ref >60)
GFR calc non Af Amer: 56 mL/min — ABNORMAL LOW (ref >60)
Glucose, Bld: 89 mg/dL (ref 70–99)
Potassium: 3.5 mmol/L (ref 3.5–5.1)
Sodium: 141 mmol/L (ref 135–145)
Total Bilirubin: 1.9 mg/dL — ABNORMAL HIGH (ref 0.3–1.2)
Total Protein: 6.2 g/dL — ABNORMAL LOW (ref 6.5–8.1)

## 2019-05-23 LAB — BRAIN NATRIURETIC PEPTIDE: B Natriuretic Peptide: 134 pg/mL — ABNORMAL HIGH (ref 0.0–100.0)

## 2019-05-23 MED ORDER — TRAMADOL HCL 50 MG PO TABS
50.0000 mg | ORAL_TABLET | Freq: Two times a day (BID) | ORAL | Status: DC | PRN
  Administered 2019-05-23: 50 mg via ORAL
  Filled 2019-05-23: qty 1

## 2019-05-23 MED ORDER — POTASSIUM CHLORIDE CRYS ER 20 MEQ PO TBCR
20.0000 meq | EXTENDED_RELEASE_TABLET | Freq: Once | ORAL | Status: AC
  Administered 2019-05-23: 13:00:00 20 meq via ORAL
  Filled 2019-05-23: qty 1

## 2019-05-23 MED ORDER — TORSEMIDE 20 MG PO TABS
20.0000 mg | ORAL_TABLET | Freq: Once | ORAL | Status: AC
  Administered 2019-05-23: 20 mg via ORAL
  Filled 2019-05-23: qty 1

## 2019-05-23 MED ORDER — TORSEMIDE 20 MG PO TABS
40.0000 mg | ORAL_TABLET | Freq: Every day | ORAL | Status: DC
  Administered 2019-05-24: 40 mg via ORAL
  Filled 2019-05-23: qty 2

## 2019-05-23 MED ORDER — IOHEXOL 350 MG/ML SOLN
100.0000 mL | Freq: Once | INTRAVENOUS | Status: AC | PRN
  Administered 2019-05-23: 100 mL via INTRAVENOUS

## 2019-05-23 MED ORDER — FUROSEMIDE 10 MG/ML IJ SOLN
40.0000 mg | Freq: Once | INTRAMUSCULAR | Status: AC
  Administered 2019-05-23: 40 mg via INTRAVENOUS
  Filled 2019-05-23: qty 4

## 2019-05-23 MED ORDER — FENTANYL CITRATE (PF) 100 MCG/2ML IJ SOLN: 25.0000 ug | Freq: Once | INTRAMUSCULAR | Status: DC

## 2019-05-23 NOTE — Progress Notes (Signed)
PROGRESS NOTE  Samantha [name]:[id number] DOB: [birth year] DOA: 05/19/2019 PCP: [name], MD  Brief History:  69 year old female with a history of systolic and diastolic CHF, NASH liver cirrhosis, CKD stage III, hypertension, hyperlipidemia, pancytopenia, paroxysmal atrial fibrillation presenting with 1 to 2-week history of substernal and epigastric abdominal pain.  The patient states that this has been occurring for about 1 to 2 weeks and it is like a tearing sharp sensation.  She denies any frank dysphagia or odynophagia, but did have some episodes of nausea and vomiting.  There is no diarrhea, hematochezia, melena, fevers, chills.  The patient took 4 baby aspirin nitroglycerin with minimal relief.  She states that the pain is sharp and knifelike going directly to her back.  She denies any dysphagia or odynophagia. Patient had low-grade temperature 99.9 F but was hemodynamically stable with oxygen saturation 96% on room air.  CT abdomen and pelvis showed a cirrhotic liver with mild central hepatic ductal dilatation.  CBD was dilated mildly but tapers distally.  CTA chest was negative for dissection with bilateral lung scarring.  MRCP showed mild intrahepatic and extrahepatic duct dilatation without choledocholithiasis.  GI was consulted to assist with management.  ED Course:Initial vital signs temperature 97.5 F, pulse 73, respirations 13, blood pressure 97/47 mmHg and O2 sat 94% on room air. Patient was given 400 mg of ciprofloxacin IVPB, hydromorphone 1 mg IVP, Zofran 4 mg IVP and 2000 mL of NS bolus.  Her urinalysis had a hazy appearance, but was otherwise unremarkable. Initial lactic acid was 2.6 and follow-up was 1.4 mmol/L. White count is 5.6 with a normal differential, hemoglobin 12.6 g/dL and platelets 64. Lipase was normal. CMP has normal electrolytes when calcium is corrected to albumin. Glucose 129, BUN 16 and creatinine 1.43 mg/dL (around patient's recent  baseline creatinine level). Total protein 7.1 and albumin 3.2 g/dL. ALT was 51, ALT 17, alkaline phosphatase 81 and total   Assessment/Plan: Intractable abdominal pain/chest pain -Lipase 24 -troponins negative -EKG--sinus, non specific T wave changes -CTA chest negative for PE or dissection. -CT abdomen/pelvis negative for acute findings -05/22/2019 HIDA scan negative for obstruction; EF 66% -MRCP negative for choledocholithiasis -Suspect a component of musculoskeletal pain -Appreciate GI consult--> feels more like its musculosk pain  Acute Respiratory Failure with hypoxia -new oxygen requirement -suspect pt has been chronically hypoxic at home in setting of COPD with ~40 pack year hx tobacco -desaturated to 85% with ambulation -repeat CXR -check BNP  NASH Liver Cirrhosis -Continue torsemide and spironolactone  Atypical chest pain -Troponins unremarkable -suspect musculoskeletal pain -Personally reviewed EKG--sinus rhythm, nonspecific T wave changes -Continue Imdur  Persistent atrial fibrillation -EKG shows sinus rhythm -not a candidate for AC due to cirrhosis, esophageal varices, severe thrombocytopenia -continue metoprolol succinate  Essential hypertension -Holding valsartan secondary to soft -Pressure continue metoprolol succinate  Hyperlipidemia -Continue Zetia  Pancytopenia -Secondary to liver cirrhosis  Chronic systolic and diastolic CHF -Appears clinically euvolemic -Continue torsemide -05/20/19 Echo--EF50-55%, no Wma  Acute kidney injury -Baseline creatinine 0.9-1.0 -Serum creatinine peaked 1.43 -Secondary to volume depletion -A.m. BMP  Anxiety/depression -Continue fluoxetine       Disposition Plan: Patient From: Home D/C Place: Home when pain better controlled, etiology clear Barriers: uncontrolled pain, new oxygen requirement--work up in proress  Family Communication:   Sister and niece updated at bedside  3/25  Consultants:  GI  Code Status:  FULL  DVT Prophylaxis:  SCDs   Procedures:  As Listed in Progress Note Above  Antibiotics: None    Total time spent 35 minutes.  Greater than 50% spent face to face counseling and coordinating care.    Subjective: Patient denies fevers, chills, headache,  dyspnea, nausea, vomiting, diarrhea, abdominal pain, dysuria, hematuria, hematochezia, and melena.  She complains of intermittent chest pain, worse with movement.    Objective: Vitals:   05/22/19 1943 05/22/19 1953 05/23/19 0501 05/23/19 0836  BP:  (!) 103/37 (!) 100/36 (!) 114/34  Pulse:  82 64 70  Resp:  20 16   Temp:  98.7 F (37.1 C) 98.7 F (37.1 C)   TempSrc:  Oral Oral   SpO2: (!) 86% 91% 91%   Weight:      Height:        Intake/Output Summary (Last 24 hours) at 05/23/2019 1304 Last data filed at 05/23/2019 0800 Gross per 24 hour  Intake 480 ml  Output 1250 ml  Net -770 ml   Weight change:  Exam:   General:  Pt is alert, follows commands appropriately, not in acute distress  HEENT: No icterus, No thrush, No neck mass, [place]/AT  Cardiovascular: RRR, S1/S2, no rubs, no gallops  Respiratory: fine bibasilar crackles. No wheeze  Abdomen: Soft/+BS, non tender, non distended, no guarding  Extremities: trace LE edema, No lymphangitis, No petechiae, No rashes, no synovitis   Data Reviewed: I have personally reviewed following labs and imaging studies Basic Metabolic Panel: Recent Labs  Lab 05/17/19 1740 05/19/19 1849 05/20/19 0505 05/23/19 0550  NA 141 139 140 141  K 3.9 3.6 3.7 3.5  CL 104 102 105 104  CO2 29 28 28 28   GLUCOSE 154* 129* 95 89  BUN 13 16 16 14   CREATININE 1.42* 1.43* 1.31* 1.01*  CALCIUM 8.9 8.6* 8.1* 8.5*  MG  --  1.8  --   --   PHOS  --  2.7  --   --    Liver Function Tests: Recent Labs  Lab 05/19/19 1849 05/20/19 0505 05/21/19 0640 05/22/19 0514 05/23/19 0550  AST 51* 41 40 38 33  ALT 17 16 15 15 12   ALKPHOS 81 74 65  63 61  BILITOT 2.1* 1.4* 2.1* 2.2* 1.9*  PROT 7.1 6.3* 6.6 6.5 6.2*  ALBUMIN 3.2* 2.7* 2.8* 2.8* 2.7*   Recent Labs  Lab 05/17/19 1740 05/19/19 1849 05/21/19 0640  LIPASE 37 46 24   No results for input(s): AMMONIA in the last 168 hours. Coagulation Profile: No results for input(s): INR, PROTIME in the last 168 hours. CBC: Recent Labs  Lab 05/17/19 1740 05/19/19 1849 05/20/19 0505 05/21/19 0640 05/23/19 0550  WBC 5.1 5.6 2.6* 3.2* 2.0*  NEUTROABS 3.6 3.9 1.3* 2.0  --   HGB 12.1 12.6 10.9* 11.2* 11.3*  HCT 39.7 40.3 35.8* 36.0 37.2  MCV 97.3 94.8 95.7 95.2 96.9  PLT 55* 64* 46* 46* 49*   Cardiac Enzymes: No results for input(s): CKTOTAL, CKMB, CKMBINDEX, TROPONINI in the last 168 hours. BNP: Invalid input(s): POCBNP CBG: No results for input(s): GLUCAP in the last 168 hours. HbA1C: No results for input(s): HGBA1C in the last 72 hours. Urine analysis:    Component Value Date/Time   COLORURINE YELLOW 05/19/2019 2015   APPEARANCEUR HAZY (A) 05/19/2019 2015   LABSPEC 1.013 05/19/2019 2015   PHURINE 5.0 05/19/2019 2015   GLUCOSEU NEGATIVE 05/19/2019 2015   HGBUR NEGATIVE 05/19/2019 2015   [place] NEGATIVE 05/19/2019 2015   KETONESUR NEGATIVE 05/19/2019 2015   PROTEINUR NEGATIVE  05/19/2019 2015   NITRITE NEGATIVE 05/19/2019 2015   LEUKOCYTESUR NEGATIVE 05/19/2019 2015   Sepsis Labs: @LABRCNTIP (procalcitonin:4,lacticidven:4) ) Recent Results (from the past 240 hour(s))  SARS CORONAVIRUS 2 (Sui Kasparek 6-24 HRS) Nasopharyngeal Nasopharyngeal Swab     Status: None   Collection Time: 05/15/19  6:48 AM   Specimen: Nasopharyngeal Swab  Result Value Ref Range Status   SARS Coronavirus 2 NEGATIVE NEGATIVE Final    Comment: (NOTE) SARS-CoV-2 target nucleic acids are NOT DETECTED. The SARS-CoV-2 RNA is generally detectable in upper and lower respiratory specimens during the acute phase of infection. Negative results do not preclude SARS-CoV-2 infection, do not rule  out co-infections with other pathogens, and should not be used as the sole basis for treatment or other patient management decisions. Negative results must be combined with clinical observations, patient history, and epidemiological information. The expected result is Negative. Fact Sheet for Patients: SugarRoll.be Fact Sheet for Healthcare Providers: https://www.woods-mathews.com/ This test is not yet approved or cleared by the [place] FDA and  has been authorized for detection and/or diagnosis of SARS-CoV-2 by FDA under an Emergency Use Authorization (EUA). This EUA will remain  in effect (meaning this test can be used) for the duration of the COVID-19 declaration under Section 56 4(b)(1) of the Act, 21 U.S.C. section 360bbb-3(b)(1), unless the authorization is terminated or revoked sooner. Performed at [facility], [place] [address], [place], [place] [address]      Scheduled Meds: . FLUoxetine  20 mg Oral Daily  . lidocaine  1 patch Transdermal Q24H  . metoprolol succinate  12.5 mg Oral Daily  . pantoprazole  40 mg Oral Daily  . polyethylene glycol  17 g Oral Daily  . spironolactone  25 mg Oral Daily  . torsemide  20 mg Oral Once  . [START ON 05/24/2019] torsemide  40 mg Oral Daily   Continuous Infusions:  Procedures/Studies: DG Chest 2 View  Result Date: 05/17/2019 CLINICAL DATA:  Chest pain EXAM: CHEST - 2 VIEW COMPARISON:  04/18/2019 FINDINGS: The heart size remains significantly enlarged. The patient is status post prior median sternotomy. There is a stable linear area of scarring/atelectasis in the left mid lung zone. There is vascular congestion without overt pulmonary edema. There is no large pleural effusion. Aortic calcifications are noted. The patient is status post prior vertebral augmentation of the L1 vertebral body. Osteopenia is noted throughout the thoracic spine. There is no acute displaced fracture or  dislocation. IMPRESSION: Cardiomegaly with vascular congestion. Electronically Signed   By: Constance Holster M.D.   On: 05/17/2019 17:41   US Abdomen Complete  Result Date: 05/13/2019 CLINICAL DATA:  Cirrhosis. EXAM: ABDOMEN ULTRASOUND COMPLETE COMPARISON:  Abdominal ultrasound dated November 08, 2018. FINDINGS: Gallbladder: Sludge. No gallstones. Mild wall thickening. No sonographic Murphy sign noted by sonographer. Common bile duct: Diameter: 7 mm, at the upper limits of normal. Liver: No focal lesion identified. Unchanged nodular contour with heterogeneously increased parenchymal echogenicity. Portal vein is patent on color Doppler imaging with normal direction of blood flow towards the liver. IVC: No abnormality visualized. Pancreas: Visualized portion unremarkable. Spleen: Slightly increased moderate splenomegaly with splenic volume of 1411 cc, previously 1315 cc. Right Kidney: Length: 12.1. Echogenicity within normal limits. No mass or hydronephrosis visualized. Left Kidney: Length: 11.5. Echogenicity within normal limits. No mass or hydronephrosis visualized. Abdominal aorta: No aneurysm visualized. Other findings: None. IMPRESSION: 1. Unchanged cirrhosis. 2. Slightly increased moderate splenomegaly. 3. Gallbladder sludge with mild wall thickening, presumably related to underlying  liver disease in the absence of acute right upper quadrant pain. Electronically Signed   By: Titus Dubin M.D.   On: 05/13/2019 09:23   CT ABDOMEN PELVIS W CONTRAST  Result Date: 05/19/2019 CLINICAL DATA:  Abdominal pain EXAM: CT ABDOMEN AND PELVIS WITH CONTRAST TECHNIQUE: Multidetector CT imaging of the abdomen and pelvis was performed using the standard protocol following bolus administration of intravenous contrast. CONTRAST:  62m OMNIPAQUE IOHEXOL 300 MG/ML  SOLN COMPARISON:  None. FINDINGS: Lower chest: Mild bibasilar scarring. Hepatobiliary: Cirrhosis. Gallbladder is unremarkable. Mild central intrahepatic duct  dilatation. Common bile duct is mildly dilated but tapers distally. Pancreas: Unremarkable. Spleen: Splenomegaly Adrenals/Urinary Tract: Bilateral nonobstructing renal calculi measuring up to 3 mm. Adrenals are unremarkable. The bladder is obscured by hip arthroplasty. Stomach/Bowel: Stomach is within limits apart from varices. Bowel is normal in caliber. Bowel loops in the pelvis are obscured by artifact. Normal appendix. Vascular/Lymphatic: Prominent gastric varices. Paraumbilical collateral veins. Small lower paraesophageal varices. Portal vein is patent. Diffuse aortic atherosclerosis. Reproductive: Pelvis is obscured by artifact. Other: No ascites. Musculoskeletal: Postoperative changes L4-L5 and L5-S1. Chronic compression deformity of L1 with cement augmentation right hip arthroplasty with streak artifact IMPRESSION: Cirrhosis with evidence of portal hypertension.  No ascites. Mild central intrahepatic duct dilatation. Common bile duct is mildly dilated but tapers distally. Small nonobstructing renal calculi. Electronically Signed   By: PMacy MisM.D.   On: 05/19/2019 21:01   MR 3D Recon At Scanner  Result Date: 05/21/2019 CLINICAL DATA:  Cirrhosis and portal hypertension. Dilated common bile duct on recent CT scan. EXAM: MRI ABDOMEN WITHOUT AND WITH CONTRAST (INCLUDING MRCP) TECHNIQUE: Multiplanar multisequence MR imaging of the abdomen was performed both before and after the administration of intravenous contrast. Heavily T2-weighted images of the biliary and pancreatic ducts were obtained, and three-dimensional MRCP images were rendered by post processing. CONTRAST:  927mGADAVIST GADOBUTROL 1 MMOL/ML IV SOLN COMPARISON:  CT 05/19/2019. MRI 07/18/2016 FINDINGS: Markedly motion degraded study. Lower chest: Atelectasis noted in the dependent lung bases. Hepatobiliary: Nodular contour with enlargement of the lateral segment left liver suggests cirrhosis. No evidence for arterial phase hyperintense mint  within the liver parenchyma although marked motion artifact could obscure small or subtle lesions. Potential tiny layering gallstones although not a definite finding due to motion artifact. Trace intrahepatic biliary duct prominence evident. Extrahepatic common duct measures 9 mm diameter in the head of the pancreas, mildly increased. No features of choledocholithiasis on the current study. Pancreas:  6 mm cystic focus identified in the tail of pancreas Spleen:  17.3 cm craniocaudal length. No focal abnormality. Adrenals/Urinary Tract: No adrenal nodule or mass. Kidneys unremarkable. Stomach/Bowel: Stomach is unremarkable. No gastric wall thickening. No evidence of outlet obstruction. No small bowel or colonic dilatation within the visualized abdomen. Vascular/Lymphatic: No abdominal aortic aneurysm. The portal vein, superior mesenteric vein, and splenic vein appear patent. Extensive vascular collateralization noted left abdomen with paraesophageal varices. Recanalization of the paraumbilical vein evident. There is no gastrohepatic or hepatoduodenal ligament lymphadenopathy. No retroperitoneal or mesenteric lymphadenopathy. Other:  No intraperitoneal free fluid. Musculoskeletal: Paraumbilical ventral hernia has been incompletely visualized but was noted on previous CT to contain venous collaterals. No suspicious abnormal marrow enhancement within the visualized bony anatomy. IMPRESSION: 1. Markedly motion degraded study. 2. Morphologic features of the liver compatible with cirrhosis. No evidence for arterial phase hyperenhancement to suggest hepatoma although tiny or subtle lesions could be obscured by the artifact. 3. Evidence of portal venous hypertension with splenomegaly, paraesophageal  varices and recanalization of the paraumbilical vein. 4. Mild intra and extrahepatic biliary duct dilatation without findings of choledocholithiasis. 5. 6 mm cystic focus in the tail of the pancreas. Repeat MRI in 2 years  recommended. This recommendation follows ACR consensus guidelines: Management of Incidental Pancreatic Cysts: A White Paper of the ACR Incidental Findings Committee. J Am Coll Radiol 2409;73:532-992. 6. Paraumbilical midline ventral hernia contains venous collaterals. Electronically Signed   By: Misty Stanley M.D.   On: 05/21/2019 10:04   NM Hepato W/EF  Result Date: 05/22/2019 CLINICAL DATA:  RIGHT upper quadrant pain, nondiagnostic ultrasound, abdominal pain with nausea and vomiting for few days EXAM: NUCLEAR MEDICINE HEPATOBILIARY IMAGING WITH GALLBLADDER EF TECHNIQUE: Sequential images of the abdomen were obtained out to 60 minutes following intravenous administration of radiopharmaceutical. After oral ingestion of Ensure, gallbladder ejection fraction was determined. At 60 min, normal ejection fraction is greater than 33%. RADIOPHARMACEUTICALS:  4.5 mCi Tc-76m Choletec IV COMPARISON:  12/29/2010 FINDINGS: Normal tracer extraction from bloodstream indicating normal hepatocellular function. Normal excretion of tracer into biliary tree. Gallbladder visualized at 26 min. Small bowel visualized at 19 min. No hepatic retention of tracer. Subjectively normal emptying of tracer from gallbladder following fatty meal stimulation. Calculated gallbladder ejection fraction is 66%, normal. Normal gallbladder ejection fraction following Ensure ingestion is greater than 33% at 1 hour. IMPRESSION: Patent biliary tree with normal gallbladder ejection fraction of 66% following fatty meal stimulation. Electronically Signed   By: MLavonia DanaM.D.   On: 05/22/2019 12:06   DG Chest Port 1 View  Result Date: 05/19/2019 CLINICAL DATA:  Shortness of breath EXAM: PORTABLE CHEST 1 VIEW COMPARISON:  05/17/2019 FINDINGS: Cardiomegaly, vascular congestion. Prior CABG. Lingular atelectasis. No confluent opacities or effusions. No acute bony abnormality. IMPRESSION: Cardiomegaly with vascular congestion. Lingular atelectasis.  Electronically Signed   By: KRolm BaptiseM.D.   On: 05/19/2019 21:05   MR ABDOMEN MRCP W WO CONTAST  Result Date: 05/21/2019 CLINICAL DATA:  Cirrhosis and portal hypertension. Dilated common bile duct on recent CT scan. EXAM: MRI ABDOMEN WITHOUT AND WITH CONTRAST (INCLUDING MRCP) TECHNIQUE: Multiplanar multisequence MR imaging of the abdomen was performed both before and after the administration of intravenous contrast. Heavily T2-weighted images of the biliary and pancreatic ducts were obtained, and three-dimensional MRCP images were rendered by post processing. CONTRAST:  945mGADAVIST GADOBUTROL 1 MMOL/ML IV SOLN COMPARISON:  CT 05/19/2019. MRI 07/18/2016 FINDINGS: Markedly motion degraded study. Lower chest: Atelectasis noted in the dependent lung bases. Hepatobiliary: Nodular contour with enlargement of the lateral segment left liver suggests cirrhosis. No evidence for arterial phase hyperintense mint within the liver parenchyma although marked motion artifact could obscure small or subtle lesions. Potential tiny layering gallstones although not a definite finding due to motion artifact. Trace intrahepatic biliary duct prominence evident. Extrahepatic common duct measures 9 mm diameter in the head of the pancreas, mildly increased. No features of choledocholithiasis on the current study. Pancreas:  6 mm cystic focus identified in the tail of pancreas Spleen:  17.3 cm craniocaudal length. No focal abnormality. Adrenals/Urinary Tract: No adrenal nodule or mass. Kidneys unremarkable. Stomach/Bowel: Stomach is unremarkable. No gastric wall thickening. No evidence of outlet obstruction. No small bowel or colonic dilatation within the visualized abdomen. Vascular/Lymphatic: No abdominal aortic aneurysm. The portal vein, superior mesenteric vein, and splenic vein appear patent. Extensive vascular collateralization noted left abdomen with paraesophageal varices. Recanalization of the paraumbilical vein evident.  There is no gastrohepatic or hepatoduodenal ligament lymphadenopathy. No retroperitoneal  or mesenteric lymphadenopathy. Other:  No intraperitoneal free fluid. Musculoskeletal: Paraumbilical ventral hernia has been incompletely visualized but was noted on previous CT to contain venous collaterals. No suspicious abnormal marrow enhancement within the visualized bony anatomy. IMPRESSION: 1. Markedly motion degraded study. 2. Morphologic features of the liver compatible with cirrhosis. No evidence for arterial phase hyperenhancement to suggest hepatoma although tiny or subtle lesions could be obscured by the artifact. 3. Evidence of portal venous hypertension with splenomegaly, paraesophageal varices and recanalization of the paraumbilical vein. 4. Mild intra and extrahepatic biliary duct dilatation without findings of choledocholithiasis. 5. 6 mm cystic focus in the tail of the pancreas. Repeat MRI in 2 years recommended. This recommendation follows ACR consensus guidelines: Management of Incidental Pancreatic Cysts: A White Paper of the ACR Incidental Findings Committee. J Am Coll Radiol 6283;15:176-160. 6. Paraumbilical midline ventral hernia contains venous collaterals. Electronically Signed   By: Misty Stanley M.D.   On: 05/21/2019 10:04   CT ANGIO CHEST AORTA W/CM & OR WO/CM  Result Date: 05/17/2019 CLINICAL DATA:  Chest pain radiating to back EXAM: CT ANGIOGRAPHY CHEST WITH CONTRAST TECHNIQUE: Multidetector CT imaging of the chest was performed using the standard protocol during bolus administration of intravenous contrast. Multiplanar CT image reconstructions and MIPs were obtained to evaluate the vascular anatomy. CONTRAST:  68m OMNIPAQUE IOHEXOL 350 MG/ML SOLN COMPARISON:  03/25/2008 FINDINGS: Cardiovascular: Cardiomegaly. Prior CABG. Diffuse aortic atherosclerosis. No aneurysm or dissection. No central pulmonary embolus. Mediastinum/Nodes: No mediastinal, hilar, or axillary adenopathy. Trachea and  esophagus are unremarkable. Thyroid unremarkable. Lungs/Pleura: Areas of scarring in the lungs bilaterally. No confluent airspace opacities or effusions. Upper Abdomen: Nodular surface of the liver compatible with cirrhosis. Associated splenomegaly, partially imaged. Upper abdominal and and lower esophageal varices noted. Musculoskeletal: Chest wall soft tissues are unremarkable. No acute bony abnormality. Review of the MIP images confirms the above findings. IMPRESSION: Diffuse aortic atherosclerosis.  No aneurysm or dissection. Cardiomegaly, prior CABG. Areas of scarring in the lungs. No acute cardiopulmonary disease. Changes of cirrhosis with upper abdominal varices and splenomegaly. Electronically Signed   By: KRolm BaptiseM.D.   On: 05/17/2019 21:26   ECHOCARDIOGRAM COMPLETE  Result Date: 05/20/2019    ECHOCARDIOGRAM REPORT   Patient Name:   Samantha GULDENDate of Exam: 05/20/2019 Medical Rec #:  [id number]     Height:       63.0 in Accession #:    [id number]    Weight:       190.2 lb Date of Birth:  [birth year]     BSA:          1.893 m Patient Age:    89years       BP:           116/47 mmHg Patient Gender: F              HR:           75 bpm. Exam Location:  [facility] NaProcedure: 2D Echo Indications:    Chest Pain 786.50 / R07.9                 Congestive Heart Failure 428.0 / I50.9  History:        Patient has prior history of Echocardiogram examinations, most                 recent 11/15/2017. CHF, CAD, Prior CABG, Arrythmias:Atrial  Fibrillation; Risk Factors:Hypertension, Dyslipidemia and Former                 Smoker.  Sonographer:    Leavy Cella RDCS (AE) Referring Phys: [id number] Darion Juhasz MANUEL [place]  1. Left ventricular ejection fraction, by estimation, is 50 to 55%. The left ventricle has low normal function. The left ventricle has no regional wall motion abnormalities. There is mild left ventricular hypertrophy. Left ventricular diastolic parameters are indeterminate.   2. Right ventricular systolic function is low normal. The right ventricular size is normal. There is normal pulmonary artery systolic pressure. The estimated right ventricular systolic pressure is 41.5 mmHg.  3. Left atrial size was moderately dilated.  4. Right atrial size was mildly dilated.  5. The mitral valve is grossly normal. Mild mitral valve regurgitation.  6. Tricuspid valve regurgitation is mild to moderate.  7. The aortic valve is tricuspid. Aortic valve regurgitation is not visualized.  8. The inferior vena cava is normal in size with greater than 50% respiratory variability, suggesting right atrial pressure of 3 mmHg. FINDINGS  Left Ventricle: Left ventricular ejection fraction, by estimation, is 50 to 55%. The left ventricle has low normal function. The left ventricle has no regional wall motion abnormalities. The left ventricular internal cavity size was normal in size. There is mild left ventricular hypertrophy. Left ventricular diastolic parameters are indeterminate. Right Ventricle: The right ventricular size is normal. No increase in right ventricular wall thickness. Right ventricular systolic function is low normal. There is normal pulmonary artery systolic pressure. The tricuspid regurgitant velocity is 2.46 m/s,  and with an assumed right atrial pressure of 3 mmHg, the estimated right ventricular systolic pressure is 83.0 mmHg. Left Atrium: Left atrial size was moderately dilated. Right Atrium: Right atrial size was mildly dilated. Pericardium: There is no evidence of pericardial effusion. Mitral Valve: The mitral valve is grossly normal. Mild mitral valve regurgitation. Tricuspid Valve: The tricuspid valve is grossly normal. Tricuspid valve regurgitation is mild to moderate. Aortic Valve: The aortic valve is tricuspid. Aortic valve regurgitation is not visualized. Mild aortic valve annular calcification. Pulmonic Valve: The pulmonic valve was grossly normal. Pulmonic valve regurgitation is  trivial. Aorta: The aortic root is normal in size and structure. Venous: The inferior vena cava is normal in size with greater than 50% respiratory variability, suggesting right atrial pressure of 3 mmHg. IAS/Shunts: No atrial level shunt detected by color flow Doppler.  LEFT VENTRICLE PLAX 2D LVIDd:         4.70 cm  Diastology LVIDs:         3.41 cm  LV e' medial:   5.44 cm/s LV PW:         1.20 cm  LV E/e' medial: 25.6 LV IVS:        1.09 cm LVOT diam:     1.90 cm LV SV:         68 LV SV Index:   36 LVOT Area:     2.84 cm  RIGHT VENTRICLE RV S prime:     6.98 cm/s TAPSE (M-mode): 1.9 cm LEFT ATRIUM             Index LA diam:        5.10 cm 2.69 cm/m LA Vol (A2C):   89.7 ml 47.39 ml/m LA Vol (A4C):   73.8 ml 38.99 ml/m LA Biplane Vol: 81.9 ml 43.27 ml/m  AORTIC VALVE LVOT Vmax:   95.00 cm/s LVOT Vmean:  67.400 cm/s LVOT VTI:  0.241 m  AORTA Ao Root diam: 2.40 cm MITRAL VALVE                TRICUSPID VALVE MV Area (PHT): 3.31 cm     TR Peak grad:   24.2 mmHg MV Decel Time: 229 msec     TR Vmax:        246.00 cm/s MV E velocity: 139.00 cm/s MV A velocity: 102.00 cm/s  SHUNTS MV E/A ratio:  1.36         Systemic VTI:  0.24 m                             Systemic Diam: 1.90 cm Rozann Lesches MD Electronically signed by Rozann Lesches MD Signature Date/Time: 05/20/2019/2:36:36 PM    Final     Orson Eva, DO  [facility]  If 7PM-7AM, please contact night-coverage [email or web address] Password [email or web address] 05/23/2019, 1:04 PM   LOS: 3 days

## 2019-05-23 NOTE — Progress Notes (Signed)
  Subjective:  Patient continues to experience postprandial epigastric pain which she describes as severe.  She reports no relief with Lidoderm patch.  She is experiencing nausea but no vomiting.  She denies melena or rectal bleeding.  Objective: Blood pressure (!) 103/43, pulse 77, temperature 98.2 F (36.8 C), temperature source Oral, resp. rate 16, height _0  (1.6 m), weight 86.3 kg, last menstrual period 03/29/2011, SpO2 92 %. Patient is alert and appears to be in no acute distress. She does not have asterixis. Abdomen is full.  Bowel sounds are normal.  No bruit noted.  She remains with superficial and deep tenderness in midepigastric region.  Spleen is palpable. No pitting edema noted to feet or legs.  Labs/studies Results:   CBC Latest Ref Rng & Units 05/23/2019 05/21/2019 05/20/2019  WBC 4.0 - 10.5 K/uL 2.0(L) 3.2(L) 2.6(L)  Hemoglobin 12.0 - 15.0 g/dL 11.3(L) 11.2(L) 10.9(L)  Hematocrit 36.0 - 46.0 % 37.2 36.0 35.8(L)  Platelets 150 - 400 K/uL 49(L) 46(L) 46(L)    CMP Latest Ref Rng & Units 05/23/2019 05/22/2019 05/21/2019  Glucose 70 - 99 mg/dL 89 - -  BUN 8 - 23 mg/dL 14 - -  Creatinine 0.44 - 1.00 mg/dL 1.01(H) - -  Sodium 135 - 145 mmol/L 141 - -  Potassium 3.5 - 5.1 mmol/L 3.5 - -  Chloride 98 - 111 mmol/L 104 - -  CO2 22 - 32 mmol/L 28 - -  Calcium 8.9 - 10.3 mg/dL 8.5(L) - -  Total Protein 6.5 - 8.1 g/dL 6.2(L) 6.5 6.6  Total Bilirubin 0.3 - 1.2 mg/dL 1.9(H) 2.2(H) 2.1(H)  Alkaline Phos 38 - 126 U/L 61 63 65  AST 15 - 41 U/L 33 38 40  ALT 0 - 44 U/L _1 Hepatic Function Latest Ref Rng & Units 05/23/2019 05/22/2019 05/21/2019  Total Protein 6.5 - 8.1 g/dL 6.2(L) 6.5 6.6  Albumin 3.5 - 5.0 g/dL 2.7(L) 2.8(L) 2.8(L)  AST 15 - 41 U/L 33 38 40  ALT 0 - 44 U/L _2 Alk Phosphatase 38 - 126 U/L 61 63 65  Total Bilirubin 0.3 - 1.2 mg/dL 1.9(H) 2.2(H) 2.1(H)  Bilirubin, Direct 0.0 - 0.2 mg/dL - 0.5(H) 0.5(H)      Assessment:  #1. postprandial epigastric  pain.  She has undergone multiple studies including ultrasound CT MR and finally HIDA scan with [facility] yesterday.  None of the studies have shown any abnormality to account for her pain.  Have considered EGD but I am afraid it will be normal as well.  No wall thickening noted to stomach or small bowel on imaging studies.  Knowing that she has extensive atherosclerotic disease I am concerned that she may have abdominal angina.  Therefore we will proceed with CT abdomen and pelvis.  #2.  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  No evidence of GI bleed.  Ridging studies failed to show any suspicious lesions.  He has chronic thrombocytopenia.  #3.  Anemia appears to be due to chronic disease.  Plan:  Proceed with HIDA scan with CCK. If HIDA scan is normal will consider esophagogastroduodenoscopy for

## 2019-05-23 NOTE — Progress Notes (Signed)
SATURATION QUALIFICATIONS: (This note is used to comply with regulatory documentation for home oxygen)  Patient Saturations on Room Air at Rest = 91  Patient Saturations on Room Air while Ambulating = 85  Patient Saturations on 2 Liters of oxygen while Ambulating = 95  Please briefly explain why patient needs home oxygen: To maintain 02 sat at 90% or above during ambulation.  DTat

## 2019-05-24 DIAGNOSIS — I480 Paroxysmal atrial fibrillation: Secondary | ICD-10-CM

## 2019-05-24 LAB — BASIC METABOLIC PANEL
Anion gap: 9 (ref 5–15)
BUN: 13 mg/dL (ref 8–23)
CO2: 32 mmol/L (ref 22–32)
Calcium: 8.6 mg/dL — ABNORMAL LOW (ref 8.9–10.3)
Chloride: 101 mmol/L (ref 98–111)
Creatinine, Ser: 0.99 mg/dL (ref 0.44–1.00)
GFR calc Af Amer: >60 mL/min (ref >60)
GFR calc non Af Amer: 58 mL/min — ABNORMAL LOW (ref >60)
Glucose, Bld: 91 mg/dL (ref 70–99)
Potassium: 3.5 mmol/L (ref 3.5–5.1)
Sodium: 142 mmol/L (ref 135–145)

## 2019-05-24 LAB — CBC
HCT: 36.5 % (ref 36.0–46.0)
Hemoglobin: 11.4 g/dL — ABNORMAL LOW (ref 12.0–15.0)
MCH: 29.5 pg (ref 26.0–34.0)
MCHC: 31.2 g/dL (ref 30.0–36.0)
MCV: 94.6 fL (ref 80.0–100.0)
Platelets: 44 10*3/uL — ABNORMAL LOW (ref 150–400)
RBC: 3.86 MIL/uL — ABNORMAL LOW (ref 3.87–5.11)
RDW: 16.3 % — ABNORMAL HIGH (ref 11.5–15.5)
WBC: 2.2 10*3/uL — ABNORMAL LOW (ref 4.0–10.5)
nRBC: 0 % (ref 0.0–0.2)

## 2019-05-24 LAB — PROCALCITONIN: Procalcitonin: <0.1 ng/mL

## 2019-05-24 LAB — MAGNESIUM: Magnesium: 1.6 mg/dL — ABNORMAL LOW (ref 1.7–2.4)

## 2019-05-24 MED ORDER — POTASSIUM CHLORIDE CRYS ER 20 MEQ PO TBCR
20.0000 meq | EXTENDED_RELEASE_TABLET | Freq: Once | ORAL | Status: AC
  Administered 2019-05-24: 20 meq via ORAL
  Filled 2019-05-24: qty 1

## 2019-05-24 MED ORDER — MAGNESIUM OXIDE 400 (241.3 MG) MG PO TABS
800.0000 mg | ORAL_TABLET | Freq: Every day | ORAL | Status: DC
  Administered 2019-05-24: 800 mg via ORAL
  Filled 2019-05-24: qty 2

## 2019-05-24 MED ORDER — MAGNESIUM SULFATE 2 GM/50ML IV SOLN: 2.0000 g | Freq: Once | INTRAVENOUS | Status: DC

## 2019-05-24 NOTE — Progress Notes (Signed)
Patient returned from CT with IV infiltration.  Patient doesn't want to be stuck for a new IV stating that she may get to go home tomorrow.  On-call MD notified via text page.  MD okayed IV to stay out for now and changed IV pain medications to PO.  Will continue to monitor.

## 2019-05-24 NOTE — TOC Transition Note (Signed)
Transition of Care [facility]) - CM/SW Discharge Note   Patient Details  Name: Samantha [name] MRN: [id number] Date of Birth: 12/16/1950  [facility] CM/SW Contact:  Sherie Don, LCSW Phone Number: 05/24/2019, 10:23 AM   Clinical Narrative: TOC informed of patient's need for discharging with home O2. CSW called Blake Divine from [employer] to set up home O2. Patient notified that home O2 will be set up.   1:05pm: Spoke with Romualdo Bolk to set up PT for [facility]. TOC signing off.  Final next level of care: Home/Self Care Barriers to Discharge: Barriers Resolved  Patient Goals and CMS Choice Patient states their goals for this hospitalization and ongoing recovery are:: Return home CMS Medicare.gov Compare Post Acute Care list provided to:: Patient Choice offered to / list presented to : Patient  Discharge Placement   Discharge Plan and Services            DME Arranged: Oxygen DME Agency: [employer] Date DME Agency Contacted: 05/24/19 Time DME Agency Contacted: [phone] Representative spoke with at DME Agency: Blake Divine    Readmission Risk Interventions No flowsheet data found.

## 2019-05-24 NOTE — Discharge Summary (Signed)
Physician Discharge Summary  SHAMIA UPPAL OPF:[id number] DOB: Sep 08, 1950 DOA: 05/19/2019  PCP: [name], MD  Admit date: 05/19/2019 Discharge date: 05/24/2019  Admitted From: Home Disposition:  Home   Recommendations for Outpatient Follow-up:  1. Follow up with PCP in 1-2 weeks 2. Please obtain BMP/CBC in one week   Home Health: yes Equipment/Devices: 2L [place], HHPT  Discharge Condition: Stable CODE STATUS: FULL Diet recommendation: Heart Healthy   Brief/Interim Summary: 69 year old female with a history of systolic and diastolic CHF,NASHliver cirrhosis, CKD stage III, hypertension, hyperlipidemia, pancytopenia, paroxysmal atrial fibrillation presenting with 1 to 2-week history of substernal and epigastric abdominal pain. The patient states that this has been occurring for about 1 to 2 weeks and it is like a tearing sharp sensation. She denies any frank dysphagia or odynophagia, but did have some episodes of nausea and vomiting. There is no diarrhea, hematochezia, melena, fevers, chills. The patient took 4 baby aspirin nitroglycerin with minimal relief. She states that the pain is sharp and knifelike going directly to her back. She denies any dysphagia or odynophagia. Patient had low-grade temperature 99.9 F but was hemodynamically stable with oxygen saturation 96% on room air. CT abdomen and pelvis showed a cirrhotic liver with mild central hepatic ductal dilatation. CBD was dilated mildly but tapers distally. CTA chest was negative for dissection with bilateral lung scarring. MRCP showed mild intrahepatic and extrahepatic duct dilatation without choledocholithiasis. GI was consulted to assist with management.  ED Course:Initial vital signs temperature 97.5 F, pulse 73, respirations 13, blood pressure 97/47 mmHg and O2 sat 94% on room air. Patient was given 400 mg of ciprofloxacin IVPB, hydromorphone 1 mg IVP, Zofran 4 mg IVP and 2000 mL of NS bolus.  Her  urinalysis had a hazy appearance, but was otherwise unremarkable. Initial lactic acid was 2.6 and follow-up was 1.4 mmol/L. White count is 5.6 with a normal differential, hemoglobin 12.6 g/dL and platelets 64. Lipase was normal. CMP has normal electrolytes when calcium is corrected to albumin. Glucose 129, BUN 16 and creatinine 1.43 mg/dL (around patient's recent baseline creatinine level). Total protein 7.1 and albumin 3.2 g/dL. ALT was 51, ALT 17, alkaline phosphatase 81 and total  Discharge Diagnoses:  Intractable abdominal pain/chest pain -Lipase 24 -troponins negative -EKG--sinus, non specific T wave changes -CTA chest negative for PE or dissection. -CT abdomen/pelvis negative for acute findings -05/22/2019 HIDA scan negative for obstruction;EF 66% -MRCP negative for choledocholithiasis -Suspect a component of musculoskeletal pain -Appreciate GI consult-->feels more like its musculosk pain -3/25 CTA abd/pelvis--No evidence for significant stenosis involving the mesenteric vasculature. -diet was advanced which pt tolerated.  Overall pain was improving  Acute Respiratory Failure with hypoxia -new oxygen requirement -suspect pt has been chronically hypoxic at home in setting of COPD with ~40 pack year hx tobacco -desaturated to 85% with ambulation -repeat CXR--personallyreviewed--increased interstitial markings -check BNP--134 -lasix 40 mg IV x1 on 3/25 -d/c home with 2L [place]--ambulatory pulse ox <88 % in RA  NASH Liver Cirrhosis -Continue torsemide and spironolactone  Atypical chest pain -Troponins unremarkable -suspect musculoskeletal pain -Personally reviewed EKG--sinus rhythm, nonspecific T wave changes -Continue Imdur  Persistent atrial fibrillation -EKG shows sinus rhythm -not a candidate for AC due to cirrhosis, esophageal varices, severe thrombocytopenia -continue metoprolol succinate  Essential hypertension -Holding valsartan secondary to soft -continue  metoprolol succinate  Hyperlipidemia -Continue Zetia  Pancytopenia -Secondary to liver cirrhosis  Acute on Chronic systolic and diastolic CHF -Continue torsemide -05/20/19 Echo--EF50-55%, no Wma -lasix 40 mg IV  x 1 on 3/25  Acute kidney injury -Baseline creatinine 0.9-1.0 -Serum creatinine peaked 1.43 -Secondary to volume depletion initially -serum creatinine 0.99 on day of dc  Anxiety/depression -Continue fluoxetine     Discharge Instructions   Allergies as of 05/24/2019      Reactions   Entresto [sacubitril-valsartan] Swelling   Swelling of eyes   Acetaminophen Other (See Comments)   Liver problems   Oxycodone Itching, Nausea Only   Ace Inhibitors Other (See Comments)   UNSURE OF REACTION TYPE   Cefaclor Itching, Rash   Cephalexin Itching, Rash   Penicillins Rash   Has patient had a PCN reaction causing immediate rash, facial/tongue/throat swelling, SOB or lightheadedness with hypotension: YES Has patient had a PCN reaction causing severe rash involving mucus membranes or skin necrosis: NO Has patient had a PCN reaction that required hospitalization: YES Has patient had a PCN reaction occurring within the last 10 years: NO If all of the above answers are "NO", then may proceed with Cephalosporin use.   Pregabalin Other (See Comments)   Retains fluid   Tape Itching, Rash   Takes skin right off with medical tape--PAPER TAPE ONLY      Medication List    STOP taking these medications   valsartan 160 MG tablet Commonly known as: DIOVAN     TAKE these medications   albuterol 108 (90 Base) MCG/ACT inhaler Commonly known as: VENTOLIN HFA Inhale 2 puffs into the lungs every 6 (six) hours as needed for wheezing or shortness of breath.   ezetimibe 10 MG tablet Commonly known as: ZETIA Take 10 mg by mouth at bedtime.   FLUoxetine 20 MG capsule Commonly known as: PROZAC Take 20 mg by mouth daily.   HYDROcodone-acetaminophen 10-325 MG tablet Commonly  known as: NORCO Take 1 tablet by mouth every 6 (six) hours as needed for moderate pain.   hydroxypropyl methylcellulose / hypromellose 2.5 % ophthalmic solution Commonly known as: ISOPTO TEARS / GONIOVISC Place 1 drop into both eyes 3 (three) times daily as needed for dry eyes.   isosorbide mononitrate 60 MG 24 hr tablet Commonly known as: IMDUR Take 1.5 tablets (90 mg total) by mouth daily.   metoprolol succinate 25 MG 24 hr tablet Commonly known as: Toprol XL Take 1 tablet (25 mg total) by mouth daily.   Nitrostat 0.4 MG SL tablet Generic drug: nitroGLYCERIN Place 0.4 mg under the tongue every 5 (five) minutes as needed for chest pain (x 3 tabs daily).   pantoprazole 40 MG tablet Commonly known as: PROTONIX Take 40 mg by mouth daily.   polyethylene glycol 17 g packet Commonly known as: MIRALAX / GLYCOLAX Take 17 g by mouth daily.   spironolactone 25 MG tablet Commonly known as: ALDACTONE Take 1 tablet (25 mg total) by mouth daily.   torsemide 20 MG tablet Commonly known as: DEMADEX Take 2 tablets (40 mg total) by mouth daily.            Durable Medical Equipment  (From admission, onward)         Start     Ordered   05/23/19 1314  For home use only DME oxygen  Once    Question Answer Comment  Length of Need 12 Months   Mode or (Route) Nasal cannula   Liters per Minute 2   Frequency Continuous (stationary and portable oxygen unit needed)   Oxygen conserving device Yes   Oxygen delivery system Gas      05/23/19 1313  Allergies  Allergen Reactions  . Entresto [Sacubitril-Valsartan] Swelling    Swelling of eyes  . Acetaminophen Other (See Comments)    Liver problems  . Oxycodone Itching and Nausea Only  . Ace Inhibitors Other (See Comments)    UNSURE OF REACTION TYPE   . Cefaclor Itching and Rash  . Cephalexin Itching and Rash  . Penicillins Rash    Has patient had a PCN reaction causing immediate rash, facial/tongue/throat swelling, SOB  or lightheadedness with hypotension: YES Has patient had a PCN reaction causing severe rash involving mucus membranes or skin necrosis: NO Has patient had a PCN reaction that required hospitalization: YES Has patient had a PCN reaction occurring within the last 10 years: NO If all of the above answers are "NO", then may proceed with Cephalosporin use.   . Pregabalin Other (See Comments)    Retains fluid  . Tape Itching and Rash    Takes skin right off with medical tape--PAPER TAPE ONLY    Consultations:  GI--Rehman   Procedures/Studies: DG Chest 2 View  Result Date: 05/23/2019 CLINICAL DATA:  Hypoxia and chest pain EXAM: CHEST - 2 VIEW COMPARISON:  May 19, 2019 FINDINGS: There is ill-defined airspace opacity with atelectasis in the left mid lung. There is atelectatic change in the left upper lobe and left mid lung regions. Heart is upper normal in size with pulmonary vascularity normal. Patient is status post coronary artery bypass grafting. There is aortic atherosclerosis. No adenopathy. Evidence of prior compression fracture at T12, status post kyphoplasty. IMPRESSION: Ill-defined opacity left mid lung anteriorly. There is atelectatic change in this area with probable developing pneumonia. Atelectatic change also noted in right upper lobe and right mid lung regions. Mild cardiomegaly. No adenopathy. Aortic Atherosclerosis (ICD10-I70.0). Electronically Signed   By: Lowella Grip III M.D.   On: 05/23/2019 14:36   DG Chest 2 View  Result Date: 05/17/2019 CLINICAL DATA:  Chest pain EXAM: CHEST - 2 VIEW COMPARISON:  04/18/2019 FINDINGS: The heart size remains significantly enlarged. The patient is status post prior median sternotomy. There is a stable linear area of scarring/atelectasis in the left mid lung zone. There is vascular congestion without overt pulmonary edema. There is no large pleural effusion. Aortic calcifications are noted. The patient is status post prior vertebral  augmentation of the L1 vertebral body. Osteopenia is noted throughout the thoracic spine. There is no acute displaced fracture or dislocation. IMPRESSION: Cardiomegaly with vascular congestion. Electronically Signed   By: Constance Holster M.D.   On: 05/17/2019 17:41   US Abdomen Complete  Result Date: 05/13/2019 CLINICAL DATA:  Cirrhosis. EXAM: ABDOMEN ULTRASOUND COMPLETE COMPARISON:  Abdominal ultrasound dated November 08, 2018. FINDINGS: Gallbladder: Sludge. No gallstones. Mild wall thickening. No sonographic Murphy sign noted by sonographer. Common bile duct: Diameter: 7 mm, at the upper limits of normal. Liver: No focal lesion identified. Unchanged nodular contour with heterogeneously increased parenchymal echogenicity. Portal vein is patent on color Doppler imaging with normal direction of blood flow towards the liver. IVC: No abnormality visualized. Pancreas: Visualized portion unremarkable. Spleen: Slightly increased moderate splenomegaly with splenic volume of 1411 cc, previously 1315 cc. Right Kidney: Length: 12.1. Echogenicity within normal limits. No mass or hydronephrosis visualized. Left Kidney: Length: 11.5. Echogenicity within normal limits. No mass or hydronephrosis visualized. Abdominal aorta: No aneurysm visualized. Other findings: None. IMPRESSION: 1. Unchanged cirrhosis. 2. Slightly increased moderate splenomegaly. 3. Gallbladder sludge with mild wall thickening, presumably related to underlying liver disease in the absence of acute right  upper quadrant pain. Electronically Signed   By: Titus Dubin M.D.   On: 05/13/2019 09:23   CT ABDOMEN PELVIS W CONTRAST  Result Date: 05/19/2019 CLINICAL DATA:  Abdominal pain EXAM: CT ABDOMEN AND PELVIS WITH CONTRAST TECHNIQUE: Multidetector CT imaging of the abdomen and pelvis was performed using the standard protocol following bolus administration of intravenous contrast. CONTRAST:  54m OMNIPAQUE IOHEXOL 300 MG/ML  SOLN COMPARISON:  None.  FINDINGS: Lower chest: Mild bibasilar scarring. Hepatobiliary: Cirrhosis. Gallbladder is unremarkable. Mild central intrahepatic duct dilatation. Common bile duct is mildly dilated but tapers distally. Pancreas: Unremarkable. Spleen: Splenomegaly Adrenals/Urinary Tract: Bilateral nonobstructing renal calculi measuring up to 3 mm. Adrenals are unremarkable. The bladder is obscured by hip arthroplasty. Stomach/Bowel: Stomach is within limits apart from varices. Bowel is normal in caliber. Bowel loops in the pelvis are obscured by artifact. Normal appendix. Vascular/Lymphatic: Prominent gastric varices. Paraumbilical collateral veins. Small lower paraesophageal varices. Portal vein is patent. Diffuse aortic atherosclerosis. Reproductive: Pelvis is obscured by artifact. Other: No ascites. Musculoskeletal: Postoperative changes L4-L5 and L5-S1. Chronic compression deformity of L1 with cement augmentation right hip arthroplasty with streak artifact IMPRESSION: Cirrhosis with evidence of portal hypertension.  No ascites. Mild central intrahepatic duct dilatation. Common bile duct is mildly dilated but tapers distally. Small nonobstructing renal calculi. Electronically Signed   By: PMacy MisM.D.   On: 05/19/2019 21:01   MR 3D Recon At Scanner  Result Date: 05/21/2019 CLINICAL DATA:  Cirrhosis and portal hypertension. Dilated common bile duct on recent CT scan. EXAM: MRI ABDOMEN WITHOUT AND WITH CONTRAST (INCLUDING MRCP) TECHNIQUE: Multiplanar multisequence MR imaging of the abdomen was performed both before and after the administration of intravenous contrast. Heavily T2-weighted images of the biliary and pancreatic ducts were obtained, and three-dimensional MRCP images were rendered by post processing. CONTRAST:  9100mGADAVIST GADOBUTROL 1 MMOL/ML IV SOLN COMPARISON:  CT 05/19/2019. MRI 07/18/2016 FINDINGS: Markedly motion degraded study. Lower chest: Atelectasis noted in the dependent lung bases. Hepatobiliary:  Nodular contour with enlargement of the lateral segment left liver suggests cirrhosis. No evidence for arterial phase hyperintense mint within the liver parenchyma although marked motion artifact could obscure small or subtle lesions. Potential tiny layering gallstones although not a definite finding due to motion artifact. Trace intrahepatic biliary duct prominence evident. Extrahepatic common duct measures 9 mm diameter in the head of the pancreas, mildly increased. No features of choledocholithiasis on the current study. Pancreas:  6 mm cystic focus identified in the tail of pancreas Spleen:  17.3 cm craniocaudal length. No focal abnormality. Adrenals/Urinary Tract: No adrenal nodule or mass. Kidneys unremarkable. Stomach/Bowel: Stomach is unremarkable. No gastric wall thickening. No evidence of outlet obstruction. No small bowel or colonic dilatation within the visualized abdomen. Vascular/Lymphatic: No abdominal aortic aneurysm. The portal vein, superior mesenteric vein, and splenic vein appear patent. Extensive vascular collateralization noted left abdomen with paraesophageal varices. Recanalization of the paraumbilical vein evident. There is no gastrohepatic or hepatoduodenal ligament lymphadenopathy. No retroperitoneal or mesenteric lymphadenopathy. Other:  No intraperitoneal free fluid. Musculoskeletal: Paraumbilical ventral hernia has been incompletely visualized but was noted on previous CT to contain venous collaterals. No suspicious abnormal marrow enhancement within the visualized bony anatomy. IMPRESSION: 1. Markedly motion degraded study. 2. Morphologic features of the liver compatible with cirrhosis. No evidence for arterial phase hyperenhancement to suggest hepatoma although tiny or subtle lesions could be obscured by the artifact. 3. Evidence of portal venous hypertension with splenomegaly, paraesophageal varices and recanalization of the paraumbilical vein. 4.  Mild intra and extrahepatic biliary  duct dilatation without findings of choledocholithiasis. 5. 6 mm cystic focus in the tail of the pancreas. Repeat MRI in 2 years recommended. This recommendation follows ACR consensus guidelines: Management of Incidental Pancreatic Cysts: A White Paper of the ACR Incidental Findings Committee. J Am Coll Radiol 0240;97:353-299. 6. Paraumbilical midline ventral hernia contains venous collaterals. Electronically Signed   By: Misty Stanley M.D.   On: 05/21/2019 10:04   NM Hepato W/EF  Result Date: 05/22/2019 CLINICAL DATA:  RIGHT upper quadrant pain, nondiagnostic ultrasound, abdominal pain with nausea and vomiting for few days EXAM: NUCLEAR MEDICINE HEPATOBILIARY IMAGING WITH GALLBLADDER EF TECHNIQUE: Sequential images of the abdomen were obtained out to 60 minutes following intravenous administration of radiopharmaceutical. After oral ingestion of Ensure, gallbladder ejection fraction was determined. At 60 min, normal ejection fraction is greater than 33%. RADIOPHARMACEUTICALS:  4.5 mCi Tc-67m Choletec IV COMPARISON:  12/29/2010 FINDINGS: Normal tracer extraction from bloodstream indicating normal hepatocellular function. Normal excretion of tracer into biliary tree. Gallbladder visualized at 26 min. Small bowel visualized at 19 min. No hepatic retention of tracer. Subjectively normal emptying of tracer from gallbladder following fatty meal stimulation. Calculated gallbladder ejection fraction is 66%, normal. Normal gallbladder ejection fraction following Ensure ingestion is greater than 33% at 1 hour. IMPRESSION: Patent biliary tree with normal gallbladder ejection fraction of 66% following fatty meal stimulation. Electronically Signed   By: MLavonia DanaM.D.   On: 05/22/2019 12:06   DG Chest Port 1 View  Result Date: 05/19/2019 CLINICAL DATA:  Shortness of breath EXAM: PORTABLE CHEST 1 VIEW COMPARISON:  05/17/2019 FINDINGS: Cardiomegaly, vascular congestion. Prior CABG. Lingular atelectasis. No confluent  opacities or effusions. No acute bony abnormality. IMPRESSION: Cardiomegaly with vascular congestion. Lingular atelectasis. Electronically Signed   By: KRolm BaptiseM.D.   On: 05/19/2019 21:05   MR ABDOMEN MRCP W WO CONTAST  Result Date: 05/21/2019 CLINICAL DATA:  Cirrhosis and portal hypertension. Dilated common bile duct on recent CT scan. EXAM: MRI ABDOMEN WITHOUT AND WITH CONTRAST (INCLUDING MRCP) TECHNIQUE: Multiplanar multisequence MR imaging of the abdomen was performed both before and after the administration of intravenous contrast. Heavily T2-weighted images of the biliary and pancreatic ducts were obtained, and three-dimensional MRCP images were rendered by post processing. CONTRAST:  967mGADAVIST GADOBUTROL 1 MMOL/ML IV SOLN COMPARISON:  CT 05/19/2019. MRI 07/18/2016 FINDINGS: Markedly motion degraded study. Lower chest: Atelectasis noted in the dependent lung bases. Hepatobiliary: Nodular contour with enlargement of the lateral segment left liver suggests cirrhosis. No evidence for arterial phase hyperintense mint within the liver parenchyma although marked motion artifact could obscure small or subtle lesions. Potential tiny layering gallstones although not a definite finding due to motion artifact. Trace intrahepatic biliary duct prominence evident. Extrahepatic common duct measures 9 mm diameter in the head of the pancreas, mildly increased. No features of choledocholithiasis on the current study. Pancreas:  6 mm cystic focus identified in the tail of pancreas Spleen:  17.3 cm craniocaudal length. No focal abnormality. Adrenals/Urinary Tract: No adrenal nodule or mass. Kidneys unremarkable. Stomach/Bowel: Stomach is unremarkable. No gastric wall thickening. No evidence of outlet obstruction. No small bowel or colonic dilatation within the visualized abdomen. Vascular/Lymphatic: No abdominal aortic aneurysm. The portal vein, superior mesenteric vein, and splenic vein appear patent. Extensive  vascular collateralization noted left abdomen with paraesophageal varices. Recanalization of the paraumbilical vein evident. There is no gastrohepatic or hepatoduodenal ligament lymphadenopathy. No retroperitoneal or mesenteric lymphadenopathy. Other:  No intraperitoneal free  fluid. Musculoskeletal: Paraumbilical ventral hernia has been incompletely visualized but was noted on previous CT to contain venous collaterals. No suspicious abnormal marrow enhancement within the visualized bony anatomy. IMPRESSION: 1. Markedly motion degraded study. 2. Morphologic features of the liver compatible with cirrhosis. No evidence for arterial phase hyperenhancement to suggest hepatoma although tiny or subtle lesions could be obscured by the artifact. 3. Evidence of portal venous hypertension with splenomegaly, paraesophageal varices and recanalization of the paraumbilical vein. 4. Mild intra and extrahepatic biliary duct dilatation without findings of choledocholithiasis. 5. 6 mm cystic focus in the tail of the pancreas. Repeat MRI in 2 years recommended. This recommendation follows ACR consensus guidelines: Management of Incidental Pancreatic Cysts: A White Paper of the ACR Incidental Findings Committee. J Am Coll Radiol 8588;50:277-412. 6. Paraumbilical midline ventral hernia contains venous collaterals. Electronically Signed   By: Misty Stanley M.D.   On: 05/21/2019 10:04   CT ANGIO CHEST AORTA W/CM & OR WO/CM  Result Date: 05/17/2019 CLINICAL DATA:  Chest pain radiating to back EXAM: CT ANGIOGRAPHY CHEST WITH CONTRAST TECHNIQUE: Multidetector CT imaging of the chest was performed using the standard protocol during bolus administration of intravenous contrast. Multiplanar CT image reconstructions and MIPs were obtained to evaluate the vascular anatomy. CONTRAST:  15m OMNIPAQUE IOHEXOL 350 MG/ML SOLN COMPARISON:  03/25/2008 FINDINGS: Cardiovascular: Cardiomegaly. Prior CABG. Diffuse aortic atherosclerosis. No aneurysm or  dissection. No central pulmonary embolus. Mediastinum/Nodes: No mediastinal, hilar, or axillary adenopathy. Trachea and esophagus are unremarkable. Thyroid unremarkable. Lungs/Pleura: Areas of scarring in the lungs bilaterally. No confluent airspace opacities or effusions. Upper Abdomen: Nodular surface of the liver compatible with cirrhosis. Associated splenomegaly, partially imaged. Upper abdominal and and lower esophageal varices noted. Musculoskeletal: Chest wall soft tissues are unremarkable. No acute bony abnormality. Review of the MIP images confirms the above findings. IMPRESSION: Diffuse aortic atherosclerosis.  No aneurysm or dissection. Cardiomegaly, prior CABG. Areas of scarring in the lungs. No acute cardiopulmonary disease. Changes of cirrhosis with upper abdominal varices and splenomegaly. Electronically Signed   By: KRolm BaptiseM.D.   On: 05/17/2019 21:26   ECHOCARDIOGRAM COMPLETE  Result Date: 05/20/2019    ECHOCARDIOGRAM REPORT   Patient Name:   GCORONA POPOVICHDate of Exam: 05/20/2019 Medical Rec #:  [id number]     Height:       63.0 in Accession #:    [id number]    Weight:       190.2 lb Date of Birth:  6Oct 18, 1952     BSA:          1.893 m Patient Age:    89years       BP:           116/47 mmHg Patient Gender: F              HR:           75 bpm. Exam Location:  [facility] NaProcedure: 2D Echo Indications:    Chest Pain 786.50 / R07.9                 Congestive Heart Failure 428.0 / I50.9  History:        Patient has prior history of Echocardiogram examinations, most                 recent 11/15/2017. CHF, CAD, Prior CABG, Arrythmias:Atrial                 Fibrillation; Risk Factors:Hypertension, Dyslipidemia and  Former                 Smoker.  Sonographer:    Leavy Cella RDCS (AE) Referring Phys: [id number] Shawnell Dykes MANUEL [place]  1. Left ventricular ejection fraction, by estimation, is 50 to 55%. The left ventricle has low normal function. The left ventricle has no regional wall  motion abnormalities. There is mild left ventricular hypertrophy. Left ventricular diastolic parameters are indeterminate.  2. Right ventricular systolic function is low normal. The right ventricular size is normal. There is normal pulmonary artery systolic pressure. The estimated right ventricular systolic pressure is 08.1 mmHg.  3. Left atrial size was moderately dilated.  4. Right atrial size was mildly dilated.  5. The mitral valve is grossly normal. Mild mitral valve regurgitation.  6. Tricuspid valve regurgitation is mild to moderate.  7. The aortic valve is tricuspid. Aortic valve regurgitation is not visualized.  8. The inferior vena cava is normal in size with greater than 50% respiratory variability, suggesting right atrial pressure of 3 mmHg. FINDINGS  Left Ventricle: Left ventricular ejection fraction, by estimation, is 50 to 55%. The left ventricle has low normal function. The left ventricle has no regional wall motion abnormalities. The left ventricular internal cavity size was normal in size. There is mild left ventricular hypertrophy. Left ventricular diastolic parameters are indeterminate. Right Ventricle: The right ventricular size is normal. No increase in right ventricular wall thickness. Right ventricular systolic function is low normal. There is normal pulmonary artery systolic pressure. The tricuspid regurgitant velocity is 2.46 m/s,  and with an assumed right atrial pressure of 3 mmHg, the estimated right ventricular systolic pressure is 44.8 mmHg. Left Atrium: Left atrial size was moderately dilated. Right Atrium: Right atrial size was mildly dilated. Pericardium: There is no evidence of pericardial effusion. Mitral Valve: The mitral valve is grossly normal. Mild mitral valve regurgitation. Tricuspid Valve: The tricuspid valve is grossly normal. Tricuspid valve regurgitation is mild to moderate. Aortic Valve: The aortic valve is tricuspid. Aortic valve regurgitation is not visualized. Mild  aortic valve annular calcification. Pulmonic Valve: The pulmonic valve was grossly normal. Pulmonic valve regurgitation is trivial. Aorta: The aortic root is normal in size and structure. Venous: The inferior vena cava is normal in size with greater than 50% respiratory variability, suggesting right atrial pressure of 3 mmHg. IAS/Shunts: No atrial level shunt detected by color flow Doppler.  LEFT VENTRICLE PLAX 2D LVIDd:         4.70 cm  Diastology LVIDs:         3.41 cm  LV e' medial:   5.44 cm/s LV PW:         1.20 cm  LV E/e' medial: 25.6 LV IVS:        1.09 cm LVOT diam:     1.90 cm LV SV:         68 LV SV Index:   36 LVOT Area:     2.84 cm  RIGHT VENTRICLE RV S prime:     6.98 cm/s TAPSE (M-mode): 1.9 cm LEFT ATRIUM             Index LA diam:        5.10 cm 2.69 cm/m LA Vol (A2C):   89.7 ml 47.39 ml/m LA Vol (A4C):   73.8 ml 38.99 ml/m LA Biplane Vol: 81.9 ml 43.27 ml/m  AORTIC VALVE LVOT Vmax:   95.00 cm/s LVOT Vmean:  67.400 cm/s LVOT VTI:    0.241 m  AORTA Ao Root diam: 2.40 cm MITRAL VALVE                TRICUSPID VALVE MV Area (PHT): 3.31 cm     TR Peak grad:   24.2 mmHg MV Decel Time: 229 msec     TR Vmax:        246.00 cm/s MV E velocity: 139.00 cm/s MV A velocity: 102.00 cm/s  SHUNTS MV E/A ratio:  1.36         Systemic VTI:  0.24 m                             Systemic Diam: 1.90 cm Rozann Lesches MD Electronically signed by Rozann Lesches MD Signature Date/Time: 05/20/2019/2:36:36 PM    Final    CT Angio Abd/Pel w/ and/or w/o  Result Date: 05/23/2019 CLINICAL DATA:  Epigastric pain. 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 EXAM: CTA ABDOMEN AND PELVIS WITHOUT AND WITH CONTRAST TECHNIQUE: Multidetector CT imaging of the abdomen and pelvis was performed using the standard protocol during bolus administration of intravenous contrast. Multiplanar reconstructed images and MIPs were obtained and reviewed to evaluate the vascular anatomy. CONTRAST:  165m OMNIPAQUE IOHEXOL 350 MG/ML  SOLN COMPARISON:  May 19, 2018 FINDINGS: VASCULAR Aorta: There are advanced atherosclerotic changes throughout the abdominal aorta. There is no evidence for an abdominal aortic aneurysm. There is a high-grade stenosis of the distal abdominal aorta proximal to the aortic bifurcation. Celiac: Patent without evidence of aneurysm, dissection, vasculitis or significant stenosis. There is conventional hepatic arterial anatomy. SMA: There is mild atherosclerotic disease at the origin of the SMA without evidence for high-grade stenosis. Renals: The right renal artery is patent. There are 2 left renal arteries, both of which are patent. IMA: There is moderate narrowing at the origin of the IMA without evidence for high-grade stenosis. Inflow: There is high-grade narrowing at the origin of the bilateral common iliac arteries. There is moderate narrowing at the distal left common iliac artery. There is a high-grade stenosis at the origin of the left internal iliac artery. The bilateral external iliac arteries are patent. Proximal Outflow: Bilateral common femoral and visualized portions of the superficial and profunda femoral arteries are patent without evidence of aneurysm, dissection, vasculitis or significant stenosis. Veins: No obvious venous abnormality within the limitations of this arterial phase study. There is recanalization of the umbilical vein. 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 Review of the MIP images confirms the above findings. NON-VASCULAR Lower chest: There is atelectasis at the lung bases bilaterally.There is a trace left-sided pleural effusion. There is significant cardiomegaly. Hepatobiliary: Again identified are findings consistent with cirrhosis. There is recanalization of the umbilical vein consistent with portal hypertension. There is no discrete hepatic mass. The gallbladder is decompressed.There is no biliary ductal dilation.  Pancreas: Normal contours without ductal dilatation. No peripancreatic fluid collection. Spleen: The spleen is significantly enlarged. Adrenals/Urinary Tract: --Adrenal glands: No adrenal hemorrhage. --Right kidney/ureter: No hydronephrosis or perinephric hematoma. --Left kidney/ureter: No hydronephrosis or perinephric hematoma. --Urinary bladder: Unremarkable. Stomach/Bowel: --Stomach/Duodenum: No hiatal hernia or other gastric abnormality. Normal duodenal course and caliber. --Small bowel: No dilatation or inflammation. --Colon: Rectosigmoid diverticulosis without acute inflammation. --Appendix: Normal. Lymphatic: --No retroperitoneal lymphadenopathy. --No mesenteric lymphadenopathy. --No pelvic or inguinal lymphadenopathy. Reproductive: Unremarkable Other: No ascites or free air. The abdominal wall is normal. Musculoskeletal. The patient is status post  prior vertebral augmentation of the L1 vertebral body. There are interbody spacers at the L4-L5 and L5-S1 levels. The patient is status post total hip arthroplasty on the right. There are end-stage degenerative changes of the left femoroacetabular joint. IMPRESSION: 1. No evidence for significant stenosis involving the mesenteric vasculature. 2. There is a high-grade stenosis of the distal abdominal aorta, proximal to the aortic bifurcation. There is a high-grade stenosis involving the bilateral proximal common iliac arteries. 3. 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 4. Significant cardiomegaly. 5. Bibasilar atelectasis with a trace left-sided pleural effusion. 6. Additional chronic findings as detailed above. 7.  Aortic Atherosclerosis (ICD10-I70.0). Electronically Signed   By: Constance Holster M.D.   On: 05/23/2019 22:44         Discharge Exam: Vitals:   05/23/19 2139 05/24/19 0555  BP: (!) 101/53 (!) 112/40  Pulse: 80 78  Resp: 20 16  Temp: 98.8 F (37.1 C) 98.2 F (36.8 C)  SpO2: 93% 92%   Vitals:   05/23/19 0836 05/23/19 1451 05/23/19 2139 05/24/19 0555  BP: (!) 114/34 (!) 103/43 (!) 101/53 (!) 112/40  Pulse: 70 77 80 78  Resp:  16 20 16   Temp:  98.2 F (36.8 C) 98.8 F (37.1 C) 98.2 F (36.8 C)  TempSrc:  Oral Oral Oral  SpO2:  92% 93% 92%  Weight:      Height:        General: Pt is alert, awake, not in acute distress Cardiovascular: RRR, S1/S2 +, no rubs, no gallops Respiratory: bibasilar crackles. No wheeze Abdominal: Soft, NT, ND, bowel sounds + Extremities: non pitting edema, no cyanosis   The results of significant diagnostics from this hospitalization (including imaging, microbiology, ancillary and laboratory) are listed below for reference.    Significant Diagnostic Studies: DG Chest 2 View  Result Date: 05/23/2019 CLINICAL DATA:  Hypoxia and chest pain EXAM: CHEST - 2 VIEW COMPARISON:  May 19, 2019 FINDINGS: There is ill-defined airspace opacity with atelectasis in the left mid lung. There is atelectatic change in the left upper lobe and left mid lung regions. Heart is upper normal in size with pulmonary vascularity normal. Patient is status post coronary artery bypass grafting. There is aortic atherosclerosis. No adenopathy. Evidence of prior compression fracture at T12, status post kyphoplasty. IMPRESSION: Ill-defined opacity left mid lung anteriorly. There is atelectatic change in this area with probable developing pneumonia. Atelectatic change also noted in right upper lobe and right mid lung regions. Mild cardiomegaly. No adenopathy. Aortic Atherosclerosis (ICD10-I70.0). Electronically Signed   By: Lowella Grip III M.D.   On: 05/23/2019 14:36   DG Chest 2 View  Result Date: 05/17/2019 CLINICAL DATA:  Chest pain EXAM: CHEST - 2 VIEW COMPARISON:  04/18/2019  FINDINGS: The heart size remains significantly enlarged. The patient is status post prior median sternotomy. There is a stable linear area of scarring/atelectasis in the left mid lung zone. There is vascular congestion without overt pulmonary edema. There is no large pleural effusion. Aortic calcifications are noted. The patient is status post prior vertebral augmentation of the L1 vertebral body. Osteopenia is noted throughout the thoracic spine. There is no acute displaced fracture or dislocation. IMPRESSION: Cardiomegaly with vascular congestion. Electronically Signed   By: Constance Holster M.D.   On: 05/17/2019 17:41   US  Abdomen Complete  Result Date: 05/13/2019 CLINICAL DATA:  Cirrhosis. EXAM: ABDOMEN ULTRASOUND COMPLETE COMPARISON:  Abdominal ultrasound dated November 08, 2018. FINDINGS: Gallbladder: Sludge. No gallstones. Mild wall thickening. No sonographic Murphy sign noted by sonographer. Common bile duct: Diameter: 7 mm, at the upper limits of normal. Liver: No focal lesion identified. Unchanged nodular contour with heterogeneously increased parenchymal echogenicity. Portal vein is patent on color Doppler imaging with normal direction of blood flow towards the liver. IVC: No abnormality visualized. Pancreas: Visualized portion unremarkable. Spleen: Slightly increased moderate splenomegaly with splenic volume of 1411 cc, previously 1315 cc. Right Kidney: Length: 12.1. Echogenicity within normal limits. No mass or hydronephrosis visualized. Left Kidney: Length: 11.5. Echogenicity within normal limits. No mass or hydronephrosis visualized. Abdominal aorta: No aneurysm visualized. Other findings: None. IMPRESSION: 1. Unchanged cirrhosis. 2. Slightly increased moderate splenomegaly. 3. Gallbladder sludge with mild wall thickening, presumably related to underlying liver disease in the absence of acute right upper quadrant pain. Electronically Signed   By: Titus Dubin M.D.   On: 05/13/2019 09:23    CT ABDOMEN PELVIS W CONTRAST  Result Date: 05/19/2019 CLINICAL DATA:  Abdominal pain EXAM: CT ABDOMEN AND PELVIS WITH CONTRAST TECHNIQUE: Multidetector CT imaging of the abdomen and pelvis was performed using the standard protocol following bolus administration of intravenous contrast. CONTRAST:  36m OMNIPAQUE IOHEXOL 300 MG/ML  SOLN COMPARISON:  None. FINDINGS: Lower chest: Mild bibasilar scarring. Hepatobiliary: Cirrhosis. Gallbladder is unremarkable. Mild central intrahepatic duct dilatation. Common bile duct is mildly dilated but tapers distally. Pancreas: Unremarkable. Spleen: Splenomegaly Adrenals/Urinary Tract: Bilateral nonobstructing renal calculi measuring up to 3 mm. Adrenals are unremarkable. The bladder is obscured by hip arthroplasty. Stomach/Bowel: Stomach is within limits apart from varices. Bowel is normal in caliber. Bowel loops in the pelvis are obscured by artifact. Normal appendix. Vascular/Lymphatic: Prominent gastric varices. Paraumbilical collateral veins. Small lower paraesophageal varices. Portal vein is patent. Diffuse aortic atherosclerosis. Reproductive: Pelvis is obscured by artifact. Other: No ascites. Musculoskeletal: Postoperative changes L4-L5 and L5-S1. Chronic compression deformity of L1 with cement augmentation right hip arthroplasty with streak artifact IMPRESSION: Cirrhosis with evidence of portal hypertension.  No ascites. Mild central intrahepatic duct dilatation. Common bile duct is mildly dilated but tapers distally. Small nonobstructing renal calculi. Electronically Signed   By: PMacy MisM.D.   On: 05/19/2019 21:01   MR 3D Recon At Scanner  Result Date: 05/21/2019 CLINICAL DATA:  Cirrhosis and portal hypertension. Dilated common bile duct on recent CT scan. EXAM: MRI ABDOMEN WITHOUT AND WITH CONTRAST (INCLUDING MRCP) TECHNIQUE: Multiplanar multisequence MR imaging of the abdomen was performed both before and after the administration of intravenous  contrast. Heavily T2-weighted images of the biliary and pancreatic ducts were obtained, and three-dimensional MRCP images were rendered by post processing. CONTRAST:  920mGADAVIST GADOBUTROL 1 MMOL/ML IV SOLN COMPARISON:  CT 05/19/2019. MRI 07/18/2016 FINDINGS: Markedly motion degraded study. Lower chest: Atelectasis noted in the dependent lung bases. Hepatobiliary: Nodular contour with enlargement of the lateral segment left liver suggests cirrhosis. No evidence for arterial phase hyperintense mint within the liver parenchyma although marked motion artifact could obscure small or subtle lesions. Potential tiny layering gallstones although not a definite finding due to motion artifact. Trace intrahepatic biliary duct prominence evident. Extrahepatic common duct measures 9 mm diameter in the head of the pancreas, mildly increased. No features of choledocholithiasis on the current study. Pancreas:  6 mm cystic focus identified in the tail of pancreas Spleen:  17.3 cm craniocaudal length. No focal  abnormality. Adrenals/Urinary Tract: No adrenal nodule or mass. Kidneys unremarkable. Stomach/Bowel: Stomach is unremarkable. No gastric wall thickening. No evidence of outlet obstruction. No small bowel or colonic dilatation within the visualized abdomen. Vascular/Lymphatic: No abdominal aortic aneurysm. The portal vein, superior mesenteric vein, and splenic vein appear patent. Extensive vascular collateralization noted left abdomen with paraesophageal varices. Recanalization of the paraumbilical vein evident. There is no gastrohepatic or hepatoduodenal ligament lymphadenopathy. No retroperitoneal or mesenteric lymphadenopathy. Other:  No intraperitoneal free fluid. Musculoskeletal: Paraumbilical ventral hernia has been incompletely visualized but was noted on previous CT to contain venous collaterals. No suspicious abnormal marrow enhancement within the visualized bony anatomy. IMPRESSION: 1. Markedly motion degraded study.  2. Morphologic features of the liver compatible with cirrhosis. No evidence for arterial phase hyperenhancement to suggest hepatoma although tiny or subtle lesions could be obscured by the artifact. 3. Evidence of portal venous hypertension with splenomegaly, paraesophageal varices and recanalization of the paraumbilical vein. 4. Mild intra and extrahepatic biliary duct dilatation without findings of choledocholithiasis. 5. 6 mm cystic focus in the tail of the pancreas. Repeat MRI in 2 years recommended. This recommendation follows ACR consensus guidelines: Management of Incidental Pancreatic Cysts: A White Paper of the ACR Incidental Findings Committee. J Am Coll Radiol 7681;15:726-203. 6. Paraumbilical midline ventral hernia contains venous collaterals. Electronically Signed   By: Misty Stanley M.D.   On: 05/21/2019 10:04   NM Hepato W/EF  Result Date: 05/22/2019 CLINICAL DATA:  RIGHT upper quadrant pain, nondiagnostic ultrasound, abdominal pain with nausea and vomiting for few days EXAM: NUCLEAR MEDICINE HEPATOBILIARY IMAGING WITH GALLBLADDER EF TECHNIQUE: Sequential images of the abdomen were obtained out to 60 minutes following intravenous administration of radiopharmaceutical. After oral ingestion of Ensure, gallbladder ejection fraction was determined. At 60 min, normal ejection fraction is greater than 33%. RADIOPHARMACEUTICALS:  4.5 mCi Tc-50m Choletec IV COMPARISON:  12/29/2010 FINDINGS: Normal tracer extraction from bloodstream indicating normal hepatocellular function. Normal excretion of tracer into biliary tree. Gallbladder visualized at 26 min. Small bowel visualized at 19 min. No hepatic retention of tracer. Subjectively normal emptying of tracer from gallbladder following fatty meal stimulation. Calculated gallbladder ejection fraction is 66%, normal. Normal gallbladder ejection fraction following Ensure ingestion is greater than 33% at 1 hour. IMPRESSION: Patent biliary tree with normal  gallbladder ejection fraction of 66% following fatty meal stimulation. Electronically Signed   By: MLavonia DanaM.D.   On: 05/22/2019 12:06   DG Chest Port 1 View  Result Date: 05/19/2019 CLINICAL DATA:  Shortness of breath EXAM: PORTABLE CHEST 1 VIEW COMPARISON:  05/17/2019 FINDINGS: Cardiomegaly, vascular congestion. Prior CABG. Lingular atelectasis. No confluent opacities or effusions. No acute bony abnormality. IMPRESSION: Cardiomegaly with vascular congestion. Lingular atelectasis. Electronically Signed   By: KRolm BaptiseM.D.   On: 05/19/2019 21:05   MR ABDOMEN MRCP W WO CONTAST  Result Date: 05/21/2019 CLINICAL DATA:  Cirrhosis and portal hypertension. Dilated common bile duct on recent CT scan. EXAM: MRI ABDOMEN WITHOUT AND WITH CONTRAST (INCLUDING MRCP) TECHNIQUE: Multiplanar multisequence MR imaging of the abdomen was performed both before and after the administration of intravenous contrast. Heavily T2-weighted images of the biliary and pancreatic ducts were obtained, and three-dimensional MRCP images were rendered by post processing. CONTRAST:  943mGADAVIST GADOBUTROL 1 MMOL/ML IV SOLN COMPARISON:  CT 05/19/2019. MRI 07/18/2016 FINDINGS: Markedly motion degraded study. Lower chest: Atelectasis noted in the dependent lung bases. Hepatobiliary: Nodular contour with enlargement of the lateral segment left liver suggests cirrhosis. No evidence for  arterial phase hyperintense mint within the liver parenchyma although marked motion artifact could obscure small or subtle lesions. Potential tiny layering gallstones although not a definite finding due to motion artifact. Trace intrahepatic biliary duct prominence evident. Extrahepatic common duct measures 9 mm diameter in the head of the pancreas, mildly increased. No features of choledocholithiasis on the current study. Pancreas:  6 mm cystic focus identified in the tail of pancreas Spleen:  17.3 cm craniocaudal length. No focal abnormality.  Adrenals/Urinary Tract: No adrenal nodule or mass. Kidneys unremarkable. Stomach/Bowel: Stomach is unremarkable. No gastric wall thickening. No evidence of outlet obstruction. No small bowel or colonic dilatation within the visualized abdomen. Vascular/Lymphatic: No abdominal aortic aneurysm. The portal vein, superior mesenteric vein, and splenic vein appear patent. Extensive vascular collateralization noted left abdomen with paraesophageal varices. Recanalization of the paraumbilical vein evident. There is no gastrohepatic or hepatoduodenal ligament lymphadenopathy. No retroperitoneal or mesenteric lymphadenopathy. Other:  No intraperitoneal free fluid. Musculoskeletal: Paraumbilical ventral hernia has been incompletely visualized but was noted on previous CT to contain venous collaterals. No suspicious abnormal marrow enhancement within the visualized bony anatomy. IMPRESSION: 1. Markedly motion degraded study. 2. Morphologic features of the liver compatible with cirrhosis. No evidence for arterial phase hyperenhancement to suggest hepatoma although tiny or subtle lesions could be obscured by the artifact. 3. Evidence of portal venous hypertension with splenomegaly, paraesophageal varices and recanalization of the paraumbilical vein. 4. Mild intra and extrahepatic biliary duct dilatation without findings of choledocholithiasis. 5. 6 mm cystic focus in the tail of the pancreas. Repeat MRI in 2 years recommended. This recommendation follows ACR consensus guidelines: Management of Incidental Pancreatic Cysts: A White Paper of the ACR Incidental Findings Committee. J Am Coll Radiol 4920;10:071-219. 6. Paraumbilical midline ventral hernia contains venous collaterals. Electronically Signed   By: Misty Stanley M.D.   On: 05/21/2019 10:04   CT ANGIO CHEST AORTA W/CM & OR WO/CM  Result Date: 05/17/2019 CLINICAL DATA:  Chest pain radiating to back EXAM: CT ANGIOGRAPHY CHEST WITH CONTRAST TECHNIQUE: Multidetector CT  imaging of the chest was performed using the standard protocol during bolus administration of intravenous contrast. Multiplanar CT image reconstructions and MIPs were obtained to evaluate the vascular anatomy. CONTRAST:  30m OMNIPAQUE IOHEXOL 350 MG/ML SOLN COMPARISON:  03/25/2008 FINDINGS: Cardiovascular: Cardiomegaly. Prior CABG. Diffuse aortic atherosclerosis. No aneurysm or dissection. No central pulmonary embolus. Mediastinum/Nodes: No mediastinal, hilar, or axillary adenopathy. Trachea and esophagus are unremarkable. Thyroid unremarkable. Lungs/Pleura: Areas of scarring in the lungs bilaterally. No confluent airspace opacities or effusions. Upper Abdomen: Nodular surface of the liver compatible with cirrhosis. Associated splenomegaly, partially imaged. Upper abdominal and and lower esophageal varices noted. Musculoskeletal: Chest wall soft tissues are unremarkable. No acute bony abnormality. Review of the MIP images confirms the above findings. IMPRESSION: Diffuse aortic atherosclerosis.  No aneurysm or dissection. Cardiomegaly, prior CABG. Areas of scarring in the lungs. No acute cardiopulmonary disease. Changes of cirrhosis with upper abdominal varices and splenomegaly. Electronically Signed   By: KRolm BaptiseM.D.   On: 05/17/2019 21:26   ECHOCARDIOGRAM COMPLETE  Result Date: 05/20/2019    ECHOCARDIOGRAM REPORT   Patient Name:   Samantha RADICKDate of Exam: 05/20/2019 Medical Rec #:  [id number]     Height:       63.0 in Accession #:    [id number]    Weight:       190.2 lb Date of Birth:  [birth year]     BSA:  1.893 m Patient Age:    69 years       BP:           116/47 mmHg Patient Gender: F              HR:           75 bpm. Exam Location:  [facility] Procedure: 2D Echo Indications:    Chest Pain 786.50 / R07.9                 Congestive Heart Failure 428.0 / I50.9  History:        Patient has prior history of Echocardiogram examinations, most                 recent 11/15/2017. CHF, CAD, Prior  CABG, Arrythmias:Atrial                 Fibrillation; Risk Factors:Hypertension, Dyslipidemia and Former                 Smoker.  Sonographer:    Leavy Cella RDCS (AE) Referring Phys: [id number] Demontez Novack MANUEL Ritchie  1. Left ventricular ejection fraction, by estimation, is 50 to 55%. The left ventricle has low normal function. The left ventricle has no regional wall motion abnormalities. There is mild left ventricular hypertrophy. Left ventricular diastolic parameters are indeterminate.  2. Right ventricular systolic function is low normal. The right ventricular size is normal. There is normal pulmonary artery systolic pressure. The estimated right ventricular systolic pressure is 21.1 mmHg.  3. Left atrial size was moderately dilated.  4. Right atrial size was mildly dilated.  5. The mitral valve is grossly normal. Mild mitral valve regurgitation.  6. Tricuspid valve regurgitation is mild to moderate.  7. The aortic valve is tricuspid. Aortic valve regurgitation is not visualized.  8. The inferior vena cava is normal in size with greater than 50% respiratory variability, suggesting right atrial pressure of 3 mmHg. FINDINGS  Left Ventricle: Left ventricular ejection fraction, by estimation, is 50 to 55%. The left ventricle has low normal function. The left ventricle has no regional wall motion abnormalities. The left ventricular internal cavity size was normal in size. There is mild left ventricular hypertrophy. Left ventricular diastolic parameters are indeterminate. Right Ventricle: The right ventricular size is normal. No increase in right ventricular wall thickness. Right ventricular systolic function is low normal. There is normal pulmonary artery systolic pressure. The tricuspid regurgitant velocity is 2.46 m/s,  and with an assumed right atrial pressure of 3 mmHg, the estimated right ventricular systolic pressure is 94.1 mmHg. Left Atrium: Left atrial size was moderately dilated. Right Atrium:  Right atrial size was mildly dilated. Pericardium: There is no evidence of pericardial effusion. Mitral Valve: The mitral valve is grossly normal. Mild mitral valve regurgitation. Tricuspid Valve: The tricuspid valve is grossly normal. Tricuspid valve regurgitation is mild to moderate. Aortic Valve: The aortic valve is tricuspid. Aortic valve regurgitation is not visualized. Mild aortic valve annular calcification. Pulmonic Valve: The pulmonic valve was grossly normal. Pulmonic valve regurgitation is trivial. Aorta: The aortic root is normal in size and structure. Venous: The inferior vena cava is normal in size with greater than 50% respiratory variability, suggesting right atrial pressure of 3 mmHg. IAS/Shunts: No atrial level shunt detected by color flow Doppler.  LEFT VENTRICLE PLAX 2D LVIDd:         4.70 cm  Diastology LVIDs:         3.41 cm  LV  e' medial:   5.44 cm/s LV PW:         1.20 cm  LV E/e' medial: 25.6 LV IVS:        1.09 cm LVOT diam:     1.90 cm LV SV:         68 LV SV Index:   36 LVOT Area:     2.84 cm  RIGHT VENTRICLE RV S prime:     6.98 cm/s TAPSE (M-mode): 1.9 cm LEFT ATRIUM             Index LA diam:        5.10 cm 2.69 cm/m LA Vol (A2C):   89.7 ml 47.39 ml/m LA Vol (A4C):   73.8 ml 38.99 ml/m LA Biplane Vol: 81.9 ml 43.27 ml/m  AORTIC VALVE LVOT Vmax:   95.00 cm/s LVOT Vmean:  67.400 cm/s LVOT VTI:    0.241 m  AORTA Ao Root diam: 2.40 cm MITRAL VALVE                TRICUSPID VALVE MV Area (PHT): 3.31 cm     TR Peak grad:   24.2 mmHg MV Decel Time: 229 msec     TR Vmax:        246.00 cm/s MV E velocity: 139.00 cm/s MV A velocity: 102.00 cm/s  SHUNTS MV E/A ratio:  1.36         Systemic VTI:  0.24 m                             Systemic Diam: 1.90 cm Rozann Lesches MD Electronically signed by Rozann Lesches MD Signature Date/Time: 05/20/2019/2:36:36 PM    Final    CT Angio Abd/Pel w/ and/or w/o  Result Date: 05/23/2019 CLINICAL DATA:  Epigastric pain. 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 EXAM: CTA ABDOMEN AND PELVIS WITHOUT AND WITH CONTRAST TECHNIQUE: Multidetector CT imaging of the abdomen and pelvis was performed using the standard protocol during bolus administration of intravenous contrast. Multiplanar reconstructed images and MIPs were obtained and reviewed to evaluate the vascular anatomy. CONTRAST:  130m OMNIPAQUE IOHEXOL 350 MG/ML SOLN COMPARISON:  May 19, 2018 FINDINGS: VASCULAR Aorta: There are advanced atherosclerotic changes throughout the abdominal aorta. There is no evidence for an abdominal aortic aneurysm. There is a high-grade stenosis of the distal abdominal aorta proximal to the aortic bifurcation. Celiac: Patent without evidence of aneurysm, dissection, vasculitis or significant stenosis. There is conventional hepatic arterial anatomy. SMA: There is mild atherosclerotic disease at the origin of the SMA without evidence for high-grade stenosis. Renals: The right renal artery is patent. There are 2 left renal arteries, both of which are patent. IMA: There is moderate narrowing at the origin of the IMA without evidence for high-grade stenosis. Inflow: There is high-grade narrowing at the origin of the bilateral common iliac arteries. There is moderate narrowing at the distal left common iliac artery. There is a high-grade stenosis at the origin of the left internal iliac artery. The bilateral external iliac arteries are patent. Proximal Outflow: Bilateral common femoral and visualized portions of the superficial and profunda femoral arteries are patent without evidence of aneurysm, dissection, vasculitis or significant stenosis. Veins: No obvious venous abnormality within the limitations of this arterial phase study. There is recanalization of the umbilical vein. 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 Review of the MIP images confirms  the above findings. NON-VASCULAR Lower chest: There is  atelectasis at the lung bases bilaterally.There is a trace left-sided pleural effusion. There is significant cardiomegaly. Hepatobiliary: Again identified are findings consistent with cirrhosis. There is recanalization of the umbilical vein consistent with portal hypertension. There is no discrete hepatic mass. The gallbladder is decompressed.There is no biliary ductal dilation. Pancreas: Normal contours without ductal dilatation. No peripancreatic fluid collection. Spleen: The spleen is significantly enlarged. Adrenals/Urinary Tract: --Adrenal glands: No adrenal hemorrhage. --Right kidney/ureter: No hydronephrosis or perinephric hematoma. --Left kidney/ureter: No hydronephrosis or perinephric hematoma. --Urinary bladder: Unremarkable. Stomach/Bowel: --Stomach/Duodenum: No hiatal hernia or other gastric abnormality. Normal duodenal course and caliber. --Small bowel: No dilatation or inflammation. --Colon: Rectosigmoid diverticulosis without acute inflammation. --Appendix: Normal. Lymphatic: --No retroperitoneal lymphadenopathy. --No mesenteric lymphadenopathy. --No pelvic or inguinal lymphadenopathy. Reproductive: Unremarkable Other: No ascites or free air. The abdominal wall is normal. Musculoskeletal. The patient is status post prior vertebral augmentation of the L1 vertebral body. There are interbody spacers at the L4-L5 and L5-S1 levels. The patient is status post total hip arthroplasty on the right. There are end-stage degenerative changes of the left femoroacetabular joint. IMPRESSION: 1. No evidence for significant stenosis involving the mesenteric vasculature. 2. There is a high-grade stenosis of the distal abdominal aorta, proximal to the aortic bifurcation. There is a high-grade stenosis involving the bilateral proximal common iliac arteries. 3. 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 4. Significant cardiomegaly. 5. Bibasilar atelectasis with a trace left-sided pleural effusion. 6. Additional chronic findings as detailed above. 7.  Aortic Atherosclerosis (ICD10-I70.0). Electronically Signed   By: Constance Holster M.D.   On: 05/23/2019 22:44     Microbiology: Recent Results (from the past 240 hour(s))  SARS CORONAVIRUS 2 (Chelise Hanger 6-24 HRS) Nasopharyngeal Nasopharyngeal Swab     Status: None   Collection Time: 05/15/19  6:48 AM   Specimen: Nasopharyngeal Swab  Result Value Ref Range Status   SARS Coronavirus 2 NEGATIVE NEGATIVE Final    Comment: (NOTE) SARS-CoV-2 target nucleic acids are NOT DETECTED. The SARS-CoV-2 RNA is generally detectable in upper and lower respiratory specimens during the acute phase of infection. Negative results do not preclude SARS-CoV-2 infection, do not rule out co-infections with other pathogens, and should not be used as the sole basis for treatment or other patient management decisions. Negative results must be combined with clinical observations, patient history, and epidemiological information. The expected result is Negative. Fact Sheet for Patients: SugarRoll.be Fact Sheet for Healthcare Providers: https://www.woods-mathews.com/ This test is not yet approved or cleared by the [place] FDA and  has been authorized for detection and/or diagnosis of SARS-CoV-2 by FDA under an Emergency Use Authorization (EUA). This EUA will remain  in effect (meaning this test can be used) for the duration of the COVID-19 declaration under Section 56 4(b)(1) of the Act, 21 U.S.C. section 360bbb-3(b)(1), unless the authorization is terminated or revoked sooner. Performed at [facility], [place] [address], [place], [place] [address]      Labs: Basic Metabolic Panel: Recent Labs  Lab 05/17/19 1740  05/17/19 1740 05/19/19 1849 05/19/19 1849 05/20/19 0505 05/20/19 0505 05/23/19 0550 05/24/19 0535  NA 141  --  139  --  140  --  141 142  K 3.9   < > 3.6   < >  3.7   < > 3.5 3.5  CL 104  --  102  --  105  --  104 101  CO2 29  --  28  --  28  --  28 32  GLUCOSE 154*  --  129*  --  95  --  89 91  BUN 13  --  16  --  16  --  14 13  CREATININE 1.42*  --  1.43*  --  1.31*  --  1.01* 0.99  CALCIUM 8.9  --  8.6*  --  8.1*  --  8.5* 8.6*  MG  --   --  1.8  --   --   --   --  1.6*  PHOS  --   --  2.7  --   --   --   --   --    < > = values in this interval not displayed.   Liver Function Tests: Recent Labs  Lab 05/19/19 1849 05/20/19 0505 05/21/19 0640 05/22/19 0514 05/23/19 0550  AST 51* 41 40 38 33  ALT 17 16 15 15 12   ALKPHOS 81 74 65 63 61  BILITOT 2.1* 1.4* 2.1* 2.2* 1.9*  PROT 7.1 6.3* 6.6 6.5 6.2*  ALBUMIN 3.2* 2.7* 2.8* 2.8* 2.7*   Recent Labs  Lab 05/17/19 1740 05/19/19 1849 05/21/19 0640  LIPASE 37 46 24   No results for input(s): AMMONIA in the last 168 hours. CBC: Recent Labs  Lab 05/17/19 1740 05/17/19 1740 05/19/19 1849 05/20/19 0505 05/21/19 0640 05/23/19 0550 05/24/19 0535  WBC 5.1   < > 5.6 2.6* 3.2* 2.0* 2.2*  NEUTROABS 3.6  --  3.9 1.3* 2.0  --   --   HGB 12.1   < > 12.6 10.9* 11.2* 11.3* 11.4*  HCT 39.7   < > 40.3 35.8* 36.0 37.2 36.5  MCV 97.3   < > 94.8 95.7 95.2 96.9 94.6  PLT 55*   < > 64* 46* 46* 49* 44*   < > = values in this interval not displayed.   Cardiac Enzymes: No results for input(s): CKTOTAL, CKMB, CKMBINDEX, TROPONINI in the last 168 hours. BNP: Invalid input(s): POCBNP CBG: No results for input(s): GLUCAP in the last 168 hours.  Time coordinating discharge:  36 minutes  Signed:  Orson Eva, DO [facility] Pager: [phone] 05/24/2019, 12:29 PM

## 2019-05-24 NOTE — Care Management Important Message (Signed)
Important Message  Patient Details  Name: Samantha [name] MRN: [id number] Date of Birth: 30-Oct-1950   Medicare Important Message Given:  Yes     Tommy Medal 05/24/2019, 1:14 PM

## 2019-05-26 DIAGNOSIS — N183 Chronic kidney disease, stage 3 unspecified: Secondary | ICD-10-CM | POA: Diagnosis not present

## 2019-05-26 DIAGNOSIS — J9601 Acute respiratory failure with hypoxia: Secondary | ICD-10-CM | POA: Diagnosis not present

## 2019-05-26 DIAGNOSIS — I2510 Atherosclerotic heart disease of native coronary artery without angina pectoris: Secondary | ICD-10-CM | POA: Diagnosis not present

## 2019-05-26 DIAGNOSIS — I130 Hypertensive heart and chronic kidney disease with heart failure and stage 1 through stage 4 chronic kidney disease, or unspecified chronic kidney disease: Secondary | ICD-10-CM | POA: Diagnosis not present

## 2019-05-26 DIAGNOSIS — R0789 Other chest pain: Secondary | ICD-10-CM | POA: Diagnosis not present

## 2019-05-26 DIAGNOSIS — K7581 Nonalcoholic steatohepatitis (NASH): Secondary | ICD-10-CM | POA: Diagnosis not present

## 2019-05-26 DIAGNOSIS — I4819 Other persistent atrial fibrillation: Secondary | ICD-10-CM | POA: Diagnosis not present

## 2019-05-26 DIAGNOSIS — I8500 Esophageal varices without bleeding: Secondary | ICD-10-CM | POA: Diagnosis not present

## 2019-05-26 DIAGNOSIS — E782 Mixed hyperlipidemia: Secondary | ICD-10-CM | POA: Diagnosis not present

## 2019-05-26 DIAGNOSIS — K7469 Other cirrhosis of liver: Secondary | ICD-10-CM | POA: Diagnosis not present

## 2019-05-26 DIAGNOSIS — I5043 Acute on chronic combined systolic (congestive) and diastolic (congestive) heart failure: Secondary | ICD-10-CM | POA: Diagnosis not present

## 2019-05-26 DIAGNOSIS — N179 Acute kidney failure, unspecified: Secondary | ICD-10-CM | POA: Diagnosis not present

## 2019-05-27 ENCOUNTER — Other Ambulatory Visit: Payer: Self-pay

## 2019-05-27 ENCOUNTER — Other Ambulatory Visit: Payer: Self-pay | Admitting: *Deleted

## 2019-05-27 DIAGNOSIS — I5043 Acute on chronic combined systolic (congestive) and diastolic (congestive) heart failure: Secondary | ICD-10-CM

## 2019-05-27 NOTE — Patient Outreach (Signed)
[facility]) Care Management  05/27/2019  Samantha [name] 09/14/1950 [id number]   RN Health CoachDiscipline Closure  Referral Date:08/20/2018 Referral Source:[employer] High Risk List Reason for Referral:Assess Needs Insurance:[employer] Medicare   Outreach Attempt:  Patient discharged from [facility] on 05/24/2019.  [facility] Liaison is placing referral for [name] Coordinator for discharge follow up.  Plan:  RN Health Coach will close Disease Management Case.  RN Health Coach will send primary care provider Discipline Closure Letter.  [place] [phone] [email or web address]

## 2019-05-28 ENCOUNTER — Other Ambulatory Visit: Payer: Self-pay | Admitting: *Deleted

## 2019-05-28 NOTE — Patient Outreach (Signed)
Telephone assessment for complex care management post acute care.  No answer, left message and requested return call.  Samantha [name]. Myrtie Neither, MSN, [facility] Gerontological Nurse Practitioner [facility] Care Management [phone]

## 2019-05-29 DIAGNOSIS — I10 Essential (primary) hypertension: Secondary | ICD-10-CM | POA: Diagnosis not present

## 2019-05-29 NOTE — H&P (Signed)
Surgical History & Physical  Patient Name: Samantha [name] DOB: Mar 11, 1950  Surgery: Cataract extraction with intraocular lens implant phacoemulsification; Left Eye  Surgeon: Baruch Goldmann MD Surgery Date:  06/10/2019 Pre-Op Date:  05/27/2019  HPI: A 17 Yr. old female patient 1. 1. The patient complains of difficulty when seeing street signs, which began 1 year ago. The left eye is affected. The episode is gradual. The condition's severity increased since last visit. Symptoms occur when the patient is driving, inside and outside. This is negatively affecting the patient's quality of life. 2. The patient is returning after cataract post-op. The right eye is affected. Status post cataract post-op, which began 1 week ago: Since the last visit, the affected area is doing well. The patient's vision is improved. Patient is following medication instructions. Pt c/o a film over her right eye. HPI Completed by Dr. Baruch Goldmann  Medical History: Cataracts Depression Heart Problem High Blood Pressure  Review of Systems Negative Allergic/Immunologic Negative Cardiovascular Negative Constitutional Negative Ear, Nose, Mouth & Throat Negative Endocrine Negative Eyes Negative Gastrointestinal Negative Genitourinary Negative Hemotologic/Lymphatic Negative Integumentary Negative Musculoskeletal Negative Neurological Negative Psychiatry Negative Respiratory  Social   Never Smoked  Medication Prednisolone-gatiflox-bromfenac,  Fluoxetine, Valsartan, Pantoprazole, Furosemide, Metoprolol, Ezetimibe, Isosorbide Dinitrate,   Sx/Procedures Phaco c IOL OD,  Heart Bypass, Hip Replacement, Ankle Surgery,   Drug Allergies  Penicilin, Ceclor, Keflex,   History & Physical: Heent:  Cataract, left eye NECK: supple without bruits LUNGS: lungs clear to auscultation CV: regular rate and rhythm Abdomen: soft and non-tender  Impression & Plan: Assessment: 1.  CATARACT EXTRACTION STATUS; Right Eye  (Z98.41) 2.  NUCLEAR SCLEROSIS AGE RELATED; , Left Eye (H25.12) 3.  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  1 week after cataract surgery. Doing well with improved vision and normal eye pressure. Call with any problems or concerns. Continue Gati-Brom-Pred 2x/day for 3 more weeks. 2.  Dilates well - shugarcaine by protocol. Cataract accounts for the patient's decreased vision. This visual impairment is not correctable with a tolerable change in glasses or contact lenses. Cataract surgery with an implantation of a new lens should significantly improve the visual and functional status of the patient. Discussed all risks, benefits, alternatives, and potential complications. Discussed the procedures and recovery. Patient desires to have surgery. A-scan ordered and performed today for intra-ocular lens calculations. The surgery will be performed in order to improve vision for driving, reading, and for eye examinations. Recommend phacoemulsification with intra-ocular lens. Left Eye. Surgery required to correct imbalance of vision. 3.  Declines Toric Lens.

## 2019-05-30 ENCOUNTER — Ambulatory Visit: Payer: Self-pay | Admitting: *Deleted

## 2019-05-30 DIAGNOSIS — I10 Essential (primary) hypertension: Secondary | ICD-10-CM | POA: Diagnosis not present

## 2019-05-30 DIAGNOSIS — J441 Chronic obstructive pulmonary disease with (acute) exacerbation: Secondary | ICD-10-CM | POA: Diagnosis not present

## 2019-05-30 DIAGNOSIS — R1013 Epigastric pain: Secondary | ICD-10-CM | POA: Diagnosis not present

## 2019-05-30 DIAGNOSIS — N183 Chronic kidney disease, stage 3 unspecified: Secondary | ICD-10-CM | POA: Diagnosis not present

## 2019-05-30 DIAGNOSIS — I5022 Chronic systolic (congestive) heart failure: Secondary | ICD-10-CM | POA: Diagnosis not present

## 2019-05-31 ENCOUNTER — Other Ambulatory Visit: Payer: Self-pay | Admitting: *Deleted

## 2019-05-31 ENCOUNTER — Encounter: Payer: Self-pay | Admitting: *Deleted

## 2019-05-31 DIAGNOSIS — K7469 Other cirrhosis of liver: Secondary | ICD-10-CM | POA: Diagnosis not present

## 2019-05-31 DIAGNOSIS — I130 Hypertensive heart and chronic kidney disease with heart failure and stage 1 through stage 4 chronic kidney disease, or unspecified chronic kidney disease: Secondary | ICD-10-CM | POA: Diagnosis not present

## 2019-05-31 DIAGNOSIS — R0789 Other chest pain: Secondary | ICD-10-CM | POA: Diagnosis not present

## 2019-05-31 DIAGNOSIS — N183 Chronic kidney disease, stage 3 unspecified: Secondary | ICD-10-CM | POA: Diagnosis not present

## 2019-05-31 DIAGNOSIS — I4819 Other persistent atrial fibrillation: Secondary | ICD-10-CM | POA: Diagnosis not present

## 2019-05-31 DIAGNOSIS — I2510 Atherosclerotic heart disease of native coronary artery without angina pectoris: Secondary | ICD-10-CM | POA: Diagnosis not present

## 2019-05-31 DIAGNOSIS — J9601 Acute respiratory failure with hypoxia: Secondary | ICD-10-CM | POA: Diagnosis not present

## 2019-05-31 DIAGNOSIS — K7581 Nonalcoholic steatohepatitis (NASH): Secondary | ICD-10-CM | POA: Diagnosis not present

## 2019-05-31 DIAGNOSIS — I8500 Esophageal varices without bleeding: Secondary | ICD-10-CM | POA: Diagnosis not present

## 2019-05-31 DIAGNOSIS — N179 Acute kidney failure, unspecified: Secondary | ICD-10-CM | POA: Diagnosis not present

## 2019-05-31 DIAGNOSIS — E782 Mixed hyperlipidemia: Secondary | ICD-10-CM | POA: Diagnosis not present

## 2019-05-31 DIAGNOSIS — I5043 Acute on chronic combined systolic (congestive) and diastolic (congestive) heart failure: Secondary | ICD-10-CM | POA: Diagnosis not present

## 2019-05-31 NOTE — Patient Outreach (Signed)
Second unsuccessful telephone outreach for [facility] care management.  I will send a letter and call pt in 10 days if I have not heard from her.  Samantha [name]. Myrtie Neither, MSN, [facility] Gerontological Nurse Practitioner [facility] Care Management [phone]

## 2019-06-03 DIAGNOSIS — H2512 Age-related nuclear cataract, left eye: Secondary | ICD-10-CM | POA: Diagnosis not present

## 2019-06-04 DIAGNOSIS — N183 Chronic kidney disease, stage 3 unspecified: Secondary | ICD-10-CM | POA: Diagnosis not present

## 2019-06-04 DIAGNOSIS — I8500 Esophageal varices without bleeding: Secondary | ICD-10-CM | POA: Diagnosis not present

## 2019-06-04 DIAGNOSIS — E782 Mixed hyperlipidemia: Secondary | ICD-10-CM | POA: Diagnosis not present

## 2019-06-04 DIAGNOSIS — I130 Hypertensive heart and chronic kidney disease with heart failure and stage 1 through stage 4 chronic kidney disease, or unspecified chronic kidney disease: Secondary | ICD-10-CM | POA: Diagnosis not present

## 2019-06-04 DIAGNOSIS — I4819 Other persistent atrial fibrillation: Secondary | ICD-10-CM | POA: Diagnosis not present

## 2019-06-04 DIAGNOSIS — N179 Acute kidney failure, unspecified: Secondary | ICD-10-CM | POA: Diagnosis not present

## 2019-06-04 DIAGNOSIS — I5043 Acute on chronic combined systolic (congestive) and diastolic (congestive) heart failure: Secondary | ICD-10-CM | POA: Diagnosis not present

## 2019-06-04 DIAGNOSIS — K7469 Other cirrhosis of liver: Secondary | ICD-10-CM | POA: Diagnosis not present

## 2019-06-04 DIAGNOSIS — R0789 Other chest pain: Secondary | ICD-10-CM | POA: Diagnosis not present

## 2019-06-04 DIAGNOSIS — J9601 Acute respiratory failure with hypoxia: Secondary | ICD-10-CM | POA: Diagnosis not present

## 2019-06-04 DIAGNOSIS — K7581 Nonalcoholic steatohepatitis (NASH): Secondary | ICD-10-CM | POA: Diagnosis not present

## 2019-06-04 DIAGNOSIS — I2510 Atherosclerotic heart disease of native coronary artery without angina pectoris: Secondary | ICD-10-CM | POA: Diagnosis not present

## 2019-06-05 ENCOUNTER — Encounter (HOSPITAL_COMMUNITY)
Admission: RE | Admit: 2019-06-05 | Discharge: 2019-06-05 | Disposition: A | Discharge status: Home / Self Care | Payer: Medicare Other | Source: Ambulatory Visit | Attending: Ophthalmology | Admitting: Ophthalmology

## 2019-06-05 ENCOUNTER — Encounter (HOSPITAL_COMMUNITY): Payer: Self-pay

## 2019-06-05 ENCOUNTER — Other Ambulatory Visit: Payer: Self-pay

## 2019-06-06 DIAGNOSIS — I2510 Atherosclerotic heart disease of native coronary artery without angina pectoris: Secondary | ICD-10-CM | POA: Diagnosis not present

## 2019-06-06 DIAGNOSIS — I8500 Esophageal varices without bleeding: Secondary | ICD-10-CM | POA: Diagnosis not present

## 2019-06-06 DIAGNOSIS — N179 Acute kidney failure, unspecified: Secondary | ICD-10-CM | POA: Diagnosis not present

## 2019-06-06 DIAGNOSIS — I130 Hypertensive heart and chronic kidney disease with heart failure and stage 1 through stage 4 chronic kidney disease, or unspecified chronic kidney disease: Secondary | ICD-10-CM | POA: Diagnosis not present

## 2019-06-06 DIAGNOSIS — E782 Mixed hyperlipidemia: Secondary | ICD-10-CM | POA: Diagnosis not present

## 2019-06-06 DIAGNOSIS — I4819 Other persistent atrial fibrillation: Secondary | ICD-10-CM | POA: Diagnosis not present

## 2019-06-06 DIAGNOSIS — K7581 Nonalcoholic steatohepatitis (NASH): Secondary | ICD-10-CM | POA: Diagnosis not present

## 2019-06-06 DIAGNOSIS — I5043 Acute on chronic combined systolic (congestive) and diastolic (congestive) heart failure: Secondary | ICD-10-CM | POA: Diagnosis not present

## 2019-06-06 DIAGNOSIS — R0789 Other chest pain: Secondary | ICD-10-CM | POA: Diagnosis not present

## 2019-06-06 DIAGNOSIS — N183 Chronic kidney disease, stage 3 unspecified: Secondary | ICD-10-CM | POA: Diagnosis not present

## 2019-06-06 DIAGNOSIS — K7469 Other cirrhosis of liver: Secondary | ICD-10-CM | POA: Diagnosis not present

## 2019-06-06 DIAGNOSIS — J9601 Acute respiratory failure with hypoxia: Secondary | ICD-10-CM | POA: Diagnosis not present

## 2019-06-07 ENCOUNTER — Other Ambulatory Visit: Payer: Self-pay

## 2019-06-07 ENCOUNTER — Other Ambulatory Visit (HOSPITAL_COMMUNITY)
Admission: RE | Admit: 2019-06-07 | Discharge: 2019-06-07 | Disposition: A | Discharge status: Home / Self Care | Payer: Medicare Other | Source: Ambulatory Visit | Attending: Ophthalmology | Admitting: Ophthalmology

## 2019-06-07 DIAGNOSIS — Z01812 Encounter for preprocedural laboratory examination: Secondary | ICD-10-CM | POA: Diagnosis not present

## 2019-06-07 DIAGNOSIS — Z20822 Contact with and (suspected) exposure to covid-19: Secondary | ICD-10-CM | POA: Diagnosis not present

## 2019-06-08 LAB — SARS CORONAVIRUS 2 (TAT 6-24 HRS): SARS Coronavirus 2: NEGATIVE

## 2019-06-10 ENCOUNTER — Encounter (HOSPITAL_COMMUNITY)
Admission: RE | Disposition: A | Discharge status: Home / Self Care | Payer: Self-pay | Source: Home / Self Care | Attending: Ophthalmology

## 2019-06-10 ENCOUNTER — Encounter (HOSPITAL_COMMUNITY): Payer: Self-pay | Admitting: Ophthalmology

## 2019-06-10 ENCOUNTER — Ambulatory Visit (HOSPITAL_COMMUNITY): Payer: Medicare Other | Admitting: Anesthesiology

## 2019-06-10 ENCOUNTER — Ambulatory Visit (HOSPITAL_COMMUNITY)
Admission: RE | Admit: 2019-06-10 | Discharge: 2019-06-10 | Disposition: A | Discharge status: Home / Self Care | Payer: Medicare Other | Attending: Ophthalmology | Admitting: Ophthalmology

## 2019-06-10 DIAGNOSIS — I252 Old myocardial infarction: Secondary | ICD-10-CM | POA: Insufficient documentation

## 2019-06-10 DIAGNOSIS — Z9841 Cataract extraction status, right eye: Secondary | ICD-10-CM | POA: Insufficient documentation

## 2019-06-10 DIAGNOSIS — D696 Thrombocytopenia, unspecified: Secondary | ICD-10-CM | POA: Insufficient documentation

## 2019-06-10 DIAGNOSIS — F329 Major depressive disorder, single episode, unspecified: Secondary | ICD-10-CM | POA: Diagnosis not present

## 2019-06-10 DIAGNOSIS — Z955 Presence of coronary angioplasty implant and graft: Secondary | ICD-10-CM | POA: Insufficient documentation

## 2019-06-10 DIAGNOSIS — J449 Chronic obstructive pulmonary disease, unspecified: Secondary | ICD-10-CM | POA: Diagnosis not present

## 2019-06-10 DIAGNOSIS — Z87891 Personal history of nicotine dependence: Secondary | ICD-10-CM | POA: Insufficient documentation

## 2019-06-10 DIAGNOSIS — Z79899 Other long term (current) drug therapy: Secondary | ICD-10-CM | POA: Insufficient documentation

## 2019-06-10 DIAGNOSIS — K219 Gastro-esophageal reflux disease without esophagitis: Secondary | ICD-10-CM | POA: Insufficient documentation

## 2019-06-10 DIAGNOSIS — Z961 Presence of intraocular lens: Secondary | ICD-10-CM | POA: Insufficient documentation

## 2019-06-10 DIAGNOSIS — I2510 Atherosclerotic heart disease of native coronary artery without angina pectoris: Secondary | ICD-10-CM | POA: Insufficient documentation

## 2019-06-10 DIAGNOSIS — F419 Anxiety disorder, unspecified: Secondary | ICD-10-CM | POA: Diagnosis not present

## 2019-06-10 DIAGNOSIS — Z881 Allergy status to other antibiotic agents status: Secondary | ICD-10-CM | POA: Insufficient documentation

## 2019-06-10 DIAGNOSIS — H52222 Regular astigmatism, left eye: Secondary | ICD-10-CM | POA: Insufficient documentation

## 2019-06-10 DIAGNOSIS — Z951 Presence of aortocoronary bypass graft: Secondary | ICD-10-CM | POA: Diagnosis not present

## 2019-06-10 DIAGNOSIS — H2512 Age-related nuclear cataract, left eye: Secondary | ICD-10-CM | POA: Insufficient documentation

## 2019-06-10 DIAGNOSIS — I110 Hypertensive heart disease with heart failure: Secondary | ICD-10-CM | POA: Diagnosis not present

## 2019-06-10 DIAGNOSIS — I10 Essential (primary) hypertension: Secondary | ICD-10-CM | POA: Diagnosis not present

## 2019-06-10 DIAGNOSIS — Z880 Allergy status to penicillin: Secondary | ICD-10-CM | POA: Insufficient documentation

## 2019-06-10 DIAGNOSIS — I509 Heart failure, unspecified: Secondary | ICD-10-CM | POA: Insufficient documentation

## 2019-06-10 DIAGNOSIS — Z96649 Presence of unspecified artificial hip joint: Secondary | ICD-10-CM | POA: Diagnosis not present

## 2019-06-10 DIAGNOSIS — K7460 Unspecified cirrhosis of liver: Secondary | ICD-10-CM | POA: Insufficient documentation

## 2019-06-10 HISTORY — PX: CATARACT EXTRACTION W/PHACO: SHX586

## 2019-06-10 SURGERY — PHACOEMULSIFICATION, CATARACT, WITH IOL INSERTION
Anesthesia: Monitor Anesthesia Care | Site: Eye | Laterality: Left

## 2019-06-10 MED ORDER — NEOMYCIN-POLYMYXIN-DEXAMETH 3.5-10000-0.1 OP SUSP
OPHTHALMIC | Status: DC | PRN
  Administered 2019-06-10: 1 [drp] via OPHTHALMIC

## 2019-06-10 MED ORDER — PROVISC 10 MG/ML IO SOLN
INTRAOCULAR | Status: DC | PRN
  Administered 2019-06-10: 0.85 mL via INTRAOCULAR

## 2019-06-10 MED ORDER — EPINEPHRINE PF 1 MG/ML IJ SOLN
INTRAMUSCULAR | Status: AC
  Filled 2019-06-10: qty 2

## 2019-06-10 MED ORDER — LIDOCAINE HCL 3.5 % OP GEL
1.0000 "application " | Freq: Once | OPHTHALMIC | Status: AC
  Administered 2019-06-10: 1 via OPHTHALMIC

## 2019-06-10 MED ORDER — BSS IO SOLN
INTRAOCULAR | Status: DC | PRN
  Administered 2019-06-10: 15 mL via INTRAOCULAR

## 2019-06-10 MED ORDER — SODIUM HYALURONATE 23 MG/ML IO SOLN
INTRAOCULAR | Status: DC | PRN
  Administered 2019-06-10: 0.6 mL via INTRAOCULAR

## 2019-06-10 MED ORDER — PHENYLEPHRINE HCL 2.5 % OP SOLN
1.0000 [drp] | OPHTHALMIC | Status: AC | PRN
  Administered 2019-06-10 (×3): 1 [drp] via OPHTHALMIC

## 2019-06-10 MED ORDER — MIDAZOLAM HCL 2 MG/2ML IJ SOLN
INTRAMUSCULAR | Status: DC | PRN
  Administered 2019-06-10: 1 mg via INTRAVENOUS

## 2019-06-10 MED ORDER — POVIDONE-IODINE 5 % OP SOLN
OPHTHALMIC | Status: DC | PRN
  Administered 2019-06-10: 1 via OPHTHALMIC

## 2019-06-10 MED ORDER — LIDOCAINE HCL (PF) 1 % IJ SOLN
INTRAOCULAR | Status: DC | PRN
  Administered 2019-06-10: 1 mL via OPHTHALMIC

## 2019-06-10 MED ORDER — CYCLOPENTOLATE-PHENYLEPHRINE 0.2-1 % OP SOLN
1.0000 [drp] | OPHTHALMIC | Status: AC | PRN
  Administered 2019-06-10 (×3): 1 [drp] via OPHTHALMIC

## 2019-06-10 MED ORDER — EPINEPHRINE PF 1 MG/ML IJ SOLN
INTRAOCULAR | Status: DC | PRN
  Administered 2019-06-10: 500 mL

## 2019-06-10 MED ORDER — MIDAZOLAM HCL 2 MG/2ML IJ SOLN
INTRAMUSCULAR | Status: AC
  Filled 2019-06-10: qty 2

## 2019-06-10 MED ORDER — TETRACAINE HCL 0.5 % OP SOLN
1.0000 [drp] | OPHTHALMIC | Status: AC | PRN
  Administered 2019-06-10 (×3): 1 [drp] via OPHTHALMIC

## 2019-06-10 SURGICAL SUPPLY — 12 items
CLOTH BEACON ORANGE TIMEOUT ST (SAFETY) ×1 IMPLANT
EYE SHIELD UNIVERSAL CLEAR (GAUZE/BANDAGES/DRESSINGS) ×1 IMPLANT
GLOVE BIOGEL PI IND STRL 7.0 (GLOVE) IMPLANT
GLOVE BIOGEL PI INDICATOR 7.0 (GLOVE) ×2
LENS ALC ACRYL/TECN (Ophthalmic Related) ×1 IMPLANT
NDL HYPO 18GX1.5 BLUNT FILL (NEEDLE) IMPLANT
NEEDLE HYPO 18GX1.5 BLUNT FILL (NEEDLE) ×2 IMPLANT
PAD ARMBOARD 7.5X6 YLW CONV (MISCELLANEOUS) ×1 IMPLANT
SYR TB 1ML LL NO SAFETY (SYRINGE) ×1 IMPLANT
TAPE PAPER 1X10 WHT MICROPORE (GAUZE/BANDAGES/DRESSINGS) ×1 IMPLANT
VISCOELASTIC ADDITIONAL (OPHTHALMIC RELATED) ×1 IMPLANT
WATER STERILE IRR 250ML POUR (IV SOLUTION) ×1 IMPLANT

## 2019-06-10 NOTE — Interval H&P Note (Signed)
History and Physical Interval Note: The H and P was reviewed and updated. The patient was examined.  No changes were found after exam.  The surgical eye was marked.  06/10/2019 9:58 AM  Samantha [name]  has presented today for surgery, with the diagnosis of Nuclear sclerotic cataract - Left eye.  The various methods of treatment have been discussed with the patient and family. After consideration of risks, benefits and other options for treatment, the patient has consented to  Procedure(s) with comments: CATARACT EXTRACTION PHACO AND INTRAOCULAR LENS PLACEMENT ([place]) (Left) - left - pt knows to arrive at 8:20 as a surgical intervention.  The patient's history has been reviewed, patient examined, no change in status, stable for surgery.  I have reviewed the patient's chart and labs.  Questions were answered to the patient's satisfaction.     Baruch Goldmann

## 2019-06-10 NOTE — Anesthesia Postprocedure Evaluation (Addendum)
Anesthesia Post Note  Patient: WHITTLEY CARANDANG  Procedure(s) Performed: CATARACT EXTRACTION PHACO AND INTRAOCULAR LENS PLACEMENT (IOC) (Left Eye)  Patient location during evaluation: Phase II Anesthesia Type: MAC Level of consciousness: awake and alert and oriented Pain management: pain level controlled Vital Signs Assessment: post-procedure vital signs reviewed and stable Respiratory status: spontaneous breathing and respiratory function stable Cardiovascular status: blood pressure returned to baseline and stable Postop Assessment: no apparent nausea or vomiting Anesthetic complications: no     Last Vitals:  Vitals:   06/10/19 0950 06/10/19 1033  BP: (!) 103/52 (!) 112/49  Pulse:  68  Resp:  16  Temp:  36.8 C  SpO2:  96%    Last Pain:  Vitals:   06/10/19 1033  TempSrc: Oral  PainSc: 0-No pain                 Zayvon Alicea C Kalani Sthilaire

## 2019-06-10 NOTE — Anesthesia Preprocedure Evaluation (Addendum)
Anesthesia Evaluation  Patient identified by MRN, date of birth, ID band Patient awake    Reviewed: Allergy & Precautions, NPO status , Patient's Chart, lab work & pertinent test results, reviewed documented beta blocker date and time   Airway Mallampati: II  TM Distance: >3 FB Neck ROM: Full    Dental  (+) Lower Dentures, Upper Dentures   Pulmonary shortness of breath and with exertion, COPD,  COPD inhaler, former smoker,    Pulmonary exam normal breath sounds clear to auscultation       Cardiovascular Exercise Tolerance: Poor hypertension, Pt. on medications and Pt. on home beta blockers + CAD, + Past MI, + Cardiac Stents, + CABG and +CHF   Rhythm:Irregular Rate:Normal  Impressions:   - Compared to a prior study in 04/2017, the LVEF is lower at 40-45%  with inferior and lateral wall hypokinesis, grade 2 DD and  elevated LV filling pressure.   17-Apr-2019 23:28:54 [facility]-ER ROUTINE RECORD Sinus rhythm Prolonged PR interval Consider left ventricular hypertrophy Abnormal T, consider ischemia, lateral leads Prolonged QT interval No significant change since last tracing 17 Apr 2018 Confirmed by Rolland Porter [id number]) on 04/18/2019 12:36:27 AM   Neuro/Psych PSYCHIATRIC DISORDERS Anxiety Depression  Neuromuscular disease    GI/Hepatic GERD  Medicated and Controlled,(+) Cirrhosis   Esophageal Varices    ,   Endo/Other  negative endocrine ROS  Renal/GU Renal disease     Musculoskeletal  (+) Arthritis ,   Abdominal   Peds  Hematology  (+) Blood dyscrasia (Thrombocytopenia - discussed with Dr. Linwood Dibbles), anemia ,   Anesthesia Other Findings   Reproductive/Obstetrics                            Anesthesia Physical  Anesthesia Plan  ASA: IV  Anesthesia Plan: MAC   Post-op Pain Management:    Induction:   PONV Risk Score and Plan:   Airway Management Planned: Nasal  Cannula and Natural Airway  Additional Equipment:   Intra-op Plan:   Post-operative Plan:   Informed Consent: I have reviewed the patients History and Physical, chart, labs and discussed the procedure including the risks, benefits and alternatives for the proposed anesthesia with the patient or authorized representative who has indicated his/her understanding and acceptance.       Plan Discussed with: CRNA and Surgeon  Anesthesia Plan Comments:         Anesthesia Quick Evaluation

## 2019-06-10 NOTE — Discharge Instructions (Signed)
Please discharge patient when stable, will follow up today with Dr. Rolfe Hartsell at the [facility] office immediately following discharge.  Leave shield in place until visit.  All paperwork with discharge instructions will be given at the office.  [facility] Address:  [address]  [place], [place] [address]  

## 2019-06-10 NOTE — Transfer of Care (Signed)
Immediate Anesthesia Transfer of Care Note  Patient: Samantha [name]  Procedure(s) Performed: CATARACT EXTRACTION PHACO AND INTRAOCULAR LENS PLACEMENT (I[facility]) (Left Eye)  Patient Location: Short Stay  Anesthesia Type:MAC  Level of Consciousness: awake, alert , oriented and patient cooperative  Airway & Oxygen Therapy: Patient Spontanous Breathing  Post-op Assessment: Report given to RN and Post -op Vital signs reviewed and stable  Post vital signs: Reviewed and stable  Last Vitals:  Vitals Value Taken Time  BP    Temp    Pulse    Resp    SpO2      Last Pain:  Vitals:   06/10/19 0915  TempSrc: Oral  PainSc: 0-No pain      Patients Stated Pain Goal: 1 (36/85/99 2341)  Complications: No apparent anesthesia complications

## 2019-06-10 NOTE — Op Note (Signed)
Date of procedure: 06/10/19  Pre-operative diagnosis: Visually significant age-related nuclear cataract, Left Eye (H25.12)  Post-operative diagnosis: Visually significant age-related nuclear cataract, Left Eye  Procedure: Removal of cataract via phacoemulsification and insertion of intra-ocular lens Wynetta Emery and [place]  +24.5D into the capsular bag of the Left Eye  Attending surgeon: Gerda Diss. Renard Caperton, MD, MA  Anesthesia: MAC, Topical [place]  Complications: None  Estimated Blood Loss: <41m (minimal)  Specimens: None  Implants: As above  Indications:  Visually significant age-related cataract, Left Eye  Procedure:  The patient was seen and identified in the pre-operative area. The operative eye was identified and dilated.  The operative eye was marked.  Topical anesthesia was administered to the operative eye.     The patient was then to the operative suite and placed in the supine position.  A timeout was performed confirming the patient, procedure to be performed, and all other relevant information.   The patient's face was prepped and draped in the usual fashion for intra-ocular surgery.  A lid speculum was placed into the operative eye and the surgical microscope moved into place and focused.  An inferotemporal paracentesis was created using a 20 gauge paracentesis blade.  Shugarcaine was injected into the anterior chamber.  Viscoelastic was injected into the anterior chamber.  A temporal clear-corneal main wound incision was created using a 2.462mmicrokeratome.  A continuous curvilinear capsulorrhexis was initiated using an irrigating cystitome and completed using capsulorrhexis forceps.  Hydrodissection and hydrodeliniation were performed.  Viscoelastic was injected into the anterior chamber.  A phacoemulsification handpiece and a chopper as a second instrument were used to remove the nucleus and epinucleus. The irrigation/aspiration handpiece was used to remove any remaining  cortical material.   The capsular bag was reinflated with viscoelastic, checked, and found to be intact.  The intraocular lens was inserted into the capsular bag.  The irrigation/aspiration handpiece was used to remove any remaining viscoelastic.  The clear corneal wound and paracentesis wounds were then hydrated and checked with Weck-Cels to be watertight.  The lid-speculum and drape was removed, and the patient's face was cleaned with a wet and dry 4x4.  Maxitrol was instilled in the eye before a clear shield was taped over the eye. The patient was taken to the post-operative care unit in good condition, having tolerated the procedure well.  Post-Op Instructions: [facility] and will receive all other orders and instructions.

## 2019-06-11 DIAGNOSIS — E782 Mixed hyperlipidemia: Secondary | ICD-10-CM | POA: Diagnosis not present

## 2019-06-11 DIAGNOSIS — I2510 Atherosclerotic heart disease of native coronary artery without angina pectoris: Secondary | ICD-10-CM | POA: Diagnosis not present

## 2019-06-11 DIAGNOSIS — K7581 Nonalcoholic steatohepatitis (NASH): Secondary | ICD-10-CM | POA: Diagnosis not present

## 2019-06-11 DIAGNOSIS — I8500 Esophageal varices without bleeding: Secondary | ICD-10-CM | POA: Diagnosis not present

## 2019-06-11 DIAGNOSIS — I130 Hypertensive heart and chronic kidney disease with heart failure and stage 1 through stage 4 chronic kidney disease, or unspecified chronic kidney disease: Secondary | ICD-10-CM | POA: Diagnosis not present

## 2019-06-11 DIAGNOSIS — R0789 Other chest pain: Secondary | ICD-10-CM | POA: Diagnosis not present

## 2019-06-11 DIAGNOSIS — J9601 Acute respiratory failure with hypoxia: Secondary | ICD-10-CM | POA: Diagnosis not present

## 2019-06-11 DIAGNOSIS — K7469 Other cirrhosis of liver: Secondary | ICD-10-CM | POA: Diagnosis not present

## 2019-06-11 DIAGNOSIS — I5043 Acute on chronic combined systolic (congestive) and diastolic (congestive) heart failure: Secondary | ICD-10-CM | POA: Diagnosis not present

## 2019-06-11 DIAGNOSIS — N179 Acute kidney failure, unspecified: Secondary | ICD-10-CM | POA: Diagnosis not present

## 2019-06-11 DIAGNOSIS — I4819 Other persistent atrial fibrillation: Secondary | ICD-10-CM | POA: Diagnosis not present

## 2019-06-11 DIAGNOSIS — N183 Chronic kidney disease, stage 3 unspecified: Secondary | ICD-10-CM | POA: Diagnosis not present

## 2019-06-12 NOTE — Addendum Note (Signed)
Addendum  created 06/12/19 0934 by Vista Deck, CRNA   Charge Capture section accepted

## 2019-06-13 ENCOUNTER — Other Ambulatory Visit: Payer: Self-pay | Admitting: *Deleted

## 2019-06-13 DIAGNOSIS — I8500 Esophageal varices without bleeding: Secondary | ICD-10-CM | POA: Diagnosis not present

## 2019-06-13 DIAGNOSIS — N179 Acute kidney failure, unspecified: Secondary | ICD-10-CM | POA: Diagnosis not present

## 2019-06-13 DIAGNOSIS — N183 Chronic kidney disease, stage 3 unspecified: Secondary | ICD-10-CM | POA: Diagnosis not present

## 2019-06-13 DIAGNOSIS — I130 Hypertensive heart and chronic kidney disease with heart failure and stage 1 through stage 4 chronic kidney disease, or unspecified chronic kidney disease: Secondary | ICD-10-CM | POA: Diagnosis not present

## 2019-06-13 DIAGNOSIS — K7469 Other cirrhosis of liver: Secondary | ICD-10-CM | POA: Diagnosis not present

## 2019-06-13 DIAGNOSIS — I2510 Atherosclerotic heart disease of native coronary artery without angina pectoris: Secondary | ICD-10-CM | POA: Diagnosis not present

## 2019-06-13 DIAGNOSIS — R0789 Other chest pain: Secondary | ICD-10-CM | POA: Diagnosis not present

## 2019-06-13 DIAGNOSIS — E782 Mixed hyperlipidemia: Secondary | ICD-10-CM | POA: Diagnosis not present

## 2019-06-13 DIAGNOSIS — I5043 Acute on chronic combined systolic (congestive) and diastolic (congestive) heart failure: Secondary | ICD-10-CM | POA: Diagnosis not present

## 2019-06-13 DIAGNOSIS — I4819 Other persistent atrial fibrillation: Secondary | ICD-10-CM | POA: Diagnosis not present

## 2019-06-13 DIAGNOSIS — K7581 Nonalcoholic steatohepatitis (NASH): Secondary | ICD-10-CM | POA: Diagnosis not present

## 2019-06-13 DIAGNOSIS — J9601 Acute respiratory failure with hypoxia: Secondary | ICD-10-CM | POA: Diagnosis not present

## 2019-06-14 ENCOUNTER — Other Ambulatory Visit: Payer: Self-pay | Admitting: *Deleted

## 2019-06-14 NOTE — Patient Outreach (Signed)
[name]) Care Management  06/14/2019  Samantha [name] [birth year] [id number]   Third outreach call made today without contacting pt. I was however, able to leave a message today, stating that I have been trying to reach her without success, this will be my last call and if she cares to participate she can call me.  Eulah Pont. Myrtie Neither, MSN, [facility] Gerontological Nurse Practitioner [facility] Care Management [phone]

## 2019-06-23 DIAGNOSIS — I10 Essential (primary) hypertension: Secondary | ICD-10-CM | POA: Diagnosis not present

## 2019-06-23 DIAGNOSIS — I5022 Chronic systolic (congestive) heart failure: Secondary | ICD-10-CM | POA: Diagnosis not present

## 2019-06-23 DIAGNOSIS — N183 Chronic kidney disease, stage 3 unspecified: Secondary | ICD-10-CM | POA: Diagnosis not present

## 2019-06-23 DIAGNOSIS — J441 Chronic obstructive pulmonary disease with (acute) exacerbation: Secondary | ICD-10-CM | POA: Diagnosis not present

## 2019-06-23 DIAGNOSIS — R1013 Epigastric pain: Secondary | ICD-10-CM | POA: Diagnosis not present

## 2019-06-24 ENCOUNTER — Ambulatory Visit: Payer: Medicare Other | Admitting: Cardiovascular Disease

## 2019-06-27 DIAGNOSIS — I10 Essential (primary) hypertension: Secondary | ICD-10-CM | POA: Diagnosis not present

## 2019-06-27 DIAGNOSIS — G4700 Insomnia, unspecified: Secondary | ICD-10-CM | POA: Diagnosis not present

## 2019-06-28 DIAGNOSIS — I10 Essential (primary) hypertension: Secondary | ICD-10-CM | POA: Diagnosis not present

## 2019-06-28 DIAGNOSIS — G4700 Insomnia, unspecified: Secondary | ICD-10-CM | POA: Diagnosis not present

## 2019-07-07 NOTE — Progress Notes (Signed)
Cardiology Office Note:    Date:  07/08/2019   ID:  Samantha [name], DOB 1950/05/14, MRN [id number]  PCP:  Samantha Hilding, MD  Cardiologist:  Samantha Mocha, MD   Electrophysiologist:  None   Referring MD: Samantha Hilding, MD   Chief Complaint:  Follow-up (CAD, CHF, atrial fibrillation/flutter)    Patient Profile:    Samantha [name] is a 69 y.o. female with:   Coronary artery disease s/p CABG in 2001 ? S/p multiple PCI procedures ? Chronic angina  Non-alcoholic cirrhosis ? Esophageal varices ? Thrombocytopenia (04/2018: PLT 78G)  Chronic systolic CHF EF 95-62 ? Admx in 3/19 with CHF >> followed with Samantha [name] until 6/19 in [facility]  ? EF 40-45 in 10/2017 ? admx in 03/2018 with CHF, worsening anemia >> Tx with PRBCs  Diabetes mellitus  Atrial Flutter  Persistent AFib ? Not a candidate for anticoagulation due to cirrhosis/varices  Hypertension   Hyperlipidemia   COPD  Peripheral Arterial Disease    CT 04/2019: severe distal abdominal aorta and prox bilat CIA stenoses   Prior CV studies: Abd/Pelvic CTA 05/23/2019 High grade stenosis dist Abd Aorta prox to bifurcation; high grade stenosis in bilat proximal CIA Cirrhosis with stigmata of portal hypertension  Cardiomegaly Aortic atherosclerosis   Echocardiogram 05/20/2019 EF 50-55, no R WMA, mild LVH, low normal RV SF, RVSP 27.2, moderate LAE, mild RAE, mild MR, mild-moderate TR  Echo 11/15/2017 EF 40-45, inferior and lateral hypokinesis, grade 2 diastolic dysfunction, mild LAE, trivial TR, PASP 37  Right heart catheterization 05/29/2017 Findings: RA = 11 RV = 51/12 PA = 51/13 (32) PCW = 18 (v = 25-30) Fick cardiac output/index = 9.7/5.0 Thermo CO/CI = 7.6/3.9 PVR = 1.8 WU FA sat = 96% PA sat = 73%, 76% SVC sat = 67% Assessment: 1. Mild to moderately elevated R-sided pressures 2. Minimally elevated wedge pressure with prominent v-waves likely due to diastolic dysfunction 3. High-cardiac output likely  due to cirrhotic physiology  4. No evidence of significant intra-cardiac shunting  Echo 05/25/2017 Mild LVH, EF 50-55, MAC, mild MR, moderate LAE, mildly decreased RVSF, moderate RAE, moderate TR, PASP 54  Echo 01/25/17 Mild LVH, EF 30-35, inferior/inferolateral and anterolateral HK, MAC, mild LAE, moderately reduced RVSF, mild RAE, PASP 27  Cardiac catheterization 08/15/16 LAD D1 ostial 65 LCx ostial stent patent with 40 ISR RCA proximal 95 LIMA-OM 2 occluded SVG-AM. patent SVG-D1 patent   History of Present Illness:    Samantha [name] was last seen in July 2020 via telemedicine.  She was admitted in March 2021 with severe abdominal and chest pain.  High-sensitivity troponins remain normal.  Chest CT was negative for pulmonary embolism or dissection.  Abdominal and pelvic CTA did not demonstrate any acute findings.  HIDA scan was negative for obstruction and MRCP was negative for choledocholithiasis.  It was felt that her symptoms were likely related to musculoskeletal pain.  Patient was noted to be hypoxic and was discharged home on O2.  This was felt to be secondary to chronic hypoxia related to COPD.   She returns for follow-up.  She notes several episodes of chest pain in the last couple of months.  These episodes are quite severe and she has assoc L arm numbness.  She has taken NTG for the pain.  She sometimes gets relief with nitroglycerin.  At other times, she does not.  She tells me she was given Dilaudid in the hospital to make the pain go away.  It  has occurred at times after eating.  Other times, it occurs at rest.  She has chronic shortness of breath.  This is unchanged.  She cannot carry the O2 due to back problems.  She sleeps on 2 pillows.  She has not had leg swelling.  She has noted dizziness.  She describes it as spinning.  It often occurs with change in head position.  She has not had syncope.  But, she has noted near syncope at times.  She has noted her HR is higher than  normal.         Past Medical History:  Diagnosis Date  . Cataract    OU  . Chronic combined systolic and diastolic CHF (congestive heart failure) ([place])   . Cirrhosis of liver ([place])   . CKD (chronic kidney disease), stage III   . Coronary artery disease    a. s/p CABG 2001 w/ (LIMA-OM, SVG-D1, SVG-RCA). b. h/o multiple PCIs per Dr. Antionette [name] note.  . Depression   . Esophageal varices ([place])    New 2013  . Gastroesophageal reflux disease   . History of pneumonia   . Hyperlipidemia   . Hypertension   . Hypertensive retinopathy    OU  . Obesity   . Osteoarthritis   . Pancytopenia ([place])   . Paroxysmal atrial flutter ([place])    a. dx 05/2016.  [name] Persistent atrial fibrillation ([place])    a. reported in hosp 07/2016, not on anticoag due to cirrhosis and liver disease, low platelets, varices.  . Thrombocytopenia (HCC)     Current Medications: Current Meds  Medication Sig  . albuterol (PROVENTIL HFA;VENTOLIN HFA) 108 (90 BASE) MCG/ACT inhaler Inhale 2 puffs into the lungs every 6 (six) hours as needed for wheezing or shortness of breath.   . ezetimibe (ZETIA) 10 MG tablet Take 10 mg by mouth at bedtime.   [name] FLUoxetine (PROZAC) 20 MG capsule Take 20 mg by mouth daily.   [name] HYDROcodone-acetaminophen (NORCO) 10-325 MG tablet Take 1 tablet by mouth every 6 (six) hours as needed for moderate pain.   . hydroxypropyl methylcellulose / hypromellose (ISOPTO TEARS / GONIOVISC) 2.5 % ophthalmic solution Place 1 drop into both eyes 3 (three) times daily as needed for dry eyes.  . isosorbide mononitrate (IMDUR) 60 MG 24 hr tablet Take 1.5 tablets (90 mg total) by mouth daily.  [name] NITROSTAT 0.4 MG SL tablet Place 0.4 mg under the tongue every 5 (five) minutes as needed for chest pain (x 3 tabs daily).   . pantoprazole (PROTONIX) 40 MG tablet Take 40 mg by mouth daily.  . polyethylene glycol (MIRALAX / GLYCOLAX) packet Take 17 g by mouth daily.  [name] spironolactone (ALDACTONE) 25 MG tablet Take 1 tablet (25 mg  total) by mouth daily.  [name] torsemide (DEMADEX) 20 MG tablet Take 2 tablets (40 mg total) by mouth daily.  . [DISCONTINUED] metoprolol succinate (TOPROL XL) 25 MG 24 hr tablet Take 1 tablet (25 mg total) by mouth daily.     Allergies:   Entresto [sacubitril-valsartan], Acetaminophen, Oxycodone, Ace inhibitors, Cefaclor, Cephalexin, Penicillins, Pregabalin, and Tape   Social History   Tobacco Use  . Smoking status: Former Smoker    Packs/day: 0.50    Years: 20.00    Pack years: 10.00    Types: Cigarettes    Quit date: 07/21/1995    Years since quitting: 23.9  . Smokeless tobacco: Never Used  Substance Use Topics  . Alcohol use: No  . Drug use: No  Family Hx: The patient's family history includes Coronary artery disease in her sister; Diabetes in her brother, father, mother, and sister; Glaucoma in her sister; Heart attack in her mother; Heart attack (age of onset: 34) in her father. There is no history of Colon cancer.  ROS   EKGs/Labs/Other Test Reviewed:    EKG:  EKG is   ordered today.  The ekg ordered today demonstrates probable accelerated idioventricular rhythm, ventricular rate 95  Recent Labs: 05/23/2019: ALT 12; B Natriuretic Peptide 134.0 05/24/2019: BUN 13; Creatinine, Ser 0.99; Hemoglobin 11.4; Magnesium 1.6; Platelets 44; Potassium 3.5; Sodium 142   Recent Lipid Panel Lab Results  Component Value Date/Time   CHOL 111 05/26/2017 06:37 AM   TRIG 93 05/26/2017 06:37 AM   HDL 38 (L) 05/26/2017 06:37 AM   CHOLHDL 2.9 05/26/2017 06:37 AM   LDLCALC 54 05/26/2017 06:37 AM    Physical Exam:    VS:  BP 118/70   Pulse 95   Ht _0  (1.6 m)   Wt 178 lb (80.7 kg)   LMP 03/29/2011   SpO2 94%   BMI 31.53 kg/m     Wt Readings from Last 3 Encounters:  07/08/19 178 lb (80.7 kg)  06/05/19 190 lb 3.2 oz (86.3 kg)  05/19/19 190 lb 3.2 oz (86.3 kg)     Constitutional:      Appearance: Healthy appearance. Not in distress.  Neck:     Thyroid: No thyromegaly.      Vascular: JVD normal.  Pulmonary:     Effort: Pulmonary effort is normal.     Breath sounds: No wheezing. No rales.  Cardiovascular:     Normal rate. Regular rhythm. Normal S1. Normal S2.     Murmurs: There is no murmur.  Edema:    Peripheral edema absent.  Abdominal:     Palpations: Abdomen is soft. There is no hepatomegaly.  Skin:    General: Skin is warm and dry.  Neurological:     Mental Status: Alert and oriented to person, place and time.     Cranial Nerves: Cranial nerves are intact.       ASSESSMENT & PLAN:    1. AIVR (accelerated idioventricular rhythm) (HCC) Her electrocardiogram today demonstrates a significantly different morphology.  I reviewed her electrocardiogram today with Dr. Caryl Comes (EP) and her primary cardiologist, Dr. Burt Knack.  Dr. Caryl Comes reviewed several other electrocardiograms and feels that her current rhythm is accelerated idioventricular rhythm.  This may in fact be the cause of some of her symptoms, namely her dizziness.  Therefore, I will stop her metoprolol succinate to hopefully allow her intrinsic atrial rhythm to take over.  I have instructed her to contact us if she remains dizzy after stopping metoprolol.  If she remains dizzy, I will have her wear a ZIO AT monitor to try to match up her symptoms with her rhythm.  She will follow up in 3 to 4 weeks.  2. Coronary artery disease involving native coronary artery of native heart with angina pectoris ([place]) History of CABG in 2001 and multiple PCI procedures since then.  She has chronic angina.  She is not a candidate for further invasive testing as she is not a candidate for antiplatelet therapy given her cirrhosis, esophageal varices and thrombocytopenia.  She has had significant episodes of chest pain recently.  When she was in the hospital in March, her high-sensitivity troponins were all negative.  She had prolonged symptoms prior to this.  Her echocardiogram actually demonstrated improved  LV function with  normal EF.  It is not clear that her symptoms are definitely anginal.  However, some of her symptoms have felt anginal to her.  Question if her current arrhythmia may be contributing to some of her symptoms.  She had a negative GI work-up in the hospital.  She has had intermittent response to nitroglycerin.  As noted above, I will stop her metoprolol succinate.  At follow-up, we could consider increasing her isosorbide if her blood pressure can tolerate this.  3. Chronic combined systolic and diastolic CHF (congestive heart failure) (HCC) Previous EF was as low as 25-35%.  Echocardiogram in the hospital March 2021 demonstrated an EF of 50-55%.  She is no longer on Diovan secondary to hypotension.  As noted above, we are stopping her beta-blocker due to accelerated idioventricular rhythm.  If her blood pressure can tolerate it, consider resuming ARB therapy at follow-up.  Current volume status is stable.  4. Persistent atrial fibrillation ([place]) As noted, her current rhythm is accelerated idioventricular rhythm.  She is not on anticoagulation due to cirrhosis, esophageal varices and thrombocytopenia.  5. Essential hypertension The patient's blood pressure is controlled on her current regimen.  Continue current therapy.     Dispo:  Return in about 4 weeks (around 08/05/2019) for Close Follow Up, w/ Dr. Burt Knack, or Richardson Dopp, PA-C, in person.   Medication Adjustments/Labs and Tests Ordered: Current medicines are reviewed at length with the patient today.  Concerns regarding medicines are outlined above.  Tests Ordered: Orders Placed This Encounter  Procedures  . EKG 12-Lead   Medication Changes: No orders of the defined types were placed in this encounter.   Signed, Richardson Dopp, PA-C  07/08/2019 5:25 PM    [facility] [place], [place], [place]  [address] Phone: [phone]; Fax: [phone]

## 2019-07-08 ENCOUNTER — Ambulatory Visit: Payer: Medicare Other | Admitting: Physician Assistant

## 2019-07-08 ENCOUNTER — Other Ambulatory Visit: Payer: Self-pay

## 2019-07-08 ENCOUNTER — Encounter: Payer: Self-pay | Admitting: Physician Assistant

## 2019-07-08 VITALS — BP 118/70 | HR 95 | Ht 63.0 in | Wt 178.0 lb

## 2019-07-08 DIAGNOSIS — I25119 Atherosclerotic heart disease of native coronary artery with unspecified angina pectoris: Secondary | ICD-10-CM | POA: Diagnosis not present

## 2019-07-08 DIAGNOSIS — I5042 Chronic combined systolic (congestive) and diastolic (congestive) heart failure: Secondary | ICD-10-CM

## 2019-07-08 DIAGNOSIS — I442 Atrioventricular block, complete: Secondary | ICD-10-CM | POA: Diagnosis not present

## 2019-07-08 DIAGNOSIS — I2510 Atherosclerotic heart disease of native coronary artery without angina pectoris: Secondary | ICD-10-CM

## 2019-07-08 DIAGNOSIS — K7460 Unspecified cirrhosis of liver: Secondary | ICD-10-CM

## 2019-07-08 DIAGNOSIS — I10 Essential (primary) hypertension: Secondary | ICD-10-CM

## 2019-07-08 DIAGNOSIS — E782 Mixed hyperlipidemia: Secondary | ICD-10-CM

## 2019-07-08 DIAGNOSIS — I4819 Other persistent atrial fibrillation: Secondary | ICD-10-CM

## 2019-07-08 NOTE — Patient Instructions (Addendum)
Medication Instructions:   Your physician has recommended you make the following change in your medication:   1) Stop Metoprolol 25 mg  *If you need a refill on your cardiac medications before your next appointment, please call your pharmacy*  Lab Work:  None ordered today  Testing/Procedures:  None ordered today  Follow-Up: At [facility], you and your health needs are our priority.  As part of our continuing mission to provide you with exceptional heart care, we have created designated Provider Care Teams.  These Care Teams include your primary Cardiologist (physician) and Advanced Practice Providers (APPs -  Physician Assistants and Nurse Practitioners) who all work together to provide you with the care you need, when you need it.  We recommend signing up for the patient portal called "MyChart".  Sign up information is provided on this After Visit Summary.  MyChart is used to connect with patients for Virtual Visits (Telemedicine).  Patients are able to view lab/test results, encounter notes, upcoming appointments, etc.  Non-urgent messages can be sent to your provider as well.   To learn more about what you can do with MyChart, go to [email or web address].    Your next appointment:    On 07/26/19 at 10:45AM with Richardson Dopp, PA-C

## 2019-07-19 DIAGNOSIS — I10 Essential (primary) hypertension: Secondary | ICD-10-CM | POA: Diagnosis not present

## 2019-07-19 DIAGNOSIS — E1140 Type 2 diabetes mellitus with diabetic neuropathy, unspecified: Secondary | ICD-10-CM | POA: Diagnosis not present

## 2019-07-19 DIAGNOSIS — N183 Chronic kidney disease, stage 3 unspecified: Secondary | ICD-10-CM | POA: Diagnosis not present

## 2019-07-19 DIAGNOSIS — K2100 Gastro-esophageal reflux disease with esophagitis, without bleeding: Secondary | ICD-10-CM | POA: Diagnosis not present

## 2019-07-23 DIAGNOSIS — Z0001 Encounter for general adult medical examination with abnormal findings: Secondary | ICD-10-CM | POA: Diagnosis not present

## 2019-07-23 DIAGNOSIS — D649 Anemia, unspecified: Secondary | ICD-10-CM | POA: Diagnosis not present

## 2019-07-23 DIAGNOSIS — R300 Dysuria: Secondary | ICD-10-CM | POA: Diagnosis not present

## 2019-07-23 DIAGNOSIS — I10 Essential (primary) hypertension: Secondary | ICD-10-CM | POA: Diagnosis not present

## 2019-07-23 DIAGNOSIS — D696 Thrombocytopenia, unspecified: Secondary | ICD-10-CM | POA: Diagnosis not present

## 2019-07-23 DIAGNOSIS — R4582 Worries: Secondary | ICD-10-CM | POA: Diagnosis not present

## 2019-07-23 DIAGNOSIS — G4709 Other insomnia: Secondary | ICD-10-CM | POA: Diagnosis not present

## 2019-07-23 DIAGNOSIS — I25119 Atherosclerotic heart disease of native coronary artery with unspecified angina pectoris: Secondary | ICD-10-CM | POA: Diagnosis not present

## 2019-07-23 DIAGNOSIS — D72819 Decreased white blood cell count, unspecified: Secondary | ICD-10-CM | POA: Diagnosis not present

## 2019-07-25 NOTE — Progress Notes (Addendum)
Cardiology Office Note:    Date:  07/26/2019   ID:  Samantha, [name] [birth year], MRN [id number]  PCP:  [name], MD  Cardiologist:  Sherren Mocha, MD  Electrophysiologist:  None   Referring MD: [name], MD   Chief Complaint:  Follow-up (CAD, CHF, AFib, AIVR)    Patient Profile:    Samantha [name] is a 69 y.o. female with:   Coronary artery disease s/p CABG in 2001 ? S/p multiple PCI procedures ? Chronic angina  Non-alcoholic cirrhosis ? Esophageal varices ? Thrombocytopenia (04/2018: PLT 40G)  Chronic systolic CHF EF 86-76 ? Admx in 3/19 with CHF >>followed with Dr. Haroldine Laws until 6/19 in [facility]  ? EF 40-45 in 10/2017 ? admx in 03/2018 with CHF, worsening anemia >> Tx with PRBCs ? Echo 04/2019: EF 50-55  Diabetes mellitus  Atrial Flutter  Persistent AFib ? Not a candidate foranticoagulationdue to cirrhosis/varices  Accelerated idioventricular rhythm  Beta-blocker DC  Hypertension   Hyperlipidemia  COPD  Peripheral Arterial Disease   ? CT 04/2019: severe distal abdominal aorta and prox bilat CIA stenoses   Prior CV studies: Abd/Pelvic CTA 05/23/2019 High grade stenosis dist Abd Aorta prox to bifurcation; high grade stenosis in bilat proximal CIA Cirrhosis with stigmata of portal hypertension  Cardiomegaly Aortic atherosclerosis   Echocardiogram 05/20/2019 EF 50-55, no R WMA, mild LVH, low normal RV SF, RVSP 27.2, moderate LAE, mild RAE, mild MR, mild-moderate TR  Echo 11/15/2017 EF 40-45, inferior and lateral hypokinesis, grade 2 diastolic dysfunction, mild LAE, trivial TR, PASP 37  Right heart catheterization 05/29/2017 Findings: RA = 11 RV = 51/12 PA = 51/13 (32) PCW = 18 (v = 25-30) Fick cardiac output/index = 9.7/5.0 Thermo CO/CI = 7.6/3.9 PVR = 1.8 WU FA sat = 96% PA sat = 73%, 76% SVC sat = 67% Assessment: 1. Mild to moderately elevated R-sided pressures 2. Minimally elevated wedge pressure with prominent v-waves  likely due to diastolic dysfunction 3. High-cardiac output likely due to cirrhotic physiology  4. No evidence of significant intra-cardiac shunting  Echo 05/25/2017 Mild LVH, EF 50-55, MAC, mild MR, moderate LAE, mildly decreased RVSF, moderate RAE, moderate TR, PASP 54  Echo 01/25/17 Mild LVH, EF 30-35, inferior/inferolateral and anterolateral HK, MAC, mild LAE, moderately reduced RVSF, mild RAE, PASP 27  Cardiac catheterization 08/15/16 LAD D1 ostial 65 LCx ostial stent patent with 40 ISR RCA proximal 95 LIMA-OM 2 occluded SVG-AM. patent SVG-D1 patent  History of Present Illness:    [name] was admitted in 04/2019 with chest pain.  Cardiac and GI workups were unremarkable.  She was seen in follow up 07/08/19.  She continued to have episodes of chest pain and dizziness.  Her ECG demonstrated AIVR.  I stopped her Metoprolol Succinate.  She returns for follow up.  She is here alone.  Since last seen, she has not had further chest discomfort.  She continues to have dizzy spells.  These often occur when she stands.  She has recorded low blood pressures with these.  She also has had high heart rates at times.  She has not had syncope but does describe near syncope.  She has had symptoms sitting at rest.  She has not had orthopnea, paroxysmal nocturnal dyspnea, significant leg swelling.  Past Medical History:  Diagnosis Date  . Cataract    OU  . Chronic combined systolic and diastolic CHF (congestive heart failure) ([place])   . Cirrhosis of liver ([place])   . CKD (  chronic kidney disease), stage III   . Coronary artery disease    a. s/p CABG 2001 w/ (LIMA-OM, SVG-D1, SVG-RCA). b. h/o multiple PCIs per Dr. Antionette Char note.  . Depression   . Esophageal varices ([place])    New 2013  . Gastroesophageal reflux disease   . History of pneumonia   . Hyperlipidemia   . Hypertension   . Hypertensive retinopathy    OU  . Obesity   . Osteoarthritis   . Pancytopenia ([place])   . Paroxysmal atrial flutter  ([place])    a. dx 05/2016.  [name] Persistent atrial fibrillation ([place])    a. reported in hosp 07/2016, not on anticoag due to cirrhosis and liver disease, low platelets, varices.  . Thrombocytopenia (HCC)     Current Medications: Current Meds  Medication Sig  . albuterol (PROVENTIL HFA;VENTOLIN HFA) 108 (90 BASE) MCG/ACT inhaler Inhale 2 puffs into the lungs every 6 (six) hours as needed for wheezing or shortness of breath.   . ezetimibe (ZETIA) 10 MG tablet Take 10 mg by mouth at bedtime.   [name] FLUoxetine (PROZAC) 20 MG capsule Take 20 mg by mouth daily.   [name] HYDROcodone-acetaminophen (NORCO) 10-325 MG tablet Take 1 tablet by mouth every 6 (six) hours as needed for moderate pain.   . hydroxypropyl methylcellulose / hypromellose (ISOPTO TEARS / GONIOVISC) 2.5 % ophthalmic solution Place 1 drop into both eyes 3 (three) times daily as needed for dry eyes.  . isosorbide mononitrate (IMDUR) 60 MG 24 hr tablet Take 1.5 tablets (90 mg total) by mouth daily.  [name] NITROSTAT 0.4 MG SL tablet Place 0.4 mg under the tongue every 5 (five) minutes as needed for chest pain (x 3 tabs daily).   . pantoprazole (PROTONIX) 40 MG tablet Take 40 mg by mouth daily.  . polyethylene glycol (MIRALAX / GLYCOLAX) packet Take 17 g by mouth daily.  [name] spironolactone (ALDACTONE) 25 MG tablet Take 1 tablet (25 mg total) by mouth daily.     Allergies:   Entresto [sacubitril-valsartan], Acetaminophen, Oxycodone, Ace inhibitors, Cefaclor, Cephalexin, Penicillins, Pregabalin, and Tape   Social History   Tobacco Use  . Smoking status: Former Smoker    Packs/day: 0.50    Years: 20.00    Pack years: 10.00    Types: Cigarettes    Quit date: 07/21/1995    Years since quitting: 24.0  . Smokeless tobacco: Never Used  Substance Use Topics  . Alcohol use: No  . Drug use: No     Family Hx: The patient's family history includes Coronary artery disease in her sister; Diabetes in her brother, father, mother, and sister; Glaucoma in her  sister; Heart attack in her mother; Heart attack (age of onset: 34) in her father. There is no history of Colon cancer.  Review of Systems  Gastrointestinal: Negative for hematochezia and melena.  Genitourinary: Negative for hematuria.       Dark color     EKGs/Labs/Other Test Reviewed:    EKG:  EKG is  ordered today.  The ekg ordered today demonstrates normal sinus rhythm, heart rate 78, right axis deviation, T wave inversions 2, 3, aVF, V4-V6, interventricular conduction delay versus LVH with repolarization abnormality, QTC 524, similar to prior tracings  Recent Labs: 05/23/2019: ALT 12; B Natriuretic Peptide 134.0 05/24/2019: BUN 13; Creatinine, Ser 0.99; Hemoglobin 11.4; Magnesium 1.6; Platelets 44; Potassium 3.5; Sodium 142   Recent Lipid Panel Lab Results  Component Value Date/Time   CHOL 111 05/26/2017 06:37 AM   TRIG  93 05/26/2017 06:37 AM   HDL 38 (L) 05/26/2017 06:37 AM   CHOLHDL 2.9 05/26/2017 06:37 AM   LDLCALC 54 05/26/2017 06:37 AM    Physical Exam:    VS:  BP 118/66   Pulse 79   Ht _0  (1.6 m)   Wt 181 lb (82.1 kg)   LMP 03/29/2011   SpO2 93%   BMI 32.06 kg/m     Wt Readings from Last 3 Encounters:  07/26/19 181 lb (82.1 kg)  07/08/19 178 lb (80.7 kg)  06/05/19 190 lb 3.2 oz (86.3 kg)     Constitutional:      Appearance: Healthy appearance. Not in distress.  Neck:     Thyroid: No thyromegaly.     Vascular: JVD normal.  Pulmonary:     Effort: Pulmonary effort is normal.     Breath sounds: No wheezing. No rales.  Cardiovascular:     Normal rate. Regular rhythm. Normal S1. Normal S2.     Murmurs: There is no murmur.  Edema:    Peripheral edema absent.  Abdominal:     Palpations: Abdomen is soft.  Musculoskeletal: Normal range of motion. Skin:    General: Skin is warm and dry.  Neurological:     General: No focal deficit present.     Mental Status: Alert and oriented to person, place and time.      ASSESSMENT & PLAN:    1. AIVR  (accelerated idioventricular rhythm) (HCC) 2. Palpitations 3. Near syncope When last seen, she was in accelerated idioventricular rhythm.  I reviewed her case with Dr. Caryl Comes and we decided to stop her beta-blocker.  She is now back in sinus rhythm.  She has not had further chest pain.  She does continue to have dizzy spells.  In reviewing her chart, she has had some EKGs in the hospital that demonstrated junctional rhythm.  She also describes periods of rapid palpitations.  I have recommended proceeding with an event monitor to better characterize her rhythm.  -30-day event monitor  4. Coronary artery disease involving native coronary artery of native heart with angina pectoris ([place]) History of CABG in 2001 and multiple PCI procedures since.  She has chronic angina.  She is not a candidate for invasive evaluation such as cardiac catheterization.  She cannot take antiplatelet therapy due to her history of cirrhosis and esophageal varices.  Overall, her anginal symptoms are stable.  Continue current dose of ezetimibe, isosorbide.  5. Chronic combined systolic and diastolic CHF (congestive heart failure) (HCC) Her ejection fraction has improved.  Most recent echocardiogram in March 2021 demonstrated normal LV function.  Overall, volume status is stable.  Continue current dose of torsemide, spironolactone, isosorbide.  She is currently off of ARB therapy.  She does have some symptoms that sound consistent with orthostasis.  It would better evaluate her symptoms with event monitor, I will hold off on resuming her ARB for now.  6. Persistent atrial fibrillation (HCC) Currently maintaining sinus rhythm.  She is not a candidate for anticoagulation given her history of cirrhosis with esophageal varices and thrombocytopenia.    Dispo:  Return in about 3 months (around 10/26/2019) for Routine Follow Up, w/ Dr. Burt Knack, in person.   Medication Adjustments/Labs and Tests Ordered: Current medicines are reviewed  at length with the patient today.  Concerns regarding medicines are outlined above.  Tests Ordered: Orders Placed This Encounter  Procedures  . CARDIAC EVENT MONITOR  . EKG 12-Lead   Medication Changes: No orders  of the defined types were placed in this encounter.   Signed, Richardson Dopp, PA-C  07/26/2019 11:35 AM    [facility] [place], [place], [place]  [address] Phone: [phone]; Fax: [phone]

## 2019-07-26 ENCOUNTER — Other Ambulatory Visit: Payer: Self-pay

## 2019-07-26 ENCOUNTER — Encounter: Payer: Self-pay | Admitting: Physician Assistant

## 2019-07-26 ENCOUNTER — Telehealth: Payer: Self-pay | Admitting: Radiology

## 2019-07-26 ENCOUNTER — Ambulatory Visit (INDEPENDENT_AMBULATORY_CARE_PROVIDER_SITE_OTHER): Payer: Medicare Other | Admitting: Physician Assistant

## 2019-07-26 VITALS — BP 118/66 | HR 79 | Ht 63.0 in | Wt 181.0 lb

## 2019-07-26 DIAGNOSIS — R002 Palpitations: Secondary | ICD-10-CM | POA: Diagnosis not present

## 2019-07-26 DIAGNOSIS — I25119 Atherosclerotic heart disease of native coronary artery with unspecified angina pectoris: Secondary | ICD-10-CM | POA: Diagnosis not present

## 2019-07-26 DIAGNOSIS — R55 Syncope and collapse: Secondary | ICD-10-CM

## 2019-07-26 DIAGNOSIS — I4819 Other persistent atrial fibrillation: Secondary | ICD-10-CM

## 2019-07-26 DIAGNOSIS — I5042 Chronic combined systolic (congestive) and diastolic (congestive) heart failure: Secondary | ICD-10-CM

## 2019-07-26 DIAGNOSIS — I442 Atrioventricular block, complete: Secondary | ICD-10-CM

## 2019-07-26 NOTE — Patient Instructions (Signed)
Medication Instructions:   Your physician recommends that you continue on your current medications as directed. Please refer to the Current Medication list given to you today.  *If you need a refill on your cardiac medications before your next appointment, please call your pharmacy*  Lab Work:  None ordered today  If you have labs (blood work) drawn today and your tests are completely normal, you will receive your results only by: [name] MyChart Message (if you have MyChart) OR . A paper copy in the mail If you have any lab test that is abnormal or we need to change your treatment, we will call you to review the results.   Testing/Procedures:  [employer] Cardiac Event Monitor Instructions Your physician has requested you wear your cardiac event monitor for 30 days. [employer] may call or text to confirm a shipping address. The monitor will be sent to a land address via UPS. [employer] will not ship a monitor to a PO BOX. It typically takes 3-5 days to receive your monitor after it has been enrolled. [employer] will assist with USPS tracking if your package is delayed. The telephone number for [employer] is [phone]. Once you have received your monitor, please review the enclosed instructions. Instruction tutorials can also be viewed under help and settings on the enclosed cell phone. Your monitor has already been registered assigning a specific monitor serial # to you.  Applying the monitor Remove cell phone from case and turn it on. The cell phone works as Dealer and needs to be within [employer] of you at all times. The cell phone will need to be charged on a daily basis. We recommend you plug the cell phone into the enclosed charger at your bedside table every night.  Monitor batteries: You will receive two monitor batteries labelled #1 and #2. These are your recorders. Plug battery #2 onto the second connection on the enclosed charger. Keep one battery on the charger at  all times. This will keep the monitor battery deactivated. It will also keep it fully charged for when you need to switch your monitor batteries. A small light will be blinking on the battery emblem when it is charging. The light on the battery emblem will remain on when the battery is fully charged.  Open package of a Monitor strip. Insert battery #1 into black hood on strip and gently squeeze monitor battery onto connection as indicated in instruction booklet. Set aside while preparing skin.  Choose location for your strip, vertical or horizontal, as indicated in the instruction booklet. Shave to remove all hair from location. There cannot be any lotions, oils, powders, or colognes on skin where monitor is to be applied. Wipe skin clean with enclosed Saline wipe. Dry skin completely.  Peel paper labeled #1 off the back of the Monitor strip exposing the adhesive. Place the monitor on the chest in the vertical or horizontal position shown in the instruction booklet. One arrow on the monitor strip must be pointing upward. Carefully remove paper labeled #2, attaching remainder of strip to your skin. Try not to create any folds or wrinkles in the strip as you apply it.  Firmly press and release the circle in the center of the monitor battery. You will hear a small beep. This is turning the monitor battery on. The heart emblem on the monitor battery will light up every 5 seconds if the monitor battery in turned on and connected to the patient securely. Do not push and hold the circle down  as this turns the monitor battery off. The cell phone will locate the monitor battery. A screen will appear on the cell phone checking the connection of your monitor strip. This may read poor connection initially but change to good connection within the next minute. Once your monitor accepts the connection you will hear a series of 3 beeps followed by a climbing crescendo of beeps. A screen will appear on the  cell phone showing the two monitor strip placement options. Touch the picture that demonstrates where you applied the monitor strip.  Your monitor strip and battery are waterproof. You are able to shower, bathe, or swim with the monitor on. They just ask you do not submerge deeper than 3 feet underwater. We recommend removing the monitor if you are swimming in a lake, river, or ocean.  Your monitor battery will need to be switched to a fully charged monitor battery approximately once a week. The cell phone will alert you of an action which needs to be made.  On the cell phone, tap for details to reveal connection status, monitor battery status, and cell phone battery status. The green dots indicates your monitor is in good status. A red dot indicates there is something that needs your attention.  To record a symptom, click the circle on the monitor battery. In 30-60 seconds a list of symptoms will appear on the cell phone. Select your symptom and tap save. Your monitor will record a sustained or significant arrhythmia regardless of you clicking the button. Some patients do not feel the heart rhythm irregularities. [employer] will notify us of any serious or critical events.  Refer to instruction booklet for instructions on switching batteries, changing strips, the Do not disturb or Pause features, or any additional questions.  Call [employer] at [phone], to confirm your monitor is transmitting and record your baseline. They will answer any questions you may have regarding the monitor instructions at that time.  Returning the monitor to [place] all equipment back into blue box. Peel off strip of paper to expose adhesive and close box securely. There is a prepaid UPS shipping label on this box. Drop in a UPS drop box, or at a UPS facility like [employer]. You may also contact [employer] to arrange UPS to pick up monitor package at your home.    Follow-Up: At [facility], you and your health needs are our priority.  As part of our continuing mission to provide you with exceptional heart care, we have created designated Provider Care Teams.  These Care Teams include your primary Cardiologist (physician) and Advanced Practice Providers (APPs -  Physician Assistants and Nurse Practitioners) who all work together to provide you with the care you need, when you need it.  We recommend signing up for the patient portal called "MyChart".  Sign up information is provided on this After Visit Summary.  MyChart is used to connect with patients for Virtual Visits (Telemedicine).  Patients are able to view lab/test results, encounter notes, upcoming appointments, etc.  Non-urgent messages can be sent to your provider as well.   To learn more about what you can do with MyChart, go to [email or web address].    Your next appointment:   3 month(s)  The format for your next appointment:   In Person  Provider:   Sherren Mocha, MD

## 2019-07-26 NOTE — Telephone Encounter (Signed)
Enrolled patient for a 30 day [employer] Event monitor to be mailed to patients home.  

## 2019-07-29 DIAGNOSIS — I130 Hypertensive heart and chronic kidney disease with heart failure and stage 1 through stage 4 chronic kidney disease, or unspecified chronic kidney disease: Secondary | ICD-10-CM | POA: Diagnosis not present

## 2019-07-29 DIAGNOSIS — I5023 Acute on chronic systolic (congestive) heart failure: Secondary | ICD-10-CM | POA: Diagnosis not present

## 2019-07-29 DIAGNOSIS — N183 Chronic kidney disease, stage 3 unspecified: Secondary | ICD-10-CM | POA: Diagnosis not present

## 2019-07-29 DIAGNOSIS — E1122 Type 2 diabetes mellitus with diabetic chronic kidney disease: Secondary | ICD-10-CM | POA: Diagnosis not present

## 2019-08-06 ENCOUNTER — Encounter (INDEPENDENT_AMBULATORY_CARE_PROVIDER_SITE_OTHER): Payer: Self-pay | Admitting: Internal Medicine

## 2019-08-06 ENCOUNTER — Ambulatory Visit (INDEPENDENT_AMBULATORY_CARE_PROVIDER_SITE_OTHER): Payer: Medicare Other | Admitting: Internal Medicine

## 2019-08-06 ENCOUNTER — Other Ambulatory Visit: Payer: Self-pay

## 2019-08-06 VITALS — BP 134/73 | HR 118 | Temp 96.9°F | Ht 63.0 in | Wt 179.2 lb

## 2019-08-06 DIAGNOSIS — K7460 Unspecified cirrhosis of liver: Secondary | ICD-10-CM

## 2019-08-06 DIAGNOSIS — K219 Gastro-esophageal reflux disease without esophagitis: Secondary | ICD-10-CM

## 2019-08-06 DIAGNOSIS — R0789 Other chest pain: Secondary | ICD-10-CM | POA: Diagnosis not present

## 2019-08-06 NOTE — Progress Notes (Signed)
Presenting complaint;  Follow-up for chronic liver disease. Intermittent postprandial retrosternal epigastric pain.  Database and subjective:  Patient is 69 year old Caucasian female who has cirrhosis secondary to NASH complicated by esophageal varices which have been eradicated with banding who has developed fundal varices and therefore not undergoing schedule EGDs.  She was hospitalized in March with severe postprandial epigastric/chest pain and had multiple studies which were all negative.  She had CT angio chest, abdominal pelvic CT, MRCP, HIDA scan and CT angio abdomen and pelvis.  She was treated symptomatically improved.  Patient says she has had 3 episodes since she was discharged.  These episodes have not been as severe as before.  She has taken pain pills which seem to have helped.  Pain is described to be sharp intense fairly localized over lower sternal area and epigastric region and may last for several minutes.  She has not experienced dysphagia odynophagia cough or shortness of breath.  She feels heartburn is well controlled with PPI.  She says her appetite is fair.  She eats 1 meal and snacks in between.  She states she recently gained 4 pounds in called her cardiologist and she was asked to take few extra doses of diuretic and her weight is is back to baseline. She says she had right cataract extraction in April and left fine and May 21.  She she remains with somewhat blurred vision. She denies melena or rectal bleeding.  Current Medications: Outpatient Encounter Medications as of 08/06/2019  Medication Sig  . albuterol (PROVENTIL HFA;VENTOLIN HFA) 108 (90 BASE) MCG/ACT inhaler Inhale 2 puffs into the lungs every 6 (six) hours as needed for wheezing or shortness of breath.   . ezetimibe (ZETIA) 10 MG tablet Take 10 mg by mouth at bedtime.   [name] FLUoxetine (PROZAC) 20 MG capsule Take 20 mg by mouth daily.   . hydroxypropyl methylcellulose / hypromellose (ISOPTO TEARS / GONIOVISC) 2.5 %  ophthalmic solution Place 1 drop into both eyes 3 (three) times daily as needed for dry eyes.  . isosorbide mononitrate (IMDUR) 60 MG 24 hr tablet Take 1.5 tablets (90 mg total) by mouth daily.  [name] NITROSTAT 0.4 MG SL tablet Place 0.4 mg under the tongue every 5 (five) minutes as needed for chest pain (x 3 tabs daily).   . Oxycodone HCl 10 MG TABS Take 10 mg by mouth 4 (four) times daily. Patient states that she takes as needed for Pain.  . pantoprazole (PROTONIX) 40 MG tablet Take 40 mg by mouth daily.  . polyethylene glycol (MIRALAX / GLYCOLAX) packet Take 17 g by mouth daily.  [name] spironolactone (ALDACTONE) 25 MG tablet Take 1 tablet (25 mg total) by mouth daily.  [name] torsemide (DEMADEX) 20 MG tablet Take 2 tablets (40 mg total) by mouth daily.  . [DISCONTINUED] HYDROcodone-acetaminophen (NORCO) 10-325 MG tablet Take 1 tablet by mouth every 6 (six) hours as needed for moderate pain.    No facility-administered encounter medications on file as of 08/06/2019.     Objective: Blood pressure 134/73, pulse (!) 118, temperature (!) 96.9 F (36.1 C), temperature source Temporal, height 5' 3"  (1.6 m), weight 179 lb 3.2 oz (81.3 kg), last menstrual period 03/29/2011. Patient is alert and in no acute distress. She is wearing facial mask. She does not have asterixis. Conjunctiva is pink. Sclera is nonicteric Oropharyngeal mucosa is normal. She has upper lower dentures in place. No neck masses or thyromegaly noted. Cardiac exam with irregular rhythm normal S1 and S2.  Grade 2/6  systolic murmur noted at left upper sternal border. Lungs are clear to auscultation. Abdomen is symmetrical soft and nontender with organomegaly or masses. No LE edema or clubbing noted.   Assessment:  #1.  Cirrhosis secondary to NASH complicated by esophageal varices which have been eradicated with banding and now she has developed frontal varices.  She is being monitored. She is up-to-date for [facility] screening.  #2.   Intermittent postprandial chest and epigastric pain.  She had extensive work-up during hospitalization in March 2021.  I wonder if she is having esophageal spasm.  If she has another episode of severe pain will consider esophagogastroduodenoscopy.  #3.  GERD.  Heartburn is well controlled with PPI.  Plan:  Patient advised to call office if she has another episode of severe chest or epigastric pain in which case we will consider check her LFTs and schedule esophagogastroduodenoscopy. Office visit in 6 months.

## 2019-08-06 NOTE — Patient Instructions (Addendum)
Will consider esophagogastroduodenoscopy if you have another episode of severe Chest/Epigastric Pain.

## 2019-08-28 DIAGNOSIS — N183 Chronic kidney disease, stage 3 unspecified: Secondary | ICD-10-CM | POA: Diagnosis not present

## 2019-08-28 DIAGNOSIS — I130 Hypertensive heart and chronic kidney disease with heart failure and stage 1 through stage 4 chronic kidney disease, or unspecified chronic kidney disease: Secondary | ICD-10-CM | POA: Diagnosis not present

## 2019-08-28 DIAGNOSIS — I5023 Acute on chronic systolic (congestive) heart failure: Secondary | ICD-10-CM | POA: Diagnosis not present

## 2019-08-28 DIAGNOSIS — E1122 Type 2 diabetes mellitus with diabetic chronic kidney disease: Secondary | ICD-10-CM | POA: Diagnosis not present

## 2019-09-17 DIAGNOSIS — M109 Gout, unspecified: Secondary | ICD-10-CM | POA: Diagnosis not present

## 2019-09-23 ENCOUNTER — Encounter (INDEPENDENT_AMBULATORY_CARE_PROVIDER_SITE_OTHER): Payer: Medicare Other | Admitting: Ophthalmology

## 2019-09-23 DIAGNOSIS — H3562 Retinal hemorrhage, left eye: Secondary | ICD-10-CM

## 2019-09-23 DIAGNOSIS — H43811 Vitreous degeneration, right eye: Secondary | ICD-10-CM

## 2019-09-23 DIAGNOSIS — H3581 Retinal edema: Secondary | ICD-10-CM

## 2019-09-23 DIAGNOSIS — I10 Essential (primary) hypertension: Secondary | ICD-10-CM

## 2019-09-23 DIAGNOSIS — H25813 Combined forms of age-related cataract, bilateral: Secondary | ICD-10-CM

## 2019-09-23 DIAGNOSIS — H35033 Hypertensive retinopathy, bilateral: Secondary | ICD-10-CM

## 2019-09-23 DIAGNOSIS — H43393 Other vitreous opacities, bilateral: Secondary | ICD-10-CM

## 2019-09-27 DIAGNOSIS — I130 Hypertensive heart and chronic kidney disease with heart failure and stage 1 through stage 4 chronic kidney disease, or unspecified chronic kidney disease: Secondary | ICD-10-CM | POA: Diagnosis not present

## 2019-09-27 DIAGNOSIS — N183 Chronic kidney disease, stage 3 unspecified: Secondary | ICD-10-CM | POA: Diagnosis not present

## 2019-09-27 DIAGNOSIS — I5023 Acute on chronic systolic (congestive) heart failure: Secondary | ICD-10-CM | POA: Diagnosis not present

## 2019-09-27 DIAGNOSIS — E1122 Type 2 diabetes mellitus with diabetic chronic kidney disease: Secondary | ICD-10-CM | POA: Diagnosis not present

## 2019-10-14 DIAGNOSIS — H26493 Other secondary cataract, bilateral: Secondary | ICD-10-CM | POA: Diagnosis not present

## 2019-10-14 DIAGNOSIS — Z961 Presence of intraocular lens: Secondary | ICD-10-CM | POA: Diagnosis not present

## 2019-10-16 DIAGNOSIS — E1122 Type 2 diabetes mellitus with diabetic chronic kidney disease: Secondary | ICD-10-CM | POA: Diagnosis not present

## 2019-10-16 DIAGNOSIS — E1140 Type 2 diabetes mellitus with diabetic neuropathy, unspecified: Secondary | ICD-10-CM | POA: Diagnosis not present

## 2019-10-16 DIAGNOSIS — E782 Mixed hyperlipidemia: Secondary | ICD-10-CM | POA: Diagnosis not present

## 2019-10-16 DIAGNOSIS — E7800 Pure hypercholesterolemia, unspecified: Secondary | ICD-10-CM | POA: Diagnosis not present

## 2019-10-16 DIAGNOSIS — K2100 Gastro-esophageal reflux disease with esophagitis, without bleeding: Secondary | ICD-10-CM | POA: Diagnosis not present

## 2019-10-21 DIAGNOSIS — D649 Anemia, unspecified: Secondary | ICD-10-CM | POA: Diagnosis not present

## 2019-10-21 DIAGNOSIS — I25119 Atherosclerotic heart disease of native coronary artery with unspecified angina pectoris: Secondary | ICD-10-CM | POA: Diagnosis not present

## 2019-10-21 DIAGNOSIS — I5023 Acute on chronic systolic (congestive) heart failure: Secondary | ICD-10-CM | POA: Diagnosis not present

## 2019-10-21 DIAGNOSIS — I10 Essential (primary) hypertension: Secondary | ICD-10-CM | POA: Diagnosis not present

## 2019-10-21 DIAGNOSIS — R4582 Worries: Secondary | ICD-10-CM | POA: Diagnosis not present

## 2019-10-29 DIAGNOSIS — N183 Chronic kidney disease, stage 3 unspecified: Secondary | ICD-10-CM | POA: Diagnosis not present

## 2019-10-29 DIAGNOSIS — I130 Hypertensive heart and chronic kidney disease with heart failure and stage 1 through stage 4 chronic kidney disease, or unspecified chronic kidney disease: Secondary | ICD-10-CM | POA: Diagnosis not present

## 2019-10-29 DIAGNOSIS — E1122 Type 2 diabetes mellitus with diabetic chronic kidney disease: Secondary | ICD-10-CM | POA: Diagnosis not present

## 2019-10-29 DIAGNOSIS — I5023 Acute on chronic systolic (congestive) heart failure: Secondary | ICD-10-CM | POA: Diagnosis not present

## 2019-10-30 DIAGNOSIS — J069 Acute upper respiratory infection, unspecified: Secondary | ICD-10-CM | POA: Diagnosis not present

## 2019-11-25 ENCOUNTER — Ambulatory Visit: Payer: Medicare Other | Admitting: Cardiovascular Disease

## 2019-11-25 ENCOUNTER — Other Ambulatory Visit: Payer: Self-pay

## 2019-11-25 ENCOUNTER — Encounter: Payer: Self-pay | Admitting: Cardiovascular Disease

## 2019-11-25 VITALS — BP 112/56 | HR 98 | Ht 63.0 in | Wt 169.0 lb

## 2019-11-25 DIAGNOSIS — I25119 Atherosclerotic heart disease of native coronary artery with unspecified angina pectoris: Secondary | ICD-10-CM

## 2019-11-25 DIAGNOSIS — I442 Atrioventricular block, complete: Secondary | ICD-10-CM

## 2019-11-25 DIAGNOSIS — I5042 Chronic combined systolic (congestive) and diastolic (congestive) heart failure: Secondary | ICD-10-CM

## 2019-11-25 DIAGNOSIS — I4819 Other persistent atrial fibrillation: Secondary | ICD-10-CM

## 2019-11-25 NOTE — Patient Instructions (Signed)
Medication Instructions:  Your provider recommends that you continue on your current medications as directed. Please refer to the Current Medication list given to you today.   *If you need a refill on your cardiac medications before your next appointment, please call your pharmacy*  Follow-Up: At [facility], you and your health needs are our priority.  As part of our continuing mission to provide you with exceptional heart care, we have created designated Provider Care Teams.  These Care Teams include your primary Cardiologist (physician) and Advanced Practice Providers (APPs -  Physician Assistants and Nurse Practitioners) who all work together to provide you with the care you need, when you need it. Your next appointment:   6 month(s) The format for your next appointment:   In Person Provider:   You may see Sherren Mocha, MD or one of the following Advanced Practice Providers on your designated Care Team:    Richardson Dopp, PA-C  Vin Dudley, [place]

## 2019-11-25 NOTE — Progress Notes (Signed)
Cardiology Office Note:    Date:  11/25/2019   ID:  Makina, Skow 10/21/1950, MRN [id number]  PCP:  [name], MD  [facility] Cardiologist:  Sherren Mocha, MD  [place] Electrophysiologist:  None   Referring MD: [name], MD   Chief Complaint  Patient presents with  . Shortness of Breath    History of Present Illness:    JORITA BOHANON is a 69 y.o. female with a hx of:  Coronary artery disease s/p CABG in 2001 ? S/p multiple PCI procedures ? Chronic angina  Non-alcoholic cirrhosis ? Esophageal varices ? Thrombocytopenia (04/2018: PLT 85Y)  Chronic systolic CHF EF 85-02 ? Admx in 3/19 with CHF >>followed with Dr. Haroldine Laws until 6/19 in [facility]  ? EF 40-45 in 10/2017 ? admx in 03/2018 with CHF, worsening anemia >> Tx with PRBCs ? Echo 04/2019: EF 50-55  Diabetes mellitus  Atrial Flutter  Persistent AFib ? Not a candidate foranticoagulationdue to cirrhosis/varices  Accelerated idioventricular rhythm ? Beta-blocker DC  Hypertension   Hyperlipidemia  COPD  Peripheral Arterial Disease  ? CT 04/2019: severe distal abdominal aorta and prox bilat CIA stenoses  The patient is here alone today.  She has been doing pretty well.  She has lost a significant amount of weight.  Her chronic medical problems are essentially unchanged.  She has had no recent anginal symptoms.  Shortness of breath is stable without significant change.  No orthopnea or PND.  No leg swelling.  She never wore her event monitor and she mailed it back because she did not feel like she was having any problems at the time.  Past Medical History:  Diagnosis Date  . Cataract    OU  . Chronic combined systolic and diastolic CHF (congestive heart failure) ([place])   . Cirrhosis of liver ([place])   . CKD (chronic kidney disease), stage III   . Coronary artery disease    a. s/p CABG 2001 w/ (LIMA-OM, SVG-D1, SVG-RCA). b. h/o multiple PCIs per Dr. Antionette Char note.  . Depression    . Esophageal varices ([place])    New 2013  . Gastroesophageal reflux disease   . History of pneumonia   . Hyperlipidemia   . Hypertension   . Hypertensive retinopathy    OU  . Obesity   . Osteoarthritis   . Pancytopenia ([place])   . Paroxysmal atrial flutter ([place])    a. dx 05/2016.  [name] Persistent atrial fibrillation ([place])    a. reported in hosp 07/2016, not on anticoag due to cirrhosis and liver disease, low platelets, varices.  . Thrombocytopenia ([place])     Past Surgical History:  Procedure Laterality Date  . ABDOMINAL HYSTERECTOMY    . BACK SURGERY    . CARDIAC CATHETERIZATION  2004   left internal mammary artery to the obtuse marginal  was found to be small and thread like.  The two grafts were patent.  The left circumflex had 90% in-stent restenosis and cutting balloon angioplasty was performed followed by placement of a 3.0 x 41m Taxus drug -eluting stent.    .Marland KitchenCARDIAC CATHETERIZATION  2006   There was in-stent restenosis in the left circumflex and this was treated with cutting balloon angioplasty   . CARDIAC CATHETERIZATION  2008   vein graft to the to the obtuse marginal was patent, although small, left circumflex had 40% in-stent restenosis, ejection fraction 40-45%.  The patient was medically mananged.  .Marland KitchenCARDIAC CATHETERIZATION N/A 01/09/2015  Procedure: Left Heart Cath and Cors/Grafts Angiography;  Surgeon: Belva Crome, MD;  Location: [facility];  Service: Cardiovascular;  Laterality: N/A;  . CATARACT EXTRACTION W/PHACO Right 05/17/2019   Procedure: CATARACT EXTRACTION PHACO AND INTRAOCULAR LENS PLACEMENT (IOC) (CDE: 14.99);  Surgeon: Baruch Goldmann, MD;  Location: [facility];  Service: Ophthalmology;  Laterality: Right;  . CATARACT EXTRACTION W/PHACO Left 06/10/2019   Procedure: CATARACT EXTRACTION PHACO AND INTRAOCULAR LENS PLACEMENT (IOC);  Surgeon: Baruch Goldmann, MD;  Location: [facility];  Service: Ophthalmology;  Laterality: Left;  CDE: 11.96  . cataract sx    .  COLONOSCOPY  03/29/2011   Procedure: COLONOSCOPY;  Surgeon: Jamesetta So, MD;  [facility];  Service: Gastroenterology;  Laterality: N/A;  . CORONARY ARTERY BYPASS GRAFT  May 31,2001   x 3 with a vein graft to the first diagonal, vein graft to the right coronary  artery, and a free left internal mammary  artery to the obtuse marginal   . ESOPHAGEAL BANDING N/A 07/04/2012   Procedure: ESOPHAGEAL BANDING;  Surgeon: Rogene Houston, MD;  [facility];  Service: Endoscopy;  Laterality: N/A;  . ESOPHAGEAL BANDING N/A 09/17/2012   Procedure: ESOPHAGEAL BANDING;  Surgeon: Rogene Houston, MD;  [facility];  Service: Endoscopy;  Laterality: N/A;  . ESOPHAGEAL BANDING N/A 10/22/2013   Procedure: ESOPHAGEAL BANDING;  Surgeon: Rogene Houston, MD;  [facility];  Service: Endoscopy;  Laterality: N/A;  . ESOPHAGEAL BANDING N/A 11/28/2014   Procedure: ESOPHAGEAL BANDING;  Surgeon: Rogene Houston, MD;  [facility];  Service: Endoscopy;  Laterality: N/A;  . ESOPHAGEAL BANDING N/A 10/29/2015   Procedure: ESOPHAGEAL BANDING;  Surgeon: Rogene Houston, MD;  [facility];  Service: Endoscopy;  Laterality: N/A;  . ESOPHAGOGASTRODUODENOSCOPY  12/20/2011   Procedure: ESOPHAGOGASTRODUODENOSCOPY (EGD);  Surgeon: Jamesetta So, MD;  [facility];  Service: Gastroenterology;  Laterality: N/A;  . ESOPHAGOGASTRODUODENOSCOPY N/A 07/04/2012   Procedure: ESOPHAGOGASTRODUODENOSCOPY (EGD);  Surgeon: Rogene Houston, MD;  [facility];  Service: Endoscopy;  Laterality: N/A;  235-moved to 255 Ann to notify pt  . ESOPHAGOGASTRODUODENOSCOPY N/A 09/17/2012   Procedure: ESOPHAGOGASTRODUODENOSCOPY (EGD);  Surgeon: Rogene Houston, MD;  [facility];  Service: Endoscopy;  Laterality: N/A;  730  . ESOPHAGOGASTRODUODENOSCOPY N/A 10/22/2013   Procedure: ESOPHAGOGASTRODUODENOSCOPY (EGD);  Surgeon: Rogene Houston, MD;  [facility];   Service: Endoscopy;  Laterality: N/A;  730  . ESOPHAGOGASTRODUODENOSCOPY N/A 11/28/2014   Procedure: ESOPHAGOGASTRODUODENOSCOPY (EGD);  Surgeon: Rogene Houston, MD;  [facility];  Service: Endoscopy;  Laterality: N/A;  1:25  . ESOPHAGOGASTRODUODENOSCOPY N/A 10/29/2015   Procedure: ESOPHAGOGASTRODUODENOSCOPY (EGD);  Surgeon: Rogene Houston, MD;  [facility];  Service: Endoscopy;  Laterality: N/A;  12:00  . ESOPHAGOGASTRODUODENOSCOPY N/A 12/12/2016   Grade 1 and 2 varices in lower third of esophagus, post variceal banding scar distal esophagus, mild portal hypertensive gastropathy, Type 1 gastroesophageal varices without bleeding, normal duodenum and second portion of duodenum.   [name] FLEXIBLE SIGMOIDOSCOPY N/A 03/20/2017   external hemorrhoids  . JOINT REPLACEMENT    . LEFT HEART CATH AND CORS/GRAFTS ANGIOGRAPHY N/A 08/15/2016   Procedure: Left Heart Cath and Cors/Grafts Angiography;  Surgeon: Jettie Booze, MD;  Location: [facility];  Service: Cardiovascular;  Laterality: N/A;  . LEFT HEART CATHETERIZATION WITH CORONARY/GRAFT ANGIOGRAM N/A 12/25/2013   Procedure: LEFT HEART CATHETERIZATION  WITH Beatrix Fetters;  Surgeon: Blane Ohara, MD;  Location: [facility];  Service: Cardiovascular;  Laterality: N/A;  . RIGHT HEART CATH N/A 05/29/2017   Procedure: RIGHT HEART CATH;  Surgeon: Jolaine Artist, MD;  Location: [facility];  Service: Cardiovascular;  Laterality: N/A;  . rotator cuff left  2007  . TONSILLECTOMY      Current Medications: Current Meds  Medication Sig  . albuterol (PROVENTIL HFA;VENTOLIN HFA) 108 (90 BASE) MCG/ACT inhaler Inhale 2 puffs into the lungs every 6 (six) hours as needed for wheezing or shortness of breath.   . ezetimibe (ZETIA) 10 MG tablet Take 10 mg by mouth at bedtime.   [name] FLUoxetine (PROZAC) 20 MG capsule Take 20 mg by mouth daily.   . isosorbide mononitrate (IMDUR) 60 MG 24 hr tablet Take 1.5 tablets (90 mg  total) by mouth daily.  [name] NITROSTAT 0.4 MG SL tablet Place 0.4 mg under the tongue every 5 (five) minutes as needed for chest pain (x 3 tabs daily).   . Oxycodone HCl 10 MG TABS Take 10 mg by mouth 4 (four) times daily. Patient states that she takes as needed for Pain.  . pantoprazole (PROTONIX) 40 MG tablet Take 40 mg by mouth daily.  . polyethylene glycol (MIRALAX / GLYCOLAX) packet Take 17 g by mouth daily.  [name] spironolactone (ALDACTONE) 25 MG tablet Take 1 tablet (25 mg total) by mouth daily.  [name] torsemide (DEMADEX) 20 MG tablet Take 2 tablets (40 mg total) by mouth daily.  . [DISCONTINUED] Colchicine 0.6 MG CAPS Take 1 capsule by mouth at bedtime.  . [DISCONTINUED] hydroxypropyl methylcellulose / hypromellose (ISOPTO TEARS / GONIOVISC) 2.5 % ophthalmic solution Place 1 drop into both eyes 3 (three) times daily as needed for dry eyes.     Allergies:   Entresto [sacubitril-valsartan], Acetaminophen, Oxycodone, Ace inhibitors, Cefaclor, Cephalexin, Penicillins, Pregabalin, and Tape   Social History   Socioeconomic History  . Marital status: Divorced    Spouse name: Not on file  . Number of children: Not on file  . Years of education: Not on file  . Highest education level: Not on file  Occupational History  . Occupation: Disabled  Tobacco Use  . Smoking status: Former Smoker    Packs/day: 0.50    Years: 20.00    Pack years: 10.00    Types: Cigarettes    Quit date: 07/21/1995    Years since quitting: 24.3  . Smokeless tobacco: Never Used  Vaping Use  . Vaping Use: Never used  Substance and Sexual Activity  . Alcohol use: No  . Drug use: No  . Sexual activity: Not on file  Other Topics Concern  . Not on file  Social History Narrative   Lives in [place] by herself.     Social Determinants of Health   Financial Resource Strain:   . Difficulty of Paying Living Expenses: Not on file  Food Insecurity:   . Worried About Charity fundraiser in the Last Year: Not on file  . Ran Out  of Food in the Last Year: Not on file  Transportation Needs:   . Lack of Transportation (Medical): Not on file  . Lack of Transportation (Non-Medical): Not on file  Physical Activity:   . Days of Exercise per Week: Not on file  . Minutes of Exercise per Session: Not on file  Stress:   . Feeling of Stress : Not on file  Social Connections:   . Frequency  of Communication with Friends and Family: Not on file  . Frequency of Social Gatherings with Friends and Family: Not on file  . Attends Religious Services: Not on file  . Active Member of Clubs or Organizations: Not on file  . Attends Archivist Meetings: Not on file  . Marital Status: Not on file     Family History: The patient's family history includes Coronary artery disease in her sister; Diabetes in her brother, father, mother, and sister; Glaucoma in her sister; Heart attack in her mother; Heart attack (age of onset: 52) in her father. There is no history of Colon cancer.  ROS:   Please see the history of present illness.    All other systems reviewed and are negative.  EKGs/Labs/Other Studies Reviewed:    The following studies were reviewed today: 2D Echo 05-20-2019: IMPRESSIONS    1. Left ventricular ejection fraction, by estimation, is 50 to 55%. The  left ventricle has low normal function. The left ventricle has no regional  wall motion abnormalities. There is mild left ventricular hypertrophy.  Left ventricular diastolic  parameters are indeterminate.  2. Right ventricular systolic function is low normal. The right  ventricular size is normal. There is normal pulmonary artery systolic  pressure. The estimated right ventricular systolic pressure is 00.7 mmHg.  3. Left atrial size was moderately dilated.  4. Right atrial size was mildly dilated.  5. The mitral valve is grossly normal. Mild mitral valve regurgitation.  6. Tricuspid valve regurgitation is mild to moderate.  7. The aortic valve is  tricuspid. Aortic valve regurgitation is not  visualized.  8. The inferior vena cava is normal in size with greater than 50%  respiratory variability, suggesting right atrial pressure of 3 mmHg.   EKG:  EKG is not ordered today.    Recent Labs: 05/23/2019: ALT 12; B Natriuretic Peptide 134.0 05/24/2019: BUN 13; Creatinine, Ser 0.99; Hemoglobin 11.4; Magnesium 1.6; Platelets 44; Potassium 3.5; Sodium 142  Recent Lipid Panel    Component Value Date/Time   CHOL 111 05/26/2017 0637   TRIG 93 05/26/2017 0637   HDL 38 (L) 05/26/2017 0637   CHOLHDL 2.9 05/26/2017 0637   VLDL 19 05/26/2017 0637   LDLCALC 54 05/26/2017 0637    Physical Exam:    VS:  BP (!) 112/56   Pulse 98   Ht 5' 3"  (1.6 m)   Wt 169 lb (76.7 kg)   LMP 03/29/2011   SpO2 95%   BMI 29.94 kg/m     Wt Readings from Last 3 Encounters:  11/25/19 169 lb (76.7 kg)  08/06/19 179 lb 3.2 oz (81.3 kg)  07/26/19 181 lb (82.1 kg)     GEN:  Well nourished, well developed in no acute distress HEENT: Normal NECK: No JVD; No carotid bruits LYMPHATICS: No lymphadenopathy CARDIAC: RRR, 2/6 ejection murmur at the right upper sternal border, no diastolic murmur RESPIRATORY:  Clear to auscultation without rales, wheezing or rhonchi  ABDOMEN: Soft, non-tender, non-distended MUSCULOSKELETAL:  No edema; No deformity  SKIN: Warm and dry NEUROLOGIC:  Alert and oriented x 3 PSYCHIATRIC:  Normal affect   ASSESSMENT:    1. Coronary artery disease involving native coronary artery of native heart with angina pectoris ([place])   2. Chronic combined systolic and diastolic CHF (congestive heart failure) (H[facility])   3. Persistent atrial fibrillation ([place])   4. AIVR (accelerated idioventricular rhythm) ([facility])    PLAN:    In order of problems listed above:  1. Stable  on isosorbide alone.  No antiplatelet therapy in the setting of her varices, thrombocytopenia, and high risk for bleeding.  Doing well at present. 2. Stable volume status on  torsemide and spironolactone. 3. Not a candidate for anticoagulation.  Has been in sinus rhythm without recent documentation of atrial fibrillation. 4. Seen on EKG tracing from May.  Her beta-blocker was stopped.  No clear recurrence.  Follow-up EKG a few weeks later showed sinus rhythm.    Medication Adjustments/Labs and Tests Ordered: Current medicines are reviewed at length with the patient today.  Concerns regarding medicines are outlined above.  No orders of the defined types were placed in this encounter.  No orders of the defined types were placed in this encounter.   Patient Instructions  Medication Instructions:  Your provider recommends that you continue on your current medications as directed. Please refer to the Current Medication list given to you today.   *If you need a refill on your cardiac medications before your next appointment, please call your pharmacy*  Follow-Up: At [facility], you and your health needs are our priority.  As part of our continuing mission to provide you with exceptional heart care, we have created designated Provider Care Teams.  These Care Teams include your primary Cardiologist (physician) and Advanced Practice Providers (APPs -  Physician Assistants and Nurse Practitioners) who all work together to provide you with the care you need, when you need it. Your next appointment:   6 month(s) The format for your next appointment:   In Person Provider:   You may see Sherren Mocha, MD or one of the following Advanced Practice Providers on your designated Care Team:    Richardson Dopp, PA-C  Robbie Lis, [place]      Signed, Sherren Mocha, MD  11/25/2019 10:48 AM    [place]

## 2019-11-28 DIAGNOSIS — E1122 Type 2 diabetes mellitus with diabetic chronic kidney disease: Secondary | ICD-10-CM | POA: Diagnosis not present

## 2019-11-28 DIAGNOSIS — I130 Hypertensive heart and chronic kidney disease with heart failure and stage 1 through stage 4 chronic kidney disease, or unspecified chronic kidney disease: Secondary | ICD-10-CM | POA: Diagnosis not present

## 2019-11-28 DIAGNOSIS — N183 Chronic kidney disease, stage 3 unspecified: Secondary | ICD-10-CM | POA: Diagnosis not present

## 2019-11-28 DIAGNOSIS — I5023 Acute on chronic systolic (congestive) heart failure: Secondary | ICD-10-CM | POA: Diagnosis not present

## 2019-12-16 DIAGNOSIS — Z961 Presence of intraocular lens: Secondary | ICD-10-CM | POA: Diagnosis not present

## 2019-12-16 DIAGNOSIS — H26493 Other secondary cataract, bilateral: Secondary | ICD-10-CM | POA: Diagnosis not present

## 2020-01-23 ENCOUNTER — Inpatient Hospital Stay (HOSPITAL_COMMUNITY)
Admission: EM | Admit: 2020-01-23 | Discharge: 2020-01-28 | DRG: 391 | Disposition: A | Discharge status: Home / Self Care | Payer: Medicare Other | Attending: Internal Medicine | Admitting: Internal Medicine

## 2020-01-23 ENCOUNTER — Encounter (HOSPITAL_COMMUNITY): Payer: Self-pay | Admitting: Emergency Medicine

## 2020-01-23 ENCOUNTER — Other Ambulatory Visit: Payer: Self-pay

## 2020-01-23 ENCOUNTER — Emergency Department (HOSPITAL_COMMUNITY): Payer: Medicare Other

## 2020-01-23 DIAGNOSIS — I10 Essential (primary) hypertension: Secondary | ICD-10-CM | POA: Diagnosis present

## 2020-01-23 DIAGNOSIS — I130 Hypertensive heart and chronic kidney disease with heart failure and stage 1 through stage 4 chronic kidney disease, or unspecified chronic kidney disease: Secondary | ICD-10-CM | POA: Diagnosis present

## 2020-01-23 DIAGNOSIS — R911 Solitary pulmonary nodule: Secondary | ICD-10-CM | POA: Diagnosis present

## 2020-01-23 DIAGNOSIS — K649 Unspecified hemorrhoids: Secondary | ICD-10-CM | POA: Diagnosis present

## 2020-01-23 DIAGNOSIS — I447 Left bundle-branch block, unspecified: Secondary | ICD-10-CM | POA: Diagnosis present

## 2020-01-23 DIAGNOSIS — K7469 Other cirrhosis of liver: Secondary | ICD-10-CM | POA: Diagnosis not present

## 2020-01-23 DIAGNOSIS — I480 Paroxysmal atrial fibrillation: Secondary | ICD-10-CM | POA: Diagnosis not present

## 2020-01-23 DIAGNOSIS — D696 Thrombocytopenia, unspecified: Secondary | ICD-10-CM | POA: Diagnosis not present

## 2020-01-23 DIAGNOSIS — I4819 Other persistent atrial fibrillation: Secondary | ICD-10-CM | POA: Diagnosis present

## 2020-01-23 DIAGNOSIS — I5042 Chronic combined systolic (congestive) and diastolic (congestive) heart failure: Secondary | ICD-10-CM | POA: Diagnosis not present

## 2020-01-23 DIAGNOSIS — I255 Ischemic cardiomyopathy: Secondary | ICD-10-CM | POA: Diagnosis present

## 2020-01-23 DIAGNOSIS — Z880 Allergy status to penicillin: Secondary | ICD-10-CM

## 2020-01-23 DIAGNOSIS — E871 Hypo-osmolality and hyponatremia: Secondary | ICD-10-CM | POA: Diagnosis present

## 2020-01-23 DIAGNOSIS — R0789 Other chest pain: Secondary | ICD-10-CM | POA: Diagnosis present

## 2020-01-23 DIAGNOSIS — Z20822 Contact with and (suspected) exposure to covid-19: Secondary | ICD-10-CM | POA: Diagnosis present

## 2020-01-23 DIAGNOSIS — K7581 Nonalcoholic steatohepatitis (NASH): Secondary | ICD-10-CM | POA: Diagnosis not present

## 2020-01-23 DIAGNOSIS — R7989 Other specified abnormal findings of blood chemistry: Secondary | ICD-10-CM

## 2020-01-23 DIAGNOSIS — N183 Chronic kidney disease, stage 3 unspecified: Secondary | ICD-10-CM | POA: Diagnosis not present

## 2020-01-23 DIAGNOSIS — I81 Portal vein thrombosis: Secondary | ICD-10-CM | POA: Diagnosis not present

## 2020-01-23 DIAGNOSIS — J9601 Acute respiratory failure with hypoxia: Secondary | ICD-10-CM | POA: Diagnosis not present

## 2020-01-23 DIAGNOSIS — I8510 Secondary esophageal varices without bleeding: Secondary | ICD-10-CM | POA: Diagnosis not present

## 2020-01-23 DIAGNOSIS — N1831 Chronic kidney disease, stage 3a: Secondary | ICD-10-CM | POA: Diagnosis present

## 2020-01-23 DIAGNOSIS — R079 Chest pain, unspecified: Secondary | ICD-10-CM | POA: Diagnosis not present

## 2020-01-23 DIAGNOSIS — R778 Other specified abnormalities of plasma proteins: Secondary | ICD-10-CM | POA: Diagnosis not present

## 2020-01-23 DIAGNOSIS — E1122 Type 2 diabetes mellitus with diabetic chronic kidney disease: Secondary | ICD-10-CM | POA: Diagnosis not present

## 2020-01-23 DIAGNOSIS — E7220 Disorder of urea cycle metabolism, unspecified: Secondary | ICD-10-CM

## 2020-01-23 DIAGNOSIS — F32A Depression, unspecified: Secondary | ICD-10-CM | POA: Diagnosis present

## 2020-01-23 DIAGNOSIS — K766 Portal hypertension: Secondary | ICD-10-CM | POA: Diagnosis present

## 2020-01-23 DIAGNOSIS — I25119 Atherosclerotic heart disease of native coronary artery with unspecified angina pectoris: Secondary | ICD-10-CM | POA: Diagnosis present

## 2020-01-23 DIAGNOSIS — R9431 Abnormal electrocardiogram [ECG] [EKG]: Secondary | ICD-10-CM

## 2020-01-23 DIAGNOSIS — I8500 Esophageal varices without bleeding: Secondary | ICD-10-CM

## 2020-01-23 DIAGNOSIS — K529 Noninfective gastroenteritis and colitis, unspecified: Secondary | ICD-10-CM | POA: Diagnosis not present

## 2020-01-23 DIAGNOSIS — Z87891 Personal history of nicotine dependence: Secondary | ICD-10-CM

## 2020-01-23 DIAGNOSIS — I2510 Atherosclerotic heart disease of native coronary artery without angina pectoris: Secondary | ICD-10-CM | POA: Diagnosis present

## 2020-01-23 DIAGNOSIS — H35039 Hypertensive retinopathy, unspecified eye: Secondary | ICD-10-CM | POA: Diagnosis present

## 2020-01-23 DIAGNOSIS — I361 Nonrheumatic tricuspid (valve) insufficiency: Secondary | ICD-10-CM | POA: Diagnosis not present

## 2020-01-23 DIAGNOSIS — E876 Hypokalemia: Secondary | ICD-10-CM | POA: Diagnosis present

## 2020-01-23 DIAGNOSIS — M1990 Unspecified osteoarthritis, unspecified site: Secondary | ICD-10-CM | POA: Diagnosis present

## 2020-01-23 DIAGNOSIS — R109 Unspecified abdominal pain: Secondary | ICD-10-CM | POA: Diagnosis present

## 2020-01-23 DIAGNOSIS — I083 Combined rheumatic disorders of mitral, aortic and tricuspid valves: Secondary | ICD-10-CM | POA: Diagnosis present

## 2020-01-23 DIAGNOSIS — K922 Gastrointestinal hemorrhage, unspecified: Secondary | ICD-10-CM

## 2020-01-23 DIAGNOSIS — M7989 Other specified soft tissue disorders: Secondary | ICD-10-CM

## 2020-01-23 DIAGNOSIS — Z79899 Other long term (current) drug therapy: Secondary | ICD-10-CM

## 2020-01-23 DIAGNOSIS — K6289 Other specified diseases of anus and rectum: Secondary | ICD-10-CM | POA: Diagnosis present

## 2020-01-23 DIAGNOSIS — I517 Cardiomegaly: Secondary | ICD-10-CM | POA: Diagnosis not present

## 2020-01-23 DIAGNOSIS — Z90710 Acquired absence of both cervix and uterus: Secondary | ICD-10-CM

## 2020-01-23 DIAGNOSIS — Z888 Allergy status to other drugs, medicaments and biological substances status: Secondary | ICD-10-CM

## 2020-01-23 DIAGNOSIS — R188 Other ascites: Secondary | ICD-10-CM | POA: Diagnosis present

## 2020-01-23 DIAGNOSIS — I4892 Unspecified atrial flutter: Secondary | ICD-10-CM | POA: Diagnosis not present

## 2020-01-23 DIAGNOSIS — E782 Mixed hyperlipidemia: Secondary | ICD-10-CM | POA: Diagnosis present

## 2020-01-23 DIAGNOSIS — R112 Nausea with vomiting, unspecified: Secondary | ICD-10-CM | POA: Diagnosis not present

## 2020-01-23 DIAGNOSIS — E860 Dehydration: Secondary | ICD-10-CM | POA: Diagnosis present

## 2020-01-23 DIAGNOSIS — K219 Gastro-esophageal reflux disease without esophagitis: Secondary | ICD-10-CM | POA: Diagnosis not present

## 2020-01-23 DIAGNOSIS — Z885 Allergy status to narcotic agent status: Secondary | ICD-10-CM

## 2020-01-23 DIAGNOSIS — J449 Chronic obstructive pulmonary disease, unspecified: Secondary | ICD-10-CM | POA: Diagnosis present

## 2020-01-23 DIAGNOSIS — I959 Hypotension, unspecified: Secondary | ICD-10-CM | POA: Diagnosis present

## 2020-01-23 DIAGNOSIS — Z951 Presence of aortocoronary bypass graft: Secondary | ICD-10-CM

## 2020-01-23 DIAGNOSIS — Z886 Allergy status to analgesic agent status: Secondary | ICD-10-CM

## 2020-01-23 DIAGNOSIS — Z8249 Family history of ischemic heart disease and other diseases of the circulatory system: Secondary | ICD-10-CM | POA: Diagnosis not present

## 2020-01-23 DIAGNOSIS — R1084 Generalized abdominal pain: Secondary | ICD-10-CM

## 2020-01-23 DIAGNOSIS — D61818 Other pancytopenia: Secondary | ICD-10-CM | POA: Diagnosis not present

## 2020-01-23 DIAGNOSIS — R072 Precordial pain: Secondary | ICD-10-CM | POA: Diagnosis not present

## 2020-01-23 DIAGNOSIS — Z83511 Family history of glaucoma: Secondary | ICD-10-CM

## 2020-01-23 DIAGNOSIS — I5023 Acute on chronic systolic (congestive) heart failure: Secondary | ICD-10-CM | POA: Diagnosis not present

## 2020-01-23 DIAGNOSIS — Z833 Family history of diabetes mellitus: Secondary | ICD-10-CM

## 2020-01-23 DIAGNOSIS — E669 Obesity, unspecified: Secondary | ICD-10-CM | POA: Diagnosis present

## 2020-01-23 DIAGNOSIS — Z8701 Personal history of pneumonia (recurrent): Secondary | ICD-10-CM

## 2020-01-23 DIAGNOSIS — K7460 Unspecified cirrhosis of liver: Secondary | ICD-10-CM | POA: Diagnosis present

## 2020-01-23 DIAGNOSIS — K5900 Constipation, unspecified: Secondary | ICD-10-CM | POA: Diagnosis present

## 2020-01-23 DIAGNOSIS — F419 Anxiety disorder, unspecified: Secondary | ICD-10-CM | POA: Diagnosis present

## 2020-01-23 DIAGNOSIS — R197 Diarrhea, unspecified: Secondary | ICD-10-CM | POA: Diagnosis not present

## 2020-01-23 DIAGNOSIS — I340 Nonrheumatic mitral (valve) insufficiency: Secondary | ICD-10-CM | POA: Diagnosis not present

## 2020-01-23 DIAGNOSIS — K7689 Other specified diseases of liver: Secondary | ICD-10-CM | POA: Diagnosis not present

## 2020-01-23 LAB — COMPREHENSIVE METABOLIC PANEL
ALT: 24 U/L (ref 0–44)
AST: 86 U/L — ABNORMAL HIGH (ref 15–41)
Albumin: 3.1 g/dL — ABNORMAL LOW (ref 3.5–5.0)
Alkaline Phosphatase: 94 U/L (ref 38–126)
Anion gap: 13 (ref 5–15)
BUN: 13 mg/dL (ref 8–23)
CO2: 28 mmol/L (ref 22–32)
Calcium: 9.2 mg/dL (ref 8.9–10.3)
Chloride: 89 mmol/L — ABNORMAL LOW (ref 98–111)
Creatinine, Ser: 1.15 mg/dL — ABNORMAL HIGH (ref 0.44–1.00)
GFR, Estimated: 51 mL/min — ABNORMAL LOW (ref >60)
Glucose, Bld: 103 mg/dL — ABNORMAL HIGH (ref 70–99)
Potassium: 3.5 mmol/L (ref 3.5–5.1)
Sodium: 130 mmol/L — ABNORMAL LOW (ref 135–145)
Total Bilirubin: 6.7 mg/dL — ABNORMAL HIGH (ref 0.3–1.2)
Total Protein: 7.5 g/dL (ref 6.5–8.1)

## 2020-01-23 LAB — CBC
HCT: 42.9 % (ref 36.0–46.0)
Hemoglobin: 14.5 g/dL (ref 12.0–15.0)
MCH: 31.9 pg (ref 26.0–34.0)
MCHC: 33.8 g/dL (ref 30.0–36.0)
MCV: 94.5 fL (ref 80.0–100.0)
Platelets: 68 10*3/uL — ABNORMAL LOW (ref 150–400)
RBC: 4.54 MIL/uL (ref 3.87–5.11)
RDW: 17.4 % — ABNORMAL HIGH (ref 11.5–15.5)
WBC: 7.4 10*3/uL (ref 4.0–10.5)
nRBC: 0 % (ref 0.0–0.2)

## 2020-01-23 LAB — RESP PANEL BY RT-PCR (FLU A&B, COVID) ARPGX2
Influenza A by PCR: NEGATIVE
Influenza B by PCR: NEGATIVE
SARS Coronavirus 2 by RT PCR: NEGATIVE

## 2020-01-23 LAB — BRAIN NATRIURETIC PEPTIDE: B Natriuretic Peptide: 175 pg/mL — ABNORMAL HIGH (ref 0.0–100.0)

## 2020-01-23 LAB — TROPONIN I (HIGH SENSITIVITY)
Troponin I (High Sensitivity): 40 ng/L — ABNORMAL HIGH (ref <18)
Troponin I (High Sensitivity): 39 ng/L — ABNORMAL HIGH (ref <18)

## 2020-01-23 LAB — LIPASE, BLOOD: Lipase: 50 U/L (ref 11–51)

## 2020-01-23 LAB — AMMONIA: Ammonia: 43 umol/L — ABNORMAL HIGH (ref 9–35)

## 2020-01-23 MED ORDER — SODIUM CHLORIDE 0.9 % IV BOLUS
500.0000 mL | Freq: Once | INTRAVENOUS | Status: AC
  Administered 2020-01-23: 500 mL via INTRAVENOUS

## 2020-01-23 MED ORDER — PANTOPRAZOLE SODIUM 40 MG IV SOLR
40.0000 mg | Freq: Once | INTRAVENOUS | Status: AC
  Administered 2020-01-23: 40 mg via INTRAVENOUS
  Filled 2020-01-23: qty 40

## 2020-01-23 MED ORDER — ONDANSETRON HCL 4 MG/2ML IJ SOLN
4.0000 mg | Freq: Once | INTRAMUSCULAR | Status: AC
  Administered 2020-01-23: 4 mg via INTRAVENOUS
  Filled 2020-01-23: qty 2

## 2020-01-23 MED ORDER — DEXTROSE-NACL 5-0.9 % IV SOLN: INTRAVENOUS | Status: DC

## 2020-01-23 MED ORDER — HYDROMORPHONE HCL 1 MG/ML IJ SOLN
1.0000 mg | Freq: Once | INTRAMUSCULAR | Status: AC
  Administered 2020-01-23: 1 mg via INTRAVENOUS
  Filled 2020-01-23: qty 1

## 2020-01-23 NOTE — ED Notes (Signed)
Reports less nausea

## 2020-01-23 NOTE — ED Notes (Signed)
Pt reports she has felt like she was dying since last Sunday when she called her physician and was told to go to the ER  She has not gone until today when she went to her brother's for lunch and felt so badly that he insisted that she come   Here for eval   She has jaundice, hx of cirrhosis, varices   Heart problems   Her liver is hard and palpable   She reports rectal bleeding but attributes it to a hemorrhoid

## 2020-01-23 NOTE — ED Notes (Signed)
Pt asked PA for pain med for her chronic back pain 12/10  She has not informed RN of this   Now reports her pain 4/10

## 2020-01-23 NOTE — ED Triage Notes (Signed)
Pt c/o emesis x 4 days with nausea and diarrhea, pt reports she has had blood in her stools, pt c/o generalized cramps and pain

## 2020-01-23 NOTE — ED Notes (Signed)
Pt appears anxious as evidenced by her being hyperverbal

## 2020-01-23 NOTE — ED Provider Notes (Signed)
[facility] EMERGENCY DEPARTMENT Provider Note   CSN: [id number] Arrival date & time: 01/23/20  1818     History Chief Complaint  Patient presents with  . Emesis    Samantha [name] is a 69 y.o. female with a history significant for CHF, [name] induced liver cirrhosis, CAD with CABG in 2001, esophageal varices, GERD, hypertension, paroxysmal atrial flutter and A. fib presenting with a 5-day history of nausea, vomiting and episodic chest pain.  She denies abdominal pain, but feels a soreness across her abdomen from the constant vomiting and dry heaves.  She denies hematemesis, but does states she has had some rectal bleeding, stating over the past several days she has had on again off again constipation followed by diarrhea.  She endorses pain when she has a hard stool which is associated with bleeding.  She describes a sharp pain in her rectum with these occurrences.  She last saw blood this morning with a bowel movement.  Has been seen in the past for this, has a history of hemorrhoids.  GI specialist Dr. Laural Golden.  She reports chest pain described as pressure that started in her neck and then radiated into her mid chest region occurring last night at rest.  It lasted for more than an hour, she took two nitroglycerin tablets which did not help, then took four Tums and fell asleep.  This morning she had mild residual pressure symptoms but has been resolved for the majority of today.  She does endorse increased bilateral lower extremity edema, states she is not taking her home medications x5 days due to feeling unwell including her torsemide and spironolactone.  HPI     Past Medical History:  Diagnosis Date  . Cataract    OU  . Chronic combined systolic and diastolic CHF (congestive heart failure) ([place])   . Cirrhosis of liver ([place])   . CKD (chronic kidney disease), stage III ([place])   . Coronary artery disease    a. s/p CABG 2001 w/ (LIMA-OM, SVG-D1, SVG-RCA). b. h/o multiple PCIs per Dr.  Antionette Char note.  . Depression   . Esophageal varices ([place])    New 2013  . Gastroesophageal reflux disease   . History of pneumonia   . Hyperlipidemia   . Hypertension   . Hypertensive retinopathy    OU  . Obesity   . Osteoarthritis   . Pancytopenia ([place])   . Paroxysmal atrial flutter ([place])    a. dx 05/2016.  [name] Persistent atrial fibrillation ([place])    a. reported in hosp 07/2016, not on anticoag due to cirrhosis and liver disease, low platelets, varices.  . Thrombocytopenia [facility])     Patient Active Problem List   Diagnosis Date Noted  . Atypical chest pain 08/06/2019  . GERD (gastroesophageal reflux disease) 08/06/2019  . Acute respiratory failure with hypoxia ([place]) 05/23/2019  . Abdominal pain 05/20/2019  . Intractable abdominal pain 05/19/2019  . Paroxysmal atrial fibrillation ([place]) 05/19/2019  . Anxiety with depression 05/19/2019  . Sinus arrhythmia 05/19/2019  . Iron deficiency anemia 02/05/2019  . Chronic combined systolic and diastolic CHF (congestive heart failure) ([place]) 04/17/2018  . Heart failure ([place]) 04/17/2018  . Osteoarthritis of spine with radiculopathy, cervical region 01/12/2018  . Paresthesia of both hands 12/14/2017  . Recurrent falls 12/14/2017  . Closed compression fracture of first lumbar vertebra ([place]) 08/23/2017  . Lumbar spondylosis with myelopathy 08/23/2017  . Carpal tunnel syndrome of right wrist 04/05/2017  . Rectal pain 03/06/2017  . Influenza A (H1N1)  02/22/2017  . Rectal bleeding 02/22/2017  . Closed fracture of lower end of right radius with routine healing 02/20/2017  . Injury of triangular fibrocartilage complex of right wrist 02/20/2017  . Pain 02/10/2017  . Other cirrhosis of liver ([place]) 10/26/2016  . Persistent atrial fibrillation 08/16/2016  . Cardiomyopathy, ischemic-35-40% by cath  01/11/2015  . Obesity 09/24/2013  . Unspecified hereditary and idiopathic peripheral neuropathy 01/10/2013  . Hereditary and idiopathic peripheral  neuropathy 01/10/2013  . Cirrhosis, non-alcoholic ([place]) 50/53/9767  . Esophageal varices- Plavix stopped 12/23/2011  . GI bleed 12/23/2011  . Idiopathic esophageal varices without bleeding ([place]) 12/23/2011  . Anemia 04/22/2011  . CAD (coronary artery disease) 08/21/2009  . Mixed hyperlipidemia 06/04/2008  . Essential hypertension 06/04/2008  . S/P CABG x 3- 2001 06/04/2008    Past Surgical History:  Procedure Laterality Date  . ABDOMINAL HYSTERECTOMY    . BACK SURGERY    . CARDIAC CATHETERIZATION  2004   left internal mammary artery to the obtuse marginal  was found to be small and thread like.  The two grafts were patent.  The left circumflex had 90% in-stent restenosis and cutting balloon angioplasty was performed followed by placement of a 3.0 x 18m Taxus drug -eluting stent.    .Marland KitchenCARDIAC CATHETERIZATION  2006   There was in-stent restenosis in the left circumflex and this was treated with cutting balloon angioplasty   . CARDIAC CATHETERIZATION  2008   vein graft to the to the obtuse marginal was patent, although small, left circumflex had 40% in-stent restenosis, ejection fraction 40-45%.  The patient was medically mananged.  .Marland KitchenCARDIAC CATHETERIZATION N/A 01/09/2015   Procedure: Left Heart Cath and Cors/Grafts Angiography;  Surgeon: HBelva Crome MD;  [facility];  Service: Cardiovascular;  Laterality: N/A;  . CATARACT EXTRACTION W/PHACO Right 05/17/2019   Procedure: CATARACT EXTRACTION PHACO AND INTRAOCULAR LENS PLACEMENT (IOC) (CDE: 14.99);  Surgeon: WBaruch Goldmann MD;  Location: [facility];  Service: Ophthalmology;  Laterality: Right;  . CATARACT EXTRACTION W/PHACO Left 06/10/2019   Procedure: CATARACT EXTRACTION PHACO AND INTRAOCULAR LENS PLACEMENT (IOC);  Surgeon: WBaruch Goldmann MD;  Location: [facility];  Service: Ophthalmology;  Laterality: Left;  CDE: 11.96  . cataract sx    . COLONOSCOPY  03/29/2011   Procedure: COLONOSCOPY;  Surgeon: MJamesetta So MD;   Location: [facility] SU[facility];  Service: Gastroenterology;  Laterality: N/A;  . CORONARY ARTERY BYPASS GRAFT  May 31,2001   x 3 with a vein graft to the first diagonal, vein graft to the right coronary  artery, and a free left internal mammary  artery to the obtuse marginal   . ESOPHAGEAL BANDING N/A 07/04/2012   Procedure: ESOPHAGEAL BANDING;  Surgeon: NRogene Houston MD;  Location: [facility];  Service: Endoscopy;  Laterality: N/A;  . ESOPHAGEAL BANDING N/A 09/17/2012   Procedure: ESOPHAGEAL BANDING;  Surgeon: NRogene Houston MD;  [facility];  Service: Endoscopy;  Laterality: N/A;  . ESOPHAGEAL BANDING N/A 10/22/2013   Procedure: ESOPHAGEAL BANDING;  Surgeon: NRogene Houston MD;  [facility];  Service: Endoscopy;  Laterality: N/A;  . ESOPHAGEAL BANDING N/A 11/28/2014   Procedure: ESOPHAGEAL BANDING;  Surgeon: NRogene Houston MD;  [facility];  Service: Endoscopy;  Laterality: N/A;  . ESOPHAGEAL BANDING N/A 10/29/2015   Procedure: ESOPHAGEAL BANDING;  Surgeon: NRogene Houston MD;  [facility];  Service: Endoscopy;  Laterality: N/A;  . ESOPHAGOGASTRODUODENOSCOPY  12/20/2011  Procedure: ESOPHAGOGASTRODUODENOSCOPY (EGD);  Surgeon: Jamesetta So, MD;  [facility];  Service: Gastroenterology;  Laterality: N/A;  . ESOPHAGOGASTRODUODENOSCOPY N/A 07/04/2012   Procedure: ESOPHAGOGASTRODUODENOSCOPY (EGD);  Surgeon: Rogene Houston, MD;  [facility];  Service: Endoscopy;  Laterality: N/A;  235-moved to 255 Ann to notify pt  . ESOPHAGOGASTRODUODENOSCOPY N/A 09/17/2012   Procedure: ESOPHAGOGASTRODUODENOSCOPY (EGD);  Surgeon: Rogene Houston, MD;  [facility];  Service: Endoscopy;  Laterality: N/A;  730  . ESOPHAGOGASTRODUODENOSCOPY N/A 10/22/2013   Procedure: ESOPHAGOGASTRODUODENOSCOPY (EGD);  Surgeon: Rogene Houston, MD;  [facility];  Service: Endoscopy;  Laterality: N/A;  730  . ESOPHAGOGASTRODUODENOSCOPY N/A  11/28/2014   Procedure: ESOPHAGOGASTRODUODENOSCOPY (EGD);  Surgeon: Rogene Houston, MD;  [facility];  Service: Endoscopy;  Laterality: N/A;  1:25  . ESOPHAGOGASTRODUODENOSCOPY N/A 10/29/2015   Procedure: ESOPHAGOGASTRODUODENOSCOPY (EGD);  Surgeon: Rogene Houston, MD;  [facility];  Service: Endoscopy;  Laterality: N/A;  12:00  . ESOPHAGOGASTRODUODENOSCOPY N/A 12/12/2016   Grade 1 and 2 varices in lower third of esophagus, post variceal banding scar distal esophagus, mild portal hypertensive gastropathy, Type 1 gastroesophageal varices without bleeding, normal duodenum and second portion of duodenum.   [name] FLEXIBLE SIGMOIDOSCOPY N/A 03/20/2017   external hemorrhoids  . JOINT REPLACEMENT    . LEFT HEART CATH AND CORS/GRAFTS ANGIOGRAPHY N/A 08/15/2016   Procedure: Left Heart Cath and Cors/Grafts Angiography;  Surgeon: Jettie Booze, MD;  Location: [facility];  Service: Cardiovascular;  Laterality: N/A;  . LEFT HEART CATHETERIZATION WITH CORONARY/GRAFT ANGIOGRAM N/A 12/25/2013   Procedure: LEFT HEART CATHETERIZATION WITH Beatrix Fetters;  Surgeon: Blane Ohara, MD;  Location: [facility] C[facility];  Service: Cardiovascular;  Laterality: N/A;  . RIGHT HEART CATH N/A 05/29/2017   Procedure: RIGHT HEART CATH;  Surgeon: Jolaine Artist, MD;  Location: [facility];  Service: Cardiovascular;  Laterality: N/A;  . rotator cuff left  2007  . TONSILLECTOMY       OB History   No obstetric history on file.     Family History  Problem Relation Age of Onset  . Heart attack Mother   . Diabetes Mother   . Heart attack Father 54       cause of death  . Diabetes Father   . Coronary artery disease Sister        CABG   . Diabetes Sister   . Glaucoma Sister   . Diabetes Brother   . Colon cancer Neg Hx     Social History   Tobacco Use  . Smoking status: Former Smoker    Packs/day: 0.50    Years: 20.00    Pack years: 10.00    Types: Cigarettes     Quit date: 07/21/1995    Years since quitting: 24.5  . Smokeless tobacco: Never Used  Vaping Use  . Vaping Use: Never used  Substance Use Topics  . Alcohol use: No  . Drug use: No    Home Medications Prior to Admission medications   Medication Sig Start Date End Date Taking? Authorizing Provider  albuterol (PROVENTIL HFA;VENTOLIN HFA) 108 (90 BASE) MCG/ACT inhaler Inhale 2 puffs into the lungs every 6 (six) hours as needed for wheezing or shortness of breath.    Yes [provider]  atorvastatin (LIPITOR) 20 MG tablet Take 20 mg by mouth daily. 01/01/20  Yes [provider]  ezetimibe (ZETIA) 10 MG tablet Take 10 mg by  mouth at bedtime.    Yes [provider]  FLUoxetine (PROZAC) 20 MG capsule Take 20 mg by mouth daily.    Yes [provider]  ipratropium (ATROVENT) 0.03 % nasal spray Place 1 spray into both nostrils daily. 12/23/19  Yes [provider]  isosorbide mononitrate (IMDUR) 60 MG 24 hr tablet Take 1.5 tablets (90 mg total) by mouth daily. 04/18/19 04/12/20 Yes Rolland Porter, MD  metoprolol succinate (TOPROL-XL) 25 MG 24 hr tablet Take 25 mg by mouth daily. 01/01/20  Yes [provider]  NITROSTAT 0.4 MG SL tablet Place 0.4 mg under the tongue every 5 (five) minutes as needed for chest pain (x 3 tabs daily).  11/15/13  Yes [provider]  Oxycodone HCl 10 MG TABS Take 10 mg by mouth 4 (four) times daily. Patient states that she takes as needed for Pain.   Yes [provider]  pantoprazole (PROTONIX) 40 MG tablet Take 40 mg by mouth daily.   Yes [provider]  spironolactone (ALDACTONE) 25 MG tablet Take 1 tablet (25 mg total) by mouth daily. 08/01/18 01/22/21 Yes Sherren Mocha, MD  torsemide (DEMADEX) 20 MG tablet Take 2 tablets (40 mg total) by mouth daily. 11/15/17 01/22/21 Yes Sherren Mocha, MD  valsartan (DIOVAN) 160 MG tablet Take 160 mg by mouth daily. 01/01/20  Yes [provider]   polyethylene glycol (MIRALAX / GLYCOLAX) packet Take 17 g by mouth daily.    [provider]    Allergies    Entresto [sacubitril-valsartan], Acetaminophen, Oxycodone, Ace inhibitors, Cefaclor, Cephalexin, Penicillins, Pregabalin, and Tape  Review of Systems   Review of Systems  Constitutional: Positive for fatigue. Negative for chills and fever.  HENT: Negative for congestion and sore throat.   Eyes: Negative.   Respiratory: Negative for chest tightness and shortness of breath.   Cardiovascular: Positive for chest pain and leg swelling.  Gastrointestinal: Positive for anal bleeding, constipation, diarrhea, nausea, rectal pain and vomiting. Negative for abdominal pain.  Genitourinary: Negative.   Musculoskeletal: Negative for arthralgias, joint swelling and neck pain.  Skin: Negative.  Negative for rash and wound.  Neurological: Positive for weakness. Negative for dizziness, light-headedness, numbness and headaches.  Psychiatric/Behavioral: Negative.     Physical Exam Updated Vital Signs BP 106/66   Pulse (!) 101   Temp 98.3 F (36.8 C) (Oral)   Resp 13   Ht 5' 2"  (1.575 m)   Wt 72.6 kg   LMP 03/29/2011   SpO2 95%   BMI 29.26 kg/m   Physical Exam Vitals and nursing note reviewed. Exam conducted with a chaperone present.  Constitutional:      Appearance: She is well-developed.  HENT:     Head: Normocephalic and atraumatic.     Mouth/Throat:     Mouth: Mucous membranes are moist.     Pharynx: Oropharynx is clear.  Eyes:     General: No scleral icterus.    Conjunctiva/sclera: Conjunctivae normal.     Comments: Mild scleral icterus  Cardiovascular:     Rate and Rhythm: Normal rate and regular rhythm.     Heart sounds: Normal heart sounds.  Pulmonary:     Effort: Pulmonary effort is normal. No respiratory distress.     Breath sounds: Normal breath sounds. No wheezing.  Abdominal:     General: Bowel sounds are normal.     Palpations: Abdomen is soft.      Tenderness: There is no abdominal tenderness. There is no guarding or rebound.  Comments: Generalized abdominal soreness, pt stating muscles sore from dry heaving.  No guarding, no increased abdominal tympany.  Genitourinary:    Rectum: Guaiac result positive. Tenderness present.     Comments: Rectal pain on exam, no external hemorrhoid. No internal hemorrhoid appreciated, no obvious rectal fissure.  Not grossly bloody, but strongly hemoccult positive.  Musculoskeletal:        General: Normal range of motion.     Cervical back: Normal range of motion.  Skin:    General: Skin is warm and dry.  Neurological:     Mental Status: She is alert.     ED Results / Procedures / Treatments   Labs (all labs ordered are listed, but only abnormal results are displayed) Labs Reviewed  COMPREHENSIVE METABOLIC PANEL - Abnormal; Notable for the following components:      Result Value   Sodium 130 (*)    Chloride 89 (*)    Glucose, Bld 103 (*)    Creatinine, Ser 1.15 (*)    Albumin 3.1 (*)    AST 86 (*)    Total Bilirubin 6.7 (*)    GFR, Estimated 51 (*)    All other components within normal limits  CBC - Abnormal; Notable for the following components:   RDW 17.4 (*)    Platelets 68 (*)    All other components within normal limits  AMMONIA - Abnormal; Notable for the following components:   Ammonia 43 (*)    All other components within normal limits  BRAIN NATRIURETIC PEPTIDE - Abnormal; Notable for the following components:   B Natriuretic Peptide 175.0 (*)    All other components within normal limits  TROPONIN I (HIGH SENSITIVITY) - Abnormal; Notable for the following components:   Troponin I (High Sensitivity) 40 (*)    All other components within normal limits  TROPONIN I (HIGH SENSITIVITY) - Abnormal; Notable for the following components:   Troponin I (High Sensitivity) 39 (*)    All other components within normal limits  RESP PANEL BY RT-PCR (FLU A&B, COVID) ARPGX2  LIPASE, BLOOD   POC OCCULT BLOOD, ED   Pt is strongly hemoccult positive.  EKG EKG Interpretation  Date/Time:  Thursday January 23 2020 18:41:42 EST Ventricular Rate:  93 PR Interval:    QRS Duration: 171 QT Interval:  454 QTC Calculation: 565 R Axis:   121 Text Interpretation: Sinus or ectopic atrial rhythm Atrial premature complex Consider left ventricular hypertrophy Lateral infarct, acute Prolonged QT interval Does not meet Sgarbossa criteria LBBB noted on May 19 2019 ecg Confirmed by Octaviano Glow [phone]) on 01/23/2020 6:46:00 PM   Radiology DG Chest Portable 1 View  Result Date: 01/23/2020 CLINICAL DATA:  Chest pain EXAM: PORTABLE CHEST 1 VIEW COMPARISON:  05/23/2019, 04/17/2018, 08/14/2016 CT 05/17/2019 FINDINGS: Post sternotomy changes. Cardiomegaly with aortic atherosclerosis. Chronic interstitial thickening. No consolidation or effusion. No pneumothorax. IMPRESSION: Cardiomegaly without overt pulmonary edema, pleural effusion or focal airspace disease. Electronically Signed   By: Donavan Foil M.D.   On: 01/23/2020 19:38    Procedures Procedures (including critical care time)  Medications Ordered in ED Medications  dextrose 5 %-0.9 % sodium chloride infusion ( Intravenous New Bag/Given 01/23/20 2212)  pantoprazole (PROTONIX) injection 40 mg (has no administration in time range)  ondansetron (ZOFRAN) injection 4 mg (4 mg Intravenous Given 01/23/20 1927)  sodium chloride 0.9 % bolus 500 mL (0 mLs Intravenous Stopped 01/23/20 2052)  HYDROmorphone (DILAUDID) injection 1 mg (1 mg Intravenous Given 01/23/20 2204)  ED Course  I have reviewed the triage vital signs and the nursing notes.  Pertinent labs & imaging results that were available during my care of the patient were reviewed by me and considered in my medical decision making (see chart for details).    MDM Rules/Calculators/A&P                          Pt with cirrhosis, esophageal varices with uncontrolled n/v,  unable to tolerate any po including  meds x 5 days, also with rectal bleeding and pain associated with bm's. Hemoccult positive, BUN normal range, no melena, less likely to be upper GI source such as variceal bleeding.    Elevated bilirubin beyond baseline, other liver enzymes stable.    CP last night and this am with elevated troponins but no delta changes.    Pt would benefit from admission due to intractable vomiting.  Discussed with Dr. Josephine Cables who accepts pt for admission. Final Clinical Impression(s) / ED Diagnoses Final diagnoses:  Intractable nausea and vomiting  Gastrointestinal hemorrhage, unspecified gastrointestinal hemorrhage type    Rx / DC Orders ED Discharge Orders    None       Landis Martins 01/23/20 2336    Wyvonnia Dusky, MD 01/24/20 1032

## 2020-01-23 NOTE — H&P (Signed)
History and Physical  SAMUELLA [name] MOQ:[id number] DOB: Aug 27, 1950 DOA: 01/23/2020  Referring physician: Evalee Jefferson, PA-C PCP: [name], MD  Patient coming from: Home  Chief Complaint: Nausea, vomiting and abdominal pain  HPI: Samantha [name] is a 69 y.o. female with medical history significant for CHF, [name] induced liver cirrhosis, CAD with CABG in 2001, esophageal varices, GERD, hypertension, paroxysmal atrial flutter and A. Fib (not on anticoagulant) who presents to the emergency department due to the onset of nausea, vomiting and abdominal pain.  Patient complained of onset of sharp chest pain 5 days ago (11/21), chest pain was midsternal and was of moderate intensity, she thought this was due to heart attack, so she took 4 baby aspirin and nitroglycerin sublingual x2 without any improvement.  Patient then took Tums (4 tablets) with slight improvement in the chest pain and she was able to sleep, on waking up in the morning, chest pain was minimal, but shortly after this she started to have abdominal pain associated with nausea and vomiting as well as diarrhea.  She has not been able to tolerate any oral intake due to vomiting which is now more of dry heaves.  Patient states that she had only blood in the commode when she had a bowel movement this morning and she also complained of sharp and burning sensation in the rectal area.  Patient states that she has communicated with gastroenterologist (Dr. Laural Golden) when symptoms 1st started earlier in the week, and she was told to go to the ED if symptoms persist.  She denies fever, shortness of breath, headache or any sick contact.  ED Course:  In the emergency department, she was hemodynamically stable.  Work-up in the ED showed thrombocytopenia, hyponatremia, troponin I 40>39.  Albumin 3.1, ammonia 43, BNP 175, AST 86, T bili 6.7.  Chest x-ray showed cardiomegaly without overt pulmonary edema, pleural effusion or focal airspace disease. IV Dilaudid  was given due to abdominal pain.  IV Protonix and Zofran were given, and gentle hydration with 500 mL of NS was given.  Hospitalist was asked to admit patient for further evaluation and management.  Review of Systems: Constitutional: Negative for chills and fever.  HENT: Negative for ear pain and sore throat.   Eyes: Negative for pain and visual disturbance.  Respiratory: Negative for cough, chest tightness and shortness of breath.   Cardiovascular: Positive for chest pain.  Negative for palpitations.  Gastrointestinal: Positive for abdominal pain, nausea and vomiting.  Endocrine: Negative for polyphagia and polyuria.  Genitourinary: Negative for decreased urine volume, dysuria, enuresis Musculoskeletal: Negative for arthralgias and back pain.  Skin: Negative for color change and rash.  Allergic/Immunologic: Negative for immunocompromised state.  Neurological: Negative for tremors, syncope, speech difficulty, weakness, light-headedness and headaches.  Hematological: Does not bruise/bleed easily.  All other systems reviewed and are negative   Past Medical History:  Diagnosis Date  . Cataract    OU  . Chronic combined systolic and diastolic CHF (congestive heart failure) ([place])   . Cirrhosis of liver ([place])   . CKD (chronic kidney disease), stage III ([place])   . Coronary artery disease    a. s/p CABG 2001 w/ (LIMA-OM, SVG-D1, SVG-RCA). b. h/o multiple PCIs per Dr. Antionette Char note.  . Depression   . Esophageal varices ([place])    New 2013  . Gastroesophageal reflux disease   . History of pneumonia   . Hyperlipidemia   . Hypertension   . Hypertensive retinopathy    OU  .  Obesity   . Osteoarthritis   . Pancytopenia ([place])   . Paroxysmal atrial flutter ([place])    a. dx 05/2016.  [name] Persistent atrial fibrillation ([place])    a. reported in hosp 07/2016, not on anticoag due to cirrhosis and liver disease, low platelets, varices.  . Thrombocytopenia ([place])    Past Surgical History:  Procedure  Laterality Date  . ABDOMINAL HYSTERECTOMY    . BACK SURGERY    . CARDIAC CATHETERIZATION  2004   left internal mammary artery to the obtuse marginal  was found to be small and thread like.  The two grafts were patent.  The left circumflex had 90% in-stent restenosis and cutting balloon angioplasty was performed followed by placement of a 3.0 x 48m Taxus drug -eluting stent.    .Marland KitchenCARDIAC CATHETERIZATION  2006   There was in-stent restenosis in the left circumflex and this was treated with cutting balloon angioplasty   . CARDIAC CATHETERIZATION  2008   vein graft to the to the obtuse marginal was patent, although small, left circumflex had 40% in-stent restenosis, ejection fraction 40-45%.  The patient was medically mananged.  .Marland KitchenCARDIAC CATHETERIZATION N/A 01/09/2015   Procedure: Left Heart Cath and Cors/Grafts Angiography;  Surgeon: HBelva Crome MD;  [facility];  Service: Cardiovascular;  Laterality: N/A;  . CATARACT EXTRACTION W/PHACO Right 05/17/2019   Procedure: CATARACT EXTRACTION PHACO AND INTRAOCULAR LENS PLACEMENT (IOC) (CDE: 14.99);  Surgeon: WBaruch Goldmann MD;  [facility]: [facility];  Service: Ophthalmology;  Laterality: Right;  . CATARACT EXTRACTION W/PHACO Left 06/10/2019   Procedure: CATARACT EXTRACTION PHACO AND INTRAOCULAR LENS PLACEMENT (IOC);  Surgeon: WBaruch Goldmann MD;  Location: [facility];  Service: Ophthalmology;  Laterality: Left;  CDE: 11.96  . cataract sx    . COLONOSCOPY  03/29/2011   Procedure: COLONOSCOPY;  Surgeon: MJamesetta So MD;  [facility];  Service: Gastroenterology;  Laterality: N/A;  . CORONARY ARTERY BYPASS GRAFT  May 31,2001   x 3 with a vein graft to the first diagonal, vein graft to the right coronary  artery, and a free left internal mammary  artery to the obtuse marginal   . ESOPHAGEAL BANDING N/A 07/04/2012   Procedure: ESOPHAGEAL BANDING;  Surgeon: NRogene Houston MD;  [facility];  Service: Endoscopy;  Laterality:  N/A;  . ESOPHAGEAL BANDING N/A 09/17/2012   Procedure: ESOPHAGEAL BANDING;  Surgeon: NRogene Houston MD;  [facility];  Service: Endoscopy;  Laterality: N/A;  . ESOPHAGEAL BANDING N/A 10/22/2013   Procedure: ESOPHAGEAL BANDING;  Surgeon: NRogene Houston MD;  [facility];  Service: Endoscopy;  Laterality: N/A;  . ESOPHAGEAL BANDING N/A 11/28/2014   Procedure: ESOPHAGEAL BANDING;  Surgeon: NRogene Houston MD;  [facility];  Service: Endoscopy;  Laterality: N/A;  . ESOPHAGEAL BANDING N/A 10/29/2015   Procedure: ESOPHAGEAL BANDING;  Surgeon: NRogene Houston MD;  [facility];  Service: Endoscopy;  Laterality: N/A;  . ESOPHAGOGASTRODUODENOSCOPY  12/20/2011   Procedure: ESOPHAGOGASTRODUODENOSCOPY (EGD);  Surgeon: MJamesetta So MD;  [facility];  Service: Gastroenterology;  Laterality: N/A;  . ESOPHAGOGASTRODUODENOSCOPY N/A 07/04/2012   Procedure: ESOPHAGOGASTRODUODENOSCOPY (EGD);  Surgeon: NRogene Houston MD;  [facility];  Service: Endoscopy;  Laterality: N/A;  235-moved to 255 Ann to notify pt  . ESOPHAGOGASTRODUODENOSCOPY N/A 09/17/2012   Procedure: ESOPHAGOGASTRODUODENOSCOPY (EGD);  Surgeon: NRogene Houston MD;  [facility];  Service: Endoscopy;  Laterality: N/A;  730  . ESOPHAGOGASTRODUODENOSCOPY N/A 10/22/2013   Procedure: ESOPHAGOGASTRODUODENOSCOPY (EGD);  Surgeon: Rogene Houston, MD;  [facility];  Service: Endoscopy;  Laterality: N/A;  730  . ESOPHAGOGASTRODUODENOSCOPY N/A 11/28/2014   Procedure: ESOPHAGOGASTRODUODENOSCOPY (EGD);  Surgeon: Rogene Houston, MD;  [facility];  Service: Endoscopy;  Laterality: N/A;  1:25  . ESOPHAGOGASTRODUODENOSCOPY N/A 10/29/2015   Procedure: ESOPHAGOGASTRODUODENOSCOPY (EGD);  Surgeon: Rogene Houston, MD;  [facility];  Service: Endoscopy;  Laterality: N/A;  12:00  . ESOPHAGOGASTRODUODENOSCOPY N/A 12/12/2016   Grade 1 and 2 varices in lower  third of esophagus, post variceal banding scar distal esophagus, mild portal hypertensive gastropathy, Type 1 gastroesophageal varices without bleeding, normal duodenum and second portion of duodenum.   [name] FLEXIBLE SIGMOIDOSCOPY N/A 03/20/2017   external hemorrhoids  . JOINT REPLACEMENT    . LEFT HEART CATH AND CORS/GRAFTS ANGIOGRAPHY N/A 08/15/2016   Procedure: Left Heart Cath and Cors/Grafts Angiography;  Surgeon: Jettie Booze, MD;  Location: [facility];  Service: Cardiovascular;  Laterality: N/A;  . LEFT HEART CATHETERIZATION WITH CORONARY/GRAFT ANGIOGRAM N/A 12/25/2013   Procedure: LEFT HEART CATHETERIZATION WITH Beatrix Fetters;  Surgeon: Blane Ohara, MD;  Location: [facility];  Service: Cardiovascular;  Laterality: N/A;  . RIGHT HEART CATH N/A 05/29/2017   Procedure: RIGHT HEART CATH;  Surgeon: Jolaine Artist, MD;  Location: [facility];  Service: Cardiovascular;  Laterality: N/A;  . rotator cuff left  2007  . TONSILLECTOMY      Social History:  reports that she quit smoking about 24 years ago. Her smoking use included cigarettes. She has a 10.00 pack-year smoking history. She has never used smokeless tobacco. She reports that she does not drink alcohol and does not use drugs.   Allergies  Allergen Reactions  . Entresto [Sacubitril-Valsartan] Swelling    Swelling of eyes  . Acetaminophen Other (See Comments)    Liver problems  . Oxycodone Itching and Nausea Only  . Ace Inhibitors Other (See Comments)    UNSURE OF REACTION TYPE   . Cefaclor Itching and Rash  . Cephalexin Itching and Rash  . Penicillins Rash    Has patient had a PCN reaction causing immediate rash, facial/tongue/throat swelling, SOB or lightheadedness with hypotension: YES Has patient had a PCN reaction causing severe rash involving mucus membranes or skin necrosis: NO Has patient had a PCN reaction that required hospitalization: YES Has patient had a PCN reaction occurring  within the last 10 years: NO If all of the above answers are "NO", then may proceed with Cephalosporin use.   . Pregabalin Other (See Comments)    Retains fluid  . Tape Itching and Rash    Takes skin right off with medical tape--PAPER TAPE ONLY    Family History  Problem Relation Age of Onset  . Heart attack Mother   . Diabetes Mother   . Heart attack Father 76       cause of death  . Diabetes Father   . Coronary artery disease Sister        CABG   . Diabetes Sister   . Glaucoma Sister   . Diabetes Brother   . Colon cancer Neg Hx      Prior to Admission medications   Medication Sig Start Date End Date Taking? Authorizing Provider  albuterol (PROVENTIL HFA;VENTOLIN HFA) 108 (90 BASE) MCG/ACT inhaler Inhale 2 puffs into the lungs every 6 (six) hours as  needed for wheezing or shortness of breath.    Yes [provider]  atorvastatin (LIPITOR) 20 MG tablet Take 20 mg by mouth daily. 01/01/20  Yes [provider]  ezetimibe (ZETIA) 10 MG tablet Take 10 mg by mouth at bedtime.    Yes [provider]  FLUoxetine (PROZAC) 20 MG capsule Take 20 mg by mouth daily.    Yes [provider]  ipratropium (ATROVENT) 0.03 % nasal spray Place 1 spray into both nostrils daily. 12/23/19  Yes [provider]  isosorbide mononitrate (IMDUR) 60 MG 24 hr tablet Take 1.5 tablets (90 mg total) by mouth daily. 04/18/19 04/12/20 Yes Rolland Porter, MD  metoprolol succinate (TOPROL-XL) 25 MG 24 hr tablet Take 25 mg by mouth daily. 01/01/20  Yes [provider]  NITROSTAT 0.4 MG SL tablet Place 0.4 mg under the tongue every 5 (five) minutes as needed for chest pain (x 3 tabs daily).  11/15/13  Yes [provider]  Oxycodone HCl 10 MG TABS Take 10 mg by mouth 4 (four) times daily. Patient states that she takes as needed for Pain.   Yes [provider]  pantoprazole (PROTONIX) 40 MG tablet Take 40 mg by mouth daily.   Yes [provider]   spironolactone (ALDACTONE) 25 MG tablet Take 1 tablet (25 mg total) by mouth daily. 08/01/18 01/22/21 Yes Sherren Mocha, MD  torsemide (DEMADEX) 20 MG tablet Take 2 tablets (40 mg total) by mouth daily. 11/15/17 01/22/21 Yes Sherren Mocha, MD  valsartan (DIOVAN) 160 MG tablet Take 160 mg by mouth daily. 01/01/20  Yes [provider]  polyethylene glycol (MIRALAX / GLYCOLAX) packet Take 17 g by mouth daily.    [provider]    Physical Exam: BP (!) 119/48   Pulse 86   Temp 98.3 F (36.8 C) (Oral)   Resp 10   Ht 5' 2"  (1.575 m)   Wt 72.6 kg   LMP 03/29/2011   SpO2 92%   BMI 29.26 kg/m   . General: 69 y.o. year-old female well developed well nourished in no acute distress.  Alert and oriented x3. [name] HEENT: Mild scleral icterus.  NCAT, EOMI . Neck: Supple, trachea medial . Cardiovascular: Irregular rate and rhythm with no rubs or gallops.  No thyromegaly or JVD noted.   2/4 pulses in all 4 extremities. [name] Respiratory: Clear to auscultation with no wheezes or rales. Good inspiratory effort. . Abdomen: Soft tender to palpation, nondistended with normal bowel sounds x4 quadrants. . Muskuloskeletal: LLE swelling. No cyanosis, clubbing or edema noted bilaterally . Neuro: CN II-XII intact, strength, sensation, reflexes . Skin: No ulcerative lesions noted or rashes . Psychiatry: Judgement and insight appear normal. Mood is appropriate for condition and setting          Labs on Admission:  Basic Metabolic Panel: Recent Labs  Lab 01/23/20 1821  NA 130*  K 3.5  CL 89*  CO2 28  GLUCOSE 103*  BUN 13  CREATININE 1.15*  CALCIUM 9.2   Liver Function Tests: Recent Labs  Lab 01/23/20 1821  AST 86*  ALT 24  ALKPHOS 94  BILITOT 6.7*  PROT 7.5  ALBUMIN 3.1*   Recent Labs  Lab 01/23/20 1821  LIPASE 50   Recent Labs  Lab 01/23/20 1821  AMMONIA 43*   CBC: Recent Labs  Lab 01/23/20 1821  WBC 7.4  HGB 14.5  HCT 42.9  MCV 94.5  PLT 68*   Cardiac  Enzymes: No results for  input(s): CKTOTAL, CKMB, CKMBINDEX, TROPONINI in the last 168 hours.  BNP (last 3 results) Recent Labs    05/23/19 0550 01/23/20 1821  BNP 134.0* 175.0*    ProBNP (last 3 results) No results for input(s): PROBNP in the last 8760 hours.  CBG: No results for input(s): GLUCAP in the last 168 hours.  Radiological Exams on Admission: DG Chest Portable 1 View  Result Date: 01/23/2020 CLINICAL DATA:  Chest pain EXAM: PORTABLE CHEST 1 VIEW COMPARISON:  05/23/2019, 04/17/2018, 08/14/2016 CT 05/17/2019 FINDINGS: Post sternotomy changes. Cardiomegaly with aortic atherosclerosis. Chronic interstitial thickening. No consolidation or effusion. No pneumothorax. IMPRESSION: Cardiomegaly without overt pulmonary edema, pleural effusion or focal airspace disease. Electronically Signed   By: Donavan Foil M.D.   On: 01/23/2020 19:38    EKG: I independently viewed the EKG done and my findings are as followed: Sinus/ectopic atrial rhythm at a rate of 93 bpm.  Prolonged QTc (553m)  Assessment/Plan Present on Admission: . Intractable nausea and vomiting . Abdominal pain . Cirrhosis, non-alcoholic ([place] . Persistent atrial fibrillation . CAD (coronary artery disease) . Essential hypertension . Mixed hyperlipidemia . GI bleed . Idiopathic esophageal varices without bleeding ([place] . Other cirrhosis of liver ([place] . Rectal pain . GERD (gastroesophageal reflux disease) . Atypical chest pain  Principal Problem:   Intractable nausea and vomiting Active Problems:   Mixed hyperlipidemia   CAD (coronary artery disease)   Essential hypertension   S/P CABG x 3- 2001   GI bleed   Cirrhosis, non-alcoholic ([facility])   Persistent atrial fibrillation   Other cirrhosis of liver ([facility])   Idiopathic esophageal varices without bleeding ([facility])   Rectal pain   Abdominal pain   Atypical chest pain   GERD (gastroesophageal reflux disease)   Thrombocytopenia ([facility])   Left leg swelling    Hyperammonemia ([facility])   Prolonged QT interval  Intractable nausea, vomiting and abdominal pain Continue IV hydration Continue IV morphine 2 mg q.4h p.r.n. for moderate to severe pain Continue IV Compazine 10 mg every 6 hours p.r.n. Continue clear liquid diet with plan to advance diet as tolerated  GI bleed Patient complained of bright red blood in the commode PTA H/H= 14.5/42.9, this was 11.4/36.5 on 05/24/19 Hemoccult was positive per ED medical record Continue Protonix  Gastroenterologist will be consulted in the morning.  She follows with Dr. RLaural Golden Atypical chest pain possibly due to GERD Continue Protonix  Elevated troponin possibly due to type II demand ischemia Troponin I 40 > 39; she denies chest pain at this time Continue to monitor  Prolonged QTc Avoid QT prolonging drugs Magnesium level will be checked  Thrombocytopenia Platelets 68, continue to monitor platelet level with morning labs Anticoagulant will be held at this time  Left leg swelling  Bilateral lower extremity ultrasound be done in the morning to rule out DVT  Hyperammonemia possibly due to patient's history of cirrhosis Ammonia level 43, patient asymptomatic  Cirrhosis due to NASH History of esophageal varices Continue beta-blocker Patient follows with Dr. RLaural Golden(GI)  History of CAD S/P CABG Continue beta-blocker Statin will be held due to liver enzyme elevation  History of paroxysmal atrial fibrillation EKG shows sinus/ectopic atrial rhythm at rate of 93 bpm Continue beta-blocker  Isolated elevated AST AST 86, statin will be held   DVT prophylaxis: SCDs  Code Status: Full code  Family Communication: None at bedside  Disposition Plan:  Patient is from:  home Anticipated DC to:                   SNF or family members home Anticipated DC date:               2-3 days Anticipated DC barriers:           Patient is unable to be discharged at this time due to  intractable nausea, vomiting abdominal pain as well as GI bleed that requires gastroenterology consult and recommendation.   Consults called: Gastroenterology  Admission status: Inpatient    Bernadette Hoit MD [facility]  01/24/2020, 3:57 AM

## 2020-01-24 ENCOUNTER — Inpatient Hospital Stay (HOSPITAL_COMMUNITY): Payer: Medicare Other

## 2020-01-24 DIAGNOSIS — R9431 Abnormal electrocardiogram [ECG] [EKG]: Secondary | ICD-10-CM

## 2020-01-24 DIAGNOSIS — I340 Nonrheumatic mitral (valve) insufficiency: Secondary | ICD-10-CM

## 2020-01-24 DIAGNOSIS — M7989 Other specified soft tissue disorders: Secondary | ICD-10-CM

## 2020-01-24 DIAGNOSIS — K7460 Unspecified cirrhosis of liver: Secondary | ICD-10-CM | POA: Diagnosis not present

## 2020-01-24 DIAGNOSIS — R0789 Other chest pain: Secondary | ICD-10-CM | POA: Diagnosis not present

## 2020-01-24 DIAGNOSIS — R112 Nausea with vomiting, unspecified: Secondary | ICD-10-CM | POA: Diagnosis not present

## 2020-01-24 DIAGNOSIS — E7220 Disorder of urea cycle metabolism, unspecified: Secondary | ICD-10-CM

## 2020-01-24 DIAGNOSIS — R079 Chest pain, unspecified: Secondary | ICD-10-CM

## 2020-01-24 DIAGNOSIS — R778 Other specified abnormalities of plasma proteins: Secondary | ICD-10-CM

## 2020-01-24 DIAGNOSIS — R197 Diarrhea, unspecified: Secondary | ICD-10-CM

## 2020-01-24 DIAGNOSIS — I361 Nonrheumatic tricuspid (valve) insufficiency: Secondary | ICD-10-CM

## 2020-01-24 DIAGNOSIS — D696 Thrombocytopenia, unspecified: Secondary | ICD-10-CM

## 2020-01-24 DIAGNOSIS — K7581 Nonalcoholic steatohepatitis (NASH): Secondary | ICD-10-CM

## 2020-01-24 DIAGNOSIS — I5042 Chronic combined systolic (congestive) and diastolic (congestive) heart failure: Secondary | ICD-10-CM | POA: Diagnosis not present

## 2020-01-24 LAB — APTT: aPTT: 41 seconds — ABNORMAL HIGH (ref 24–36)

## 2020-01-24 LAB — MAGNESIUM: Magnesium: 1.9 mg/dL (ref 1.7–2.4)

## 2020-01-24 LAB — CBC
HCT: 36.2 % (ref 36.0–46.0)
HCT: 39.8 % (ref 36.0–46.0)
Hemoglobin: 11.8 g/dL — ABNORMAL LOW (ref 12.0–15.0)
Hemoglobin: 13 g/dL (ref 12.0–15.0)
MCH: 31.5 pg (ref 26.0–34.0)
MCH: 32.3 pg (ref 26.0–34.0)
MCHC: 32.6 g/dL (ref 30.0–36.0)
MCHC: 32.7 g/dL (ref 30.0–36.0)
MCV: 96.5 fL (ref 80.0–100.0)
MCV: 98.8 fL (ref 80.0–100.0)
Platelets: 38 10*3/uL — ABNORMAL LOW (ref 150–400)
Platelets: 41 10*3/uL — ABNORMAL LOW (ref 150–400)
RBC: 3.75 MIL/uL — ABNORMAL LOW (ref 3.87–5.11)
RBC: 4.03 MIL/uL (ref 3.87–5.11)
RDW: 17.2 % — ABNORMAL HIGH (ref 11.5–15.5)
RDW: 17.5 % — ABNORMAL HIGH (ref 11.5–15.5)
WBC: 3.4 10*3/uL — ABNORMAL LOW (ref 4.0–10.5)
WBC: 4.1 10*3/uL (ref 4.0–10.5)
nRBC: 0 % (ref 0.0–0.2)
nRBC: 0 % (ref 0.0–0.2)

## 2020-01-24 LAB — PROTIME-INR
INR: 1.9 — ABNORMAL HIGH (ref 0.8–1.2)
Prothrombin Time: 21.4 seconds — ABNORMAL HIGH (ref 11.4–15.2)

## 2020-01-24 LAB — COMPREHENSIVE METABOLIC PANEL
ALT: 19 U/L (ref 0–44)
AST: 54 U/L — ABNORMAL HIGH (ref 15–41)
Albumin: 2.4 g/dL — ABNORMAL LOW (ref 3.5–5.0)
Alkaline Phosphatase: 64 U/L (ref 38–126)
Anion gap: 9 (ref 5–15)
BUN: 11 mg/dL (ref 8–23)
CO2: 32 mmol/L (ref 22–32)
Calcium: 8.1 mg/dL — ABNORMAL LOW (ref 8.9–10.3)
Chloride: 93 mmol/L — ABNORMAL LOW (ref 98–111)
Creatinine, Ser: 1.12 mg/dL — ABNORMAL HIGH (ref 0.44–1.00)
GFR, Estimated: 53 mL/min — ABNORMAL LOW (ref >60)
Glucose, Bld: 118 mg/dL — ABNORMAL HIGH (ref 70–99)
Potassium: 2.6 mmol/L — CL (ref 3.5–5.1)
Sodium: 134 mmol/L — ABNORMAL LOW (ref 135–145)
Total Bilirubin: 5.2 mg/dL — ABNORMAL HIGH (ref 0.3–1.2)
Total Protein: 5.7 g/dL — ABNORMAL LOW (ref 6.5–8.1)

## 2020-01-24 LAB — ECHOCARDIOGRAM COMPLETE
Area-P 1/2: 3.97 cm2
Height: 62 in
Weight: 2761.92 oz

## 2020-01-24 LAB — PHOSPHORUS: Phosphorus: 3.8 mg/dL (ref 2.5–4.6)

## 2020-01-24 MED ORDER — PROCHLORPERAZINE MALEATE 5 MG PO TABS: 10.0000 mg | ORAL_TABLET | Freq: Four times a day (QID) | ORAL | Status: DC | PRN

## 2020-01-24 MED ORDER — POTASSIUM CHLORIDE IN NACL 40-0.9 MEQ/L-% IV SOLN
INTRAVENOUS | Status: AC
  Filled 2020-01-24 (×2): qty 1000

## 2020-01-24 MED ORDER — METOPROLOL SUCCINATE ER 25 MG PO TB24
25.0000 mg | ORAL_TABLET | Freq: Every day | ORAL | Status: DC
  Filled 2020-01-24 (×2): qty 1

## 2020-01-24 MED ORDER — METOPROLOL SUCCINATE ER 25 MG PO TB24
12.5000 mg | ORAL_TABLET | Freq: Every day | ORAL | Status: DC
  Administered 2020-01-24 – 2020-01-28 (×5): 12.5 mg via ORAL
  Filled 2020-01-24 (×4): qty 1

## 2020-01-24 MED ORDER — POTASSIUM CHLORIDE CRYS ER 20 MEQ PO TBCR
20.0000 meq | EXTENDED_RELEASE_TABLET | Freq: Once | ORAL | Status: AC
  Administered 2020-01-24: 20 meq via ORAL
  Filled 2020-01-24: qty 2

## 2020-01-24 MED ORDER — POLYETHYLENE GLYCOL 3350 17 G PO PACK: 17.0000 g | PACK | Freq: Every day | ORAL | Status: DC

## 2020-01-24 MED ORDER — POTASSIUM CHLORIDE CRYS ER 20 MEQ PO TBCR
40.0000 meq | EXTENDED_RELEASE_TABLET | Freq: Once | ORAL | Status: AC
  Administered 2020-01-24: 40 meq via ORAL
  Filled 2020-01-24: qty 2

## 2020-01-24 MED ORDER — POTASSIUM CHLORIDE 10 MEQ/100ML IV SOLN
10.0000 meq | INTRAVENOUS | Status: AC
  Administered 2020-01-24 (×2): 10 meq via INTRAVENOUS
  Filled 2020-01-24 (×2): qty 100

## 2020-01-24 MED ORDER — IOHEXOL 300 MG/ML  SOLN
75.0000 mL | Freq: Once | INTRAMUSCULAR | Status: AC | PRN
  Administered 2020-01-24: 75 mL via INTRAVENOUS

## 2020-01-24 MED ORDER — PANTOPRAZOLE SODIUM 40 MG IV SOLR
40.0000 mg | Freq: Two times a day (BID) | INTRAVENOUS | Status: DC
  Administered 2020-01-24 – 2020-01-27 (×8): 40 mg via INTRAVENOUS
  Filled 2020-01-24 (×8): qty 40

## 2020-01-24 MED ORDER — ISOSORBIDE MONONITRATE ER 60 MG PO TB24
30.0000 mg | ORAL_TABLET | Freq: Every day | ORAL | Status: DC
  Administered 2020-01-24 – 2020-01-28 (×2): 30 mg via ORAL
  Filled 2020-01-24 (×10): qty 1

## 2020-01-24 MED ORDER — IPRATROPIUM BROMIDE 0.03 % NA SOLN
1.0000 | Freq: Every day | NASAL | Status: DC
  Filled 2020-01-24: qty 30

## 2020-01-24 MED ORDER — SPIRONOLACTONE 25 MG PO TABS
25.0000 mg | ORAL_TABLET | Freq: Every day | ORAL | Status: DC
  Filled 2020-01-24 (×4): qty 1

## 2020-01-24 MED ORDER — SODIUM CHLORIDE 0.9 % IV SOLN: Freq: Once | INTRAVENOUS | Status: DC

## 2020-01-24 MED ORDER — IOHEXOL 9 MG/ML PO SOLN
ORAL | Status: AC
  Filled 2020-01-24: qty 1000

## 2020-01-24 MED ORDER — ALBUTEROL SULFATE HFA 108 (90 BASE) MCG/ACT IN AERS: 2.0000 | INHALATION_SPRAY | Freq: Four times a day (QID) | RESPIRATORY_TRACT | Status: DC | PRN

## 2020-01-24 MED ORDER — PROCHLORPERAZINE EDISYLATE 10 MG/2ML IJ SOLN
10.0000 mg | Freq: Four times a day (QID) | INTRAMUSCULAR | Status: DC | PRN
  Administered 2020-01-24 – 2020-01-26 (×2): 10 mg via INTRAVENOUS
  Filled 2020-01-24 (×2): qty 2

## 2020-01-24 MED ORDER — HYDROMORPHONE HCL 1 MG/ML IJ SOLN
0.5000 mg | INTRAMUSCULAR | Status: DC | PRN
  Administered 2020-01-24 – 2020-01-27 (×5): 0.5 mg via INTRAVENOUS
  Filled 2020-01-24 (×3): qty 0.5
  Filled 2020-01-24 (×2): qty 1

## 2020-01-24 MED ORDER — PANTOPRAZOLE SODIUM 40 MG IV SOLR
40.0000 mg | INTRAVENOUS | Status: DC
  Administered 2020-01-24: 40 mg via INTRAVENOUS
  Filled 2020-01-24: qty 40

## 2020-01-24 MED ORDER — POTASSIUM CHLORIDE 10 MEQ/100ML IV SOLN
10.0000 meq | INTRAVENOUS | Status: DC
  Filled 2020-01-24: qty 100

## 2020-01-24 MED ORDER — ONDANSETRON HCL 4 MG/2ML IJ SOLN: 4.0000 mg | Freq: Four times a day (QID) | INTRAMUSCULAR | Status: DC

## 2020-01-24 MED ORDER — METOPROLOL SUCCINATE ER 25 MG PO TB24: 12.5000 mg | ORAL_TABLET | Freq: Every day | ORAL | Status: DC

## 2020-01-24 MED ORDER — PROCHLORPERAZINE EDISYLATE 10 MG/2ML IJ SOLN
5.0000 mg | Freq: Four times a day (QID) | INTRAMUSCULAR | Status: AC
  Administered 2020-01-24 – 2020-01-25 (×4): 5 mg via INTRAVENOUS
  Filled 2020-01-24 (×2): qty 2

## 2020-01-24 MED ORDER — NITROGLYCERIN 0.4 MG SL SUBL: 0.4000 mg | SUBLINGUAL_TABLET | SUBLINGUAL | Status: DC | PRN

## 2020-01-24 NOTE — ED Notes (Signed)
Pt ambulated to the bathroom with no assistance. Upon returning to her room pts O2 83%. Pt placed back on 2L of oxygen a this time.

## 2020-01-24 NOTE — Progress Notes (Signed)
*  PRELIMINARY RESULTS* Echocardiogram 2D Echocardiogram has been performed.  Samuel Germany 01/24/2020, 3:44 PM

## 2020-01-24 NOTE — Plan of Care (Signed)

## 2020-01-24 NOTE — ED Notes (Signed)
Date and time results received: 01/24/20 0508  Test: potassium Critical Value: 2.6  Name of Provider Notified: Josephine Cables, MD  Orders Received? Or Actions Taken?: acknowledged

## 2020-01-24 NOTE — Consult Note (Signed)
Maylon Peppers, M.D. Gastroenterology & Hepatology                                           Patient Name: Samantha [name] Account #: @FLAACCTNO @   MRN: [id number] Admission Date: 01/23/2020 Date of Evaluation:  01/24/2020 Time of Evaluation: 11:31 AM   Referring Physician: Orson Eva, MD  Chief Complaint: Nausea, vomiting and diarrhea  HPI:  This is a 69 y.o. 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  Patient reports that since Sunday she has felt constantly nauseated with multiple episodes of bilious vomiting. States the last time she vomited was 2 days ago.  However, she states that she was still retching yesterday which caused significant discomfort. Currently she feels hungry, was able to tolerate some Jell-O today but does not know if she may vomit later. She also reports having watery diarrhea close to 3 times per day, characterized as large amount of BM. She also reports that she saw fresh blood in her stool, usually when she had to strain to have BM as sometimes the bowel movements were very hard. Her last BM was yesterday.  She denies having any abdominal pain or distention, fever, chills, hematemesis, pruritus or jaundice.  Denies any sick contacts or recent travel.  Notably, patient had a CTA abdomen on 05/23/2019  That was negative for any vessel obstruction in the mesentery but there was some high grade stenosis in common iliac arteries.  Patient states that she takes Miralax every day to improve her bowel movements as she has a history of constipation.  In the ED, she was hypotensive with BP 82/36 and MAP 48, HR was 114 bpm and afebrile. Labs were remarkable for TB 6.7 AST 86 ALT 24 lipase 50 albumin 3.1 alk phos 94  , Na 130 Cl 89 decreased Cr 1.15 BUN 13, INR  1.9, CBC WNL. Patient was given IVF .  FHx: aunt NASH cirrhosis, neg for any other gastrointestinal/liver disease, no malignancies Social: neg smoking, alcohol or illicit drug use  Past Medical History: SEE CHRONIC ISSSUES: Past Medical History:  Diagnosis Date  . Cataract    OU  . Chronic combined systolic and diastolic CHF (congestive heart failure) ([place])   . Cirrhosis of liver ([place])   . CKD (chronic kidney disease), stage III ([place])   . Coronary artery disease    a. s/p CABG 2001 w/ (LIMA-OM, SVG-D1, SVG-RCA). b. h/o multiple PCIs per Dr. Antionette Char note.  . Depression   . Esophageal varices ([place])    New 2013  . Gastroesophageal reflux disease   . History of pneumonia   . Hyperlipidemia   . Hypertension   . Hypertensive retinopathy    OU  . Obesity   . Osteoarthritis   . Pancytopenia ([place])   . Paroxysmal atrial flutter ([place])    a. dx 05/2016.  [name] Persistent atrial fibrillation ([place])    a. reported in hosp 07/2016, not on anticoag due to cirrhosis and liver disease, low platelets, varices.  . Thrombocytopenia ([place])    Past Surgical History:  Past Surgical History:  Procedure Laterality Date  . ABDOMINAL HYSTERECTOMY    . BACK SURGERY    .  CARDIAC CATHETERIZATION  2004   left internal mammary artery to the obtuse marginal  was found to be small and thread like.  The two grafts were patent.  The left circumflex had 90% in-stent restenosis and cutting balloon angioplasty was performed followed by placement of a 3.0 x 47m Taxus drug -eluting stent.    .Marland KitchenCARDIAC CATHETERIZATION  2006   There was in-stent restenosis in the left circumflex and this was treated with cutting balloon angioplasty   . CARDIAC CATHETERIZATION  2008   vein graft to the to the obtuse marginal was patent, although small, left circumflex had 40% in-stent restenosis, ejection fraction 40-45%.  The patient was medically mananged.  .Marland KitchenCARDIAC CATHETERIZATION N/A  01/09/2015   Procedure: Left Heart Cath and Cors/Grafts Angiography;  Surgeon: HBelva Crome MD;  [facility];  Service: Cardiovascular;  Laterality: N/A;  . CATARACT EXTRACTION W/PHACO Right 05/17/2019   Procedure: CATARACT EXTRACTION PHACO AND INTRAOCULAR LENS PLACEMENT (IOC) (CDE: 14.99);  Surgeon: WBaruch Goldmann MD;  [facility]: [facility];  Service: Ophthalmology;  Laterality: Right;  . CATARACT EXTRACTION W/PHACO Left 06/10/2019   Procedure: CATARACT EXTRACTION PHACO AND INTRAOCULAR LENS PLACEMENT (IOC);  Surgeon: WBaruch Goldmann MD;  Location: [facility];  Service: Ophthalmology;  Laterality: Left;  CDE: 11.96  . cataract sx    . COLONOSCOPY  03/29/2011   Procedure: COLONOSCOPY;  Surgeon: MJamesetta So MD;  [facility];  Service: Gastroenterology;  Laterality: N/A;  . CORONARY ARTERY BYPASS GRAFT  May 31,2001   x 3 with a vein graft to the first diagonal, vein graft to the right coronary  artery, and a free left internal mammary  artery to the obtuse marginal   . ESOPHAGEAL BANDING N/A 07/04/2012   Procedure: ESOPHAGEAL BANDING;  Surgeon: NRogene Houston MD;  [facility];  Service: Endoscopy;  Laterality: N/A;  . ESOPHAGEAL BANDING N/A 09/17/2012   Procedure: ESOPHAGEAL BANDING;  Surgeon: NRogene Houston MD;  [facility];  Service: Endoscopy;  Laterality: N/A;  . ESOPHAGEAL BANDING N/A 10/22/2013   Procedure: ESOPHAGEAL BANDING;  Surgeon: NRogene Houston MD;  [facility];  Service: Endoscopy;  Laterality: N/A;  . ESOPHAGEAL BANDING N/A 11/28/2014   Procedure: ESOPHAGEAL BANDING;  Surgeon: NRogene Houston MD;  [facility];  Service: Endoscopy;  Laterality: N/A;  . ESOPHAGEAL BANDING N/A 10/29/2015   Procedure: ESOPHAGEAL BANDING;  Surgeon: NRogene Houston MD;  [facility];  Service: Endoscopy;  Laterality: N/A;  . ESOPHAGOGASTRODUODENOSCOPY  12/20/2011   Procedure: ESOPHAGOGASTRODUODENOSCOPY (EGD);  Surgeon:  MJamesetta So MD;  [facility];  Service: Gastroenterology;  Laterality: N/A;  . ESOPHAGOGASTRODUODENOSCOPY N/A 07/04/2012   Procedure: ESOPHAGOGASTRODUODENOSCOPY (EGD);  Surgeon: NRogene Houston MD;  [facility];  Service: Endoscopy;  Laterality: N/A;  235-moved to 255 Ann to notify pt  . ESOPHAGOGASTRODUODENOSCOPY N/A 09/17/2012   Procedure: ESOPHAGOGASTRODUODENOSCOPY (EGD);  Surgeon: NRogene Houston MD;  [facility];  Service: Endoscopy;  Laterality: N/A;  730  . ESOPHAGOGASTRODUODENOSCOPY N/A 10/22/2013   Procedure: ESOPHAGOGASTRODUODENOSCOPY (EGD);  Surgeon: NRogene Houston MD;  [facility];  Service: Endoscopy;  Laterality: N/A;  730  . ESOPHAGOGASTRODUODENOSCOPY N/A 11/28/2014   Procedure: ESOPHAGOGASTRODUODENOSCOPY (EGD);  Surgeon: NRogene Houston MD;  [facility];  Service: Endoscopy;  Laterality: N/A;  1:25  . ESOPHAGOGASTRODUODENOSCOPY N/A 10/29/2015   Procedure: ESOPHAGOGASTRODUODENOSCOPY (EGD);  Surgeon: NMechele Dawley  Laural Golden, MD;  [facility];  Service: Endoscopy;  Laterality: N/A;  12:00  . ESOPHAGOGASTRODUODENOSCOPY N/A 12/12/2016   Grade 1 and 2 varices in lower third of esophagus, post variceal banding scar distal esophagus, mild portal hypertensive gastropathy, Type 1 gastroesophageal varices without bleeding, normal duodenum and second portion of duodenum.   [name] FLEXIBLE SIGMOIDOSCOPY N/A 03/20/2017   external hemorrhoids  . JOINT REPLACEMENT    . LEFT HEART CATH AND CORS/GRAFTS ANGIOGRAPHY N/A 08/15/2016   Procedure: Left Heart Cath and Cors/Grafts Angiography;  Surgeon: Jettie Booze, MD;  Location: [facility];  Service: Cardiovascular;  Laterality: N/A;  . LEFT HEART CATHETERIZATION WITH CORONARY/GRAFT ANGIOGRAM N/A 12/25/2013   Procedure: LEFT HEART CATHETERIZATION WITH Beatrix Fetters;  Surgeon: Blane Ohara, MD;  Location: [facility];  Service: Cardiovascular;  Laterality: N/A;  .  RIGHT HEART CATH N/A 05/29/2017   Procedure: RIGHT HEART CATH;  Surgeon: Jolaine Artist, MD;  Location: [facility];  Service: Cardiovascular;  Laterality: N/A;  . rotator cuff left  2007  . TONSILLECTOMY     Family History:  Family History  Problem Relation Age of Onset  . Heart attack Mother   . Diabetes Mother   . Heart attack Father 9       cause of death  . Diabetes Father   . Coronary artery disease Sister        CABG   . Diabetes Sister   . Glaucoma Sister   . Diabetes Brother   . Colon cancer Neg Hx    Social History:  Social History   Tobacco Use  . Smoking status: Former Smoker    Packs/day: 0.50    Years: 20.00    Pack years: 10.00    Types: Cigarettes    Quit date: 07/21/1995    Years since quitting: 24.5  . Smokeless tobacco: Never Used  Vaping Use  . Vaping Use: Never used  Substance Use Topics  . Alcohol use: No  . Drug use: No    Home Medications:  Prior to Admission medications   Medication Sig Start Date End Date Taking? Authorizing Provider  albuterol (PROVENTIL HFA;VENTOLIN HFA) 108 (90 BASE) MCG/ACT inhaler Inhale 2 puffs into the lungs every 6 (six) hours as needed for wheezing or shortness of breath.    Yes [provider]  atorvastatin (LIPITOR) 20 MG tablet Take 20 mg by mouth daily. 01/01/20  Yes [provider]  ezetimibe (ZETIA) 10 MG tablet Take 10 mg by mouth at bedtime.    Yes [provider]  FLUoxetine (PROZAC) 20 MG capsule Take 20 mg by mouth daily.    Yes [provider]  ipratropium (ATROVENT) 0.03 % nasal spray Place 1 spray into both nostrils daily. 12/23/19  Yes [provider]  isosorbide mononitrate (IMDUR) 60 MG 24 hr tablet Take 1.5 tablets (90 mg total) by mouth daily. 04/18/19 04/12/20 Yes Rolland Porter, MD  metoprolol succinate (TOPROL-XL) 25 MG 24 hr tablet Take 25 mg by mouth daily. 01/01/20  Yes [provider]  NITROSTAT 0.4 MG SL tablet Place 0.4 mg under the  tongue every 5 (five) minutes as needed for chest pain (x 3 tabs daily).  11/15/13  Yes [provider]  Oxycodone HCl 10 MG TABS Take 10 mg by mouth 4 (four) times daily. Patient states that she takes as needed for Pain.   Yes [provider]  pantoprazole (PROTONIX) 40 MG tablet Take 40 mg  by mouth daily.   Yes [provider]  spironolactone (ALDACTONE) 25 MG tablet Take 1 tablet (25 mg total) by mouth daily. 08/01/18 01/22/21 Yes Sherren Mocha, MD  torsemide (DEMADEX) 20 MG tablet Take 2 tablets (40 mg total) by mouth daily. 11/15/17 01/22/21 Yes Sherren Mocha, MD  valsartan (DIOVAN) 160 MG tablet Take 160 mg by mouth daily. 01/01/20  Yes [provider]  polyethylene glycol (MIRALAX / GLYCOLAX) packet Take 17 g by mouth daily.    [provider]    Inpatient Medications:  Current Facility-Administered Medications:  .  0.9 % NaCl with KCl 40 mEq / L  infusion, , Intravenous, Continuous, Tat, David, MD .  albuterol (VENTOLIN HFA) 108 (90 Base) MCG/ACT inhaler 2 puff, 2 puff, Inhalation, Q6H PRN, Adefeso, Oladapo, DO .  HYDROmorphone (DILAUDID) injection 0.5 mg, 0.5 mg, Intravenous, Q4H PRN, Adefeso, Oladapo, DO, 0.5 mg at 01/24/20 0842 .  iohexol (OMNIPAQUE) 9 MG/ML oral solution, , , ,  .  ipratropium (ATROVENT) 0.03 % nasal spray 1 spray, 1 spray, Each Nare, Daily, Adefeso, Oladapo, DO .  isosorbide mononitrate (IMDUR) 24 hr tablet 30 mg, 30 mg, Oral, Daily, Tat, David, MD .  metoprolol succinate (TOPROL-XL) 24 hr tablet 25 mg, 25 mg, Oral, Daily, Adefeso, Oladapo, DO .  nitroGLYCERIN (NITROSTAT) SL tablet 0.4 mg, 0.4 mg, Sublingual, Q5 min PRN, Adefeso, Oladapo, DO .  pantoprazole (PROTONIX) injection 40 mg, 40 mg, Intravenous, Q12H, Tat, David, MD .  prochlorperazine (COMPAZINE) injection 10 mg, 10 mg, Intravenous, Q6H PRN, Tat, David, MD, 10 mg at 01/24/20 1057 .  prochlorperazine (COMPAZINE) injection 5 mg, 5 mg, Intravenous, Q6H, Tat, David,  MD, 5 mg at 01/24/20 1102 Allergies: Entresto [sacubitril-valsartan], Acetaminophen, Oxycodone, Ace inhibitors, Cefaclor, Cephalexin, Penicillins, Pregabalin, and Tape  Complete Review of Systems: GENERAL: negative for malaise, night sweats HEENT: No changes in hearing or vision, no nose bleeds or other nasal problems. NECK: Negative for lumps, goiter, pain and significant neck swelling RESPIRATORY: Negative for cough, wheezing CARDIOVASCULAR: Negative for chest pain, leg swelling, palpitations, orthopnea GI: SEE HPI MUSCULOSKELETAL: Negative for joint pain or swelling, back pain, and muscle pain. SKIN: Negative for lesions, rash PSYCH: Negative for sleep disturbance, mood disorder and recent psychosocial stressors. HEMATOLOGY Negative for prolonged bleeding, bruising easily, and swollen nodes. ENDOCRINE: Negative for cold or heat intolerance, polyuria, polydipsia and goiter. NEURO: negative for tremor, gait imbalance, syncope and seizures. The remainder of the review of systems is noncontributory.  Physical Exam: BP 98/66 (BP Location: Right Arm)   Pulse 87   Temp (!) 97.4 F (36.3 C) (Oral)   Resp 15   Ht 5' 2"  (1.575 m)   Wt 78.3 kg   LMP 03/29/2011   SpO2 93%   BMI 31.57 kg/m  GENERAL: The patient is AO x3, in no acute distress. HEENT: Head is normocephalic and atraumatic. EOMI are intact. Mouth is well hydrated and without lesions. NECK: Supple. No masses LUNGS: Clear to auscultation. No presence of rhonchi/wheezing/rales. Adequate chest expansion HEART: RRR, normal s1 and s2. ABDOMEN: generalized mildly tender throughout the abdomen, no guarding, no peritoneal signs, and nondistended. BS +. No masses. Very small ascitic wave. EXTREMITIES: Without any cyanosis, clubbing, rash, lesions or edema. No asterixis. NEUROLOGIC: AOx3, no focal motor deficit. SKIN: no jaundice, no rashes  Laboratory Data CBC:     Component Value Date/Time   WBC 3.4 (L) 01/24/2020 0401   RBC  3.75 (L) 01/24/2020 0401   HGB 11.8 (L) 01/24/2020 0401  HCT 36.2 01/24/2020 0401   PLT 38 (L) 01/24/2020 0401   MCV 96.5 01/24/2020 0401   MCH 31.5 01/24/2020 0401   MCHC 32.6 01/24/2020 0401   RDW 17.2 (H) 01/24/2020 0401   LYMPHSABS 0.8 05/21/2019 0640   MONOABS 0.3 05/21/2019 0640   EOSABS 0.2 05/21/2019 0640   BASOSABS 0.0 05/21/2019 0640   COAG:  Lab Results  Component Value Date   INR 1.9 (H) 01/24/2020   INR 1.4 (H) 05/15/2019   INR 1.3 (H) 02/08/2019    BMP:  BMP Latest Ref Rng & Units 01/24/2020 01/23/2020 05/24/2019  Glucose 70 - 99 mg/dL 118(H) 103(H) 91  BUN 8 - 23 mg/dL 11 13 13   Creatinine 0.44 - 1.00 mg/dL 1.12(H) 1.15(H) 0.99  BUN/Creat Ratio 12 - 28 - - -  Sodium 135 - 145 mmol/L 134(L) 130(L) 142  Potassium 3.5 - 5.1 mmol/L 2.6(LL) 3.5 3.5  Chloride 98 - 111 mmol/L 93(L) 89(L) 101  CO2 22 - 32 mmol/L 32 28 32  Calcium 8.9 - 10.3 mg/dL 8.1(L) 9.2 8.6(L)    HEPATIC:  Hepatic Function Latest Ref Rng & Units 01/24/2020 01/23/2020 05/23/2019  Total Protein 6.5 - 8.1 g/dL 5.7(L) 7.5 6.2(L)  Albumin 3.5 - 5.0 g/dL 2.4(L) 3.1(L) 2.7(L)  AST 15 - 41 U/L 54(H) 86(H) 33  ALT 0 - 44 U/L 19 24 12   Alk Phosphatase 38 - 126 U/L 64 94 61  Total Bilirubin 0.3 - 1.2 mg/dL 5.2(H) 6.7(H) 1.9(H)  Bilirubin, Direct 0.0 - 0.2 mg/dL - - -    CARDIAC:  Lab Results  Component Value Date   CKTOTAL 34 04/26/2008   CKMB 0.7 04/26/2008   TROPONINI <0.03 04/12/2018     Imaging: I personally reviewed and interpreted the available imaging.  Assessment & Plan: This is a 69 y.o. 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 The patient has presented acute onset of symptoms, etiology of her alterations is unclear at the  moment but this could be related to a gastrointestinal viral/bacterial infection recently. Would be important to check her stool for GI pathogens and C. Diff but I agree CT abdomen pelvis with IV contrast is warranted at this point. Notably, she had significant increase in her LFTs during this admission (MELD 23 today), which needs to be monitored as her dehydration may have led to worsening liver insult. She can tolerate clear liquids for now, will need to continue current supportive treatment with antiemetics.  # Nausea and vomiting # Acute diarrhea # NASH cirrhosis # Rectal bleeding - CLD, advance if tolerated - Check GI pathogen and C. Diff in stool - Daily MELD labs - Continue zofran4 mg q8h as needed for nausea - Miralax as needed for constipation (rectal bleeding likely related to straining)  Harvel Quale, MD Gastroenterology and Hepatology [facility]   Note: Occasional unusual wording and randomly placed punctuation marks may result from the use of speech recognition technology to transcribe this document

## 2020-01-24 NOTE — Progress Notes (Signed)
PROGRESS NOTE  Samantha [name] DGU:[id number] DOB: 1950/10/14 DOA: 01/23/2020 PCP: [name], MD  Brief History:  69 y/o female with a history of systolic and diastolic CHF,NASHliver cirrhosis, CKD stage III, hypertension, hyperlipidemia, pancytopenia, paroxysmal atrial fibrillation presenting with 4 day history of substernal and epigastric abdominal pain. The patient states that this has been occurring for about 4 days and it is like a tearing sharp sensation. She denies any frank dysphagia or odynophagia, but did have some episodes of nausea and vomiting.  The patient states that she began having nausea and vomiting without blood on 01/19/2020.  She subsequently developed some chest discomfort that was substernal and sharp in nature.  She stated that she took some Tums with some relief of her chest discomfort.  However, she continued to have nausea and vomiting to the point where she had dry heaves for the next 2 to 3 days.  She states that intermittently she would be able to keep down some liquids but continued to have upper abdominal discomfort and intermittent dry heaves.  As result, the patient presented for further evaluation.  She has some subjective fevers and chills.  She denies any headache, visual disturbance, hematemesis, dysuria, hematuria.  On 01/22/2020, the patient stated that she was feeling constipated and straining to have a bowel movement.  As result, she has been having intermittent hematochezia since that period of time.  She denies any NSAIDs.  Notably, the patient has similar presentation during the hospitalization from 05/19/2019 to 05/24/2019 during that hospitalization, the patient had a CTA chest and CT abdomen pelvis which were negative for PE and No evidence for significant stenosis involving the mesenteric vasculature.  There are no hospitalization, the patient also had a new oxygen requirement and discharged home with 2 L nasal cannula.  However, she stated she  has no longer wearing oxygen at home. In the emergency department, the patient was afebrile hemodynamically stable.  Oxygen saturation was 87% on room air.  She was placed on 2 L with oxygen saturation 93-95%.  Sodium 130, potassium 3.5, chloride 89, CO2 28, serum creatinine 1.5.  AST 86, ALT 24, alk phosphatase 94, total bilirubin 6.7.  BNP 175.  WBC 7.4, hemoglobin 14.5, platelets 68.  Troponin 40>> 39.  EKG showed sinus rhythm with PACs and left bundle branch block.  Assessment/Plan: Intractable nausea and vomiting and abdominal pain -Patient has similar presentation during her March 2021 admission where she had an extensive work-up -05/23/2019 CTA chest negative for PE -05/19/2019 CTA abdomen/pelvis--No evidence for significantstenosis involving the mesenteric vasculature. -Start compazine around-the-clock -GI consult -Start PPI -Clear liquid diet -Continue IV fluid -order CT abd/pelvis  Acute respiratory failure with hypoxia -Patient was discharged home on 2 L nasal cannula during her March 2021 admission -Patient says that she is no longer wearing oxygen -Oxygen saturation down to 87% on room air -Stable on 2 L -Secondary to COPD -suspect pt has been chronically hypoxic at home in setting of COPD with ~40 pack year hx tobacco -Personally reviewed chest x-ray--chronic bronchitic markings and scarring  NASH Liver Cirrhosis -holding torsemide and spironolactone temporarily due to volume depletion  Atypical chest pain -Troponins 40>>39 -suspect musculoskeletal pain/GERD -Personally reviewed EKG--sinus rhythm, nonspecific T wave changes -Continue Imdur -GERD  Hypokalemia -Add KCl to IV fluids -Repleted -Check magnesium  Hyponatremia -Secondary to liver cirrhosis and volume depletion -Holding torsemide and spironolactone temporarily  Hematochezia -suspect anorectal source -03/20/17 flex sig--normal sigmoid  and rectum; +ext hemorrhoids -GI consult  Persistent atrial  fibrillation -EKG shows sinus rhythm -not a candidate for AC due to cirrhosis, esophageal varices, severe thrombocytopenia -continue metoprolol succinate  Essential hypertension -Holding valsartan secondary to soft -continue metoprolol succinate  Hyperlipidemia -Continue Zetia and statin when able to tolerate po  Pancytopenia -Secondary to liver cirrhosis  Chronic systolic and diastolic CHF -Holding torsemide temporarily -05/20/19 Echo--EF50-55%, no Wma  Anxiety/depression -Continue fluoxetine when able to tolerate po       Status is: Inpatient  Remains inpatient appropriate because:IV treatments appropriate due to intensity of illness or inability to take PO   Dispo: The patient is from: Home              Anticipated d/c is to: Home              Anticipated d/c date is: 2 days              Patient currently is not medically stable to d/c.        Family Communication: no  Family at bedside  Consultants:  GI  Code Status:  FULL   DVT Prophylaxis:  [place] Heparin / [place] Lovenox   Procedures: As Listed in Progress Note Above  Antibiotics: None       Subjective: Patient continues to have intermittent dry heaves.  She has upper abdominal discomfort although it is better than yesterday.  She denies any hematemesis, dysuria, hematuria, chest pain, shortness breath, cough, hemoptysis.  Objective: Vitals:   01/24/20 0830 01/24/20 0845 01/24/20 0900 01/24/20 0930  BP:   (!) 100/49 (!) 111/53  Pulse: (!) 37 93 85 79  Resp: 16 (!) 28 11 15   Temp:      TempSrc:      SpO2: (!) 88% 94% 92% 92%  Weight:      Height:        Intake/Output Summary (Last 24 hours) at 01/24/2020 1007 Last data filed at 01/24/2020 0507 Gross per 24 hour  Intake 835.15 ml  Output --  Net 835.15 ml   Weight change:  Exam:   General:  Pt is alert, follows commands appropriately, not in acute distress  HEENT: No icterus, No thrush, No neck mass, [place]/AT  Cardiovascular:  RRR, S1/S2, no rubs, no gallops  Respiratory: Diminished breath sounds bilateral.  Bibasilar rales.  No wheezing  Abdomen: Soft/+BS, upper abdomen tender, non distended, no guarding  Extremities: No edema, No lymphangitis, No petechiae, No rashes, no synovitis   Data Reviewed: I have personally reviewed following labs and imaging studies Basic Metabolic Panel: Recent Labs  Lab 01/23/20 1821 01/24/20 0401  NA 130* 134*  K 3.5 2.6*  CL 89* 93*  CO2 28 32  GLUCOSE 103* 118*  BUN 13 11  CREATININE 1.15* 1.12*  CALCIUM 9.2 8.1*  MG  --  1.9  PHOS  --  3.8   Liver Function Tests: Recent Labs  Lab 01/23/20 1821 01/24/20 0401  AST 86* 54*  ALT 24 19  ALKPHOS 94 64  BILITOT 6.7* 5.2*  PROT 7.5 5.7*  ALBUMIN 3.1* 2.4*   Recent Labs  Lab 01/23/20 1821  LIPASE 50   Recent Labs  Lab 01/23/20 1821  AMMONIA 43*   Coagulation Profile: Recent Labs  Lab 01/24/20 0401  INR 1.9*   CBC: Recent Labs  Lab 01/23/20 1821 01/24/20 0401  WBC 7.4 3.4*  HGB 14.5 11.8*  HCT 42.9 36.2  MCV 94.5 96.5  PLT 68* 38*  Cardiac Enzymes: No results for input(s): CKTOTAL, CKMB, CKMBINDEX, TROPONINI in the last 168 hours. BNP: Invalid input(s): POCBNP CBG: No results for input(s): GLUCAP in the last 168 hours. HbA1C: No results for input(s): HGBA1C in the last 72 hours. Urine analysis:    Component Value Date/Time   COLORURINE YELLOW 05/19/2019 2015   APPEARANCEUR HAZY (A) 05/19/2019 2015   LABSPEC 1.013 05/19/2019 2015   PHURINE 5.0 05/19/2019 2015   GLUCOSEU NEGATIVE 05/19/2019 2015   HGBUR NEGATIVE 05/19/2019 2015   [place] NEGATIVE 05/19/2019 2015   KETONESUR NEGATIVE 05/19/2019 2015   PROTEINUR NEGATIVE 05/19/2019 2015   NITRITE NEGATIVE 05/19/2019 2015   LEUKOCYTESUR NEGATIVE 05/19/2019 2015   Sepsis Labs: @LABRCNTIP (procalcitonin:4,lacticidven:4) ) Recent Results (from the past 240 hour(s))  Resp Panel by RT-PCR (Flu A&B, Covid) Nasopharyngeal Swab      Status: None   Collection Time: 01/23/20  9:41 PM   Specimen: Nasopharyngeal Swab; Nasopharyngeal(NP) swabs in vial transport medium  Result Value Ref Range Status   SARS Coronavirus 2 by RT PCR NEGATIVE NEGATIVE Final    Comment: (NOTE) SARS-CoV-2 target nucleic acids are NOT DETECTED.  The SARS-CoV-2 RNA is generally detectable in upper respiratory specimens during the acute phase of infection. The lowest concentration of SARS-CoV-2 viral copies this assay can detect is 138 copies/mL. A negative result does not preclude SARS-Cov-2 infection and should not be used as the sole basis for treatment or other patient management decisions. A negative result may occur with  improper specimen collection/handling, submission of specimen other than nasopharyngeal swab, presence of viral mutation(s) within the areas targeted by this assay, and inadequate number of viral copies(<138 copies/mL). A negative result must be combined with clinical observations, patient history, and epidemiological information. The expected result is Negative.  Fact Sheet for Patients:  EntrepreneurPulse.com.au  Fact Sheet for Healthcare Providers:  IncredibleEmployment.be  This test is no t yet approved or cleared by the [place] FDA and  has been authorized for detection and/or diagnosis of SARS-CoV-2 by FDA under an Emergency Use Authorization (EUA). This EUA will remain  in effect (meaning this test can be used) for the duration of the COVID-19 declaration under Section 564(b)(1) of the Act, 21 U.S.C.section 360bbb-3(b)(1), unless the authorization is terminated  or revoked sooner.       Influenza A by PCR NEGATIVE NEGATIVE Final   Influenza B by PCR NEGATIVE NEGATIVE Final    Comment: (NOTE) The Xpert Xpress SARS-CoV-2/FLU/RSV plus assay is intended as an aid in the diagnosis of influenza from Nasopharyngeal swab specimens and should not be used as a sole basis  for treatment. Nasal washings and aspirates are unacceptable for Xpert Xpress SARS-CoV-2/FLU/RSV testing.  Fact Sheet for Patients: EntrepreneurPulse.com.au  Fact Sheet for Healthcare Providers: IncredibleEmployment.be  This test is not yet approved or cleared by the [place] FDA and has been authorized for detection and/or diagnosis of SARS-CoV-2 by FDA under an Emergency Use Authorization (EUA). This EUA will remain in effect (meaning this test can be used) for the duration of the COVID-19 declaration under Section 564(b)(1) of the Act, 21 U.S.C. section 360bbb-3(b)(1), unless the authorization is terminated or revoked.  Performed at [facility], [address], [place], [place] [address]      Scheduled Meds: . ipratropium  1 spray Each Nare Daily  . metoprolol succinate  25 mg Oral Daily  . pantoprazole (PROTONIX) IV  40 mg Intravenous Q24H  . spironolactone  25 mg Oral Daily   Continuous Infusions: .  0.9 % NaCl with KCl 40 mEq / L      Procedures/Studies: DG Chest Portable 1 View  Result Date: 01/23/2020 CLINICAL DATA:  Chest pain EXAM: PORTABLE CHEST 1 VIEW COMPARISON:  05/23/2019, 04/17/2018, 08/14/2016 CT 05/17/2019 FINDINGS: Post sternotomy changes. Cardiomegaly with aortic atherosclerosis. Chronic interstitial thickening. No consolidation or effusion. No pneumothorax. IMPRESSION: Cardiomegaly without overt pulmonary edema, pleural effusion or focal airspace disease. Electronically Signed   By: Donavan Foil M.D.   On: 01/23/2020 19:38    Orson Eva, DO  [facility]  If 7PM-7AM, please contact night-coverage [email or web address] Password [email or web address] 01/24/2020, 10:07 AM   LOS: 1 day

## 2020-01-25 DIAGNOSIS — I5042 Chronic combined systolic (congestive) and diastolic (congestive) heart failure: Secondary | ICD-10-CM | POA: Diagnosis not present

## 2020-01-25 DIAGNOSIS — K7460 Unspecified cirrhosis of liver: Secondary | ICD-10-CM | POA: Diagnosis not present

## 2020-01-25 DIAGNOSIS — R112 Nausea with vomiting, unspecified: Secondary | ICD-10-CM | POA: Diagnosis not present

## 2020-01-25 DIAGNOSIS — R0789 Other chest pain: Secondary | ICD-10-CM | POA: Diagnosis not present

## 2020-01-25 LAB — COMPREHENSIVE METABOLIC PANEL
ALT: 21 U/L (ref 0–44)
AST: 62 U/L — ABNORMAL HIGH (ref 15–41)
Albumin: 2.3 g/dL — ABNORMAL LOW (ref 3.5–5.0)
Alkaline Phosphatase: 57 U/L (ref 38–126)
Anion gap: 9 (ref 5–15)
BUN: 9 mg/dL (ref 8–23)
CO2: 27 mmol/L (ref 22–32)
Calcium: 8.2 mg/dL — ABNORMAL LOW (ref 8.9–10.3)
Chloride: 101 mmol/L (ref 98–111)
Creatinine, Ser: 1 mg/dL (ref 0.44–1.00)
GFR, Estimated: >60 mL/min (ref >60)
Glucose, Bld: 86 mg/dL (ref 70–99)
Potassium: 4.2 mmol/L (ref 3.5–5.1)
Sodium: 137 mmol/L (ref 135–145)
Total Bilirubin: 5.1 mg/dL — ABNORMAL HIGH (ref 0.3–1.2)
Total Protein: 5.6 g/dL — ABNORMAL LOW (ref 6.5–8.1)

## 2020-01-25 LAB — CBC
HCT: 36.9 % (ref 36.0–46.0)
Hemoglobin: 11.9 g/dL — ABNORMAL LOW (ref 12.0–15.0)
MCH: 32 pg (ref 26.0–34.0)
MCHC: 32.2 g/dL (ref 30.0–36.0)
MCV: 99.2 fL (ref 80.0–100.0)
Platelets: 38 10*3/uL — ABNORMAL LOW (ref 150–400)
RBC: 3.72 MIL/uL — ABNORMAL LOW (ref 3.87–5.11)
RDW: 17.6 % — ABNORMAL HIGH (ref 11.5–15.5)
WBC: 3.3 10*3/uL — ABNORMAL LOW (ref 4.0–10.5)
nRBC: 0 % (ref 0.0–0.2)

## 2020-01-25 LAB — GLUCOSE, CAPILLARY: Glucose-Capillary: 169 mg/dL — ABNORMAL HIGH (ref 70–99)

## 2020-01-25 LAB — PROTIME-INR
INR: 1.9 — ABNORMAL HIGH (ref 0.8–1.2)
Prothrombin Time: 21.1 seconds — ABNORMAL HIGH (ref 11.4–15.2)

## 2020-01-25 LAB — MAGNESIUM: Magnesium: 2 mg/dL (ref 1.7–2.4)

## 2020-01-25 NOTE — Progress Notes (Addendum)
 Castaneda, M.D. Gastroenterology & Hepatology   Interval History:  Patient reports feeling better today.  Denies having any nausea but she has been taking Compazine as needed for the symptoms.  Denies any vomiting, abdominal pain, abdominal distention, melena, hematochezia or episodes of diarrhea. GI pathogen is pending. Patient is asking if she can have more for solid food as she was able to tolerate liquids without any problem and she is feeling hungry. The patient underwent a CT of the abdomen and pelvis with IV contrast yesterday which showed presence of cirrhosis and portal hypertension, with presence of ascites but no acute alterations in her abdomen.  Inpatient Medications:  Current Facility-Administered Medications:  .  albuterol (VENTOLIN HFA) 108 (90 Base) MCG/ACT inhaler 2 puff, 2 puff, Inhalation, Q6H PRN, Adefeso, Oladapo, DO .  HYDROmorphone (DILAUDID) injection 0.5 mg, 0.5 mg, Intravenous, Q4H PRN, Adefeso, Oladapo, DO, 0.5 mg at 01/24/20 0842 .  ipratropium (ATROVENT) 0.03 % nasal spray 1 spray, 1 spray, Each Nare, Daily, Adefeso, Oladapo, DO .  isosorbide mononitrate (IMDUR) 24 hr tablet 30 mg, 30 mg, Oral, Daily, Tat, David, MD, 30 mg at 01/24/20 1235 .  metoprolol succinate (TOPROL-XL) 24 hr tablet 12.5 mg, 12.5 mg, Oral, Daily, Tat, David, MD, 12.5 mg at 01/24/20 1224 .  nitroGLYCERIN (NITROSTAT) SL tablet 0.4 mg, 0.4 mg, Sublingual, Q5 min PRN, Adefeso, Oladapo, DO .  pantoprazole (PROTONIX) injection 40 mg, 40 mg, Intravenous, Q12H, Tat, David, MD, 40 mg at 01/24/20 2242 .  prochlorperazine (COMPAZINE) injection 10 mg, 10 mg, Intravenous, Q6H PRN, Tat, David, MD, 10 mg at 01/24/20 1057   I/O    Intake/Output Summary (Last 24 hours) at 01/25/2020 0822 Last data filed at 01/24/2020 1219 Gross per 24 hour  Intake 280 ml  Output --  Net 280 ml     Physical Exam: Temp:  [97.4 F (36.3 C)-99.6 F (37.6 C)] 99.6 F (37.6 C) (11/27 0509) Pulse Rate:   [37-97] 75 (11/27 0509) Resp:  [11-28] 18 (11/27 0509) BP: (98-112)/(42-68) 99/42 (11/27 0509) SpO2:  [88 %-97 %] 93 % (11/27 0509) Weight:  [78.3 kg] 78.3 kg (11/26 1109)  Temp (24hrs), Avg:98.1 F (36.7 C), Min:97.4 F (36.3 C), Max:99.6 F (37.6 C)  GENERAL: The patient is AO x3, in no acute distress. HEENT: Head is normocephalic and atraumatic. EOMI are intact. Mouth is well hydrated and without lesions. NECK: Supple. No masses LUNGS: Clear to auscultation. No presence of rhonchi/wheezing/rales. Adequate chest expansion HEART: RRR, normal s1 and s2. ABDOMEN: Soft, nontender, no guarding, no peritoneal signs, and nondistended. BS +. No masses. EXTREMITIES: Without any cyanosis, clubbing, rash, lesions or edema. NEUROLOGIC: AOx3, no focal motor deficit. SKIN: no jaundice, no rashes  Laboratory Data: CBC:     Component Value Date/Time   WBC 4.1 01/24/2020 1058   RBC 4.03 01/24/2020 1058   HGB 13.0 01/24/2020 1058   HCT 39.8 01/24/2020 1058   PLT 41 (L) 01/24/2020 1058   MCV 98.8 01/24/2020 1058   MCH 32.3 01/24/2020 1058   MCHC 32.7 01/24/2020 1058   RDW 17.5 (H) 01/24/2020 1058   LYMPHSABS 0.8 05/21/2019 0640   MONOABS 0.3 05/21/2019 0640   EOSABS 0.2 05/21/2019 0640   BASOSABS 0.0 05/21/2019 0640   COAG:  Lab Results  Component Value Date   INR 1.9 (H) 01/24/2020   INR 1.4 (H) 05/15/2019   INR 1.3 (H) 02/08/2019    BMP:  BMP Latest Ref Rng & Units 01/24/2020 01/23/2020 05/24/2019  Glucose 70 -   99 mg/dL 118(H) 103(H) 91  BUN 8 - 23 mg/dL _0 Creatinine 0.44 - 1.00 mg/dL 1.12(H) 1.15(H) 0.99  BUN/Creat Ratio 12 - 28 - - -  Sodium 135 - 145 mmol/L 134(L) 130(L) 142  Potassium 3.5 - 5.1 mmol/L 2.6(LL) 3.5 3.5  Chloride 98 - 111 mmol/L 93(L) 89(L) 101  CO2 22 - 32 mmol/L 32 28 32  Calcium 8.9 - 10.3 mg/dL 8.1(L) 9.2 8.6(L)    HEPATIC:  Hepatic Function Latest Ref Rng & Units 01/24/2020 01/23/2020 05/23/2019  Total Protein 6.5 - 8.1 g/dL 5.7(L) 7.5 6.2(L)   Albumin 3.5 - 5.0 g/dL 2.4(L) 3.1(L) 2.7(L)  AST 15 - 41 U/L 54(H) 86(H) 33  ALT 0 - 44 U/L _1 Alk Phosphatase 38 - 126 U/L 64 94 61  Total Bilirubin 0.3 - 1.2 mg/dL 5.2(H) 6.7(H) 1.9(H)  Bilirubin, Direct 0.0 - 0.2 mg/dL - - -    CARDIAC:  Lab Results  Component Value Date   CKTOTAL 34 04/26/2008   CKMB 0.7 04/26/2008   TROPONINI <0.03 04/12/2018      Imaging: I personally reviewed and interpreted the available labs, imaging and endoscopic files.   Assessment/Plan: This is a 69 y.o. 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 The patient has presented acute onset of symptoms, etiology of her alterations is unclear at the moment but this could be related to a gastrointestinal viral/bacterial infection recently.  This is further supported by the fact the patient has had a major improvement in her symptoms since admission with supportive treatment, however we will wait to receive the report of the GI pathogen panel and c.diff in stool.  CT of the abdomen and pelvis with IV contrast showed presence of new onset ascites but no other acute alterations, will recommend obtaining sample to rule out SBP and check cytology, albumin and protein, will also obtain liver Doppler today. Notably, she had significant increase in her LFTs during this admission (MELD 23 today), which needs to be monitored as her dehydration may have led to worsening liver insult, however labs were not available today.  We will advance her diet to GI soft diet as she is feeling better.  # Nausea and vomiting # Acute diarrhea # NASH cirrhosis #Ascites # Rectal bleeding - GI soft diet, <2 g per day - Follow up GI pathogen and C. Diff in stool - Daily  MELD labs - please make sure these are sent - Continue compazine 5 mg mg q6h as needed for nausea - Miralax as needed for constipation (rectal bleeding likely related to straining) - Obtain liver Doppler - Diagnostic paracentesis, obtain gram stain, culture, cell count, albumin, protein and cytology  Harvel Quale, MD Gastroenterology and Hepatology [facility]   Note: Occasional unusual wording and randomly placed punctuation marks may result from the use of speech recognition technology to transcribe this document

## 2020-01-25 NOTE — Progress Notes (Addendum)
PROGRESS NOTE  Samantha [name] KKX:[id number] DOB: Sep 18, 1950 DOA: 01/23/2020 PCP: [name], MD   Brief History 69 y/o female with a history of systolic and diastolic CHF,NASHliver cirrhosis, CKD stage III, hypertension, hyperlipidemia, pancytopenia, paroxysmal atrial fibrillation presenting with 4 day history of substernal and epigastric abdominal pain. The patient states that this has been occurring for about 4 days and it is like a tearing sharp sensation. She denies any frank dysphagia or odynophagia, but did have some episodes of nausea and vomiting.  The patient states that she began having nausea and vomiting without blood on 01/19/2020.  She subsequently developed some chest discomfort that was substernal and sharp in nature.  She took 2 SL NTG without relief.  Then she took some Tums with some relief of her chest discomfort.  However, she continued to have nausea and vomiting to the point where she had dry heaves for the next 2 to 3 days.  She states that intermittently she would be able to keep down some liquids but continued to have upper abdominal discomfort and intermittent dry heaves.  As result, the patient presented for further evaluation.  She has some subjective fevers and chills.  She denies any headache, visual disturbance, hematemesis, dysuria, hematuria.  On 01/22/2020, the patient stated that she was feeling constipated and straining to have a bowel movement.  As result, she has been having intermittent hematochezia since that period of time.  She denies any NSAIDs.  Notably, the patient has similar presentation during the hospitalization from 05/19/2019 to 05/24/2019 during that hospitalization, the patient had a CTA chest and CT abdomen pelvis which were negative for PE and No evidence for significant stenosis involving the mesenteric vasculature.  There are no hospitalization, the patient also had a new oxygen requirement and discharged home with 2 L nasal cannula.   However, she stated she has no longer wearing oxygen at home. In the emergency department, the patient was afebrile hemodynamically stable.  Oxygen saturation was 87% on room air.  She was placed on 2 L with oxygen saturation 93-95%.  Sodium 130, potassium 3.5, chloride 89, CO2 28, serum creatinine 1.5.  AST 86, ALT 24, alk phosphatase 94, total bilirubin 6.7.  BNP 175.  WBC 7.4, hemoglobin 14.5, platelets 68.  Troponin 40>> 39.  EKG showed sinus rhythm with PACs and left bundle branch block.  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 -Start compazine around-the-clock -GI consulted-->plan diagnostic paracentesis 11/29 -Continue PPI -Clear liquid diet>>advanced per GI -Continue IV fluid -01/24/20 CT abd/pelvis--mild thickening ascending colon-nonspecific in cirrhosis with ascites; new upper abd ascites + splenomegaly  Acute respiratory failure with hypoxia -Patient was discharged home on 2 L nasal cannula during her March 2021 admission -Patient says that she is no longer wearing oxygen -Oxygen saturation down to 87% on room air -Stable on 2 L -Secondary to COPD -suspect pt has been chronically hypoxic at home in setting of COPD with ~40 pack year hx tobacco -Personally reviewed chest x-ray--chronic bronchitic markings and scarring  Chronic systolic and diastolic CHF -Holding torsemide temporarily -05/20/19 Echo--EF50-55%, no Wma -01/24/20 Echo--EF 35-40%, HK inf wall and inferolateral wall; mild-mod MR -continue metoprolol succinate -will need to revisit cardiology  NASH Liver Cirrhosis -  holding torsemide and spironolactone temporarily due to volume depletion and soft  BPs  Atypical chest pain -Troponins 40>>39 -suspect musculoskeletal pain/GERD -Personally reviewed EKG--sinus rhythm, nonspecific T wave changes -Continue Imdur -GERD  Hypokalemia -Add KCl to IV fluids -Repleted -Check magnesium--2.0  Hyponatremia -Secondary to liver cirrhosis and volume depletion -Holding torsemide and spironolactone temporarily  Hematochezia -suspect anorectal source -03/20/17 flex sig--normal sigmoid and rectum; +ext hemorrhoids -GI consult  Persistent atrial fibrillation -EKG shows sinus rhythm -not a candidate for AC due to cirrhosis, esophageal varices, severe thrombocytopenia -continue metoprolol succinate  Essential hypertension -Holding valsartan secondary to soft -continue metoprolol succinate  Hyperlipidemia -Continue Zetia and statin when able to tolerate po  Pancytopenia -Secondary to liver cirrhosis  Anxiety/depression -Continue fluoxetine when able to tolerate po  Lung Nodule -incidential finding of LLL--28m -outpatient surveillance     Status is: Inpatient  Remains inpatient appropriate because:IV treatments appropriate due to intensity of illness or inability to take PO   Dispo: The patient is from: Home  Anticipated d/c is to: Home  Anticipated d/c date is: 2 days  Patient currently is not medically stable to d/c.        Family Communication: no  Family at bedside  Consultants:  GI  Code Status:  FULL   DVT Prophylaxis:  [place] Heparin / [place] Lovenox   Procedures: As Listed in Progress Note Above  Antibiotics: None    Subjective: Patient states abd pain is improving.  No cp.  Denies f/c, cp, sob, n/v/d, dysuria  Objective: Vitals:   01/24/20 2150 01/25/20 0509 01/25/20 0959 01/25/20 1324  BP: (!) 98/51 (!) 99/42 (!) 106/51 (!) 99/43  Pulse: 97 75 82 81  Resp: 18 18 18 20   Temp: 98.1 F (36.7 C) 99.6 F (37.6 C)  97.8 F (36.6 C)   TempSrc:    Oral  SpO2: 92% 93% 92% 95%  Weight:      Height:       No intake or output data in the 24 hours ending 01/25/20 1339 Weight change: 5.724 kg Exam:   General:  Pt is alert, follows commands appropriately, not in acute distress  HEENT: No icterus, No thrush, No neck mass, [place]/AT  Cardiovascular: RRR, S1/S2, no rubs, no gallops  Respiratory:bibasilar crackles. No wheeze  Abdomen: Soft/+BS, non tender, non distended, no guarding  Extremities: Non pitting edema, No lymphangitis, No petechiae, No rashes, no synovitis   Data Reviewed: I have personally reviewed following labs and imaging studies Basic Metabolic Panel: Recent Labs  Lab 01/23/20 1821 01/24/20 0401 01/25/20 0809 01/25/20 0822  NA 130* 134*  --  137  K 3.5 2.6*  --  4.2  CL 89* 93*  --  101  CO2 28 32  --  27  GLUCOSE 103* 118*  --  86  BUN 13 11  --  9  CREATININE 1.15* 1.12*  --  1.00  CALCIUM 9.2 8.1*  --  8.2*  MG  --  1.9 2.0  --   PHOS  --  3.8  --   --    Liver Function Tests: Recent Labs  Lab 01/23/20 1821 01/24/20 0401 01/25/20 0822  AST 86* 54* 62*  ALT 24 19 21   ALKPHOS 94 64 57  BILITOT 6.7* 5.2* 5.1*  PROT 7.5 5.7* 5.6*  ALBUMIN 3.1* 2.4* 2.3*   Recent Labs  Lab 01/23/20 1821  LIPASE 50   Recent Labs  Lab 01/23/20 1821  AMMONIA 43*   Coagulation Profile: Recent Labs  Lab 01/24/20 0401 01/25/20 0809  INR 1.9* 1.9*   CBC: Recent Labs  Lab 01/23/20 1821 01/24/20 0401 01/24/20 1058  WBC 7.4 3.4* 4.1  HGB 14.5 11.8* 13.0  HCT 42.9 36.2 39.8  MCV 94.5 96.5 98.8  PLT 68* 38* 41*   Cardiac Enzymes: No results for input(s): CKTOTAL, CKMB, CKMBINDEX, TROPONINI in the last 168 hours. BNP: Invalid input(s): POCBNP CBG: No results for input(s): GLUCAP in the last 168 hours. HbA1C: No results for input(s): HGBA1C in the last 72 hours. Urine analysis:    Component Value Date/Time   COLORURINE YELLOW 05/19/2019 2015   APPEARANCEUR HAZY (A) 05/19/2019  2015   LABSPEC 1.013 05/19/2019 2015   PHURINE 5.0 05/19/2019 2015   GLUCOSEU NEGATIVE 05/19/2019 2015   HGBUR NEGATIVE 05/19/2019 2015   [place] NEGATIVE 05/19/2019 2015   KETONESUR NEGATIVE 05/19/2019 2015   PROTEINUR NEGATIVE 05/19/2019 2015   NITRITE NEGATIVE 05/19/2019 2015   LEUKOCYTESUR NEGATIVE 05/19/2019 2015   Sepsis Labs: @LABRCNTIP (procalcitonin:4,lacticidven:4) ) Recent Results (from the past 240 hour(s))  Resp Panel by RT-PCR (Flu A&B, Covid) Nasopharyngeal Swab     Status: None   Collection Time: 01/23/20  9:41 PM   Specimen: Nasopharyngeal Swab; Nasopharyngeal(NP) swabs in vial transport medium  Result Value Ref Range Status   SARS Coronavirus 2 by RT PCR NEGATIVE NEGATIVE Final    Comment: (NOTE) SARS-CoV-2 target nucleic acids are NOT DETECTED.  The SARS-CoV-2 RNA is generally detectable in upper respiratory specimens during the acute phase of infection. The lowest concentration of SARS-CoV-2 viral copies this assay can detect is 138 copies/mL. A negative result does not preclude SARS-Cov-2 infection and should not be used as the sole basis for treatment or other patient management decisions. A negative result may occur with  improper specimen collection/handling, submission of specimen other than nasopharyngeal swab, presence of viral mutation(s) within the areas targeted by this assay, and inadequate number of viral copies(<138 copies/mL). A negative result must be combined with clinical observations, patient history, and epidemiological information. The expected result is Negative.  Fact Sheet for Patients:  EntrepreneurPulse.com.au  Fact Sheet for Healthcare Providers:  IncredibleEmployment.be  This test is no t yet approved or cleared by the [place] FDA and  has been authorized for detection and/or diagnosis of SARS-CoV-2 by FDA under an Emergency Use Authorization (EUA). This EUA will remain  in effect  (meaning this test can be used) for the duration of the COVID-19 declaration under Section 564(b)(1) of the Act, 21 U.S.C.section 360bbb-3(b)(1), unless the authorization is terminated  or revoked sooner.       Influenza A by PCR NEGATIVE NEGATIVE Final   Influenza B by PCR NEGATIVE NEGATIVE Final    Comment: (NOTE) The Xpert Xpress SARS-CoV-2/FLU/RSV plus assay is intended as an aid in the diagnosis of influenza from Nasopharyngeal swab specimens and should not be used as a sole basis for treatment. Nasal washings and aspirates are unacceptable for Xpert Xpress SARS-CoV-2/FLU/RSV testing.  Fact Sheet for Patients: EntrepreneurPulse.com.au  Fact Sheet for Healthcare Providers: IncredibleEmployment.be  This test is not yet approved or cleared by the [place] FDA and has been authorized for detection and/or diagnosis of SARS-CoV-2 by FDA under an Emergency Use Authorization (EUA). This EUA will remain in effect (meaning this test can be used) for the duration of the COVID-19 declaration under Section 564(b)(1) of the Act, 21 U.S.C. section 360bbb-3(b)(1), unless the authorization is terminated or revoked.  Performed at [facility], [address] [place],  [place] [address]      Scheduled Meds: . ipratropium  1 spray Each Nare Daily  . isosorbide mononitrate  30 mg Oral Daily  . metoprolol succinate  12.5 mg Oral Daily  . pantoprazole (PROTONIX) IV  40 mg Intravenous Q12H   Continuous Infusions:  Procedures/Studies: CT ABDOMEN PELVIS W CONTRAST  Result Date: 01/24/2020 CLINICAL DATA:  Abdominal pain and fever. EXAM: CT ABDOMEN AND PELVIS WITH CONTRAST TECHNIQUE: Multidetector CT imaging of the abdomen and pelvis was performed using the standard protocol following bolus administration of intravenous contrast. CONTRAST:  30m OMNIPAQUE IOHEXOL 300 MG/ML  SOLN COMPARISON:  CT angiography of the abdomen pelvis 05/23/2019 FINDINGS: Lower  chest: Subsegmental atelectasis versus scar noted within the right middle lobe and both posterior lower lobes.6 mm ground-glass nodule is identified within the left lower lobe. This is new when compared with 05/23/2019. Hepatobiliary: Advanced changes of cirrhosis. The liver has a shrunken and nodular appearance. No focal liver abnormality identified. Gallbladder is unremarkable. Tiny stones within the dependent portion of the gallbladder suspected. No signs of biliary ductal dilatation or acute cholecystitis. Pancreas: Unremarkable. No pancreatic ductal dilatation or surrounding inflammatory changes. Spleen: Enlarged measuring 17.5 by 11.3 x 6.5 cm (volume = 670 cm^3) Adrenals/Urinary Tract: Normal appearance of the adrenal glands. No kidney mass or hydronephrosis identified. Urinary bladder is unremarkable. Stomach/Bowel: Stomach appears nondistended. The appendix is visualized and appears normal. No small bowel wall thickening, inflammation or distension. There is mild wall thickening involving the ascending colon, nonspecific in the setting of cirrhosis, ascites and portal venous hypertension. Sigmoid diverticulosis noted without acute inflammation. Vascular/Lymphatic: Aortic atherosclerosis. No aneurysm. Distal esophageal varices noted. There also large gastric and umbilical varices. No abdominal no pelvic adenopathy. Varices. Reproductive: Status post hysterectomy. No adnexal masses. Other: New upper abdominal ascites overlying the spleen and liver. No discrete fluid collections identified. Musculoskeletal: Previous right hip arthroplasty. Status post vertebral augmentation at L1. Interbody spacers are noted at L4-5 and L5-S1. IMPRESSION: 1. No acute findings identified within the abdomen or pelvis. 2. Advanced changes of cirrhosis and portal venous hypertension. New upper abdominal ascites. 3. New 6 mm ground-glass nodule identified within the left lower lobe. Likely postinflammatory Initial follow-up with CT  at 6-12 months is recommended to confirm persistence. If persistent, repeat CT is recommended every 2 years until 5 years of stability has been established. This recommendation follows the consensus statement: Guidelines for Management of Incidental Pulmonary Nodules Detected on CT Images: From the Fleischner Society 2017; Radiology 2017; 284:228-243. 4. Aortic atherosclerosis. Aortic Atherosclerosis (ICD10-I70.0). Electronically Signed   By: TKerby MoorsM.D.   On: 01/24/2020 14:14   UK[place]Venous Img Lower Bilateral (DVT)  Result Date: 01/24/2020 CLINICAL DATA:  Left leg swelling. EXAM: BILATERAL LOWER EXTREMITY VENOUS DOPPLER ULTRASOUND TECHNIQUE: Gray-scale sonography with graded compression, as well as color Doppler and duplex ultrasound were performed to evaluate the lower extremity deep venous systems from the level of the common femoral vein and including the common femoral, femoral, profunda femoral, popliteal and calf veins including the posterior tibial, peroneal and gastrocnemius veins when visible. The superficial great saphenous vein was also interrogated. Spectral Doppler was utilized to evaluate flow at rest and with distal augmentation maneuvers in the common femoral, femoral and popliteal veins. COMPARISON:  None. FINDINGS: RIGHT LOWER EXTREMITY Common Femoral Vein: No evidence of thrombus. Normal compressibility, respiratory phasicity and response to augmentation. Saphenofemoral Junction: No evidence of thrombus. Normal compressibility and flow on color Doppler imaging. Profunda Femoral Vein: No  evidence of thrombus. Normal compressibility and flow on color Doppler imaging. Femoral Vein: No evidence of thrombus. Normal compressibility, respiratory phasicity and response to augmentation. Popliteal Vein: No evidence of thrombus. Normal compressibility, respiratory phasicity and response to augmentation. Calf Veins: Visualized right deep calf veins are patent without thrombus. Other Findings:   None. LEFT LOWER EXTREMITY Common Femoral Vein: No evidence of thrombus. Normal compressibility, respiratory phasicity and response to augmentation. Saphenofemoral Junction: No evidence of thrombus. Normal compressibility and flow on color Doppler imaging. Profunda Femoral Vein: No evidence of thrombus. Normal compressibility and flow on color Doppler imaging. Femoral Vein: No evidence of thrombus. Normal compressibility, respiratory phasicity and response to augmentation. Popliteal Vein: No evidence of thrombus. Normal compressibility, respiratory phasicity and response to augmentation. Calf Veins: Visualized left deep calf veins are patent without thrombus. Other Findings:  None. IMPRESSION: No evidence of deep venous thrombosis in either lower extremity. Electronically Signed   By: Markus Daft M.D.   On: 01/24/2020 10:54   DG Chest Portable 1 View  Result Date: 01/23/2020 CLINICAL DATA:  Chest pain EXAM: PORTABLE CHEST 1 VIEW COMPARISON:  05/23/2019, 04/17/2018, 08/14/2016 CT 05/17/2019 FINDINGS: Post sternotomy changes. Cardiomegaly with aortic atherosclerosis. Chronic interstitial thickening. No consolidation or effusion. No pneumothorax. IMPRESSION: Cardiomegaly without overt pulmonary edema, pleural effusion or focal airspace disease. Electronically Signed   By: Donavan Foil M.D.   On: 01/23/2020 19:38   ECHOCARDIOGRAM COMPLETE  Result Date: 01/24/2020    ECHOCARDIOGRAM REPORT   Patient Name:   DONNIA POPLASKI Date of Exam: 01/24/2020 Medical Rec #:  [id number]      Height:       62.0 in Accession #:    [id number]     Weight:       172.6 lb Date of Birth:  05-06-1950      BSA:          1.796 m Patient Age:    45 years       BP:           107/59 mmHg Patient Gender: F              HR:           94 bpm. Exam Location:  [facility] Procedure: 2D Echo, Cardiac Doppler and Color Doppler Indications:    Chest Pain 786.50 / R07.9  History:        Patient has prior history of Echocardiogram examinations. CHF,                  CAD, Prior CABG, Arrythmias:Atrial Fibrillation; Risk                 Factors:Hypertension and Dyslipidemia. GERD, Obesity.  Sonographer:    Alvino Chapel RCS Referring Phys: [phone] Grayton Lobo IMPRESSIONS  1. Left ventricular ejection fraction, by estimation, is 35 to 40%. The left ventricle has moderately decreased function. The left ventricle demonstrates global hypokinesis. Left ventricular diastolic function could not be evaluated. There is severe hypokinesis of the left ventricular, entire inferior wall and inferolateral wall.  2. Right ventricular systolic function is mildly reduced. The right ventricular size is normal. There is mildly elevated pulmonary artery systolic pressure.  3. Left atrial size was severely dilated.  4. Right atrial size was severely dilated.  5. The mitral valve is normal in structure. Mild to moderate mitral valve regurgitation.  6. Tricuspid valve regurgitation is moderate.  7. The aortic valve is tricuspid. Aortic valve regurgitation is not visualized.  Mild aortic valve sclerosis is present, with no evidence of aortic valve stenosis.  8. The inferior vena cava is normal in size with <50% respiratory variability, suggesting right atrial pressure of 8 mmHg. Comparison(s): A prior study was performed on 05/20/2019. The left ventricular function is worsened. The left ventricular wall motion abnormalities are worse. FINDINGS  Left Ventricle: Left ventricular ejection fraction, by estimation, is 35 to 40%. The left ventricle has moderately decreased function. The left ventricle demonstrates global hypokinesis. Severe hypokinesis of the left ventricular, entire inferior wall and inferolateral wall. The left ventricular internal cavity size was normal in size. There is no left ventricular hypertrophy. Abnormal (paradoxical) septal motion consistent with post-operative status. Left ventricular diastolic function could not be evaluated due to atrial fibrillation. Left ventricular  diastolic function could not be evaluated. Right Ventricle: The right ventricular size is normal. No increase in right ventricular wall thickness. Right ventricular systolic function is mildly reduced. There is mildly elevated pulmonary artery systolic pressure. The tricuspid regurgitant velocity  is 2.75 m/s, and with an assumed right atrial pressure of 8 mmHg, the estimated right ventricular systolic pressure is 85.0 mmHg. Left Atrium: Left atrial size was severely dilated. Right Atrium: Right atrial size was severely dilated. Pericardium: There is no evidence of pericardial effusion. Mitral Valve: The mitral valve is normal in structure. Mild mitral annular calcification. Mild to moderate mitral valve regurgitation. Tricuspid Valve: The tricuspid valve is normal in structure. Tricuspid valve regurgitation is moderate. Aortic Valve: The aortic valve is tricuspid. Aortic valve regurgitation is not visualized. Mild aortic valve sclerosis is present, with no evidence of aortic valve stenosis. Pulmonic Valve: The pulmonic valve was normal in structure. Pulmonic valve regurgitation is not visualized. Aorta: The aortic root and ascending aorta are structurally normal, with no evidence of dilitation. Venous: The inferior vena cava is normal in size with less than 50% respiratory variability, suggesting right atrial pressure of 8 mmHg. IAS/Shunts: No atrial level shunt detected by color flow Doppler.  LEFT VENTRICLE PLAX 2D LVIDd:         5.00 cm  Diastology LV PW:         1.10 cm  LV e' medial:    9.57 cm/s LV IVS:        1.00 cm  LV E/e' medial:  16.1 LVOT diam:     1.80 cm  LV e' lateral:   14.30 cm/s LV SV:         56       LV E/e' lateral: 10.8 LV SV Index:   31 LVOT Area:     2.54 cm  RIGHT VENTRICLE RV S prime:     9.25 cm/s TAPSE (M-mode): 2.0 cm LEFT ATRIUM             Index       RIGHT ATRIUM           Index LA Vol (A2C):   86.2 ml 48.00 ml/m RA Area:     23.80 cm LA Vol (A4C):   81.9 ml 45.61 ml/m RA  Volume:   75.40 ml  41.99 ml/m LA Biplane Vol: 85.3 ml 47.50 ml/m  AORTIC VALVE LVOT Vmax:   99.20 cm/s LVOT Vmean:  64.300 cm/s LVOT VTI:    0.221 m  AORTA Ao Root diam: 4.90 cm MITRAL VALVE                TRICUSPID VALVE MV Area (PHT): 3.97 cm     TR Peak  grad:   30.2 mmHg MV Decel Time: 191 msec     TR Vmax:        275.00 cm/s MV E velocity: 154.00 cm/s                             SHUNTS                             Systemic VTI:  0.22 m                             Systemic Diam: 1.80 cm Dani Gobble Croitoru MD Electronically signed by Sanda Klein MD Signature Date/Time: 01/24/2020/4:45:09 PM    Final     Orson Eva, DO  [facility]  If 7PM-7AM, please contact night-coverage [email or web address] Password [email or web address] 01/25/2020, 1:39 PM   LOS: 2 days

## 2020-01-26 DIAGNOSIS — R112 Nausea with vomiting, unspecified: Secondary | ICD-10-CM | POA: Diagnosis not present

## 2020-01-26 DIAGNOSIS — R188 Other ascites: Secondary | ICD-10-CM

## 2020-01-26 DIAGNOSIS — I5042 Chronic combined systolic (congestive) and diastolic (congestive) heart failure: Secondary | ICD-10-CM | POA: Diagnosis not present

## 2020-01-26 DIAGNOSIS — K7460 Unspecified cirrhosis of liver: Secondary | ICD-10-CM | POA: Diagnosis not present

## 2020-01-26 DIAGNOSIS — E7220 Disorder of urea cycle metabolism, unspecified: Secondary | ICD-10-CM | POA: Diagnosis not present

## 2020-01-26 LAB — COMPREHENSIVE METABOLIC PANEL
ALT: 23 U/L (ref 0–44)
AST: 62 U/L — ABNORMAL HIGH (ref 15–41)
Albumin: 2.5 g/dL — ABNORMAL LOW (ref 3.5–5.0)
Alkaline Phosphatase: 67 U/L (ref 38–126)
Anion gap: 8 (ref 5–15)
BUN: 11 mg/dL (ref 8–23)
CO2: 28 mmol/L (ref 22–32)
Calcium: 8.5 mg/dL — ABNORMAL LOW (ref 8.9–10.3)
Chloride: 100 mmol/L (ref 98–111)
Creatinine, Ser: 1.05 mg/dL — ABNORMAL HIGH (ref 0.44–1.00)
GFR, Estimated: 57 mL/min — ABNORMAL LOW (ref >60)
Glucose, Bld: 149 mg/dL — ABNORMAL HIGH (ref 70–99)
Potassium: 3.2 mmol/L — ABNORMAL LOW (ref 3.5–5.1)
Sodium: 136 mmol/L (ref 135–145)
Total Bilirubin: 4.7 mg/dL — ABNORMAL HIGH (ref 0.3–1.2)
Total Protein: 6 g/dL — ABNORMAL LOW (ref 6.5–8.1)

## 2020-01-26 LAB — CBC
HCT: 39.3 % (ref 36.0–46.0)
Hemoglobin: 12.7 g/dL (ref 12.0–15.0)
MCH: 32.2 pg (ref 26.0–34.0)
MCHC: 32.3 g/dL (ref 30.0–36.0)
MCV: 99.5 fL (ref 80.0–100.0)
Platelets: 43 10*3/uL — ABNORMAL LOW (ref 150–400)
RBC: 3.95 MIL/uL (ref 3.87–5.11)
RDW: 18 % — ABNORMAL HIGH (ref 11.5–15.5)
WBC: 3.4 10*3/uL — ABNORMAL LOW (ref 4.0–10.5)
nRBC: 0 % (ref 0.0–0.2)

## 2020-01-26 LAB — PROTIME-INR
INR: 1.6 — ABNORMAL HIGH (ref 0.8–1.2)
Prothrombin Time: 18.6 seconds — ABNORMAL HIGH (ref 11.4–15.2)

## 2020-01-26 NOTE — Progress Notes (Addendum)
Samantha [name], M.D. Gastroenterology & Hepatology   Interval History: No acute events overnight. Patient states that she has been feeling better and has not presented any more episodes of nausea or vomiting, but has not required any antiemetics in the last 24 hours.  She reports that she has tolerated diet adequately yesterday, she had GI soft diet.  Denies any abdominal pain, fever, chills, abdominal distention.  She only had 1 soft bowel movement yesterday but has not presented any diarrhea, melena or hematochezia. Notably, the patient has been on nasal cannula 1 L/min as her saturation was slightly low but she denied having any shortness of breath or cough. Labs from today showed persistent mild leukopenia with no alterations of her hemoglobin, persistent thrombocytopenia.  INR was 1.6.  CMP pending.  Inpatient Medications:  Current Facility-Administered Medications:  .  albuterol (VENTOLIN HFA) 108 (90 Base) MCG/ACT inhaler 2 puff, 2 puff, Inhalation, Q6H PRN, Adefeso, Oladapo, DO .  HYDROmorphone (DILAUDID) injection 0.5 mg, 0.5 mg, Intravenous, Q4H PRN, Adefeso, Oladapo, DO, 0.5 mg at 01/25/20 2123 .  ipratropium (ATROVENT) 0.03 % nasal spray 1 spray, 1 spray, Each Nare, Daily, Adefeso, Oladapo, DO .  isosorbide mononitrate (IMDUR) 24 hr tablet 30 mg, 30 mg, Oral, Daily, Tat, David, MD, 30 mg at 01/24/20 1235 .  metoprolol succinate (TOPROL-XL) 24 hr tablet 12.5 mg, 12.5 mg, Oral, Daily, Tat, David, MD, 12.5 mg at 01/26/20 1018 .  nitroGLYCERIN (NITROSTAT) SL tablet 0.4 mg, 0.4 mg, Sublingual, Q5 min PRN, Adefeso, Oladapo, DO .  pantoprazole (PROTONIX) injection 40 mg, 40 mg, Intravenous, Q12H, Tat, David, MD, 40 mg at 01/26/20 1017 .  prochlorperazine (COMPAZINE) injection 10 mg, 10 mg, Intravenous, Q6H PRN, Tat, David, MD, 10 mg at 01/24/20 1057   I/O    Intake/Output Summary (Last 24 hours) at 01/26/2020 1150 Last data filed at 01/26/2020 0900 Gross per 24 hour  Intake 240 ml   Output --  Net 240 ml     Physical Exam: Temp:  [97.5 F (36.4 C)-98.4 F (36.9 C)] 98.2 F (36.8 C) (11/28 0439) Pulse Rate:  [76-88] 76 (11/28 1018) Resp:  [18-20] 18 (11/28 0439) BP: (79-108)/(40-53) 108/44 (11/28 1018) SpO2:  [94 %-95 %] 95 % (11/28 0439)  Temp (24hrs), Avg:98.1 F (36.7 C), Min:97.5 F (36.4 C), Max:98.4 F (36.9 C) GENERAL: The patient is AO x3, in no acute distress. HEENT: Head is normocephalic and atraumatic. EOMI are intact. Mouth is well hydrated and without lesions. On [place]. NECK: Supple. No masses LUNGS: Clear to auscultation. No presence of rhonchi/wheezing/rales. Adequate chest expansion HEART: RRR, normal s1 and s2. ABDOMEN: Soft, nontender, no guarding, no peritoneal signs, and nondistended. BS +. No masses. EXTREMITIES: Without any cyanosis, clubbing, rash, lesions or edema. NEUROLOGIC: AOx3, no focal motor deficit. SKIN: no jaundice, no rashes  Laboratory Data: CBC:     Component Value Date/Time   WBC 3.4 (L) 01/26/2020 1048   RBC 3.95 01/26/2020 1048   HGB 12.7 01/26/2020 1048   HCT 39.3 01/26/2020 1048   PLT 43 (L) 01/26/2020 1048   MCV 99.5 01/26/2020 1048   MCH 32.2 01/26/2020 1048   MCHC 32.3 01/26/2020 1048   RDW 18.0 (H) 01/26/2020 1048   LYMPHSABS 0.8 05/21/2019 0640   MONOABS 0.3 05/21/2019 0640   EOSABS 0.2 05/21/2019 0640   BASOSABS 0.0 05/21/2019 0640   COAG:  Lab Results  Component Value Date   INR 1.6 (H) 01/26/2020   INR 1.9 (H) 01/25/2020   INR 1.9 (  H) 01/24/2020    BMP:  BMP Latest Ref Rng & Units 01/25/2020 01/24/2020 01/23/2020  Glucose 70 - 99 mg/dL 86 118(H) 103(H)  BUN 8 - 23 mg/dL _0 Creatinine 0.44 - 1.00 mg/dL 1.00 1.12(H) 1.15(H)  BUN/Creat Ratio 12 - 28 - - -  Sodium 135 - 145 mmol/L 137 134(L) 130(L)  Potassium 3.5 - 5.1 mmol/L 4.2 2.6(LL) 3.5  Chloride 98 - 111 mmol/L 101 93(L) 89(L)  CO2 22 - 32 mmol/L 27 32 28  Calcium 8.9 - 10.3 mg/dL 8.2(L) 8.1(L) 9.2    HEPATIC:  Hepatic  Function Latest Ref Rng & Units 01/25/2020 01/24/2020 01/23/2020  Total Protein 6.5 - 8.1 g/dL 5.6(L) 5.7(L) 7.5  Albumin 3.5 - 5.0 g/dL 2.3(L) 2.4(L) 3.1(L)  AST 15 - 41 U/L 62(H) 54(H) 86(H)  ALT 0 - 44 U/L _1 Alk Phosphatase 38 - 126 U/L 57 64 94  Total Bilirubin 0.3 - 1.2 mg/dL 5.1(H) 5.2(H) 6.7(H)  Bilirubin, Direct 0.0 - 0.2 mg/dL - - -    CARDIAC:  Lab Results  Component Value Date   CKTOTAL 34 04/26/2008   CKMB 0.7 04/26/2008   TROPONINI <0.03 04/12/2018      Imaging: I personally reviewed and interpreted the available labs, imaging and endoscopic files.   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 The patient presented acute onset of symptoms, etiology of her symptoms is unclear but this could be related to a gastrointestinal viral/bacterial infection recently which seems to be resolving as she has required less medication and has tolerated diet adequately.    Given the fact that she has not presented any more episodes of diarrhea, I consider it is of low yield to obtain a stool sample first GI pathogen and C. difficile at this moment, enteric precautions will be discontinued. CT of the abdomen and pelvis with IV contrast showed presence of new onset ascites but no other acute alterations, hence will recommend obtaining sample to rule out SBP and check cytology, albumin and protein, will also obtain liver Doppler . Notably, she had significant increase in her LFTs during this admission (initial MELD 23), but overall her liver function tests were improving, will need to obtain repeat labs today.  We will continue her current diet of low sodium as she has tolerated this adequately.  #  Nausea and vomiting - improved # Acute diarrhea # NASH cirrhosis #Ascites # Rectal bleeding - GI soft diet, <2 g per day - Cancel GI pathogen and C. Diff in stool, D/C enteric contact precautions - Daily MELD labs - Continue compazine 5 mg mg q6h as needed for nausea - Miralax as needed for constipation (rectal bleeding likely related to straining) - Obtain liver Doppler - Diagnostic paracentesis tomorrow obtain gram stain, culture, cell count, albumin, protein and cytology  Samantha Peppers, MD Gastroenterology and Hepatology [facility]  Note: Occasional unusual wording and randomly placed punctuation marks may result from the use of speech recognition technology to transcribe this document

## 2020-01-26 NOTE — Progress Notes (Signed)
   01/25/20 2039  Assess: MEWS Score  Temp 98.4 F (36.9 C)  BP (!) 79/53  Pulse Rate 85  Resp 20  Level of Consciousness Alert  SpO2 95 %  O2 Device Nasal Cannula  O2 Flow Rate (L/min) 1 L/min  Assess: MEWS Score  MEWS Temp 0  MEWS Systolic 2  MEWS Pulse 0  MEWS RR 0  MEWS LOC 0  MEWS Score 2  MEWS Score Color Yellow  Assess: if the MEWS score is Yellow or Red  Were vital signs taken at a resting state? Yes  Focused Assessment No change from prior assessment  Early Detection of Sepsis Score *See Row Information* Low  MEWS guidelines implemented *See Row Information* Yes

## 2020-01-26 NOTE — Progress Notes (Signed)
PROGRESS NOTE  Samantha [name] BZJ:[id number] DOB: 11/14/1950 DOA: 01/23/2020 PCP: [name], MD   Brief History 69 y/o femalewith a history of systolic and diastolic CHF,NASHliver cirrhosis, CKD stage III, hypertension, hyperlipidemia, pancytopenia, paroxysmal atrial fibrillation presenting with4 dayhistory of substernal and epigastric abdominal pain. The patient states that this has been occurring for about4 daysand it is like a tearing sharp sensation. She denies any frank dysphagia or odynophagia, but did have some episodes of nausea and vomiting.The patient states that she began having nausea and vomiting without blood on 01/19/2020. She subsequently developed some chest discomfort that was substernal and sharp in nature.  She took 2 SL NTG without relief.  Then she took some Tums with some relief of her chest discomfort. However, she continued to have nausea and vomiting to the point where she had dry heaves for the next 2 to 3 days. She states that intermittently she would be able to keep down some liquids but continued to have upper abdominal discomfort and intermittent dry heaves. As result, the patient presented for further evaluation. She has some subjective fevers and chills. She denies any headache, visual disturbance, hematemesis, dysuria, hematuria. On 01/22/2020, the patient stated that she was feeling constipated and straining to have a bowel movement. As result, she has been having intermittent hematochezia since that period of time. She denies any NSAIDs.Notably, the patient has similar presentation during the hospitalization from 05/19/2019 to 05/24/2019 during that hospitalization, the patient had a CTA chest and CT abdomen pelvis which were negative for PE andNo evidence for significant stenosis involving the mesenteric vasculature.There are no hospitalization, the patient also had a new oxygen requirement and discharged home with 2 L nasal cannula.  However, she stated she has no longer wearing oxygen at home. In the emergency department, the patient was afebrile hemodynamically stable. Oxygen saturation was 87% on room air. She was placed on 2 L with oxygen saturation 93-95%. Sodium 130, potassium 3.5, chloride 89, CO2 28, serum creatinine 1.5. AST 86, ALT 24, alk phosphatase 94, total bilirubin 6.7. BNP 175. WBC 7.4, hemoglobin 14.5, platelets 68. Troponin 40>>39. EKG showed sinus rhythm with PACs and left bundle branch block.  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 -GI consulted-->plan diagnostic paracentesis 11/29 -Continue PPI -Clear liquid diet>>advanced per GI -Continue IV fluid -01/24/20 CT abd/pelvis--mild thickening ascending colon-nonspecific in cirrhosis with ascites; new upper abd ascites + splenomegaly -01/26/20--had epigastric pain with n/v x 1 after lunch  Acute respiratory failure with hypoxia -Patient was discharged home on 2 L nasal cannula during her March 2021 admission -Patient says that she is no longer wearing oxygen -Oxygen saturation down to 87% on room air -Stable on 2 L>>RA -Secondary to COPD -suspect pt has been chronically hypoxic at home in setting of COPD with ~40 pack year hx tobacco -Personally reviewed chest x-ray--chronic bronchitic markings and scarring  Chronic systolic and diastolic CHF -Holdingtorsemide temporarily -05/20/19 Echo--EF50-55%, no Wma -01/24/20 Echo--EF 35-40%, HK inf wall and inferolateral wall; mild-mod MR -restarted metoprolol succinate (had been off about 1 year) -cardiology consult -BP soft precludes added ARB/spiro  NASH Liver Cirrhosis with  ascites -holdingtorsemide and spironolactone temporarily due to volume depletion and soft BPs -paracentesis 11/29  Atypical  chest pain -Troponins40>>39 -suspect musculoskeletal pain/GERD -Personally reviewed EKG--sinus rhythm, nonspecific T wave changes -Continue Imdur if BP allows -GERD  Hypokalemia -AddKCl to IV fluids -Repleted -Check magnesium--2.0  Hyponatremia -Secondary to liver cirrhosis and volume depletion -Holding torsemide and spironolactone temporarily  Hematochezia -suspect anorectal source -03/20/17 flex sig--normal sigmoid and rectum; +ext hemorrhoids -GI consult  Persistent atrial fibrillation -EKG shows sinus rhythm -not a candidate for AC due to cirrhosis, esophageal varices, severe thrombocytopenia -continue metoprolol succinate  Essential hypertension -Holding valsartan secondary to soft -continue metoprolol succinate  Hyperlipidemia -Continue Zetiaand statin when able to tolerate po  Pancytopenia -Secondary to liver cirrhosis  Anxiety/depression -Continue fluoxetinewhen able to tolerate po  Lung Nodule -incidential finding of LLL--80m -outpatient surveillance     Status is: Inpatient  Remains inpatient appropriate because:IV treatments appropriate due to intensity of illness or inability to take PO   Dispo: The patient is from:Home Anticipated d/c is tEH:OZYYAnticipated d/c date is: 2 days Patient currently is not medically stable to d/c.        Family Communication:noFamily at bedside  Consultants:GI, cardiology  Code Status: FULL   DVT Prophylaxis: SCDs   Procedures: As Listed in Progress Note Above  Antibiotics: None    Subjective: Patient had episode of epigastric pain, then n/v after lunch today.  Had BM without blood.  Denies cp, sob,nv/d  Objective: Vitals:   01/26/20 0004 01/26/20 0439 01/26/20 1018 01/26/20 1549  BP:  (!) 102/50 (!) 99/48 (!) 108/44 (!) 97/43  Pulse: 88 78 76 82  Resp: 20 18  18   Temp: (!) 97.5 F (36.4 C) 98.2 F (36.8 C)  98 F (36.7 C)  TempSrc:    Oral  SpO2: 95% 95%  93%  Weight:      Height:        Intake/Output Summary (Last 24 hours) at 01/26/2020 1606 Last data filed at 01/26/2020 1300 Gross per 24 hour  Intake 480 ml  Output --  Net 480 ml   Weight change:  Exam:   General:  Pt is alert, follows commands appropriately, not in acute distress  HEENT: No icterus, No thrush, No neck mass, [place]/AT  Cardiovascular: RRR, S1/S2, no rubs, no gallops  Respiratory: bibasilar crackles. No wheeze  Abdomen: Soft/+BS, non tender, non distended, no guarding  Extremities: Non pitting edema, No lymphangitis, No petechiae, No rashes, no synovitis   Data Reviewed: I have personally reviewed following labs and imaging studies Basic Metabolic Panel: Recent Labs  Lab 01/23/20 1821 01/24/20 0401 01/25/20 0809 01/25/20 0822 01/26/20 1048  NA 130* 134*  --  137 136  K 3.5 2.6*  --  4.2 3.2*  CL 89* 93*  --  101 100  CO2 28 32  --  27 28  GLUCOSE 103* 118*  --  86 149*  BUN 13 11  --  9 11  CREATININE 1.15* 1.12*  --  1.00 1.05*  CALCIUM 9.2 8.1*  --  8.2* 8.5*  MG  --  1.9 2.0  --   --   PHOS  --  3.8  --   --   --    Liver Function Tests: Recent Labs  Lab 01/23/20 1821 01/24/20 0401 01/25/20 0822 01/26/20 1048  AST 86* 54* 62* 62*  ALT 24 19 21 23   ALKPHOS 94 64 57 67  BILITOT 6.7* 5.2* 5.1* 4.7*  PROT 7.5 5.7* 5.6* 6.0*  ALBUMIN 3.1* 2.4* 2.3* 2.5*   Recent Labs  Lab 01/23/20 1821  LIPASE  50   Recent Labs  Lab 01/23/20 1821  AMMONIA 43*   Coagulation Profile: Recent Labs  Lab 01/24/20 0401 01/25/20 0809 01/26/20 1048  INR 1.9* 1.9* 1.6*   CBC: Recent Labs  Lab 01/23/20 1821 01/24/20 0401 01/24/20 1058 01/25/20 1422 01/26/20 1048  WBC 7.4 3.4* 4.1 3.3* 3.4*  HGB 14.5 11.8* 13.0 11.9* 12.7  HCT 42.9 36.2 39.8 36.9 39.3  MCV 94.5  96.5 98.8 99.2 99.5  PLT 68* 38* 41* 38* 43*   Cardiac Enzymes: No results for input(s): CKTOTAL, CKMB, CKMBINDEX, TROPONINI in the last 168 hours. BNP: Invalid input(s): POCBNP CBG: Recent Labs  Lab 01/25/20 1630  GLUCAP 169*   HbA1C: No results for input(s): HGBA1C in the last 72 hours. Urine analysis:    Component Value Date/Time   COLORURINE YELLOW 05/19/2019 2015   APPEARANCEUR HAZY (A) 05/19/2019 2015   LABSPEC 1.013 05/19/2019 2015   PHURINE 5.0 05/19/2019 2015   GLUCOSEU NEGATIVE 05/19/2019 2015   HGBUR NEGATIVE 05/19/2019 2015   [place] NEGATIVE 05/19/2019 2015   KETONESUR NEGATIVE 05/19/2019 2015   PROTEINUR NEGATIVE 05/19/2019 2015   NITRITE NEGATIVE 05/19/2019 2015   LEUKOCYTESUR NEGATIVE 05/19/2019 2015   Sepsis Labs: @LABRCNTIP (procalcitonin:4,lacticidven:4) ) Recent Results (from the past 240 hour(s))  Resp Panel by RT-PCR (Flu A&B, Covid) Nasopharyngeal Swab     Status: None   Collection Time: 01/23/20  9:41 PM   Specimen: Nasopharyngeal Swab; Nasopharyngeal(NP) swabs in vial transport medium  Result Value Ref Range Status   SARS Coronavirus 2 by RT PCR NEGATIVE NEGATIVE Final    Comment: (NOTE) SARS-CoV-2 target nucleic acids are NOT DETECTED.  The SARS-CoV-2 RNA is generally detectable in upper respiratory specimens during the acute phase of infection. The lowest concentration of SARS-CoV-2 viral copies this assay can detect is 138 copies/mL. A negative result does not preclude SARS-Cov-2 infection and should not be used as the sole basis for treatment or other patient management decisions. A negative result may occur with  improper specimen collection/handling, submission of specimen other than nasopharyngeal swab, presence of viral mutation(s) within the areas targeted by this assay, and inadequate number of viral copies(<138 copies/mL). A negative result must be combined with clinical observations, patient history, and  epidemiological information. The expected result is Negative.  Fact Sheet for Patients:  EntrepreneurPulse.com.au  Fact Sheet for Healthcare Providers:  IncredibleEmployment.be  This test is no t yet approved or cleared by the [place] FDA and  has been authorized for detection and/or diagnosis of SARS-CoV-2 by FDA under an Emergency Use Authorization (EUA). This EUA will remain  in effect (meaning this test can be used) for the duration of the COVID-19 declaration under Section 564(b)(1) of the Act, 21 U.S.C.section 360bbb-3(b)(1), unless the authorization is terminated  or revoked sooner.       Influenza A by PCR NEGATIVE NEGATIVE Final   Influenza B by PCR NEGATIVE NEGATIVE Final    Comment: (NOTE) The Xpert Xpress SARS-CoV-2/FLU/RSV plus assay is intended as an aid in the diagnosis of influenza from Nasopharyngeal swab specimens and should not be used as a sole basis for treatment. Nasal washings and aspirates are unacceptable for Xpert Xpress SARS-CoV-2/FLU/RSV testing.  Fact Sheet for Patients: EntrepreneurPulse.com.au  Fact Sheet for Healthcare Providers: IncredibleEmployment.be  This test is not yet approved or cleared by the [place] FDA and has been authorized for detection and/or diagnosis of SARS-CoV-2 by FDA under an Emergency Use Authorization (EUA). This EUA will remain in effect (meaning this test  can be used) for the duration of the COVID-19 declaration under Section 564(b)(1) of the Act, 21 U.S.C. section 360bbb-3(b)(1), unless the authorization is terminated or revoked.  Performed at [facility], [address], [place], [place] [address]      Scheduled Meds: . ipratropium  1 spray Each Nare Daily  . isosorbide mononitrate  30 mg Oral Daily  . metoprolol succinate  12.5 mg Oral Daily  . pantoprazole (PROTONIX) IV  40 mg Intravenous Q12H   Continuous  Infusions:  Procedures/Studies: CT ABDOMEN PELVIS W CONTRAST  Result Date: 01/24/2020 CLINICAL DATA:  Abdominal pain and fever. EXAM: CT ABDOMEN AND PELVIS WITH CONTRAST TECHNIQUE: Multidetector CT imaging of the abdomen and pelvis was performed using the standard protocol following bolus administration of intravenous contrast. CONTRAST:  69m OMNIPAQUE IOHEXOL 300 MG/ML  SOLN COMPARISON:  CT angiography of the abdomen pelvis 05/23/2019 FINDINGS: Lower chest: Subsegmental atelectasis versus scar noted within the right middle lobe and both posterior lower lobes.6 mm ground-glass nodule is identified within the left lower lobe. This is new when compared with 05/23/2019. Hepatobiliary: Advanced changes of cirrhosis. The liver has a shrunken and nodular appearance. No focal liver abnormality identified. Gallbladder is unremarkable. Tiny stones within the dependent portion of the gallbladder suspected. No signs of biliary ductal dilatation or acute cholecystitis. Pancreas: Unremarkable. No pancreatic ductal dilatation or surrounding inflammatory changes. Spleen: Enlarged measuring 17.5 by 11.3 x 6.5 cm (volume = 670 cm^3) Adrenals/Urinary Tract: Normal appearance of the adrenal glands. No kidney mass or hydronephrosis identified. Urinary bladder is unremarkable. Stomach/Bowel: Stomach appears nondistended. The appendix is visualized and appears normal. No small bowel wall thickening, inflammation or distension. There is mild wall thickening involving the ascending colon, nonspecific in the setting of cirrhosis, ascites and portal venous hypertension. Sigmoid diverticulosis noted without acute inflammation. Vascular/Lymphatic: Aortic atherosclerosis. No aneurysm. Distal esophageal varices noted. There also large gastric and umbilical varices. No abdominal no pelvic adenopathy. Varices. Reproductive: Status post hysterectomy. No adnexal masses. Other: New upper abdominal ascites overlying the spleen and liver. No  discrete fluid collections identified. Musculoskeletal: Previous right hip arthroplasty. Status post vertebral augmentation at L1. Interbody spacers are noted at L4-5 and L5-S1. IMPRESSION: 1. No acute findings identified within the abdomen or pelvis. 2. Advanced changes of cirrhosis and portal venous hypertension. New upper abdominal ascites. 3. New 6 mm ground-glass nodule identified within the left lower lobe. Likely postinflammatory Initial follow-up with CT at 6-12 months is recommended to confirm persistence. If persistent, repeat CT is recommended every 2 years until 5 years of stability has been established. This recommendation follows the consensus statement: Guidelines for Management of Incidental Pulmonary Nodules Detected on CT Images: From the Fleischner Society 2017; Radiology 2017; 284:228-243. 4. Aortic atherosclerosis. Aortic Atherosclerosis (ICD10-I70.0). Electronically Signed   By: TKerby MoorsM.D.   On: 01/24/2020 14:14   UKoreaVenous Img Lower Bilateral (DVT)  Result Date: 01/24/2020 CLINICAL DATA:  Left leg swelling. EXAM: BILATERAL LOWER EXTREMITY VENOUS DOPPLER ULTRASOUND TECHNIQUE: Gray-scale sonography with graded compression, as well as color Doppler and duplex ultrasound were performed to evaluate the lower extremity deep venous systems from the level of the common femoral vein and including the common femoral, femoral, profunda femoral, popliteal and calf veins including the posterior tibial, peroneal and gastrocnemius veins when visible. The superficial great saphenous vein was also interrogated. Spectral Doppler was utilized to evaluate flow at rest and with distal augmentation maneuvers in the common femoral, femoral and popliteal veins. COMPARISON:  None.  FINDINGS: RIGHT LOWER EXTREMITY Common Femoral Vein: No evidence of thrombus. Normal compressibility, respiratory phasicity and response to augmentation. Saphenofemoral Junction: No evidence of thrombus. Normal compressibility  and flow on color Doppler imaging. Profunda Femoral Vein: No evidence of thrombus. Normal compressibility and flow on color Doppler imaging. Femoral Vein: No evidence of thrombus. Normal compressibility, respiratory phasicity and response to augmentation. Popliteal Vein: No evidence of thrombus. Normal compressibility, respiratory phasicity and response to augmentation. Calf Veins: Visualized right deep calf veins are patent without thrombus. Other Findings:  None. LEFT LOWER EXTREMITY Common Femoral Vein: No evidence of thrombus. Normal compressibility, respiratory phasicity and response to augmentation. Saphenofemoral Junction: No evidence of thrombus. Normal compressibility and flow on color Doppler imaging. Profunda Femoral Vein: No evidence of thrombus. Normal compressibility and flow on color Doppler imaging. Femoral Vein: No evidence of thrombus. Normal compressibility, respiratory phasicity and response to augmentation. Popliteal Vein: No evidence of thrombus. Normal compressibility, respiratory phasicity and response to augmentation. Calf Veins: Visualized left deep calf veins are patent without thrombus. Other Findings:  None. IMPRESSION: No evidence of deep venous thrombosis in either lower extremity. Electronically Signed   By: Markus Daft M.D.   On: 01/24/2020 10:54   DG Chest Portable 1 View  Result Date: 01/23/2020 CLINICAL DATA:  Chest pain EXAM: PORTABLE CHEST 1 VIEW COMPARISON:  05/23/2019, 04/17/2018, 08/14/2016 CT 05/17/2019 FINDINGS: Post sternotomy changes. Cardiomegaly with aortic atherosclerosis. Chronic interstitial thickening. No consolidation or effusion. No pneumothorax. IMPRESSION: Cardiomegaly without overt pulmonary edema, pleural effusion or focal airspace disease. Electronically Signed   By: Donavan Foil M.D.   On: 01/23/2020 19:38   ECHOCARDIOGRAM COMPLETE  Result Date: 01/24/2020    ECHOCARDIOGRAM REPORT   Patient Name:   Samantha [name] Date of Exam: 01/24/2020 Medical  Rec #:  [id number]      Height:       62.0 in Accession #:    [id number]     Weight:       172.6 lb Date of Birth:  [birth year]      BSA:          1.796 m Patient Age:    34 years       BP:           107/59 mmHg Patient Gender: F              HR:           94 bpm. Exam Location:  [facility] Procedure: 2D Echo, Cardiac Doppler and Color Doppler Indications:    Chest Pain 786.50 / R07.9  History:        Patient has prior history of Echocardiogram examinations. CHF,                 CAD, Prior CABG, Arrythmias:Atrial Fibrillation; Risk                 Factors:Hypertension and Dyslipidemia. GERD, Obesity.  Sonographer:    Alvino Chapel RCS Referring Phys: [phone] Nimrit Kehres IMPRESSIONS  1. Left ventricular ejection fraction, by estimation, is 35 to 40%. The left ventricle has moderately decreased function. The left ventricle demonstrates global hypokinesis. Left ventricular diastolic function could not be evaluated. There is severe hypokinesis of the left ventricular, entire inferior wall and inferolateral wall.  2. Right ventricular systolic function is mildly reduced. The right ventricular size is normal. There is mildly elevated pulmonary artery systolic pressure.  3. Left atrial size was severely dilated.  4. Right atrial size was  severely dilated.  5. The mitral valve is normal in structure. Mild to moderate mitral valve regurgitation.  6. Tricuspid valve regurgitation is moderate.  7. The aortic valve is tricuspid. Aortic valve regurgitation is not visualized. Mild aortic valve sclerosis is present, with no evidence of aortic valve stenosis.  8. The inferior vena cava is normal in size with <50% respiratory variability, suggesting right atrial pressure of 8 mmHg. Comparison(s): A prior study was performed on 05/20/2019. The left ventricular function is worsened. The left ventricular wall motion abnormalities are worse. FINDINGS  Left Ventricle: Left ventricular ejection fraction, by estimation, is 35 to 40%. The left  ventricle has moderately decreased function. The left ventricle demonstrates global hypokinesis. Severe hypokinesis of the left ventricular, entire inferior wall and inferolateral wall. The left ventricular internal cavity size was normal in size. There is no left ventricular hypertrophy. Abnormal (paradoxical) septal motion consistent with post-operative status. Left ventricular diastolic function could not be evaluated due to atrial fibrillation. Left ventricular diastolic function could not be evaluated. Right Ventricle: The right ventricular size is normal. No increase in right ventricular wall thickness. Right ventricular systolic function is mildly reduced. There is mildly elevated pulmonary artery systolic pressure. The tricuspid regurgitant velocity  is 2.75 m/s, and with an assumed right atrial pressure of 8 mmHg, the estimated right ventricular systolic pressure is 63.7 mmHg. Left Atrium: Left atrial size was severely dilated. Right Atrium: Right atrial size was severely dilated. Pericardium: There is no evidence of pericardial effusion. Mitral Valve: The mitral valve is normal in structure. Mild mitral annular calcification. Mild to moderate mitral valve regurgitation. Tricuspid Valve: The tricuspid valve is normal in structure. Tricuspid valve regurgitation is moderate. Aortic Valve: The aortic valve is tricuspid. Aortic valve regurgitation is not visualized. Mild aortic valve sclerosis is present, with no evidence of aortic valve stenosis. Pulmonic Valve: The pulmonic valve was normal in structure. Pulmonic valve regurgitation is not visualized. Aorta: The aortic root and ascending aorta are structurally normal, with no evidence of dilitation. Venous: The inferior vena cava is normal in size with less than 50% respiratory variability, suggesting right atrial pressure of 8 mmHg. IAS/Shunts: No atrial level shunt detected by color flow Doppler.  LEFT VENTRICLE PLAX 2D LVIDd:         5.00 cm  Diastology LV  PW:         1.10 cm  LV e' medial:    9.57 cm/s LV IVS:        1.00 cm  LV E/e' medial:  16.1 LVOT diam:     1.80 cm  LV e' lateral:   14.30 cm/s LV SV:         56       LV E/e' lateral: 10.8 LV SV Index:   31 LVOT Area:     2.54 cm  RIGHT VENTRICLE RV S prime:     9.25 cm/s TAPSE (M-mode): 2.0 cm LEFT ATRIUM             Index       RIGHT ATRIUM           Index LA Vol (A2C):   86.2 ml 48.00 ml/m RA Area:     23.80 cm LA Vol (A4C):   81.9 ml 45.61 ml/m RA Volume:   75.40 ml  41.99 ml/m LA Biplane Vol: 85.3 ml 47.50 ml/m  AORTIC VALVE LVOT Vmax:   99.20 cm/s LVOT Vmean:  64.300 cm/s LVOT VTI:    0.221 m  AORTA Ao Root diam: 4.90 cm MITRAL VALVE                TRICUSPID VALVE MV Area (PHT): 3.97 cm     TR Peak grad:   30.2 mmHg MV Decel Time: 191 msec     TR Vmax:        275.00 cm/s MV E velocity: 154.00 cm/s                             SHUNTS                             Systemic VTI:  0.22 m                             Systemic Diam: 1.80 cm Dani Gobble Croitoru MD Electronically signed by Sanda Klein MD Signature Date/Time: 01/24/2020/4:45:09 PM    Final     Orson Eva, DO  [facility]  If 7PM-7AM, please contact night-coverage [email or web address] Password [email or web address] 01/26/2020, 4:06 PM   LOS: 3 days

## 2020-01-27 ENCOUNTER — Inpatient Hospital Stay (HOSPITAL_COMMUNITY): Payer: Medicare Other

## 2020-01-27 DIAGNOSIS — K7460 Unspecified cirrhosis of liver: Secondary | ICD-10-CM | POA: Diagnosis not present

## 2020-01-27 DIAGNOSIS — I4819 Other persistent atrial fibrillation: Secondary | ICD-10-CM

## 2020-01-27 DIAGNOSIS — K922 Gastrointestinal hemorrhage, unspecified: Secondary | ICD-10-CM | POA: Diagnosis not present

## 2020-01-27 DIAGNOSIS — K7469 Other cirrhosis of liver: Secondary | ICD-10-CM | POA: Diagnosis not present

## 2020-01-27 DIAGNOSIS — R112 Nausea with vomiting, unspecified: Secondary | ICD-10-CM | POA: Diagnosis not present

## 2020-01-27 DIAGNOSIS — I255 Ischemic cardiomyopathy: Secondary | ICD-10-CM | POA: Diagnosis not present

## 2020-01-27 DIAGNOSIS — N183 Chronic kidney disease, stage 3 unspecified: Secondary | ICD-10-CM

## 2020-01-27 DIAGNOSIS — I2510 Atherosclerotic heart disease of native coronary artery without angina pectoris: Secondary | ICD-10-CM | POA: Diagnosis not present

## 2020-01-27 DIAGNOSIS — I5042 Chronic combined systolic (congestive) and diastolic (congestive) heart failure: Secondary | ICD-10-CM | POA: Diagnosis not present

## 2020-01-27 LAB — CBC
HCT: 36.5 % (ref 36.0–46.0)
Hemoglobin: 11.8 g/dL — ABNORMAL LOW (ref 12.0–15.0)
MCH: 31.9 pg (ref 26.0–34.0)
MCHC: 32.3 g/dL (ref 30.0–36.0)
MCV: 98.6 fL (ref 80.0–100.0)
Platelets: 38 10*3/uL — ABNORMAL LOW (ref 150–400)
RBC: 3.7 MIL/uL — ABNORMAL LOW (ref 3.87–5.11)
RDW: 17.9 % — ABNORMAL HIGH (ref 11.5–15.5)
WBC: 3.1 10*3/uL — ABNORMAL LOW (ref 4.0–10.5)
nRBC: 0 % (ref 0.0–0.2)

## 2020-01-27 LAB — COMPREHENSIVE METABOLIC PANEL
ALT: 19 U/L (ref 0–44)
AST: 49 U/L — ABNORMAL HIGH (ref 15–41)
Albumin: 2.2 g/dL — ABNORMAL LOW (ref 3.5–5.0)
Alkaline Phosphatase: 64 U/L (ref 38–126)
Anion gap: 9 (ref 5–15)
BUN: 11 mg/dL (ref 8–23)
CO2: 27 mmol/L (ref 22–32)
Calcium: 8.4 mg/dL — ABNORMAL LOW (ref 8.9–10.3)
Chloride: 100 mmol/L (ref 98–111)
Creatinine, Ser: 1.08 mg/dL — ABNORMAL HIGH (ref 0.44–1.00)
GFR, Estimated: 55 mL/min — ABNORMAL LOW (ref >60)
Glucose, Bld: 89 mg/dL (ref 70–99)
Potassium: 2.9 mmol/L — ABNORMAL LOW (ref 3.5–5.1)
Sodium: 136 mmol/L (ref 135–145)
Total Bilirubin: 4 mg/dL — ABNORMAL HIGH (ref 0.3–1.2)
Total Protein: 5.6 g/dL — ABNORMAL LOW (ref 6.5–8.1)

## 2020-01-27 LAB — PROTIME-INR
INR: 1.9 — ABNORMAL HIGH (ref 0.8–1.2)
Prothrombin Time: 21.3 seconds — ABNORMAL HIGH (ref 11.4–15.2)

## 2020-01-27 MED ORDER — POTASSIUM CHLORIDE CRYS ER 20 MEQ PO TBCR
20.0000 meq | EXTENDED_RELEASE_TABLET | Freq: Once | ORAL | Status: AC
  Administered 2020-01-27: 20 meq via ORAL
  Filled 2020-01-27: qty 1

## 2020-01-27 MED ORDER — SPIRONOLACTONE 25 MG PO TABS
12.5000 mg | ORAL_TABLET | Freq: Every day | ORAL | Status: DC
  Administered 2020-01-27 – 2020-01-28 (×2): 12.5 mg via ORAL
  Filled 2020-01-27: qty 1
  Filled 2020-01-27 (×3): qty 0.5

## 2020-01-27 MED ORDER — HYDROMORPHONE HCL 2 MG PO TABS
1.0000 mg | ORAL_TABLET | ORAL | Status: DC | PRN
  Administered 2020-01-27 – 2020-01-28 (×2): 1 mg via ORAL
  Filled 2020-01-27 (×2): qty 1

## 2020-01-27 NOTE — Progress Notes (Addendum)
PROGRESS NOTE  Samantha [name] TXM:[id number] DOB: 18-Apr-1950 DOA: 01/23/2020 PCP: [name], MD  Brief History 69 y/o femalewith a history of systolic and diastolic CHF,NASHliver cirrhosis, CKD stage III, hypertension, hyperlipidemia, pancytopenia, paroxysmal atrial fibrillation presenting with4 dayhistory of substernal and epigastric abdominal pain. The patient states that this has been occurring for about4 daysand it is like a tearing sharp sensation. She denies any frank dysphagia or odynophagia, but did have some episodes of nausea and vomiting.The patient states that she began having nausea and vomiting without blood on 01/19/2020. She subsequently developed some chest discomfort that was substernal and sharp in nature.She took 2 SL NTG without relief. Thenshe took some Tums with some relief of her chest discomfort. However, she continued to have nausea and vomiting to the point where she had dry heaves for the next 2 to 3 days. She states that intermittently she would be able to keep down some liquids but continued to have upper abdominal discomfort and intermittent dry heaves. As result, the patient presented for further evaluation. She has some subjective fevers and chills. She denies any headache, visual disturbance, hematemesis, dysuria, hematuria. On 01/22/2020, the patient stated that she was feeling constipated and straining to have a bowel movement. As result, she has been having intermittent hematochezia since that period of time. She denies any NSAIDs.Notably, the patient has similar presentation during the hospitalization from 05/19/2019 to 05/24/2019 during that hospitalization, the patient had a CTA chest and CT abdomen pelvis which were negative for PE andNo evidence for significant stenosis involving the mesenteric vasculature.There are no hospitalization, the patient also had a new oxygen requirement and discharged home with 2 L nasal cannula.  However, she stated she has no longer wearing oxygen at home. In the emergency department, the patient was afebrile hemodynamically stable. Oxygen saturation was 87% on room air. She was placed on 2 L with oxygen saturation 93-95%. Sodium 130, potassium 3.5, chloride 89, CO2 28, serum creatinine 1.5. AST 86, ALT 24, alk phosphatase 94, total bilirubin 6.7. BNP 175. WBC 7.4, hemoglobin 14.5, platelets 68. Troponin 40>>39. EKG showed sinus rhythm with PACs and left bundle branch block.  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 -GI consulted-->plan diagnostic paracentesis 11/29 -ContinuePPI -Clear liquid diet>>advanced per GI -11/26/21CT abd/pelvis--mild thickening ascending colon-nonspecific in cirrhosis with ascites; new upper abd ascites + splenomegaly -01/27/20--tolerating diet  Acute respiratory failure with hypoxia -Patient was discharged home on 2 L nasal cannula during her March 2021 admission -Patient says that she is no longer wearing oxygen -Oxygen saturation down to 87% on room air -Stable on 2 L>>RA -Secondary to COPD -suspect pt has been chronically hypoxic at home in setting of COPD with ~40 pack year hx tobacco -Personally reviewed chest x-ray--chronic bronchitic markings and scarring  Chronic systolic and diastolic CHF -Holdingtorsemide temporarily -05/20/19 Echo--EF50-55%, no Wma -01/24/20 Echo--EF 35-40%, HK inf wall and inferolateral wall; mild-mod MR -restarted metoprolol succinate (had been off about 1 year) -cardiology consult-->restart metoprolol and spiro and follow up with Dr. Burt Knack -BP soft precludes added ARB  NASH Liver Cirrhosis  with ascites -holdingtorsemide temporarily due to volume depletionand soft BPs -paracentesis 11/29--not enough ascites to perform  Atypical chest pain -Troponins40>>39 -suspect musculoskeletal pain/GERD -Personally  reviewed EKG--sinus rhythm, nonspecific T wave changes -Continue Imdur if BP allows -GERD  CKD 3a -baseline creatinine 0.9-1.1  Hypokalemia -AddKCl to IV fluids -Repleted -Check magnesium--2.0  Hyponatremia -Secondary to liver cirrhosis and volume depletion -Holding torsemide and spironolactone temporarily  Hematochezia -suspect anorectal source -03/20/17 flex sig--normal sigmoid and rectum; +ext hemorrhoids -GI consult  Persistent atrial fibrillation -EKG shows sinus rhythm -not a candidate for AC due to cirrhosis, esophageal varices, severe thrombocytopenia -continue metoprolol succinate  Essential hypertension -Holding valsartan secondary to soft -continue metoprolol succinate  Hyperlipidemia -Continue Zetiaand statin when able to tolerate po  Pancytopenia -Secondary to liver cirrhosis  Anxiety/depression -Continue fluoxetinewhen able to tolerate po  Lung Nodule -incidential finding of LLL--79m -outpatient surveillance     Status is: Inpatient  Remains inpatient appropriate because:IV treatments appropriate due to intensity of illness or inability to take PO   Dispo: The patient is from:Home Anticipated d/c is tPT:WSFKAnticipated d/c date is:11/30 Patient currently is not medically stable to d/c.        Family Communication:noFamily at bedside  Consultants:GI, cardiology  Code Status: FULL   DVT Prophylaxis: SCDs   Procedures: As Listed in Progress Note Above  Antibiotics: None      Subjective: She ate today without n/v, abd pain.  Denies f/c cp, sob, diarrhea  Objective: Vitals:   01/26/20 2233 01/27/20 0440 01/27/20 1251  01/27/20 1459  BP: (!) 101/49 (!) 109/47 (!) 114/42 (!) 106/45  Pulse: 70 72 76 71  Resp: 16 18 18 18   Temp: 98.2 F (36.8 C) 98 F (36.7 C) 97.8 F (36.6 C) 98.2 F (36.8 C)  TempSrc: Oral  Oral Oral  SpO2: 96% 95% 95% 99%  Weight:      Height:        Intake/Output Summary (Last 24 hours) at 01/27/2020 1806 Last data filed at 01/27/2020 1502 Gross per 24 hour  Intake 480 ml  Output --  Net 480 ml   Weight change:  Exam:   General:  Pt is alert, follows commands appropriately, not in acute distress  HEENT: No icterus, No thrush, No neck mass, [place]/AT  Cardiovascular: RRR, S1/S2, no rubs, no gallops  Respiratory: CTA bilaterally, no wheezing, no crackles, no rhonchi  Abdomen: Soft/+BS, non tender, non distended, no guarding  Extremities: No edema, No lymphangitis, No petechiae, No rashes, no synovitis   Data Reviewed: I have personally reviewed following labs and imaging studies Basic Metabolic Panel: Recent Labs  Lab 01/23/20 1821 01/24/20 0401 01/25/20 0809 01/25/20 0822 01/26/20 1048 01/27/20 0815  NA 130* 134*  --  137 136 136  K 3.5 2.6*  --  4.2 3.2* 2.9*  CL 89* 93*  --  101 100 100  CO2 28 32  --  27 28 27   GLUCOSE 103* 118*  --  86 149* 89  BUN 13 11  --  9 11 11   CREATININE 1.15* 1.12*  --  1.00 1.05* 1.08*  CALCIUM 9.2 8.1*  --  8.2* 8.5* 8.4*  MG  --  1.9 2.0  --   --   --   PHOS  --  3.8  --   --   --   --    Liver Function Tests: Recent Labs  Lab 01/23/20 1821 01/24/20 0401 01/25/20 0822 01/26/20 1048 01/27/20 0815  AST 86* 54* 62* 62* 49*  ALT 24 19 21 23 19   ALKPHOS 94 64 57 67 64  BILITOT 6.7* 5.2* 5.1* 4.7* 4.0*  PROT 7.5  5.7* 5.6* 6.0* 5.6*  ALBUMIN 3.1* 2.4* 2.3* 2.5* 2.2*   Recent Labs  Lab 01/23/20 1821  LIPASE 50   Recent Labs  Lab 01/23/20 1821  AMMONIA 43*   Coagulation Profile: Recent Labs  Lab 01/24/20 0401 01/25/20 0809 01/26/20 1048 01/27/20 0815  INR 1.9* 1.9* 1.6* 1.9*   CBC: Recent Labs  Lab  01/24/20 0401 01/24/20 1058 01/25/20 1422 01/26/20 1048 01/27/20 0815  WBC 3.4* 4.1 3.3* 3.4* 3.1*  HGB 11.8* 13.0 11.9* 12.7 11.8*  HCT 36.2 39.8 36.9 39.3 36.5  MCV 96.5 98.8 99.2 99.5 98.6  PLT 38* 41* 38* 43* 38*   Cardiac Enzymes: No results for input(s): CKTOTAL, CKMB, CKMBINDEX, TROPONINI in the last 168 hours. BNP: Invalid input(s): POCBNP CBG: Recent Labs  Lab 01/25/20 1630  GLUCAP 169*   HbA1C: No results for input(s): HGBA1C in the last 72 hours. Urine analysis:    Component Value Date/Time   COLORURINE YELLOW 05/19/2019 2015   APPEARANCEUR HAZY (A) 05/19/2019 2015   LABSPEC 1.013 05/19/2019 2015   PHURINE 5.0 05/19/2019 2015   GLUCOSEU NEGATIVE 05/19/2019 2015   HGBUR NEGATIVE 05/19/2019 2015   [place] NEGATIVE 05/19/2019 2015   KETONESUR NEGATIVE 05/19/2019 2015   PROTEINUR NEGATIVE 05/19/2019 2015   NITRITE NEGATIVE 05/19/2019 2015   LEUKOCYTESUR NEGATIVE 05/19/2019 2015   Sepsis Labs: @LABRCNTIP (procalcitonin:4,lacticidven:4) ) Recent Results (from the past 240 hour(s))  Resp Panel by RT-PCR (Flu A&B, Covid) Nasopharyngeal Swab     Status: None   Collection Time: 01/23/20  9:41 PM   Specimen: Nasopharyngeal Swab; Nasopharyngeal(NP) swabs in vial transport medium  Result Value Ref Range Status   SARS Coronavirus 2 by RT PCR NEGATIVE NEGATIVE Final    Comment: (NOTE) SARS-CoV-2 target nucleic acids are NOT DETECTED.  The SARS-CoV-2 RNA is generally detectable in upper respiratory specimens during the acute phase of infection. The lowest concentration of SARS-CoV-2 viral copies this assay can detect is 138 copies/mL. A negative result does not preclude SARS-Cov-2 infection and should not be used as the sole basis for treatment or other patient management decisions. A negative result may occur with  improper specimen collection/handling, submission of specimen other than nasopharyngeal swab, presence of viral mutation(s) within the areas  targeted by this assay, and inadequate number of viral copies(<138 copies/mL). A negative result must be combined with clinical observations, patient history, and epidemiological information. The expected result is Negative.  Fact Sheet for Patients:  EntrepreneurPulse.com.au  Fact Sheet for Healthcare Providers:  IncredibleEmployment.be  This test is no t yet approved or cleared by the [place] FDA and  has been authorized for detection and/or diagnosis of SARS-CoV-2 by FDA under an Emergency Use Authorization (EUA). This EUA will remain  in effect (meaning this test can be used) for the duration of the COVID-19 declaration under Section 564(b)(1) of the Act, 21 U.S.C.section 360bbb-3(b)(1), unless the authorization is terminated  or revoked sooner.       Influenza A by PCR NEGATIVE NEGATIVE Final   Influenza B by PCR NEGATIVE NEGATIVE Final    Comment: (NOTE) The Xpert Xpress SARS-CoV-2/FLU/RSV plus assay is intended as an aid in the diagnosis of influenza from Nasopharyngeal swab specimens and should not be used as a sole basis for treatment. Nasal washings and aspirates are unacceptable for Xpert Xpress SARS-CoV-2/FLU/RSV testing.  Fact Sheet for Patients: EntrepreneurPulse.com.au  Fact Sheet for Healthcare Providers: IncredibleEmployment.be  This test is not yet approved or cleared by the [place] FDA and has been  authorized for detection and/or diagnosis of SARS-CoV-2 by FDA under an Emergency Use Authorization (EUA). This EUA will remain in effect (meaning this test can be used) for the duration of the COVID-19 declaration under Section 564(b)(1) of the Act, 21 U.S.C. section 360bbb-3(b)(1), unless the authorization is terminated or revoked.  Performed at [facility], [address] [place], [place] [address]      Scheduled Meds: . ipratropium  1 spray Each Nare Daily  .  isosorbide mononitrate  30 mg Oral Daily  . metoprolol succinate  12.5 mg Oral Daily  . pantoprazole (PROTONIX) IV  40 mg Intravenous Q12H  . spironolactone  12.5 mg Oral Daily   Continuous Infusions:  Procedures/Studies: CT ABDOMEN PELVIS W CONTRAST  Result Date: 01/24/2020 CLINICAL DATA:  Abdominal pain and fever. EXAM: CT ABDOMEN AND PELVIS WITH CONTRAST TECHNIQUE: Multidetector CT imaging of the abdomen and pelvis was performed using the standard protocol following bolus administration of intravenous contrast. CONTRAST:  66m OMNIPAQUE IOHEXOL 300 MG/ML  SOLN COMPARISON:  CT angiography of the abdomen pelvis 05/23/2019 FINDINGS: Lower chest: Subsegmental atelectasis versus scar noted within the right middle lobe and both posterior lower lobes.6 mm ground-glass nodule is identified within the left lower lobe. This is new when compared with 05/23/2019. Hepatobiliary: Advanced changes of cirrhosis. The liver has a shrunken and nodular appearance. No focal liver abnormality identified. Gallbladder is unremarkable. Tiny stones within the dependent portion of the gallbladder suspected. No signs of biliary ductal dilatation or acute cholecystitis. Pancreas: Unremarkable. No pancreatic ductal dilatation or surrounding inflammatory changes. Spleen: Enlarged measuring 17.5 by 11.3 x 6.5 cm (volume = 670 cm^3) Adrenals/Urinary Tract: Normal appearance of the adrenal glands. No kidney mass or hydronephrosis identified. Urinary bladder is unremarkable. Stomach/Bowel: Stomach appears nondistended. The appendix is visualized and appears normal. No small bowel wall thickening, inflammation or distension. There is mild wall thickening involving the ascending colon, nonspecific in the setting of cirrhosis, ascites and portal venous hypertension. Sigmoid diverticulosis noted without acute inflammation. Vascular/Lymphatic: Aortic atherosclerosis. No aneurysm. Distal esophageal varices noted. There also large gastric and  umbilical varices. No abdominal no pelvic adenopathy. Varices. Reproductive: Status post hysterectomy. No adnexal masses. Other: New upper abdominal ascites overlying the spleen and liver. No discrete fluid collections identified. Musculoskeletal: Previous right hip arthroplasty. Status post vertebral augmentation at L1. Interbody spacers are noted at L4-5 and L5-S1. IMPRESSION: 1. No acute findings identified within the abdomen or pelvis. 2. Advanced changes of cirrhosis and portal venous hypertension. New upper abdominal ascites. 3. New 6 mm ground-glass nodule identified within the left lower lobe. Likely postinflammatory Initial follow-up with CT at 6-12 months is recommended to confirm persistence. If persistent, repeat CT is recommended every 2 years until 5 years of stability has been established. This recommendation follows the consensus statement: Guidelines for Management of Incidental Pulmonary Nodules Detected on CT Images: From the Fleischner Society 2017; Radiology 2017; 284:228-243. 4. Aortic atherosclerosis. Aortic Atherosclerosis (ICD10-I70.0). Electronically Signed   By: TKerby MoorsM.D.   On: 01/24/2020 14:14   UK[place]Venous Img Lower Bilateral (DVT)  Result Date: 01/24/2020 CLINICAL DATA:  Left leg swelling. EXAM: BILATERAL LOWER EXTREMITY VENOUS DOPPLER ULTRASOUND TECHNIQUE: Gray-scale sonography with graded compression, as well as color Doppler and duplex ultrasound were performed to evaluate the lower extremity deep venous systems from the level of the common femoral vein and including the common femoral, femoral, profunda femoral, popliteal and calf veins including the posterior tibial, peroneal and gastrocnemius veins when visible. The  superficial great saphenous vein was also interrogated. Spectral Doppler was utilized to evaluate flow at rest and with distal augmentation maneuvers in the common femoral, femoral and popliteal veins. COMPARISON:  None. FINDINGS: RIGHT LOWER EXTREMITY  Common Femoral Vein: No evidence of thrombus. Normal compressibility, respiratory phasicity and response to augmentation. Saphenofemoral Junction: No evidence of thrombus. Normal compressibility and flow on color Doppler imaging. Profunda Femoral Vein: No evidence of thrombus. Normal compressibility and flow on color Doppler imaging. Femoral Vein: No evidence of thrombus. Normal compressibility, respiratory phasicity and response to augmentation. Popliteal Vein: No evidence of thrombus. Normal compressibility, respiratory phasicity and response to augmentation. Calf Veins: Visualized right deep calf veins are patent without thrombus. Other Findings:  None. LEFT LOWER EXTREMITY Common Femoral Vein: No evidence of thrombus. Normal compressibility, respiratory phasicity and response to augmentation. Saphenofemoral Junction: No evidence of thrombus. Normal compressibility and flow on color Doppler imaging. Profunda Femoral Vein: No evidence of thrombus. Normal compressibility and flow on color Doppler imaging. Femoral Vein: No evidence of thrombus. Normal compressibility, respiratory phasicity and response to augmentation. Popliteal Vein: No evidence of thrombus. Normal compressibility, respiratory phasicity and response to augmentation. Calf Veins: Visualized left deep calf veins are patent without thrombus. Other Findings:  None. IMPRESSION: No evidence of deep venous thrombosis in either lower extremity. Electronically Signed   By: Markus Daft M.D.   On: 01/24/2020 10:54   DG Chest Portable 1 View  Result Date: 01/23/2020 CLINICAL DATA:  Chest pain EXAM: PORTABLE CHEST 1 VIEW COMPARISON:  05/23/2019, 04/17/2018, 08/14/2016 CT 05/17/2019 FINDINGS: Post sternotomy changes. Cardiomegaly with aortic atherosclerosis. Chronic interstitial thickening. No consolidation or effusion. No pneumothorax. IMPRESSION: Cardiomegaly without overt pulmonary edema, pleural effusion or focal airspace disease. Electronically Signed    By: Donavan Foil M.D.   On: 01/23/2020 19:38   ECHOCARDIOGRAM COMPLETE  Result Date: 01/24/2020    ECHOCARDIOGRAM REPORT   Patient Name:   ANGELINA VENARD Date of Exam: 01/24/2020 Medical Rec #:  [id number]      Height:       62.0 in Accession #:    [id number]     Weight:       172.6 lb Date of Birth:  12/04/1950      BSA:          1.796 m Patient Age:    72 years       BP:           107/59 mmHg Patient Gender: F              HR:           94 bpm. Exam Location:  [facility] Procedure: 2D Echo, Cardiac Doppler and Color Doppler Indications:    Chest Pain 786.50 / R07.9  History:        Patient has prior history of Echocardiogram examinations. CHF,                 CAD, Prior CABG, Arrythmias:Atrial Fibrillation; Risk                 Factors:Hypertension and Dyslipidemia. GERD, Obesity.  Sonographer:    Alvino Chapel RCS Referring Phys: [phone] Kaitlyn Franko IMPRESSIONS  1. Left ventricular ejection fraction, by estimation, is 35 to 40%. The left ventricle has moderately decreased function. The left ventricle demonstrates global hypokinesis. Left ventricular diastolic function could not be evaluated. There is severe hypokinesis of the left ventricular, entire inferior wall and inferolateral wall.  2. Right ventricular systolic  function is mildly reduced. The right ventricular size is normal. There is mildly elevated pulmonary artery systolic pressure.  3. Left atrial size was severely dilated.  4. Right atrial size was severely dilated.  5. The mitral valve is normal in structure. Mild to moderate mitral valve regurgitation.  6. Tricuspid valve regurgitation is moderate.  7. The aortic valve is tricuspid. Aortic valve regurgitation is not visualized. Mild aortic valve sclerosis is present, with no evidence of aortic valve stenosis.  8. The inferior vena cava is normal in size with <50% respiratory variability, suggesting right atrial pressure of 8 mmHg. Comparison(s): A prior study was performed on 05/20/2019. The left  ventricular function is worsened. The left ventricular wall motion abnormalities are worse. FINDINGS  Left Ventricle: Left ventricular ejection fraction, by estimation, is 35 to 40%. The left ventricle has moderately decreased function. The left ventricle demonstrates global hypokinesis. Severe hypokinesis of the left ventricular, entire inferior wall and inferolateral wall. The left ventricular internal cavity size was normal in size. There is no left ventricular hypertrophy. Abnormal (paradoxical) septal motion consistent with post-operative status. Left ventricular diastolic function could not be evaluated due to atrial fibrillation. Left ventricular diastolic function could not be evaluated. Right Ventricle: The right ventricular size is normal. No increase in right ventricular wall thickness. Right ventricular systolic function is mildly reduced. There is mildly elevated pulmonary artery systolic pressure. The tricuspid regurgitant velocity  is 2.75 m/s, and with an assumed right atrial pressure of 8 mmHg, the estimated right ventricular systolic pressure is 70.6 mmHg. Left Atrium: Left atrial size was severely dilated. Right Atrium: Right atrial size was severely dilated. Pericardium: There is no evidence of pericardial effusion. Mitral Valve: The mitral valve is normal in structure. Mild mitral annular calcification. Mild to moderate mitral valve regurgitation. Tricuspid Valve: The tricuspid valve is normal in structure. Tricuspid valve regurgitation is moderate. Aortic Valve: The aortic valve is tricuspid. Aortic valve regurgitation is not visualized. Mild aortic valve sclerosis is present, with no evidence of aortic valve stenosis. Pulmonic Valve: The pulmonic valve was normal in structure. Pulmonic valve regurgitation is not visualized. Aorta: The aortic root and ascending aorta are structurally normal, with no evidence of dilitation. Venous: The inferior vena cava is normal in size with less than 50%  respiratory variability, suggesting right atrial pressure of 8 mmHg. IAS/Shunts: No atrial level shunt detected by color flow Doppler.  LEFT VENTRICLE PLAX 2D LVIDd:         5.00 cm  Diastology LV PW:         1.10 cm  LV e' medial:    9.57 cm/s LV IVS:        1.00 cm  LV E/e' medial:  16.1 LVOT diam:     1.80 cm  LV e' lateral:   14.30 cm/s LV SV:         56       LV E/e' lateral: 10.8 LV SV Index:   31 LVOT Area:     2.54 cm  RIGHT VENTRICLE RV S prime:     9.25 cm/s TAPSE (M-mode): 2.0 cm LEFT ATRIUM             Index       RIGHT ATRIUM           Index LA Vol (A2C):   86.2 ml 48.00 ml/m RA Area:     23.80 cm LA Vol (A4C):   81.9 ml 45.61 ml/m RA Volume:   75.40  ml  41.99 ml/m LA Biplane Vol: 85.3 ml 47.50 ml/m  AORTIC VALVE LVOT Vmax:   99.20 cm/s LVOT Vmean:  64.300 cm/s LVOT VTI:    0.221 m  AORTA Ao Root diam: 4.90 cm MITRAL VALVE                TRICUSPID VALVE MV Area (PHT): 3.97 cm     TR Peak grad:   30.2 mmHg MV Decel Time: 191 msec     TR Vmax:        275.00 cm/s MV E velocity: 154.00 cm/s                             SHUNTS                             Systemic VTI:  0.22 m                             Systemic Diam: 1.80 cm Dani Gobble Croitoru MD Electronically signed by Sanda Klein MD Signature Date/Time: 01/24/2020/4:45:09 PM    Final    US LIVER DOPPLER  Result Date: 01/27/2020 CLINICAL DATA:  Cirrhosis. EXAM: DUPLEX ULTRASOUND OF LIVER TECHNIQUE: Color and duplex Doppler ultrasound was performed to evaluate the hepatic in-flow and out-flow vessels. COMPARISON:  CT abdomen 01/24/2020 FINDINGS: Liver: Liver is slightly heterogeneous with a nodular contour. Findings are compatible with cirrhosis. No discrete liver lesion. Small amount of perihepatic ascites. Main Portal Vein size: 1.5 cm Portal Vein Velocities Main Prox:  32 cm/sec Main Mid: 26 cm/sec Main Dist:  25 cm/sec Right: 20 cm/sec Left: 80 cm/sec Hepatic Vein Velocities Right:  49 cm/sec Middle:  70 cm/sec Left:  69 cm/sec IVC: Present  and patent with normal respiratory phasicity. Hepatic Artery Velocity:  109 cm/sec Splenic Vein Velocity:  24 cm/sec Spleen: 15.8 cm x 15.7 cm x 7.6 cm with a total volume of 991 cm^3 (411 cm^3 is upper limit normal) Portal Vein Occlusion/Thrombus: No Splenic Vein Occlusion/Thrombus: No Ascites: Present Varices: Present Normal hepatofugal flow in the hepatic veins. Normal hepatopetal flow in the portal veins. Enlarged varices at the umbilicus and this corresponds with the previous CT findings. IMPRESSION: 1. 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 2. Portal venous system is patent with normal direction of flow. Electronically Signed   By: Markus Daft M.D.   On: 01/27/2020 13:52    Orson Eva, DO  [facility]  If 7PM-7AM, please contact night-coverage [email or web address] Password [email or web address] 01/27/2020, 6:06 PM   LOS: 4 days

## 2020-01-27 NOTE — Consult Note (Addendum)
Cardiology Consultation:   Patient ID: Samantha [name]; [id number]; 09-Feb-1951   Admit date: 01/23/2020 Date of Consult: 01/27/2020  Primary Care Provider: [name], MD Primary Cardiologist: Sherren Mocha, MD Primary Electrophysiologist: None   Patient Profile:   Samantha [name] is a 69 y.o. female with a history of with a history of atrial fibrillation and flutter, hypertension, cirrhosis of the liver with esophageal varices, and CAD status post CABG with associated cardiomyopathy who is being seen today for the evaluation of cardiomyopathy at the request of Dr. Carles Collet.  History of Present Illness:   [name] is currently admitted to the hospital with recent nausea, emesis, and diarrhea felt to be related to gastroenteritis.  She does have [name] cirrhosis with portal gastropathy and esophageal varices followed by Dr. Laural Golden.  She was initially n.p.o. but diet has been advanced as she is feeling better with supportive measures.  She does have evidence of ascites with pending abdominal ultrasound with potential paracentesis following consultation with GI.  During current hospital evaluation an echocardiogram was obtained which revealed LVEF 35 to 40% with wall motion abnormalities consistent with ischemic cardiomyopathy, mildly reduced RV contraction, severe biatrial enlargement, mild to moderate mitral regurgitation, and moderate tricuspid regurgitation.  Prior LVEF reported at 50 to 55% as of March, but has been lower in the past as well.  She does not report any breathlessness at rest, or recent chest pain or palpitations.  She is currently in sinus rhythm by ECG.  Past Medical History:  Diagnosis Date  . Cataract    OU  . Chronic combined systolic and diastolic CHF (congestive heart failure) ([place])   . Cirrhosis of liver ([place])   . CKD (chronic kidney disease), stage III ([place])   . Coronary artery disease    a. s/p CABG 2001 w/ (LIMA-OM, SVG-D1, SVG-RCA). b. h/o multiple PCIs per  Dr. Antionette Char note.  . Depression   . Esophageal varices ([place])    New 2013  . Gastroesophageal reflux disease   . History of pneumonia   . Hyperlipidemia   . Hypertension   . Hypertensive retinopathy    OU  . Obesity   . Osteoarthritis   . Pancytopenia ([place])   . Paroxysmal atrial flutter ([place])    a. dx 05/2016.  [name] Persistent atrial fibrillation ([place])    a. reported in hosp 07/2016, not on anticoag due to cirrhosis and liver disease, low platelets, varices.  . Thrombocytopenia ([place])     Past Surgical History:  Procedure Laterality Date  . ABDOMINAL HYSTERECTOMY    . BACK SURGERY    . CARDIAC CATHETERIZATION  2004   left internal mammary artery to the obtuse marginal  was found to be small and thread like.  The two grafts were patent.  The left circumflex had 90% in-stent restenosis and cutting balloon angioplasty was performed followed by placement of a 3.0 x 17m Taxus drug -eluting stent.    .Marland KitchenCARDIAC CATHETERIZATION  2006   There was in-stent restenosis in the left circumflex and this was treated with cutting balloon angioplasty   . CARDIAC CATHETERIZATION  2008   vein graft to the to the obtuse marginal was patent, although small, left circumflex had 40% in-stent restenosis, ejection fraction 40-45%.  The patient was medically mananged.  .Marland KitchenCARDIAC CATHETERIZATION N/A 01/09/2015   Procedure: Left Heart Cath and Cors/Grafts Angiography;  Surgeon: HBelva Crome MD;  [facility];  Service: Cardiovascular;  Laterality: N/A;  .  CATARACT EXTRACTION W/PHACO Right 05/17/2019   Procedure: CATARACT EXTRACTION PHACO AND INTRAOCULAR LENS PLACEMENT (IOC) (CDE: 14.99);  Surgeon: Baruch Goldmann, MD;  Location: [facility];  Service: Ophthalmology;  Laterality: Right;  . CATARACT EXTRACTION W/PHACO Left 06/10/2019   Procedure: CATARACT EXTRACTION PHACO AND INTRAOCULAR LENS PLACEMENT (IOC);  Surgeon: Baruch Goldmann, MD;  Location: [facility];  Service: Ophthalmology;  Laterality: Left;  CDE:  11.96  . cataract sx    . COLONOSCOPY  03/29/2011   Procedure: COLONOSCOPY;  Surgeon: Jamesetta So, MD;  [facility];  Service: Gastroenterology;  Laterality: N/A;  . CORONARY ARTERY BYPASS GRAFT  May 31,2001   x 3 with a vein graft to the first diagonal, vein graft to the right coronary  artery, and a free left internal mammary  artery to the obtuse marginal   . ESOPHAGEAL BANDING N/A 07/04/2012   Procedure: ESOPHAGEAL BANDING;  Surgeon: Rogene Houston, MD;  [facility];  Service: Endoscopy;  Laterality: N/A;  . ESOPHAGEAL BANDING N/A 09/17/2012   Procedure: ESOPHAGEAL BANDING;  Surgeon: Rogene Houston, MD;  [facility];  Service: Endoscopy;  Laterality: N/A;  . ESOPHAGEAL BANDING N/A 10/22/2013   Procedure: ESOPHAGEAL BANDING;  Surgeon: Rogene Houston, MD;  [facility];  Service: Endoscopy;  Laterality: N/A;  . ESOPHAGEAL BANDING N/A 11/28/2014   Procedure: ESOPHAGEAL BANDING;  Surgeon: Rogene Houston, MD;  [facility];  Service: Endoscopy;  Laterality: N/A;  . ESOPHAGEAL BANDING N/A 10/29/2015   Procedure: ESOPHAGEAL BANDING;  Surgeon: Rogene Houston, MD;  [facility];  Service: Endoscopy;  Laterality: N/A;  . ESOPHAGOGASTRODUODENOSCOPY  12/20/2011   Procedure: ESOPHAGOGASTRODUODENOSCOPY (EGD);  Surgeon: Jamesetta So, MD;  [facility];  Service: Gastroenterology;  Laterality: N/A;  . ESOPHAGOGASTRODUODENOSCOPY N/A 07/04/2012   Procedure: ESOPHAGOGASTRODUODENOSCOPY (EGD);  Surgeon: Rogene Houston, MD;  [facility];  Service: Endoscopy;  Laterality: N/A;  235-moved to 255 Ann to notify pt  . ESOPHAGOGASTRODUODENOSCOPY N/A 09/17/2012   Procedure: ESOPHAGOGASTRODUODENOSCOPY (EGD);  Surgeon: Rogene Houston, MD;  [facility];  Service: Endoscopy;  Laterality: N/A;  730  . ESOPHAGOGASTRODUODENOSCOPY N/A 10/22/2013   Procedure: ESOPHAGOGASTRODUODENOSCOPY (EGD);  Surgeon: Rogene Houston, MD;   [facility];  Service: Endoscopy;  Laterality: N/A;  730  . ESOPHAGOGASTRODUODENOSCOPY N/A 11/28/2014   Procedure: ESOPHAGOGASTRODUODENOSCOPY (EGD);  Surgeon: Rogene Houston, MD;  [facility];  Service: Endoscopy;  Laterality: N/A;  1:25  . ESOPHAGOGASTRODUODENOSCOPY N/A 10/29/2015   Procedure: ESOPHAGOGASTRODUODENOSCOPY (EGD);  Surgeon: Rogene Houston, MD;  [facility];  Service: Endoscopy;  Laterality: N/A;  12:00  . ESOPHAGOGASTRODUODENOSCOPY N/A 12/12/2016   Grade 1 and 2 varices in lower third of esophagus, post variceal banding scar distal esophagus, mild portal hypertensive gastropathy, Type 1 gastroesophageal varices without bleeding, normal duodenum and second portion of duodenum.   [name] FLEXIBLE SIGMOIDOSCOPY N/A 03/20/2017   external hemorrhoids  . JOINT REPLACEMENT    . LEFT HEART CATH AND CORS/GRAFTS ANGIOGRAPHY N/A 08/15/2016   Procedure: Left Heart Cath and Cors/Grafts Angiography;  Surgeon: Jettie Booze, MD;  Location: [facility];  Service: Cardiovascular;  Laterality: N/A;  . LEFT HEART CATHETERIZATION WITH CORONARY/GRAFT ANGIOGRAM N/A 12/25/2013   Procedure: LEFT HEART CATHETERIZATION WITH Beatrix Fetters;  Surgeon: Blane Ohara, MD;  Location: [facility];  Service: Cardiovascular;  Laterality: N/A;  . RIGHT HEART CATH N/A 05/29/2017  Procedure: RIGHT HEART CATH;  Surgeon: Jolaine Artist, MD;  Location: [place] [facility];  Service: Cardiovascular;  Laterality: N/A;  . rotator cuff left  2007  . TONSILLECTOMY       Inpatient Medications: Scheduled Meds: . ipratropium  1 spray Each Nare Daily  . isosorbide mononitrate  30 mg Oral Daily  . metoprolol succinate  12.5 mg Oral Daily  . pantoprazole (PROTONIX) IV  40 mg Intravenous Q12H    PRN Meds: albuterol, HYDROmorphone (DILAUDID) injection, nitroGLYCERIN, prochlorperazine  Allergies:    Allergies  Allergen Reactions  . Entresto [Sacubitril-Valsartan]  Swelling    Swelling of eyes  . Acetaminophen Other (See Comments)    Liver problems  . Oxycodone Itching and Nausea Only  . Ace Inhibitors Other (See Comments)    UNSURE OF REACTION TYPE   . Cefaclor Itching and Rash  . Cephalexin Itching and Rash  . Penicillins Rash    Has patient had a PCN reaction causing immediate rash, facial/tongue/throat swelling, SOB or lightheadedness with hypotension: YES Has patient had a PCN reaction causing severe rash involving mucus membranes or skin necrosis: NO Has patient had a PCN reaction that required hospitalization: YES Has patient had a PCN reaction occurring within the last 10 years: NO If all of the above answers are "NO", then may proceed with Cephalosporin use.   . Pregabalin Other (See Comments)    Retains fluid  . Tape Itching and Rash    Takes skin right off with medical tape--PAPER TAPE ONLY    Social History:   Social History   Tobacco Use  . Smoking status: Former Smoker    Packs/day: 0.50    Years: 20.00    Pack years: 10.00    Types: Cigarettes    Quit date: 07/21/1995    Years since quitting: 24.5  . Smokeless tobacco: Never Used  Substance Use Topics  . Alcohol use: No    Family History:   The patient's family history includes Coronary artery disease in her sister; Diabetes in her brother, father, mother, and sister; Glaucoma in her sister; Heart attack in her mother; Heart attack (age of onset: 40) in her father. There is no history of Colon cancer.  ROS:  Please see the history of present illness.  No orthopnea or PND.     Physical Exam/Data:   Vitals:   01/26/20 1549 01/26/20 2128 01/26/20 2233 01/27/20 0440  BP: (!) 97/43  (!) 101/49 (!) 109/47  Pulse: 82  70 72  Resp: 18  16 18   Temp: 98 F (36.7 C)  98.2 F (36.8 C) 98 F (36.7 C)  TempSrc: Oral  Oral   SpO2: 93% (!) 85% 96% 95%  Weight:      Height:        Intake/Output Summary (Last 24 hours) at 01/27/2020 1045 Last data filed at 01/26/2020  1300 Gross per 24 hour  Intake 240 ml  Output --  Net 240 ml   Filed Weights   01/23/20 1815 01/24/20 1109  Weight: 72.6 kg 78.3 kg   Body mass index is 31.57 kg/m.   Gen: Patient appears comfortable at rest. HEENT: Conjunctiva and lids normal, oropharynx clear. Neck: Supple, no elevated JVP or carotid bruits, no thyromegaly. Lungs: Clear to auscultation, nonlabored breathing at rest. Cardiac: Regular rate and rhythm, no S3, 2/6 apical systolic murmur. Abdomen: Protuberant, bowel sounds present. Extremities: No pitting edema. Skin: Warm and dry. Musculoskeletal: No kyphosis. Neuropsychiatric: Alert and oriented x3, affect grossly  appropriate.  EKG:  An ECG dated 01/23/2020 was personally reviewed today and demonstrated:  Sinus rhythm with PACs and left bundle branch block.  Telemetry:  I personally reviewed telemetry which shows sinus rhythm with atrial ectopy.  Relevant CV Studies:  Echocardiogram 01/24/2020: 1. Left ventricular ejection fraction, by estimation, is 35 to 40%. The  left ventricle has moderately decreased function. The left ventricle  demonstrates global hypokinesis. Left ventricular diastolic function could  not be evaluated. There is severe  hypokinesis of the left ventricular, entire inferior wall and  inferolateral wall.  2. Right ventricular systolic function is mildly reduced. The right  ventricular size is normal. There is mildly elevated pulmonary artery  systolic pressure.  3. Left atrial size was severely dilated.  4. Right atrial size was severely dilated.  5. The mitral valve is normal in structure. Mild to moderate mitral valve  regurgitation.  6. Tricuspid valve regurgitation is moderate.  7. The aortic valve is tricuspid. Aortic valve regurgitation is not  visualized. Mild aortic valve sclerosis is present, with no evidence of  aortic valve stenosis.  8. The inferior vena cava is normal in size with <50% respiratory  variability,  suggesting right atrial pressure of 8 mmHg.   Laboratory Data:  Chemistry Recent Labs  Lab 01/25/20 0822 01/26/20 1048 01/27/20 0815  NA 137 136 136  K 4.2 3.2* 2.9*  CL 101 100 100  CO2 27 28 27   GLUCOSE 86 149* 89  BUN 9 11 11   CREATININE 1.00 1.05* 1.08*  CALCIUM 8.2* 8.5* 8.4*  GFRNONAA >60 57* 55*  ANIONGAP 9 8 9     Recent Labs  Lab 01/25/20 0822 01/26/20 1048 01/27/20 0815  PROT 5.6* 6.0* 5.6*  ALBUMIN 2.3* 2.5* 2.2*  AST 62* 62* 49*  ALT 21 23 19   ALKPHOS 57 67 64  BILITOT 5.1* 4.7* 4.0*   Hematology Recent Labs  Lab 01/25/20 1422 01/26/20 1048 01/27/20 0815  WBC 3.3* 3.4* 3.1*  RBC 3.72* 3.95 3.70*  HGB 11.9* 12.7 11.8*  HCT 36.9 39.3 36.5  MCV 99.2 99.5 98.6  MCH 32.0 32.2 31.9  MCHC 32.2 32.3 32.3  RDW 17.6* 18.0* 17.9*  PLT 38* 43* 38*   Cardiac Enzymes Recent Labs  Lab 01/23/20 1821 01/23/20 2029  TROPONINIHS 40* 39*   BNP Recent Labs  Lab 01/23/20 1821  BNP 175.0*     Radiology/Studies:  CT ABDOMEN PELVIS W CONTRAST  Result Date: 01/24/2020 CLINICAL DATA:  Abdominal pain and fever. EXAM: CT ABDOMEN AND PELVIS WITH CONTRAST TECHNIQUE: Multidetector CT imaging of the abdomen and pelvis was performed using the standard protocol following bolus administration of intravenous contrast. CONTRAST:  60m OMNIPAQUE IOHEXOL 300 MG/ML  SOLN COMPARISON:  CT angiography of the abdomen pelvis 05/23/2019 FINDINGS: Lower chest: Subsegmental atelectasis versus scar noted within the right middle lobe and both posterior lower lobes.6 mm ground-glass nodule is identified within the left lower lobe. This is new when compared with 05/23/2019. Hepatobiliary: Advanced changes of cirrhosis. The liver has a shrunken and nodular appearance. No focal liver abnormality identified. Gallbladder is unremarkable. Tiny stones within the dependent portion of the gallbladder suspected. No signs of biliary ductal dilatation or acute cholecystitis. Pancreas: Unremarkable. No  pancreatic ductal dilatation or surrounding inflammatory changes. Spleen: Enlarged measuring 17.5 by 11.3 x 6.5 cm (volume = 670 cm^3) Adrenals/Urinary Tract: Normal appearance of the adrenal glands. No kidney mass or hydronephrosis identified. Urinary bladder is unremarkable. Stomach/Bowel: Stomach appears nondistended. The appendix is visualized and  appears normal. No small bowel wall thickening, inflammation or distension. There is mild wall thickening involving the ascending colon, nonspecific in the setting of cirrhosis, ascites and portal venous hypertension. Sigmoid diverticulosis noted without acute inflammation. Vascular/Lymphatic: Aortic atherosclerosis. No aneurysm. Distal esophageal varices noted. There also large gastric and umbilical varices. No abdominal no pelvic adenopathy. Varices. Reproductive: Status post hysterectomy. No adnexal masses. Other: New upper abdominal ascites overlying the spleen and liver. No discrete fluid collections identified. Musculoskeletal: Previous right hip arthroplasty. Status post vertebral augmentation at L1. Interbody spacers are noted at L4-5 and L5-S1. IMPRESSION: 1. No acute findings identified within the abdomen or pelvis. 2. Advanced changes of cirrhosis and portal venous hypertension. New upper abdominal ascites. 3. New 6 mm ground-glass nodule identified within the left lower lobe. Likely postinflammatory Initial follow-up with CT at 6-12 months is recommended to confirm persistence. If persistent, repeat CT is recommended every 2 years until 5 years of stability has been established. This recommendation follows the consensus statement: Guidelines for Management of Incidental Pulmonary Nodules Detected on CT Images: From the Fleischner Society 2017; Radiology 2017; 284:228-243. 4. Aortic atherosclerosis. Aortic Atherosclerosis (ICD10-I70.0). Electronically Signed   By: Kerby Moors M.D.   On: 01/24/2020 14:14   US Venous Img Lower Bilateral (DVT)  Result  Date: 01/24/2020 CLINICAL DATA:  Left leg swelling. EXAM: BILATERAL LOWER EXTREMITY VENOUS DOPPLER ULTRASOUND TECHNIQUE: Gray-scale sonography with graded compression, as well as color Doppler and duplex ultrasound were performed to evaluate the lower extremity deep venous systems from the level of the common femoral vein and including the common femoral, femoral, profunda femoral, popliteal and calf veins including the posterior tibial, peroneal and gastrocnemius veins when visible. The superficial great saphenous vein was also interrogated. Spectral Doppler was utilized to evaluate flow at rest and with distal augmentation maneuvers in the common femoral, femoral and popliteal veins. COMPARISON:  None. FINDINGS: RIGHT LOWER EXTREMITY Common Femoral Vein: No evidence of thrombus. Normal compressibility, respiratory phasicity and response to augmentation. Saphenofemoral Junction: No evidence of thrombus. Normal compressibility and flow on color Doppler imaging. Profunda Femoral Vein: No evidence of thrombus. Normal compressibility and flow on color Doppler imaging. Femoral Vein: No evidence of thrombus. Normal compressibility, respiratory phasicity and response to augmentation. Popliteal Vein: No evidence of thrombus. Normal compressibility, respiratory phasicity and response to augmentation. Calf Veins: Visualized right deep calf veins are patent without thrombus. Other Findings:  None. LEFT LOWER EXTREMITY Common Femoral Vein: No evidence of thrombus. Normal compressibility, respiratory phasicity and response to augmentation. Saphenofemoral Junction: No evidence of thrombus. Normal compressibility and flow on color Doppler imaging. Profunda Femoral Vein: No evidence of thrombus. Normal compressibility and flow on color Doppler imaging. Femoral Vein: No evidence of thrombus. Normal compressibility, respiratory phasicity and response to augmentation. Popliteal Vein: No evidence of thrombus. Normal compressibility,  respiratory phasicity and response to augmentation. Calf Veins: Visualized left deep calf veins are patent without thrombus. Other Findings:  None. IMPRESSION: No evidence of deep venous thrombosis in either lower extremity. Electronically Signed   By: Markus Daft M.D.   On: 01/24/2020 10:54   DG Chest Portable 1 View  Result Date: 01/23/2020 CLINICAL DATA:  Chest pain EXAM: PORTABLE CHEST 1 VIEW COMPARISON:  05/23/2019, 04/17/2018, 08/14/2016 CT 05/17/2019 FINDINGS: Post sternotomy changes. Cardiomegaly with aortic atherosclerosis. Chronic interstitial thickening. No consolidation or effusion. No pneumothorax. IMPRESSION: Cardiomegaly without overt pulmonary edema, pleural effusion or focal airspace disease. Electronically Signed   By: Donavan Foil M.D.   On:  01/23/2020 19:38   ECHOCARDIOGRAM COMPLETE  Result Date: 01/24/2020    ECHOCARDIOGRAM REPORT   Patient Name:   MIEL WISENER Date of Exam: 01/24/2020 Medical Rec #:  [id number]      Height:       62.0 in Accession #:    [id number]     Weight:       172.6 lb Date of Birth:  07/30/1950      BSA:          1.796 m Patient Age:    52 years       BP:           107/59 mmHg Patient Gender: F              HR:           94 bpm. Exam Location:  [facility] Procedure: 2D Echo, Cardiac Doppler and Color Doppler Indications:    Chest Pain 786.50 / R07.9  History:        Patient has prior history of Echocardiogram examinations. CHF,                 CAD, Prior CABG, Arrythmias:Atrial Fibrillation; Risk                 Factors:Hypertension and Dyslipidemia. GERD, Obesity.  Sonographer:    Alvino Chapel RCS Referring Phys: [phone] DAVID TAT IMPRESSIONS  1. Left ventricular ejection fraction, by estimation, is 35 to 40%. The left ventricle has moderately decreased function. The left ventricle demonstrates global hypokinesis. Left ventricular diastolic function could not be evaluated. There is severe hypokinesis of the left ventricular, entire inferior wall and  inferolateral wall.  2. Right ventricular systolic function is mildly reduced. The right ventricular size is normal. There is mildly elevated pulmonary artery systolic pressure.  3. Left atrial size was severely dilated.  4. Right atrial size was severely dilated.  5. The mitral valve is normal in structure. Mild to moderate mitral valve regurgitation.  6. Tricuspid valve regurgitation is moderate.  7. The aortic valve is tricuspid. Aortic valve regurgitation is not visualized. Mild aortic valve sclerosis is present, with no evidence of aortic valve stenosis.  8. The inferior vena cava is normal in size with <50% respiratory variability, suggesting right atrial pressure of 8 mmHg. Comparison(s): A prior study was performed on 05/20/2019. The left ventricular function is worsened. The left ventricular wall motion abnormalities are worse. FINDINGS  Left Ventricle: Left ventricular ejection fraction, by estimation, is 35 to 40%. The left ventricle has moderately decreased function. The left ventricle demonstrates global hypokinesis. Severe hypokinesis of the left ventricular, entire inferior wall and inferolateral wall. The left ventricular internal cavity size was normal in size. There is no left ventricular hypertrophy. Abnormal (paradoxical) septal motion consistent with post-operative status. Left ventricular diastolic function could not be evaluated due to atrial fibrillation. Left ventricular diastolic function could not be evaluated. Right Ventricle: The right ventricular size is normal. No increase in right ventricular wall thickness. Right ventricular systolic function is mildly reduced. There is mildly elevated pulmonary artery systolic pressure. The tricuspid regurgitant velocity  is 2.75 m/s, and with an assumed right atrial pressure of 8 mmHg, the estimated right ventricular systolic pressure is 21.1 mmHg. Left Atrium: Left atrial size was severely dilated. Right Atrium: Right atrial size was severely  dilated. Pericardium: There is no evidence of pericardial effusion. Mitral Valve: The mitral valve is normal in structure. Mild mitral annular calcification. Mild to moderate mitral valve  regurgitation. Tricuspid Valve: The tricuspid valve is normal in structure. Tricuspid valve regurgitation is moderate. Aortic Valve: The aortic valve is tricuspid. Aortic valve regurgitation is not visualized. Mild aortic valve sclerosis is present, with no evidence of aortic valve stenosis. Pulmonic Valve: The pulmonic valve was normal in structure. Pulmonic valve regurgitation is not visualized. Aorta: The aortic root and ascending aorta are structurally normal, with no evidence of dilitation. Venous: The inferior vena cava is normal in size with less than 50% respiratory variability, suggesting right atrial pressure of 8 mmHg. IAS/Shunts: No atrial level shunt detected by color flow Doppler.  LEFT VENTRICLE PLAX 2D LVIDd:         5.00 cm  Diastology LV PW:         1.10 cm  LV e' medial:    9.57 cm/s LV IVS:        1.00 cm  LV E/e' medial:  16.1 LVOT diam:     1.80 cm  LV e' lateral:   14.30 cm/s LV SV:         56       LV E/e' lateral: 10.8 LV SV Index:   31 LVOT Area:     2.54 cm  RIGHT VENTRICLE RV S prime:     9.25 cm/s TAPSE (M-mode): 2.0 cm LEFT ATRIUM             Index       RIGHT ATRIUM           Index LA Vol (A2C):   86.2 ml 48.00 ml/m RA Area:     23.80 cm LA Vol (A4C):   81.9 ml 45.61 ml/m RA Volume:   75.40 ml  41.99 ml/m LA Biplane Vol: 85.3 ml 47.50 ml/m  AORTIC VALVE LVOT Vmax:   99.20 cm/s LVOT Vmean:  64.300 cm/s LVOT VTI:    0.221 m  AORTA Ao Root diam: 4.90 cm MITRAL VALVE                TRICUSPID VALVE MV Area (PHT): 3.97 cm     TR Peak grad:   30.2 mmHg MV Decel Time: 191 msec     TR Vmax:        275.00 cm/s MV E velocity: 154.00 cm/s                             SHUNTS                             Systemic VTI:  0.22 m                             Systemic Diam: 1.80 cm Dani Gobble Croitoru MD Electronically  signed by Sanda Klein MD Signature Date/Time: 01/24/2020/4:45:09 PM    Final     Assessment and Plan:   1.  Ischemic cardiomyopathy by history, recent follow-up LVEF 35 to 40% range, previously 50 to 55% as of evaluation in March.  She does not report any recent progressive shortness of breath in the setting of comorbid illness including [name] cirrhosis with portal gastropathy and esophageal varices.  She was seen by Dr. Burt Knack in September, I reviewed the note.  Per review of the chart outpatient regimen has included Diovan, Demadex, Aldactone, Toprol-XL, Imdur, and Lipitor.  Not clear that she has been on beta-blocker recently however.  2.  Multivessel CAD status post CABG and multiple PCI.  No active angina reported at this time.  She has not been on antiplatelet regimen due to concern for high risk of bleeding with esophageal varices and thrombocytopenia.  3.  History of persistent atrial fibrillation, currently in sinus rhythm.  She has not been anticoagulated long-term given concern for high risk of bleeding with esophageal varices and thrombocytopenia.  4.  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  She follows with Dr. Laural Golden.  5.  CKD stage III by history, current creatinine 1.08 with GFR 55.  I reviewed the patient's cardiac history and her recent visit note per Dr. Burt Knack in September.  Change in LVEF noted, but would still manage medically at this point.  Agree with resumption of Toprol-XL and Imdur.  No antiplatelet or anticoagulant medications as discussed above.  Blood pressure low normal and would therefore hold off on ARB/ARI.  Could resume Aldactone however at 12.5 mg daily.  No further inpatient cardiac testing is planned.  Would please get her scheduled to see Dr. Burt Knack or APP in the [place] office 2 to 4 weeks after discharge.  We will sign off.  Signed, Rozann Lesches, MD  01/27/2020 10:45 AM

## 2020-01-27 NOTE — Progress Notes (Signed)
    Subjective: No abdominal pain, nausea, vomiting, overt GI bleeding, diarrhea. Does not feel distended. Tolerating diet.   Objective: Vital signs in last 24 hours: Temp:  [98 F (36.7 C)-98.2 F (36.8 C)] 98 F (36.7 C) (11/29 0440) Pulse Rate:  [70-82] 72 (11/29 0440) Resp:  [16-18] 18 (11/29 0440) BP: (97-109)/(43-49) 109/47 (11/29 0440) SpO2:  [85 %-96 %] 95 % (11/29 0440) Last BM Date: 01/25/20 General:   Alert and oriented, pleasant Head:  Normocephalic and atraumatic. Abdomen:  Bowel sounds present, soft, non-tender, non-distended. No HSM or hernias noted. No rebound or guarding. No masses appreciated  Msk:  Symmetrical without gross deformities. Normal posture. Extremities:  Without  edema. Neurologic:  Alert and  oriented x4 Psych:   Normal mood and affect.  Intake/Output from previous day: 11/28 0701 - 11/29 0700 In: 480 [P.O.:480] Out: -  Intake/Output this shift: No intake/output data recorded.  Lab Results: Recent Labs    01/24/20 1058 01/25/20 1422 01/26/20 1048  WBC 4.1 3.3* 3.4*  HGB 13.0 11.9* 12.7  HCT 39.8 36.9 39.3  PLT 41* 38* 43*   BMET Recent Labs    01/25/20 0822 01/26/20 1048  NA 137 136  K 4.2 3.2*  CL 101 100  CO2 27 28  GLUCOSE 86 149*  BUN 9 11  CREATININE 1.00 1.05*  CALCIUM 8.2* 8.5*   LFT Recent Labs    01/25/20 0822 01/26/20 1048  PROT 5.6* 6.0*  ALBUMIN 2.3* 2.5*  AST 62* 62*  ALT 21 23  ALKPHOS 57 67  BILITOT 5.1* 4.7*   PT/INR Recent Labs    01/25/20 0809 01/26/20 1048  LABPROT 21.1* 18.6*  INR 1.9* 1.6*    Assessment: 69 year old female with history of NASH cirrhosis, portal gastropathy, Grade 1 and 2 esophageal varices, gastric varices (EGD 2018), presenting with nasuea, vomiting, diarrhea felt to be related to self-limiting gastroenteritis. No stool studies as diarrhea resolved.  Cirrhosis: MELD Na 21 today. Increase in LFTs during admission from baseline but now trending down. Likely due to acute  illness.  INR fluctuating, 1.9 today, slightly increased from yesterday but similar to several days ago. CT 11/26 with new upper abdominal ascites. On exam, she does not have tense ascites, and I query whether she will be able to have para completed, which has already been ordered. Continue with plans for para if possible, with fluid analysis. No signs/symptoms of SBP. Doppler ultrasound scheduled as well.   Lung nodule on CT: follow-up with PCP as outpatient with CT for surveillance in 6-12 months.   Hypokalemia: potassium 2.9 today. Repletion per attending.   Plan: [place] with para if possible today and fluid analysis Doppler ultrasound of liver Follow INR, HFP Potassium repletion per attending PPI BID Further outpatient cirrhosis care with Dr. Chana Bode, PhD, ANP-BC [facility]     LOS: 4 days    01/27/2020, 8:07 AM

## 2020-01-28 DIAGNOSIS — R0789 Other chest pain: Secondary | ICD-10-CM | POA: Diagnosis not present

## 2020-01-28 DIAGNOSIS — N183 Chronic kidney disease, stage 3 unspecified: Secondary | ICD-10-CM | POA: Diagnosis not present

## 2020-01-28 DIAGNOSIS — I5023 Acute on chronic systolic (congestive) heart failure: Secondary | ICD-10-CM | POA: Diagnosis not present

## 2020-01-28 DIAGNOSIS — E1122 Type 2 diabetes mellitus with diabetic chronic kidney disease: Secondary | ICD-10-CM | POA: Diagnosis not present

## 2020-01-28 DIAGNOSIS — I5042 Chronic combined systolic (congestive) and diastolic (congestive) heart failure: Secondary | ICD-10-CM | POA: Diagnosis not present

## 2020-01-28 DIAGNOSIS — I130 Hypertensive heart and chronic kidney disease with heart failure and stage 1 through stage 4 chronic kidney disease, or unspecified chronic kidney disease: Secondary | ICD-10-CM | POA: Diagnosis not present

## 2020-01-28 DIAGNOSIS — R112 Nausea with vomiting, unspecified: Secondary | ICD-10-CM | POA: Diagnosis not present

## 2020-01-28 DIAGNOSIS — K7460 Unspecified cirrhosis of liver: Secondary | ICD-10-CM | POA: Diagnosis not present

## 2020-01-28 LAB — COMPREHENSIVE METABOLIC PANEL
ALT: 20 U/L (ref 0–44)
AST: 46 U/L — ABNORMAL HIGH (ref 15–41)
Albumin: 2.3 g/dL — ABNORMAL LOW (ref 3.5–5.0)
Alkaline Phosphatase: 68 U/L (ref 38–126)
Anion gap: 8 (ref 5–15)
BUN: 13 mg/dL (ref 8–23)
CO2: 28 mmol/L (ref 22–32)
Calcium: 8.5 mg/dL — ABNORMAL LOW (ref 8.9–10.3)
Chloride: 100 mmol/L (ref 98–111)
Creatinine, Ser: 1.06 mg/dL — ABNORMAL HIGH (ref 0.44–1.00)
GFR, Estimated: 56 mL/min — ABNORMAL LOW (ref >60)
Glucose, Bld: 92 mg/dL (ref 70–99)
Potassium: 3.4 mmol/L — ABNORMAL LOW (ref 3.5–5.1)
Sodium: 136 mmol/L (ref 135–145)
Total Bilirubin: 3.9 mg/dL — ABNORMAL HIGH (ref 0.3–1.2)
Total Protein: 5.5 g/dL — ABNORMAL LOW (ref 6.5–8.1)

## 2020-01-28 LAB — CBC
HCT: 35.2 % — ABNORMAL LOW (ref 36.0–46.0)
Hemoglobin: 11.6 g/dL — ABNORMAL LOW (ref 12.0–15.0)
MCH: 32.5 pg (ref 26.0–34.0)
MCHC: 33 g/dL (ref 30.0–36.0)
MCV: 98.6 fL (ref 80.0–100.0)
Platelets: 38 10*3/uL — ABNORMAL LOW (ref 150–400)
RBC: 3.57 MIL/uL — ABNORMAL LOW (ref 3.87–5.11)
RDW: 18.2 % — ABNORMAL HIGH (ref 11.5–15.5)
WBC: 3.4 10*3/uL — ABNORMAL LOW (ref 4.0–10.5)
nRBC: 0 % (ref 0.0–0.2)

## 2020-01-28 LAB — PROTIME-INR
INR: 1.8 — ABNORMAL HIGH (ref 0.8–1.2)
Prothrombin Time: 20.3 seconds — ABNORMAL HIGH (ref 11.4–15.2)

## 2020-01-28 MED ORDER — POTASSIUM CHLORIDE CRYS ER 20 MEQ PO TBCR
20.0000 meq | EXTENDED_RELEASE_TABLET | Freq: Once | ORAL | Status: AC
  Administered 2020-01-28: 20 meq via ORAL
  Filled 2020-01-28: qty 1

## 2020-01-28 MED ORDER — ISOSORBIDE MONONITRATE ER 60 MG PO TB24
60.0000 mg | ORAL_TABLET | Freq: Every day | ORAL | 1 refills | Status: DC
Start: 2020-01-29 — End: 2020-02-04

## 2020-01-28 MED ORDER — TORSEMIDE 20 MG PO TABS: 20.0000 mg | ORAL_TABLET | Freq: Every day | ORAL | 3 refills | Status: DC

## 2020-01-28 MED ORDER — SPIRONOLACTONE 25 MG PO TABS
12.5000 mg | ORAL_TABLET | Freq: Every day | ORAL | 1 refills | Status: DC
Start: 2020-01-29 — End: 2020-02-04

## 2020-01-28 MED ORDER — METOPROLOL SUCCINATE ER 25 MG PO TB24
12.5000 mg | ORAL_TABLET | Freq: Every day | ORAL | 1 refills | Status: DC
Start: 2020-01-29 — End: 2020-02-19

## 2020-01-28 NOTE — Discharge Summary (Signed)
Physician Discharge Summary  BRIGET SHAHEED EXH:[id number] DOB: 05-23-1950 DOA: 01/23/2020  PCP: [name], MD  Admit date: 01/23/2020 Discharge date: 01/28/2020  Admitted From: Home Disposition:  Home   Recommendations for Outpatient Follow-up:  1. Follow up with PCP in 1-2 weeks 2. Please obtain BMP/CBC in one week   Home Health: YES Equipment/Devices: 2L [place]  Discharge Condition: Stable CODE STATUS: FULL Diet recommendation: Heart Healthy   Brief/Interim Summary: 69 y/o femalewith a history of systolic and diastolic CHF,NASHliver cirrhosis, CKD stage III, hypertension, hyperlipidemia, pancytopenia, paroxysmal atrial fibrillation presenting with4 dayhistory of substernal and epigastric abdominal pain. The patient states that this has been occurring for about4 daysand it is like a tearing sharp sensation. She denies any frank dysphagia or odynophagia, but did have some episodes of nausea and vomiting.The patient states that she began having nausea and vomiting without blood on 01/19/2020. She subsequently developed some chest discomfort that was substernal and sharp in nature.She took 2 SL NTG without relief. Thenshe took some Tums with some relief of her chest discomfort. However, she continued to have nausea and vomiting to the point where she had dry heaves for the next 2 to 3 days. She states that intermittently she would be able to keep down some liquids but continued to have upper abdominal discomfort and intermittent dry heaves. As result, the patient presented for further evaluation. She has some subjective fevers and chills. She denies any headache, visual disturbance, hematemesis, dysuria, hematuria. On 01/22/2020, the patient stated that she was feeling constipated and straining to have a bowel movement. As result, she has been having intermittent hematochezia since that period of time. She denies any NSAIDs.Notably, the patient has similar  presentation during the hospitalization from 05/19/2019 to 05/24/2019 during that hospitalization, the patient had a CTA chest and CT abdomen pelvis which were negative for PE andNo evidence for significant stenosis involving the mesenteric vasculature.There are no hospitalization, the patient also had a new oxygen requirement and discharged home with 2 L nasal cannula. However, she stated she has no longer wearing oxygen at home. In the emergency department, the patient was afebrile hemodynamically stable. Oxygen saturation was 87% on room air. She was placed on 2 L with oxygen saturation 93-95%. Sodium 130, potassium 3.5, chloride 89, CO2 28, serum creatinine 1.5. AST 86, ALT 24, alk phosphatase 94, total bilirubin 6.7. BNP 175. WBC 7.4, hemoglobin 14.5, platelets 68. Troponin 40>>39. EKG showed sinus rhythm with PACs and left bundle branch block.  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 -GI consulted-->plan diagnostic paracentesis 11/29 -treat conservatively -ContinuePPI -Clear liquid diet>>advanced per GI -11/26/21CT abd/pelvis--mild thickening ascending colon-nonspecific in cirrhosis with ascites; new upper abd ascites + splenomegaly -01/27/20--tolerating diet  Acute respiratory failure with hypoxia -Patient was discharged home on 2 L nasal cannula during her March 2021 admission -Patient says that she is no longer wearing oxygen -Oxygen saturation down to 87% on room air -Stable on 2 L>>RA -Secondary to COPD -suspect pt has been chronically hypoxic at home in setting of COPD with ~40 pack year hx tobacco -Personally reviewed chest x-ray--chronic bronchitic  markings and scarring -ambulatory pulse ox on day of d/c showed desaturation <88% -d/c home with 2L   Chronic systolic and diastolic CHF -Holdingtorsemide temporarily--restart at  half of prior dose -05/20/19 Echo--EF50-55%, no Wma -01/24/20 Echo--EF 35-40%, HK inf wall and inferolateral wall; mild-mod MR -restartedmetoprolol succinate (had been off about 1 year) -cardiology consult-->restart metoprolol and spiro and follow up with Dr. Burt Knack -BP soft precludes added ARB  NASH Liver Cirrhosiswith ascites -holdingtorsemide temporarily due to volume depletionand soft BPs -paracentesis 11/29--not enough ascites to perform  Atypical chest pain -Troponins40>>39 -suspect musculoskeletal pain/GERD -Personally reviewed EKG--sinus rhythm, nonspecific T wave changes -Continue Imdur at lower dose (90>>11m) -GERD  CKD 3a -baseline creatinine 0.9-1.1 -serum creatinine 1.06 on day of d/c  Hypokalemia -Repleted -Check magnesium--2.0  Hyponatremia -Secondary to liver cirrhosis and volume depletion -Holding torsemide and spironolactone temporarily -restart torsemide and spiro at lower dose--half of previous doses due to soft BPs  Hematochezia -suspect anorectal source -03/20/17 flex sig--normal sigmoid and rectum; +ext hemorrhoids -GI consult--continue medical management -Hgb remained stable throughout hospitalization without any further hematochezia  Persistent atrial fibrillation -EKG shows sinus rhythm -not a candidate for AC due to cirrhosis, esophageal varices, severe thrombocytopenia -continue metoprolol succinate  Essential hypertension -Holding valsartan secondary to soft -continue metoprolol succinate at half of prior home dose  Hyperlipidemia -Continue Zetiaand statin when able to tolerate po  Pancytopenia -Secondary to liver cirrhosis  Anxiety/depression -Continue fluoxetinewhen able to tolerate po  Lung Nodule -incidential finding of  LLL--67m-outpatient surveillance    Discharge Instructions   Allergies as of 01/28/2020      Reactions   Entresto [sacubitril-valsartan] Swelling   Swelling of eyes   Acetaminophen Other (See Comments)   Liver problems   Oxycodone Itching, Nausea Only   Ace Inhibitors Other (See Comments)   UNSURE OF REACTION TYPE   Cefaclor Itching, Rash   Cephalexin Itching, Rash   Penicillins Rash   Has patient had a PCN reaction causing immediate rash, facial/tongue/throat swelling, SOB or lightheadedness with hypotension: YES Has patient had a PCN reaction causing severe rash involving mucus membranes or skin necrosis: NO Has patient had a PCN reaction that required hospitalization: YES Has patient had a PCN reaction occurring within the last 10 years: NO If all of the above answers are "NO", then may proceed with Cephalosporin use.   Pregabalin Other (See Comments)   Retains fluid   Tape Itching, Rash   Takes skin right off with medical tape--PAPER TAPE ONLY      Medication List    STOP taking these medications   valsartan 160 MG tablet Commonly known as: DIOVAN     TAKE these medications   albuterol 108 (90 Base) MCG/ACT inhaler Commonly known as: VENTOLIN HFA Inhale 2 puffs into the lungs every 6 (six) hours as needed for wheezing or shortness of breath.   atorvastatin 20 MG tablet Commonly known as: LIPITOR Take 20 mg by mouth daily.   ezetimibe 10 MG tablet Commonly known as: ZETIA Take 10 mg by mouth at bedtime.   FLUoxetine 20 MG capsule Commonly known as: PROZAC Take 20 mg by mouth daily.   ipratropium 0.03 % nasal spray Commonly known as: ATROVENT Place 1 spray into both nostrils daily.   isosorbide mononitrate 60 MG 24 hr tablet Commonly known as: IMDUR Take 1 tablet (60 mg total) by mouth daily. Start taking on: January 29, 2020 What changed: how much to take   metoprolol succinate 25 MG 24 hr tablet Commonly known as: TOPROL-XL Take 0.5 tablets  (12.5 mg total) by mouth daily. Start taking on: January 29, 2020 What changed: how much to take   Nitrostat  0.4 MG SL tablet Generic drug: nitroGLYCERIN Place 0.4 mg under the tongue every 5 (five) minutes as needed for chest pain (x 3 tabs daily).   Oxycodone HCl 10 MG Tabs Take 10 mg by mouth 4 (four) times daily. Patient states that she takes as needed for Pain.   pantoprazole 40 MG tablet Commonly known as: PROTONIX Take 40 mg by mouth daily.   polyethylene glycol 17 g packet Commonly known as: MIRALAX / GLYCOLAX Take 17 g by mouth daily.   spironolactone 25 MG tablet Commonly known as: ALDACTONE Take 0.5 tablets (12.5 mg total) by mouth daily. Start taking on: January 29, 2020 What changed: how much to take   torsemide 20 MG tablet Commonly known as: DEMADEX Take 1 tablet (20 mg total) by mouth daily. What changed: how much to take            Durable Medical Equipment  (From admission, onward)         Start     Ordered   01/28/20 1047  For home use only DME oxygen  Once       Question Answer Comment  Length of Need 6 Months   Mode or (Route) Nasal cannula   Liters per Minute 2   Frequency Continuous (stationary and portable oxygen unit needed)   Oxygen conserving device Yes   Oxygen delivery system Gas      01/28/20 1047          Allergies  Allergen Reactions  . Entresto [Sacubitril-Valsartan] Swelling    Swelling of eyes  . Acetaminophen Other (See Comments)    Liver problems  . Oxycodone Itching and Nausea Only  . Ace Inhibitors Other (See Comments)    UNSURE OF REACTION TYPE   . Cefaclor Itching and Rash  . Cephalexin Itching and Rash  . Penicillins Rash    Has patient had a PCN reaction causing immediate rash, facial/tongue/throat swelling, SOB or lightheadedness with hypotension: YES Has patient had a PCN reaction causing severe rash involving mucus membranes or skin necrosis: NO Has patient had a PCN reaction that required  hospitalization: YES Has patient had a PCN reaction occurring within the last 10 years: NO If all of the above answers are "NO", then may proceed with Cephalosporin use.   . Pregabalin Other (See Comments)    Retains fluid  . Tape Itching and Rash    Takes skin right off with medical tape--PAPER TAPE ONLY    Consultations:  GI   Procedures/Studies: CT ABDOMEN PELVIS W CONTRAST  Result Date: 01/24/2020 CLINICAL DATA:  Abdominal pain and fever. EXAM: CT ABDOMEN AND PELVIS WITH CONTRAST TECHNIQUE: Multidetector CT imaging of the abdomen and pelvis was performed using the standard protocol following bolus administration of intravenous contrast. CONTRAST:  66m OMNIPAQUE IOHEXOL 300 MG/ML  SOLN COMPARISON:  CT angiography of the abdomen pelvis 05/23/2019 FINDINGS: Lower chest: Subsegmental atelectasis versus scar noted within the right middle lobe and both posterior lower lobes.6 mm ground-glass nodule is identified within the left lower lobe. This is new when compared with 05/23/2019. Hepatobiliary: Advanced changes of cirrhosis. The liver has a shrunken and nodular appearance. No focal liver abnormality identified. Gallbladder is unremarkable. Tiny stones within the dependent portion of the gallbladder suspected. No signs of biliary ductal dilatation or acute cholecystitis. Pancreas: Unremarkable. No pancreatic ductal dilatation or surrounding inflammatory changes. Spleen: Enlarged measuring 17.5 by 11.3 x 6.5 cm (volume = 670 cm^3) Adrenals/Urinary Tract: Normal appearance of the adrenal glands. No kidney  mass or hydronephrosis identified. Urinary bladder is unremarkable. Stomach/Bowel: Stomach appears nondistended. The appendix is visualized and appears normal. No small bowel wall thickening, inflammation or distension. There is mild wall thickening involving the ascending colon, nonspecific in the setting of cirrhosis, ascites and portal venous hypertension. Sigmoid diverticulosis noted without  acute inflammation. Vascular/Lymphatic: Aortic atherosclerosis. No aneurysm. Distal esophageal varices noted. There also large gastric and umbilical varices. No abdominal no pelvic adenopathy. Varices. Reproductive: Status post hysterectomy. No adnexal masses. Other: New upper abdominal ascites overlying the spleen and liver. No discrete fluid collections identified. Musculoskeletal: Previous right hip arthroplasty. Status post vertebral augmentation at L1. Interbody spacers are noted at L4-5 and L5-S1. IMPRESSION: 1. No acute findings identified within the abdomen or pelvis. 2. Advanced changes of cirrhosis and portal venous hypertension. New upper abdominal ascites. 3. New 6 mm ground-glass nodule identified within the left lower lobe. Likely postinflammatory Initial follow-up with CT at 6-12 months is recommended to confirm persistence. If persistent, repeat CT is recommended every 2 years until 5 years of stability has been established. This recommendation follows the consensus statement: Guidelines for Management of Incidental Pulmonary Nodules Detected on CT Images: From the Fleischner Society 2017; Radiology 2017; 284:228-243. 4. Aortic atherosclerosis. Aortic Atherosclerosis (ICD10-I70.0). Electronically Signed   By: Kerby Moors M.D.   On: 01/24/2020 14:14   US Venous Img Lower Bilateral (DVT)  Result Date: 01/24/2020 CLINICAL DATA:  Left leg swelling. EXAM: BILATERAL LOWER EXTREMITY VENOUS DOPPLER ULTRASOUND TECHNIQUE: Gray-scale sonography with graded compression, as well as color Doppler and duplex ultrasound were performed to evaluate the lower extremity deep venous systems from the level of the common femoral vein and including the common femoral, femoral, profunda femoral, popliteal and calf veins including the posterior tibial, peroneal and gastrocnemius veins when visible. The superficial great saphenous vein was also interrogated. Spectral Doppler was utilized to evaluate flow at rest and  with distal augmentation maneuvers in the common femoral, femoral and popliteal veins. COMPARISON:  None. FINDINGS: RIGHT LOWER EXTREMITY Common Femoral Vein: No evidence of thrombus. Normal compressibility, respiratory phasicity and response to augmentation. Saphenofemoral Junction: No evidence of thrombus. Normal compressibility and flow on color Doppler imaging. Profunda Femoral Vein: No evidence of thrombus. Normal compressibility and flow on color Doppler imaging. Femoral Vein: No evidence of thrombus. Normal compressibility, respiratory phasicity and response to augmentation. Popliteal Vein: No evidence of thrombus. Normal compressibility, respiratory phasicity and response to augmentation. Calf Veins: Visualized right deep calf veins are patent without thrombus. Other Findings:  None. LEFT LOWER EXTREMITY Common Femoral Vein: No evidence of thrombus. Normal compressibility, respiratory phasicity and response to augmentation. Saphenofemoral Junction: No evidence of thrombus. Normal compressibility and flow on color Doppler imaging. Profunda Femoral Vein: No evidence of thrombus. Normal compressibility and flow on color Doppler imaging. Femoral Vein: No evidence of thrombus. Normal compressibility, respiratory phasicity and response to augmentation. Popliteal Vein: No evidence of thrombus. Normal compressibility, respiratory phasicity and response to augmentation. Calf Veins: Visualized left deep calf veins are patent without thrombus. Other Findings:  None. IMPRESSION: No evidence of deep venous thrombosis in either lower extremity. Electronically Signed   By: Markus Daft M.D.   On: 01/24/2020 10:54   DG Chest Portable 1 View  Result Date: 01/23/2020 CLINICAL DATA:  Chest pain EXAM: PORTABLE CHEST 1 VIEW COMPARISON:  05/23/2019, 04/17/2018, 08/14/2016 CT 05/17/2019 FINDINGS: Post sternotomy changes. Cardiomegaly with aortic atherosclerosis. Chronic interstitial thickening. No consolidation or effusion. No  pneumothorax. IMPRESSION: Cardiomegaly without overt pulmonary edema,  pleural effusion or focal airspace disease. Electronically Signed   By: Donavan Foil M.D.   On: 01/23/2020 19:38   ECHOCARDIOGRAM COMPLETE  Result Date: 01/24/2020    ECHOCARDIOGRAM REPORT   Patient Name:   JELITZA MANNINEN Date of Exam: 01/24/2020 Medical Rec #:  [id number]      Height:       62.0 in Accession #:    [id number]     Weight:       172.6 lb Date of Birth:  1950/10/18      BSA:          1.796 m Patient Age:    1 years       BP:           107/59 mmHg Patient Gender: F              HR:           94 bpm. Exam Location:  [facility] Procedure: 2D Echo, Cardiac Doppler and Color Doppler Indications:    Chest Pain 786.50 / R07.9  History:        Patient has prior history of Echocardiogram examinations. CHF,                 CAD, Prior CABG, Arrythmias:Atrial Fibrillation; Risk                 Factors:Hypertension and Dyslipidemia. GERD, Obesity.  Sonographer:    Alvino Chapel RCS Referring Phys: [phone] Lissandro Dilorenzo IMPRESSIONS  1. Left ventricular ejection fraction, by estimation, is 35 to 40%. The left ventricle has moderately decreased function. The left ventricle demonstrates global hypokinesis. Left ventricular diastolic function could not be evaluated. There is severe hypokinesis of the left ventricular, entire inferior wall and inferolateral wall.  2. Right ventricular systolic function is mildly reduced. The right ventricular size is normal. There is mildly elevated pulmonary artery systolic pressure.  3. Left atrial size was severely dilated.  4. Right atrial size was severely dilated.  5. The mitral valve is normal in structure. Mild to moderate mitral valve regurgitation.  6. Tricuspid valve regurgitation is moderate.  7. The aortic valve is tricuspid. Aortic valve regurgitation is not visualized. Mild aortic valve sclerosis is present, with no evidence of aortic valve stenosis.  8. The inferior vena cava is normal in size with <50%  respiratory variability, suggesting right atrial pressure of 8 mmHg. Comparison(s): A prior study was performed on 05/20/2019. The left ventricular function is worsened. The left ventricular wall motion abnormalities are worse. FINDINGS  Left Ventricle: Left ventricular ejection fraction, by estimation, is 35 to 40%. The left ventricle has moderately decreased function. The left ventricle demonstrates global hypokinesis. Severe hypokinesis of the left ventricular, entire inferior wall and inferolateral wall. The left ventricular internal cavity size was normal in size. There is no left ventricular hypertrophy. Abnormal (paradoxical) septal motion consistent with post-operative status. Left ventricular diastolic function could not be evaluated due to atrial fibrillation. Left ventricular diastolic function could not be evaluated. Right Ventricle: The right ventricular size is normal. No increase in right ventricular wall thickness. Right ventricular systolic function is mildly reduced. There is mildly elevated pulmonary artery systolic pressure. The tricuspid regurgitant velocity  is 2.75 m/s, and with an assumed right atrial pressure of 8 mmHg, the estimated right ventricular systolic pressure is 66.0 mmHg. Left Atrium: Left atrial size was severely dilated. Right Atrium: Right atrial size was severely dilated. Pericardium: There is no evidence of pericardial effusion. Mitral  Valve: The mitral valve is normal in structure. Mild mitral annular calcification. Mild to moderate mitral valve regurgitation. Tricuspid Valve: The tricuspid valve is normal in structure. Tricuspid valve regurgitation is moderate. Aortic Valve: The aortic valve is tricuspid. Aortic valve regurgitation is not visualized. Mild aortic valve sclerosis is present, with no evidence of aortic valve stenosis. Pulmonic Valve: The pulmonic valve was normal in structure. Pulmonic valve regurgitation is not visualized. Aorta: The aortic root and ascending  aorta are structurally normal, with no evidence of dilitation. Venous: The inferior vena cava is normal in size with less than 50% respiratory variability, suggesting right atrial pressure of 8 mmHg. IAS/Shunts: No atrial level shunt detected by color flow Doppler.  LEFT VENTRICLE PLAX 2D LVIDd:         5.00 cm  Diastology LV PW:         1.10 cm  LV e' medial:    9.57 cm/s LV IVS:        1.00 cm  LV E/e' medial:  16.1 LVOT diam:     1.80 cm  LV e' lateral:   14.30 cm/s LV SV:         56       LV E/e' lateral: 10.8 LV SV Index:   31 LVOT Area:     2.54 cm  RIGHT VENTRICLE RV S prime:     9.25 cm/s TAPSE (M-mode): 2.0 cm LEFT ATRIUM             Index       RIGHT ATRIUM           Index LA Vol (A2C):   86.2 ml 48.00 ml/m RA Area:     23.80 cm LA Vol (A4C):   81.9 ml 45.61 ml/m RA Volume:   75.40 ml  41.99 ml/m LA Biplane Vol: 85.3 ml 47.50 ml/m  AORTIC VALVE LVOT Vmax:   99.20 cm/s LVOT Vmean:  64.300 cm/s LVOT VTI:    0.221 m  AORTA Ao Root diam: 4.90 cm MITRAL VALVE                TRICUSPID VALVE MV Area (PHT): 3.97 cm     TR Peak grad:   30.2 mmHg MV Decel Time: 191 msec     TR Vmax:        275.00 cm/s MV E velocity: 154.00 cm/s                             SHUNTS                             Systemic VTI:  0.22 m                             Systemic Diam: 1.80 cm Dani Gobble Croitoru MD Electronically signed by Sanda Klein MD Signature Date/Time: 01/24/2020/4:45:09 PM    Final    US LIVER DOPPLER  Result Date: 01/27/2020 CLINICAL DATA:  Cirrhosis. EXAM: DUPLEX ULTRASOUND OF LIVER TECHNIQUE: Color and duplex Doppler ultrasound was performed to evaluate the hepatic in-flow and out-flow vessels. COMPARISON:  CT abdomen 01/24/2020 FINDINGS: Liver: Liver is slightly heterogeneous with a nodular contour. Findings are compatible with cirrhosis. No discrete liver lesion. Small amount of perihepatic ascites. Main Portal Vein size: 1.5 cm Portal Vein Velocities Main Prox:  32 cm/sec Main Mid: 26  cm/sec Main Dist:  25  cm/sec Right: 20 cm/sec Left: 80 cm/sec Hepatic Vein Velocities Right:  49 cm/sec Middle:  70 cm/sec Left:  69 cm/sec IVC: Present and patent with normal respiratory phasicity. Hepatic Artery Velocity:  109 cm/sec Splenic Vein Velocity:  24 cm/sec Spleen: 15.8 cm x 15.7 cm x 7.6 cm with a total volume of 991 cm^3 (411 cm^3 is upper limit normal) Portal Vein Occlusion/Thrombus: No Splenic Vein Occlusion/Thrombus: No Ascites: Present Varices: Present Normal hepatofugal flow in the hepatic veins. Normal hepatopetal flow in the portal veins. Enlarged varices at the umbilicus and this corresponds with the previous CT findings. IMPRESSION: 1. 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 2. Portal venous system is patent with normal direction of flow. Electronically Signed   By: Markus Daft M.D.   On: 01/27/2020 13:52        Discharge Exam: Vitals:   01/27/20 2118 01/28/20 0522  BP:  (!) 100/48  Pulse:  70  Resp:  16  Temp:  98.3 F (36.8 C)  SpO2: (!) 86% 95%   Vitals:   01/27/20 1459 01/27/20 2053 01/27/20 2118 01/28/20 0522  BP: (!) 106/45 (!) 101/45  (!) 100/48  Pulse: 71 81  70  Resp: 18 16  16   Temp: 98.2 F (36.8 C) 98.4 F (36.9 C)  98.3 F (36.8 C)  TempSrc: Oral Oral  Oral  SpO2: 99% 98% (!) 86% 95%  Weight:      Height:        General: Pt is alert, awake, not in acute distress Cardiovascular: RRR, S1/S2 +, no rubs, no gallops Respiratory: CTA bilaterally, no wheezing, no rhonchi Abdominal: Soft, NT, ND, bowel sounds + Extremities: no edema, no cyanosis   The results of significant diagnostics from this hospitalization (including imaging, microbiology, ancillary and laboratory) are listed below for reference.    Significant Diagnostic Studies: CT ABDOMEN PELVIS W CONTRAST  Result Date: 01/24/2020 CLINICAL DATA:  Abdominal pain and fever. EXAM: CT ABDOMEN AND PELVIS WITH CONTRAST TECHNIQUE: Multidetector CT imaging of the abdomen and  pelvis was performed using the standard protocol following bolus administration of intravenous contrast. CONTRAST:  80m OMNIPAQUE IOHEXOL 300 MG/ML  SOLN COMPARISON:  CT angiography of the abdomen pelvis 05/23/2019 FINDINGS: Lower chest: Subsegmental atelectasis versus scar noted within the right middle lobe and both posterior lower lobes.6 mm ground-glass nodule is identified within the left lower lobe. This is new when compared with 05/23/2019. Hepatobiliary: Advanced changes of cirrhosis. The liver has a shrunken and nodular appearance. No focal liver abnormality identified. Gallbladder is unremarkable. Tiny stones within the dependent portion of the gallbladder suspected. No signs of biliary ductal dilatation or acute cholecystitis. Pancreas: Unremarkable. No pancreatic ductal dilatation or surrounding inflammatory changes. Spleen: Enlarged measuring 17.5 by 11.3 x 6.5 cm (volume = 670 cm^3) Adrenals/Urinary Tract: Normal appearance of the adrenal glands. No kidney mass or hydronephrosis identified. Urinary bladder is unremarkable. Stomach/Bowel: Stomach appears nondistended. The appendix is visualized and appears normal. No small bowel wall thickening, inflammation or distension. There is mild wall thickening involving the ascending colon, nonspecific in the setting of cirrhosis, ascites and portal venous hypertension. Sigmoid diverticulosis noted without acute inflammation. Vascular/Lymphatic: Aortic atherosclerosis. No aneurysm. Distal esophageal varices noted. There also large gastric and umbilical varices. No abdominal no pelvic adenopathy. Varices. Reproductive: Status post hysterectomy. No adnexal masses. Other: New upper abdominal ascites overlying the spleen and liver. No discrete fluid collections identified. Musculoskeletal: Previous right hip  arthroplasty. Status post vertebral augmentation at L1. Interbody spacers are noted at L4-5 and L5-S1. IMPRESSION: 1. No acute findings identified within the  abdomen or pelvis. 2. Advanced changes of cirrhosis and portal venous hypertension. New upper abdominal ascites. 3. New 6 mm ground-glass nodule identified within the left lower lobe. Likely postinflammatory Initial follow-up with CT at 6-12 months is recommended to confirm persistence. If persistent, repeat CT is recommended every 2 years until 5 years of stability has been established. This recommendation follows the consensus statement: Guidelines for Management of Incidental Pulmonary Nodules Detected on CT Images: From the Fleischner Society 2017; Radiology 2017; 284:228-243. 4. Aortic atherosclerosis. Aortic Atherosclerosis (ICD10-I70.0). Electronically Signed   By: Kerby Moors M.D.   On: 01/24/2020 14:14   US Venous Img Lower Bilateral (DVT)  Result Date: 01/24/2020 CLINICAL DATA:  Left leg swelling. EXAM: BILATERAL LOWER EXTREMITY VENOUS DOPPLER ULTRASOUND TECHNIQUE: Gray-scale sonography with graded compression, as well as color Doppler and duplex ultrasound were performed to evaluate the lower extremity deep venous systems from the level of the common femoral vein and including the common femoral, femoral, profunda femoral, popliteal and calf veins including the posterior tibial, peroneal and gastrocnemius veins when visible. The superficial great saphenous vein was also interrogated. Spectral Doppler was utilized to evaluate flow at rest and with distal augmentation maneuvers in the common femoral, femoral and popliteal veins. COMPARISON:  None. FINDINGS: RIGHT LOWER EXTREMITY Common Femoral Vein: No evidence of thrombus. Normal compressibility, respiratory phasicity and response to augmentation. Saphenofemoral Junction: No evidence of thrombus. Normal compressibility and flow on color Doppler imaging. Profunda Femoral Vein: No evidence of thrombus. Normal compressibility and flow on color Doppler imaging. Femoral Vein: No evidence of thrombus. Normal compressibility, respiratory phasicity and  response to augmentation. Popliteal Vein: No evidence of thrombus. Normal compressibility, respiratory phasicity and response to augmentation. Calf Veins: Visualized right deep calf veins are patent without thrombus. Other Findings:  None. LEFT LOWER EXTREMITY Common Femoral Vein: No evidence of thrombus. Normal compressibility, respiratory phasicity and response to augmentation. Saphenofemoral Junction: No evidence of thrombus. Normal compressibility and flow on color Doppler imaging. Profunda Femoral Vein: No evidence of thrombus. Normal compressibility and flow on color Doppler imaging. Femoral Vein: No evidence of thrombus. Normal compressibility, respiratory phasicity and response to augmentation. Popliteal Vein: No evidence of thrombus. Normal compressibility, respiratory phasicity and response to augmentation. Calf Veins: Visualized left deep calf veins are patent without thrombus. Other Findings:  None. IMPRESSION: No evidence of deep venous thrombosis in either lower extremity. Electronically Signed   By: Markus Daft M.D.   On: 01/24/2020 10:54   DG Chest Portable 1 View  Result Date: 01/23/2020 CLINICAL DATA:  Chest pain EXAM: PORTABLE CHEST 1 VIEW COMPARISON:  05/23/2019, 04/17/2018, 08/14/2016 CT 05/17/2019 FINDINGS: Post sternotomy changes. Cardiomegaly with aortic atherosclerosis. Chronic interstitial thickening. No consolidation or effusion. No pneumothorax. IMPRESSION: Cardiomegaly without overt pulmonary edema, pleural effusion or focal airspace disease. Electronically Signed   By: Donavan Foil M.D.   On: 01/23/2020 19:38   ECHOCARDIOGRAM COMPLETE  Result Date: 01/24/2020    ECHOCARDIOGRAM REPORT   Patient Name:   NADEEN SHIPMAN Date of Exam: 01/24/2020 Medical Rec #:  [id number]      Height:       62.0 in Accession #:    [id number]     Weight:       172.6 lb Date of Birth:  02/27/1951      BSA:  1.796 m Patient Age:    38 years       BP:           107/59 mmHg Patient Gender: F               HR:           94 bpm. Exam Location:  [facility] Procedure: 2D Echo, Cardiac Doppler and Color Doppler Indications:    Chest Pain 786.50 / R07.9  History:        Patient has prior history of Echocardiogram examinations. CHF,                 CAD, Prior CABG, Arrythmias:Atrial Fibrillation; Risk                 Factors:Hypertension and Dyslipidemia. GERD, Obesity.  Sonographer:    Alvino Chapel RCS Referring Phys: [phone] Jaymi Tinner IMPRESSIONS  1. Left ventricular ejection fraction, by estimation, is 35 to 40%. The left ventricle has moderately decreased function. The left ventricle demonstrates global hypokinesis. Left ventricular diastolic function could not be evaluated. There is severe hypokinesis of the left ventricular, entire inferior wall and inferolateral wall.  2. Right ventricular systolic function is mildly reduced. The right ventricular size is normal. There is mildly elevated pulmonary artery systolic pressure.  3. Left atrial size was severely dilated.  4. Right atrial size was severely dilated.  5. The mitral valve is normal in structure. Mild to moderate mitral valve regurgitation.  6. Tricuspid valve regurgitation is moderate.  7. The aortic valve is tricuspid. Aortic valve regurgitation is not visualized. Mild aortic valve sclerosis is present, with no evidence of aortic valve stenosis.  8. The inferior vena cava is normal in size with <50% respiratory variability, suggesting right atrial pressure of 8 mmHg. Comparison(s): A prior study was performed on 05/20/2019. The left ventricular function is worsened. The left ventricular wall motion abnormalities are worse. FINDINGS  Left Ventricle: Left ventricular ejection fraction, by estimation, is 35 to 40%. The left ventricle has moderately decreased function. The left ventricle demonstrates global hypokinesis. Severe hypokinesis of the left ventricular, entire inferior wall and inferolateral wall. The left ventricular internal cavity size was  normal in size. There is no left ventricular hypertrophy. Abnormal (paradoxical) septal motion consistent with post-operative status. Left ventricular diastolic function could not be evaluated due to atrial fibrillation. Left ventricular diastolic function could not be evaluated. Right Ventricle: The right ventricular size is normal. No increase in right ventricular wall thickness. Right ventricular systolic function is mildly reduced. There is mildly elevated pulmonary artery systolic pressure. The tricuspid regurgitant velocity  is 2.75 m/s, and with an assumed right atrial pressure of 8 mmHg, the estimated right ventricular systolic pressure is 96.0 mmHg. Left Atrium: Left atrial size was severely dilated. Right Atrium: Right atrial size was severely dilated. Pericardium: There is no evidence of pericardial effusion. Mitral Valve: The mitral valve is normal in structure. Mild mitral annular calcification. Mild to moderate mitral valve regurgitation. Tricuspid Valve: The tricuspid valve is normal in structure. Tricuspid valve regurgitation is moderate. Aortic Valve: The aortic valve is tricuspid. Aortic valve regurgitation is not visualized. Mild aortic valve sclerosis is present, with no evidence of aortic valve stenosis. Pulmonic Valve: The pulmonic valve was normal in structure. Pulmonic valve regurgitation is not visualized. Aorta: The aortic root and ascending aorta are structurally normal, with no evidence of dilitation. Venous: The inferior vena cava is normal in size with less than 50% respiratory variability,  suggesting right atrial pressure of 8 mmHg. IAS/Shunts: No atrial level shunt detected by color flow Doppler.  LEFT VENTRICLE PLAX 2D LVIDd:         5.00 cm  Diastology LV PW:         1.10 cm  LV e' medial:    9.57 cm/s LV IVS:        1.00 cm  LV E/e' medial:  16.1 LVOT diam:     1.80 cm  LV e' lateral:   14.30 cm/s LV SV:         56       LV E/e' lateral: 10.8 LV SV Index:   31 LVOT Area:     2.54  cm  RIGHT VENTRICLE RV S prime:     9.25 cm/s TAPSE (M-mode): 2.0 cm LEFT ATRIUM             Index       RIGHT ATRIUM           Index LA Vol (A2C):   86.2 ml 48.00 ml/m RA Area:     23.80 cm LA Vol (A4C):   81.9 ml 45.61 ml/m RA Volume:   75.40 ml  41.99 ml/m LA Biplane Vol: 85.3 ml 47.50 ml/m  AORTIC VALVE LVOT Vmax:   99.20 cm/s LVOT Vmean:  64.300 cm/s LVOT VTI:    0.221 m  AORTA Ao Root diam: 4.90 cm MITRAL VALVE                TRICUSPID VALVE MV Area (PHT): 3.97 cm     TR Peak grad:   30.2 mmHg MV Decel Time: 191 msec     TR Vmax:        275.00 cm/s MV E velocity: 154.00 cm/s                             SHUNTS                             Systemic VTI:  0.22 m                             Systemic Diam: 1.80 cm Dani Gobble Croitoru MD Electronically signed by Sanda Klein MD Signature Date/Time: 01/24/2020/4:45:09 PM    Final    US LIVER DOPPLER  Result Date: 01/27/2020 CLINICAL DATA:  Cirrhosis. EXAM: DUPLEX ULTRASOUND OF LIVER TECHNIQUE: Color and duplex Doppler ultrasound was performed to evaluate the hepatic in-flow and out-flow vessels. COMPARISON:  CT abdomen 01/24/2020 FINDINGS: Liver: Liver is slightly heterogeneous with a nodular contour. Findings are compatible with cirrhosis. No discrete liver lesion. Small amount of perihepatic ascites. Main Portal Vein size: 1.5 cm Portal Vein Velocities Main Prox:  32 cm/sec Main Mid: 26 cm/sec Main Dist:  25 cm/sec Right: 20 cm/sec Left: 80 cm/sec Hepatic Vein Velocities Right:  49 cm/sec Middle:  70 cm/sec Left:  69 cm/sec IVC: Present and patent with normal respiratory phasicity. Hepatic Artery Velocity:  109 cm/sec Splenic Vein Velocity:  24 cm/sec Spleen: 15.8 cm x 15.7 cm x 7.6 cm with a total volume of 991 cm^3 (411 cm^3 is upper limit normal) Portal Vein Occlusion/Thrombus: No Splenic Vein Occlusion/Thrombus: No Ascites: Present Varices: Present Normal hepatofugal flow in the hepatic veins. Normal hepatopetal flow in the portal veins. Enlarged  varices at the umbilicus and this  corresponds with the previous CT findings. IMPRESSION: 1. 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 2. Portal venous system is patent with normal direction of flow. Electronically Signed   By: Markus Daft M.D.   On: 01/27/2020 13:52     Microbiology: Recent Results (from the past 240 hour(s))  Resp Panel by RT-PCR (Flu A&B, Covid) Nasopharyngeal Swab     Status: None   Collection Time: 01/23/20  9:41 PM   Specimen: Nasopharyngeal Swab; Nasopharyngeal(NP) swabs in vial transport medium  Result Value Ref Range Status   SARS Coronavirus 2 by RT PCR NEGATIVE NEGATIVE Final    Comment: (NOTE) SARS-CoV-2 target nucleic acids are NOT DETECTED.  The SARS-CoV-2 RNA is generally detectable in upper respiratory specimens during the acute phase of infection. The lowest concentration of SARS-CoV-2 viral copies this assay can detect is 138 copies/mL. A negative result does not preclude SARS-Cov-2 infection and should not be used as the sole basis for treatment or other patient management decisions. A negative result may occur with  improper specimen collection/handling, submission of specimen other than nasopharyngeal swab, presence of viral mutation(s) within the areas targeted by this assay, and inadequate number of viral copies(<138 copies/mL). A negative result must be combined with clinical observations, patient history, and epidemiological information. The expected result is Negative.  Fact Sheet for Patients:  EntrepreneurPulse.com.au  Fact Sheet for Healthcare Providers:  IncredibleEmployment.be  This test is no t yet approved or cleared by the [place] FDA and  has been authorized for detection and/or diagnosis of SARS-CoV-2 by FDA under an Emergency Use Authorization (EUA). This EUA will remain  in effect (meaning this test can be used) for the duration of the COVID-19  declaration under Section 564(b)(1) of the Act, 21 U.S.C.section 360bbb-3(b)(1), unless the authorization is terminated  or revoked sooner.       Influenza A by PCR NEGATIVE NEGATIVE Final   Influenza B by PCR NEGATIVE NEGATIVE Final    Comment: (NOTE) The Xpert Xpress SARS-CoV-2/FLU/RSV plus assay is intended as an aid in the diagnosis of influenza from Nasopharyngeal swab specimens and should not be used as a sole basis for treatment. Nasal washings and aspirates are unacceptable for Xpert Xpress SARS-CoV-2/FLU/RSV testing.  Fact Sheet for Patients: EntrepreneurPulse.com.au  Fact Sheet for Healthcare Providers: IncredibleEmployment.be  This test is not yet approved or cleared by the [place] FDA and has been authorized for detection and/or diagnosis of SARS-CoV-2 by FDA under an Emergency Use Authorization (EUA). This EUA will remain in effect (meaning this test can be used) for the duration of the COVID-19 declaration under Section 564(b)(1) of the Act, 21 U.S.C. section 360bbb-3(b)(1), unless the authorization is terminated or revoked.  Performed at [facility], [address] [place], [place] [address]      Labs: Basic Metabolic Panel: Recent Labs  Lab 01/24/20 0401 01/24/20 0401 01/25/20 0809 01/25/20 0822 01/25/20 0822 01/26/20 1048 01/26/20 1048 01/27/20 0815 01/28/20 0527  NA 134*  --   --  137  --  136  --  136 136  K 2.6*   < >  --  4.2   < > 3.2*   < > 2.9* 3.4*  CL 93*  --   --  101  --  100  --  100 100  CO2 32  --   --  27  --  28  --  27 28  GLUCOSE 118*  --   --  86  --  149*  --  89 92  BUN 11  --   --  9  --  11  --  11 13  CREATININE 1.12*  --   --  1.00  --  1.05*  --  1.08* 1.06*  CALCIUM 8.1*  --   --  8.2*  --  8.5*  --  8.4* 8.5*  MG 1.9  --  2.0  --   --   --   --   --   --   PHOS 3.8  --   --   --   --   --   --   --   --    < > = values in this interval not displayed.   Liver Function  Tests: Recent Labs  Lab 01/24/20 0401 01/25/20 0822 01/26/20 1048 01/27/20 0815 01/28/20 0527  AST 54* 62* 62* 49* 46*  ALT 19 21 23 19 20   ALKPHOS 64 57 67 64 68  BILITOT 5.2* 5.1* 4.7* 4.0* 3.9*  PROT 5.7* 5.6* 6.0* 5.6* 5.5*  ALBUMIN 2.4* 2.3* 2.5* 2.2* 2.3*   Recent Labs  Lab 01/23/20 1821  LIPASE 50   Recent Labs  Lab 01/23/20 1821  AMMONIA 43*   CBC: Recent Labs  Lab 01/24/20 1058 01/25/20 1422 01/26/20 1048 01/27/20 0815 01/28/20 0527  WBC 4.1 3.3* 3.4* 3.1* 3.4*  HGB 13.0 11.9* 12.7 11.8* 11.6*  HCT 39.8 36.9 39.3 36.5 35.2*  MCV 98.8 99.2 99.5 98.6 98.6  PLT 41* 38* 43* 38* 38*   Cardiac Enzymes: No results for input(s): CKTOTAL, CKMB, CKMBINDEX, TROPONINI in the last 168 hours. BNP: Invalid input(s): POCBNP CBG: Recent Labs  Lab 01/25/20 1630  GLUCAP 169*    Time coordinating discharge:  36 minutes  Signed:  Orson Eva, DO [facility] Pager: [phone] 01/28/2020, 10:57 AM

## 2020-01-28 NOTE — TOC Initial Note (Signed)
Transition of Care [facility]) - Initial/Assessment Note    Patient Details  Name: Samantha [name] MRN: [id number] Date of Birth: [birth year]  [facility] CM/SW Contact:    Ihor Gully, LCSW Phone Number: 01/28/2020, 12:16 PM  Clinical Narrative:                 Patient from home. Admitted for intractable nausea and vomiting, acute respiratory failure with hypoxia. Needs home oxygen. DME companies discussed. Referral made.   Expected Discharge Plan: Home/Self Care Barriers to Discharge: No Barriers Identified   Patient Goals and CMS Choice        Expected Discharge Plan and Services Expected Discharge Plan: Home/Self Care         Expected Discharge Date: 01/28/20               DME Arranged: Oxygen DME Agency: [employer] Date DME Agency Contacted: 01/28/20 Time DME Agency Contacted: 1215 Representative spoke with at DME Agency: Barbaraann Rondo            Prior Living Arrangements/Services     Patient language and need for interpreter reviewed:: Yes Do you feel safe going back to the place where you live?: Yes      Need for Family Participation in Patient Care: Yes (Comment) Care giver support system in place?: Yes (comment)   Criminal Activity/Legal Involvement Pertinent to Current Situation/Hospitalization: No - Comment as needed  Activities of Daily Living Home Assistive Devices/Equipment: None ADL Screening (condition at time of admission) Patient's cognitive ability adequate to safely complete daily activities?: Yes Is the patient deaf or have difficulty hearing?: No Does the patient have difficulty seeing, even when wearing glasses/contacts?: No Does the patient have difficulty concentrating, remembering, or making decisions?: No Patient able to express need for assistance with ADLs?: Yes Does the patient have difficulty dressing or bathing?: No Independently performs ADLs?: Yes (appropriate for developmental age) Does the patient have difficulty  walking or climbing stairs?: No Weakness of Legs: None Weakness of Arms/Hands: None  Permission Sought/Granted                  Emotional Assessment Appearance:: Appears stated age   Affect (typically observed): Appropriate Orientation: : Oriented to Self, Oriented to Place, Oriented to  Time, Oriented to Situation Alcohol / Substance Use: Not Applicable Psych Involvement: No (comment)  Admission diagnosis:  Intractable nausea and vomiting [R11.2] Left leg swelling [M79.89] Gastrointestinal hemorrhage, unspecified gastrointestinal hemorrhage type [K92.2] Patient Active Problem List   Diagnosis Date Noted  . Thrombocytopenia ([place]) 01/24/2020  . Left leg swelling 01/24/2020  . Hyperammonemia ([place]) 01/24/2020  . Prolonged QT interval 01/24/2020  . Elevated troponin 01/24/2020  . Intractable nausea and vomiting 01/23/2020  . Atypical chest pain 08/06/2019  . GERD (gastroesophageal reflux disease) 08/06/2019  . Acute respiratory failure with hypoxia ([place]) 05/23/2019  . Abdominal pain 05/20/2019  . Intractable abdominal pain 05/19/2019  . Paroxysmal atrial fibrillation ([place]) 05/19/2019  . Anxiety with depression 05/19/2019  . Sinus arrhythmia 05/19/2019  . Iron deficiency anemia 02/05/2019  . Chronic combined systolic and diastolic CHF (congestive heart failure) ([place]) 04/17/2018  . Heart failure ([place]) 04/17/2018  . Osteoarthritis of spine with radiculopathy, cervical region 01/12/2018  . Paresthesia of both hands 12/14/2017  . Recurrent falls 12/14/2017  . Closed compression fracture of first lumbar vertebra ([place]) 08/23/2017  . Lumbar spondylosis with myelopathy 08/23/2017  . Carpal tunnel syndrome of right wrist 04/05/2017  . Rectal pain 03/06/2017  . Influenza  A (H1N1) 02/22/2017  . Rectal bleeding 02/22/2017  . Closed fracture of lower end of right radius with routine healing 02/20/2017  . Injury of triangular fibrocartilage complex of right wrist 02/20/2017  . Pain  02/10/2017  . Other cirrhosis of liver ([place]) 10/26/2016  . Persistent atrial fibrillation 08/16/2016  . Cardiomyopathy, ischemic-35-40% by cath  01/11/2015  . Obesity 09/24/2013  . Unspecified hereditary and idiopathic peripheral neuropathy 01/10/2013  . Hereditary and idiopathic peripheral neuropathy 01/10/2013  . Cirrhosis, non-alcoholic ([place]) 29/52/8413  . Esophageal varices- Plavix stopped 12/23/2011  . GI bleed 12/23/2011  . Idiopathic esophageal varices without bleeding ([place]) 12/23/2011  . Anemia 04/22/2011  . CAD (coronary artery disease) 08/21/2009  . Mixed hyperlipidemia 06/04/2008  . Essential hypertension 06/04/2008  . S/P CABG x 3- 2001 06/04/2008   PCP:  [name], MD Pharmacy:   [id number] [place] [place]Phone: [phone]: [phone]    Social Determinants of Health (SDOH) Interventions    Readmission Risk Interventions No flowsheet data found.

## 2020-01-28 NOTE — Progress Notes (Signed)
SATURATION QUALIFICATIONS: (This note is used to comply with regulatory documentation for home oxygen)  Patient Saturations on Room Air at Rest = 92  Patient Saturations on Room Air while Ambulating = 86  Patient Saturations on 2 Liters of oxygen while Ambulating = 96  Please briefly explain why patient needs home oxygen: To maintain 02 sat at 90% or above during ambulation.   Orson Eva, DO

## 2020-01-28 NOTE — Plan of Care (Signed)

## 2020-01-31 ENCOUNTER — Telehealth: Payer: Self-pay | Admitting: Cardiovascular Disease

## 2020-01-31 NOTE — Telephone Encounter (Signed)
I spoke with patient. She was recently hospitalized for nausea and vomiting.  Was discharged on 01/28/20.  Nausea and vomiting are better.  During hospitalization aldactone and torsemide were held and then resumed at half previous dose.  Hospital notes indicate the following weights  Filed Weights   01/23/20 1815 01/24/20 1109  Weight: 72.6 kg 78.3 kg   Patient reports when she was discharged she weighed 160 lbs.  Weight on 12/1 was 160 lbs in the AM.  She did not weigh the AM of 12/2 Last night her weight was 177 lbs.  Her legs and feet were tight and swollen.  She took extra 12.5 mg of aldactone and torsemide 20 mg last evening.  This AM weight is 172 lbs. Swelling is still present but has improved some.  She is not having any shortness of breath.  Was discharged on home oxygen and is using this.  Is staying with family so does not have her BP cuff with her. She has taken morning dose of torsemide and aldactone. Will forward to Dr Burt Knack for review/recommendations.

## 2020-01-31 NOTE — Telephone Encounter (Signed)
Left message to call office

## 2020-01-31 NOTE — Telephone Encounter (Signed)
Pt c/o swelling: STAT is pt has developed SOB within 24 hours  1) How much weight have you gained and in what time span? 16 lbs in 2 days  2) If swelling, where is the swelling located? Feet and legs   3) Are you currently taking a fluid pill? Yes   4) Are you currently SOB? No   5) Do you have a log of your daily weights (if so, list)?  01/28/20: 160 lbs       01/30/20: 177 lbs   6) Have you gained 3 pounds in a day or 5 pounds in a week? Patient states she gained 16 lbs in 2 days   7) Have you traveled recently? No

## 2020-01-31 NOTE — Telephone Encounter (Signed)
Recommend going back to pre-hospital doses of diuretics (torsemide 40 mg daily and spironolactone 25 mg daily) starting tomorrow morning. Metabolic panel 2 weeks. thanks

## 2020-01-31 NOTE — Telephone Encounter (Signed)
Patient called back stating that her legs are tight and swollen tonight.  She did not get the message regarding med changes.  She also is having leg cramps.  Dr. Burt Knack recommended that she increase Torsemide to 47m daily but she has already been taking 2 of the 234mdemadex daily starting on Thursday because she had so much fluid in her legs.  Instructed her to take 2533maily of spiro and Torsemide 65m61mD and call if sx do not improve by tomorrow am she was instructed to call back.  She has not been taking her potassium and started that back yesterday.

## 2020-01-31 NOTE — Telephone Encounter (Signed)
Left message on home and cell number to call office

## 2020-02-02 ENCOUNTER — Inpatient Hospital Stay (HOSPITAL_COMMUNITY)
Admission: EM | Admit: 2020-02-02 | Discharge: 2020-02-04 | DRG: 641 | Disposition: A | Discharge status: Home / Self Care | Payer: Medicare Other | Attending: Internal Medicine | Admitting: Internal Medicine

## 2020-02-02 ENCOUNTER — Encounter (HOSPITAL_COMMUNITY): Payer: Self-pay | Admitting: Emergency Medicine

## 2020-02-02 ENCOUNTER — Emergency Department (HOSPITAL_COMMUNITY): Payer: Medicare Other

## 2020-02-02 ENCOUNTER — Other Ambulatory Visit: Payer: Self-pay

## 2020-02-02 DIAGNOSIS — I509 Heart failure, unspecified: Secondary | ICD-10-CM

## 2020-02-02 DIAGNOSIS — I110 Hypertensive heart disease with heart failure: Secondary | ICD-10-CM | POA: Diagnosis not present

## 2020-02-02 DIAGNOSIS — I5042 Chronic combined systolic (congestive) and diastolic (congestive) heart failure: Secondary | ICD-10-CM

## 2020-02-02 DIAGNOSIS — R911 Solitary pulmonary nodule: Secondary | ICD-10-CM | POA: Diagnosis present

## 2020-02-02 DIAGNOSIS — E876 Hypokalemia: Secondary | ICD-10-CM | POA: Diagnosis present

## 2020-02-02 DIAGNOSIS — E871 Hypo-osmolality and hyponatremia: Secondary | ICD-10-CM | POA: Diagnosis not present

## 2020-02-02 DIAGNOSIS — N1831 Chronic kidney disease, stage 3a: Secondary | ICD-10-CM | POA: Diagnosis not present

## 2020-02-02 DIAGNOSIS — K219 Gastro-esophageal reflux disease without esophagitis: Secondary | ICD-10-CM | POA: Diagnosis not present

## 2020-02-02 DIAGNOSIS — Z880 Allergy status to penicillin: Secondary | ICD-10-CM | POA: Diagnosis not present

## 2020-02-02 DIAGNOSIS — I130 Hypertensive heart and chronic kidney disease with heart failure and stage 1 through stage 4 chronic kidney disease, or unspecified chronic kidney disease: Secondary | ICD-10-CM | POA: Diagnosis not present

## 2020-02-02 DIAGNOSIS — I8510 Secondary esophageal varices without bleeding: Secondary | ICD-10-CM | POA: Diagnosis present

## 2020-02-02 DIAGNOSIS — K7581 Nonalcoholic steatohepatitis (NASH): Secondary | ICD-10-CM

## 2020-02-02 DIAGNOSIS — K649 Unspecified hemorrhoids: Secondary | ICD-10-CM | POA: Diagnosis present

## 2020-02-02 DIAGNOSIS — I2510 Atherosclerotic heart disease of native coronary artery without angina pectoris: Secondary | ICD-10-CM | POA: Diagnosis present

## 2020-02-02 DIAGNOSIS — F32A Depression, unspecified: Secondary | ICD-10-CM | POA: Diagnosis present

## 2020-02-02 DIAGNOSIS — R609 Edema, unspecified: Secondary | ICD-10-CM | POA: Diagnosis not present

## 2020-02-02 DIAGNOSIS — R600 Localized edema: Secondary | ICD-10-CM | POA: Diagnosis not present

## 2020-02-02 DIAGNOSIS — E785 Hyperlipidemia, unspecified: Secondary | ICD-10-CM | POA: Diagnosis present

## 2020-02-02 DIAGNOSIS — M549 Dorsalgia, unspecified: Secondary | ICD-10-CM | POA: Diagnosis present

## 2020-02-02 DIAGNOSIS — I4819 Other persistent atrial fibrillation: Secondary | ICD-10-CM | POA: Diagnosis not present

## 2020-02-02 DIAGNOSIS — Z8249 Family history of ischemic heart disease and other diseases of the circulatory system: Secondary | ICD-10-CM | POA: Diagnosis not present

## 2020-02-02 DIAGNOSIS — Z951 Presence of aortocoronary bypass graft: Secondary | ICD-10-CM | POA: Diagnosis not present

## 2020-02-02 DIAGNOSIS — F419 Anxiety disorder, unspecified: Secondary | ICD-10-CM | POA: Diagnosis present

## 2020-02-02 DIAGNOSIS — Z888 Allergy status to other drugs, medicaments and biological substances status: Secondary | ICD-10-CM

## 2020-02-02 DIAGNOSIS — Z87891 Personal history of nicotine dependence: Secondary | ICD-10-CM

## 2020-02-02 DIAGNOSIS — K7460 Unspecified cirrhosis of liver: Secondary | ICD-10-CM | POA: Diagnosis not present

## 2020-02-02 DIAGNOSIS — Z955 Presence of coronary angioplasty implant and graft: Secondary | ICD-10-CM

## 2020-02-02 DIAGNOSIS — Z83511 Family history of glaucoma: Secondary | ICD-10-CM

## 2020-02-02 DIAGNOSIS — Z20822 Contact with and (suspected) exposure to covid-19: Secondary | ICD-10-CM | POA: Diagnosis present

## 2020-02-02 DIAGNOSIS — E8770 Fluid overload, unspecified: Secondary | ICD-10-CM | POA: Diagnosis not present

## 2020-02-02 DIAGNOSIS — Z833 Family history of diabetes mellitus: Secondary | ICD-10-CM

## 2020-02-02 DIAGNOSIS — J90 Pleural effusion, not elsewhere classified: Secondary | ICD-10-CM | POA: Diagnosis not present

## 2020-02-02 DIAGNOSIS — G8929 Other chronic pain: Secondary | ICD-10-CM | POA: Diagnosis present

## 2020-02-02 NOTE — ED Provider Notes (Signed)
[facility] EMERGENCY DEPARTMENT Provider Note   CSN: [id number] Arrival date & time: 02/02/20  1828     History Chief Complaint  Patient presents with  . Leg Swelling    Samantha [name] is a 69 y.o. female.  HPI Patient presents with leg swelling and shortness of breath.  Has a history of cirrhosis and congestive heart failure.  Recent admission the hospital for same.  At to be over diuresed at that time and had some adjustment of her medications.  Has had increase of her diuretics and she is gotten out but has had continued and worsening swelling of her legs.  States she really cannot even get up and walk around because it makes her legs hurt more.  Also has chronic back pain.  States she is feeling more shortness of breath and having more difficulty laying flat to try and sleep.  No real chest pain.    Past Medical History:  Diagnosis Date  . Cataract    OU  . Chronic combined systolic and diastolic CHF (congestive heart failure) ([place])   . Cirrhosis of liver ([place])   . CKD (chronic kidney disease), stage III ([place])   . Coronary artery disease    a. s/p CABG 2001 w/ (LIMA-OM, SVG-D1, SVG-RCA). b. h/o multiple PCIs per Dr. Antionette Char note.  . Depression   . Esophageal varices ([place])    New 2013  . Gastroesophageal reflux disease   . History of pneumonia   . Hyperlipidemia   . Hypertension   . Hypertensive retinopathy    OU  . Obesity   . Osteoarthritis   . Pancytopenia ([place])   . Paroxysmal atrial flutter ([place])    a. dx 05/2016.  [name] Persistent atrial fibrillation ([place])    a. reported in hosp 07/2016, not on anticoag due to cirrhosis and liver disease, low platelets, varices.  . Thrombocytopenia [facility])     Patient Active Problem List   Diagnosis Date Noted  . Thrombocytopenia ([place]) 01/24/2020  . Left leg swelling 01/24/2020  . Hyperammonemia ([place]) 01/24/2020  . Prolonged QT interval 01/24/2020  . Elevated troponin 01/24/2020  . Intractable nausea and vomiting 01/23/2020   . Atypical chest pain 08/06/2019  . GERD (gastroesophageal reflux disease) 08/06/2019  . Acute respiratory failure with hypoxia ([place]) 05/23/2019  . Abdominal pain 05/20/2019  . Intractable abdominal pain 05/19/2019  . Paroxysmal atrial fibrillation ([place]) 05/19/2019  . Anxiety with depression 05/19/2019  . Sinus arrhythmia 05/19/2019  . Iron deficiency anemia 02/05/2019  . Chronic combined systolic and diastolic CHF (congestive heart failure) ([place]) 04/17/2018  . Heart failure ([place]) 04/17/2018  . Osteoarthritis of spine with radiculopathy, cervical region 01/12/2018  . Paresthesia of both hands 12/14/2017  . Recurrent falls 12/14/2017  . Closed compression fracture of first lumbar vertebra ([place]) 08/23/2017  . Lumbar spondylosis with myelopathy 08/23/2017  . Carpal tunnel syndrome of right wrist 04/05/2017  . Rectal pain 03/06/2017  . Influenza A (H1N1) 02/22/2017  . Rectal bleeding 02/22/2017  . Closed fracture of lower end of right radius with routine healing 02/20/2017  . Injury of triangular fibrocartilage complex of right wrist 02/20/2017  . Pain 02/10/2017  . Other cirrhosis of liver ([place]) 10/26/2016  . Persistent atrial fibrillation 08/16/2016  . Cardiomyopathy, ischemic-35-40% by cath  01/11/2015  . Obesity 09/24/2013  . Unspecified hereditary and idiopathic peripheral neuropathy 01/10/2013  . Hereditary and idiopathic peripheral neuropathy 01/10/2013  . Cirrhosis, non-alcoholic ([place]) 09/32/6712  . Esophageal varices- Plavix stopped 12/23/2011  .  GI bleed 12/23/2011  . Idiopathic esophageal varices without bleeding ([place]) 12/23/2011  . Anemia 04/22/2011  . CAD (coronary artery disease) 08/21/2009  . Mixed hyperlipidemia 06/04/2008  . Essential hypertension 06/04/2008  . S/P CABG x 3- 2001 06/04/2008    Past Surgical History:  Procedure Laterality Date  . ABDOMINAL HYSTERECTOMY    . BACK SURGERY    . CARDIAC CATHETERIZATION  2004   left internal mammary artery to the  obtuse marginal  was found to be small and thread like.  The two grafts were patent.  The left circumflex had 90% in-stent restenosis and cutting balloon angioplasty was performed followed by placement of a 3.0 x 51m Taxus drug -eluting stent.    .Marland KitchenCARDIAC CATHETERIZATION  2006   There was in-stent restenosis in the left circumflex and this was treated with cutting balloon angioplasty   . CARDIAC CATHETERIZATION  2008   vein graft to the to the obtuse marginal was patent, although small, left circumflex had 40% in-stent restenosis, ejection fraction 40-45%.  The patient was medically mananged.  .Marland KitchenCARDIAC CATHETERIZATION N/A 01/09/2015   Procedure: Left Heart Cath and Cors/Grafts Angiography;  Surgeon: HBelva Crome MD;  [facility];  Service: Cardiovascular;  Laterality: N/A;  . CATARACT EXTRACTION W/PHACO Right 05/17/2019   Procedure: CATARACT EXTRACTION PHACO AND INTRAOCULAR LENS PLACEMENT (IOC) (CDE: 14.99);  Surgeon: WBaruch Goldmann MD;  Location: [facility];  Service: Ophthalmology;  Laterality: Right;  . CATARACT EXTRACTION W/PHACO Left 06/10/2019   Procedure: CATARACT EXTRACTION PHACO AND INTRAOCULAR LENS PLACEMENT (IOC);  Surgeon: WBaruch Goldmann MD;  Location: [facility];  Service: Ophthalmology;  Laterality: Left;  CDE: 11.96  . cataract sx    . COLONOSCOPY  03/29/2011   Procedure: COLONOSCOPY;  Surgeon: MJamesetta So MD;  Location: [facility];  Service: Gastroenterology;  Laterality: N/A;  . CORONARY ARTERY BYPASS GRAFT  May 31,2001   x 3 with a vein graft to the first diagonal, vein graft to the right coronary  artery, and a free left internal mammary  artery to the obtuse marginal   . ESOPHAGEAL BANDING N/A 07/04/2012   Procedure: ESOPHAGEAL BANDING;  Surgeon: NRogene Houston MD;  Location: [facility];  Service: Endoscopy;  Laterality: N/A;  . ESOPHAGEAL BANDING N/A 09/17/2012   Procedure: ESOPHAGEAL BANDING;  Surgeon: NRogene Houston MD;  Location: [facility];   Service: Endoscopy;  Laterality: N/A;  . ESOPHAGEAL BANDING N/A 10/22/2013   Procedure: ESOPHAGEAL BANDING;  Surgeon: NRogene Houston MD;  Location: [facility];  Service: Endoscopy;  Laterality: N/A;  . ESOPHAGEAL BANDING N/A 11/28/2014   Procedure: ESOPHAGEAL BANDING;  Surgeon: NRogene Houston MD;  [facility]: [facility];  Service: Endoscopy;  Laterality: N/A;  . ESOPHAGEAL BANDING N/A 10/29/2015   Procedure: ESOPHAGEAL BANDING;  Surgeon: NRogene Houston MD;  [facility]: [facility];  Service: Endoscopy;  Laterality: N/A;  . ESOPHAGOGASTRODUODENOSCOPY  12/20/2011   Procedure: ESOPHAGOGASTRODUODENOSCOPY (EGD);  Surgeon: MJamesetta So MD;  [facility]: [facility];  Service: Gastroenterology;  Laterality: N/A;  . ESOPHAGOGASTRODUODENOSCOPY N/A 07/04/2012   Procedure: ESOPHAGOGASTRODUODENOSCOPY (EGD);  Surgeon: NRogene Houston MD;  [facility];  Service: Endoscopy;  Laterality: N/A;  235-moved to 255 Ann to notify pt  . ESOPHAGOGASTRODUODENOSCOPY N/A 09/17/2012   Procedure: ESOPHAGOGASTRODUODENOSCOPY (EGD);  Surgeon: NRogene Houston MD;  Location: [facility];  Service: Endoscopy;  Laterality: N/A;  730  . ESOPHAGOGASTRODUODENOSCOPY N/A 10/22/2013  Procedure: ESOPHAGOGASTRODUODENOSCOPY (EGD);  Surgeon: Rogene Houston, MD;  Location: [facility];  Service: Endoscopy;  Laterality: N/A;  730  . ESOPHAGOGASTRODUODENOSCOPY N/A 11/28/2014   Procedure: ESOPHAGOGASTRODUODENOSCOPY (EGD);  Surgeon: Rogene Houston, MD;  [facility];  Service: Endoscopy;  Laterality: N/A;  1:25  . ESOPHAGOGASTRODUODENOSCOPY N/A 10/29/2015   Procedure: ESOPHAGOGASTRODUODENOSCOPY (EGD);  Surgeon: Rogene Houston, MD;  Location: [facility];  Service: Endoscopy;  Laterality: N/A;  12:00  . ESOPHAGOGASTRODUODENOSCOPY N/A 12/12/2016   Grade 1 and 2 varices in lower third of esophagus, post variceal banding scar distal esophagus, mild portal hypertensive gastropathy, Type 1 gastroesophageal varices  without bleeding, normal duodenum and second portion of duodenum.   [name] FLEXIBLE SIGMOIDOSCOPY N/A 03/20/2017   external hemorrhoids  . JOINT REPLACEMENT    . LEFT HEART CATH AND CORS/GRAFTS ANGIOGRAPHY N/A 08/15/2016   Procedure: Left Heart Cath and Cors/Grafts Angiography;  Surgeon: Jettie Booze, MD;  Location: [place] [facility];  Service: Cardiovascular;  Laterality: N/A;  . LEFT HEART CATHETERIZATION WITH CORONARY/GRAFT ANGIOGRAM N/A 12/25/2013   Procedure: LEFT HEART CATHETERIZATION WITH Beatrix Fetters;  Surgeon: Blane Ohara, MD;  Location[facility] C[facility] LAB;  Service: Cardiovascular;  Laterality: N/A;  . RIGHT HEART CATH N/A 05/29/2017   Procedure: RIGHT HEART CATH;  Surgeon: Jolaine Artist, MD;  Location: [place] [facility];  Service: Cardiovascular;  Laterality: N/A;  . rotator cuff left  2007  . TONSILLECTOMY       OB History   No obstetric history on file.     Family History  Problem Relation Age of Onset  . Heart attack Mother   . Diabetes Mother   . Heart attack Father 34       cause of death  . Diabetes Father   . Coronary artery disease Sister        CABG   . Diabetes Sister   . Glaucoma Sister   . Diabetes Brother   . Colon cancer Neg Hx     Social History   Tobacco Use  . Smoking status: Former Smoker    Packs/day: 0.50    Years: 20.00    Pack years: 10.00    Types: Cigarettes    Quit date: 07/21/1995    Years since quitting: 24.5  . Smokeless tobacco: Never Used  Vaping Use  . Vaping Use: Never used  Substance Use Topics  . Alcohol use: No  . Drug use: No    Home Medications Prior to Admission medications   Medication Sig Start Date End Date Taking? Authorizing Provider  albuterol (PROVENTIL HFA;VENTOLIN HFA) 108 (90 BASE) MCG/ACT inhaler Inhale 2 puffs into the lungs every 6 (six) hours as needed for wheezing or shortness of breath.     [provider]  atorvastatin (LIPITOR) 20 MG tablet Take 20 mg by mouth daily.  01/01/20   [provider]  ezetimibe (ZETIA) 10 MG tablet Take 10 mg by mouth at bedtime.     [provider]  FLUoxetine (PROZAC) 20 MG capsule Take 20 mg by mouth daily.     [provider]  ipratropium (ATROVENT) 0.03 % nasal spray Place 1 spray into both nostrils daily. 12/23/19   [provider]  isosorbide mononitrate (IMDUR) 60 MG 24 hr tablet Take 1 tablet (60 mg total) by mouth daily. 01/29/20   Orson Eva, MD  metoprolol succinate (TOPROL-XL) 25 MG 24 hr tablet Take 0.5 tablets (12.5 mg total) by mouth daily.  01/29/20   Orson Eva, MD  NITROSTAT 0.4 MG SL tablet Place 0.4 mg under the tongue every 5 (five) minutes as needed for chest pain (x 3 tabs daily).  11/15/13   [provider]  Oxycodone HCl 10 MG TABS Take 10 mg by mouth 4 (four) times daily. Patient states that she takes as needed for Pain.    [provider]  pantoprazole (PROTONIX) 40 MG tablet Take 40 mg by mouth daily.    [provider]  polyethylene glycol (MIRALAX / GLYCOLAX) packet Take 17 g by mouth daily.    [provider]  spironolactone (ALDACTONE) 25 MG tablet Take 0.5 tablets (12.5 mg total) by mouth daily. 01/29/20   Orson Eva, MD  torsemide (DEMADEX) 20 MG tablet Take 1 tablet (20 mg total) by mouth daily. 01/28/20 01/22/21  Orson Eva, MD    Allergies    Delene Loll [sacubitril-valsartan], Acetaminophen, Oxycodone, Ace inhibitors, Cefaclor, Cephalexin, Penicillins, Pregabalin, and Tape  Review of Systems   Review of Systems  Constitutional: Negative for appetite change and fever.  HENT: Negative for congestion.   Respiratory: Positive for shortness of breath.   Cardiovascular: Positive for leg swelling.  Gastrointestinal: Positive for abdominal pain.  Genitourinary: Negative for flank pain.  Musculoskeletal: Positive for gait problem.  Skin: Negative for rash.  Neurological: Negative for weakness.  Psychiatric/Behavioral: Negative for  confusion.    Physical Exam Updated Vital Signs BP (!) 109/43   Pulse 81   Temp 98.3 F (36.8 C) (Oral)   Resp 20   Ht 5' 2"  (1.575 m)   Wt 78.3 kg   LMP 03/29/2011   SpO2 97%   BMI 31.57 kg/m   Physical Exam Vitals and nursing note reviewed.  HENT:     Head: Normocephalic.  Eyes:     Extraocular Movements: Extraocular movements intact.     Pupils: Pupils are equal, round, and reactive to light.  Cardiovascular:     Rate and Rhythm: Regular rhythm.  Pulmonary:     Breath sounds: No wheezing, rhonchi or rales.  Abdominal:     Comments: Mild upper abdominal tenderness without rebound or guarding.  Musculoskeletal:     Cervical back: Neck supple.     Right lower leg: Edema present.     Left lower leg: Edema present.     Comments: Severe pitting edema bilateral lower extremities.  Skin:    Capillary Refill: Capillary refill takes less than 2 seconds.  Neurological:     Mental Status: She is alert and oriented to person, place, and time.  Psychiatric:        Mood and Affect: Mood normal.     ED Results / Procedures / Treatments   Labs (all labs ordered are listed, but only abnormal results are displayed) Labs Reviewed  CBC WITH DIFFERENTIAL/PLATELET - Abnormal; Notable for the following components:      Result Value   WBC 3.8 (*)    RBC 3.43 (*)    Hemoglobin 10.9 (*)    HCT 32.4 (*)    RDW 17.4 (*)    Platelets 43 (*)    All other components within normal limits  PROTIME-INR - Abnormal; Notable for the following components:   Prothrombin Time 21.9 (*)    INR 2.0 (*)    All other components within normal limits  COMPREHENSIVE METABOLIC PANEL  BRAIN NATRIURETIC PEPTIDE    EKG EKG Interpretation  Date/Time:  Sunday February 02 2020 23:12:58 EST Ventricular Rate:  88 PR  Interval:    QRS Duration: 174 QT Interval:  467 QTC Calculation: 566 R Axis:   117 Text Interpretation: Sinus or ectopic atrial rhythm Borderline short PR interval Consider left  ventricular hypertrophy Repol abnrm suggests ischemia, anterolateral Prolonged QT interval No significant change since last tracing Confirmed by Davonna Belling [phone]) on 02/02/2020 11:27:46 PM   Radiology DG Chest Portable 1 View  Result Date: 02/02/2020 CLINICAL DATA:  Volume overload, lower extremity edema EXAM: PORTABLE CHEST 1 VIEW COMPARISON:  01/23/2020 FINDINGS: Single frontal view of the chest demonstrates stable enlarged cardiac silhouette. Postsurgical changes are seen from median sternotomy. There is chronic central vascular congestion without airspace disease, effusion, or pneumothorax. IMPRESSION: 1. Central vascular congestion without edema. Electronically Signed   By: Randa Ngo M.D.   On: 02/02/2020 23:09    Procedures Procedures (including critical care time)  Medications Ordered in ED Medications - No data to display  ED Course  I have reviewed the triage vital signs and the nursing notes.  Pertinent labs & imaging results that were available during my care of the patient were reviewed by me and considered in my medical decision making (see chart for details).    MDM Rules/Calculators/A&P                          Patient presents with worsening swelling of her legs.  Can no longer wear shoes due to it.  Also pain and some difficulty breathing.  Cardiology has been increasing her diuretics as an outpatient without relief.  Patient will need admission to the hospital for IV diuretics and fluid management.  Lab work still pending.  Chest x-ray shows vascular congestion.  Care turned over to Dr. Tomi Bamberger. Final Clinical Impression(s) / ED Diagnoses Final diagnoses:  Peripheral edema  NASH (nonalcoholic steatohepatitis)  Congestive heart failure, unspecified HF chronicity, unspecified heart failure type [facility])    Rx / DC Orders ED Discharge Orders    None       Davonna Belling, MD 02/03/20 [phone]

## 2020-02-02 NOTE — ED Triage Notes (Signed)
Pt c/o bilateral leg swelling. Pt states she hurts all over.

## 2020-02-02 NOTE — ED Triage Notes (Signed)
Pt in the bathroom

## 2020-02-02 NOTE — ED Provider Notes (Incomplete)
[facility] EMERGENCY DEPARTMENT Provider Note   CSN: [id number] Arrival date & time: 02/02/20  1828     History Chief Complaint  Patient presents with  . Leg Swelling    Samantha [name] is a 69 y.o. female.  HPI Patient presents with leg swelling and shortness of breath.  Has a history of cirrhosis and congestive heart failure.  Recent admission the hospital for same.  At to be over diuresed at that time and had some adjustment of her medications.  Has had increase of her diuretics and she is gotten out but has had continued and worsening swelling of her legs.  States she really cannot even get up and walk around because it makes her legs hurt more.  Also has chronic back pain.  States she is feeling more shortness of breath and having more difficulty laying flat to try and sleep.  No real chest pain.    Past Medical History:  Diagnosis Date  . Cataract    OU  . Chronic combined systolic and diastolic CHF (congestive heart failure) ([place])   . Cirrhosis of liver ([place])   . CKD (chronic kidney disease), stage III ([place])   . Coronary artery disease    a. s/p CABG 2001 w/ (LIMA-OM, SVG-D1, SVG-RCA). b. h/o multiple PCIs per Dr. Antionette Char note.  . Depression   . Esophageal varices ([place])    New 2013  . Gastroesophageal reflux disease   . History of pneumonia   . Hyperlipidemia   . Hypertension   . Hypertensive retinopathy    OU  . Obesity   . Osteoarthritis   . Pancytopenia ([place])   . Paroxysmal atrial flutter ([place])    a. dx 05/2016.  [name] Persistent atrial fibrillation ([place])    a. reported in hosp 07/2016, not on anticoag due to cirrhosis and liver disease, low platelets, varices.  . Thrombocytopenia [facility])     Patient Active Problem List   Diagnosis Date Noted  . Thrombocytopenia ([place]) 01/24/2020  . Left leg swelling 01/24/2020  . Hyperammonemia ([place]) 01/24/2020  . Prolonged QT interval 01/24/2020  . Elevated troponin 01/24/2020  . Intractable nausea and vomiting 01/23/2020   . Atypical chest pain 08/06/2019  . GERD (gastroesophageal reflux disease) 08/06/2019  . Acute respiratory failure with hypoxia ([place]) 05/23/2019  . Abdominal pain 05/20/2019  . Intractable abdominal pain 05/19/2019  . Paroxysmal atrial fibrillation ([place]) 05/19/2019  . Anxiety with depression 05/19/2019  . Sinus arrhythmia 05/19/2019  . Iron deficiency anemia 02/05/2019  . Chronic combined systolic and diastolic CHF (congestive heart failure) ([place]) 04/17/2018  . Heart failure ([place]) 04/17/2018  . Osteoarthritis of spine with radiculopathy, cervical region 01/12/2018  . Paresthesia of both hands 12/14/2017  . Recurrent falls 12/14/2017  . Closed compression fracture of first lumbar vertebra ([place]) 08/23/2017  . Lumbar spondylosis with myelopathy 08/23/2017  . Carpal tunnel syndrome of right wrist 04/05/2017  . Rectal pain 03/06/2017  . Influenza A (H1N1) 02/22/2017  . Rectal bleeding 02/22/2017  . Closed fracture of lower end of right radius with routine healing 02/20/2017  . Injury of triangular fibrocartilage complex of right wrist 02/20/2017  . Pain 02/10/2017  . Other cirrhosis of liver ([place]) 10/26/2016  . Persistent atrial fibrillation 08/16/2016  . Cardiomyopathy, ischemic-35-40% by cath  01/11/2015  . Obesity 09/24/2013  . Unspecified hereditary and idiopathic peripheral neuropathy 01/10/2013  . Hereditary and idiopathic peripheral neuropathy 01/10/2013  . Cirrhosis, non-alcoholic ([place]) 09/32/6712  . Esophageal varices- Plavix stopped 12/23/2011  .  GI bleed 12/23/2011  . Idiopathic esophageal varices without bleeding ([place]) 12/23/2011  . Anemia 04/22/2011  . CAD (coronary artery disease) 08/21/2009  . Mixed hyperlipidemia 06/04/2008  . Essential hypertension 06/04/2008  . S/P CABG x 3- 2001 06/04/2008    Past Surgical History:  Procedure Laterality Date  . ABDOMINAL HYSTERECTOMY    . BACK SURGERY    . CARDIAC CATHETERIZATION  2004   left internal mammary artery to the  obtuse marginal  was found to be small and thread like.  The two grafts were patent.  The left circumflex had 90% in-stent restenosis and cutting balloon angioplasty was performed followed by placement of a 3.0 x 51m Taxus drug -eluting stent.    .Marland KitchenCARDIAC CATHETERIZATION  2006   There was in-stent restenosis in the left circumflex and this was treated with cutting balloon angioplasty   . CARDIAC CATHETERIZATION  2008   vein graft to the to the obtuse marginal was patent, although small, left circumflex had 40% in-stent restenosis, ejection fraction 40-45%.  The patient was medically mananged.  .Marland KitchenCARDIAC CATHETERIZATION N/A 01/09/2015   Procedure: Left Heart Cath and Cors/Grafts Angiography;  Surgeon: HBelva Crome MD;  [facility];  Service: Cardiovascular;  Laterality: N/A;  . CATARACT EXTRACTION W/PHACO Right 05/17/2019   Procedure: CATARACT EXTRACTION PHACO AND INTRAOCULAR LENS PLACEMENT (IOC) (CDE: 14.99);  Surgeon: WBaruch Goldmann MD;  Location: [facility];  Service: Ophthalmology;  Laterality: Right;  . CATARACT EXTRACTION W/PHACO Left 06/10/2019   Procedure: CATARACT EXTRACTION PHACO AND INTRAOCULAR LENS PLACEMENT (IOC);  Surgeon: WBaruch Goldmann MD;  Location: [facility];  Service: Ophthalmology;  Laterality: Left;  CDE: 11.96  . cataract sx    . COLONOSCOPY  03/29/2011   Procedure: COLONOSCOPY;  Surgeon: MJamesetta So MD;  Location: [facility];  Service: Gastroenterology;  Laterality: N/A;  . CORONARY ARTERY BYPASS GRAFT  May 31,2001   x 3 with a vein graft to the first diagonal, vein graft to the right coronary  artery, and a free left internal mammary  artery to the obtuse marginal   . ESOPHAGEAL BANDING N/A 07/04/2012   Procedure: ESOPHAGEAL BANDING;  Surgeon: NRogene Houston MD;  Location: [facility];  Service: Endoscopy;  Laterality: N/A;  . ESOPHAGEAL BANDING N/A 09/17/2012   Procedure: ESOPHAGEAL BANDING;  Surgeon: NRogene Houston MD;  [facility]: [facility];   Service: Endoscopy;  Laterality: N/A;  . ESOPHAGEAL BANDING N/A 10/22/2013   Procedure: ESOPHAGEAL BANDING;  Surgeon: NRogene Houston MD;  [facility]: [facility];  Service: Endoscopy;  Laterality: N/A;  . ESOPHAGEAL BANDING N/A 11/28/2014   Procedure: ESOPHAGEAL BANDING;  Surgeon: NRogene Houston MD;  [facility];  Service: Endoscopy;  Laterality: N/A;  . ESOPHAGEAL BANDING N/A 10/29/2015   Procedure: ESOPHAGEAL BANDING;  Surgeon: NRogene Houston MD;  [facility];  Service: Endoscopy;  Laterality: N/A;  . ESOPHAGOGASTRODUODENOSCOPY  12/20/2011   Procedure: ESOPHAGOGASTRODUODENOSCOPY (EGD);  Surgeon: MJamesetta So MD;  [facility];  Service: Gastroenterology;  Laterality: N/A;  . ESOPHAGOGASTRODUODENOSCOPY N/A 07/04/2012   Procedure: ESOPHAGOGASTRODUODENOSCOPY (EGD);  Surgeon: NRogene Houston MD;  [facility];  Service: Endoscopy;  Laterality: N/A;  235-moved to 255 Ann to notify pt  . ESOPHAGOGASTRODUODENOSCOPY N/A 09/17/2012   Procedure: ESOPHAGOGASTRODUODENOSCOPY (EGD);  Surgeon: NRogene Houston MD;  [facility]: [facility];  Service: Endoscopy;  Laterality: N/A;  730  . ESOPHAGOGASTRODUODENOSCOPY N/A 10/22/2013  Procedure: ESOPHAGOGASTRODUODENOSCOPY (EGD);  Surgeon: Rogene Houston, MD;  [facility];  Service: Endoscopy;  Laterality: N/A;  730  . ESOPHAGOGASTRODUODENOSCOPY N/A 11/28/2014   Procedure: ESOPHAGOGASTRODUODENOSCOPY (EGD);  Surgeon: Rogene Houston, MD;  [facility];  Service: Endoscopy;  Laterality: N/A;  1:25  . ESOPHAGOGASTRODUODENOSCOPY N/A 10/29/2015   Procedure: ESOPHAGOGASTRODUODENOSCOPY (EGD);  Surgeon: Rogene Houston, MD;  Location: [facility];  Service: Endoscopy;  Laterality: N/A;  12:00  . ESOPHAGOGASTRODUODENOSCOPY N/A 12/12/2016   Grade 1 and 2 varices in lower third of esophagus, post variceal banding scar distal esophagus, mild portal hypertensive gastropathy, Type 1 gastroesophageal varices  without bleeding, normal duodenum and second portion of duodenum.   [name] FLEXIBLE SIGMOIDOSCOPY N/A 03/20/2017   external hemorrhoids  . JOINT REPLACEMENT    . LEFT HEART CATH AND CORS/GRAFTS ANGIOGRAPHY N/A 08/15/2016   Procedure: Left Heart Cath and Cors/Grafts Angiography;  Surgeon: Jettie Booze, MD;  Location: [place] [facility];  Service: Cardiovascular;  Laterality: N/A;  . LEFT HEART CATHETERIZATION WITH CORONARY/GRAFT ANGIOGRAM N/A 12/25/2013   Procedure: LEFT HEART CATHETERIZATION WITH Beatrix Fetters;  Surgeon: Blane Ohara, MD;  Location[facility] C[facility] LAB;  Service: Cardiovascular;  Laterality: N/A;  . RIGHT HEART CATH N/A 05/29/2017   Procedure: RIGHT HEART CATH;  Surgeon: Jolaine Artist, MD;  Location: [place] [facility];  Service: Cardiovascular;  Laterality: N/A;  . rotator cuff left  2007  . TONSILLECTOMY       OB History   No obstetric history on file.     Family History  Problem Relation Age of Onset  . Heart attack Mother   . Diabetes Mother   . Heart attack Father 34       cause of death  . Diabetes Father   . Coronary artery disease Sister        CABG   . Diabetes Sister   . Glaucoma Sister   . Diabetes Brother   . Colon cancer Neg Hx     Social History   Tobacco Use  . Smoking status: Former Smoker    Packs/day: 0.50    Years: 20.00    Pack years: 10.00    Types: Cigarettes    Quit date: 07/21/1995    Years since quitting: 24.5  . Smokeless tobacco: Never Used  Vaping Use  . Vaping Use: Never used  Substance Use Topics  . Alcohol use: No  . Drug use: No    Home Medications Prior to Admission medications   Medication Sig Start Date End Date Taking? Authorizing Provider  albuterol (PROVENTIL HFA;VENTOLIN HFA) 108 (90 BASE) MCG/ACT inhaler Inhale 2 puffs into the lungs every 6 (six) hours as needed for wheezing or shortness of breath.     [provider]  atorvastatin (LIPITOR) 20 MG tablet Take 20 mg by mouth daily.  01/01/20   [provider]  ezetimibe (ZETIA) 10 MG tablet Take 10 mg by mouth at bedtime.     [provider]  FLUoxetine (PROZAC) 20 MG capsule Take 20 mg by mouth daily.     [provider]  ipratropium (ATROVENT) 0.03 % nasal spray Place 1 spray into both nostrils daily. 12/23/19   [provider]  isosorbide mononitrate (IMDUR) 60 MG 24 hr tablet Take 1 tablet (60 mg total) by mouth daily. 01/29/20   Orson Eva, MD  metoprolol succinate (TOPROL-XL) 25 MG 24 hr tablet Take 0.5 tablets (12.5 mg total) by mouth daily.  01/29/20   Orson Eva, MD  NITROSTAT 0.4 MG SL tablet Place 0.4 mg under the tongue every 5 (five) minutes as needed for chest pain (x 3 tabs daily).  11/15/13   [provider]  Oxycodone HCl 10 MG TABS Take 10 mg by mouth 4 (four) times daily. Patient states that she takes as needed for Pain.    [provider]  pantoprazole (PROTONIX) 40 MG tablet Take 40 mg by mouth daily.    [provider]  polyethylene glycol (MIRALAX / GLYCOLAX) packet Take 17 g by mouth daily.    [provider]  spironolactone (ALDACTONE) 25 MG tablet Take 0.5 tablets (12.5 mg total) by mouth daily. 01/29/20   Orson Eva, MD  torsemide (DEMADEX) 20 MG tablet Take 1 tablet (20 mg total) by mouth daily. 01/28/20 01/22/21  Orson Eva, MD    Allergies    Entresto [sacubitril-valsartan], Acetaminophen, Oxycodone, Ace inhibitors, Cefaclor, Cephalexin, Penicillins, Pregabalin, and Tape  Review of Systems   Review of Systems  Physical Exam Updated Vital Signs BP (!) 94/56   Pulse 81   Resp 17   Ht 5\' 2"  (1.575 m)   Wt 78.3 kg   LMP 03/29/2011   SpO2 93%   BMI 31.57 kg/m   Physical Exam  ED Results / Procedures / Treatments   Labs (all labs ordered are listed, but only abnormal results are displayed) Labs Reviewed  CBC WITH DIFFERENTIAL/PLATELET  COMPREHENSIVE METABOLIC PANEL  BRAIN NATRIURETIC PEPTIDE  PROTIME-INR    EKG  None  Radiology No results found.  Procedures Procedures (including critical care time)  Medications Ordered in ED Medications - No data to display  ED Course  I have reviewed the triage vital signs and the nursing notes.  Pertinent labs & imaging results that were available during my care of the patient were reviewed by me and considered in my medical decision making (see chart for details).    MDM Rules/Calculators/A&P                          *** Final Clinical Impression(s) / ED Diagnoses Final diagnoses:  None    Rx / DC Orders ED Discharge Orders    None

## 2020-02-03 DIAGNOSIS — Z951 Presence of aortocoronary bypass graft: Secondary | ICD-10-CM | POA: Diagnosis not present

## 2020-02-03 DIAGNOSIS — I4819 Other persistent atrial fibrillation: Secondary | ICD-10-CM | POA: Diagnosis present

## 2020-02-03 DIAGNOSIS — I2510 Atherosclerotic heart disease of native coronary artery without angina pectoris: Secondary | ICD-10-CM | POA: Diagnosis present

## 2020-02-03 DIAGNOSIS — R609 Edema, unspecified: Secondary | ICD-10-CM | POA: Diagnosis present

## 2020-02-03 DIAGNOSIS — Z20822 Contact with and (suspected) exposure to covid-19: Secondary | ICD-10-CM | POA: Diagnosis present

## 2020-02-03 DIAGNOSIS — E871 Hypo-osmolality and hyponatremia: Secondary | ICD-10-CM | POA: Diagnosis present

## 2020-02-03 DIAGNOSIS — R600 Localized edema: Secondary | ICD-10-CM | POA: Diagnosis present

## 2020-02-03 DIAGNOSIS — K649 Unspecified hemorrhoids: Secondary | ICD-10-CM | POA: Diagnosis present

## 2020-02-03 DIAGNOSIS — N1831 Chronic kidney disease, stage 3a: Secondary | ICD-10-CM | POA: Diagnosis present

## 2020-02-03 DIAGNOSIS — E8770 Fluid overload, unspecified: Secondary | ICD-10-CM | POA: Diagnosis present

## 2020-02-03 DIAGNOSIS — Z8249 Family history of ischemic heart disease and other diseases of the circulatory system: Secondary | ICD-10-CM | POA: Diagnosis not present

## 2020-02-03 DIAGNOSIS — K219 Gastro-esophageal reflux disease without esophagitis: Secondary | ICD-10-CM | POA: Diagnosis present

## 2020-02-03 DIAGNOSIS — F32A Depression, unspecified: Secondary | ICD-10-CM | POA: Diagnosis present

## 2020-02-03 DIAGNOSIS — K7581 Nonalcoholic steatohepatitis (NASH): Secondary | ICD-10-CM | POA: Diagnosis present

## 2020-02-03 DIAGNOSIS — I8510 Secondary esophageal varices without bleeding: Secondary | ICD-10-CM | POA: Diagnosis present

## 2020-02-03 DIAGNOSIS — Z888 Allergy status to other drugs, medicaments and biological substances status: Secondary | ICD-10-CM | POA: Diagnosis not present

## 2020-02-03 DIAGNOSIS — K7460 Unspecified cirrhosis of liver: Secondary | ICD-10-CM | POA: Diagnosis present

## 2020-02-03 DIAGNOSIS — E785 Hyperlipidemia, unspecified: Secondary | ICD-10-CM | POA: Diagnosis present

## 2020-02-03 DIAGNOSIS — G8929 Other chronic pain: Secondary | ICD-10-CM | POA: Diagnosis present

## 2020-02-03 DIAGNOSIS — F419 Anxiety disorder, unspecified: Secondary | ICD-10-CM | POA: Diagnosis present

## 2020-02-03 DIAGNOSIS — Z880 Allergy status to penicillin: Secondary | ICD-10-CM | POA: Diagnosis not present

## 2020-02-03 DIAGNOSIS — R911 Solitary pulmonary nodule: Secondary | ICD-10-CM | POA: Diagnosis present

## 2020-02-03 DIAGNOSIS — M549 Dorsalgia, unspecified: Secondary | ICD-10-CM | POA: Diagnosis present

## 2020-02-03 DIAGNOSIS — I130 Hypertensive heart and chronic kidney disease with heart failure and stage 1 through stage 4 chronic kidney disease, or unspecified chronic kidney disease: Secondary | ICD-10-CM | POA: Diagnosis present

## 2020-02-03 DIAGNOSIS — E876 Hypokalemia: Secondary | ICD-10-CM | POA: Diagnosis present

## 2020-02-03 DIAGNOSIS — I5042 Chronic combined systolic (congestive) and diastolic (congestive) heart failure: Secondary | ICD-10-CM | POA: Diagnosis present

## 2020-02-03 LAB — COMPREHENSIVE METABOLIC PANEL
ALT: 23 U/L (ref 0–44)
AST: 57 U/L — ABNORMAL HIGH (ref 15–41)
Albumin: 2.3 g/dL — ABNORMAL LOW (ref 3.5–5.0)
Alkaline Phosphatase: 107 U/L (ref 38–126)
Anion gap: 9 (ref 5–15)
BUN: 11 mg/dL (ref 8–23)
CO2: 30 mmol/L (ref 22–32)
Calcium: 8.1 mg/dL — ABNORMAL LOW (ref 8.9–10.3)
Chloride: 95 mmol/L — ABNORMAL LOW (ref 98–111)
Creatinine, Ser: 1.18 mg/dL — ABNORMAL HIGH (ref 0.44–1.00)
GFR, Estimated: 49 mL/min — ABNORMAL LOW (ref >60)
Glucose, Bld: 105 mg/dL — ABNORMAL HIGH (ref 70–99)
Potassium: 2.7 mmol/L — CL (ref 3.5–5.1)
Sodium: 134 mmol/L — ABNORMAL LOW (ref 135–145)
Total Bilirubin: 4.3 mg/dL — ABNORMAL HIGH (ref 0.3–1.2)
Total Protein: 5.7 g/dL — ABNORMAL LOW (ref 6.5–8.1)

## 2020-02-03 LAB — CBC WITH DIFFERENTIAL/PLATELET
Abs Immature Granulocytes: 0.01 10*3/uL (ref 0.00–0.07)
Basophils Absolute: 0 10*3/uL (ref 0.0–0.1)
Basophils Relative: 1 %
Eosinophils Absolute: 0.3 10*3/uL (ref 0.0–0.5)
Eosinophils Relative: 7 %
HCT: 32.4 % — ABNORMAL LOW (ref 36.0–46.0)
Hemoglobin: 10.9 g/dL — ABNORMAL LOW (ref 12.0–15.0)
Immature Granulocytes: 0 %
Lymphocytes Relative: 23 %
Lymphs Abs: 0.9 10*3/uL (ref 0.7–4.0)
MCH: 31.8 pg (ref 26.0–34.0)
MCHC: 33.6 g/dL (ref 30.0–36.0)
MCV: 94.5 fL (ref 80.0–100.0)
Monocytes Absolute: 0.4 10*3/uL (ref 0.1–1.0)
Monocytes Relative: 12 %
Neutro Abs: 2.2 10*3/uL (ref 1.7–7.7)
Neutrophils Relative %: 57 %
Platelets: 43 10*3/uL — ABNORMAL LOW (ref 150–400)
RBC: 3.43 MIL/uL — ABNORMAL LOW (ref 3.87–5.11)
RDW: 17.4 % — ABNORMAL HIGH (ref 11.5–15.5)
WBC: 3.8 10*3/uL — ABNORMAL LOW (ref 4.0–10.5)
nRBC: 0 % (ref 0.0–0.2)

## 2020-02-03 LAB — RESP PANEL BY RT-PCR (FLU A&B, COVID) ARPGX2
Influenza A by PCR: NEGATIVE
Influenza B by PCR: NEGATIVE
SARS Coronavirus 2 by RT PCR: NEGATIVE

## 2020-02-03 LAB — BASIC METABOLIC PANEL
Anion gap: 8 (ref 5–15)
BUN: 12 mg/dL (ref 8–23)
CO2: 31 mmol/L (ref 22–32)
Calcium: 8.2 mg/dL — ABNORMAL LOW (ref 8.9–10.3)
Chloride: 98 mmol/L (ref 98–111)
Creatinine, Ser: 1.05 mg/dL — ABNORMAL HIGH (ref 0.44–1.00)
GFR, Estimated: 57 mL/min — ABNORMAL LOW (ref >60)
Glucose, Bld: 87 mg/dL (ref 70–99)
Potassium: 3.6 mmol/L (ref 3.5–5.1)
Sodium: 137 mmol/L (ref 135–145)

## 2020-02-03 LAB — PROTIME-INR
INR: 2 — ABNORMAL HIGH (ref 0.8–1.2)
Prothrombin Time: 21.9 seconds — ABNORMAL HIGH (ref 11.4–15.2)

## 2020-02-03 LAB — BRAIN NATRIURETIC PEPTIDE: B Natriuretic Peptide: 154 pg/mL — ABNORMAL HIGH (ref 0.0–100.0)

## 2020-02-03 MED ORDER — ACETAMINOPHEN 650 MG RE SUPP: 650.0000 mg | Freq: Four times a day (QID) | RECTAL | Status: DC | PRN

## 2020-02-03 MED ORDER — ATORVASTATIN CALCIUM 20 MG PO TABS
20.0000 mg | ORAL_TABLET | Freq: Every day | ORAL | Status: DC
  Administered 2020-02-03 – 2020-02-04 (×2): 20 mg via ORAL
  Filled 2020-02-03 (×2): qty 1

## 2020-02-03 MED ORDER — POLYETHYLENE GLYCOL 3350 17 G PO PACK: 17.0000 g | PACK | Freq: Every day | ORAL | Status: DC | PRN

## 2020-02-03 MED ORDER — SODIUM CHLORIDE 0.9 % IV SOLN: 250.0000 mL | INTRAVENOUS | Status: DC | PRN

## 2020-02-03 MED ORDER — PANTOPRAZOLE SODIUM 40 MG PO TBEC
40.0000 mg | DELAYED_RELEASE_TABLET | Freq: Every day | ORAL | Status: DC
  Administered 2020-02-03 – 2020-02-04 (×2): 40 mg via ORAL
  Filled 2020-02-03 (×2): qty 1

## 2020-02-03 MED ORDER — POTASSIUM CHLORIDE CRYS ER 20 MEQ PO TBCR
40.0000 meq | EXTENDED_RELEASE_TABLET | Freq: Once | ORAL | Status: AC
  Administered 2020-02-03: 40 meq via ORAL
  Filled 2020-02-03: qty 2

## 2020-02-03 MED ORDER — DIPHENHYDRAMINE HCL 25 MG PO CAPS
25.0000 mg | ORAL_CAPSULE | Freq: Four times a day (QID) | ORAL | Status: DC | PRN
  Administered 2020-02-03 – 2020-02-04 (×2): 25 mg via ORAL
  Filled 2020-02-03 (×2): qty 1

## 2020-02-03 MED ORDER — NITROGLYCERIN 0.4 MG SL SUBL: 0.4000 mg | SUBLINGUAL_TABLET | SUBLINGUAL | Status: DC | PRN

## 2020-02-03 MED ORDER — SODIUM CHLORIDE 0.9% FLUSH
3.0000 mL | Freq: Two times a day (BID) | INTRAVENOUS | Status: DC
  Administered 2020-02-03 – 2020-02-04 (×2): 3 mL via INTRAVENOUS

## 2020-02-03 MED ORDER — TORSEMIDE 20 MG PO TABS
40.0000 mg | ORAL_TABLET | Freq: Every day | ORAL | Status: DC
  Administered 2020-02-03 – 2020-02-04 (×2): 40 mg via ORAL
  Filled 2020-02-03 (×4): qty 2

## 2020-02-03 MED ORDER — ONDANSETRON HCL 4 MG/2ML IJ SOLN
4.0000 mg | Freq: Four times a day (QID) | INTRAMUSCULAR | Status: DC | PRN
  Administered 2020-02-03: 4 mg via INTRAVENOUS
  Filled 2020-02-03: qty 2

## 2020-02-03 MED ORDER — WITCH HAZEL-GLYCERIN EX PADS: MEDICATED_PAD | CUTANEOUS | Status: DC | PRN

## 2020-02-03 MED ORDER — HYDROCORTISONE ACETATE 25 MG RE SUPP
25.0000 mg | Freq: Two times a day (BID) | RECTAL | Status: DC
  Administered 2020-02-03 – 2020-02-04 (×2): 25 mg via RECTAL
  Filled 2020-02-03 (×2): qty 1

## 2020-02-03 MED ORDER — SPIRONOLACTONE 25 MG PO TABS
25.0000 mg | ORAL_TABLET | Freq: Every day | ORAL | Status: DC
  Administered 2020-02-03 – 2020-02-04 (×2): 25 mg via ORAL
  Filled 2020-02-03 (×4): qty 1

## 2020-02-03 MED ORDER — ACETAMINOPHEN 325 MG PO TABS: 650.0000 mg | ORAL_TABLET | Freq: Four times a day (QID) | ORAL | Status: DC | PRN

## 2020-02-03 MED ORDER — METOPROLOL SUCCINATE ER 25 MG PO TB24
12.5000 mg | ORAL_TABLET | Freq: Every day | ORAL | Status: DC
  Administered 2020-02-03 – 2020-02-04 (×2): 12.5 mg via ORAL
  Filled 2020-02-03 (×2): qty 1

## 2020-02-03 MED ORDER — ALBUTEROL SULFATE HFA 108 (90 BASE) MCG/ACT IN AERS: 2.0000 | INHALATION_SPRAY | Freq: Four times a day (QID) | RESPIRATORY_TRACT | Status: DC | PRN

## 2020-02-03 MED ORDER — EZETIMIBE 10 MG PO TABS
10.0000 mg | ORAL_TABLET | Freq: Every day | ORAL | Status: DC
  Administered 2020-02-03: 10 mg via ORAL
  Filled 2020-02-03 (×3): qty 1

## 2020-02-03 MED ORDER — FLUOXETINE HCL 20 MG PO CAPS
20.0000 mg | ORAL_CAPSULE | Freq: Every day | ORAL | Status: DC
  Administered 2020-02-03 – 2020-02-04 (×2): 20 mg via ORAL
  Filled 2020-02-03 (×2): qty 1

## 2020-02-03 MED ORDER — ONDANSETRON HCL 4 MG PO TABS: 4.0000 mg | ORAL_TABLET | Freq: Four times a day (QID) | ORAL | Status: DC | PRN

## 2020-02-03 MED ORDER — SODIUM CHLORIDE 0.9% FLUSH: 3.0000 mL | INTRAVENOUS | Status: DC | PRN

## 2020-02-03 MED ORDER — OXYCODONE HCL 5 MG PO TABS
10.0000 mg | ORAL_TABLET | Freq: Four times a day (QID) | ORAL | Status: DC | PRN
  Administered 2020-02-03 – 2020-02-04 (×3): 10 mg via ORAL
  Filled 2020-02-03 (×3): qty 2

## 2020-02-03 NOTE — ED Notes (Signed)
CRITICAL VALUE ALERT  Critical Value:  Potassium 2.7 Date & Time Notied:  02/03/20 @ 0036 Provider Notified: Dr Eliane Decree Orders Received/Actions taken: None yet

## 2020-02-03 NOTE — ED Notes (Signed)
Report given to [facility] at this time for hand-off.

## 2020-02-03 NOTE — H&P (Signed)
History and Physical    Samantha [name] VQM:[id number] DOB: [birth year] DOA: 02/02/2020  PCP: [name], MD   Patient coming from: Home  Chief Complaint: Lower extremity edema  HPI: Samantha [name] is a 69 y.o. female with medical history significant for systolic and diastolic CHF, [place] liver cirrhosis, hypertension, dyslipidemia, CKD stage III, pancytopenia associated with liver cirrhosis, paroxysmal atrial fibrillation-currently in sinus rhythm who presented to the ED with worsening lower extremity edema that has occurred since her recent discharge on 11/30 after being treated for intractable nausea and vomiting with abdominal pain.  Her blood pressures were noted to be soft at that time of discharge and her home diuretic medication was decreased as a result.  She was in contact with her cardiologist on 12/3 with recommendation to resume her usual dose of torsemide since she was having worsening lower extremity edema, but this continued to persist despite her taking her home medication.  She was also not compliant with taking her home potassium as previously prescribed and also presents with a lower potassium level today.   ED Course: Stable vital signs noted this morning and she has been started on some oral potassium for hypokalemia noted with potassium 2.7.  Creatinine is currently 1.18 and BNP 154.  She is noted to be pancytopenic and Covid testing is negative.  She appears to be having good urine output and is able to lay flat with no shortness of breath or hypoxemia noted.  Chest x-ray demonstrates some central vascular congestion without any edema and BNP is 154.  Review of Systems: All others reviewed as noted above and otherwise negative.  Past Medical History:  Diagnosis Date  . Cataract    OU  . Chronic combined systolic and diastolic CHF (congestive heart failure) ([place])   . Cirrhosis of liver ([place])   . CKD (chronic kidney disease), stage III ([place])   . Coronary artery disease     a. s/p CABG 2001 w/ (LIMA-OM, SVG-D1, SVG-RCA). b. h/o multiple PCIs per Dr. Antionette Char note.  . Depression   . Esophageal varices ([place])    New 2013  . Gastroesophageal reflux disease   . History of pneumonia   . Hyperlipidemia   . Hypertension   . Hypertensive retinopathy    OU  . Obesity   . Osteoarthritis   . Pancytopenia ([place])   . Paroxysmal atrial flutter ([place])    a. dx 05/2016.  [name] Persistent atrial fibrillation ([place])    a. reported in hosp 07/2016, not on anticoag due to cirrhosis and liver disease, low platelets, varices.  . Thrombocytopenia ([place])     Past Surgical History:  Procedure Laterality Date  . ABDOMINAL HYSTERECTOMY    . BACK SURGERY    . CARDIAC CATHETERIZATION  2004   left internal mammary artery to the obtuse marginal  was found to be small and thread like.  The two grafts were patent.  The left circumflex had 90% in-stent restenosis and cutting balloon angioplasty was performed followed by placement of a 3.0 x 54m Taxus drug -eluting stent.    .Marland KitchenCARDIAC CATHETERIZATION  2006   There was in-stent restenosis in the left circumflex and this was treated with cutting balloon angioplasty   . CARDIAC CATHETERIZATION  2008   vein graft to the to the obtuse marginal was patent, although small, left circumflex had 40% in-stent restenosis, ejection fraction 40-45%.  The patient was medically mananged.  .Marland KitchenCARDIAC CATHETERIZATION N/A 01/09/2015   Procedure: Left Heart  Cath and Cors/Grafts Angiography;  Surgeon: Belva Crome, MD;  Location: [facility];  Service: Cardiovascular;  Laterality: N/A;  . CATARACT EXTRACTION W/PHACO Right 05/17/2019   Procedure: CATARACT EXTRACTION PHACO AND INTRAOCULAR LENS PLACEMENT (IOC) (CDE: 14.99);  Surgeon: Baruch Goldmann, MD;  Location: [facility];  Service: Ophthalmology;  Laterality: Right;  . CATARACT EXTRACTION W/PHACO Left 06/10/2019   Procedure: CATARACT EXTRACTION PHACO AND INTRAOCULAR LENS PLACEMENT (IOC);  Surgeon: Baruch Goldmann, MD;  Location: [facility];  Service: Ophthalmology;  Laterality: Left;  CDE: 11.96  . cataract sx    . COLONOSCOPY  03/29/2011   Procedure: COLONOSCOPY;  Surgeon: Jamesetta So, MD;  [facility];  Service: Gastroenterology;  Laterality: N/A;  . CORONARY ARTERY BYPASS GRAFT  May 31,2001   x 3 with a vein graft to the first diagonal, vein graft to the right coronary  artery, and a free left internal mammary  artery to the obtuse marginal   . ESOPHAGEAL BANDING N/A 07/04/2012   Procedure: ESOPHAGEAL BANDING;  Surgeon: Rogene Houston, MD;  [facility];  Service: Endoscopy;  Laterality: N/A;  . ESOPHAGEAL BANDING N/A 09/17/2012   Procedure: ESOPHAGEAL BANDING;  Surgeon: Rogene Houston, MD;  [facility];  Service: Endoscopy;  Laterality: N/A;  . ESOPHAGEAL BANDING N/A 10/22/2013   Procedure: ESOPHAGEAL BANDING;  Surgeon: Rogene Houston, MD;  [facility];  Service: Endoscopy;  Laterality: N/A;  . ESOPHAGEAL BANDING N/A 11/28/2014   Procedure: ESOPHAGEAL BANDING;  Surgeon: Rogene Houston, MD;  [facility];  Service: Endoscopy;  Laterality: N/A;  . ESOPHAGEAL BANDING N/A 10/29/2015   Procedure: ESOPHAGEAL BANDING;  Surgeon: Rogene Houston, MD;  [facility];  Service: Endoscopy;  Laterality: N/A;  . ESOPHAGOGASTRODUODENOSCOPY  12/20/2011   Procedure: ESOPHAGOGASTRODUODENOSCOPY (EGD);  Surgeon: Jamesetta So, MD;  [facility];  Service: Gastroenterology;  Laterality: N/A;  . ESOPHAGOGASTRODUODENOSCOPY N/A 07/04/2012   Procedure: ESOPHAGOGASTRODUODENOSCOPY (EGD);  Surgeon: Rogene Houston, MD;  [facility];  Service: Endoscopy;  Laterality: N/A;  235-moved to 255 Ann to notify pt  . ESOPHAGOGASTRODUODENOSCOPY N/A 09/17/2012   Procedure: ESOPHAGOGASTRODUODENOSCOPY (EGD);  Surgeon: Rogene Houston, MD;  [facility];  Service: Endoscopy;  Laterality: N/A;  730  . ESOPHAGOGASTRODUODENOSCOPY N/A 10/22/2013    Procedure: ESOPHAGOGASTRODUODENOSCOPY (EGD);  Surgeon: Rogene Houston, MD;  [facility];  Service: Endoscopy;  Laterality: N/A;  730  . ESOPHAGOGASTRODUODENOSCOPY N/A 11/28/2014   Procedure: ESOPHAGOGASTRODUODENOSCOPY (EGD);  Surgeon: Rogene Houston, MD;  [facility];  Service: Endoscopy;  Laterality: N/A;  1:25  . ESOPHAGOGASTRODUODENOSCOPY N/A 10/29/2015   Procedure: ESOPHAGOGASTRODUODENOSCOPY (EGD);  Surgeon: Rogene Houston, MD;  [facility];  Service: Endoscopy;  Laterality: N/A;  12:00  . ESOPHAGOGASTRODUODENOSCOPY N/A 12/12/2016   Grade 1 and 2 varices in lower third of esophagus, post variceal banding scar distal esophagus, mild portal hypertensive gastropathy, Type 1 gastroesophageal varices without bleeding, normal duodenum and second portion of duodenum.   [name] FLEXIBLE SIGMOIDOSCOPY N/A 03/20/2017   external hemorrhoids  . JOINT REPLACEMENT    . LEFT HEART CATH AND CORS/GRAFTS ANGIOGRAPHY N/A 08/15/2016   Procedure: Left Heart Cath and Cors/Grafts Angiography;  Surgeon: Jettie Booze, MD;  Location: [facility];  Service: Cardiovascular;  Laterality: N/A;  . LEFT HEART CATHETERIZATION WITH CORONARY/GRAFT ANGIOGRAM N/A 12/25/2013   Procedure: LEFT HEART CATHETERIZATION WITH CORONARY/GRAFT ANGIOGRAM;  Surgeon: Blane Ohara, MD;  Location: [facility];  Service: Cardiovascular;  Laterality: N/A;  . RIGHT HEART CATH N/A 05/29/2017   Procedure: RIGHT HEART CATH;  Surgeon: Jolaine Artist, MD;  Location: [facility];  Service: Cardiovascular;  Laterality: N/A;  . rotator cuff left  2007  . TONSILLECTOMY       reports that she quit smoking about 24 years ago. Her smoking use included cigarettes. She has a 10.00 pack-year smoking history. She has never used smokeless tobacco. She reports that she does not drink alcohol and does not use drugs.  Allergies  Allergen Reactions  . Entresto [Sacubitril-Valsartan] Swelling    Swelling  of eyes  . Acetaminophen Other (See Comments)    Liver problems  . Oxycodone Itching and Nausea Only  . Ace Inhibitors Other (See Comments)    UNSURE OF REACTION TYPE   . Cefaclor Itching and Rash  . Cephalexin Itching and Rash  . Penicillins Rash    Has patient had a PCN reaction causing immediate rash, facial/tongue/throat swelling, SOB or lightheadedness with hypotension: YES Has patient had a PCN reaction causing severe rash involving mucus membranes or skin necrosis: NO Has patient had a PCN reaction that required hospitalization: YES Has patient had a PCN reaction occurring within the last 10 years: NO If all of the above answers are "NO", then may proceed with Cephalosporin use.   . Pregabalin Other (See Comments)    Retains fluid  . Tape Itching and Rash    Takes skin right off with medical tape--PAPER TAPE ONLY    Family History  Problem Relation Age of Onset  . Heart attack Mother   . Diabetes Mother   . Heart attack Father 31       cause of death  . Diabetes Father   . Coronary artery disease Sister        CABG   . Diabetes Sister   . Glaucoma Sister   . Diabetes Brother   . Colon cancer Neg Hx     Prior to Admission medications   Medication Sig Start Date End Date Taking? Authorizing Provider  albuterol (PROVENTIL HFA;VENTOLIN HFA) 108 (90 BASE) MCG/ACT inhaler Inhale 2 puffs into the lungs every 6 (six) hours as needed for wheezing or shortness of breath.    Yes [provider]  atorvastatin (LIPITOR) 20 MG tablet Take 20 mg by mouth daily. 01/01/20  Yes [provider]  ezetimibe (ZETIA) 10 MG tablet Take 10 mg by mouth at bedtime.    Yes [provider]  FLUoxetine (PROZAC) 20 MG capsule Take 20 mg by mouth daily.    Yes [provider]  ipratropium (ATROVENT) 0.03 % nasal spray Place 1 spray into both nostrils daily as needed (allergies).  12/23/19  Yes [provider]  isosorbide mononitrate (IMDUR) 60 MG 24 hr  tablet Take 1 tablet (60 mg total) by mouth daily. 01/29/20  Yes Tat, Shanon Brow, MD  metoprolol succinate (TOPROL-XL) 25 MG 24 hr tablet Take 0.5 tablets (12.5 mg total) by mouth daily. 01/29/20  Yes Tat, Shanon Brow, MD  Oxycodone HCl 10 MG TABS Take 10 mg by mouth 4 (four) times daily as needed (pain).    Yes [provider]  pantoprazole (PROTONIX) 40 MG tablet Take 40 mg by mouth daily.   Yes [provider]  polyethylene glycol (MIRALAX / GLYCOLAX) packet Take 17 g by mouth daily as needed for mild constipation.    Yes [provider]  spironolactone (ALDACTONE) 25 MG tablet Take 0.5 tablets (12.5 mg total) by mouth daily. Patient taking differently: Take 25 mg by mouth daily.  01/29/20  Yes Tat, Shanon Brow, MD  torsemide (DEMADEX) 20 MG tablet Take 1 tablet (20 mg total) by mouth daily. Patient taking differently: Take 40 mg by mouth daily.  01/28/20 01/22/21 Yes Tat, Shanon Brow, MD  NITROSTAT 0.4 MG SL tablet Place 0.4 mg under the tongue every 5 (five) minutes as needed for chest pain (x 3 tabs daily).  11/15/13   [provider]    Physical Exam: Vitals:   02/03/20 0845 02/03/20 0900 02/03/20 0915 02/03/20 0930  BP:  (!) 105/50  (!) 106/56  Pulse:    83  Resp: 16 15 16 20   Temp:      TempSrc:      SpO2:    95%  Weight:      Height:        Constitutional: NAD, calm, comfortable Vitals:   02/03/20 0845 02/03/20 0900 02/03/20 0915 02/03/20 0930  BP:  (!) 105/50  (!) 106/56  Pulse:    83  Resp: 16 15 16 20   Temp:      TempSrc:      SpO2:    95%  Weight:      Height:       Eyes: lids and conjunctivae normal ENMT: Mucous membranes are moist.  Neck: normal, supple Respiratory: clear to auscultation bilaterally. Normal respiratory effort. No accessory muscle use.  Cardiovascular: Regular rate and rhythm, no murmurs. No extremity edema. Abdomen: no tenderness, no distention. Bowel sounds positive.  Musculoskeletal:  No joint deformity upper and lower  extremities.   Skin: no rashes, lesions, ulcers.  Psychiatric: Normal judgment and insight. Alert and oriented x 3. Normal mood.   Labs on Admission: I have personally reviewed following labs and imaging studies  CBC: Recent Labs  Lab 01/28/20 0527 02/02/20 2347  WBC 3.4* 3.8*  NEUTROABS  --  2.2  HGB 11.6* 10.9*  HCT 35.2* 32.4*  MCV 98.6 94.5  PLT 38* 43*   Basic Metabolic Panel: Recent Labs  Lab 01/28/20 0527 02/02/20 2347  NA 136 134*  K 3.4* 2.7*  CL 100 95*  CO2 28 30  GLUCOSE 92 105*  BUN 13 11  CREATININE 1.06* 1.18*  CALCIUM 8.5* 8.1*   GFR: Estimated Creatinine Clearance: 42.4 mL/min (A) (by C-G formula based on SCr of 1.18 mg/dL (H)). Liver Function Tests: Recent Labs  Lab 01/28/20 0527 02/02/20 2347  AST 46* 57*  ALT 20 23  ALKPHOS 68 107  BILITOT 3.9* 4.3*  PROT 5.5* 5.7*  ALBUMIN 2.3* 2.3*   No results for input(s): LIPASE, AMYLASE in the last 168 hours. No results for input(s): AMMONIA in the last 168 hours. Coagulation Profile: Recent Labs  Lab 01/28/20 0527 02/02/20 2347  INR 1.8* 2.0*   Cardiac Enzymes: No results for input(s): CKTOTAL, CKMB, CKMBINDEX, TROPONINI in the last 168 hours. BNP (last 3 results) No results for input(s): PROBNP in the last 8760 hours. HbA1C: No results for input(s): HGBA1C in the last 72 hours. CBG: No results for input(s): GLUCAP in the last 168 hours. Lipid Profile: No results for input(s): CHOL, HDL, LDLCALC, TRIG, CHOLHDL, LDLDIRECT in the last 72 hours. Thyroid Function Tests: No results for input(s): TSH, T4TOTAL, FREET4, T3FREE, THYROIDAB in the last 72 hours. Anemia Panel: No results for input(s): VITAMINB12, FOLATE, FERRITIN, TIBC, IRON, RETICCTPCT in the last 72 hours. Urine analysis:  Component Value Date/Time   COLORURINE YELLOW 05/19/2019 2015   APPEARANCEUR HAZY (A) 05/19/2019 2015   LABSPEC 1.013 05/19/2019 2015   PHURINE 5.0 05/19/2019 2015   GLUCOSEU NEGATIVE 05/19/2019 2015    HGBUR NEGATIVE 05/19/2019 2015   BILIRUBINUR NEGATIVE 05/19/2019 2015   KETONESUR NEGATIVE 05/19/2019 2015   PROTEINUR NEGATIVE 05/19/2019 2015   NITRITE NEGATIVE 05/19/2019 2015   LEUKOCYTESUR NEGATIVE 05/19/2019 2015    Radiological Exams on Admission: DG Chest Portable 1 View  Result Date: 02/02/2020 CLINICAL DATA:  Volume overload, lower extremity edema EXAM: PORTABLE CHEST 1 VIEW COMPARISON:  01/23/2020 FINDINGS: Single frontal view of the chest demonstrates stable enlarged cardiac silhouette. Postsurgical changes are seen from median sternotomy. There is chronic central vascular congestion without airspace disease, effusion, or pneumothorax. IMPRESSION: 1. Central vascular congestion without edema. Electronically Signed   By: Randa Ngo M.D.   On: 02/02/2020 23:09    EKG: Independently reviewed. SR 88bpm.  Assessment/Plan Active Problems:   Peripheral edema    Volume overload with bilateral lower extremity edema -In the setting of recent decreased diuretic dose related to hypotension -Plan to continue on regular home dose of spironolactone and torsemide which appear to be helping -Avoid IV diuretics due to concern for significant hypotension -Monitor carefully with PT evaluation once improved -Monitor input and output as well as daily weights  Hypokalemia -Replete orally and recheck BMP, plan to administer IV supplementation as needed -Monitor on telemetry  Chronic systolic and diastolic CHF -2D echocardiogram 01/24/2020 with LVEF 35-40% with hypokinesis of the inferior wall and inferolateral wall and mild to moderate MR -Continue metoprolol and spironolactone -Plan to follow-up with Dr. Burt Knack outpatient cardiology -Plan to hold Imdur for now  [facility] liver cirrhosis -No significant ascites currently present -Plan to continue spironolactone and torsemide as prior  CKD stage IIIa -Baseline creatinine 0.9-1.1 -Continue to monitor with repeat labs on diuretics  Mild  hyponatremia -Monitor carefully on diuretics  Hematochezia -Stable hemoglobin levels noted -Related to hemorrhoids -Continue monitor CBC -Plan to administer suppository or Tucks pad for relief of symptoms  Persistent atrial fibrillation-currently in sinus rhythm -Patient not a candidate for anticoagulation due to his cirrhosis, esophageal varices and severe thrombocytopenia  Pancytopenia -Secondary to liver cirrhosis  Essential hypertension -Monitor soft blood pressure readings while on full diuretic doses -Continue metoprolol -Plan to hold Imdur for now  Dyslipidemia -Continue on Zetia and statin  Anxiety/depression -Continue fluoxetine  Lung nodule -Incidental finding of left lower lobe-6 mm, plan for outpatient surveillance  DVT prophylaxis: SCDs Code Status: Full Family Communication: None at bedside Disposition Plan:Admit for diuresis Consults called:None Admission status: Inpatient, Tele   Zariya Minner D Izyk Marty DO [facility]  If 7PM-7AM, please contact night-coverage [email or web address]  02/03/2020, 10:54 AM

## 2020-02-03 NOTE — ED Provider Notes (Signed)
Patient was left at change of shift to get the results of her laboratory test.  Patient has Samantha [name] and history of congestive heart failure.  She has been having her diuretics increased as an outpatient without improvement.  She has had a 7 pound weight gain.  She denies any shortness of breath but states she has so much swelling in her extremities she cannot wear her socks or shoes anymore.  When I talked to the patient she is mainly focused on her chronic back pain that she is takes oxycodone for.  She had been given Lasix by Dr. Alvino Chapel and at time of my exam she needed to have urinary output.  4:21 AM Dr. Josephine Cables, hospitalist will admit.  Results for orders placed or performed during the hospital encounter of 02/02/20  Resp Panel by RT-PCR (Flu A&B, Covid) Nasopharyngeal Swab   Specimen: Nasopharyngeal Swab; Nasopharyngeal(NP) swabs in vial transport medium  Result Value Ref Range   SARS Coronavirus 2 by RT PCR NEGATIVE NEGATIVE   Influenza A by PCR NEGATIVE NEGATIVE   Influenza B by PCR NEGATIVE NEGATIVE  CBC with Differential  Result Value Ref Range   WBC 3.8 (L) 4.0 - 10.5 K/uL   RBC 3.43 (L) 3.87 - 5.11 MIL/uL   Hemoglobin 10.9 (L) 12.0 - 15.0 g/dL   HCT 32.4 (L) 36 - 46 %   MCV 94.5 80.0 - 100.0 fL   MCH 31.8 26.0 - 34.0 pg   MCHC 33.6 30.0 - 36.0 g/dL   RDW 17.4 (H) 11.5 - 15.5 %   Platelets 43 (L) 150 - 400 K/uL   nRBC 0.0 0.0 - 0.2 %   Neutrophils Relative % 57 %   Neutro Abs 2.2 1.7 - 7.7 K/uL   Lymphocytes Relative 23 %   Lymphs Abs 0.9 0.7 - 4.0 K/uL   Monocytes Relative 12 %   Monocytes Absolute 0.4 0.1 - 1.0 K/uL   Eosinophils Relative 7 %   Eosinophils Absolute 0.3 0.0 - 0.5 K/uL   Basophils Relative 1 %   Basophils Absolute 0.0 0.0 - 0.1 K/uL   Immature Granulocytes 0 %   Abs Immature Granulocytes 0.01 0.00 - 0.07 K/uL  Comprehensive metabolic panel  Result Value Ref Range   Sodium 134 (L) 135 - 145 mmol/L   Potassium 2.7 (LL) 3.5 - 5.1 mmol/L   Chloride 95  (L) 98 - 111 mmol/L   CO2 30 22 - 32 mmol/L   Glucose, Bld 105 (H) 70 - 99 mg/dL   BUN 11 8 - 23 mg/dL   Creatinine, Ser 1.18 (H) 0.44 - 1.00 mg/dL   Calcium 8.1 (L) 8.9 - 10.3 mg/dL   Total Protein 5.7 (L) 6.5 - 8.1 g/dL   Albumin 2.3 (L) 3.5 - 5.0 g/dL   AST 57 (H) 15 - 41 U/L   ALT 23 0 - 44 U/L   Alkaline Phosphatase 107 38 - 126 U/L   Total Bilirubin 4.3 (H) 0.3 - 1.2 mg/dL   GFR, Estimated 49 (L) >60 mL/min   Anion gap 9 5 - 15  Brain natriuretic peptide  Result Value Ref Range   B Natriuretic Peptide 154.0 (H) 0.0 - 100.0 pg/mL  Protime-INR  Result Value Ref Range   Prothrombin Time 21.9 (H) 11.4 - 15.2 seconds   INR 2.0 (H) 0.8 - 1.2    Laboratory interpretation all normal except mild anemia, stable thrombocytopenia, hypokalemia, renal insufficiency, malnutrition  CT ABDOMEN PELVIS W CONTRAST  Result Date: 01/24/2020 CLINICAL  DATA:  Abdominal pain and fever.  IMPRESSION: 1. No acute findings identified within the abdomen or pelvis. 2. Advanced changes of cirrhosis and portal venous hypertension. New upper abdominal ascites. 3. New 6 mm ground-glass nodule identified within the left lower lobe. Likely postinflammatory Initial follow-up with CT at 6-12 months is recommended to confirm persistence. If persistent, repeat CT is recommended every 2 years until 5 years of stability has been established. This recommendation follows the consensus statement: Guidelines for Management of Incidental Pulmonary Nodules Detected on CT Images: From the Fleischner Society 2017; Radiology 2017; 284:228-243. 4. Aortic atherosclerosis. Aortic Atherosclerosis (ICD10-I70.0). Electronically Signed   By: Kerby Moors M.D.   On: 01/24/2020 14:14   US Venous Img Lower Bilateral (DVT)  Result Date: 01/24/2020 CLINICAL DATA:  Left leg swelling.[name] IMPRESSION: No evidence of deep venous thrombosis in either lower extremity. Electronically Signed   By: Markus Daft M.D.   On: 01/24/2020 10:54   DG Chest  Portable 1 View  Result Date: 02/02/2020 CLINICAL DATA:  Volume overload, lower extremity edema EXAM: PORTABLE CHEST 1 VIEW COMPARISON:  01/23/2020 FINDINGS: Single frontal view of the chest demonstrates stable enlarged cardiac silhouette. Postsurgical changes are seen from median sternotomy. There is chronic central vascular congestion without airspace disease, effusion, or pneumothorax. IMPRESSION: 1. Central vascular congestion without edema. Electronically Signed   By: Randa Ngo M.D.   On: 02/02/2020 23:09    ECHOCARDIOGRAM COMPLETE  Result Date: 01/24/2020    ECHOCARDIOGRAM REPORT   Patient Name:   Samantha [name] Date of Exam: 01/24/2020  IFINDINGS  Left Ventricle: Left ventricular ejection fraction, by estimation, is 35 to 40%. The left ventricle has moderately decreased function. The left ventricle demonstrates global hypokinesis. Severe hypokinesis of the left ventricular, entire inferior wall and inferolateral wall. The left ventricular internal cavity size was normal in size. There is no left ventricular hypertrophy. Abnormal (paradoxical) septal motion consistent with post-operative status. Left ventricular diastolic function could not be evaluated due to atrial fibrillation. Left ventricular diastolic function could not be evaluated. Right Ventricle: The right ventricular size is normal. No increase in right ventricular wall thickness. Right ventricular systolic function is mildly reduced. There is mildly elevated pulmonary artery systolic pressure. The tricuspid regurgitant velocity  is 2.75 m/s, and with an assumed right atrial pressure of 8 mmHg, the estimated right ventricular systolic pressure is 65.6 mmHg. Left Atrium: Left atrial size was severely dilated. Right Atrium: Right atrial size was severely dilated. Pericardium: There is no evidence of pericardial effusion. Mitral Valve: The mitral valve is normal in structure. Mild mitral annular calcification. Mild to moderate mitral valve  regurgitation. Tricuspid Valve: The tricuspid valve is normal in structure. Tricuspid valve regurgitation is moderate. Aortic Valve: The aortic valve is tricuspid. Aortic valve regurgitation is not visualized. Mild aortic valve sclerosis is present, with no evidence of aortic valve stenosis. Pulmonic Valve: The pulmonic valve was normal in structure. Pulmonic valve regurgitation is not visualized. Aorta: The aortic root and ascending aorta are structurally normal, with no evidence of dilitation. Venous: The inferior vena cava is normal in size with less than 50% respiratory variability, suggesting right atrial pressure of 8 mmHg. IAS/Shunts: No atrial level shunt detected by color flow Doppler. Electronically signed by Sanda Klein MD Signature Date/Time: 01/24/2020/4:45:09 PM    Final    US LIVER DOPPLER  Result Date: 01/27/2020 CLINICAL DATA:  Cirrhosis.  IMPRESSION: 1. 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 2. Portal  venous system is patent with normal direction of flow. Electronically Signed   By: Markus Daft M.D.   On: 01/27/2020 13:52   Diagnoses that have been ruled out:  None  Diagnoses that are still under consideration:  None  Final diagnoses:  Peripheral edema  NASH (nonalcoholic steatohepatitis)  Congestive heart failure, unspecified HF chronicity, unspecified heart failure type [facility])   Plan admission  Rolland Porter, MD, Barbette Or, MD 02/03/20 [phone]

## 2020-02-03 NOTE — ED Notes (Signed)
Handoff report received from [place], [place].

## 2020-02-04 DIAGNOSIS — R609 Edema, unspecified: Secondary | ICD-10-CM | POA: Diagnosis not present

## 2020-02-04 MED ORDER — POTASSIUM CHLORIDE ER 10 MEQ PO TBCR: 10.0000 meq | EXTENDED_RELEASE_TABLET | Freq: Two times a day (BID) | ORAL | 2 refills | Status: DC

## 2020-02-04 MED ORDER — TORSEMIDE 20 MG PO TABS: 40.0000 mg | ORAL_TABLET | Freq: Every day | ORAL | 0 refills | Status: DC

## 2020-02-04 MED ORDER — HYDROCORTISONE ACETATE 25 MG RE SUPP
25.0000 mg | Freq: Two times a day (BID) | RECTAL | 0 refills | Status: DC
Start: 2020-02-04 — End: 2020-02-19

## 2020-02-04 MED ORDER — SPIRONOLACTONE 25 MG PO TABS: 25.0000 mg | ORAL_TABLET | Freq: Every day | ORAL | 0 refills | Status: DC

## 2020-02-04 MED ORDER — ONDANSETRON 4 MG PO TBDP: 4.0000 mg | ORAL_TABLET | Freq: Three times a day (TID) | ORAL | 0 refills | Status: AC | PRN

## 2020-02-04 NOTE — Progress Notes (Signed)
Discharge instructions given to patient. Education on medications given, all belongings sent with patient. Answered all questions at this time. Await on brother for transportation home.

## 2020-02-04 NOTE — Plan of Care (Signed)

## 2020-02-04 NOTE — Discharge Summary (Signed)
Physician Discharge Summary  Samantha [name] YSA:[id number] DOB: 07/04/1950 DOA: 02/02/2020  PCP: [name], MD  Admit date: 02/02/2020  Discharge date: 02/04/2020  Admitted From:Home  Disposition:  Home  Recommendations for Outpatient Follow-up:  1. Follow up with PCP in 1-2 weeks 2. Please obtain BMP in one week 3. Follow-up with cardiologist Dr. Burt Knack in the next 1-2 weeks for hospital follow-up 4. Continue on usual torsemide dose of 40 mg daily as well as spironolactone 25 mg daily as recommended by cardiologists previously and continue now on potassium supplementation of 10 mEq twice daily 5. Patient given Zofran as needed for nausea or vomiting 6. Rectal suppositories given for hemorrhoid symptoms  Home Health: None  Equipment/Devices: None  Discharge Condition: Stable  CODE STATUS: Full  Diet recommendation: Heart Healthy  Brief/Interim Summary: Per HPI: Samantha [name] is a 69 y.o. female with medical history significant for systolic and diastolic CHF, [place] liver cirrhosis, hypertension, dyslipidemia, CKD stage III, pancytopenia associated with liver cirrhosis, paroxysmal atrial fibrillation-currently in sinus rhythm who presented to the ED with worsening lower extremity edema that has occurred since her recent discharge on 11/30 after being treated for intractable nausea and vomiting with abdominal pain.  Her blood pressures were noted to be soft at that time of discharge and her home diuretic medication was decreased as a result.  She was in contact with her cardiologist on 12/3 with recommendation to resume her usual dose of torsemide since she was having worsening lower extremity edema, but this continued to persist despite her taking her home medication.  She was also not compliant with taking her home potassium as previously prescribed and also presents with a lower potassium level today.   ED Course: Stable vital signs noted this morning and she has been started on  some oral potassium for hypokalemia noted with potassium 2.7.  Creatinine is currently 1.18 and BNP 154.  She is noted to be pancytopenic and Covid testing is negative.  She appears to be having good urine output and is able to lay flat with no shortness of breath or hypoxemia noted.  Chest x-ray demonstrates some central vascular congestion without any edema and BNP is 154.  -Patient was admitted with volume overload with bilateral lower extremity edema in the setting of recent decrease diuretic dose from discharge on 11/30.  At that time there was some significant concerns of hypotension noted which precluded the use of higher dose of diuretics.  Fortunately, her blood pressures have improved and she was told in the outpatient setting to increase her torsemide dose which she had and was already diuresing quite well prior to admission.  She did not have any symptoms of orthopnea or dyspnea and simply complained about the fact that she could not put on her shoes very well due to the edema or ambulate very well.  Fortunately, overnight she had done quite well with diuresis on her usual home regimen of spironolactone and torsemide as noted above.  She only has some mild ankle edema at this time.  Unfortunately urine output was not well recorded overnight, but she is overall feeling much better and eager to go home.  I feel encouraged by the fact that she has had such significant improvement with her usual home doses and she would likely do well in the outpatient setting as she continues these doses.  She was noted to be hypokalemic on admission as well and this has also improved with supplementation and she will remain  on supplementation as prescribed at home.  No other acute events noted throughout this brief admission.  She will need close follow-up with PCP and cardiology and obtain repeat BMP in 1 week.  Discharge Diagnoses:  Active Problems:   Peripheral edema  Principal discharge diagnosis: Volume  overload in the setting of recent decrease in diuretic dose.  Hypokalemia.  Discharge Instructions  Discharge Instructions    Ambulatory referral to Cardiology   Complete by: As directed    CHF/volume management follow up.   Diet - low sodium heart healthy   Complete by: As directed    Increase activity slowly   Complete by: As directed      Allergies as of 02/04/2020      Reactions   Entresto [sacubitril-valsartan] Swelling   Swelling of eyes   Acetaminophen Other (See Comments)   Liver problems   Oxycodone Itching, Nausea Only   Ace Inhibitors Other (See Comments)   UNSURE OF REACTION TYPE   Cefaclor Itching, Rash   Cephalexin Itching, Rash   Penicillins Rash   Has patient had a PCN reaction causing immediate rash, facial/tongue/throat swelling, SOB or lightheadedness with hypotension: YES Has patient had a PCN reaction causing severe rash involving mucus membranes or skin necrosis: NO Has patient had a PCN reaction that required hospitalization: YES Has patient had a PCN reaction occurring within the last 10 years: NO If all of the above answers are "NO", then may proceed with Cephalosporin use.   Pregabalin Other (See Comments)   Retains fluid   Tape Itching, Rash   Takes skin right off with medical tape--PAPER TAPE ONLY      Medication List    STOP taking these medications   atorvastatin 20 MG tablet Commonly known as: LIPITOR   isosorbide mononitrate 60 MG 24 hr tablet Commonly known as: IMDUR     TAKE these medications   albuterol 108 (90 Base) MCG/ACT inhaler Commonly known as: VENTOLIN HFA Inhale 2 puffs into the lungs every 6 (six) hours as needed for wheezing or shortness of breath.   ezetimibe 10 MG tablet Commonly known as: ZETIA Take 10 mg by mouth at bedtime.   FLUoxetine 20 MG capsule Commonly known as: PROZAC Take 20 mg by mouth daily.   hydrocortisone 25 MG suppository Commonly known as: ANUSOL-HC Place 1 suppository (25 mg total)  rectally 2 (two) times daily.   ipratropium 0.03 % nasal spray Commonly known as: ATROVENT Place 1 spray into both nostrils daily as needed (allergies).   metoprolol succinate 25 MG 24 hr tablet Commonly known as: TOPROL-XL Take 0.5 tablets (12.5 mg total) by mouth daily.   Nitrostat 0.4 MG SL tablet Generic drug: nitroGLYCERIN Place 0.4 mg under the tongue every 5 (five) minutes as needed for chest pain (x 3 tabs daily).   ondansetron 4 MG disintegrating tablet Commonly known as: Zofran ODT Take 1 tablet (4 mg total) by mouth every 8 (eight) hours as needed for nausea or vomiting.   Oxycodone HCl 10 MG Tabs Take 10 mg by mouth 4 (four) times daily as needed (pain).   pantoprazole 40 MG tablet Commonly known as: PROTONIX Take 40 mg by mouth daily.   polyethylene glycol 17 g packet Commonly known as: MIRALAX / GLYCOLAX Take 17 g by mouth daily as needed for mild constipation.   potassium chloride 10 MEQ tablet Commonly known as: KLOR-CON Take 1 tablet (10 mEq total) by mouth 2 (two) times daily.   spironolactone 25 MG tablet Commonly  known as: ALDACTONE Take 1 tablet (25 mg total) by mouth daily.   torsemide 20 MG tablet Commonly known as: DEMADEX Take 2 tablets (40 mg total) by mouth daily.       Follow-up Information    Sasser, Silvestre Moment, MD Follow up in 1 week(s).   Specialty: Family Medicine Contact information: [place] [address] [phone]        Sherren Mocha, MD. Schedule an appointment as soon as possible for a visit in 1 week(s).   Specialty: Cardiology Contact information: [address] [place] [address] [phone]              Allergies  Allergen Reactions  . Entresto [Sacubitril-Valsartan] Swelling    Swelling of eyes  . Acetaminophen Other (See Comments)    Liver problems  . Oxycodone Itching and Nausea Only  . Ace Inhibitors Other (See Comments)    UNSURE OF REACTION TYPE   . Cefaclor  Itching and Rash  . Cephalexin Itching and Rash  . Penicillins Rash    Has patient had a PCN reaction causing immediate rash, facial/tongue/throat swelling, SOB or lightheadedness with hypotension: YES Has patient had a PCN reaction causing severe rash involving mucus membranes or skin necrosis: NO Has patient had a PCN reaction that required hospitalization: YES Has patient had a PCN reaction occurring within the last 10 years: NO If all of the above answers are "NO", then may proceed with Cephalosporin use.   . Pregabalin Other (See Comments)    Retains fluid  . Tape Itching and Rash    Takes skin right off with medical tape--PAPER TAPE ONLY    Consultations:  None   Procedures/Studies: CT ABDOMEN PELVIS W CONTRAST  Result Date: 01/24/2020 CLINICAL DATA:  Abdominal pain and fever. EXAM: CT ABDOMEN AND PELVIS WITH CONTRAST TECHNIQUE: Multidetector CT imaging of the abdomen and pelvis was performed using the standard protocol following bolus administration of intravenous contrast. CONTRAST:  2m OMNIPAQUE IOHEXOL 300 MG/ML  SOLN COMPARISON:  CT angiography of the abdomen pelvis 05/23/2019 FINDINGS: Lower chest: Subsegmental atelectasis versus scar noted within the right middle lobe and both posterior lower lobes.6 mm ground-glass nodule is identified within the left lower lobe. This is new when compared with 05/23/2019. Hepatobiliary: Advanced changes of cirrhosis. The liver has a shrunken and nodular appearance. No focal liver abnormality identified. Gallbladder is unremarkable. Tiny stones within the dependent portion of the gallbladder suspected. No signs of biliary ductal dilatation or acute cholecystitis. Pancreas: Unremarkable. No pancreatic ductal dilatation or surrounding inflammatory changes. Spleen: Enlarged measuring 17.5 by 11.3 x 6.5 cm (volume = 670 cm^3) Adrenals/Urinary Tract: Normal appearance of the adrenal glands. No kidney mass or hydronephrosis identified. Urinary  bladder is unremarkable. Stomach/Bowel: Stomach appears nondistended. The appendix is visualized and appears normal. No small bowel wall thickening, inflammation or distension. There is mild wall thickening involving the ascending colon, nonspecific in the setting of cirrhosis, ascites and portal venous hypertension. Sigmoid diverticulosis noted without acute inflammation. Vascular/Lymphatic: Aortic atherosclerosis. No aneurysm. Distal esophageal varices noted. There also large gastric and umbilical varices. No abdominal no pelvic adenopathy. Varices. Reproductive: Status post hysterectomy. No adnexal masses. Other: New upper abdominal ascites overlying the spleen and liver. No discrete fluid collections identified. Musculoskeletal: Previous right hip arthroplasty. Status post vertebral augmentation at L1. Interbody spacers are noted at L4-5 and L5-S1. IMPRESSION: 1. No acute findings identified within the abdomen or pelvis. 2. Advanced changes of cirrhosis and portal  venous hypertension. New upper abdominal ascites. 3. New 6 mm ground-glass nodule identified within the left lower lobe. Likely postinflammatory Initial follow-up with CT at 6-12 months is recommended to confirm persistence. If persistent, repeat CT is recommended every 2 years until 5 years of stability has been established. This recommendation follows the consensus statement: Guidelines for Management of Incidental Pulmonary Nodules Detected on CT Images: From the Fleischner Society 2017; Radiology 2017; 284:228-243. 4. Aortic atherosclerosis. Aortic Atherosclerosis (ICD10-I70.0). Electronically Signed   By: Kerby Moors M.D.   On: 01/24/2020 14:14   US Venous Img Lower Bilateral (DVT)  Result Date: 01/24/2020 CLINICAL DATA:  Left leg swelling. EXAM: BILATERAL LOWER EXTREMITY VENOUS DOPPLER ULTRASOUND TECHNIQUE: Gray-scale sonography with graded compression, as well as color Doppler and duplex ultrasound were performed to evaluate the lower  extremity deep venous systems from the level of the common femoral vein and including the common femoral, femoral, profunda femoral, popliteal and calf veins including the posterior tibial, peroneal and gastrocnemius veins when visible. The superficial great saphenous vein was also interrogated. Spectral Doppler was utilized to evaluate flow at rest and with distal augmentation maneuvers in the common femoral, femoral and popliteal veins. COMPARISON:  None. FINDINGS: RIGHT LOWER EXTREMITY Common Femoral Vein: No evidence of thrombus. Normal compressibility, respiratory phasicity and response to augmentation. Saphenofemoral Junction: No evidence of thrombus. Normal compressibility and flow on color Doppler imaging. Profunda Femoral Vein: No evidence of thrombus. Normal compressibility and flow on color Doppler imaging. Femoral Vein: No evidence of thrombus. Normal compressibility, respiratory phasicity and response to augmentation. Popliteal Vein: No evidence of thrombus. Normal compressibility, respiratory phasicity and response to augmentation. Calf Veins: Visualized right deep calf veins are patent without thrombus. Other Findings:  None. LEFT LOWER EXTREMITY Common Femoral Vein: No evidence of thrombus. Normal compressibility, respiratory phasicity and response to augmentation. Saphenofemoral Junction: No evidence of thrombus. Normal compressibility and flow on color Doppler imaging. Profunda Femoral Vein: No evidence of thrombus. Normal compressibility and flow on color Doppler imaging. Femoral Vein: No evidence of thrombus. Normal compressibility, respiratory phasicity and response to augmentation. Popliteal Vein: No evidence of thrombus. Normal compressibility, respiratory phasicity and response to augmentation. Calf Veins: Visualized left deep calf veins are patent without thrombus. Other Findings:  None. IMPRESSION: No evidence of deep venous thrombosis in either lower extremity. Electronically Signed   By:  Markus Daft M.D.   On: 01/24/2020 10:54   DG Chest Portable 1 View  Result Date: 02/02/2020 CLINICAL DATA:  Volume overload, lower extremity edema EXAM: PORTABLE CHEST 1 VIEW COMPARISON:  01/23/2020 FINDINGS: Single frontal view of the chest demonstrates stable enlarged cardiac silhouette. Postsurgical changes are seen from median sternotomy. There is chronic central vascular congestion without airspace disease, effusion, or pneumothorax. IMPRESSION: 1. Central vascular congestion without edema. Electronically Signed   By: Randa Ngo M.D.   On: 02/02/2020 23:09   DG Chest Portable 1 View  Result Date: 01/23/2020 CLINICAL DATA:  Chest pain EXAM: PORTABLE CHEST 1 VIEW COMPARISON:  05/23/2019, 04/17/2018, 08/14/2016 CT 05/17/2019 FINDINGS: Post sternotomy changes. Cardiomegaly with aortic atherosclerosis. Chronic interstitial thickening. No consolidation or effusion. No pneumothorax. IMPRESSION: Cardiomegaly without overt pulmonary edema, pleural effusion or focal airspace disease. Electronically Signed   By: Donavan Foil M.D.   On: 01/23/2020 19:38   ECHOCARDIOGRAM COMPLETE  Result Date: 01/24/2020    ECHOCARDIOGRAM REPORT   Patient Name:   Samantha [name] Date of Exam: 01/24/2020 Medical Rec #:  [id number]  Height:       62.0 in Accession #:    [id number]     Weight:       172.6 lb Date of Birth:  Aug 14, 1950      BSA:          1.796 m Patient Age:    72 years       BP:           107/59 mmHg Patient Gender: F              HR:           94 bpm. Exam Location:  [facility] Procedure: 2D Echo, Cardiac Doppler and Color Doppler Indications:    Chest Pain 786.50 / R07.9  History:        Patient has prior history of Echocardiogram examinations. CHF,                 CAD, Prior CABG, Arrythmias:Atrial Fibrillation; Risk                 Factors:Hypertension and Dyslipidemia. GERD, Obesity.  Sonographer:    Alvino Chapel RCS Referring Phys: [phone] DAVID TAT IMPRESSIONS  1. Left ventricular ejection fraction, by  estimation, is 35 to 40%. The left ventricle has moderately decreased function. The left ventricle demonstrates global hypokinesis. Left ventricular diastolic function could not be evaluated. There is severe hypokinesis of the left ventricular, entire inferior wall and inferolateral wall.  2. Right ventricular systolic function is mildly reduced. The right ventricular size is normal. There is mildly elevated pulmonary artery systolic pressure.  3. Left atrial size was severely dilated.  4. Right atrial size was severely dilated.  5. The mitral valve is normal in structure. Mild to moderate mitral valve regurgitation.  6. Tricuspid valve regurgitation is moderate.  7. The aortic valve is tricuspid. Aortic valve regurgitation is not visualized. Mild aortic valve sclerosis is present, with no evidence of aortic valve stenosis.  8. The inferior vena cava is normal in size with <50% respiratory variability, suggesting right atrial pressure of 8 mmHg. Comparison(s): A prior study was performed on 05/20/2019. The left ventricular function is worsened. The left ventricular wall motion abnormalities are worse. FINDINGS  Left Ventricle: Left ventricular ejection fraction, by estimation, is 35 to 40%. The left ventricle has moderately decreased function. The left ventricle demonstrates global hypokinesis. Severe hypokinesis of the left ventricular, entire inferior wall and inferolateral wall. The left ventricular internal cavity size was normal in size. There is no left ventricular hypertrophy. Abnormal (paradoxical) septal motion consistent with post-operative status. Left ventricular diastolic function could not be evaluated due to atrial fibrillation. Left ventricular diastolic function could not be evaluated. Right Ventricle: The right ventricular size is normal. No increase in right ventricular wall thickness. Right ventricular systolic function is mildly reduced. There is mildly elevated pulmonary artery systolic  pressure. The tricuspid regurgitant velocity  is 2.75 m/s, and with an assumed right atrial pressure of 8 mmHg, the estimated right ventricular systolic pressure is 25.6 mmHg. Left Atrium: Left atrial size was severely dilated. Right Atrium: Right atrial size was severely dilated. Pericardium: There is no evidence of pericardial effusion. Mitral Valve: The mitral valve is normal in structure. Mild mitral annular calcification. Mild to moderate mitral valve regurgitation. Tricuspid Valve: The tricuspid valve is normal in structure. Tricuspid valve regurgitation is moderate. Aortic Valve: The aortic valve is tricuspid. Aortic valve regurgitation is not visualized. Mild aortic valve sclerosis is present, with no evidence of  aortic valve stenosis. Pulmonic Valve: The pulmonic valve was normal in structure. Pulmonic valve regurgitation is not visualized. Aorta: The aortic root and ascending aorta are structurally normal, with no evidence of dilitation. Venous: The inferior vena cava is normal in size with less than 50% respiratory variability, suggesting right atrial pressure of 8 mmHg. IAS/Shunts: No atrial level shunt detected by color flow Doppler.  LEFT VENTRICLE PLAX 2D LVIDd:         5.00 cm  Diastology LV PW:         1.10 cm  LV e' medial:    9.57 cm/s LV IVS:        1.00 cm  LV E/e' medial:  16.1 LVOT diam:     1.80 cm  LV e' lateral:   14.30 cm/s LV SV:         56       LV E/e' lateral: 10.8 LV SV Index:   31 LVOT Area:     2.54 cm  RIGHT VENTRICLE RV S prime:     9.25 cm/s TAPSE (M-mode): 2.0 cm LEFT ATRIUM             Index       RIGHT ATRIUM           Index LA Vol (A2C):   86.2 ml 48.00 ml/m RA Area:     23.80 cm LA Vol (A4C):   81.9 ml 45.61 ml/m RA Volume:   75.40 ml  41.99 ml/m LA Biplane Vol: 85.3 ml 47.50 ml/m  AORTIC VALVE LVOT Vmax:   99.20 cm/s LVOT Vmean:  64.300 cm/s LVOT VTI:    0.221 m  AORTA Ao Root diam: 4.90 cm MITRAL VALVE                TRICUSPID VALVE MV Area (PHT): 3.97 cm     TR  Peak grad:   30.2 mmHg MV Decel Time: 191 msec     TR Vmax:        275.00 cm/s MV E velocity: 154.00 cm/s                             SHUNTS                             Systemic VTI:  0.22 m                             Systemic Diam: 1.80 cm Dani Gobble Croitoru MD Electronically signed by Sanda Klein MD Signature Date/Time: 01/24/2020/4:45:09 PM    Final    US LIVER DOPPLER  Result Date: 01/27/2020 CLINICAL DATA:  Cirrhosis. EXAM: DUPLEX ULTRASOUND OF LIVER TECHNIQUE: Color and duplex Doppler ultrasound was performed to evaluate the hepatic in-flow and out-flow vessels. COMPARISON:  CT abdomen 01/24/2020 FINDINGS: Liver: Liver is slightly heterogeneous with a nodular contour. Findings are compatible with cirrhosis. No discrete liver lesion. Small amount of perihepatic ascites. Main Portal Vein size: 1.5 cm Portal Vein Velocities Main Prox:  32 cm/sec Main Mid: 26 cm/sec Main Dist:  25 cm/sec Right: 20 cm/sec Left: 80 cm/sec Hepatic Vein Velocities Right:  49 cm/sec Middle:  70 cm/sec Left:  69 cm/sec IVC: Present and patent with normal respiratory phasicity. Hepatic Artery Velocity:  109 cm/sec Splenic Vein Velocity:  24 cm/sec Spleen: 15.8 cm x 15.7 cm x 7.6  cm with a total volume of 991 cm^3 (411 cm^3 is upper limit normal) Portal Vein Occlusion/Thrombus: No Splenic Vein Occlusion/Thrombus: No Ascites: Present Varices: Present Normal hepatofugal flow in the hepatic veins. Normal hepatopetal flow in the portal veins. Enlarged varices at the umbilicus and this corresponds with the previous CT findings. IMPRESSION: 1. 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 2. Portal venous system is patent with normal direction of flow. Electronically Signed   By: Markus Daft M.D.   On: 01/27/2020 13:52     Discharge Exam: Vitals:   02/03/20 2339 02/04/20 0434  BP: (!) 119/42 (!) 121/47  Pulse: 82 84  Resp: 18 20  Temp: 98.4 F (36.9 C) 98.1 F (36.7 C)  SpO2: 92% 94%    Vitals:   02/03/20 2023 02/03/20 2026 02/03/20 2339 02/04/20 0434  BP: (!) 93/50 (!) 109/49 (!) 119/42 (!) 121/47  Pulse: 86 84 82 84  Resp: 20 20 18 20   Temp: 98.5 F (36.9 C) 98.5 F (36.9 C) 98.4 F (36.9 C) 98.1 F (36.7 C)  TempSrc:  Oral Oral Oral  SpO2: 93% 94% 92% 94%  Weight:    82.3 kg  Height:        General: Pt is alert, awake, not in acute distress Cardiovascular: RRR, S1/S2 +, no rubs, no gallops Respiratory: CTA bilaterally, no wheezing, no rhonchi Abdominal: Soft, NT, ND, bowel sounds + Extremities: Minimal edema to ankles noted.    The results of significant diagnostics from this hospitalization (including imaging, microbiology, ancillary and laboratory) are listed below for reference.     Microbiology: Recent Results (from the past 240 hour(s))  Resp Panel by RT-PCR (Flu A&B, Covid) Nasopharyngeal Swab     Status: None   Collection Time: 02/03/20  1:07 AM   Specimen: Nasopharyngeal Swab; Nasopharyngeal(NP) swabs in vial transport medium  Result Value Ref Range Status   SARS Coronavirus 2 by RT PCR NEGATIVE NEGATIVE Final    Comment: (NOTE) SARS-CoV-2 target nucleic acids are NOT DETECTED.  The SARS-CoV-2 RNA is generally detectable in upper respiratory specimens during the acute phase of infection. The lowest concentration of SARS-CoV-2 viral copies this assay can detect is 138 copies/mL. A negative result does not preclude SARS-Cov-2 infection and should not be used as the sole basis for treatment or other patient management decisions. A negative result may occur with  improper specimen collection/handling, submission of specimen other than nasopharyngeal swab, presence of viral mutation(s) within the areas targeted by this assay, and inadequate number of viral copies(<138 copies/mL). A negative result must be combined with clinical observations, patient history, and epidemiological information. The expected result is Negative.  Fact Sheet for  Patients:  EntrepreneurPulse.com.au  Fact Sheet for Healthcare Providers:  IncredibleEmployment.be  This test is no t yet approved or cleared by the [place] FDA and  has been authorized for detection and/or diagnosis of SARS-CoV-2 by FDA under an Emergency Use Authorization (EUA). This EUA will remain  in effect (meaning this test can be used) for the duration of the COVID-19 declaration under Section 564(b)(1) of the Act, 21 U.S.C.section 360bbb-3(b)(1), unless the authorization is terminated  or revoked sooner.       Influenza A by PCR NEGATIVE NEGATIVE Final   Influenza B by PCR NEGATIVE NEGATIVE Final    Comment: (NOTE) The Xpert Xpress SARS-CoV-2/FLU/RSV plus assay is intended as an aid in the diagnosis of influenza from Nasopharyngeal swab specimens and should not be used as a  sole basis for treatment. Nasal washings and aspirates are unacceptable for Xpert Xpress SARS-CoV-2/FLU/RSV testing.  Fact Sheet for Patients: EntrepreneurPulse.com.au  Fact Sheet for Healthcare Providers: IncredibleEmployment.be  This test is not yet approved or cleared by the [place] FDA and has been authorized for detection and/or diagnosis of SARS-CoV-2 by FDA under an Emergency Use Authorization (EUA). This EUA will remain in effect (meaning this test can be used) for the duration of the COVID-19 declaration under Section 564(b)(1) of the Act, 21 U.S.C. section 360bbb-3(b)(1), unless the authorization is terminated or revoked.  Performed at [facility], [address], [place], [place] [address]      Labs: BNP (last 3 results) Recent Labs    05/23/19 0550 01/23/20 1821 02/02/20 2347  BNP 134.0* 175.0* 196.2*   Basic Metabolic Panel: Recent Labs  Lab 02/02/20 2347 02/03/20 1111  NA 134* 137  K 2.7* 3.6  CL 95* 98  CO2 30 31  GLUCOSE 105* 87  BUN 11 12  CREATININE 1.18* 1.05*  CALCIUM 8.1*  8.2*   Liver Function Tests: Recent Labs  Lab 02/02/20 2347  AST 57*  ALT 23  ALKPHOS 107  BILITOT 4.3*  PROT 5.7*  ALBUMIN 2.3*   No results for input(s): LIPASE, AMYLASE in the last 168 hours. No results for input(s): AMMONIA in the last 168 hours. CBC: Recent Labs  Lab 02/02/20 2347  WBC 3.8*  NEUTROABS 2.2  HGB 10.9*  HCT 32.4*  MCV 94.5  PLT 43*   Cardiac Enzymes: No results for input(s): CKTOTAL, CKMB, CKMBINDEX, TROPONINI in the last 168 hours. BNP: Invalid input(s): POCBNP CBG: No results for input(s): GLUCAP in the last 168 hours. D-Dimer No results for input(s): DDIMER in the last 72 hours. Hgb A1c No results for input(s): HGBA1C in the last 72 hours. Lipid Profile No results for input(s): CHOL, HDL, LDLCALC, TRIG, CHOLHDL, LDLDIRECT in the last 72 hours. Thyroid function studies No results for input(s): TSH, T4TOTAL, T3FREE, THYROIDAB in the last 72 hours.  Invalid input(s): FREET3 Anemia work up No results for input(s): VITAMINB12, FOLATE, FERRITIN, TIBC, IRON, RETICCTPCT in the last 72 hours. Urinalysis    Component Value Date/Time   COLORURINE YELLOW 05/19/2019 2015   APPEARANCEUR HAZY (A) 05/19/2019 2015   LABSPEC 1.013 05/19/2019 2015   PHURINE 5.0 05/19/2019 2015   GLUCOSEU NEGATIVE 05/19/2019 2015   HGBUR NEGATIVE 05/19/2019 2015   [place] NEGATIVE 05/19/2019 2015   KETONESUR NEGATIVE 05/19/2019 2015   PROTEINUR NEGATIVE 05/19/2019 2015   NITRITE NEGATIVE 05/19/2019 2015   LEUKOCYTESUR NEGATIVE 05/19/2019 2015   Sepsis Labs Invalid input(s): PROCALCITONIN,  WBC,  LACTICIDVEN Microbiology Recent Results (from the past 240 hour(s))  Resp Panel by RT-PCR (Flu A&B, Covid) Nasopharyngeal Swab     Status: None   Collection Time: 02/03/20  1:07 AM   Specimen: Nasopharyngeal Swab; Nasopharyngeal(NP) swabs in vial transport medium  Result Value Ref Range Status   SARS Coronavirus 2 by RT PCR NEGATIVE NEGATIVE Final    Comment:  (NOTE) SARS-CoV-2 target nucleic acids are NOT DETECTED.  The SARS-CoV-2 RNA is generally detectable in upper respiratory specimens during the acute phase of infection. The lowest concentration of SARS-CoV-2 viral copies this assay can detect is 138 copies/mL. A negative result does not preclude SARS-Cov-2 infection and should not be used as the sole basis for treatment or other patient management decisions. A negative result may occur with  improper specimen collection/handling, submission of specimen other than nasopharyngeal swab, presence of viral  mutation(s) within the areas targeted by this assay, and inadequate number of viral copies(<138 copies/mL). A negative result must be combined with clinical observations, patient history, and epidemiological information. The expected result is Negative.  Fact Sheet for Patients:  EntrepreneurPulse.com.au  Fact Sheet for Healthcare Providers:  IncredibleEmployment.be  This test is no t yet approved or cleared by the [place] FDA and  has been authorized for detection and/or diagnosis of SARS-CoV-2 by FDA under an Emergency Use Authorization (EUA). This EUA will remain  in effect (meaning this test can be used) for the duration of the COVID-19 declaration under Section 564(b)(1) of the Act, 21 U.S.C.section 360bbb-3(b)(1), unless the authorization is terminated  or revoked sooner.       Influenza A by PCR NEGATIVE NEGATIVE Final   Influenza B by PCR NEGATIVE NEGATIVE Final    Comment: (NOTE) The Xpert Xpress SARS-CoV-2/FLU/RSV plus assay is intended as an aid in the diagnosis of influenza from Nasopharyngeal swab specimens and should not be used as a sole basis for treatment. Nasal washings and aspirates are unacceptable for Xpert Xpress SARS-CoV-2/FLU/RSV testing.  Fact Sheet for Patients: EntrepreneurPulse.com.au  Fact Sheet for Healthcare  Providers: IncredibleEmployment.be  This test is not yet approved or cleared by the [place] FDA and has been authorized for detection and/or diagnosis of SARS-CoV-2 by FDA under an Emergency Use Authorization (EUA). This EUA will remain in effect (meaning this test can be used) for the duration of the COVID-19 declaration under Section 564(b)(1) of the Act, 21 U.S.C. section 360bbb-3(b)(1), unless the authorization is terminated or revoked.  Performed at [facility], [address], [place], [place] [address]      Time coordinating discharge: 35 minutes  SIGNED:   Rodena Goldmann, DO [facility] 02/04/2020, 9:01 AM  If 7PM-7AM, please contact night-coverage [email or web address]

## 2020-02-04 NOTE — Plan of Care (Signed)
  Problem: Education: Goal: Knowledge of General Education information will improve Description: Including pain rating scale, medication(s)/side effects and non-pharmacologic comfort measures 02/04/2020 0958 by Cameron Ali, RN Outcome: Completed/Met 02/04/2020 0938 by Cameron Ali, RN Outcome: Progressing   Problem: Health Behavior/Discharge Planning: Goal: Ability to manage health-related needs will improve 02/04/2020 0958 by Cameron Ali, RN Outcome: Completed/Met 02/04/2020 0938 by Cameron Ali, RN Outcome: Progressing   Problem: Clinical Measurements: Goal: Ability to maintain clinical measurements within normal limits will improve 02/04/2020 0958 by Cameron Ali, RN Outcome: Completed/Met 02/04/2020 0938 by Cameron Ali, RN Outcome: Progressing Goal: Will remain free from infection 02/04/2020 0958 by Cameron Ali, RN Outcome: Completed/Met 02/04/2020 0938 by Cameron Ali, RN Outcome: Progressing Goal: Diagnostic test results will improve 02/04/2020 0958 by Cameron Ali, RN Outcome: Completed/Met 02/04/2020 0938 by Cameron Ali, RN Outcome: Progressing Goal: Respiratory complications will improve 02/04/2020 0958 by Cameron Ali, RN Outcome: Completed/Met 02/04/2020 0938 by Cameron Ali, RN Outcome: Progressing Goal: Cardiovascular complication will be avoided 02/04/2020 0958 by Cameron Ali, RN Outcome: Completed/Met 02/04/2020 0938 by Cameron Ali, RN Outcome: Progressing   Problem: Activity: Goal: Risk for activity intolerance will decrease 02/04/2020 0958 by Cameron Ali, RN Outcome: Completed/Met 02/04/2020 0938 by Cameron Ali, RN Outcome: Progressing   Problem: Nutrition: Goal: Adequate nutrition will be maintained 02/04/2020 0958 by Cameron Ali, RN Outcome: Completed/Met 02/04/2020 0938 by Cameron Ali, RN Outcome: Progressing   Problem: Coping: Goal: Level of anxiety will  decrease 02/04/2020 0958 by Cameron Ali, RN Outcome: Completed/Met 02/04/2020 0938 by Cameron Ali, RN Outcome: Progressing   Problem: Elimination: Goal: Will not experience complications related to bowel motility 02/04/2020 0958 by Cameron Ali, RN Outcome: Completed/Met 02/04/2020 0938 by Cameron Ali, RN Outcome: Progressing Goal: Will not experience complications related to urinary retention 02/04/2020 0958 by Cameron Ali, RN Outcome: Completed/Met 02/04/2020 0938 by Cameron Ali, RN Outcome: Progressing   Problem: Pain Managment: Goal: General experience of comfort will improve 02/04/2020 0958 by Cameron Ali, RN Outcome: Completed/Met 02/04/2020 0938 by Cameron Ali, RN Outcome: Progressing   Problem: Safety: Goal: Ability to remain free from injury will improve 02/04/2020 0958 by Cameron Ali, RN Outcome: Completed/Met 02/04/2020 0938 by Cameron Ali, RN Outcome: Progressing   Problem: Skin Integrity: Goal: Risk for impaired skin integrity will decrease 02/04/2020 0958 by Cameron Ali, RN Outcome: Completed/Met 02/04/2020 0938 by Cameron Ali, RN Outcome: Progressing   Problem: Education: Goal: Ability to demonstrate management of disease process will improve 02/04/2020 0958 by Cameron Ali, RN Outcome: Completed/Met 02/04/2020 0938 by Cameron Ali, RN Outcome: Progressing Goal: Ability to verbalize understanding of medication therapies will improve 02/04/2020 0958 by Cameron Ali, RN Outcome: Completed/Met 02/04/2020 0938 by Cameron Ali, RN Outcome: Progressing Goal: Individualized Educational Video(s) 02/04/2020 0958 by Cameron Ali, RN Outcome: Completed/Met 02/04/2020 0938 by Cameron Ali, RN Outcome: Progressing   Problem: Activity: Goal: Capacity to carry out activities will improve 02/04/2020 0958 by Cameron Ali, RN Outcome: Completed/Met 02/04/2020 0938 by Cameron Ali, RN Outcome: Progressing   Problem: Cardiac: Goal: Ability to achieve and maintain adequate cardiopulmonary perfusion will improve 02/04/2020 0958 by Cameron Ali, RN Outcome: Completed/Met 02/04/2020 0938 by Cameron Ali, RN Outcome: Progressing

## 2020-02-08 DIAGNOSIS — I8500 Esophageal varices without bleeding: Secondary | ICD-10-CM | POA: Diagnosis not present

## 2020-02-08 DIAGNOSIS — R609 Edema, unspecified: Secondary | ICD-10-CM | POA: Diagnosis not present

## 2020-02-08 DIAGNOSIS — J438 Other emphysema: Secondary | ICD-10-CM | POA: Diagnosis not present

## 2020-02-08 DIAGNOSIS — K7460 Unspecified cirrhosis of liver: Secondary | ICD-10-CM | POA: Diagnosis not present

## 2020-02-08 DIAGNOSIS — R0600 Dyspnea, unspecified: Secondary | ICD-10-CM | POA: Diagnosis not present

## 2020-02-11 ENCOUNTER — Ambulatory Visit (INDEPENDENT_AMBULATORY_CARE_PROVIDER_SITE_OTHER): Payer: Medicare Other | Admitting: Internal Medicine

## 2020-02-11 ENCOUNTER — Other Ambulatory Visit: Payer: Self-pay

## 2020-02-11 ENCOUNTER — Encounter (INDEPENDENT_AMBULATORY_CARE_PROVIDER_SITE_OTHER): Payer: Self-pay | Admitting: Internal Medicine

## 2020-02-11 VITALS — BP 113/66 | HR 93 | Temp 97.9°F | Ht 63.0 in | Wt 183.3 lb

## 2020-02-11 DIAGNOSIS — K5900 Constipation, unspecified: Secondary | ICD-10-CM

## 2020-02-11 DIAGNOSIS — K7460 Unspecified cirrhosis of liver: Secondary | ICD-10-CM

## 2020-02-11 MED ORDER — POLYETHYLENE GLYCOL 3350 17 G PO PACK
25.5000 g | PACK | Freq: Every day | ORAL | 5 refills | Status: DC
Start: 2020-02-11 — End: 2020-03-12

## 2020-02-11 MED ORDER — FUROSEMIDE 40 MG PO TABS: 40.0000 mg | ORAL_TABLET | Freq: Two times a day (BID) | ORAL | 1 refills | Status: DC

## 2020-02-11 MED ORDER — METAMUCIL SMOOTH TEXTURE 58.6 % PO POWD: 1.0000 | Freq: Every day | ORAL | 12 refills | Status: DC

## 2020-02-11 NOTE — Patient Instructions (Signed)
If you do not see any benefit with furosemide please go to emergency room. Take Metamucil every night. Polyethylene glycol 1-1/2 scoop daily.  Can drop dose back to once daily if 1-1/2 scoop is too much.

## 2020-02-11 NOTE — Progress Notes (Signed)
Presenting complaint;  Follow-up for cirrhosis. Patient complains of worsening lower extremity edema.  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 She was last seen in the office on 08/06/2019.  She was 879 pounds on that visit. Patient was admitted to [facility] on 01/24/2020 nausea vomiting and dehydration.  Her condition resolved with IV fluids and supportive therapy.  Stool studies were recommended but not performed.  She was discharged on 01/28/2020.  She was readmitted on 02/02/2020 for worsening lower extremity edema not responding to outpatient therapy.  She was discharged 2 days later. She tells me she weighed 172 pounds when she was discharged from the hospital.  Patient says she is not having nausea or vomiting anymore but she had noted progressive lower extremity edema.  She says she has gained 9 or 10 pounds.  However she weighed 181 pounds during one of her admissions.  She says she is having difficulty walking because her legs are stiff.  She is not having shortness of breath or chest pain.  She also denies abdominal distention or pain.  She also complains of constipation.  She is noting bright red blood with bowel movements.  She describes it to be small.  She has chronic low back pain and takes oxycodone but not every day.  She also complains of insomnia.  She says heartburn is well controlled with therapy.  She says she has had lower extremity edema for years and furosemide worked until recently.  She feels Demadex is not working either.  She saw Dr. Quintin Alto 4 days ago and spironolactone was added but so far she cannot tell any difference.  Presently she is staying with her sister.  Current Medications: Outpatient Encounter Medications as of 02/11/2020  Medication Sig  .  albuterol (PROVENTIL HFA;VENTOLIN HFA) 108 (90 BASE) MCG/ACT inhaler Inhale 2 puffs into the lungs every 6 (six) hours as needed for wheezing or shortness of breath.   . ezetimibe (ZETIA) 10 MG tablet Take 10 mg by mouth at bedtime.  [name] FLUoxetine (PROZAC) 20 MG capsule Take 20 mg by mouth daily.   . hydrocortisone (ANUSOL-HC) 25 MG suppository Place 1 suppository (25 mg total) rectally 2 (two) times daily.  [name] ipratropium (ATROVENT) 0.03 % nasal spray Place 1 spray into both nostrils daily as needed (allergies).   . isosorbide mononitrate (IMDUR) 60 MG 24 hr tablet Take 60 mg by mouth daily.  . metoprolol succinate (TOPROL-XL) 25 MG 24 hr tablet Take 0.5 tablets (12.5 mg total) by mouth daily.  [name] NITROSTAT 0.4 MG SL tablet Place 0.4 mg under the tongue every 5 (five) minutes as needed for chest pain (x 3 tabs daily).   . ondansetron (ZOFRAN ODT) 4 MG disintegrating tablet Take 1 tablet (4 mg total) by mouth every 8 (eight) hours as needed for nausea or vomiting.  . Oxycodone HCl 10 MG TABS Take 10 mg by mouth 4 (four) times daily as needed (pain).   . pantoprazole (PROTONIX) 40 MG tablet Take 40 mg by mouth daily.  . polyethylene glycol (MIRALAX / GLYCOLAX) packet Take 17 g by mouth daily as needed for mild constipation.   . potassium chloride (KLOR-CON) 10 MEQ tablet Take 1 tablet (10 mEq total) by mouth 2 (two) times daily.  [name] spironolactone (ALDACTONE) 25 MG tablet Take 1 tablet (25 mg total)  by mouth daily.  [name] torsemide (DEMADEX) 20 MG tablet Take 2 tablets (40 mg total) by mouth daily.   No facility-administered encounter medications on file as of 02/11/2020.     Objective: Blood pressure 113/66, pulse 93, temperature 97.9 F (36.6 C), temperature source Oral, height _0  (1.6 m), weight 183 lb 4.8 oz (83.1 kg), last menstrual period 03/29/2011. Patient is alert and in no acute distress. She does not have asterixis. She is wearing a mask. Conjunctiva is pink. Sclera is  nonicteric Oropharyngeal mucosa is normal. She has upper and lower dentures in place. No neck masses or thyromegaly noted. Cardiac exam with irregular rhythm normal S1 and S2.  She has grade 2/6 systolic ejection murmur best heard aortic area. Lungs are clear to auscultation. Abdomen is full but not tense or tender.  Examination is limited because was performed in sitting position. She has 3-4+ pitting edema involving both feet and legs up to the level of knees.  She has 1-2+ pitting edema above the level of knees.  There is no skin breakdown.  Labs/studies Results:  CBC Latest Ref Rng & Units 02/02/2020 01/28/2020 01/27/2020  WBC 4.0 - 10.5 K/uL 3.8(L) 3.4(L) 3.1(L)  Hemoglobin 12.0 - 15.0 g/dL 10.9(L) 11.6(L) 11.8(L)  Hematocrit 36.0 - 46.0 % 32.4(L) 35.2(L) 36.5  Platelets 150 - 400 K/uL 43(L) 38(L) 38(L)    CMP Latest Ref Rng & Units 02/03/2020 02/02/2020 01/28/2020  Glucose 70 - 99 mg/dL 87 105(H) 92  BUN 8 - 23 mg/dL _1 Creatinine 0.44 - 1.00 mg/dL 1.05(H) 1.18(H) 1.06(H)  Sodium 135 - 145 mmol/L 137 134(L) 136  Potassium 3.5 - 5.1 mmol/L 3.6 2.7(LL) 3.4(L)  Chloride 98 - 111 mmol/L 98 95(L) 100  CO2 22 - 32 mmol/L _2 Calcium 8.9 - 10.3 mg/dL 8.2(L) 8.1(L) 8.5(L)  Total Protein 6.5 - 8.1 g/dL - 5.7(L) 5.5(L)  Total Bilirubin 0.3 - 1.2 mg/dL - 4.3(H) 3.9(H)  Alkaline Phos 38 - 126 U/L - 107 68  AST 15 - 41 U/L - 57(H) 46(H)  ALT 0 - 44 U/L - 23 20    Hepatic Function Latest Ref Rng & Units 02/02/2020 01/28/2020 01/27/2020  Total Protein 6.5 - 8.1 g/dL 5.7(L) 5.5(L) 5.6(L)  Albumin 3.5 - 5.0 g/dL 2.3(L) 2.3(L) 2.2(L)  AST 15 - 41 U/L 57(H) 46(H) 49(H)  ALT 0 - 44 U/L _3 Alk Phosphatase 38 - 126 U/L 107 68 64  Total Bilirubin 0.3 - 1.2 mg/dL 4.3(H) 3.9(H) 4.0(H)  Bilirubin, Direct 0.0 - 0.2 mg/dL - - -    CT from 01/24/2020 reviewed. It revealed small amount of perihepatic ascites.  Liver Doppler on 01/27/2020 once again revealed normal hepatofugal flow  in the hepatic veins.  Varices noted in the region of umbilicus, splenomegaly and perihepatic ascites.  Assessment:  #1.  Massive lower extremity edema.  Patient is not responding to oral diuretic therapy.  She is at risk for skin breakdown and cellulitis.  Lower extremity edema appears to be multifactorial but not primarily due to cirrhosis as she has minimal ascites.  She does have cardiomyopathy.  She may also have venous incompetence in lower extremities and she also had profound hypoalbuminemia.  I doubt that she would respond to outpatient therapy but she wants to try furosemide before considering hospitalization. We also need to rule out thrombosis.  #2.  Cirrhosis secondary to NASH.    #3.  Anemia appears to be chronic.  #4.  Hematochezia secondary to hemorrhoids.   Plan:  Discontinue Demadex. Begin furosemide at a dose of 40 mg p.o. twice daily. Continue spironolactone as before. Begin Metamucil 4 to 6 g daily at bedtime. Polyethylene glycol 1-1/2 scoop daily.  Can titrate dose downwards if stool is loose. Patient advised to go to emergency room if she does not experience diureses and weight loss. She is scheduled to have blood work within the next 2 weeks as recommended by Dr. Quintin Alto. Office visit in 3 months.

## 2020-02-13 ENCOUNTER — Telehealth (INDEPENDENT_AMBULATORY_CARE_PROVIDER_SITE_OTHER): Payer: Self-pay | Admitting: *Deleted

## 2020-02-13 ENCOUNTER — Other Ambulatory Visit: Payer: Self-pay

## 2020-02-13 ENCOUNTER — Emergency Department (HOSPITAL_COMMUNITY): Payer: Medicare Other

## 2020-02-13 ENCOUNTER — Encounter (HOSPITAL_COMMUNITY): Payer: Self-pay | Admitting: *Deleted

## 2020-02-13 ENCOUNTER — Observation Stay (HOSPITAL_COMMUNITY): Payer: Medicare Other

## 2020-02-13 ENCOUNTER — Inpatient Hospital Stay (HOSPITAL_COMMUNITY)
Admission: EM | Admit: 2020-02-13 | Discharge: 2020-02-19 | DRG: 291 | Disposition: A | Discharge status: Home Health | Payer: Medicare Other | Attending: Family Medicine | Admitting: Family Medicine

## 2020-02-13 DIAGNOSIS — R188 Other ascites: Secondary | ICD-10-CM | POA: Diagnosis not present

## 2020-02-13 DIAGNOSIS — R601 Generalized edema: Secondary | ICD-10-CM

## 2020-02-13 DIAGNOSIS — E8770 Fluid overload, unspecified: Secondary | ICD-10-CM | POA: Diagnosis not present

## 2020-02-13 DIAGNOSIS — Z961 Presence of intraocular lens: Secondary | ICD-10-CM | POA: Diagnosis not present

## 2020-02-13 DIAGNOSIS — Y95 Nosocomial condition: Secondary | ICD-10-CM | POA: Diagnosis not present

## 2020-02-13 DIAGNOSIS — R0600 Dyspnea, unspecified: Secondary | ICD-10-CM | POA: Diagnosis not present

## 2020-02-13 DIAGNOSIS — Z8249 Family history of ischemic heart disease and other diseases of the circulatory system: Secondary | ICD-10-CM

## 2020-02-13 DIAGNOSIS — K7290 Hepatic failure, unspecified without coma: Secondary | ICD-10-CM | POA: Diagnosis present

## 2020-02-13 DIAGNOSIS — G8929 Other chronic pain: Secondary | ICD-10-CM | POA: Diagnosis not present

## 2020-02-13 DIAGNOSIS — M7989 Other specified soft tissue disorders: Secondary | ICD-10-CM | POA: Diagnosis not present

## 2020-02-13 DIAGNOSIS — I480 Paroxysmal atrial fibrillation: Secondary | ICD-10-CM

## 2020-02-13 DIAGNOSIS — R600 Localized edema: Secondary | ICD-10-CM | POA: Diagnosis not present

## 2020-02-13 DIAGNOSIS — W1830XA Fall on same level, unspecified, initial encounter: Secondary | ICD-10-CM | POA: Diagnosis present

## 2020-02-13 DIAGNOSIS — I5042 Chronic combined systolic (congestive) and diastolic (congestive) heart failure: Secondary | ICD-10-CM | POA: Diagnosis present

## 2020-02-13 DIAGNOSIS — Z9842 Cataract extraction status, left eye: Secondary | ICD-10-CM

## 2020-02-13 DIAGNOSIS — I517 Cardiomegaly: Secondary | ICD-10-CM | POA: Diagnosis not present

## 2020-02-13 DIAGNOSIS — Z66 Do not resuscitate: Secondary | ICD-10-CM | POA: Diagnosis not present

## 2020-02-13 DIAGNOSIS — Z91048 Other nonmedicinal substance allergy status: Secondary | ICD-10-CM

## 2020-02-13 DIAGNOSIS — Z888 Allergy status to other drugs, medicaments and biological substances status: Secondary | ICD-10-CM

## 2020-02-13 DIAGNOSIS — Z955 Presence of coronary angioplasty implant and graft: Secondary | ICD-10-CM

## 2020-02-13 DIAGNOSIS — E8779 Other fluid overload: Secondary | ICD-10-CM | POA: Diagnosis not present

## 2020-02-13 DIAGNOSIS — J9611 Chronic respiratory failure with hypoxia: Secondary | ICD-10-CM | POA: Diagnosis present

## 2020-02-13 DIAGNOSIS — R0602 Shortness of breath: Secondary | ICD-10-CM | POA: Diagnosis not present

## 2020-02-13 DIAGNOSIS — Z881 Allergy status to other antibiotic agents status: Secondary | ICD-10-CM

## 2020-02-13 DIAGNOSIS — N1831 Chronic kidney disease, stage 3a: Secondary | ICD-10-CM | POA: Diagnosis not present

## 2020-02-13 DIAGNOSIS — R911 Solitary pulmonary nodule: Secondary | ICD-10-CM | POA: Diagnosis not present

## 2020-02-13 DIAGNOSIS — R296 Repeated falls: Secondary | ICD-10-CM | POA: Diagnosis present

## 2020-02-13 DIAGNOSIS — D61818 Other pancytopenia: Secondary | ICD-10-CM | POA: Diagnosis present

## 2020-02-13 DIAGNOSIS — I255 Ischemic cardiomyopathy: Secondary | ICD-10-CM | POA: Diagnosis not present

## 2020-02-13 DIAGNOSIS — E785 Hyperlipidemia, unspecified: Secondary | ICD-10-CM | POA: Diagnosis present

## 2020-02-13 DIAGNOSIS — I8510 Secondary esophageal varices without bleeding: Secondary | ICD-10-CM | POA: Diagnosis not present

## 2020-02-13 DIAGNOSIS — I4819 Other persistent atrial fibrillation: Secondary | ICD-10-CM | POA: Diagnosis present

## 2020-02-13 DIAGNOSIS — Z951 Presence of aortocoronary bypass graft: Secondary | ICD-10-CM

## 2020-02-13 DIAGNOSIS — I2510 Atherosclerotic heart disease of native coronary artery without angina pectoris: Secondary | ICD-10-CM | POA: Diagnosis not present

## 2020-02-13 DIAGNOSIS — R918 Other nonspecific abnormal finding of lung field: Secondary | ICD-10-CM | POA: Diagnosis not present

## 2020-02-13 DIAGNOSIS — Z20822 Contact with and (suspected) exposure to covid-19: Secondary | ICD-10-CM | POA: Diagnosis present

## 2020-02-13 DIAGNOSIS — I5043 Acute on chronic combined systolic (congestive) and diastolic (congestive) heart failure: Secondary | ICD-10-CM | POA: Diagnosis not present

## 2020-02-13 DIAGNOSIS — K7460 Unspecified cirrhosis of liver: Secondary | ICD-10-CM | POA: Diagnosis not present

## 2020-02-13 DIAGNOSIS — R770 Abnormality of albumin: Secondary | ICD-10-CM

## 2020-02-13 DIAGNOSIS — J811 Chronic pulmonary edema: Secondary | ICD-10-CM | POA: Diagnosis not present

## 2020-02-13 DIAGNOSIS — Z885 Allergy status to narcotic agent status: Secondary | ICD-10-CM

## 2020-02-13 DIAGNOSIS — Z880 Allergy status to penicillin: Secondary | ICD-10-CM

## 2020-02-13 DIAGNOSIS — J189 Pneumonia, unspecified organism: Secondary | ICD-10-CM

## 2020-02-13 DIAGNOSIS — J181 Lobar pneumonia, unspecified organism: Secondary | ICD-10-CM | POA: Diagnosis not present

## 2020-02-13 DIAGNOSIS — M545 Low back pain, unspecified: Secondary | ICD-10-CM | POA: Diagnosis not present

## 2020-02-13 DIAGNOSIS — F419 Anxiety disorder, unspecified: Secondary | ICD-10-CM | POA: Diagnosis present

## 2020-02-13 DIAGNOSIS — F32A Depression, unspecified: Secondary | ICD-10-CM | POA: Diagnosis not present

## 2020-02-13 DIAGNOSIS — M48061 Spinal stenosis, lumbar region without neurogenic claudication: Secondary | ICD-10-CM | POA: Diagnosis not present

## 2020-02-13 DIAGNOSIS — I10 Essential (primary) hypertension: Secondary | ICD-10-CM | POA: Diagnosis present

## 2020-02-13 DIAGNOSIS — R000 Tachycardia, unspecified: Secondary | ICD-10-CM | POA: Diagnosis not present

## 2020-02-13 DIAGNOSIS — Z981 Arthrodesis status: Secondary | ICD-10-CM

## 2020-02-13 DIAGNOSIS — Z79899 Other long term (current) drug therapy: Secondary | ICD-10-CM

## 2020-02-13 DIAGNOSIS — Z87891 Personal history of nicotine dependence: Secondary | ICD-10-CM

## 2020-02-13 DIAGNOSIS — I455 Other specified heart block: Secondary | ICD-10-CM | POA: Diagnosis not present

## 2020-02-13 DIAGNOSIS — K219 Gastro-esophageal reflux disease without esophagitis: Secondary | ICD-10-CM | POA: Diagnosis not present

## 2020-02-13 DIAGNOSIS — E876 Hypokalemia: Secondary | ICD-10-CM | POA: Diagnosis not present

## 2020-02-13 DIAGNOSIS — I25119 Atherosclerotic heart disease of native coronary artery with unspecified angina pectoris: Secondary | ICD-10-CM | POA: Diagnosis present

## 2020-02-13 DIAGNOSIS — Z90710 Acquired absence of both cervix and uterus: Secondary | ICD-10-CM

## 2020-02-13 DIAGNOSIS — R609 Edema, unspecified: Secondary | ICD-10-CM

## 2020-02-13 DIAGNOSIS — I130 Hypertensive heart and chronic kidney disease with heart failure and stage 1 through stage 4 chronic kidney disease, or unspecified chronic kidney disease: Principal | ICD-10-CM | POA: Diagnosis present

## 2020-02-13 DIAGNOSIS — J440 Chronic obstructive pulmonary disease with acute lower respiratory infection: Secondary | ICD-10-CM | POA: Diagnosis not present

## 2020-02-13 LAB — COMPREHENSIVE METABOLIC PANEL
ALT: 26 U/L (ref 0–44)
AST: 58 U/L — ABNORMAL HIGH (ref 15–41)
Albumin: 2.7 g/dL — ABNORMAL LOW (ref 3.5–5.0)
Alkaline Phosphatase: 83 U/L (ref 38–126)
Anion gap: 9 (ref 5–15)
BUN: 16 mg/dL (ref 8–23)
CO2: 24 mmol/L (ref 22–32)
Calcium: 8.9 mg/dL (ref 8.9–10.3)
Chloride: 104 mmol/L (ref 98–111)
Creatinine, Ser: 1.04 mg/dL — ABNORMAL HIGH (ref 0.44–1.00)
GFR, Estimated: 57 mL/min — ABNORMAL LOW (ref >60)
Glucose, Bld: 63 mg/dL — ABNORMAL LOW (ref 70–99)
Potassium: 2.8 mmol/L — ABNORMAL LOW (ref 3.5–5.1)
Sodium: 137 mmol/L (ref 135–145)
Total Bilirubin: 5.2 mg/dL — ABNORMAL HIGH (ref 0.3–1.2)
Total Protein: 6.8 g/dL (ref 6.5–8.1)

## 2020-02-13 LAB — CBC
HCT: 36.5 % (ref 36.0–46.0)
Hemoglobin: 12 g/dL (ref 12.0–15.0)
MCH: 32.1 pg (ref 26.0–34.0)
MCHC: 32.9 g/dL (ref 30.0–36.0)
MCV: 97.6 fL (ref 80.0–100.0)
Platelets: 49 10*3/uL — ABNORMAL LOW (ref 150–400)
RBC: 3.74 MIL/uL — ABNORMAL LOW (ref 3.87–5.11)
RDW: 18.2 % — ABNORMAL HIGH (ref 11.5–15.5)
WBC: 5.8 10*3/uL (ref 4.0–10.5)
nRBC: 0 % (ref 0.0–0.2)

## 2020-02-13 LAB — PROTIME-INR
INR: 1.6 — ABNORMAL HIGH (ref 0.8–1.2)
Prothrombin Time: 18.6 seconds — ABNORMAL HIGH (ref 11.4–15.2)

## 2020-02-13 LAB — LIPASE, BLOOD: Lipase: 44 U/L (ref 11–51)

## 2020-02-13 LAB — RESP PANEL BY RT-PCR (FLU A&B, COVID) ARPGX2
Influenza A by PCR: NEGATIVE
Influenza B by PCR: NEGATIVE
SARS Coronavirus 2 by RT PCR: NEGATIVE

## 2020-02-13 LAB — MAGNESIUM: Magnesium: 2.1 mg/dL (ref 1.7–2.4)

## 2020-02-13 LAB — BRAIN NATRIURETIC PEPTIDE: B Natriuretic Peptide: 155 pg/mL — ABNORMAL HIGH (ref 0.0–100.0)

## 2020-02-13 LAB — GLUCOSE, CAPILLARY: Glucose-Capillary: 130 mg/dL — ABNORMAL HIGH (ref 70–99)

## 2020-02-13 MED ORDER — FUROSEMIDE 10 MG/ML IJ SOLN
40.0000 mg | Freq: Two times a day (BID) | INTRAMUSCULAR | Status: DC
  Administered 2020-02-14 – 2020-02-15 (×3): 40 mg via INTRAVENOUS
  Filled 2020-02-13 (×3): qty 4

## 2020-02-13 MED ORDER — POLYETHYLENE GLYCOL 3350 17 G PO PACK
17.0000 g | PACK | Freq: Every day | ORAL | Status: DC
  Administered 2020-02-14 – 2020-02-19 (×6): 17 g via ORAL
  Filled 2020-02-13 (×7): qty 1

## 2020-02-13 MED ORDER — COVID-19 MRNA VACC (MODERNA) 50 MCG/0.25ML IM SUSP
0.2500 mL | Freq: Once | INTRAMUSCULAR | Status: DC
  Filled 2020-02-13 (×2): qty 0.25

## 2020-02-13 MED ORDER — FLUOXETINE HCL 20 MG PO CAPS
20.0000 mg | ORAL_CAPSULE | Freq: Every day | ORAL | Status: DC
  Administered 2020-02-14 – 2020-02-19 (×6): 20 mg via ORAL
  Filled 2020-02-13 (×6): qty 1

## 2020-02-13 MED ORDER — SPIRONOLACTONE 25 MG PO TABS
25.0000 mg | ORAL_TABLET | Freq: Every day | ORAL | Status: DC
  Administered 2020-02-14 – 2020-02-19 (×6): 25 mg via ORAL
  Filled 2020-02-13 (×8): qty 1

## 2020-02-13 MED ORDER — ACETAMINOPHEN 650 MG RE SUPP: 650.0000 mg | Freq: Four times a day (QID) | RECTAL | Status: DC | PRN

## 2020-02-13 MED ORDER — POTASSIUM CHLORIDE CRYS ER 20 MEQ PO TBCR
40.0000 meq | EXTENDED_RELEASE_TABLET | ORAL | Status: AC
  Administered 2020-02-13 (×2): 40 meq via ORAL
  Filled 2020-02-13 (×2): qty 2

## 2020-02-13 MED ORDER — PANTOPRAZOLE SODIUM 40 MG PO TBEC
40.0000 mg | DELAYED_RELEASE_TABLET | Freq: Every day | ORAL | Status: DC
  Administered 2020-02-14 – 2020-02-19 (×6): 40 mg via ORAL
  Filled 2020-02-13 (×6): qty 1

## 2020-02-13 MED ORDER — ALPRAZOLAM 0.5 MG PO TABS
0.5000 mg | ORAL_TABLET | Freq: Every day | ORAL | Status: AC
  Administered 2020-02-13: 0.5 mg via ORAL
  Filled 2020-02-13: qty 1

## 2020-02-13 MED ORDER — ALBUMIN HUMAN 25 % IV SOLN
50.0000 g | Freq: Every day | INTRAVENOUS | Status: AC
  Administered 2020-02-13: 12.5 g via INTRAVENOUS
  Administered 2020-02-14 – 2020-02-17 (×4): 50 g via INTRAVENOUS
  Filled 2020-02-13 (×5): qty 200

## 2020-02-13 MED ORDER — ACETAMINOPHEN 325 MG PO TABS
650.0000 mg | ORAL_TABLET | Freq: Four times a day (QID) | ORAL | Status: DC | PRN
  Administered 2020-02-15 – 2020-02-19 (×4): 650 mg via ORAL
  Filled 2020-02-13 (×4): qty 2

## 2020-02-13 MED ORDER — HYDROMORPHONE HCL 1 MG/ML IJ SOLN
0.5000 mg | Freq: Once | INTRAMUSCULAR | Status: AC
  Administered 2020-02-13: 0.5 mg via INTRAVENOUS
  Filled 2020-02-13: qty 0.5

## 2020-02-13 MED ORDER — FUROSEMIDE 10 MG/ML IJ SOLN
40.0000 mg | Freq: Once | INTRAMUSCULAR | Status: AC
  Administered 2020-02-13: 40 mg via INTRAVENOUS
  Filled 2020-02-13: qty 4

## 2020-02-13 MED ORDER — POTASSIUM CHLORIDE 10 MEQ/100ML IV SOLN
10.0000 meq | INTRAVENOUS | Status: AC
  Administered 2020-02-13 (×2): 10 meq via INTRAVENOUS
  Filled 2020-02-13 (×2): qty 100

## 2020-02-13 MED ORDER — EZETIMIBE 10 MG PO TABS
10.0000 mg | ORAL_TABLET | Freq: Every day | ORAL | Status: DC
  Administered 2020-02-13 – 2020-02-18 (×6): 10 mg via ORAL
  Filled 2020-02-13 (×7): qty 1

## 2020-02-13 MED ORDER — HYDROMORPHONE HCL 1 MG/ML IJ SOLN
1.0000 mg | Freq: Once | INTRAMUSCULAR | Status: AC
  Administered 2020-02-13: 1 mg via INTRAVENOUS
  Filled 2020-02-13: qty 1

## 2020-02-13 MED ORDER — CYCLOBENZAPRINE HCL 10 MG PO TABS
5.0000 mg | ORAL_TABLET | Freq: Three times a day (TID) | ORAL | Status: DC | PRN
  Administered 2020-02-17 – 2020-02-18 (×3): 5 mg via ORAL
  Filled 2020-02-13 (×4): qty 1

## 2020-02-13 MED ORDER — POTASSIUM CHLORIDE ER 10 MEQ PO TBCR
10.0000 meq | EXTENDED_RELEASE_TABLET | Freq: Two times a day (BID) | ORAL | Status: DC
  Administered 2020-02-14: 10 meq via ORAL
  Filled 2020-02-13 (×8): qty 1

## 2020-02-13 NOTE — Telephone Encounter (Signed)
Phillip Heal called the office to let us know that she was taking the patient to the ED/[place]. The edema in the lower extremities has worsened. The patient had appointment at her PCP yesterday, and before leaving that office the right leg and foot began to weep.  Dr.Rehman has been made aware.

## 2020-02-13 NOTE — ED Provider Notes (Signed)
[facility] Provider Note   CSN: [id number] Arrival date & time: 02/13/20  1158     History Chief Complaint  Patient presents with  . Edema    Samantha [name] is a 69 y.o. female with a history of cirrhosis of the liver, coronary artery disease status post cardiac stenting, chronic lower extremity edema, presenting to emergency department with worsening swelling of her legs.  This has been a chronic issue with his been on and off for a long time.  She was seen by her GI doctor Dr Laural Golden this week who switched her from torsemide to lasix 40 mg BID, which she started today.  She reports she has been urinating frequently since starting this, but feels like her lower extremity edema is reached a point where she cannot even walk at home.  She presents with her sister-in-law reports that the patient is unable to ambulate and will need to be hospitalized.  The patient reports that she fell trying to get up and use the bathroom today, now reporting significant pain in her lower back.  Reports a history of spinal fusion down here and has chronic back pain, but is worse today.  She had negative DVT ultrasound performed approximately 1 month ago presenting for similar complaints.  Medical record review shows that she was again hospitalized approximately 1 day at the start of December for worsening lower extremity edema at that time.  IMPRESSIONS - Last echo on 01/24/20, TTE  1. Left ventricular ejection fraction, by estimation, is 35 to 40%. The left ventricle has moderately decreased function. The left ventricle  demonstrates global hypokinesis. Left ventricular diastolic function could  not be evaluated. There is severe  hypokinesis of the left ventricular, entire inferior wall and inferolateral wall.  2. Right ventricular systolic function is mildly reduced. The right  ventricular size is normal. There is mildly elevated pulmonary artery  systolic pressure.  3. Left atrial  size was severely dilated.  4. Right atrial size was severely dilated.  5. The mitral valve is normal in structure. Mild to moderate mitral valve  regurgitation.  6. Tricuspid valve regurgitation is moderate.  7. The aortic valve is tricuspid. Aortic valve regurgitation is not  visualized. Mild aortic valve sclerosis is present, with no evidence of  aortic valve stenosis.  8. The inferior vena cava is normal in size with <50% respiratory  variability, suggesting right atrial pressure of 8 mmHg.   HPI     Past Medical History:  Diagnosis Date  . Cataract    OU  . Chronic combined systolic and diastolic CHF (congestive heart failure) ([place])   . Cirrhosis of liver ([place])   . CKD (chronic kidney disease), stage III ([place])   . Coronary artery disease    a. s/p CABG 2001 w/ (LIMA-OM, SVG-D1, SVG-RCA). b. h/o multiple PCIs per Dr. Antionette Char note.  . Depression   . Esophageal varices ([place])    New 2013  . Gastroesophageal reflux disease   . History of pneumonia   . Hyperlipidemia   . Hypertension   . Hypertensive retinopathy    OU  . Obesity   . Osteoarthritis   . Pancytopenia ([place])   . Paroxysmal atrial flutter ([place])    a. dx 05/2016.  [name] Persistent atrial fibrillation ([place])    a. reported in hosp 07/2016, not on anticoag due to cirrhosis and liver disease, low platelets, varices.  . Thrombocytopenia [facility])     Patient Active Problem List  Diagnosis Date Noted  . Anasarca 02/14/2020  . Cirrhosis of liver with ascites ([place]) 02/14/2020  . Volume overload 02/13/2020  . Constipation 02/11/2020  . Peripheral edema 02/03/2020  . Thrombocytopenia ([place]) 01/24/2020  . Left leg swelling 01/24/2020  . Hyperammonemia ([place]) 01/24/2020  . Prolonged QT interval 01/24/2020  . Elevated troponin 01/24/2020  . Intractable nausea and vomiting 01/23/2020  . Atypical chest pain 08/06/2019  . GERD (gastroesophageal reflux disease) 08/06/2019  . Acute respiratory failure with hypoxia ([place])  05/23/2019  . Abdominal pain 05/20/2019  . Intractable abdominal pain 05/19/2019  . Paroxysmal atrial fibrillation ([place]) 05/19/2019  . Anxiety with depression 05/19/2019  . Sinus arrhythmia 05/19/2019  . Iron deficiency anemia 02/05/2019  . Chronic combined systolic and diastolic CHF (congestive heart failure) ([place]) 04/17/2018  . Heart failure ([place]) 04/17/2018  . Osteoarthritis of spine with radiculopathy, cervical region 01/12/2018  . Paresthesia of both hands 12/14/2017  . Recurrent falls 12/14/2017  . Closed compression fracture of first lumbar vertebra ([place]) 08/23/2017  . Lumbar spondylosis with myelopathy 08/23/2017  . Carpal tunnel syndrome of right wrist 04/05/2017  . Rectal pain 03/06/2017  . Influenza A (H1N1) 02/22/2017  . Rectal bleeding 02/22/2017  . Closed fracture of lower end of right radius with routine healing 02/20/2017  . Injury of triangular fibrocartilage complex of right wrist 02/20/2017  . Pain 02/10/2017  . Other cirrhosis of liver ([place]) 10/26/2016  . Persistent atrial fibrillation 08/16/2016  . Cardiomyopathy, ischemic-35-40% by cath  01/11/2015  . Obesity 09/24/2013  . Unspecified hereditary and idiopathic peripheral neuropathy 01/10/2013  . Hereditary and idiopathic peripheral neuropathy 01/10/2013  . Cirrhosis, non-alcoholic ([place]) 99/37/1696  . Esophageal varices- Plavix stopped 12/23/2011  . GI bleed 12/23/2011  . Idiopathic esophageal varices without bleeding ([place]) 12/23/2011  . Anemia 04/22/2011  . CAD (coronary artery disease) 08/21/2009  . Mixed hyperlipidemia 06/04/2008  . Essential hypertension 06/04/2008  . S/P CABG x 3- 2001 06/04/2008    Past Surgical History:  Procedure Laterality Date  . ABDOMINAL HYSTERECTOMY    . BACK SURGERY    . CARDIAC CATHETERIZATION  2004   left internal mammary artery to the obtuse marginal  was found to be small and thread like.  The two grafts were patent.  The left circumflex had 90% in-stent restenosis and  cutting balloon angioplasty was performed followed by placement of a 3.0 x 63m Taxus drug -eluting stent.    .Marland KitchenCARDIAC CATHETERIZATION  2006   There was in-stent restenosis in the left circumflex and this was treated with cutting balloon angioplasty   . CARDIAC CATHETERIZATION  2008   vein graft to the to the obtuse marginal was patent, although small, left circumflex had 40% in-stent restenosis, ejection fraction 40-45%.  The patient was medically mananged.  .Marland KitchenCARDIAC CATHETERIZATION N/A 01/09/2015   Procedure: Left Heart Cath and Cors/Grafts Angiography;  Surgeon: HBelva Crome MD;  [facility];  Service: Cardiovascular;  Laterality: N/A;  . CATARACT EXTRACTION W/PHACO Right 05/17/2019   Procedure: CATARACT EXTRACTION PHACO AND INTRAOCULAR LENS PLACEMENT (IOC) (CDE: 14.99);  Surgeon: WBaruch Goldmann MD;  Location: [facility];  Service: Ophthalmology;  Laterality: Right;  . CATARACT EXTRACTION W/PHACO Left 06/10/2019   Procedure: CATARACT EXTRACTION PHACO AND INTRAOCULAR LENS PLACEMENT (IOC);  Surgeon: WBaruch Goldmann MD;  Location: [facility];  Service: Ophthalmology;  Laterality: Left;  CDE: 11.96  . cataract sx    . COLONOSCOPY  03/29/2011   Procedure: COLONOSCOPY;  Surgeon: MJamesetta So  MD;  Location[facility];  Service: Gastroenterology;  Laterality: N/A;  . CORONARY ARTERY BYPASS GRAFT  May 31,2001   x 3 with a vein graft to the first diagonal, vein graft to the right coronary  artery, and a free left internal mammary  artery to the obtuse marginal   . ESOPHAGEAL BANDING N/A 07/04/2012   Procedure: ESOPHAGEAL BANDING;  Surgeon: Rogene Houston, MD;  [facility];  Service: Endoscopy;  Laterality: N/A;  . ESOPHAGEAL BANDING N/A 09/17/2012   Procedure: ESOPHAGEAL BANDING;  Surgeon: Rogene Houston, MD;  [facility];  Service: Endoscopy;  Laterality: N/A;  . ESOPHAGEAL BANDING N/A 10/22/2013   Procedure: ESOPHAGEAL BANDING;  Surgeon: Rogene Houston, MD;   [facility];  Service: Endoscopy;  Laterality: N/A;  . ESOPHAGEAL BANDING N/A 11/28/2014   Procedure: ESOPHAGEAL BANDING;  Surgeon: Rogene Houston, MD;  [facility];  Service: Endoscopy;  Laterality: N/A;  . ESOPHAGEAL BANDING N/A 10/29/2015   Procedure: ESOPHAGEAL BANDING;  Surgeon: Rogene Houston, MD;  [facility];  Service: Endoscopy;  Laterality: N/A;  . ESOPHAGOGASTRODUODENOSCOPY  12/20/2011   Procedure: ESOPHAGOGASTRODUODENOSCOPY (EGD);  Surgeon: Jamesetta So, MD;  [facility];  Service: Gastroenterology;  Laterality: N/A;  . ESOPHAGOGASTRODUODENOSCOPY N/A 07/04/2012   Procedure: ESOPHAGOGASTRODUODENOSCOPY (EGD);  Surgeon: Rogene Houston, MD;  [facility];  Service: Endoscopy;  Laterality: N/A;  235-moved to 255 Ann to notify pt  . ESOPHAGOGASTRODUODENOSCOPY N/A 09/17/2012   Procedure: ESOPHAGOGASTRODUODENOSCOPY (EGD);  Surgeon: Rogene Houston, MD;  [facility];  Service: Endoscopy;  Laterality: N/A;  730  . ESOPHAGOGASTRODUODENOSCOPY N/A 10/22/2013   Procedure: ESOPHAGOGASTRODUODENOSCOPY (EGD);  Surgeon: Rogene Houston, MD;  [facility];  Service: Endoscopy;  Laterality: N/A;  730  . ESOPHAGOGASTRODUODENOSCOPY N/A 11/28/2014   Procedure: ESOPHAGOGASTRODUODENOSCOPY (EGD);  Surgeon: Rogene Houston, MD;  [facility];  Service: Endoscopy;  Laterality: N/A;  1:25  . ESOPHAGOGASTRODUODENOSCOPY N/A 10/29/2015   Procedure: ESOPHAGOGASTRODUODENOSCOPY (EGD);  Surgeon: Rogene Houston, MD;  [facility];  Service: Endoscopy;  Laterality: N/A;  12:00  . ESOPHAGOGASTRODUODENOSCOPY N/A 12/12/2016   Grade 1 and 2 varices in lower third of esophagus, post variceal banding scar distal esophagus, mild portal hypertensive gastropathy, Type 1 gastroesophageal varices without bleeding, normal duodenum and second portion of duodenum.   [name] FLEXIBLE SIGMOIDOSCOPY N/A 03/20/2017   external hemorrhoids  . JOINT  REPLACEMENT    . LEFT HEART CATH AND CORS/GRAFTS ANGIOGRAPHY N/A 08/15/2016   Procedure: Left Heart Cath and Cors/Grafts Angiography;  Surgeon: Jettie Booze, MD;  Location: [facility];  Service: Cardiovascular;  Laterality: N/A;  . LEFT HEART CATHETERIZATION WITH CORONARY/GRAFT ANGIOGRAM N/A 12/25/2013   Procedure: LEFT HEART CATHETERIZATION WITH Beatrix Fetters;  Surgeon: Blane Ohara, MD;  Location: [facility];  Service: Cardiovascular;  Laterality: N/A;  . RIGHT HEART CATH N/A 05/29/2017   Procedure: RIGHT HEART CATH;  Surgeon: Jolaine Artist, MD;  Location: [facility];  Service: Cardiovascular;  Laterality: N/A;  . rotator cuff left  2007  . TONSILLECTOMY       OB History   No obstetric history on file.     Family History  Problem Relation Age of Onset  . Heart attack Mother   . Diabetes Mother   . Heart attack Father 56       cause of death  .  Diabetes Father   . Coronary artery disease Sister        CABG   . Diabetes Sister   . Glaucoma Sister   . Diabetes Brother   . Colon cancer Neg Hx     Social History   Tobacco Use  . Smoking status: Former Smoker    Packs/day: 0.50    Years: 20.00    Pack years: 10.00    Types: Cigarettes    Quit date: 07/21/1995    Years since quitting: 24.5  . Smokeless tobacco: Never Used  Vaping Use  . Vaping Use: Never used  Substance Use Topics  . Alcohol use: No  . Drug use: No    Home Medications Prior to Admission medications   Medication Sig Start Date End Date Taking? Authorizing Provider  ezetimibe (ZETIA) 10 MG tablet Take 10 mg by mouth at bedtime.   Yes [provider]  FLUoxetine (PROZAC) 20 MG capsule Take 20 mg by mouth daily.    Yes [provider]  furosemide (LASIX) 40 MG tablet Take 1 tablet (40 mg total) by mouth 2 (two) times daily. 02/11/20  Yes Rehman, Mechele Dawley, MD  isosorbide mononitrate (IMDUR) 60 MG 24 hr tablet Take 60 mg by mouth daily. 02/09/20   Yes [provider]  metoprolol succinate (TOPROL-XL) 25 MG 24 hr tablet Take 0.5 tablets (12.5 mg total) by mouth daily. 01/29/20  Yes Tat, Shanon Brow, MD  pantoprazole (PROTONIX) 40 MG tablet Take 40 mg by mouth daily.   Yes [provider]  polyethylene glycol (MIRALAX / GLYCOLAX) 17 g packet Take 25.5 g by mouth daily. 02/11/20  Yes Rehman, Mechele Dawley, MD  potassium chloride (KLOR-CON) 10 MEQ tablet Take 1 tablet (10 mEq total) by mouth 2 (two) times daily. 02/04/20 03/05/20 Yes Shah, Pratik D, DO  spironolactone (ALDACTONE) 25 MG tablet Take 1 tablet (25 mg total) by mouth daily. 02/04/20 03/05/20 Yes Shah, Pratik D, DO  albuterol (PROVENTIL HFA;VENTOLIN HFA) 108 (90 BASE) MCG/ACT inhaler Inhale 2 puffs into the lungs every 6 (six) hours as needed for wheezing or shortness of breath.     [provider]  ALPRAZolam Duanne Moron) 0.5 MG tablet Take 0.5 mg by mouth 3 (three) times daily as needed for sleep. 02/12/20   [provider]  cyclobenzaprine (FLEXERIL) 10 MG tablet Take 10 mg by mouth 3 (three) times daily as needed. 02/12/20   [provider]  hydrocortisone (ANUSOL-HC) 25 MG suppository Place 1 suppository (25 mg total) rectally 2 (two) times daily. Patient not taking: No sig reported 02/04/20   Manuella Ghazi, Pratik D, DO  ipratropium (ATROVENT) 0.03 % nasal spray Place 1 spray into both nostrils daily as needed (allergies).  12/23/19   [provider]  NITROSTAT 0.4 MG SL tablet Place 0.4 mg under the tongue every 5 (five) minutes as needed for chest pain (x 3 tabs daily).  11/15/13   [provider]  ondansetron (ZOFRAN ODT) 4 MG disintegrating tablet Take 1 tablet (4 mg total) by mouth every 8 (eight) hours as needed for nausea or vomiting. 02/04/20   Manuella Ghazi, Pratik D, DO  psyllium (METAMUCIL SMOOTH TEXTURE) 58.6 % powder Take 1 packet by mouth at bedtime. 02/11/20   Rogene Houston, MD    Allergies    Entresto [sacubitril-valsartan], Acetaminophen,  Oxycodone, Ace inhibitors, Cefaclor, Cephalexin, Penicillins, Pregabalin, and Tape  Review of Systems   Review of Systems  Constitutional: Negative for chills and fever.  Eyes: Negative for pain  and visual disturbance.  Respiratory: Positive for shortness of breath. Negative for cough.   Cardiovascular: Negative for chest pain and palpitations.  Gastrointestinal: Positive for nausea. Negative for abdominal pain and vomiting.  Genitourinary: Negative for dysuria and hematuria.  Musculoskeletal: Positive for arthralgias, joint swelling and myalgias.  Skin: Positive for wound. Negative for rash.  Neurological: Negative for seizures and speech difficulty.  Psychiatric/Behavioral: Negative for agitation and confusion.  All other systems reviewed and are negative.   Physical Exam Updated Vital Signs BP (!) 100/53   Pulse 96   Temp 98.2 F (36.8 C)   Resp 19   Ht 5' 2"  (1.575 m)   Wt 87.4 kg   LMP 03/29/2011   SpO2 97%   BMI 35.24 kg/m   Physical Exam Vitals and nursing note reviewed.  Constitutional:      General: She is not in acute distress.    Appearance: She is well-developed and well-nourished.  HENT:     Head: Normocephalic and atraumatic.  Eyes:     Conjunctiva/sclera: Conjunctivae normal.  Cardiovascular:     Rate and Rhythm: Normal rate and regular rhythm.     Pulses: Normal pulses.  Pulmonary:     Effort: Pulmonary effort is normal. No respiratory distress.     Breath sounds: Normal breath sounds.  Abdominal:     General: There is distension.     Palpations: Abdomen is soft.     Tenderness: There is no abdominal tenderness. There is no guarding.     Comments: No significant fluid wave  Musculoskeletal:        General: No edema.     Cervical back: Neck supple.  Skin:    General: Skin is warm and dry.     Comments: Significant pitting edema of the bilateral lower extremities with weeping of the right foot, no overlying erythema of the skin  Neurological:      General: No focal deficit present.     Mental Status: She is alert and oriented to person, place, and time.  Psychiatric:        Mood and Affect: Mood and affect normal.     ED Results / Procedures / Treatments   Labs (all labs ordered are listed, but only abnormal results are displayed) Labs Reviewed  COMPREHENSIVE METABOLIC PANEL - Abnormal; Notable for the following components:      Result Value   Potassium 2.8 (*)    Glucose, Bld 63 (*)    Creatinine, Ser 1.04 (*)    Albumin 2.7 (*)    AST 58 (*)    Total Bilirubin 5.2 (*)    GFR, Estimated 57 (*)    All other components within normal limits  CBC - Abnormal; Notable for the following components:   RBC 3.74 (*)    RDW 18.2 (*)    Platelets 49 (*)    All other components within normal limits  PROTIME-INR - Abnormal; Notable for the following components:   Prothrombin Time 18.6 (*)    INR 1.6 (*)    All other components within normal limits  BRAIN NATRIURETIC PEPTIDE - Abnormal; Notable for the following components:   B Natriuretic Peptide 155.0 (*)    All other components within normal limits  GLUCOSE, CAPILLARY - Abnormal; Notable for the following components:   Glucose-Capillary 130 (*)    All other components within normal limits  RESP PANEL BY RT-PCR (FLU A&B, COVID) ARPGX2  LIPASE, BLOOD  MAGNESIUM    EKG EKG Interpretation  Date/Time:  Thursday February 13 2020 18:47:14 EST Ventricular Rate:  107 PR Interval:    QRS Duration: 168 QT Interval:  428 QTC Calculation: 572 R Axis:   91 Text Interpretation: Sinus or ectopic atrial tachycardia Consider left ventricular hypertrophy ST depr, consider ischemia, inferior leads Abnormal T, consider ischemia, lateral leads Prolonged QT interval No significant change frmo Feb 02 2020 ecg Confirmed by Octaviano Glow [phone]) on 02/13/2020 7:03:55 PM   Radiology CT Lumbar Spine Wo Contrast  Result Date: 02/13/2020 CLINICAL DATA:  Low back pain, trauma, fall into low  back. Acute midline tenderness. EXAM: CT LUMBAR SPINE WITHOUT CONTRAST TECHNIQUE: Multidetector CT imaging of the lumbar spine was performed without intravenous contrast administration. Multiplanar CT image reconstructions were also generated. COMPARISON:  Reformats from abdominal CT 01/24/2020, report from lumbar spine MRI 12/12/2017, images not available FINDINGS: Segmentation: 5 lumbar type vertebrae. Alignment: Normal. Vertebrae: Chronic L1 compression fracture, unchanged from prior exam, with vertebral plasty. No interval progression. The remaining vertebral body heights are normal. No acute fracture. Posterior elements are intact. Prior laminectomies at L4 and L5. Paraspinal and other soft tissues: Small amount of cement in the left paraspinal musculature adjacent to L1 from prior vertebroplasty. Otherwise negative. Disc levels: Interbody spacer at L4-L5 which is slightly posteriorly positioned, also described on prior exam. Interbody spacer at L5-S1 appear seated. Disc space narrowing at L1-L2. There is mild multilevel facet hypertrophy. IMPRESSION: 1. No acute fracture or subluxation of the lumbar spine. 2. Chronic L1 compression fracture with vertebral augmentation, unchanged from prior exam. 3. Postsurgical change at L4-L5 and L5-S1 with interbody spacers. L4-L5 interbody cages slightly posteriorly positions, also described on prior MRI and likely chronic. Electronically Signed   By: Keith Rake M.D.   On: 02/13/2020 17:15   DG CHEST PORT 1 VIEW  Result Date: 02/13/2020 CLINICAL DATA:  Bilateral leg swelling. EXAM: PORTABLE CHEST 1 VIEW COMPARISON:  02/02/2020 FINDINGS: Post median sternotomy. The lowest most sternal wire is broken, chronic. Cardiomegaly is stable. Vascular congestion. Possible mild pulmonary edema with suspected basilar septal thickening. Multifocal linear atelectasis or scarring. No confluent consolidation. No pleural fluid. No pneumothorax. No acute osseous abnormalities are  seen. IMPRESSION: 1. Cardiomegaly with vascular congestion and possible mild pulmonary edema. 2. Multifocal linear atelectasis or scarring. Electronically Signed   By: Keith Rake M.D.   On: 02/13/2020 18:53    Procedures .Critical Care Performed by: Wyvonnia Dusky, MD Authorized by: Wyvonnia Dusky, MD   Critical care provider statement:    Critical care time (minutes):  45   Critical care was necessary to treat or prevent imminent or life-threatening deterioration of the following conditions:  Circulatory failure   Critical care was time spent personally by me on the following activities:  Discussions with consultants, evaluation of patient's response to treatment, examination of patient, ordering and performing treatments and interventions, ordering and review of laboratory studies, ordering and review of radiographic studies, pulse oximetry, re-evaluation of patient's condition, obtaining history from patient or surrogate and review of old charts Comments:     IV diuresis and IV albumin for edema, low albumin IV lasix for hypokalemia   (including critical care time)  Medications Ordered in ED Medications  albumin human 25 % solution 50 g (50 g Intravenous New Bag/Given 02/14/20 0906)  ezetimibe (ZETIA) tablet 10 mg (10 mg Oral Given 02/13/20 2151)  spironolactone (ALDACTONE) tablet 25 mg (25 mg Oral Given 02/14/20 0859)  furosemide (LASIX) injection 40 mg (40 mg Intravenous  Given 02/14/20 0859)  acetaminophen (TYLENOL) tablet 650 mg (has no administration in time range)    Or  acetaminophen (TYLENOL) suppository 650 mg (has no administration in time range)  polyethylene glycol (MIRALAX / GLYCOLAX) packet 17 g (17 g Oral Given 02/14/20 0859)  COVID-19 mRNA vaccine (Moderna) injection 0.25 mL (has no administration in time range)  cyclobenzaprine (FLEXERIL) tablet 5 mg (has no administration in time range)  FLUoxetine (PROZAC) capsule 20 mg (20 mg Oral Given 02/14/20 0859)   pantoprazole (PROTONIX) EC tablet 40 mg (40 mg Oral Given 02/14/20 0859)  HYDROmorphone (DILAUDID) injection 0.5 mg (0.5 mg Intravenous Given 02/14/20 1022)  potassium chloride SA (KLOR-CON) CR tablet 20 mEq (has no administration in time range)  HYDROmorphone (DILAUDID) injection 1 mg (1 mg Intravenous Given 02/13/20 1646)  potassium chloride 10 mEq in 100 mL IVPB (0 mEq Intravenous Stopped 02/13/20 2142)  furosemide (LASIX) injection 40 mg (40 mg Intravenous Given 02/13/20 1747)  potassium chloride SA (KLOR-CON) CR tablet 40 mEq (40 mEq Oral Given 02/13/20 2150)  ALPRAZolam (XANAX) tablet 0.5 mg (0.5 mg Oral Given 02/13/20 2150)  HYDROmorphone (DILAUDID) injection 0.5 mg (0.5 mg Intravenous Given 02/13/20 2326)    ED Course  I have reviewed the triage vital signs and the nursing notes.  Pertinent labs & imaging results that were available during my care of the patient were reviewed by me and considered in my medical decision making (see chart for details).  This patient complains of acute on chronic symmetrical LE edema.  This involves an extensive number of treatment options, and is a complaint that carries with it a high risk of complications and morbidity.  The differential diagnosis includes worsening CHF vs worsening cirrhosis vs other  Doubt DVT with symmetrical findings and negative DVT study 1 month ago with similar presentation  Doubt cellulitis or infection at this time  I ordered, reviewed, and interpreted labs, which included CMP with K 2.8, INR 1.6, Cr 1.04, BNP 155, Lipase 44.  Mag pending. I ordered medication IV dilaudid, IV potassium, IV albumin, and IV lasix for back pain, hypoK, low alb, and diuresis I ordered imaging studies which included CT lumbar spine I independently visualized and interpreted imaging which showed no acute fracture  Previous records obtained and reviewed showing GI office visit, prior hospital course I consulted gastroenterology and discussed  lab and imaging findings, as noted in ED course below   Clinical Course as of 02/14/20 1104  Thu Feb 13, 2020  1725 I spoke to Dr Laural Golden from GI who advises medical admission for IV albumin 50 mg daily for up to 3-5 days as needed, consider cardiology consultation as the entirety of her LE edema cannot be attributed to liver disease alone.  He is okay with continued IV lasix. [MT]  [id number] Signed out to hospitalist for admission [MT]    Clinical Course User Index [MT] Adabelle Griffiths, Carola Rhine, MD    Final Clinical Impression(s) / ED Diagnoses Final diagnoses:  Edema, unspecified type  Hepatic cirrhosis, unspecified hepatic cirrhosis type, unspecified whether ascites present ([place])  Hypokalemia  Abnormal albumin    Rx / DC Orders ED Discharge Orders    None       Wyvonnia Dusky, MD 02/14/20 1104

## 2020-02-13 NOTE — TOC Initial Note (Signed)
Transition of Care [facility]) - Initial/Assessment Note    Patient Details  Name: Samantha [name] MRN: [id number] Date of Birth: Jan 15, 1951  [facility] of [facility]) CM/SW Contact:    Iona Beard, [place] Phone Number: 02/13/2020, 8:32 PM  Clinical Narrative:                 Pt admitted due to volume overload. TOC received consult for CHF screen. CSW visited pt in ED to complete assessment. Pt states that she has recently moved in with her brother and sister in law. Pt states that she tries her best to complete ADLs but her sister in law will assist if needed. Pt states that she does not need to drive any more and her brother and sister in law will provide needed transportation. Pt states that she has had [employer] in the past but does not remember the company. Pt states her brother is putting in a handicap commode for her,  She has a walker, rollator, cane, and blood pressure cuff.   CSW completed CHF screen with pt. Pt states that she weighs daily, follows a heart healthy diet, takes medications as prescribed. Pt states her cardiologist is Dr. Burt Knack however, she is in the process of being set up with a cardiologist closer to [facility]. TOC to follow for possible discharge needs.   Expected Discharge Plan: Home/Self Care Barriers to Discharge: Continued Medical Work up   Patient Goals and CMS Choice Patient states their goals for this hospitalization and ongoing recovery are:: Return to brothers home   Choice offered to / list presented to : NA  Expected Discharge Plan and Services Expected Discharge Plan: Home/Self Care In-house Referral: Clinical Social Work Discharge Planning Services: CM Consult Post Acute Care Choice: NA Living arrangements for the past 2 months: Single Family Home                 DME Arranged: N/A DME Agency: NA       HH Arranged: NA HH Agency: NA        Prior Living Arrangements/Services Living arrangements for the past 2 months: Single Family Home Lives  with:: Siblings Patient language and need for interpreter reviewed:: Yes Do you feel safe going back to the place where you live?: Yes      Need for Family Participation in Patient Care: No (Comment) Care giver support system in place?: Yes (comment)   Criminal Activity/Legal Involvement Pertinent to Current Situation/Hospitalization: No - Comment as needed  Activities of Daily Living      Permission Sought/Granted                  Emotional Assessment Appearance:: Appears stated age Attitude/Demeanor/Rapport: Engaged Affect (typically observed): Accepting Orientation: : Oriented to Self,Oriented to Place,Oriented to  Time,Oriented to Situation Alcohol / Substance Use: Not Applicable Psych Involvement: No (comment)  Admission diagnosis:  Volume overload [E87.70] Patient Active Problem List   Diagnosis Date Noted  . Volume overload 02/13/2020  . Constipation 02/11/2020  . Peripheral edema 02/03/2020  . Thrombocytopenia ([place]) 01/24/2020  . Left leg swelling 01/24/2020  . Hyperammonemia ([place]) 01/24/2020  . Prolonged QT interval 01/24/2020  . Elevated troponin 01/24/2020  . Intractable nausea and vomiting 01/23/2020  . Atypical chest pain 08/06/2019  . GERD (gastroesophageal reflux disease) 08/06/2019  . Acute respiratory failure with hypoxia ([place]) 05/23/2019  . Abdominal pain 05/20/2019  . Intractable abdominal pain 05/19/2019  . Paroxysmal atrial fibrillation ([place]) 05/19/2019  . Anxiety with depression 05/19/2019  .  Sinus arrhythmia 05/19/2019  . Iron deficiency anemia 02/05/2019  . Chronic combined systolic and diastolic CHF (congestive heart failure) ([place]) 04/17/2018  . Heart failure ([place]) 04/17/2018  . Osteoarthritis of spine with radiculopathy, cervical region 01/12/2018  . Paresthesia of both hands 12/14/2017  . Recurrent falls 12/14/2017  . Closed compression fracture of first lumbar vertebra ([place]) 08/23/2017  . Lumbar spondylosis with myelopathy 08/23/2017   . Carpal tunnel syndrome of right wrist 04/05/2017  . Rectal pain 03/06/2017  . Influenza A (H1N1) 02/22/2017  . Rectal bleeding 02/22/2017  . Closed fracture of lower end of right radius with routine healing 02/20/2017  . Injury of triangular fibrocartilage complex of right wrist 02/20/2017  . Pain 02/10/2017  . Other cirrhosis of liver ([place]) 10/26/2016  . Persistent atrial fibrillation 08/16/2016  . Cardiomyopathy, ischemic-35-40% by cath  01/11/2015  . Obesity 09/24/2013  . Unspecified hereditary and idiopathic peripheral neuropathy 01/10/2013  . Hereditary and idiopathic peripheral neuropathy 01/10/2013  . Cirrhosis, non-alcoholic ([place]) 85/46/2703  . Esophageal varices- Plavix stopped 12/23/2011  . GI bleed 12/23/2011  . Idiopathic esophageal varices without bleeding ([place]) 12/23/2011  . Anemia 04/22/2011  . CAD (coronary artery disease) 08/21/2009  . Mixed hyperlipidemia 06/04/2008  . Essential hypertension 06/04/2008  . S/P CABG x 3- 2001 06/04/2008   PCP:  [name], MD Pharmacy:   [id number] [place] [id number]: [phone]: [phone]    Social Determinants of Health (SDOH) Interventions    Readmission Risk Interventions No flowsheet data found.

## 2020-02-13 NOTE — H&P (Addendum)
History and Physical    Samantha [name] TGG:[id number] DOB: [birth year] DOA: 02/13/2020  PCP: [name], MD   Patient coming from: Home  I have personally briefly reviewed patient's old medical records in [place]  Chief Complaint:  Leg swelling  HPI: Samantha [name] is a 69 y.o. female with medical history significant for  CABG x3, non alcoholic liver disease, atria fibrillation, HTN. Patient presented to the ED with complaints of bilateral lower extremity swelling.  This has been an ongoing issue for several weeks to months, with hospitalizations 11/25-11/30 and 12/6 - 12/7.  Patient reports compliance with torsemide, she was switched to Lasix yesterday.  She reports improvement in urine output with Lasix.  But currently patient is unable to ambulate due to significant bilateral lower extremity swelling.  She is having weeping and blisters on her right lower extremities.  She denies abdominal bloating.  She reports she fell onto her back and is reporting lower back pain, did not hit head. She reports intermittent difficulty breathing.  Stable 1-2 pillow use.  No cough.  No chest pain.  ED Course: Temperature 97.9.  Heart rate 97-117, respiratory rate 14-21, blood pressure systolic 703 500.  O2 sats 92 to 96% on room air.  Potassium low 2.8.  BNP consistent with recent hospitalizations -mildly elevated at 155.  INR 1.6. EDP talked to Dr. Melony Overly, who recommended admission, IV albumin 50 mg daily for 3 to 5 days, with Lasix.  IV Lasix 40 mg  And albumin also given.   Review of Systems: As per HPI all other systems reviewed and negative.  Past Medical History:  Diagnosis Date  . Cataract    OU  . Chronic combined systolic and diastolic CHF (congestive heart failure) ([place])   . Cirrhosis of liver ([place])   . CKD (chronic kidney disease), stage III ([place])   . Coronary artery disease    a. s/p CABG 2001 w/ (LIMA-OM, SVG-D1, SVG-RCA). b. h/o multiple PCIs per Dr. Antionette Char note.  .  Depression   . Esophageal varices ([place])    New 2013  . Gastroesophageal reflux disease   . History of pneumonia   . Hyperlipidemia   . Hypertension   . Hypertensive retinopathy    OU  . Obesity   . Osteoarthritis   . Pancytopenia ([place])   . Paroxysmal atrial flutter ([place])    a. dx 05/2016.  [name] Persistent atrial fibrillation ([place])    a. reported in hosp 07/2016, not on anticoag due to cirrhosis and liver disease, low platelets, varices.  . Thrombocytopenia ([place])     Past Surgical History:  Procedure Laterality Date  . ABDOMINAL HYSTERECTOMY    . BACK SURGERY    . CARDIAC CATHETERIZATION  2004   left internal mammary artery to the obtuse marginal  was found to be small and thread like.  The two grafts were patent.  The left circumflex had 90% in-stent restenosis and cutting balloon angioplasty was performed followed by placement of a 3.0 x 110m Taxus drug -eluting stent.    .Marland KitchenCARDIAC CATHETERIZATION  2006   There was in-stent restenosis in the left circumflex and this was treated with cutting balloon angioplasty   . CARDIAC CATHETERIZATION  2008   vein graft to the to the obtuse marginal was patent, although small, left circumflex had 40% in-stent restenosis, ejection fraction 40-45%.  The patient was medically mananged.  .Marland KitchenCARDIAC CATHETERIZATION N/A 01/09/2015   Procedure: Left Heart Cath and Cors/Grafts Angiography;  Surgeon: Belva Crome, MD;  Location: [facility];  Service: Cardiovascular;  Laterality: N/A;  . CATARACT EXTRACTION W/PHACO Right 05/17/2019   Procedure: CATARACT EXTRACTION PHACO AND INTRAOCULAR LENS PLACEMENT (IOC) (CDE: 14.99);  Surgeon: Baruch Goldmann, MD;  Location: [facility];  Service: Ophthalmology;  Laterality: Right;  . CATARACT EXTRACTION W/PHACO Left 06/10/2019   Procedure: CATARACT EXTRACTION PHACO AND INTRAOCULAR LENS PLACEMENT (IOC);  Surgeon: Baruch Goldmann, MD;  Location: [facility];  Service: Ophthalmology;  Laterality: Left;  CDE: 11.96  . cataract sx     . COLONOSCOPY  03/29/2011   Procedure: COLONOSCOPY;  Surgeon: Jamesetta So, MD;  Location: [facility];  Service: Gastroenterology;  Laterality: N/A;  . CORONARY ARTERY BYPASS GRAFT  May 31,2001   x 3 with a vein graft to the first diagonal, vein graft to the right coronary  artery, and a free left internal mammary  artery to the obtuse marginal   . ESOPHAGEAL BANDING N/A 07/04/2012   Procedure: ESOPHAGEAL BANDING;  Surgeon: Rogene Houston, MD;  [facility];  Service: Endoscopy;  Laterality: N/A;  . ESOPHAGEAL BANDING N/A 09/17/2012   Procedure: ESOPHAGEAL BANDING;  Surgeon: Rogene Houston, MD;  [facility];  Service: Endoscopy;  Laterality: N/A;  . ESOPHAGEAL BANDING N/A 10/22/2013   Procedure: ESOPHAGEAL BANDING;  Surgeon: Rogene Houston, MD;  [facility];  Service: Endoscopy;  Laterality: N/A;  . ESOPHAGEAL BANDING N/A 11/28/2014   Procedure: ESOPHAGEAL BANDING;  Surgeon: Rogene Houston, MD;  [facility];  Service: Endoscopy;  Laterality: N/A;  . ESOPHAGEAL BANDING N/A 10/29/2015   Procedure: ESOPHAGEAL BANDING;  Surgeon: Rogene Houston, MD;  [facility];  Service: Endoscopy;  Laterality: N/A;  . ESOPHAGOGASTRODUODENOSCOPY  12/20/2011   Procedure: ESOPHAGOGASTRODUODENOSCOPY (EGD);  Surgeon: Jamesetta So, MD;  [facility];  Service: Gastroenterology;  Laterality: N/A;  . ESOPHAGOGASTRODUODENOSCOPY N/A 07/04/2012   Procedure: ESOPHAGOGASTRODUODENOSCOPY (EGD);  Surgeon: Rogene Houston, MD;  [facility];  Service: Endoscopy;  Laterality: N/A;  235-moved to 255 Ann to notify pt  . ESOPHAGOGASTRODUODENOSCOPY N/A 09/17/2012   Procedure: ESOPHAGOGASTRODUODENOSCOPY (EGD);  Surgeon: Rogene Houston, MD;  [facility];  Service: Endoscopy;  Laterality: N/A;  730  . ESOPHAGOGASTRODUODENOSCOPY N/A 10/22/2013   Procedure: ESOPHAGOGASTRODUODENOSCOPY (EGD);  Surgeon: Rogene Houston, MD;  [facility];  Service: Endoscopy;  Laterality: N/A;  730  . ESOPHAGOGASTRODUODENOSCOPY N/A 11/28/2014   Procedure: ESOPHAGOGASTRODUODENOSCOPY (EGD);  Surgeon: Rogene Houston, MD;  [facility];  Service: Endoscopy;  Laterality: N/A;  1:25  . ESOPHAGOGASTRODUODENOSCOPY N/A 10/29/2015   Procedure: ESOPHAGOGASTRODUODENOSCOPY (EGD);  Surgeon: Rogene Houston, MD;  [facility];  Service: Endoscopy;  Laterality: N/A;  12:00  . ESOPHAGOGASTRODUODENOSCOPY N/A 12/12/2016   Grade 1 and 2 varices in lower third of esophagus, post variceal banding scar distal esophagus, mild portal hypertensive gastropathy, Type 1 gastroesophageal varices without bleeding, normal duodenum and second portion of duodenum.   [name] FLEXIBLE SIGMOIDOSCOPY N/A 03/20/2017   external hemorrhoids  . JOINT REPLACEMENT    . LEFT HEART CATH AND CORS/GRAFTS ANGIOGRAPHY N/A 08/15/2016   Procedure: Left Heart Cath and Cors/Grafts Angiography;  Surgeon: Jettie Booze, MD;  Location: [facility];  Service: Cardiovascular;  Laterality: N/A;  . LEFT HEART CATHETERIZATION WITH CORONARY/GRAFT ANGIOGRAM N/A 12/25/2013   Procedure: LEFT HEART CATHETERIZATION WITH Beatrix Fetters;  Surgeon: Blane Ohara,  MD;  Location: [facility];  Service: Cardiovascular;  Laterality: N/A;  . RIGHT HEART CATH N/A 05/29/2017   Procedure: RIGHT HEART CATH;  Surgeon: Jolaine Artist, MD;  Location: [facility];  Service: Cardiovascular;  Laterality: N/A;  . rotator cuff left  2007  . TONSILLECTOMY       reports that she quit smoking about 24 years ago. Her smoking use included cigarettes. She has a 10.00 pack-year smoking history. She has never used smokeless tobacco. She reports that she does not drink alcohol and does not use drugs.  Allergies  Allergen Reactions  . Entresto [Sacubitril-Valsartan] Swelling    Swelling of eyes  . Acetaminophen Other (See Comments)    Liver problems  . Oxycodone Itching and  Nausea Only  . Ace Inhibitors Other (See Comments)    UNSURE OF REACTION TYPE   . Cefaclor Itching and Rash  . Cephalexin Itching and Rash  . Penicillins Rash    Has patient had a PCN reaction causing immediate rash, facial/tongue/throat swelling, SOB or lightheadedness with hypotension: YES Has patient had a PCN reaction causing severe rash involving mucus membranes or skin necrosis: NO Has patient had a PCN reaction that required hospitalization: YES Has patient had a PCN reaction occurring within the last 10 years: NO If all of the above answers are "NO", then may proceed with Cephalosporin use.   . Pregabalin Other (See Comments)    Retains fluid  . Tape Itching and Rash    Takes skin right off with medical tape--PAPER TAPE ONLY    Family History  Problem Relation Age of Onset  . Heart attack Mother   . Diabetes Mother   . Heart attack Father 43       cause of death  . Diabetes Father   . Coronary artery disease Sister        CABG   . Diabetes Sister   . Glaucoma Sister   . Diabetes Brother   . Colon cancer Neg Hx     Prior to Admission medications   Medication Sig Start Date End Date Taking? Authorizing Provider  albuterol (PROVENTIL HFA;VENTOLIN HFA) 108 (90 BASE) MCG/ACT inhaler Inhale 2 puffs into the lungs every 6 (six) hours as needed for wheezing or shortness of breath.     [provider]  ezetimibe (ZETIA) 10 MG tablet Take 10 mg by mouth at bedtime.    [provider]  FLUoxetine (PROZAC) 20 MG capsule Take 20 mg by mouth daily.     [provider]  furosemide (LASIX) 40 MG tablet Take 1 tablet (40 mg total) by mouth 2 (two) times daily. 02/11/20   Rehman, Mechele Dawley, MD  hydrocortisone (ANUSOL-HC) 25 MG suppository Place 1 suppository (25 mg total) rectally 2 (two) times daily. 02/04/20   Manuella Ghazi, Pratik D, DO  ipratropium (ATROVENT) 0.03 % nasal spray Place 1 spray into both nostrils daily as needed (allergies).  12/23/19   [provider]  isosorbide mononitrate (IMDUR) 60 MG 24 hr tablet Take 60 mg by mouth daily. 02/09/20   [provider]  metoprolol succinate (TOPROL-XL) 25 MG 24 hr tablet Take 0.5 tablets (12.5 mg total) by mouth daily. 01/29/20   Orson Eva, MD  NITROSTAT 0.4 MG SL tablet Place 0.4 mg under the tongue every 5 (five) minutes as needed for chest pain (x 3 tabs daily).  11/15/13   [provider]  ondansetron (ZOFRAN ODT) 4 MG disintegrating tablet Take 1 tablet (  4 mg total) by mouth every 8 (eight) hours as needed for nausea or vomiting. 02/04/20   Manuella Ghazi, Pratik D, DO  Oxycodone HCl 10 MG TABS Take 10 mg by mouth 4 (four) times daily as needed (pain).     [provider]  pantoprazole (PROTONIX) 40 MG tablet Take 40 mg by mouth daily.    [provider]  polyethylene glycol (MIRALAX / GLYCOLAX) 17 g packet Take 25.5 g by mouth daily. 02/11/20   Rehman, Mechele Dawley, MD  potassium chloride (KLOR-CON) 10 MEQ tablet Take 1 tablet (10 mEq total) by mouth 2 (two) times daily. 02/04/20 03/05/20  Manuella Ghazi, Pratik D, DO  psyllium (METAMUCIL SMOOTH TEXTURE) 58.6 % powder Take 1 packet by mouth at bedtime. 02/11/20   Rogene Houston, MD  spironolactone (ALDACTONE) 25 MG tablet Take 1 tablet (25 mg total) by mouth daily. 02/04/20 03/05/20  Heath Lark D, DO    Physical Exam: Vitals:   02/13/20 1431 02/13/20 1715  BP: (!) 119/56 119/70  Pulse: (!) 103 (!) 117  Resp: 18 (!) 21  Temp: 97.9 F (36.6 C)   TempSrc: Oral   SpO2: 96% 94%    Constitutional: NAD, calm, comfortable Vitals:   02/13/20 1431 02/13/20 1715  BP: (!) 119/56 119/70  Pulse: (!) 103 (!) 117  Resp: 18 (!) 21  Temp: 97.9 F (36.6 C)   TempSrc: Oral   SpO2: 96% 94%   Eyes: PERRL, lids and conjunctivae normal ENMT: Mucous membranes are moist.  Neck: normal, supple, no masses, no thyromegaly Respiratory: clear to auscultation bilaterally, no wheezing, no crackles. Normal respiratory effort. No accessory  muscle use.  Cardiovascular: Regular rate and rhythm, 2 + pitting extremity edema to knees bilaterally, blister on right LE and weeping. 2+ pedal pulses. No carotid bruits.  Abdomen: soft, no tenderness, no masses palpated. No hepatosplenomegaly. Bowel sounds positive.  Musculoskeletal: no clubbing / cyanosis. No joint deformity upper and lower extremities. Good ROM, no contractures. Normal muscle tone.  Skin: no rashes, lesions, ulcers. No induration Neurologic: No apparent cranial abnormality, moving extremities spontaneously Psychiatric: Normal judgment and insight. Alert and oriented x 3. Normal mood.   Labs on Admission: I have personally reviewed following labs and imaging studies  CBC: Recent Labs  Lab 02/13/20 1637  WBC 5.8  HGB 12.0  HCT 36.5  MCV 97.6  PLT 49*   Basic Metabolic Panel: Recent Labs  Lab 02/13/20 1637  NA 137  K 2.8*  CL 104  CO2 24  GLUCOSE 63*  BUN 16  CREATININE 1.04*  CALCIUM 8.9   GFR: Estimated Creatinine Clearance: 50.7 mL/min (A) (by C-G formula based on SCr of 1.04 mg/dL (H)). Liver Function Tests: Recent Labs  Lab 02/13/20 1637  AST 58*  ALT 26  ALKPHOS 83  BILITOT 5.2*  PROT 6.8  ALBUMIN 2.7*   Recent Labs  Lab 02/13/20 1637  LIPASE 44   No results for input(s): AMMONIA in the last 168 hours. Coagulation Profile: Recent Labs  Lab 02/13/20 1637  INR 1.6*   Radiological Exams on Admission: CT Lumbar Spine Wo Contrast  Result Date: 02/13/2020 CLINICAL DATA:  Low back pain, trauma, fall into low back. Acute midline tenderness. EXAM: CT LUMBAR SPINE WITHOUT CONTRAST TECHNIQUE: Multidetector CT imaging of the lumbar spine was performed without intravenous contrast administration. Multiplanar CT image reconstructions were also generated. COMPARISON:  Reformats from abdominal CT 01/24/2020, report from lumbar spine MRI 12/12/2017, images not available FINDINGS: Segmentation: 5 lumbar type vertebrae.  Alignment: Normal. Vertebrae:  Chronic L1 compression fracture, unchanged from prior exam, with vertebral plasty. No interval progression. The remaining vertebral body heights are normal. No acute fracture. Posterior elements are intact. Prior laminectomies at L4 and L5. Paraspinal and other soft tissues: Small amount of cement in the left paraspinal musculature adjacent to L1 from prior vertebroplasty. Otherwise negative. Disc levels: Interbody spacer at L4-L5 which is slightly posteriorly positioned, also described on prior exam. Interbody spacer at L5-S1 appear seated. Disc space narrowing at L1-L2. There is mild multilevel facet hypertrophy. IMPRESSION: 1. No acute fracture or subluxation of the lumbar spine. 2. Chronic L1 compression fracture with vertebral augmentation, unchanged from prior exam. 3. Postsurgical change at L4-L5 and L5-S1 with interbody spacers. L4-L5 interbody cages slightly posteriorly positions, also described on prior MRI and likely chronic. Electronically Signed   By: Keith Rake M.D.   On: 02/13/2020 17:15    EKG: Rate is 107, rhythm regular, but P waves not present.  Sinus or ectopic tachycardia versus atrial flutter.  QTc prolonged at 572.  Assessment/Plan Active Problems:   CAD (coronary artery disease)   Essential hypertension   S/P CABG x 3- 2001   Cirrhosis, non-alcoholic (HCC)   Cardiomyopathy, ischemic-35-40% by cath    Persistent atrial fibrillation   Chronic combined systolic and diastolic CHF (congestive heart failure) (HCC)   Paroxysmal atrial fibrillation (H[facility])   Volume overload  Volume overload-likely combination of both nonalcoholic fatty liver disease and congestive heart failure. Per charts weights have trended up since November 2021- 160Lbs, to 183 Lbs today.  - EDP talked to Dr. Laural Golden, recommended IV albumin 50 g daily for 3 to 5 days, with Lasix -Continue IV Lasix 40 twice daily -Strict input output, daily weights - Fluid restriction to less than 1.2 L daily  -Bilateral  venous Dopplers done 11/26 were negative for DVT  Nonalcoholic fatty liver disease, with thrombocytopenia, esophageal varices- platelets 49, Albumin 2.7.  No obvious sign of bleeding. -IV Lasix -Resume spironolactone -CBC a.m.  Decompensated systolic and diastolic CHF-BNP 710, consistent with recent prior.  Reports some dyspnea.  Recent echo 11/26 EF 35 to 40%, global hypokinesis, LV dysfunction not evaluated. - Obtain chest x-ray - Obtain EKG  -IV Lasix -Will consult cardiology to assist with diuresis  Hypokalemia-potassium 2.8.  Likely from diuretics. -Check magnesium - Replete -Resume potassium supplementation in a.m.  Atrial fibrillation-heart rate 97 to low 100s. -Blood pressure now soft Systolic 99, hold metoprolol for now. -not a candidate for [facility] due to cirrhosis, esophageal varices, severe thrombocytopenia  Prolonged QTC 572.  Multiple prior EKGs this year show similarly prolonged Qtc. -Check magnesium - replete potassium  Fall, sustaining back pain.  CT lumbar spine without acute abnormality, shows chronic L1 fracture with vertebral augmentation unchanged from prior. - Flexeril PRN.   Essential hypertension-blood pressure currently soft systolic 99 -Hold metoprolol and Imdur for now.  Hyperlipidemia -Continue Zetia, statin not listed on med list  Anxiety/depression -Continue fluoxetine  Coronary artery disease, history of CABG-stable no chest pains.  EKG without significant ST or T wave changes. -Resume Imdur  - Vaccinated, patient requesting  Moderna booster - Ordered.    DVT prophylaxis: SCDs Code Status: Full code Family Communication: Sister in Sports coach at bedside. Disposition Plan: > 2 days Consults called: Cardiology Admission status: Inpt, tele I certify that at the point of admission it is my clinical judgment that the patient will require inpatient hospital care spanning beyond 2 midnights from the point of admission  due to high intensity of  service, high risk for further deterioration and high frequency of surveillance required. The following factors support the patient status of inpatient: Volume overload requiring diuresis and close monitoring.   Bethena Roys MD [facility]  02/13/2020, 7:10 PM

## 2020-02-13 NOTE — ED Notes (Signed)
Pt given dinner tray and beverage

## 2020-02-13 NOTE — ED Triage Notes (Signed)
Swelling of both legs, unable to ambulate

## 2020-02-14 DIAGNOSIS — R188 Other ascites: Secondary | ICD-10-CM

## 2020-02-14 DIAGNOSIS — I4819 Other persistent atrial fibrillation: Secondary | ICD-10-CM

## 2020-02-14 DIAGNOSIS — R601 Generalized edema: Secondary | ICD-10-CM

## 2020-02-14 DIAGNOSIS — K7460 Unspecified cirrhosis of liver: Secondary | ICD-10-CM

## 2020-02-14 MED ORDER — HYDROMORPHONE HCL 1 MG/ML IJ SOLN
0.5000 mg | INTRAMUSCULAR | Status: DC | PRN
  Administered 2020-02-14 – 2020-02-17 (×20): 0.5 mg via INTRAVENOUS
  Filled 2020-02-14 (×20): qty 0.5

## 2020-02-14 MED ORDER — POTASSIUM CHLORIDE CRYS ER 20 MEQ PO TBCR
20.0000 meq | EXTENDED_RELEASE_TABLET | Freq: Two times a day (BID) | ORAL | Status: DC
  Administered 2020-02-14 – 2020-02-16 (×4): 20 meq via ORAL
  Filled 2020-02-14 (×4): qty 1

## 2020-02-14 NOTE — Progress Notes (Addendum)
PROGRESS NOTE  Samantha [name] XLK:[id number] DOB: 17-Oct-1950 DOA: 02/13/2020 PCP: [name], MD  Brief History:  69 y/o femalewith a history of systolic and diastolic CHF,NASHliver cirrhosis, CKD stage III, hypertension, hyperlipidemia, pancytopenia, paroxysmal atrial fibrillation presenting withworsening lower extremity edema for a period of 2 to 3 weeks.  The patient was recently mated to the hospital from 01/23/2020 to 01/28/2020 for abdominal pain and vomiting.  During that hospital admission, the patient's torsemide was decreased back to her original dose of 20 mg daily.  Since that admission, the patient had complained of gradual worsening of her lower extremity edema.  She was admitted again from 02/02/2020 to 02/04/2020 for worsening lower extremity edema.  Her torsemide was increased to 40 mg daily at that time.  The patient continued to have worsening lower extremity edema.  She endorses compliance with fluid intake as well as watching her sodium intake.  She endorses compliance with her medications.  She went to see her GI physician, Dr. Laural Golden, on 02/11/2020.  She was instructed to discontinue her torsemide and start on furosemide 40 mg p.o. twice daily.  She has noted increasing urine output, but continued to have worsening lower extremity edema.  She states that her breathing is a little worse than usual with some dyspnea on exertion.  She denies any fevers, chills, chest pain, coughing, hemoptysis, nausea, vomiting, diarrhea, abdominal pain. Because of her worsening lower extremity edema, the patient has had difficulty ambulating.  She had a mechanical fall on 02/13/2020.  She she had difficulty getting up.  She continued to complain of her back pain which is worse after her fall.  Given the culmination of events, the patient presented for further evaluation and treatment. In the emergency department, the patient was afebrile hemodynamically stable with oxygen saturation  97-90% room air.  BMP was essentially unremarkable except for potassium 2.8.  AST 58, A LT 26, alkaline phosphatase 83, total bilirubin 5.2, albumin 2.7.  CBC was unremarkable with her chronic thrombocytopenia.  Platelets are 49,000.  CT of the lumbar spine was negative for any fracture or dislocation.  Chest x-ray showed increased interstitial markings which are largely unchanged.  The patient was started on intravenous furosemide.  Assessment/Plan: Decompensated liver cirrhosis/Anasarca -This is the primary driving factor for the patient's worsening lower extremity edema and anasarca -Certainly, there may be a component of cor pulmonale, CHF -01/24/2020 echo did show a decrease from her previous EF -01/24/2020 echo EF 35-40%, global HK, severe HK of the inferior lateral wall, mild decreased RV function, moderate MR, mild increase PASP -05/20/2019 echo EF 50-55% -Continue IV furosemide -Her diuresis would be limited by her soft blood pressures as her preload will significantly decrease with diuresis -I had an extensive goals of care discussion with the patient--> changed to DNR -Holding spironolactone temporarily for now  chronic systolic and diastolic CHF -Certainly, there may be some contribution from the patient's decreased EF, but primary driving factors for decompensated liver cirrhosis -01/24/2020 echo as discussed above -holding metoprolol succinate again due to soft BPs  Persistent atrial fibrillation -Currently in sinus rhythm -not a candidate for [facility] due to cirrhosis, esophageal varices, thrombocytopenia  CKD 3a -baseline creatinine 0.9-1.1 -serum creatinine 1.06 on day of d/c  Coronary artery disease, history of CABG-stable no chest pains. -personally reviewed EKG--no concerning STT wave changes. -Resume Imdur if BP allows  Hypokalemia -Repleted -Check magnesium--2.0  Hyperlipidemia -Continue Zetiaand statin when able to  tolerate po  Pancytopenia -Secondary to liver  cirrhosis  Anxiety/depression -Continue fluoxetinewhen able to tolerate po  Back pain -12/16 CT L-spine--neg for fracture or subluxation -IV dilaudid PRN  Lung Nodule -incidential finding of LLL--12m -outpatient surveillance  Goals of Care -Advance care planning, including the explanation and discussion of advance directives was carried out with the patient and family.  Code status including explanations of "Full Code" and "DNR" and alternatives were discussed in detail.  Discussion of end-of-life issues including but not limited palliative care, hospice care and the concept of hospice, other end-of-life care options, power of attorney for health care decisions, living wills, and physician orders for life-sustaining treatment were also discussed with the patient and family.  Total face to face time 16 minutes. -confirmed DNR with patient       Status is: Inpatient  Remains inpatient appropriate because:IV treatments appropriate due to intensity of illness or inability to take PO   Dispo: The patient is from: Home              Anticipated d/c is to: Home              Anticipated d/c date is: 3 days              Patient currently is not medically stable to d/c.        Family Communication:   No Family at bedside  Consultants:  none  Code Status:  DNR  DVT Prophylaxis:  SCDs   Procedures: As Listed in Progress Note Above  Antibiotics: None      Subjective: Patient denies fevers, chills, headache, chest pain, dyspnea, nausea, vomiting, diarrhea, abdominal pain, dysuria, hematuria, hematochezia, and melena.   Objective: Vitals:   02/14/20 0020 02/14/20 0425 02/14/20 0439 02/14/20 0500  BP: 110/60 (!) 100/53    Pulse: 86 (!) 106 96   Resp: 18 19    Temp: 98 F (36.7 C) 98.2 F (36.8 C)    TempSrc:      SpO2: 98% 97%    Weight:    87.4 kg  Height:        Intake/Output Summary (Last 24 hours) at 02/14/2020 0847 Last data filed at 02/14/2020  0500 Gross per 24 hour  Intake 0 ml  Output 100 ml  Net -100 ml   Weight change:  Exam:   General:  Pt is alert, follows commands appropriately, not in acute distress  HEENT: No icterus, No thrush, No neck mass, [place]/AT  Cardiovascular: RRR, S1/S2, no rubs, no gallops  Respiratory: bibasilar crackles. No wheeze  Abdomen: Soft/+BS, non tender, non distended, no guarding  Extremities: 3+LE edema, No lymphangitis, No petechiae, No rashes, no synovitis   Data Reviewed: I have personally reviewed following labs and imaging studies Basic Metabolic Panel: Recent Labs  Lab 02/13/20 1637  NA 137  K 2.8*  CL 104  CO2 24  GLUCOSE 63*  BUN 16  CREATININE 1.04*  CALCIUM 8.9  MG 2.1   Liver Function Tests: Recent Labs  Lab 02/13/20 1637  AST 58*  ALT 26  ALKPHOS 83  BILITOT 5.2*  PROT 6.8  ALBUMIN 2.7*   Recent Labs  Lab 02/13/20 1637  LIPASE 44   No results for input(s): AMMONIA in the last 168 hours. Coagulation Profile: Recent Labs  Lab 02/13/20 1637  INR 1.6*   CBC: Recent Labs  Lab 02/13/20 1637  WBC 5.8  HGB 12.0  HCT 36.5  MCV 97.6  PLT 49*  Cardiac Enzymes: No results for input(s): CKTOTAL, CKMB, CKMBINDEX, TROPONINI in the last 168 hours. BNP: Invalid input(s): POCBNP CBG: Recent Labs  Lab 02/13/20 2247  GLUCAP 130*   HbA1C: No results for input(s): HGBA1C in the last 72 hours. Urine analysis:    Component Value Date/Time   COLORURINE YELLOW 05/19/2019 2015   APPEARANCEUR HAZY (A) 05/19/2019 2015   LABSPEC 1.013 05/19/2019 2015   PHURINE 5.0 05/19/2019 2015   GLUCOSEU NEGATIVE 05/19/2019 2015   HGBUR NEGATIVE 05/19/2019 2015   [place] NEGATIVE 05/19/2019 2015   KETONESUR NEGATIVE 05/19/2019 2015   PROTEINUR NEGATIVE 05/19/2019 2015   NITRITE NEGATIVE 05/19/2019 2015   LEUKOCYTESUR NEGATIVE 05/19/2019 2015   Sepsis Labs: @LABRCNTIP (procalcitonin:4,lacticidven:4) ) Recent Results (from the past 240 hour(s))  Resp Panel  by RT-PCR (Flu A&B, Covid) Nasopharyngeal Swab     Status: None   Collection Time: 02/13/20  6:00 PM   Specimen: Nasopharyngeal Swab; Nasopharyngeal(NP) swabs in vial transport medium  Result Value Ref Range Status   SARS Coronavirus 2 by RT PCR NEGATIVE NEGATIVE Final    Comment: (NOTE) SARS-CoV-2 target nucleic acids are NOT DETECTED.  The SARS-CoV-2 RNA is generally detectable in upper respiratory specimens during the acute phase of infection. The lowest concentration of SARS-CoV-2 viral copies this assay can detect is 138 copies/mL. A negative result does not preclude SARS-Cov-2 infection and should not be used as the sole basis for treatment or other patient management decisions. A negative result may occur with  improper specimen collection/handling, submission of specimen other than nasopharyngeal swab, presence of viral mutation(s) within the areas targeted by this assay, and inadequate number of viral copies(<138 copies/mL). A negative result must be combined with clinical observations, patient history, and epidemiological information. The expected result is Negative.  Fact Sheet for Patients:  EntrepreneurPulse.com.au  Fact Sheet for Healthcare Providers:  IncredibleEmployment.be  This test is no t yet approved or cleared by the [place] FDA and  has been authorized for detection and/or diagnosis of SARS-CoV-2 by FDA under an Emergency Use Authorization (EUA). This EUA will remain  in effect (meaning this test can be used) for the duration of the COVID-19 declaration under Section 564(b)(1) of the Act, 21 U.S.C.section 360bbb-3(b)(1), unless the authorization is terminated  or revoked sooner.       Influenza A by PCR NEGATIVE NEGATIVE Final   Influenza B by PCR NEGATIVE NEGATIVE Final    Comment: (NOTE) The Xpert Xpress SARS-CoV-2/FLU/RSV plus assay is intended as an aid in the diagnosis of influenza from Nasopharyngeal swab  specimens and should not be used as a sole basis for treatment. Nasal washings and aspirates are unacceptable for Xpert Xpress SARS-CoV-2/FLU/RSV testing.  Fact Sheet for Patients: EntrepreneurPulse.com.au  Fact Sheet for Healthcare Providers: IncredibleEmployment.be  This test is not yet approved or cleared by the [place] FDA and has been authorized for detection and/or diagnosis of SARS-CoV-2 by FDA under an Emergency Use Authorization (EUA). This EUA will remain in effect (meaning this test can be used) for the duration of the COVID-19 declaration under Section 564(b)(1) of the Act, 21 U.S.C. section 360bbb-3(b)(1), unless the authorization is terminated or revoked.  Performed at [facility], [address] [place], [place] [address]      Scheduled Meds: . COVID-19 mRNA vaccine (Moderna)  0.25 mL Intramuscular Once  . ezetimibe  10 mg Oral QHS  . FLUoxetine  20 mg Oral Daily  . furosemide  40 mg Intravenous Q12H  . pantoprazole  40  mg Oral Daily  . polyethylene glycol  17 g Oral Daily  . potassium chloride  10 mEq Oral BID  . spironolactone  25 mg Oral Daily   Continuous Infusions: . albumin human Stopped (02/13/20 1912)    Procedures/Studies: CT Lumbar Spine Wo Contrast  Result Date: 02/13/2020 CLINICAL DATA:  Low back pain, trauma, fall into low back. Acute midline tenderness. EXAM: CT LUMBAR SPINE WITHOUT CONTRAST TECHNIQUE: Multidetector CT imaging of the lumbar spine was performed without intravenous contrast administration. Multiplanar CT image reconstructions were also generated. COMPARISON:  Reformats from abdominal CT 01/24/2020, report from lumbar spine MRI 12/12/2017, images not available FINDINGS: Segmentation: 5 lumbar type vertebrae. Alignment: Normal. Vertebrae: Chronic L1 compression fracture, unchanged from prior exam, with vertebral plasty. No interval progression. The remaining vertebral body heights are normal.  No acute fracture. Posterior elements are intact. Prior laminectomies at L4 and L5. Paraspinal and other soft tissues: Small amount of cement in the left paraspinal musculature adjacent to L1 from prior vertebroplasty. Otherwise negative. Disc levels: Interbody spacer at L4-L5 which is slightly posteriorly positioned, also described on prior exam. Interbody spacer at L5-S1 appear seated. Disc space narrowing at L1-L2. There is mild multilevel facet hypertrophy. IMPRESSION: 1. No acute fracture or subluxation of the lumbar spine. 2. Chronic L1 compression fracture with vertebral augmentation, unchanged from prior exam. 3. Postsurgical change at L4-L5 and L5-S1 with interbody spacers. L4-L5 interbody cages slightly posteriorly positions, also described on prior MRI and likely chronic. Electronically Signed   By: Keith Rake M.D.   On: 02/13/2020 17:15   CT ABDOMEN PELVIS W CONTRAST  Result Date: 01/24/2020 CLINICAL DATA:  Abdominal pain and fever. EXAM: CT ABDOMEN AND PELVIS WITH CONTRAST TECHNIQUE: Multidetector CT imaging of the abdomen and pelvis was performed using the standard protocol following bolus administration of intravenous contrast. CONTRAST:  10m OMNIPAQUE IOHEXOL 300 MG/ML  SOLN COMPARISON:  CT angiography of the abdomen pelvis 05/23/2019 FINDINGS: Lower chest: Subsegmental atelectasis versus scar noted within the right middle lobe and both posterior lower lobes.6 mm ground-glass nodule is identified within the left lower lobe. This is new when compared with 05/23/2019. Hepatobiliary: Advanced changes of cirrhosis. The liver has a shrunken and nodular appearance. No focal liver abnormality identified. Gallbladder is unremarkable. Tiny stones within the dependent portion of the gallbladder suspected. No signs of biliary ductal dilatation or acute cholecystitis. Pancreas: Unremarkable. No pancreatic ductal dilatation or surrounding inflammatory changes. Spleen: Enlarged measuring 17.5 by 11.3 x  6.5 cm (volume = 670 cm^3) Adrenals/Urinary Tract: Normal appearance of the adrenal glands. No kidney mass or hydronephrosis identified. Urinary bladder is unremarkable. Stomach/Bowel: Stomach appears nondistended. The appendix is visualized and appears normal. No small bowel wall thickening, inflammation or distension. There is mild wall thickening involving the ascending colon, nonspecific in the setting of cirrhosis, ascites and portal venous hypertension. Sigmoid diverticulosis noted without acute inflammation. Vascular/Lymphatic: Aortic atherosclerosis. No aneurysm. Distal esophageal varices noted. There also large gastric and umbilical varices. No abdominal no pelvic adenopathy. Varices. Reproductive: Status post hysterectomy. No adnexal masses. Other: New upper abdominal ascites overlying the spleen and liver. No discrete fluid collections identified. Musculoskeletal: Previous right hip arthroplasty. Status post vertebral augmentation at L1. Interbody spacers are noted at L4-5 and L5-S1. IMPRESSION: 1. No acute findings identified within the abdomen or pelvis. 2. Advanced changes of cirrhosis and portal venous hypertension. New upper abdominal ascites. 3. New 6 mm ground-glass nodule identified within the left lower lobe. Likely postinflammatory Initial follow-up with CT  at 6-12 months is recommended to confirm persistence. If persistent, repeat CT is recommended every 2 years until 5 years of stability has been established. This recommendation follows the consensus statement: Guidelines for Management of Incidental Pulmonary Nodules Detected on CT Images: From the Fleischner Society 2017; Radiology 2017; 284:228-243. 4. Aortic atherosclerosis. Aortic Atherosclerosis (ICD10-I70.0). Electronically Signed   By: Kerby Moors M.D.   On: 01/24/2020 14:14   US Venous Img Lower Bilateral (DVT)  Result Date: 01/24/2020 CLINICAL DATA:  Left leg swelling. EXAM: BILATERAL LOWER EXTREMITY VENOUS DOPPLER ULTRASOUND  TECHNIQUE: Gray-scale sonography with graded compression, as well as color Doppler and duplex ultrasound were performed to evaluate the lower extremity deep venous systems from the level of the common femoral vein and including the common femoral, femoral, profunda femoral, popliteal and calf veins including the posterior tibial, peroneal and gastrocnemius veins when visible. The superficial great saphenous vein was also interrogated. Spectral Doppler was utilized to evaluate flow at rest and with distal augmentation maneuvers in the common femoral, femoral and popliteal veins. COMPARISON:  None. FINDINGS: RIGHT LOWER EXTREMITY Common Femoral Vein: No evidence of thrombus. Normal compressibility, respiratory phasicity and response to augmentation. Saphenofemoral Junction: No evidence of thrombus. Normal compressibility and flow on color Doppler imaging. Profunda Femoral Vein: No evidence of thrombus. Normal compressibility and flow on color Doppler imaging. Femoral Vein: No evidence of thrombus. Normal compressibility, respiratory phasicity and response to augmentation. Popliteal Vein: No evidence of thrombus. Normal compressibility, respiratory phasicity and response to augmentation. Calf Veins: Visualized right deep calf veins are patent without thrombus. Other Findings:  None. LEFT LOWER EXTREMITY Common Femoral Vein: No evidence of thrombus. Normal compressibility, respiratory phasicity and response to augmentation. Saphenofemoral Junction: No evidence of thrombus. Normal compressibility and flow on color Doppler imaging. Profunda Femoral Vein: No evidence of thrombus. Normal compressibility and flow on color Doppler imaging. Femoral Vein: No evidence of thrombus. Normal compressibility, respiratory phasicity and response to augmentation. Popliteal Vein: No evidence of thrombus. Normal compressibility, respiratory phasicity and response to augmentation. Calf Veins: Visualized left deep calf veins are patent  without thrombus. Other Findings:  None. IMPRESSION: No evidence of deep venous thrombosis in either lower extremity. Electronically Signed   By: Markus Daft M.D.   On: 01/24/2020 10:54   DG CHEST PORT 1 VIEW  Result Date: 02/13/2020 CLINICAL DATA:  Bilateral leg swelling. EXAM: PORTABLE CHEST 1 VIEW COMPARISON:  02/02/2020 FINDINGS: Post median sternotomy. The lowest most sternal wire is broken, chronic. Cardiomegaly is stable. Vascular congestion. Possible mild pulmonary edema with suspected basilar septal thickening. Multifocal linear atelectasis or scarring. No confluent consolidation. No pleural fluid. No pneumothorax. No acute osseous abnormalities are seen. IMPRESSION: 1. Cardiomegaly with vascular congestion and possible mild pulmonary edema. 2. Multifocal linear atelectasis or scarring. Electronically Signed   By: Keith Rake M.D.   On: 02/13/2020 18:53   DG Chest Portable 1 View  Result Date: 02/02/2020 CLINICAL DATA:  Volume overload, lower extremity edema EXAM: PORTABLE CHEST 1 VIEW COMPARISON:  01/23/2020 FINDINGS: Single frontal view of the chest demonstrates stable enlarged cardiac silhouette. Postsurgical changes are seen from median sternotomy. There is chronic central vascular congestion without airspace disease, effusion, or pneumothorax. IMPRESSION: 1. Central vascular congestion without edema. Electronically Signed   By: Randa Ngo M.D.   On: 02/02/2020 23:09   DG Chest Portable 1 View  Result Date: 01/23/2020 CLINICAL DATA:  Chest pain EXAM: PORTABLE CHEST 1 VIEW COMPARISON:  05/23/2019, 04/17/2018, 08/14/2016 CT 05/17/2019 FINDINGS: Post  sternotomy changes. Cardiomegaly with aortic atherosclerosis. Chronic interstitial thickening. No consolidation or effusion. No pneumothorax. IMPRESSION: Cardiomegaly without overt pulmonary edema, pleural effusion or focal airspace disease. Electronically Signed   By: Donavan Foil M.D.   On: 01/23/2020 19:38   ECHOCARDIOGRAM  COMPLETE  Result Date: 01/24/2020    ECHOCARDIOGRAM REPORT   Patient Name:   MARTA BOUIE Date of Exam: 01/24/2020 Medical Rec #:  [id number]      Height:       62.0 in Accession #:    [id number]     Weight:       172.6 lb Date of Birth:  1950/09/17      BSA:          1.796 m Patient Age:    34 years       BP:           107/59 mmHg Patient Gender: F              HR:           94 bpm. Exam Location:  [facility] Procedure: 2D Echo, Cardiac Doppler and Color Doppler Indications:    Chest Pain 786.50 / R07.9  History:        Patient has prior history of Echocardiogram examinations. CHF,                 CAD, Prior CABG, Arrythmias:Atrial Fibrillation; Risk                 Factors:Hypertension and Dyslipidemia. GERD, Obesity.  Sonographer:    Alvino Chapel RCS Referring Phys: [id number] Gery Sabedra IMPRESSIONS  1. Left ventricular ejection fraction, by estimation, is 35 to 40%. The left ventricle has moderately decreased function. The left ventricle demonstrates global hypokinesis. Left ventricular diastolic function could not be evaluated. There is severe hypokinesis of the left ventricular, entire inferior wall and inferolateral wall.  2. Right ventricular systolic function is mildly reduced. The right ventricular size is normal. There is mildly elevated pulmonary artery systolic pressure.  3. Left atrial size was severely dilated.  4. Right atrial size was severely dilated.  5. The mitral valve is normal in structure. Mild to moderate mitral valve regurgitation.  6. Tricuspid valve regurgitation is moderate.  7. The aortic valve is tricuspid. Aortic valve regurgitation is not visualized. Mild aortic valve sclerosis is present, with no evidence of aortic valve stenosis.  8. The inferior vena cava is normal in size with <50% respiratory variability, suggesting right atrial pressure of 8 mmHg. Comparison(s): A prior study was performed on 05/20/2019. The left ventricular function is worsened. The left ventricular wall motion  abnormalities are worse. FINDINGS  Left Ventricle: Left ventricular ejection fraction, by estimation, is 35 to 40%. The left ventricle has moderately decreased function. The left ventricle demonstrates global hypokinesis. Severe hypokinesis of the left ventricular, entire inferior wall and inferolateral wall. The left ventricular internal cavity size was normal in size. There is no left ventricular hypertrophy. Abnormal (paradoxical) septal motion consistent with post-operative status. Left ventricular diastolic function could not be evaluated due to atrial fibrillation. Left ventricular diastolic function could not be evaluated. Right Ventricle: The right ventricular size is normal. No increase in right ventricular wall thickness. Right ventricular systolic function is mildly reduced. There is mildly elevated pulmonary artery systolic pressure. The tricuspid regurgitant velocity  is 2.75 m/s, and with an assumed right atrial pressure of 8 mmHg, the estimated right ventricular systolic pressure is 27.2 mmHg. Left Atrium: Left  atrial size was severely dilated. Right Atrium: Right atrial size was severely dilated. Pericardium: There is no evidence of pericardial effusion. Mitral Valve: The mitral valve is normal in structure. Mild mitral annular calcification. Mild to moderate mitral valve regurgitation. Tricuspid Valve: The tricuspid valve is normal in structure. Tricuspid valve regurgitation is moderate. Aortic Valve: The aortic valve is tricuspid. Aortic valve regurgitation is not visualized. Mild aortic valve sclerosis is present, with no evidence of aortic valve stenosis. Pulmonic Valve: The pulmonic valve was normal in structure. Pulmonic valve regurgitation is not visualized. Aorta: The aortic root and ascending aorta are structurally normal, with no evidence of dilitation. Venous: The inferior vena cava is normal in size with less than 50% respiratory variability, suggesting right atrial pressure of 8 mmHg.  IAS/Shunts: No atrial level shunt detected by color flow Doppler.  LEFT VENTRICLE PLAX 2D LVIDd:         5.00 cm  Diastology LV PW:         1.10 cm  LV e' medial:    9.57 cm/s LV IVS:        1.00 cm  LV E/e' medial:  16.1 LVOT diam:     1.80 cm  LV e' lateral:   14.30 cm/s LV SV:         56       LV E/e' lateral: 10.8 LV SV Index:   31 LVOT Area:     2.54 cm  RIGHT VENTRICLE RV S prime:     9.25 cm/s TAPSE (M-mode): 2.0 cm LEFT ATRIUM             Index       RIGHT ATRIUM           Index LA Vol (A2C):   86.2 ml 48.00 ml/m RA Area:     23.80 cm LA Vol (A4C):   81.9 ml 45.61 ml/m RA Volume:   75.40 ml  41.99 ml/m LA Biplane Vol: 85.3 ml 47.50 ml/m  AORTIC VALVE LVOT Vmax:   99.20 cm/s LVOT Vmean:  64.300 cm/s LVOT VTI:    0.221 m  AORTA Ao Root diam: 4.90 cm MITRAL VALVE                TRICUSPID VALVE MV Area (PHT): 3.97 cm     TR Peak grad:   30.2 mmHg MV Decel Time: 191 msec     TR Vmax:        275.00 cm/s MV E velocity: 154.00 cm/s                             SHUNTS                             Systemic VTI:  0.22 m                             Systemic Diam: 1.80 cm Dani Gobble Croitoru MD Electronically signed by Sanda Klein MD Signature Date/Time: 01/24/2020/4:45:09 PM    Final    US LIVER DOPPLER  Result Date: 01/27/2020 CLINICAL DATA:  Cirrhosis. EXAM: DUPLEX ULTRASOUND OF LIVER TECHNIQUE: Color and duplex Doppler ultrasound was performed to evaluate the hepatic in-flow and out-flow vessels. COMPARISON:  CT abdomen 01/24/2020 FINDINGS: Liver: Liver is slightly heterogeneous with a nodular contour. Findings are compatible with cirrhosis. No discrete liver lesion.  Small amount of perihepatic ascites. Main Portal Vein size: 1.5 cm Portal Vein Velocities Main Prox:  32 cm/sec Main Mid: 26 cm/sec Main Dist:  25 cm/sec Right: 20 cm/sec Left: 80 cm/sec Hepatic Vein Velocities Right:  49 cm/sec Middle:  70 cm/sec Left:  69 cm/sec IVC: Present and patent with normal respiratory phasicity. Hepatic Artery Velocity:   109 cm/sec Splenic Vein Velocity:  24 cm/sec Spleen: 15.8 cm x 15.7 cm x 7.6 cm with a total volume of 991 cm^3 (411 cm^3 is upper limit normal) Portal Vein Occlusion/Thrombus: No Splenic Vein Occlusion/Thrombus: No Ascites: Present Varices: Present Normal hepatofugal flow in the hepatic veins. Normal hepatopetal flow in the portal veins. Enlarged varices at the umbilicus and this corresponds with the previous CT findings. IMPRESSION: 1. 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 2. Portal venous system is patent with normal direction of flow. Electronically Signed   By: Markus Daft M.D.   On: 01/27/2020 13:52    Orson Eva, DO  [facility]  If 7PM-7AM, please contact night-coverage [email or web address] Password [email or web address] 02/14/2020, 8:47 AM   LOS: 1 day

## 2020-02-15 DIAGNOSIS — I255 Ischemic cardiomyopathy: Secondary | ICD-10-CM

## 2020-02-15 DIAGNOSIS — E876 Hypokalemia: Secondary | ICD-10-CM

## 2020-02-15 LAB — CBC
HCT: 31.8 % — ABNORMAL LOW (ref 36.0–46.0)
Hemoglobin: 10.3 g/dL — ABNORMAL LOW (ref 12.0–15.0)
MCH: 32.6 pg (ref 26.0–34.0)
MCHC: 32.4 g/dL (ref 30.0–36.0)
MCV: 100.6 fL — ABNORMAL HIGH (ref 80.0–100.0)
Platelets: 41 10*3/uL — ABNORMAL LOW (ref 150–400)
RBC: 3.16 MIL/uL — ABNORMAL LOW (ref 3.87–5.11)
RDW: 18.6 % — ABNORMAL HIGH (ref 11.5–15.5)
WBC: 4.7 10*3/uL (ref 4.0–10.5)
nRBC: 0 % (ref 0.0–0.2)

## 2020-02-15 LAB — MAGNESIUM: Magnesium: 1.9 mg/dL (ref 1.7–2.4)

## 2020-02-15 LAB — COMPREHENSIVE METABOLIC PANEL
ALT: 21 U/L (ref 0–44)
AST: 53 U/L — ABNORMAL HIGH (ref 15–41)
Albumin: 3 g/dL — ABNORMAL LOW (ref 3.5–5.0)
Alkaline Phosphatase: 69 U/L (ref 38–126)
Anion gap: 6 (ref 5–15)
BUN: 17 mg/dL (ref 8–23)
CO2: 24 mmol/L (ref 22–32)
Calcium: 8.7 mg/dL — ABNORMAL LOW (ref 8.9–10.3)
Chloride: 104 mmol/L (ref 98–111)
Creatinine, Ser: 1.06 mg/dL — ABNORMAL HIGH (ref 0.44–1.00)
GFR, Estimated: 56 mL/min — ABNORMAL LOW (ref >60)
Glucose, Bld: 104 mg/dL — ABNORMAL HIGH (ref 70–99)
Potassium: 4.3 mmol/L (ref 3.5–5.1)
Sodium: 134 mmol/L — ABNORMAL LOW (ref 135–145)
Total Bilirubin: 4.7 mg/dL — ABNORMAL HIGH (ref 0.3–1.2)
Total Protein: 6.2 g/dL — ABNORMAL LOW (ref 6.5–8.1)

## 2020-02-15 LAB — GLUCOSE, CAPILLARY: Glucose-Capillary: 124 mg/dL — ABNORMAL HIGH (ref 70–99)

## 2020-02-15 MED ORDER — FUROSEMIDE 10 MG/ML IJ SOLN
60.0000 mg | Freq: Two times a day (BID) | INTRAMUSCULAR | Status: DC
  Administered 2020-02-15 – 2020-02-19 (×8): 60 mg via INTRAVENOUS
  Filled 2020-02-15 (×9): qty 6

## 2020-02-15 MED ORDER — FUROSEMIDE 10 MG/ML IJ SOLN: 60.0000 mg | Freq: Two times a day (BID) | INTRAMUSCULAR | Status: DC

## 2020-02-15 NOTE — Progress Notes (Signed)
PROGRESS NOTE  Samantha [name] XAJ:[id number] DOB: February 10, 1951 DOA: 02/13/2020 PCP: [name], MD  Brief History:  69 y/o femalewith a history of systolic and diastolic CHF,NASHliver cirrhosis, CKD stage III, hypertension, hyperlipidemia, pancytopenia, paroxysmal atrial fibrillation presenting withworsening lower extremity edema for a period of 2 to 3 weeks.  The patient was recently mated to the hospital from 01/23/2020 to 01/28/2020 for abdominal pain and vomiting.  During that hospital admission, the patient's torsemide was decreased back to her original dose of 20 mg daily.  Since that admission, the patient had complained of gradual worsening of her lower extremity edema.  She was admitted again from 02/02/2020 to 02/04/2020 for worsening lower extremity edema.  Her torsemide was increased to 40 mg daily at that time.  The patient continued to have worsening lower extremity edema.  She endorses compliance with fluid intake as well as watching her sodium intake.  She endorses compliance with her medications.  She went to see her GI physician, Dr. Laural Golden, on 02/11/2020.  She was instructed to discontinue her torsemide and start on furosemide 40 mg p.o. twice daily.  She has noted increasing urine output, but continued to have worsening lower extremity edema.  She states that her breathing is a little worse than usual with some dyspnea on exertion.  She denies any fevers, chills, chest pain, coughing, hemoptysis, nausea, vomiting, diarrhea, abdominal pain. Because of her worsening lower extremity edema, the patient has had difficulty ambulating.  She had a mechanical fall on 02/13/2020.  She she had difficulty getting up.  She continued to complain of her back pain which is worse after her fall.  Given the culmination of events, the patient presented for further evaluation and treatment. In the emergency department, the patient was afebrile hemodynamically stable with oxygen saturation  97-90% room air.  BMP was essentially unremarkable except for potassium 2.8.  AST 58, A LT 26, alkaline phosphatase 83, total bilirubin 5.2, albumin 2.7.  CBC was unremarkable with her chronic thrombocytopenia.  Platelets are 49,000.  CT of the lumbar spine was negative for any fracture or dislocation.  Chest x-ray showed increased interstitial markings which are largely unchanged.  The patient was started on intravenous furosemide.  Assessment/Plan: Decompensated liver cirrhosis/Anasarca -This is the primary driving factor for the patient's worsening lower extremity edema and anasarca -Certainly, there may be a component of cor pulmonale, CHF -01/24/2020 echo did show a decrease from her previous EF -01/24/2020 echo EF 35-40%, global HK, severe HK of the inferior lateral wall, mild decreased RV function, moderate MR, mild increase PASP -05/20/2019 echo EF 50-55% -Continue IV furosemide-->increase to 60 mg bid IV -Her diuresis would be limited by her soft blood pressures as her preload will significantly decrease with diuresis -I had an extensive goals of care discussion with the patient--> changed to DNR -continue spironolactone  chronic systolic and diastolic CHF -Certainly, there may be some contribution from the patient's decreased EF, but primary driving factors for decompensated liver cirrhosis -01/24/2020 echo as discussed above -holding metoprolol succinate again due to soft BPs  Persistent atrial fibrillation -Currently in sinus rhythm -not a candidate for [facility] due to cirrhosis, esophageal varices, thrombocytopenia  CKD 3a -baseline creatinine 0.9-1.1 -serum creatinine 1.06 on day of d/c  Coronary artery disease, history of CABG-stable no chest pains. -personally reviewed EKG--no concerning STT wave changes. -Resume Imdur if BP allows  Hypokalemia -Repleted -Check magnesium--2.0  Hyperlipidemia -Continue Zetiaand statin when  able to tolerate  po  Pancytopenia -Secondary to liver cirrhosis  Anxiety/depression -Continue fluoxetinewhen able to tolerate po  Back pain -12/16 CT L-spine--neg for fracture or subluxation -IV dilaudid PRN  Lung Nodule -incidential finding of LLL--60m -outpatient surveillance  Goals of Care -Advance care planning, including the explanation and discussion of advance directives was carried out with the patient and family.  Code status including explanations of "Full Code" and "DNR" and alternatives were discussed in detail.  Discussion of end-of-life issues including but not limited palliative care, hospice care and the concept of hospice, other end-of-life care options, power of attorney for health care decisions, living wills, and physician orders for life-sustaining treatment were also discussed with the patient and family.  Total face to face time 16 minutes. -confirmed DNR with patient       Status is: Inpatient  Remains inpatient appropriate because:IV treatments appropriate due to intensity of illness or inability to take PO   Dispo: The patient is from: Home  Anticipated d/c is to: Home  Anticipated d/c date is: 3 days  Patient currently is not medically stable to d/c.        Family Communication:   No Family at bedside  Consultants:  none  Code Status:  DNR  DVT Prophylaxis:  SCDs   Procedures: As Listed in Progress Note Above  Antibiotics: None      Subjective: Patient states leg pain and edema are not much better.  Patient denies fevers, chills, headache, chest pain, dyspnea, nausea, vomiting, diarrhea, abdominal pain, dysuria, hematuria, hematochezia, and melena.   Objective: Vitals:   02/14/20 1406 02/14/20 2049 02/15/20 0355 02/15/20 0500  BP: (!) 100/47 (!) 117/57 (!) 109/43   Pulse: 96 (!) 108 (!) 107   Resp: 18 19 18    Temp: 97.7 F (36.5 C) 98.1 F (36.7 C) 98.4 F (36.9 C)    TempSrc: Oral     SpO2:  92% 91%   Weight:    87.5 kg  Height:        Intake/Output Summary (Last 24 hours) at 02/15/2020 1402 Last data filed at 02/14/2020 1800 Gross per 24 hour  Intake --  Output 500 ml  Net -500 ml   Weight change: 0.2 kg Exam:   General:  Pt is alert, follows commands appropriately, not in acute distress  HEENT: No icterus, No thrush, No neck mass, [place]/AT  Cardiovascular: RRR, S1/S2, no rubs, no gallops  Respiratory: bibsailar crackles. No wheeze  Abdomen: Soft/+BS, non tender, non distended, no guarding  Extremities: 3+LE edema, No lymphangitis, No petechiae, No rashes, no synovitis   Data Reviewed: I have personally reviewed following labs and imaging studies Basic Metabolic Panel: Recent Labs  Lab 02/13/20 1637 02/15/20 0443  NA 137 134*  K 2.8* 4.3  CL 104 104  CO2 24 24  GLUCOSE 63* 104*  BUN 16 17  CREATININE 1.04* 1.06*  CALCIUM 8.9 8.7*  MG 2.1 1.9   Liver Function Tests: Recent Labs  Lab 02/13/20 1637 02/15/20 0443  AST 58* 53*  ALT 26 21  ALKPHOS 83 69  BILITOT 5.2* 4.7*  PROT 6.8 6.2*  ALBUMIN 2.7* 3.0*   Recent Labs  Lab 02/13/20 1637  LIPASE 44   No results for input(s): AMMONIA in the last 168 hours. Coagulation Profile: Recent Labs  Lab 02/13/20 1637  INR 1.6*   CBC: Recent Labs  Lab 02/13/20 1637 02/15/20 0443  WBC 5.8 4.7  HGB 12.0 10.3*  HCT 36.5 31.8*  MCV 97.6 100.6*  PLT 49* 41*   Cardiac Enzymes: No results for input(s): CKTOTAL, CKMB, CKMBINDEX, TROPONINI in the last 168 hours. BNP: Invalid input(s): POCBNP CBG: Recent Labs  Lab 02/13/20 2247  GLUCAP 130*   HbA1C: No results for input(s): HGBA1C in the last 72 hours. Urine analysis:    Component Value Date/Time   COLORURINE YELLOW 05/19/2019 2015   APPEARANCEUR HAZY (A) 05/19/2019 2015   LABSPEC 1.013 05/19/2019 2015   PHURINE 5.0 05/19/2019 2015   GLUCOSEU NEGATIVE 05/19/2019 2015   HGBUR NEGATIVE 05/19/2019 2015    [place] NEGATIVE 05/19/2019 2015   KETONESUR NEGATIVE 05/19/2019 2015   PROTEINUR NEGATIVE 05/19/2019 2015   NITRITE NEGATIVE 05/19/2019 2015   LEUKOCYTESUR NEGATIVE 05/19/2019 2015   Sepsis Labs: @LABRCNTIP (procalcitonin:4,lacticidven:4) ) Recent Results (from the past 240 hour(s))  Resp Panel by RT-PCR (Flu A&B, Covid) Nasopharyngeal Swab     Status: None   Collection Time: 02/13/20  6:00 PM   Specimen: Nasopharyngeal Swab; Nasopharyngeal(NP) swabs in vial transport medium  Result Value Ref Range Status   SARS Coronavirus 2 by RT PCR NEGATIVE NEGATIVE Final    Comment: (NOTE) SARS-CoV-2 target nucleic acids are NOT DETECTED.  The SARS-CoV-2 RNA is generally detectable in upper respiratory specimens during the acute phase of infection. The lowest concentration of SARS-CoV-2 viral copies this assay can detect is 138 copies/mL. A negative result does not preclude SARS-Cov-2 infection and should not be used as the sole basis for treatment or other patient management decisions. A negative result may occur with  improper specimen collection/handling, submission of specimen other than nasopharyngeal swab, presence of viral mutation(s) within the areas targeted by this assay, and inadequate number of viral copies(<138 copies/mL). A negative result must be combined with clinical observations, patient history, and epidemiological information. The expected result is Negative.  Fact Sheet for Patients:  EntrepreneurPulse.com.au  Fact Sheet for Healthcare Providers:  IncredibleEmployment.be  This test is no t yet approved or cleared by the [place] FDA and  has been authorized for detection and/or diagnosis of SARS-CoV-2 by FDA under an Emergency Use Authorization (EUA). This EUA will remain  in effect (meaning this test can be used) for the duration of the COVID-19 declaration under Section 564(b)(1) of the Act, 21 U.S.C.section  360bbb-3(b)(1), unless the authorization is terminated  or revoked sooner.       Influenza A by PCR NEGATIVE NEGATIVE Final   Influenza B by PCR NEGATIVE NEGATIVE Final    Comment: (NOTE) The Xpert Xpress SARS-CoV-2/FLU/RSV plus assay is intended as an aid in the diagnosis of influenza from Nasopharyngeal swab specimens and should not be used as a sole basis for treatment. Nasal washings and aspirates are unacceptable for Xpert Xpress SARS-CoV-2/FLU/RSV testing.  Fact Sheet for Patients: EntrepreneurPulse.com.au  Fact Sheet for Healthcare Providers: IncredibleEmployment.be  This test is not yet approved or cleared by the [place] FDA and has been authorized for detection and/or diagnosis of SARS-CoV-2 by FDA under an Emergency Use Authorization (EUA). This EUA will remain in effect (meaning this test can be used) for the duration of the COVID-19 declaration under Section 564(b)(1) of the Act, 21 U.S.C. section 360bbb-3(b)(1), unless the authorization is terminated or revoked.  Performed at [facility], [address], [place], [place] [address]      Scheduled Meds: . COVID-19 mRNA vaccine (Moderna)  0.25 mL Intramuscular Once  . ezetimibe  10 mg Oral QHS  . FLUoxetine  20 mg Oral Daily  . furosemide  60 mg Intravenous BID  . pantoprazole  40 mg Oral Daily  . polyethylene glycol  17 g Oral Daily  . potassium chloride SA  20 mEq Oral BID  . spironolactone  25 mg Oral Daily   Continuous Infusions: . albumin human 50 g (02/15/20 0852)    Procedures/Studies: CT Lumbar Spine Wo Contrast  Result Date: 02/13/2020 CLINICAL DATA:  Low back pain, trauma, fall into low back. Acute midline tenderness. EXAM: CT LUMBAR SPINE WITHOUT CONTRAST TECHNIQUE: Multidetector CT imaging of the lumbar spine was performed without intravenous contrast administration. Multiplanar CT image reconstructions were also generated. COMPARISON:  Reformats from  abdominal CT 01/24/2020, report from lumbar spine MRI 12/12/2017, images not available FINDINGS: Segmentation: 5 lumbar type vertebrae. Alignment: Normal. Vertebrae: Chronic L1 compression fracture, unchanged from prior exam, with vertebral plasty. No interval progression. The remaining vertebral body heights are normal. No acute fracture. Posterior elements are intact. Prior laminectomies at L4 and L5. Paraspinal and other soft tissues: Small amount of cement in the left paraspinal musculature adjacent to L1 from prior vertebroplasty. Otherwise negative. Disc levels: Interbody spacer at L4-L5 which is slightly posteriorly positioned, also described on prior exam. Interbody spacer at L5-S1 appear seated. Disc space narrowing at L1-L2. There is mild multilevel facet hypertrophy. IMPRESSION: 1. No acute fracture or subluxation of the lumbar spine. 2. Chronic L1 compression fracture with vertebral augmentation, unchanged from prior exam. 3. Postsurgical change at L4-L5 and L5-S1 with interbody spacers. L4-L5 interbody cages slightly posteriorly positions, also described on prior MRI and likely chronic. Electronically Signed   By: Keith Rake M.D.   On: 02/13/2020 17:15   CT ABDOMEN PELVIS W CONTRAST  Result Date: 01/24/2020 CLINICAL DATA:  Abdominal pain and fever. EXAM: CT ABDOMEN AND PELVIS WITH CONTRAST TECHNIQUE: Multidetector CT imaging of the abdomen and pelvis was performed using the standard protocol following bolus administration of intravenous contrast. CONTRAST:  65m OMNIPAQUE IOHEXOL 300 MG/ML  SOLN COMPARISON:  CT angiography of the abdomen pelvis 05/23/2019 FINDINGS: Lower chest: Subsegmental atelectasis versus scar noted within the right middle lobe and both posterior lower lobes.6 mm ground-glass nodule is identified within the left lower lobe. This is new when compared with 05/23/2019. Hepatobiliary: Advanced changes of cirrhosis. The liver has a shrunken and nodular appearance. No focal  liver abnormality identified. Gallbladder is unremarkable. Tiny stones within the dependent portion of the gallbladder suspected. No signs of biliary ductal dilatation or acute cholecystitis. Pancreas: Unremarkable. No pancreatic ductal dilatation or surrounding inflammatory changes. Spleen: Enlarged measuring 17.5 by 11.3 x 6.5 cm (volume = 670 cm^3) Adrenals/Urinary Tract: Normal appearance of the adrenal glands. No kidney mass or hydronephrosis identified. Urinary bladder is unremarkable. Stomach/Bowel: Stomach appears nondistended. The appendix is visualized and appears normal. No small bowel wall thickening, inflammation or distension. There is mild wall thickening involving the ascending colon, nonspecific in the setting of cirrhosis, ascites and portal venous hypertension. Sigmoid diverticulosis noted without acute inflammation. Vascular/Lymphatic: Aortic atherosclerosis. No aneurysm. Distal esophageal varices noted. There also large gastric and umbilical varices. No abdominal no pelvic adenopathy. Varices. Reproductive: Status post hysterectomy. No adnexal masses. Other: New upper abdominal ascites overlying the spleen and liver. No discrete fluid collections identified. Musculoskeletal: Previous right hip arthroplasty. Status post vertebral augmentation at L1. Interbody spacers are noted at L4-5 and L5-S1. IMPRESSION: 1. No acute findings identified within the abdomen or pelvis. 2. Advanced changes of cirrhosis and portal venous hypertension. New upper abdominal ascites. 3. New 6 mm ground-glass nodule identified  within the left lower lobe. Likely postinflammatory Initial follow-up with CT at 6-12 months is recommended to confirm persistence. If persistent, repeat CT is recommended every 2 years until 5 years of stability has been established. This recommendation follows the consensus statement: Guidelines for Management of Incidental Pulmonary Nodules Detected on CT Images: From the Fleischner Society  2017; Radiology 2017; 284:228-243. 4. Aortic atherosclerosis. Aortic Atherosclerosis (ICD10-I70.0). Electronically Signed   By: Kerby Moors M.D.   On: 01/24/2020 14:14   US Venous Img Lower Bilateral (DVT)  Result Date: 01/24/2020 CLINICAL DATA:  Left leg swelling. EXAM: BILATERAL LOWER EXTREMITY VENOUS DOPPLER ULTRASOUND TECHNIQUE: Gray-scale sonography with graded compression, as well as color Doppler and duplex ultrasound were performed to evaluate the lower extremity deep venous systems from the level of the common femoral vein and including the common femoral, femoral, profunda femoral, popliteal and calf veins including the posterior tibial, peroneal and gastrocnemius veins when visible. The superficial great saphenous vein was also interrogated. Spectral Doppler was utilized to evaluate flow at rest and with distal augmentation maneuvers in the common femoral, femoral and popliteal veins. COMPARISON:  None. FINDINGS: RIGHT LOWER EXTREMITY Common Femoral Vein: No evidence of thrombus. Normal compressibility, respiratory phasicity and response to augmentation. Saphenofemoral Junction: No evidence of thrombus. Normal compressibility and flow on color Doppler imaging. Profunda Femoral Vein: No evidence of thrombus. Normal compressibility and flow on color Doppler imaging. Femoral Vein: No evidence of thrombus. Normal compressibility, respiratory phasicity and response to augmentation. Popliteal Vein: No evidence of thrombus. Normal compressibility, respiratory phasicity and response to augmentation. Calf Veins: Visualized right deep calf veins are patent without thrombus. Other Findings:  None. LEFT LOWER EXTREMITY Common Femoral Vein: No evidence of thrombus. Normal compressibility, respiratory phasicity and response to augmentation. Saphenofemoral Junction: No evidence of thrombus. Normal compressibility and flow on color Doppler imaging. Profunda Femoral Vein: No evidence of thrombus. Normal  compressibility and flow on color Doppler imaging. Femoral Vein: No evidence of thrombus. Normal compressibility, respiratory phasicity and response to augmentation. Popliteal Vein: No evidence of thrombus. Normal compressibility, respiratory phasicity and response to augmentation. Calf Veins: Visualized left deep calf veins are patent without thrombus. Other Findings:  None. IMPRESSION: No evidence of deep venous thrombosis in either lower extremity. Electronically Signed   By: Markus Daft M.D.   On: 01/24/2020 10:54   DG CHEST PORT 1 VIEW  Result Date: 02/13/2020 CLINICAL DATA:  Bilateral leg swelling. EXAM: PORTABLE CHEST 1 VIEW COMPARISON:  02/02/2020 FINDINGS: Post median sternotomy. The lowest most sternal wire is broken, chronic. Cardiomegaly is stable. Vascular congestion. Possible mild pulmonary edema with suspected basilar septal thickening. Multifocal linear atelectasis or scarring. No confluent consolidation. No pleural fluid. No pneumothorax. No acute osseous abnormalities are seen. IMPRESSION: 1. Cardiomegaly with vascular congestion and possible mild pulmonary edema. 2. Multifocal linear atelectasis or scarring. Electronically Signed   By: Keith Rake M.D.   On: 02/13/2020 18:53   DG Chest Portable 1 View  Result Date: 02/02/2020 CLINICAL DATA:  Volume overload, lower extremity edema EXAM: PORTABLE CHEST 1 VIEW COMPARISON:  01/23/2020 FINDINGS: Single frontal view of the chest demonstrates stable enlarged cardiac silhouette. Postsurgical changes are seen from median sternotomy. There is chronic central vascular congestion without airspace disease, effusion, or pneumothorax. IMPRESSION: 1. Central vascular congestion without edema. Electronically Signed   By: Randa Ngo M.D.   On: 02/02/2020 23:09   DG Chest Portable 1 View  Result Date: 01/23/2020 CLINICAL DATA:  Chest pain EXAM: PORTABLE CHEST  1 VIEW COMPARISON:  05/23/2019, 04/17/2018, 08/14/2016 CT 05/17/2019 FINDINGS: Post  sternotomy changes. Cardiomegaly with aortic atherosclerosis. Chronic interstitial thickening. No consolidation or effusion. No pneumothorax. IMPRESSION: Cardiomegaly without overt pulmonary edema, pleural effusion or focal airspace disease. Electronically Signed   By: Donavan Foil M.D.   On: 01/23/2020 19:38   ECHOCARDIOGRAM COMPLETE  Result Date: 01/24/2020    ECHOCARDIOGRAM REPORT   Patient Name:   KYLER LERETTE Date of Exam: 01/24/2020 Medical Rec #:  [id number]      Height:       62.0 in Accession #:    [id number]     Weight:       172.6 lb Date of Birth:  [birth year]      BSA:          1.796 m Patient Age:    63 years       BP:           107/59 mmHg Patient Gender: F              HR:           94 bpm. Exam Location:  [facility] Procedure: 2D Echo, Cardiac Doppler and Color Doppler Indications:    Chest Pain 786.50 / R07.9  History:        Patient has prior history of Echocardiogram examinations. CHF,                 CAD, Prior CABG, Arrythmias:Atrial Fibrillation; Risk                 Factors:Hypertension and Dyslipidemia. GERD, Obesity.  Sonographer:    Alvino Chapel RCS Referring Phys: [phone] Omunique Pederson IMPRESSIONS  1. Left ventricular ejection fraction, by estimation, is 35 to 40%. The left ventricle has moderately decreased function. The left ventricle demonstrates global hypokinesis. Left ventricular diastolic function could not be evaluated. There is severe hypokinesis of the left ventricular, entire inferior wall and inferolateral wall.  2. Right ventricular systolic function is mildly reduced. The right ventricular size is normal. There is mildly elevated pulmonary artery systolic pressure.  3. Left atrial size was severely dilated.  4. Right atrial size was severely dilated.  5. The mitral valve is normal in structure. Mild to moderate mitral valve regurgitation.  6. Tricuspid valve regurgitation is moderate.  7. The aortic valve is tricuspid. Aortic valve regurgitation is not visualized. Mild  aortic valve sclerosis is present, with no evidence of aortic valve stenosis.  8. The inferior vena cava is normal in size with <50% respiratory variability, suggesting right atrial pressure of 8 mmHg. Comparison(s): A prior study was performed on 05/20/2019. The left ventricular function is worsened. The left ventricular wall motion abnormalities are worse. FINDINGS  Left Ventricle: Left ventricular ejection fraction, by estimation, is 35 to 40%. The left ventricle has moderately decreased function. The left ventricle demonstrates global hypokinesis. Severe hypokinesis of the left ventricular, entire inferior wall and inferolateral wall. The left ventricular internal cavity size was normal in size. There is no left ventricular hypertrophy. Abnormal (paradoxical) septal motion consistent with post-operative status. Left ventricular diastolic function could not be evaluated due to atrial fibrillation. Left ventricular diastolic function could not be evaluated. Right Ventricle: The right ventricular size is normal. No increase in right ventricular wall thickness. Right ventricular systolic function is mildly reduced. There is mildly elevated pulmonary artery systolic pressure. The tricuspid regurgitant velocity  is 2.75 m/s, and with an assumed right atrial pressure of 8 mmHg, the  estimated right ventricular systolic pressure is 01.6 mmHg. Left Atrium: Left atrial size was severely dilated. Right Atrium: Right atrial size was severely dilated. Pericardium: There is no evidence of pericardial effusion. Mitral Valve: The mitral valve is normal in structure. Mild mitral annular calcification. Mild to moderate mitral valve regurgitation. Tricuspid Valve: The tricuspid valve is normal in structure. Tricuspid valve regurgitation is moderate. Aortic Valve: The aortic valve is tricuspid. Aortic valve regurgitation is not visualized. Mild aortic valve sclerosis is present, with no evidence of aortic valve stenosis. Pulmonic  Valve: The pulmonic valve was normal in structure. Pulmonic valve regurgitation is not visualized. Aorta: The aortic root and ascending aorta are structurally normal, with no evidence of dilitation. Venous: The inferior vena cava is normal in size with less than 50% respiratory variability, suggesting right atrial pressure of 8 mmHg. IAS/Shunts: No atrial level shunt detected by color flow Doppler.  LEFT VENTRICLE PLAX 2D LVIDd:         5.00 cm  Diastology LV PW:         1.10 cm  LV e' medial:    9.57 cm/s LV IVS:        1.00 cm  LV E/e' medial:  16.1 LVOT diam:     1.80 cm  LV e' lateral:   14.30 cm/s LV SV:         56       LV E/e' lateral: 10.8 LV SV Index:   31 LVOT Area:     2.54 cm  RIGHT VENTRICLE RV S prime:     9.25 cm/s TAPSE (M-mode): 2.0 cm LEFT ATRIUM             Index       RIGHT ATRIUM           Index LA Vol (A2C):   86.2 ml 48.00 ml/m RA Area:     23.80 cm LA Vol (A4C):   81.9 ml 45.61 ml/m RA Volume:   75.40 ml  41.99 ml/m LA Biplane Vol: 85.3 ml 47.50 ml/m  AORTIC VALVE LVOT Vmax:   99.20 cm/s LVOT Vmean:  64.300 cm/s LVOT VTI:    0.221 m  AORTA Ao Root diam: 4.90 cm MITRAL VALVE                TRICUSPID VALVE MV Area (PHT): 3.97 cm     TR Peak grad:   30.2 mmHg MV Decel Time: 191 msec     TR Vmax:        275.00 cm/s MV E velocity: 154.00 cm/s                             SHUNTS                             Systemic VTI:  0.22 m                             Systemic Diam: 1.80 cm Dani Gobble Croitoru MD Electronically signed by Sanda Klein MD Signature Date/Time: 01/24/2020/4:45:09 PM    Final    US LIVER DOPPLER  Result Date: 01/27/2020 CLINICAL DATA:  Cirrhosis. EXAM: DUPLEX ULTRASOUND OF LIVER TECHNIQUE: Color and duplex Doppler ultrasound was performed to evaluate the hepatic in-flow and out-flow vessels. COMPARISON:  CT abdomen 01/24/2020 FINDINGS: Liver: Liver is slightly heterogeneous with a  nodular contour. Findings are compatible with cirrhosis. No discrete liver lesion. Small amount  of perihepatic ascites. Main Portal Vein size: 1.5 cm Portal Vein Velocities Main Prox:  32 cm/sec Main Mid: 26 cm/sec Main Dist:  25 cm/sec Right: 20 cm/sec Left: 80 cm/sec Hepatic Vein Velocities Right:  49 cm/sec Middle:  70 cm/sec Left:  69 cm/sec IVC: Present and patent with normal respiratory phasicity. Hepatic Artery Velocity:  109 cm/sec Splenic Vein Velocity:  24 cm/sec Spleen: 15.8 cm x 15.7 cm x 7.6 cm with a total volume of 991 cm^3 (411 cm^3 is upper limit normal) Portal Vein Occlusion/Thrombus: No Splenic Vein Occlusion/Thrombus: No Ascites: Present Varices: Present Normal hepatofugal flow in the hepatic veins. Normal hepatopetal flow in the portal veins. Enlarged varices at the umbilicus and this corresponds with the previous CT findings. IMPRESSION: 1. 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 2. Portal venous system is patent with normal direction of flow. Electronically Signed   By: Markus Daft M.D.   On: 01/27/2020 13:52    Orson Eva, DO  [facility]  If 7PM-7AM, please contact night-coverage [email or web address] Password [email or web address] 02/15/2020, 2:02 PM   LOS: 2 days

## 2020-02-16 LAB — URINALYSIS, COMPLETE (UACMP) WITH MICROSCOPIC
Bacteria, UA: NONE SEEN
Bilirubin Urine: NEGATIVE
Glucose, UA: NEGATIVE mg/dL
Hgb urine dipstick: NEGATIVE
Ketones, ur: NEGATIVE mg/dL
Leukocytes,Ua: NEGATIVE
Nitrite: NEGATIVE
Protein, ur: NEGATIVE mg/dL
Specific Gravity, Urine: 1.015 (ref 1.005–1.030)
pH: 5 (ref 5.0–8.0)

## 2020-02-16 LAB — BASIC METABOLIC PANEL
Anion gap: 9 (ref 5–15)
BUN: 22 mg/dL (ref 8–23)
CO2: 22 mmol/L (ref 22–32)
Calcium: 9.3 mg/dL (ref 8.9–10.3)
Chloride: 102 mmol/L (ref 98–111)
Creatinine, Ser: 1.15 mg/dL — ABNORMAL HIGH (ref 0.44–1.00)
GFR, Estimated: 51 mL/min — ABNORMAL LOW (ref >60)
Glucose, Bld: 148 mg/dL — ABNORMAL HIGH (ref 70–99)
Potassium: 4.5 mmol/L (ref 3.5–5.1)
Sodium: 133 mmol/L — ABNORMAL LOW (ref 135–145)

## 2020-02-16 MED ORDER — SODIUM CHLORIDE 0.9 % IV SOLN: INTRAVENOUS | Status: DC | PRN

## 2020-02-16 MED ORDER — PHENOL 1.4 % MT LIQD
1.0000 | OROMUCOSAL | Status: DC | PRN
  Administered 2020-02-16: 1 via OROMUCOSAL
  Filled 2020-02-16: qty 177

## 2020-02-16 MED ORDER — COVID-19 MRNA VACC (MODERNA) 50 MCG/0.25ML IM SUSP
0.2500 mL | Freq: Once | INTRAMUSCULAR | Status: AC
  Administered 2020-02-16: 0.25 mL via INTRAMUSCULAR
  Filled 2020-02-16: qty 0.25

## 2020-02-16 NOTE — Progress Notes (Addendum)
Dressing changed to left foot.  Red area on dorsal surface had scant amount of tan drainage.  Patient stated that it looked much better. Received Moderna booster, left deltoid.

## 2020-02-16 NOTE — Progress Notes (Signed)
PROGRESS NOTE  Samantha [name] GTX:[id number] DOB: 02/25/1951 DOA: 02/13/2020 PCP: [name], MD  Brief History: 69 y/o femalewith a history of systolic and diastolic CHF,NASHliver cirrhosis, CKD stage III, hypertension, hyperlipidemia, pancytopenia, paroxysmal atrial fibrillation presenting withworsening lower extremity edema for a period of 2 to 3 weeks. The patient was recently mated to the hospital from 01/23/2020 to 01/28/2020 for abdominal pain and vomiting. During that hospital admission, the patient's torsemide was decreased back to her original dose of 20 mg daily. Since that admission, the patient had complained of gradual worsening of her lower extremity edema. She was admitted again from 02/02/2020 to 02/04/2020 for worsening lower extremity edema. Her torsemide was increased to 40 mg daily at that time. The patient continued to have worsening lower extremity edema. She endorses compliance with fluid intake as well as watching her sodium intake. She endorses compliance with her medications. She went to see her GI physician, Dr. Hassan Buckler 02/11/2020. She was instructed to discontinue her torsemide and start on furosemide 40 mg p.o. twice daily. She has noted increasing urine output, but continued to have worsening lower extremity edema. She states that her breathing is a little worse than usual with some dyspnea on exertion. She denies any fevers, chills, chest pain, coughing, hemoptysis, nausea, vomiting, diarrhea, abdominal pain. Because of her worsening lower extremity edema, the patient has had difficulty ambulating. She had a mechanical fall on 02/13/2020. She she had difficulty getting up. She continued to complain of her back pain which is worse after her fall. Given the culmination of events, the patient presented for further evaluation and treatment. In the emergency department, the patient was afebrile hemodynamically stable with oxygen saturation  97-90% room air. BMP was essentially unremarkable except for potassium 2.8. AST 58, A LT 26, alkaline phosphatase 83, total bilirubin 5.2, albumin 2.7. CBC was unremarkable with her chronic thrombocytopenia. Platelets are 49,000. CT of the lumbar spine was negative for any fracture or dislocation. Chest x-ray showed increased interstitial markings which are largely unchanged. The patient was started on intravenous furosemide.  Assessment/Plan: Decompensated liver cirrhosis/Anasarca -This is the primary driving factor for the patient's worsening lower extremity edema and anasarca -Certainly, there may be a component of cor pulmonale, CHF -01/24/2020 echo did show a decrease from her previous EF -01/24/2020 echo EF 35-40%, global HK, severe HK of the inferior lateral wall, mild decreased RV function, moderate MR, mild increase PASP -05/20/2019 echo EF 50-55% -Continue IV furosemide-->increase to 60 mg bid IV -Her diuresis would be limited by her soft blood pressures as her preload will significantly decrease with diuresis -I had an extensive goals of care discussion with the patient-->changed to DNR -continue spironolactone  Fever -12/18 evening--developed fever 102.7 -blood culture -UA/urine culture  chronic systolic and diastolic CHF -Certainly, there may be some contribution from the patient's decreased EF, but primary driving factors for decompensated liver cirrhosis -01/24/2020 echo as discussed above -holding metoprolol succinate again due to soft BPs  Persistent atrial fibrillation -Currently in sinus rhythm -not a candidate for AC due to cirrhosis, esophageal varices, thrombocytopenia  CKD 3a -baseline creatinine 0.9-1.1 -serum creatinine 1.06 on day of d/c  Coronary artery disease, history of CABG-stable no chest pains.-personally reviewedEKG--no concerningSTTwave changes. -Resume Imdurif BP allows  Hypokalemia -Repleted -Check  magnesium--2.0  Hyperlipidemia -Continue Zetiaand statin when able to tolerate po  Pancytopenia -Secondary to liver cirrhosis  Anxiety/depression -Continue fluoxetinewhen able to tolerate po  Back pain -  12/16 CT L-spine--neg for fracture or subluxation -IV dilaudid PRN  Lung Nodule -incidential finding of LLL--36m -outpatient surveillance  Goals of Care -Advance care planning, including the explanation and discussion of advance directives was carried out with the patient and family. Code status including explanations of "Full Code" and "DNR" and alternatives were discussed in detail. Discussion of end-of-life issues including but not limited palliative care, hospice care and the concept of hospice, other end-of-life care options, power of attorney for health care decisions, living wills, and physician orders for life-sustaining treatment were also discussed with the patient and family. Total face to face time 16 minutes. -confirmed DNR with patient       Status is: Inpatient  Remains inpatient appropriate because:IV treatments appropriate due to intensity of illness or inability to take PO   Dispo: The patient is from:Home Anticipated d/c is tVO:ZDGUAnticipated d/c date is: 3 days Patient currently is not medically stable to d/c.        Family Communication:NoFamily at bedside  Consultants:none  Code Status:DNR  DVT Prophylaxis: SCDs   Procedures: As Listed in Progress Note Above  Antibiotics: None    Subjective: Patient denies fevers, chills, headache, chest pain, dyspnea, nausea, vomiting, diarrhea, abdominal pain, dysuria, hematuria, hematochezia, and melena. She feels that her legs are less edematous  Objective: Vitals:   02/15/20 2256 02/16/20 0411 02/16/20 0420 02/16/20 1300  BP:  (!) 88/62  (!) 105/55  Pulse:  (!) 103 98 (!) 105  Resp:  18  20  Temp: 100.2  F (37.9 C) 99 F (37.2 C)  98.6 F (37 C)  TempSrc: Oral Oral  Oral  SpO2:  93%  93%  Weight:      Height:        Intake/Output Summary (Last 24 hours) at 02/16/2020 1653 Last data filed at 02/16/2020 0806 Gross per 24 hour  Intake 720 ml  Output 200 ml  Net 520 ml   Weight change:  Exam:   General:  Pt is alert, follows commands appropriately, not in acute distress  HEENT: No icterus, No thrush, No neck mass, [place]/AT  Cardiovascular: RRR, S1/S2, no rubs, no gallops  Respiratory: bibasilar rales. No wheeze  Abdomen: Soft/+BS, non tender, non distended, no guarding  Extremities: 2+ LE edema, No lymphangitis, No petechiae, No rashes, no synovitis   Data Reviewed: I have personally reviewed following labs and imaging studies Basic Metabolic Panel: Recent Labs  Lab 02/13/20 1637 02/15/20 0443 02/16/20 0449  NA 137 134* 133*  K 2.8* 4.3 4.5  CL 104 104 102  CO2 24 24 22   GLUCOSE 63* 104* 148*  BUN 16 17 22   CREATININE 1.04* 1.06* 1.15*  CALCIUM 8.9 8.7* 9.3  MG 2.1 1.9  --    Liver Function Tests: Recent Labs  Lab 02/13/20 1637 02/15/20 0443  AST 58* 53*  ALT 26 21  ALKPHOS 83 69  BILITOT 5.2* 4.7*  PROT 6.8 6.2*  ALBUMIN 2.7* 3.0*   Recent Labs  Lab 02/13/20 1637  LIPASE 44   No results for input(s): AMMONIA in the last 168 hours. Coagulation Profile: Recent Labs  Lab 02/13/20 1637  INR 1.6*   CBC: Recent Labs  Lab 02/13/20 1637 02/15/20 0443  WBC 5.8 4.7  HGB 12.0 10.3*  HCT 36.5 31.8*  MCV 97.6 100.6*  PLT 49* 41*   Cardiac Enzymes: No results for input(s): CKTOTAL, CKMB, CKMBINDEX, TROPONINI in the last 168 hours. BNP: Invalid input(s): POCBNP CBG: Recent Labs  Lab  02/13/20 2247 02/15/20 2149  GLUCAP 130* 124*   HbA1C: No results for input(s): HGBA1C in the last 72 hours. Urine analysis:    Component Value Date/Time   COLORURINE YELLOW 05/19/2019 2015   APPEARANCEUR HAZY (A) 05/19/2019 2015   LABSPEC 1.013  05/19/2019 2015   PHURINE 5.0 05/19/2019 2015   GLUCOSEU NEGATIVE 05/19/2019 2015   HGBUR NEGATIVE 05/19/2019 2015   [place] NEGATIVE 05/19/2019 2015   KETONESUR NEGATIVE 05/19/2019 2015   PROTEINUR NEGATIVE 05/19/2019 2015   NITRITE NEGATIVE 05/19/2019 2015   LEUKOCYTESUR NEGATIVE 05/19/2019 2015   Sepsis Labs: @LABRCNTIP (procalcitonin:4,lacticidven:4) ) Recent Results (from the past 240 hour(s))  Resp Panel by RT-PCR (Flu A&B, Covid) Nasopharyngeal Swab     Status: None   Collection Time: 02/13/20  6:00 PM   Specimen: Nasopharyngeal Swab; Nasopharyngeal(NP) swabs in vial transport medium  Result Value Ref Range Status   SARS Coronavirus 2 by RT PCR NEGATIVE NEGATIVE Final    Comment: (NOTE) SARS-CoV-2 target nucleic acids are NOT DETECTED.  The SARS-CoV-2 RNA is generally detectable in upper respiratory specimens during the acute phase of infection. The lowest concentration of SARS-CoV-2 viral copies this assay can detect is 138 copies/mL. A negative result does not preclude SARS-Cov-2 infection and should not be used as the sole basis for treatment or other patient management decisions. A negative result may occur with  improper specimen collection/handling, submission of specimen other than nasopharyngeal swab, presence of viral mutation(s) within the areas targeted by this assay, and inadequate number of viral copies(<138 copies/mL). A negative result must be combined with clinical observations, patient history, and epidemiological information. The expected result is Negative.  Fact Sheet for Patients:  EntrepreneurPulse.com.au  Fact Sheet for Healthcare Providers:  IncredibleEmployment.be  This test is no t yet approved or cleared by the [place] FDA and  has been authorized for detection and/or diagnosis of SARS-CoV-2 by FDA under an Emergency Use Authorization (EUA). This EUA will remain  in effect (meaning this test can  be used) for the duration of the COVID-19 declaration under Section 564(b)(1) of the Act, 21 U.S.C.section 360bbb-3(b)(1), unless the authorization is terminated  or revoked sooner.       Influenza A by PCR NEGATIVE NEGATIVE Final   Influenza B by PCR NEGATIVE NEGATIVE Final    Comment: (NOTE) The Xpert Xpress SARS-CoV-2/FLU/RSV plus assay is intended as an aid in the diagnosis of influenza from Nasopharyngeal swab specimens and should not be used as a sole basis for treatment. Nasal washings and aspirates are unacceptable for Xpert Xpress SARS-CoV-2/FLU/RSV testing.  Fact Sheet for Patients: EntrepreneurPulse.com.au  Fact Sheet for Healthcare Providers: IncredibleEmployment.be  This test is not yet approved or cleared by the [place] FDA and has been authorized for detection and/or diagnosis of SARS-CoV-2 by FDA under an Emergency Use Authorization (EUA). This EUA will remain in effect (meaning this test can be used) for the duration of the COVID-19 declaration under Section 564(b)(1) of the Act, 21 U.S.C. section 360bbb-3(b)(1), unless the authorization is terminated or revoked.  Performed at [facility], [address], [place], [place] [address]      Scheduled Meds: . COVID-19 mRNA vaccine (Moderna)  0.25 mL Intramuscular Once  . ezetimibe  10 mg Oral QHS  . FLUoxetine  20 mg Oral Daily  . furosemide  60 mg Intravenous BID  . pantoprazole  40 mg Oral Daily  . polyethylene glycol  17 g Oral Daily  . spironolactone  25 mg Oral Daily  Continuous Infusions: . sodium chloride 10 mL/hr at 02/16/20 0806  . albumin human 50 g (02/16/20 7616)    Procedures/Studies: CT Lumbar Spine Wo Contrast  Result Date: 02/13/2020 CLINICAL DATA:  Low back pain, trauma, fall into low back. Acute midline tenderness. EXAM: CT LUMBAR SPINE WITHOUT CONTRAST TECHNIQUE: Multidetector CT imaging of the lumbar spine was performed without intravenous  contrast administration. Multiplanar CT image reconstructions were also generated. COMPARISON:  Reformats from abdominal CT 01/24/2020, report from lumbar spine MRI 12/12/2017, images not available FINDINGS: Segmentation: 5 lumbar type vertebrae. Alignment: Normal. Vertebrae: Chronic L1 compression fracture, unchanged from prior exam, with vertebral plasty. No interval progression. The remaining vertebral body heights are normal. No acute fracture. Posterior elements are intact. Prior laminectomies at L4 and L5. Paraspinal and other soft tissues: Small amount of cement in the left paraspinal musculature adjacent to L1 from prior vertebroplasty. Otherwise negative. Disc levels: Interbody spacer at L4-L5 which is slightly posteriorly positioned, also described on prior exam. Interbody spacer at L5-S1 appear seated. Disc space narrowing at L1-L2. There is mild multilevel facet hypertrophy. IMPRESSION: 1. No acute fracture or subluxation of the lumbar spine. 2. Chronic L1 compression fracture with vertebral augmentation, unchanged from prior exam. 3. Postsurgical change at L4-L5 and L5-S1 with interbody spacers. L4-L5 interbody cages slightly posteriorly positions, also described on prior MRI and likely chronic. Electronically Signed   By: Keith Rake M.D.   On: 02/13/2020 17:15   CT ABDOMEN PELVIS W CONTRAST  Result Date: 01/24/2020 CLINICAL DATA:  Abdominal pain and fever. EXAM: CT ABDOMEN AND PELVIS WITH CONTRAST TECHNIQUE: Multidetector CT imaging of the abdomen and pelvis was performed using the standard protocol following bolus administration of intravenous contrast. CONTRAST:  60m OMNIPAQUE IOHEXOL 300 MG/ML  SOLN COMPARISON:  CT angiography of the abdomen pelvis 05/23/2019 FINDINGS: Lower chest: Subsegmental atelectasis versus scar noted within the right middle lobe and both posterior lower lobes.6 mm ground-glass nodule is identified within the left lower lobe. This is new when compared with  05/23/2019. Hepatobiliary: Advanced changes of cirrhosis. The liver has a shrunken and nodular appearance. No focal liver abnormality identified. Gallbladder is unremarkable. Tiny stones within the dependent portion of the gallbladder suspected. No signs of biliary ductal dilatation or acute cholecystitis. Pancreas: Unremarkable. No pancreatic ductal dilatation or surrounding inflammatory changes. Spleen: Enlarged measuring 17.5 by 11.3 x 6.5 cm (volume = 670 cm^3) Adrenals/Urinary Tract: Normal appearance of the adrenal glands. No kidney mass or hydronephrosis identified. Urinary bladder is unremarkable. Stomach/Bowel: Stomach appears nondistended. The appendix is visualized and appears normal. No small bowel wall thickening, inflammation or distension. There is mild wall thickening involving the ascending colon, nonspecific in the setting of cirrhosis, ascites and portal venous hypertension. Sigmoid diverticulosis noted without acute inflammation. Vascular/Lymphatic: Aortic atherosclerosis. No aneurysm. Distal esophageal varices noted. There also large gastric and umbilical varices. No abdominal no pelvic adenopathy. Varices. Reproductive: Status post hysterectomy. No adnexal masses. Other: New upper abdominal ascites overlying the spleen and liver. No discrete fluid collections identified. Musculoskeletal: Previous right hip arthroplasty. Status post vertebral augmentation at L1. Interbody spacers are noted at L4-5 and L5-S1. IMPRESSION: 1. No acute findings identified within the abdomen or pelvis. 2. Advanced changes of cirrhosis and portal venous hypertension. New upper abdominal ascites. 3. New 6 mm ground-glass nodule identified within the left lower lobe. Likely postinflammatory Initial follow-up with CT at 6-12 months is recommended to confirm persistence. If persistent, repeat CT is recommended every 2 years until 5 years of  stability has been established. This recommendation follows the consensus  statement: Guidelines for Management of Incidental Pulmonary Nodules Detected on CT Images: From the Fleischner Society 2017; Radiology 2017; 284:228-243. 4. Aortic atherosclerosis. Aortic Atherosclerosis (ICD10-I70.0). Electronically Signed   By: Kerby Moors M.D.   On: 01/24/2020 14:14   US Venous Img Lower Bilateral (DVT)  Result Date: 01/24/2020 CLINICAL DATA:  Left leg swelling. EXAM: BILATERAL LOWER EXTREMITY VENOUS DOPPLER ULTRASOUND TECHNIQUE: Gray-scale sonography with graded compression, as well as color Doppler and duplex ultrasound were performed to evaluate the lower extremity deep venous systems from the level of the common femoral vein and including the common femoral, femoral, profunda femoral, popliteal and calf veins including the posterior tibial, peroneal and gastrocnemius veins when visible. The superficial great saphenous vein was also interrogated. Spectral Doppler was utilized to evaluate flow at rest and with distal augmentation maneuvers in the common femoral, femoral and popliteal veins. COMPARISON:  None. FINDINGS: RIGHT LOWER EXTREMITY Common Femoral Vein: No evidence of thrombus. Normal compressibility, respiratory phasicity and response to augmentation. Saphenofemoral Junction: No evidence of thrombus. Normal compressibility and flow on color Doppler imaging. Profunda Femoral Vein: No evidence of thrombus. Normal compressibility and flow on color Doppler imaging. Femoral Vein: No evidence of thrombus. Normal compressibility, respiratory phasicity and response to augmentation. Popliteal Vein: No evidence of thrombus. Normal compressibility, respiratory phasicity and response to augmentation. Calf Veins: Visualized right deep calf veins are patent without thrombus. Other Findings:  None. LEFT LOWER EXTREMITY Common Femoral Vein: No evidence of thrombus. Normal compressibility, respiratory phasicity and response to augmentation. Saphenofemoral Junction: No evidence of thrombus.  Normal compressibility and flow on color Doppler imaging. Profunda Femoral Vein: No evidence of thrombus. Normal compressibility and flow on color Doppler imaging. Femoral Vein: No evidence of thrombus. Normal compressibility, respiratory phasicity and response to augmentation. Popliteal Vein: No evidence of thrombus. Normal compressibility, respiratory phasicity and response to augmentation. Calf Veins: Visualized left deep calf veins are patent without thrombus. Other Findings:  None. IMPRESSION: No evidence of deep venous thrombosis in either lower extremity. Electronically Signed   By: Markus Daft M.D.   On: 01/24/2020 10:54   DG CHEST PORT 1 VIEW  Result Date: 02/13/2020 CLINICAL DATA:  Bilateral leg swelling. EXAM: PORTABLE CHEST 1 VIEW COMPARISON:  02/02/2020 FINDINGS: Post median sternotomy. The lowest most sternal wire is broken, chronic. Cardiomegaly is stable. Vascular congestion. Possible mild pulmonary edema with suspected basilar septal thickening. Multifocal linear atelectasis or scarring. No confluent consolidation. No pleural fluid. No pneumothorax. No acute osseous abnormalities are seen. IMPRESSION: 1. Cardiomegaly with vascular congestion and possible mild pulmonary edema. 2. Multifocal linear atelectasis or scarring. Electronically Signed   By: Keith Rake M.D.   On: 02/13/2020 18:53   DG Chest Portable 1 View  Result Date: 02/02/2020 CLINICAL DATA:  Volume overload, lower extremity edema EXAM: PORTABLE CHEST 1 VIEW COMPARISON:  01/23/2020 FINDINGS: Single frontal view of the chest demonstrates stable enlarged cardiac silhouette. Postsurgical changes are seen from median sternotomy. There is chronic central vascular congestion without airspace disease, effusion, or pneumothorax. IMPRESSION: 1. Central vascular congestion without edema. Electronically Signed   By: Randa Ngo M.D.   On: 02/02/2020 23:09   DG Chest Portable 1 View  Result Date: 01/23/2020 CLINICAL DATA:  Chest  pain EXAM: PORTABLE CHEST 1 VIEW COMPARISON:  05/23/2019, 04/17/2018, 08/14/2016 CT 05/17/2019 FINDINGS: Post sternotomy changes. Cardiomegaly with aortic atherosclerosis. Chronic interstitial thickening. No consolidation or effusion. No pneumothorax. IMPRESSION: Cardiomegaly without overt pulmonary  edema, pleural effusion or focal airspace disease. Electronically Signed   By: Donavan Foil M.D.   On: 01/23/2020 19:38   ECHOCARDIOGRAM COMPLETE  Result Date: 01/24/2020    ECHOCARDIOGRAM REPORT   Patient Name:   ROMAINE MACIOLEK Date of Exam: 01/24/2020 Medical Rec #:  [id number]      Height:       62.0 in Accession #:    [id number]     Weight:       172.6 lb Date of Birth:  18-Dec-1950      BSA:          1.796 m Patient Age:    61 years       BP:           107/59 mmHg Patient Gender: F              HR:           94 bpm. Exam Location:  [facility] Procedure: 2D Echo, Cardiac Doppler and Color Doppler Indications:    Chest Pain 786.50 / R07.9  History:        Patient has prior history of Echocardiogram examinations. CHF,                 CAD, Prior CABG, Arrythmias:Atrial Fibrillation; Risk                 Factors:Hypertension and Dyslipidemia. GERD, Obesity.  Sonographer:    Alvino Chapel RCS Referring Phys: [phone] Talin Rozeboom IMPRESSIONS  1. Left ventricular ejection fraction, by estimation, is 35 to 40%. The left ventricle has moderately decreased function. The left ventricle demonstrates global hypokinesis. Left ventricular diastolic function could not be evaluated. There is severe hypokinesis of the left ventricular, entire inferior wall and inferolateral wall.  2. Right ventricular systolic function is mildly reduced. The right ventricular size is normal. There is mildly elevated pulmonary artery systolic pressure.  3. Left atrial size was severely dilated.  4. Right atrial size was severely dilated.  5. The mitral valve is normal in structure. Mild to moderate mitral valve regurgitation.  6. Tricuspid valve  regurgitation is moderate.  7. The aortic valve is tricuspid. Aortic valve regurgitation is not visualized. Mild aortic valve sclerosis is present, with no evidence of aortic valve stenosis.  8. The inferior vena cava is normal in size with <50% respiratory variability, suggesting right atrial pressure of 8 mmHg. Comparison(s): A prior study was performed on 05/20/2019. The left ventricular function is worsened. The left ventricular wall motion abnormalities are worse. FINDINGS  Left Ventricle: Left ventricular ejection fraction, by estimation, is 35 to 40%. The left ventricle has moderately decreased function. The left ventricle demonstrates global hypokinesis. Severe hypokinesis of the left ventricular, entire inferior wall and inferolateral wall. The left ventricular internal cavity size was normal in size. There is no left ventricular hypertrophy. Abnormal (paradoxical) septal motion consistent with post-operative status. Left ventricular diastolic function could not be evaluated due to atrial fibrillation. Left ventricular diastolic function could not be evaluated. Right Ventricle: The right ventricular size is normal. No increase in right ventricular wall thickness. Right ventricular systolic function is mildly reduced. There is mildly elevated pulmonary artery systolic pressure. The tricuspid regurgitant velocity  is 2.75 m/s, and with an assumed right atrial pressure of 8 mmHg, the estimated right ventricular systolic pressure is 40.0 mmHg. Left Atrium: Left atrial size was severely dilated. Right Atrium: Right atrial size was severely dilated. Pericardium: There is no evidence of pericardial effusion.  Mitral Valve: The mitral valve is normal in structure. Mild mitral annular calcification. Mild to moderate mitral valve regurgitation. Tricuspid Valve: The tricuspid valve is normal in structure. Tricuspid valve regurgitation is moderate. Aortic Valve: The aortic valve is tricuspid. Aortic valve regurgitation  is not visualized. Mild aortic valve sclerosis is present, with no evidence of aortic valve stenosis. Pulmonic Valve: The pulmonic valve was normal in structure. Pulmonic valve regurgitation is not visualized. Aorta: The aortic root and ascending aorta are structurally normal, with no evidence of dilitation. Venous: The inferior vena cava is normal in size with less than 50% respiratory variability, suggesting right atrial pressure of 8 mmHg. IAS/Shunts: No atrial level shunt detected by color flow Doppler.  LEFT VENTRICLE PLAX 2D LVIDd:         5.00 cm  Diastology LV PW:         1.10 cm  LV e' medial:    9.57 cm/s LV IVS:        1.00 cm  LV E/e' medial:  16.1 LVOT diam:     1.80 cm  LV e' lateral:   14.30 cm/s LV SV:         56       LV E/e' lateral: 10.8 LV SV Index:   31 LVOT Area:     2.54 cm  RIGHT VENTRICLE RV S prime:     9.25 cm/s TAPSE (M-mode): 2.0 cm LEFT ATRIUM             Index       RIGHT ATRIUM           Index LA Vol (A2C):   86.2 ml 48.00 ml/m RA Area:     23.80 cm LA Vol (A4C):   81.9 ml 45.61 ml/m RA Volume:   75.40 ml  41.99 ml/m LA Biplane Vol: 85.3 ml 47.50 ml/m  AORTIC VALVE LVOT Vmax:   99.20 cm/s LVOT Vmean:  64.300 cm/s LVOT VTI:    0.221 m  AORTA Ao Root diam: 4.90 cm MITRAL VALVE                TRICUSPID VALVE MV Area (PHT): 3.97 cm     TR Peak grad:   30.2 mmHg MV Decel Time: 191 msec     TR Vmax:        275.00 cm/s MV E velocity: 154.00 cm/s                             SHUNTS                             Systemic VTI:  0.22 m                             Systemic Diam: 1.80 cm Dani Gobble Croitoru MD Electronically signed by Sanda Klein MD Signature Date/Time: 01/24/2020/4:45:09 PM    Final    US LIVER DOPPLER  Result Date: 01/27/2020 CLINICAL DATA:  Cirrhosis. EXAM: DUPLEX ULTRASOUND OF LIVER TECHNIQUE: Color and duplex Doppler ultrasound was performed to evaluate the hepatic in-flow and out-flow vessels. COMPARISON:  CT abdomen 01/24/2020 FINDINGS: Liver: Liver is slightly  heterogeneous with a nodular contour. Findings are compatible with cirrhosis. No discrete liver lesion. Small amount of perihepatic ascites. Main Portal Vein size: 1.5 cm Portal Vein Velocities Main Prox:  32 cm/sec Main  Mid: 26 cm/sec Main Dist:  25 cm/sec Right: 20 cm/sec Left: 80 cm/sec Hepatic Vein Velocities Right:  49 cm/sec Middle:  70 cm/sec Left:  69 cm/sec IVC: Present and patent with normal respiratory phasicity. Hepatic Artery Velocity:  109 cm/sec Splenic Vein Velocity:  24 cm/sec Spleen: 15.8 cm x 15.7 cm x 7.6 cm with a total volume of 991 cm^3 (411 cm^3 is upper limit normal) Portal Vein Occlusion/Thrombus: No Splenic Vein Occlusion/Thrombus: No Ascites: Present Varices: Present Normal hepatofugal flow in the hepatic veins. Normal hepatopetal flow in the portal veins. Enlarged varices at the umbilicus and this corresponds with the previous CT findings. IMPRESSION: 1. 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 2. Portal venous system is patent with normal direction of flow. Electronically Signed   By: Markus Daft M.D.   On: 01/27/2020 13:52    Orson Eva, DO  [facility]  If 7PM-7AM, please contact night-coverage [email or web address] Password [email or web address] 02/16/2020, 4:53 PM   LOS: 3 days

## 2020-02-17 ENCOUNTER — Inpatient Hospital Stay (HOSPITAL_COMMUNITY): Payer: Medicare Other

## 2020-02-17 DIAGNOSIS — E8770 Fluid overload, unspecified: Secondary | ICD-10-CM

## 2020-02-17 DIAGNOSIS — Z66 Do not resuscitate: Secondary | ICD-10-CM

## 2020-02-17 DIAGNOSIS — I455 Other specified heart block: Secondary | ICD-10-CM

## 2020-02-17 DIAGNOSIS — K7460 Unspecified cirrhosis of liver: Secondary | ICD-10-CM

## 2020-02-17 LAB — MAGNESIUM: Magnesium: 2.1 mg/dL (ref 1.7–2.4)

## 2020-02-17 LAB — CBC
HCT: 30.9 % — ABNORMAL LOW (ref 36.0–46.0)
Hemoglobin: 9.9 g/dL — ABNORMAL LOW (ref 12.0–15.0)
MCH: 32.4 pg (ref 26.0–34.0)
MCHC: 32 g/dL (ref 30.0–36.0)
MCV: 101 fL — ABNORMAL HIGH (ref 80.0–100.0)
Platelets: 45 10*3/uL — ABNORMAL LOW (ref 150–400)
RBC: 3.06 MIL/uL — ABNORMAL LOW (ref 3.87–5.11)
RDW: 18.5 % — ABNORMAL HIGH (ref 11.5–15.5)
WBC: 5.3 10*3/uL (ref 4.0–10.5)
nRBC: 0 % (ref 0.0–0.2)

## 2020-02-17 LAB — HEPATIC FUNCTION PANEL
ALT: 18 U/L (ref 0–44)
AST: 42 U/L — ABNORMAL HIGH (ref 15–41)
Albumin: 4.1 g/dL (ref 3.5–5.0)
Alkaline Phosphatase: 43 U/L (ref 38–126)
Bilirubin, Direct: 1.3 mg/dL — ABNORMAL HIGH (ref 0.0–0.2)
Indirect Bilirubin: 5 mg/dL — ABNORMAL HIGH (ref 0.3–0.9)
Total Bilirubin: 6.3 mg/dL — ABNORMAL HIGH (ref 0.3–1.2)
Total Protein: 6.9 g/dL (ref 6.5–8.1)

## 2020-02-17 LAB — BASIC METABOLIC PANEL
Anion gap: 11 (ref 5–15)
BUN: 22 mg/dL (ref 8–23)
CO2: 22 mmol/L (ref 22–32)
Calcium: 9.6 mg/dL (ref 8.9–10.3)
Chloride: 101 mmol/L (ref 98–111)
Creatinine, Ser: 1.15 mg/dL — ABNORMAL HIGH (ref 0.44–1.00)
GFR, Estimated: 51 mL/min — ABNORMAL LOW (ref >60)
Glucose, Bld: 100 mg/dL — ABNORMAL HIGH (ref 70–99)
Potassium: 4.6 mmol/L (ref 3.5–5.1)
Sodium: 134 mmol/L — ABNORMAL LOW (ref 135–145)

## 2020-02-17 LAB — AMMONIA: Ammonia: 35 umol/L (ref 9–35)

## 2020-02-17 MED ORDER — OXYCODONE HCL 5 MG PO TABS
10.0000 mg | ORAL_TABLET | ORAL | Status: DC | PRN
Start: 2020-02-17 — End: 2020-02-19
  Administered 2020-02-17 – 2020-02-19 (×6): 10 mg via ORAL
  Filled 2020-02-17 (×7): qty 2

## 2020-02-17 NOTE — Plan of Care (Signed)
  Problem: Acute Rehab PT Goals(only PT should resolve) Goal: Pt Will Go Supine/Side To Sit Outcome: Progressing Flowsheets (Taken 02/17/2020 1436) Pt will go Supine/Side to Sit: with modified independence Goal: Patient Will Transfer Sit To/From Stand Outcome: Progressing Flowsheets (Taken 02/17/2020 1436) Patient will transfer sit to/from stand: with modified independence Goal: Pt Will Transfer Bed To Chair/Chair To Bed Outcome: Progressing Flowsheets (Taken 02/17/2020 1436) Pt will Transfer Bed to Chair/Chair to Bed: with modified independence Goal: Pt Will Ambulate Outcome: Progressing Flowsheets (Taken 02/17/2020 1436) Pt will Ambulate:  75 feet  with supervision  with rolling walker  2:37 PM,02/17/20 Domenic Moras, PT, DPT Physical Therapist at [facility]

## 2020-02-17 NOTE — Evaluation (Signed)
Physical Therapy Evaluation Patient Details Name: Samantha [name] MRN: [id number] DOB: 1950-11-16 Today's Date: 02/17/2020   History of Present Illness  Samantha [name] is a 69 y.o. female with medical history significant for  CABG x3, non alcoholic liver disease, atria fibrillation, HTN.  Patient presented to the ED with complaints of bilateral lower extremity swelling.  This has been an ongoing issue for several weeks to months, with hospitalizations 11/25-11/30 and 12/6 - 12/7.  Patient reports compliance with torsemide, she was switched to Lasix yesterday.  She reports improvement in urine output with Lasix.  But currently patient is unable to ambulate due to significant bilateral lower extremity swelling.  She is having weeping and blisters on her right lower extremities.  She denies abdominal bloating.  She reports she fell onto her back and is reporting lower back pain, did not hit head.  Clinical Impression  Patient present in room and states very tired but willing to participate in PT. Able to complete bed mobility well but very slow and labored movements throughout with HOB elevated. Patient transferred sit to stand and ambulated to bathroom with RW and completed those transfers with MinA and able to clean independently and wash hands with supervision. Ambulate with RW x30 ft and require multiple rest breaks.  Patient was on room air throughout session, but after ambulation on EOB, O2 Sat decreased to 86% per portable OulseOx, raised to 92% with pursed lipped breathing. Transferred back to bed and nurse called to discuss patient's drop in O2Sat and patients request for pain meds. Patient will benefit from continued physical therapy in hospital and recommended venue below to increase strength, balance, endurance for safe ADLs and gait.     Follow Up Recommendations Home health PT;Supervision for mobility/OOB;Supervision - Intermittent    Equipment Recommendations  None recommended by PT     Recommendations for Other Services       Precautions / Restrictions Precautions Precautions: Fall Restrictions Weight Bearing Restrictions: No      Mobility  Bed Mobility Overal bed mobility: Modified Independent;Needs Assistance Bed Mobility: Supine to Sit;Sit to Supine     Supine to sit: Supervision;Modified independent (Device/Increase time) Sit to supine: Supervision;Modified independent (Device/Increase time)   General bed mobility comments: very very slow but able to complete independenly with HOB elevated    Transfers Overall transfer level: Needs assistance Equipment used: Rolling walker (2 wheeled) Transfers: Sit to/from Stand Sit to Stand: Supervision;Min guard         General transfer comment: slow and labored, with RW  Ambulation/Gait Ambulation/Gait assistance: Min guard Gait Distance (Feet): 30 Feet Assistive device: Rolling walker (2 wheeled) Gait Pattern/deviations: Step-through pattern;Decreased step length - left;Decreased step length - right;Shuffle Gait velocity: decreased   General Gait Details: very slow and labored, multiple rest breaks, with RW  Stairs            Wheelchair Mobility    Modified Rankin (Stroke Patients Only)       Balance Overall balance assessment: Needs assistance Sitting-balance support: Feet supported;Single extremity supported Sitting balance-Leahy Scale: Fair Sitting balance - Comments: EOB   Standing balance support: Bilateral upper extremity supported;During functional activity Standing balance-Leahy Scale: Fair Standing balance comment: with RW                             Pertinent Vitals/Pain Pain Assessment: Faces Faces Pain Scale: Hurts little more Pain Location: back Pain Descriptors / Indicators: Sore Pain  Intervention(s): Limited activity within patient's tolerance;Monitored during session;Patient requesting pain meds-RN notified    Home Living Family/patient expects to be  discharged to:: Private residence Living Arrangements: Other relatives Available Help at Discharge: Family;Friend(s) Type of Home: House Home Access: Level entry     Home Layout: Able to live on main level with bedroom/bathroom;Multi-level Home Equipment: Walker - 2 wheels;Walker - 4 wheels;Cane - single point;Bedside commode;Shower seat      Prior Function Level of Independence: Independent with assistive device(s)         Comments: some community ambulation with AD     Hand Dominance        Extremity/Trunk Assessment   Upper Extremity Assessment Upper Extremity Assessment: Overall WFL for tasks assessed    Lower Extremity Assessment Lower Extremity Assessment: Generalized weakness    Cervical / Trunk Assessment Cervical / Trunk Assessment: Normal  Communication   Communication: No difficulties  Cognition Arousal/Alertness: Awake/alert Behavior During Therapy: WFL for tasks assessed/performed Overall Cognitive Status: Within Functional Limits for tasks assessed                                        General Comments      Exercises     Assessment/Plan    PT Assessment Patient needs continued PT services  PT Problem List Decreased strength;Decreased activity tolerance;Decreased balance       PT Treatment Interventions DME instruction;Therapeutic exercise;Gait training;Balance training;Stair training;Neuromuscular re-education;Functional mobility training;Therapeutic activities;Patient/family education    PT Goals (Current goals can be found in the Care Plan section)  Acute Rehab PT Goals Patient Stated Goal: return home PT Goal Formulation: With patient Time For Goal Achievement: 03/02/20 Potential to Achieve Goals: Good    Frequency Min 3X/week   Barriers to discharge        Co-evaluation               AM-PAC PT "6 Clicks" Mobility  Outcome Measure Help needed turning from your back to your side while in a flat bed  without using bedrails?: A Lot Help needed moving from lying on your back to sitting on the side of a flat bed without using bedrails?: A Lot Help needed moving to and from a bed to a chair (including a wheelchair)?: A Little Help needed standing up from a chair using your arms (e.g., wheelchair or bedside chair)?: A Little Help needed to walk in hospital room?: A Little Help needed climbing 3-5 steps with a railing? : A Lot 6 Click Score: 15    End of Session   Activity Tolerance: Patient tolerated treatment well;Patient limited by fatigue Patient left: in bed;with call bell/phone within reach;with bed alarm set Nurse Communication: Mobility status;Other (comment) (request for pain meds and drop in O2 mentioned above) PT Visit Diagnosis: Unsteadiness on feet (R26.81);Other abnormalities of gait and mobility (R26.89);Muscle weakness (generalized) (M62.81)    Time: 2774-1287 PT Time Calculation (min) (ACUTE ONLY): 30 min   Charges:   PT Evaluation $PT Eval Moderate Complexity: 1 Mod PT Treatments $Gait Training: 8-22 mins $Therapeutic Activity: 8-22 mins        2:34 PM,02/17/20 Domenic Moras, PT, DPT Physical Therapist at [facility]

## 2020-02-17 NOTE — Consult Note (Signed)
CARDIOLOGY CONSULT NOTE       Patient ID: Samantha [name] MRN: [id number] DOB/AGE: [birth year] 69 y.o.  Admit date: 02/13/2020 Referring Physician: Tat Primary Physician: [name], MD Primary Cardiologist: Burt Knack Reason for Consultation: Bradycardia  Principal Problem:   Volume overload Active Problems:   CAD (coronary artery disease)   Essential hypertension   S/P CABG x 3- 2001   Cirrhosis, non-alcoholic (HCC)   Cardiomyopathy, ischemic-35-40% by cath    Persistent atrial fibrillation   Chronic combined systolic and diastolic CHF (congestive heart failure) (HCC)   Paroxysmal atrial fibrillation ([place])   Anasarca   Cirrhosis of liver with ascites ([place])   Hypokalemia   Hepatic cirrhosis (HCC)   HPI:  69 y.o. DNR with severe cirrhosis, portal hypertension, varices and bilirubin 6.3. Admitted with volume overload and LE edema. CXR with mild CHF. History of CABG with ischemic DCM Echo 01/24/20 EF 35-40% with severe bi atrial enlargement midl to moderate MR and moderate TR. Failed home diuretics with demedex and aldactone. Has diuresed well since admission 12/16 with better breathing and LE edema. History of PAF, chronic LBBB and first degree block Beta blocker resumed by Dr Domenic Polite on consult done 01/27/20 mostly for her CAD and ischemic DCM.  Review of ECG;s and telemetry show PAF with LBBB ? Atypical flutter She had a 6 second conversion pause but is back in atypical ? Flutter now. She is not on antiplatelet's for her CAD and not on anticoagulation for her PAF due to cirrhosis and varices Current PLT count is only 45.  She carries diagnosis of allergies to entresto and ACE/ARB ? Swelling Currently feels well Felt "funny" during pause but no pre syncope Baseline function extremely poor not really leaving house barely dose ADL and lives with family   ROS All other systems reviewed and negative except as noted above  Past Medical History:  Diagnosis Date  . Cataract    OU  .  Chronic combined systolic and diastolic CHF (congestive heart failure) ([place])   . Cirrhosis of liver ([place])   . CKD (chronic kidney disease), stage III ([place])   . Coronary artery disease    a. s/p CABG 2001 w/ (LIMA-OM, SVG-D1, SVG-RCA). b. h/o multiple PCIs per Dr. Antionette Char note.  . Depression   . Esophageal varices ([place])    New 2013  . Gastroesophageal reflux disease   . History of pneumonia   . Hyperlipidemia   . Hypertension   . Hypertensive retinopathy    OU  . Obesity   . Osteoarthritis   . Pancytopenia ([place])   . Paroxysmal atrial flutter ([place])    a. dx 05/2016.  [name] Persistent atrial fibrillation ([place])    a. reported in hosp 07/2016, not on anticoag due to cirrhosis and liver disease, low platelets, varices.  . Thrombocytopenia ([place])     Family History  Problem Relation Age of Onset  . Heart attack Mother   . Diabetes Mother   . Heart attack Father 51       cause of death  . Diabetes Father   . Coronary artery disease Sister        CABG   . Diabetes Sister   . Glaucoma Sister   . Diabetes Brother   . Colon cancer Neg Hx     Social History   Socioeconomic History  . Marital status: Divorced    Spouse name: Not on file  . Number of children: Not on file  . Years of education:  Not on file  . Highest education level: Not on file  Occupational History  . Occupation: Disabled  Tobacco Use  . Smoking status: Former Smoker    Packs/day: 0.50    Years: 20.00    Pack years: 10.00    Types: Cigarettes    Quit date: 07/21/1995    Years since quitting: 24.5  . Smokeless tobacco: Never Used  Vaping Use  . Vaping Use: Never used  Substance and Sexual Activity  . Alcohol use: No  . Drug use: No  . Sexual activity: Not on file  Other Topics Concern  . Not on file  Social History Narrative   Lives in [place] by herself.     Social Determinants of Health   Financial Resource Strain: Not on file  Food Insecurity: Not on file  Transportation Needs: Not on file  Physical  Activity: Not on file  Stress: Not on file  Social Connections: Not on file  Intimate Partner Violence: Not on file    Past Surgical History:  Procedure Laterality Date  . ABDOMINAL HYSTERECTOMY    . BACK SURGERY    . CARDIAC CATHETERIZATION  2004   left internal mammary artery to the obtuse marginal  was found to be small and thread like.  The two grafts were patent.  The left circumflex had 90% in-stent restenosis and cutting balloon angioplasty was performed followed by placement of a 3.0 x 48m Taxus drug -eluting stent.    .Marland KitchenCARDIAC CATHETERIZATION  2006   There was in-stent restenosis in the left circumflex and this was treated with cutting balloon angioplasty   . CARDIAC CATHETERIZATION  2008   vein graft to the to the obtuse marginal was patent, although small, left circumflex had 40% in-stent restenosis, ejection fraction 40-45%.  The patient was medically mananged.  .Marland KitchenCARDIAC CATHETERIZATION N/A 01/09/2015   Procedure: Left Heart Cath and Cors/Grafts Angiography;  Surgeon: HBelva Crome MD;  [facility];  Service: Cardiovascular;  Laterality: N/A;  . CATARACT EXTRACTION W/PHACO Right 05/17/2019   Procedure: CATARACT EXTRACTION PHACO AND INTRAOCULAR LENS PLACEMENT (IOC) (CDE: 14.99);  Surgeon: WBaruch Goldmann MD;  Location: [facility];  Service: Ophthalmology;  Laterality: Right;  . CATARACT EXTRACTION W/PHACO Left 06/10/2019   Procedure: CATARACT EXTRACTION PHACO AND INTRAOCULAR LENS PLACEMENT (IOC);  Surgeon: WBaruch Goldmann MD;  Location: [facility];  Service: Ophthalmology;  Laterality: Left;  CDE: 11.96  . cataract sx    . COLONOSCOPY  03/29/2011   Procedure: COLONOSCOPY;  Surgeon: MJamesetta So MD;  [facility];  Service: Gastroenterology;  Laterality: N/A;  . CORONARY ARTERY BYPASS GRAFT  May 31,2001   x 3 with a vein graft to the first diagonal, vein graft to the right coronary  artery, and a free left internal mammary  artery to the obtuse marginal   .  ESOPHAGEAL BANDING N/A 07/04/2012   Procedure: ESOPHAGEAL BANDING;  Surgeon: NRogene Houston MD;  [facility];  Service: Endoscopy;  Laterality: N/A;  . ESOPHAGEAL BANDING N/A 09/17/2012   Procedure: ESOPHAGEAL BANDING;  Surgeon: NRogene Houston MD;  [facility];  Service: Endoscopy;  Laterality: N/A;  . ESOPHAGEAL BANDING N/A 10/22/2013   Procedure: ESOPHAGEAL BANDING;  Surgeon: NRogene Houston MD;  [facility];  Service: Endoscopy;  Laterality: N/A;  . ESOPHAGEAL BANDING N/A 11/28/2014   Procedure: ESOPHAGEAL BANDING;  Surgeon: NRogene Houston MD;  [facility];  Service: Endoscopy;  Laterality: N/A;  . ESOPHAGEAL BANDING N/A 10/29/2015   Procedure: ESOPHAGEAL BANDING;  Surgeon: Rogene Houston, MD;  [facility];  Service: Endoscopy;  Laterality: N/A;  . ESOPHAGOGASTRODUODENOSCOPY  12/20/2011   Procedure: ESOPHAGOGASTRODUODENOSCOPY (EGD);  Surgeon: Jamesetta So, MD;  [facility];  Service: Gastroenterology;  Laterality: N/A;  . ESOPHAGOGASTRODUODENOSCOPY N/A 07/04/2012   Procedure: ESOPHAGOGASTRODUODENOSCOPY (EGD);  Surgeon: Rogene Houston, MD;  [facility];  Service: Endoscopy;  Laterality: N/A;  235-moved to 255 Ann to notify pt  . ESOPHAGOGASTRODUODENOSCOPY N/A 09/17/2012   Procedure: ESOPHAGOGASTRODUODENOSCOPY (EGD);  Surgeon: Rogene Houston, MD;  [facility];  Service: Endoscopy;  Laterality: N/A;  730  . ESOPHAGOGASTRODUODENOSCOPY N/A 10/22/2013   Procedure: ESOPHAGOGASTRODUODENOSCOPY (EGD);  Surgeon: Rogene Houston, MD;  [facility];  Service: Endoscopy;  Laterality: N/A;  730  . ESOPHAGOGASTRODUODENOSCOPY N/A 11/28/2014   Procedure: ESOPHAGOGASTRODUODENOSCOPY (EGD);  Surgeon: Rogene Houston, MD;  [facility];  Service: Endoscopy;  Laterality: N/A;  1:25  . ESOPHAGOGASTRODUODENOSCOPY N/A 10/29/2015   Procedure: ESOPHAGOGASTRODUODENOSCOPY (EGD);  Surgeon: Rogene Houston,  MD;  [facility];  Service: Endoscopy;  Laterality: N/A;  12:00  . ESOPHAGOGASTRODUODENOSCOPY N/A 12/12/2016   Grade 1 and 2 varices in lower third of esophagus, post variceal banding scar distal esophagus, mild portal hypertensive gastropathy, Type 1 gastroesophageal varices without bleeding, normal duodenum and second portion of duodenum.   [name] FLEXIBLE SIGMOIDOSCOPY N/A 03/20/2017   external hemorrhoids  . JOINT REPLACEMENT    . LEFT HEART CATH AND CORS/GRAFTS ANGIOGRAPHY N/A 08/15/2016   Procedure: Left Heart Cath and Cors/Grafts Angiography;  Surgeon: Jettie Booze, MD;  Location: [facility];  Service: Cardiovascular;  Laterality: N/A;  . LEFT HEART CATHETERIZATION WITH CORONARY/GRAFT ANGIOGRAM N/A 12/25/2013   Procedure: LEFT HEART CATHETERIZATION WITH Beatrix Fetters;  Surgeon: Blane Ohara, MD;  Location: [facility];  Service: Cardiovascular;  Laterality: N/A;  . RIGHT HEART CATH N/A 05/29/2017   Procedure: RIGHT HEART CATH;  Surgeon: Jolaine Artist, MD;  Location: [facility];  Service: Cardiovascular;  Laterality: N/A;  . rotator cuff left  2007  . TONSILLECTOMY        Current Facility-Administered Medications:  .  0.9 %  sodium chloride infusion, , Intravenous, PRN, Tat, David, MD, Last Rate: 10 mL/hr at 02/16/20 0806, New Bag at 02/16/20 0806 .  acetaminophen (TYLENOL) tablet 650 mg, 650 mg, Oral, Q6H PRN, 650 mg at 02/15/20 2156 **OR** acetaminophen (TYLENOL) suppository 650 mg, 650 mg, Rectal, Q6H PRN, Emokpae, Ejiroghene E, MD .  albumin human 25 % solution 50 g, 50 g, Intravenous, Daily, Emokpae, Ejiroghene E, MD, Last Rate: 60 mL/hr at 02/17/20 1107, 50 g at 02/17/20 1107 .  cyclobenzaprine (FLEXERIL) tablet 5 mg, 5 mg, Oral, TID PRN, Emokpae, Ejiroghene E, MD, 5 mg at 02/17/20 1110 .  ezetimibe (ZETIA) tablet 10 mg, 10 mg, Oral, QHS, Emokpae, Ejiroghene E, MD, 10 mg at 02/16/20 2036 .  FLUoxetine (PROZAC) capsule 20 mg, 20 mg,  Oral, Daily, Emokpae, Ejiroghene E, MD, 20 mg at 02/17/20 1112 .  furosemide (LASIX) injection 60 mg, 60 mg, Intravenous, BID, Tat, David, MD, 60 mg at 02/17/20 6433 .  pantoprazole (PROTONIX) EC tablet 40 mg, 40 mg, Oral, Daily, Emokpae, Ejiroghene E, MD, 40 mg at 02/17/20 1110 .  phenol (CHLORASEPTIC) mouth spray 1 spray, 1 spray, Mouth/Throat, PRN, Tat, David, MD, 1 spray at 02/16/20 0131 .  polyethylene glycol (MIRALAX / GLYCOLAX) packet 17 g, 17 g, Oral, Daily, Emokpae, Ejiroghene E, MD, 17 g at 02/17/20 1110 .  spironolactone (ALDACTONE) tablet 25 mg, 25 mg, Oral, Daily, Emokpae, Ejiroghene E, MD, 25 mg at 02/17/20 1110 . ezetimibe  10 mg Oral QHS  . FLUoxetine  20 mg Oral Daily  . furosemide  60 mg Intravenous BID  . pantoprazole  40 mg Oral Daily  . polyethylene glycol  17 g Oral Daily  . spironolactone  25 mg Oral Daily   . sodium chloride 10 mL/hr at 02/16/20 0806  . albumin human 50 g (02/17/20 1107)    Physical Exam: Blood pressure 101/64, pulse (!) 108, temperature 98.9 F (37.2 C), temperature source Oral, resp. rate 16, height 5' 2"  (1.575 m), weight 87.8 kg, last menstrual period 03/29/2011, SpO2 91 %.    Affect appropriate Chronically ill Jaundice female  HEENT: scleral icterus  Neck supple with no adenopathy JVP elevated  Lungs clear with no wheezing and good diaphragmatic motion Heart:  S1/S2 MR murmur, no rub, gallop or click PMI normal Abdomen: non tender ascites  Distal pulses intact with no bruits Plus 2 LE  edema Neuro non-focal Skin warm and dry No muscular weakness   Labs:   Lab Results  Component Value Date   WBC 5.3 02/17/2020   HGB 9.9 (L) 02/17/2020   HCT 30.9 (L) 02/17/2020   MCV 101.0 (H) 02/17/2020   PLT 45 (L) 02/17/2020    Recent Labs  Lab 02/17/20 0344 02/17/20 1219  NA 134*  --   K 4.6  --   CL 101  --   CO2 22  --   BUN 22  --   CREATININE 1.15*  --   CALCIUM 9.6  --   PROT  --  6.9  BILITOT  --  6.3*  ALKPHOS  --  43   ALT  --  18  AST  --  42*  GLUCOSE 100*  --    Lab Results  Component Value Date   CKTOTAL 34 04/26/2008   CKMB 0.7 04/26/2008   TROPONINI <0.03 04/12/2018    Lab Results  Component Value Date   CHOL 111 05/26/2017   CHOL 143 08/15/2016   CHOL 135 01/09/2015   Lab Results  Component Value Date   HDL 38 (L) 05/26/2017   HDL 65 08/15/2016   HDL 37 (L) 01/09/2015   Lab Results  Component Value Date   LDLCALC 54 05/26/2017   LDLCALC 68 08/15/2016   LDLCALC 74 01/09/2015   Lab Results  Component Value Date   TRIG 93 05/26/2017   TRIG 50 08/15/2016   TRIG 119 01/09/2015   Lab Results  Component Value Date   CHOLHDL 2.9 05/26/2017   CHOLHDL 2.2 08/15/2016   CHOLHDL 3.6 01/09/2015   No results found for: LDLDIRECT    Radiology: CT Lumbar Spine Wo Contrast  Result Date: 02/13/2020 CLINICAL DATA:  Low back pain, trauma, fall into low back. Acute midline tenderness. EXAM: CT LUMBAR SPINE WITHOUT CONTRAST TECHNIQUE: Multidetector CT imaging of the lumbar spine was performed without intravenous contrast administration. Multiplanar CT image reconstructions were also generated. COMPARISON:  Reformats from abdominal CT 01/24/2020, report from lumbar spine MRI 12/12/2017, images not available FINDINGS: Segmentation: 5 lumbar type vertebrae. Alignment: Normal. Vertebrae: Chronic L1 compression fracture, unchanged from prior exam, with vertebral plasty. No interval progression. The remaining vertebral body heights are normal. No acute fracture. Posterior elements are intact. Prior laminectomies at  L4 and L5. Paraspinal and other soft tissues: Small amount of cement in the left paraspinal musculature adjacent to L1 from prior vertebroplasty. Otherwise negative. Disc levels: Interbody spacer at L4-L5 which is slightly posteriorly positioned, also described on prior exam. Interbody spacer at L5-S1 appear seated. Disc space narrowing at L1-L2. There is mild multilevel facet hypertrophy.  IMPRESSION: 1. No acute fracture or subluxation of the lumbar spine. 2. Chronic L1 compression fracture with vertebral augmentation, unchanged from prior exam. 3. Postsurgical change at L4-L5 and L5-S1 with interbody spacers. L4-L5 interbody cages slightly posteriorly positions, also described on prior MRI and likely chronic. Electronically Signed   By: Keith Rake M.D.   On: 02/13/2020 17:15   CT ABDOMEN PELVIS W CONTRAST  Result Date: 01/24/2020 CLINICAL DATA:  Abdominal pain and fever. EXAM: CT ABDOMEN AND PELVIS WITH CONTRAST TECHNIQUE: Multidetector CT imaging of the abdomen and pelvis was performed using the standard protocol following bolus administration of intravenous contrast. CONTRAST:  2m OMNIPAQUE IOHEXOL 300 MG/ML  SOLN COMPARISON:  CT angiography of the abdomen pelvis 05/23/2019 FINDINGS: Lower chest: Subsegmental atelectasis versus scar noted within the right middle lobe and both posterior lower lobes.6 mm ground-glass nodule is identified within the left lower lobe. This is new when compared with 05/23/2019. Hepatobiliary: Advanced changes of cirrhosis. The liver has a shrunken and nodular appearance. No focal liver abnormality identified. Gallbladder is unremarkable. Tiny stones within the dependent portion of the gallbladder suspected. No signs of biliary ductal dilatation or acute cholecystitis. Pancreas: Unremarkable. No pancreatic ductal dilatation or surrounding inflammatory changes. Spleen: Enlarged measuring 17.5 by 11.3 x 6.5 cm (volume = 670 cm^3) Adrenals/Urinary Tract: Normal appearance of the adrenal glands. No kidney mass or hydronephrosis identified. Urinary bladder is unremarkable. Stomach/Bowel: Stomach appears nondistended. The appendix is visualized and appears normal. No small bowel wall thickening, inflammation or distension. There is mild wall thickening involving the ascending colon, nonspecific in the setting of cirrhosis, ascites and portal venous hypertension.  Sigmoid diverticulosis noted without acute inflammation. Vascular/Lymphatic: Aortic atherosclerosis. No aneurysm. Distal esophageal varices noted. There also large gastric and umbilical varices. No abdominal no pelvic adenopathy. Varices. Reproductive: Status post hysterectomy. No adnexal masses. Other: New upper abdominal ascites overlying the spleen and liver. No discrete fluid collections identified. Musculoskeletal: Previous right hip arthroplasty. Status post vertebral augmentation at L1. Interbody spacers are noted at L4-5 and L5-S1. IMPRESSION: 1. No acute findings identified within the abdomen or pelvis. 2. Advanced changes of cirrhosis and portal venous hypertension. New upper abdominal ascites. 3. New 6 mm ground-glass nodule identified within the left lower lobe. Likely postinflammatory Initial follow-up with CT at 6-12 months is recommended to confirm persistence. If persistent, repeat CT is recommended every 2 years until 5 years of stability has been established. This recommendation follows the consensus statement: Guidelines for Management of Incidental Pulmonary Nodules Detected on CT Images: From the Fleischner Society 2017; Radiology 2017; 284:228-243. 4. Aortic atherosclerosis. Aortic Atherosclerosis (ICD10-I70.0). Electronically Signed   By: TKerby MoorsM.D.   On: 01/24/2020 14:14   UKoreaVenous Img Lower Bilateral (DVT)  Result Date: 01/24/2020 CLINICAL DATA:  Left leg swelling. EXAM: BILATERAL LOWER EXTREMITY VENOUS DOPPLER ULTRASOUND TECHNIQUE: Gray-scale sonography with graded compression, as well as color Doppler and duplex ultrasound were performed to evaluate the lower extremity deep venous systems from the level of the common femoral vein and including the common femoral, femoral, profunda femoral, popliteal and calf veins including the posterior tibial, peroneal and gastrocnemius veins when visible.  The superficial great saphenous vein was also interrogated. Spectral Doppler was  utilized to evaluate flow at rest and with distal augmentation maneuvers in the common femoral, femoral and popliteal veins. COMPARISON:  None. FINDINGS: RIGHT LOWER EXTREMITY Common Femoral Vein: No evidence of thrombus. Normal compressibility, respiratory phasicity and response to augmentation. Saphenofemoral Junction: No evidence of thrombus. Normal compressibility and flow on color Doppler imaging. Profunda Femoral Vein: No evidence of thrombus. Normal compressibility and flow on color Doppler imaging. Femoral Vein: No evidence of thrombus. Normal compressibility, respiratory phasicity and response to augmentation. Popliteal Vein: No evidence of thrombus. Normal compressibility, respiratory phasicity and response to augmentation. Calf Veins: Visualized right deep calf veins are patent without thrombus. Other Findings:  None. LEFT LOWER EXTREMITY Common Femoral Vein: No evidence of thrombus. Normal compressibility, respiratory phasicity and response to augmentation. Saphenofemoral Junction: No evidence of thrombus. Normal compressibility and flow on color Doppler imaging. Profunda Femoral Vein: No evidence of thrombus. Normal compressibility and flow on color Doppler imaging. Femoral Vein: No evidence of thrombus. Normal compressibility, respiratory phasicity and response to augmentation. Popliteal Vein: No evidence of thrombus. Normal compressibility, respiratory phasicity and response to augmentation. Calf Veins: Visualized left deep calf veins are patent without thrombus. Other Findings:  None. IMPRESSION: No evidence of deep venous thrombosis in either lower extremity. Electronically Signed   By: Markus Daft M.D.   On: 01/24/2020 10:54   DG CHEST PORT 1 VIEW  Result Date: 02/13/2020 CLINICAL DATA:  Bilateral leg swelling. EXAM: PORTABLE CHEST 1 VIEW COMPARISON:  02/02/2020 FINDINGS: Post median sternotomy. The lowest most sternal wire is broken, chronic. Cardiomegaly is stable. Vascular congestion.  Possible mild pulmonary edema with suspected basilar septal thickening. Multifocal linear atelectasis or scarring. No confluent consolidation. No pleural fluid. No pneumothorax. No acute osseous abnormalities are seen. IMPRESSION: 1. Cardiomegaly with vascular congestion and possible mild pulmonary edema. 2. Multifocal linear atelectasis or scarring. Electronically Signed   By: Keith Rake M.D.   On: 02/13/2020 18:53   DG Chest Portable 1 View  Result Date: 02/02/2020 CLINICAL DATA:  Volume overload, lower extremity edema EXAM: PORTABLE CHEST 1 VIEW COMPARISON:  01/23/2020 FINDINGS: Single frontal view of the chest demonstrates stable enlarged cardiac silhouette. Postsurgical changes are seen from median sternotomy. There is chronic central vascular congestion without airspace disease, effusion, or pneumothorax. IMPRESSION: 1. Central vascular congestion without edema. Electronically Signed   By: Randa Ngo M.D.   On: 02/02/2020 23:09   DG Chest Portable 1 View  Result Date: 01/23/2020 CLINICAL DATA:  Chest pain EXAM: PORTABLE CHEST 1 VIEW COMPARISON:  05/23/2019, 04/17/2018, 08/14/2016 CT 05/17/2019 FINDINGS: Post sternotomy changes. Cardiomegaly with aortic atherosclerosis. Chronic interstitial thickening. No consolidation or effusion. No pneumothorax. IMPRESSION: Cardiomegaly without overt pulmonary edema, pleural effusion or focal airspace disease. Electronically Signed   By: Donavan Foil M.D.   On: 01/23/2020 19:38   ECHOCARDIOGRAM COMPLETE  Result Date: 01/24/2020    ECHOCARDIOGRAM REPORT   Patient Name:   LAMANDA RUDDER Date of Exam: 01/24/2020 Medical Rec #:  [id number]      Height:       62.0 in Accession #:    [id number]     Weight:       172.6 lb Date of Birth:  October 28, 1950      BSA:          1.796 m Patient Age:    84 years       BP:  107/59 mmHg Patient Gender: F              HR:           94 bpm. Exam Location:  [facility] Procedure: 2D Echo, Cardiac Doppler and Color  Doppler Indications:    Chest Pain 786.50 / R07.9  History:        Patient has prior history of Echocardiogram examinations. CHF,                 CAD, Prior CABG, Arrythmias:Atrial Fibrillation; Risk                 Factors:Hypertension and Dyslipidemia. GERD, Obesity.  Sonographer:    Alvino Chapel RCS Referring Phys: [phone] DAVID TAT IMPRESSIONS  1. Left ventricular ejection fraction, by estimation, is 35 to 40%. The left ventricle has moderately decreased function. The left ventricle demonstrates global hypokinesis. Left ventricular diastolic function could not be evaluated. There is severe hypokinesis of the left ventricular, entire inferior wall and inferolateral wall.  2. Right ventricular systolic function is mildly reduced. The right ventricular size is normal. There is mildly elevated pulmonary artery systolic pressure.  3. Left atrial size was severely dilated.  4. Right atrial size was severely dilated.  5. The mitral valve is normal in structure. Mild to moderate mitral valve regurgitation.  6. Tricuspid valve regurgitation is moderate.  7. The aortic valve is tricuspid. Aortic valve regurgitation is not visualized. Mild aortic valve sclerosis is present, with no evidence of aortic valve stenosis.  8. The inferior vena cava is normal in size with <50% respiratory variability, suggesting right atrial pressure of 8 mmHg. Comparison(s): A prior study was performed on 05/20/2019. The left ventricular function is worsened. The left ventricular wall motion abnormalities are worse. FINDINGS  Left Ventricle: Left ventricular ejection fraction, by estimation, is 35 to 40%. The left ventricle has moderately decreased function. The left ventricle demonstrates global hypokinesis. Severe hypokinesis of the left ventricular, entire inferior wall and inferolateral wall. The left ventricular internal cavity size was normal in size. There is no left ventricular hypertrophy. Abnormal (paradoxical) septal motion consistent  with post-operative status. Left ventricular diastolic function could not be evaluated due to atrial fibrillation. Left ventricular diastolic function could not be evaluated. Right Ventricle: The right ventricular size is normal. No increase in right ventricular wall thickness. Right ventricular systolic function is mildly reduced. There is mildly elevated pulmonary artery systolic pressure. The tricuspid regurgitant velocity  is 2.75 m/s, and with an assumed right atrial pressure of 8 mmHg, the estimated right ventricular systolic pressure is 09.9 mmHg. Left Atrium: Left atrial size was severely dilated. Right Atrium: Right atrial size was severely dilated. Pericardium: There is no evidence of pericardial effusion. Mitral Valve: The mitral valve is normal in structure. Mild mitral annular calcification. Mild to moderate mitral valve regurgitation. Tricuspid Valve: The tricuspid valve is normal in structure. Tricuspid valve regurgitation is moderate. Aortic Valve: The aortic valve is tricuspid. Aortic valve regurgitation is not visualized. Mild aortic valve sclerosis is present, with no evidence of aortic valve stenosis. Pulmonic Valve: The pulmonic valve was normal in structure. Pulmonic valve regurgitation is not visualized. Aorta: The aortic root and ascending aorta are structurally normal, with no evidence of dilitation. Venous: The inferior vena cava is normal in size with less than 50% respiratory variability, suggesting right atrial pressure of 8 mmHg. IAS/Shunts: No atrial level shunt detected by color flow Doppler.  LEFT VENTRICLE PLAX 2D LVIDd:  5.00 cm  Diastology LV PW:         1.10 cm  LV e' medial:    9.57 cm/s LV IVS:        1.00 cm  LV E/e' medial:  16.1 LVOT diam:     1.80 cm  LV e' lateral:   14.30 cm/s LV SV:         56       LV E/e' lateral: 10.8 LV SV Index:   31 LVOT Area:     2.54 cm  RIGHT VENTRICLE RV S prime:     9.25 cm/s TAPSE (M-mode): 2.0 cm LEFT ATRIUM             Index        RIGHT ATRIUM           Index LA Vol (A2C):   86.2 ml 48.00 ml/m RA Area:     23.80 cm LA Vol (A4C):   81.9 ml 45.61 ml/m RA Volume:   75.40 ml  41.99 ml/m LA Biplane Vol: 85.3 ml 47.50 ml/m  AORTIC VALVE LVOT Vmax:   99.20 cm/s LVOT Vmean:  64.300 cm/s LVOT VTI:    0.221 m  AORTA Ao Root diam: 4.90 cm MITRAL VALVE                TRICUSPID VALVE MV Area (PHT): 3.97 cm     TR Peak grad:   30.2 mmHg MV Decel Time: 191 msec     TR Vmax:        275.00 cm/s MV E velocity: 154.00 cm/s                             SHUNTS                             Systemic VTI:  0.22 m                             Systemic Diam: 1.80 cm Dani Gobble Croitoru MD Electronically signed by Sanda Klein MD Signature Date/Time: 01/24/2020/4:45:09 PM    Final    US LIVER DOPPLER  Result Date: 01/27/2020 CLINICAL DATA:  Cirrhosis. EXAM: DUPLEX ULTRASOUND OF LIVER TECHNIQUE: Color and duplex Doppler ultrasound was performed to evaluate the hepatic in-flow and out-flow vessels. COMPARISON:  CT abdomen 01/24/2020 FINDINGS: Liver: Liver is slightly heterogeneous with a nodular contour. Findings are compatible with cirrhosis. No discrete liver lesion. Small amount of perihepatic ascites. Main Portal Vein size: 1.5 cm Portal Vein Velocities Main Prox:  32 cm/sec Main Mid: 26 cm/sec Main Dist:  25 cm/sec Right: 20 cm/sec Left: 80 cm/sec Hepatic Vein Velocities Right:  49 cm/sec Middle:  70 cm/sec Left:  69 cm/sec IVC: Present and patent with normal respiratory phasicity. Hepatic Artery Velocity:  109 cm/sec Splenic Vein Velocity:  24 cm/sec Spleen: 15.8 cm x 15.7 cm x 7.6 cm with a total volume of 991 cm^3 (411 cm^3 is upper limit normal) Portal Vein Occlusion/Thrombus: No Splenic Vein Occlusion/Thrombus: No Ascites: Present Varices: Present Normal hepatofugal flow in the hepatic veins. Normal hepatopetal flow in the portal veins. Enlarged varices at the umbilicus and this corresponds with the previous CT findings. IMPRESSION: 1. Cirrhosis with  portal hypertension based on the ascites, varices and splenomegaly. 2. Portal venous system is patent with normal direction of flow. Electronically  Signed   By: Markus Daft M.D.   On: 01/27/2020 13:52    EKG: ? Atypical flutter chronic LBBB    ASSESSMENT AND PLAN:   1. PAF:  With SSS chronic LBBB first degree. She had a post conversion pause of 6 seconds. Currently appears to be in atypical flutter although its hard to tell from her SR/ST with first degree. No good options. Given comorbidities she is not a good candidate for PPM. 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 Beta blocker has been stopped. Not a candidate for anticoagulation with low PLTls and cirrhosis. DNR poor functional status and doubt she would be a watchman candidate either. For now would observe and consider low dose amiodarone for rhythm control in future   2. 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 Cirrhosis:  PLT 45, Total bilirubin 6.3 MS clear  CT abdomen 01/24/20 with advanced cirrhosis portal venous hypertension and new ascites. Sees Dr Wonda Amis has not had TIPS procedure that I can tell Continue diuretics   4. DNR:  Goals of care suspect she is heading toward being palliative care   Signed: Jenkins Rouge 02/17/2020, 1:02 PM

## 2020-02-17 NOTE — Progress Notes (Addendum)
PROGRESS NOTE  Samantha [name] TDH:[id number] DOB: November 29, 1950 DOA: 02/13/2020 PCP: [name], MD   Brief History: 69 y/o femalewith a history of systolic and diastolic CHF,NASHliver cirrhosis, CKD stage III, hypertension, hyperlipidemia, pancytopenia, paroxysmal atrial fibrillation presenting withworsening lower extremity edema for a period of 2 to 3 weeks. The patient was recently mated to the hospital from 01/23/2020 to 01/28/2020 for abdominal pain and vomiting. During that hospital admission, the patient's torsemide was decreased back to her original dose of 20 mg daily. Since that admission, the patient had complained of gradual worsening of her lower extremity edema. She was admitted again from 02/02/2020 to 02/04/2020 for worsening lower extremity edema. Her torsemide was increased to 40 mg daily at that time. The patient continued to have worsening lower extremity edema. She endorses compliance with fluid intake as well as watching her sodium intake. She endorses compliance with her medications. She went to see her GI physician, Dr. Hassan Buckler 02/11/2020. She was instructed to discontinue her torsemide and start on furosemide 40 mg p.o. twice daily. She has noted increasing urine output, but continued to have worsening lower extremity edema. She states that her breathing is a little worse than usual with some dyspnea on exertion. She denies any fevers, chills, chest pain, coughing, hemoptysis, nausea, vomiting, diarrhea, abdominal pain. Because of her worsening lower extremity edema, the patient has had difficulty ambulating. She had a mechanical fall on 02/13/2020. She she had difficulty getting up. She continued to complain of her back pain which is worse after her fall. Given the culmination of events, the patient presented for further evaluation and treatment. In the emergency department, the patient was afebrile hemodynamically stable with oxygen saturation  97-90% room air. BMP was essentially unremarkable except for potassium 2.8. AST 58, A LT 26, alkaline phosphatase 83, total bilirubin 5.2, albumin 2.7. CBC was unremarkable with her chronic thrombocytopenia. Platelets are 49,000. CT of the lumbar spine was negative for any fracture or dislocation. Chest x-ray showed increased interstitial markings which are largely unchanged. The patient was started on intravenous furosemide.  Assessment/Plan: Decompensated liver cirrhosis/Anasarca -This is the primary driving factor for the patient's worsening lower extremity edema and anasarca -Certainly, there may be a component of cor pulmonale, CHF -01/24/2020 echo did show a decrease from her previous EF -01/24/2020 echo EF 35-40%, global HK, severe HK of the inferior lateral wall, mild decreased RV function, moderate MR, mild increase PASP -05/20/2019 echo EF 50-55% -Continue IV furosemide-->increase to 60 mg bid IV -Her diuresis would be limited by her soft blood pressures as her preload will significantly decrease with diuresis -I had an extensive goals of care discussion with the patient-->changed to DNR -continue spironolactone -check ammonia--35 -d/c dilaudid  Fever -12/18 evening--developed fever 102.7 -UA--neg for pyuria -repeat CXR  Sinus pause -patient had 5.8 sec pause and 2.7 and 2.6 sec pauses on tele this am -optimize electrolytes -obtain EKG -consult cardiology--spoke with Dr. Johnsie Cancel  chronic systolic and diastolic CHF -Certainly, there may be some contribution from the patient's decreased EF, but primary driving factors for decompensated liver cirrhosis -01/24/2020 echo--EF 35-40%, global HK, mild reduced RV -holding metoprolol succinate again due to soft BPs and sinus pauses  Chronic respiratory failure with hypoxia -she's suppose to be on 2L but does not wear her oxygen -due to underlying COPD -place on 2L -aim for saturation 90-92%  Persistent atrial  fibrillation -Currently in sinus rhythm -not a candidate for [facility]  due to cirrhosis, esophageal varices, thrombocytopenia  CKD 3a -baseline creatinine 0.9-1.1 -serum creatinine 1.06 on day of d/c  Coronary artery disease, history of CABG-stable no chest pains.-personally reviewedEKG--no concerningSTTwave changes. -Resume Imdurif BP allows  Hypokalemia -Repleted -Check magnesium--2.1  Hyperlipidemia -Continue Zetiaand statin when able to tolerate po  Pancytopenia -Secondary to liver cirrhosis  Anxiety/depression -Continue fluoxetinewhen able to tolerate po  Back pain -12/16 CT L-spine--neg for fracture or subluxation -IV dilaudid PRN  Lung Nodule -incidential finding of LLL--50m -outpatient surveillance  Goals of Care -Advance care planning, including the explanation and discussion of advance directives was carried out with the patient and family. Code status including explanations of "Full Code" and "DNR" and alternatives were discussed in detail. Discussion of end-of-life issues including but not limited palliative care, hospice care and the concept of hospice, other end-of-life care options, power of attorney for health care decisions, living wills, and physician orders for life-sustaining treatment were also discussed with the patient and family. Total face to face time 16 minutes. -confirmed DNR with patient       Status is: Inpatient  Remains inpatient appropriate because:IV treatments appropriate due to intensity of illness or inability to take PO   Dispo: The patient is from:Home Anticipated d/c is tOY:DXAJAnticipated d/c date is: 3 days Patient currently is not medically stable to d/c.        Family Communication:NoFamily at bedside  Consultants:cardiology  Code Status:DNR  DVT Prophylaxis: SCDs   Procedures: As Listed in Progress Note  Above  Antibiotics: None      Subjective: Patient had some cp earlier today, but none presently.  Denies sob, n/v/d.  She states she felt "funny" earlier during sinus pause.  Denies syncope, n/v/d  Objective: Vitals:   02/16/20 2205 02/17/20 0517 02/17/20 1151 02/17/20 1159  BP: (!) 109/48 (!) 106/55 101/64   Pulse: (!) 110 (!) 106 (!) 108   Resp: 20 20 16    Temp: 99.9 F (37.7 C) 98.7 F (37.1 C) 98.9 F (37.2 C)   TempSrc: Oral Oral Oral   SpO2: (!) 88% (!) 88% (!) 89% 91%  Weight:  87.8 kg    Height:        Intake/Output Summary (Last 24 hours) at 02/17/2020 1224 Last data filed at 02/17/2020 0900 Gross per 24 hour  Intake 1160 ml  Output 1950 ml  Net -790 ml   Weight change:  Exam:   General:  Pt is alert, follows commands appropriately, not in acute distress  HEENT: No icterus, No thrush, No neck mass, [place]/AT  Cardiovascular: RRR, S1/S2, no rubs, no gallops  Respiratory: CTA bilaterally, no wheezing, no crackles, no rhonchi  Abdomen: Soft/+BS, non tender, non distended, no guarding  Extremities: No edema, No lymphangitis, No petechiae, No rashes, no synovitis   Data Reviewed: I have personally reviewed following labs and imaging studies Basic Metabolic Panel: Recent Labs  Lab 02/13/20 1637 02/15/20 0443 02/16/20 0449 02/17/20 0344  NA 137 134* 133* 134*  K 2.8* 4.3 4.5 4.6  CL 104 104 102 101  CO2 24 24 22 22   GLUCOSE 63* 104* 148* 100*  BUN 16 17 22 22   CREATININE 1.04* 1.06* 1.15* 1.15*  CALCIUM 8.9 8.7* 9.3 9.6  MG 2.1 1.9  --  2.1   Liver Function Tests: Recent Labs  Lab 02/13/20 1637 02/15/20 0443  AST 58* 53*  ALT 26 21  ALKPHOS 83 69  BILITOT 5.2* 4.7*  PROT 6.8 6.2*  ALBUMIN 2.7* 3.0*  Recent Labs  Lab 02/13/20 1637  LIPASE 44   No results for input(s): AMMONIA in the last 168 hours. Coagulation Profile: Recent Labs  Lab 02/13/20 1637  INR 1.6*   CBC: Recent Labs  Lab 02/13/20 1637 02/15/20 0443  WBC  5.8 4.7  HGB 12.0 10.3*  HCT 36.5 31.8*  MCV 97.6 100.6*  PLT 49* 41*   Cardiac Enzymes: No results for input(s): CKTOTAL, CKMB, CKMBINDEX, TROPONINI in the last 168 hours. BNP: Invalid input(s): POCBNP CBG: Recent Labs  Lab 02/13/20 2247 02/15/20 2149  GLUCAP 130* 124*   HbA1C: No results for input(s): HGBA1C in the last 72 hours. Urine analysis:    Component Value Date/Time   COLORURINE YELLOW 02/16/2020 1710   APPEARANCEUR CLEAR 02/16/2020 1710   LABSPEC 1.015 02/16/2020 1710   PHURINE 5.0 02/16/2020 1710   GLUCOSEU NEGATIVE 02/16/2020 1710   HGBUR NEGATIVE 02/16/2020 1710   BILIRUBINUR NEGATIVE 02/16/2020 1710   KETONESUR NEGATIVE 02/16/2020 1710   PROTEINUR NEGATIVE 02/16/2020 1710   NITRITE NEGATIVE 02/16/2020 1710   LEUKOCYTESUR NEGATIVE 02/16/2020 1710   Sepsis Labs: @LABRCNTIP (procalcitonin:4,lacticidven:4) ) Recent Results (from the past 240 hour(s))  Resp Panel by RT-PCR (Flu A&B, Covid) Nasopharyngeal Swab     Status: None   Collection Time: 02/13/20  6:00 PM   Specimen: Nasopharyngeal Swab; Nasopharyngeal(NP) swabs in vial transport medium  Result Value Ref Range Status   SARS Coronavirus 2 by RT PCR NEGATIVE NEGATIVE Final    Comment: (NOTE) SARS-CoV-2 target nucleic acids are NOT DETECTED.  The SARS-CoV-2 RNA is generally detectable in upper respiratory specimens during the acute phase of infection. The lowest concentration of SARS-CoV-2 viral copies this assay can detect is 138 copies/mL. A negative result does not preclude SARS-Cov-2 infection and should not be used as the sole basis for treatment or other patient management decisions. A negative result may occur with  improper specimen collection/handling, submission of specimen other than nasopharyngeal swab, presence of viral mutation(s) within the areas targeted by this assay, and inadequate number of viral copies(<138 copies/mL). A negative result must be combined with clinical  observations, patient history, and epidemiological information. The expected result is Negative.  Fact Sheet for Patients:  EntrepreneurPulse.com.au  Fact Sheet for Healthcare Providers:  IncredibleEmployment.be  This test is no t yet approved or cleared by the [place] FDA and  has been authorized for detection and/or diagnosis of SARS-CoV-2 by FDA under an Emergency Use Authorization (EUA). This EUA will remain  in effect (meaning this test can be used) for the duration of the COVID-19 declaration under Section 564(b)(1) of the Act, 21 U.S.C.section 360bbb-3(b)(1), unless the authorization is terminated  or revoked sooner.       Influenza A by PCR NEGATIVE NEGATIVE Final   Influenza B by PCR NEGATIVE NEGATIVE Final    Comment: (NOTE) The Xpert Xpress SARS-CoV-2/FLU/RSV plus assay is intended as an aid in the diagnosis of influenza from Nasopharyngeal swab specimens and should not be used as a sole basis for treatment. Nasal washings and aspirates are unacceptable for Xpert Xpress SARS-CoV-2/FLU/RSV testing.  Fact Sheet for Patients: EntrepreneurPulse.com.au  Fact Sheet for Healthcare Providers: IncredibleEmployment.be  This test is not yet approved or cleared by the [place] FDA and has been authorized for detection and/or diagnosis of SARS-CoV-2 by FDA under an Emergency Use Authorization (EUA). This EUA will remain in effect (meaning this test can be used) for the duration of the COVID-19 declaration under Section 564(b)(1) of the Act, 21 U.S.C.  section 360bbb-3(b)(1), unless the authorization is terminated or revoked.  Performed at [facility], [address], [place], [place] [address]      Scheduled Meds: . ezetimibe  10 mg Oral QHS  . FLUoxetine  20 mg Oral Daily  . furosemide  60 mg Intravenous BID  . pantoprazole  40 mg Oral Daily  . polyethylene glycol  17 g Oral Daily  .  spironolactone  25 mg Oral Daily   Continuous Infusions: . sodium chloride 10 mL/hr at 02/16/20 0806  . albumin human 50 g (02/17/20 1107)    Procedures/Studies: CT Lumbar Spine Wo Contrast  Result Date: 02/13/2020 CLINICAL DATA:  Low back pain, trauma, fall into low back. Acute midline tenderness. EXAM: CT LUMBAR SPINE WITHOUT CONTRAST TECHNIQUE: Multidetector CT imaging of the lumbar spine was performed without intravenous contrast administration. Multiplanar CT image reconstructions were also generated. COMPARISON:  Reformats from abdominal CT 01/24/2020, report from lumbar spine MRI 12/12/2017, images not available FINDINGS: Segmentation: 5 lumbar type vertebrae. Alignment: Normal. Vertebrae: Chronic L1 compression fracture, unchanged from prior exam, with vertebral plasty. No interval progression. The remaining vertebral body heights are normal. No acute fracture. Posterior elements are intact. Prior laminectomies at L4 and L5. Paraspinal and other soft tissues: Small amount of cement in the left paraspinal musculature adjacent to L1 from prior vertebroplasty. Otherwise negative. Disc levels: Interbody spacer at L4-L5 which is slightly posteriorly positioned, also described on prior exam. Interbody spacer at L5-S1 appear seated. Disc space narrowing at L1-L2. There is mild multilevel facet hypertrophy. IMPRESSION: 1. No acute fracture or subluxation of the lumbar spine. 2. Chronic L1 compression fracture with vertebral augmentation, unchanged from prior exam. 3. Postsurgical change at L4-L5 and L5-S1 with interbody spacers. L4-L5 interbody cages slightly posteriorly positions, also described on prior MRI and likely chronic. Electronically Signed   By: Keith Rake M.D.   On: 02/13/2020 17:15   CT ABDOMEN PELVIS W CONTRAST  Result Date: 01/24/2020 CLINICAL DATA:  Abdominal pain and fever. EXAM: CT ABDOMEN AND PELVIS WITH CONTRAST TECHNIQUE: Multidetector CT imaging of the abdomen and pelvis  was performed using the standard protocol following bolus administration of intravenous contrast. CONTRAST:  79m OMNIPAQUE IOHEXOL 300 MG/ML  SOLN COMPARISON:  CT angiography of the abdomen pelvis 05/23/2019 FINDINGS: Lower chest: Subsegmental atelectasis versus scar noted within the right middle lobe and both posterior lower lobes.6 mm ground-glass nodule is identified within the left lower lobe. This is new when compared with 05/23/2019. Hepatobiliary: Advanced changes of cirrhosis. The liver has a shrunken and nodular appearance. No focal liver abnormality identified. Gallbladder is unremarkable. Tiny stones within the dependent portion of the gallbladder suspected. No signs of biliary ductal dilatation or acute cholecystitis. Pancreas: Unremarkable. No pancreatic ductal dilatation or surrounding inflammatory changes. Spleen: Enlarged measuring 17.5 by 11.3 x 6.5 cm (volume = 670 cm^3) Adrenals/Urinary Tract: Normal appearance of the adrenal glands. No kidney mass or hydronephrosis identified. Urinary bladder is unremarkable. Stomach/Bowel: Stomach appears nondistended. The appendix is visualized and appears normal. No small bowel wall thickening, inflammation or distension. There is mild wall thickening involving the ascending colon, nonspecific in the setting of cirrhosis, ascites and portal venous hypertension. Sigmoid diverticulosis noted without acute inflammation. Vascular/Lymphatic: Aortic atherosclerosis. No aneurysm. Distal esophageal varices noted. There also large gastric and umbilical varices. No abdominal no pelvic adenopathy. Varices. Reproductive: Status post hysterectomy. No adnexal masses. Other: New upper abdominal ascites overlying the spleen and liver. No discrete fluid collections identified. Musculoskeletal: Previous right hip  arthroplasty. Status post vertebral augmentation at L1. Interbody spacers are noted at L4-5 and L5-S1. IMPRESSION: 1. No acute findings identified within the abdomen  or pelvis. 2. Advanced changes of cirrhosis and portal venous hypertension. New upper abdominal ascites. 3. New 6 mm ground-glass nodule identified within the left lower lobe. Likely postinflammatory Initial follow-up with CT at 6-12 months is recommended to confirm persistence. If persistent, repeat CT is recommended every 2 years until 5 years of stability has been established. This recommendation follows the consensus statement: Guidelines for Management of Incidental Pulmonary Nodules Detected on CT Images: From the Fleischner Society 2017; Radiology 2017; 284:228-243. 4. Aortic atherosclerosis. Aortic Atherosclerosis (ICD10-I70.0). Electronically Signed   By: Kerby Moors M.D.   On: 01/24/2020 14:14   US Venous Img Lower Bilateral (DVT)  Result Date: 01/24/2020 CLINICAL DATA:  Left leg swelling. EXAM: BILATERAL LOWER EXTREMITY VENOUS DOPPLER ULTRASOUND TECHNIQUE: Gray-scale sonography with graded compression, as well as color Doppler and duplex ultrasound were performed to evaluate the lower extremity deep venous systems from the level of the common femoral vein and including the common femoral, femoral, profunda femoral, popliteal and calf veins including the posterior tibial, peroneal and gastrocnemius veins when visible. The superficial great saphenous vein was also interrogated. Spectral Doppler was utilized to evaluate flow at rest and with distal augmentation maneuvers in the common femoral, femoral and popliteal veins. COMPARISON:  None. FINDINGS: RIGHT LOWER EXTREMITY Common Femoral Vein: No evidence of thrombus. Normal compressibility, respiratory phasicity and response to augmentation. Saphenofemoral Junction: No evidence of thrombus. Normal compressibility and flow on color Doppler imaging. Profunda Femoral Vein: No evidence of thrombus. Normal compressibility and flow on color Doppler imaging. Femoral Vein: No evidence of thrombus. Normal compressibility, respiratory phasicity and response to  augmentation. Popliteal Vein: No evidence of thrombus. Normal compressibility, respiratory phasicity and response to augmentation. Calf Veins: Visualized right deep calf veins are patent without thrombus. Other Findings:  None. LEFT LOWER EXTREMITY Common Femoral Vein: No evidence of thrombus. Normal compressibility, respiratory phasicity and response to augmentation. Saphenofemoral Junction: No evidence of thrombus. Normal compressibility and flow on color Doppler imaging. Profunda Femoral Vein: No evidence of thrombus. Normal compressibility and flow on color Doppler imaging. Femoral Vein: No evidence of thrombus. Normal compressibility, respiratory phasicity and response to augmentation. Popliteal Vein: No evidence of thrombus. Normal compressibility, respiratory phasicity and response to augmentation. Calf Veins: Visualized left deep calf veins are patent without thrombus. Other Findings:  None. IMPRESSION: No evidence of deep venous thrombosis in either lower extremity. Electronically Signed   By: Markus Daft M.D.   On: 01/24/2020 10:54   DG CHEST PORT 1 VIEW  Result Date: 02/13/2020 CLINICAL DATA:  Bilateral leg swelling. EXAM: PORTABLE CHEST 1 VIEW COMPARISON:  02/02/2020 FINDINGS: Post median sternotomy. The lowest most sternal wire is broken, chronic. Cardiomegaly is stable. Vascular congestion. Possible mild pulmonary edema with suspected basilar septal thickening. Multifocal linear atelectasis or scarring. No confluent consolidation. No pleural fluid. No pneumothorax. No acute osseous abnormalities are seen. IMPRESSION: 1. Cardiomegaly with vascular congestion and possible mild pulmonary edema. 2. Multifocal linear atelectasis or scarring. Electronically Signed   By: Keith Rake M.D.   On: 02/13/2020 18:53   DG Chest Portable 1 View  Result Date: 02/02/2020 CLINICAL DATA:  Volume overload, lower extremity edema EXAM: PORTABLE CHEST 1 VIEW COMPARISON:  01/23/2020 FINDINGS: Single frontal view  of the chest demonstrates stable enlarged cardiac silhouette. Postsurgical changes are seen from median sternotomy. There is chronic central vascular  congestion without airspace disease, effusion, or pneumothorax. IMPRESSION: 1. Central vascular congestion without edema. Electronically Signed   By: Randa Ngo M.D.   On: 02/02/2020 23:09   DG Chest Portable 1 View  Result Date: 01/23/2020 CLINICAL DATA:  Chest pain EXAM: PORTABLE CHEST 1 VIEW COMPARISON:  05/23/2019, 04/17/2018, 08/14/2016 CT 05/17/2019 FINDINGS: Post sternotomy changes. Cardiomegaly with aortic atherosclerosis. Chronic interstitial thickening. No consolidation or effusion. No pneumothorax. IMPRESSION: Cardiomegaly without overt pulmonary edema, pleural effusion or focal airspace disease. Electronically Signed   By: Donavan Foil M.D.   On: 01/23/2020 19:38   ECHOCARDIOGRAM COMPLETE  Result Date: 01/24/2020    ECHOCARDIOGRAM REPORT   Patient Name:   ZILPHA MCANDREW Date of Exam: 01/24/2020 Medical Rec #:  [id number]      Height:       62.0 in Accession #:    [id number]     Weight:       172.6 lb Date of Birth:  08-07-1950      BSA:          1.796 m Patient Age:    38 years       BP:           107/59 mmHg Patient Gender: F              HR:           94 bpm. Exam Location:  [facility] Procedure: 2D Echo, Cardiac Doppler and Color Doppler Indications:    Chest Pain 786.50 / R07.9  History:        Patient has prior history of Echocardiogram examinations. CHF,                 CAD, Prior CABG, Arrythmias:Atrial Fibrillation; Risk                 Factors:Hypertension and Dyslipidemia. GERD, Obesity.  Sonographer:    Alvino Chapel RCS Referring Phys: [phone] Krew Hortman IMPRESSIONS  1. Left ventricular ejection fraction, by estimation, is 35 to 40%. The left ventricle has moderately decreased function. The left ventricle demonstrates global hypokinesis. Left ventricular diastolic function could not be evaluated. There is severe hypokinesis of the left  ventricular, entire inferior wall and inferolateral wall.  2. Right ventricular systolic function is mildly reduced. The right ventricular size is normal. There is mildly elevated pulmonary artery systolic pressure.  3. Left atrial size was severely dilated.  4. Right atrial size was severely dilated.  5. The mitral valve is normal in structure. Mild to moderate mitral valve regurgitation.  6. Tricuspid valve regurgitation is moderate.  7. The aortic valve is tricuspid. Aortic valve regurgitation is not visualized. Mild aortic valve sclerosis is present, with no evidence of aortic valve stenosis.  8. The inferior vena cava is normal in size with <50% respiratory variability, suggesting right atrial pressure of 8 mmHg. Comparison(s): A prior study was performed on 05/20/2019. The left ventricular function is worsened. The left ventricular wall motion abnormalities are worse. FINDINGS  Left Ventricle: Left ventricular ejection fraction, by estimation, is 35 to 40%. The left ventricle has moderately decreased function. The left ventricle demonstrates global hypokinesis. Severe hypokinesis of the left ventricular, entire inferior wall and inferolateral wall. The left ventricular internal cavity size was normal in size. There is no left ventricular hypertrophy. Abnormal (paradoxical) septal motion consistent with post-operative status. Left ventricular diastolic function could not be evaluated due to atrial fibrillation. Left ventricular diastolic function could not be evaluated. Right Ventricle: The  right ventricular size is normal. No increase in right ventricular wall thickness. Right ventricular systolic function is mildly reduced. There is mildly elevated pulmonary artery systolic pressure. The tricuspid regurgitant velocity  is 2.75 m/s, and with an assumed right atrial pressure of 8 mmHg, the estimated right ventricular systolic pressure is 49.2 mmHg. Left Atrium: Left atrial size was severely dilated. Right  Atrium: Right atrial size was severely dilated. Pericardium: There is no evidence of pericardial effusion. Mitral Valve: The mitral valve is normal in structure. Mild mitral annular calcification. Mild to moderate mitral valve regurgitation. Tricuspid Valve: The tricuspid valve is normal in structure. Tricuspid valve regurgitation is moderate. Aortic Valve: The aortic valve is tricuspid. Aortic valve regurgitation is not visualized. Mild aortic valve sclerosis is present, with no evidence of aortic valve stenosis. Pulmonic Valve: The pulmonic valve was normal in structure. Pulmonic valve regurgitation is not visualized. Aorta: The aortic root and ascending aorta are structurally normal, with no evidence of dilitation. Venous: The inferior vena cava is normal in size with less than 50% respiratory variability, suggesting right atrial pressure of 8 mmHg. IAS/Shunts: No atrial level shunt detected by color flow Doppler.  LEFT VENTRICLE PLAX 2D LVIDd:         5.00 cm  Diastology LV PW:         1.10 cm  LV e' medial:    9.57 cm/s LV IVS:        1.00 cm  LV E/e' medial:  16.1 LVOT diam:     1.80 cm  LV e' lateral:   14.30 cm/s LV SV:         56       LV E/e' lateral: 10.8 LV SV Index:   31 LVOT Area:     2.54 cm  RIGHT VENTRICLE RV S prime:     9.25 cm/s TAPSE (M-mode): 2.0 cm LEFT ATRIUM             Index       RIGHT ATRIUM           Index LA Vol (A2C):   86.2 ml 48.00 ml/m RA Area:     23.80 cm LA Vol (A4C):   81.9 ml 45.61 ml/m RA Volume:   75.40 ml  41.99 ml/m LA Biplane Vol: 85.3 ml 47.50 ml/m  AORTIC VALVE LVOT Vmax:   99.20 cm/s LVOT Vmean:  64.300 cm/s LVOT VTI:    0.221 m  AORTA Ao Root diam: 4.90 cm MITRAL VALVE                TRICUSPID VALVE MV Area (PHT): 3.97 cm     TR Peak grad:   30.2 mmHg MV Decel Time: 191 msec     TR Vmax:        275.00 cm/s MV E velocity: 154.00 cm/s                             SHUNTS                             Systemic VTI:  0.22 m                             Systemic Diam:  1.80 cm Dani Gobble Croitoru MD Electronically signed by Sanda Klein MD Signature Date/Time: 01/24/2020/4:45:09 PM    Final  US LIVER DOPPLER  Result Date: 01/27/2020 CLINICAL DATA:  Cirrhosis. EXAM: DUPLEX ULTRASOUND OF LIVER TECHNIQUE: Color and duplex Doppler ultrasound was performed to evaluate the hepatic in-flow and out-flow vessels. COMPARISON:  CT abdomen 01/24/2020 FINDINGS: Liver: Liver is slightly heterogeneous with a nodular contour. Findings are compatible with cirrhosis. No discrete liver lesion. Small amount of perihepatic ascites. Main Portal Vein size: 1.5 cm Portal Vein Velocities Main Prox:  32 cm/sec Main Mid: 26 cm/sec Main Dist:  25 cm/sec Right: 20 cm/sec Left: 80 cm/sec Hepatic Vein Velocities Right:  49 cm/sec Middle:  70 cm/sec Left:  69 cm/sec IVC: Present and patent with normal respiratory phasicity. Hepatic Artery Velocity:  109 cm/sec Splenic Vein Velocity:  24 cm/sec Spleen: 15.8 cm x 15.7 cm x 7.6 cm with a total volume of 991 cm^3 (411 cm^3 is upper limit normal) Portal Vein Occlusion/Thrombus: No Splenic Vein Occlusion/Thrombus: No Ascites: Present Varices: Present Normal hepatofugal flow in the hepatic veins. Normal hepatopetal flow in the portal veins. Enlarged varices at the umbilicus and this corresponds with the previous CT findings. IMPRESSION: 1. 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 2. Portal venous system is patent with normal direction of flow. Electronically Signed   By: Markus Daft M.D.   On: 01/27/2020 13:52    Orson Eva, DO  [facility]  If 7PM-7AM, please contact night-coverage [email or web address] Password [email or web address] 02/17/2020, 12:24 PM   LOS: 4 days

## 2020-02-17 NOTE — Plan of Care (Signed)

## 2020-02-17 NOTE — TOC Progression Note (Signed)
Transition of Care [facility]) - Progression Note    Patient Details  Name: Samantha [name] MRN: [id number] Date of Birth: 08-02-1950  [facility] CM/SW Contact  Natasha Bence, LCSW Phone Number: 02/17/2020, 3:40 PM  Clinical Narrative:    CSW received notification of [place] recommendation. CSW spoke with patient's sister who reported patient is agreeable to [facility] services. CSW placed referral with Georgina Snell from [facility]. Georgina Snell agreeable to take patient. TOC to follow.    Expected Discharge Plan: Home/Self Care Barriers to Discharge: Continued Medical Work up  Expected Discharge Plan and Services Expected Discharge Plan: Home/Self Care In-house Referral: Clinical Social Work Discharge Planning Services: CM Consult Post Acute Care Choice: NA Living arrangements for the past 2 months: Single Family Home                 DME Arranged: N/A DME Agency: NA       HH Arranged: NA HH Agency: NA         Social Determinants of Health (SDOH) Interventions    Readmission Risk Interventions No flowsheet data found.

## 2020-02-18 DIAGNOSIS — J189 Pneumonia, unspecified organism: Secondary | ICD-10-CM

## 2020-02-18 DIAGNOSIS — J181 Lobar pneumonia, unspecified organism: Secondary | ICD-10-CM

## 2020-02-18 DIAGNOSIS — I5043 Acute on chronic combined systolic (congestive) and diastolic (congestive) heart failure: Secondary | ICD-10-CM

## 2020-02-18 LAB — AMMONIA: Ammonia: 40 umol/L — ABNORMAL HIGH (ref 9–35)

## 2020-02-18 LAB — BASIC METABOLIC PANEL
Anion gap: 10 (ref 5–15)
BUN: 24 mg/dL — ABNORMAL HIGH (ref 8–23)
CO2: 24 mmol/L (ref 22–32)
Calcium: 9.3 mg/dL (ref 8.9–10.3)
Chloride: 100 mmol/L (ref 98–111)
Creatinine, Ser: 1.26 mg/dL — ABNORMAL HIGH (ref 0.44–1.00)
GFR, Estimated: 46 mL/min — ABNORMAL LOW (ref >60)
Glucose, Bld: 100 mg/dL — ABNORMAL HIGH (ref 70–99)
Potassium: 4.4 mmol/L (ref 3.5–5.1)
Sodium: 134 mmol/L — ABNORMAL LOW (ref 135–145)

## 2020-02-18 LAB — URINE CULTURE: Culture: <10000 — AB

## 2020-02-18 LAB — PROCALCITONIN: Procalcitonin: 0.34 ng/mL

## 2020-02-18 LAB — MAGNESIUM: Magnesium: 2.2 mg/dL (ref 1.7–2.4)

## 2020-02-18 MED ORDER — SODIUM CHLORIDE 0.9 % IV SOLN
1000.0000 mg | Freq: Two times a day (BID) | INTRAVENOUS | Status: DC
  Administered 2020-02-18 – 2020-02-19 (×3): 1000 mg via INTRAVENOUS
  Filled 2020-02-18 (×3): qty 1

## 2020-02-18 NOTE — Plan of Care (Signed)

## 2020-02-18 NOTE — Progress Notes (Signed)
Physical Therapy Treatment Patient Details Name: Samantha [name] MRN: [id number] DOB: [birth year] Today's Date: 02/18/2020    History of Present Illness Samantha [name] is a 69 y.o. female with medical history significant for  CABG x3, non alcoholic liver disease, atria fibrillation, HTN.  Patient presented to the ED with complaints of bilateral lower extremity swelling.  This has been an ongoing issue for several weeks to months, with hospitalizations 11/25-11/30 and 12/6 - 12/7.  Patient reports compliance with torsemide, she was switched to Lasix yesterday.  She reports improvement in urine output with Lasix.  But currently patient is unable to ambulate due to significant bilateral lower extremity swelling.  She is having weeping and blisters on her right lower extremities.  She denies abdominal bloating.  She reports she fell onto her back and is reporting lower back pain, did not hit head.    PT Comments    Patient present in room with visitor and awake after previous failed attempts, one of which the patient fell asleep during ankle pumps 3 min into the visit. Patient stated she was not getting out of bed but would participate in bed exercises, detailed below. Demo increased fatigue and weakness throughout all detailed exercises, especially those that required mass muscle movements (heel slides, hip abd/add, etc.). Patient required verbal cues for pursed lipped breathing throughout session secondary to increased fatigue and ensuring O2Sat did not drop. She demoed increased fatigue by end of 10 minutes, falling asleep after completing 2 reps of elbow flexion/extension, but was responsive to PT and visitor voice to complete 10 reps then was allowed to sleep. Patient was informed that PT would be walking with her tomorrow. Nurse tech entered as PT was leaving to take vitals, PT stayed to ensure patient's O2Sat was in appropriate levels with 2L nasal cannula on after finishing PT exercises.  Patient will  benefit from continued physical therapy in hospital and recommended venue below to increase strength, balance, endurance for safe ADLs and gait.   Follow Up Recommendations  Home health PT;Supervision for mobility/OOB;Supervision - Intermittent     Equipment Recommendations  None recommended by PT    Recommendations for Other Services       Precautions / Restrictions Precautions Precautions: Fall Restrictions Weight Bearing Restrictions: No    Mobility  Bed Mobility               General bed mobility comments: remained in bed per patient request with HOB elevated.  Transfers                 General transfer comment: did not stand despite motivated by PT to participate  Ambulation/Gait                 Stairs             Wheelchair Mobility    Modified Rankin (Stroke Patients Only)       Balance                                            Cognition Arousal/Alertness: Awake/alert Behavior During Therapy: WFL for tasks assessed/performed Overall Cognitive Status: Within Functional Limits for tasks assessed  Exercises Total Joint Exercises Ankle Circles/Pumps: AROM;Strengthening;Both;10 reps;Supine Quad Sets: AROM;Strengthening;Both;5 reps;Supine Gluteal Sets: AROM;Strengthening;Both;10 reps;Supine Heel Slides: AROM;Strengthening;Both;10 reps;Supine Hip ABduction/ADduction: AROM;Strengthening;Both;10 reps;Supine General Exercises - Upper Extremity Elbow Flexion: AROM;Strengthening;Both;10 reps;Seated Elbow Extension: AROM;Strengthening;Both;10 reps;Seated    General Comments        Pertinent Vitals/Pain Pain Assessment: No/denies pain Faces Pain Scale: No hurt    Home Living                      Prior Function            PT Goals (current goals can now be found in the care plan section) Acute Rehab PT Goals Patient Stated Goal: return  home PT Goal Formulation: With patient Time For Goal Achievement: 03/02/20 Potential to Achieve Goals: Good Progress towards PT goals: Progressing toward goals    Frequency    Min 3X/week      PT Plan Current plan remains appropriate    Co-evaluation              AM-PAC PT "6 Clicks" Mobility   Outcome Measure  Help needed turning from your back to your side while in a flat bed without using bedrails?: A Lot Help needed moving from lying on your back to sitting on the side of a flat bed without using bedrails?: A Lot Help needed moving to and from a bed to a chair (including a wheelchair)?: A Little Help needed standing up from a chair using your arms (e.g., wheelchair or bedside chair)?: A Little Help needed to walk in hospital room?: A Little Help needed climbing 3-5 steps with a railing? : A Lot 6 Click Score: 15    End of Session   Activity Tolerance: Patient tolerated treatment well;Patient limited by fatigue;Patient limited by lethargy Patient left: in bed;with call bell/phone within reach;with bed alarm set;with family/visitor present Nurse Communication: Mobility status PT Visit Diagnosis: Unsteadiness on feet (R26.81);Other abnormalities of gait and mobility (R26.89);Muscle weakness (generalized) (M62.81)     Time: 1345-1400 PT Time Calculation (min) (ACUTE ONLY): 15 min  Charges:  $Therapeutic Exercise: 8-22 mins                     2:39 PM,02/18/20 Domenic Moras, PT, DPT Physical Therapist at [facility]

## 2020-02-18 NOTE — Progress Notes (Addendum)
Progress Note  Patient Name: Samantha [name] Date of Encounter: 02/18/2020  Primary Cardiologist: Sherren Mocha, MD   Subjective   Reports not feeling well. Unable to elaborate on symptoms, just says she feels "yucky". No specific chest pain or palpitations. Still reports dyspnea and lower extremity edema.   Inpatient Medications    Scheduled Meds: . ezetimibe  10 mg Oral QHS  . FLUoxetine  20 mg Oral Daily  . furosemide  60 mg Intravenous BID  . pantoprazole  40 mg Oral Daily  . polyethylene glycol  17 g Oral Daily  . spironolactone  25 mg Oral Daily   Continuous Infusions: . sodium chloride 10 mL/hr at 02/16/20 0806  . meropenem (MERREM) IV 1,000 mg (02/18/20 0933)   PRN Meds: sodium chloride, acetaminophen **OR** acetaminophen, cyclobenzaprine, oxyCODONE, phenol   Vital Signs    Vitals:   02/17/20 1159 02/17/20 1347 02/17/20 2045 02/18/20 0514  BP:  (!) 102/47 (!) 98/45 (!) 111/51  Pulse:  (!) 108 98 95  Resp:  19 18 20   Temp:  98.8 F (37.1 C) 99.1 F (37.3 C) 98.1 F (36.7 C)  TempSrc:  Oral  Oral  SpO2: 91% 93% 93% 93%  Weight:    86 kg  Height:        Intake/Output Summary (Last 24 hours) at 02/18/2020 1001 Last data filed at 02/18/2020 0500 Gross per 24 hour  Intake 850 ml  Output 1100 ml  Net -250 ml    Last 3 Weights 02/18/2020 02/17/2020 02/15/2020  Weight (lbs) 189 lb 9.5 oz 193 lb 9 oz 192 lb 14.4 oz  Weight (kg) 86 kg 87.8 kg 87.5 kg      Telemetry    Atrial fibrillation yesterday and post-conversion pause of 6.5 seconds. Current rhythm appears most consistent with sinus rhythm with 1st degree but questionable flutter waves at times  - Personally Reviewed  ECG    Sinus tachycardia, HR 106 with 1st degree AV Block and known LBBB. - Personally Reviewed  Physical Exam   General: Well developed, elderly female appearing in no acute distress. Head: Normocephalic, atraumatic.  Neck: Supple without bruits, JVD at 9 cm. Lungs:  Resp  regular and unlabored, decreased breath sounds along bases. Heart: RRR, S1, S2, no S3, S4, or murmur; no rub. Abdomen: Soft, non-tender, non-distended with normoactive bowel sounds. No hepatomegaly. No rebound/guarding. No obvious abdominal masses. Extremities: No clubbing or cyanosis, 1+ pitting edema bilaterally. Distal pedal pulses are 2+ bilaterally. Neuro: Alert and oriented X 3. Moves all extremities spontaneously. Psych: Normal affect.  Labs    Chemistry Recent Labs  Lab 02/13/20 1637 02/15/20 0443 02/16/20 0449 02/17/20 0344 02/17/20 1219 02/18/20 0544  NA 137 134* 133* 134*  --  134*  K 2.8* 4.3 4.5 4.6  --  4.4  CL 104 104 102 101  --  100  CO2 24 24 22 22   --  24  GLUCOSE 63* 104* 148* 100*  --  100*  BUN 16 17 22 22   --  24*  CREATININE 1.04* 1.06* 1.15* 1.15*  --  1.26*  CALCIUM 8.9 8.7* 9.3 9.6  --  9.3  PROT 6.8 6.2*  --   --  6.9  --   ALBUMIN 2.7* 3.0*  --   --  4.1  --   AST 58* 53*  --   --  42*  --   ALT 26 21  --   --  18  --   ALKPHOS  83 69  --   --  43  --   BILITOT 5.2* 4.7*  --   --  6.3*  --   GFRNONAA 57* 56* 51* 51*  --  46*  ANIONGAP 9 6 9 11   --  10     Hematology Recent Labs  Lab 02/13/20 1637 02/15/20 0443 02/17/20 1219  WBC 5.8 4.7 5.3  RBC 3.74* 3.16* 3.06*  HGB 12.0 10.3* 9.9*  HCT 36.5 31.8* 30.9*  MCV 97.6 100.6* 101.0*  MCH 32.1 32.6 32.4  MCHC 32.9 32.4 32.0  RDW 18.2* 18.6* 18.5*  PLT 49* 41* 45*    Cardiac EnzymesNo results for input(s): TROPONINI in the last 168 hours. No results for input(s): TROPIPOC in the last 168 hours.   BNP Recent Labs  Lab 02/13/20 1637  BNP 155.0*     DDimer No results for input(s): DDIMER in the last 168 hours.   Radiology    DG CHEST PORT 1 VIEW  Result Date: 02/17/2020 CLINICAL DATA:  Shortness of breath EXAM: PORTABLE CHEST 1 VIEW COMPARISON:  02/13/2020 FINDINGS: Post CABG changes. Stable cardiomegaly. Atherosclerotic calcification of the aortic knob. Pulmonary vascular  congestion with increasing interstitial and alveolar opacities. Bandlike opacities within the left mid lung. No pleural effusion or pneumothorax. IMPRESSION: 1. Findings suggestive of CHF with pulmonary edema. Superimposed infectious process would be difficult to exclude. 2. Bandlike opacities within the left mid lung may reflect atelectasis versus pneumonia. Electronically Signed   By: Davina Poke D.O.   On: 02/17/2020 13:40    Cardiac Studies   Echocardiogram: 01/24/2020 IMPRESSIONS    1. Left ventricular ejection fraction, by estimation, is 35 to 40%. The  left ventricle has moderately decreased function. The left ventricle  demonstrates global hypokinesis. Left ventricular diastolic function could  not be evaluated. There is severe  hypokinesis of the left ventricular, entire inferior wall and  inferolateral wall.  2. Right ventricular systolic function is mildly reduced. The right  ventricular size is normal. There is mildly elevated pulmonary artery  systolic pressure.  3. Left atrial size was severely dilated.  4. Right atrial size was severely dilated.  5. The mitral valve is normal in structure. Mild to moderate mitral valve  regurgitation.  6. Tricuspid valve regurgitation is moderate.  7. The aortic valve is tricuspid. Aortic valve regurgitation is not  visualized. Mild aortic valve sclerosis is present, with no evidence of  aortic valve stenosis.  8. The inferior vena cava is normal in size with <50% respiratory  variability, suggesting right atrial pressure of 8 mmHg.   Patient Profile     69 y.o. female w/ PMH of CAD (s/p CABG in 2001 with multiple PCI procedures since), chronic combined systolic and diastolic CHF (EF 40-34% by echo in 12/2019), persistent atrial fibrillation/flutter (not on anticoagulation due to esophageal varices and cirrhosis), NASH cirhosis, HTN, HLD,and OA who is currently admitted for volume overloaded secondary to cirrhosis and CHF.    Assessment & Plan    1. Acute on Chronic Combined Systolic and Diastolic CHF Exacerbation - She has a known reduced EF of 35-40% by echo in 12/2019 and medical management was recommended at that time as she was not a candidate for antiplatelet medications given her cirrhosis, varices and thrombocytopenia.  - Weight was at 172 lbs in 12/2019, elevated to 192 lbs this admission and BNP at 155 and CXR showed vascular congestion and possible mild pulmonary edema. - She has been receiving IV Lasix 101m BID  along with Albumin per GI recommendations. I&O's not fully recorded but weight down to 189 lbs today. Still volume overloaded on examination and weight 15+ lbs above her prior baseline. Will review with MD about further titration of Lasix to 1m BID but will need to follow renal function closely given her Stage 3 CKD.  - She was previously on Toprol-XL 12.569mdaily and Spironolactone 2524maily but Toprol-XL has now been discontinued given her post-conversion pause as outlined below. BP does not allow for ACE-I/ARB at this time (BP at 98/45 - 111/51 within the past 24 hours) and she is also listed as being allergic to ACE-I and Entresto in the past.   2. Paroxysmal Atrial Fibrillation/Flutter with Post-Conversion Pauses up to 6.5 Seconds - Was previously in atrial flutter but had a post-conversion pause on 12/20 which lasted 6.5 seconds. Evaluated by Dr. NisJohnsie Canceld reported feeling "funny" but denied any syncopal episodes. Not felt to be a PPM candidate given her comorbidities. Toprol-XL has been discontinued. No acute indication for Amiodarone at this time and also not ideal long-term given her underlying liver issues.  - She is not on anticoagulation due to her cirrhosis, esophageal varices and thrombocytopenia.    3. CAD - She is s/p CABG in 2001 with multiple PCI procedures since. No recent chest pain. She is not on antiplatelet medication given reasons listed above. She was on Imdur 34m52mily  prior to admission and would consider resuming once BP stabilizes. Continue Zetia.   4. HTN - BP has been soft at 98/45- 111/51 within the past 24 hours. Remains on Spironolactone 25mg34mly.   5. Cirrhosis - Followed by Dr. RehmaLaural Goldenn outpatient.   For questions or updates, please contact [facility] consult [email or web address] for contact info under Cardiology/STEMI.   SigneArna Medici-C 10:01 AM 02/18/2020 Pager: [phone] note:  Cardiology consultation note from Dr. NishaJohnsie Cancelewed.  Case discussed with Ms. StradAhmed Prima, I reviewed agree with her above findings.  Although intake and output incomplete, weight is decreasing on IV Lasix, still not back to baseline.  She remains in sinus rhythm by telemetry.  Systolic blood pressure [id number].  Heart rate in the 90s.  Pertinent lab work includes creatinine 1.26, potassium 4.4.  As discussed above, medical therapy is quite limited in terms of her cardiomyopathy.  She is presently on Aldactone, IV Lasix, and Zetia.  Hold off on AV nodal blockers since recent posttermination pause after atrial fibrillation/flutter.  Not ideal candidate for amiodarone either.  SamueSatira Sark., F.A.C.C.

## 2020-02-18 NOTE — Progress Notes (Signed)
PROGRESS NOTE  Samantha [name] HFW:[id number] DOB: Feb 06, 1951 DOA: 02/13/2020 PCP: [name], MD  Brief History: 69 y/o femalewith a history of systolic and diastolic CHF,NASHliver cirrhosis, CKD stage III, hypertension, hyperlipidemia, pancytopenia, paroxysmal atrial fibrillation presenting withworsening lower extremity edema for a period of 2 to 3 weeks. The patient was recently mated to the hospital from 01/23/2020 to 01/28/2020 for abdominal pain and vomiting. During that hospital admission, the patient's torsemide was decreased back to her original dose of 20 mg daily. Since that admission, the patient had complained of gradual worsening of her lower extremity edema. She was admitted again from 02/02/2020 to 02/04/2020 for worsening lower extremity edema. Her torsemide was increased to 40 mg daily at that time. The patient continued to have worsening lower extremity edema. She endorses compliance with fluid intake as well as watching her sodium intake. She endorses compliance with her medications. She went to see her GI physician, Dr. Hassan Buckler 02/11/2020. She was instructed to discontinue her torsemide and start on furosemide 40 mg p.o. twice daily. She has noted increasing urine output, but continued to have worsening lower extremity edema. She states that her breathing is a little worse than usual with some dyspnea on exertion. She denies any fevers, chills, chest pain, coughing, hemoptysis, nausea, vomiting, diarrhea, abdominal pain. Because of her worsening lower extremity edema, the patient has had difficulty ambulating. She had a mechanical fall on 02/13/2020. She she had difficulty getting up. She continued to complain of her back pain which is worse after her fall. Given the culmination of events, the patient presented for further evaluation and treatment. In the emergency department, the patient was afebrile hemodynamically stable with oxygen saturation  97-90% room air. BMP was essentially unremarkable except for potassium 2.8. AST 58, A LT 26, alkaline phosphatase 83, total bilirubin 5.2, albumin 2.7. CBC was unremarkable with her chronic thrombocytopenia. Platelets are 49,000. CT of the lumbar spine was negative for any fracture or dislocation. Chest x-ray showed increased interstitial markings which are largely unchanged. The patient was started on intravenous furosemide.  Assessment/Plan: Decompensated liver cirrhosis/Anasarca -This is the primary driving factor for the patient's worsening lower extremity edema and anasarca -Certainly, there may be a component of cor pulmonale, CHF -01/24/2020 echo did show a decrease from her previous EF -01/24/2020 echo EF 35-40%, global HK, severe HK of the inferior lateral wall, mild decreased RV function, moderate MR, mild increase PASP -05/20/2019 echo EF 50-55% -Continue IV furosemide-->increase to 60 mg bid IV -Her diuresis would be limited by her soft blood pressures as her preload will significantly decrease with diuresis -I had an extensive goals of care discussion with the patient-->changed to DNR -continue spironolactone -check ammonia--35>>40 -d/c dilaudid  Fever/HCAP -12/18 evening--developed fever 102.7 -UA--neg for pyuria -repeat CXR--personally reviewed-->increase RLL and LLL opacity -start merrem to cover HCAP  Sinus pause -patient had 5.8 sec pause and 2.7 and 2.6 sec pauses on tele due to post conversion pause on 12/202/21 -avoid BB and AVN agents -optimize electrolytes -cardiology consult appreciated  Acute on chronic systolic and diastolic CHF -Certainly, there may be some contribution from the patient's decreased EF, but primary driving factors for decompensated liver cirrhosis -01/24/2020 echo--EF 35-40%, global HK, mild reduced RV -holding metoprolol succinate again due to soft BPs and sinus pauses   Hepatic Encephalopathy -pt more somnolent -ammonia =  40 -start lactulose  Chronic respiratory failure with hypoxia -she's suppose to be on 2L but  does not wear her oxygen -due to underlying COPD -place on 2L -aim for saturation 90-92%  Paroxysmal atrial fibrillation -Currently in sinus rhythm -not a candidate for [facility] due to cirrhosis, esophageal varices, thrombocytopenia  CKD 3a -baseline creatinine 0.9-1.1 -serum creatinine 1.06 on day of d/c  Coronary artery disease, history of CABG-stable no chest pains.-personally reviewedEKG--no concerningSTTwave changes. -Resume Imdurif BP allows  Hypokalemia -Repleted -Check magnesium--2.1  Hyperlipidemia -Continue Zetiaand statin when able to tolerate po  Pancytopenia -Secondary to liver cirrhosis  Anxiety/depression -Continue fluoxetinewhen able to tolerate po  Back pain -12/16 CT L-spine--neg for fracture or subluxation -IV dilaudid PRN, po oxycodone  Lung Nodule -incidential finding of LLL--61m -outpatient surveillance  Goals of Care -Advance care planning, including the explanation and discussion of advance directives was carried out with the patient and family. Code status including explanations of "Full Code" and "DNR" and alternatives were discussed in detail. Discussion of end-of-life issues including but not limited palliative care, hospice care and the concept of hospice, other end-of-life care options, power of attorney for health care decisions, living wills, and physician orders for life-sustaining treatment were also discussed with the patient and family. Total face to face time 16 minutes. -confirmed DNR with patient -12/21--discussed with patient and daughter in law-->possible d/c home with hospice soon -palliative medicine consulted       Status is: Inpatient  Remains inpatient appropriate because:IV treatments appropriate due to intensity of illness or inability to take PO   Dispo: The patient is  from:Home Anticipated d/c is tKW:IOXBAnticipated d/c date is: 3 days Patient currently is not medically stable to d/c.        Family Communication:daughter in law at bedside 12/212/21  Consultants:cardiology  Code Status:DNR  DVT Prophylaxis: SCDs   Procedures: As Listed in Progress Note Above  Antibiotics: merrem 02/18/20>>    Subjective: Patient complains of sob with exertion.  Denies f,c, cp, n/v/d, abd pain.  Objective: Vitals:   02/18/20 1359 02/18/20 1400 02/18/20 1416 02/18/20 1615  BP: (!) 105/54  (!) 94/53 (!) 93/59  Pulse: (!) 111 (!) 101 (!) 107 (!) 109  Resp: (!) 21 16 20 18   Temp: 98 F (36.7 C)  98.2 F (36.8 C) 98.3 F (36.8 C)  TempSrc: Oral  Oral Oral  SpO2: 95%   94%  Weight:      Height:        Intake/Output Summary (Last 24 hours) at 02/18/2020 1627 Last data filed at 02/18/2020 1500 Gross per 24 hour  Intake 820 ml  Output 2050 ml  Net -1230 ml   Weight change: -1.8 kg Exam:   General:  Pt is alert, follows commands appropriately, not in acute distress  HEENT: No icterus, No thrush, No neck mass, [place]/AT  Cardiovascular: RRR, S1/S2, no rubs, no gallops  Respiratory: bilateral crackles. No wheeze  Abdomen: Soft/+BS, non tender, non distended, no guarding  Extremities: No edema, No lymphangitis, No petechiae, No rashes, no synovitis   Data Reviewed: I have personally reviewed following labs and imaging studies Basic Metabolic Panel: Recent Labs  Lab 02/13/20 1637 02/15/20 0443 02/16/20 0449 02/17/20 0344 02/18/20 0544  NA 137 134* 133* 134* 134*  K 2.8* 4.3 4.5 4.6 4.4  CL 104 104 102 101 100  CO2 24 24 22 22 24   GLUCOSE 63* 104* 148* 100* 100*  BUN 16 17 22 22  24*  CREATININE 1.04* 1.06* 1.15* 1.15* 1.26*  CALCIUM 8.9 8.7* 9.3 9.6 9.3  MG 2.1 1.9  --  2.1 2.2   Liver Function Tests: Recent Labs  Lab 02/13/20 1637 02/15/20 0443 02/17/20 1219   AST 58* 53* 42*  ALT 26 21 18   ALKPHOS 83 69 43  BILITOT 5.2* 4.7* 6.3*  PROT 6.8 6.2* 6.9  ALBUMIN 2.7* 3.0* 4.1   Recent Labs  Lab 02/13/20 1637  LIPASE 44   Recent Labs  Lab 02/17/20 1219 02/18/20 0544  AMMONIA 35 40*   Coagulation Profile: Recent Labs  Lab 02/13/20 1637  INR 1.6*   CBC: Recent Labs  Lab 02/13/20 1637 02/15/20 0443 02/17/20 1219  WBC 5.8 4.7 5.3  HGB 12.0 10.3* 9.9*  HCT 36.5 31.8* 30.9*  MCV 97.6 100.6* 101.0*  PLT 49* 41* 45*   Cardiac Enzymes: No results for input(s): CKTOTAL, CKMB, CKMBINDEX, TROPONINI in the last 168 hours. BNP: Invalid input(s): POCBNP CBG: Recent Labs  Lab 02/13/20 2247 02/15/20 2149  GLUCAP 130* 124*   HbA1C: No results for input(s): HGBA1C in the last 72 hours. Urine analysis:    Component Value Date/Time   COLORURINE YELLOW 02/16/2020 1710   APPEARANCEUR CLEAR 02/16/2020 1710   LABSPEC 1.015 02/16/2020 1710   PHURINE 5.0 02/16/2020 1710   GLUCOSEU NEGATIVE 02/16/2020 1710   HGBUR NEGATIVE 02/16/2020 1710   BILIRUBINUR NEGATIVE 02/16/2020 1710   KETONESUR NEGATIVE 02/16/2020 1710   PROTEINUR NEGATIVE 02/16/2020 1710   NITRITE NEGATIVE 02/16/2020 1710   LEUKOCYTESUR NEGATIVE 02/16/2020 1710   Sepsis Labs: @LABRCNTIP (procalcitonin:4,lacticidven:4) ) Recent Results (from the past 240 hour(s))  Resp Panel by RT-PCR (Flu A&B, Covid) Nasopharyngeal Swab     Status: None   Collection Time: 02/13/20  6:00 PM   Specimen: Nasopharyngeal Swab; Nasopharyngeal(NP) swabs in vial transport medium  Result Value Ref Range Status   SARS Coronavirus 2 by RT PCR NEGATIVE NEGATIVE Final    Comment: (NOTE) SARS-CoV-2 target nucleic acids are NOT DETECTED.  The SARS-CoV-2 RNA is generally detectable in upper respiratory specimens during the acute phase of infection. The lowest concentration of SARS-CoV-2 viral copies this assay can detect is 138 copies/mL. A negative result does not preclude SARS-Cov-2 infection  and should not be used as the sole basis for treatment or other patient management decisions. A negative result may occur with  improper specimen collection/handling, submission of specimen other than nasopharyngeal swab, presence of viral mutation(s) within the areas targeted by this assay, and inadequate number of viral copies(<138 copies/mL). A negative result must be combined with clinical observations, patient history, and epidemiological information. The expected result is Negative.  Fact Sheet for Patients:  EntrepreneurPulse.com.au  Fact Sheet for Healthcare Providers:  IncredibleEmployment.be  This test is no t yet approved or cleared by the [place] FDA and  has been authorized for detection and/or diagnosis of SARS-CoV-2 by FDA under an Emergency Use Authorization (EUA). This EUA will remain  in effect (meaning this test can be used) for the duration of the COVID-19 declaration under Section 564(b)(1) of the Act, 21 U.S.C.section 360bbb-3(b)(1), unless the authorization is terminated  or revoked sooner.       Influenza A by PCR NEGATIVE NEGATIVE Final   Influenza B by PCR NEGATIVE NEGATIVE Final    Comment: (NOTE) The Xpert Xpress SARS-CoV-2/FLU/RSV plus assay is intended as an aid in the diagnosis of influenza from Nasopharyngeal swab specimens and should not be used as a sole basis for treatment. Nasal washings and aspirates are unacceptable for Xpert Xpress SARS-CoV-2/FLU/RSV testing.  Fact Sheet for Patients: EntrepreneurPulse.com.au  Fact Sheet for Healthcare Providers:  IncredibleEmployment.be  This test is not yet approved or cleared by the [place] and has been authorized for detection and/or diagnosis of SARS-CoV-2 by FDA under an Emergency Use Authorization (EUA). This EUA will remain in effect (meaning this test can be used) for the duration of the COVID-19 declaration  under Section 564(b)(1) of the Act, 21 U.S.C. section 360bbb-3(b)(1), unless the authorization is terminated or revoked.  Performed at [facility], [address], [place], [place] [address]   Culture, Urine     Status: Abnormal   Collection Time: 02/16/20  5:10 PM   Specimen: Urine, Clean Catch  Result Value Ref Range Status   Specimen Description   Final    URINE, CLEAN CATCH Performed at [facility], [address], [place], [place] [address]    Special Requests   Final    NONE Performed at [facility], [address] [place], [place] [address]    Culture (A)  Final    <10,000 COLONIES/mL INSIGNIFICANT GROWTH Performed at [facility] [address], [place], [place] [address]    Report Status 02/18/2020 FINAL  Final     Scheduled Meds: . ezetimibe  10 mg Oral QHS  . FLUoxetine  20 mg Oral Daily  . furosemide  60 mg Intravenous BID  . pantoprazole  40 mg Oral Daily  . polyethylene glycol  17 g Oral Daily  . spironolactone  25 mg Oral Daily   Continuous Infusions: . sodium chloride 10 mL/hr at 02/16/20 0806  . meropenem (MERREM) IV 1,000 mg (02/18/20 0933)    Procedures/Studies: CT Lumbar Spine Wo Contrast  Result Date: 02/13/2020 CLINICAL DATA:  Low back pain, trauma, fall into low back. Acute midline tenderness. EXAM: CT LUMBAR SPINE WITHOUT CONTRAST TECHNIQUE: Multidetector CT imaging of the lumbar spine was performed without intravenous contrast administration. Multiplanar CT image reconstructions were also generated. COMPARISON:  Reformats from abdominal CT 01/24/2020, report from lumbar spine MRI 12/12/2017, images not available FINDINGS: Segmentation: 5 lumbar type vertebrae. Alignment: Normal. Vertebrae: Chronic L1 compression fracture, unchanged from prior exam, with vertebral plasty. No interval progression. The remaining vertebral body heights are normal. No acute fracture. Posterior elements are intact. Prior laminectomies at L4 and L5. Paraspinal and  other soft tissues: Small amount of cement in the left paraspinal musculature adjacent to L1 from prior vertebroplasty. Otherwise negative. Disc levels: Interbody spacer at L4-L5 which is slightly posteriorly positioned, also described on prior exam. Interbody spacer at L5-S1 appear seated. Disc space narrowing at L1-L2. There is mild multilevel facet hypertrophy. IMPRESSION: 1. No acute fracture or subluxation of the lumbar spine. 2. Chronic L1 compression fracture with vertebral augmentation, unchanged from prior exam. 3. Postsurgical change at L4-L5 and L5-S1 with interbody spacers. L4-L5 interbody cages slightly posteriorly positions, also described on prior MRI and likely chronic. Electronically Signed   By: Keith Rake M.D.   On: 02/13/2020 17:15   CT ABDOMEN PELVIS W CONTRAST  Result Date: 01/24/2020 CLINICAL DATA:  Abdominal pain and fever. EXAM: CT ABDOMEN AND PELVIS WITH CONTRAST TECHNIQUE: Multidetector CT imaging of the abdomen and pelvis was performed using the standard protocol following bolus administration of intravenous contrast. CONTRAST:  42m OMNIPAQUE IOHEXOL 300 MG/ML  SOLN COMPARISON:  CT angiography of the abdomen pelvis 05/23/2019 FINDINGS: Lower chest: Subsegmental atelectasis versus scar noted within the right middle lobe and both posterior lower lobes.6 mm ground-glass nodule is identified within the left lower lobe. This is new when compared with 05/23/2019. Hepatobiliary:  Advanced changes of cirrhosis. The liver has a shrunken and nodular appearance. No focal liver abnormality identified. Gallbladder is unremarkable. Tiny stones within the dependent portion of the gallbladder suspected. No signs of biliary ductal dilatation or acute cholecystitis. Pancreas: Unremarkable. No pancreatic ductal dilatation or surrounding inflammatory changes. Spleen: Enlarged measuring 17.5 by 11.3 x 6.5 cm (volume = 670 cm^3) Adrenals/Urinary Tract: Normal appearance of the adrenal glands. No  kidney mass or hydronephrosis identified. Urinary bladder is unremarkable. Stomach/Bowel: Stomach appears nondistended. The appendix is visualized and appears normal. No small bowel wall thickening, inflammation or distension. There is mild wall thickening involving the ascending colon, nonspecific in the setting of cirrhosis, ascites and portal venous hypertension. Sigmoid diverticulosis noted without acute inflammation. Vascular/Lymphatic: Aortic atherosclerosis. No aneurysm. Distal esophageal varices noted. There also large gastric and umbilical varices. No abdominal no pelvic adenopathy. Varices. Reproductive: Status post hysterectomy. No adnexal masses. Other: New upper abdominal ascites overlying the spleen and liver. No discrete fluid collections identified. Musculoskeletal: Previous right hip arthroplasty. Status post vertebral augmentation at L1. Interbody spacers are noted at L4-5 and L5-S1. IMPRESSION: 1. No acute findings identified within the abdomen or pelvis. 2. Advanced changes of cirrhosis and portal venous hypertension. New upper abdominal ascites. 3. New 6 mm ground-glass nodule identified within the left lower lobe. Likely postinflammatory Initial follow-up with CT at 6-12 months is recommended to confirm persistence. If persistent, repeat CT is recommended every 2 years until 5 years of stability has been established. This recommendation follows the consensus statement: Guidelines for Management of Incidental Pulmonary Nodules Detected on CT Images: From the Fleischner Society 2017; Radiology 2017; 284:228-243. 4. Aortic atherosclerosis. Aortic Atherosclerosis (ICD10-I70.0). Electronically Signed   By: Kerby Moors M.D.   On: 01/24/2020 14:14   US Venous Img Lower Bilateral (DVT)  Result Date: 01/24/2020 CLINICAL DATA:  Left leg swelling. EXAM: BILATERAL LOWER EXTREMITY VENOUS DOPPLER ULTRASOUND TECHNIQUE: Gray-scale sonography with graded compression, as well as color Doppler and duplex  ultrasound were performed to evaluate the lower extremity deep venous systems from the level of the common femoral vein and including the common femoral, femoral, profunda femoral, popliteal and calf veins including the posterior tibial, peroneal and gastrocnemius veins when visible. The superficial great saphenous vein was also interrogated. Spectral Doppler was utilized to evaluate flow at rest and with distal augmentation maneuvers in the common femoral, femoral and popliteal veins. COMPARISON:  None. FINDINGS: RIGHT LOWER EXTREMITY Common Femoral Vein: No evidence of thrombus. Normal compressibility, respiratory phasicity and response to augmentation. Saphenofemoral Junction: No evidence of thrombus. Normal compressibility and flow on color Doppler imaging. Profunda Femoral Vein: No evidence of thrombus. Normal compressibility and flow on color Doppler imaging. Femoral Vein: No evidence of thrombus. Normal compressibility, respiratory phasicity and response to augmentation. Popliteal Vein: No evidence of thrombus. Normal compressibility, respiratory phasicity and response to augmentation. Calf Veins: Visualized right deep calf veins are patent without thrombus. Other Findings:  None. LEFT LOWER EXTREMITY Common Femoral Vein: No evidence of thrombus. Normal compressibility, respiratory phasicity and response to augmentation. Saphenofemoral Junction: No evidence of thrombus. Normal compressibility and flow on color Doppler imaging. Profunda Femoral Vein: No evidence of thrombus. Normal compressibility and flow on color Doppler imaging. Femoral Vein: No evidence of thrombus. Normal compressibility, respiratory phasicity and response to augmentation. Popliteal Vein: No evidence of thrombus. Normal compressibility, respiratory phasicity and response to augmentation. Calf Veins: Visualized left deep calf veins are patent without thrombus. Other Findings:  None. IMPRESSION: No evidence of deep  venous thrombosis in  either lower extremity. Electronically Signed   By: Markus Daft M.D.   On: 01/24/2020 10:54   DG CHEST PORT 1 VIEW  Result Date: 02/17/2020 CLINICAL DATA:  Shortness of breath EXAM: PORTABLE CHEST 1 VIEW COMPARISON:  02/13/2020 FINDINGS: Post CABG changes. Stable cardiomegaly. Atherosclerotic calcification of the aortic knob. Pulmonary vascular congestion with increasing interstitial and alveolar opacities. Bandlike opacities within the left mid lung. No pleural effusion or pneumothorax. IMPRESSION: 1. Findings suggestive of CHF with pulmonary edema. Superimposed infectious process would be difficult to exclude. 2. Bandlike opacities within the left mid lung may reflect atelectasis versus pneumonia. Electronically Signed   By: Davina Poke D.O.   On: 02/17/2020 13:40   DG CHEST PORT 1 VIEW  Result Date: 02/13/2020 CLINICAL DATA:  Bilateral leg swelling. EXAM: PORTABLE CHEST 1 VIEW COMPARISON:  02/02/2020 FINDINGS: Post median sternotomy. The lowest most sternal wire is broken, chronic. Cardiomegaly is stable. Vascular congestion. Possible mild pulmonary edema with suspected basilar septal thickening. Multifocal linear atelectasis or scarring. No confluent consolidation. No pleural fluid. No pneumothorax. No acute osseous abnormalities are seen. IMPRESSION: 1. Cardiomegaly with vascular congestion and possible mild pulmonary edema. 2. Multifocal linear atelectasis or scarring. Electronically Signed   By: Keith Rake M.D.   On: 02/13/2020 18:53   DG Chest Portable 1 View  Result Date: 02/02/2020 CLINICAL DATA:  Volume overload, lower extremity edema EXAM: PORTABLE CHEST 1 VIEW COMPARISON:  01/23/2020 FINDINGS: Single frontal view of the chest demonstrates stable enlarged cardiac silhouette. Postsurgical changes are seen from median sternotomy. There is chronic central vascular congestion without airspace disease, effusion, or pneumothorax. IMPRESSION: 1. Central vascular congestion without edema.  Electronically Signed   By: Randa Ngo M.D.   On: 02/02/2020 23:09   DG Chest Portable 1 View  Result Date: 01/23/2020 CLINICAL DATA:  Chest pain EXAM: PORTABLE CHEST 1 VIEW COMPARISON:  05/23/2019, 04/17/2018, 08/14/2016 CT 05/17/2019 FINDINGS: Post sternotomy changes. Cardiomegaly with aortic atherosclerosis. Chronic interstitial thickening. No consolidation or effusion. No pneumothorax. IMPRESSION: Cardiomegaly without overt pulmonary edema, pleural effusion or focal airspace disease. Electronically Signed   By: Donavan Foil M.D.   On: 01/23/2020 19:38   ECHOCARDIOGRAM COMPLETE  Result Date: 01/24/2020    ECHOCARDIOGRAM REPORT   Patient Name:   Samantha [name] Date of Exam: 01/24/2020 Medical Rec #:  [id number]      Height:       62.0 in Accession #:    [id number]     Weight:       172.6 lb Date of Birth:  02/03/1951      BSA:          1.796 m Patient Age:    76 years       BP:           107/59 mmHg Patient Gender: F              HR:           94 bpm. Exam Location:  [facility] Procedure: 2D Echo, Cardiac Doppler and Color Doppler Indications:    Chest Pain 786.50 / R07.9  History:        Patient has prior history of Echocardiogram examinations. CHF,                 CAD, Prior CABG, Arrythmias:Atrial Fibrillation; Risk                 Factors:Hypertension and Dyslipidemia. GERD, Obesity.  Sonographer:  Alvino Chapel RCS Referring Phys: [phone] Travarius Lange IMPRESSIONS  1. Left ventricular ejection fraction, by estimation, is 35 to 40%. The left ventricle has moderately decreased function. The left ventricle demonstrates global hypokinesis. Left ventricular diastolic function could not be evaluated. There is severe hypokinesis of the left ventricular, entire inferior wall and inferolateral wall.  2. Right ventricular systolic function is mildly reduced. The right ventricular size is normal. There is mildly elevated pulmonary artery systolic pressure.  3. Left atrial size was severely dilated.  4. Right  atrial size was severely dilated.  5. The mitral valve is normal in structure. Mild to moderate mitral valve regurgitation.  6. Tricuspid valve regurgitation is moderate.  7. The aortic valve is tricuspid. Aortic valve regurgitation is not visualized. Mild aortic valve sclerosis is present, with no evidence of aortic valve stenosis.  8. The inferior vena cava is normal in size with <50% respiratory variability, suggesting right atrial pressure of 8 mmHg. Comparison(s): A prior study was performed on 05/20/2019. The left ventricular function is worsened. The left ventricular wall motion abnormalities are worse. FINDINGS  Left Ventricle: Left ventricular ejection fraction, by estimation, is 35 to 40%. The left ventricle has moderately decreased function. The left ventricle demonstrates global hypokinesis. Severe hypokinesis of the left ventricular, entire inferior wall and inferolateral wall. The left ventricular internal cavity size was normal in size. There is no left ventricular hypertrophy. Abnormal (paradoxical) septal motion consistent with post-operative status. Left ventricular diastolic function could not be evaluated due to atrial fibrillation. Left ventricular diastolic function could not be evaluated. Right Ventricle: The right ventricular size is normal. No increase in right ventricular wall thickness. Right ventricular systolic function is mildly reduced. There is mildly elevated pulmonary artery systolic pressure. The tricuspid regurgitant velocity  is 2.75 m/s, and with an assumed right atrial pressure of 8 mmHg, the estimated right ventricular systolic pressure is 96.0 mmHg. Left Atrium: Left atrial size was severely dilated. Right Atrium: Right atrial size was severely dilated. Pericardium: There is no evidence of pericardial effusion. Mitral Valve: The mitral valve is normal in structure. Mild mitral annular calcification. Mild to moderate mitral valve regurgitation. Tricuspid Valve: The tricuspid  valve is normal in structure. Tricuspid valve regurgitation is moderate. Aortic Valve: The aortic valve is tricuspid. Aortic valve regurgitation is not visualized. Mild aortic valve sclerosis is present, with no evidence of aortic valve stenosis. Pulmonic Valve: The pulmonic valve was normal in structure. Pulmonic valve regurgitation is not visualized. Aorta: The aortic root and ascending aorta are structurally normal, with no evidence of dilitation. Venous: The inferior vena cava is normal in size with less than 50% respiratory variability, suggesting right atrial pressure of 8 mmHg. IAS/Shunts: No atrial level shunt detected by color flow Doppler.  LEFT VENTRICLE PLAX 2D LVIDd:         5.00 cm  Diastology LV PW:         1.10 cm  LV e' medial:    9.57 cm/s LV IVS:        1.00 cm  LV E/e' medial:  16.1 LVOT diam:     1.80 cm  LV e' lateral:   14.30 cm/s LV SV:         56       LV E/e' lateral: 10.8 LV SV Index:   31 LVOT Area:     2.54 cm  RIGHT VENTRICLE RV S prime:     9.25 cm/s TAPSE (M-mode): 2.0 cm LEFT ATRIUM  Index       RIGHT ATRIUM           Index LA Vol (A2C):   86.2 ml 48.00 ml/m RA Area:     23.80 cm LA Vol (A4C):   81.9 ml 45.61 ml/m RA Volume:   75.40 ml  41.99 ml/m LA Biplane Vol: 85.3 ml 47.50 ml/m  AORTIC VALVE LVOT Vmax:   99.20 cm/s LVOT Vmean:  64.300 cm/s LVOT VTI:    0.221 m  AORTA Ao Root diam: 4.90 cm MITRAL VALVE                TRICUSPID VALVE MV Area (PHT): 3.97 cm     TR Peak grad:   30.2 mmHg MV Decel Time: 191 msec     TR Vmax:        275.00 cm/s MV E velocity: 154.00 cm/s                             SHUNTS                             Systemic VTI:  0.22 m                             Systemic Diam: 1.80 cm Dani Gobble Croitoru MD Electronically signed by Sanda Klein MD Signature Date/Time: 01/24/2020/4:45:09 PM    Final    US LIVER DOPPLER  Result Date: 01/27/2020 CLINICAL DATA:  Cirrhosis. EXAM: DUPLEX ULTRASOUND OF LIVER TECHNIQUE: Color and duplex Doppler  ultrasound was performed to evaluate the hepatic in-flow and out-flow vessels. COMPARISON:  CT abdomen 01/24/2020 FINDINGS: Liver: Liver is slightly heterogeneous with a nodular contour. Findings are compatible with cirrhosis. No discrete liver lesion. Small amount of perihepatic ascites. Main Portal Vein size: 1.5 cm Portal Vein Velocities Main Prox:  32 cm/sec Main Mid: 26 cm/sec Main Dist:  25 cm/sec Right: 20 cm/sec Left: 80 cm/sec Hepatic Vein Velocities Right:  49 cm/sec Middle:  70 cm/sec Left:  69 cm/sec IVC: Present and patent with normal respiratory phasicity. Hepatic Artery Velocity:  109 cm/sec Splenic Vein Velocity:  24 cm/sec Spleen: 15.8 cm x 15.7 cm x 7.6 cm with a total volume of 991 cm^3 (411 cm^3 is upper limit normal) Portal Vein Occlusion/Thrombus: No Splenic Vein Occlusion/Thrombus: No Ascites: Present Varices: Present Normal hepatofugal flow in the hepatic veins. Normal hepatopetal flow in the portal veins. Enlarged varices at the umbilicus and this corresponds with the previous CT findings. IMPRESSION: 1. 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 2. Portal venous system is patent with normal direction of flow. Electronically Signed   By: Markus Daft M.D.   On: 01/27/2020 13:52    Orson Eva, DO  [facility]  If 7PM-7AM, please contact night-coverage [email or web address] Password [email or web address] 02/18/2020, 4:27 PM   LOS: 5 days

## 2020-02-19 ENCOUNTER — Telehealth: Payer: Self-pay | Admitting: Cardiovascular Disease

## 2020-02-19 ENCOUNTER — Telehealth (INDEPENDENT_AMBULATORY_CARE_PROVIDER_SITE_OTHER): Payer: Self-pay

## 2020-02-19 DIAGNOSIS — I2510 Atherosclerotic heart disease of native coronary artery without angina pectoris: Secondary | ICD-10-CM

## 2020-02-19 LAB — BRAIN NATRIURETIC PEPTIDE: B Natriuretic Peptide: 281 pg/mL — ABNORMAL HIGH (ref 0.0–100.0)

## 2020-02-19 MED ORDER — POTASSIUM CHLORIDE ER 10 MEQ PO TBCR: 10.0000 meq | EXTENDED_RELEASE_TABLET | Freq: Every day | ORAL | 2 refills | Status: DC

## 2020-02-19 MED ORDER — DOXYCYCLINE HYCLATE 100 MG PO CAPS: 100.0000 mg | ORAL_CAPSULE | Freq: Two times a day (BID) | ORAL | 0 refills | Status: AC

## 2020-02-19 MED ORDER — SODIUM CHLORIDE 0.9 % IV SOLN: 2.0000 g | Freq: Two times a day (BID) | INTRAVENOUS | Status: DC

## 2020-02-19 NOTE — Progress Notes (Signed)
  Pharmacy Antibiotic Note  Samantha [name] is a 69 y.o. female admitted on 02/13/2020 with HCAP pneumonia.   . Patient was started on meropenem yesterday due to her possible allergies to PCN, cefaclor and cephalexin.    Cefepime will be started today,  as there is no side chain similarity to first generation cephalosporins.  Plan:  Discontinue  meropenem Start cefepime 2g IV q12h at 1500 today       Pharmacy will continue to monitor renal function,  cultures and patient progress.  Height: 5' 2"  (157.5 cm) Weight: 86 kg (189 lb 9.5 oz) IBW/kg (Calculated) : 50.1  Temp (24hrs), Avg:98.4 F (36.9 C), Min:97.9 F (36.6 C), Max:99.3 F (37.4 C)  Recent Labs  Lab 02/13/20 1637 02/15/20 0443 02/16/20 0449 02/17/20 0344 02/17/20 1219 02/18/20 0544  WBC 5.8 4.7  --   --  5.3  --   CREATININE 1.04* 1.06* 1.15* 1.15*  --  1.26*    Estimated Creatinine Clearance: 41.7 mL/min (A) (by C-G formula based on SCr of 1.26 mg/dL (H)).    Allergies  Allergen Reactions  . Entresto [Sacubitril-Valsartan] Swelling    Swelling of eyes  . Acetaminophen Other (See Comments)    Liver problems  . Ace Inhibitors Other (See Comments)    UNSURE OF REACTION TYPE   . Cefaclor Itching and Rash    Pt tolerates meropenem  . Cephalexin Itching and Rash    Pt tolerates meropenem  . Penicillins Rash    Has patient had a PCN reaction causing immediate rash, facial/tongue/throat swelling, SOB or lightheadedness with hypotension: YES Has patient had a PCN reaction causing severe rash involving mucus membranes or skin necrosis: NO Has patient had a PCN reaction that required hospitalization: YES Has patient had a PCN reaction occurring within the last 10 years: NO If all of the above answers are "NO", then may proceed with Cephalosporin use. Pt tolerates meropenem 02/19/20   . Pregabalin Other (See Comments)    Retains fluid  . Tape Itching and Rash    Takes skin right off with medical tape--PAPER TAPE  ONLY    Antimicrobials this admission: cefepime 12/22>>   meropenem 12/21 >>    Dose adjustments this admission: meropenem, cefepime  Microbiology results:  12/18 UCx: insig growth  12/16 Resp PCR: SARS CoV-2 negative; Flu A/B negative    Thank you for allowing pharmacy to be a part of this patient's care.  Despina Pole 02/19/2020 10:28 AM

## 2020-02-19 NOTE — Telephone Encounter (Signed)
Daughter of the patient called and was told to speak directly to Dr. Antionette Char Nurse in regards to making an appointment for the patient. Please call

## 2020-02-19 NOTE — Progress Notes (Signed)
PMT consult received and chart reviewed. Visited with patient at bedside. No family present. She tells me she may be getting discharged today and eager to return home before Christmas. She lives with her brother and sister-in-law. Family does not plan to visit today unless to pick her up if discharged. Needs family for [place] discussion.   Discussed with Dr. Wynetta Emery. Hopeful for discharge this afternoon. Recommended outpatient palliative referral so family can be present for [place] discussion. TOC team notified. Thank you.  NO CHARGE  Ihor Dow, [place], FNP-C Palliative Medicine Team  Phone: [phone] Fax: [phone]

## 2020-02-19 NOTE — Discharge Summary (Signed)
Physician Discharge Summary  EMMAJANE ALTAMURA OFH:[id number] DOB: Oct 17, 1950 DOA: 02/13/2020  PCP: [name], MD  Admit date: 02/13/2020 Discharge date: 02/19/2020  Admitted From:  Home  Disposition:  Home with [facility]  Recommendations for Outpatient Follow-up:  1. Follow up with PCP in 1 weeks 2. Follow up with cardiology in 1-2 weeks 3. Follow up with GI as scheduled.  4. Outpatient palliative medicine referral made 5. Please obtain BMP in one week to follow lytes  Home Health:  RN, PT  Discharge Condition: STABLE   CODE STATUS: DNR    Brief Hospitalization Summary: Please see all hospital notes, images, labs for full details of the hospitalization. ADMISSION HPI: ALBERTINA LEISE is a 69 y.o. female with medical history significant for  CABG x3, non alcoholic liver disease, atria fibrillation, HTN.  Patient presented to the ED with complaints of bilateral lower extremity swelling.  This has been an ongoing issue for several weeks to months, with hospitalizations 11/25-11/30 and 12/6 - 12/7.  Patient reports compliance with torsemide, she was switched to Lasix yesterday.  She reports improvement in urine output with Lasix.  But currently patient is unable to ambulate due to significant bilateral lower extremity swelling.  She is having weeping and blisters on her right lower extremities.  She denies abdominal bloating.  She reports she fell onto her back and is reporting lower back pain, did not hit head.  She reports intermittent difficulty breathing.  Stable 1-2 pillow use.  No cough.  No chest pain.  ED Course: Temperature 97.9.  Heart rate 97-117, respiratory rate 14-21, blood pressure systolic 549 826.  O2 sats 92 to 96% on room air.  Potassium low 2.8.  BNP consistent with recent hospitalizations -mildly elevated at 155.  INR 1.6. EDP talked to Dr. Laural Golden, who recommended admission, IV albumin 50 mg daily for 3 to 5 days, with Lasix.  IV Lasix 40 mg  And albumin also given.  Hospital  Course The patient was admitted with acute on chronic systolic and diastolic CHF and was diuresed with IV Lasix.  She is feeling much better she is nearly 4 L diuresed now and insisting to discharge home today.  She also had a sinus pause and she was taken off all AV nodal blocking agents and beta-blockers after consultation with the cardiology service.  She was also treated for pneumonia, with meropenem and is feeling better now and will discharge home on oral doxycycline.  It was strongly recommended that because of care discussions be started with patient has very limited medical options available information at this point.  The patient preferred outpatient palliative consultation and she would like for her family to be present for the goals of care discussions.  Patient unfortunately is not a candidate for anticoagulation due to liver cirrhosis esophageal varices and thrombocytopenia.  She does have coronary artery disease and Imdur has been temporarily held due to soft blood pressures.  She was encouraged to follow-up with her cardiologist outpatient to discuss restarting after her blood pressure is stabilized.  Patient insists on discharging home today and not willing to stay in the hospital another day she will discharge home with home health services.  RN and PT may have been ordered.  Close outpatient follow-up strongly recommended.  Discharge Diagnoses:  Principal Problem:   Volume overload Active Problems:   CAD (coronary artery disease)   Essential hypertension   S/P CABG x 3- 2001   Cirrhosis, non-alcoholic (HCC)   Cardiomyopathy, ischemic-35-40%  by cath    Persistent atrial fibrillation   Chronic combined systolic and diastolic CHF (congestive heart failure) (HCC)   Paroxysmal atrial fibrillation (HCC)   Anasarca   Cirrhosis of liver with ascites (HCC)   Hypokalemia   Hepatic cirrhosis ([place])   Lobar pneumonia [facility])   Discharge Instructions: Discharge Instructions    Amb Referral  to Palliative Care   Complete by: As directed      Allergies as of 02/19/2020      Reactions   Entresto [sacubitril-valsartan] Swelling   Swelling of eyes   Acetaminophen Other (See Comments)   Liver problems   Ace Inhibitors Other (See Comments)   UNSURE OF REACTION TYPE   Cefaclor Itching, Rash   Pt tolerates meropenem   Cephalexin Itching, Rash   Pt tolerates meropenem   Penicillins Rash   Has patient had a PCN reaction causing immediate rash, facial/tongue/throat swelling, SOB or lightheadedness with hypotension: YES Has patient had a PCN reaction causing severe rash involving mucus membranes or skin necrosis: NO Has patient had a PCN reaction that required hospitalization: YES Has patient had a PCN reaction occurring within the last 10 years: NO If all of the above answers are "NO", then may proceed with Cephalosporin use. Pt tolerates meropenem 02/19/20   Pregabalin Other (See Comments)   Retains fluid   Tape Itching, Rash   Takes skin right off with medical tape--PAPER TAPE ONLY      Medication List    STOP taking these medications   cyclobenzaprine 10 MG tablet Commonly known as: FLEXERIL   hydrocortisone 25 MG suppository Commonly known as: ANUSOL-HC   isosorbide mononitrate 60 MG 24 hr tablet Commonly known as: IMDUR   metoprolol succinate 25 MG 24 hr tablet Commonly known as: TOPROL-XL     TAKE these medications   albuterol 108 (90 Base) MCG/ACT inhaler Commonly known as: VENTOLIN HFA Inhale 2 puffs into the lungs every 6 (six) hours as needed for wheezing or shortness of breath.   ALPRAZolam 0.5 MG tablet Commonly known as: XANAX Take 0.5 mg by mouth 3 (three) times daily as needed for sleep.   doxycycline 100 MG capsule Commonly known as: VIBRAMYCIN Take 1 capsule (100 mg total) by mouth 2 (two) times daily for 3 days.   ezetimibe 10 MG tablet Commonly known as: ZETIA Take 10 mg by mouth at bedtime.   FLUoxetine 20 MG capsule Commonly known  as: PROZAC Take 20 mg by mouth daily.   furosemide 40 MG tablet Commonly known as: Lasix Take 1 tablet (40 mg total) by mouth 2 (two) times daily.   ipratropium 0.03 % nasal spray Commonly known as: ATROVENT Place 1 spray into both nostrils daily as needed (allergies).   Metamucil Smooth Texture 58.6 % powder Generic drug: psyllium Take 1 packet by mouth at bedtime.   Nitrostat 0.4 MG SL tablet Generic drug: nitroGLYCERIN Place 0.4 mg under the tongue every 5 (five) minutes as needed for chest pain (x 3 tabs daily).   ondansetron 4 MG disintegrating tablet Commonly known as: Zofran ODT Take 1 tablet (4 mg total) by mouth every 8 (eight) hours as needed for nausea or vomiting.   pantoprazole 40 MG tablet Commonly known as: PROTONIX Take 40 mg by mouth daily.   polyethylene glycol 17 g packet Commonly known as: MIRALAX / GLYCOLAX Take 25.5 g by mouth daily.   potassium chloride 10 MEQ tablet Commonly known as: KLOR-CON Take 1 tablet (10 mEq total) by mouth daily.  Start taking on: February 20, 2020 What changed: when to take this   spironolactone 25 MG tablet Commonly known as: ALDACTONE Take 1 tablet (25 mg total) by mouth daily.       Follow-up Information    [facility] Follow up.   Specialty: Home Health Services Why: Will contact you to schedule home health visits. Contact information: [place] [address] [phone]        [name], MD. Schedule an appointment as soon as possible for a visit in 1 week(s).   Specialty: Family Medicine Contact information: [place] [address] [phone]        Sherren Mocha, MD. Schedule an appointment as soon as possible for a visit in 1 week(s).   Specialty: Cardiology Contact information: [address] [place] [address] [phone]        Bensimhon, Shaune Pascal, MD. Schedule an appointment as soon as possible for a visit in  2 week(s).   Specialty: Cardiology Contact information: [address] [place] [address] [phone]        Rogene Houston, MD. Schedule an appointment as soon as possible for a visit in 2 week(s).   Specialty: Gastroenterology Contact information: [address], [address] [place] [address] [phone]              Allergies  Allergen Reactions  . Entresto [Sacubitril-Valsartan] Swelling    Swelling of eyes  . Acetaminophen Other (See Comments)    Liver problems  . Ace Inhibitors Other (See Comments)    UNSURE OF REACTION TYPE   . Cefaclor Itching and Rash    Pt tolerates meropenem  . Cephalexin Itching and Rash    Pt tolerates meropenem  . Penicillins Rash    Has patient had a PCN reaction causing immediate rash, facial/tongue/throat swelling, SOB or lightheadedness with hypotension: YES Has patient had a PCN reaction causing severe rash involving mucus membranes or skin necrosis: NO Has patient had a PCN reaction that required hospitalization: YES Has patient had a PCN reaction occurring within the last 10 years: NO If all of the above answers are "NO", then may proceed with Cephalosporin use. Pt tolerates meropenem 02/19/20   . Pregabalin Other (See Comments)    Retains fluid  . Tape Itching and Rash    Takes skin right off with medical tape--PAPER TAPE ONLY   Allergies as of 02/19/2020      Reactions   Entresto [sacubitril-valsartan] Swelling   Swelling of eyes   Acetaminophen Other (See Comments)   Liver problems   Ace Inhibitors Other (See Comments)   UNSURE OF REACTION TYPE   Cefaclor Itching, Rash   Pt tolerates meropenem   Cephalexin Itching, Rash   Pt tolerates meropenem   Penicillins Rash   Has patient had a PCN reaction causing immediate rash, facial/tongue/throat swelling, SOB or lightheadedness with hypotension: YES Has patient had a PCN reaction causing severe rash involving mucus membranes or skin necrosis:  NO Has patient had a PCN reaction that required hospitalization: YES Has patient had a PCN reaction occurring within the last 10 years: NO If all of the above answers are "NO", then may proceed with Cephalosporin use. Pt tolerates meropenem 02/19/20   Pregabalin Other (See Comments)   Retains fluid   Tape Itching, Rash   Takes skin right off with medical tape--PAPER TAPE ONLY      Medication  List    STOP taking these medications   cyclobenzaprine 10 MG tablet Commonly known as: FLEXERIL   hydrocortisone 25 MG suppository Commonly known as: ANUSOL-HC   isosorbide mononitrate 60 MG 24 hr tablet Commonly known as: IMDUR   metoprolol succinate 25 MG 24 hr tablet Commonly known as: TOPROL-XL     TAKE these medications   albuterol 108 (90 Base) MCG/ACT inhaler Commonly known as: VENTOLIN HFA Inhale 2 puffs into the lungs every 6 (six) hours as needed for wheezing or shortness of breath.   ALPRAZolam 0.5 MG tablet Commonly known as: XANAX Take 0.5 mg by mouth 3 (three) times daily as needed for sleep.   doxycycline 100 MG capsule Commonly known as: VIBRAMYCIN Take 1 capsule (100 mg total) by mouth 2 (two) times daily for 3 days.   ezetimibe 10 MG tablet Commonly known as: ZETIA Take 10 mg by mouth at bedtime.   FLUoxetine 20 MG capsule Commonly known as: PROZAC Take 20 mg by mouth daily.   furosemide 40 MG tablet Commonly known as: Lasix Take 1 tablet (40 mg total) by mouth 2 (two) times daily.   ipratropium 0.03 % nasal spray Commonly known as: ATROVENT Place 1 spray into both nostrils daily as needed (allergies).   Metamucil Smooth Texture 58.6 % powder Generic drug: psyllium Take 1 packet by mouth at bedtime.   Nitrostat 0.4 MG SL tablet Generic drug: nitroGLYCERIN Place 0.4 mg under the tongue every 5 (five) minutes as needed for chest pain (x 3 tabs daily).   ondansetron 4 MG disintegrating tablet Commonly known as: Zofran ODT Take 1 tablet (4 mg total)  by mouth every 8 (eight) hours as needed for nausea or vomiting.   pantoprazole 40 MG tablet Commonly known as: PROTONIX Take 40 mg by mouth daily.   polyethylene glycol 17 g packet Commonly known as: MIRALAX / GLYCOLAX Take 25.5 g by mouth daily.   potassium chloride 10 MEQ tablet Commonly known as: KLOR-CON Take 1 tablet (10 mEq total) by mouth daily. Start taking on: February 20, 2020 What changed: when to take this   spironolactone 25 MG tablet Commonly known as: ALDACTONE Take 1 tablet (25 mg total) by mouth daily.       Procedures/Studies: CT Lumbar Spine Wo Contrast  Result Date: 02/13/2020 CLINICAL DATA:  Low back pain, trauma, fall into low back. Acute midline tenderness. EXAM: CT LUMBAR SPINE WITHOUT CONTRAST TECHNIQUE: Multidetector CT imaging of the lumbar spine was performed without intravenous contrast administration. Multiplanar CT image reconstructions were also generated. COMPARISON:  Reformats from abdominal CT 01/24/2020, report from lumbar spine MRI 12/12/2017, images not available FINDINGS: Segmentation: 5 lumbar type vertebrae. Alignment: Normal. Vertebrae: Chronic L1 compression fracture, unchanged from prior exam, with vertebral plasty. No interval progression. The remaining vertebral body heights are normal. No acute fracture. Posterior elements are intact. Prior laminectomies at L4 and L5. Paraspinal and other soft tissues: Small amount of cement in the left paraspinal musculature adjacent to L1 from prior vertebroplasty. Otherwise negative. Disc levels: Interbody spacer at L4-L5 which is slightly posteriorly positioned, also described on prior exam. Interbody spacer at L5-S1 appear seated. Disc space narrowing at L1-L2. There is mild multilevel facet hypertrophy. IMPRESSION: 1. No acute fracture or subluxation of the lumbar spine. 2. Chronic L1 compression fracture with vertebral augmentation, unchanged from prior exam. 3. Postsurgical change at L4-L5 and L5-S1  with interbody spacers. L4-L5 interbody cages slightly posteriorly positions, also described on prior MRI and likely chronic. Electronically  Signed   By: Keith Rake M.D.   On: 02/13/2020 17:15   CT ABDOMEN PELVIS W CONTRAST  Result Date: 01/24/2020 CLINICAL DATA:  Abdominal pain and fever. EXAM: CT ABDOMEN AND PELVIS WITH CONTRAST TECHNIQUE: Multidetector CT imaging of the abdomen and pelvis was performed using the standard protocol following bolus administration of intravenous contrast. CONTRAST:  17m OMNIPAQUE IOHEXOL 300 MG/ML  SOLN COMPARISON:  CT angiography of the abdomen pelvis 05/23/2019 FINDINGS: Lower chest: Subsegmental atelectasis versus scar noted within the right middle lobe and both posterior lower lobes.6 mm ground-glass nodule is identified within the left lower lobe. This is new when compared with 05/23/2019. Hepatobiliary: Advanced changes of cirrhosis. The liver has a shrunken and nodular appearance. No focal liver abnormality identified. Gallbladder is unremarkable. Tiny stones within the dependent portion of the gallbladder suspected. No signs of biliary ductal dilatation or acute cholecystitis. Pancreas: Unremarkable. No pancreatic ductal dilatation or surrounding inflammatory changes. Spleen: Enlarged measuring 17.5 by 11.3 x 6.5 cm (volume = 670 cm^3) Adrenals/Urinary Tract: Normal appearance of the adrenal glands. No kidney mass or hydronephrosis identified. Urinary bladder is unremarkable. Stomach/Bowel: Stomach appears nondistended. The appendix is visualized and appears normal. No small bowel wall thickening, inflammation or distension. There is mild wall thickening involving the ascending colon, nonspecific in the setting of cirrhosis, ascites and portal venous hypertension. Sigmoid diverticulosis noted without acute inflammation. Vascular/Lymphatic: Aortic atherosclerosis. No aneurysm. Distal esophageal varices noted. There also large gastric and umbilical varices. No  abdominal no pelvic adenopathy. Varices. Reproductive: Status post hysterectomy. No adnexal masses. Other: New upper abdominal ascites overlying the spleen and liver. No discrete fluid collections identified. Musculoskeletal: Previous right hip arthroplasty. Status post vertebral augmentation at L1. Interbody spacers are noted at L4-5 and L5-S1. IMPRESSION: 1. No acute findings identified within the abdomen or pelvis. 2. Advanced changes of cirrhosis and portal venous hypertension. New upper abdominal ascites. 3. New 6 mm ground-glass nodule identified within the left lower lobe. Likely postinflammatory Initial follow-up with CT at 6-12 months is recommended to confirm persistence. If persistent, repeat CT is recommended every 2 years until 5 years of stability has been established. This recommendation follows the consensus statement: Guidelines for Management of Incidental Pulmonary Nodules Detected on CT Images: From the Fleischner Society 2017; Radiology 2017; 284:228-243. 4. Aortic atherosclerosis. Aortic Atherosclerosis (ICD10-I70.0). Electronically Signed   By: TKerby MoorsM.D.   On: 01/24/2020 14:14   UKoreaVenous Img Lower Bilateral (DVT)  Result Date: 01/24/2020 CLINICAL DATA:  Left leg swelling. EXAM: BILATERAL LOWER EXTREMITY VENOUS DOPPLER ULTRASOUND TECHNIQUE: Gray-scale sonography with graded compression, as well as color Doppler and duplex ultrasound were performed to evaluate the lower extremity deep venous systems from the level of the common femoral vein and including the common femoral, femoral, profunda femoral, popliteal and calf veins including the posterior tibial, peroneal and gastrocnemius veins when visible. The superficial great saphenous vein was also interrogated. Spectral Doppler was utilized to evaluate flow at rest and with distal augmentation maneuvers in the common femoral, femoral and popliteal veins. COMPARISON:  None. FINDINGS: RIGHT LOWER EXTREMITY Common Femoral Vein: No  evidence of thrombus. Normal compressibility, respiratory phasicity and response to augmentation. Saphenofemoral Junction: No evidence of thrombus. Normal compressibility and flow on color Doppler imaging. Profunda Femoral Vein: No evidence of thrombus. Normal compressibility and flow on color Doppler imaging. Femoral Vein: No evidence of thrombus. Normal compressibility, respiratory phasicity and response to augmentation. Popliteal Vein: No evidence of thrombus. Normal compressibility, respiratory phasicity and  response to augmentation. Calf Veins: Visualized right deep calf veins are patent without thrombus. Other Findings:  None. LEFT LOWER EXTREMITY Common Femoral Vein: No evidence of thrombus. Normal compressibility, respiratory phasicity and response to augmentation. Saphenofemoral Junction: No evidence of thrombus. Normal compressibility and flow on color Doppler imaging. Profunda Femoral Vein: No evidence of thrombus. Normal compressibility and flow on color Doppler imaging. Femoral Vein: No evidence of thrombus. Normal compressibility, respiratory phasicity and response to augmentation. Popliteal Vein: No evidence of thrombus. Normal compressibility, respiratory phasicity and response to augmentation. Calf Veins: Visualized left deep calf veins are patent without thrombus. Other Findings:  None. IMPRESSION: No evidence of deep venous thrombosis in either lower extremity. Electronically Signed   By: Markus Daft M.D.   On: 01/24/2020 10:54   DG CHEST PORT 1 VIEW  Result Date: 02/17/2020 CLINICAL DATA:  Shortness of breath EXAM: PORTABLE CHEST 1 VIEW COMPARISON:  02/13/2020 FINDINGS: Post CABG changes. Stable cardiomegaly. Atherosclerotic calcification of the aortic knob. Pulmonary vascular congestion with increasing interstitial and alveolar opacities. Bandlike opacities within the left mid lung. No pleural effusion or pneumothorax. IMPRESSION: 1. Findings suggestive of CHF with pulmonary edema.  Superimposed infectious process would be difficult to exclude. 2. Bandlike opacities within the left mid lung may reflect atelectasis versus pneumonia. Electronically Signed   By: Davina Poke D.O.   On: 02/17/2020 13:40   DG CHEST PORT 1 VIEW  Result Date: 02/13/2020 CLINICAL DATA:  Bilateral leg swelling. EXAM: PORTABLE CHEST 1 VIEW COMPARISON:  02/02/2020 FINDINGS: Post median sternotomy. The lowest most sternal wire is broken, chronic. Cardiomegaly is stable. Vascular congestion. Possible mild pulmonary edema with suspected basilar septal thickening. Multifocal linear atelectasis or scarring. No confluent consolidation. No pleural fluid. No pneumothorax. No acute osseous abnormalities are seen. IMPRESSION: 1. Cardiomegaly with vascular congestion and possible mild pulmonary edema. 2. Multifocal linear atelectasis or scarring. Electronically Signed   By: Keith Rake M.D.   On: 02/13/2020 18:53   DG Chest Portable 1 View  Result Date: 02/02/2020 CLINICAL DATA:  Volume overload, lower extremity edema EXAM: PORTABLE CHEST 1 VIEW COMPARISON:  01/23/2020 FINDINGS: Single frontal view of the chest demonstrates stable enlarged cardiac silhouette. Postsurgical changes are seen from median sternotomy. There is chronic central vascular congestion without airspace disease, effusion, or pneumothorax. IMPRESSION: 1. Central vascular congestion without edema. Electronically Signed   By: Randa Ngo M.D.   On: 02/02/2020 23:09   DG Chest Portable 1 View  Result Date: 01/23/2020 CLINICAL DATA:  Chest pain EXAM: PORTABLE CHEST 1 VIEW COMPARISON:  05/23/2019, 04/17/2018, 08/14/2016 CT 05/17/2019 FINDINGS: Post sternotomy changes. Cardiomegaly with aortic atherosclerosis. Chronic interstitial thickening. No consolidation or effusion. No pneumothorax. IMPRESSION: Cardiomegaly without overt pulmonary edema, pleural effusion or focal airspace disease. Electronically Signed   By: Donavan Foil M.D.   On:  01/23/2020 19:38   ECHOCARDIOGRAM COMPLETE  Result Date: 01/24/2020    ECHOCARDIOGRAM REPORT   Patient Name:   KEAUNDRA STEHLE Date of Exam: 01/24/2020 Medical Rec #:  [id number]      Height:       62.0 in Accession #:    [id number]     Weight:       172.6 lb Date of Birth:  1951/01/28      BSA:          1.796 m Patient Age:    69 years       BP:           107/59 mmHg  Patient Gender: F              HR:           94 bpm. Exam Location:  [facility] Procedure: 2D Echo, Cardiac Doppler and Color Doppler Indications:    Chest Pain 786.50 / R07.9  History:        Patient has prior history of Echocardiogram examinations. CHF,                 CAD, Prior CABG, Arrythmias:Atrial Fibrillation; Risk                 Factors:Hypertension and Dyslipidemia. GERD, Obesity.  Sonographer:    Alvino Chapel RCS Referring Phys: [phone] DAVID TAT IMPRESSIONS  1. Left ventricular ejection fraction, by estimation, is 35 to 40%. The left ventricle has moderately decreased function. The left ventricle demonstrates global hypokinesis. Left ventricular diastolic function could not be evaluated. There is severe hypokinesis of the left ventricular, entire inferior wall and inferolateral wall.  2. Right ventricular systolic function is mildly reduced. The right ventricular size is normal. There is mildly elevated pulmonary artery systolic pressure.  3. Left atrial size was severely dilated.  4. Right atrial size was severely dilated.  5. The mitral valve is normal in structure. Mild to moderate mitral valve regurgitation.  6. Tricuspid valve regurgitation is moderate.  7. The aortic valve is tricuspid. Aortic valve regurgitation is not visualized. Mild aortic valve sclerosis is present, with no evidence of aortic valve stenosis.  8. The inferior vena cava is normal in size with <50% respiratory variability, suggesting right atrial pressure of 8 mmHg. Comparison(s): A prior study was performed on 05/20/2019. The left ventricular function is  worsened. The left ventricular wall motion abnormalities are worse. FINDINGS  Left Ventricle: Left ventricular ejection fraction, by estimation, is 35 to 40%. The left ventricle has moderately decreased function. The left ventricle demonstrates global hypokinesis. Severe hypokinesis of the left ventricular, entire inferior wall and inferolateral wall. The left ventricular internal cavity size was normal in size. There is no left ventricular hypertrophy. Abnormal (paradoxical) septal motion consistent with post-operative status. Left ventricular diastolic function could not be evaluated due to atrial fibrillation. Left ventricular diastolic function could not be evaluated. Right Ventricle: The right ventricular size is normal. No increase in right ventricular wall thickness. Right ventricular systolic function is mildly reduced. There is mildly elevated pulmonary artery systolic pressure. The tricuspid regurgitant velocity  is 2.75 m/s, and with an assumed right atrial pressure of 8 mmHg, the estimated right ventricular systolic pressure is 41.7 mmHg. Left Atrium: Left atrial size was severely dilated. Right Atrium: Right atrial size was severely dilated. Pericardium: There is no evidence of pericardial effusion. Mitral Valve: The mitral valve is normal in structure. Mild mitral annular calcification. Mild to moderate mitral valve regurgitation. Tricuspid Valve: The tricuspid valve is normal in structure. Tricuspid valve regurgitation is moderate. Aortic Valve: The aortic valve is tricuspid. Aortic valve regurgitation is not visualized. Mild aortic valve sclerosis is present, with no evidence of aortic valve stenosis. Pulmonic Valve: The pulmonic valve was normal in structure. Pulmonic valve regurgitation is not visualized. Aorta: The aortic root and ascending aorta are structurally normal, with no evidence of dilitation. Venous: The inferior vena cava is normal in size with less than 50% respiratory variability,  suggesting right atrial pressure of 8 mmHg. IAS/Shunts: No atrial level shunt detected by color flow Doppler.  LEFT VENTRICLE PLAX 2D LVIDd:  5.00 cm  Diastology LV PW:         1.10 cm  LV e' medial:    9.57 cm/s LV IVS:        1.00 cm  LV E/e' medial:  16.1 LVOT diam:     1.80 cm  LV e' lateral:   14.30 cm/s LV SV:         56       LV E/e' lateral: 10.8 LV SV Index:   31 LVOT Area:     2.54 cm  RIGHT VENTRICLE RV S prime:     9.25 cm/s TAPSE (M-mode): 2.0 cm LEFT ATRIUM             Index       RIGHT ATRIUM           Index LA Vol (A2C):   86.2 ml 48.00 ml/m RA Area:     23.80 cm LA Vol (A4C):   81.9 ml 45.61 ml/m RA Volume:   75.40 ml  41.99 ml/m LA Biplane Vol: 85.3 ml 47.50 ml/m  AORTIC VALVE LVOT Vmax:   99.20 cm/s LVOT Vmean:  64.300 cm/s LVOT VTI:    0.221 m  AORTA Ao Root diam: 4.90 cm MITRAL VALVE                TRICUSPID VALVE MV Area (PHT): 3.97 cm     TR Peak grad:   30.2 mmHg MV Decel Time: 191 msec     TR Vmax:        275.00 cm/s MV E velocity: 154.00 cm/s                             SHUNTS                             Systemic VTI:  0.22 m                             Systemic Diam: 1.80 cm Dani Gobble Croitoru MD Electronically signed by Sanda Klein MD Signature Date/Time: 01/24/2020/4:45:09 PM    Final    US LIVER DOPPLER  Result Date: 01/27/2020 CLINICAL DATA:  Cirrhosis. EXAM: DUPLEX ULTRASOUND OF LIVER TECHNIQUE: Color and duplex Doppler ultrasound was performed to evaluate the hepatic in-flow and out-flow vessels. COMPARISON:  CT abdomen 01/24/2020 FINDINGS: Liver: Liver is slightly heterogeneous with a nodular contour. Findings are compatible with cirrhosis. No discrete liver lesion. Small amount of perihepatic ascites. Main Portal Vein size: 1.5 cm Portal Vein Velocities Main Prox:  32 cm/sec Main Mid: 26 cm/sec Main Dist:  25 cm/sec Right: 20 cm/sec Left: 80 cm/sec Hepatic Vein Velocities Right:  49 cm/sec Middle:  70 cm/sec Left:  69 cm/sec IVC: Present and patent with normal  respiratory phasicity. Hepatic Artery Velocity:  109 cm/sec Splenic Vein Velocity:  24 cm/sec Spleen: 15.8 cm x 15.7 cm x 7.6 cm with a total volume of 991 cm^3 (411 cm^3 is upper limit normal) Portal Vein Occlusion/Thrombus: No Splenic Vein Occlusion/Thrombus: No Ascites: Present Varices: Present Normal hepatofugal flow in the hepatic veins. Normal hepatopetal flow in the portal veins. Enlarged varices at the umbilicus and this corresponds with the previous CT findings. IMPRESSION: 1. 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 2. Portal venous system is patent with normal direction of flow. Electronically Signed  By: Markus Daft M.D.   On: 01/27/2020 13:52      Subjective: Pt insists on going home today, says she feels much better, no chest pain, no SOB, no palpitations, no fever or chills.  No cough and no SOB.  She says she will follow up with her outpatient providers as recommended.    Discharge Exam: Vitals:   02/19/20 0156 02/19/20 0409  BP: (!) 109/50 (!) 125/52  Pulse: (!) 104 (!) 104  Resp: 19 18  Temp: 99 F (37.2 C) 98.3 F (36.8 C)  SpO2: 98% 95%   Vitals:   02/18/20 2116 02/18/20 2213 02/19/20 0156 02/19/20 0409  BP: (!) 94/51 (!) 111/52 (!) 109/50 (!) 125/52  Pulse: (!) 108 (!) 107 (!) 104 (!) 104  Resp: 18 18 19 18   Temp: 97.9 F (36.6 C) 99.3 F (37.4 C) 99 F (37.2 C) 98.3 F (36.8 C)  TempSrc:  Oral Oral Oral  SpO2: 95% 92% 98% 95%  Weight:      Height:       General: Pt is chronically ill appearing, alert, awake, not in acute distress Cardiovascular:  Normal S1/S2 sounds, no rubs, no gallops Respiratory: good air movement, no increased work of breathing.  Abdominal: Soft, NT, mild distension, bowel sounds + Extremities: trace LE edema, no cyanosis   The results of significant diagnostics from this hospitalization (including imaging, microbiology, ancillary and laboratory) are listed below for reference.      Microbiology: Recent Results (from the past 240 hour(s))  Resp Panel by RT-PCR (Flu A&B, Covid) Nasopharyngeal Swab     Status: None   Collection Time: 02/13/20  6:00 PM   Specimen: Nasopharyngeal Swab; Nasopharyngeal(NP) swabs in vial transport medium  Result Value Ref Range Status   SARS Coronavirus 2 by RT PCR NEGATIVE NEGATIVE Final    Comment: (NOTE) SARS-CoV-2 target nucleic acids are NOT DETECTED.  The SARS-CoV-2 RNA is generally detectable in upper respiratory specimens during the acute phase of infection. The lowest concentration of SARS-CoV-2 viral copies this assay can detect is 138 copies/mL. A negative result does not preclude SARS-Cov-2 infection and should not be used as the sole basis for treatment or other patient management decisions. A negative result may occur with  improper specimen collection/handling, submission of specimen other than nasopharyngeal swab, presence of viral mutation(s) within the areas targeted by this assay, and inadequate number of viral copies(<138 copies/mL). A negative result must be combined with clinical observations, patient history, and epidemiological information. The expected result is Negative.  Fact Sheet for Patients:  EntrepreneurPulse.com.au  Fact Sheet for Healthcare Providers:  IncredibleEmployment.be  This test is no t yet approved or cleared by the [place] FDA and  has been authorized for detection and/or diagnosis of SARS-CoV-2 by FDA under an Emergency Use Authorization (EUA). This EUA will remain  in effect (meaning this test can be used) for the duration of the COVID-19 declaration under Section 564(b)(1) of the Act, 21 U.S.C.section 360bbb-3(b)(1), unless the authorization is terminated  or revoked sooner.       Influenza A by PCR NEGATIVE NEGATIVE Final   Influenza B by PCR NEGATIVE NEGATIVE Final    Comment: (NOTE) The Xpert Xpress SARS-CoV-2/FLU/RSV plus assay is  intended as an aid in the diagnosis of influenza from Nasopharyngeal swab specimens and should not be used as a sole basis for treatment. Nasal washings and aspirates are unacceptable for Xpert Xpress SARS-CoV-2/FLU/RSV testing.  Fact Sheet for Patients: EntrepreneurPulse.com.au  Fact Sheet  for Healthcare Providers: IncredibleEmployment.be  This test is not yet approved or cleared by the [place] and has been authorized for detection and/or diagnosis of SARS-CoV-2 by FDA under an Emergency Use Authorization (EUA). This EUA will remain in effect (meaning this test can be used) for the duration of the COVID-19 declaration under Section 564(b)(1) of the Act, 21 U.S.C. section 360bbb-3(b)(1), unless the authorization is terminated or revoked.  Performed at [facility], [address], [place], [place] [address]   Culture, Urine     Status: Abnormal   Collection Time: 02/16/20  5:10 PM   Specimen: Urine, Clean Catch  Result Value Ref Range Status   Specimen Description   Final    URINE, CLEAN CATCH Performed at [facility], [address], [place], [place] [address]    Special Requests   Final    NONE Performed at [facility], [address], [place], [place] [address]    Culture (A)  Final    <10,000 COLONIES/mL INSIGNIFICANT GROWTH Performed at [place] [address], [place], [place] [address]    Report Status 02/18/2020 FINAL  Final     Labs: BNP (last 3 results) Recent Labs    02/02/20 2347 02/13/20 1637 02/19/20 0545  BNP 154.0* 155.0* 280.0*   Basic Metabolic Panel: Recent Labs  Lab 02/13/20 1637 02/15/20 0443 02/16/20 0449 02/17/20 0344 02/18/20 0544  NA 137 134* 133* 134* 134*  K 2.8* 4.3 4.5 4.6 4.4  CL 104 104 102 101 100  CO2 24 24 22 22 24   GLUCOSE 63* 104* 148* 100* 100*  BUN 16 17 22 22  24*  CREATININE 1.04* 1.06* 1.15* 1.15* 1.26*  CALCIUM 8.9 8.7* 9.3 9.6 9.3  MG 2.1 1.9  --  2.1 2.2    Liver Function Tests: Recent Labs  Lab 02/13/20 1637 02/15/20 0443 02/17/20 1219  AST 58* 53* 42*  ALT 26 21 18   ALKPHOS 83 69 43  BILITOT 5.2* 4.7* 6.3*  PROT 6.8 6.2* 6.9  ALBUMIN 2.7* 3.0* 4.1   Recent Labs  Lab 02/13/20 1637  LIPASE 44   Recent Labs  Lab 02/17/20 1219 02/18/20 0544  AMMONIA 35 40*   CBC: Recent Labs  Lab 02/13/20 1637 02/15/20 0443 02/17/20 1219  WBC 5.8 4.7 5.3  HGB 12.0 10.3* 9.9*  HCT 36.5 31.8* 30.9*  MCV 97.6 100.6* 101.0*  PLT 49* 41* 45*   Cardiac Enzymes: No results for input(s): CKTOTAL, CKMB, CKMBINDEX, TROPONINI in the last 168 hours. BNP: Invalid input(s): POCBNP CBG: Recent Labs  Lab 02/13/20 2247 02/15/20 2149  GLUCAP 130* 124*   D-Dimer No results for input(s): DDIMER in the last 72 hours. Hgb A1c No results for input(s): HGBA1C in the last 72 hours. Lipid Profile No results for input(s): CHOL, HDL, LDLCALC, TRIG, CHOLHDL, LDLDIRECT in the last 72 hours. Thyroid function studies No results for input(s): TSH, T4TOTAL, T3FREE, THYROIDAB in the last 72 hours.  Invalid input(s): FREET3 Anemia work up No results for input(s): VITAMINB12, FOLATE, FERRITIN, TIBC, IRON, RETICCTPCT in the last 72 hours. Urinalysis    Component Value Date/Time   COLORURINE YELLOW 02/16/2020 1710   APPEARANCEUR CLEAR 02/16/2020 1710   LABSPEC 1.015 02/16/2020 1710   PHURINE 5.0 02/16/2020 1710   GLUCOSEU NEGATIVE 02/16/2020 1710   HGBUR NEGATIVE 02/16/2020 1710   BILIRUBINUR NEGATIVE 02/16/2020 1710   KETONESUR NEGATIVE 02/16/2020 1710   PROTEINUR NEGATIVE 02/16/2020 1710   NITRITE NEGATIVE 02/16/2020 1710   LEUKOCYTESUR NEGATIVE  02/16/2020 1710   Sepsis Labs Invalid input(s): PROCALCITONIN,  WBC,  LACTICIDVEN Microbiology Recent Results (from the past 240 hour(s))  Resp Panel by RT-PCR (Flu A&B, Covid) Nasopharyngeal Swab     Status: None   Collection Time: 02/13/20  6:00 PM   Specimen: Nasopharyngeal Swab;  Nasopharyngeal(NP) swabs in vial transport medium  Result Value Ref Range Status   SARS Coronavirus 2 by RT PCR NEGATIVE NEGATIVE Final    Comment: (NOTE) SARS-CoV-2 target nucleic acids are NOT DETECTED.  The SARS-CoV-2 RNA is generally detectable in upper respiratory specimens during the acute phase of infection. The lowest concentration of SARS-CoV-2 viral copies this assay can detect is 138 copies/mL. A negative result does not preclude SARS-Cov-2 infection and should not be used as the sole basis for treatment or other patient management decisions. A negative result may occur with  improper specimen collection/handling, submission of specimen other than nasopharyngeal swab, presence of viral mutation(s) within the areas targeted by this assay, and inadequate number of viral copies(<138 copies/mL). A negative result must be combined with clinical observations, patient history, and epidemiological information. The expected result is Negative.  Fact Sheet for Patients:  EntrepreneurPulse.com.au  Fact Sheet for Healthcare Providers:  IncredibleEmployment.be  This test is no t yet approved or cleared by the [place] FDA and  has been authorized for detection and/or diagnosis of SARS-CoV-2 by FDA under an Emergency Use Authorization (EUA). This EUA will remain  in effect (meaning this test can be used) for the duration of the COVID-19 declaration under Section 564(b)(1) of the Act, 21 U.S.C.section 360bbb-3(b)(1), unless the authorization is terminated  or revoked sooner.       Influenza A by PCR NEGATIVE NEGATIVE Final   Influenza B by PCR NEGATIVE NEGATIVE Final    Comment: (NOTE) The Xpert Xpress SARS-CoV-2/FLU/RSV plus assay is intended as an aid in the diagnosis of influenza from Nasopharyngeal swab specimens and should not be used as a sole basis for treatment. Nasal washings and aspirates are unacceptable for Xpert Xpress  SARS-CoV-2/FLU/RSV testing.  Fact Sheet for Patients: EntrepreneurPulse.com.au  Fact Sheet for Healthcare Providers: IncredibleEmployment.be  This test is not yet approved or cleared by the [place] FDA and has been authorized for detection and/or diagnosis of SARS-CoV-2 by FDA under an Emergency Use Authorization (EUA). This EUA will remain in effect (meaning this test can be used) for the duration of the COVID-19 declaration under Section 564(b)(1) of the Act, 21 U.S.C. section 360bbb-3(b)(1), unless the authorization is terminated or revoked.  Performed at [facility], [address], [place], [place] [address]   Culture, Urine     Status: Abnormal   Collection Time: 02/16/20  5:10 PM   Specimen: Urine, Clean Catch  Result Value Ref Range Status   Specimen Description   Final    URINE, CLEAN CATCH Performed at [facility], [address], [place], [place] [address]    Special Requests   Final    NONE Performed at [facility], [address], [place], [place] [address]    Culture (A)  Final    <10,000 COLONIES/mL INSIGNIFICANT GROWTH Performed at [place] [address] [place], [place] [address]    Report Status 02/18/2020 FINAL  Final    Time coordinating discharge:  36 mins   SIGNED:  Irwin Brakeman, MD  [facility] 02/19/2020, 12:20 PM How to contact the [facility] Attending or Consulting provider [place] or covering provider during after hours [place],  for this patient?  1. Check the care team in [facility] and look for a) attending/consulting [facility] provider listed and b) the [facility] team listed 2. Log into [email or web address] and use [place] universal password to access. If you do not have the password, please contact the hospital operator. 3. Locate the [facility] provider you are looking for under [facility] and page to a number that you can be directly reached. 4. If you still have difficulty reaching the provider,  please page the [facility] (Director on Call) for the Hospitalists listed on amion for assistance.

## 2020-02-19 NOTE — Plan of Care (Signed)

## 2020-02-19 NOTE — Progress Notes (Signed)
Physical Therapy Treatment Patient Details Name: Samantha [name] MRN: [id number] DOB: 1950-08-10 Today's Date: 02/19/2020    History of Present Illness Samantha [name] is a 69 y.o. female with medical history significant for  CABG x3, non alcoholic liver disease, atria fibrillation, HTN.  Patient presented to the ED with complaints of bilateral lower extremity swelling.  This has been an ongoing issue for several weeks to months, with hospitalizations 11/25-11/30 and 12/6 - 12/7.  Patient reports compliance with torsemide, she was switched to Lasix yesterday.  She reports improvement in urine output with Lasix.  But currently patient is unable to ambulate due to significant bilateral lower extremity swelling.  She is having weeping and blisters on her right lower extremities.  She denies abdominal bloating.  She reports she fell onto her back and is reporting lower back pain, did not hit head.    PT Comments    Patient present in bed with supplemental O2 and agreed to PT and ambulation. Bed mobility with supervision throughout session with HOB elevated. Patient completed sit to stand from EOB with RW then ambulate and stand pivot transfer to toilet to urinate and able to clean self independently.  Ambulate to nurses station and back approx 75 ft with RW. Improved standing balance with RW, but cont demo decreased pace and shuffling steps, gait details below. O2 sat taken after ambulation and at 99% with 2L O2.  Patient left in bed with palliative care in room and nurse notified of position and ambulation. Patient will benefit from continued physical therapy in hospital and recommended venue below to increase strength, balance, endurance for safe ADLs and gait.   Follow Up Recommendations  Home health PT;Supervision for mobility/OOB;Supervision - Intermittent     Equipment Recommendations  None recommended by PT    Recommendations for Other Services       Precautions / Restrictions  Precautions Precautions: Fall Restrictions Weight Bearing Restrictions: No    Mobility  Bed Mobility Overal bed mobility: Modified Independent;Needs Assistance Bed Mobility: Supine to Sit;Sit to Supine     Supine to sit: Min guard Sit to supine: Min guard   General bed mobility comments: HOB elevated  Transfers Overall transfer level: Needs assistance Equipment used: Rolling walker (2 wheeled) Transfers: Sit to/from [facility] Sit to Stand: Min guard;Supervision Stand pivot transfers: Min guard;Min assist       General transfer comment: use B UE to assist with RW  Ambulation/Gait Ambulation/Gait assistance: Min guard;Supervision Gait Distance (Feet): 75 Feet Assistive device: Rolling walker (2 wheeled) Gait Pattern/deviations: Step-through pattern;Decreased step length - left;Decreased step length - right;Shuffle Gait velocity: decreased   General Gait Details: very slow and labored, multiple rest breaks, with RW   Stairs             Wheelchair Mobility    Modified Rankin (Stroke Patients Only)       Balance Overall balance assessment: Needs assistance Sitting-balance support: Feet supported;Single extremity supported Sitting balance-Leahy Scale: Fair Sitting balance - Comments: EOB   Standing balance support: Bilateral upper extremity supported;During functional activity Standing balance-Leahy Scale: Fair Standing balance comment: with RW                            Cognition Arousal/Alertness: Awake/alert Behavior During Therapy: WFL for tasks assessed/performed Overall Cognitive Status: Within Functional Limits for tasks assessed  Exercises      General Comments        Pertinent Vitals/Pain Pain Assessment: Faces Faces Pain Scale: Hurts a little bit Pain Location: headache Pain Descriptors / Indicators: Dull Pain Intervention(s): Patient requesting pain  meds-RN notified    Home Living                      Prior Function            PT Goals (current goals can now be found in the care plan section) Acute Rehab PT Goals Patient Stated Goal: return home PT Goal Formulation: With patient Time For Goal Achievement: 03/02/20 Potential to Achieve Goals: Good Progress towards PT goals: Progressing toward goals    Frequency    Min 3X/week      PT Plan Current plan remains appropriate    Co-evaluation              AM-PAC PT "6 Clicks" Mobility   Outcome Measure  Help needed turning from your back to your side while in a flat bed without using bedrails?: A Lot Help needed moving from lying on your back to sitting on the side of a flat bed without using bedrails?: A Lot Help needed moving to and from a bed to a chair (including a wheelchair)?: A Little Help needed standing up from a chair using your arms (e.g., wheelchair or bedside chair)?: A Little Help needed to walk in hospital room?: A Little Help needed climbing 3-5 steps with a railing? : A Lot 6 Click Score: 15    End of Session Equipment Utilized During Treatment: Oxygen Activity Tolerance: Patient tolerated treatment well;Patient limited by fatigue Patient left: in bed;with call bell/phone within reach Nurse Communication: Mobility status PT Visit Diagnosis: Unsteadiness on feet (R26.81);Other abnormalities of gait and mobility (R26.89);Muscle weakness (generalized) (M62.81)     Time: 7943-2761 PT Time Calculation (min) (ACUTE ONLY): 25 min  Charges:  $Gait Training: 8-22 mins $Therapeutic Activity: 8-22 mins                     12:07 PM,02/19/20 Domenic Moras, PT, DPT Physical Therapist at [facility]

## 2020-02-19 NOTE — Telephone Encounter (Signed)
I spoke with the patient's care giver Samantha [name] she is aware that the patient needs to follow up with Cardiology Dr. Burt Knack and patient can keep the appt with [place] in March unless she has an urgent need. Samantha [name] states the patient already has a follow up scheduled with the cardiologist in January.

## 2020-02-19 NOTE — Telephone Encounter (Signed)
I spoke with Dr. Laural Golden and he states the patient needs to follow up with Cardiology as her problem was with fluid. She can keep her appt on 05/19/2020 at 11 am, unless he develops further GI problems, then need to cal1 the office.  Per Mitzie patient was discharged from the hospital and told she needed an appt here at [place] the first week in January. Per Mitzie we need to discuss with Dr. Dereck Leep as to where to place this appt.

## 2020-02-19 NOTE — Discharge Instructions (Signed)
No driving or operating machinery until cleared by cardiology Please follow up with your cardiologist as soon as able Please have your potassium and kidney function checked in 1 week.   IMPORTANT INFORMATION: PAY CLOSE ATTENTION   PHYSICIAN DISCHARGE INSTRUCTIONS  Follow with Primary care provider  Sasser, Silvestre Moment, MD  and other consultants as instructed by your Hospitalist Physician  [place] IF SYMPTOMS COME BACK, WORSEN OR NEW PROBLEM DEVELOPS   Please note: You were cared for by a hospitalist during your hospital stay. Every effort will be made to forward records to your primary care provider.  You can request that your primary care provider send for your hospital records if they have not received them.  Once you are discharged, your primary care physician will handle any further medical issues. Please note that NO REFILLS for any discharge medications will be authorized once you are discharged, as it is imperative that you return to your primary care physician (or establish a relationship with a primary care physician if you do not have one) for your post hospital discharge needs so that they can reassess your need for medications and monitor your lab values.  Please get a complete blood count and chemistry panel checked by your Primary MD at your next visit, and again as instructed by your Primary MD.  Get Medicines reviewed and adjusted: Please take all your medications with you for your next visit with your Primary MD  Laboratory/radiological data: Please request your Primary MD to go over all hospital tests and procedure/radiological results at the follow up, please ask your primary care provider to get all Hospital records sent to his/her office.  In some cases, they will be blood work, cultures and biopsy results pending at the time of your discharge. Please request that your primary care provider follow up on these results.  If you are  diabetic, please bring your blood sugar readings with you to your follow up appointment with primary care.    Please call and make your follow up appointments as soon as possible.    Also Note the following: If you experience worsening of your admission symptoms, develop shortness of breath, life threatening emergency, suicidal or homicidal thoughts you must seek medical attention immediately by calling 911 or calling your MD immediately  if symptoms less severe.  You must read complete instructions/literature along with all the possible adverse reactions/side effects for all the Medicines you take and that have been prescribed to you. Take any new Medicines after you have completely understood and accpet all the possible adverse reactions/side effects.   Do not drive when taking Pain medications or sleeping medications (Benzodiazepines)  Do not take more than prescribed Pain, Sleep and Anxiety Medications. It is not advisable to combine anxiety,sleep and pain medications without talking with your primary care practitioner  Special Instructions: If you have smoked or chewed Tobacco  in the last 2 yrs please stop smoking, stop any regular Alcohol  and or any Recreational drug use.  Wear Seat belts while driving.  Do not drive if taking any narcotic, mind altering or controlled substances or recreational drugs or alcohol.

## 2020-02-19 NOTE — TOC Transition Note (Signed)
Transition of Care [facility]) - CM/SW Discharge Note   Patient Details  Name: Samantha [name] MRN: [id number] Date of Birth: [birth year]  Transition of Care [facility]) CM/SW Contact:  Salome Arnt, LCSW Phone Number: 02/19/2020, 12:46 PM   Clinical Narrative:  Pt d/c today. Home health PT/RN orders in and [place] notified. LCSW scheduled hospital follow up appointment with PCP. Consulted by palliative for outpatient palliative follow up. LCSW discussed with pt who is agreeable to refer to [facility]. Referral made and they will contact pt. All above information is on AVS. Pt states she has a ride this afternoon. RN updated.     Final next level of care: Home w Home Health Services Barriers to Discharge: Barriers Resolved   Patient Goals and CMS Choice Patient states their goals for this hospitalization and ongoing recovery are:: Return to brothers home   Choice offered to / list presented to : NA  Discharge Placement                  Name of family member notified: pt only- she is contacting family Patient and family notified of of transfer: 02/19/20  Discharge Plan and Services In-house Referral: Clinical Social Work Discharge Planning Services: CM Consult Post Acute Care Choice: NA          DME Arranged: N/A DME Agency: NA       HH Arranged: RN,PT [place] Agency: [place] Date [place]: 02/19/20 Time [place]: 1540 Representative spoke with at [place]: [place] Determinants of Health ([place]) Interventions     Readmission Risk Interventions Readmission Risk Prevention Plan 02/19/2020  PCP or Specialist Appt within 3-5 Days Complete  Some recent data might be hidden

## 2020-02-19 NOTE — Care Management Important Message (Signed)
Important Message  Patient Details  Name: Samantha [name] MRN: [id number] Date of Birth: [birth year]   Medicare Important Message Given:  Yes     Tommy Medal 02/19/2020, 12:24 PM

## 2020-02-20 NOTE — Telephone Encounter (Signed)
Spoke with the patient's brother, who states the patient was told she was terminal. She was told to see Cardiology ASAP from discharge. Scheduled her with Richardson Dopp 12/28. He will call if that appointment needs to be canceled. He was grateful for call.

## 2020-02-21 ENCOUNTER — Telehealth: Payer: Self-pay | Admitting: Nurse Practitioner

## 2020-02-24 NOTE — Progress Notes (Signed)
Cardiology Office Note:    Date:  02/25/2020   ID:  Samantha [name], DOB 1950-07-01, MRN [id number]  PCP:  [name], MD  [facility] Cardiologist:  Sherren Mocha, MD   [facility] Electrophysiologist:  None   Referring MD: [name], MD   Chief Complaint:  Hospitalization Follow-up (S/p multiple admissions over the past 1 month with CHF)    Patient Profile:    Samantha [name] is a 69 y.o. female with:   Coronary artery disease s/p CABG in 2001 ? S/p multiple PCI procedures ? Chronic angina  Non-alcoholic cirrhosis ? Esophageal varices ? Thrombocytopenia (04/2018: PLT 58K)  Heart failure with reduced ejection fraction   EF 25-35 ? Admx in 3/19 with CHF >>followed with Dr. Haroldine Laws until 6/19 in [facility]  ? EF 40-45 in 10/2017 ? admx in 03/2018 with CHF, worsening anemia >> Tx with PRBCs ? Echo 04/2019: EF 50-55 ? Echocardiogram 11/21: EF 35-40   Diabetes mellitus  Atrial Flutter  Persistent AFib ? Not a candidate foranticoagulationdue to cirrhosis/varices  Accelerated idioventricular rhythm ? Beta-blocker DC'd >> restarted in 12/2019 due to reduced EF  SSS  6 sec post termination pause (during admit 12/21) >> beta-blocker DC'd; not felt to be PPM candidate   Hypertension   Hyperlipidemia  COPD, on chronic O2  Peripheral Arterial Disease  ? CT 04/2019: severe distal abdominal aorta and prox bilat CIA stenoses   Prior CV studies: Echocardiogram 01/24/20 EF 35-40, global HK, severe HK of inf and inf-lat wall, mildly reduced RVSF, severe BAE, mild to mod MR, mod TR, mild AV sclerosis (no AS)  Abd/Pelvic CTA 05/23/2019 High grade stenosis dist Abd Aorta prox to bifurcation; high grade stenosis in bilat proximal CIA Cirrhosis with stigmata of portal hypertension  Cardiomegaly Aortic atherosclerosis  Echocardiogram 05/20/2019 EF 50-55, no R WMA, mild LVH, low normal RV SF, RVSP 27.2, moderate LAE, mild RAE, mild MR, mild-moderate  TR  Echo 11/15/2017 EF 40-45, inferior and lateral hypokinesis, grade 2 diastolic dysfunction, mild LAE, trivial TR, PASP 37  Right heart catheterization 05/29/2017 Findings: RA = 11 RV = 51/12 PA = 51/13 (32) PCW = 18 (v = 25-30) Fick cardiac output/index = 9.7/5.0 Thermo CO/CI = 7.6/3.9 PVR = 1.8 WU FA sat = 96% PA sat = 73%, 76% SVC sat = 67% Assessment: 1. Mild to moderately elevated R-sided pressures 2. Minimally elevated wedge pressure with prominent v-waves likely due to diastolic dysfunction 3. High-cardiac output likely due to cirrhotic physiology  4. No evidence of significant intra-cardiac shunting  Echo 05/25/2017 Mild LVH, EF 50-55, MAC, mild MR, moderate LAE, mildly decreased RVSF, moderate RAE, moderate TR, PASP 54  Echo 01/25/17 Mild LVH, EF 30-35, inferior/inferolateral and anterolateral HK, MAC, mild LAE, moderately reduced RVSF, mild RAE, PASP 27  Cardiac catheterization 08/15/16 LAD D1 ostial 65 LCx ostial stent patent with 40 ISR RCA proximal 95 LIMA-OM 2 occluded SVG-AM. patent SVG-D1 patent  History of Present Illness:    Ms. Zywicki was last seen in clinic by Dr. Burt Knack in 9/21.  She was admitted 11/25-11/30 with nausea, vomiting and abdominal pain thought to be due to portal gastropathy.  She was noted to have reduced LVF with EF 35-40.  She was seen by Dr. Domenic Polite with Cardiology.  No further testing was recommended.  She was started back on metoprolol (it had been stopped earlier this year due to AIVR) and spironolactone.  Torsemide was held due to soft BPs and she was  started back on 1/2 dose Torsemide and Spironolactone.  Valsartan was held due to low BP.    She was readmitted 12/5-12/7 with volume overload.  She was diuresed and placed back on her usual dose of Torsemide.    She was readmitted 12/16-12/22 with decompensated congestive heart failure.  She had episodes of AFib/?atypical flutter with a 6 sec post termination pause.  Her  beta-blocker was DC'd.  She is not felt to be a candidate for a pacer.  Her Isosorbide was held due to low BP.  She was covered for pneumonia with antibiotics and was set up for outpatient palliative care consultation.    She returns for f/u. She is here today with her daughter-in-law. She notes continued lower extremity swelling since discharge. This seems to be getting worse. She notes shortness of breath with most activities. She has not had chest discomfort. She sleeps on 2 pillows. Her weight is up about 4 pounds. She has home health nursing and palliative medicine consults pending.  Past Medical History:  Diagnosis Date  . Cataract    OU  . Chronic combined systolic and diastolic CHF (congestive heart failure) ([place])   . Cirrhosis of liver ([place])   . CKD (chronic kidney disease), stage III ([place])   . Coronary artery disease    a. s/p CABG 2001 w/ (LIMA-OM, SVG-D1, SVG-RCA). b. h/o multiple PCIs per Dr. Antionette Char note.  . Depression   . Esophageal varices ([place])    New 2013  . Gastroesophageal reflux disease   . History of pneumonia   . Hyperlipidemia   . Hypertension   . Hypertensive retinopathy    OU  . Obesity   . Osteoarthritis   . Pancytopenia ([place])   . Paroxysmal atrial flutter ([place])    a. dx 05/2016.  [name] Persistent atrial fibrillation ([place])    a. reported in hosp 07/2016, not on anticoag due to cirrhosis and liver disease, low platelets, varices.  . Thrombocytopenia (HCC)     Current Medications: Current Meds  Medication Sig  . albuterol (PROVENTIL HFA;VENTOLIN HFA) 108 (90 BASE) MCG/ACT inhaler Inhale 2 puffs into the lungs every 6 (six) hours as needed for wheezing or shortness of breath.   . ALPRAZolam (XANAX) 0.5 MG tablet Take 0.5 mg by mouth 3 (three) times daily as needed for sleep.  [name] ezetimibe (ZETIA) 10 MG tablet Take 10 mg by mouth at bedtime.  [name] FLUoxetine (PROZAC) 20 MG capsule Take 20 mg by mouth daily.   [name] ipratropium (ATROVENT) 0.03 % nasal spray Place 1  spray into both nostrils daily as needed (allergies).   [name] NITROSTAT 0.4 MG SL tablet Place 0.4 mg under the tongue every 5 (five) minutes as needed for chest pain (x 3 tabs daily).   . nystatin (MYCOSTATIN) 100000 UNIT/ML suspension Take 5 mLs (500,000 Units total) by mouth 4 (four) times daily for 7 days.  . ondansetron (ZOFRAN ODT) 4 MG disintegrating tablet Take 1 tablet (4 mg total) by mouth every 8 (eight) hours as needed for nausea or vomiting.  . pantoprazole (PROTONIX) 40 MG tablet Take 40 mg by mouth daily.  . polyethylene glycol (MIRALAX / GLYCOLAX) 17 g packet Take 25.5 g by mouth daily.  . psyllium (METAMUCIL SMOOTH TEXTURE) 58.6 % powder Take 1 packet by mouth at bedtime. (Patient taking differently: Take 1 packet by mouth daily as needed (for constipation).)  . spironolactone (ALDACTONE) 25 MG tablet Take 25 mg by mouth daily.  [name] torsemide (DEMADEX) 20 MG  tablet Take 2 tablets by mouth in the morning and 1 tablet by mouth in the evening for 3 days, then take 2 tablets by mouth once a day  . [DISCONTINUED] furosemide (LASIX) 40 MG tablet Take 1 tablet (40 mg total) by mouth 2 (two) times daily.  . [DISCONTINUED] potassium chloride (KLOR-CON) 10 MEQ tablet Take 1 tablet (10 mEq total) by mouth daily.     Allergies:   Entresto [sacubitril-valsartan], Vibra-tab [doxycycline], Acetaminophen, Ace inhibitors, Cefaclor, Cephalexin, Penicillins, Pregabalin, and Tape   Social History   Tobacco Use  . Smoking status: Former Smoker    Packs/day: 0.50    Years: 20.00    Pack years: 10.00    Types: Cigarettes    Quit date: 07/21/1995    Years since quitting: 24.6  . Smokeless tobacco: Never Used  Vaping Use  . Vaping Use: Never used  Substance Use Topics  . Alcohol use: No  . Drug use: No     Family Hx: The patient's family history includes Coronary artery disease in her sister; Diabetes in her brother, father, mother, and sister; Glaucoma in her sister; Heart attack in her mother;  Heart attack (age of onset: 38) in her father. There is no history of Colon cancer.  ROS   EKGs/Labs/Other Test Reviewed:    EKG:  EKG is   ordered today.  The ekg ordered today demonstrates probable multifocal ectopic atrial rhythm, HR 99, LBBB, QTC 559  Recent Labs: 02/17/2020: ALT 18; Hemoglobin 9.9; Platelets 45 02/18/2020: BUN 24; Creatinine, Ser 1.26; Magnesium 2.2; Potassium 4.4; Sodium 134 02/19/2020: B Natriuretic Peptide 281.0   Recent Lipid Panel Lab Results  Component Value Date/Time   CHOL 111 05/26/2017 06:37 AM   TRIG 93 05/26/2017 06:37 AM   HDL 38 (L) 05/26/2017 06:37 AM   CHOLHDL 2.9 05/26/2017 06:37 AM   LDLCALC 54 05/26/2017 06:37 AM      Risk Assessment/Calculations:     CHA2DS2-VASc Score = 6  This indicates a 9.7% annual risk of stroke. The patient's score is based upon: CHF History: Yes HTN History: Yes Diabetes History: Yes Stroke History: No Vascular Disease History: Yes Age Score: 1 Gender Score: 1     Physical Exam:    VS:  BP 122/60   Ht _0  (1.575 m)   Wt 179 lb 3.2 oz (81.3 kg)   LMP 03/29/2011   SpO2 92%   BMI 32.78 kg/m     Wt Readings from Last 3 Encounters:  02/25/20 179 lb 3.2 oz (81.3 kg)  02/18/20 189 lb 9.5 oz (86 kg)  02/11/20 183 lb 4.8 oz (83.1 kg)     Constitutional:      Appearance: Healthy appearance. Not in distress.  HENT:   Mouth/Throat:     Mouth: Oral lesions (white plaque on hard palate under denture) present.  Neck:     Vascular: JVR present.  Pulmonary:     Effort: Pulmonary effort is normal.     Breath sounds: No wheezing. Bibasilar Rales (faint) present.  Cardiovascular:     Normal rate. Irregularly irregular rhythm. Normal S1. Normal S2.     Murmurs: There is no murmur.  Edema:    Pretibial: bilateral 2+ edema of the pretibial area.    Ankle: bilateral 2+ edema of the ankle. Abdominal:     Palpations: Abdomen is soft.  Skin:    General: Skin is warm and dry.     Coloration: Skin is  jaundiced.  Neurological:  Mental Status: Alert and oriented to person, place and time.     Cranial Nerves: Cranial nerves are intact.      ASSESSMENT & PLAN:    1. Acute on chronic HFrEF (heart failure with reduced ejection fraction) ([place]) She previously had an ejection fraction of about 30%. She was followed for period of time in the advanced heart failure clinic with Dr. Haroldine Laws with improved symptoms and improved ejection fraction.  Her echocardiogram in March 2021 demonstrated an EF of 50-55%. During an admission in November for possible portal gastropathy, she was noted to have an EF of 35-40%. Her CHF medications were adjusted but this was limited by hypotension. She required lower doses of her diuretics and was readmitted twice with volume excess. Since her last hospitalization, she has gained several pounds at home. Overall, when compared to historical weights, she is still 10 pounds over her dry weight. She is now on furosemide. She was previously on torsemide 40 mg daily. I have recommended that we switch her back to torsemide. She is on low-dose spironolactone as well as potassium. In follow-up, if her blood pressure will allow, we can try to further titrate her CHF medications. Given her multiple, serious, comorbid illnesses, I think it is good that she has considered palliative medicine. Her daughter-in-law tells me that they are to have a phone call with someone with palliative medicine in the next 1 to 2 days.  -DC furosemide  -Start torsemide 40 mg in the a.m., 20 mg in the p.m. x3 days, then decrease to 40 mg daily  -Increase K+ to 20 mEq twice daily x3 days, then reduce to 20 mEq daily  -BMET today and repeat in 1 week  -Close follow-up with me in 1 week  2. Coronary artery disease involving native coronary artery of native heart with angina pectoris [facility]) She has a history of CABG in 2001 and multiple PCI procedures since that time. She is not a candidate for invasive  evaluation such as cardiac catheterization given her cirrhosis with esophageal varices and high bleeding risk. She is not on antiplatelet therapy due to thrombocytopenia. Currently, she is overall stable without significant angina. We will try to resume isosorbide at next visit if her blood pressure will allow. Continue ezetimibe.  3. Persistent atrial fibrillation ([place]) 4. SSS (sick sinus syndrome) ([place]) She is not on anticoagulation due to high bleeding risk. She did have an episode of atrial fibrillation in the hospital. She had been placed back on metoprolol succinate given her reduced ejection fraction prior to that admission. However, she had a 6-second posttermination pause and her beta-blocker was discontinued again. Today, it appears that she likely is in a multifocal atrial/ectopic rhythm. She has P waves with different morphologies. She does note rapid palpitations at times. Given her posttermination pause and poor candidacy for permanent pacemaker, options are very limited to manage her arrhythmia and symptoms.  5. Essential hypertension As noted her blood pressure has limited her medications for CHF.  6. Cirrhosis, non-alcoholic ([place]) Today the first time she has looked jaundiced to me. It sounds as though her hepatic disease is worsening. As noted, she is considering palliative medicine. We had a long discussion about the benefits of this.  7. Oral thrush She notes significant pain in her mouth since she was recently admitted. She does have some white plaques especially on the hard palate underneath her upper dentures. No adjustment is needed for nystatin swish and swallow in patients with advanced hepatic disease.  I will give her a prescription for nystatin 500,000 units 4 times a day swish and swallow for 7 days.       Dispo:  Return in about 1 week (around 03/03/2020) for Close Follow Up, w/ Richardson Dopp, PA-C, in person.   Medication Adjustments/Labs and Tests Ordered: Current  medicines are reviewed at length with the patient today.  Concerns regarding medicines are outlined above.  Tests Ordered: Orders Placed This Encounter  Procedures  . Basic metabolic panel  . EKG 12-Lead   Medication Changes: Meds ordered this encounter  Medications  . nystatin (MYCOSTATIN) 100000 UNIT/ML suspension    Sig: Take 5 mLs (500,000 Units total) by mouth 4 (four) times daily for 7 days.    Dispense:  60 mL    Refill:  0  . torsemide (DEMADEX) 20 MG tablet    Sig: Take 2 tablets by mouth in the morning and 1 tablet by mouth in the evening for 3 days, then take 2 tablets by mouth once a day    Dispense:  65 tablet    Refill:  6  . potassium chloride (KLOR-CON) 10 MEQ tablet    Sig: Take 2 tablets by mouth twice a day for 3 days, then take 2 tablets by mouth once a day    Dispense:  70 tablet    Refill:  6    Signed, Richardson Dopp, PA-C  02/25/2020 9:29 PM    [facility] [place], [place], [place]  [address] Phone: [phone]; Fax: [phone]

## 2020-02-25 ENCOUNTER — Ambulatory Visit: Payer: Medicare Other | Admitting: Physician Assistant

## 2020-02-25 ENCOUNTER — Encounter: Payer: Self-pay | Admitting: Physician Assistant

## 2020-02-25 ENCOUNTER — Other Ambulatory Visit: Payer: Self-pay

## 2020-02-25 VITALS — BP 122/60 | Ht 62.0 in | Wt 179.2 lb

## 2020-02-25 DIAGNOSIS — B370 Candidal stomatitis: Secondary | ICD-10-CM

## 2020-02-25 DIAGNOSIS — I10 Essential (primary) hypertension: Secondary | ICD-10-CM | POA: Diagnosis not present

## 2020-02-25 DIAGNOSIS — I4819 Other persistent atrial fibrillation: Secondary | ICD-10-CM

## 2020-02-25 DIAGNOSIS — I5020 Unspecified systolic (congestive) heart failure: Secondary | ICD-10-CM | POA: Diagnosis not present

## 2020-02-25 DIAGNOSIS — I5023 Acute on chronic systolic (congestive) heart failure: Secondary | ICD-10-CM | POA: Diagnosis not present

## 2020-02-25 DIAGNOSIS — K7460 Unspecified cirrhosis of liver: Secondary | ICD-10-CM

## 2020-02-25 DIAGNOSIS — I495 Sick sinus syndrome: Secondary | ICD-10-CM | POA: Diagnosis not present

## 2020-02-25 DIAGNOSIS — I25119 Atherosclerotic heart disease of native coronary artery with unspecified angina pectoris: Secondary | ICD-10-CM | POA: Diagnosis not present

## 2020-02-25 MED ORDER — TORSEMIDE 20 MG PO TABS: ORAL_TABLET | ORAL | 6 refills | Status: DC

## 2020-02-25 MED ORDER — POTASSIUM CHLORIDE ER 10 MEQ PO TBCR: EXTENDED_RELEASE_TABLET | ORAL | 6 refills | Status: DC

## 2020-02-25 MED ORDER — NYSTATIN 100000 UNIT/ML MT SUSP: 5.0000 mL | Freq: Four times a day (QID) | OROMUCOSAL | 0 refills | Status: DC

## 2020-02-25 NOTE — Patient Instructions (Signed)
Medication Instructions:  Your physician has recommended you make the following change in your medication:   1) Stop Lasix 2) Change Torsemide to 40 mg (2 tablets) in the AM and 1 tablet in the PM for 3 days, then take 2 tablets by mouth once a day 3) Change Potassium to 2 tablets twice a day for 3 days, then take 2 tablets by mouth once a day  *If you need a refill on your cardiac medications before your next appointment, please call your pharmacy*  Lab Work: You will have labs drawn today: BMET  Testing/Procedures: None ordered today  Follow-Up: At [facility], you and your health needs are our priority.  As part of our continuing mission to provide you with exceptional heart care, we have created designated Provider Care Teams.  These Care Teams include your primary Cardiologist (physician) and Advanced Practice Providers (APPs -  Physician Assistants and Nurse Practitioners) who all work together to provide you with the care you need, when you need it.  Your next appointment:   1 week(s) on 03/02/20 at 4:15PM  The format for your next appointment:   In Person  Provider:   Richardson Dopp, PA-C

## 2020-02-26 ENCOUNTER — Telehealth: Payer: Self-pay | Admitting: Nurse Practitioner

## 2020-02-26 ENCOUNTER — Other Ambulatory Visit: Payer: Self-pay

## 2020-02-26 LAB — BASIC METABOLIC PANEL
BUN/Creatinine Ratio: 9 — ABNORMAL LOW (ref 12–28)
BUN: 8 mg/dL (ref 8–27)
CO2: 24 mmol/L (ref 20–29)
Calcium: 9.5 mg/dL (ref 8.7–10.3)
Chloride: 99 mmol/L (ref 96–106)
Creatinine, Ser: 0.91 mg/dL (ref 0.57–1.00)
GFR calc Af Amer: 73 mL/min/{1.73_m2} (ref >59)
GFR calc non Af Amer: 64 mL/min/{1.73_m2} (ref >59)
Glucose: 89 mg/dL (ref 65–99)
Potassium: 4.4 mmol/L (ref 3.5–5.2)
Sodium: 137 mmol/L (ref 134–144)

## 2020-02-26 MED ORDER — NYSTATIN 100000 UNIT/ML MT SUSP: 5.0000 mL | Freq: Four times a day (QID) | OROMUCOSAL | 0 refills | Status: AC

## 2020-02-26 NOTE — Telephone Encounter (Signed)
Called sister-in-law back to schedule the Palliative Consult, her husband said she wasn't home at he would have her call me back when she got back.

## 2020-02-26 NOTE — Telephone Encounter (Signed)
Rec'd message from patient's sister-in-law, Meyer Russel, and wanted to cancel the Palliative Consult she said that [place] is coming out to see patient on 03/02/20.  We will cancel the referral and notify referring MD

## 2020-02-26 NOTE — Telephone Encounter (Signed)
Spoke with patient's sister-in-law Meyer Russel, regarding the Palliative referral/services.  After discussing this she was in agreement with scheduling visit.  Patient is now living with brother and sister-in-law and I told her that I would need to get with the NP to see where we could work her in and then I would call her back to reschedule and she was in agreement with this.

## 2020-02-27 DIAGNOSIS — D696 Thrombocytopenia, unspecified: Secondary | ICD-10-CM | POA: Diagnosis not present

## 2020-02-27 DIAGNOSIS — E7220 Disorder of urea cycle metabolism, unspecified: Secondary | ICD-10-CM | POA: Diagnosis not present

## 2020-02-28 ENCOUNTER — Telehealth: Payer: Self-pay | Admitting: Cardiology

## 2020-02-28 DIAGNOSIS — N183 Chronic kidney disease, stage 3 unspecified: Secondary | ICD-10-CM | POA: Diagnosis not present

## 2020-02-28 DIAGNOSIS — I5023 Acute on chronic systolic (congestive) heart failure: Secondary | ICD-10-CM | POA: Diagnosis not present

## 2020-02-28 DIAGNOSIS — E1122 Type 2 diabetes mellitus with diabetic chronic kidney disease: Secondary | ICD-10-CM | POA: Diagnosis not present

## 2020-02-28 DIAGNOSIS — I130 Hypertensive heart and chronic kidney disease with heart failure and stage 1 through stage 4 chronic kidney disease, or unspecified chronic kidney disease: Secondary | ICD-10-CM | POA: Diagnosis not present

## 2020-02-28 NOTE — Telephone Encounter (Signed)
The patient's sister-in-law called.  They had seen Scott earlier this week.  He increased her torsemide to 40 mg in am and 20 mg in the evening for 3 days with plans to cut back to 40 mg a day.  The patient's sister-in-law who is with her says that she has lost some weight but that she still has significant edema in her legs and that may be worse today.  I suggested they stay on the 40 mg in the morning and 20 mg in the evening as well as K-dur 20 mq BID till they see Scott on Monday.  Kerin Ransom PA-C 02/28/2020 1:33 PM

## 2020-03-01 NOTE — Progress Notes (Deleted)
Cardiology Office Note:    Date:  03/01/2020   ID:  Samantha, [name] 04-20-1950, MRN [id number]  PCP:  [name], MD  [place] Cardiologist:  Sherren Mocha, MD   [place] Electrophysiologist:  None   Referring MD: [name], MD   Chief Complaint:***  No chief complaint on file.    Patient Profile:    Samantha [name] is a 70 y.o. female with:   Coronary artery disease s/p CABG in 2001 ? S/p multiple PCI procedures ? Chronic angina  Non-alcoholic cirrhosis ? Esophageal varices ? Thrombocytopenia (04/2018: PLT 58K)  Heart failure with reduced ejection fraction   EF 25-35 ? Admx in 3/19 with CHF >>followed with Dr. Haroldine Laws until 6/19 in [facility]  ? EF 40-45 in 10/2017 ? admx in 03/2018 with CHF, worsening anemia >> Tx with PRBCs ? Echo 04/2019: EF 50-55 ? Echocardiogram 11/21: EF 35-40   Diabetes mellitus  Atrial Flutter  Persistent AFib ? Not a candidate foranticoagulationdue to cirrhosis/varices  Accelerated idioventricular rhythm ? Beta-blocker DC'd >> restarted in 12/2019 due to reduced EF  SSS  6 sec post termination pause (during admit 12/21) >> beta-blocker DC'd; not felt to be PPM candidate   Hypertension   Hyperlipidemia  COPD, on chronic O2  Peripheral Arterial Disease  ? CT 04/2019: severe distal abdominal aorta and prox bilat CIA stenoses   Prior CV studies: Echocardiogram 01/24/20 EF 35-40, global HK, severe HK of inf and inf-lat wall, mildly reduced RVSF, severe BAE, mild to mod MR, mod TR, mild AV sclerosis (no AS)  Abd/Pelvic CTA 05/23/2019 High grade stenosis dist Abd Aorta prox to bifurcation; high grade stenosis in bilat proximal CIA Cirrhosis with stigmata of portal hypertension  Cardiomegaly Aortic atherosclerosis  Echocardiogram 05/20/2019 EF 50-55, no R WMA, mild LVH, low normal RV SF, RVSP 27.2, moderate LAE, mild RAE, mild MR, mild-moderate TR  Echo 11/15/2017 EF 40-45, inferior and lateral  hypokinesis, grade 2 diastolic dysfunction, mild LAE, trivial TR, PASP 37  Right heart catheterization 05/29/2017 Findings: RA = 11 RV = 51/12 PA = 51/13 (32) PCW = 18 (v = 25-30) Fick cardiac output/index = 9.7/5.0 Thermo CO/CI = 7.6/3.9 PVR = 1.8 WU FA sat = 96% PA sat = 73%, 76% SVC sat = 67% Assessment: 1. Mild to moderately elevated R-sided pressures 2. Minimally elevated wedge pressure with prominent v-waves likely due to diastolic dysfunction 3. High-cardiac output likely due to cirrhotic physiology  4. No evidence of significant intra-cardiac shunting  Echo 05/25/2017 Mild LVH, EF 50-55, MAC, mild MR, moderate LAE, mildly decreased RVSF, moderate RAE, moderate TR, PASP 54  Echo 01/25/17 Mild LVH, EF 30-35, inferior/inferolateral and anterolateral HK, MAC, mild LAE, moderately reduced RVSF, mild RAE, PASP 27  Cardiac catheterization 08/15/16 LAD D1 ostial 65 LCx ostial stent patent with 40 ISR RCA proximal 95 LIMA-OM 2 occluded SVG-AM. patent SVG-D1 patent  History of Present Illness:    Samantha [name] was last seen in clinic 02/25/20 for post hospitalization f/u.  She had been admitted several times.  An echocardiogram in 12/2019 demonstrated worsening LVF again.  She was placed back on beta-blocker Rx.  She had 2 admissions in Dec 2021 with a/c CHF.  She had a post termination pause (6 sec) and her beta-blocker was stopped again.  She was off a lot of CHF meds due to low BP in the hospital.  When I saw her, she was volume overloaded.  I changed her Furosemide to  Torsemide.  She returns for close f/u.  ***   Past Medical History:  Diagnosis Date  . Cataract    OU  . Chronic combined systolic and diastolic CHF (congestive heart failure) ([place])   . Cirrhosis of liver ([place])   . CKD (chronic kidney disease), stage III ([place])   . Coronary artery disease    a. s/p CABG 2001 w/ (LIMA-OM, SVG-D1, SVG-RCA). b. h/o multiple PCIs per Dr. Antionette Char note.  . Depression   .  Esophageal varices ([place])    New 2013  . Gastroesophageal reflux disease   . History of pneumonia   . Hyperlipidemia   . Hypertension   . Hypertensive retinopathy    OU  . Obesity   . Osteoarthritis   . Pancytopenia ([place])   . Paroxysmal atrial flutter ([place])    a. dx 05/2016.  [name] Persistent atrial fibrillation ([place])    a. reported in hosp 07/2016, not on anticoag due to cirrhosis and liver disease, low platelets, varices.  . Thrombocytopenia ([place])     Current Medications: No outpatient medications have been marked as taking for the 03/02/20 encounter (Appointment) with Richardson Dopp T, PA-C.     Allergies:   Entresto [sacubitril-valsartan], Vibra-tab [doxycycline], Acetaminophen, Ace inhibitors, Cefaclor, Cephalexin, Penicillins, Pregabalin, and Tape   Social History   Tobacco Use  . Smoking status: Former Smoker    Packs/day: 0.50    Years: 20.00    Pack years: 10.00    Types: Cigarettes    Quit date: 07/21/1995    Years since quitting: 24.6  . Smokeless tobacco: Never Used  Vaping Use  . Vaping Use: Never used  Substance Use Topics  . Alcohol use: No  . Drug use: No     Family Hx: The patient's family history includes Coronary artery disease in her sister; Diabetes in her brother, father, mother, and sister; Glaucoma in her sister; Heart attack in her mother; Heart attack (age of onset: 101) in her father. There is no history of Colon cancer.  ROS ***  EKGs/Labs/Other Test Reviewed:    EKG:  EKG is ***  ordered today.  The ekg ordered today demonstrates ***  Recent Labs: 02/17/2020: ALT 18; Hemoglobin 9.9; Platelets 45 02/18/2020: Magnesium 2.2 02/19/2020: B Natriuretic Peptide 281.0 02/25/2020: BUN 8; Creatinine, Ser 0.91; Potassium 4.4; Sodium 137   Recent Lipid Panel Lab Results  Component Value Date/Time   CHOL 111 05/26/2017 06:37 AM   TRIG 93 05/26/2017 06:37 AM   HDL 38 (L) 05/26/2017 06:37 AM   CHOLHDL 2.9 05/26/2017 06:37 AM   LDLCALC 54 05/26/2017  06:37 AM      Risk Assessment/Calculations:     CHA2DS2-VASc Score = 6  This indicates a 9.7% annual risk of stroke. The patient's score is based upon: CHF History: Yes HTN History: Yes Diabetes History: Yes Stroke History: No Vascular Disease History: Yes Age Score: 1 Gender Score: 1   {FYI    This patient has a significant risk of stroke if diagnosed with atrial fibrillation.  Please consider VKA or DOAC agent for anticoagulation if the bleeding risk is acceptable.  You can also use the SmartPhrase .[place] for documentation in the A&P of your note.   :[id number]}  Physical Exam:    VS:  LMP 03/29/2011     Wt Readings from Last 3 Encounters:  02/25/20 179 lb 3.2 oz (81.3 kg)  02/18/20 189 lb 9.5 oz (86 kg)  02/11/20 183 lb 4.8 oz (83.1 kg)  Physical Exam ***  ASSESSMENT & PLAN:    ***  1. Acute on chronic HFrEF (heart failure with reduced ejection fraction) ([place]) She previously had an ejection fraction of about 30%. She was followed for period of time in the advanced heart failure clinic with Dr. Haroldine Laws with improved symptoms and improved ejection fraction.  Her echocardiogram in March 2021 demonstrated an EF of 50-55%. During an admission in November for possible portal gastropathy, she was noted to have an EF of 35-40%. Her CHF medications were adjusted but this was limited by hypotension. She required lower doses of her diuretics and was readmitted twice with volume excess. Since her last hospitalization, she has gained several pounds at home. Overall, when compared to historical weights, she is still 10 pounds over her dry weight. She is now on furosemide. She was previously on torsemide 40 mg daily. I have recommended that we switch her back to torsemide. She is on low-dose spironolactone as well as potassium. In follow-up, if her blood pressure will allow, we can try to further titrate her CHF medications. Given her multiple, serious, comorbid illnesses, I think  it is good that she has considered palliative medicine. Her daughter-in-law tells me that they are to have a phone call with someone with palliative medicine in the next 1 to 2 days.  -DC furosemide  -Start torsemide 40 mg in the a.m., 20 mg in the p.m. x3 days, then decrease to 40 mg daily  -Increase K+ to 20 mEq twice daily x3 days, then reduce to 20 mEq daily  -BMET today and repeat in 1 week  -Close follow-up with me in 1 week  2. Coronary artery disease involving native coronary artery of native heart with angina pectoris [facility]) She has a history of CABG in 2001 and multiple PCI procedures since that time. She is not a candidate for invasive evaluation such as cardiac catheterization given her cirrhosis with esophageal varices and high bleeding risk. She is not on antiplatelet therapy due to thrombocytopenia. Currently, she is overall stable without significant angina. We will try to resume isosorbide at next visit if her blood pressure will allow. Continue ezetimibe.  3. Persistent atrial fibrillation ([place]) 4. SSS (sick sinus syndrome) ([place]) She is not on anticoagulation due to high bleeding risk. She did have an episode of atrial fibrillation in the hospital. She had been placed back on metoprolol succinate given her reduced ejection fraction prior to that admission. However, she had a 6-second posttermination pause and her beta-blocker was discontinued again. Today, it appears that she likely is in a multifocal atrial/ectopic rhythm. She has P waves with different morphologies. She does note rapid palpitations at times. Given her posttermination pause and poor candidacy for permanent pacemaker, options are very limited to manage her arrhythmia and symptoms.  5. Essential hypertension As noted her blood pressure has limited her medications for CHF.  6. Cirrhosis, non-alcoholic ([place]) Today the first time she has looked jaundiced to me. It sounds as though her hepatic disease is worsening. As  noted, she is considering palliative medicine. We had a long discussion about the benefits of this.  7. Oral thrush She notes significant pain in her mouth since she was recently admitted. She does have some white plaques especially on the hard palate underneath her upper dentures. No adjustment is needed for nystatin swish and swallow in patients with advanced hepatic disease. I will give her a prescription for nystatin 500,000 units 4 times a day swish and swallow for  7 days. {Are you ordering a CV Procedure (e.g. stress test, cath, DCCV, TEE, etc)?   Press F2        :[id number]}      Dispo:  No follow-ups on file.   Medication Adjustments/Labs and Tests Ordered: Current medicines are reviewed at length with the patient today.  Concerns regarding medicines are outlined above.  Tests Ordered: No orders of the defined types were placed in this encounter.  Medication Changes: No orders of the defined types were placed in this encounter.   Signed, Richardson Dopp, PA-C  03/01/2020 10:44 PM    [facility] [place], [place],   [address] Phone: [phone]; Fax: [phone]

## 2020-03-02 ENCOUNTER — Ambulatory Visit: Payer: Medicare Other | Admitting: Physician Assistant

## 2020-03-02 DIAGNOSIS — I5023 Acute on chronic systolic (congestive) heart failure: Secondary | ICD-10-CM

## 2020-03-02 NOTE — Progress Notes (Signed)
Cardiology Office Note:    Date:  03/03/2020   ID:  Samantha [name], DOB [birth year], MRN [id number]  PCP:  [name], MD  [facility] Cardiologist:  Sherren Mocha, MD   [facility] Electrophysiologist:  None   Referring MD: [name], MD   Chief Complaint:   Follow-up (CHF)    Patient Profile:    Samantha [name] is a 70 y.o. female with:   Coronary artery disease s/p CABG in 2001 ? S/p multiple PCI procedures ? Chronic angina  Non-alcoholic cirrhosis ? Esophageal varices ? Thrombocytopenia (04/2018: PLT 58K)  Heart failure with reduced ejection fraction   EF 25-35 ? Admx in 3/19 with CHF >>followed with Dr. Haroldine Laws until 6/19 in AHF Clinic  ? EF 40-45 in 10/2017 ? admx in 03/2018 with CHF, worsening anemia >> Tx with PRBCs ? Echo 04/2019: EF 50-55 ? Echocardiogram 11/21: EF 35-40   Diabetes mellitus  Atrial Flutter  Persistent AFib ? Not a candidate foranticoagulationdue to cirrhosis/varices  Accelerated idioventricular rhythm ? Beta-blocker DC'd >> restarted in 12/2019 due to reduced EF  SSS  6 sec post termination pause (during admit 12/21) >> beta-blocker DC'd; not felt to be PPM candidate   Hypertension   Hyperlipidemia  COPD, on chronic O2  Peripheral Arterial Disease  ? CT 04/2019: severe distal abdominal aorta and prox bilat CIA stenoses   Prior CV studies: Echocardiogram 01/24/20 EF 35-40, global HK, severe HK of inf and inf-lat wall, mildly reduced RVSF, severe BAE, mild to mod MR, mod TR, mild AV sclerosis (no AS)  Abd/Pelvic CTA 05/23/2019 High grade stenosis dist Abd Aorta prox to bifurcation; high grade stenosis in bilat proximal CIA Cirrhosis with stigmata of portal hypertension  Cardiomegaly Aortic atherosclerosis  Echocardiogram 05/20/2019 EF 50-55, no R WMA, mild LVH, low normal RV SF, RVSP 27.2, moderate LAE, mild RAE, mild MR, mild-moderate TR  Echo 11/15/2017 EF 40-45, inferior and lateral hypokinesis, grade  2 diastolic dysfunction, mild LAE, trivial TR, PASP 37  Right heart catheterization 05/29/2017 Findings: RA = 11 RV = 51/12 PA = 51/13 (32) PCW = 18 (v = 25-30) Fick cardiac output/index = 9.7/5.0 Thermo CO/CI = 7.6/3.9 PVR = 1.8 WU FA sat = 96% PA sat = 73%, 76% SVC sat = 67% Assessment: 1. Mild to moderately elevated R-sided pressures 2. Minimally elevated wedge pressure with prominent v-waves likely due to diastolic dysfunction 3. High-cardiac output likely due to cirrhotic physiology  4. No evidence of significant intra-cardiac shunting  Echo 05/25/2017 Mild LVH, EF 50-55, MAC, mild MR, moderate LAE, mildly decreased RVSF, moderate RAE, moderate TR, PASP 54  Echo 01/25/17 Mild LVH, EF 30-35, inferior/inferolateral and anterolateral HK, MAC, mild LAE, moderately reduced RVSF, mild RAE, PASP 27  Cardiac catheterization 08/15/16 LAD D1 ostial 65 LCx ostial stent patent with 40 ISR RCA proximal 95 LIMA-OM 2 occluded SVG-AM. patent SVG-D1 patent  History of Present Illness:    Ms. Cruzen was last seen in clinic 02/25/20 for post hospitalization f/u.  She had been admitted several times.  An echocardiogram in 12/2019 demonstrated worsening LVF again.  She was placed back on beta-blocker Rx.  She had 2 admissions in Dec 2021 with a/c CHF.  She had a post termination pause (6 sec) and her beta-blocker was stopped again.  She was off a lot of CHF meds due to low BP in the hospital.  When I saw her, she was volume overloaded.  I changed her Furosemide to Torsemide.  She returns for close f/u.  Her edema was slow to improve.  She called the on call PA and was told to remain on the Torsemide 40 mg in A and 20 mg in P until today.  Her leg edema is improved. Her shortness of breath is stable.  She has not had orthopnea, chest pain, syncope.     Past Medical History:  Diagnosis Date  . Cataract    OU  . Chronic combined systolic and diastolic CHF (congestive heart failure) ([place])   .  Cirrhosis of liver ([place])   . CKD (chronic kidney disease), stage III ([place])   . Coronary artery disease    a. s/p CABG 2001 w/ (LIMA-OM, SVG-D1, SVG-RCA). b. h/o multiple PCIs per Dr. Antionette Char note.  . Depression   . Esophageal varices ([place])    New 2013  . Gastroesophageal reflux disease   . History of pneumonia   . Hyperlipidemia   . Hypertension   . Hypertensive retinopathy    OU  . Obesity   . Osteoarthritis   . Pancytopenia ([place])   . Paroxysmal atrial flutter ([place])    a. dx 05/2016.  [name] Persistent atrial fibrillation ([place])    a. reported in hosp 07/2016, not on anticoag due to cirrhosis and liver disease, low platelets, varices.  . Thrombocytopenia (HCC)     Current Medications: Current Meds  Medication Sig  . albuterol (PROVENTIL HFA;VENTOLIN HFA) 108 (90 BASE) MCG/ACT inhaler Inhale 2 puffs into the lungs every 6 (six) hours as needed for wheezing or shortness of breath.   . ALPRAZolam (XANAX) 0.5 MG tablet Take 0.5 mg by mouth 3 (three) times daily as needed for sleep.  [name] ezetimibe (ZETIA) 10 MG tablet Take 10 mg by mouth at bedtime.  [name] FLUoxetine (PROZAC) 20 MG capsule Take 20 mg by mouth daily.   [name] ipratropium (ATROVENT) 0.03 % nasal spray Place 1 spray into both nostrils daily as needed (allergies).   . isosorbide mononitrate (IMDUR) 30 MG 24 hr tablet Take 0.5 tablets (15 mg total) by mouth daily.  [name] NITROSTAT 0.4 MG SL tablet Place 0.4 mg under the tongue every 5 (five) minutes as needed for chest pain (x 3 tabs daily).   . nystatin (MYCOSTATIN) 100000 UNIT/ML suspension Take 5 mLs (500,000 Units total) by mouth 4 (four) times daily for 7 days.  . ondansetron (ZOFRAN ODT) 4 MG disintegrating tablet Take 1 tablet (4 mg total) by mouth every 8 (eight) hours as needed for nausea or vomiting.  . pantoprazole (PROTONIX) 40 MG tablet Take 40 mg by mouth daily.  . polyethylene glycol (MIRALAX / GLYCOLAX) 17 g packet Take 25.5 g by mouth daily.  . psyllium (METAMUCIL SMOOTH  TEXTURE) 58.6 % powder Take 1 packet by mouth at bedtime.  [name] spironolactone (ALDACTONE) 25 MG tablet Take 25 mg by mouth daily.  . [DISCONTINUED] potassium chloride (KLOR-CON) 10 MEQ tablet Take 2 tablets by mouth twice a day for 3 days, then take 2 tablets by mouth once a day  . [DISCONTINUED] torsemide (DEMADEX) 20 MG tablet Take 2 tablets by mouth in the morning and 1 tablet by mouth in the evening for 3 days, then take 2 tablets by mouth once a day     Allergies:   Entresto [sacubitril-valsartan], Vibra-tab [doxycycline], Acetaminophen, Ace inhibitors, Cefaclor, Cephalexin, Penicillins, Pregabalin, and Tape   Social History   Tobacco Use  . Smoking status: Former Smoker    Packs/day: 0.50    Years: 20.00  Pack years: 10.00    Types: Cigarettes    Quit date: 07/21/1995    Years since quitting: 24.6  . Smokeless tobacco: Never Used  Vaping Use  . Vaping Use: Never used  Substance Use Topics  . Alcohol use: No  . Drug use: No     Family Hx: The patient's family history includes Coronary artery disease in her sister; Diabetes in her brother, father, mother, and sister; Glaucoma in her sister; Heart attack in her mother; Heart attack (age of onset: 17) in her father. There is no history of Colon cancer.  ROS see HPI   EKGs/Labs/Other Test Reviewed:    EKG:  EKG is not  ordered today.  The ekg ordered today demonstrates n/a  Recent Labs: 02/17/2020: ALT 18; Hemoglobin 9.9; Platelets 45 02/18/2020: Magnesium 2.2 02/19/2020: B Natriuretic Peptide 281.0 02/25/2020: BUN 8; Creatinine, Ser 0.91; Potassium 4.4; Sodium 137   Recent Lipid Panel Lab Results  Component Value Date/Time   CHOL 111 05/26/2017 06:37 AM   TRIG 93 05/26/2017 06:37 AM   HDL 38 (L) 05/26/2017 06:37 AM   CHOLHDL 2.9 05/26/2017 06:37 AM   LDLCALC 54 05/26/2017 06:37 AM      Risk Assessment/Calculations:     CHA2DS2-VASc Score = 6  This indicates a 9.7% annual risk of stroke. The patient's score is  based upon: CHF History: Yes HTN History: Yes Diabetes History: Yes Stroke History: No Vascular Disease History: Yes Age Score: 1 Gender Score: 1     Physical Exam:    VS:  BP 126/70   Pulse (!) 102   Ht _0  (1.575 m)   Wt 160 lb 6.4 oz (72.8 kg)   LMP 03/29/2011   BMI 29.34 kg/m     Wt Readings from Last 3 Encounters:  03/03/20 160 lb 6.4 oz (72.8 kg)  02/25/20 179 lb 3.2 oz (81.3 kg)  02/18/20 189 lb 9.5 oz (86 kg)     Constitutional:      Appearance: Healthy appearance. Not in distress.  Neck:     Vascular: JVR present.  Pulmonary:     Effort: Pulmonary effort is normal.     Breath sounds: No wheezing. No rales.  Cardiovascular:     Tachycardia present. Regular rhythm. Normal S1. Normal S2.     Murmurs: There is no murmur.  Edema:    Pretibial: bilateral trace edema of the pretibial area. Abdominal:     Palpations: Abdomen is soft.  Skin:    General: Skin is warm and dry.  Neurological:     Mental Status: Alert and oriented to person, place and time.     Cranial Nerves: Cranial nerves are intact.       ASSESSMENT & PLAN:    1. Acute on chronic HFrEF (heart failure with reduced ejection fraction) (HCC) EF 35-40.  NYHA IIb-III.  Volume status is improved. Her weight is down 19 lbs.  Her edema is improved.  She feels better.  Reduce Torsemide to 40 mg once daily.  Reduce K+ to 20 mEq once daily. Continue spironolactone.  Start Imdur 15 mg once daily.  F/u with me in 4 weeks.  Keep appt with Dr. Burt Knack in March.  Obtain BMET tomorrow (our lab has already left for the day).      Dispo:  Return in about 4 weeks (around 03/31/2020) for Routine Follow Up, w/ Richardson Dopp, PA-C.   Medication Adjustments/Labs and Tests Ordered: Current medicines are reviewed at length with the patient today.  Concerns regarding medicines are outlined above.  Tests Ordered: Orders Placed This Encounter  Procedures  . Basic metabolic panel   Medication Changes: Meds ordered this  encounter  Medications  . isosorbide mononitrate (IMDUR) 30 MG 24 hr tablet    Sig: Take 0.5 tablets (15 mg total) by mouth daily.    Dispense:  45 tablet    Refill:  3  . potassium chloride (KLOR-CON) 10 MEQ tablet    Sig: Take 2 tablets (20 mEq total) by mouth daily.    Dispense:  180 tablet    Refill:  1  . torsemide (DEMADEX) 20 MG tablet    Sig: Take 2 tablets (40 mg total) by mouth daily.    Dispense:  180 tablet    Refill:  1    Signed, Richardson Dopp, PA-C  03/03/2020 5:23 PM    [facility] [place], [place], [place]  [address] Phone: [phone]; Fax: [phone]

## 2020-03-03 ENCOUNTER — Ambulatory Visit: Payer: Medicare Other | Admitting: Physician Assistant

## 2020-03-03 ENCOUNTER — Other Ambulatory Visit: Payer: Self-pay

## 2020-03-03 ENCOUNTER — Encounter: Payer: Self-pay | Admitting: Physician Assistant

## 2020-03-03 VITALS — BP 126/70 | HR 102 | Ht 62.0 in | Wt 160.4 lb

## 2020-03-03 DIAGNOSIS — I083 Combined rheumatic disorders of mitral, aortic and tricuspid valves: Secondary | ICD-10-CM | POA: Diagnosis not present

## 2020-03-03 DIAGNOSIS — I130 Hypertensive heart and chronic kidney disease with heart failure and stage 1 through stage 4 chronic kidney disease, or unspecified chronic kidney disease: Secondary | ICD-10-CM | POA: Diagnosis not present

## 2020-03-03 DIAGNOSIS — I5023 Acute on chronic systolic (congestive) heart failure: Secondary | ICD-10-CM

## 2020-03-03 DIAGNOSIS — N183 Chronic kidney disease, stage 3 unspecified: Secondary | ICD-10-CM | POA: Diagnosis not present

## 2020-03-03 DIAGNOSIS — I5043 Acute on chronic combined systolic (congestive) and diastolic (congestive) heart failure: Secondary | ICD-10-CM | POA: Diagnosis not present

## 2020-03-03 DIAGNOSIS — I0981 Rheumatic heart failure: Secondary | ICD-10-CM | POA: Diagnosis not present

## 2020-03-03 MED ORDER — ISOSORBIDE MONONITRATE ER 30 MG PO TB24: 15.0000 mg | ORAL_TABLET | Freq: Every day | ORAL | 3 refills | Status: DC

## 2020-03-03 MED ORDER — TORSEMIDE 20 MG PO TABS: 40.0000 mg | ORAL_TABLET | Freq: Every day | ORAL | 1 refills | Status: DC

## 2020-03-03 MED ORDER — POTASSIUM CHLORIDE ER 10 MEQ PO TBCR: 20.0000 meq | EXTENDED_RELEASE_TABLET | Freq: Every day | ORAL | 1 refills | Status: DC

## 2020-03-03 NOTE — Patient Instructions (Signed)
Medication Instructions:  Your physician has recommended you make the following change in your medication:   1) Start Isosorbide 30 mg, take 0.5 tablet by mouth once a day 2) Decrease Potassium to 20 mEq (2 tablets) by mouth once a day 3) Decrease Torsemide to 40 mg (2 tablets) by mouth once a day  *If you need a refill on your cardiac medications before your next appointment, please call your pharmacy*  Lab Work: BMET ordered today to be done at [facility]  Testing/Procedures: None ordered today  Follow-Up: At [facility], you and your health needs are our priority.  As part of our continuing mission to provide you with exceptional heart care, we have created designated Provider Care Teams.  These Care Teams include your primary Cardiologist (physician) and Advanced Practice Providers (APPs -  Physician Assistants and Nurse Practitioners) who all work together to provide you with the care you need, when you need it.  We recommend signing up for the patient portal called "MyChart".  Sign up information is provided on this After Visit Summary.  MyChart is used to connect with patients for Virtual Visits (Telemedicine).  Patients are able to view lab/test results, encounter notes, upcoming appointments, etc.  Non-urgent messages can be sent to your provider as well.   To learn more about what you can do with MyChart, go to [email or web address].    Your next appointment:   4 week(s) on 06/06/20 at 2:15PM  The format for your next appointment:   In Person  Provider:   Richardson Dopp, PA-C

## 2020-03-04 DIAGNOSIS — I0981 Rheumatic heart failure: Secondary | ICD-10-CM | POA: Diagnosis not present

## 2020-03-04 DIAGNOSIS — I5043 Acute on chronic combined systolic (congestive) and diastolic (congestive) heart failure: Secondary | ICD-10-CM | POA: Diagnosis not present

## 2020-03-04 DIAGNOSIS — I083 Combined rheumatic disorders of mitral, aortic and tricuspid valves: Secondary | ICD-10-CM | POA: Diagnosis not present

## 2020-03-04 DIAGNOSIS — N183 Chronic kidney disease, stage 3 unspecified: Secondary | ICD-10-CM | POA: Diagnosis not present

## 2020-03-04 DIAGNOSIS — I130 Hypertensive heart and chronic kidney disease with heart failure and stage 1 through stage 4 chronic kidney disease, or unspecified chronic kidney disease: Secondary | ICD-10-CM | POA: Diagnosis not present

## 2020-03-04 DIAGNOSIS — I5023 Acute on chronic systolic (congestive) heart failure: Secondary | ICD-10-CM | POA: Diagnosis not present

## 2020-03-05 ENCOUNTER — Other Ambulatory Visit (INDEPENDENT_AMBULATORY_CARE_PROVIDER_SITE_OTHER): Payer: Self-pay | Admitting: Internal Medicine

## 2020-03-05 ENCOUNTER — Telehealth: Payer: Self-pay

## 2020-03-05 DIAGNOSIS — I130 Hypertensive heart and chronic kidney disease with heart failure and stage 1 through stage 4 chronic kidney disease, or unspecified chronic kidney disease: Secondary | ICD-10-CM | POA: Diagnosis not present

## 2020-03-05 DIAGNOSIS — I5023 Acute on chronic systolic (congestive) heart failure: Secondary | ICD-10-CM

## 2020-03-05 DIAGNOSIS — K7460 Unspecified cirrhosis of liver: Secondary | ICD-10-CM | POA: Diagnosis not present

## 2020-03-05 DIAGNOSIS — R609 Edema, unspecified: Secondary | ICD-10-CM | POA: Diagnosis not present

## 2020-03-05 DIAGNOSIS — M545 Low back pain, unspecified: Secondary | ICD-10-CM | POA: Diagnosis not present

## 2020-03-05 DIAGNOSIS — I5043 Acute on chronic combined systolic (congestive) and diastolic (congestive) heart failure: Secondary | ICD-10-CM | POA: Diagnosis not present

## 2020-03-05 DIAGNOSIS — I083 Combined rheumatic disorders of mitral, aortic and tricuspid valves: Secondary | ICD-10-CM | POA: Diagnosis not present

## 2020-03-05 DIAGNOSIS — N183 Chronic kidney disease, stage 3 unspecified: Secondary | ICD-10-CM | POA: Diagnosis not present

## 2020-03-05 DIAGNOSIS — N1831 Chronic kidney disease, stage 3a: Secondary | ICD-10-CM | POA: Diagnosis not present

## 2020-03-05 DIAGNOSIS — I0981 Rheumatic heart failure: Secondary | ICD-10-CM | POA: Diagnosis not present

## 2020-03-05 DIAGNOSIS — Z515 Encounter for palliative care: Secondary | ICD-10-CM | POA: Diagnosis not present

## 2020-03-05 DIAGNOSIS — I5042 Chronic combined systolic (congestive) and diastolic (congestive) heart failure: Secondary | ICD-10-CM | POA: Diagnosis not present

## 2020-03-05 LAB — BASIC METABOLIC PANEL
BUN/Creatinine Ratio: 10 — ABNORMAL LOW (ref 12–28)
BUN: 11 mg/dL (ref 8–27)
CO2: 27 mmol/L (ref 20–29)
Calcium: 9.4 mg/dL (ref 8.7–10.3)
Chloride: 97 mmol/L (ref 96–106)
Creatinine, Ser: 1.11 mg/dL — ABNORMAL HIGH (ref 0.57–1.00)
GFR calc Af Amer: 58 mL/min/{1.73_m2} — ABNORMAL LOW (ref >59)
GFR calc non Af Amer: 50 mL/min/{1.73_m2} — ABNORMAL LOW (ref >59)
Glucose: 112 mg/dL — ABNORMAL HIGH (ref 65–99)
Potassium: 4.2 mmol/L (ref 3.5–5.2)
Sodium: 139 mmol/L (ref 134–144)

## 2020-03-05 NOTE — Telephone Encounter (Signed)
I agree, will not refill this medication until patient comes back to the clinic as she is already on torsemide.  Thanks

## 2020-03-05 NOTE — Telephone Encounter (Signed)
-----   Message from Dollene Primrose, RN sent at 03/05/2020  9:21 AM EST -----  ----- Message ----- From: Liliane Shi, PA-C Sent: 03/05/2020   8:13 AM EST To: Cv Div [facility] Triage  Creatinine increased.  K+ normal. PLAN:  - Continue current medications/treatment plan and follow up as scheduled.  - Repeat BMET in 2 weeks Richardson Dopp, PA-C    03/05/2020 8:12 AM

## 2020-03-05 NOTE — Telephone Encounter (Signed)
Last seen 02/11/2020 by Dr. Laural Golden for Cirrhosis. Looks like this had been d/c and she was started on Torsemide 20 mg Two po Qd by Dr. Richardson Dopp.

## 2020-03-05 NOTE — Telephone Encounter (Signed)
The patient has been notified of the result and verbalized understanding.  All questions (if any) were answered. Patient will come back on 03/19/20 for repeat labs. Orders in for lab work. Mady Haagensen, [place] 03/05/2020 10:42 AM

## 2020-03-09 DIAGNOSIS — I0981 Rheumatic heart failure: Secondary | ICD-10-CM | POA: Diagnosis not present

## 2020-03-09 DIAGNOSIS — I130 Hypertensive heart and chronic kidney disease with heart failure and stage 1 through stage 4 chronic kidney disease, or unspecified chronic kidney disease: Secondary | ICD-10-CM | POA: Diagnosis not present

## 2020-03-09 DIAGNOSIS — I083 Combined rheumatic disorders of mitral, aortic and tricuspid valves: Secondary | ICD-10-CM | POA: Diagnosis not present

## 2020-03-09 DIAGNOSIS — I5043 Acute on chronic combined systolic (congestive) and diastolic (congestive) heart failure: Secondary | ICD-10-CM | POA: Diagnosis not present

## 2020-03-09 DIAGNOSIS — N183 Chronic kidney disease, stage 3 unspecified: Secondary | ICD-10-CM | POA: Diagnosis not present

## 2020-03-11 ENCOUNTER — Telehealth: Payer: Self-pay | Admitting: Cardiovascular Disease

## 2020-03-11 DIAGNOSIS — I0981 Rheumatic heart failure: Secondary | ICD-10-CM | POA: Diagnosis not present

## 2020-03-11 DIAGNOSIS — I5043 Acute on chronic combined systolic (congestive) and diastolic (congestive) heart failure: Secondary | ICD-10-CM | POA: Diagnosis not present

## 2020-03-11 DIAGNOSIS — N183 Chronic kidney disease, stage 3 unspecified: Secondary | ICD-10-CM | POA: Diagnosis not present

## 2020-03-11 DIAGNOSIS — I083 Combined rheumatic disorders of mitral, aortic and tricuspid valves: Secondary | ICD-10-CM | POA: Diagnosis not present

## 2020-03-11 DIAGNOSIS — I130 Hypertensive heart and chronic kidney disease with heart failure and stage 1 through stage 4 chronic kidney disease, or unspecified chronic kidney disease: Secondary | ICD-10-CM | POA: Diagnosis not present

## 2020-03-11 NOTE — Telephone Encounter (Signed)
Ben with [facility] wanted to pass along a non urgent message to Dr. Domingo Sep states the patient's weight is down to 150 lbs today, that her swelling is down, no dizziness today and her BP was 120/68. He wanted to let Dr. Burt Knack know that her resting heart rate was high at 120 BPM today. He states he does not need a call back unless Dr. Burt Knack has questions or would like to speak to him, he just wanted to make him aware.   Thank you!

## 2020-03-11 NOTE — Telephone Encounter (Signed)
Thanks for letting me know!

## 2020-03-12 ENCOUNTER — Encounter (INDEPENDENT_AMBULATORY_CARE_PROVIDER_SITE_OTHER): Payer: Self-pay | Admitting: Internal Medicine

## 2020-03-12 ENCOUNTER — Ambulatory Visit (INDEPENDENT_AMBULATORY_CARE_PROVIDER_SITE_OTHER): Payer: Medicare Other | Admitting: Internal Medicine

## 2020-03-12 ENCOUNTER — Other Ambulatory Visit: Payer: Self-pay

## 2020-03-12 VITALS — BP 123/72 | HR 116 | Temp 99.0°F | Ht 63.0 in | Wt 156.7 lb

## 2020-03-12 DIAGNOSIS — I083 Combined rheumatic disorders of mitral, aortic and tricuspid valves: Secondary | ICD-10-CM | POA: Diagnosis not present

## 2020-03-12 DIAGNOSIS — I130 Hypertensive heart and chronic kidney disease with heart failure and stage 1 through stage 4 chronic kidney disease, or unspecified chronic kidney disease: Secondary | ICD-10-CM | POA: Diagnosis not present

## 2020-03-12 DIAGNOSIS — K219 Gastro-esophageal reflux disease without esophagitis: Secondary | ICD-10-CM | POA: Diagnosis not present

## 2020-03-12 DIAGNOSIS — K7460 Unspecified cirrhosis of liver: Secondary | ICD-10-CM | POA: Diagnosis not present

## 2020-03-12 DIAGNOSIS — I0981 Rheumatic heart failure: Secondary | ICD-10-CM | POA: Diagnosis not present

## 2020-03-12 DIAGNOSIS — D649 Anemia, unspecified: Secondary | ICD-10-CM | POA: Diagnosis not present

## 2020-03-12 DIAGNOSIS — I5043 Acute on chronic combined systolic (congestive) and diastolic (congestive) heart failure: Secondary | ICD-10-CM | POA: Diagnosis not present

## 2020-03-12 DIAGNOSIS — K7689 Other specified diseases of liver: Secondary | ICD-10-CM | POA: Diagnosis not present

## 2020-03-12 DIAGNOSIS — N183 Chronic kidney disease, stage 3 unspecified: Secondary | ICD-10-CM | POA: Diagnosis not present

## 2020-03-12 MED ORDER — POLYETHYLENE GLYCOL 3350 17 G PO PACK: 17.0000 g | PACK | Freq: Every day | ORAL | Status: DC

## 2020-03-12 NOTE — Patient Instructions (Signed)
You can wait and have this blood work along with other blood work requested by Dr. Burt Knack. Please take Metamucil and polyethylene glycol every day.  You can decrease dose as discussed.

## 2020-03-12 NOTE — Progress Notes (Signed)
Presenting complaint;  Follow-up for chronic liver disease, rectal bleeding and lower extremity edema.  Database and subjective:  Patient is 70 year old Caucasian female who is here for scheduled visit accompanied by her sister-in-law. 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  On that visit she weighed 185 pounds.  Patient indicated that she had gained 15 pounds since her hospitalization earlier in the month and she was not responding to [facility]. Patient was not having any shortness of breath.  Patient was advised to go to emergency room but she wanted to try furosemide for a day and see how she does.  She did not see any improvement after taking 2 doses of furosemide and therefore reported to the emergency room on 02/12/2020. I was contacted by ER physician Dr. Octaviano Glow.  I recommended that she should receive IV albumin since her albumin was very low.  She also received IV diuretics and began to diurese.  She was finally discharged on 02/19/2020.  Patient does not remember how much she weighed at the time of discharge.  She was switched back to torsemide.  Patient says she feels much better.  Now she is able to use her shoes.  On her scale this morning she weighed 149 pounds.  On our scale she weighs 156 pounds.  She complains of palpitations.  She does not have dyspnea at rest or when flat.  She just does not have energy.  Her bowels are moving regularly.  She has not noted any blood with her bowel movements since the last visit.  She also denies abdominal pain. She has not been confused.  Her appetite is fair. She is scheduled to have blood work in 2 weeks. She wonders if she should be back on metoprolol for heart rate control.  I recommended she should contact Dr. Antionette Char office. She also complains of heartburn when she takes Prozac.  She does not feel she needs this medication anymore.  She  will contact Dr. Edythe Lynn office to see if she could reduce the dose.  Current Medications: Outpatient Encounter Medications as of 03/12/2020  Medication Sig  . albuterol (PROVENTIL HFA;VENTOLIN HFA) 108 (90 BASE) MCG/ACT inhaler Inhale 2 puffs into the lungs every 6 (six) hours as needed for wheezing or shortness of breath.   . ALPRAZolam (XANAX) 0.5 MG tablet Take 0.5 mg by mouth 3 (three) times daily as needed for sleep.  . cyclobenzaprine (FLEXERIL) 10 MG tablet Take 10 mg by mouth at bedtime.  [name] ezetimibe (ZETIA) 10 MG tablet Take 10 mg by mouth at bedtime.  [name] FLUoxetine (PROZAC) 20 MG capsule Take 20 mg by mouth daily.   [name] ipratropium (ATROVENT) 0.03 % nasal spray Place 1 spray into both nostrils daily as needed (allergies).   . isosorbide mononitrate (IMDUR) 30 MG 24 hr tablet Take 0.5 tablets (15 mg total) by mouth daily.  [name] NITROSTAT 0.4 MG SL tablet Place 0.4 mg under the tongue every 5 (five) minutes as needed for chest pain (x 3 tabs daily).   . ondansetron (ZOFRAN ODT) 4 MG disintegrating tablet Take 1 tablet (4 mg total) by mouth every 8 (eight) hours as needed for nausea or vomiting.  . Oxycodone HCl 10 MG TABS Take 10 mg by mouth daily.  . pantoprazole (PROTONIX) 40 MG tablet Take 40 mg by mouth daily.  . polyethylene glycol (MIRALAX / GLYCOLAX) 17 g packet Take 25.5 g by  mouth daily.  . potassium chloride (KLOR-CON) 10 MEQ tablet Take 2 tablets (20 mEq total) by mouth daily.  [name] spironolactone (ALDACTONE) 25 MG tablet Take 25 mg by mouth daily.  [name] torsemide (DEMADEX) 20 MG tablet Take 2 tablets (40 mg total) by mouth daily.  . psyllium (METAMUCIL SMOOTH TEXTURE) 58.6 % powder Take 1 packet by mouth at bedtime. (Patient not taking: Reported on 03/12/2020)   No facility-administered encounter medications on file as of 03/12/2020.     Objective: Blood pressure 123/72, pulse (!) 116, temperature 99 F (37.2 C), temperature source Oral, height 5' 3"  (1.6 m), weight 156 lb 11.2 oz  (71.1 kg), last menstrual period 03/29/2011. Patient is alert and in no acute distress. She is wearing a mask. She does not have asterixis. Conjunctiva is pink. Sclera is icteric Oropharyngeal mucosa is normal. No neck masses or thyromegaly noted. Cardiac exam withir regular rhythm normal S1 and S2.  Lungs are clear to auscultation. Abdomen is full but not distended.  She has umbilical/periumbilical hernia which is completely reducible when she is supine.  Liver edge is indistinct.  Spleen is easily palpable.  Flanks are not bulging. She has trace pitting edema around ankles.  Most of her edema now appears to be nonpitting.  Labs/studies Results:  CBC Latest Ref Rng & Units 02/17/2020 02/15/2020 02/13/2020  WBC 4.0 - 10.5 K/uL 5.3 4.7 5.8  Hemoglobin 12.0 - 15.0 g/dL 9.9(L) 10.3(L) 12.0  Hematocrit 36.0 - 46.0 % 30.9(L) 31.8(L) 36.5  Platelets 150 - 400 K/uL 45(L) 41(L) 49(L)    CMP Latest Ref Rng & Units 03/04/2020 02/25/2020 02/18/2020  Glucose 65 - 99 mg/dL 112(H) 89 100(H)  BUN 8 - 27 mg/dL 11 8 24(H)  Creatinine 0.57 - 1.00 mg/dL 1.11(H) 0.91 1.26(H)  Sodium 134 - 144 mmol/L 139 137 134(L)  Potassium 3.5 - 5.2 mmol/L 4.2 4.4 4.4  Chloride 96 - 106 mmol/L 97 99 100  CO2 20 - 29 mmol/L 27 24 24   Calcium 8.7 - 10.3 mg/dL 9.4 9.5 9.3  Total Protein 6.5 - 8.1 g/dL - - -  Total Bilirubin 0.3 - 1.2 mg/dL - - -  Alkaline Phos 38 - 126 U/L - - -  AST 15 - 41 U/L - - -  ALT 0 - 44 U/L - - -    Hepatic Function Latest Ref Rng & Units 02/17/2020 02/15/2020 02/13/2020  Total Protein 6.5 - 8.1 g/dL 6.9 6.2(L) 6.8  Albumin 3.5 - 5.0 g/dL 4.1 3.0(L) 2.7(L)  AST 15 - 41 U/L 42(H) 53(H) 58(H)  ALT 0 - 44 U/L 18 21 26   Alk Phosphatase 38 - 126 U/L 43 69 83  Total Bilirubin 0.3 - 1.2 mg/dL 6.3(H) 4.7(H) 5.2(H)  Bilirubin, Direct 0.0 - 0.2 mg/dL 1.3(H) - -    ECHO results from 01/24/2020 study reviewed. Summary as follows.  Left ventricular ejection fraction, by estimation, is 35 to 40%.  The left ventricle has moderately decreased function. The left ventricle demonstrates global hypokinesis. Left ventricular diastolic function could not be evaluated. There is severe hypokinesis of the left ventricular, entire inferior wall and inferolateral wall.reduced. The right ventricular size is normal. There is mildly elevated pulmonary artery systolic pressure. 3. Left atrial size was severely dilated. 4. Right atrial size was severely dilated. 5. The mitral valve is normal in structure. Mild to moderate mitral valve regurgitation. 6. Tricuspid valve regurgitation is moderate. 7. The aortic valve is tricuspid. Aortic valve regurgitation is not visualized. Mild aortic valve  sclerosis is present, with no evidence of aortic valve stenosis. 8. The inferior vena cava is normal in size with <50% respiratory variability, suggesting right atrial pressure of 8 mmHg.   Assessment:  #1.  Fluid overload.  Fluid overload has been mainly in the form of massive lower extremity edema.  She required hospitalization for diuresis.  Since her last visit 4 weeks ago she has lost 29 pounds.  Fluid overload in my opinion is primarily due to CHF.  Hypoalbuminemia resulting from chronic liver disease is making management worse.  I do not believe fluid overload is primarily due to advanced cirrhosis as she does not have ascites on exam.  She did respond to IV albumin infusion and this could be used in future on as-needed basis.  She appears to be close to her baseline as well as fluid status is concerned.  Now we want to make sure she does not go to the other extreme and develop hypovolemia and kidney dysfunction.  She may want to check with Dr. Burt Knack if he wants her weight within certain range.  #2.  Cholestasis.  Patient is jaundiced today.  Most recent bilirubin from 02/17/2020 was 6.3 she has been higher than prior values.  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 Based on imaging studies from November 2021 she does not have extrahepatic obstruction.  Will monitor.  #3.  Cirrhosis secondary to NASH.  Patient unfortunately is not a candidate for liver transplant because of cardiac disease and her age.  She is up-to-date regarding [place] screening.  #4.  GERD.  She is having heartburn with Prozac.  She should try taking Prozac at a different time.  #5.  Anemia.  Anemia appears to be primarily due to chronic disease.  She has history of hematochezia felt to be secondary to hemorrhoids but she is not bleeding anymore.  #6.  History of A. fib.  Her heart rate is above 100.  She is to contact Dr. Antionette Char office regarding further recommendations.   Plan:  Patient advised to take Prozac at a different time with food so that she would not have heartburn. Patient advised to take Metamucil and polyethylene glycol every day.  She can titrate the dose. Patient advised to to do daily Stroop test on her phone and keep a record.  I showed them the app and and how to use it.  If she passes Stroop test she can drive otherwise not. Patient will go to the lab on 03/27/2020 for CBC, LFTs and INR. Office visit in 2 months.

## 2020-03-19 ENCOUNTER — Other Ambulatory Visit: Payer: Medicare Other

## 2020-03-19 DIAGNOSIS — I0981 Rheumatic heart failure: Secondary | ICD-10-CM | POA: Diagnosis not present

## 2020-03-19 DIAGNOSIS — I5043 Acute on chronic combined systolic (congestive) and diastolic (congestive) heart failure: Secondary | ICD-10-CM | POA: Diagnosis not present

## 2020-03-19 DIAGNOSIS — I130 Hypertensive heart and chronic kidney disease with heart failure and stage 1 through stage 4 chronic kidney disease, or unspecified chronic kidney disease: Secondary | ICD-10-CM | POA: Diagnosis not present

## 2020-03-19 DIAGNOSIS — N183 Chronic kidney disease, stage 3 unspecified: Secondary | ICD-10-CM | POA: Diagnosis not present

## 2020-03-19 DIAGNOSIS — I083 Combined rheumatic disorders of mitral, aortic and tricuspid valves: Secondary | ICD-10-CM | POA: Diagnosis not present

## 2020-03-23 ENCOUNTER — Other Ambulatory Visit: Payer: Medicare Other

## 2020-03-25 ENCOUNTER — Other Ambulatory Visit: Payer: Medicare Other

## 2020-03-26 ENCOUNTER — Other Ambulatory Visit: Payer: Medicare Other

## 2020-03-26 ENCOUNTER — Other Ambulatory Visit: Payer: Self-pay

## 2020-03-26 ENCOUNTER — Ambulatory Visit: Payer: Medicare Other | Admitting: Physician Assistant

## 2020-03-26 DIAGNOSIS — I5023 Acute on chronic systolic (congestive) heart failure: Secondary | ICD-10-CM | POA: Diagnosis not present

## 2020-03-26 DIAGNOSIS — Z20822 Contact with and (suspected) exposure to covid-19: Secondary | ICD-10-CM

## 2020-03-26 LAB — BASIC METABOLIC PANEL
BUN/Creatinine Ratio: 9 — ABNORMAL LOW (ref 12–28)
BUN: 11 mg/dL (ref 8–27)
CO2: 24 mmol/L (ref 20–29)
Calcium: 8.8 mg/dL (ref 8.7–10.3)
Chloride: 95 mmol/L — ABNORMAL LOW (ref 96–106)
Creatinine, Ser: 1.17 mg/dL — ABNORMAL HIGH (ref 0.57–1.00)
GFR calc Af Amer: 54 mL/min/{1.73_m2} — ABNORMAL LOW (ref >59)
GFR calc non Af Amer: 47 mL/min/{1.73_m2} — ABNORMAL LOW (ref >59)
Glucose: 211 mg/dL — ABNORMAL HIGH (ref 65–99)
Potassium: 3.6 mmol/L (ref 3.5–5.2)
Sodium: 134 mmol/L (ref 134–144)

## 2020-03-27 ENCOUNTER — Telehealth: Payer: Self-pay | Admitting: Cardiovascular Disease

## 2020-03-27 DIAGNOSIS — I083 Combined rheumatic disorders of mitral, aortic and tricuspid valves: Secondary | ICD-10-CM | POA: Diagnosis not present

## 2020-03-27 DIAGNOSIS — I0981 Rheumatic heart failure: Secondary | ICD-10-CM | POA: Diagnosis not present

## 2020-03-27 DIAGNOSIS — I130 Hypertensive heart and chronic kidney disease with heart failure and stage 1 through stage 4 chronic kidney disease, or unspecified chronic kidney disease: Secondary | ICD-10-CM | POA: Diagnosis not present

## 2020-03-27 DIAGNOSIS — I5043 Acute on chronic combined systolic (congestive) and diastolic (congestive) heart failure: Secondary | ICD-10-CM | POA: Diagnosis not present

## 2020-03-27 DIAGNOSIS — N183 Chronic kidney disease, stage 3 unspecified: Secondary | ICD-10-CM | POA: Diagnosis not present

## 2020-03-27 NOTE — Telephone Encounter (Signed)
Reviewed with Dr Burt Knack who recommends patient take extra dose of torsemide and potassium today. She should increase potassium and torsemide to twice daily over the weekend.  Call next week with update. I spoke with Quita Skye and gave her instructions from Dr Burt Knack

## 2020-03-27 NOTE — Telephone Encounter (Signed)
Received call from [facility] with [facility] stating that the patient has gained approximately 9 pounds in 8 days but patient does not keep daily log of her weight. Patient has been taking her Spironolactone according to [facility] RN. Patient does how lower extremity swelling.   Attempted to call the patient to assess, no answer, left message to return call to our office.

## 2020-03-27 NOTE — Telephone Encounter (Signed)
Patient's sister in law Quita Skye) returned call and patient gave me permission to speak with her. Quita Skye reports patient has swelling in lower extremities. First noticed swelling on 1/25. Not as bad as it was before Thanksgiving and Christmas. Reports the following weights- 1/28-165 lb 1/27-161 lb 1/20- 156 lb Weighs daily but does not record all readings.  BP 116/65. Heart rate 116. Quita Skye reports heart rate is always above 100. Patient uses home oxygen as needed. Using it a little more now but not all the time.  Shortness of breath is mainly with exertion. Covid test yesterday due to recent exposure.  Has not received results.  Is taking aldactone, potassium and torsemide as listed. Quita Skye requests we call her back at [phone]. Will review with Dr Burt Knack

## 2020-03-27 NOTE — Telephone Encounter (Signed)
Pt c/o swelling: STAT is pt has developed SOB within 24 hours  1) How much weight have you gained and in what time span? Patient gained 9 lbs in 8 days, per Inez Catalina with [place].  2) If swelling, where is the swelling located? Both legs, but mainly the right per Inez Catalina with [facility].  3) Are you currently taking a fluid pill? Yes   4) Are you currently SOB? Inez Catalina with [facility] states the patient is slightly SOB, but it's due to her being on oxygen. She states the patient also had a COVID test on 03/26/20 and is currently awaiting results.  5) Do you have a log of your daily weights (if so, list)? 03/27/20: 161 lbs  Per Inez Catalina with [facility], the patient does not keep a log of her daily weights.   6) Have you gained 3 pounds in a day or 5 pounds in a week? Yes, patient gained 9 lbs in 8 days, per Inez Catalina with [facility]  7) Have you traveled recently? No

## 2020-03-28 DIAGNOSIS — E1122 Type 2 diabetes mellitus with diabetic chronic kidney disease: Secondary | ICD-10-CM | POA: Diagnosis not present

## 2020-03-28 DIAGNOSIS — I5023 Acute on chronic systolic (congestive) heart failure: Secondary | ICD-10-CM | POA: Diagnosis not present

## 2020-03-28 DIAGNOSIS — N183 Chronic kidney disease, stage 3 unspecified: Secondary | ICD-10-CM | POA: Diagnosis not present

## 2020-03-28 DIAGNOSIS — I130 Hypertensive heart and chronic kidney disease with heart failure and stage 1 through stage 4 chronic kidney disease, or unspecified chronic kidney disease: Secondary | ICD-10-CM | POA: Diagnosis not present

## 2020-03-28 LAB — SARS-COV-2, NAA 2 DAY TAT

## 2020-03-28 LAB — NOVEL CORONAVIRUS, NAA: SARS-CoV-2, NAA: NOT DETECTED

## 2020-03-29 DIAGNOSIS — D696 Thrombocytopenia, unspecified: Secondary | ICD-10-CM | POA: Diagnosis not present

## 2020-03-29 DIAGNOSIS — E7220 Disorder of urea cycle metabolism, unspecified: Secondary | ICD-10-CM | POA: Diagnosis not present

## 2020-03-30 ENCOUNTER — Telehealth: Payer: Self-pay | Admitting: Cardiovascular Disease

## 2020-03-30 DIAGNOSIS — K7460 Unspecified cirrhosis of liver: Secondary | ICD-10-CM | POA: Diagnosis not present

## 2020-03-30 DIAGNOSIS — K7689 Other specified diseases of liver: Secondary | ICD-10-CM | POA: Diagnosis not present

## 2020-03-30 DIAGNOSIS — H43822 Vitreomacular adhesion, left eye: Secondary | ICD-10-CM | POA: Diagnosis not present

## 2020-03-30 DIAGNOSIS — D649 Anemia, unspecified: Secondary | ICD-10-CM | POA: Diagnosis not present

## 2020-03-30 DIAGNOSIS — H43811 Vitreous degeneration, right eye: Secondary | ICD-10-CM | POA: Diagnosis not present

## 2020-03-30 NOTE — Telephone Encounter (Signed)
Follow up:     Patient daughter returning call back from last week. Message can be left.

## 2020-03-30 NOTE — Telephone Encounter (Signed)
Samantha Shi, PA-C  03/27/2020 8:19 AM EST      Creatinine stable. K+ normal. Glucose elevated.  PLAN:  -Continue current medications/treatment plan and follow up as scheduled.  -F/u with PCP for diabetes mellitus. -Send copy to PCP. Richardson Dopp, PA-C   03/27/2020 8:15 AM    The patients sister-in-law Samantha [name] (on [place]) has been notified of the result and verbalized understanding.  All questions (if any) were answered. Samantha Alpha, LPN 1/56/1537 94:32 AM   Samantha [name] states she will call the pts PCP today and seek an appt with them, for further management of her diabetes. Samantha [name] verbalized understanding and agrees with this plan. Will send pts lab result to pts PCP Dr. Quintin Alto, via epic.

## 2020-03-30 NOTE — Telephone Encounter (Signed)
Pt returning call from [facility] for lab results.

## 2020-03-31 ENCOUNTER — Telehealth: Payer: Self-pay | Admitting: Cardiovascular Disease

## 2020-03-31 DIAGNOSIS — D649 Anemia, unspecified: Secondary | ICD-10-CM | POA: Diagnosis not present

## 2020-03-31 DIAGNOSIS — I25119 Atherosclerotic heart disease of native coronary artery with unspecified angina pectoris: Secondary | ICD-10-CM | POA: Diagnosis not present

## 2020-03-31 DIAGNOSIS — E1122 Type 2 diabetes mellitus with diabetic chronic kidney disease: Secondary | ICD-10-CM | POA: Diagnosis not present

## 2020-03-31 DIAGNOSIS — I10 Essential (primary) hypertension: Secondary | ICD-10-CM | POA: Diagnosis not present

## 2020-03-31 DIAGNOSIS — M791 Myalgia, unspecified site: Secondary | ICD-10-CM | POA: Diagnosis not present

## 2020-03-31 DIAGNOSIS — I5023 Acute on chronic systolic (congestive) heart failure: Secondary | ICD-10-CM | POA: Diagnosis not present

## 2020-03-31 DIAGNOSIS — E7849 Other hyperlipidemia: Secondary | ICD-10-CM | POA: Diagnosis not present

## 2020-03-31 DIAGNOSIS — E559 Vitamin D deficiency, unspecified: Secondary | ICD-10-CM | POA: Diagnosis not present

## 2020-03-31 LAB — CBC
HCT: 36.3 % (ref 35.0–45.0)
Hemoglobin: 12.5 g/dL (ref 11.7–15.5)
MCH: 31.3 pg (ref 27.0–33.0)
MCHC: 34.4 g/dL (ref 32.0–36.0)
MCV: 91 fL (ref 80.0–100.0)
MPV: 11.4 fL (ref 7.5–12.5)
Platelets: 63 10*3/uL — ABNORMAL LOW (ref 140–400)
RBC: 3.99 10*6/uL (ref 3.80–5.10)
RDW: 13.4 % (ref 11.0–15.0)
WBC: 4.4 10*3/uL (ref 3.8–10.8)

## 2020-03-31 LAB — HEPATIC FUNCTION PANEL
AG Ratio: 0.8 (calc) — ABNORMAL LOW (ref 1.0–2.5)
ALT: 18 U/L (ref 6–29)
AST: 45 U/L — ABNORMAL HIGH (ref 10–35)
Albumin: 3 g/dL — ABNORMAL LOW (ref 3.6–5.1)
Alkaline phosphatase (APISO): 116 U/L (ref 37–153)
Bilirubin, Direct: 1.7 mg/dL — ABNORMAL HIGH (ref 0.0–0.2)
Globulin: 3.8 g/dL (calc) — ABNORMAL HIGH (ref 1.9–3.7)
Indirect Bilirubin: 3.7 mg/dL (calc) — ABNORMAL HIGH (ref 0.2–1.2)
Total Bilirubin: 5.4 mg/dL — ABNORMAL HIGH (ref 0.2–1.2)
Total Protein: 6.8 g/dL (ref 6.1–8.1)

## 2020-03-31 LAB — PROTIME-INR
INR: 1.5 — ABNORMAL HIGH
Prothrombin Time: 15.2 s — ABNORMAL HIGH (ref 9.0–11.5)

## 2020-03-31 MED ORDER — METOPROLOL TARTRATE 25 MG PO TABS: 12.5000 mg | ORAL_TABLET | Freq: Every day | ORAL | 3 refills | Status: DC | PRN

## 2020-03-31 NOTE — Telephone Encounter (Signed)
Informed the patient she may take metoprolol tartrate 12.5 mg once daily as needed for palpitation and HR >120 ONLY. Confirmed appointment with Richardson Dopp next week. She was grateful for call and agrees with plan.

## 2020-03-31 NOTE — Telephone Encounter (Signed)
The patient saw Dr. Quintin Alto today and was told she is is in afib and needs to start back on her Toprol, but to call Cardiology first.  The patient states her heart is "constantly pounding" and races all the time.  She states her HR is consistently 100-120s. She has been prescribed Toprol before and was instructed to stop last May. Reviewed medications with patient and current list is correct. She requests a call back today with Scott's recommendations.

## 2020-03-31 NOTE — Telephone Encounter (Signed)
Pt c/o medication issue:  1. Name of Medication: Metoprolol 25 mg  2. How are you currently taking this medication (dosage and times per day)? Not currently taking  3. Are you having a reaction (difficulty breathing--STAT)? no  4. What is your medication issue? Patient's sister in law states due to the patient's HR her PCP suggested she start taking Metoprolol succinate again. She states the patient's HR has been staying above 100, ranging 111-116 and sometimes reaching 120's. Please advise.

## 2020-03-31 NOTE — Telephone Encounter (Signed)
She had 6 sec post termination pauses in the past and was taken off of beta-blocker.   I think the best thing to do is try as needed beta-blocker. Let's give her metoprolol tartrate 12.5 mg once daily as needed for palpitations. She should not take more than once daily.  She should not take it unless her HR is 120 or higher. Thanks [employer], [place]    03/31/2020 4:49 PM

## 2020-04-01 NOTE — Telephone Encounter (Signed)
Increase Torsemide to 40 mg twice daily x 3 more days Increase K+ to 20 mEq twice daily x 3 more days. After 3 days, resume once daily dosing for both. BMET in 1 week. Richardson Dopp, PA-C    04/01/2020 3:02 PM

## 2020-04-02 DIAGNOSIS — I5042 Chronic combined systolic (congestive) and diastolic (congestive) heart failure: Secondary | ICD-10-CM | POA: Diagnosis not present

## 2020-04-02 DIAGNOSIS — Z515 Encounter for palliative care: Secondary | ICD-10-CM | POA: Diagnosis not present

## 2020-04-02 DIAGNOSIS — N1831 Chronic kidney disease, stage 3a: Secondary | ICD-10-CM | POA: Diagnosis not present

## 2020-04-02 DIAGNOSIS — K7460 Unspecified cirrhosis of liver: Secondary | ICD-10-CM | POA: Diagnosis not present

## 2020-04-08 ENCOUNTER — Ambulatory Visit: Payer: Medicare Other | Admitting: Physician Assistant

## 2020-04-08 ENCOUNTER — Encounter: Payer: Self-pay | Admitting: Physician Assistant

## 2020-04-08 ENCOUNTER — Other Ambulatory Visit: Payer: Self-pay

## 2020-04-08 VITALS — BP 100/40 | HR 107 | Ht 62.0 in | Wt 171.2 lb

## 2020-04-08 DIAGNOSIS — K7460 Unspecified cirrhosis of liver: Secondary | ICD-10-CM

## 2020-04-08 DIAGNOSIS — I25119 Atherosclerotic heart disease of native coronary artery with unspecified angina pectoris: Secondary | ICD-10-CM

## 2020-04-08 DIAGNOSIS — I5023 Acute on chronic systolic (congestive) heart failure: Secondary | ICD-10-CM

## 2020-04-08 DIAGNOSIS — I4819 Other persistent atrial fibrillation: Secondary | ICD-10-CM

## 2020-04-08 MED ORDER — POTASSIUM CHLORIDE ER 20 MEQ PO TBCR: 20.0000 meq | EXTENDED_RELEASE_TABLET | Freq: Two times a day (BID) | ORAL | 3 refills | Status: DC

## 2020-04-08 MED ORDER — TORSEMIDE 40 MG PO TABS: 40.0000 mg | ORAL_TABLET | Freq: Two times a day (BID) | ORAL | 3 refills | Status: DC

## 2020-04-08 MED ORDER — POTASSIUM CHLORIDE ER 10 MEQ PO TBCR: 20.0000 meq | EXTENDED_RELEASE_TABLET | Freq: Two times a day (BID) | ORAL | 3 refills | Status: DC

## 2020-04-08 NOTE — Patient Instructions (Addendum)
Medication Instructions:  1) Discontinue Imdur   2) Increase Torsemide to 40 mg twice a day   3) Increase Potassium to 20 meq twice a day   *If you need a refill on your cardiac medications before your next appointment, please call your pharmacy*   Lab Work: Your physician has requested that you have the following labs drawn today" BMET  Your physician recommends that you return for lab work in 1 week at the [place] office   If you have labs (blood work) drawn today and your tests are completely normal, you will receive your results only by: [name] MyChart Message (if you have MyChart) OR . A paper copy in the mail If you have any lab test that is abnormal or we need to change your treatment, we will call you to review the results.   Testing/Procedures: None ordered    Follow-Up: Keep follow up appointment with Dr. Burt Knack as scheduled   Other Instructions None

## 2020-04-08 NOTE — Progress Notes (Signed)
Cardiology Office Note:    Date:  04/08/2020   ID:  Samantha [name], DOB 09-18-1950, MRN [id number]  PCP:  [name], MD  [facility] Cardiologist:  Sherren Mocha, MD   [facility] Electrophysiologist:  None   Referring MD: [name], MD   Chief Complaint:  Follow-up (CHF)    Patient Profile:    Samantha [name] is a 70 y.o. female with:   Coronary artery disease s/p CABG in 2001 ? S/p multiple PCI procedures ? Chronic angina  Non-alcoholic cirrhosis ? Esophageal varices ? Thrombocytopenia (04/2018: PLT 58K)  Heart failure with reduced ejection fraction  ? EF 25-35 ? Admx in 3/19 with CHF >>followed with Dr. Haroldine Laws until 6/19 in [facility]  ? EF 40-45 in 10/2017 ? admx in 03/2018 with CHF, worsening anemia >> Tx with PRBCs ? Echo 04/2019: EF 50-55 ? Echocardiogram 11/21: EF 35-40   Diabetes mellitus  Atrial Flutter  Persistent AFib ? Not a candidate foranticoagulationdue to cirrhosis/varices  Accelerated idioventricular rhythm ? Beta-blocker DC'd >> restarted in 12/2019 due to reduced EF  SSS ? 6 sec post termination pause (during admit 12/21) >> beta-blocker DC'd; not felt to be PPM candidate   Hypertension   Hyperlipidemia  COPD, on chronic O2  Peripheral Arterial Disease  ? CT 04/2019: severe distal abdominal aorta and prox bilat CIA stenoses   Prior CV studies: Echocardiogram 01/24/20 EF 35-40, global HK, severe HK of inf and inf-lat wall, mildly reduced RVSF, severe BAE, mild to mod MR, mod TR, mild AV sclerosis (no AS)  Abd/Pelvic CTA 05/23/2019 High grade stenosis dist Abd Aorta prox to bifurcation; high grade stenosis in bilat proximal CIA Cirrhosis with stigmata of portal hypertension  Cardiomegaly Aortic atherosclerosis  Echocardiogram 05/20/2019 EF 50-55, no R WMA, mild LVH, low normal RV SF, RVSP 27.2, moderate LAE, mild RAE, mild MR, mild-moderate TR  Echo 11/15/2017 EF 40-45, inferior and lateral hypokinesis,  grade 2 diastolic dysfunction, mild LAE, trivial TR, PASP 37  Right heart catheterization 05/29/2017 Findings: RA = 11 RV = 51/12 PA = 51/13 (32) PCW = 18 (v = 25-30) Fick cardiac output/index = 9.7/5.0 Thermo CO/CI = 7.6/3.9 PVR = 1.8 WU FA sat = 96% PA sat = 73%, 76% SVC sat = 67% Assessment: 1. Mild to moderately elevated R-sided pressures 2. Minimally elevated wedge pressure with prominent v-waves likely due to diastolic dysfunction 3. High-cardiac output likely due to cirrhotic physiology  4. No evidence of significant intra-cardiac shunting  Echo 05/25/2017 Mild LVH, EF 50-55, MAC, mild MR, moderate LAE, mildly decreased RVSF, moderate RAE, moderate TR, PASP 54  Echo 01/25/17 Mild LVH, EF 30-35, inferior/inferolateral and anterolateral HK, MAC, mild LAE, moderately reduced RVSF, mild RAE, PASP 27  Cardiac catheterization 08/15/16 LAD D1 ostial 65 LCx ostial stent patent with 40 ISR RCA proximal 95 LIMA-OM 2 occluded SVG-AM. patent SVG-D1 patent  History of Present Illness:    Ms. [name] had been admitted several times in the last several months.  An echocardiogram in 12/2019 demonstrated worsening LVF again.  She was placed back on beta-blocker Rx.  She had 2 admissions in Dec 2021 with a/c CHF.  She had a post termination pause (6 sec) and her beta-blocker was stopped again.  She was off a lot of CHF meds due to low BP in the hospital.  Her diuretics have been adjusted for volume excess.  She was last seen 03/03/20.  Since last seen she has contacted our office with  symptoms of rapid palpitations.  As she had a prior 6 sec post termination pause, I gave her Metoprolol tartrate 12.5 mg to take only as needed for HR sustained > 120 bpm.  We have also increased her Torsemide for weight gain/volume excess.    She returns for f/u.  She is here with her daughter in law.  She has gained 11 lbs since her last visit.  She has worsening leg edema.  She sleeps on 2 pillows.  She has  had occasional chest pain and takes prn NTG.  She had to take Metoprolol once for high HRs.  She felt near syncopal when she took this. She has not had syncope.  She has a lot of leg cramps.  She sometimes has severe pain.  She did have some numbness in her face recently but no facial droop, speech difficulty or unilateral weakness.  She takes oxycodone and flexeril for pain.        Past Medical History:  Diagnosis Date  . Cataract    OU  . Chronic combined systolic and diastolic CHF (congestive heart failure) ([place])   . Cirrhosis of liver ([place])   . CKD (chronic kidney disease), stage III ([place])   . Coronary artery disease    a. s/p CABG 2001 w/ (LIMA-OM, SVG-D1, SVG-RCA). b. h/o multiple PCIs per Dr. Antionette Char note.  . Depression   . Esophageal varices ([place])    New 2013  . Gastroesophageal reflux disease   . History of pneumonia   . Hyperlipidemia   . Hypertension   . Hypertensive retinopathy    OU  . Obesity   . Osteoarthritis   . Pancytopenia ([place])   . Paroxysmal atrial flutter ([place])    a. dx 05/2016.  [name] Persistent atrial fibrillation ([place])    a. reported in hosp 07/2016, not on anticoag due to cirrhosis and liver disease, low platelets, varices.  . Thrombocytopenia (HCC)     Current Medications: Current Meds  Medication Sig  . albuterol (PROVENTIL HFA;VENTOLIN HFA) 108 (90 BASE) MCG/ACT inhaler Inhale 2 puffs into the lungs every 6 (six) hours as needed for wheezing or shortness of breath.   . cyclobenzaprine (FLEXERIL) 10 MG tablet Take 10 mg by mouth at bedtime.  [name] ezetimibe (ZETIA) 10 MG tablet Take 10 mg by mouth at bedtime.  [name] FLUoxetine (PROZAC) 20 MG capsule Take 20 mg by mouth daily.   [name] ipratropium (ATROVENT) 0.03 % nasal spray Place 1 spray into both nostrils daily as needed (allergies).   . metoprolol tartrate (LOPRESSOR) 25 MG tablet Take 0.5 tablets (12.5 mg total) by mouth daily as needed (palpitations). Only take if your heart rate is over 120 beats per minute.  [name]  NITROSTAT 0.4 MG SL tablet Place 0.4 mg under the tongue every 5 (five) minutes as needed for chest pain (x 3 tabs daily).   . ondansetron (ZOFRAN ODT) 4 MG disintegrating tablet Take 1 tablet (4 mg total) by mouth every 8 (eight) hours as needed for nausea or vomiting.  . Oxycodone HCl 10 MG TABS Take 10 mg by mouth daily.  . pantoprazole (PROTONIX) 40 MG tablet Take 40 mg by mouth daily.  . polyethylene glycol (MIRALAX / GLYCOLAX) 17 g packet Take 17 g by mouth daily.  . psyllium (METAMUCIL SMOOTH TEXTURE) 58.6 % powder Take 1 packet by mouth at bedtime.  [name] spironolactone (ALDACTONE) 25 MG tablet Take 25 mg by mouth daily.  . [DISCONTINUED] isosorbide mononitrate (IMDUR) 30 MG 24 hr  tablet Take 0.5 tablets (15 mg total) by mouth daily.  . [DISCONTINUED] potassium chloride (KLOR-CON) 10 MEQ tablet Take 2 tablets (20 mEq total) by mouth daily.  . [DISCONTINUED] torsemide (DEMADEX) 20 MG tablet Take 2 tablets (40 mg total) by mouth daily.     Allergies:   Entresto [sacubitril-valsartan], Vibra-tab [doxycycline], Acetaminophen, Ace inhibitors, Cefaclor, Cephalexin, Penicillins, Pregabalin, and Tape   Social History   Tobacco Use  . Smoking status: Former Smoker    Packs/day: 0.50    Years: 20.00    Pack years: 10.00    Types: Cigarettes    Quit date: 07/21/1995    Years since quitting: 24.7  . Smokeless tobacco: Never Used  Vaping Use  . Vaping Use: Never used  Substance Use Topics  . Alcohol use: No  . Drug use: No     Family Hx: The patient's family history includes Coronary artery disease in her sister; Diabetes in her brother, father, mother, and sister; Glaucoma in her sister; Heart attack in her mother; Heart attack (age of onset: 42) in her father. There is no history of Colon cancer.  ROS   EKGs/Labs/Other Test Reviewed:    EKG:  EKG is not ordered today.  The ekg ordered today demonstrates n/a  Recent Labs: 02/18/2020: Magnesium 2.2 02/19/2020: B Natriuretic Peptide  281.0 03/26/2020: BUN 11; Creatinine, Ser 1.17; Potassium 3.6; Sodium 134 03/30/2020: ALT 18; Hemoglobin 12.5; Platelets 63   Recent Lipid Panel Lab Results  Component Value Date/Time   CHOL 111 05/26/2017 06:37 AM   TRIG 93 05/26/2017 06:37 AM   HDL 38 (L) 05/26/2017 06:37 AM   CHOLHDL 2.9 05/26/2017 06:37 AM   LDLCALC 54 05/26/2017 06:37 AM      Risk Assessment/Calculations:    CHA2DS2-VASc Score = 6  This indicates a 9.7% annual risk of stroke. The patient's score is based upon: CHF History: Yes HTN History: Yes Diabetes History: Yes Stroke History: No Vascular Disease History: Yes Age Score: 1 Gender Score: 1     Physical Exam:    VS:  BP (!) 100/40   Pulse (!) 107   Ht _0  (1.575 m)   Wt 171 lb 3.2 oz (77.7 kg)   LMP 03/29/2011   SpO2 94%   BMI 31.31 kg/m     Wt Readings from Last 3 Encounters:  04/08/20 171 lb 3.2 oz (77.7 kg)  03/12/20 156 lb 11.2 oz (71.1 kg)  03/03/20 160 lb 6.4 oz (72.8 kg)     Constitutional:      Appearance: Healthy appearance. Not in distress.  Neck:     Vascular: No JVR.  Pulmonary:     Effort: Pulmonary effort is normal.     Breath sounds: No wheezing. No rales.  Cardiovascular:     Normal rate. Regular rhythm. Normal S1. Normal S2.     Murmurs: There is no murmur.  Edema:    Pretibial: bilateral 2+ edema of the pretibial area. Abdominal:     Palpations: Abdomen is soft.     Tenderness: There is no abdominal tenderness.  Skin:    General: Skin is warm and dry.  Neurological:     Mental Status: Alert and oriented to person, place and time.     Cranial Nerves: Cranial nerves are intact.       ASSESSMENT & PLAN:    1. Acute on chronic HFrEF (heart failure with reduced ejection fraction) (HCC) EF 35-40.  NYHA III.  She is volume overloaded again.  She  did better on Torsemide 40 mg twice daily.  I will increase her Torsemide back to 40 mg twice daily and K+ to 20 mEq twice daily.  BMET today and repeat in 1 week.   Her BP is running low and I do not think she can tolerate the Isosorbide.  DC Isosorbide.  F/u with Dr. Burt Knack next month as planned.  She knows to call if her weight is not going down (or increasing).  We could try a dose of metolazone if she is not improving.    2. Persistent atrial fibrillation ([place]) She is not a candidate for anticoagulation.  Rate control is difficult.  She has a hx of accelerated idioventricular rhythm and 6 sec post termination pause.  She cannot take regular AV nodal blocking agents.  I do not think there is anything that EP can offer her.  She is not a candidate for pacemaker.  We reviewed this today.  She can take metoprolol prn for HR sustained > 120.  She did have some numbness in her face recently.  But, no clear stroke like symptoms.  Her Plt count is 60K and her last INR was 1.5.  She is essentially auto-anticoagulated.  But, I did caution her that this is not a guarantee that she will not have a stroke.  Management of a stroke would be difficult given her high risk for bleeding related to her cirrhosis.    3. Coronary artery disease involving native coronary artery of native heart with angina pectoris ([place]) Hx of CABG and multiple PCI procedures.  Unfortunately, I do not think she can tolerate long acting nitrates due to low BP.  Continue prn NTG and ezetimibe.    4. Cirrhosis, non-alcoholic ([place]) F/u with GI as planned.  Her daughter in law questioned if she could have a blood clot in her legs. As noted, her plt count is low and her INR is mildly elevated.  We discussed the low likelihood for this.    Dispo:  No follow-ups on file.   Medication Adjustments/Labs and Tests Ordered: Current medicines are reviewed at length with the patient today.  Concerns regarding medicines are outlined above.  Tests Ordered: Orders Placed This Encounter  Procedures  . Basic metabolic panel  . Basic metabolic panel   Medication Changes: Meds ordered this encounter  Medications   . DISCONTD: potassium chloride (KLOR-CON) 10 MEQ tablet    Sig: Take 2 tablets (20 mEq total) by mouth in the morning and at bedtime.    Dispense:  180 tablet    Refill:  3  . torsemide 40 MG TABS    Sig: Take 40 mg by mouth 2 (two) times daily.    Dispense:  180 tablet    Refill:  3  . potassium chloride 20 MEQ TBCR    Sig: Take 20 mEq by mouth in the morning and at bedtime.    Dispense:  180 tablet    Refill:  3    Signed, Richardson Dopp, PA-C  04/08/2020 3:18 PM    [facility] [place], [place], [place]  [address] Phone: [phone]; Fax: [phone]

## 2020-04-09 ENCOUNTER — Other Ambulatory Visit: Payer: Self-pay | Admitting: Nurse Practitioner

## 2020-04-09 LAB — BASIC METABOLIC PANEL
BUN/Creatinine Ratio: 12 (ref 12–28)
BUN: 12 mg/dL (ref 8–27)
CO2: 23 mmol/L (ref 20–29)
Calcium: 8.8 mg/dL (ref 8.7–10.3)
Chloride: 99 mmol/L (ref 96–106)
Creatinine, Ser: 1 mg/dL (ref 0.57–1.00)
GFR calc Af Amer: 65 mL/min/{1.73_m2} (ref >59)
GFR calc non Af Amer: 57 mL/min/{1.73_m2} — ABNORMAL LOW (ref >59)
Glucose: 93 mg/dL (ref 65–99)
Potassium: 4.6 mmol/L (ref 3.5–5.2)
Sodium: 138 mmol/L (ref 134–144)

## 2020-04-09 MED ORDER — POTASSIUM CHLORIDE CRYS ER 20 MEQ PO TBCR: 20.0000 meq | EXTENDED_RELEASE_TABLET | Freq: Two times a day (BID) | ORAL | 3 refills | Status: DC

## 2020-04-12 ENCOUNTER — Other Ambulatory Visit: Payer: Self-pay

## 2020-04-12 ENCOUNTER — Inpatient Hospital Stay (HOSPITAL_COMMUNITY)
Admission: EM | Admit: 2020-04-12 | Discharge: 2020-04-15 | DRG: 552 | Disposition: A | Discharge status: Skilled Nursing Facility | Payer: Medicare Other | Attending: Family Medicine | Admitting: Family Medicine

## 2020-04-12 ENCOUNTER — Encounter (HOSPITAL_COMMUNITY): Payer: Self-pay | Admitting: Emergency Medicine

## 2020-04-12 ENCOUNTER — Emergency Department (HOSPITAL_COMMUNITY): Payer: Medicare Other

## 2020-04-12 DIAGNOSIS — M545 Low back pain, unspecified: Secondary | ICD-10-CM | POA: Diagnosis not present

## 2020-04-12 DIAGNOSIS — J9611 Chronic respiratory failure with hypoxia: Secondary | ICD-10-CM | POA: Diagnosis not present

## 2020-04-12 DIAGNOSIS — G8929 Other chronic pain: Secondary | ICD-10-CM | POA: Diagnosis present

## 2020-04-12 DIAGNOSIS — E871 Hypo-osmolality and hyponatremia: Secondary | ICD-10-CM | POA: Diagnosis present

## 2020-04-12 DIAGNOSIS — Z79899 Other long term (current) drug therapy: Secondary | ICD-10-CM

## 2020-04-12 DIAGNOSIS — I5042 Chronic combined systolic (congestive) and diastolic (congestive) heart failure: Secondary | ICD-10-CM | POA: Diagnosis not present

## 2020-04-12 DIAGNOSIS — E785 Hyperlipidemia, unspecified: Secondary | ICD-10-CM | POA: Diagnosis not present

## 2020-04-12 DIAGNOSIS — R000 Tachycardia, unspecified: Secondary | ICD-10-CM | POA: Diagnosis not present

## 2020-04-12 DIAGNOSIS — Z743 Need for continuous supervision: Secondary | ICD-10-CM | POA: Diagnosis not present

## 2020-04-12 DIAGNOSIS — M549 Dorsalgia, unspecified: Secondary | ICD-10-CM | POA: Diagnosis not present

## 2020-04-12 DIAGNOSIS — K7469 Other cirrhosis of liver: Secondary | ICD-10-CM | POA: Diagnosis not present

## 2020-04-12 DIAGNOSIS — S32000A Wedge compression fracture of unspecified lumbar vertebra, initial encounter for closed fracture: Secondary | ICD-10-CM | POA: Diagnosis present

## 2020-04-12 DIAGNOSIS — N1831 Chronic kidney disease, stage 3a: Secondary | ICD-10-CM | POA: Diagnosis not present

## 2020-04-12 DIAGNOSIS — Y9301 Activity, walking, marching and hiking: Secondary | ICD-10-CM | POA: Diagnosis present

## 2020-04-12 DIAGNOSIS — Z951 Presence of aortocoronary bypass graft: Secondary | ICD-10-CM

## 2020-04-12 DIAGNOSIS — R6889 Other general symptoms and signs: Secondary | ICD-10-CM | POA: Diagnosis not present

## 2020-04-12 DIAGNOSIS — I130 Hypertensive heart and chronic kidney disease with heart failure and stage 1 through stage 4 chronic kidney disease, or unspecified chronic kidney disease: Secondary | ICD-10-CM | POA: Diagnosis not present

## 2020-04-12 DIAGNOSIS — I4819 Other persistent atrial fibrillation: Secondary | ICD-10-CM | POA: Diagnosis present

## 2020-04-12 DIAGNOSIS — K766 Portal hypertension: Secondary | ICD-10-CM | POA: Diagnosis present

## 2020-04-12 DIAGNOSIS — M1612 Unilateral primary osteoarthritis, left hip: Secondary | ICD-10-CM | POA: Diagnosis not present

## 2020-04-12 DIAGNOSIS — Z8719 Personal history of other diseases of the digestive system: Secondary | ICD-10-CM

## 2020-04-12 DIAGNOSIS — I4892 Unspecified atrial flutter: Secondary | ICD-10-CM | POA: Diagnosis present

## 2020-04-12 DIAGNOSIS — Z8701 Personal history of pneumonia (recurrent): Secondary | ICD-10-CM

## 2020-04-12 DIAGNOSIS — K5900 Constipation, unspecified: Secondary | ICD-10-CM | POA: Diagnosis not present

## 2020-04-12 DIAGNOSIS — D61818 Other pancytopenia: Secondary | ICD-10-CM | POA: Diagnosis not present

## 2020-04-12 DIAGNOSIS — F32A Depression, unspecified: Secondary | ICD-10-CM | POA: Diagnosis present

## 2020-04-12 DIAGNOSIS — W010XXA Fall on same level from slipping, tripping and stumbling without subsequent striking against object, initial encounter: Secondary | ICD-10-CM | POA: Diagnosis present

## 2020-04-12 DIAGNOSIS — Z961 Presence of intraocular lens: Secondary | ICD-10-CM | POA: Diagnosis present

## 2020-04-12 DIAGNOSIS — E782 Mixed hyperlipidemia: Secondary | ICD-10-CM | POA: Diagnosis not present

## 2020-04-12 DIAGNOSIS — M76892 Other specified enthesopathies of left lower limb, excluding foot: Secondary | ICD-10-CM | POA: Diagnosis not present

## 2020-04-12 DIAGNOSIS — I255 Ischemic cardiomyopathy: Secondary | ICD-10-CM | POA: Diagnosis not present

## 2020-04-12 DIAGNOSIS — Z90710 Acquired absence of both cervix and uterus: Secondary | ICD-10-CM

## 2020-04-12 DIAGNOSIS — Z87891 Personal history of nicotine dependence: Secondary | ICD-10-CM

## 2020-04-12 DIAGNOSIS — I2510 Atherosclerotic heart disease of native coronary artery without angina pectoris: Secondary | ICD-10-CM | POA: Diagnosis present

## 2020-04-12 DIAGNOSIS — R262 Difficulty in walking, not elsewhere classified: Secondary | ICD-10-CM | POA: Diagnosis not present

## 2020-04-12 DIAGNOSIS — I480 Paroxysmal atrial fibrillation: Secondary | ICD-10-CM | POA: Diagnosis not present

## 2020-04-12 DIAGNOSIS — R531 Weakness: Secondary | ICD-10-CM | POA: Diagnosis not present

## 2020-04-12 DIAGNOSIS — Z66 Do not resuscitate: Secondary | ICD-10-CM | POA: Diagnosis not present

## 2020-04-12 DIAGNOSIS — Z20822 Contact with and (suspected) exposure to covid-19: Secondary | ICD-10-CM | POA: Diagnosis not present

## 2020-04-12 DIAGNOSIS — M6281 Muscle weakness (generalized): Secondary | ICD-10-CM | POA: Diagnosis not present

## 2020-04-12 DIAGNOSIS — W19XXXA Unspecified fall, initial encounter: Secondary | ICD-10-CM | POA: Diagnosis not present

## 2020-04-12 DIAGNOSIS — Z8249 Family history of ischemic heart disease and other diseases of the circulatory system: Secondary | ICD-10-CM

## 2020-04-12 DIAGNOSIS — I10 Essential (primary) hypertension: Secondary | ICD-10-CM | POA: Diagnosis not present

## 2020-04-12 DIAGNOSIS — S32020A Wedge compression fracture of second lumbar vertebra, initial encounter for closed fracture: Principal | ICD-10-CM | POA: Diagnosis present

## 2020-04-12 DIAGNOSIS — R296 Repeated falls: Secondary | ICD-10-CM | POA: Diagnosis present

## 2020-04-12 DIAGNOSIS — M4722 Other spondylosis with radiculopathy, cervical region: Secondary | ICD-10-CM | POA: Diagnosis not present

## 2020-04-12 DIAGNOSIS — K7460 Unspecified cirrhosis of liver: Secondary | ICD-10-CM | POA: Diagnosis present

## 2020-04-12 DIAGNOSIS — S32020G Wedge compression fracture of second lumbar vertebra, subsequent encounter for fracture with delayed healing: Secondary | ICD-10-CM | POA: Diagnosis not present

## 2020-04-12 DIAGNOSIS — R279 Unspecified lack of coordination: Secondary | ICD-10-CM | POA: Diagnosis not present

## 2020-04-12 DIAGNOSIS — Z9109 Other allergy status, other than to drugs and biological substances: Secondary | ICD-10-CM

## 2020-04-12 DIAGNOSIS — R9431 Abnormal electrocardiogram [ECG] [EKG]: Secondary | ICD-10-CM | POA: Diagnosis not present

## 2020-04-12 DIAGNOSIS — Z9842 Cataract extraction status, left eye: Secondary | ICD-10-CM

## 2020-04-12 DIAGNOSIS — Z9841 Cataract extraction status, right eye: Secondary | ICD-10-CM

## 2020-04-12 DIAGNOSIS — Z880 Allergy status to penicillin: Secondary | ICD-10-CM

## 2020-04-12 DIAGNOSIS — K219 Gastro-esophageal reflux disease without esophagitis: Secondary | ICD-10-CM | POA: Diagnosis present

## 2020-04-12 DIAGNOSIS — Z881 Allergy status to other antibiotic agents status: Secondary | ICD-10-CM

## 2020-04-12 DIAGNOSIS — Z888 Allergy status to other drugs, medicaments and biological substances status: Secondary | ICD-10-CM

## 2020-04-12 DIAGNOSIS — F419 Anxiety disorder, unspecified: Secondary | ICD-10-CM | POA: Diagnosis present

## 2020-04-12 DIAGNOSIS — R269 Unspecified abnormalities of gait and mobility: Secondary | ICD-10-CM | POA: Diagnosis present

## 2020-04-12 DIAGNOSIS — Z79891 Long term (current) use of opiate analgesic: Secondary | ICD-10-CM

## 2020-04-12 DIAGNOSIS — Z886 Allergy status to analgesic agent status: Secondary | ICD-10-CM

## 2020-04-12 DIAGNOSIS — S3993XA Unspecified injury of pelvis, initial encounter: Secondary | ICD-10-CM | POA: Diagnosis not present

## 2020-04-12 DIAGNOSIS — S72002A Fracture of unspecified part of neck of left femur, initial encounter for closed fracture: Secondary | ICD-10-CM | POA: Diagnosis not present

## 2020-04-12 LAB — CBC WITH DIFFERENTIAL/PLATELET
Abs Immature Granulocytes: 0.02 10*3/uL (ref 0.00–0.07)
Basophils Absolute: 0 10*3/uL (ref 0.0–0.1)
Basophils Relative: 1 %
Eosinophils Absolute: 0.1 10*3/uL (ref 0.0–0.5)
Eosinophils Relative: 2 %
HCT: 38.1 % (ref 36.0–46.0)
Hemoglobin: 12.1 g/dL (ref 12.0–15.0)
Immature Granulocytes: 1 %
Lymphocytes Relative: 14 %
Lymphs Abs: 0.5 10*3/uL — ABNORMAL LOW (ref 0.7–4.0)
MCH: 30.8 pg (ref 26.0–34.0)
MCHC: 31.8 g/dL (ref 30.0–36.0)
MCV: 96.9 fL (ref 80.0–100.0)
Monocytes Absolute: 0.2 10*3/uL (ref 0.1–1.0)
Monocytes Relative: 6 %
Neutro Abs: 2.6 10*3/uL (ref 1.7–7.7)
Neutrophils Relative %: 76 %
Platelets: 52 10*3/uL — ABNORMAL LOW (ref 150–400)
RBC: 3.93 MIL/uL (ref 3.87–5.11)
RDW: 16.4 % — ABNORMAL HIGH (ref 11.5–15.5)
WBC: 3.5 10*3/uL — ABNORMAL LOW (ref 4.0–10.5)
nRBC: 0 % (ref 0.0–0.2)

## 2020-04-12 LAB — COMPREHENSIVE METABOLIC PANEL
ALT: 25 U/L (ref 0–44)
AST: 60 U/L — ABNORMAL HIGH (ref 15–41)
Albumin: 2.8 g/dL — ABNORMAL LOW (ref 3.5–5.0)
Alkaline Phosphatase: 88 U/L (ref 38–126)
Anion gap: 9 (ref 5–15)
BUN: 12 mg/dL (ref 8–23)
CO2: 25 mmol/L (ref 22–32)
Calcium: 8.9 mg/dL (ref 8.9–10.3)
Chloride: 99 mmol/L (ref 98–111)
Creatinine, Ser: 1.06 mg/dL — ABNORMAL HIGH (ref 0.44–1.00)
GFR, Estimated: 56 mL/min — ABNORMAL LOW (ref >60)
Glucose, Bld: 98 mg/dL (ref 70–99)
Potassium: 4.2 mmol/L (ref 3.5–5.1)
Sodium: 133 mmol/L — ABNORMAL LOW (ref 135–145)
Total Bilirubin: 5.9 mg/dL — ABNORMAL HIGH (ref 0.3–1.2)
Total Protein: 6.6 g/dL (ref 6.5–8.1)

## 2020-04-12 LAB — URINALYSIS, ROUTINE W REFLEX MICROSCOPIC
Bilirubin Urine: NEGATIVE
Glucose, UA: NEGATIVE mg/dL
Hgb urine dipstick: NEGATIVE
Ketones, ur: NEGATIVE mg/dL
Leukocytes,Ua: NEGATIVE
Nitrite: NEGATIVE
Protein, ur: NEGATIVE mg/dL
Specific Gravity, Urine: 1.015 (ref 1.005–1.030)
pH: 6 (ref 5.0–8.0)

## 2020-04-12 LAB — TROPONIN I (HIGH SENSITIVITY)
Troponin I (High Sensitivity): 27 ng/L — ABNORMAL HIGH (ref <18)
Troponin I (High Sensitivity): 28 ng/L — ABNORMAL HIGH (ref <18)
Troponin I (High Sensitivity): 29 ng/L — ABNORMAL HIGH (ref <18)

## 2020-04-12 LAB — D-DIMER, QUANTITATIVE: D-Dimer, Quant: 9.23 ug/mL-FEU — ABNORMAL HIGH (ref 0.00–0.50)

## 2020-04-12 MED ORDER — METOPROLOL TARTRATE 25 MG PO TABS
12.5000 mg | ORAL_TABLET | Freq: Two times a day (BID) | ORAL | Status: DC
  Administered 2020-04-13 – 2020-04-15 (×3): 12.5 mg via ORAL
  Filled 2020-04-12 (×4): qty 1

## 2020-04-12 MED ORDER — HYDROMORPHONE HCL 1 MG/ML IJ SOLN
0.5000 mg | INTRAMUSCULAR | Status: DC | PRN
  Administered 2020-04-12 – 2020-04-15 (×10): 0.5 mg via INTRAVENOUS
  Filled 2020-04-12 (×3): qty 0.5
  Filled 2020-04-12 (×2): qty 1
  Filled 2020-04-12 (×5): qty 0.5

## 2020-04-12 MED ORDER — FLUOXETINE HCL 20 MG PO CAPS
20.0000 mg | ORAL_CAPSULE | Freq: Every day | ORAL | Status: DC
  Administered 2020-04-12 – 2020-04-15 (×4): 20 mg via ORAL
  Filled 2020-04-12 (×4): qty 1

## 2020-04-12 MED ORDER — NITROGLYCERIN 0.4 MG/SPRAY TL SOLN
1.0000 | Status: DC | PRN
  Filled 2020-04-12: qty 4.9

## 2020-04-12 MED ORDER — LIDOCAINE 5 % EX PTCH
1.0000 | MEDICATED_PATCH | CUTANEOUS | Status: DC
  Administered 2020-04-12 – 2020-04-15 (×4): 1 via TRANSDERMAL
  Filled 2020-04-12 (×4): qty 1

## 2020-04-12 MED ORDER — LABETALOL HCL 5 MG/ML IV SOLN: 0.5000 mg/min | Status: DC

## 2020-04-12 MED ORDER — CYCLOBENZAPRINE HCL 10 MG PO TABS: 10.0000 mg | ORAL_TABLET | Freq: Three times a day (TID) | ORAL | Status: DC | PRN

## 2020-04-12 MED ORDER — OXYCODONE HCL 5 MG PO TABS
5.0000 mg | ORAL_TABLET | ORAL | Status: DC | PRN
  Administered 2020-04-12: 10 mg via ORAL
  Administered 2020-04-13 – 2020-04-14 (×2): 5 mg via ORAL
  Administered 2020-04-15: 10 mg via ORAL
  Administered 2020-04-15: 5 mg via ORAL
  Filled 2020-04-12: qty 1
  Filled 2020-04-12 (×2): qty 2
  Filled 2020-04-12 (×2): qty 1

## 2020-04-12 MED ORDER — ALBUTEROL SULFATE HFA 108 (90 BASE) MCG/ACT IN AERS: 2.0000 | INHALATION_SPRAY | Freq: Four times a day (QID) | RESPIRATORY_TRACT | Status: DC | PRN

## 2020-04-12 MED ORDER — FENTANYL CITRATE (PF) 100 MCG/2ML IJ SOLN: 100.0000 ug | INTRAMUSCULAR | Status: DC | PRN

## 2020-04-12 MED ORDER — SODIUM CHLORIDE 0.9 % IV SOLN: INTRAVENOUS | Status: DC

## 2020-04-12 MED ORDER — OXYCODONE-ACETAMINOPHEN 5-325 MG PO TABS
1.0000 | ORAL_TABLET | Freq: Once | ORAL | Status: AC
  Administered 2020-04-12: 1 via ORAL
  Filled 2020-04-12: qty 1

## 2020-04-12 MED ORDER — EZETIMIBE 10 MG PO TABS
10.0000 mg | ORAL_TABLET | Freq: Every day | ORAL | Status: DC
  Administered 2020-04-12 – 2020-04-14 (×3): 10 mg via ORAL
  Filled 2020-04-12 (×3): qty 1

## 2020-04-12 MED ORDER — SPIRONOLACTONE 25 MG PO TABS
25.0000 mg | ORAL_TABLET | Freq: Every day | ORAL | Status: DC
  Administered 2020-04-13 – 2020-04-15 (×3): 25 mg via ORAL
  Filled 2020-04-12 (×3): qty 1

## 2020-04-12 NOTE — ED Notes (Signed)
Pt called out c/o left sided chest pain radiating down left arm. Pt describes the pain as dull and 9/10. Pt also reports SOB and nausea. EKG done and taken to Dr. Eulis Foster. MD made aware of pt's symptoms.

## 2020-04-12 NOTE — ED Provider Notes (Signed)
[facility] Provider Note   CSN: [id number] Arrival date & time: 04/12/20  1106     History Chief Complaint  Patient presents with  . Fall    Samantha [name] is a 70 y.o. female.  HPI She presents for evaluation of fall this morning.  She states she was sitting, got up to walk with her walker, felt like her right leg was asleep and then fell.  She is unable to get up without assistance.  She presents for evaluation by EMS.  She has pain in her low back.  She denies hip pain.  The numbness in her right leg has resolved.  She denies injury to head or neck.  No loss of consciousness following the fall.  She feels like the pain radiates from her back through to her abdomen.  She denies vomiting, dizziness, recent anorexia, chest pain or shortness of breath.  There are no other known modifying factors.    Past Medical History:  Diagnosis Date  . Cataract    OU  . Chronic combined systolic and diastolic CHF (congestive heart failure) ([place])   . Cirrhosis of liver ([place])   . CKD (chronic kidney disease), stage III ([place])   . Coronary artery disease    a. s/p CABG 2001 w/ (LIMA-OM, SVG-D1, SVG-RCA). b. h/o multiple PCIs per Dr. Antionette Char note.  . Depression   . Esophageal varices ([place])    New 2013  . Gastroesophageal reflux disease   . History of pneumonia   . Hyperlipidemia   . Hypertension   . Hypertensive retinopathy    OU  . Obesity   . Osteoarthritis   . Pancytopenia ([place])   . Paroxysmal atrial flutter ([place])    a. dx 05/2016.  [name] Persistent atrial fibrillation ([place])    a. reported in hosp 07/2016, not on anticoag due to cirrhosis and liver disease, low platelets, varices.  . Thrombocytopenia [facility])     Patient Active Problem List   Diagnosis Date Noted  . Cholestasis, intrahepatic 03/12/2020  . Lobar pneumonia ([place]) 02/18/2020  . Hepatic cirrhosis ([place])   . Hypokalemia   . Anasarca 02/14/2020  . Cirrhosis of liver with ascites ([place]) 02/14/2020  .  Volume overload 02/13/2020  . Constipation 02/11/2020  . Peripheral edema 02/03/2020  . Thrombocytopenia ([place]) 01/24/2020  . Left leg swelling 01/24/2020  . Hyperammonemia ([place]) 01/24/2020  . Prolonged QT interval 01/24/2020  . Elevated troponin 01/24/2020  . Intractable nausea and vomiting 01/23/2020  . Atypical chest pain 08/06/2019  . GERD (gastroesophageal reflux disease) 08/06/2019  . Acute respiratory failure with hypoxia ([place]) 05/23/2019  . Abdominal pain 05/20/2019  . Intractable abdominal pain 05/19/2019  . Paroxysmal atrial fibrillation ([place]) 05/19/2019  . Anxiety with depression 05/19/2019  . Sinus arrhythmia 05/19/2019  . Iron deficiency anemia 02/05/2019  . Chronic combined systolic and diastolic CHF (congestive heart failure) ([place]) 04/17/2018  . Heart failure ([place]) 04/17/2018  . Osteoarthritis of spine with radiculopathy, cervical region 01/12/2018  . Paresthesia of both hands 12/14/2017  . Recurrent falls 12/14/2017  . Closed compression fracture of first lumbar vertebra ([place]) 08/23/2017  . Lumbar spondylosis with myelopathy 08/23/2017  . Carpal tunnel syndrome of right wrist 04/05/2017  . Rectal pain 03/06/2017  . Influenza A (H1N1) 02/22/2017  . Rectal bleeding 02/22/2017  . Closed fracture of lower end of right radius with routine healing 02/20/2017  . Injury of triangular fibrocartilage complex of right wrist 02/20/2017  . Pain 02/10/2017  . Other  cirrhosis of liver ([place]) 10/26/2016  . Persistent atrial fibrillation 08/16/2016  . Cardiomyopathy, ischemic-35-40% by cath  01/11/2015  . Obesity 09/24/2013  . Unspecified hereditary and idiopathic peripheral neuropathy 01/10/2013  . Hereditary and idiopathic peripheral neuropathy 01/10/2013  . Cirrhosis, non-alcoholic ([place]) 93/57/0177  . Esophageal varices- Plavix stopped 12/23/2011  . GI bleed 12/23/2011  . Idiopathic esophageal varices without bleeding ([place]) 12/23/2011  . Anemia 04/22/2011  . CAD (coronary  artery disease) 08/21/2009  . Mixed hyperlipidemia 06/04/2008  . Essential hypertension 06/04/2008  . S/P CABG x 3- 2001 06/04/2008    Past Surgical History:  Procedure Laterality Date  . ABDOMINAL HYSTERECTOMY    . BACK SURGERY    . CARDIAC CATHETERIZATION  2004   left internal mammary artery to the obtuse marginal  was found to be small and thread like.  The two grafts were patent.  The left circumflex had 90% in-stent restenosis and cutting balloon angioplasty was performed followed by placement of a 3.0 x 10m Taxus drug -eluting stent.    .Marland KitchenCARDIAC CATHETERIZATION  2006   There was in-stent restenosis in the left circumflex and this was treated with cutting balloon angioplasty   . CARDIAC CATHETERIZATION  2008   vein graft to the to the obtuse marginal was patent, although small, left circumflex had 40% in-stent restenosis, ejection fraction 40-45%.  The patient was medically mananged.  .Marland KitchenCARDIAC CATHETERIZATION N/A 01/09/2015   Procedure: Left Heart Cath and Cors/Grafts Angiography;  Surgeon: HBelva Crome MD;  [facility];  Service: Cardiovascular;  Laterality: N/A;  . CATARACT EXTRACTION W/PHACO Right 05/17/2019   Procedure: CATARACT EXTRACTION PHACO AND INTRAOCULAR LENS PLACEMENT (IOC) (CDE: 14.99);  Surgeon: WBaruch Goldmann MD;  Location: [facility];  Service: Ophthalmology;  Laterality: Right;  . CATARACT EXTRACTION W/PHACO Left 06/10/2019   Procedure: CATARACT EXTRACTION PHACO AND INTRAOCULAR LENS PLACEMENT (IOC);  Surgeon: WBaruch Goldmann MD;  Location: [facility] OR[facility];  Service: Ophthalmology;  Laterality: Left;  CDE: 11.96  . cataract sx    . COLONOSCOPY  03/29/2011   Procedure: COLONOSCOPY;  Surgeon: MJamesetta So MD;  Location: [facility];  Service: Gastroenterology;  Laterality: N/A;  . CORONARY ARTERY BYPASS GRAFT  May 31,2001   x 3 with a vein graft to the first diagonal, vein graft to the right coronary  artery, and a free left internal mammary  artery to the  obtuse marginal   . ESOPHAGEAL BANDING N/A 07/04/2012   Procedure: ESOPHAGEAL BANDING;  Surgeon: NRogene Houston MD;  Location: [facility];  Service: Endoscopy;  Laterality: N/A;  . ESOPHAGEAL BANDING N/A 09/17/2012   Procedure: ESOPHAGEAL BANDING;  Surgeon: NRogene Houston MD;  [facility];  Service: Endoscopy;  Laterality: N/A;  . ESOPHAGEAL BANDING N/A 10/22/2013   Procedure: ESOPHAGEAL BANDING;  Surgeon: NRogene Houston MD;  [facility];  Service: Endoscopy;  Laterality: N/A;  . ESOPHAGEAL BANDING N/A 11/28/2014   Procedure: ESOPHAGEAL BANDING;  Surgeon: NRogene Houston MD;  [facility];  Service: Endoscopy;  Laterality: N/A;  . ESOPHAGEAL BANDING N/A 10/29/2015   Procedure: ESOPHAGEAL BANDING;  Surgeon: NRogene Houston MD;  [facility];  Service: Endoscopy;  Laterality: N/A;  . ESOPHAGOGASTRODUODENOSCOPY  12/20/2011   Procedure: ESOPHAGOGASTRODUODENOSCOPY (EGD);  Surgeon: MJamesetta So MD;  [facility];  Service: Gastroenterology;  Laterality: N/A;  . ESOPHAGOGASTRODUODENOSCOPY N/A 07/04/2012   Procedure: ESOPHAGOGASTRODUODENOSCOPY (EGD);  Surgeon: NRogene Houston MD;  [facility];  Service: Endoscopy;  Laterality: N/A;  235-moved to 255 Ann to notify pt  . ESOPHAGOGASTRODUODENOSCOPY N/A 09/17/2012   Procedure: ESOPHAGOGASTRODUODENOSCOPY (EGD);  Surgeon: Rogene Houston, MD;  [facility];  Service: Endoscopy;  Laterality: N/A;  730  . ESOPHAGOGASTRODUODENOSCOPY N/A 10/22/2013   Procedure: ESOPHAGOGASTRODUODENOSCOPY (EGD);  Surgeon: Rogene Houston, MD;  [facility];  Service: Endoscopy;  Laterality: N/A;  730  . ESOPHAGOGASTRODUODENOSCOPY N/A 11/28/2014   Procedure: ESOPHAGOGASTRODUODENOSCOPY (EGD);  Surgeon: Rogene Houston, MD;  [facility];  Service: Endoscopy;  Laterality: N/A;  1:25  . ESOPHAGOGASTRODUODENOSCOPY N/A 10/29/2015   Procedure: ESOPHAGOGASTRODUODENOSCOPY (EGD);   Surgeon: Rogene Houston, MD;  [facility];  Service: Endoscopy;  Laterality: N/A;  12:00  . ESOPHAGOGASTRODUODENOSCOPY N/A 12/12/2016   Grade 1 and 2 varices in lower third of esophagus, post variceal banding scar distal esophagus, mild portal hypertensive gastropathy, Type 1 gastroesophageal varices without bleeding, normal duodenum and second portion of duodenum.   [name] FLEXIBLE SIGMOIDOSCOPY N/A 03/20/2017   external hemorrhoids  . JOINT REPLACEMENT    . LEFT HEART CATH AND CORS/GRAFTS ANGIOGRAPHY N/A 08/15/2016   Procedure: Left Heart Cath and Cors/Grafts Angiography;  Surgeon: Jettie Booze, MD;  Location: [facility] [facility];  Service: Cardiovascular;  Laterality: N/A;  . LEFT HEART CATHETERIZATION WITH CORONARY/GRAFT ANGIOGRAM N/A 12/25/2013   Procedure: LEFT HEART CATHETERIZATION WITH Beatrix Fetters;  Surgeon: Blane Ohara, MD;  Location: [facility] CATH LAB;  Service: Cardiovascular;  Laterality: N/A;  . RIGHT HEART CATH N/A 05/29/2017   Procedure: RIGHT HEART CATH;  Surgeon: Jolaine Artist, MD;  Location: [facility] CV LAB;  Service: Cardiovascular;  Laterality: N/A;  . rotator cuff left  2007  . TONSILLECTOMY       OB History   No obstetric history on file.     Family History  Problem Relation Age of Onset  . Heart attack Mother   . Diabetes Mother   . Heart attack Father 53       cause of death  . Diabetes Father   . Coronary artery disease Sister        CABG   . Diabetes Sister   . Glaucoma Sister   . Diabetes Brother   . Colon cancer Neg Hx     Social History   Tobacco Use  . Smoking status: Former Smoker    Packs/day: 0.50    Years: 20.00    Pack years: 10.00    Types: Cigarettes    Quit date: 07/21/1995    Years since quitting: 24.7  . Smokeless tobacco: Never Used  Vaping Use  . Vaping Use: Never used  Substance Use Topics  . Alcohol use: No  . Drug use: No    Home Medications Prior to Admission medications   Medication  Sig Start Date End Date Taking? Authorizing Provider  albuterol (PROVENTIL HFA;VENTOLIN HFA) 108 (90 BASE) MCG/ACT inhaler Inhale 2 puffs into the lungs every 6 (six) hours as needed for wheezing or shortness of breath.     [provider]  cyclobenzaprine (FLEXERIL) 10 MG tablet Take 10 mg by mouth at bedtime.    [provider]  ezetimibe (ZETIA) 10 MG tablet Take 10 mg by mouth at bedtime.    [provider]  FLUoxetine (PROZAC) 20 MG capsule Take 20 mg by mouth daily.     [provider]  ipratropium (ATROVENT) 0.03 % nasal spray Place  1 spray into both nostrils daily as needed (allergies).  12/23/19   [provider]  metoprolol tartrate (LOPRESSOR) 25 MG tablet Take 0.5 tablets (12.5 mg total) by mouth daily as needed (palpitations). Only take if your heart rate is over 120 beats per minute. 03/31/20   Kathlen Mody, Scott T, PA-C  NITROSTAT 0.4 MG SL tablet Place 0.4 mg under the tongue every 5 (five) minutes as needed for chest pain (x 3 tabs daily).  11/15/13   [provider]  ondansetron (ZOFRAN ODT) 4 MG disintegrating tablet Take 1 tablet (4 mg total) by mouth every 8 (eight) hours as needed for nausea or vomiting. 02/04/20   Manuella Ghazi, Pratik D, DO  Oxycodone HCl 10 MG TABS Take 10 mg by mouth daily.    [provider]  pantoprazole (PROTONIX) 40 MG tablet Take 40 mg by mouth daily.    [provider]  polyethylene glycol (MIRALAX / GLYCOLAX) 17 g packet Take 17 g by mouth daily. 03/12/20   Rehman, Mechele Dawley, MD  potassium chloride SA (KLOR-CON) 20 MEQ tablet Take 1 tablet (20 mEq total) by mouth 2 (two) times daily. 04/09/20   Richardson Dopp T, PA-C  psyllium (METAMUCIL SMOOTH TEXTURE) 58.6 % powder Take 1 packet by mouth at bedtime. 02/11/20   Rogene Houston, MD  spironolactone (ALDACTONE) 25 MG tablet Take 25 mg by mouth daily.    [provider]    Allergies    Entresto [sacubitril-valsartan], Vibra-tab [doxycycline],  Acetaminophen, Ace inhibitors, Cefaclor, Cephalexin, Penicillins, Pregabalin, and Tape  Review of Systems   Review of Systems  All other systems reviewed and are negative.   Physical Exam Updated Vital Signs BP (!) 104/50   Pulse (!) 109   Temp 98.1 F (36.7 C)   Resp 17   Ht 5' 2"  (1.575 m)   Wt 74.8 kg   LMP 03/29/2011   SpO2 98%   BMI 30.18 kg/m   Physical Exam Vitals and nursing note reviewed.  Constitutional:      General: She is not in acute distress.    Appearance: She is well-developed and well-nourished. She is not ill-appearing, toxic-appearing or diaphoretic.  HENT:     Head: Normocephalic and atraumatic.     Right Ear: External ear normal.     Left Ear: External ear normal.  Eyes:     Extraocular Movements: EOM normal.     Conjunctiva/sclera: Conjunctivae normal.     Pupils: Pupils are equal, round, and reactive to light.  Neck:     Trachea: Phonation normal.  Cardiovascular:     Rate and Rhythm: Normal rate.  Pulmonary:     Effort: Pulmonary effort is normal.  Chest:     Chest wall: No bony tenderness.  Abdominal:     General: There is no distension.     Palpations: Abdomen is soft.     Tenderness: There is no abdominal tenderness.  Musculoskeletal:        General: No swelling or tenderness. Normal range of motion.     Cervical back: Normal range of motion and neck supple.     Comments: Tender lumbar and pain lumbar with movement of legs.  Able to move both legs by lifting off stretcher, and no tenderness of the hips to palpation or with rotation passively.  Skin:    General: Skin is warm, dry and intact.  Neurological:     Mental Status: She is alert and oriented to person, place, and time.  Cranial Nerves: No cranial nerve deficit.     Sensory: No sensory deficit.     Motor: No abnormal muscle tone.     Coordination: Coordination normal.  Psychiatric:        Mood and Affect: Mood and affect and mood normal.        Behavior: Behavior  normal.        Thought Content: Thought content normal.        Judgment: Judgment normal.     ED Results / Procedures / Treatments   Labs (all labs ordered are listed, but only abnormal results are displayed) Labs Reviewed  COMPREHENSIVE METABOLIC PANEL - Abnormal; Notable for the following components:      Result Value   Sodium 133 (*)    Creatinine, Ser 1.06 (*)    Albumin 2.8 (*)    AST 60 (*)    Total Bilirubin 5.9 (*)    GFR, Estimated 56 (*)    All other components within normal limits  CBC WITH DIFFERENTIAL/PLATELET - Abnormal; Notable for the following components:   WBC 3.5 (*)    RDW 16.4 (*)    Platelets 52 (*)    Lymphs Abs 0.5 (*)    All other components within normal limits  TROPONIN I (HIGH SENSITIVITY) - Abnormal; Notable for the following components:   Troponin I (High Sensitivity) 27 (*)    All other components within normal limits  SARS CORONAVIRUS 2 (TAT 6-24 HRS)  URINALYSIS, ROUTINE W REFLEX MICROSCOPIC    EKG EKG Interpretation  Date/Time:  Sunday April 12 2020 13:22:46 EST Ventricular Rate:  102 PR Interval:    QRS Duration: 151 QT Interval:  432 QTC Calculation: 563 R Axis:   118 Text Interpretation: indeterminate rhythym Consider left ventricular hypertrophy Prolonged QT interval QT has lengthened Since last tracing Otherwise no significant change Confirmed by Daleen Bo [phone]) on 04/12/2020 1:29:09 PM   Radiology DG Lumbar Spine Complete  Result Date: 04/12/2020 CLINICAL DATA:  Fall today, low back and hip pain EXAM: LUMBAR SPINE - COMPLETE 4+ VIEW COMPARISON:  02/23/2020 CT scan and 04/25/2006 radiographs FINDINGS: Approximately 50% L1 compression fracture with vertebral augmentation similar to the 02/13/2020 exam. New mild superior endplate compression fracture at L2, with concavity of the superior endplate best depicted on the lateral projection and not previously present on 02/13/2020. Interbody cage devices at L4-5 and L5-S1 not  appreciably changed. The facet joints are absent at L4-5 and L5-S1 Bony demineralization. IMPRESSION: 1. New mild superior endplate compression fracture at L2 with concavity of the superior endplate. 2. Stable old L1 compression fracture with vertebral augmentation. 3. Interbody cage devices at L4-5 and L5-S1 not appreciably changed. 4. Bony demineralization, overall appearance favors osteoporosis. Electronically Signed   By: Van Clines M.D.   On: 04/12/2020 12:12   DG Pelvis 1-2 Views  Result Date: 04/12/2020 CLINICAL DATA:  Fall this morning, hip pain EXAM: PELVIS - 1-2 VIEW COMPARISON:  CT pelvis 01/24/2020 FINDINGS: Moderate to severe degenerative left hip arthropathy with degenerative subcortical cyst formation is specially in the femoral head, as well as loss of articular space and mild subcortical sclerosis. These findings were also present on the 01/24/2020 exam and accordingly are not felt to be acute. There is some mild spurring of the left femoral head laterally, likewise stable. I not see an obvious new cortical discontinuity involving the left proximal femur to indicate a new femoral fracture. Right total hip prosthesis with the femoral head component reasonably aligned  with the acetabular shell component on this single view. There is slightly less space between the femoral head component in the shell component superiorly but this is probably from polymer wear, and the same appearance was present on 01/24/2020. Bony lucency below the right acetabulum compatible with particulate disease, unchanged from 01/24/2020. The arcuate lines of the sacrum appear continuous, as expected. No discrete fracture identified. IMPRESSION: 1. 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 2. No new fracture identified. 3. Right total hip prosthesis. Stable lucency below the acetabular shell component is likely a manifestation of particulate disease. Electronically Signed    By: Van Clines M.D.   On: 04/12/2020 12:15    Procedures Procedures   Medications Ordered in ED Medications  0.9 %  sodium chloride infusion ( Intravenous New Bag/Given 04/12/20 1424)  fentaNYL (SUBLIMAZE) injection 100 mcg (has no administration in time range)  oxyCODONE-acetaminophen (PERCOCET/ROXICET) 5-325 MG per tablet 1 tablet (1 tablet Oral Given 04/12/20 1240)    ED Course  I have reviewed the triage vital signs and the nursing notes.  Pertinent labs & imaging results that were available during my care of the patient were reviewed by me and considered in my medical decision making (see chart for details).  Clinical Course as of 04/12/20 1518  Sun Apr 12, 2020  1347 Patient informed nursing that she was having right anterior chest pain.  EKG was done and does not show anything acute.  I evaluated the patient she states she has chronic intermittent chest pain and feels that it is from her heart.  She typically takes nitroglycerin a couple times a month.  The last time she had to use it was about a week ago.  She also states she is feeling short of breath and having numbness in her left thigh at this time.  She is having trouble moving in the bed because of her low back pain.  She has tried sitting up but is unable to do so because of the severe pain in her low back.  She denies a history of reflux or heartburn.  Will order additional medication, and initiate evaluation, anticipating hospitalization for pain control at this time. [EW]    Clinical Course User Index [EW] Daleen Bo, MD   MDM Rules/Calculators/A&P                           Patient Vitals for the past 24 hrs:  BP Temp Pulse Resp SpO2 Height Weight  04/12/20 1500 (!) 104/50 - (!) 109 17 98 % - -  04/12/20 1445 - - (!) 111 16 98 % - -  04/12/20 1430 (!) 108/55 - (!) 106 12 100 % - -  04/12/20 1415 - - (!) 108 17 98 % - -  04/12/20 1400 (!) 121/47 - (!) 105 14 100 % - -  04/12/20 1345 - - (!) 107 13 100 %  - -  04/12/20 1330 (!) 109/43 - (!) 108 13 100 % - -  04/12/20 1323 (!) 119/45 - (!) 104 16 100 % - -  04/12/20 1315 - - 98 - 100 % - -  04/12/20 1300 (!) 127/49 - (!) 103 - 100 % - -  04/12/20 1245 - - (!) 111 - 100 % - -  04/12/20 1230 (!) 124/46 - - - - - -  04/12/20 1215 - - (!) 104 - 97 % - -  04/12/20 1200 [name])  128/43 - (!) 107 - 98 % - -  04/12/20 1130 (!) 138/53 - (!) 112 - 99 % - -  04/12/20 1115 - - (!) 112 - 100 % - -  04/12/20 1111 (!) 127/52 98.1 F (36.7 C) (!) 112 18 100 % 5' 2"  (1.575 m) 74.8 kg    3:18 PM Reevaluation with update and discussion. After initial assessment and treatment, an updated evaluation reveals no change in clinical status, findings discussed with patient, all questions answered. Daleen Bo   Medical Decision Making:  This patient is presenting for evaluation of fall with injury to lower back, which does require a range of treatment options, and is a complaint that involves a moderate risk of morbidity and mortality. The differential diagnoses include fracture, contusion. I decided to review old records, and in summary elderly female, fell while walking, without ongoing neurologic complaints.  I did not require additional historical information from anyone.  Laboratory test ordered: C-Met, troponin, CBC>> results reviewed, sodium slightly low, creatinine slightly high, albumin low, AST high, total bilirubin high, GFR low, troponin high, WBC low, platelets low Radiologic Tests Ordered, included x-rays pelvis and lumbar spine.  I independently Visualized: Radiologic images, which show L2 compression fracture, mild per report of radiologist; rest no acute    Critical Interventions-clinical evaluation, radiography, medication treatment, observation, worsening condition, requiring additional evaluation, laboratory testing and additional pain medication  After These Interventions, the Patient was reevaluated and was found to require hospitalization for  persistent back pain associated with new L2 compression fracture following fall, this morning. Screening evaluation with stable chronic problems including hyperbilirubinemia, troponin, thrombocytopenia  CRITICAL CARE-no Performed by: Daleen Bo  Nursing Notes Reviewed/ Care Coordinated Applicable Imaging Reviewed Interpretation of Laboratory Data incorporated into ED treatment  3:02 PM-Consult complete with hospitalist. Patient case explained and discussed.  He agrees to admit patient for further evaluation and treatment. Call ended at 3:15 PM    Final Clinical Impression(s) / ED Diagnoses Final diagnoses:  Closed compression fracture of L2 lumbar vertebra with delayed healing, subsequent encounter    Rx / DC Orders ED Discharge Orders    None       Daleen Bo, MD 04/12/20 1519

## 2020-04-12 NOTE — ED Notes (Signed)
Pt reports chest pain x 1 hour started as aching progressed to sharp constant anterior left and sternal chest pain that is causing her sob. Denies nausea or diaphoresis. Pt reports history of similar which has been diagnosed as angina in the past. O2@2lnc  applied. EKG completed and spoke with Dr. Letta Median.

## 2020-04-12 NOTE — H&P (Signed)
History and Physical    Samantha [name] ZSW:[id number] DOB: May 07, 1950 DOA: 04/12/2020  I have briefly reviewed the patient's prior medical records in [place]  PCP: Samantha Hilding, MD  Patient coming from: home  Chief Complaint: back pain   HPI: Samantha [name] is a 70 y.o. female with medical history significant of liver cirrhosis with history of GI bleed, CAD with history of CABG in 2001, CKD stage IIIa, hypertension, hyperlipidemia, paroxysmal a flutter of, chronic LBBB, chronic systolic CHF with EF 32-20% who comes into the hospital with severe back pain after having a fall at home.  Patient tripped and fell, states that it was a mechanical fall, denies any loss of consciousness, denies any lightheadedness or dizziness.  Denies any chest pain or palpitations at the time or prior to the fall.  She complains of chronic shortness of breath, not worse than her baseline.  She is on oxygen at home.  She reports that her weight has been going up and down, and has been having increased weight gain for which her diuretics have been changed and currently reports being on torsemide 40 mg twice daily.  States that her legs are always swollen but denies any significant fluid overload or weight gain recently.  While in the ED she had a transient episode of chest pressure, tells me she had those in the past and this episode was different than her cardiac chest pain.  ED Course: Well, heart rate is around 100, she is normotensive and satting 98-99% on her chronic 2 L.  Blood work reveals mild hyponatremia with sodium of 133, creatinine 1.06, WBC is 3.5 and platelets are 52.  SARS-CoV-2 pending. A lumbar x-ray showed a mild superior endplate compression fracture at L2, stable old L1 compression fracture with vertebral augmentation.  Pelvic x-ray showed right total hip prosthesis with stable appearance without new fractures identified.  Patient is unable to ambulate in the ED, pain was not being able to be  controlled with oral agents and we are asked to admit.  Review of Systems: All systems reviewed, and apart from HPI, all negative  Past Medical History:  Diagnosis Date  . Cataract    OU  . Chronic combined systolic and diastolic CHF (congestive heart failure) ([place])   . Cirrhosis of liver ([place])   . CKD (chronic kidney disease), stage III ([place])   . Coronary artery disease    a. s/p CABG 2001 w/ (LIMA-OM, SVG-D1, SVG-RCA). b. h/o multiple PCIs per Dr. Antionette Char note.  . Depression   . Esophageal varices ([place])    New 2013  . Gastroesophageal reflux disease   . History of pneumonia   . Hyperlipidemia   . Hypertension   . Hypertensive retinopathy    OU  . Obesity   . Osteoarthritis   . Pancytopenia ([place])   . Paroxysmal atrial flutter ([place])    a. dx 05/2016.  [name] Persistent atrial fibrillation ([place])    a. reported in hosp 07/2016, not on anticoag due to cirrhosis and liver disease, low platelets, varices.  . Thrombocytopenia ([place])     Past Surgical History:  Procedure Laterality Date  . ABDOMINAL HYSTERECTOMY    . BACK SURGERY    . CARDIAC CATHETERIZATION  2004   left internal mammary artery to the obtuse marginal  was found to be small and thread like.  The two grafts were patent.  The left circumflex had 90% in-stent restenosis and cutting balloon angioplasty was performed followed by  placement of a 3.0 x 70m Taxus drug -eluting stent.    .Marland KitchenCARDIAC CATHETERIZATION  2006   There was in-stent restenosis in the left circumflex and this was treated with cutting balloon angioplasty   . CARDIAC CATHETERIZATION  2008   vein graft to the to the obtuse marginal was patent, although small, left circumflex had 40% in-stent restenosis, ejection fraction 40-45%.  The patient was medically mananged.  .Marland KitchenCARDIAC CATHETERIZATION N/A 01/09/2015   Procedure: Left Heart Cath and Cors/Grafts Angiography;  Surgeon: HBelva Crome MD;  [facility];  Service: Cardiovascular;  Laterality:  N/A;  . CATARACT EXTRACTION W/PHACO Right 05/17/2019   Procedure: CATARACT EXTRACTION PHACO AND INTRAOCULAR LENS PLACEMENT (IOC) (CDE: 14.99);  Surgeon: WBaruch Goldmann MD;  Location: [facility];  Service: Ophthalmology;  Laterality: Right;  . CATARACT EXTRACTION W/PHACO Left 06/10/2019   Procedure: CATARACT EXTRACTION PHACO AND INTRAOCULAR LENS PLACEMENT (IOC);  Surgeon: WBaruch Goldmann MD;  Location: [facility];  Service: Ophthalmology;  Laterality: Left;  CDE: 11.96  . cataract sx    . COLONOSCOPY  03/29/2011   Procedure: COLONOSCOPY;  Surgeon: MJamesetta So MD;  Location: [facility];  Service: Gastroenterology;  Laterality: N/A;  . CORONARY ARTERY BYPASS GRAFT  May 31,2001   x 3 with a vein graft to the first diagonal, vein graft to the right coronary  artery, and a free left internal mammary  artery to the obtuse marginal   . ESOPHAGEAL BANDING N/A 07/04/2012   Procedure: ESOPHAGEAL BANDING;  Surgeon: NRogene Houston MD;  [facility];  Service: Endoscopy;  Laterality: N/A;  . ESOPHAGEAL BANDING N/A 09/17/2012   Procedure: ESOPHAGEAL BANDING;  Surgeon: NRogene Houston MD;  [facility];  Service: Endoscopy;  Laterality: N/A;  . ESOPHAGEAL BANDING N/A 10/22/2013   Procedure: ESOPHAGEAL BANDING;  Surgeon: NRogene Houston MD;  [facility];  Service: Endoscopy;  Laterality: N/A;  . ESOPHAGEAL BANDING N/A 11/28/2014   Procedure: ESOPHAGEAL BANDING;  Surgeon: NRogene Houston MD;  [facility];  Service: Endoscopy;  Laterality: N/A;  . ESOPHAGEAL BANDING N/A 10/29/2015   Procedure: ESOPHAGEAL BANDING;  Surgeon: NRogene Houston MD;  [facility];  Service: Endoscopy;  Laterality: N/A;  . ESOPHAGOGASTRODUODENOSCOPY  12/20/2011   Procedure: ESOPHAGOGASTRODUODENOSCOPY (EGD);  Surgeon: MJamesetta So MD;  [facility];  Service: Gastroenterology;  Laterality: N/A;  . ESOPHAGOGASTRODUODENOSCOPY N/A 07/04/2012   Procedure:  ESOPHAGOGASTRODUODENOSCOPY (EGD);  Surgeon: NRogene Houston MD;  [facility];  Service: Endoscopy;  Laterality: N/A;  235-moved to 255 Ann to notify pt  . ESOPHAGOGASTRODUODENOSCOPY N/A 09/17/2012   Procedure: ESOPHAGOGASTRODUODENOSCOPY (EGD);  Surgeon: NRogene Houston MD;  [facility];  Service: Endoscopy;  Laterality: N/A;  730  . ESOPHAGOGASTRODUODENOSCOPY N/A 10/22/2013   Procedure: ESOPHAGOGASTRODUODENOSCOPY (EGD);  Surgeon: NRogene Houston MD;  [facility];  Service: Endoscopy;  Laterality: N/A;  730  . ESOPHAGOGASTRODUODENOSCOPY N/A 11/28/2014   Procedure: ESOPHAGOGASTRODUODENOSCOPY (EGD);  Surgeon: NRogene Houston MD;  [facility];  Service: Endoscopy;  Laterality: N/A;  1:25  . ESOPHAGOGASTRODUODENOSCOPY N/A 10/29/2015   Procedure: ESOPHAGOGASTRODUODENOSCOPY (EGD);  Surgeon: NRogene Houston MD;  [facility];  Service: Endoscopy;  Laterality: N/A;  12:00  . ESOPHAGOGASTRODUODENOSCOPY N/A 12/12/2016   Grade 1 and 2 varices in lower third of esophagus, post variceal banding scar distal esophagus, mild portal hypertensive gastropathy, Type 1 gastroesophageal  varices without bleeding, normal duodenum and second portion of duodenum.   [name] FLEXIBLE SIGMOIDOSCOPY N/A 03/20/2017   external hemorrhoids  . JOINT REPLACEMENT    . LEFT HEART CATH AND CORS/GRAFTS ANGIOGRAPHY N/A 08/15/2016   Procedure: Left Heart Cath and Cors/Grafts Angiography;  Surgeon: Jettie Booze, MD;  Location: [place] [facility];  Service: Cardiovascular;  Laterality: N/A;  . LEFT HEART CATHETERIZATION WITH CORONARY/GRAFT ANGIOGRAM N/A 12/25/2013   Procedure: LEFT HEART CATHETERIZATION WITH Beatrix Fetters;  Surgeon: Blane Ohara, MD;  Location: [facility] LAB;  Service: Cardiovascular;  Laterality: N/A;  . RIGHT HEART CATH N/A 05/29/2017   Procedure: RIGHT HEART CATH;  Surgeon: Jolaine Artist, MD;  Location: [place] [facility];  Service:  Cardiovascular;  Laterality: N/A;  . rotator cuff left  2007  . TONSILLECTOMY       reports that she quit smoking about 24 years ago. Her smoking use included cigarettes. She has a 10.00 pack-year smoking history. She has never used smokeless tobacco. She reports that she does not drink alcohol and does not use drugs.  Allergies  Allergen Reactions  . Entresto [Sacubitril-Valsartan] Swelling    Swelling of eyes  . Vibra-Tab [Doxycycline] Nausea Only  . Acetaminophen Other (See Comments)    Liver problems  . Ace Inhibitors Other (See Comments)    UNSURE OF REACTION TYPE   . Cefaclor Itching and Rash    Pt tolerates meropenem  . Cephalexin Itching and Rash    Pt tolerates meropenem  . Penicillins Rash    Has patient had a PCN reaction causing immediate rash, facial/tongue/throat swelling, SOB or lightheadedness with hypotension: YES Has patient had a PCN reaction causing severe rash involving mucus membranes or skin necrosis: NO Has patient had a PCN reaction that required hospitalization: YES Has patient had a PCN reaction occurring within the last 10 years: NO If all of the above answers are "NO", then may proceed with Cephalosporin use. Pt tolerates meropenem 02/19/20   . Pregabalin Other (See Comments)    Retains fluid  . Tape Itching and Rash    Takes skin right off with medical tape--PAPER TAPE ONLY    Family History  Problem Relation Age of Onset  . Heart attack Mother   . Diabetes Mother   . Heart attack Father 53       cause of death  . Diabetes Father   . Coronary artery disease Sister        CABG   . Diabetes Sister   . Glaucoma Sister   . Diabetes Brother   . Colon cancer Neg Hx     Prior to Admission medications   Medication Sig Start Date End Date Taking? Authorizing Provider  albuterol (PROVENTIL HFA;VENTOLIN HFA) 108 (90 BASE) MCG/ACT inhaler Inhale 2 puffs into the lungs every 6 (six) hours as needed for wheezing or shortness of breath.     [provider]  cyclobenzaprine (FLEXERIL) 10 MG tablet Take 10 mg by mouth at bedtime.    [provider]  ezetimibe (ZETIA) 10 MG tablet Take 10 mg by mouth at bedtime.    [provider]  FLUoxetine (PROZAC) 20 MG capsule Take 20 mg by mouth daily.     [provider]  ipratropium (ATROVENT) 0.03 % nasal spray Place 1 spray into both nostrils daily as needed (allergies).  12/23/19   [provider]  metoprolol tartrate (LOPRESSOR) 25 MG tablet Take 0.5 tablets (12.5 mg total) by mouth  daily as needed (palpitations). Only take if your heart rate is over 120 beats per minute. 03/31/20   Kathlen Mody, Scott T, PA-C  NITROSTAT 0.4 MG SL tablet Place 0.4 mg under the tongue every 5 (five) minutes as needed for chest pain (x 3 tabs daily).  11/15/13   [provider]  ondansetron (ZOFRAN ODT) 4 MG disintegrating tablet Take 1 tablet (4 mg total) by mouth every 8 (eight) hours as needed for nausea or vomiting. 02/04/20   Manuella Ghazi, Pratik D, DO  Oxycodone HCl 10 MG TABS Take 10 mg by mouth daily.    [provider]  pantoprazole (PROTONIX) 40 MG tablet Take 40 mg by mouth daily.    [provider]  polyethylene glycol (MIRALAX / GLYCOLAX) 17 g packet Take 17 g by mouth daily. 03/12/20   Rehman, Mechele Dawley, MD  potassium chloride SA (KLOR-CON) 20 MEQ tablet Take 1 tablet (20 mEq total) by mouth 2 (two) times daily. 04/09/20   Richardson Dopp T, PA-C  psyllium (METAMUCIL SMOOTH TEXTURE) 58.6 % powder Take 1 packet by mouth at bedtime. 02/11/20   Rogene Houston, MD  spironolactone (ALDACTONE) 25 MG tablet Take 25 mg by mouth daily.    [provider]    Physical Exam: Vitals:   04/12/20 1415 04/12/20 1430 04/12/20 1445 04/12/20 1500  BP:  (!) 108/55  (!) 104/50  Pulse: (!) 108 (!) 106 (!) 111 (!) 109  Resp: 17 12 16 17   Temp:      SpO2: 98% 100% 98% 98%  Weight:      Height:        Constitutional: NAD, calm, comfortable Eyes: PERRL, lids and  conjunctivae normal, mild scleral icterus ENMT: Mucous membranes are moist Neck: normal, supple Respiratory: clear to auscultation bilaterally, no wheezing, no crackles. Normal respiratory effort. No accessory muscle use.  Cardiovascular: Irregular, 3/6 SEM.  Trace lower extremity edema. Abdomen: Mild tenderness to palpation throughout, no masses palpated. Bowel sounds positive.  Musculoskeletal: no clubbing / cyanosis. Normal muscle tone.  Skin: no rashes, lesions, ulcers. No induration Neurologic: CN 2-12 grossly intact. Strength 5/5 in all 4.  Psychiatric: Normal judgment and insight. Alert and oriented x 3.   Labs on Admission: I have personally reviewed following labs and imaging studies  CBC: Recent Labs  Lab 04/12/20 1411  WBC 3.5*  NEUTROABS 2.6  HGB 12.1  HCT 38.1  MCV 96.9  PLT 52*   Basic Metabolic Panel: Recent Labs  Lab 04/08/20 1519 04/12/20 1411  NA 138 133*  K 4.6 4.2  CL 99 99  CO2 23 25  GLUCOSE 93 98  BUN 12 12  CREATININE 1.00 1.06*  CALCIUM 8.8 8.9   Liver Function Tests: Recent Labs  Lab 04/12/20 1411  AST 60*  ALT 25  ALKPHOS 88  BILITOT 5.9*  PROT 6.6  ALBUMIN 2.8*   Coagulation Profile: No results for input(s): INR, PROTIME in the last 168 hours. BNP (last 3 results) No results for input(s): PROBNP in the last 8760 hours. CBG: No results for input(s): GLUCAP in the last 168 hours. Thyroid Function Tests: No results for input(s): TSH, T4TOTAL, FREET4, T3FREE, THYROIDAB in the last 72 hours. Urine analysis:    Component Value Date/Time   COLORURINE YELLOW 02/16/2020 1710   APPEARANCEUR CLEAR 02/16/2020 1710   LABSPEC 1.015 02/16/2020 1710   PHURINE 5.0 02/16/2020 1710   GLUCOSEU NEGATIVE 02/16/2020 1710   HGBUR NEGATIVE 02/16/2020 1710   [place] 02/16/2020 1710  KETONESUR NEGATIVE 02/16/2020 1710   PROTEINUR NEGATIVE 02/16/2020 1710   NITRITE NEGATIVE 02/16/2020 1710   LEUKOCYTESUR NEGATIVE 02/16/2020 1710      Radiological Exams on Admission: DG Lumbar Spine Complete  Result Date: 04/12/2020 CLINICAL DATA:  Fall today, low back and hip pain EXAM: LUMBAR SPINE - COMPLETE 4+ VIEW COMPARISON:  02/23/2020 CT scan and 04/25/2006 radiographs FINDINGS: Approximately 50% L1 compression fracture with vertebral augmentation similar to the 02/13/2020 exam. New mild superior endplate compression fracture at L2, with concavity of the superior endplate best depicted on the lateral projection and not previously present on 02/13/2020. Interbody cage devices at L4-5 and L5-S1 not appreciably changed. The facet joints are absent at L4-5 and L5-S1 Bony demineralization. IMPRESSION: 1. New mild superior endplate compression fracture at L2 with concavity of the superior endplate. 2. Stable old L1 compression fracture with vertebral augmentation. 3. Interbody cage devices at L4-5 and L5-S1 not appreciably changed. 4. Bony demineralization, overall appearance favors osteoporosis. Electronically Signed   By: Van Clines M.D.   On: 04/12/2020 12:12   DG Pelvis 1-2 Views  Result Date: 04/12/2020 CLINICAL DATA:  Fall this morning, hip pain EXAM: PELVIS - 1-2 VIEW COMPARISON:  CT pelvis 01/24/2020 FINDINGS: Moderate to severe degenerative left hip arthropathy with degenerative subcortical cyst formation is specially in the femoral head, as well as loss of articular space and mild subcortical sclerosis. These findings were also present on the 01/24/2020 exam and accordingly are not felt to be acute. There is some mild spurring of the left femoral head laterally, likewise stable. I not see an obvious new cortical discontinuity involving the left proximal femur to indicate a new femoral fracture. Right total hip prosthesis with the femoral head component reasonably aligned with the acetabular shell component on this single view. There is slightly less space between the femoral head component in the shell component superiorly but  this is probably from polymer wear, and the same appearance was present on 01/24/2020. Bony lucency below the right acetabulum compatible with particulate disease, unchanged from 01/24/2020. The arcuate lines of the sacrum appear continuous, as expected. No discrete fracture identified. IMPRESSION: 1. 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 2. No new fracture identified. 3. Right total hip prosthesis. Stable lucency below the acetabular shell component is likely a manifestation of particulate disease. Electronically Signed   By: Van Clines M.D.   On: 04/12/2020 12:15    EKG: Independently reviewed.  A. fib, left bundle branch block  Assessment/Plan  Principal Problem Fall, new L2 compression fracture, acute on chronic back pain -Patient unable to ambulate, cannot be safely discharged home from the emergency room.  Will admit to the hospital for pain control, start low-dose IV Dilaudid and alternate with oxycodone, continue home Flexeril, add lidocaine patch.  PT consult  Active Problems Chest pain, history of coronary artery disease, history of CABG -Initial high-sensitivity troponin mildly elevated at 27, she does have a degree of chronic elevation with most recent high-sensitivity troponin 39 in November 2021 -Less likely ACS, will repeat her troponin  Chronic systolic CHF -Awaiting pharmacy to reconcile, she does mention torsemide but is not on her medication list.  Most recent 2D echo done last year showed an EF of 35-40%.  There is mild lower extremity swelling but overall appears euvolemic without significant fluid overload  Liver cirrhosis, pancytopenia -With history of GI bleed, this appears stable right now.  Monitor labs -Platelets 52, no bleeding -Noted chronic  elevation of bilirubin, mild AST elevation  Hyponatremia -Mild, in the setting of liver disease.  Monitor  Paroxysmal A. fib -She appears to be on metoprolol as  needed, due to heart rate 100-110, start low-dose metoprolol -Not anticoagulated due to history of GI bleed  Chronic hypoxic respiratory failure -At baseline, continue supplemental oxygen    DVT prophylaxis: CDs Code Status: DNR Family Communication: Sister-in-law at bedside Disposition Plan: Home when ready Bed Type: Telemetry Consults called: None Obs/Inp: Observation  Marzetta Board, MD, PhD [facility]  Contact via [email or web address]  04/12/2020, 3:34 PM

## 2020-04-12 NOTE — ED Triage Notes (Signed)
Pt fell this morning trying to use her walker. Fell on her left hip. C/o of lower back and right hip pain. Denies hitting head

## 2020-04-13 ENCOUNTER — Observation Stay (HOSPITAL_COMMUNITY): Payer: Medicare Other

## 2020-04-13 DIAGNOSIS — S32020A Wedge compression fracture of second lumbar vertebra, initial encounter for closed fracture: Principal | ICD-10-CM

## 2020-04-13 DIAGNOSIS — K7460 Unspecified cirrhosis of liver: Secondary | ICD-10-CM | POA: Diagnosis not present

## 2020-04-13 DIAGNOSIS — I480 Paroxysmal atrial fibrillation: Secondary | ICD-10-CM

## 2020-04-13 DIAGNOSIS — R296 Repeated falls: Secondary | ICD-10-CM

## 2020-04-13 DIAGNOSIS — J9611 Chronic respiratory failure with hypoxia: Secondary | ICD-10-CM | POA: Diagnosis present

## 2020-04-13 DIAGNOSIS — I255 Ischemic cardiomyopathy: Secondary | ICD-10-CM

## 2020-04-13 DIAGNOSIS — M545 Low back pain, unspecified: Secondary | ICD-10-CM | POA: Diagnosis not present

## 2020-04-13 DIAGNOSIS — Z951 Presence of aortocoronary bypass graft: Secondary | ICD-10-CM

## 2020-04-13 LAB — COMPREHENSIVE METABOLIC PANEL
ALT: 26 U/L (ref 0–44)
AST: 64 U/L — ABNORMAL HIGH (ref 15–41)
Albumin: 2.7 g/dL — ABNORMAL LOW (ref 3.5–5.0)
Alkaline Phosphatase: 86 U/L (ref 38–126)
Anion gap: 12 (ref 5–15)
BUN: 13 mg/dL (ref 8–23)
CO2: 20 mmol/L — ABNORMAL LOW (ref 22–32)
Calcium: 8.8 mg/dL — ABNORMAL LOW (ref 8.9–10.3)
Chloride: 100 mmol/L (ref 98–111)
Creatinine, Ser: 0.82 mg/dL (ref 0.44–1.00)
GFR, Estimated: >60 mL/min (ref >60)
Glucose, Bld: 118 mg/dL — ABNORMAL HIGH (ref 70–99)
Potassium: 4.2 mmol/L (ref 3.5–5.1)
Sodium: 132 mmol/L — ABNORMAL LOW (ref 135–145)
Total Bilirubin: 5.6 mg/dL — ABNORMAL HIGH (ref 0.3–1.2)
Total Protein: 6.6 g/dL (ref 6.5–8.1)

## 2020-04-13 LAB — CBC
HCT: 38.6 % (ref 36.0–46.0)
Hemoglobin: 11.7 g/dL — ABNORMAL LOW (ref 12.0–15.0)
MCH: 30.2 pg (ref 26.0–34.0)
MCHC: 30.3 g/dL (ref 30.0–36.0)
MCV: 99.7 fL (ref 80.0–100.0)
Platelets: 56 10*3/uL — ABNORMAL LOW (ref 150–400)
RBC: 3.87 MIL/uL (ref 3.87–5.11)
RDW: 16.4 % — ABNORMAL HIGH (ref 11.5–15.5)
WBC: 6.7 10*3/uL (ref 4.0–10.5)
nRBC: 0 % (ref 0.0–0.2)

## 2020-04-13 LAB — SARS CORONAVIRUS 2 (TAT 6-24 HRS): SARS Coronavirus 2: NEGATIVE

## 2020-04-13 MED ORDER — METHOCARBAMOL 500 MG PO TABS
500.0000 mg | ORAL_TABLET | Freq: Three times a day (TID) | ORAL | Status: DC
  Administered 2020-04-13 – 2020-04-15 (×8): 500 mg via ORAL
  Filled 2020-04-13 (×8): qty 1

## 2020-04-13 NOTE — Progress Notes (Signed)
Patient Demographics:    Tyreka Henneke, is a 70 y.o. female, DOB - July 29, 1950, BTD:[id number]  Admit date - 04/12/2020   Admitting Physician Costin Karlyne Greenspan, MD  Outpatient Primary MD for the patient is Sasser, Silvestre Moment, MD  LOS - 0   Chief Complaint  Patient presents with  . Fall        Subjective:    Henrietta Hoover today has no fevers, no emesis,  No chest pain,   Complains of persistent low back pain  Assessment  & Plan :    Principal Problem:   Compression fracture of L2 lumbar vertebra (HCC) Active Problems:   Paroxysmal atrial fibrillation (HCC)   Chronic respiratory failure with hypoxia -CHF    S/P CABG x 3- 2001   Cirrhosis, non-alcoholic (HCC)   Cardiomyopathy, ischemic-35-40% by cath    Recurrent falls  Brief Summary:- 70 y.o. female with medical history significant of liver cirrhosis with history of GI bleed, CAD with history of CABG in 2001, CKD stage IIIa, hypertension, hyperlipidemia, paroxysmal a flutter of, chronic LBBB, chronic systolic CHF with EF 10-62%  A/p 1) status post fall with new L2 compression fracture with ambulatory dysfunction--- PT recommends SNF -get MRI for further evaluation to see if she is a candidate for kyphoplasty/vertebroplasty -Continue opiates and muscle relaxants  2)HFrEF-- history of ischemic cardiomyopathy --- last known EF 35 to 69%, chronic systolic dysfunction CHF -No evidence of flareup at this time appears euvolemic and compensated at this time -Continue Aldactone 25 mg daily, and metoprolol -Patient has intolerance to Entresto -No aspirin due to chronic anemia and thrombocytopenia with GI bleed  3) liver cirrhosis with pancytopenia--- no bleeding noted at this time, patient does have history of prior GI bleed, monitor closely  4)PAF--- continue metoprolol for rate control, not a candidate for anticoagulation due to history of GI bleed and  chronic thrombocytopenia and anemia  5) chronic hypoxic respiratory failure--- continue home O2, no acute decompensation at this time  6) depression and anxiety--Prozac 20 mg daily,  Disposition/Need for in-Hospital Stay- patient unable to be discharged at this time due to -- status post fall with back pain and Lumbar fracture -requiring IV medication for pain control, unsafe discharge due to fall risk awaiting SNF placement*  Disposition: The patient is from: Home              Anticipated d/c is to: SNF              Anticipated d/c date is: 2 days              Patient currently is not medically stable to d/c. Barriers: Not Clinically Stable-   Code Status :  -  Code Status: DNR   Family Communication:    NA (patient is alert, awake and coherent)   Consults  :    DVT Prophylaxis  :   - SCDs SCDs Start: 04/12/20 1931    Lab Results  Component Value Date   PLT 56 (L) 04/13/2020    Inpatient Medications  Scheduled Meds: . ezetimibe  10 mg Oral QHS  . FLUoxetine  20 mg Oral Daily  . lidocaine  1 patch Transdermal Q24H  . methocarbamol  500 mg Oral TID  .  metoprolol tartrate  12.5 mg Oral BID  . spironolactone  25 mg Oral Daily   Continuous Infusions: . sodium chloride 125 mL/hr at 04/12/20 2217   PRN Meds:.albuterol, cyclobenzaprine, HYDROmorphone (DILAUDID) injection, nitroGLYCERIN, oxyCODONE    Anti-infectives (From admission, onward)   None        Objective:   Vitals:   04/12/20 2000 04/12/20 2030 04/12/20 2138 04/13/20 0944  BP: (!) 106/56 (!) 114/57 (!) 128/51 (!) 106/54  Pulse: (!) 104 (!) 101 (!) 105 98  Resp: 13 17 16    Temp:      SpO2: 94% 94% 96%   Weight:      Height:        Wt Readings from Last 3 Encounters:  04/12/20 74.8 kg  04/08/20 77.7 kg  03/12/20 71.1 kg     Intake/Output Summary (Last 24 hours) at 04/13/2020 1600 Last data filed at 04/13/2020 0300 Gross per 24 hour  Intake 1489.17 ml  Output -  Net 1489.17 ml     Physical  Exam  Gen:- Awake Alert, no acute distress HEENT:- [place].AT, No sclera icterus Neck-Supple Neck,No JVD,.  Lungs-no wheezing, fair symmetrical air movement CV- S1, S2 normal, irregularly irregular Abd-  +ve B.Sounds, Abd Soft, No tenderness,    Extremity/Skin:- No  edema, pedal pulses present  Psych-affect is appropriate, oriented x3 Neuro-no new focal deficits, no tremors MSK-mobility related challenges especially along the lumbar spine area   Data Review:   Micro Results Recent Results (from the past 240 hour(s))  SARS CORONAVIRUS 2 (TAT 6-24 HRS) Nasopharyngeal Nasopharyngeal Swab     Status: None   Collection Time: 04/12/20  2:16 PM   Specimen: Nasopharyngeal Swab  Result Value Ref Range Status   SARS Coronavirus 2 NEGATIVE NEGATIVE Final    Comment: (NOTE) SARS-CoV-2 target nucleic acids are NOT DETECTED.  The SARS-CoV-2 RNA is generally detectable in upper and lower respiratory specimens during the acute phase of infection. Negative results do not preclude SARS-CoV-2 infection, do not rule out co-infections with other pathogens, and should not be used as the sole basis for treatment or other patient management decisions. Negative results must be combined with clinical observations, patient history, and epidemiological information. The expected result is Negative.  Fact Sheet for Patients: SugarRoll.be  Fact Sheet for Healthcare Providers: https://www.woods-mathews.com/  This test is not yet approved or cleared by the [place] FDA and  has been authorized for detection and/or diagnosis of SARS-CoV-2 by FDA under an Emergency Use Authorization (EUA). This EUA will remain  in effect (meaning this test can be used) for the duration of the COVID-19 declaration under Se ction 564(b)(1) of the Act, 21 U.S.C. section 360bbb-3(b)(1), unless the authorization is terminated or revoked sooner.  Performed at [facility],  [place] [address], [place], [place] [address]     Radiology Reports DG Lumbar Spine Complete  Result Date: 04/12/2020 CLINICAL DATA:  Fall today, low back and hip pain EXAM: LUMBAR SPINE - COMPLETE 4+ VIEW COMPARISON:  02/23/2020 CT scan and 04/25/2006 radiographs FINDINGS: Approximately 50% L1 compression fracture with vertebral augmentation similar to the 02/13/2020 exam. New mild superior endplate compression fracture at L2, with concavity of the superior endplate best depicted on the lateral projection and not previously present on 02/13/2020. Interbody cage devices at L4-5 and L5-S1 not appreciably changed. The facet joints are absent at L4-5 and L5-S1 Bony demineralization. IMPRESSION: 1. New mild superior endplate compression fracture at L2 with concavity of the superior endplate. 2.  Stable old L1 compression fracture with vertebral augmentation. 3. Interbody cage devices at L4-5 and L5-S1 not appreciably changed. 4. Bony demineralization, overall appearance favors osteoporosis. Electronically Signed   By: Van Clines M.D.   On: 04/12/2020 12:12   DG Pelvis 1-2 Views  Result Date: 04/12/2020 CLINICAL DATA:  Fall this morning, hip pain EXAM: PELVIS - 1-2 VIEW COMPARISON:  CT pelvis 01/24/2020 FINDINGS: Moderate to severe degenerative left hip arthropathy with degenerative subcortical cyst formation is specially in the femoral head, as well as loss of articular space and mild subcortical sclerosis. These findings were also present on the 01/24/2020 exam and accordingly are not felt to be acute. There is some mild spurring of the left femoral head laterally, likewise stable. I not see an obvious new cortical discontinuity involving the left proximal femur to indicate a new femoral fracture. Right total hip prosthesis with the femoral head component reasonably aligned with the acetabular shell component on this single view. There is slightly less space between the femoral head component in the shell  component superiorly but this is probably from polymer wear, and the same appearance was present on 01/24/2020. Bony lucency below the right acetabulum compatible with particulate disease, unchanged from 01/24/2020. The arcuate lines of the sacrum appear continuous, as expected. No discrete fracture identified. IMPRESSION: 1. 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 2. No new fracture identified. 3. Right total hip prosthesis. Stable lucency below the acetabular shell component is likely a manifestation of particulate disease. Electronically Signed   By: Van Clines M.D.   On: 04/12/2020 12:15     CBC Recent Labs  Lab 04/12/20 1411 04/13/20 0439  WBC 3.5* 6.7  HGB 12.1 11.7*  HCT 38.1 38.6  PLT 52* 56*  MCV 96.9 99.7  MCH 30.8 30.2  MCHC 31.8 30.3  RDW 16.4* 16.4*  LYMPHSABS 0.5*  --   MONOABS 0.2  --   EOSABS 0.1  --   BASOSABS 0.0  --     Chemistries  Recent Labs  Lab 04/08/20 1519 04/12/20 1411 04/13/20 0439  NA 138 133* 132*  K 4.6 4.2 4.2  CL 99 99 100  CO2 23 25 20*  GLUCOSE 93 98 118*  BUN 12 12 13   CREATININE 1.00 1.06* 0.82  CALCIUM 8.8 8.9 8.8*  AST  --  60* 64*  ALT  --  25 26  ALKPHOS  --  88 86  BILITOT  --  5.9* 5.6*   ------------------------------------------------------------------------------------------------------------------ No results for input(s): CHOL, HDL, LDLCALC, TRIG, CHOLHDL, LDLDIRECT in the last 72 hours.  No results found for: HGBA1C ------------------------------------------------------------------------------------------------------------------ No results for input(s): TSH, T4TOTAL, T3FREE, THYROIDAB in the last 72 hours.  Invalid input(s): FREET3 ------------------------------------------------------------------------------------------------------------------ No results for input(s): VITAMINB12, FOLATE, FERRITIN, TIBC, IRON, RETICCTPCT in the last 72 hours.  Coagulation  profile No results for input(s): INR, PROTIME in the last 168 hours.  Recent Labs    04/12/20 2119  DDIMER 9.23*    Cardiac Enzymes No results for input(s): CKMB, TROPONINI, MYOGLOBIN in the last 168 hours.  Invalid input(s): CK ------------------------------------------------------------------------------------------------------------------    Component Value Date/Time   BNP 281.0 (H) 02/19/2020 0973     Roxan Hockey M.D on 04/13/2020 at 4:00 PM  Go to [email or web address] - for contact info  [facility] - Office  [phone]

## 2020-04-13 NOTE — Plan of Care (Signed)

## 2020-04-13 NOTE — Evaluation (Signed)
Physical Therapy Evaluation Patient Details Name: Samantha [name] MRN: [id number] DOB: [birth year] Today's Date: 04/13/2020   History of Present Illness  Samantha [name] is a 70 y.o. female with medical history significant of liver cirrhosis with history of GI bleed, CAD with history of CABG in 2001, CKD stage IIIa, hypertension, hyperlipidemia, paroxysmal a flutter of, chronic LBBB, chronic systolic CHF with EF 74-08% who comes into the hospital with severe back pain after having a fall at home.  Patient tripped and fell, states that it was a mechanical fall, denies any loss of consciousness, denies any lightheadedness or dizziness.  Denies any chest pain or palpitations at the time or prior to the fall.  She complains of chronic shortness of breath, not worse than her baseline.  She is on oxygen at home.  She reports that her weight has been going up and down, and has been having increased weight gain for which her diuretics have been changed and currently reports being on torsemide 40 mg twice daily.  States that her legs are always swollen but denies any significant fluid overload or weight gain recently.  While in the ED she had a transient episode of chest pressure, tells me she had those in the past and this episode was different than her cardiac chest pain.    Clinical Impression  Pt admitted with above diagnosis. Patient reports having pain medicine 2 hours prior to PT evaluation. Patient requires extra time and assistance for bed mobility and use of bedrails. Patient was able to move bilateral lower extremities in heel slides with min to mod assist. Patient refusing sitting up at edge of bed, transfers or ambulation attempt this date due to pain. Pt currently with functional limitations due to the deficits listed below (see PT Problem List). Pt will benefit from skilled PT to increase their independence and safety with mobility to allow discharge to the venue listed below.       Follow Up  Recommendations SNF;Supervision/Assistance - 24 hour    Equipment Recommendations  None recommended by PT    Recommendations for Other Services       Precautions / Restrictions Precautions Precautions: Fall Restrictions Weight Bearing Restrictions: No      Mobility  Bed Mobility Overal bed mobility: Needs Assistance Bed Mobility: Rolling;Sidelying to Sit Rolling: Supervision Sidelying to sit:  (refused)       General bed mobility comments: use of bedrails    Transfers   General transfer comment: refused  Ambulation/Gait  General Gait Details: unable  Stairs      Wheelchair Mobility    Modified Rankin (Stroke Patients Only)       Balance          Pertinent Vitals/Pain Pain Assessment: 0-10 Pain Score: 7  Pain Location: low back fracture site; pain increases with any movement Pain Descriptors / Indicators: Sharp Pain Intervention(s): Limited activity within patient's tolerance;Monitored during session    Home Living Family/patient expects to be discharged to:: Private residence Living Arrangements: Other relatives Available Help at Discharge: Family (brother and sister-in-law) Type of Home: House Home Access: Stairs to enter   Technical brewer of Steps: 1 Home Layout: Multi-level;Able to live on main level with bedroom/bathroom Home Equipment: Toilet riser;Cane - quad;Cane - single point;Bedside commode;Shower seat;Hand held Tourist information centre manager - 4 wheels      Prior Function Level of Independence: Needs assistance   Gait / Transfers Assistance Needed: household distances with Rollator  ADL's / Homemaking Assistance Needed: BADLs independently; assistance  for IADLs  Comments: began living with brother after ill 01/2020; lived alone prior to that     Hand Dominance        Extremity/Trunk Assessment   Upper Extremity Assessment Upper Extremity Assessment: Generalized weakness    Lower Extremity Assessment Lower Extremity  Assessment: Generalized weakness;RLE deficits/detail;LLE deficits/detail RLE: Unable to fully assess due to pain LLE: Unable to fully assess due to pain       Communication   Communication: No difficulties  Cognition Arousal/Alertness: Awake/alert Behavior During Therapy: WFL for tasks assessed/performed Overall Cognitive Status: Within Functional Limits for tasks assessed        General Comments      Exercises     Assessment/Plan    PT Assessment Patient needs continued PT services  PT Problem List Decreased strength;Decreased activity tolerance;Pain;Decreased mobility       PT Treatment Interventions DME instruction;Gait training;Patient/family education;Functional mobility training;Therapeutic activities;Therapeutic exercise    PT Goals (Current goals can be found in the Care Plan section)  Acute Rehab PT Goals Patient Stated Goal: Decrease pain. PT Goal Formulation: With patient Time For Goal Achievement: 04/27/20 Potential to Achieve Goals: Fair    Frequency Min 2X/week   Barriers to discharge           AM-PAC PT "6 Clicks" Mobility  Outcome Measure Help needed turning from your back to your side while in a flat bed without using bedrails?: A Lot Help needed moving from lying on your back to sitting on the side of a flat bed without using bedrails?: Total Help needed moving to and from a bed to a chair (including a wheelchair)?: Total Help needed standing up from a chair using your arms (e.g., wheelchair or bedside chair)?: Total Help needed to walk in hospital room?: Total Help needed climbing 3-5 steps with a railing? : Total 6 Click Score: 7    End of Session   Activity Tolerance: Patient limited by pain Patient left: in bed;with call bell/phone within reach;with bed alarm set Nurse Communication: Mobility status PT Visit Diagnosis: History of falling (Z91.81);Muscle weakness (generalized) (M62.81);Difficulty in walking, not elsewhere classified  (R26.2)    Time: 1140-1210 PT Time Calculation (min) (ACUTE ONLY): 30 min   Charges:   PT Evaluation $PT Eval Low Complexity: 1 Low PT Treatments $Therapeutic Activity: 8-22 mins        Floria Raveling. Hartnett-Rands, MS, PT Per [place] #[id number] 04/13/2020, 12:17 PM

## 2020-04-13 NOTE — TOC Progression Note (Signed)
Transition of Care [facility]) - Progression Note    Patient Details  Name: Samantha [name] MRN: [id number] Date of Birth: January 11, 1951  Transition of Care [facility]) CM/SW Contact  Roda Shutters Margretta Sidle, RN Phone Number: 04/13/2020, 3:29 PM The Appropriate Use Committee has met to discuss this patient's plan of care to review recommendations for post-acute treatment options.  The options were reviewed with the attending MD, TOC, rehab services and the physician advisor.  All available documentation has been reviewed and the determination has been made that this patient does meet criteria for placement in a [place] for short-term rehab. A consult to the Transitions of Care Team has been made to discuss alternative options and discharge planning with the patient/family.

## 2020-04-13 NOTE — TOC Initial Note (Signed)
Transition of Care [facility]) - Initial/Assessment Note    Patient Details  Name: Samantha [name] MRN: [id number] Date of Birth: 23-Nov-1950  Transition of Care [facility]) CM/SW Contact:    Natasha Bence, LCSW Phone Number: 04/13/2020, 4:12 PM  Clinical Narrative:                 Patient is 70 year old female admitted for Compression fracture of L2 lumbar vertebra. CSW observed SNF recommendation and conducted intitial assessment. Patient reported that they were already with [facility]. TOC to follow.    Expected Discharge Plan: Home w Hospice Care Barriers to Discharge: Continued Medical Work up   Patient Goals and CMS Choice Patient states their goals for this hospitalization and ongoing recovery are:: Return home with Hospice CMS Medicare.gov Compare Post Acute Care list provided to:: Patient Choice offered to / list presented to : Patient  Expected Discharge Plan and Services Expected Discharge Plan: Home w Hospice Care In-house Referral: NA Discharge Planning Services: NA Post Acute Care Choice: NA Living arrangements for the past 2 months: Single Family Home                 DME Arranged: N/A DME Agency: NA       HH Arranged: NA [place] Agency: NA        Prior Living Arrangements/Services Living arrangements for the past 2 months: Single Family Home Lives with:: Self Patient language and need for interpreter reviewed:: Yes Do you feel safe going back to the place where you live?: Yes      Need for Family Participation in Patient Care: Yes (Comment) Care giver support system in place?: Yes (comment) Current home services: Hospice Criminal Activity/Legal Involvement Pertinent to Current Situation/Hospitalization: No - Comment as needed  Activities of Daily Living Home Assistive Devices/Equipment: Oxygen,Walker (specify type),Shower chair with back,Cane (specify quad or straight),Eyeglasses ADL Screening (condition at time of admission) Patient's cognitive  ability adequate to safely complete daily activities?: Yes Is the patient deaf or have difficulty hearing?: No Does the patient have difficulty seeing, even when wearing glasses/contacts?: No Does the patient have difficulty concentrating, remembering, or making decisions?: No Patient able to express need for assistance with ADLs?: No Does the patient have difficulty dressing or bathing?: No Independently performs ADLs?: Yes (appropriate for developmental age) Does the patient have difficulty walking or climbing stairs?: Yes Weakness of Legs: None Weakness of Arms/Hands: None  Permission Sought/Granted Permission sought to share information with : Family Supports Permission granted to share information with : Yes, Verbal Permission Granted     Permission granted to share info w AGENCY: [facility]        Emotional Assessment     Affect (typically observed): Accepting,Adaptable,Appropriate Orientation: : Oriented to Place,Oriented to Self,Oriented to Situation,Oriented to  Time Alcohol / Substance Use: Not Applicable Psych Involvement: No (comment)  Admission diagnosis:  Compression fracture of L2 lumbar vertebra (HCC) [S32.020A] Closed compression fracture of L2 lumbar vertebra with delayed healing, subsequent encounter [S32.020G] Patient Active Problem List   Diagnosis Date Noted  . Chronic respiratory failure with hypoxia -CHF  04/13/2020  . Compression fracture of L2 lumbar vertebra ([place]) 04/12/2020  . Cholestasis, intrahepatic 03/12/2020  . Lobar pneumonia ([place]) 02/18/2020  . Hepatic cirrhosis ([place])   . Hypokalemia   . Anasarca 02/14/2020  . Cirrhosis of liver with ascites ([place]) 02/14/2020  . Volume overload 02/13/2020  . Constipation 02/11/2020  . Peripheral edema 02/03/2020  . Thrombocytopenia ([place]) 01/24/2020  .  Left leg swelling 01/24/2020  . Hyperammonemia ([place]) 01/24/2020  . Prolonged QT interval 01/24/2020  . Elevated troponin 01/24/2020  .  Intractable nausea and vomiting 01/23/2020  . Atypical chest pain 08/06/2019  . GERD (gastroesophageal reflux disease) 08/06/2019  . Acute respiratory failure with hypoxia ([place]) 05/23/2019  . Abdominal pain 05/20/2019  . Intractable abdominal pain 05/19/2019  . Paroxysmal atrial fibrillation ([place]) 05/19/2019  . Anxiety with depression 05/19/2019  . Sinus arrhythmia 05/19/2019  . Iron deficiency anemia 02/05/2019  . Chronic combined systolic and diastolic CHF (congestive heart failure) ([place]) 04/17/2018  . Heart failure ([place]) 04/17/2018  . Osteoarthritis of spine with radiculopathy, cervical region 01/12/2018  . Paresthesia of both hands 12/14/2017  . Recurrent falls 12/14/2017  . Closed compression fracture of first lumbar vertebra ([place]) 08/23/2017  . Lumbar spondylosis with myelopathy 08/23/2017  . Carpal tunnel syndrome of right wrist 04/05/2017  . Rectal pain 03/06/2017  . Influenza A (H1N1) 02/22/2017  . Rectal bleeding 02/22/2017  . Closed fracture of lower end of right radius with routine healing 02/20/2017  . Injury of triangular fibrocartilage complex of right wrist 02/20/2017  . Pain 02/10/2017  . Other cirrhosis of liver ([place]) 10/26/2016  . Persistent atrial fibrillation 08/16/2016  . Cardiomyopathy, ischemic-35-40% by cath  01/11/2015  . Obesity 09/24/2013  . Unspecified hereditary and idiopathic peripheral neuropathy 01/10/2013  . Hereditary and idiopathic peripheral neuropathy 01/10/2013  . Cirrhosis, non-alcoholic ([place]) 63/81/7711  . Esophageal varices- Plavix stopped 12/23/2011  . GI bleed 12/23/2011  . Idiopathic esophageal varices without bleeding ([place]) 12/23/2011  . Anemia 04/22/2011  . CAD (coronary artery disease) 08/21/2009  . Mixed hyperlipidemia 06/04/2008  . Essential hypertension 06/04/2008  . S/P CABG x 3- 2001 06/04/2008   PCP:  [name], MD Pharmacy:   [id number] [place] [place]Phone: [phone]: [phone] [place] [place] [address] [place] [address] [id number]: [phone]: [phone]    Social Determinants of Health (SDOH) Interventions    Readmission Risk Interventions Readmission Risk Prevention Plan 02/19/2020  PCP or Specialist Appt within 3-5 Days Complete  Some recent data might be hidden

## 2020-04-13 NOTE — Plan of Care (Signed)
  Problem: Acute Rehab PT Goals(only PT should resolve) Goal: Pt will Roll Supine to Side Outcome: Progressing Flowsheets (Taken 04/13/2020 1220) Pt will Roll Supine to Side:  with supervision  with rail Goal: Pt Will Go Supine/Side To Sit Outcome: Progressing Flowsheets (Taken 04/13/2020 1220) Pt will go Supine/Side to Sit: with moderate assist Goal: Pt Will Go Sit To Supine/Side Outcome: Progressing Flowsheets (Taken 04/13/2020 1220) Pt will go Sit to Supine/Side: with moderate assist Goal: Patient Will Transfer Sit To/From Stand Outcome: Progressing Flowsheets (Taken 04/13/2020 1220) Patient will transfer sit to/from stand: with maximum assist   Pamala Hurry D. Hartnett-Rands, MS, PT Per [place] [phone] 04/13/2020

## 2020-04-13 NOTE — Care Management Obs Status (Signed)
[place] NOTIFICATION   Patient Details  Name: SHATAVIA SANTOR MRN: [id number] Date of Birth: 1951-01-15   Medicare Observation Status Notification Given:  Yes    Natasha Bence, LCSW 04/13/2020, 4:23 PM

## 2020-04-14 DIAGNOSIS — I10 Essential (primary) hypertension: Secondary | ICD-10-CM | POA: Diagnosis not present

## 2020-04-14 DIAGNOSIS — I255 Ischemic cardiomyopathy: Secondary | ICD-10-CM | POA: Diagnosis present

## 2020-04-14 DIAGNOSIS — I2510 Atherosclerotic heart disease of native coronary artery without angina pectoris: Secondary | ICD-10-CM | POA: Diagnosis present

## 2020-04-14 DIAGNOSIS — Z8719 Personal history of other diseases of the digestive system: Secondary | ICD-10-CM | POA: Diagnosis not present

## 2020-04-14 DIAGNOSIS — Z951 Presence of aortocoronary bypass graft: Secondary | ICD-10-CM | POA: Diagnosis not present

## 2020-04-14 DIAGNOSIS — I5042 Chronic combined systolic (congestive) and diastolic (congestive) heart failure: Secondary | ICD-10-CM | POA: Diagnosis present

## 2020-04-14 DIAGNOSIS — R269 Unspecified abnormalities of gait and mobility: Secondary | ICD-10-CM | POA: Diagnosis present

## 2020-04-14 DIAGNOSIS — Z20822 Contact with and (suspected) exposure to covid-19: Secondary | ICD-10-CM | POA: Diagnosis present

## 2020-04-14 DIAGNOSIS — I4892 Unspecified atrial flutter: Secondary | ICD-10-CM | POA: Diagnosis not present

## 2020-04-14 DIAGNOSIS — I4819 Other persistent atrial fibrillation: Secondary | ICD-10-CM | POA: Diagnosis not present

## 2020-04-14 DIAGNOSIS — K7469 Other cirrhosis of liver: Secondary | ICD-10-CM | POA: Diagnosis not present

## 2020-04-14 DIAGNOSIS — I130 Hypertensive heart and chronic kidney disease with heart failure and stage 1 through stage 4 chronic kidney disease, or unspecified chronic kidney disease: Secondary | ICD-10-CM | POA: Diagnosis present

## 2020-04-14 DIAGNOSIS — E782 Mixed hyperlipidemia: Secondary | ICD-10-CM | POA: Diagnosis not present

## 2020-04-14 DIAGNOSIS — K766 Portal hypertension: Secondary | ICD-10-CM | POA: Diagnosis present

## 2020-04-14 DIAGNOSIS — R262 Difficulty in walking, not elsewhere classified: Secondary | ICD-10-CM | POA: Diagnosis not present

## 2020-04-14 DIAGNOSIS — E871 Hypo-osmolality and hyponatremia: Secondary | ICD-10-CM | POA: Diagnosis not present

## 2020-04-14 DIAGNOSIS — R296 Repeated falls: Secondary | ICD-10-CM | POA: Diagnosis present

## 2020-04-14 DIAGNOSIS — E785 Hyperlipidemia, unspecified: Secondary | ICD-10-CM | POA: Diagnosis present

## 2020-04-14 DIAGNOSIS — M4722 Other spondylosis with radiculopathy, cervical region: Secondary | ICD-10-CM | POA: Diagnosis not present

## 2020-04-14 DIAGNOSIS — Z66 Do not resuscitate: Secondary | ICD-10-CM | POA: Diagnosis present

## 2020-04-14 DIAGNOSIS — W010XXA Fall on same level from slipping, tripping and stumbling without subsequent striking against object, initial encounter: Secondary | ICD-10-CM | POA: Diagnosis present

## 2020-04-14 DIAGNOSIS — S32020A Wedge compression fracture of second lumbar vertebra, initial encounter for closed fracture: Secondary | ICD-10-CM | POA: Diagnosis not present

## 2020-04-14 DIAGNOSIS — D61818 Other pancytopenia: Secondary | ICD-10-CM | POA: Diagnosis present

## 2020-04-14 DIAGNOSIS — S32020G Wedge compression fracture of second lumbar vertebra, subsequent encounter for fracture with delayed healing: Secondary | ICD-10-CM | POA: Diagnosis not present

## 2020-04-14 DIAGNOSIS — Z8701 Personal history of pneumonia (recurrent): Secondary | ICD-10-CM | POA: Diagnosis not present

## 2020-04-14 DIAGNOSIS — N1831 Chronic kidney disease, stage 3a: Secondary | ICD-10-CM | POA: Diagnosis present

## 2020-04-14 DIAGNOSIS — K7460 Unspecified cirrhosis of liver: Secondary | ICD-10-CM | POA: Diagnosis present

## 2020-04-14 DIAGNOSIS — J9611 Chronic respiratory failure with hypoxia: Secondary | ICD-10-CM | POA: Diagnosis present

## 2020-04-14 DIAGNOSIS — S32000A Wedge compression fracture of unspecified lumbar vertebra, initial encounter for closed fracture: Secondary | ICD-10-CM | POA: Diagnosis not present

## 2020-04-14 DIAGNOSIS — Z90710 Acquired absence of both cervix and uterus: Secondary | ICD-10-CM | POA: Diagnosis not present

## 2020-04-14 DIAGNOSIS — F32A Depression, unspecified: Secondary | ICD-10-CM | POA: Diagnosis present

## 2020-04-14 DIAGNOSIS — Y9301 Activity, walking, marching and hiking: Secondary | ICD-10-CM | POA: Diagnosis present

## 2020-04-14 DIAGNOSIS — K219 Gastro-esophageal reflux disease without esophagitis: Secondary | ICD-10-CM | POA: Diagnosis present

## 2020-04-14 DIAGNOSIS — M6281 Muscle weakness (generalized): Secondary | ICD-10-CM | POA: Diagnosis not present

## 2020-04-14 LAB — CBC
HCT: 37.7 % (ref 36.0–46.0)
Hemoglobin: 11.9 g/dL — ABNORMAL LOW (ref 12.0–15.0)
MCH: 30.5 pg (ref 26.0–34.0)
MCHC: 31.6 g/dL (ref 30.0–36.0)
MCV: 96.7 fL (ref 80.0–100.0)
Platelets: 71 10*3/uL — ABNORMAL LOW (ref 150–400)
RBC: 3.9 MIL/uL (ref 3.87–5.11)
RDW: 16.2 % — ABNORMAL HIGH (ref 11.5–15.5)
WBC: 7.5 10*3/uL (ref 4.0–10.5)
nRBC: 0 % (ref 0.0–0.2)

## 2020-04-14 LAB — BASIC METABOLIC PANEL
Anion gap: 9 (ref 5–15)
BUN: 16 mg/dL (ref 8–23)
CO2: 24 mmol/L (ref 22–32)
Calcium: 9.2 mg/dL (ref 8.9–10.3)
Chloride: 98 mmol/L (ref 98–111)
Creatinine, Ser: 1.18 mg/dL — ABNORMAL HIGH (ref 0.44–1.00)
GFR, Estimated: 49 mL/min — ABNORMAL LOW (ref >60)
Glucose, Bld: 97 mg/dL (ref 70–99)
Potassium: 4.1 mmol/L (ref 3.5–5.1)
Sodium: 131 mmol/L — ABNORMAL LOW (ref 135–145)

## 2020-04-14 NOTE — NC FL2 (Signed)
[place] LEVEL OF CARE SCREENING TOOL     IDENTIFICATION  Patient Name: Samantha [name] Birthdate: [birth year] Sex: female Admission Date (Current Location): 04/12/2020  [facility] and [place] Number:  [employer] and Address:  [place] [address] [place]      Provider Number: [id number]  Attending Physician Name and Address:  Roxan Hockey, MD  Relative Name and Phone Number:  Meyer Russel (Other)   [phone]    Current Level of Care: Other (Comment) (observation) Recommended Level of Care: [place] Prior Approval Number:    Date Approved/Denied:   PASRR Number: [id number]  Discharge Plan: SNF    Current Diagnoses: Patient Active Problem List   Diagnosis Date Noted  . Chronic respiratory failure with hypoxia -CHF  04/13/2020  . Compression fracture of L2 lumbar vertebra ([place]) 04/12/2020  . Cholestasis, intrahepatic 03/12/2020  . Lobar pneumonia ([place]) 02/18/2020  . Hepatic cirrhosis ([place])   . Hypokalemia   . Anasarca 02/14/2020  . Cirrhosis of liver with ascites ([place]) 02/14/2020  . Volume overload 02/13/2020  . Constipation 02/11/2020  . Peripheral edema 02/03/2020  . Thrombocytopenia ([place]) 01/24/2020  . Left leg swelling 01/24/2020  . Hyperammonemia ([place]) 01/24/2020  . Prolonged QT interval 01/24/2020  . Elevated troponin 01/24/2020  . Intractable nausea and vomiting 01/23/2020  . Atypical chest pain 08/06/2019  . GERD (gastroesophageal reflux disease) 08/06/2019  . Acute respiratory failure with hypoxia ([place]) 05/23/2019  . Abdominal pain 05/20/2019  . Intractable abdominal pain 05/19/2019  . Paroxysmal atrial fibrillation ([place]) 05/19/2019  . Anxiety with depression 05/19/2019  . Sinus arrhythmia 05/19/2019  . Iron deficiency anemia 02/05/2019  . Chronic combined systolic and diastolic CHF (congestive heart failure) ([place]) 04/17/2018  . Heart failure ([place]) 04/17/2018  .  Osteoarthritis of spine with radiculopathy, cervical region 01/12/2018  . Paresthesia of both hands 12/14/2017  . Recurrent falls 12/14/2017  . Closed compression fracture of first lumbar vertebra ([place]) 08/23/2017  . Lumbar spondylosis with myelopathy 08/23/2017  . Carpal tunnel syndrome of right wrist 04/05/2017  . Rectal pain 03/06/2017  . Influenza A (H1N1) 02/22/2017  . Rectal bleeding 02/22/2017  . Closed fracture of lower end of right radius with routine healing 02/20/2017  . Injury of triangular fibrocartilage complex of right wrist 02/20/2017  . Pain 02/10/2017  . Other cirrhosis of liver ([place]) 10/26/2016  . Persistent atrial fibrillation 08/16/2016  . Cardiomyopathy, ischemic-35-40% by cath  01/11/2015  . Obesity 09/24/2013  . Unspecified hereditary and idiopathic peripheral neuropathy 01/10/2013  . Hereditary and idiopathic peripheral neuropathy 01/10/2013  . Cirrhosis, non-alcoholic ([place]) 33/35/4562  . Esophageal varices- Plavix stopped 12/23/2011  . GI bleed 12/23/2011  . Idiopathic esophageal varices without bleeding ([place]) 12/23/2011  . Anemia 04/22/2011  . CAD (coronary artery disease) 08/21/2009  . Mixed hyperlipidemia 06/04/2008  . Essential hypertension 06/04/2008  . S/P CABG x 3- 2001 06/04/2008    Orientation RESPIRATION BLADDER Height & Weight     Self,Time,Situation,Place  O2 (2L) Continent Weight: 165 lb (74.8 kg) Height:  5' 2"  (157.5 cm)  BEHAVIORAL SYMPTOMS/MOOD NEUROLOGICAL BOWEL NUTRITION STATUS      Continent Diet (Diet 2 gram sodium)  AMBULATORY STATUS COMMUNICATION OF NEEDS Skin   Extensive Assist Verbally Normal                       Personal Care Assistance Level of Assistance  Bathing,Feeding,Dressing Bathing Assistance: Limited assistance Feeding assistance: Independent  Dressing Assistance: Limited assistance     Functional Limitations Info  Sight,Speech,Hearing Sight Info: Adequate Hearing Info: Adequate Speech Info: Adequate     SPECIAL CARE FACTORS FREQUENCY  PT (By licensed PT)     PT Frequency: 5x/week              Contractures Contractures Info: Not present    Additional Factors Info  Code Status,Allergies Code Status Info: DNR Allergies Info: Entresto, Doxycycline, Acetaminophen, ace inhibitors, cefaclor, cephalexin, penicillins, pregabalin, tape           Current Medications (04/14/2020):  This is the current hospital active medication list Current Facility-Administered Medications  Medication Dose Route Frequency Provider Last Rate Last Admin  . 0.9 %  sodium chloride infusion   Intravenous Continuous Emokpae, Courage, MD 10 mL/hr at 04/13/20 1800 Rate Change at 04/13/20 1800  . albuterol (VENTOLIN HFA) 108 (90 Base) MCG/ACT inhaler 2 puff  2 puff Inhalation Q6H PRN Caren Griffins, MD      . cyclobenzaprine (FLEXERIL) tablet 10 mg  10 mg Oral TID PRN Caren Griffins, MD      . ezetimibe (ZETIA) tablet 10 mg  10 mg Oral QHS Caren Griffins, MD   10 mg at 04/14/20 0022  . FLUoxetine (PROZAC) capsule 20 mg  20 mg Oral Daily Caren Griffins, MD   20 mg at 04/14/20 0940  . HYDROmorphone (DILAUDID) injection 0.5 mg  0.5 mg Intravenous Q4H PRN Caren Griffins, MD   0.5 mg at 04/14/20 1053  . lidocaine (LIDODERM) 5 % 1 patch  1 patch Transdermal Q24H Caren Griffins, MD   1 patch at 04/13/20 1629  . methocarbamol (ROBAXIN) tablet 500 mg  500 mg Oral TID Roxan Hockey, MD   500 mg at 04/14/20 0940  . metoprolol tartrate (LOPRESSOR) tablet 12.5 mg  12.5 mg Oral BID Caren Griffins, MD   12.5 mg at 04/14/20 0940  . nitroGLYCERIN (NITROLINGUAL) 0.4 MG/SPRAY spray 1 spray  1 spray Sublingual Q5 Min x 3 PRN Caren Griffins, MD      . oxyCODONE (Oxy IR/ROXICODONE) immediate release tablet 5-10 mg  5-10 mg Oral Q4H PRN Caren Griffins, MD   5 mg at 04/14/20 0759  . spironolactone (ALDACTONE) tablet 25 mg  25 mg Oral Daily Caren Griffins, MD   25 mg at 04/14/20 0940     Discharge  Medications: Please see discharge summary for a list of discharge medications.  Relevant Imaging Results:  Relevant Lab Results:   Additional Information SSN [id number], Clydene Pugh, LCSW

## 2020-04-14 NOTE — Progress Notes (Signed)
Physical Therapy Treatment Patient Details Name: Samantha [name] MRN: [id number] DOB: 14-Feb-1951 Today's Date: 04/14/2020    History of Present Illness Samantha [name] is a 70 y.o. female with medical history significant of liver cirrhosis with history of GI bleed, CAD with history of CABG in 2001, CKD stage IIIa, hypertension, hyperlipidemia, paroxysmal a flutter of, chronic LBBB, chronic systolic CHF with EF 16-07% who comes into the hospital with severe back pain after having a fall at home.  Patient tripped and fell, states that it was a mechanical fall, denies any loss of consciousness, denies any lightheadedness or dizziness.  Denies any chest pain or palpitations at the time or prior to the fall.  She complains of chronic shortness of breath, not worse than her baseline.  She is on oxygen at home.  She reports that her weight has been going up and down, and has been having increased weight gain for which her diuretics have been changed and currently reports being on torsemide 40 mg twice daily.  States that her legs are always swollen but denies any significant fluid overload or weight gain recently.  While in the ED she had a transient episode of chest pressure, tells me she had those in the past and this episode was different than her cardiac chest pain.    PT Comments    Patient demonstrates increased tolerance for functional activity, fair/good return for rolling to side and sitting up from side lying position with use of bed rail, and able to take a few steps at bedside before having to sit due to c/o increasing low back pain.  Patient tolerated sitting up in chair after therapy - nursing staff notified.  Patient will benefit from continued physical therapy in hospital and recommended venue below to increase strength, balance, endurance for safe ADLs and gait.   Follow Up Recommendations  SNF;Supervision for mobility/OOB;Supervision - Intermittent     Equipment Recommendations  None  recommended by PT    Recommendations for Other Services       Precautions / Restrictions Precautions Precautions: Fall Restrictions Weight Bearing Restrictions: No    Mobility  Bed Mobility Overal bed mobility: Needs Assistance Bed Mobility: Rolling;Sidelying to Sit Rolling: Min assist;Mod assist         General bed mobility comments: increased time, labored movement, required use of bed rails    Transfers Overall transfer level: Needs assistance Equipment used: Rolling walker (2 wheeled) Transfers: Sit to/from [facility] Sit to Stand: Min assist;Mod assist Stand pivot transfers: Min assist;Mod assist       General transfer comment: slow labored movement  Ambulation/Gait Ambulation/Gait assistance: Mod assist Gait Distance (Feet): 6 Feet Assistive device: Rolling walker (2 wheeled) Gait Pattern/deviations: Decreased step length - right;Decreased step length - left;Decreased stride length Gait velocity: decreased   General Gait Details: limited to 5-6 slow labored unsteady steps at bedside due to increasing low back pain   Stairs             Wheelchair Mobility    Modified Rankin (Stroke Patients Only)       Balance Overall balance assessment: Needs assistance Sitting-balance support: Feet supported;No upper extremity supported Sitting balance-Leahy Scale: Fair Sitting balance - Comments: fair/good seated at EOB   Standing balance support: During functional activity;Bilateral upper extremity supported Standing balance-Leahy Scale: Fair Standing balance comment: using RW  Cognition Arousal/Alertness: Awake/alert Behavior During Therapy: WFL for tasks assessed/performed Overall Cognitive Status: Within Functional Limits for tasks assessed                                        Exercises General Exercises - Lower Extremity Ankle Circles/Pumps: Seated;AROM;Strengthening;10  reps;Both Long Arc Quad: Seated;AROM;Strengthening;Both;10 reps Hip Flexion/Marching: Seated;AROM;Strengthening;Both;5 reps    General Comments        Pertinent Vitals/Pain Pain Assessment: Faces Pain Score: 8  Pain Location: low back fracture site; pain increases with any movement Pain Descriptors / Indicators: Sharp;Grimacing;Guarding Pain Intervention(s): Limited activity within patient's tolerance;Monitored during session;Repositioned;Premedicated before session    Home Living                      Prior Function            PT Goals (current goals can now be found in the care plan section) Acute Rehab PT Goals Patient Stated Goal: Decrease pain. PT Goal Formulation: With patient Time For Goal Achievement: 04/27/20 Potential to Achieve Goals: Good Progress towards PT goals: Progressing toward goals    Frequency    Min 3X/week      PT Plan Current plan remains appropriate    Co-evaluation              AM-PAC PT "6 Clicks" Mobility   Outcome Measure  Help needed turning from your back to your side while in a flat bed without using bedrails?: A Lot Help needed moving from lying on your back to sitting on the side of a flat bed without using bedrails?: A Lot Help needed moving to and from a bed to a chair (including a wheelchair)?: A Lot Help needed standing up from a chair using your arms (e.g., wheelchair or bedside chair)?: A Lot Help needed to walk in hospital room?: A Lot Help needed climbing 3-5 steps with a railing? : Total 6 Click Score: 11    End of Session Equipment Utilized During Treatment: Oxygen Activity Tolerance: Patient tolerated treatment well;Patient limited by fatigue Patient left: in chair;with call bell/phone within reach Nurse Communication: Mobility status PT Visit Diagnosis: History of falling (Z91.81);Muscle weakness (generalized) (M62.81);Difficulty in walking, not elsewhere classified (R26.2);Unsteadiness on feet  (R26.81)     Time: 3007-6226 PT Time Calculation (min) (ACUTE ONLY): 26 min  Charges:  $Therapeutic Exercise: 8-22 mins $Therapeutic Activity: 8-22 mins                     3:43 PM, 04/14/20 Lonell Grandchild, MPT Physical Therapist with [facility] 336 [phone] office [phone] mobile phone

## 2020-04-14 NOTE — Progress Notes (Signed)
Patient Demographics:    Samantha [name], is a 70 y.o. female, DOB - 02-11-1951, TOI:[id number]  Admit date - 04/12/2020   Admitting Physician Elzora Cullins Denton Brick, MD  Outpatient Primary MD for the patient is Sasser, Silvestre Moment, MD  LOS - 0   Chief Complaint  Patient presents with  . Fall        Subjective:    Samantha [name] today has no fevers, no emesis,  No chest pain,   Complains of persistent low back pain -  Assessment  & Plan :    Principal Problem:   Compression fracture of L2 lumbar vertebra (HCC) Active Problems:   Paroxysmal atrial fibrillation (HCC)   Chronic respiratory failure with hypoxia -CHF    S/P CABG x 3- 2001   Cirrhosis, non-alcoholic (HCC)   Cardiomyopathy, ischemic-35-40% by cath    Recurrent falls   Lumbar compression fracture, closed, initial encounter [facility])  Brief Summary:- 70 y.o. female with medical history significant of liver cirrhosis with history of GI bleed, CAD with history of CABG in 2001, CKD stage IIIa, hypertension, hyperlipidemia, paroxysmal a flutter of, chronic LBBB, chronic systolic CHF with EF 83-38%  A/p 1)Status post fall with new L2 compression fracture with ambulatory dysfunction--- PT recommends SNF -MRI of Lumbar Spine--- MRI findings indicates that pt is Not a candidate for kyphoplasty/vertebroplasty (Mild L1 and L2 compression fractures are new since December 16--- does not appear to be very acute) -Continue opiates and muscle relaxants -Transfer to SNF rehab for ongoing physical therapy  2)HFrEF-- history of ischemic cardiomyopathy --- last known EF 35 to 25%, chronic systolic dysfunction CHF -No evidence of flareup at this time appears euvolemic and compensated at this time -Continue Aldactone 25 mg daily, and metoprolol -Patient has intolerance to Entresto -No aspirin due to chronic anemia and thrombocytopenia with GI bleed  3) liver cirrhosis  with pancytopenia--- no bleeding noted at this time, patient does have history of prior GI bleed, monitor closely  4)PAF--- continue metoprolol for rate control, not a candidate for anticoagulation due to history of GI bleed and chronic thrombocytopenia and anemia  5) chronic hypoxic respiratory failure--- continue home O2, no acute decompensation at this time  6) depression and anxiety--Prozac 20 mg daily,  7)Recurrent falls/Disposition---PTA pt lived alone and did very poorly, and then patient moved in with her brother and sister-in-law--still had a fall  patient has significant limitations with mobility related ADLs- this patient needs to continue to be monitored in the hospital until a SNF bed is obtained as she is not safe to go home with her current physcical limitations   Disposition/Need for in-Hospital Stay- patient unable to be discharged at this time due to -- status post fall with back pain and Lumbar fracture -requiring IV medication for pain control, unsafe discharge due to fall risk awaiting SNF placement -Please see # 7 above  Disposition: The patient is from: Home              Anticipated d/c is to: SNF              Anticipated d/c date is: 2 days              Patient currently is not medically stable to d/c. Barriers:  Not Clinically Stable-   Code Status :  -  Code Status: DNR   Family Communication:    NA (patient is alert, awake and coherent)   Consults  :    DVT Prophylaxis  :   - SCDs SCDs Start: 04/12/20 1931    Lab Results  Component Value Date   PLT 71 (L) 04/14/2020    Inpatient Medications  Scheduled Meds: . ezetimibe  10 mg Oral QHS  . FLUoxetine  20 mg Oral Daily  . lidocaine  1 patch Transdermal Q24H  . methocarbamol  500 mg Oral TID  . metoprolol tartrate  12.5 mg Oral BID  . spironolactone  25 mg Oral Daily   Continuous Infusions: . sodium chloride 10 mL/hr at 04/13/20 1800   PRN Meds:.albuterol, cyclobenzaprine, HYDROmorphone  (DILAUDID) injection, nitroGLYCERIN, oxyCODONE    Anti-infectives (From admission, onward)   None        Objective:   Vitals:   04/13/20 0944 04/13/20 2020 04/14/20 0500 04/14/20 1552  BP: (!) 106/54 (!) 100/41 100/62 (!) 90/54  Pulse: 98 98 100 96  Resp:  18 16   Temp:  99.2 F (37.3 C) 99.2 F (37.3 C) 98.4 F (36.9 C)  TempSrc:  Oral  Oral  SpO2:  90% 92% 94%  Weight:      Height:        Wt Readings from Last 3 Encounters:  04/12/20 74.8 kg  04/08/20 77.7 kg  03/12/20 71.1 kg     Intake/Output Summary (Last 24 hours) at 04/14/2020 1622 Last data filed at 04/14/2020 1509 Gross per 24 hour  Intake 2923.27 ml  Output 500 ml  Net 2423.27 ml     Physical Exam  Gen:- Awake Alert, no acute distress HEENT:- [place].AT, No sclera icterus Neck-Supple Neck,No JVD,.  Lungs-no wheezing, fair symmetrical air movement CV- S1, S2 normal, irregularly irregular Abd-  +ve B.Sounds, Abd Soft, No tenderness,    Extremity/Skin:- No  edema, pedal pulses present  Psych-affect is appropriate, oriented x3 Neuro-generalized weakness no new focal deficits, no tremors MSK-mobility related challenges especially along the lumbar spine area   Data Review:   Micro Results Recent Results (from the past 240 hour(s))  SARS CORONAVIRUS 2 (TAT 6-24 HRS) Nasopharyngeal Nasopharyngeal Swab     Status: None   Collection Time: 04/12/20  2:16 PM   Specimen: Nasopharyngeal Swab  Result Value Ref Range Status   SARS Coronavirus 2 NEGATIVE NEGATIVE Final    Comment: (NOTE) SARS-CoV-2 target nucleic acids are NOT DETECTED.  The SARS-CoV-2 RNA is generally detectable in upper and lower respiratory specimens during the acute phase of infection. Negative results do not preclude SARS-CoV-2 infection, do not rule out co-infections with other pathogens, and should not be used as the sole basis for treatment or other patient management decisions. Negative results must be combined with clinical  observations, patient history, and epidemiological information. The expected result is Negative.  Fact Sheet for Patients: SugarRoll.be  Fact Sheet for Healthcare Providers: https://www.woods-mathews.com/  This test is not yet approved or cleared by the [place] FDA and  has been authorized for detection and/or diagnosis of SARS-CoV-2 by FDA under an Emergency Use Authorization (EUA). This EUA will remain  in effect (meaning this test can be used) for the duration of the COVID-19 declaration under Se ction 564(b)(1) of the Act, 21 U.S.C. section 360bbb-3(b)(1), unless the authorization is terminated or revoked sooner.  Performed at [facility], [place] [address] [place], [place]  [address]     Radiology Reports DG Lumbar Spine Complete  Result Date: 04/12/2020 CLINICAL DATA:  Fall today, low back and hip pain EXAM: LUMBAR SPINE - COMPLETE 4+ VIEW COMPARISON:  02/23/2020 CT scan and 04/25/2006 radiographs FINDINGS: Approximately 50% L1 compression fracture with vertebral augmentation similar to the 02/13/2020 exam. New mild superior endplate compression fracture at L2, with concavity of the superior endplate best depicted on the lateral projection and not previously present on 02/13/2020. Interbody cage devices at L4-5 and L5-S1 not appreciably changed. The facet joints are absent at L4-5 and L5-S1 Bony demineralization. IMPRESSION: 1. New mild superior endplate compression fracture at L2 with concavity of the superior endplate. 2. Stable old L1 compression fracture with vertebral augmentation. 3. Interbody cage devices at L4-5 and L5-S1 not appreciably changed. 4. Bony demineralization, overall appearance favors osteoporosis. Electronically Signed   By: Van Clines M.D.   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 These findings were also present on the 01/24/2020 exam and accordingly are not felt to be acute. There is some mild spurring of the left femoral head laterally, likewise stable. I not see an obvious new cortical discontinuity involving the left proximal femur to indicate a new femoral fracture. Right total hip prosthesis with the femoral head component reasonably aligned with the acetabular shell component on this single view. There is slightly less space between the femoral head component in the shell component superiorly but this is probably from polymer wear, and the same appearance was present on 01/24/2020. Bony lucency below the right acetabulum compatible with particulate disease, unchanged from 01/24/2020. The arcuate lines of the sacrum appear continuous, as expected. No discrete fracture identified. IMPRESSION: 1. 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 2. No new fracture identified. 3. Right total hip prosthesis. Stable lucency below the acetabular shell component is likely a manifestation of particulate disease. Electronically Signed   By: Van Clines M.D.   On: 04/12/2020 12:15   MR LUMBAR SPINE WO CONTRAST  Result Date: 04/13/2020 CLINICAL DATA:  Low back pain down both legs EXAM: MRI LUMBAR SPINE WITHOUT CONTRAST TECHNIQUE: Multiplanar, multisequence MR imaging of the lumbar spine was performed. No intravenous contrast was administered. COMPARISON:  No recent MR imaging. Prior MRI is from 2011. Correlation made with CT from December 2021 FINDINGS: Motion artifact is present. Segmentation: Standard. Alignment:  Trace retrolisthesis at L1-L2. Vertebrae: Chronic L1 compression fracture with evidence of cement augmentation. New mild  loss of height at the superior and inferior endplates of L2 and eccentric to the right at L3. No substantial marrow edema. Postoperative changes of interbody fusion again identified at L4-L5 and L5-S1. Hardware is not well evaluated on this study and there is associated susceptibility artifact. Conus medullaris and cauda equina: Conus extends to the L1-L2 level. Conus and cauda equina appear normal. Paraspinal and other soft tissues: Chronic postoperative changes. Disc levels: Congenital narrowing of the spinal canal. T12-L1: Minimal retropulsion of superior endplate of L1. Mild facet arthropathy. No significant canal or foraminal stenosis. L1-L2: Disc bulge. Mild facet arthropathy with ligamentum flavum infolding. No significant canal or foraminal stenosis. L2-L3: Disc bulge. Mild facet arthropathy  ligamentum flavum infolding. Mild canal stenosis. Mild foraminal stenosis. L3-L4: Disc bulge. Mild facet arthropathy with ligamentum flavum infolding. No significant canal or foraminal stenosis. L4-L5: Postoperative level. Posterior decompression. No canal stenosis. Foramina are distorted by artifact. On the right, this is related to posterior positioning of the interbody cage also present on the prior study. Probable mild foraminal stenosis. L5-S1: Operative level. Posterior decompression. Endplate osteophytes. No significant canal or foraminal stenosis. IMPRESSION: Mild L1 and L2 compression fractures are new since February 13, 2020. However, there is no significant marrow edema to suggest acuity. Degenerative and postoperative changes as detailed above. No high-grade stenosis identified. Electronically Signed   By: Macy Mis M.D.   On: 04/13/2020 18:18     CBC Recent Labs  Lab 04/12/20 1411 04/13/20 0439 04/14/20 0414  WBC 3.5* 6.7 7.5  HGB 12.1 11.7* 11.9*  HCT 38.1 38.6 37.7  PLT 52* 56* 71*  MCV 96.9 99.7 96.7  MCH 30.8 30.2 30.5  MCHC 31.8 30.3 31.6  RDW 16.4* 16.4* 16.2*  LYMPHSABS 0.5*  --    --   MONOABS 0.2  --   --   EOSABS 0.1  --   --   BASOSABS 0.0  --   --     Chemistries  Recent Labs  Lab 04/08/20 1519 04/12/20 1411 04/13/20 0439 04/14/20 0414  NA 138 133* 132* 131*  K 4.6 4.2 4.2 4.1  CL 99 99 100 98  CO2 23 25 20* 24  GLUCOSE 93 98 118* 97  BUN 12 12 13 16   CREATININE 1.00 1.06* 0.82 1.18*  CALCIUM 8.8 8.9 8.8* 9.2  AST  --  60* 64*  --   ALT  --  25 26  --   ALKPHOS  --  88 86  --   BILITOT  --  5.9* 5.6*  --    ------------------------------------------------------------------------------------------------------------------ No results for input(s): CHOL, HDL, LDLCALC, TRIG, CHOLHDL, LDLDIRECT in the last 72 hours.  No results found for: HGBA1C ------------------------------------------------------------------------------------------------------------------ No results for input(s): TSH, T4TOTAL, T3FREE, THYROIDAB in the last 72 hours.  Invalid input(s): FREET3 ------------------------------------------------------------------------------------------------------------------ No results for input(s): VITAMINB12, FOLATE, FERRITIN, TIBC, IRON, RETICCTPCT in the last 72 hours.  Coagulation profile No results for input(s): INR, PROTIME in the last 168 hours.  Recent Labs    04/12/20 2119  DDIMER 9.23*    Cardiac Enzymes No results for input(s): CKMB, TROPONINI, MYOGLOBIN in the last 168 hours.  Invalid input(s): CK ------------------------------------------------------------------------------------------------------------------    Component Value Date/Time   BNP 281.0 (H) 02/19/2020 9371   Roxan Hockey M.D on 04/14/2020 at 4:22 PM  Go to [email or web address] - for contact info  [facility] - Office  [phone]

## 2020-04-14 NOTE — TOC Progression Note (Signed)
Transition of Care [facility]) - Progression Note    Patient Details  Name: Samantha [name] MRN: [id number] Date of Birth: December 02, 1950  [facility] CM/SW Contact  Ihor Gully, LCSW Phone Number: 04/14/2020, 11:08 AM  Clinical Narrative:    Patient is agreeable to SNF. Discussed SNF options. Referred to requested facilities. At baseline, patient ambulates with a walker, is independent with ADLs, is on 2L home oxygen. Patient thinks she has a [employer] [facility] in her attic.    Expected Discharge Plan: Home w Hospice Care Barriers to Discharge: Continued Medical Work up  Expected Discharge Plan and Services Expected Discharge Plan: [place] In-house Referral: NA Discharge Planning Services: NA Post Acute Care Choice: NA Living arrangements for the past 2 months: Single Family Home                 DME Arranged: N/A DME Agency: NA       HH Arranged: NA HH Agency: NA         Social Determinants of Health (SDOH) Interventions    Readmission Risk Interventions Readmission Risk Prevention Plan 02/19/2020  PCP or Specialist Appt within 3-5 Days Complete  Some recent data might be hidden

## 2020-04-15 DIAGNOSIS — K7460 Unspecified cirrhosis of liver: Secondary | ICD-10-CM | POA: Diagnosis not present

## 2020-04-15 DIAGNOSIS — R262 Difficulty in walking, not elsewhere classified: Secondary | ICD-10-CM | POA: Diagnosis not present

## 2020-04-15 DIAGNOSIS — Z951 Presence of aortocoronary bypass graft: Secondary | ICD-10-CM | POA: Diagnosis not present

## 2020-04-15 DIAGNOSIS — S32020A Wedge compression fracture of second lumbar vertebra, initial encounter for closed fracture: Secondary | ICD-10-CM | POA: Diagnosis not present

## 2020-04-15 DIAGNOSIS — K7469 Other cirrhosis of liver: Secondary | ICD-10-CM | POA: Diagnosis not present

## 2020-04-15 DIAGNOSIS — I4819 Other persistent atrial fibrillation: Secondary | ICD-10-CM | POA: Diagnosis not present

## 2020-04-15 DIAGNOSIS — R296 Repeated falls: Secondary | ICD-10-CM | POA: Diagnosis not present

## 2020-04-15 DIAGNOSIS — K219 Gastro-esophageal reflux disease without esophagitis: Secondary | ICD-10-CM | POA: Diagnosis not present

## 2020-04-15 DIAGNOSIS — S32000A Wedge compression fracture of unspecified lumbar vertebra, initial encounter for closed fracture: Secondary | ICD-10-CM | POA: Diagnosis not present

## 2020-04-15 DIAGNOSIS — I4892 Unspecified atrial flutter: Secondary | ICD-10-CM | POA: Diagnosis not present

## 2020-04-15 DIAGNOSIS — R531 Weakness: Secondary | ICD-10-CM | POA: Diagnosis not present

## 2020-04-15 DIAGNOSIS — I2510 Atherosclerotic heart disease of native coronary artery without angina pectoris: Secondary | ICD-10-CM | POA: Diagnosis not present

## 2020-04-15 DIAGNOSIS — S32020G Wedge compression fracture of second lumbar vertebra, subsequent encounter for fracture with delayed healing: Secondary | ICD-10-CM | POA: Diagnosis not present

## 2020-04-15 DIAGNOSIS — I5042 Chronic combined systolic (congestive) and diastolic (congestive) heart failure: Secondary | ICD-10-CM | POA: Diagnosis not present

## 2020-04-15 DIAGNOSIS — I5020 Unspecified systolic (congestive) heart failure: Secondary | ICD-10-CM | POA: Diagnosis not present

## 2020-04-15 DIAGNOSIS — Z743 Need for continuous supervision: Secondary | ICD-10-CM | POA: Diagnosis not present

## 2020-04-15 DIAGNOSIS — I480 Paroxysmal atrial fibrillation: Secondary | ICD-10-CM | POA: Diagnosis not present

## 2020-04-15 DIAGNOSIS — D696 Thrombocytopenia, unspecified: Secondary | ICD-10-CM | POA: Diagnosis not present

## 2020-04-15 DIAGNOSIS — E871 Hypo-osmolality and hyponatremia: Secondary | ICD-10-CM | POA: Diagnosis not present

## 2020-04-15 DIAGNOSIS — J9611 Chronic respiratory failure with hypoxia: Secondary | ICD-10-CM | POA: Diagnosis not present

## 2020-04-15 DIAGNOSIS — R799 Abnormal finding of blood chemistry, unspecified: Secondary | ICD-10-CM | POA: Diagnosis not present

## 2020-04-15 DIAGNOSIS — E7220 Disorder of urea cycle metabolism, unspecified: Secondary | ICD-10-CM | POA: Diagnosis not present

## 2020-04-15 DIAGNOSIS — I10 Essential (primary) hypertension: Secondary | ICD-10-CM | POA: Diagnosis not present

## 2020-04-15 DIAGNOSIS — M4722 Other spondylosis with radiculopathy, cervical region: Secondary | ICD-10-CM | POA: Diagnosis not present

## 2020-04-15 DIAGNOSIS — M6281 Muscle weakness (generalized): Secondary | ICD-10-CM | POA: Diagnosis not present

## 2020-04-15 DIAGNOSIS — R279 Unspecified lack of coordination: Secondary | ICD-10-CM | POA: Diagnosis not present

## 2020-04-15 DIAGNOSIS — R6889 Other general symptoms and signs: Secondary | ICD-10-CM | POA: Diagnosis not present

## 2020-04-15 DIAGNOSIS — S32020D Wedge compression fracture of second lumbar vertebra, subsequent encounter for fracture with routine healing: Secondary | ICD-10-CM | POA: Diagnosis not present

## 2020-04-15 DIAGNOSIS — I255 Ischemic cardiomyopathy: Secondary | ICD-10-CM | POA: Diagnosis not present

## 2020-04-15 DIAGNOSIS — E782 Mixed hyperlipidemia: Secondary | ICD-10-CM | POA: Diagnosis not present

## 2020-04-15 LAB — RESP PANEL BY RT-PCR (FLU A&B, COVID) ARPGX2
Influenza A by PCR: NEGATIVE
Influenza B by PCR: NEGATIVE
SARS Coronavirus 2 by RT PCR: NEGATIVE

## 2020-04-15 MED ORDER — BISACODYL 10 MG RE SUPP
10.0000 mg | Freq: Once | RECTAL | Status: AC
  Administered 2020-04-15: 10 mg via RECTAL
  Filled 2020-04-15: qty 1

## 2020-04-15 MED ORDER — METHOCARBAMOL 500 MG PO TABS: 500.0000 mg | ORAL_TABLET | Freq: Three times a day (TID) | ORAL | 1 refills | Status: AC

## 2020-04-15 MED ORDER — OXYCODONE HCL 10 MG PO TABS: 10.0000 mg | ORAL_TABLET | ORAL | 0 refills | Status: AC | PRN

## 2020-04-15 MED ORDER — SENNOSIDES-DOCUSATE SODIUM 8.6-50 MG PO TABS: 2.0000 | ORAL_TABLET | Freq: Every day | ORAL | 1 refills | Status: DC

## 2020-04-15 MED ORDER — LIDOCAINE 5 % EX PTCH: 1.0000 | MEDICATED_PATCH | CUTANEOUS | 0 refills | Status: AC

## 2020-04-15 MED ORDER — POLYETHYLENE GLYCOL 3350 17 G PO PACK
17.0000 g | PACK | Freq: Every day | ORAL | Status: DC
  Administered 2020-04-15: 17 g via ORAL
  Filled 2020-04-15: qty 1

## 2020-04-15 NOTE — Discharge Instructions (Signed)
1)Follow-up with gastroenterologist for ongoing management of your liver cirrhosis 2) Use continuous oxygen at 2 L/min via nasal cannula

## 2020-04-15 NOTE — TOC Transition Note (Signed)
Transition of Care [facility]) - CM/SW Discharge Note   Patient Details  Name: Samantha [name] MRN: [id number] Date of Birth: 1951-02-16  [facility] of [facility]) CM/SW Contact:  Ihor Gully, LCSW Phone Number: 04/15/2020, 1:46 PM   Clinical Narrative:    Discharge clinicals sent to the facility.     Final next level of care: Skilled Nursing Facility Barriers to Discharge: No Barriers Identified   Patient Goals and CMS Choice Patient states their goals for this hospitalization and ongoing recovery are:: Return home with Hospice CMS Medicare.gov Compare Post Acute Care list provided to:: Patient Choice offered to / list presented to : Patient  Discharge Placement              Patient chooses bed at: [facility] Patient to be transferred to facility by: [place] Name of family member notified: Quita Skye, sister in law Patient and family notified of of transfer: 04/15/20  Discharge Plan and Services In-house Referral: NA Discharge Planning Services: NA Post Acute Care Choice: NA          DME Arranged: N/A DME Agency: NA       HH Arranged: NA HH Agency: NA        Social Determinants of Health ([place]) Interventions     Readmission Risk Interventions Readmission Risk Prevention Plan 02/19/2020  PCP or Specialist Appt within 3-5 Days Complete  Some recent data might be hidden

## 2020-04-15 NOTE — Care Management Important Message (Signed)
Important Message  Patient Details  Name: Samantha [name] MRN: [id number] Date of Birth: Oct 30, 1950   Medicare Important Message Given:  Yes     Ihor Gully, LCSW 04/15/2020, 1:12 PM

## 2020-04-15 NOTE — Progress Notes (Signed)
Patient discharged from hospital to [facility]. Transported via [facility] by EMS. No distress noted at time of departure.

## 2020-04-15 NOTE — Discharge Summary (Signed)
Samantha Samantha, is a 70 y.o. female  DOB 02-16-1951  MRN [id number].  Admission date:  04/12/2020  Admitting Physician  Roxan Hockey, MD  Discharge Date:  04/15/2020   Primary MD  Quintin Alto, Silvestre Moment, MD  Recommendations for primary care physician for things to follow:    1)Follow-up with Gastroenterologist for ongoing management of your liver cirrhosis 2) Use continuous oxygen at 2 L/min via nasal cannula  Admission Diagnosis  Compression fracture of L2 lumbar vertebra (HCC) [S32.020A] Closed compression fracture of L2 lumbar vertebra with delayed healing, subsequent encounter [S32.020G] Lumbar compression fracture, closed, initial encounter ([place]) [S32.000A]   Discharge Diagnosis  Compression fracture of L2 lumbar vertebra ([place]) [S32.020A] Closed compression fracture of L2 lumbar vertebra with delayed healing, subsequent encounter [S32.020G] Lumbar compression fracture, closed, initial encounter ([place]) [S32.000A]    Principal Problem:   Compression fracture of L2 lumbar vertebra ([place]) Active Problems:   Recurrent falls   L1 and L 2--Lumbar compression fracture, closed, initial encounter ([place])   Cirrhosis, non-alcoholic ([place])   Cardiomyopathy, ischemic-35-40% by cath    Paroxysmal atrial fibrillation ([place])   Chronic respiratory failure with hypoxia -CHF    S/P CABG x 3- 2001      Past Medical History:  Diagnosis Date  . Cataract    OU  . Chronic combined systolic and diastolic CHF (congestive heart failure) ([place])   . Cirrhosis of liver ([place])   . CKD (chronic kidney disease), stage III ([place])   . Coronary artery disease    a. s/p CABG 2001 w/ (LIMA-OM, SVG-D1, SVG-RCA). b. h/o multiple PCIs per Dr. Antionette Char note.  . Depression   . Esophageal varices ([place])    New 2013  . Gastroesophageal reflux disease   . History of pneumonia   . Hyperlipidemia   . Hypertension   . Hypertensive retinopathy     OU  . Obesity   . Osteoarthritis   . Pancytopenia ([place])   . Paroxysmal atrial flutter ([place])    a. dx 05/2016.  [name] Persistent atrial fibrillation ([place])    a. reported in hosp 07/2016, not on anticoag due to cirrhosis and liver disease, low platelets, varices.  . Thrombocytopenia ([place])     Past Surgical History:  Procedure Laterality Date  . ABDOMINAL HYSTERECTOMY    . BACK SURGERY    . CARDIAC CATHETERIZATION  2004   left internal mammary artery to the obtuse marginal  was found to be small and thread like.  The two grafts were patent.  The left circumflex had 90% in-stent restenosis and cutting balloon angioplasty was performed followed by placement of a 3.0 x 88m Taxus drug -eluting stent.    .Marland KitchenCARDIAC CATHETERIZATION  2006   There was in-stent restenosis in the left circumflex and this was treated with cutting balloon angioplasty   . CARDIAC CATHETERIZATION  2008   vein graft to the to the obtuse marginal was patent, although small, left circumflex had 40% in-stent restenosis, ejection fraction 40-45%.  The patient was medically mananged.  .[name]  CARDIAC CATHETERIZATION N/A 01/09/2015   Procedure: Left Heart Cath and Cors/Grafts Angiography;  Surgeon: Belva Crome, MD;  Location: [facility];  Service: Cardiovascular;  Laterality: N/A;  . CATARACT EXTRACTION W/PHACO Right 05/17/2019   Procedure: CATARACT EXTRACTION PHACO AND INTRAOCULAR LENS PLACEMENT (IOC) (CDE: 14.99);  Surgeon: Baruch Goldmann, MD;  Location: [facility];  Service: Ophthalmology;  Laterality: Right;  . CATARACT EXTRACTION W/PHACO Left 06/10/2019   Procedure: CATARACT EXTRACTION PHACO AND INTRAOCULAR LENS PLACEMENT (IOC);  Surgeon: Baruch Goldmann, MD;  Location: [facility];  Service: Ophthalmology;  Laterality: Left;  CDE: 11.96  . cataract sx    . COLONOSCOPY  03/29/2011   Procedure: COLONOSCOPY;  Surgeon: Jamesetta So, MD;  Location[facility];  Service: Gastroenterology;  Laterality: N/A;  . CORONARY ARTERY BYPASS  GRAFT  May 31,2001   x 3 with a vein graft to the first diagonal, vein graft to the right coronary  artery, and a free left internal mammary  artery to the obtuse marginal   . ESOPHAGEAL BANDING N/A 07/04/2012   Procedure: ESOPHAGEAL BANDING;  Surgeon: Rogene Houston, MD;  [facility];  Service: Endoscopy;  Laterality: N/A;  . ESOPHAGEAL BANDING N/A 09/17/2012   Procedure: ESOPHAGEAL BANDING;  Surgeon: Rogene Houston, MD;  [facility];  Service: Endoscopy;  Laterality: N/A;  . ESOPHAGEAL BANDING N/A 10/22/2013   Procedure: ESOPHAGEAL BANDING;  Surgeon: Rogene Houston, MD;  [facility];  Service: Endoscopy;  Laterality: N/A;  . ESOPHAGEAL BANDING N/A 11/28/2014   Procedure: ESOPHAGEAL BANDING;  Surgeon: Rogene Houston, MD;  [facility];  Service: Endoscopy;  Laterality: N/A;  . ESOPHAGEAL BANDING N/A 10/29/2015   Procedure: ESOPHAGEAL BANDING;  Surgeon: Rogene Houston, MD;  [facility];  Service: Endoscopy;  Laterality: N/A;  . ESOPHAGOGASTRODUODENOSCOPY  12/20/2011   Procedure: ESOPHAGOGASTRODUODENOSCOPY (EGD);  Surgeon: Jamesetta So, MD;  [facility];  Service: Gastroenterology;  Laterality: N/A;  . ESOPHAGOGASTRODUODENOSCOPY N/A 07/04/2012   Procedure: ESOPHAGOGASTRODUODENOSCOPY (EGD);  Surgeon: Rogene Houston, MD;  [facility];  Service: Endoscopy;  Laterality: N/A;  235-moved to 255 Ann to notify pt  . ESOPHAGOGASTRODUODENOSCOPY N/A 09/17/2012   Procedure: ESOPHAGOGASTRODUODENOSCOPY (EGD);  Surgeon: Rogene Houston, MD;  [facility];  Service: Endoscopy;  Laterality: N/A;  730  . ESOPHAGOGASTRODUODENOSCOPY N/A 10/22/2013   Procedure: ESOPHAGOGASTRODUODENOSCOPY (EGD);  Surgeon: Rogene Houston, MD;  [facility];  Service: Endoscopy;  Laterality: N/A;  730  . ESOPHAGOGASTRODUODENOSCOPY N/A 11/28/2014   Procedure: ESOPHAGOGASTRODUODENOSCOPY (EGD);  Surgeon: Rogene Houston, MD;  [facility];  Service: Endoscopy;  Laterality: N/A;  1:25  . ESOPHAGOGASTRODUODENOSCOPY N/A 10/29/2015   Procedure: ESOPHAGOGASTRODUODENOSCOPY (EGD);  Surgeon: Rogene Houston, MD;  [facility];  Service: Endoscopy;  Laterality: N/A;  12:00  . ESOPHAGOGASTRODUODENOSCOPY N/A 12/12/2016   Grade 1 and 2 varices in lower third of esophagus, post variceal banding scar distal esophagus, mild portal hypertensive gastropathy, Type 1 gastroesophageal varices without bleeding, normal duodenum and second portion of duodenum.   [name] FLEXIBLE SIGMOIDOSCOPY N/A 03/20/2017   external hemorrhoids  . JOINT REPLACEMENT    . LEFT HEART CATH AND CORS/GRAFTS ANGIOGRAPHY N/A 08/15/2016   Procedure: Left Heart Cath and Cors/Grafts Angiography;  Surgeon: Jettie Booze, MD;  Location: [facility];  Service: Cardiovascular;  Laterality: N/A;  . LEFT HEART CATHETERIZATION WITH CORONARY/GRAFT ANGIOGRAM N/A 12/25/2013  Procedure: LEFT HEART CATHETERIZATION WITH Beatrix Fetters;  Surgeon: Blane Ohara, MD;  Location[facility];  Service: Cardiovascular;  Laterality: N/A;  . RIGHT HEART CATH N/A 05/29/2017   Procedure: RIGHT HEART CATH;  Surgeon: Jolaine Artist, MD;  Location: [facility];  Service: Cardiovascular;  Laterality: N/A;  . rotator cuff left  2007  . TONSILLECTOMY       HPI  from the history and physical done on the day of admission:   Chief Complaint: back pain   HPI: Samantha Samantha is a 70 y.o. female with medical history significant of liver cirrhosis with history of GI bleed, CAD with history of CABG in 2001, CKD stage IIIa, hypertension, hyperlipidemia, paroxysmal a flutter of, chronic LBBB, chronic systolic CHF with EF 55-73% who comes into the hospital with severe back pain after having a fall at home.  Patient tripped and fell, states that it was a mechanical fall, denies any loss of consciousness, denies any lightheadedness or dizziness.  Denies any chest  pain or palpitations at the time or prior to the fall.  She complains of chronic shortness of breath, not worse than her baseline.  She is on oxygen at home.  She reports that her weight has been going up and down, and has been having increased weight gain for which her diuretics have been changed and currently reports being on torsemide 40 mg twice daily.  States that her legs are always swollen but denies any significant fluid overload or weight gain recently.  While in the ED she had a transient episode of chest pressure, tells me she had those in the past and this episode was different than her cardiac chest pain.  ED Course: Well, heart rate is around 100, she is normotensive and satting 98-99% on her chronic 2 L.  Blood work reveals mild hyponatremia with sodium of 133, creatinine 1.06, WBC is 3.5 and platelets are 52.  SARS-CoV-2 pending. A lumbar x-ray showed a mild superior endplate compression fracture at L2, stable old L1 compression fracture with vertebral augmentation.  Pelvic x-ray showed right total hip prosthesis with stable appearance without new fractures identified.  Patient is unable to ambulate in the ED, pain was not being able to be controlled with oral agents and we are asked to admit.  Review of Systems: All systems reviewed, and apart from HPI, all negative     Hospital Course:    Brief Summary:- 70 y.o.femalewith medical history significant ofliver cirrhosis with history of GI bleed, CAD with history of CABG in 2001, CKD stage IIIa, hypertension, hyperlipidemia, paroxysmal a flutter of, chronic LBBB, chronic systolic CHF with EF 22-02%  A/p 1)Status post fall with new L2 compression fracture with ambulatory dysfunction--- PT recommends SNF -MRI of Lumbar Spine--- MRI findings indicates that pt is Not a candidate for kyphoplasty/vertebroplasty (Mild L1 and L2 compression fractures are new since December 16--- does not appear to be very acute) -Continue opiates and  muscle relaxants -Transfer to SNF rehab for ongoing physical therapy  2)HFrEF-- history of ischemic cardiomyopathy --- last known EF 35 to 54%, chronic systolic dysfunction CHF -No evidence of flareup at this time appears euvolemic and compensated at this time -Continue Aldactone 25 mg daily, and metoprolol -Patient has intolerance to Entresto -No aspirin due to chronic anemia and thrombocytopenia with GI bleed  3) liver cirrhosis with pancytopenia--- no bleeding noted at this time, patient does have history of prior GI bleed,  -Outpatient follow-up with GI  service advised  4)PAF--- continue metoprolol for rate control, not a candidate for anticoagulation due to history of GI bleed and chronic thrombocytopenia and anemia  5) chronic hypoxic respiratory failure--- continue home O2 at 2L/min, no acute decompensation at this time  6) depression and anxiety--Prozac 20 mg daily,  7)Recurrentfalls/Disposition---PTA pt lived alone and did very poorly, and then patient moved in with her brother and sister-in-law--still had a fall patient has significant limitations with mobility related ADLs-this patient needs to continue to be monitored in the hospital until a SNF bed is obtained as she is not safe to go home with her current physcical limitations -Transfer to SNF rehab  Disposition/--SNF  Disposition: The patient is from: Home  Anticipated d/c is to: SNF  Code Status :  -  Code Status: DNR   Family Communication:   (patient is alert, awake and coherent)   Consults  :    DVT Prophylaxis  :   - SCDs SCDs Start: 04/12/20 1931  Discharge Condition: Stable  Follow UP   Contact information for after-discharge care    Destination    [facility] Preferred SNF .   Service: Skilled Nursing Contact information: [address] [place] [phone]                  Consults obtained -   Diet and Activity  recommendation:  As advised  Discharge Instructions    Discharge Instructions    Call MD for:  difficulty breathing, headache or visual disturbances   Complete by: As directed    Call MD for:  persistant dizziness or light-headedness   Complete by: As directed    Call MD for:  persistant nausea and vomiting   Complete by: As directed    Call MD for:  temperature >100.4   Complete by: As directed    Diet - low sodium heart healthy   Complete by: As directed    Discharge instructions   Complete by: As directed    1)Follow-up with gastroenterologist for ongoing management of your liver cirrhosis 2) Use continuous oxygen at 2 L/min via nasal cannula   Increase activity slowly   Complete by: As directed        Discharge Medications     Allergies as of 04/15/2020      Reactions   Entresto [sacubitril-valsartan] Swelling   Swelling of eyes   Vibra-tab [doxycycline] Nausea Only   Acetaminophen Other (See Comments)   Liver problems   Ace Inhibitors Other (See Comments)   UNSURE OF REACTION TYPE   Cefaclor Itching, Rash   Pt tolerates meropenem   Cephalexin Itching, Rash   Pt tolerates meropenem   Penicillins Rash   Has patient had a PCN reaction causing immediate rash, facial/tongue/throat swelling, SOB or lightheadedness with hypotension: YES Has patient had a PCN reaction causing severe rash involving mucus membranes or skin necrosis: NO Has patient had a PCN reaction that required hospitalization: YES Has patient had a PCN reaction occurring within the last 10 years: NO If all of the above answers are "NO", then may proceed with Cephalosporin use. Pt tolerates meropenem 02/19/20   Pregabalin Other (See Comments)   Retains fluid   Tape Itching, Rash   Takes skin right off with medical tape--PAPER TAPE ONLY      Medication List    STOP taking these medications   cyclobenzaprine 10 MG tablet Commonly known as: FLEXERIL   Metamucil Smooth Texture 58.6 % powder Generic  drug: psyllium   potassium chloride SA 20 MEQ tablet Commonly known as: KLOR-CON     TAKE these medications   albuterol 108 (90 Base) MCG/ACT inhaler Commonly known as: VENTOLIN HFA Inhale 2 puffs into the lungs every 6 (six) hours as needed for wheezing or shortness of breath.   ezetimibe 10 MG tablet Commonly known as: ZETIA Take 10 mg by mouth at bedtime.   FLUoxetine 20 MG capsule Commonly known as: PROZAC Take 20 mg by mouth daily.   ipratropium 0.03 % nasal spray Commonly known as: ATROVENT Place 1 spray into both nostrils daily as needed (allergies).   lidocaine 5 % Commonly known as: LIDODERM Place 1 patch onto the skin daily. Remove & Discard patch within 12 hours or as directed by MD   methocarbamol 500 MG tablet Commonly known as: Robaxin Take 1 tablet (500 mg total) by mouth 3 (three) times daily.   metoprolol tartrate 25 MG tablet Commonly known as: LOPRESSOR Take 0.5 tablets (12.5 mg total) by mouth daily as needed (palpitations). Only take if your heart rate is over 120 beats per minute.   Nitrostat 0.4 MG SL tablet Generic drug: nitroGLYCERIN Place 0.4 mg under the tongue every 5 (five) minutes as needed for chest pain (x 3 tabs daily).   ondansetron 4 MG disintegrating tablet Commonly known as: Zofran ODT Take 1 tablet (4 mg total) by mouth every 8 (eight) hours as needed for nausea or vomiting.   Oxycodone HCl 10 MG Tabs Take 1 tablet (10 mg total) by mouth every 4 (four) hours as needed. What changed:   when to take this  reasons to take this   pantoprazole 40 MG tablet Commonly known as: PROTONIX Take 40 mg by mouth daily.   polyethylene glycol 17 g packet Commonly known as: MIRALAX / GLYCOLAX Take 17 g by mouth daily.   senna-docusate 8.6-50 MG tablet Commonly known as: Senokot-S Take 2 tablets by mouth at bedtime.   spironolactone 25 MG tablet Commonly known as: ALDACTONE Take 25 mg by mouth daily.      Major procedures and  Radiology Reports - PLEASE review detailed and final reports for all details, in brief -   DG Lumbar Spine Complete  Result Date: 04/12/2020 CLINICAL DATA:  Fall today, low back and hip pain EXAM: LUMBAR SPINE - COMPLETE 4+ VIEW COMPARISON:  02/23/2020 CT scan and 04/25/2006 radiographs FINDINGS: Approximately 50% L1 compression fracture with vertebral augmentation similar to the 02/13/2020 exam. New mild superior endplate compression fracture at L2, with concavity of the superior endplate best depicted on the lateral projection and not previously present on 02/13/2020. Interbody cage devices at L4-5 and L5-S1 not appreciably changed. The facet joints are absent at L4-5 and L5-S1 Bony demineralization. IMPRESSION: 1. New mild superior endplate compression fracture at L2 with concavity of the superior endplate. 2. Stable old L1 compression fracture with vertebral augmentation. 3. Interbody cage devices at L4-5 and L5-S1 not appreciably changed. 4. Bony demineralization, overall appearance favors osteoporosis. Electronically Signed   By: Van Clines M.D.   On: 04/12/2020 12:12   DG Pelvis 1-2 Views  Result Date: 04/12/2020 CLINICAL DATA:  Fall this morning, hip pain EXAM: PELVIS - 1-2 VIEW COMPARISON:  CT pelvis 01/24/2020 FINDINGS: Moderate to severe degenerative left hip arthropathy with degenerative subcortical cyst formation is specially in the femoral head, as well as loss of articular space and mild subcortical sclerosis. These findings were also present on the 01/24/2020 exam and accordingly are not  felt to be acute. There is some mild spurring of the left femoral head laterally, likewise stable. I not see an obvious new cortical discontinuity involving the left proximal femur to indicate a new femoral fracture. Right total hip prosthesis with the femoral head component reasonably aligned with the acetabular shell component on this single view. There is slightly less space between the femoral  head component in the shell component superiorly but this is probably from polymer wear, and the same appearance was present on 01/24/2020. Bony lucency below the right acetabulum compatible with particulate disease, unchanged from 01/24/2020. The arcuate lines of the sacrum appear continuous, as expected. No discrete fracture identified. IMPRESSION: 1. 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 2. No new fracture identified. 3. Right total hip prosthesis. Stable lucency below the acetabular shell component is likely a manifestation of particulate disease. Electronically Signed   By: Van Clines M.D.   On: 04/12/2020 12:15   MR LUMBAR SPINE WO CONTRAST  Result Date: 04/13/2020 CLINICAL DATA:  Low back pain down both legs EXAM: MRI LUMBAR SPINE WITHOUT CONTRAST TECHNIQUE: Multiplanar, multisequence MR imaging of the lumbar spine was performed. No intravenous contrast was administered. COMPARISON:  No recent MR imaging. Prior MRI is from 2011. Correlation made with CT from December 2021 FINDINGS: Motion artifact is present. Segmentation: Standard. Alignment:  Trace retrolisthesis at L1-L2. Vertebrae: Chronic L1 compression fracture with evidence of cement augmentation. New mild loss of height at the superior and inferior endplates of L2 and eccentric to the right at L3. No substantial marrow edema. Postoperative changes of interbody fusion again identified at L4-L5 and L5-S1. Hardware is not well evaluated on this study and there is associated susceptibility artifact. Conus medullaris and cauda equina: Conus extends to the L1-L2 level. Conus and cauda equina appear normal. Paraspinal and other soft tissues: Chronic postoperative changes. Disc levels: Congenital narrowing of the spinal canal. T12-L1: Minimal retropulsion of superior endplate of L1. Mild facet arthropathy. No significant canal or foraminal stenosis. L1-L2: Disc bulge. Mild facet arthropathy with  ligamentum flavum infolding. No significant canal or foraminal stenosis. L2-L3: Disc bulge. Mild facet arthropathy ligamentum flavum infolding. Mild canal stenosis. Mild foraminal stenosis. L3-L4: Disc bulge. Mild facet arthropathy with ligamentum flavum infolding. No significant canal or foraminal stenosis. L4-L5: Postoperative level. Posterior decompression. No canal stenosis. Foramina are distorted by artifact. On the right, this is related to posterior positioning of the interbody cage also present on the prior study. Probable mild foraminal stenosis. L5-S1: Operative level. Posterior decompression. Endplate osteophytes. No significant canal or foraminal stenosis. IMPRESSION: Mild L1 and L2 compression fractures are new since February 13, 2020. However, there is no significant marrow edema to suggest acuity. Degenerative and postoperative changes as detailed above. No high-grade stenosis identified. Electronically Signed   By: Macy Mis M.D.   On: 04/13/2020 18:18   Micro Results   Recent Results (from the past 240 hour(s))  SARS CORONAVIRUS 2 (TAT 6-24 HRS) Nasopharyngeal Nasopharyngeal Swab     Status: None   Collection Time: 04/12/20  2:16 PM   Specimen: Nasopharyngeal Swab  Result Value Ref Range Status   SARS Coronavirus 2 NEGATIVE NEGATIVE Final    Comment: (NOTE) SARS-CoV-2 target nucleic acids are NOT DETECTED.  The SARS-CoV-2 RNA is generally detectable in upper and lower respiratory specimens during the acute phase of infection. Negative results do not preclude SARS-CoV-2 infection, do not rule out co-infections with other pathogens, and should not be used  as the sole basis for treatment or other patient management decisions. Negative results must be combined with clinical observations, patient history, and epidemiological information. The expected result is Negative.  Fact Sheet for Patients: SugarRoll.be  Fact Sheet for Healthcare  Providers: https://www.woods-mathews.com/  This test is not yet approved or cleared by the [place] FDA and  has been authorized for detection and/or diagnosis of SARS-CoV-2 by FDA under an Emergency Use Authorization (EUA). This EUA will remain  in effect (meaning this test can be used) for the duration of the COVID-19 declaration under Se ction 564(b)(1) of the Act, 21 U.S.C. section 360bbb-3(b)(1), unless the authorization is terminated or revoked sooner.  Performed at [facility], [place] [address], [place], [place] [address]     Today   Subjective    Samantha Samantha today c/o of constipation--  No fever  Or chills   No Nausea, Vomiting or Diarrhea  Back pain is not much   Patient has been seen and examined prior to discharge   Objective   Blood pressure (!) 109/53, pulse 93, temperature 98.2 F (36.8 C), temperature source Oral, resp. rate 20, height 5' 2"  (1.575 m), weight 74.8 kg, last menstrual period 03/29/2011, SpO2 98 %.  Intake/Output Summary (Last 24 hours) at 04/15/2020 1055 Last data filed at 04/15/2020 0900 Gross per 24 hour  Intake 694.6 ml  Output 1200 ml  Net -505.4 ml   Exam Gen:- Awake Alert, no acute distress HEENT:- [place].AT, No sclera icterus Nose- [place] 2L/min Neck-Supple Neck,No JVD,.  Lungs-no wheezing, fair symmetrical air movement CV- S1, S2 normal, irregularly irregular Abd-  +ve B.Sounds, Abd Soft, No tenderness,    Extremity/Skin:- No  edema, pedal pulses present  Psych-affect is appropriate, oriented x3 Neuro-Generalized weakness, no new focal deficits, no tremors MSK-mobility related challenges especially along the lumbar spine area   Data Review   CBC w Diff:  Lab Results  Component Value Date   WBC 7.5 04/14/2020   HGB 11.9 (L) 04/14/2020   HCT 37.7 04/14/2020   PLT 71 (L) 04/14/2020   LYMPHOPCT 14 04/12/2020   MONOPCT 6 04/12/2020   EOSPCT 2 04/12/2020   BASOPCT 1 04/12/2020    CMP:  Lab Results   Component Value Date   NA 131 (L) 04/14/2020   NA 138 04/08/2020   K 4.1 04/14/2020   CL 98 04/14/2020   CO2 24 04/14/2020   BUN 16 04/14/2020   BUN 12 04/08/2020   CREATININE 1.18 (H) 04/14/2020   CREATININE 0.81 01/29/2015   PROT 6.6 04/13/2020   PROT 7.0 02/14/2017   ALBUMIN 2.7 (L) 04/13/2020   ALBUMIN 4.5 02/14/2017   BILITOT 5.6 (H) 04/13/2020   BILITOT 0.5 02/14/2017   ALKPHOS 86 04/13/2020   AST 64 (H) 04/13/2020   ALT 26 04/13/2020  .  Total Discharge time is about 33 minutes  Roxan Hockey M.D on 04/15/2020 at 10:55 AM  Go to [email or web address] -  for contact info  [facility] - Office  [phone]

## 2020-04-16 DIAGNOSIS — I2510 Atherosclerotic heart disease of native coronary artery without angina pectoris: Secondary | ICD-10-CM | POA: Diagnosis not present

## 2020-04-16 DIAGNOSIS — I4892 Unspecified atrial flutter: Secondary | ICD-10-CM | POA: Diagnosis not present

## 2020-04-16 DIAGNOSIS — S32020D Wedge compression fracture of second lumbar vertebra, subsequent encounter for fracture with routine healing: Secondary | ICD-10-CM | POA: Diagnosis not present

## 2020-04-16 DIAGNOSIS — J9611 Chronic respiratory failure with hypoxia: Secondary | ICD-10-CM | POA: Diagnosis not present

## 2020-04-16 DIAGNOSIS — I5020 Unspecified systolic (congestive) heart failure: Secondary | ICD-10-CM | POA: Diagnosis not present

## 2020-04-16 DIAGNOSIS — K7460 Unspecified cirrhosis of liver: Secondary | ICD-10-CM | POA: Diagnosis not present

## 2020-04-16 DIAGNOSIS — I255 Ischemic cardiomyopathy: Secondary | ICD-10-CM | POA: Diagnosis not present

## 2020-04-16 DIAGNOSIS — E871 Hypo-osmolality and hyponatremia: Secondary | ICD-10-CM | POA: Diagnosis not present

## 2020-04-20 NOTE — Telephone Encounter (Signed)
Reviewed recent hospital notes.  Please contact the [facility] and notify them that, prior to admission, her diuretics were increased to Torsemide 40 mg twice daily on 04/08/20.  She was also on K+ 20 mEq twice daily. I'm not sure why, but it was not on her med list when she was admitted and they did not resumes it at [facility].  They stopped her K+ at [facility] as well.  It looks like she has been continued on spironolactone.  The attending provider at [facility] should be aware that she required this dose of diuretics before for her CHF.  It would be good to weigh her daily and monitor for volume excess as she tends to develop decompensated heart failure fairly quickly. Please fax a copy of my office note from 04/08/20.  Richardson Dopp, PA-C    04/20/2020 9:41 AM

## 2020-04-21 DIAGNOSIS — S32020D Wedge compression fracture of second lumbar vertebra, subsequent encounter for fracture with routine healing: Secondary | ICD-10-CM | POA: Diagnosis not present

## 2020-04-23 DIAGNOSIS — I5020 Unspecified systolic (congestive) heart failure: Secondary | ICD-10-CM | POA: Diagnosis not present

## 2020-05-05 DIAGNOSIS — I255 Ischemic cardiomyopathy: Secondary | ICD-10-CM | POA: Diagnosis not present

## 2020-05-05 DIAGNOSIS — I130 Hypertensive heart and chronic kidney disease with heart failure and stage 1 through stage 4 chronic kidney disease, or unspecified chronic kidney disease: Secondary | ICD-10-CM | POA: Diagnosis not present

## 2020-05-05 DIAGNOSIS — I4819 Other persistent atrial fibrillation: Secondary | ICD-10-CM | POA: Diagnosis not present

## 2020-05-05 DIAGNOSIS — R262 Difficulty in walking, not elsewhere classified: Secondary | ICD-10-CM | POA: Diagnosis not present

## 2020-05-05 DIAGNOSIS — N1831 Chronic kidney disease, stage 3a: Secondary | ICD-10-CM | POA: Diagnosis not present

## 2020-05-05 DIAGNOSIS — S32020G Wedge compression fracture of second lumbar vertebra, subsequent encounter for fracture with delayed healing: Secondary | ICD-10-CM | POA: Diagnosis not present

## 2020-05-05 DIAGNOSIS — I5042 Chronic combined systolic (congestive) and diastolic (congestive) heart failure: Secondary | ICD-10-CM | POA: Diagnosis not present

## 2020-05-05 DIAGNOSIS — K7469 Other cirrhosis of liver: Secondary | ICD-10-CM | POA: Diagnosis not present

## 2020-05-05 DIAGNOSIS — J9611 Chronic respiratory failure with hypoxia: Secondary | ICD-10-CM | POA: Diagnosis not present

## 2020-05-09 ENCOUNTER — Inpatient Hospital Stay (HOSPITAL_COMMUNITY): Payer: Medicare Other

## 2020-05-09 ENCOUNTER — Inpatient Hospital Stay (HOSPITAL_COMMUNITY)
Admission: EM | Admit: 2020-05-09 | Discharge: 2020-05-19 | DRG: 242 | Disposition: A | Discharge status: Hospice | Payer: Medicare Other | Attending: Internal Medicine | Admitting: Internal Medicine

## 2020-05-09 ENCOUNTER — Encounter (HOSPITAL_COMMUNITY)
Admission: EM | Disposition: A | Discharge status: Hospice | Payer: Self-pay | Source: Home / Self Care | Attending: Cardiology

## 2020-05-09 ENCOUNTER — Encounter (HOSPITAL_COMMUNITY): Payer: Self-pay | Admitting: *Deleted

## 2020-05-09 ENCOUNTER — Other Ambulatory Visit: Payer: Self-pay

## 2020-05-09 DIAGNOSIS — I5022 Chronic systolic (congestive) heart failure: Secondary | ICD-10-CM

## 2020-05-09 DIAGNOSIS — K766 Portal hypertension: Secondary | ICD-10-CM | POA: Diagnosis present

## 2020-05-09 DIAGNOSIS — R188 Other ascites: Secondary | ICD-10-CM | POA: Diagnosis present

## 2020-05-09 DIAGNOSIS — M4856XD Collapsed vertebra, not elsewhere classified, lumbar region, subsequent encounter for fracture with routine healing: Secondary | ICD-10-CM | POA: Diagnosis present

## 2020-05-09 DIAGNOSIS — J189 Pneumonia, unspecified organism: Secondary | ICD-10-CM | POA: Diagnosis present

## 2020-05-09 DIAGNOSIS — K7460 Unspecified cirrhosis of liver: Secondary | ICD-10-CM | POA: Diagnosis not present

## 2020-05-09 DIAGNOSIS — R0602 Shortness of breath: Secondary | ICD-10-CM

## 2020-05-09 DIAGNOSIS — I255 Ischemic cardiomyopathy: Secondary | ICD-10-CM | POA: Diagnosis not present

## 2020-05-09 DIAGNOSIS — E872 Acidosis: Secondary | ICD-10-CM | POA: Diagnosis present

## 2020-05-09 DIAGNOSIS — Z886 Allergy status to analgesic agent status: Secondary | ICD-10-CM

## 2020-05-09 DIAGNOSIS — I5020 Unspecified systolic (congestive) heart failure: Secondary | ICD-10-CM | POA: Diagnosis not present

## 2020-05-09 DIAGNOSIS — J690 Pneumonitis due to inhalation of food and vomit: Secondary | ICD-10-CM | POA: Diagnosis not present

## 2020-05-09 DIAGNOSIS — K7210 Chronic hepatic failure without coma: Secondary | ICD-10-CM | POA: Diagnosis present

## 2020-05-09 DIAGNOSIS — I25119 Atherosclerotic heart disease of native coronary artery with unspecified angina pectoris: Secondary | ICD-10-CM | POA: Diagnosis not present

## 2020-05-09 DIAGNOSIS — Z951 Presence of aortocoronary bypass graft: Secondary | ICD-10-CM

## 2020-05-09 DIAGNOSIS — K7200 Acute and subacute hepatic failure without coma: Secondary | ICD-10-CM | POA: Diagnosis not present

## 2020-05-09 DIAGNOSIS — I8510 Secondary esophageal varices without bleeding: Secondary | ICD-10-CM | POA: Diagnosis present

## 2020-05-09 DIAGNOSIS — K521 Toxic gastroenteritis and colitis: Secondary | ICD-10-CM | POA: Diagnosis not present

## 2020-05-09 DIAGNOSIS — K761 Chronic passive congestion of liver: Secondary | ICD-10-CM | POA: Diagnosis present

## 2020-05-09 DIAGNOSIS — W19XXXA Unspecified fall, initial encounter: Secondary | ICD-10-CM | POA: Diagnosis not present

## 2020-05-09 DIAGNOSIS — I864 Gastric varices: Secondary | ICD-10-CM | POA: Diagnosis present

## 2020-05-09 DIAGNOSIS — G9341 Metabolic encephalopathy: Secondary | ICD-10-CM

## 2020-05-09 DIAGNOSIS — I4821 Permanent atrial fibrillation: Secondary | ICD-10-CM | POA: Diagnosis not present

## 2020-05-09 DIAGNOSIS — Z8249 Family history of ischemic heart disease and other diseases of the circulatory system: Secondary | ICD-10-CM

## 2020-05-09 DIAGNOSIS — Z950 Presence of cardiac pacemaker: Secondary | ICD-10-CM | POA: Diagnosis not present

## 2020-05-09 DIAGNOSIS — R5381 Other malaise: Secondary | ICD-10-CM | POA: Diagnosis not present

## 2020-05-09 DIAGNOSIS — Z20822 Contact with and (suspected) exposure to covid-19: Secondary | ICD-10-CM | POA: Diagnosis present

## 2020-05-09 DIAGNOSIS — I495 Sick sinus syndrome: Secondary | ICD-10-CM | POA: Diagnosis not present

## 2020-05-09 DIAGNOSIS — K219 Gastro-esophageal reflux disease without esophagitis: Secondary | ICD-10-CM | POA: Diagnosis present

## 2020-05-09 DIAGNOSIS — E785 Hyperlipidemia, unspecified: Secondary | ICD-10-CM | POA: Diagnosis present

## 2020-05-09 DIAGNOSIS — Z751 Person awaiting admission to adequate facility elsewhere: Secondary | ICD-10-CM

## 2020-05-09 DIAGNOSIS — R296 Repeated falls: Secondary | ICD-10-CM | POA: Diagnosis present

## 2020-05-09 DIAGNOSIS — E669 Obesity, unspecified: Secondary | ICD-10-CM | POA: Diagnosis present

## 2020-05-09 DIAGNOSIS — I4892 Unspecified atrial flutter: Secondary | ICD-10-CM | POA: Diagnosis present

## 2020-05-09 DIAGNOSIS — D731 Hypersplenism: Secondary | ICD-10-CM | POA: Diagnosis present

## 2020-05-09 DIAGNOSIS — J440 Chronic obstructive pulmonary disease with acute lower respiratory infection: Secondary | ICD-10-CM | POA: Diagnosis not present

## 2020-05-09 DIAGNOSIS — I4819 Other persistent atrial fibrillation: Secondary | ICD-10-CM | POA: Diagnosis present

## 2020-05-09 DIAGNOSIS — Z515 Encounter for palliative care: Secondary | ICD-10-CM

## 2020-05-09 DIAGNOSIS — Z833 Family history of diabetes mellitus: Secondary | ICD-10-CM

## 2020-05-09 DIAGNOSIS — I5043 Acute on chronic combined systolic (congestive) and diastolic (congestive) heart failure: Secondary | ICD-10-CM | POA: Diagnosis not present

## 2020-05-09 DIAGNOSIS — Z79899 Other long term (current) drug therapy: Secondary | ICD-10-CM

## 2020-05-09 DIAGNOSIS — Z881 Allergy status to other antibiotic agents status: Secondary | ICD-10-CM

## 2020-05-09 DIAGNOSIS — M79603 Pain in arm, unspecified: Secondary | ICD-10-CM | POA: Diagnosis not present

## 2020-05-09 DIAGNOSIS — Z888 Allergy status to other drugs, medicaments and biological substances status: Secondary | ICD-10-CM

## 2020-05-09 DIAGNOSIS — Z955 Presence of coronary angioplasty implant and graft: Secondary | ICD-10-CM

## 2020-05-09 DIAGNOSIS — R079 Chest pain, unspecified: Secondary | ICD-10-CM | POA: Diagnosis not present

## 2020-05-09 DIAGNOSIS — I442 Atrioventricular block, complete: Secondary | ICD-10-CM

## 2020-05-09 DIAGNOSIS — N184 Chronic kidney disease, stage 4 (severe): Secondary | ICD-10-CM | POA: Diagnosis not present

## 2020-05-09 DIAGNOSIS — R269 Unspecified abnormalities of gait and mobility: Secondary | ICD-10-CM | POA: Diagnosis present

## 2020-05-09 DIAGNOSIS — K7469 Other cirrhosis of liver: Secondary | ICD-10-CM | POA: Diagnosis not present

## 2020-05-09 DIAGNOSIS — J9811 Atelectasis: Secondary | ICD-10-CM | POA: Diagnosis not present

## 2020-05-09 DIAGNOSIS — R0689 Other abnormalities of breathing: Secondary | ICD-10-CM | POA: Diagnosis not present

## 2020-05-09 DIAGNOSIS — Z87891 Personal history of nicotine dependence: Secondary | ICD-10-CM

## 2020-05-09 DIAGNOSIS — N179 Acute kidney failure, unspecified: Secondary | ICD-10-CM | POA: Diagnosis not present

## 2020-05-09 DIAGNOSIS — R000 Tachycardia, unspecified: Secondary | ICD-10-CM | POA: Diagnosis not present

## 2020-05-09 DIAGNOSIS — E1122 Type 2 diabetes mellitus with diabetic chronic kidney disease: Secondary | ICD-10-CM | POA: Diagnosis present

## 2020-05-09 DIAGNOSIS — M549 Dorsalgia, unspecified: Secondary | ICD-10-CM | POA: Diagnosis not present

## 2020-05-09 DIAGNOSIS — I472 Ventricular tachycardia, unspecified: Secondary | ICD-10-CM

## 2020-05-09 DIAGNOSIS — I10 Essential (primary) hypertension: Secondary | ICD-10-CM

## 2020-05-09 DIAGNOSIS — I1310 Hypertensive heart and chronic kidney disease without heart failure, with stage 1 through stage 4 chronic kidney disease, or unspecified chronic kidney disease: Secondary | ICD-10-CM | POA: Diagnosis not present

## 2020-05-09 DIAGNOSIS — Q8789 Other specified congenital malformation syndromes, not elsewhere classified: Secondary | ICD-10-CM | POA: Insufficient documentation

## 2020-05-09 DIAGNOSIS — J811 Chronic pulmonary edema: Secondary | ICD-10-CM | POA: Diagnosis not present

## 2020-05-09 DIAGNOSIS — Z9981 Dependence on supplemental oxygen: Secondary | ICD-10-CM

## 2020-05-09 DIAGNOSIS — I110 Hypertensive heart disease with heart failure: Secondary | ICD-10-CM | POA: Diagnosis not present

## 2020-05-09 DIAGNOSIS — R001 Bradycardia, unspecified: Secondary | ICD-10-CM | POA: Diagnosis not present

## 2020-05-09 DIAGNOSIS — Z6832 Body mass index (BMI) 32.0-32.9, adult: Secondary | ICD-10-CM

## 2020-05-09 DIAGNOSIS — I130 Hypertensive heart and chronic kidney disease with heart failure and stage 1 through stage 4 chronic kidney disease, or unspecified chronic kidney disease: Secondary | ICD-10-CM | POA: Diagnosis not present

## 2020-05-09 DIAGNOSIS — R569 Unspecified convulsions: Secondary | ICD-10-CM | POA: Diagnosis not present

## 2020-05-09 DIAGNOSIS — I248 Other forms of acute ischemic heart disease: Secondary | ICD-10-CM | POA: Diagnosis not present

## 2020-05-09 DIAGNOSIS — Z880 Allergy status to penicillin: Secondary | ICD-10-CM

## 2020-05-09 DIAGNOSIS — I214 Non-ST elevation (NSTEMI) myocardial infarction: Secondary | ICD-10-CM | POA: Diagnosis not present

## 2020-05-09 DIAGNOSIS — I2510 Atherosclerotic heart disease of native coronary artery without angina pectoris: Secondary | ICD-10-CM | POA: Diagnosis not present

## 2020-05-09 DIAGNOSIS — F22 Delusional disorders: Secondary | ICD-10-CM | POA: Diagnosis not present

## 2020-05-09 DIAGNOSIS — D696 Thrombocytopenia, unspecified: Secondary | ICD-10-CM | POA: Diagnosis present

## 2020-05-09 DIAGNOSIS — J9611 Chronic respiratory failure with hypoxia: Secondary | ICD-10-CM | POA: Diagnosis present

## 2020-05-09 DIAGNOSIS — K7581 Nonalcoholic steatohepatitis (NASH): Secondary | ICD-10-CM | POA: Diagnosis present

## 2020-05-09 DIAGNOSIS — I517 Cardiomegaly: Secondary | ICD-10-CM | POA: Diagnosis not present

## 2020-05-09 DIAGNOSIS — R570 Cardiogenic shock: Secondary | ICD-10-CM | POA: Diagnosis present

## 2020-05-09 DIAGNOSIS — D631 Anemia in chronic kidney disease: Secondary | ICD-10-CM | POA: Diagnosis present

## 2020-05-09 DIAGNOSIS — I4729 Other ventricular tachycardia: Secondary | ICD-10-CM

## 2020-05-09 DIAGNOSIS — I509 Heart failure, unspecified: Secondary | ICD-10-CM

## 2020-05-09 DIAGNOSIS — Z8701 Personal history of pneumonia (recurrent): Secondary | ICD-10-CM

## 2020-05-09 DIAGNOSIS — Z66 Do not resuscitate: Secondary | ICD-10-CM | POA: Diagnosis not present

## 2020-05-09 DIAGNOSIS — G8929 Other chronic pain: Secondary | ICD-10-CM | POA: Diagnosis present

## 2020-05-09 DIAGNOSIS — E7220 Disorder of urea cycle metabolism, unspecified: Secondary | ICD-10-CM | POA: Diagnosis not present

## 2020-05-09 DIAGNOSIS — I959 Hypotension, unspecified: Secondary | ICD-10-CM | POA: Diagnosis not present

## 2020-05-09 DIAGNOSIS — Z83511 Family history of glaucoma: Secondary | ICD-10-CM

## 2020-05-09 DIAGNOSIS — D6959 Other secondary thrombocytopenia: Secondary | ICD-10-CM | POA: Diagnosis present

## 2020-05-09 DIAGNOSIS — T473X5A Adverse effect of saline and osmotic laxatives, initial encounter: Secondary | ICD-10-CM | POA: Diagnosis not present

## 2020-05-09 DIAGNOSIS — Z90710 Acquired absence of both cervix and uterus: Secondary | ICD-10-CM

## 2020-05-09 DIAGNOSIS — I340 Nonrheumatic mitral (valve) insufficiency: Secondary | ICD-10-CM | POA: Diagnosis present

## 2020-05-09 DIAGNOSIS — Z45018 Encounter for adjustment and management of other part of cardiac pacemaker: Secondary | ICD-10-CM

## 2020-05-09 DIAGNOSIS — B370 Candidal stomatitis: Secondary | ICD-10-CM | POA: Diagnosis not present

## 2020-05-09 DIAGNOSIS — N1831 Chronic kidney disease, stage 3a: Secondary | ICD-10-CM | POA: Diagnosis not present

## 2020-05-09 DIAGNOSIS — K7290 Hepatic failure, unspecified without coma: Secondary | ICD-10-CM | POA: Diagnosis not present

## 2020-05-09 DIAGNOSIS — J90 Pleural effusion, not elsewhere classified: Secondary | ICD-10-CM | POA: Diagnosis not present

## 2020-05-09 DIAGNOSIS — Z7189 Other specified counseling: Secondary | ICD-10-CM | POA: Diagnosis not present

## 2020-05-09 DIAGNOSIS — H35033 Hypertensive retinopathy, bilateral: Secondary | ICD-10-CM | POA: Diagnosis present

## 2020-05-09 DIAGNOSIS — I2489 Other forms of acute ischemic heart disease: Secondary | ICD-10-CM | POA: Insufficient documentation

## 2020-05-09 HISTORY — PX: TEMPORARY PACEMAKER: CATH118268

## 2020-05-09 LAB — COMPREHENSIVE METABOLIC PANEL
ALT: 24 U/L (ref 0–44)
ALT: 24 U/L (ref 0–44)
AST: 52 U/L — ABNORMAL HIGH (ref 15–41)
AST: 54 U/L — ABNORMAL HIGH (ref 15–41)
Albumin: 2.6 g/dL — ABNORMAL LOW (ref 3.5–5.0)
Albumin: 2.7 g/dL — ABNORMAL LOW (ref 3.5–5.0)
Alkaline Phosphatase: 136 U/L — ABNORMAL HIGH (ref 38–126)
Alkaline Phosphatase: 136 U/L — ABNORMAL HIGH (ref 38–126)
Anion gap: 16 — ABNORMAL HIGH (ref 5–15)
Anion gap: 12 (ref 5–15)
BUN: 36 mg/dL — ABNORMAL HIGH (ref 8–23)
BUN: 36 mg/dL — ABNORMAL HIGH (ref 8–23)
CO2: 23 mmol/L (ref 22–32)
CO2: 24 mmol/L (ref 22–32)
Calcium: 9.1 mg/dL (ref 8.9–10.3)
Calcium: 9.3 mg/dL (ref 8.9–10.3)
Chloride: 100 mmol/L (ref 98–111)
Chloride: 98 mmol/L (ref 98–111)
Creatinine, Ser: 2.97 mg/dL — ABNORMAL HIGH (ref 0.44–1.00)
Creatinine, Ser: 3.01 mg/dL — ABNORMAL HIGH (ref 0.44–1.00)
GFR, Estimated: 16 mL/min — ABNORMAL LOW (ref >60)
GFR, Estimated: 16 mL/min — ABNORMAL LOW (ref >60)
Glucose, Bld: 111 mg/dL — ABNORMAL HIGH (ref 70–99)
Glucose, Bld: 161 mg/dL — ABNORMAL HIGH (ref 70–99)
Potassium: 4.3 mmol/L (ref 3.5–5.1)
Potassium: 4.6 mmol/L (ref 3.5–5.1)
Sodium: 139 mmol/L (ref 135–145)
Sodium: 134 mmol/L — ABNORMAL LOW (ref 135–145)
Total Bilirubin: 7.3 mg/dL — ABNORMAL HIGH (ref 0.3–1.2)
Total Bilirubin: 9.3 mg/dL — ABNORMAL HIGH (ref 0.3–1.2)
Total Protein: 6.7 g/dL (ref 6.5–8.1)
Total Protein: 7.1 g/dL (ref 6.5–8.1)

## 2020-05-09 LAB — CBC WITH DIFFERENTIAL/PLATELET
Abs Immature Granulocytes: 0.04 10*3/uL (ref 0.00–0.07)
Basophils Absolute: 0.1 10*3/uL (ref 0.0–0.1)
Basophils Relative: 1 %
Eosinophils Absolute: 0.2 10*3/uL (ref 0.0–0.5)
Eosinophils Relative: 3 %
HCT: 37 % (ref 36.0–46.0)
Hemoglobin: 11.7 g/dL — ABNORMAL LOW (ref 12.0–15.0)
Immature Granulocytes: 1 %
Lymphocytes Relative: 14 %
Lymphs Abs: 0.8 10*3/uL (ref 0.7–4.0)
MCH: 30.5 pg (ref 26.0–34.0)
MCHC: 31.6 g/dL (ref 30.0–36.0)
MCV: 96.6 fL (ref 80.0–100.0)
Monocytes Absolute: 0.6 10*3/uL (ref 0.1–1.0)
Monocytes Relative: 10 %
Neutro Abs: 3.9 10*3/uL (ref 1.7–7.7)
Neutrophils Relative %: 71 %
Platelets: 67 10*3/uL — ABNORMAL LOW (ref 150–400)
RBC: 3.83 MIL/uL — ABNORMAL LOW (ref 3.87–5.11)
RDW: 18.4 % — ABNORMAL HIGH (ref 11.5–15.5)
WBC: 5.5 10*3/uL (ref 4.0–10.5)
nRBC: 0 % (ref 0.0–0.2)

## 2020-05-09 LAB — ECHOCARDIOGRAM COMPLETE
AR max vel: 2.65 cm2
AV Area VTI: 3.12 cm2
AV Area mean vel: 2.78 cm2
AV Mean grad: 8 mmHg
AV Peak grad: 19 mmHg
Ao pk vel: 2.18 m/s
Area-P 1/2: 7.16 cm2
Height: 62 in
MV VTI: 2.12 cm2
S' Lateral: 4.7 cm
Weight: 2608 oz

## 2020-05-09 LAB — LACTIC ACID, PLASMA
Lactic Acid, Venous: 6.9 mmol/L (ref 0.5–1.9)
Lactic Acid, Venous: 3.8 mmol/L (ref 0.5–1.9)

## 2020-05-09 LAB — RESP PANEL BY RT-PCR (FLU A&B, COVID) ARPGX2
Influenza A by PCR: NEGATIVE
Influenza B by PCR: NEGATIVE
SARS Coronavirus 2 by RT PCR: NEGATIVE

## 2020-05-09 LAB — POCT I-STAT EG7
Acid-Base Excess: 1 mmol/L (ref 0.0–2.0)
Bicarbonate: 26.2 mmol/L (ref 20.0–28.0)
Calcium, Ion: 1.19 mmol/L (ref 1.15–1.40)
HCT: 38 % (ref 36.0–46.0)
Hemoglobin: 12.9 g/dL (ref 12.0–15.0)
O2 Saturation: 79 %
Patient temperature: 99.2
Potassium: 4.7 mmol/L (ref 3.5–5.1)
Sodium: 140 mmol/L (ref 135–145)
TCO2: 27 mmol/L (ref 22–32)
pCO2, Ven: 42.1 mmHg — ABNORMAL LOW (ref 44.0–60.0)
pH, Ven: 7.403 (ref 7.250–7.430)
pO2, Ven: 44 mmHg (ref 32.0–45.0)

## 2020-05-09 LAB — CBC
HCT: 37.2 % (ref 36.0–46.0)
Hemoglobin: 12 g/dL (ref 12.0–15.0)
MCH: 30.5 pg (ref 26.0–34.0)
MCHC: 32.3 g/dL (ref 30.0–36.0)
MCV: 94.4 fL (ref 80.0–100.0)
Platelets: 89 10*3/uL — ABNORMAL LOW (ref 150–400)
RBC: 3.94 MIL/uL (ref 3.87–5.11)
RDW: 18 % — ABNORMAL HIGH (ref 11.5–15.5)
WBC: 9.4 10*3/uL (ref 4.0–10.5)
nRBC: 0.2 % (ref 0.0–0.2)

## 2020-05-09 LAB — BRAIN NATRIURETIC PEPTIDE: B Natriuretic Peptide: 488 pg/mL — ABNORMAL HIGH (ref 0.0–100.0)

## 2020-05-09 LAB — TROPONIN I (HIGH SENSITIVITY)
Troponin I (High Sensitivity): 81 ng/L — ABNORMAL HIGH (ref <18)
Troponin I (High Sensitivity): 86 ng/L — ABNORMAL HIGH (ref <18)

## 2020-05-09 LAB — MRSA PCR SCREENING: MRSA by PCR: NEGATIVE

## 2020-05-09 LAB — MAGNESIUM: Magnesium: 1.9 mg/dL (ref 1.7–2.4)

## 2020-05-09 SURGERY — TEMPORARY PACEMAKER
Anesthesia: LOCAL

## 2020-05-09 MED ORDER — SENNOSIDES-DOCUSATE SODIUM 8.6-50 MG PO TABS
2.0000 | ORAL_TABLET | Freq: Every day | ORAL | Status: DC
  Administered 2020-05-10 – 2020-05-16 (×7): 2 via ORAL
  Filled 2020-05-09 (×8): qty 2

## 2020-05-09 MED ORDER — LIDOCAINE 5 % EX PTCH: 1.0000 | MEDICATED_PATCH | CUTANEOUS | Status: DC

## 2020-05-09 MED ORDER — LABETALOL HCL 5 MG/ML IV SOLN: 10.0000 mg | INTRAVENOUS | Status: DC | PRN

## 2020-05-09 MED ORDER — SODIUM CHLORIDE 0.9 % IV BOLUS
500.0000 mL | Freq: Once | INTRAVENOUS | Status: AC
  Administered 2020-05-09: 500 mL via INTRAVENOUS

## 2020-05-09 MED ORDER — IPRATROPIUM BROMIDE 0.06 % NA SOLN
1.0000 | Freq: Every day | NASAL | Status: DC | PRN
  Filled 2020-05-09: qty 15

## 2020-05-09 MED ORDER — ALBUTEROL SULFATE HFA 108 (90 BASE) MCG/ACT IN AERS
2.0000 | INHALATION_SPRAY | Freq: Four times a day (QID) | RESPIRATORY_TRACT | Status: DC | PRN
  Filled 2020-05-09: qty 6.7

## 2020-05-09 MED ORDER — FENTANYL CITRATE (PF) 100 MCG/2ML IJ SOLN: 50.0000 ug | Freq: Once | INTRAMUSCULAR | Status: AC

## 2020-05-09 MED ORDER — SODIUM CHLORIDE 0.9% FLUSH
3.0000 mL | INTRAVENOUS | Status: DC | PRN
  Administered 2020-05-11: 3 mL via INTRAVENOUS

## 2020-05-09 MED ORDER — IPRATROPIUM BROMIDE 0.03 % NA SOLN
1.0000 | Freq: Every day | NASAL | Status: DC | PRN
  Filled 2020-05-09: qty 30

## 2020-05-09 MED ORDER — ATROPINE SULFATE 1 MG/ML IJ SOLN: 0.5000 mg | Freq: Once | INTRAMUSCULAR | Status: AC

## 2020-05-09 MED ORDER — SODIUM CHLORIDE 0.9% FLUSH
3.0000 mL | Freq: Two times a day (BID) | INTRAVENOUS | Status: DC
  Administered 2020-05-10 – 2020-05-13 (×5): 3 mL via INTRAVENOUS

## 2020-05-09 MED ORDER — LIDOCAINE HCL (PF) 1 % IJ SOLN
INTRAMUSCULAR | Status: AC
  Filled 2020-05-09: qty 30

## 2020-05-09 MED ORDER — CHLORHEXIDINE GLUCONATE CLOTH 2 % EX PADS
6.0000 | MEDICATED_PAD | Freq: Every day | CUTANEOUS | Status: DC
  Administered 2020-05-09 – 2020-05-17 (×7): 6 via TOPICAL

## 2020-05-09 MED ORDER — ATROPINE SULFATE 1 MG/ML IJ SOLN
INTRAMUSCULAR | Status: AC
  Administered 2020-05-09: 0.5 mg via INTRAVENOUS
  Filled 2020-05-09: qty 1

## 2020-05-09 MED ORDER — ONDANSETRON HCL 4 MG/2ML IJ SOLN: 4.0000 mg | Freq: Once | INTRAMUSCULAR | Status: AC

## 2020-05-09 MED ORDER — DOPAMINE-DEXTROSE 3.2-5 MG/ML-% IV SOLN
0.0000 ug/kg/min | INTRAVENOUS | Status: DC
  Administered 2020-05-09: 5 ug/kg/min via INTRAVENOUS
  Administered 2020-05-09: 20 ug/kg/min via INTRAVENOUS
  Administered 2020-05-10: 6 ug/kg/min via INTRAVENOUS
  Filled 2020-05-09 (×3): qty 250

## 2020-05-09 MED ORDER — ACETAMINOPHEN 325 MG PO TABS
650.0000 mg | ORAL_TABLET | ORAL | Status: DC | PRN
  Administered 2020-05-12 (×3): 650 mg via ORAL
  Filled 2020-05-09 (×3): qty 2

## 2020-05-09 MED ORDER — LIDOCAINE 5 % EX PTCH
1.0000 | MEDICATED_PATCH | CUTANEOUS | Status: DC
  Administered 2020-05-09 – 2020-05-19 (×10): 1 via TRANSDERMAL
  Filled 2020-05-09 (×12): qty 1

## 2020-05-09 MED ORDER — OXYCODONE HCL 5 MG PO TABS
10.0000 mg | ORAL_TABLET | ORAL | Status: DC | PRN
  Administered 2020-05-09 – 2020-05-13 (×14): 10 mg via ORAL
  Filled 2020-05-09 (×15): qty 2

## 2020-05-09 MED ORDER — SODIUM CHLORIDE 0.9% FLUSH
3.0000 mL | INTRAVENOUS | Status: DC | PRN
  Administered 2020-05-10 – 2020-05-15 (×2): 3 mL via INTRAVENOUS

## 2020-05-09 MED ORDER — EZETIMIBE 10 MG PO TABS
10.0000 mg | ORAL_TABLET | Freq: Every day | ORAL | Status: DC
  Administered 2020-05-10 – 2020-05-15 (×6): 10 mg via ORAL
  Filled 2020-05-09 (×7): qty 1

## 2020-05-09 MED ORDER — METHOCARBAMOL 500 MG PO TABS
500.0000 mg | ORAL_TABLET | Freq: Three times a day (TID) | ORAL | Status: DC
  Administered 2020-05-09 – 2020-05-19 (×29): 500 mg via ORAL
  Filled 2020-05-09 (×30): qty 1

## 2020-05-09 MED ORDER — PERFLUTREN LIPID MICROSPHERE
1.0000 mL | INTRAVENOUS | Status: AC | PRN
  Administered 2020-05-09: 2 mL via INTRAVENOUS
  Filled 2020-05-09: qty 10

## 2020-05-09 MED ORDER — GERHARDT'S BUTT CREAM
TOPICAL_CREAM | Freq: Three times a day (TID) | CUTANEOUS | Status: DC
  Filled 2020-05-09: qty 1

## 2020-05-09 MED ORDER — SODIUM CHLORIDE 0.9 % IV SOLN
250.0000 mL | INTRAVENOUS | Status: DC | PRN
  Administered 2020-05-10: 250 mL via INTRAVENOUS

## 2020-05-09 MED ORDER — ONDANSETRON HCL 4 MG/2ML IJ SOLN
INTRAMUSCULAR | Status: AC
  Administered 2020-05-09: 4 mg via INTRAVENOUS
  Filled 2020-05-09: qty 2

## 2020-05-09 MED ORDER — HYDRALAZINE HCL 20 MG/ML IJ SOLN: 10.0000 mg | INTRAMUSCULAR | Status: DC | PRN

## 2020-05-09 MED ORDER — HEPARIN SODIUM (PORCINE) 5000 UNIT/ML IJ SOLN
5000.0000 [IU] | Freq: Three times a day (TID) | INTRAMUSCULAR | Status: DC
  Administered 2020-05-09 – 2020-05-10 (×3): 5000 [IU] via SUBCUTANEOUS
  Filled 2020-05-09 (×3): qty 1

## 2020-05-09 MED ORDER — PANTOPRAZOLE SODIUM 40 MG PO TBEC
40.0000 mg | DELAYED_RELEASE_TABLET | Freq: Every day | ORAL | Status: DC
  Administered 2020-05-10 – 2020-05-19 (×10): 40 mg via ORAL
  Filled 2020-05-09 (×10): qty 1

## 2020-05-09 MED ORDER — SODIUM CHLORIDE 0.9% FLUSH
3.0000 mL | Freq: Two times a day (BID) | INTRAVENOUS | Status: DC
  Administered 2020-05-10 (×2): 3 mL via INTRAVENOUS
  Administered 2020-05-12: 10 mL via INTRAVENOUS
  Administered 2020-05-13 – 2020-05-16 (×5): 3 mL via INTRAVENOUS

## 2020-05-09 MED ORDER — SODIUM CHLORIDE 0.9 % IV SOLN: 250.0000 mL | INTRAVENOUS | Status: DC | PRN

## 2020-05-09 MED ORDER — FENTANYL CITRATE (PF) 100 MCG/2ML IJ SOLN
INTRAMUSCULAR | Status: AC
  Administered 2020-05-09: 50 ug via INTRAVENOUS
  Filled 2020-05-09: qty 2

## 2020-05-09 MED ORDER — SODIUM CHLORIDE 0.9% FLUSH
10.0000 mL | Freq: Two times a day (BID) | INTRAVENOUS | Status: DC
  Administered 2020-05-09 – 2020-05-13 (×5): 10 mL

## 2020-05-09 MED ORDER — HEPARIN (PORCINE) IN NACL 1000-0.9 UT/500ML-% IV SOLN
INTRAVENOUS | Status: AC
  Filled 2020-05-09: qty 500

## 2020-05-09 MED ORDER — SODIUM CHLORIDE 0.9% FLUSH: 10.0000 mL | INTRAVENOUS | Status: DC | PRN

## 2020-05-09 MED ORDER — HEPARIN (PORCINE) IN NACL 1000-0.9 UT/500ML-% IV SOLN
INTRAVENOUS | Status: DC | PRN
  Administered 2020-05-09: 500 mL

## 2020-05-09 MED ORDER — LIDOCAINE HCL (PF) 1 % IJ SOLN
INTRAMUSCULAR | Status: DC | PRN
  Administered 2020-05-09: 5 mL via SUBCUTANEOUS

## 2020-05-09 SURGICAL SUPPLY — 8 items
CABLE ADAPT PACING TEMP 12FT (ADAPTER) ×1 IMPLANT
CATH S G BIP PACING (CATHETERS) ×1 IMPLANT
CATH SWAN GANZ 7F STRAIGHT (CATHETERS) ×1 IMPLANT
KIT MICROPUNCTURE NIT STIFF (SHEATH) ×1 IMPLANT
SHEATH PINNACLE 6F 10CM (SHEATH) ×1 IMPLANT
SHEATH PINNACLE 7F 10CM (SHEATH) ×1 IMPLANT
SHEATH PROBE COVER 6X72 (BAG) ×2 IMPLANT
SLEEVE REPOSITIONING LENGTH 30 (MISCELLANEOUS) ×1 IMPLANT

## 2020-05-09 NOTE — ED Triage Notes (Signed)
Pt brought in by [facility] with c/o seizure like activity and bradycardia that started this morning. EMS reports pt is not post-ictal afterwards. CBG 105 for EMS. EMS reports pt's HR would drop down into the 30's and then pt would become very dizzy and her body would become tense, but no convulsing. EMS gave Atropine and pt's HR increased to 112bpm. But then soon after her HR would drop back down to 30bpm. Pt is aware that something is happening and feels it coming on.

## 2020-05-09 NOTE — ED Notes (Signed)
[facility] at bedside 

## 2020-05-09 NOTE — Progress Notes (Signed)
Asked by Dr Johney Frame to look at chart to see if patient is a candidate for PPM should this be required.  The patient has multiple acute and chronic comorbidities which would certainly increase risks for procedures.  She is currently ill with complete heart block.  She has thrombocytopenia with Plt count 60s.  She also has acute on chronic renal failure.  If the patient is willing to proceed with pacing on Monday, then it would be reasonable to proceed with temporary pacing wire placement today to allow for better perfusion which would hopefully help her improve clinically to the point that she may be stable enough for PPM implant on Monday.   Thompson Grayer MD, [facility] 05/09/2020 3:53 PM

## 2020-05-09 NOTE — ED Notes (Addendum)
This RN witnessed along side Designer, jewellery, pt stating  " I dont want to die" pt reports to both RN's that she does want chest compression if her hear stops but does not want a breathing tube. Dr. Dina Rich made aware at this time.

## 2020-05-09 NOTE — ED Notes (Signed)
While triaging pt, RN noticed pt became very tense and stopped answering questions. Cardiac monitor showed HR of 30bpm. Dr. Dina Rich called to bedside immediately. This lasted approximately 7 seconds and then pt became responsive again with HR ranging from 35-45bpm. Pt woke up asking where she was and what was going on. Pt had no recollection of what had happened. Code cart brought to bedside and pads placed on pt's chest per Dr. Dina Rich.

## 2020-05-09 NOTE — Progress Notes (Signed)
    Was called by Dr. Dina Rich from [facility] about this patient who currently is in and out of complete heart block and short bursts of what appears to be wide-complex tachycardia potentially either related to atropine or are on the V. Tach.  On initial evaluation it would appear the patient would need a temporary wire any potential ischemic evaluation with chronic catheterization.  Initial plan was for her to come down for temp wire, however on review of the chart most notably her most recent clinic note from Mr. Kathlen Mody, [place], there is documentation in several places that she was not considered to be a pacemaker candidate in the past for sick sinus syndrome, and is DNR with potential plans for palliative evaluation in the past.  With this new finding, I feel that it is more prudent for the patient to come to [facility] for stabilization and electrophysiology consultation for formal decision on whether or not she would need a pacemaker or not.  If she is not a pacemaker candidate, then it would not make sense to place a temporary wire.  She currently has transient tenuous backup pacing with a rate of 30 bpm, and they have started dopamine infusion to avoid the requirement for temporary pacing.  We have therefore canceled the urgent temp wire in light of having the patient come for general cardiology admission with a EP consultation.  However on the weekend EP consultation is only telephonic.  The ICU team will be standing by if it is felt like she would require temporary pacemaker over the weekend.   Glenetta Hew, MD

## 2020-05-09 NOTE — Significant Event (Signed)
Patient expresses to RN that she wants to be FULL CODE at this time.   Kathaleen Grinder, RN

## 2020-05-09 NOTE — Interval H&P Note (Signed)
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    Full H&P pending.  Patient was seen by Samantha [name]-H&P and progress. ->  Please see side note by Samantha [name] from electrophysiology reference pacemaker placement. Asked by Samantha [name] to look at chart to see if patient is a candidate for PPM should this be required.   The patient has multiple acute and chronic comorbidities which would certainly increase risks for procedures.  She is currently ill with complete heart block.  She has thrombocytopenia with Plt count 60s.  She also has acute on chronic renal failure.   If the patient is willing to proceed with pacing on Monday, then it would be reasonable to proceed with temporary pacing wire placement today to allow for better perfusion which would hopefully help her improve clinically to the point that she may be stable enough for PPM implant on Monday.     Samantha Grayer MD, [place] 05/09/2020 3:53 PM     The various methods of treatment have been discussed with the patient and family. After consideration of risks, benefits and other options for treatment, the patient has consented to  Procedure(s): RIGHT HEART CATH (N/A) [place]   as a surgical intervention.  The patient's history has been reviewed, patient examined, no change in status, stable for surgery.  I have reviewed the patient's chart and labs.  Questions were answered to the patient's satisfaction.  With acute on chronic renal failure, we will forego left heart catheterization to avoid contrast.   Samantha [name]

## 2020-05-09 NOTE — ED Notes (Signed)
Pt's shirt, glasses and upper and lower dentures were given to pt's sister-in-law at bedside per pt's request.

## 2020-05-09 NOTE — ED Notes (Signed)
This RN had not received a call back from [place] at [facility] as of yet, therefore call initiated to them. Report given to [facility], RN at [facility].

## 2020-05-09 NOTE — ED Notes (Signed)
Pt reports she does want chest compressions but no intubation if the need arises. Pt did present to ED with DNR paperwork, but pt reports she feels differently at this time. Pt states, "I don't want to die". Dr. Dina Rich made aware of pt's wishes. Rushie Chestnut, RN at bedside when pt verbally told both RNs these were her wishes.

## 2020-05-09 NOTE — H&P (View-Only) (Signed)
    Was called by Dr. Dina Rich from [facility] about this patient who currently is in and out of complete heart block and short bursts of what appears to be wide-complex tachycardia potentially either related to atropine or are on the V. Tach.  On initial evaluation it would appear the patient would need a temporary wire any potential ischemic evaluation with chronic catheterization.  Initial plan was for her to come down for temp wire, however on review of the chart most notably her most recent clinic note from Mr. Kathlen Mody, [place], there is documentation in several places that she was not considered to be a pacemaker candidate in the past for sick sinus syndrome, and is DNR with potential plans for palliative evaluation in the past.  With this new finding, I feel that it is more prudent for the patient to come to [facility] CCU for stabilization and electrophysiology consultation for formal decision on whether or not she would need a pacemaker or not.  If she is not a pacemaker candidate, then it would not make sense to place a temporary wire.  She currently has transient tenuous backup pacing with a rate of 30 bpm, and they have started dopamine infusion to avoid the requirement for temporary pacing.  We have therefore canceled the urgent temp wire in light of having the patient come for general cardiology admission with a EP consultation.  However on the weekend EP consultation is only telephonic.  The ICU team will be standing by if it is felt like she would require temporary pacemaker over the weekend.   Glenetta Hew, MD

## 2020-05-09 NOTE — Progress Notes (Signed)
  Echocardiogram 2D Echocardiogram has been performed.  Samantha [name] 05/09/2020, 4:16 PM

## 2020-05-09 NOTE — ED Provider Notes (Signed)
[facility] Provider Note   CSN: [id number] Arrival date & time: 05/09/20  1216     History Chief Complaint  Patient presents with  . Bradycardia    Samantha [name] is a 70 y.o. female.  HPI   70 year old female with past medical history of liver cirrhosis, CAD status post CABG, paroxysmal atrial fibrillation who follows with Dr. Burt Knack for cardiology presents the emergency department with episodes of unresponsiveness.  I was called to bedside to evaluate the patient who was bradycardic and had a period of unresponsiveness.  This is similar to an episode that was had with EMS, they gave her 0.5 of atropine.  On arrival EKG shows what appears to be ventricular tachycardia.  While in the room the patient had a sinus pause of approximately 10 seconds, CPR was almost initiated until her heart rhythm reestablished.  Patient was bradycardic in the 30s with an appropriate blood pressure and became alert.  History otherwise limited secondary to acuity.  Of note patient does complain of back pain, this is acute on chronic, she has history of compression fractures.  Past Medical History:  Diagnosis Date  . Cataract    OU  . Chronic combined systolic and diastolic CHF (congestive heart failure) ([facility])   . Cirrhosis of liver ([place])   . CKD (chronic kidney disease), stage III ([place])   . Coronary artery disease    a. s/p CABG 2001 w/ (LIMA-OM, SVG-D1, SVG-RCA). b. h/o multiple PCIs per Dr. Antionette Char note.  . Depression   . Esophageal varices ([place])    New 2013  . Gastroesophageal reflux disease   . History of pneumonia   . Hyperlipidemia   . Hypertension   . Hypertensive retinopathy    OU  . Obesity   . Osteoarthritis   . Pancytopenia ([place])   . Paroxysmal atrial flutter ([place])    a. dx 05/2016.  [name] Persistent atrial fibrillation ([place])    a. reported in hosp 07/2016, not on anticoag due to cirrhosis and liver disease, low platelets, varices.  . Thrombocytopenia [facility])      Patient Active Problem List   Diagnosis Date Noted  . 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 L1 and L 2--Lumbar compression fracture, closed, initial encounter ([place]) 04/14/2020  . Chronic respiratory failure with hypoxia -CHF  04/13/2020  . Compression fracture of L2 lumbar vertebra ([place]) 04/12/2020  . Cholestasis, intrahepatic 03/12/2020  . Lobar pneumonia ([place]) 02/18/2020  . Hepatic cirrhosis ([place])   . Hypokalemia   . Anasarca 02/14/2020  . Cirrhosis of liver with ascites ([place]) 02/14/2020  . Volume overload 02/13/2020  . Constipation 02/11/2020  . Peripheral edema 02/03/2020  . Thrombocytopenia ([place]) 01/24/2020  . Left leg swelling 01/24/2020  . Hyperammonemia ([place]) 01/24/2020  . Prolonged QT interval 01/24/2020  . Elevated troponin 01/24/2020  . Intractable nausea and vomiting 01/23/2020  . Atypical chest pain 08/06/2019  . GERD (gastroesophageal reflux disease) 08/06/2019  . Acute respiratory failure with hypoxia ([place]) 05/23/2019  . Abdominal pain 05/20/2019  . Intractable abdominal pain 05/19/2019  . Paroxysmal atrial fibrillation ([place]) 05/19/2019  . Anxiety with depression 05/19/2019  . Sinus arrhythmia 05/19/2019  . Iron deficiency anemia 02/05/2019  . Chronic combined systolic and diastolic CHF (congestive heart failure) ([place]) 04/17/2018  . Heart failure ([place]) 04/17/2018  . Osteoarthritis of spine with radiculopathy, cervical region 01/12/2018  . Paresthesia of both hands 12/14/2017  . Recurrent falls 12/14/2017  . Closed compression fracture of first lumbar vertebra ([place]) 08/23/2017  .  Lumbar spondylosis with myelopathy 08/23/2017  . Carpal tunnel syndrome of right wrist 04/05/2017  . Rectal pain 03/06/2017  . Influenza A (H1N1) 02/22/2017  . Rectal bleeding 02/22/2017  . Closed fracture of lower end of right radius with routine healing 02/20/2017  . Injury of triangular fibrocartilage complex of right wrist 02/20/2017  . Pain 02/10/2017  . Other  cirrhosis of liver ([place]) 10/26/2016  . Persistent atrial fibrillation 08/16/2016  . Cardiomyopathy, ischemic-35-40% by cath  01/11/2015  . Obesity 09/24/2013  . Unspecified hereditary and idiopathic peripheral neuropathy 01/10/2013  . Hereditary and idiopathic peripheral neuropathy 01/10/2013  . Cirrhosis, non-alcoholic ([place]) 54/27/0623  . Esophageal varices- Plavix stopped 12/23/2011  . GI bleed 12/23/2011  . Idiopathic esophageal varices without bleeding ([place]) 12/23/2011  . Anemia 04/22/2011  . CAD (coronary artery disease) 08/21/2009  . Mixed hyperlipidemia 06/04/2008  . Essential hypertension 06/04/2008  . S/P CABG x 3- 2001 06/04/2008    Past Surgical History:  Procedure Laterality Date  . ABDOMINAL HYSTERECTOMY    . BACK SURGERY    . CARDIAC CATHETERIZATION  2004   left internal mammary artery to the obtuse marginal  was found to be small and thread like.  The two grafts were patent.  The left circumflex had 90% in-stent restenosis and cutting balloon angioplasty was performed followed by placement of a 3.0 x 94m Taxus drug -eluting stent.    .Marland KitchenCARDIAC CATHETERIZATION  2006   There was in-stent restenosis in the left circumflex and this was treated with cutting balloon angioplasty   . CARDIAC CATHETERIZATION  2008   vein graft to the to the obtuse marginal was patent, although small, left circumflex had 40% in-stent restenosis, ejection fraction 40-45%.  The patient was medically mananged.  .Marland KitchenCARDIAC CATHETERIZATION N/A 01/09/2015   Procedure: Left Heart Cath and Cors/Grafts Angiography;  Surgeon: HBelva Crome MD;  [facility];  Service: Cardiovascular;  Laterality: N/A;  . CATARACT EXTRACTION W/PHACO Right 05/17/2019   Procedure: CATARACT EXTRACTION PHACO AND INTRAOCULAR LENS PLACEMENT (IOC) (CDE: 14.99);  Surgeon: WBaruch Goldmann MD;  Location: [facility];  Service: Ophthalmology;  Laterality: Right;  . CATARACT EXTRACTION W/PHACO Left 06/10/2019   Procedure:  CATARACT EXTRACTION PHACO AND INTRAOCULAR LENS PLACEMENT (IOC);  Surgeon: WBaruch Goldmann MD;  Location: [facility];  Service: Ophthalmology;  Laterality: Left;  CDE: 11.96  . cataract sx    . COLONOSCOPY  03/29/2011   Procedure: COLONOSCOPY;  Surgeon: MJamesetta So MD;  Location: [facility];  Service: Gastroenterology;  Laterality: N/A;  . CORONARY ARTERY BYPASS GRAFT  May 31,2001   x 3 with a vein graft to the first diagonal, vein graft to the right coronary  artery, and a free left internal mammary  artery to the obtuse marginal   . ESOPHAGEAL BANDING N/A 07/04/2012   Procedure: ESOPHAGEAL BANDING;  Surgeon: NRogene Houston MD;  Location: [facility];  Service: Endoscopy;  Laterality: N/A;  . ESOPHAGEAL BANDING N/A 09/17/2012   Procedure: ESOPHAGEAL BANDING;  Surgeon: NRogene Houston MD;  [facility];  Service: Endoscopy;  Laterality: N/A;  . ESOPHAGEAL BANDING N/A 10/22/2013   Procedure: ESOPHAGEAL BANDING;  Surgeon: NRogene Houston MD;  [facility];  Service: Endoscopy;  Laterality: N/A;  . ESOPHAGEAL BANDING N/A 11/28/2014   Procedure: ESOPHAGEAL BANDING;  Surgeon: NRogene Houston MD;  [facility];  Service: Endoscopy;  Laterality: N/A;  . ESOPHAGEAL BANDING N/A 10/29/2015   Procedure:  ESOPHAGEAL BANDING;  Surgeon: Rogene Houston, MD;  [facility];  Service: Endoscopy;  Laterality: N/A;  . ESOPHAGOGASTRODUODENOSCOPY  12/20/2011   Procedure: ESOPHAGOGASTRODUODENOSCOPY (EGD);  Surgeon: Jamesetta So, MD;  [facility];  Service: Gastroenterology;  Laterality: N/A;  . ESOPHAGOGASTRODUODENOSCOPY N/A 07/04/2012   Procedure: ESOPHAGOGASTRODUODENOSCOPY (EGD);  Surgeon: Rogene Houston, MD;  [facility];  Service: Endoscopy;  Laterality: N/A;  235-moved to 255 Ann to notify pt  . ESOPHAGOGASTRODUODENOSCOPY N/A 09/17/2012   Procedure: ESOPHAGOGASTRODUODENOSCOPY (EGD);  Surgeon: Rogene Houston, MD;  [facility];   Service: Endoscopy;  Laterality: N/A;  730  . ESOPHAGOGASTRODUODENOSCOPY N/A 10/22/2013   Procedure: ESOPHAGOGASTRODUODENOSCOPY (EGD);  Surgeon: Rogene Houston, MD;  [facility];  Service: Endoscopy;  Laterality: N/A;  730  . ESOPHAGOGASTRODUODENOSCOPY N/A 11/28/2014   Procedure: ESOPHAGOGASTRODUODENOSCOPY (EGD);  Surgeon: Rogene Houston, MD;  [facility];  Service: Endoscopy;  Laterality: N/A;  1:25  . ESOPHAGOGASTRODUODENOSCOPY N/A 10/29/2015   Procedure: ESOPHAGOGASTRODUODENOSCOPY (EGD);  Surgeon: Rogene Houston, MD;  [facility];  Service: Endoscopy;  Laterality: N/A;  12:00  . ESOPHAGOGASTRODUODENOSCOPY N/A 12/12/2016   Grade 1 and 2 varices in lower third of esophagus, post variceal banding scar distal esophagus, mild portal hypertensive gastropathy, Type 1 gastroesophageal varices without bleeding, normal duodenum and second portion of duodenum.   [name] FLEXIBLE SIGMOIDOSCOPY N/A 03/20/2017   external hemorrhoids  . JOINT REPLACEMENT    . LEFT HEART CATH AND CORS/GRAFTS ANGIOGRAPHY N/A 08/15/2016   Procedure: Left Heart Cath and Cors/Grafts Angiography;  Surgeon: Jettie Booze, MD;  Location: [facility];  Service: Cardiovascular;  Laterality: N/A;  . LEFT HEART CATHETERIZATION WITH CORONARY/GRAFT ANGIOGRAM N/A 12/25/2013   Procedure: LEFT HEART CATHETERIZATION WITH Beatrix Fetters;  Surgeon: Blane Ohara, MD;  Location: [facility] C[facility] LAB;  Service: Cardiovascular;  Laterality: N/A;  . RIGHT HEART CATH N/A 05/29/2017   Procedure: RIGHT HEART CATH;  Surgeon: Jolaine Artist, MD;  Location: [facility];  Service: Cardiovascular;  Laterality: N/A;  . rotator cuff left  2007  . TONSILLECTOMY       OB History   No obstetric history on file.     Family History  Problem Relation Age of Onset  . Heart attack Mother   . Diabetes Mother   . Heart attack Father 73       cause of death  . Diabetes Father   . Coronary artery  disease Sister        CABG   . Diabetes Sister   . Glaucoma Sister   . Diabetes Brother   . Colon cancer Neg Hx     Social History   Tobacco Use  . Smoking status: Former Smoker    Packs/day: 0.50    Years: 20.00    Pack years: 10.00    Types: Cigarettes    Quit date: 07/21/1995    Years since quitting: 24.8  . Smokeless tobacco: Never Used  Vaping Use  . Vaping Use: Never used  Substance Use Topics  . Alcohol use: No  . Drug use: No    Home Medications Prior to Admission medications   Medication Sig Start Date End Date Taking? Authorizing Provider  albuterol (PROVENTIL HFA;VENTOLIN HFA) 108 (90 BASE) MCG/ACT inhaler Inhale 2 puffs into the lungs every 6 (six) hours as needed for wheezing or shortness of breath.    Yes [provider]  ezetimibe (  ZETIA) 10 MG tablet Take 10 mg by mouth at bedtime.   Yes [provider]  FLUoxetine (PROZAC) 20 MG capsule Take 20 mg by mouth daily.    Yes [provider]  ipratropium (ATROVENT) 0.03 % nasal spray Place 1 spray into both nostrils daily as needed (allergies).  12/23/19  Yes [provider]  metoprolol tartrate (LOPRESSOR) 25 MG tablet Take 0.5 tablets (12.5 mg total) by mouth daily as needed (palpitations). Only take if your heart rate is over 120 beats per minute. 03/31/20  Yes Weaver, Scott T, PA-C  NITROSTAT 0.4 MG SL tablet Place 0.4 mg under the tongue every 5 (five) minutes as needed for chest pain (x 3 tabs daily).  11/15/13  Yes [provider]  ondansetron (ZOFRAN ODT) 4 MG disintegrating tablet Take 1 tablet (4 mg total) by mouth every 8 (eight) hours as needed for nausea or vomiting. 02/04/20  Yes Shah, Pratik D, DO  Oxycodone HCl 10 MG TABS Take 1 tablet (10 mg total) by mouth every 4 (four) hours as needed. Patient taking differently: Take 10 mg by mouth every 4 (four) hours as needed (pain). 04/15/20  Yes Emokpae, Courage, MD  pantoprazole (PROTONIX) 40 MG tablet Take 40 mg by  mouth daily.   Yes [provider]  polyethylene glycol (MIRALAX / GLYCOLAX) 17 g packet Take 17 g by mouth daily. 03/12/20  Yes Rehman, Mechele Dawley, MD  potassium chloride SA (KLOR-CON) 20 MEQ tablet Take 40 mEq by mouth daily.   Yes [provider]  spironolactone (ALDACTONE) 25 MG tablet Take 25 mg by mouth daily.   Yes [provider]  torsemide (DEMADEX) 20 MG tablet Take 40 mg by mouth daily.   Yes [provider]  lidocaine (LIDODERM) 5 % Place 1 patch onto the skin daily. Remove & Discard patch within 12 hours or as directed by MD Patient not taking: No sig reported 04/15/20   Roxan Hockey, MD  methocarbamol (ROBAXIN) 500 MG tablet Take 1 tablet (500 mg total) by mouth 3 (three) times daily. 04/15/20   Roxan Hockey, MD  senna-docusate (SENOKOT-S) 8.6-50 MG tablet Take 2 tablets by mouth at bedtime. Patient not taking: No sig reported 04/15/20 04/15/21  Roxan Hockey, MD    Allergies    Entresto [sacubitril-valsartan], Vibra-tab [doxycycline], Acetaminophen, Ace inhibitors, Cefaclor, Cephalexin, Penicillins, Pregabalin, and Tape  Review of Systems   Review of Systems  Unable to perform ROS: Acuity of condition    Physical Exam Updated Vital Signs BP (!) 92/34   Pulse (!) 40   Temp 98 F (36.7 C) (Oral)   Resp 12   Ht 5' 2"  (1.575 m)   Wt 73.9 kg   LMP 03/29/2011   SpO2 92%   BMI 29.81 kg/m   Physical Exam Vitals and nursing note reviewed.  Constitutional:      Appearance: Normal appearance.  HENT:     Head: Normocephalic.     Mouth/Throat:     Mouth: Mucous membranes are dry.  Eyes:     Pupils: Pupils are equal, round, and reactive to light.  Cardiovascular:     Comments: Irregular, sinus pauses, episodes of ventricular tachycardia Pulmonary:     Effort: Pulmonary effort is normal. No respiratory distress.  Abdominal:     Palpations: Abdomen is soft.  Skin:    General: Skin is warm.     Coloration: Skin is jaundiced.   Neurological:     Mental Status: She is alert and oriented  to person, place, and time. Mental status is at baseline.  Psychiatric:        Mood and Affect: Mood normal.     ED Results / Procedures / Treatments   Labs (all labs ordered are listed, but only abnormal results are displayed) Labs Reviewed  BRAIN NATRIURETIC PEPTIDE - Abnormal; Notable for the following components:      Result Value   B Natriuretic Peptide 488.0 (*)    All other components within normal limits  RESP PANEL BY RT-PCR (FLU A&B, COVID) ARPGX2  CBC WITH DIFFERENTIAL/PLATELET  COMPREHENSIVE METABOLIC PANEL  MAGNESIUM  TROPONIN I (HIGH SENSITIVITY)    EKG None  Radiology No results found.  Procedures .Critical Care Performed by: Lorelle Gibbs, DO Authorized by: Lorelle Gibbs, DO   Critical care provider statement:    Critical care time (minutes):  60   Critical care was time spent personally by me on the following activities:  Discussions with consultants, evaluation of patient's response to treatment, examination of patient, ordering and performing treatments and interventions, ordering and review of laboratory studies, ordering and review of radiographic studies, pulse oximetry, re-evaluation of patient's condition, obtaining history from patient or surrogate and review of old charts   I assumed direction of critical care for this patient from another provider in my specialty: no   External pacer  Date/Time: 05/09/2020 2:03 PM Performed by: Lorelle Gibbs, DO Authorized by: Lorelle Gibbs, DO  Consent: Verbal consent obtained. Consent given by: patient Local anesthesia used: no  Anesthesia: Local anesthesia used: no  Sedation: Patient sedated: yes Analgesia: fentanyl       Medications Ordered in ED Medications  DOPamine (INTROPIN) 800 mg in dextrose 5 % 250 mL (3.2 mg/mL) infusion (0 mcg/kg/min  73.9 kg Intravenous Transfusing/Transfer 05/09/20 1340)  atropine injection  0.5 mg (0.5 mg Intravenous Given 05/09/20 1233)  fentaNYL (SUBLIMAZE) injection 50 mcg (50 mcg Intravenous Given by Other 05/09/20 1251)  sodium chloride 0.9 % bolus 500 mL (0 mLs Intravenous Stopped 05/09/20 1336)  ondansetron (ZOFRAN) injection 4 mg (4 mg Intravenous Given 05/09/20 1329)    ED Course  I have reviewed the triage vital signs and the nursing notes.  Pertinent labs & imaging results that were available during my care of the patient were reviewed by me and considered in my medical decision making (see chart for details).    MDM Rules/Calculators/A&P                          Dr. Ellyn Hack, interventional cardiology contacted on patient's arrival for concern of abnormal rhythm in the setting of an ischemic heart disease.  We believe that the patient is in a third-degree heart block with episodes of ventricular tachycardia.  When her heart rate is in the 30s to 40s she is awake, conversational.  We sets the pacer modality due to pacer if her heart rate drops below 30 bpm.  This is captured multiple times and has avoided the sinus pauses that we saw originally.  She continues to have runs of ventricular tachycardia but stays stable throughout these episodes.  Dopamine was added to her regimen, heart rate seemed to improve into the 40s.  Patient did get a dose of 50 mg of fentanyl when we had initially started pacing her.  In her chart she is listed as DNR/DNI.  Patient states that she rescinded the DNR and is okay with chest compressions at this time but  is adamant that she does not want intubation.  Risks associated with this discussed.  Plan for emergent transfer to ICU at [facility].  Dr. Ellyn Hack is excepting.  Patient transported via [facility] with fluids and dopamine running, pacer pads in place.  She was sitting up and awake.  Final Clinical Impression(s) / ED Diagnoses Final diagnoses:  None    Rx / DC Orders ED Discharge Orders    None       Lorelle Gibbs, DO 05/09/20  1404

## 2020-05-09 NOTE — ED Notes (Signed)
Attempted to call report to receiving RN at [facility]. Charge RN answered the phone and reported she was getting other patients moved out to make room for this incoming pt and she would have to call me back. Charge RN made aware pt was coming emergency traffic with [place] from [place].

## 2020-05-09 NOTE — H&P (Addendum)
Cardiology Admission History and Physical:   Samantha Samantha: SAIDAH KEMPTON MRN: [id number]; DOB: 01-Nov-1950   Admission date: 05/09/2020  PCP:  [name], MD   [place]  Cardiologist:  Sherren Mocha, MD   Chief Complaint:  bradycardia  Samantha Profile:   Samantha Samantha is a 70 y.o. female with hx of CABG, chronic systolic heart failure, DM, atrial flutter/persistent A. fib deemed not a candidate for anticoagulation due to cirrhosis and varices, SSS, hypertension, hyperlipidemia, COPD with chronic respiratory failure on O2 at home, PAD,  History of Present Illness:   Samantha Samantha has a history of CAD and is status post CABG in 2001 with subsequent multiple PCI procedures.  She has nonalcoholic cirrhosis with esophageal varices and thrombocytopenia.  She has known heart failure with reduced ejection fraction as low as 20 to 35%.  She has followed with Dr.Bensimhon in Samantha AHF clinic.  EF improved to 50 to 55% in March 2021, although subsequent echocardiogram in November 2021 showed an EF of 35 to 40%.   She had 2 admissions in December 2021 with acute on chronic CHF.  Due to a posttermination pause of 6 seconds, her beta-blocker was discontinued.  Goal-directed medical therapy for her heart failure has been complicated by low blood pressure.  She was recently seen in clinic by Richardson Dopp, PA-C on 04/08/2020.  She had gained 11 pounds since her last visit and reported worsening leg edema, 2 pillow orthopnea, with occasional chest pain treated with as needed nitro.  She had been given metoprolol as needed for high heart rates.  She felt near syncopal when she took as needed metoprolol but had not had a complete loss of consciousness.  Mr. Kathlen Mody increased her torsemide back to 40 mg twice daily with potassium supplementation.  He discontinued isosorbide with these changes.  He has had a history of atrial fibrillation with difficult to control rates.  Samantha Samantha was  brought to [facility], ED today for periods of unresponsiveness at home.  Per EDP note, she received 0.5 mg of atropine in route by EMS.  Samantha Samantha was found to be profoundly bradycardic.  On arrival, Samantha appeared to be in VT and then had a 10-second pause.  CPR was almost initiated but heart rhythm was reestablished.  Samantha was significantly bradycardic in Samantha 30s and became alert.  Telemetry felt to be consistent with complete heart block.  Dr. Ellyn Hack, interventionalist on-call, was consulted and agreed with transfer to [facility], ICU for consideration of temporary pacing wire.  During my interview, Samantha is currently on dopamine drip with a heart rate in Samantha 40s, PVCs, NSVT, pressure SBP 108.  Samantha appears to be in complete heart block.  I was able to discuss her DNR status, and Samantha wishes to be full code at this time.  She does report chest pain across her chest that is different than her normal angina.  She feels as though she is going to syncopize in bed.   Past Medical History:  Diagnosis Date  . Cataract    OU  . Chronic combined systolic and diastolic CHF (congestive heart failure) ([place])   . Cirrhosis of liver ([place])   . CKD (chronic kidney disease), stage III ([place])   . Coronary artery disease    a. s/p CABG 2001 w/ (LIMA-OM, SVG-D1, SVG-RCA). b. h/o multiple PCIs per Dr. Antionette Char note.  . Depression   . Esophageal varices ([place])    New 2013  .  Gastroesophageal reflux disease   . History of pneumonia   . Hyperlipidemia   . Hypertension   . Hypertensive retinopathy    OU  . Obesity   . Osteoarthritis   . Pancytopenia ([place])   . Paroxysmal atrial flutter ([place])    a. dx 05/2016.  [name] Persistent atrial fibrillation ([place])    a. reported in hosp 07/2016, not on anticoag due to cirrhosis and liver disease, low platelets, varices.  . Thrombocytopenia ([place])     Past Surgical History:  Procedure Laterality Date  . ABDOMINAL HYSTERECTOMY    . BACK SURGERY    . CARDIAC  CATHETERIZATION  2004   left internal mammary artery to Samantha obtuse marginal  was found to be small and thread like.  Samantha two grafts were patent.  Samantha left circumflex had 90% in-stent restenosis and cutting balloon angioplasty was performed followed by placement of a 3.0 x 28m Taxus drug -eluting stent.    .Marland KitchenCARDIAC CATHETERIZATION  2006   There was in-stent restenosis in Samantha left circumflex and this was treated with cutting balloon angioplasty   . CARDIAC CATHETERIZATION  2008   vein graft to Samantha to Samantha obtuse marginal was patent, although small, left circumflex had 40% in-stent restenosis, ejection fraction 40-45%.  Samantha Samantha was medically mananged.  .Marland KitchenCARDIAC CATHETERIZATION N/A 01/09/2015   Procedure: Left Heart Cath and Cors/Grafts Angiography;  Surgeon: HBelva Crome MD;  [facility];  Service: Cardiovascular;  Laterality: N/A;  . CATARACT EXTRACTION W/PHACO Right 05/17/2019   Procedure: CATARACT EXTRACTION PHACO AND INTRAOCULAR LENS PLACEMENT (IOC) (CDE: 14.99);  Surgeon: WBaruch Goldmann MD;  Location: [facility];  Service: Ophthalmology;  Laterality: Right;  . CATARACT EXTRACTION W/PHACO Left 06/10/2019   Procedure: CATARACT EXTRACTION PHACO AND INTRAOCULAR LENS PLACEMENT (IOC);  Surgeon: WBaruch Goldmann MD;  Location: [facility];  Service: Ophthalmology;  Laterality: Left;  CDE: 11.96  . cataract sx    . COLONOSCOPY  03/29/2011   Procedure: COLONOSCOPY;  Surgeon: MJamesetta So MD;  Location: [facility];  Service: Gastroenterology;  Laterality: N/A;  . CORONARY ARTERY BYPASS GRAFT  May 31,2001   x 3 with a vein graft to Samantha first diagonal, vein graft to Samantha right coronary  artery, and a free left internal mammary  artery to Samantha obtuse marginal   . ESOPHAGEAL BANDING N/A 07/04/2012   Procedure: ESOPHAGEAL BANDING;  Surgeon: NRogene Houston MD;  [facility];  Service: Endoscopy;  Laterality: N/A;  . ESOPHAGEAL BANDING N/A 09/17/2012   Procedure: ESOPHAGEAL BANDING;   Surgeon: NRogene Houston MD;  [facility];  Service: Endoscopy;  Laterality: N/A;  . ESOPHAGEAL BANDING N/A 10/22/2013   Procedure: ESOPHAGEAL BANDING;  Surgeon: NRogene Houston MD;  [facility];  Service: Endoscopy;  Laterality: N/A;  . ESOPHAGEAL BANDING N/A 11/28/2014   Procedure: ESOPHAGEAL BANDING;  Surgeon: NRogene Houston MD;  [facility];  Service: Endoscopy;  Laterality: N/A;  . ESOPHAGEAL BANDING N/A 10/29/2015   Procedure: ESOPHAGEAL BANDING;  Surgeon: NRogene Houston MD;  [facility];  Service: Endoscopy;  Laterality: N/A;  . ESOPHAGOGASTRODUODENOSCOPY  12/20/2011   Procedure: ESOPHAGOGASTRODUODENOSCOPY (EGD);  Surgeon: MJamesetta So MD;  [facility];  Service: Gastroenterology;  Laterality: N/A;  . ESOPHAGOGASTRODUODENOSCOPY N/A 07/04/2012   Procedure: ESOPHAGOGASTRODUODENOSCOPY (EGD);  Surgeon: NRogene Houston MD;  [facility];  Service: Endoscopy;  Laterality: N/A;  235-moved to  [place] to notify pt  . ESOPHAGOGASTRODUODENOSCOPY N/A 09/17/2012   Procedure: ESOPHAGOGASTRODUODENOSCOPY (EGD);  Surgeon: Rogene Houston, MD;  [facility];  Service: Endoscopy;  Laterality: N/A;  730  . ESOPHAGOGASTRODUODENOSCOPY N/A 10/22/2013   Procedure: ESOPHAGOGASTRODUODENOSCOPY (EGD);  Surgeon: Rogene Houston, MD;  [facility];  Service: Endoscopy;  Laterality: N/A;  730  . ESOPHAGOGASTRODUODENOSCOPY N/A 11/28/2014   Procedure: ESOPHAGOGASTRODUODENOSCOPY (EGD);  Surgeon: Rogene Houston, MD;  [facility];  Service: Endoscopy;  Laterality: N/A;  1:25  . ESOPHAGOGASTRODUODENOSCOPY N/A 10/29/2015   Procedure: ESOPHAGOGASTRODUODENOSCOPY (EGD);  Surgeon: Rogene Houston, MD;  [facility];  Service: Endoscopy;  Laterality: N/A;  12:00  . ESOPHAGOGASTRODUODENOSCOPY N/A 12/12/2016   Grade 1 and 2 varices in lower third of esophagus, post variceal banding scar distal esophagus, mild portal  hypertensive gastropathy, Type 1 gastroesophageal varices without bleeding, normal duodenum and second portion of duodenum.   [name] FLEXIBLE SIGMOIDOSCOPY N/A 03/20/2017   external hemorrhoids  . JOINT REPLACEMENT    . LEFT HEART CATH AND CORS/GRAFTS ANGIOGRAPHY N/A 08/15/2016   Procedure: Left Heart Cath and Cors/Grafts Angiography;  Surgeon: Jettie Booze, MD;  Location: [place] [facility];  Service: Cardiovascular;  Laterality: N/A;  . LEFT HEART CATHETERIZATION WITH CORONARY/GRAFT ANGIOGRAM N/A 12/25/2013   Procedure: LEFT HEART CATHETERIZATION WITH Beatrix Fetters;  Surgeon: Blane Ohara, MD;  Location: [facility] CATH LAB;  Service: Cardiovascular;  Laterality: N/A;  . RIGHT HEART CATH N/A 05/29/2017   Procedure: RIGHT HEART CATH;  Surgeon: Jolaine Artist, MD;  Location: [place] [facility];  Service: Cardiovascular;  Laterality: N/A;  . rotator cuff left  2007  . TONSILLECTOMY       Medications Prior to Admission: Prior to Admission medications   Medication Sig Start Date End Date Taking? Authorizing Provider  albuterol (PROVENTIL HFA;VENTOLIN HFA) 108 (90 BASE) MCG/ACT inhaler Inhale 2 puffs into Samantha lungs every 6 (six) hours as needed for wheezing or shortness of breath.    Yes [provider]  ezetimibe (ZETIA) 10 MG tablet Take 10 mg by mouth at bedtime.   Yes [provider]  FLUoxetine (PROZAC) 20 MG capsule Take 20 mg by mouth daily.    Yes [provider]  ipratropium (ATROVENT) 0.03 % nasal spray Place 1 spray into both nostrils daily as needed (allergies).  12/23/19  Yes [provider]  metoprolol tartrate (LOPRESSOR) 25 MG tablet Take 0.5 tablets (12.5 mg total) by mouth daily as needed (palpitations). Only take if your heart rate is over 120 beats per minute. 03/31/20  Yes Weaver, Scott T, PA-C  NITROSTAT 0.4 MG SL tablet Place 0.4 mg under Samantha tongue every 5 (five) minutes as needed for chest pain (x 3 tabs daily).  11/15/13  Yes  [provider]  ondansetron (ZOFRAN ODT) 4 MG disintegrating tablet Take 1 tablet (4 mg total) by mouth every 8 (eight) hours as needed for nausea or vomiting. 02/04/20  Yes Shah, Pratik D, DO  Oxycodone HCl 10 MG TABS Take 1 tablet (10 mg total) by mouth every 4 (four) hours as needed. Samantha taking differently: Take 10 mg by mouth every 4 (four) hours as needed (pain). 04/15/20  Yes Emokpae, Courage, MD  pantoprazole (PROTONIX) 40 MG tablet Take 40 mg by mouth daily.   Yes [provider]  polyethylene glycol (MIRALAX / GLYCOLAX) 17 g packet Take 17 g by mouth daily. 03/12/20  Yes Rehman, Mechele Dawley, MD  potassium chloride SA (KLOR-CON) 20 MEQ tablet Take 40 mEq by mouth daily.   Yes [provider]  spironolactone (ALDACTONE) 25 MG tablet Take 25 mg by mouth daily.   Yes [provider]  torsemide (DEMADEX) 20 MG tablet Take 40 mg by mouth daily.   Yes [provider]  lidocaine (LIDODERM) 5 % Place 1 patch onto Samantha skin daily. Remove & Discard patch within 12 hours or as directed by MD Samantha not taking: No sig reported 04/15/20   Roxan Hockey, MD  methocarbamol (ROBAXIN) 500 MG tablet Take 1 tablet (500 mg total) by mouth 3 (three) times daily. 04/15/20   Roxan Hockey, MD  senna-docusate (SENOKOT-S) 8.6-50 MG tablet Take 2 tablets by mouth at bedtime. Samantha not taking: No sig reported 04/15/20 04/15/21  Roxan Hockey, MD     Allergies:    Allergies  Allergen Reactions  . Entresto [Sacubitril-Valsartan] Swelling    Swelling of eyes  . Vibra-Tab [Doxycycline] Nausea Only  . Acetaminophen Other (See Comments)    Liver problems  . Ace Inhibitors Other (See Comments)    UNSURE OF REACTION TYPE   . Cefaclor Itching and Rash    Pt tolerates meropenem  . Cephalexin Itching and Rash    Pt tolerates meropenem  . Penicillins Rash    Has Samantha had a PCN reaction causing immediate rash, facial/tongue/throat swelling, SOB or lightheadedness  with hypotension: YES Has Samantha had a PCN reaction causing severe rash involving mucus membranes or skin necrosis: NO Has Samantha had a PCN reaction that required hospitalization: YES Has Samantha had a PCN reaction occurring within Samantha last 10 years: NO If all of Samantha above answers are "NO", then may proceed with Cephalosporin use. Pt tolerates meropenem 02/19/20   . Pregabalin Other (See Comments)    Retains fluid  . Tape Itching and Rash    Takes skin right off with medical tape--PAPER TAPE ONLY    Social History:   Social History   Socioeconomic History  . Marital status: Divorced    Spouse name: Not on file  . Number of children: Not on file  . Years of education: Not on file  . Highest education level: Not on file  Occupational History  . Occupation: Disabled  Tobacco Use  . Smoking status: Former Smoker    Packs/day: 0.50    Years: 20.00    Pack years: 10.00    Types: Cigarettes    Quit date: 07/21/1995    Years since quitting: 24.8  . Smokeless tobacco: Never Used  Vaping Use  . Vaping Use: Never used  Substance and Sexual Activity  . Alcohol use: No  . Drug use: No  . Sexual activity: Not on file  Other Topics Concern  . Not on file  Social History Narrative   Lives in [place] by herself.     Social Determinants of Health   Financial Resource Strain: Not on file  Food Insecurity: Not on file  Transportation Needs: Not on file  Physical Activity: Not on file  Stress: Not on file  Social Connections: Not on file  Intimate Partner Violence: Not on file    Family History:   Samantha Samantha's family history includes Coronary artery disease in her sister; Diabetes in her brother, father, mother, and sister; Glaucoma in her sister; Heart attack in her mother; Heart attack (age of onset: 44) in her father. There is no history of Colon cancer.    ROS:  Please see Samantha history  of present illness.  All other ROS reviewed and negative.     Physical Exam/Data:    Vitals:   05/09/20 1445 05/09/20 1500 05/09/20 1515 05/09/20 1530  BP: (!) 109/39 (!) 110/50 (!) 102/38 (!) 111/42  Pulse: (!) 47 (!) 48 (!) 48 (!) 48  Resp: 14 11 17 13   Temp:      TempSrc:      SpO2: 95% 94% 95% 95%  Weight:      Height:        Intake/Output Summary (Last 24 hours) at 05/09/2020 1545 Last data filed at 05/09/2020 1500 Gross per 24 hour  Intake 545.6 ml  Output --  Net 545.6 ml   Last 3 Weights 05/09/2020 04/12/2020 04/08/2020  Weight (lbs) 163 lb 165 lb 171 lb 3.2 oz  Weight (kg) 73.936 kg 74.844 kg 77.656 kg     Body mass index is 29.81 kg/m.  General:  Obese female in NAD, on O2 HEENT: normal Neck: minimal JVD Vascular: No carotid bruits Cardiac:  Irregular rhythm, bradycardic rate, systolic murmur Lungs:  Respirations unlabored Abd: soft, nontender, no hepatomegaly  Ext: minimal LE edema, bruising on left leg Musculoskeletal:  No deformities, BUE and BLE strength normal and equal Skin: warm and dry  Neuro:  CNs 2-12 intact, no focal abnormalities noted Psych:  Normal affect    EKG:  Repeat EKG pending Tracings reviewed form [facility]: CHB HR 35; sinus tachycardia with HR 121, IVCD, LVH  Relevant CV Studies:  Echo pending  Echo 01/24/20: 1. Left ventricular ejection fraction, by estimation, is 35 to 40%. Samantha  left ventricle has moderately decreased function. Samantha left ventricle  demonstrates global hypokinesis. Left ventricular diastolic function could  not be evaluated. There is severe  hypokinesis of Samantha left ventricular, entire inferior wall and  inferolateral wall.  2. Right ventricular systolic function is mildly reduced. Samantha right  ventricular size is normal. There is mildly elevated pulmonary artery  systolic pressure.  3. Left atrial size was severely dilated.  4. Right atrial size was severely dilated.  5. Samantha mitral valve is normal in structure. Mild to moderate mitral valve  regurgitation.  6. Tricuspid valve regurgitation is  moderate.  7. Samantha aortic valve is tricuspid. Aortic valve regurgitation is not  visualized. Mild aortic valve sclerosis is present, with no evidence of  aortic valve stenosis.  8. Samantha inferior vena cava is normal in size with <50% respiratory  variability, suggesting right atrial pressure of 8 mmHg.   Laboratory Data:  High Sensitivity Troponin:   Recent Labs  Lab 04/12/20 1411 04/12/20 1527 04/12/20 2118 05/09/20 1307  TROPONINIHS 27* 28* 29* 81*      Chemistry Recent Labs  Lab 05/09/20 1307  NA 139  K 4.3  CL 100  CO2 23  GLUCOSE 111*  BUN 36*  CREATININE 2.97*  CALCIUM 9.1  GFRNONAA 16*  ANIONGAP 16*    Recent Labs  Lab 05/09/20 1307  PROT 6.7  ALBUMIN 2.6*  AST 52*  ALT 24  ALKPHOS 136*  BILITOT 7.3*   Hematology Recent Labs  Lab 05/09/20 1307  WBC 5.5  RBC 3.83*  HGB 11.7*  HCT 37.0  MCV 96.6  MCH 30.5  MCHC 31.6  RDW 18.4*  PLT 67*   BNP Recent Labs  Lab 05/09/20 1307  BNP 488.0*    DDimer No results for input(s): DDIMER in Samantha last 168 hours.   Radiology/Studies:  No results found.   Assessment and Plan:  Symptomatic bradycardia - pt stable on dopamine drip - EP is aware and will see Samantha Samantha   CAD s/p CABG (2001), subsequent PCI - pt is having chest pain  - hs troponin 81, delta pending - she does report chest pain that is different than her usual angina   Chronic systolic heart failure - I have stopped IV fluids - will hold diuretic and all antihypertensives for now - will need to watch for heart failure exacerbation - repeat echo pending   Murmur on exam - echo pending   Hx of Afib - documented difficult to control rates - not an anticoagulation candidate given her history of cirrhosis and varices   EP is aware.     Risk Assessment/Risk Scores:     HEAR Score (for undifferentiated chest pain):  HEAR Score: 6  New York Heart Association (NYHA) Functional Class NYHA Class IV  CHA2DS2-VASc Score  = 6  This indicates a 9.7% annual risk of stroke. Samantha Samantha's score is based upon: CHF History: Yes HTN History: Yes Diabetes History: Yes Stroke History: No Vascular Disease History: Yes Age Score: 1 Gender Score: 1    Severity of Illness: Samantha appropriate Samantha status for this Samantha is INPATIENT. Inpatient status is judged to be reasonable and necessary in order to provide Samantha required intensity of service to ensure Samantha Samantha's safety. Samantha Samantha's presenting symptoms, physical exam findings, and initial radiographic and laboratory data in Samantha context of their chronic comorbidities is felt to place them at high risk for further clinical deterioration. Furthermore, it is not anticipated that Samantha Samantha will be medically stable for discharge from Samantha hospital within 2 midnights of admission. Samantha following factors support Samantha Samantha status of inpatient.   " Samantha Samantha's presenting symptoms include symptomatic bradycardia. " Samantha worrisome physical exam findings include symptomatic bradycardia " Samantha initial radiographic and laboratory data are worrisome because of symptomatic bradycardia. " Samantha chronic co-morbidities include CABG.   * I certify that at Samantha point of admission it is my clinical judgment that Samantha Samantha will require inpatient hospital care spanning beyond 2 midnights from Samantha point of admission due to high intensity of service, high risk for further deterioration and high frequency of surveillance required.*    For questions or updates, please contact [place] Please consult [email or web address] for contact info under     Signed, Ledora Bottcher, [place]  05/09/2020 3:45 PM   Samantha seen and examined and agree with Fabian Sharp, PA as detailed above.  In brief, Samantha Samantha is a 70 year old female with history of multivessel CAD status post CABG in 2001 with subsequent multiple PCI procedures, nonalcoholic cirrhosis with esophageal varices and thrombocytopenia,  heart failure with reduced ejection fraction as low as 20 to 35%, HTN, HLD, Afib not on AC due to cirrhosis and varices, SSS, COPD on home O2 who presented to [facility] with an episode of unresponsiveness found to be profoundly bradycardic in Samantha field initially responsive to atropine. On arrival to [facility], reportedly had episode of accelerated idioventricular rhythm-->10s pause in which CPR was almost initiated and then she developed a bradycardic rhythm consistent with CHB. Samantha became alert and awake and appropriate on examination. She was subsequently transferred to [facility] hospital for consideration of TVP.  On arrival here, Samantha remains in CHB with HR 40s on dopamine gtt. SBPs low 100s. Awake and alert. Notably, has previously stated she was DNR but today she states that she is "not ready  to die" and wants to proceed with interventions if needed. After thorough discussion with Dr. Ellyn Hack and Dr. Rayann Heman, Samantha Samantha was deemed an appropriate PPM candidate, although high risk given significant comorbidities including cirrhosis, thrombocytopenia, COPD on home O2, multivessel CAD, and AKI. Discussion held with Samantha Samantha and Samantha family and they would like to proceed with TVP and PPM at this time knowing she is at elevated risk. Will defer coronary angiography given significant AKI and may consider in Samantha future if needed.  Exam: GEN: Chronically ill appearing, but alert, awake, appropriate on exam   Neck: prominent V-wave Cardiac: Bradycardic, regular, 3/6 harsh systolic murmur best heard at RUSB Respiratory: Diminished throughut GI: Soft, nontender, non-distended  MS: No edema; No deformity. Neuro:  Nonfocal  Psych: Normal affect   Plan: -Plan for emergent TVP; Samantha amenable for procedure and deemed appropriate (but high risk) for PPM placement -Continue dopamine for bradycardia/hypotension; wean after TVP as able -RHC with TVP -No ASA, AC given thrombocytopenia -No statin given  cirrhosis; can start zetia -Deferring coronaries for now given AKI; can consider in future once renal recovery  -TTE with LVEF 28-40%, diastolic and systolic MR, moderate TR, aortic sclerosis without significant stenosis, RAP 15, inferior wall, posterior wall and mid inferoseptal segment akinesis and mid anteroseptal hypokinesis--EF similar to prior; WMA new however deferring cath given AKI as above; also notably DAPT very high risk in this Samantha -EP to see tomorrow morning  Gwyndolyn Kaufman, MD

## 2020-05-10 ENCOUNTER — Inpatient Hospital Stay (HOSPITAL_COMMUNITY): Payer: Medicare Other

## 2020-05-10 DIAGNOSIS — K7460 Unspecified cirrhosis of liver: Secondary | ICD-10-CM | POA: Diagnosis not present

## 2020-05-10 DIAGNOSIS — I255 Ischemic cardiomyopathy: Secondary | ICD-10-CM | POA: Diagnosis not present

## 2020-05-10 DIAGNOSIS — I442 Atrioventricular block, complete: Secondary | ICD-10-CM | POA: Diagnosis not present

## 2020-05-10 DIAGNOSIS — I5043 Acute on chronic combined systolic (congestive) and diastolic (congestive) heart failure: Secondary | ICD-10-CM | POA: Diagnosis not present

## 2020-05-10 LAB — POCT I-STAT EG7
Acid-Base Excess: 3 mmol/L — ABNORMAL HIGH (ref 0.0–2.0)
Bicarbonate: 28.3 mmol/L — ABNORMAL HIGH (ref 20.0–28.0)
Calcium, Ion: 1.23 mmol/L (ref 1.15–1.40)
HCT: 37 % (ref 36.0–46.0)
Hemoglobin: 12.6 g/dL (ref 12.0–15.0)
O2 Saturation: 82 %
Patient temperature: 99.6
Potassium: 4.7 mmol/L (ref 3.5–5.1)
Sodium: 140 mmol/L (ref 135–145)
TCO2: 30 mmol/L (ref 22–32)
pCO2, Ven: 46.1 mmHg (ref 44.0–60.0)
pH, Ven: 7.399 (ref 7.250–7.430)
pO2, Ven: 48 mmHg — ABNORMAL HIGH (ref 32.0–45.0)

## 2020-05-10 LAB — BASIC METABOLIC PANEL
Anion gap: 11 (ref 5–15)
BUN: 37 mg/dL — ABNORMAL HIGH (ref 8–23)
CO2: 26 mmol/L (ref 22–32)
Calcium: 9.3 mg/dL (ref 8.9–10.3)
Chloride: 103 mmol/L (ref 98–111)
Creatinine, Ser: 2.79 mg/dL — ABNORMAL HIGH (ref 0.44–1.00)
GFR, Estimated: 18 mL/min — ABNORMAL LOW (ref >60)
Glucose, Bld: 111 mg/dL — ABNORMAL HIGH (ref 70–99)
Potassium: 4.9 mmol/L (ref 3.5–5.1)
Sodium: 140 mmol/L (ref 135–145)

## 2020-05-10 LAB — SURGICAL PCR SCREEN
MRSA, PCR: NEGATIVE
Staphylococcus aureus: NEGATIVE

## 2020-05-10 LAB — LACTIC ACID, PLASMA
Lactic Acid, Venous: 2.3 mmol/L (ref 0.5–1.9)
Lactic Acid, Venous: 2.4 mmol/L (ref 0.5–1.9)

## 2020-05-10 LAB — PROCALCITONIN: Procalcitonin: 0.1 ng/mL

## 2020-05-10 LAB — PROTIME-INR
INR: 2.1 — ABNORMAL HIGH (ref 0.8–1.2)
Prothrombin Time: 22.6 seconds — ABNORMAL HIGH (ref 11.4–15.2)

## 2020-05-10 MED ORDER — PNEUMOCOCCAL VAC POLYVALENT 25 MCG/0.5ML IJ INJ: 0.5000 mL | INJECTION | INTRAMUSCULAR | Status: DC | PRN

## 2020-05-10 MED ORDER — PHYTONADIONE 5 MG PO TABS
5.0000 mg | ORAL_TABLET | Freq: Once | ORAL | Status: AC
  Administered 2020-05-10: 5 mg via ORAL
  Filled 2020-05-10: qty 1

## 2020-05-10 MED ORDER — ALBUMIN HUMAN 25 % IV SOLN
12.5000 g | Freq: Four times a day (QID) | INTRAVENOUS | Status: AC
  Administered 2020-05-10 – 2020-05-12 (×7): 12.5 g via INTRAVENOUS
  Filled 2020-05-10 (×7): qty 50

## 2020-05-10 MED ORDER — GUAIFENESIN ER 600 MG PO TB12
600.0000 mg | ORAL_TABLET | Freq: Two times a day (BID) | ORAL | Status: DC
  Administered 2020-05-10 – 2020-05-19 (×19): 600 mg via ORAL
  Filled 2020-05-10 (×19): qty 1

## 2020-05-10 MED ORDER — FUROSEMIDE 10 MG/ML IJ SOLN
40.0000 mg | Freq: Once | INTRAMUSCULAR | Status: AC
  Administered 2020-05-10: 40 mg via INTRAVENOUS
  Filled 2020-05-10: qty 4

## 2020-05-10 NOTE — Progress Notes (Addendum)
Paged Cardiology.  Patient has been NS 90s -ST 119, Pacing wire at 39.  Stat CT was completed.  CT recommending repositioning of pacing wires. Matt with cardiology returned call.  Will come to bedside to assess.

## 2020-05-10 NOTE — Consult Note (Addendum)
[facility] Medical Consultation  Samantha [name] MGQ:[id number] DOB: 10/19/1950 DOA: 05/09/2020 PCP: [name], MD   Requesting physician: Dr. Johney Frame Date of consultation: 05/10/2020 Reason for consultation: CHF vs PNA  Impression/Recommendations Principal Problem:   Heart block AV complete ([place]) Active Problems:   Coronary artery disease involving native coronary artery of native heart with angina pectoris ([place])   Essential hypertension   S/P CABG x 3- 2001   Cirrhosis, non-alcoholic ([place])   Cardiomyopathy, ischemic-35-40% by cath    Persistent atrial fibrillation   Winter-Harding-Hyde syndrome   Symptomatic bradycardia   Acute on chronic combined systolic and diastolic CHF (congestive heart failure) (HCC)   Paroxysmal ventricular tachycardia ([place]) - related to Bradycardia    Acute on chronic CHF decompensation -Physical exam shows fluid overload with crackles and 2+ peripheral edema, and her weight was 72.8 kg in Jan 2022, and now is 76.6 kg. She is in cardiogenic shock, but stabilized on dopamine drip, will give 1 dose of 40 mg IV Lasix, chest CT to better characterize bilateral infiltrates. -Procalcitonin and trending -COVID negative.  Lactic acidosis, persistent -Clinically looks fluid overloaded, low blood pressure very likely from decompensated CHF/cardiogenic shock. She does have a productive cough, but more likely from a CHF decompensation other than a PNA, given there is no WBC count or left shift, no fever and pattern of infiltrate on chest xray is Bilateral and interstitial. Will monitor off ABX for now. -We will use albumin every 6 hours x2 days to hydrate given her history of liver cirrhosis. -Given her advanced compromised liver function, suspect she has a baseline elevation of lactic acid. If her BP not dropping or spiking fever, not recommending further trending.  AKI -Suspect cardiorenal syndrome, may be also has some element of hepatorenal syndrome  given her decompensated cirrhosis -Fluid overload now -IV Lasix and expect switch to PO Torsemide tomorrow.  Acute on chronic hepatic failure, now MELD=28 with a 3 month mortality 19% -Secondary to NASH, has a baseline portal HTN and gastric-renal shunt, but no Hx of GI bleed. -Compromised synthetic liver function with hypoalbuminemia, and likely coagulopathy (her past INR has always been elevated) -Her bilirubinemia has be gradually getting worse too, which can explained her concerns about darkening urine color/bilirubinuria. -Has been following with [facility], and last seen 3 wks ago.  Thrombocytopenia -D/C heparin subQ.  Symptomatic bradycardia -As per cardio team.  I will followup again tomorrow. Please contact me if I can be of assistance in the meanwhile. Thank you for this consultation.  Chief Complaint: SOB  HPI:  70 year old female patient with possible history of CAD, status post CABG x3, chronic systolic CHF LVEF 35 to 26% January 2021, NASH with cirrhosis and portal hypertension, PAF, HTN, scented with multiple falls and symptomatic bradycardia.  Heart rate was in the 40s, temporary pacemaker placed overnight.  Patient has been struggling with frequent falls, weakness last month, and she sustained a L1-L2 compression fracture April 14, 2020, since then her ambulation function has been significantly affected.  She uses oxygen at home as needed, and she noticed she is redeveloped breathing symptoms of shortness of breath with minimal ambulation at home.  She also noticed swelling of the legs.  It does appear that she has not been enforcing fluid restriction as she told me that she has been drinking more fluid than before, for the reason that "My urine has been so yellow, makes me feel dry", she admitted drink >60 oz a day.  No fever  chills no diarrhea.  Review of Systems:  14 points of review systems negative except those mentioned in HPI.  Past Medical History:  Diagnosis  Date  . Cataract    OU  . Chronic combined systolic and diastolic CHF (congestive heart failure) ([place])   . Cirrhosis of liver ([place])   . CKD (chronic kidney disease), stage III ([place])   . Coronary artery disease    a. s/p CABG 2001 w/ (LIMA-OM, SVG-D1, SVG-RCA). b. h/o multiple PCIs per Dr. Antionette Char note.  . Depression   . Esophageal varices ([place])    New 2013  . Gastroesophageal reflux disease   . History of pneumonia   . Hyperlipidemia   . Hypertension   . Hypertensive retinopathy    OU  . Obesity   . Osteoarthritis   . Pancytopenia ([place])   . Paroxysmal atrial flutter ([place])    a. dx 05/2016.  [name] Persistent atrial fibrillation ([place])    a. reported in hosp 07/2016, not on anticoag due to cirrhosis and liver disease, low platelets, varices.  . Thrombocytopenia ([place])    Past Surgical History:  Procedure Laterality Date  . ABDOMINAL HYSTERECTOMY    . BACK SURGERY    . CARDIAC CATHETERIZATION  2004   left internal mammary artery to the obtuse marginal  was found to be small and thread like.  The two grafts were patent.  The left circumflex had 90% in-stent restenosis and cutting balloon angioplasty was performed followed by placement of a 3.0 x 4m Taxus drug -eluting stent.    .Marland KitchenCARDIAC CATHETERIZATION  2006   There was in-stent restenosis in the left circumflex and this was treated with cutting balloon angioplasty   . CARDIAC CATHETERIZATION  2008   vein graft to the to the obtuse marginal was patent, although small, left circumflex had 40% in-stent restenosis, ejection fraction 40-45%.  The patient was medically mananged.  .Marland KitchenCARDIAC CATHETERIZATION N/A 01/09/2015   Procedure: Left Heart Cath and Cors/Grafts Angiography;  Surgeon: HBelva Crome MD;  [facility];  Service: Cardiovascular;  Laterality: N/A;  . CATARACT EXTRACTION W/PHACO Right 05/17/2019   Procedure: CATARACT EXTRACTION PHACO AND INTRAOCULAR LENS PLACEMENT (IOC) (CDE: 14.99);  Surgeon: WBaruch Goldmann  MD;  Location: [facility];  Service: Ophthalmology;  Laterality: Right;  . CATARACT EXTRACTION W/PHACO Left 06/10/2019   Procedure: CATARACT EXTRACTION PHACO AND INTRAOCULAR LENS PLACEMENT (IOC);  Surgeon: WBaruch Goldmann MD;  Location: [facility];  Service: Ophthalmology;  Laterality: Left;  CDE: 11.96  . cataract sx    . COLONOSCOPY  03/29/2011   Procedure: COLONOSCOPY;  Surgeon: MJamesetta So MD;  Location: [facility];  Service: Gastroenterology;  Laterality: N/A;  . CORONARY ARTERY BYPASS GRAFT  May 31,2001   x 3 with a vein graft to the first diagonal, vein graft to the right coronary  artery, and a free left internal mammary  artery to the obtuse marginal   . ESOPHAGEAL BANDING N/A 07/04/2012   Procedure: ESOPHAGEAL BANDING;  Surgeon: NRogene Houston MD;  [facility];  Service: Endoscopy;  Laterality: N/A;  . ESOPHAGEAL BANDING N/A 09/17/2012   Procedure: ESOPHAGEAL BANDING;  Surgeon: NRogene Houston MD;  [facility];  Service: Endoscopy;  Laterality: N/A;  . ESOPHAGEAL BANDING N/A 10/22/2013   Procedure: ESOPHAGEAL BANDING;  Surgeon: NRogene Houston MD;  [facility];  Service: Endoscopy;  Laterality: N/A;  . ESOPHAGEAL BANDING N/A 11/28/2014   Procedure: ESOPHAGEAL BANDING;  Surgeon: Rogene Houston, MD;  [facility];  Service: Endoscopy;  Laterality: N/A;  . ESOPHAGEAL BANDING N/A 10/29/2015   Procedure: ESOPHAGEAL BANDING;  Surgeon: Rogene Houston, MD;  [facility];  Service: Endoscopy;  Laterality: N/A;  . ESOPHAGOGASTRODUODENOSCOPY  12/20/2011   Procedure: ESOPHAGOGASTRODUODENOSCOPY (EGD);  Surgeon: Jamesetta So, MD;  [facility];  Service: Gastroenterology;  Laterality: N/A;  . ESOPHAGOGASTRODUODENOSCOPY N/A 07/04/2012   Procedure: ESOPHAGOGASTRODUODENOSCOPY (EGD);  Surgeon: Rogene Houston, MD;  [facility];  Service: Endoscopy;  Laterality: N/A;  235-moved to 255 Ann to notify pt  .  ESOPHAGOGASTRODUODENOSCOPY N/A 09/17/2012   Procedure: ESOPHAGOGASTRODUODENOSCOPY (EGD);  Surgeon: Rogene Houston, MD;  [facility];  Service: Endoscopy;  Laterality: N/A;  730  . ESOPHAGOGASTRODUODENOSCOPY N/A 10/22/2013   Procedure: ESOPHAGOGASTRODUODENOSCOPY (EGD);  Surgeon: Rogene Houston, MD;  [facility];  Service: Endoscopy;  Laterality: N/A;  730  . ESOPHAGOGASTRODUODENOSCOPY N/A 11/28/2014   Procedure: ESOPHAGOGASTRODUODENOSCOPY (EGD);  Surgeon: Rogene Houston, MD;  [facility];  Service: Endoscopy;  Laterality: N/A;  1:25  . ESOPHAGOGASTRODUODENOSCOPY N/A 10/29/2015   Procedure: ESOPHAGOGASTRODUODENOSCOPY (EGD);  Surgeon: Rogene Houston, MD;  [facility];  Service: Endoscopy;  Laterality: N/A;  12:00  . ESOPHAGOGASTRODUODENOSCOPY N/A 12/12/2016   Grade 1 and 2 varices in lower third of esophagus, post variceal banding scar distal esophagus, mild portal hypertensive gastropathy, Type 1 gastroesophageal varices without bleeding, normal duodenum and second portion of duodenum.   [name] FLEXIBLE SIGMOIDOSCOPY N/A 03/20/2017   external hemorrhoids  . JOINT REPLACEMENT    . LEFT HEART CATH AND CORS/GRAFTS ANGIOGRAPHY N/A 08/15/2016   Procedure: Left Heart Cath and Cors/Grafts Angiography;  Surgeon: Jettie Booze, MD;  Location: [facility];  Service: Cardiovascular;  Laterality: N/A;  . LEFT HEART CATHETERIZATION WITH CORONARY/GRAFT ANGIOGRAM N/A 12/25/2013   Procedure: LEFT HEART CATHETERIZATION WITH Beatrix Fetters;  Surgeon: Blane Ohara, MD;  Location: [facility];  Service: Cardiovascular;  Laterality: N/A;  . RIGHT HEART CATH N/A 05/29/2017   Procedure: RIGHT HEART CATH;  Surgeon: Jolaine Artist, MD;  Location: [facility];  Service: Cardiovascular;  Laterality: N/A;  . rotator cuff left  2007  . TONSILLECTOMY     Social History:  reports that she quit smoking about 24 years ago. Her smoking use included  cigarettes. She has a 10.00 pack-year smoking history. She has never used smokeless tobacco. She reports that she does not drink alcohol and does not use drugs.  Allergies  Allergen Reactions  . Entresto [Sacubitril-Valsartan] Swelling    Swelling of eyes  . Vibra-Tab [Doxycycline] Nausea Only  . Acetaminophen Other (See Comments)    Liver problems  . Ace Inhibitors Other (See Comments)    UNSURE OF REACTION TYPE   . Cefaclor Itching and Rash    Pt tolerates meropenem  . Cephalexin Itching and Rash    Pt tolerates meropenem  . Penicillins Rash    Has patient had a PCN reaction causing immediate rash, facial/tongue/throat swelling, SOB or lightheadedness with hypotension: YES Has patient had a PCN reaction causing severe rash involving mucus membranes or skin necrosis: NO Has patient had a PCN reaction that required hospitalization: YES Has patient had a PCN reaction occurring within the last 10 years: NO If all of the above answers are "NO", then may proceed with Cephalosporin use. Pt tolerates meropenem 02/19/20   . Pregabalin Other (  See Comments)    Retains fluid  . Tape Itching and Rash    Takes skin right off with medical tape--PAPER TAPE ONLY   Family History  Problem Relation Age of Onset  . Heart attack Mother   . Diabetes Mother   . Heart attack Father 64       cause of death  . Diabetes Father   . Coronary artery disease Sister        CABG   . Diabetes Sister   . Glaucoma Sister   . Diabetes Brother   . Colon cancer Neg Hx     Prior to Admission medications   Medication Sig Start Date End Date Taking? Authorizing Provider  albuterol (PROVENTIL HFA;VENTOLIN HFA) 108 (90 BASE) MCG/ACT inhaler Inhale 2 puffs into the lungs every 6 (six) hours as needed for wheezing or shortness of breath.    Yes [provider]  ezetimibe (ZETIA) 10 MG tablet Take 10 mg by mouth at bedtime.   Yes [provider]  FLUoxetine (PROZAC) 20 MG capsule Take 20 mg by  mouth daily.    Yes [provider]  ipratropium (ATROVENT) 0.03 % nasal spray Place 1 spray into both nostrils daily as needed (allergies).  12/23/19  Yes [provider]  metoprolol tartrate (LOPRESSOR) 25 MG tablet Take 0.5 tablets (12.5 mg total) by mouth daily as needed (palpitations). Only take if your heart rate is over 120 beats per minute. 03/31/20  Yes Weaver, Scott T, PA-C  NITROSTAT 0.4 MG SL tablet Place 0.4 mg under the tongue every 5 (five) minutes as needed for chest pain (x 3 tabs daily).  11/15/13  Yes [provider]  ondansetron (ZOFRAN ODT) 4 MG disintegrating tablet Take 1 tablet (4 mg total) by mouth every 8 (eight) hours as needed for nausea or vomiting. 02/04/20  Yes Shah, Pratik D, DO  Oxycodone HCl 10 MG TABS Take 1 tablet (10 mg total) by mouth every 4 (four) hours as needed. Patient taking differently: Take 10 mg by mouth every 4 (four) hours as needed (pain). 04/15/20  Yes Emokpae, Courage, MD  pantoprazole (PROTONIX) 40 MG tablet Take 40 mg by mouth daily.   Yes [provider]  polyethylene glycol (MIRALAX / GLYCOLAX) 17 g packet Take 17 g by mouth daily. 03/12/20  Yes Rehman, Mechele Dawley, MD  potassium chloride SA (KLOR-CON) 20 MEQ tablet Take 40 mEq by mouth daily.   Yes [provider]  spironolactone (ALDACTONE) 25 MG tablet Take 25 mg by mouth daily.   Yes [provider]  torsemide (DEMADEX) 20 MG tablet Take 40 mg by mouth daily.   Yes [provider]  lidocaine (LIDODERM) 5 % Place 1 patch onto the skin daily. Remove & Discard patch within 12 hours or as directed by MD Patient not taking: No sig reported 04/15/20   Roxan Hockey, MD  methocarbamol (ROBAXIN) 500 MG tablet Take 1 tablet (500 mg total) by mouth 3 (three) times daily. 04/15/20   Roxan Hockey, MD  senna-docusate (SENOKOT-S) 8.6-50 MG tablet Take 2 tablets by mouth at bedtime. Patient not taking: No sig reported 04/15/20 04/15/21  Roxan Hockey, MD   Physical Exam: Blood pressure (!) 105/49, pulse 70, temperature 98.5 F (36.9 C), resp. rate 18, height 5' 2"  (1.575 m), weight 76.6 kg, last menstrual period 03/29/2011, SpO2 100 %. Vitals:   05/10/20 1300 05/10/20 1315  BP: (!) 85/44 (!) 105/49  Pulse: 70 70  Resp: 11 18  Temp:    SpO2: 100% 100%     General: Chronically ill  Eyes: Jaundice  ENT: No rash  Neck: Supple activity  Cardiovascular: RRR, no murmurs, 2+ pitting edema  Respiratory: Bilateral fine crackles to the mid fields, R>L side  Abdomen: Soft, tender not distended  Skin: No rash no ulcers  Musculoskeletal: Back pain  Psychiatric: Calm  Neurologic: Non-Focal  Labs on Admission:  Basic Metabolic Panel: Recent Labs  Lab 05/09/20 1307 05/09/20 2039 05/09/20 2041 05/10/20 0102 05/10/20 0607  NA 139 134* 140 140 140  K 4.3 4.6 4.7 4.7 4.9  CL 100 98  --   --  103  CO2 23 24  --   --  26  GLUCOSE 111* 161*  --   --  111*  BUN 36* 36*  --   --  37*  CREATININE 2.97* 3.01*  --   --  2.79*  CALCIUM 9.1 9.3  --   --  9.3  MG 1.9  --   --   --   --    Liver Function Tests: Recent Labs  Lab 05/09/20 1307 05/09/20 2039  AST 52* 54*  ALT 24 24  ALKPHOS 136* 136*  BILITOT 7.3* 9.3*  PROT 6.7 7.1  ALBUMIN 2.6* 2.7*   No results for input(s): LIPASE, AMYLASE in the last 168 hours. No results for input(s): AMMONIA in the last 168 hours. CBC: Recent Labs  Lab 05/09/20 1307 05/09/20 2039 05/09/20 2041 05/10/20 0102  WBC 5.5 9.4  --   --   NEUTROABS 3.9  --   --   --   HGB 11.7* 12.0 12.9 12.6  HCT 37.0 37.2 38.0 37.0  MCV 96.6 94.4  --   --   PLT 67* 89*  --   --    Cardiac Enzymes: No results for input(s): CKTOTAL, CKMB, CKMBINDEX, TROPONINI in the last 168 hours. BNP: Invalid input(s): POCBNP CBG: No results for input(s): GLUCAP in the last 168 hours.  Radiological Exams on Admission: CARDIAC CATHETERIZATION  Result Date: 05/09/2020 SUMMARY  Complete Heart Block  with Junctional Escape Rhythm -> and intermittent AI VR  Successful right IJ Temporary Pacemaker Insertion-> pacing at the RVOT, best place for capture.  Upon insertion of the sheath, the patient went into AI VR with rates of the 110to 120s -> at that point I decided to forego right heart catheterization and just place the temporary pacemaker. => Temporary pacer used to overdrive pace out of AI VR. 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 RECOMMENDATIONS  Return to nursing unit, continue on dopamine wean as tolerated for blood pressures.  Patient is now essentially pacemaker dependent-would need backup transcutaneous pacing in place if TPM becomes unstable We will need formal EP evaluation to determine timing of PPM placement. Glenetta Hew, MD   DG CHEST PORT 1 VIEW  Result Date: 05/10/2020 CLINICAL DATA:  Shortness of breath and chest pain EXAM: PORTABLE CHEST 1 VIEW COMPARISON:  February 17, 2020 FINDINGS: There is cardiomegaly with pulmonary vascularity within normal limits. Patient is status post coronary artery bypass grafting. Apparent pacemaker lead in right atrial region. There is ill-defined airspace opacity in each upper lobe and left base region. No consolidation. No appreciable adenopathy. There is aortic atherosclerosis. No bone lesions. IMPRESSION: Ill-defined airspace opacity in each upper lobe and left base regions. Appearance felt to be indicative of multifocal pneumonia. Question atypical organism pneumonia. Check of COVID-19 status advised. Cardiomegaly. Status post coronary artery bypass grafting. Apparent  pacemaker via trans jugular approach with lead attached to right heart, likely right atrium. Aortic Atherosclerosis (ICD10-I70.0). Electronically Signed   By: Lowella Grip III M.D.   On: 05/10/2020 10:53   ECHOCARDIOGRAM COMPLETE  Result Date: 05/09/2020    ECHOCARDIOGRAM REPORT   Patient Name:   Samantha [name] Date of Exam: 05/09/2020 Medical Rec #:  [id number]      Height:        62.0 in Accession #:    [id number]     Weight:       163.0 lb Date of Birth:  1950-10-14      BSA:          1.753 m Patient Age:    55 years       BP:           105/42 mmHg Patient Gender: F              HR:           46 bpm. Exam Location:  Inpatient Procedure: 2D Echo, Cardiac Doppler, Color Doppler and Intracardiac            Opacification Agent STAT ECHO Indications:    Complete heart block  History:        Patient has prior history of Echocardiogram examinations, most                 recent 01/24/2020. CHF, CAD, Prior CABG, PAD and COPD,                 Arrythmias:Atrial Flutter; Risk Factors:Diabetes, Hypertension                 and Dyslipidemia.  Sonographer:    Dustin Flock Referring Phys: [id number] [place]  1. Left ventricular ejection fraction, by estimation, is 35 to 40%. The left ventricle has moderately decreased function. The left ventricle demonstrates global hypokinesis and RWMA as noted in findings. There is mild left ventricular hypertrophy. Left ventricular diastolic parameters are indeterminate.  2. Respiratory related septal shift.  3. Right ventricular systolic function is mildly reduced. The right ventricular size is normal. There is moderately elevated pulmonary artery systolic pressure. The estimated right ventricular systolic pressure is 25.3 mmHg.  4. Left atrial size was moderately dilated.  5. Right atrial size was moderately dilated.  6. Diastolic and systolic mitral valve regurgitation.. The mitral valve is grossly normal. Mild to moderate mitral valve regurgitation.  7. Tricuspid valve regurgitation is moderate.  8. The aortic valve is grossly normal. There is mild calcification of the aortic valve. Aortic valve regurgitation is not visualized. No aortic stenosis is present.  9. The inferior vena cava is dilated in size with <50% respiratory variability, suggesting right atrial pressure of 15 mmHg. FINDINGS  Left Ventricle: Left ventricular ejection  fraction, by estimation, is 35 to 40%. The left ventricle has moderately decreased function. The left ventricle demonstrates global hypokinesis. Definity contrast agent was given IV to delineate the left ventricular endocardial borders. The left ventricular internal cavity size was normal in size. There is mild left ventricular hypertrophy. Abnormal (paradoxical) septal motion, consistent with left bundle branch block. Left ventricular diastolic parameters are indeterminate.  LV Wall Scoring: The inferior wall, posterior wall, and mid inferoseptal segment are akinetic. The mid anteroseptal segment is hypokinetic. Right Ventricle: The right ventricular size is normal. No increase in right ventricular wall thickness. Right ventricular systolic function is mildly reduced. There is moderately elevated pulmonary artery systolic pressure.  The tricuspid regurgitant velocity is 3.08 m/s, and with an assumed right atrial pressure of 15 mmHg, the estimated right ventricular systolic pressure is 51.7 mmHg. Left Atrium: Left atrial size was moderately dilated. Right Atrium: Right atrial size was moderately dilated. Pericardium: There is no evidence of pericardial effusion. Mitral Valve: Diastolic and systolic mitral valve regurgitation. The mitral valve is grossly normal. Mild to moderate mitral valve regurgitation. MV peak gradient, 16.6 mmHg. The mean mitral valve gradient is 3.0 mmHg with average heart rate of 47 bpm. Tricuspid Valve: The tricuspid valve is normal in structure. Tricuspid valve regurgitation is moderate . No evidence of tricuspid stenosis. Aortic Valve: The aortic valve is grossly normal. There is mild calcification of the aortic valve. Aortic valve regurgitation is not visualized. No aortic stenosis is present. Aortic valve mean gradient measures 8.0 mmHg. Aortic valve peak gradient measures 19.0 mmHg. Aortic valve area, by VTI measures 3.12 cm. Pulmonic Valve: The pulmonic valve was normal in structure.  Pulmonic valve regurgitation is trivial. Aorta: The aortic root is normal in size and structure. Venous: The inferior vena cava is dilated in size with less than 50% respiratory variability, suggesting right atrial pressure of 15 mmHg. IAS/Shunts: No atrial level shunt detected by color flow Doppler.  LEFT VENTRICLE PLAX 2D LVIDd:         5.20 cm  Diastology LVIDs:         4.70 cm  LV e' medial:    6.09 cm/s LV PW:         1.20 cm  LV E/e' medial:  26.3 LV IVS:        1.20 cm  LV e' lateral:   9.68 cm/s LVOT diam:     2.30 cm  LV E/e' lateral: 16.5 LV SV:         133 LV SV Index:   76 LVOT Area:     4.15 cm  RIGHT VENTRICLE RV Basal diam:  3.30 cm RV S prime:     9.79 cm/s TAPSE (M-mode): 2.1 cm LEFT ATRIUM             Index       RIGHT ATRIUM           Index LA diam:        4.90 cm 2.80 cm/m  RA Area:     22.90 cm LA Vol (A2C):   70.9 ml 40.46 ml/m RA Volume:   75.40 ml  43.02 ml/m LA Vol (A4C):   69.3 ml 39.54 ml/m LA Biplane Vol: 73.7 ml 42.05 ml/m  AORTIC VALVE                    PULMONIC VALVE AV Area (Vmax):    2.65 cm     PV Vmax:       1.86 m/s AV Area (Vmean):   2.78 cm     PV Peak grad:  13.8 mmHg AV Area (VTI):     3.12 cm AV Vmax:           218.00 cm/s AV Vmean:          131.000 cm/s AV VTI:            0.425 m AV Peak Grad:      19.0 mmHg AV Mean Grad:      8.0 mmHg LVOT Vmax:         139.00 cm/s LVOT Vmean:        87.700 cm/s LVOT VTI:  0.319 m LVOT/AV VTI ratio: 0.75  AORTA Ao Root diam: 2.90 cm MITRAL VALVE                TRICUSPID VALVE MV Area (PHT): 7.16 cm     TR Peak grad:   37.9 mmHg MV Area VTI:   2.12 cm     TR Vmax:        308.00 cm/s MV Peak grad:  16.6 mmHg MV Mean grad:  3.0 mmHg     SHUNTS MV Vmax:       2.04 m/s     Systemic VTI:  0.32 m MV Vmean:      91.0 cm/s    Systemic Diam: 2.30 cm MV Decel Time: 106 msec MV E velocity: 160.00 cm/s MV A velocity: 131.00 cm/s MV E/A ratio:  1.22 Cherlynn Kaiser MD Electronically signed by Cherlynn Kaiser MD Signature Date/Time:  05/09/2020/5:23:45 PM    Final     EKG: Independently reviewed. paced  Time spent: [facility] Pager [phone] 05/10/2020, 4:05 PM

## 2020-05-10 NOTE — Progress Notes (Signed)
ELECTROPHYSIOLOGY CONSULT NOTE    Primary Care Physician: [name], MD Referring Physician:  Dr Johney Frame  Admit Date: 05/09/2020  Reason for consultation:  AV block  Samantha [name] is a 70 y.o. female with a h/o cirrhosis, thrombocytopenia, atrial fibrillation, CRI, CAD s/p CABG 2001,and prior bradycardia who is admitted with symptomatic complete heart block. EF 35-40% chronically. She has recently been followed by Richardson Dopp for worsening CHF.   She presented 05/10/19 to [facility] with intermittent syncope and was found to have complete heart block with prolonged RR Intervals.  She was urgently transferred to [facility] and was noted to be in extremis with lactic acidosis and confusion.  She required urgent temporary pacing.  She has been maintained on dopamine.   Past Medical History:  Diagnosis Date  . Cataract    OU  . Chronic combined systolic and diastolic CHF (congestive heart failure) ([place])   . Cirrhosis of liver ([place])   . CKD (chronic kidney disease), stage III ([place])   . Coronary artery disease    a. s/p CABG 2001 w/ (LIMA-OM, SVG-D1, SVG-RCA). b. h/o multiple PCIs per Dr. Antionette Char note.  . Depression   . Esophageal varices ([place])    New 2013  . Gastroesophageal reflux disease   . History of pneumonia   . Hyperlipidemia   . Hypertension   . Hypertensive retinopathy    OU  . Obesity   . Osteoarthritis   . Pancytopenia ([place])   . Paroxysmal atrial flutter ([place])    a. dx 05/2016.  [name] Persistent atrial fibrillation ([place])    a. reported in hosp 07/2016, not on anticoag due to cirrhosis and liver disease, low platelets, varices.  . Thrombocytopenia ([place])    Past Surgical History:  Procedure Laterality Date  . ABDOMINAL HYSTERECTOMY    . BACK SURGERY    . CARDIAC CATHETERIZATION  2004   left internal mammary artery to the obtuse marginal  was found to be small and thread like.  The two grafts were patent.  The left circumflex had 90% in-stent restenosis and cutting  balloon angioplasty was performed followed by placement of a 3.0 x 58m Taxus drug -eluting stent.    .Marland KitchenCARDIAC CATHETERIZATION  2006   There was in-stent restenosis in the left circumflex and this was treated with cutting balloon angioplasty   . CARDIAC CATHETERIZATION  2008   vein graft to the to the obtuse marginal was patent, although small, left circumflex had 40% in-stent restenosis, ejection fraction 40-45%.  The patient was medically mananged.  .Marland KitchenCARDIAC CATHETERIZATION N/A 01/09/2015   Procedure: Left Heart Cath and Cors/Grafts Angiography;  Surgeon: HBelva Crome MD;  [facility];  Service: Cardiovascular;  Laterality: N/A;  . CATARACT EXTRACTION W/PHACO Right 05/17/2019   Procedure: CATARACT EXTRACTION PHACO AND INTRAOCULAR LENS PLACEMENT (IOC) (CDE: 14.99);  Surgeon: WBaruch Goldmann MD;  Location: [facility];  Service: Ophthalmology;  Laterality: Right;  . CATARACT EXTRACTION W/PHACO Left 06/10/2019   Procedure: CATARACT EXTRACTION PHACO AND INTRAOCULAR LENS PLACEMENT (IOC);  Surgeon: WBaruch Goldmann MD;  Location: [facility];  Service: Ophthalmology;  Laterality: Left;  CDE: 11.96  . cataract sx    . COLONOSCOPY  03/29/2011   Procedure: COLONOSCOPY;  Surgeon: MJamesetta So MD;  Location: [facility];  Service: Gastroenterology;  Laterality: N/A;  . CORONARY ARTERY BYPASS GRAFT  May 31,2001   x 3 with a vein graft to the first diagonal, vein graft to the right coronary  artery, and a free left internal mammary  artery to the obtuse marginal   . ESOPHAGEAL BANDING N/A 07/04/2012   Procedure: ESOPHAGEAL BANDING;  Surgeon: Rogene Houston, MD;  Location: [facility];  Service: Endoscopy;  Laterality: N/A;  . ESOPHAGEAL BANDING N/A 09/17/2012   Procedure: ESOPHAGEAL BANDING;  Surgeon: Rogene Houston, MD;  [facility];  Service: Endoscopy;  Laterality: N/A;  . ESOPHAGEAL BANDING N/A 10/22/2013   Procedure: ESOPHAGEAL BANDING;  Surgeon: Rogene Houston, MD;  [facility]  [facility];  Service: Endoscopy;  Laterality: N/A;  . ESOPHAGEAL BANDING N/A 11/28/2014   Procedure: ESOPHAGEAL BANDING;  Surgeon: Rogene Houston, MD;  [facility];  Service: Endoscopy;  Laterality: N/A;  . ESOPHAGEAL BANDING N/A 10/29/2015   Procedure: ESOPHAGEAL BANDING;  Surgeon: Rogene Houston, MD;  [facility];  Service: Endoscopy;  Laterality: N/A;  . ESOPHAGOGASTRODUODENOSCOPY  12/20/2011   Procedure: ESOPHAGOGASTRODUODENOSCOPY (EGD);  Surgeon: Jamesetta So, MD;  [facility];  Service: Gastroenterology;  Laterality: N/A;  . ESOPHAGOGASTRODUODENOSCOPY N/A 07/04/2012   Procedure: ESOPHAGOGASTRODUODENOSCOPY (EGD);  Surgeon: Rogene Houston, MD;  [facility];  Service: Endoscopy;  Laterality: N/A;  235-moved to 255 Ann to notify pt  . ESOPHAGOGASTRODUODENOSCOPY N/A 09/17/2012   Procedure: ESOPHAGOGASTRODUODENOSCOPY (EGD);  Surgeon: Rogene Houston, MD;  [facility];  Service: Endoscopy;  Laterality: N/A;  730  . ESOPHAGOGASTRODUODENOSCOPY N/A 10/22/2013   Procedure: ESOPHAGOGASTRODUODENOSCOPY (EGD);  Surgeon: Rogene Houston, MD;  [facility];  Service: Endoscopy;  Laterality: N/A;  730  . ESOPHAGOGASTRODUODENOSCOPY N/A 11/28/2014   Procedure: ESOPHAGOGASTRODUODENOSCOPY (EGD);  Surgeon: Rogene Houston, MD;  [facility];  Service: Endoscopy;  Laterality: N/A;  1:25  . ESOPHAGOGASTRODUODENOSCOPY N/A 10/29/2015   Procedure: ESOPHAGOGASTRODUODENOSCOPY (EGD);  Surgeon: Rogene Houston, MD;  [facility];  Service: Endoscopy;  Laterality: N/A;  12:00  . ESOPHAGOGASTRODUODENOSCOPY N/A 12/12/2016   Grade 1 and 2 varices in lower third of esophagus, post variceal banding scar distal esophagus, mild portal hypertensive gastropathy, Type 1 gastroesophageal varices without bleeding, normal duodenum and second portion of duodenum.   [name] FLEXIBLE SIGMOIDOSCOPY N/A 03/20/2017   external hemorrhoids  . JOINT  REPLACEMENT    . LEFT HEART CATH AND CORS/GRAFTS ANGIOGRAPHY N/A 08/15/2016   Procedure: Left Heart Cath and Cors/Grafts Angiography;  Surgeon: Jettie Booze, MD;  Location: [place] [facility];  Service: Cardiovascular;  Laterality: N/A;  . LEFT HEART CATHETERIZATION WITH CORONARY/GRAFT ANGIOGRAM N/A 12/25/2013   Procedure: LEFT HEART CATHETERIZATION WITH Beatrix Fetters;  Surgeon: Blane Ohara, MD;  Location: [facility] C[facility] LAB;  Service: Cardiovascular;  Laterality: N/A;  . RIGHT HEART CATH N/A 05/29/2017   Procedure: RIGHT HEART CATH;  Surgeon: Jolaine Artist, MD;  Location: [place] [facility];  Service: Cardiovascular;  Laterality: N/A;  . rotator cuff left  2007  . TONSILLECTOMY      . Chlorhexidine Gluconate Cloth  6 each Topical Daily  . ezetimibe  10 mg Oral QHS  . Gerhardt's butt cream   Topical TID  . heparin  5,000 Units Subcutaneous Q8H  . lidocaine  1 patch Transdermal Q24H  . methocarbamol  500 mg Oral TID  . pantoprazole  40 mg Oral Daily  . senna-docusate  2 tablet Oral QHS  . sodium chloride flush  10-40 mL Intracatheter Q12H  . sodium chloride flush  3 mL Intravenous Q12H  . sodium chloride  flush  3 mL Intravenous Q12H   . sodium chloride    . sodium chloride    . DOPamine 10 mcg/kg/min (05/10/20 0900)    Allergies  Allergen Reactions  . Entresto [Sacubitril-Valsartan] Swelling    Swelling of eyes  . Vibra-Tab [Doxycycline] Nausea Only  . Acetaminophen Other (See Comments)    Liver problems  . Ace Inhibitors Other (See Comments)    UNSURE OF REACTION TYPE   . Cefaclor Itching and Rash    Pt tolerates meropenem  . Cephalexin Itching and Rash    Pt tolerates meropenem  . Penicillins Rash    Has patient had a PCN reaction causing immediate rash, facial/tongue/throat swelling, SOB or lightheadedness with hypotension: YES Has patient had a PCN reaction causing severe rash involving mucus membranes or skin necrosis: NO Has patient had a PCN  reaction that required hospitalization: YES Has patient had a PCN reaction occurring within the last 10 years: NO If all of the above answers are "NO", then may proceed with Cephalosporin use. Pt tolerates meropenem 02/19/20   . Pregabalin Other (See Comments)    Retains fluid  . Tape Itching and Rash    Takes skin right off with medical tape--PAPER TAPE ONLY    Social History   Socioeconomic History  . Marital status: Divorced    Spouse name: Not on file  . Number of children: Not on file  . Years of education: Not on file  . Highest education level: Not on file  Occupational History  . Occupation: Disabled  Tobacco Use  . Smoking status: Former Smoker    Packs/day: 0.50    Years: 20.00    Pack years: 10.00    Types: Cigarettes    Quit date: 07/21/1995    Years since quitting: 24.8  . Smokeless tobacco: Never Used  Vaping Use  . Vaping Use: Never used  Substance and Sexual Activity  . Alcohol use: No  . Drug use: No  . Sexual activity: Not on file  Other Topics Concern  . Not on file  Social History Narrative   Lives in [place] by herself.     Social Determinants of Health   Financial Resource Strain: Not on file  Food Insecurity: Not on file  Transportation Needs: Not on file  Physical Activity: Not on file  Stress: Not on file  Social Connections: Not on file  Intimate Partner Violence: Not on file    Family History  Problem Relation Age of Onset  . Heart attack Mother   . Diabetes Mother   . Heart attack Father 84       cause of death  . Diabetes Father   . Coronary artery disease Sister        CABG   . Diabetes Sister   . Glaucoma Sister   . Diabetes Brother   . Colon cancer Neg Hx     ROS- confused and unable to provide  Physical Exam: Telemetry:  V paced, underlying is CHB Vitals:   05/10/20 0815 05/10/20 0830 05/10/20 0845 05/10/20 0900  BP: (!) 116/48 (!) 112/45 (!) 114/45 (!) 108/50  Pulse: 70 69 69 69  Resp: 15 13 12 10   Temp:       TempSrc:      SpO2: 98% 99% 98% 99%  Weight:      Height:        GEN- The patient is chronically ill appearing, alert but confused Head- normocephalic, atraumatic Eyes-  Sclera icteric, conjunctiva  pink Ears- hearing intact Oropharynx- clear Neck- R neck CVL Lungs-   normal work of breathing Heart- Regular rate and rhythm (paced) GI- soft  Extremities- no clubbing, cyanosis, +edema  EKG-  Labs:   Lab Results  Component Value Date   WBC 9.4 05/09/2020   HGB 12.6 05/10/2020   HCT 37.0 05/10/2020   MCV 94.4 05/09/2020   PLT 89 (L) 05/09/2020    Recent Labs  Lab 05/09/20 2039 05/09/20 2041 05/10/20 0607  NA 134*   < > 140  K 4.6   < > 4.9  CL 98  --  103  CO2 24  --  26  BUN 36*  --  37*  CREATININE 3.01*  --  2.79*  CALCIUM 9.3  --  9.3  PROT 7.1  --   --   BILITOT 9.3*  --   --   ALKPHOS 136*  --   --   ALT 24  --   --   AST 54*  --   --   GLUCOSE 161*  --  111*   < > = values in this interval not displayed.   Lab Results  Component Value Date   CKTOTAL 34 04/26/2008   CKMB 0.7 04/26/2008   TROPONINI <0.03 04/12/2018    Lab Results  Component Value Date   CHOL 111 05/26/2017   CHOL 143 08/15/2016   CHOL 135 01/09/2015   Lab Results  Component Value Date   HDL 38 (L) 05/26/2017   HDL 65 08/15/2016   HDL 37 (L) 01/09/2015   Lab Results  Component Value Date   LDLCALC 54 05/26/2017   LDLCALC 68 08/15/2016   LDLCALC 74 01/09/2015   Lab Results  Component Value Date   TRIG 93 05/26/2017   TRIG 50 08/15/2016   TRIG 119 01/09/2015   Lab Results  Component Value Date   CHOLHDL 2.9 05/26/2017   CHOLHDL 2.2 08/15/2016   CHOLHDL 3.6 01/09/2015   No results found for: LDLDIRECT    ASSESSMENT AND PLAN:   1. Complete heart block The patient presents quite ill with lactic acidosis, confusion, and renal failure secondary to prolonged complete heart block. She is slowly improving s/p temporary pacing.  Her pacing threshold is 0.2 mA.  I have  her programmed at 10 mA with a robust safety margin currently.  She will need pacing once she is clinically more stable.  Hopefully this can be performed tomorrow.  Risks, benefits, alternatives to pacemaker implantation were discussed in detail with the patient today. The patient understands that the risks include but are not limited to bleeding, infection, pneumothorax, perforation, tamponade, vascular damage, renal failure, MI, stroke, death,  and lead dislodgement and wishes to proceed once we are able.  Given low plts her bleeding risks are very high.  She is also at risk with renal failure.  Given CHF, she may benefit from either biventricular pacing or conduction system pacing. Continue temporary pacing today.  Wean dopamin to off as able  2. Cardiogenic shock Hopefully will improve with pacing Wean dopamine as able General cardiology to follow closely today  3. CAD s/p CABG and PCI Stable No change required today  4. afib Not a candidate for [place] due to cirrhosis and low plts.   Thompson Grayer, MD 05/10/2020  9:05 AM

## 2020-05-10 NOTE — Progress Notes (Addendum)
INR=2.1, Vit K PO x1, heparin subQ discontinued.  CT chest reviewed. Initial impression is still interstitial fluid seems more on the peripheral, no clear bronchoarogram. Overall still consider CHF>PNA. Waiting for formal radiology report.

## 2020-05-10 NOTE — Progress Notes (Addendum)
Progress Note  Patient Name: Samantha [name] Date of Encounter: 05/10/2020  [facility] Cardiologist: Sherren Mocha, MD   Subjective   Feeling better this morning. Had some shortness of breath earlier but this resolved. Chest pain has resolved.   Lactate down-trended to 2.4 this AM (from 6.9) Remains V-paced at 70 Blood pressures 110s On dopamine 63mg (for hypotension) Cr 3.01-->2.79 UOP 400 recorded overnight  Inpatient Medications    Scheduled Meds:  Chlorhexidine Gluconate Cloth  6 each Topical Daily   ezetimibe  10 mg Oral QHS   Gerhardt's butt cream   Topical TID   heparin  5,000 Units Subcutaneous Q8H   lidocaine  1 patch Transdermal Q24H   methocarbamol  500 mg Oral TID   pantoprazole  40 mg Oral Daily   senna-docusate  2 tablet Oral QHS   sodium chloride flush  10-40 mL Intracatheter Q12H   sodium chloride flush  3 mL Intravenous Q12H   sodium chloride flush  3 mL Intravenous Q12H   Continuous Infusions:  sodium chloride     sodium chloride     DOPamine 10 mcg/kg/min (05/10/20 0700)   PRN Meds: sodium chloride, sodium chloride, acetaminophen, albuterol, ipratropium, oxyCODONE, sodium chloride flush, sodium chloride flush, sodium chloride flush   Vital Signs    Vitals:   05/10/20 0630 05/10/20 0645 05/10/20 0700 05/10/20 0715  BP: (!) 105/37 (!) 77/43 (!) 105/50 111/76  Pulse: 70 70 68 69  Resp: (!) 30 11 17 10   Temp:      TempSrc:      SpO2: 99% 97% 95% 96%  Weight:      Height:        Intake/Output Summary (Last 24 hours) at 05/10/2020 0746 Last data filed at 05/10/2020 0700 Gross per 24 hour  Intake 939.66 ml  Output 400 ml  Net 539.66 ml   Last 3 Weights 05/10/2020 05/09/2020 05/09/2020  Weight (lbs) 168 lb 14 oz 164 lb 14.5 oz 163 lb  Weight (kg) 76.6 kg 74.8 kg 73.936 kg      Telemetry    V-paced at 764- Personally Reviewed  ECG    No new tracing - Personally Reviewed  Physical Exam   GEN: Chronically ill appearing, slightly  jaundiced, comfortable and NAD Neck: TVP in place Cardiac: RR, 2/6 systolic murmur.  Respiratory: Diminished with faint crackles GI: Soft, nontender, non-distended  MS: Trace pedal edema; warm Neuro:  Nonfocal  Psych: Normal affect   Labs    High Sensitivity Troponin:   Recent Labs  Lab 04/12/20 1411 04/12/20 1527 04/12/20 2118 05/09/20 1307 05/09/20 1506  TROPONINIHS 27* 28* 29* 81* 86*      Chemistry Recent Labs  Lab 05/09/20 1307 05/09/20 2039 05/09/20 2041 05/10/20 0102 05/10/20 0607  NA 139 134* 140 140 140  K 4.3 4.6 4.7 4.7 4.9  CL 100 98  --   --  103  CO2 23 24  --   --  26  GLUCOSE 111* 161*  --   --  111*  BUN 36* 36*  --   --  37*  CREATININE 2.97* 3.01*  --   --  2.79*  CALCIUM 9.1 9.3  --   --  9.3  PROT 6.7 7.1  --   --   --   ALBUMIN 2.6* 2.7*  --   --   --   AST 52* 54*  --   --   --   ALT 24 24  --   --   --  ALKPHOS 136* 136*  --   --   --   BILITOT 7.3* 9.3*  --   --   --   GFRNONAA 16* 16*  --   --  18*  ANIONGAP 16* 12  --   --  11     Hematology Recent Labs  Lab 05/09/20 1307 05/09/20 2039 05/09/20 2041 05/10/20 0102  WBC 5.5 9.4  --   --   RBC 3.83* 3.94  --   --   HGB 11.7* 12.0 12.9 12.6  HCT 37.0 37.2 38.0 37.0  MCV 96.6 94.4  --   --   MCH 30.5 30.5  --   --   MCHC 31.6 32.3  --   --   RDW 18.4* 18.0*  --   --   PLT 67* 89*  --   --     BNP Recent Labs  Lab 05/09/20 1307  BNP 488.0*     DDimer No results for input(s): DDIMER in the last 168 hours.   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  Upon insertion of the sheath, the patient went into AI VR with rates of the 110to 120s -> at that point I decided to forego right heart catheterization and just place the temporary pacemaker. => Temporary pacer used to  overdrive pace out of AI VR. 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 RECOMMENDATIONS  Return to nursing unit, continue on dopamine wean as tolerated for blood pressures.  Patient is now essentially pacemaker dependent-would need backup transcutaneous pacing in place if TPM becomes unstable We will need formal EP evaluation to determine timing of PPM placement. Glenetta Hew, MD   ECHOCARDIOGRAM COMPLETE  Result Date: 05/09/2020    ECHOCARDIOGRAM REPORT   Patient Name:   Samantha [name] Date of Exam: 05/09/2020 Medical Rec #:  [id number]      Height:       62.0 in Accession #:    [id number]     Weight:       163.0 lb Date of Birth:  Jan 26, 1951      BSA:          1.753 m Patient Age:    12 years       BP:           105/42 mmHg Patient Gender: F              HR:           46 bpm. Exam Location:  Inpatient Procedure: 2D Echo, Cardiac Doppler, Color Doppler and Intracardiac            Opacification Agent STAT ECHO Indications:    Complete heart block  History:        Patient has prior history of Echocardiogram examinations, most                 recent 01/24/2020. CHF, CAD, Prior CABG, PAD and COPD,                 Arrythmias:Atrial Flutter; Risk Factors:Diabetes, Hypertension                 and Dyslipidemia.  Sonographer:    Dustin Flock Referring Phys: [id number] [place]  1. Left ventricular ejection fraction, by estimation, is 35 to 40%. The  left ventricle has moderately decreased function. The left ventricle demonstrates global hypokinesis and RWMA as noted in findings. There is mild left ventricular hypertrophy. Left ventricular diastolic parameters are indeterminate.  2. Respiratory related septal shift.  3. Right ventricular systolic function is mildly reduced. The right ventricular size is normal. There is moderately elevated pulmonary artery systolic pressure. The estimated right ventricular systolic pressure is 03.2 mmHg.  4. Left atrial size was moderately dilated.  5. Right  atrial size was moderately dilated.  6. Diastolic and systolic mitral valve regurgitation.. The mitral valve is grossly normal. Mild to moderate mitral valve regurgitation.  7. Tricuspid valve regurgitation is moderate.  8. The aortic valve is grossly normal. There is mild calcification of the aortic valve. Aortic valve regurgitation is not visualized. No aortic stenosis is present.  9. The inferior vena cava is dilated in size with <50% respiratory variability, suggesting right atrial pressure of 15 mmHg. FINDINGS  Left Ventricle: Left ventricular ejection fraction, by estimation, is 35 to 40%. The left ventricle has moderately decreased function. The left ventricle demonstrates global hypokinesis. Definity contrast agent was given IV to delineate the left ventricular endocardial borders. The left ventricular internal cavity size was normal in size. There is mild left ventricular hypertrophy. Abnormal (paradoxical) septal motion, consistent with left bundle branch block. Left ventricular diastolic parameters are indeterminate.  LV Wall Scoring: The inferior wall, posterior wall, and mid inferoseptal segment are akinetic. The mid anteroseptal segment is hypokinetic. Right Ventricle: The right ventricular size is normal. No increase in right ventricular wall thickness. Right ventricular systolic function is mildly reduced. There is moderately elevated pulmonary artery systolic pressure. The tricuspid regurgitant velocity is 3.08 m/s, and with an assumed right atrial pressure of 15 mmHg, the estimated right ventricular systolic pressure is 12.2 mmHg. Left Atrium: Left atrial size was moderately dilated. Right Atrium: Right atrial size was moderately dilated. Pericardium: There is no evidence of pericardial effusion. Mitral Valve: Diastolic and systolic mitral valve regurgitation. The mitral valve is grossly normal. Mild to moderate mitral valve regurgitation. MV peak gradient, 16.6 mmHg. The mean mitral valve gradient  is 3.0 mmHg with average heart rate of 47 bpm. Tricuspid Valve: The tricuspid valve is normal in structure. Tricuspid valve regurgitation is moderate . No evidence of tricuspid stenosis. Aortic Valve: The aortic valve is grossly normal. There is mild calcification of the aortic valve. Aortic valve regurgitation is not visualized. No aortic stenosis is present. Aortic valve mean gradient measures 8.0 mmHg. Aortic valve peak gradient measures 19.0 mmHg. Aortic valve area, by VTI measures 3.12 cm. Pulmonic Valve: The pulmonic valve was normal in structure. Pulmonic valve regurgitation is trivial. Aorta: The aortic root is normal in size and structure. Venous: The inferior vena cava is dilated in size with less than 50% respiratory variability, suggesting right atrial pressure of 15 mmHg. IAS/Shunts: No atrial level shunt detected by color flow Doppler.  LEFT VENTRICLE PLAX 2D LVIDd:         5.20 cm  Diastology LVIDs:         4.70 cm  LV e' medial:    6.09 cm/s LV PW:         1.20 cm  LV E/e' medial:  26.3 LV IVS:        1.20 cm  LV e' lateral:   9.68 cm/s LVOT diam:     2.30 cm  LV E/e' lateral: 16.5 LV SV:         133 LV SV  Index:   76 LVOT Area:     4.15 cm  RIGHT VENTRICLE RV Basal diam:  3.30 cm RV S prime:     9.79 cm/s TAPSE (M-mode): 2.1 cm LEFT ATRIUM             Index       RIGHT ATRIUM           Index LA diam:        4.90 cm 2.80 cm/m  RA Area:     22.90 cm LA Vol (A2C):   70.9 ml 40.46 ml/m RA Volume:   75.40 ml  43.02 ml/m LA Vol (A4C):   69.3 ml 39.54 ml/m LA Biplane Vol: 73.7 ml 42.05 ml/m  AORTIC VALVE                    PULMONIC VALVE AV Area (Vmax):    2.65 cm     PV Vmax:       1.86 m/s AV Area (Vmean):   2.78 cm     PV Peak grad:  13.8 mmHg AV Area (VTI):     3.12 cm AV Vmax:           218.00 cm/s AV Vmean:          131.000 cm/s AV VTI:            0.425 m AV Peak Grad:      19.0 mmHg AV Mean Grad:      8.0 mmHg LVOT Vmax:         139.00 cm/s LVOT Vmean:        87.700 cm/s LVOT VTI:           0.319 m LVOT/AV VTI ratio: 0.75  AORTA Ao Root diam: 2.90 cm MITRAL VALVE                TRICUSPID VALVE MV Area (PHT): 7.16 cm     TR Peak grad:   37.9 mmHg MV Area VTI:   2.12 cm     TR Vmax:        308.00 cm/s MV Peak grad:  16.6 mmHg MV Mean grad:  3.0 mmHg     SHUNTS MV Vmax:       2.04 m/s     Systemic VTI:  0.32 m MV Vmean:      91.0 cm/s    Systemic Diam: 2.30 cm MV Decel Time: 106 msec MV E velocity: 160.00 cm/s MV A velocity: 131.00 cm/s MV E/A ratio:  1.22 Cherlynn Kaiser MD Electronically signed by Cherlynn Kaiser MD Signature Date/Time: 05/09/2020/5:23:45 PM    Final     Cardiac Studies   Echo 01/24/20: 1. Left ventricular ejection fraction, by estimation, is 35 to 40%. The  left ventricle has moderately decreased function. The left ventricle  demonstrates global hypokinesis. Left ventricular diastolic function could  not be evaluated. There is severe  hypokinesis of the left ventricular, entire inferior wall and  inferolateral wall.   2. Right ventricular systolic function is mildly reduced. The right  ventricular size is normal. There is mildly elevated pulmonary artery  systolic pressure.   3. Left atrial size was severely dilated.   4. Right atrial size was severely dilated.   5. The mitral valve is normal in structure. Mild to moderate mitral valve  regurgitation.   6. Tricuspid valve regurgitation is moderate.   7. The aortic valve is tricuspid. Aortic valve regurgitation is not  visualized. Mild aortic valve  sclerosis is present, with no evidence of  aortic valve stenosis.   8. The inferior vena cava is normal in size with <50% respiratory  variability, suggesting right atrial pressure of 8 mmHg.   Patient Profile     70 y.o. female with history of multivessel CAD status post CABG in 2001 with subsequent PCIs, nonalcoholic cirrhosis with esophageal varices and thrombocytopenia, heart failure with reduced ejection fraction as low as 20 to 35%, HTN, HLD, Afib not on AC  due to cirrhosis and varices, SSS, COPD on home O2 who presented to [facility] with an episode of unresponsiveness found to be profoundly bradycardic in the field initially responsive to atropine. On arrival to [facility], reportedly had episode of accelerated idioventricular rhythm-->10s pause in which CPR was almost initiated and then she developed a bradycardic rhythm consistent with CHB. Subsequently transferred to [facility] now s/p TVP placement.  Assessment & Plan    #Complete Heart Block: #Accelerated Idioventricular Rhythm: Patient presented to [facility] with 2 week history of worsening fatigue and weakness found to be in complete heart block. Course complicated by suspected accelerated idioventricular rhythm followed by 10s pause. Recovered prior to initiation of CPR. Post -pause rhythm with CHB prompting transfer to [facility]. Long discussion held with patient, EP and interventional cardiology about patient candidacy for PPM placement. Notably, the patient was previously DNR but decided she "was not ready to die" and wanted to pursue all necessary treatment as able. While she is high risk given comorbidities, the patient was deemed an appropriate candidate for PPM placement and underwent emergent TVP yesterday as bridge to PPM on Monday. -EP consulted, appreciate recommendations -S/p TVP placement with appropriate capture -Obtain CXR this AM to assess position -Plan for PPM placement -Wean dopamine as tolerated  #Cardiogenic Shock: Secondary to complete heart block as above. Lactate improving overnight following pacing. Weaning dopamine. -Manage complete heart block as above -Trend lactate -Wean dopamine as tolerated -TTE with stable LVEF 69-67%, diastolic and systolic MR and TR (which will likely improve with pacing) -Consider diuresis later today  #Multivessel CAD s/p CABG in 2001 with subsequent PCI: Patient with know multivessel, severe CAD. Likely acute presentation due to CHB with  lower suspcion of ACS. Coronary angiography deferred given significantly elevated Cr. May consider in the future, however, patient is a high risk DAPT candidate given thrombocytopenia and cirrhosis.  -GDMT limited due to cirrhosis and thrombocytopenia  -Continue zetia 71m daily -Manage complete heart block as above -Coronary angiography deferred yesterday due to AKI and patient high risk for DAPT--can reassess once more clinically stable  #Chronic Systolic Heart Failure: LVEF 35-40% on this admission which is stable from prior. While filling pressures elevated, likely secondary to low CO due to CHB. Now improving s/p TVP. -Monitor I/Os off diuresis now that CO improved with pacing -Likely will need to resume diuresis later today or tomorrow as patient volume up on examination -Unable to tolerate GDMT due to hypotension/CHB/AKI -Monitor I/Os and daily weights -Low Na diet  #AKI on CKD Stage 4: Likely secondary to shock as above. Cr improved to 2.79 this AM -Manage CHB and shock as above -Holding diuresis for now given soft blood pressures and recent cardiogenic shock in the setting of low out-put due to CHB; will likely need to resume later today or tomorrow as patient volume up on examination  #Non-alcoholic cirrhosis: Complicated by varices and thrombocytopenia. -Trend plt -No AC or DAPT at this time  #Paroxysmal Afib: [facility]  to high risk of bleed with cirrhosis and thrombocytopenia. -No AC -Holding nodal agents due to CHB as above  #COPD on home O2: -Continue supplemental O2 -Inhalers prn  CRITICAL CARE TIME: I have spent a total of 45 minutes with patient reviewing hospital notes, telemetry, EKGs, labs and examining the patient as well as establishing an assessment and plan that was discussed with the patient.  > 50% of time was spent in direct patient care. The patient is critically ill with multi-organ system failure and requires high complexity decision making for  assessment and support, frequent evaluation and titration of therapies, application of advanced monitoring technologies and extensive interpretation of multiple databases.  For questions or updates, please contact [place] Please consult [email or web address] for contact info under        Signed, Freada Bergeron, MD  05/10/2020, 7:46 AM

## 2020-05-10 NOTE — Plan of Care (Signed)

## 2020-05-10 NOTE — Progress Notes (Signed)
Paged regarding CT with TVP in PA outflow, post TVP CXR with similar (reported as RA but PA then as well given she was capturing while in CHB). Overdrive paced at 374, checked threshold, currently 0.4 mA. Currently NSR 80s. Changed TVP to backup 50 to see if she needs it overnight or not. Output decreased from 10 mA to 5 mA. Vsens left at 2.0 mV. Do not recommend repositioning of wire since she has been in CHB on admission and has good capture/threshhold currently.

## 2020-05-10 NOTE — Progress Notes (Signed)
Overnight patient intermittently confused but no FNDs. Still on DA 20 mcg following TVP placement. BP moderately hypotensive (MAP 60-65) while on DA. Trended lactates overnight and continued to down trend. Mental status back to baseline and A&O x4. Warm and well perfused. Bedside TTE without pericardial effusion. TVP VVI 70 with good capture. Will wean DA for sBP goal >90 (OP BP always 90/50s), checking cuff pressure on R arm. If issues weaning off DA or lactate starts to uptrend then will place A line for more accurate monitoring and titration. She should not be requiring ongoing DA support following TVP placement if CHB and transient ventricular escape in the 30s was the main cause of her lactic acidosis. Will wean DA down over the next 2 hours and recheck labs at 0600.

## 2020-05-11 ENCOUNTER — Inpatient Hospital Stay (HOSPITAL_COMMUNITY): Payer: Medicare Other

## 2020-05-11 ENCOUNTER — Encounter (HOSPITAL_COMMUNITY)
Admission: EM | Disposition: A | Discharge status: Hospice | Payer: Self-pay | Source: Home / Self Care | Attending: Cardiology

## 2020-05-11 ENCOUNTER — Encounter (HOSPITAL_COMMUNITY): Payer: Self-pay | Admitting: Cardiology

## 2020-05-11 DIAGNOSIS — N179 Acute kidney failure, unspecified: Secondary | ICD-10-CM

## 2020-05-11 DIAGNOSIS — I5043 Acute on chronic combined systolic (congestive) and diastolic (congestive) heart failure: Secondary | ICD-10-CM | POA: Diagnosis not present

## 2020-05-11 DIAGNOSIS — R001 Bradycardia, unspecified: Secondary | ICD-10-CM

## 2020-05-11 DIAGNOSIS — N1831 Chronic kidney disease, stage 3a: Secondary | ICD-10-CM

## 2020-05-11 DIAGNOSIS — R570 Cardiogenic shock: Secondary | ICD-10-CM

## 2020-05-11 DIAGNOSIS — K7460 Unspecified cirrhosis of liver: Secondary | ICD-10-CM | POA: Diagnosis not present

## 2020-05-11 DIAGNOSIS — Z951 Presence of aortocoronary bypass graft: Secondary | ICD-10-CM

## 2020-05-11 DIAGNOSIS — I25119 Atherosclerotic heart disease of native coronary artery with unspecified angina pectoris: Secondary | ICD-10-CM | POA: Diagnosis not present

## 2020-05-11 DIAGNOSIS — I442 Atrioventricular block, complete: Secondary | ICD-10-CM

## 2020-05-11 HISTORY — PX: PACEMAKER IMPLANT: EP1218

## 2020-05-11 LAB — BASIC METABOLIC PANEL
Anion gap: 10 (ref 5–15)
BUN: 37 mg/dL — ABNORMAL HIGH (ref 8–23)
CO2: 27 mmol/L (ref 22–32)
Calcium: 9.3 mg/dL (ref 8.9–10.3)
Chloride: 99 mmol/L (ref 98–111)
Creatinine, Ser: 2.3 mg/dL — ABNORMAL HIGH (ref 0.44–1.00)
GFR, Estimated: 22 mL/min — ABNORMAL LOW (ref >60)
Glucose, Bld: 106 mg/dL — ABNORMAL HIGH (ref 70–99)
Potassium: 4.4 mmol/L (ref 3.5–5.1)
Sodium: 136 mmol/L (ref 135–145)

## 2020-05-11 LAB — CBC
HCT: 32.7 % — ABNORMAL LOW (ref 36.0–46.0)
Hemoglobin: 10.5 g/dL — ABNORMAL LOW (ref 12.0–15.0)
MCH: 30.3 pg (ref 26.0–34.0)
MCHC: 32.1 g/dL (ref 30.0–36.0)
MCV: 94.5 fL (ref 80.0–100.0)
Platelets: 50 10*3/uL — ABNORMAL LOW (ref 150–400)
RBC: 3.46 MIL/uL — ABNORMAL LOW (ref 3.87–5.11)
RDW: 17.5 % — ABNORMAL HIGH (ref 11.5–15.5)
WBC: 5.2 10*3/uL (ref 4.0–10.5)
nRBC: 0 % (ref 0.0–0.2)

## 2020-05-11 LAB — ABO/RH: ABO/RH(D): O POS

## 2020-05-11 LAB — PROTIME-INR
INR: 2.2 — ABNORMAL HIGH (ref 0.8–1.2)
Prothrombin Time: 23.6 seconds — ABNORMAL HIGH (ref 11.4–15.2)

## 2020-05-11 LAB — PROCALCITONIN: Procalcitonin: <0.1 ng/mL

## 2020-05-11 SURGERY — PACEMAKER IMPLANT

## 2020-05-11 MED ORDER — SODIUM CHLORIDE 0.9 % IV SOLN
INTRAVENOUS | Status: AC
  Filled 2020-05-11: qty 2

## 2020-05-11 MED ORDER — LIDOCAINE HCL (PF) 1 % IJ SOLN
INTRAMUSCULAR | Status: DC | PRN
  Administered 2020-05-11: 45 mL

## 2020-05-11 MED ORDER — HYDROMORPHONE HCL 1 MG/ML IJ SOLN
0.5000 mg | Freq: Once | INTRAMUSCULAR | Status: AC
  Administered 2020-05-11: 0.5 mg via INTRAVENOUS
  Filled 2020-05-11: qty 0.5

## 2020-05-11 MED ORDER — CHLORHEXIDINE GLUCONATE 4 % EX LIQD
60.0000 mL | Freq: Once | CUTANEOUS | Status: AC
  Administered 2020-05-11: 4 via TOPICAL
  Filled 2020-05-11: qty 15

## 2020-05-11 MED ORDER — SODIUM CHLORIDE 0.9 % IV SOLN: INTRAVENOUS | Status: DC

## 2020-05-11 MED ORDER — HEPARIN (PORCINE) IN NACL 1000-0.9 UT/500ML-% IV SOLN
INTRAVENOUS | Status: DC | PRN
  Administered 2020-05-11: 500 mL

## 2020-05-11 MED ORDER — SODIUM CHLORIDE 0.9 % IV SOLN
80.0000 mg | INTRAVENOUS | Status: AC
  Administered 2020-05-11: 80 mg
  Filled 2020-05-11: qty 2

## 2020-05-11 MED ORDER — LIDOCAINE HCL 1 % IJ SOLN
INTRAMUSCULAR | Status: AC
  Filled 2020-05-11: qty 60

## 2020-05-11 MED ORDER — SODIUM CHLORIDE 0.45 % IV SOLN: INTRAVENOUS | Status: DC

## 2020-05-11 MED ORDER — FENTANYL CITRATE (PF) 100 MCG/2ML IJ SOLN
INTRAMUSCULAR | Status: AC
  Filled 2020-05-11: qty 2

## 2020-05-11 MED ORDER — SODIUM CHLORIDE 0.9 % IV SOLN
3.0000 g | Freq: Two times a day (BID) | INTRAVENOUS | Status: DC
  Administered 2020-05-11 – 2020-05-14 (×8): 3 g via INTRAVENOUS
  Filled 2020-05-11: qty 8
  Filled 2020-05-11: qty 3
  Filled 2020-05-11 (×5): qty 8
  Filled 2020-05-11: qty 3
  Filled 2020-05-11: qty 8

## 2020-05-11 MED ORDER — VANCOMYCIN HCL IN DEXTROSE 1-5 GM/200ML-% IV SOLN
INTRAVENOUS | Status: AC
  Filled 2020-05-11: qty 200

## 2020-05-11 MED ORDER — VANCOMYCIN HCL IN DEXTROSE 1-5 GM/200ML-% IV SOLN
1000.0000 mg | INTRAVENOUS | Status: AC
  Administered 2020-05-11: 1000 mg via INTRAVENOUS

## 2020-05-11 MED ORDER — CEFAZOLIN SODIUM-DEXTROSE 2-4 GM/100ML-% IV SOLN
INTRAVENOUS | Status: AC
  Filled 2020-05-11: qty 100

## 2020-05-11 MED ORDER — HEPARIN (PORCINE) IN NACL 1000-0.9 UT/500ML-% IV SOLN
INTRAVENOUS | Status: AC
  Filled 2020-05-11: qty 500

## 2020-05-11 MED ORDER — MIDAZOLAM HCL 5 MG/5ML IJ SOLN
INTRAMUSCULAR | Status: AC
  Filled 2020-05-11: qty 5

## 2020-05-11 SURGICAL SUPPLY — 8 items
CABLE SURGICAL S-101-97-12 (CABLE) ×2 IMPLANT
LEAD TENDRIL MRI 46CM LPA1200M (Lead) ×1 IMPLANT
LEAD TENDRIL MRI 52CM LPA1200M (Lead) ×1 IMPLANT
PACEMAKER ASSURITY DR-RF (Pacemaker) ×1 IMPLANT
PAD PRO RADIOLUCENT 2001M-C (PAD) ×2 IMPLANT
SHEATH 8FR PRELUDE SNAP 13 (SHEATH) ×2 IMPLANT
SHEATH PROBE COVER 6X72 (BAG) ×1 IMPLANT
TRAY PACEMAKER INSERTION (PACKS) ×2 IMPLANT

## 2020-05-11 NOTE — Progress Notes (Addendum)
ELECTROPHYSIOLOGY ROUNDING NOTE    Primary Care Physician: [name], MD Referring Physician:  Dr Johney Frame  Admit Date: 05/09/2020  Reason for consultation:  AV block  Samantha [name] is a 70 y.o. female with a h/o cirrhosis, thrombocytopenia, atrial fibrillation, CRI, CAD s/p CABG 2001,and prior bradycardia who is admitted with symptomatic complete heart block. EF 35-40% chronically. She has recently been followed by Richardson Dopp for worsening CHF.   She presented 05/10/19 to [facility] with intermittent syncope and was found to have complete heart block with prolonged RR Intervals.  She was urgently transferred to [facility] and was noted to be in extremis with lactic acidosis and confusion.  She required urgent temporary pacing.  She has been maintained on dopamine.   Past Medical History:  Diagnosis Date  . Cataract    OU  . Chronic combined systolic and diastolic CHF (congestive heart failure) ([place])   . Cirrhosis of liver ([place])   . CKD (chronic kidney disease), stage III ([place])   . Coronary artery disease    a. s/p CABG 2001 w/ (LIMA-OM, SVG-D1, SVG-RCA). b. h/o multiple PCIs per Dr. Antionette Char note.  . Depression   . Esophageal varices ([place])    New 2013  . Gastroesophageal reflux disease   . History of pneumonia   . Hyperlipidemia   . Hypertension   . Hypertensive retinopathy    OU  . Obesity   . Osteoarthritis   . Pancytopenia ([place])   . Paroxysmal atrial flutter ([place])    a. dx 05/2016.  [name] Persistent atrial fibrillation ([place])    a. reported in hosp 07/2016, not on anticoag due to cirrhosis and liver disease, low platelets, varices.  . Thrombocytopenia ([place])    Past Surgical History:  Procedure Laterality Date  . ABDOMINAL HYSTERECTOMY    . BACK SURGERY    . CARDIAC CATHETERIZATION  2004   left internal mammary artery to the obtuse marginal  was found to be small and thread like.  The two grafts were patent.  The left circumflex had 90% in-stent restenosis and cutting  balloon angioplasty was performed followed by placement of a 3.0 x 56m Taxus drug -eluting stent.    .Marland KitchenCARDIAC CATHETERIZATION  2006   There was in-stent restenosis in the left circumflex and this was treated with cutting balloon angioplasty   . CARDIAC CATHETERIZATION  2008   vein graft to the to the obtuse marginal was patent, although small, left circumflex had 40% in-stent restenosis, ejection fraction 40-45%.  The patient was medically mananged.  .Marland KitchenCARDIAC CATHETERIZATION N/A 01/09/2015   Procedure: Left Heart Cath and Cors/Grafts Angiography;  Surgeon: HBelva Crome MD;  [facility];  Service: Cardiovascular;  Laterality: N/A;  . CATARACT EXTRACTION W/PHACO Right 05/17/2019   Procedure: CATARACT EXTRACTION PHACO AND INTRAOCULAR LENS PLACEMENT (IOC) (CDE: 14.99);  Surgeon: WBaruch Goldmann MD;  Location: [facility];  Service: Ophthalmology;  Laterality: Right;  . CATARACT EXTRACTION W/PHACO Left 06/10/2019   Procedure: CATARACT EXTRACTION PHACO AND INTRAOCULAR LENS PLACEMENT (IOC);  Surgeon: WBaruch Goldmann MD;  Location: [facility];  Service: Ophthalmology;  Laterality: Left;  CDE: 11.96  . cataract sx    . COLONOSCOPY  03/29/2011   Procedure: COLONOSCOPY;  Surgeon: MJamesetta So MD;  Location: [facility];  Service: Gastroenterology;  Laterality: N/A;  . CORONARY ARTERY BYPASS GRAFT  May 31,2001   x 3 with a vein graft to the first diagonal, vein graft to the right coronary  artery, and a free left internal mammary  artery to the obtuse marginal   . ESOPHAGEAL BANDING N/A 07/04/2012   Procedure: ESOPHAGEAL BANDING;  Surgeon: Rogene Houston, MD;  Location: [facility];  Service: Endoscopy;  Laterality: N/A;  . ESOPHAGEAL BANDING N/A 09/17/2012   Procedure: ESOPHAGEAL BANDING;  Surgeon: Rogene Houston, MD;  [facility];  Service: Endoscopy;  Laterality: N/A;  . ESOPHAGEAL BANDING N/A 10/22/2013   Procedure: ESOPHAGEAL BANDING;  Surgeon: Rogene Houston, MD;  [facility]  [facility];  Service: Endoscopy;  Laterality: N/A;  . ESOPHAGEAL BANDING N/A 11/28/2014   Procedure: ESOPHAGEAL BANDING;  Surgeon: Rogene Houston, MD;  [facility];  Service: Endoscopy;  Laterality: N/A;  . ESOPHAGEAL BANDING N/A 10/29/2015   Procedure: ESOPHAGEAL BANDING;  Surgeon: Rogene Houston, MD;  [facility];  Service: Endoscopy;  Laterality: N/A;  . ESOPHAGOGASTRODUODENOSCOPY  12/20/2011   Procedure: ESOPHAGOGASTRODUODENOSCOPY (EGD);  Surgeon: Jamesetta So, MD;  [facility];  Service: Gastroenterology;  Laterality: N/A;  . ESOPHAGOGASTRODUODENOSCOPY N/A 07/04/2012   Procedure: ESOPHAGOGASTRODUODENOSCOPY (EGD);  Surgeon: Rogene Houston, MD;  [facility];  Service: Endoscopy;  Laterality: N/A;  235-moved to 255 Ann to notify pt  . ESOPHAGOGASTRODUODENOSCOPY N/A 09/17/2012   Procedure: ESOPHAGOGASTRODUODENOSCOPY (EGD);  Surgeon: Rogene Houston, MD;  [facility];  Service: Endoscopy;  Laterality: N/A;  730  . ESOPHAGOGASTRODUODENOSCOPY N/A 10/22/2013   Procedure: ESOPHAGOGASTRODUODENOSCOPY (EGD);  Surgeon: Rogene Houston, MD;  [facility];  Service: Endoscopy;  Laterality: N/A;  730  . ESOPHAGOGASTRODUODENOSCOPY N/A 11/28/2014   Procedure: ESOPHAGOGASTRODUODENOSCOPY (EGD);  Surgeon: Rogene Houston, MD;  [facility];  Service: Endoscopy;  Laterality: N/A;  1:25  . ESOPHAGOGASTRODUODENOSCOPY N/A 10/29/2015   Procedure: ESOPHAGOGASTRODUODENOSCOPY (EGD);  Surgeon: Rogene Houston, MD;  [facility];  Service: Endoscopy;  Laterality: N/A;  12:00  . ESOPHAGOGASTRODUODENOSCOPY N/A 12/12/2016   Grade 1 and 2 varices in lower third of esophagus, post variceal banding scar distal esophagus, mild portal hypertensive gastropathy, Type 1 gastroesophageal varices without bleeding, normal duodenum and second portion of duodenum.   [name] FLEXIBLE SIGMOIDOSCOPY N/A 03/20/2017   external hemorrhoids  . JOINT  REPLACEMENT    . LEFT HEART CATH AND CORS/GRAFTS ANGIOGRAPHY N/A 08/15/2016   Procedure: Left Heart Cath and Cors/Grafts Angiography;  Surgeon: Jettie Booze, MD;  Location: [place] [facility];  Service: Cardiovascular;  Laterality: N/A;  . LEFT HEART CATHETERIZATION WITH CORONARY/GRAFT ANGIOGRAM N/A 12/25/2013   Procedure: LEFT HEART CATHETERIZATION WITH Beatrix Fetters;  Surgeon: Blane Ohara, MD;  Location: [facility];  Service: Cardiovascular;  Laterality: N/A;  . RIGHT HEART CATH N/A 05/29/2017   Procedure: RIGHT HEART CATH;  Surgeon: Jolaine Artist, MD;  Location: [place] [facility];  Service: Cardiovascular;  Laterality: N/A;  . rotator cuff left  2007  . TEMPORARY PACEMAKER N/A 05/09/2020   Procedure: TEMPORARY PACEMAKER;  Surgeon: Leonie Man, MD;  Location: [place] [facility];  Service: Cardiovascular;  Laterality: N/A;  . TONSILLECTOMY      . Chlorhexidine Gluconate Cloth  6 each Topical Daily  . ezetimibe  10 mg Oral QHS  . gentamicin irrigation  80 mg Irrigation On Call  . Gerhardt's butt cream   Topical TID  . guaiFENesin  600 mg Oral BID  . lidocaine  1 patch Transdermal Q24H  . methocarbamol  500 mg Oral TID  .  pantoprazole  40 mg Oral Daily  . senna-docusate  2 tablet Oral QHS  . sodium chloride flush  10-40 mL Intracatheter Q12H  . sodium chloride flush  3 mL Intravenous Q12H  . sodium chloride flush  3 mL Intravenous Q12H   . sodium chloride    . sodium chloride    . sodium chloride 10 mL/hr at 05/11/20 0600  . sodium chloride    . albumin human Stopped (05/11/20 0442)  . DOPamine 6 mcg/kg/min (05/11/20 0600)  . vancomycin      Allergies  Allergen Reactions  . Entresto [Sacubitril-Valsartan] Swelling    Swelling of eyes  . Vibra-Tab [Doxycycline] Nausea Only  . Acetaminophen Other (See Comments)    Liver problems  . Ace Inhibitors Other (See Comments)    UNSURE OF REACTION TYPE   . Cefaclor Itching and Rash    Pt tolerates  meropenem  . Cephalexin Itching and Rash    Pt tolerates meropenem  . Penicillins Rash    Has patient had a PCN reaction causing immediate rash, facial/tongue/throat swelling, SOB or lightheadedness with hypotension: YES Has patient had a PCN reaction causing severe rash involving mucus membranes or skin necrosis: NO Has patient had a PCN reaction that required hospitalization: YES Has patient had a PCN reaction occurring within the last 10 years: NO If all of the above answers are "NO", then may proceed with Cephalosporin use. Pt tolerates meropenem 02/19/20   . Pregabalin Other (See Comments)    Retains fluid  . Tape Itching and Rash    Takes skin right off with medical tape--PAPER TAPE ONLY    Social History   Socioeconomic History  . Marital status: Divorced    Spouse name: Not on file  . Number of children: Not on file  . Years of education: Not on file  . Highest education level: Not on file  Occupational History  . Occupation: Disabled  Tobacco Use  . Smoking status: Former Smoker    Packs/day: 0.50    Years: 20.00    Pack years: 10.00    Types: Cigarettes    Quit date: 07/21/1995    Years since quitting: 24.8  . Smokeless tobacco: Never Used  Vaping Use  . Vaping Use: Never used  Substance and Sexual Activity  . Alcohol use: No  . Drug use: No  . Sexual activity: Not on file  Other Topics Concern  . Not on file  Social History Narrative   Lives in [place] by herself.     Social Determinants of Health   Financial Resource Strain: Not on file  Food Insecurity: Not on file  Transportation Needs: Not on file  Physical Activity: Not on file  Stress: Not on file  Social Connections: Not on file  Intimate Partner Violence: Not on file    Family History  Problem Relation Age of Onset  . Heart attack Mother   . Diabetes Mother   . Heart attack Father 44       cause of death  . Diabetes Father   . Coronary artery disease Sister        CABG   . Diabetes  Sister   . Glaucoma Sister   . Diabetes Brother   . Colon cancer Neg Hx     ROS - back pain (from prior to admission), no CP, denies SOB  Physical Exam: Telemetry:  Underlying is difficult, suspect ST vs a flutter, rates 90's-100  Vitals:   05/11/20 0300 05/11/20 0400  05/11/20 0500 05/11/20 0600  BP: (!) 113/51 (!) 90/43 (!) 106/46 (!) 103/53  Pulse: 100 (!) 107 (!) 101 (!) 103  Resp: 12 12 15 12   Temp:  98.9 F (37.2 C)    TempSrc:  Oral    SpO2: 99% 100% 99% 99%  Weight:   79.8 kg   Height:        GEN- The patient is chronically ill appearing, alert but confused Head- normocephalic, atraumatic Eyes-  Sclera icteric, conjunctiva pink Ears- hearing intact Oropharynx- clear Neck- R neck CVL Lungs-   normal work of breathing Heart- slightly irregular, no significant murmurs GI- soft  Extremities- no clubbing, cyanosis, +edema  EKG- no new EKGs  Labs:   Lab Results  Component Value Date   WBC 5.2 05/11/2020   HGB 10.5 (L) 05/11/2020   HCT 32.7 (L) 05/11/2020   MCV 94.5 05/11/2020   PLT 50 (L) 05/11/2020    Recent Labs  Lab 05/09/20 2039 05/09/20 2041 05/11/20 0219  NA 134*   < > 136  K 4.6   < > 4.4  CL 98   < > 99  CO2 24   < > 27  BUN 36*   < > 37*  CREATININE 3.01*   < > 2.30*  CALCIUM 9.3   < > 9.3  PROT 7.1  --   --   BILITOT 9.3*  --   --   ALKPHOS 136*  --   --   ALT 24  --   --   AST 54*  --   --   GLUCOSE 161*   < > 106*   < > = values in this interval not displayed.   Lab Results  Component Value Date   CKTOTAL 34 04/26/2008   CKMB 0.7 04/26/2008   TROPONINI <0.03 04/12/2018    Lab Results  Component Value Date   CHOL 111 05/26/2017   CHOL 143 08/15/2016   CHOL 135 01/09/2015   Lab Results  Component Value Date   HDL 38 (L) 05/26/2017   HDL 65 08/15/2016   HDL 37 (L) 01/09/2015   Lab Results  Component Value Date   LDLCALC 54 05/26/2017   LDLCALC 68 08/15/2016   LDLCALC 74 01/09/2015   Lab Results  Component Value Date    TRIG 93 05/26/2017   TRIG 50 08/15/2016   TRIG 119 01/09/2015   Lab Results  Component Value Date   CHOLHDL 2.9 05/26/2017   CHOLHDL 2.2 08/15/2016   CHOLHDL 3.6 01/09/2015   No results found for: LDLDIRECT    ASSESSMENT AND PLAN:   1. Complete heart block The patient presents quite ill with lactic acidosis, confusion, and renal failure secondary to prolonged complete heart block. She is slowly improving  Temp wire in place, is capturing V, threshold this AM by Dr.Lambert 0.28m, set at 573m50bpm back up  Dr. LaQuentin Oreas seen and examined the patient this AM discussed plans for PPM, she remains agreeable Conducting this AM Will plan for CRT-P plts ar 50 this AM Plan for platelets today to start on call to PPM, discussed with blood bank and RN   2. Cardiogenic shock Felt 2/22 CHB Still on dopamine Conducting this AM Lactic acid was better, procalictonin <1<95XR today with improving pulmonary edema Afebrile WBC 5.2  3. CAD s/p CABG and PCI Looks like last cath was 2018 Stable No anginal complaints   4. afib Not a candidate for OAWindsorue to cirrhosis, GIBs, and low  plts.  5. Back pain     Not new, known compression fracture from last hospital stay last month     L2 compression fracture with ambulatory dysfunction--- PT recommends SNF -MRIof Lumbar Spine--- MRI findings indicates that pt is Nota candidate for kyphoplasty/vertebroplasty (Mild L1 and L2 compression fractures are new since December 16---does not appear to be very acute)     This was described as a mechanical trip/fall      On home pain med regime  6. cirrhosis     Non-alcoholic     [place] for procedure today     Hgb down from last couple days no clear bleeding, not far from admission     Admitting 11.7 > 12.9 > 12.6 > 10.5 today     follow  7. COPD      On home O2  8. Chronic CHF 9. ICM     Volume status appears stable this AM     LVEF 35-40%     BP has limited GDMT   Baldwin Jamaica, MD 05/11/2020  7:35 AM

## 2020-05-11 NOTE — Plan of Care (Signed)

## 2020-05-11 NOTE — Progress Notes (Signed)
Patient ID: Samantha [name], female   DOB: 03/10/1950, 70 y.o.   MRN: [id number]  PROGRESS NOTE    Samantha [name]  TZG:[id number] DOB: 04-20-1950 DOA: 05/09/2020 PCP: [name], MD   Brief Narrative:  70 year old female with history of CAD status post CABG x3, chronic systolic CHF with LVEF of 35 to 40% in January 2021, NASH with cirrhosis and portal hypertension, PAF, hypertension, L1-L2 compression fracture in February 2022, chronic hypoxic respiratory failure on home oxygen as needed presented with multiple falls, unresponsiveness and symptomatic bradycardia with heart rates in the 30s-40s.  She was transferred from [facility] to [facility] under cardiology care.  She was started on dopamine drip and underwent transvenous pacemaker.  TRH service was consulted on 05/10/2020 for determination of CHF versus pneumonia.  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  EP following and deciding about the timing of pacemaker placement  Cardiogenic shock Acute on chronic systolic heart failure Mild to moderate moderate mitral regurgitation Multivessel CAD status post CABG in 2001 with subsequent PCI Hyperlipidemia Paroxysmal A. fib, not on anticoagulation -Management as per primary cardiology team.  Currently on dopamine drip.  Not on diuretics currently. -Overall prognosis is guarded to poor.  Recommend palliative care consultation if primary team agrees.  Question of pneumonia -I think patient is in cardiogenic shock and fluid overloaded.  CT of the chest yesterday showed patchy areas of airspace opacities suggestive of multifocal pneumonia, viral or atypical; cannot rule out aspiration pneumonia -Currently on room air.  Patient has cough with productive sputum.  Unclear if this is bacterial pneumonia versus aspiration pneumonia versus just fluid overload.  Procalcitonin is suggestive that this might not be bacterial pneumonia -will empirically treat  with Unasyn for 5 days.  Acute kidney injury on chronic kidney disease stage IIIa -Probably from cardiorenal syndrome.  Creatinine improving.  Monitor  NASH with cirrhosis of liver and portal hypertension with elevated LFTs -will check LFTs tomorrow.  If LFTs are still abnormal, recommend GI consultation as per primary team.  Thrombocytopenia -Probably from above.  Worsening.  Monitor.  COPD Chronic hypoxic respiratory failure failure on supplemental oxygen as needed -Currently on room air.   Subjective: Patient seen and examined at bedside.  Does not feel well; feels weak and complains of cough with brown sputum.  No overnight fever or vomiting reported.  Objective: Vitals:   05/11/20 0815 05/11/20 0830 05/11/20 0845 05/11/20 0900  BP: 123/62 (!) 124/55 (!) 117/56 (!) 118/54  Pulse: (!) 103 (!) 103 (!) 103 (!) 102  Resp: 11 10 12 16   Temp:      TempSrc:      SpO2: 100% 100% 99% 100%  Weight:      Height:        Intake/Output Summary (Last 24 hours) at 05/11/2020 1010 Last data filed at 05/11/2020 0800 Gross per 24 hour  Intake 692.45 ml  Output 1050 ml  Net -357.55 ml   Filed Weights   05/09/20 1830 05/10/20 0430 05/11/20 0500  Weight: 74.8 kg 76.6 kg 79.8 kg    Examination:  General exam: Looks chronically ill.  Elderly female lying in bed.  Currently on room air.  No distress.  Poor historian. Respiratory system: Bilateral decreased breath sounds at bases with scattered crackles Cardiovascular system: S1 & S2 heard, tachycardic  gastrointestinal system: Abdomen is distended, soft and nontender. Normal bowel sounds heard. Extremities: No cyanosis, clubbing; lower extremity edema present Central  nervous system: Awake, slow to respond to questions.  No focal neurological deficits. Moving extremities Skin: No obvious ecchymosis/ulcers Psychiatry: Flat affect    Data Reviewed: I have personally reviewed following labs and imaging studies  CBC: Recent Labs  Lab  05/09/20 1307 05/09/20 2039 05/09/20 2041 05/10/20 0102 05/11/20 0219  WBC 5.5 9.4  --   --  5.2  NEUTROABS 3.9  --   --   --   --   HGB 11.7* 12.0 12.9 12.6 10.5*  HCT 37.0 37.2 38.0 37.0 32.7*  MCV 96.6 94.4  --   --  94.5  PLT 67* 89*  --   --  50*   Basic Metabolic Panel: Recent Labs  Lab 05/09/20 1307 05/09/20 2039 05/09/20 2041 05/10/20 0102 05/10/20 0607 05/11/20 0219  NA 139 134* 140 140 140 136  K 4.3 4.6 4.7 4.7 4.9 4.4  CL 100 98  --   --  103 99  CO2 23 24  --   --  26 27  GLUCOSE 111* 161*  --   --  111* 106*  BUN 36* 36*  --   --  37* 37*  CREATININE 2.97* 3.01*  --   --  2.79* 2.30*  CALCIUM 9.1 9.3  --   --  9.3 9.3  MG 1.9  --   --   --   --   --    GFR: Estimated Creatinine Clearance: 22 mL/min (A) (by C-G formula based on SCr of 2.3 mg/dL (H)). Liver Function Tests: Recent Labs  Lab 05/09/20 1307 05/09/20 2039  AST 52* 54*  ALT 24 24  ALKPHOS 136* 136*  BILITOT 7.3* 9.3*  PROT 6.7 7.1  ALBUMIN 2.6* 2.7*   No results for input(s): LIPASE, AMYLASE in the last 168 hours. No results for input(s): AMMONIA in the last 168 hours. Coagulation Profile: Recent Labs  Lab 05/10/20 1642 05/11/20 0219  INR 2.1* 2.2*   Cardiac Enzymes: No results for input(s): CKTOTAL, CKMB, CKMBINDEX, TROPONINI in the last 168 hours. BNP (last 3 results) No results for input(s): PROBNP in the last 8760 hours. HbA1C: No results for input(s): HGBA1C in the last 72 hours. CBG: No results for input(s): GLUCAP in the last 168 hours. Lipid Profile: No results for input(s): CHOL, HDL, LDLCALC, TRIG, CHOLHDL, LDLDIRECT in the last 72 hours. Thyroid Function Tests: No results for input(s): TSH, T4TOTAL, FREET4, T3FREE, THYROIDAB in the last 72 hours. Anemia Panel: No results for input(s): VITAMINB12, FOLATE, FERRITIN, TIBC, IRON, RETICCTPCT in the last 72 hours. Sepsis Labs: Recent Labs  Lab 05/09/20 1506 05/09/20 2039 05/10/20 0100 05/10/20 0607 05/10/20 1642  05/11/20 0219  PROCALCITON  --   --   --   --  0.10 <0.10  LATICACIDVEN 6.9* 3.8* 2.3* 2.4*  --   --     Recent Results (from the past 240 hour(s))  Resp Panel by RT-PCR (Flu A&B, Covid) Nasopharyngeal Swab     Status: None   Collection Time: 05/09/20  1:05 PM   Specimen: Nasopharyngeal Swab; Nasopharyngeal(NP) swabs in vial transport medium  Result Value Ref Range Status   SARS Coronavirus 2 by RT PCR NEGATIVE NEGATIVE Final    Comment: (NOTE) SARS-CoV-2 target nucleic acids are NOT DETECTED.  The SARS-CoV-2 RNA is generally detectable in upper respiratory specimens during the acute phase of infection. The lowest concentration of SARS-CoV-2 viral copies this assay can detect is 138 copies/mL. A negative result does not preclude SARS-Cov-2 infection and should not  be used as the sole basis for treatment or other patient management decisions. A negative result may occur with  improper specimen collection/handling, submission of specimen other than nasopharyngeal swab, presence of viral mutation(s) within the areas targeted by this assay, and inadequate number of viral copies(<138 copies/mL). A negative result must be combined with clinical observations, patient history, and epidemiological information. The expected result is Negative.  Fact Sheet for Patients:  EntrepreneurPulse.com.au  Fact Sheet for Healthcare Providers:  IncredibleEmployment.be  This test is no t yet approved or cleared by the [place] FDA and  has been authorized for detection and/or diagnosis of SARS-CoV-2 by FDA under an Emergency Use Authorization (EUA). This EUA will remain  in effect (meaning this test can be used) for the duration of the COVID-19 declaration under Section 564(b)(1) of the Act, 21 U.S.C.section 360bbb-3(b)(1), unless the authorization is terminated  or revoked sooner.       Influenza A by PCR NEGATIVE NEGATIVE Final   Influenza B by PCR  NEGATIVE NEGATIVE Final    Comment: (NOTE) The Xpert Xpress SARS-CoV-2/FLU/RSV plus assay is intended as an aid in the diagnosis of influenza from Nasopharyngeal swab specimens and should not be used as a sole basis for treatment. Nasal washings and aspirates are unacceptable for Xpert Xpress SARS-CoV-2/FLU/RSV testing.  Fact Sheet for Patients: EntrepreneurPulse.com.au  Fact Sheet for Healthcare Providers: IncredibleEmployment.be  This test is not yet approved or cleared by the [place] FDA and has been authorized for detection and/or diagnosis of SARS-CoV-2 by FDA under an Emergency Use Authorization (EUA). This EUA will remain in effect (meaning this test can be used) for the duration of the COVID-19 declaration under Section 564(b)(1) of the Act, 21 U.S.C. section 360bbb-3(b)(1), unless the authorization is terminated or revoked.  Performed at [facility], [address], [place], [place] [address]   MRSA PCR Screening     Status: None   Collection Time: 05/09/20  2:31 PM   Specimen: Nasopharyngeal  Result Value Ref Range Status   MRSA by PCR NEGATIVE NEGATIVE Final    Comment:        The GeneXpert MRSA Assay (FDA approved for NASAL specimens only), is one component of a comprehensive MRSA colonization surveillance program. It is not intended to diagnose MRSA infection nor to guide or monitor treatment for MRSA infections. Performed at [facility], [place] [address], [place], [place] [address]   Surgical PCR screen     Status: None   Collection Time: 05/10/20 10:14 AM   Specimen: Nasal Mucosa; Nasal Swab  Result Value Ref Range Status   MRSA, PCR NEGATIVE NEGATIVE Final   Staphylococcus aureus NEGATIVE NEGATIVE Final    Comment: (NOTE) The Xpert SA Assay (FDA approved for NASAL specimens in patients 55 years of age and older), is one component of a comprehensive surveillance program. It is not intended to diagnose  infection nor to guide or monitor treatment. Performed at [facility], [place] [address], [place], [place] [address]          Radiology Studies: CT CHEST WO CONTRAST  Result Date: 05/10/2020 CLINICAL DATA:  History of pneumonia by report. Multifocal opacities on prior imaging. EXAM: CT CHEST WITHOUT CONTRAST TECHNIQUE: Multidetector CT imaging of the chest was performed following the standard protocol without IV contrast. COMPARISON:  May 17, 2019. FINDINGS: Cardiovascular: RIGHT-sided transvenous pacer terminates in the pulmonary artery outflow tract in the proximal main pulmonary artery. Calcified atheromatous plaque of the thoracic aorta  with normal caliber. Three-vessel coronary artery disease. Heart size is enlarged without pericardial effusion. Central pulmonary vasculature engorged unchanged from prior imaging. Limited assessment of cardiovascular structures given lack of intravenous contrast. Mediastinum/Nodes: No adenopathy in the mediastinum. Patulous esophagus with debris in the lumen. Scattered lymph nodes throughout the chest mildly prominent without pathologic enlargement. Lungs/Pleura: Patchy areas of airspace opacity throughout the chest. Nodule in the RIGHT lung base (image 114, series 8. Mainly ground-glass. 13 mm nodular ground-glass focus in the RIGHT lung base on image 103. Scattered ground-glass nodules essentially throughout the chest slightly more numerous in the mid chest though distributed in the upper lobes as well. Septal thickening. Basilar atelectasis. Upper Abdomen: Lobular hepatic contours. Cholelithiasis without pericholecystic stranding. Signs of portosystemic collateral suggested in the upper abdomen not well evaluated. Musculoskeletal: Cement augmentation of the L1 vertebral level seen on prior imaging. Osteopenia.  Signs of sternotomy. Worsening loss of height about the L2. Approximately 40-50% loss of height at L2, progressed from previous imaging where there  was less than 40% loss of height. Superior endplate of L3 also with signs of fracture, likely new. L3 incompletely imaged. IMPRESSION: 1. Patchy areas of airspace opacity throughout the chest with basilar atelectasis. Findings could be seen in the setting of multifocal pneumonia, likely viral or atypical process. Given the patulous debris-filled esophagus would also entertained the possibility of a component of aspiration pneumonitis though findings are distributed throughout the lungs rather than at the basilar portions of the chest. 2. Transvenous pacer device is in the pulmonary artery outflow tract. Correlate with pacer function and consider repositioning as warranted. 3. Worsening of lumbar spine compression fractures at L2 and new compression fracture at L3. Loss of height is greatest at L2 as described with prior cement augmentation of L1. L3 is incompletely imaged. 4. Three-vessel coronary artery disease. 5. Patulous esophagus with debris in the lumen this may reflect reflux or esophageal dysmotility. 6. Signs of liver disease. 7. Cholelithiasis. 8. Aortic atherosclerosis. These results will be called to the ordering clinician or representative by the Radiologist Assistant, and communication documented in the PACS or [employer]. Aortic Atherosclerosis (ICD10-I70.0). Electronically Signed   By: Zetta Bills M.D.   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  Upon insertion of the sheath, the patient went into AI VR with rates of the 110to 120s -> at that point I decided to forego right heart catheterization and just place the temporary pacemaker. => Temporary pacer used to overdrive pace out of AI VR. 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 RECOMMENDATIONS  Return to nursing unit,  continue on dopamine wean as tolerated for blood pressures.  Patient is now essentially pacemaker dependent-would need backup transcutaneous pacing in place if TPM becomes unstable We will need formal EP evaluation to determine timing of PPM placement. Glenetta Hew, MD   DG Chest Port 1 View  Result Date: 05/11/2020 CLINICAL DATA:  Congestive heart failure and heart block. EXAM: PORTABLE CHEST 1 VIEW COMPARISON:  05/10/2020 FINDINGS: Stable cardiac enlargement. Right jugular transvenous pacemaker demonstrates stable position with the tip near the expected region of the pulmonary outflow tract. Pulmonary edema pattern improved since the prior chest x-ray. No pleural effusions or pneumothorax. IMPRESSION: Improving pulmonary edema. Stable cardiomegaly. Electronically Signed   By: Jenness Corner.D.  On: 05/11/2020 08:10   DG CHEST PORT 1 VIEW  Result Date: 05/10/2020 CLINICAL DATA:  Shortness of breath and chest pain EXAM: PORTABLE CHEST 1 VIEW COMPARISON:  February 17, 2020 FINDINGS: There is cardiomegaly with pulmonary vascularity within normal limits. Patient is status post coronary artery bypass grafting. Apparent pacemaker lead in right atrial region. There is ill-defined airspace opacity in each upper lobe and left base region. No consolidation. No appreciable adenopathy. There is aortic atherosclerosis. No bone lesions. IMPRESSION: Ill-defined airspace opacity in each upper lobe and left base regions. Appearance felt to be indicative of multifocal pneumonia. Question atypical organism pneumonia. Check of COVID-19 status advised. Cardiomegaly. Status post coronary artery bypass grafting. Apparent pacemaker via trans jugular approach with lead attached to right heart, likely right atrium. Aortic Atherosclerosis (ICD10-I70.0). Electronically Signed   By: Lowella Grip III M.D.   On: 05/10/2020 10:53   ECHOCARDIOGRAM COMPLETE  Result Date: 05/09/2020    ECHOCARDIOGRAM REPORT   Patient Name:    ANNASTASIA HASKINS Date of Exam: 05/09/2020 Medical Rec #:  [id number]      Height:       62.0 in Accession #:    [id number]     Weight:       163.0 lb Date of Birth:  May 15, 1950      BSA:          1.753 m Patient Age:    26 years       BP:           105/42 mmHg Patient Gender: F              HR:           46 bpm. Exam Location:  Inpatient Procedure: 2D Echo, Cardiac Doppler, Color Doppler and Intracardiac            Opacification Agent STAT ECHO Indications:    Complete heart block  History:        Patient has prior history of Echocardiogram examinations, most                 recent 01/24/2020. CHF, CAD, Prior CABG, PAD and COPD,                 Arrythmias:Atrial Flutter; Risk Factors:Diabetes, Hypertension                 and Dyslipidemia.  Sonographer:    Dustin Flock Referring Phys: [id number] [place]  1. Left ventricular ejection fraction, by estimation, is 35 to 40%. The left ventricle has moderately decreased function. The left ventricle demonstrates global hypokinesis and RWMA as noted in findings. There is mild left ventricular hypertrophy. Left ventricular diastolic parameters are indeterminate.  2. Respiratory related septal shift.  3. Right ventricular systolic function is mildly reduced. The right ventricular size is normal. There is moderately elevated pulmonary artery systolic pressure. The estimated right ventricular systolic pressure is 37.1 mmHg.  4. Left atrial size was moderately dilated.  5. Right atrial size was moderately dilated.  6. Diastolic and systolic mitral valve regurgitation.. The mitral valve is grossly normal. Mild to moderate mitral valve regurgitation.  7. Tricuspid valve regurgitation is moderate.  8. The aortic valve is grossly normal. There is mild calcification of the aortic valve. Aortic valve regurgitation is not visualized. No aortic stenosis is present.  9. The inferior vena cava is dilated in size with <50% respiratory variability, suggesting right  atrial pressure of 15 mmHg. FINDINGS  Left  Ventricle: Left ventricular ejection fraction, by estimation, is 35 to 40%. The left ventricle has moderately decreased function. The left ventricle demonstrates global hypokinesis. Definity contrast agent was given IV to delineate the left ventricular endocardial borders. The left ventricular internal cavity size was normal in size. There is mild left ventricular hypertrophy. Abnormal (paradoxical) septal motion, consistent with left bundle branch block. Left ventricular diastolic parameters are indeterminate.  LV Wall Scoring: The inferior wall, posterior wall, and mid inferoseptal segment are akinetic. The mid anteroseptal segment is hypokinetic. Right Ventricle: The right ventricular size is normal. No increase in right ventricular wall thickness. Right ventricular systolic function is mildly reduced. There is moderately elevated pulmonary artery systolic pressure. The tricuspid regurgitant velocity is 3.08 m/s, and with an assumed right atrial pressure of 15 mmHg, the estimated right ventricular systolic pressure is 01.6 mmHg. Left Atrium: Left atrial size was moderately dilated. Right Atrium: Right atrial size was moderately dilated. Pericardium: There is no evidence of pericardial effusion. Mitral Valve: Diastolic and systolic mitral valve regurgitation. The mitral valve is grossly normal. Mild to moderate mitral valve regurgitation. MV peak gradient, 16.6 mmHg. The mean mitral valve gradient is 3.0 mmHg with average heart rate of 47 bpm. Tricuspid Valve: The tricuspid valve is normal in structure. Tricuspid valve regurgitation is moderate . No evidence of tricuspid stenosis. Aortic Valve: The aortic valve is grossly normal. There is mild calcification of the aortic valve. Aortic valve regurgitation is not visualized. No aortic stenosis is present. Aortic valve mean gradient measures 8.0 mmHg. Aortic valve peak gradient measures 19.0 mmHg. Aortic valve area, by VTI  measures 3.12 cm. Pulmonic Valve: The pulmonic valve was normal in structure. Pulmonic valve regurgitation is trivial. Aorta: The aortic root is normal in size and structure. Venous: The inferior vena cava is dilated in size with less than 50% respiratory variability, suggesting right atrial pressure of 15 mmHg. IAS/Shunts: No atrial level shunt detected by color flow Doppler.  LEFT VENTRICLE PLAX 2D LVIDd:         5.20 cm  Diastology LVIDs:         4.70 cm  LV e' medial:    6.09 cm/s LV PW:         1.20 cm  LV E/e' medial:  26.3 LV IVS:        1.20 cm  LV e' lateral:   9.68 cm/s LVOT diam:     2.30 cm  LV E/e' lateral: 16.5 LV SV:         133 LV SV Index:   76 LVOT Area:     4.15 cm  RIGHT VENTRICLE RV Basal diam:  3.30 cm RV S prime:     9.79 cm/s TAPSE (M-mode): 2.1 cm LEFT ATRIUM             Index       RIGHT ATRIUM           Index LA diam:        4.90 cm 2.80 cm/m  RA Area:     22.90 cm LA Vol (A2C):   70.9 ml 40.46 ml/m RA Volume:   75.40 ml  43.02 ml/m LA Vol (A4C):   69.3 ml 39.54 ml/m LA Biplane Vol: 73.7 ml 42.05 ml/m  AORTIC VALVE                    PULMONIC VALVE AV Area (Vmax):    2.65 cm     PV Vmax:  1.86 m/s AV Area (Vmean):   2.78 cm     PV Peak grad:  13.8 mmHg AV Area (VTI):     3.12 cm AV Vmax:           218.00 cm/s AV Vmean:          131.000 cm/s AV VTI:            0.425 m AV Peak Grad:      19.0 mmHg AV Mean Grad:      8.0 mmHg LVOT Vmax:         139.00 cm/s LVOT Vmean:        87.700 cm/s LVOT VTI:          0.319 m LVOT/AV VTI ratio: 0.75  AORTA Ao Root diam: 2.90 cm MITRAL VALVE                TRICUSPID VALVE MV Area (PHT): 7.16 cm     TR Peak grad:   37.9 mmHg MV Area VTI:   2.12 cm     TR Vmax:        308.00 cm/s MV Peak grad:  16.6 mmHg MV Mean grad:  3.0 mmHg     SHUNTS MV Vmax:       2.04 m/s     Systemic VTI:  0.32 m MV Vmean:      91.0 cm/s    Systemic Diam: 2.30 cm MV Decel Time: 106 msec MV E velocity: 160.00 cm/s MV A velocity: 131.00 cm/s MV E/A ratio:  1.22  Cherlynn Kaiser MD Electronically signed by Cherlynn Kaiser MD Signature Date/Time: 05/09/2020/5:23:45 PM    Final         Scheduled Meds: . Chlorhexidine Gluconate Cloth  6 each Topical Daily  . ezetimibe  10 mg Oral QHS  . gentamicin irrigation  80 mg Irrigation On Call  . guaiFENesin  600 mg Oral BID  . lidocaine  1 patch Transdermal Q24H  . methocarbamol  500 mg Oral TID  . pantoprazole  40 mg Oral Daily  . senna-docusate  2 tablet Oral QHS  . sodium chloride flush  10-40 mL Intracatheter Q12H  . sodium chloride flush  3 mL Intravenous Q12H  . sodium chloride flush  3 mL Intravenous Q12H   Continuous Infusions: . sodium chloride    . sodium chloride 10 mL/hr at 05/11/20 0800  . sodium chloride    . albumin human Stopped (05/11/20 0442)  . DOPamine 6 mcg/kg/min (05/11/20 0800)  . vancomycin            Aline August, MD [facility] 05/11/2020, 10:10 AM

## 2020-05-11 NOTE — Progress Notes (Addendum)
Progress Note  Patient Name: Samantha [name] Date of Encounter: 05/11/2020  [facility] Cardiologist: Sherren Mocha, MD   Subjective   70 yo F with hx of CAD s/p CABG with atretic LIMA circa 2019, HFrEF EF 35%, PAF, Cirrohosis and thrombocytopenia felt not to be an [facility] candidate, who has required urgent TVP through course.  Originally DNR but Full Code per patient through course.  Patient notes that she is doing OK.  Since day prior notes back pain with persistent laying in bed.  Overnight call for question of TVP pacing; has not needed to pace overnight.  No chest pain or pressure.  No SOB.   Device set to back up rate of 60 bpm/5 mA  Inpatient Medications    Scheduled Meds: . Chlorhexidine Gluconate Cloth  6 each Topical Daily  . ezetimibe  10 mg Oral QHS  . gentamicin irrigation  80 mg Irrigation On Call  . Gerhardt's butt cream   Topical TID  . guaiFENesin  600 mg Oral BID  . lidocaine  1 patch Transdermal Q24H  . methocarbamol  500 mg Oral TID  . pantoprazole  40 mg Oral Daily  . senna-docusate  2 tablet Oral QHS  . sodium chloride flush  10-40 mL Intracatheter Q12H  . sodium chloride flush  3 mL Intravenous Q12H  . sodium chloride flush  3 mL Intravenous Q12H   Continuous Infusions: . sodium chloride    . sodium chloride 10 mL/hr at 05/11/20 0600  . sodium chloride    . albumin human Stopped (05/11/20 0442)  . DOPamine 6 mcg/kg/min (05/11/20 0600)  . vancomycin     PRN Meds: sodium chloride, sodium chloride, acetaminophen, albuterol, ipratropium, oxyCODONE, pneumococcal 23 valent vaccine, sodium chloride flush, sodium chloride flush, sodium chloride flush   Vital Signs    Vitals:   05/11/20 0400 05/11/20 0500 05/11/20 0600 05/11/20 0755  BP: (!) 90/43 (!) 106/46 (!) 103/53   Pulse: (!) 107 (!) 101 (!) 103   Resp: 12 15 12    Temp: 98.9 F (37.2 C)   98.4 F (36.9 C)  TempSrc: Oral   Oral  SpO2: 100% 99% 99%   Weight:  79.8 kg    Height:         Intake/Output Summary (Last 24 hours) at 05/11/2020 0834 Last data filed at 05/11/2020 0600 Gross per 24 hour  Intake 683.52 ml  Output 1200 ml  Net -516.48 ml   Last 3 Weights 05/11/2020 05/10/2020 05/09/2020  Weight (lbs) 175 lb 14.8 oz 168 lb 14 oz 164 lb 14.5 oz  Weight (kg) 79.8 kg 76.6 kg 74.8 kg      Telemetry    A fib with PVCs - Personally Reviewed  ECG    No new tracing last 24 hours - Personally Reviewed  Physical Exam   GEN: Chronically ill appearing, comfortable and NAD Neck: TVP in place Cardiac: RRR, II/VI holosystolic murmur without rubs, or gallops  Respiratory: Poor air movement in bases, no crackles on exam today GI: Soft, nontender, non-distended  MS: Trace pedal edema; warm; toe nails painted red Neuro:  Nonfocal  Psych: Normal affect   Labs    High Sensitivity Troponin:   Recent Labs  Lab 04/12/20 1411 04/12/20 1527 04/12/20 2118 05/09/20 1307 05/09/20 1506  TROPONINIHS 27* 28* 29* 81* 86*      Chemistry Recent Labs  Lab 05/09/20 1307 05/09/20 2039 05/09/20 2041 05/10/20 0102 05/10/20 0607 05/11/20 0219  NA 139 134*   < >  140 140 136  K 4.3 4.6   < > 4.7 4.9 4.4  CL 100 98  --   --  103 99  CO2 23 24  --   --  26 27  GLUCOSE 111* 161*  --   --  111* 106*  BUN 36* 36*  --   --  37* 37*  CREATININE 2.97* 3.01*  --   --  2.79* 2.30*  CALCIUM 9.1 9.3  --   --  9.3 9.3  PROT 6.7 7.1  --   --   --   --   ALBUMIN 2.6* 2.7*  --   --   --   --   AST 52* 54*  --   --   --   --   ALT 24 24  --   --   --   --   ALKPHOS 136* 136*  --   --   --   --   BILITOT 7.3* 9.3*  --   --   --   --   GFRNONAA 16* 16*  --   --  18* 22*  ANIONGAP 16* 12  --   --  11 10   < > = values in this interval not displayed.     Hematology Recent Labs  Lab 05/09/20 1307 05/09/20 2039 05/09/20 2041 05/10/20 0102 05/11/20 0219  WBC 5.5 9.4  --   --  5.2  RBC 3.83* 3.94  --   --  3.46*  HGB 11.7* 12.0 12.9 12.6 10.5*  HCT 37.0 37.2 38.0 37.0 32.7*   MCV 96.6 94.4  --   --  94.5  MCH 30.5 30.5  --   --  30.3  MCHC 31.6 32.3  --   --  32.1  RDW 18.4* 18.0*  --   --  17.5*  PLT 67* 89*  --   --  50*    BNP Recent Labs  Lab 05/09/20 1307  BNP 488.0*     DDimer No results for input(s): DDIMER in the last 168 hours.   Radiology    CT CHEST WO CONTRAST  Result Date: 05/10/2020 CLINICAL DATA:  History of pneumonia by report. Multifocal opacities on prior imaging. EXAM: CT CHEST WITHOUT CONTRAST TECHNIQUE: Multidetector CT imaging of the chest was performed following the standard protocol without IV contrast. COMPARISON:  May 17, 2019. FINDINGS: Cardiovascular: RIGHT-sided transvenous pacer terminates in the pulmonary artery outflow tract in the proximal main pulmonary artery. Calcified atheromatous plaque of the thoracic aorta with normal caliber. Three-vessel coronary artery disease. Heart size is enlarged without pericardial effusion. Central pulmonary vasculature engorged unchanged from prior imaging. Limited assessment of cardiovascular structures given lack of intravenous contrast. Mediastinum/Nodes: No adenopathy in the mediastinum. Patulous esophagus with debris in the lumen. Scattered lymph nodes throughout the chest mildly prominent without pathologic enlargement. Lungs/Pleura: Patchy areas of airspace opacity throughout the chest. Nodule in the RIGHT lung base (image 114, series 8. Mainly ground-glass. 13 mm nodular ground-glass focus in the RIGHT lung base on image 103. Scattered ground-glass nodules essentially throughout the chest slightly more numerous in the mid chest though distributed in the upper lobes as well. Septal thickening. Basilar atelectasis. Upper Abdomen: Lobular hepatic contours. Cholelithiasis without pericholecystic stranding. Signs of portosystemic collateral suggested in the upper abdomen not well evaluated. Musculoskeletal: Cement augmentation of the L1 vertebral level seen on prior imaging. Osteopenia.  Signs  of sternotomy. Worsening loss of height about the L2. Approximately 40-50% loss of  height at L2, progressed from previous imaging where there was less than 40% loss of height. Superior endplate of L3 also with signs of fracture, likely new. L3 incompletely imaged. IMPRESSION: 1. Patchy areas of airspace opacity throughout the chest with basilar atelectasis. Findings could be seen in the setting of multifocal pneumonia, likely viral or atypical process. Given the patulous debris-filled esophagus would also entertained the possibility of a component of aspiration pneumonitis though findings are distributed throughout the lungs rather than at the basilar portions of the chest. 2. Transvenous pacer device is in the pulmonary artery outflow tract. Correlate with pacer function and consider repositioning as warranted. 3. Worsening of lumbar spine compression fractures at L2 and new compression fracture at L3. Loss of height is greatest at L2 as described with prior cement augmentation of L1. L3 is incompletely imaged. 4. Three-vessel coronary artery disease. 5. Patulous esophagus with debris in the lumen this may reflect reflux or esophageal dysmotility. 6. Signs of liver disease. 7. Cholelithiasis. 8. Aortic atherosclerosis. These results will be called to the ordering clinician or representative by the Radiologist Assistant, and communication documented in the PACS or [employer]. Aortic Atherosclerosis (ICD10-I70.0). Electronically Signed   By: Zetta Bills M.D.   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  Upon insertion of the sheath, the patient went into AI VR with rates of the 110to 120s -> at that point I decided to forego right heart catheterization and just place the temporary pacemaker.  => Temporary pacer used to overdrive pace out of AI VR. 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 RECOMMENDATIONS  Return to nursing unit, continue on dopamine wean as tolerated for blood pressures.  Patient is now essentially pacemaker dependent-would need backup transcutaneous pacing in place if TPM becomes unstable We will need formal EP evaluation to determine timing of PPM placement. Glenetta Hew, MD   DG Chest Port 1 View  Result Date: 05/11/2020 CLINICAL DATA:  Congestive heart failure and heart block. EXAM: PORTABLE CHEST 1 VIEW COMPARISON:  05/10/2020 FINDINGS: Stable cardiac enlargement. Right jugular transvenous pacemaker demonstrates stable position with the tip near the expected region of the pulmonary outflow tract. Pulmonary edema pattern improved since the prior chest x-ray. No pleural effusions or pneumothorax. IMPRESSION: Improving pulmonary edema. Stable cardiomegaly. Electronically Signed   By: Aletta Edouard M.D.   On: 05/11/2020 08:10   DG CHEST PORT 1 VIEW  Result Date: 05/10/2020 CLINICAL DATA:  Shortness of breath and chest pain EXAM: PORTABLE CHEST 1 VIEW COMPARISON:  February 17, 2020 FINDINGS: There is cardiomegaly with pulmonary vascularity within normal limits. Patient is status post coronary artery bypass grafting. Apparent pacemaker lead in right atrial region. There is ill-defined airspace opacity in each upper lobe and left base region. No consolidation. No appreciable adenopathy. There is aortic atherosclerosis. No bone lesions. IMPRESSION: Ill-defined airspace opacity in each upper lobe and left base regions. Appearance felt to be indicative of multifocal pneumonia. Question atypical organism pneumonia. Check of COVID-19 status advised. Cardiomegaly. Status post coronary artery bypass grafting. Apparent pacemaker via trans jugular approach with lead attached to right heart, likely right atrium. Aortic Atherosclerosis (ICD10-I70.0). Electronically Signed   By: Lowella Grip III M.D.   On: 05/10/2020 10:53   ECHOCARDIOGRAM COMPLETE  Result Date: 05/09/2020    ECHOCARDIOGRAM  REPORT   Patient Name:   Samantha [name] Date of Exam: 05/09/2020 Medical Rec #:  [id number]      Height:       62.0 in Accession #:    [id number]     Weight:       163.0 lb Date of Birth:  07/05/1950      BSA:          1.753 m Patient Age:    57 years       BP:           105/42 mmHg Patient Gender: F              HR:           46 bpm. Exam Location:  Inpatient Procedure: 2D Echo, Cardiac Doppler, Color Doppler and Intracardiac            Opacification Agent STAT ECHO Indications:    Complete heart block  History:        Patient has prior history of Echocardiogram examinations, most                 recent 01/24/2020. CHF, CAD, Prior CABG, PAD and COPD,                 Arrythmias:Atrial Flutter; Risk Factors:Diabetes, Hypertension                 and Dyslipidemia.  Sonographer:    Dustin Flock Referring Phys: [id number] [place]  1. Left ventricular ejection fraction, by estimation, is 35 to 40%. The left ventricle has moderately decreased function. The left ventricle demonstrates global hypokinesis and RWMA as noted in findings. There is mild left ventricular hypertrophy. Left ventricular diastolic parameters are indeterminate.  2. Respiratory related septal shift.  3. Right ventricular systolic function is mildly reduced. The right ventricular size is normal. There is moderately elevated pulmonary artery systolic pressure. The estimated right ventricular systolic pressure is 28.0 mmHg.  4. Left atrial size was moderately dilated.  5. Right atrial size was moderately dilated.  6. Diastolic and systolic mitral valve regurgitation.. The mitral valve is grossly normal. Mild to moderate mitral valve regurgitation.  7. Tricuspid valve regurgitation is moderate.  8. The aortic valve is grossly normal. There is mild calcification of the aortic valve. Aortic valve regurgitation is not  visualized. No aortic stenosis is present.  9. The inferior vena cava is dilated in size with <50% respiratory variability, suggesting right atrial pressure of 15 mmHg. FINDINGS  Left Ventricle: Left ventricular ejection fraction, by estimation, is 35 to 40%. The left ventricle has moderately decreased function. The left ventricle demonstrates global hypokinesis. Definity contrast agent was given IV to delineate the left ventricular endocardial borders. The left ventricular internal cavity size was normal in size. There is mild left ventricular hypertrophy. Abnormal (paradoxical) septal motion, consistent with left bundle branch block. Left ventricular diastolic parameters are indeterminate.  LV Wall Scoring: The inferior wall, posterior wall, and mid inferoseptal segment are akinetic. The mid anteroseptal segment is hypokinetic. Right Ventricle: The right ventricular size is normal. No increase in right ventricular wall thickness. Right ventricular systolic function is mildly reduced. There is moderately elevated pulmonary artery systolic pressure. The tricuspid regurgitant velocity is 3.08 m/s, and with an assumed right atrial pressure of 15 mmHg, the estimated right ventricular systolic pressure is 03.4 mmHg. Left Atrium: Left atrial size was moderately dilated. Right Atrium: Right atrial size was moderately dilated. Pericardium: There is  no evidence of pericardial effusion. Mitral Valve: Diastolic and systolic mitral valve regurgitation. The mitral valve is grossly normal. Mild to moderate mitral valve regurgitation. MV peak gradient, 16.6 mmHg. The mean mitral valve gradient is 3.0 mmHg with average heart rate of 47 bpm. Tricuspid Valve: The tricuspid valve is normal in structure. Tricuspid valve regurgitation is moderate . No evidence of tricuspid stenosis. Aortic Valve: The aortic valve is grossly normal. There is mild calcification of the aortic valve. Aortic valve regurgitation is not visualized. No aortic  stenosis is present. Aortic valve mean gradient measures 8.0 mmHg. Aortic valve peak gradient measures 19.0 mmHg. Aortic valve area, by VTI measures 3.12 cm. Pulmonic Valve: The pulmonic valve was normal in structure. Pulmonic valve regurgitation is trivial. Aorta: The aortic root is normal in size and structure. Venous: The inferior vena cava is dilated in size with less than 50% respiratory variability, suggesting right atrial pressure of 15 mmHg. IAS/Shunts: No atrial level shunt detected by color flow Doppler.  LEFT VENTRICLE PLAX 2D LVIDd:         5.20 cm  Diastology LVIDs:         4.70 cm  LV e' medial:    6.09 cm/s LV PW:         1.20 cm  LV E/e' medial:  26.3 LV IVS:        1.20 cm  LV e' lateral:   9.68 cm/s LVOT diam:     2.30 cm  LV E/e' lateral: 16.5 LV SV:         133 LV SV Index:   76 LVOT Area:     4.15 cm  RIGHT VENTRICLE RV Basal diam:  3.30 cm RV S prime:     9.79 cm/s TAPSE (M-mode): 2.1 cm LEFT ATRIUM             Index       RIGHT ATRIUM           Index LA diam:        4.90 cm 2.80 cm/m  RA Area:     22.90 cm LA Vol (A2C):   70.9 ml 40.46 ml/m RA Volume:   75.40 ml  43.02 ml/m LA Vol (A4C):   69.3 ml 39.54 ml/m LA Biplane Vol: 73.7 ml 42.05 ml/m  AORTIC VALVE                    PULMONIC VALVE AV Area (Vmax):    2.65 cm     PV Vmax:       1.86 m/s AV Area (Vmean):   2.78 cm     PV Peak grad:  13.8 mmHg AV Area (VTI):     3.12 cm AV Vmax:           218.00 cm/s AV Vmean:          131.000 cm/s AV VTI:            0.425 m AV Peak Grad:      19.0 mmHg AV Mean Grad:      8.0 mmHg LVOT Vmax:         139.00 cm/s LVOT Vmean:        87.700 cm/s LVOT VTI:          0.319 m LVOT/AV VTI ratio: 0.75  AORTA Ao Root diam: 2.90 cm MITRAL VALVE                TRICUSPID VALVE MV Area (PHT): 7.16  cm     TR Peak grad:   37.9 mmHg MV Area VTI:   2.12 cm     TR Vmax:        308.00 cm/s MV Peak grad:  16.6 mmHg MV Mean grad:  3.0 mmHg     SHUNTS MV Vmax:       2.04 m/s     Systemic VTI:  0.32 m MV Vmean:       91.0 cm/s    Systemic Diam: 2.30 cm MV Decel Time: 106 msec MV E velocity: 160.00 cm/s MV A velocity: 131.00 cm/s MV E/A ratio:  1.22 Cherlynn Kaiser MD Electronically signed by Cherlynn Kaiser MD Signature Date/Time: 05/09/2020/5:23:45 PM    Final     Cardiac Studies   Echo 01/24/20: 1. Left ventricular ejection fraction, by estimation, is 35 to 40%. The  left ventricle has moderately decreased function. The left ventricle  demonstrates global hypokinesis. Left ventricular diastolic function could  not be evaluated. There is severe  hypokinesis of the left ventricular, entire inferior wall and  inferolateral wall.  2. Right ventricular systolic function is mildly reduced. The right  ventricular size is normal. There is mildly elevated pulmonary artery  systolic pressure.  3. Left atrial size was severely dilated.  4. Right atrial size was severely dilated.  5. The mitral valve is normal in structure. Mild to moderate mitral valve  regurgitation.  6. Tricuspid valve regurgitation is moderate.  7. The aortic valve is tricuspid. Aortic valve regurgitation is not  visualized. Mild aortic valve sclerosis is present, with no evidence of  aortic valve stenosis.  8. The inferior vena cava is normal in size with <50% respiratory  variability, suggesting right atrial pressure of 8 mmHg.   Patient Profile     71 y.o. female with history of multivessel CAD status post CABG in 2001 with subsequent PCIs, nonalcoholic cirrhosis with esophageal varices and thrombocytopenia, heart failure with reduced ejection fraction as low as 20 to 35%, HTN, HLD, Afib not on AC due to cirrhosis and varices, SSS, COPD on home O2 who presented to [facility] with an episode of unresponsiveness found to be profoundly bradycardic in the field initially responsive to atropine. On arrival to [facility], reportedly had episode of accelerated idioventricular rhythm-->10s pause in which CPR was almost initiated and then  she developed a bradycardic rhythm consistent with CHB. Subsequently transferred to [facility] now s/p TVP placement.  Assessment & Plan    #Complete Heart Block: #Accelerated Idioventricular Rhythm: -EP consulted, planned for CRT-P today -S/p TVP placement with appropriate capture -Wean dopamine as tolerated; down to 6 mcg; ability to wean off of dopamine post procedure will help [place] with long term plan and prognosis  #Cardiogenic Shock #FrEF 35% # Mitral Regurgitation (mild to moderate) # AKI on CKI - Secondary to complete heart block as above.  -Manage complete heart block as above -Improving lactates trended to near normal -Wean dopamine as tolerated -TTE with stable LVEF 28-36%, diastolic and systolic MR and TR (which will likely improve with pacing) -Consider diuresis later today - low sodium diet after procedure - baseline GFR 50-60; will continue to hold torsemide with improvement; will need discharge diuretic - holding MRA  #Multivessel CAD s/p CABG in 2001 with subsequent PCI: # HLD -NO ASA or Plavix with given thrombocytopenia and cirrhosis.  -GDMT limited due to cirrhosis and thrombocytopenia  -Continue zetia 32m daily -Manage complete heart block as above -Coronary angiography deferred yesterday due to AKI  and patient high risk for DAPT--can reassess once more clinically stable  #Paroxysmal Afib: Not on AC due to high risk of bleed with cirrhosis and thrombocytopenia. -No AC -Holding nodal agents due to CHB as above  #Non-alcoholic cirrhosis: Complicated by varices and thrombocytopenia. -Trend plt -No AC or DAPT at this time  #COPD on home O2: -Continue supplemental O2 -Inhalers prn  Full Code- briefly discussed with patient given long term outcome concern NPO Labs ordered SCDs for DVT prophylaxis  CRITICAL CARE Performed by: Mahesh A Chandrasekhar  Total critical care time: 45 minutes. Critical care time was exclusive of separately billable procedures and  treating other patients. Critical care was necessary to treat or prevent imminent or life-threatening deterioration. Critical care was time spent personally by me on the following activities: development of treatment plan with patient and/or surrogate as well as nursing, discussions with consultants, evaluation of patient's response to treatment, examination of patient, obtaining history from patient or surrogate, ordering and performing treatments and interventions, ordering and review of laboratory studies, ordering and review of radiographic studies, pulse oximetry and re-evaluation of patient's condition.  For questions or updates, please contact [place] Please consult [email or web address] for contact info under        Signed, Werner Lean, MD  05/11/2020, 8:34 AM

## 2020-05-12 ENCOUNTER — Inpatient Hospital Stay (HOSPITAL_COMMUNITY): Payer: Medicare Other

## 2020-05-12 ENCOUNTER — Encounter (HOSPITAL_COMMUNITY): Payer: Self-pay | Admitting: Cardiology

## 2020-05-12 DIAGNOSIS — J690 Pneumonitis due to inhalation of food and vomit: Secondary | ICD-10-CM

## 2020-05-12 DIAGNOSIS — I4819 Other persistent atrial fibrillation: Secondary | ICD-10-CM

## 2020-05-12 DIAGNOSIS — I214 Non-ST elevation (NSTEMI) myocardial infarction: Secondary | ICD-10-CM | POA: Diagnosis not present

## 2020-05-12 DIAGNOSIS — I248 Other forms of acute ischemic heart disease: Secondary | ICD-10-CM | POA: Insufficient documentation

## 2020-05-12 DIAGNOSIS — K7460 Unspecified cirrhosis of liver: Secondary | ICD-10-CM | POA: Diagnosis not present

## 2020-05-12 DIAGNOSIS — Z950 Presence of cardiac pacemaker: Secondary | ICD-10-CM

## 2020-05-12 DIAGNOSIS — R570 Cardiogenic shock: Secondary | ICD-10-CM | POA: Diagnosis not present

## 2020-05-12 DIAGNOSIS — I442 Atrioventricular block, complete: Secondary | ICD-10-CM | POA: Diagnosis not present

## 2020-05-12 DIAGNOSIS — I5043 Acute on chronic combined systolic (congestive) and diastolic (congestive) heart failure: Secondary | ICD-10-CM | POA: Diagnosis not present

## 2020-05-12 LAB — HEPATIC FUNCTION PANEL
ALT: 21 U/L (ref 0–44)
AST: 45 U/L — ABNORMAL HIGH (ref 15–41)
Albumin: 2.9 g/dL — ABNORMAL LOW (ref 3.5–5.0)
Alkaline Phosphatase: 90 U/L (ref 38–126)
Bilirubin, Direct: 1.7 mg/dL — ABNORMAL HIGH (ref 0.0–0.2)
Indirect Bilirubin: 4.4 mg/dL — ABNORMAL HIGH (ref 0.3–0.9)
Total Bilirubin: 6.1 mg/dL — ABNORMAL HIGH (ref 0.3–1.2)
Total Protein: 6 g/dL — ABNORMAL LOW (ref 6.5–8.1)

## 2020-05-12 LAB — BPAM PLATELET PHERESIS
Blood Product Expiration Date: 202203142359
ISSUE DATE / TIME: 202203141558
Unit Type and Rh: 6200

## 2020-05-12 LAB — PREPARE PLATELET PHERESIS: Unit division: 0

## 2020-05-12 LAB — BASIC METABOLIC PANEL
Anion gap: 8 (ref 5–15)
BUN: 37 mg/dL — ABNORMAL HIGH (ref 8–23)
CO2: 27 mmol/L (ref 22–32)
Calcium: 8.9 mg/dL (ref 8.9–10.3)
Chloride: 100 mmol/L (ref 98–111)
Creatinine, Ser: 1.83 mg/dL — ABNORMAL HIGH (ref 0.44–1.00)
GFR, Estimated: 29 mL/min — ABNORMAL LOW (ref >60)
Glucose, Bld: 91 mg/dL (ref 70–99)
Potassium: 4.1 mmol/L (ref 3.5–5.1)
Sodium: 135 mmol/L (ref 135–145)

## 2020-05-12 LAB — PROTIME-INR
INR: 2.2 — ABNORMAL HIGH (ref 0.8–1.2)
Prothrombin Time: 24 seconds — ABNORMAL HIGH (ref 11.4–15.2)

## 2020-05-12 LAB — CBC
HCT: 27.2 % — ABNORMAL LOW (ref 36.0–46.0)
Hemoglobin: 9 g/dL — ABNORMAL LOW (ref 12.0–15.0)
MCH: 31.3 pg (ref 26.0–34.0)
MCHC: 33.1 g/dL (ref 30.0–36.0)
MCV: 94.4 fL (ref 80.0–100.0)
Platelets: 43 10*3/uL — ABNORMAL LOW (ref 150–400)
RBC: 2.88 MIL/uL — ABNORMAL LOW (ref 3.87–5.11)
RDW: 17.4 % — ABNORMAL HIGH (ref 11.5–15.5)
WBC: 4 10*3/uL (ref 4.0–10.5)
nRBC: 0 % (ref 0.0–0.2)

## 2020-05-12 LAB — PROCALCITONIN: Procalcitonin: <0.1 ng/mL

## 2020-05-12 MED ORDER — METOPROLOL TARTRATE 12.5 MG HALF TABLET
12.5000 mg | ORAL_TABLET | Freq: Two times a day (BID) | ORAL | Status: DC
  Administered 2020-05-12 – 2020-05-14 (×5): 12.5 mg via ORAL
  Filled 2020-05-12 (×6): qty 1

## 2020-05-12 MED FILL — Midazolam HCl Inj 5 MG/5ML (Base Equivalent): INTRAMUSCULAR | Qty: 5 | Status: AC

## 2020-05-12 MED FILL — Cefazolin Sodium-Dextrose IV Solution 2 GM/100ML-4%: INTRAVENOUS | Qty: 100 | Status: AC

## 2020-05-12 MED FILL — Fentanyl Citrate Preservative Free (PF) Inj 100 MCG/2ML: INTRAMUSCULAR | Qty: 2 | Status: AC

## 2020-05-12 MED FILL — Lidocaine HCl Local Inj 1%: INTRAMUSCULAR | Qty: 45 | Status: AC

## 2020-05-12 NOTE — Discharge Instructions (Signed)
    Supplemental Discharge Instructions for  Pacemaker/Defibrillator Patients  Activity No heavy lifting or vigorous activity with your left/right arm for 6 to 8 weeks.  Do not raise your left/right arm above your head for one week.  Gradually raise your affected arm as drawn below.              05/21/20                    05/22/20                   05/23/20                   05/24/20 __  NO DRIVING patient reports she does not drive.  WOUND CARE - Keep the wound area clean and dry.  Do not get this area wet , no showers until cleared to at your wound check visit  - The tape/steri-strips on your wound will fall off; do not pull them off.  No bandage is needed on the site.  DO  NOT apply any creams, oils, or ointments to the wound area. - If you notice any drainage or discharge from the wound, any swelling or bruising at the site, or you develop a fever > 101? F after you are discharged home, call the office at once.  Special Instructions - You are still able to use cellular telephones; use the ear opposite the side where you have your pacemaker/defibrillator.  Avoid carrying your cellular phone near your device. - When traveling through airports, show security personnel your identification card to avoid being screened in the metal detectors.  Ask the security personnel to use the hand wand. - Avoid arc welding equipment, MRI testing (magnetic resonance imaging), TENS units (transcutaneous nerve stimulators).  Call the office for questions about other devices. - Avoid electrical appliances that are in poor condition or are not properly grounded. - Microwave ovens are safe to be near or to operate.

## 2020-05-12 NOTE — Progress Notes (Addendum)
Progress Note  Patient Name: Samantha [name] Date of Encounter: 05/12/2020  [facility] Cardiologist: Sherren Mocha, MD   Subjective   Aching at [facility] site, no CP, denies SOB, eating breakfast with enthusiasm  Inpatient Medications    Scheduled Meds:  Chlorhexidine Gluconate Cloth  6 each Topical Daily   ezetimibe  10 mg Oral QHS   guaiFENesin  600 mg Oral BID   lidocaine  1 patch Transdermal Q24H   methocarbamol  500 mg Oral TID   pantoprazole  40 mg Oral Daily   senna-docusate  2 tablet Oral QHS   sodium chloride flush  10-40 mL Intracatheter Q12H   sodium chloride flush  3 mL Intravenous Q12H   sodium chloride flush  3 mL Intravenous Q12H   Continuous Infusions:  sodium chloride     sodium chloride 999 mL/hr at 05/11/20 1646   albumin human Stopped (05/12/20 0506)   ampicillin-sulbactam (UNASYN) IV Stopped (05/12/20 0034)   DOPamine Stopped (05/11/20 1438)   PRN Meds: sodium chloride, sodium chloride, acetaminophen, albuterol, ipratropium, oxyCODONE, pneumococcal 23 valent vaccine, sodium chloride flush, sodium chloride flush, sodium chloride flush   Vital Signs    Vitals:   05/12/20 0500 05/12/20 0530 05/12/20 0600 05/12/20 0800  BP: (!) 99/44 (!) 121/50 (!) 129/47 (!) 108/52  Pulse: 99 97 (!) 111 100  Resp: 14 12 15 18   Temp:      TempSrc:      SpO2: 98% 97% 98% 97%  Weight: 79.6 kg     Height:        Intake/Output Summary (Last 24 hours) at 05/12/2020 0821 Last data filed at 05/12/2020 0800 Gross per 24 hour  Intake 1320.54 ml  Output 800 ml  Net 520.54 ml   Last 3 Weights 05/12/2020 05/11/2020 05/10/2020  Weight (lbs) 175 lb 7.8 oz 175 lb 14.8 oz 168 lb 14 oz  Weight (kg) 79.6 kg 79.8 kg 76.6 kg      Telemetry    SR, ST 90's-100's, frequent PACs - Personally Reviewed  ECG    No new EKG - Personally Reviewed  Physical Exam   GEN: No acute distress, appears chronically ill, though more animated then yesterday.   Neck: No  JVD Cardiac: RRR, 1/6 SM, no murmurs, rubs, or gallops.  Respiratory: CTA b/l. GI: Soft, nontender, non-distended  MS: No edema; No deformity. Neuro:  Nonfocal  Psych: Normal affect   Labs    High Sensitivity Troponin:   Recent Labs  Lab 04/12/20 1411 04/12/20 1527 04/12/20 2118 05/09/20 1307 05/09/20 1506  TROPONINIHS 27* 28* 29* 81* 86*      Chemistry Recent Labs  Lab 05/09/20 1307 05/09/20 2039 05/09/20 2041 05/10/20 0607 05/11/20 0219 05/12/20 0401  NA 139 134*   < > 140 136 135  K 4.3 4.6   < > 4.9 4.4 4.1  CL 100 98  --  103 99 100  CO2 23 24  --  26 27 27   GLUCOSE 111* 161*  --  111* 106* 91  BUN 36* 36*  --  37* 37* 37*  CREATININE 2.97* 3.01*  --  2.79* 2.30* 1.83*  CALCIUM 9.1 9.3  --  9.3 9.3 8.9  PROT 6.7 7.1  --   --   --  6.0*  ALBUMIN 2.6* 2.7*  --   --   --  2.9*  AST 52* 54*  --   --   --  45*  ALT 24 24  --   --   --  21  ALKPHOS 136* 136*  --   --   --  90  BILITOT 7.3* 9.3*  --   --   --  6.1*  GFRNONAA 16* 16*  --  18* 22* 29*  ANIONGAP 16* 12  --  11 10 8    < > = values in this interval not displayed.     Hematology Recent Labs  Lab 05/09/20 2039 05/09/20 2041 05/10/20 0102 05/11/20 0219 05/12/20 0401  WBC 9.4  --   --  5.2 4.0  RBC 3.94  --   --  3.46* 2.88*  HGB 12.0   < > 12.6 10.5* 9.0*  HCT 37.2   < > 37.0 32.7* 27.2*  MCV 94.4  --   --  94.5 94.4  MCH 30.5  --   --  30.3 31.3  MCHC 32.3  --   --  32.1 33.1  RDW 18.0*  --   --  17.5* 17.4*  PLT 89*  --   --  50* 43*   < > = values in this interval not displayed.    BNP Recent Labs  Lab 05/09/20 1307  BNP 488.0*     DDimer No results for input(s): DDIMER in the last 168 hours.   Radiology    DG Chest 2 View Result Date: 05/12/2020 CLINICAL DATA:  Post pacer placement EXAM: CHEST - 2 VIEW COMPARISON:  05/11/2020 FINDINGS: Interval placement of left chest wall dual lead pacemaker. Leads overlie the right atrium and right ventricle. No pneumothorax. Removal of  temporary pacemaker with right IJ introducer remaining present. Similar mild pulmonary edema and cardiomegaly. IMPRESSION: Post pacemaker placement without complication. Similar mild pulmonary edema and cardiomegaly. Electronically Signed   By: Macy Mis M.D.   On: 05/12/2020 08:15   CT CHEST WO CONTRAST  Result Date: 05/10/2020 CLINICAL DATA:  History of pneumonia by report. Multifocal opacities on prior imaging. EXAM: CT CHEST WITHOUT CONTRAST TECHNIQUE: Multidetector CT imaging of the chest was performed following the standard protocol without IV contrast. COMPARISON:  May 17, 2019. FINDINGS: Cardiovascular: RIGHT-sided transvenous pacer terminates in the pulmonary artery outflow tract in the proximal main pulmonary artery. Calcified atheromatous plaque of the thoracic aorta with normal caliber. Three-vessel coronary artery disease. Heart size is enlarged without pericardial effusion. Central pulmonary vasculature engorged unchanged from prior imaging. Limited assessment of cardiovascular structures given lack of intravenous contrast. Mediastinum/Nodes: No adenopathy in the mediastinum. Patulous esophagus with debris in the lumen. Scattered lymph nodes throughout the chest mildly prominent without pathologic enlargement. Lungs/Pleura: Patchy areas of airspace opacity throughout the chest. Nodule in the RIGHT lung base (image 114, series 8. Mainly ground-glass. 13 mm nodular ground-glass focus in the RIGHT lung base on image 103. Scattered ground-glass nodules essentially throughout the chest slightly more numerous in the mid chest though distributed in the upper lobes as well. Septal thickening. Basilar atelectasis. Upper Abdomen: Lobular hepatic contours. Cholelithiasis without pericholecystic stranding. Signs of portosystemic collateral suggested in the upper abdomen not well evaluated. Musculoskeletal: Cement augmentation of the L1 vertebral level seen on prior imaging. Osteopenia.  Signs of  sternotomy. Worsening loss of height about the L2. Approximately 40-50% loss of height at L2, progressed from previous imaging where there was less than 40% loss of height. Superior endplate of L3 also with signs of fracture, likely new. L3 incompletely imaged. IMPRESSION: 1. Patchy areas of airspace opacity throughout the chest with basilar atelectasis. Findings could be seen in the setting of multifocal pneumonia, likely viral  or atypical process. Given the patulous debris-filled esophagus would also entertained the possibility of a component of aspiration pneumonitis though findings are distributed throughout the lungs rather than at the basilar portions of the chest. 2. Transvenous pacer device is in the pulmonary artery outflow tract. Correlate with pacer function and consider repositioning as warranted. 3. Worsening of lumbar spine compression fractures at L2 and new compression fracture at L3. Loss of height is greatest at L2 as described with prior cement augmentation of L1. L3 is incompletely imaged. 4. Three-vessel coronary artery disease. 5. Patulous esophagus with debris in the lumen this may reflect reflux or esophageal dysmotility. 6. Signs of liver disease. 7. Cholelithiasis. 8. Aortic atherosclerosis. These results will be called to the ordering clinician or representative by the Radiologist Assistant, and communication documented in the PACS or [employer]. Aortic Atherosclerosis (ICD10-I70.0). Electronically Signed   By: Zetta Bills M.D.   On: 05/10/2020 18:23   DG Chest Port 1 View  Result Date: 05/11/2020 CLINICAL DATA:  Congestive heart failure and heart block. EXAM: PORTABLE CHEST 1 VIEW COMPARISON:  05/10/2020 FINDINGS: Stable cardiac enlargement. Right jugular transvenous pacemaker demonstrates stable position with the tip near the expected region of the pulmonary outflow tract. Pulmonary edema pattern improved since the prior chest x-ray. No pleural effusions or pneumothorax.  IMPRESSION: Improving pulmonary edema. Stable cardiomegaly. Electronically Signed   By: Aletta Edouard M.D.   On: 05/11/2020 08:10   DG CHEST PORT 1 VIEW  Result Date: 05/10/2020 CLINICAL DATA:  Shortness of breath and chest pain EXAM: PORTABLE CHEST 1 VIEW COMPARISON:  February 17, 2020 FINDINGS: There is cardiomegaly with pulmonary vascularity within normal limits. Patient is status post coronary artery bypass grafting. Apparent pacemaker lead in right atrial region. There is ill-defined airspace opacity in each upper lobe and left base region. No consolidation. No appreciable adenopathy. There is aortic atherosclerosis. No bone lesions. IMPRESSION: Ill-defined airspace opacity in each upper lobe and left base regions. Appearance felt to be indicative of multifocal pneumonia. Question atypical organism pneumonia. Check of COVID-19 status advised. Cardiomegaly. Status post coronary artery bypass grafting. Apparent pacemaker via trans jugular approach with lead attached to right heart, likely right atrium. Aortic Atherosclerosis (ICD10-I70.0). Electronically Signed   By: Lowella Grip III M.D.   On: 05/10/2020 10:53    Cardiac Studies    05/09/20" TTE IMPRESSIONS  1. Left ventricular ejection fraction, by estimation, is 35 to 40%. The  left ventricle has moderately decreased function. The left ventricle  demonstrates global hypokinesis and RWMA as noted in findings. There is  mild left ventricular hypertrophy. Left  ventricular diastolic parameters are indeterminate.  2. Respiratory related septal shift.  3. Right ventricular systolic function is mildly reduced. The right  ventricular size is normal. There is moderately elevated pulmonary artery  systolic pressure. The estimated right ventricular systolic pressure is  00.9 mmHg.  4. Left atrial size was moderately dilated.  5. Right atrial size was moderately dilated.  6. Diastolic and systolic mitral valve regurgitation.. The  mitral valve  is grossly normal. Mild to moderate mitral valve regurgitation.  7. Tricuspid valve regurgitation is moderate.  8. The aortic valve is grossly normal. There is mild calcification of the  aortic valve. Aortic valve regurgitation is not visualized. No aortic  stenosis is present.  9. The inferior vena cava is dilated in size with <50% respiratory  variability, suggesting right atrial pressure of 15 mmHg.   Patient Profile     70 y.o.  female  with a h/o cirrhosis, thrombocytopenia, atrial fibrillation, CRI, CAD s/p CABG 2001,and prior bradycardia who is admitted with symptomatic complete heart block. EF 35-40% chronically.  Assessment & Plan    1. Complete heart block The patient presents quite ill with lactic acidosis, confusion, and renal failure secondary to prolonged complete heart block. She is better s/p temp pacing wire placement >> PPM implant yesterday  Device check this AM with stable measurements CXR without ptx Site looks great, when taking pressure dressing off, tegaderm was attached and taken off as well Wound care and activity restrictions were reviewed with the patient Routine post PPM follow up is in place   2. Cardiogenic shock Felt 2/22 CHB Off dopamine yesterday Lactic acid was better, procalictonin <37 Afebrile Wbc is wnl  3. CAD s/p CABG and PCI Looks like last cath was 2018 Stable No anginal complaints   4. afib Not a candidate for [place] due to cirrhosis, GIBs, and low plts.  5. Back pain     Not new, known compression fracture from last hospital stay last month     L2 compression fracture with ambulatory dysfunction--- PT recommends SNF -MRIof Lumbar Spine--- MRI findings indicates that pt is Nota candidate for kyphoplasty/vertebroplasty (Mild L1 and L2 compression fractures are new since December 16---does not appear to be very acute)     This was described as a mechanical trip/fall      On home pain med regime      ,management with IM and attending cardiologist  6. cirrhosis     Non-alcoholic     [place] for procedure today     Hgb trending down      Admitting 11.7 > 12.9 > 12.6 > 10.5 > 9.0 today     no overt bleeding or signs of bleeding, no BM in a couple days     D/w attending, will defer to him   7. COPD      On home O2  8. Chronic CHF 9. ICM     Volume status appears stable this AM, CXR with stable findings     LVEF 35-40%     BP has limited GDMT   Dr. Quentin Ore has seen and examined the patient this morning EP will sign off though remain available, please recall if needed  For questions or updates, please contact [place] Please consult [email or web address] for contact info under        Signed, Baldwin [place], PA-C  05/12/2020, 8:21 AM

## 2020-05-12 NOTE — Progress Notes (Addendum)
Progress Note  Patient Name: Samantha [name] Date of Encounter: 05/12/2020  [facility] Cardiologist: Sherren Mocha, MD   Subjective   Overnight was weaned off of inotropes and s/p DC PPM:  Concern about long term Care was the reason to not start with CRT-P.  Patient eating well this morning with no SOB and improved energy.  Inpatient Medications    Scheduled Meds: . Chlorhexidine Gluconate Cloth  6 each Topical Daily  . ezetimibe  10 mg Oral QHS  . guaiFENesin  600 mg Oral BID  . lidocaine  1 patch Transdermal Q24H  . methocarbamol  500 mg Oral TID  . metoprolol tartrate  12.5 mg Oral BID  . pantoprazole  40 mg Oral Daily  . senna-docusate  2 tablet Oral QHS  . sodium chloride flush  10-40 mL Intracatheter Q12H  . sodium chloride flush  3 mL Intravenous Q12H  . sodium chloride flush  3 mL Intravenous Q12H   Continuous Infusions: . sodium chloride    . sodium chloride 999 mL/hr at 05/11/20 1646  . albumin human 12.5 g (05/12/20 1007)  . ampicillin-sulbactam (UNASYN) IV 3 g (05/12/20 1101)  . DOPamine Stopped (05/11/20 1438)   PRN Meds: sodium chloride, sodium chloride, acetaminophen, albuterol, ipratropium, oxyCODONE, pneumococcal 23 valent vaccine, sodium chloride flush, sodium chloride flush, sodium chloride flush   Vital Signs    Vitals:   05/12/20 0951 05/12/20 1000 05/12/20 1100 05/12/20 1200  BP:  119/73 (!) 110/55 (!) 113/100  Pulse: (!) 117 (!) 121 99 93  Resp:  20 (!) 23 19  Temp:      TempSrc:      SpO2: 100% 99% 100% 100%  Weight:      Height:        Intake/Output Summary (Last 24 hours) at 05/12/2020 1217 Last data filed at 05/12/2020 0900 Gross per 24 hour  Intake 1609.14 ml  Output 800 ml  Net 809.14 ml   Last 3 Weights 05/12/2020 05/11/2020 05/10/2020  Weight (lbs) 175 lb 7.8 oz 175 lb 14.8 oz 168 lb 14 oz  Weight (kg) 79.6 kg 79.8 kg 76.6 kg      Telemetry    A fib with PVCs - Personally Reviewed  ECG    No new tracing last 24 hours  - Personally Reviewed  Physical Exam   GEN: Chronically ill appearing, comfortable and NAD Neck: TVP site c/d/i Cardiac: RRR, II/VI holosystolic murmur without rubs, or gallops  Respiratory: Poor air movement in bases, no crackles on exam  GI: Soft, nontender, non-distended  MS: Trace pedal edema; warm; toe nails painted red Neuro:  Nonfocal  Psych: Normal affect  SKIN:  No pocked hematoma  Labs    High Sensitivity Troponin:   Recent Labs  Lab 04/12/20 1411 04/12/20 1527 04/12/20 2118 05/09/20 1307 05/09/20 1506  TROPONINIHS 27* 28* 29* 81* 86*      Chemistry Recent Labs  Lab 05/09/20 1307 05/09/20 2039 05/09/20 2041 05/10/20 0607 05/11/20 0219 05/12/20 0401  NA 139 134*   < > 140 136 135  K 4.3 4.6   < > 4.9 4.4 4.1  CL 100 98  --  103 99 100  CO2 23 24  --  26 27 27   GLUCOSE 111* 161*  --  111* 106* 91  BUN 36* 36*  --  37* 37* 37*  CREATININE 2.97* 3.01*  --  2.79* 2.30* 1.83*  CALCIUM 9.1 9.3  --  9.3 9.3 8.9  PROT 6.7  7.1  --   --   --  6.0*  ALBUMIN 2.6* 2.7*  --   --   --  2.9*  AST 52* 54*  --   --   --  45*  ALT 24 24  --   --   --  21  ALKPHOS 136* 136*  --   --   --  90  BILITOT 7.3* 9.3*  --   --   --  6.1*  GFRNONAA 16* 16*  --  18* 22* 29*  ANIONGAP 16* 12  --  11 10 8    < > = values in this interval not displayed.     Hematology Recent Labs  Lab 05/09/20 2039 05/09/20 2041 05/10/20 0102 05/11/20 0219 05/12/20 0401  WBC 9.4  --   --  5.2 4.0  RBC 3.94  --   --  3.46* 2.88*  HGB 12.0   < > 12.6 10.5* 9.0*  HCT 37.2   < > 37.0 32.7* 27.2*  MCV 94.4  --   --  94.5 94.4  MCH 30.5  --   --  30.3 31.3  MCHC 32.3  --   --  32.1 33.1  RDW 18.0*  --   --  17.5* 17.4*  PLT 89*  --   --  50* 43*   < > = values in this interval not displayed.    BNP Recent Labs  Lab 05/09/20 1307  BNP 488.0*     DDimer No results for input(s): DDIMER in the last 168 hours.   Radiology    DG Chest 2 View  Result Date: 05/12/2020 CLINICAL DATA:   Post pacer placement EXAM: CHEST - 2 VIEW COMPARISON:  05/11/2020 FINDINGS: Interval placement of left chest wall dual lead pacemaker. Leads overlie the right atrium and right ventricle. No pneumothorax. Removal of temporary pacemaker with right IJ introducer remaining present. Similar mild pulmonary edema and cardiomegaly. IMPRESSION: Post pacemaker placement without complication. Similar mild pulmonary edema and cardiomegaly. Electronically Signed   By: Macy Mis M.D.   On: 05/12/2020 08:15   CT CHEST WO CONTRAST  Result Date: 05/10/2020 CLINICAL DATA:  History of pneumonia by report. Multifocal opacities on prior imaging. EXAM: CT CHEST WITHOUT CONTRAST TECHNIQUE: Multidetector CT imaging of the chest was performed following the standard protocol without IV contrast. COMPARISON:  May 17, 2019. FINDINGS: Cardiovascular: RIGHT-sided transvenous pacer terminates in the pulmonary artery outflow tract in the proximal main pulmonary artery. Calcified atheromatous plaque of the thoracic aorta with normal caliber. Three-vessel coronary artery disease. Heart size is enlarged without pericardial effusion. Central pulmonary vasculature engorged unchanged from prior imaging. Limited assessment of cardiovascular structures given lack of intravenous contrast. Mediastinum/Nodes: No adenopathy in the mediastinum. Patulous esophagus with debris in the lumen. Scattered lymph nodes throughout the chest mildly prominent without pathologic enlargement. Lungs/Pleura: Patchy areas of airspace opacity throughout the chest. Nodule in the RIGHT lung base (image 114, series 8. Mainly ground-glass. 13 mm nodular ground-glass focus in the RIGHT lung base on image 103. Scattered ground-glass nodules essentially throughout the chest slightly more numerous in the mid chest though distributed in the upper lobes as well. Septal thickening. Basilar atelectasis. Upper Abdomen: Lobular hepatic contours. Cholelithiasis without  pericholecystic stranding. Signs of portosystemic collateral suggested in the upper abdomen not well evaluated. Musculoskeletal: Cement augmentation of the L1 vertebral level seen on prior imaging. Osteopenia.  Signs of sternotomy. Worsening loss of height about the L2. Approximately 40-50% loss of height at  L2, progressed from previous imaging where there was less than 40% loss of height. Superior endplate of L3 also with signs of fracture, likely new. L3 incompletely imaged. IMPRESSION: 1. Patchy areas of airspace opacity throughout the chest with basilar atelectasis. Findings could be seen in the setting of multifocal pneumonia, likely viral or atypical process. Given the patulous debris-filled esophagus would also entertained the possibility of a component of aspiration pneumonitis though findings are distributed throughout the lungs rather than at the basilar portions of the chest. 2. Transvenous pacer device is in the pulmonary artery outflow tract. Correlate with pacer function and consider repositioning as warranted. 3. Worsening of lumbar spine compression fractures at L2 and new compression fracture at L3. Loss of height is greatest at L2 as described with prior cement augmentation of L1. L3 is incompletely imaged. 4. Three-vessel coronary artery disease. 5. Patulous esophagus with debris in the lumen this may reflect reflux or esophageal dysmotility. 6. Signs of liver disease. 7. Cholelithiasis. 8. Aortic atherosclerosis. These results will be called to the ordering clinician or representative by the Radiologist Assistant, and communication documented in the PACS or [employer]. Aortic Atherosclerosis (ICD10-I70.0). Electronically Signed   By: Zetta Bills M.D.   On: 05/10/2020 18:23   DG Chest Port 1 View  Result Date: 05/11/2020 CLINICAL DATA:  Congestive heart failure and heart block. EXAM: PORTABLE CHEST 1 VIEW COMPARISON:  05/10/2020 FINDINGS: Stable cardiac enlargement. Right jugular  transvenous pacemaker demonstrates stable position with the tip near the expected region of the pulmonary outflow tract. Pulmonary edema pattern improved since the prior chest x-ray. No pleural effusions or pneumothorax. IMPRESSION: Improving pulmonary edema. Stable cardiomegaly. Electronically Signed   By: Aletta Edouard M.D.   On: 05/11/2020 08:10    Cardiac Studies   Echo 01/24/20: 1. Left ventricular ejection fraction, by estimation, is 35 to 40%. The  left ventricle has moderately decreased function. The left ventricle  demonstrates global hypokinesis. Left ventricular diastolic function could  not be evaluated. There is severe  hypokinesis of the left ventricular, entire inferior wall and  inferolateral wall.  2. Right ventricular systolic function is mildly reduced. The right  ventricular size is normal. There is mildly elevated pulmonary artery  systolic pressure.  3. Left atrial size was severely dilated.  4. Right atrial size was severely dilated.  5. The mitral valve is normal in structure. Mild to moderate mitral valve  regurgitation.  6. Tricuspid valve regurgitation is moderate.  7. The aortic valve is tricuspid. Aortic valve regurgitation is not  visualized. Mild aortic valve sclerosis is present, with no evidence of  aortic valve stenosis.  8. The inferior vena cava is normal in size with <50% respiratory  variability, suggesting right atrial pressure of 8 mmHg.   Patient Profile     70 y.o. female with history of multivessel CAD status post CABG in 2001 with subsequent PCIs, nonalcoholic cirrhosis with esophageal varices and thrombocytopenia, heart failure with reduced ejection fraction as low as 20 to 35%, HTN, HLD, Afib not on AC due to cirrhosis and varices, SSS, COPD on home O2 who presented to [facility] with an episode of unresponsiveness found to be profoundly bradycardic in the field initially responsive to atropine. On arrival to [facility], reportedly  had episode of accelerated idioventricular rhythm-->10s pause in which CPR was almost initiated and then she developed a bradycardic rhythm consistent with CHB. Subsequently transferred to [facility] now s/p TVP placement.  Assessment & Plan    #Complete  Heart Block: #Accelerated Idioventricular Rhythm: -EP consulted, placed DC PPM; has presently signed off  #Cardiogenic Shock #FrEF 35% # Mitral Regurgitation (mild to moderate) # AKI on CKI - Secondary to complete heart block as above.  -TTE with stable LVEF 43-27%, diastolic and systolic MR and TR  - given improvement in kidney function will continue to hold  hold torsemide with improvement; will need discharge diuretic - holding MRA  #Multivessel CAD s/p CABG in 2001 with subsequent PCI: # HLD -NO ASA or Plavix with given thrombocytopenia and cirrhosis.  -GDMT limited due to cirrhosis and thrombocytopenia  -Continue zetia 6m daily -Coronary angiography deferred yesterday due to AKI and patient high risk for DAPT--can reassess once more - clinically stable  #Paroxysmal Afib: Not on AC due to high risk of bleed with cirrhosis and thrombocytopenia. -No AC - started low dose BB  #Non-alcoholic cirrhosis: Complicated by varices and thrombocytopenia. -Trend plt -No AC or DAPT at this time - worsened post procedure but s/p plateets  PNA- Continuing 5 day Unasyn course  #COPD on home O2: -Continue supplemental O2 as needed -Inhalers prn  Full Code- Given overall poor prognosis, we have consulted Palliative Care for assessment in long term goals of care Cardiac Diet Labs ordered SCDs for DVT prophylaxis Planned to transfer to floor today  For questions or updates, please contact [facility] Please consult [email or web address] for contact info under        Signed, MWerner Lean MD  05/12/2020, 12:17 PM

## 2020-05-12 NOTE — Progress Notes (Addendum)
Patient ID: Samantha [name], female   DOB: 05-01-1950, 70 y.o.   MRN: [id number]  PROGRESS NOTE    Samantha [name]  SPQ:[id number] DOB: [birth year] DOA: 05/09/2020 PCP: [name], MD   Brief Narrative:  70 year old female with history of CAD status post CABG x3, chronic systolic CHF with LVEF of 35 to 40% in January 2021, NASH with cirrhosis and portal hypertension, PAF, hypertension, L1-L2 compression fracture in February 2022, chronic hypoxic respiratory failure on home oxygen as needed presented with multiple falls, unresponsiveness and symptomatic bradycardia with heart rates in the 30s-40s.  She was transferred from [facility] to [facility] under cardiology care.  She was started on dopamine drip and underwent transvenous pacemaker.  TRH service was consulted on 05/10/2020 for determination of CHF versus pneumonia.  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  Underwent permanent pacemaker placement on 05/11/2020  Cardiogenic shock Acute on chronic systolic heart failure Mild to moderate moderate mitral regurgitation Multivessel CAD status post CABG in 2001 with subsequent PCI Hyperlipidemia Paroxysmal A. fib, not on anticoagulation -Management as per primary cardiology team.  Currently off dopamine drip.  Not on diuretics currently. -Overall prognosis is guarded to poor.  Recommend palliative care consultation if primary team agrees.  Question of pneumonia -CT of the chest showed patchy areas of airspace opacities suggestive of multifocal pneumonia, viral or atypical; cannot rule out aspiration pneumonia -Currently on room air.  Patient has cough with productive sputum.  Unclear if this is bacterial pneumonia versus aspiration pneumonia versus just fluid overload.  Procalcitonin of less than 0.1 is suggestive that this might not be bacterial pneumonia -Continue Unasyn for total of 5 days.  Acute kidney injury on chronic kidney disease stage  IIIa -Probably from cardiorenal syndrome.  Creatinine improving.  Monitor  NASH with cirrhosis of liver and portal hypertension with elevated LFTs -LFTs are improving.  Patient follows up with GI as an outpatient.  Thrombocytopenia -Probably from above.  Worsening.  Monitor.  COPD Chronic hypoxic respiratory failure failure on supplemental oxygen as needed -Currently on room air.   Subjective: Patient seen and examined at bedside.  Poor historian.  No overnight fever, chest pain or abdominal pain reported.  Still short of breath with exertion with intermittent cough and sputum production.  Objective: Vitals:   05/12/20 0430 05/12/20 0500 05/12/20 0530 05/12/20 0600  BP: (!) 125/106 (!) 99/44 (!) 121/50 (!) 129/47  Pulse: (!) 102 99 97 (!) 111  Resp: 12 14 12 15   Temp:      TempSrc:      SpO2: 98% 98% 97% 98%  Weight:  79.6 kg    Height:        Intake/Output Summary (Last 24 hours) at 05/12/2020 0716 Last data filed at 05/12/2020 0000 Gross per 24 hour  Intake 1069.83 ml  Output 550 ml  Net 519.83 ml   Filed Weights   05/10/20 0430 05/11/20 0500 05/12/20 0500  Weight: 76.6 kg 79.8 kg 79.6 kg    Examination:  General exam:  Elderly female lying in bed.  No acute distress.  On 2 L/min nasal cannula currently.  Looks chronically ill and deconditioned.  Poor historian. Respiratory system: Decreased breath sounds at bases with basilar crackles Cardiovascular system: Tachycardic, S1-S2 heard gastrointestinal system: Abdomen is slightly distended, soft and nontender.  Bowel sounds are heard  extremities: Mild bilateral lower extremity edema present; no clubbing  Central nervous system: Still very slow to  respond to questions; alert no focal neurological deficits.  Moves extremities Skin: No obvious petechiae/lesions.  Eyes look icteric psychiatry: Affect is flat    Data Reviewed: I have personally reviewed following labs and imaging studies  CBC: Recent Labs  Lab  05/09/20 1307 05/09/20 2039 05/09/20 2041 05/10/20 0102 05/11/20 0219 05/12/20 0401  WBC 5.5 9.4  --   --  5.2 4.0  NEUTROABS 3.9  --   --   --   --   --   HGB 11.7* 12.0 12.9 12.6 10.5* 9.0*  HCT 37.0 37.2 38.0 37.0 32.7* 27.2*  MCV 96.6 94.4  --   --  94.5 94.4  PLT 67* 89*  --   --  50* 43*   Basic Metabolic Panel: Recent Labs  Lab 05/09/20 1307 05/09/20 2039 05/09/20 2041 05/10/20 0102 05/10/20 0607 05/11/20 0219 05/12/20 0401  NA 139 134* 140 140 140 136 135  K 4.3 4.6 4.7 4.7 4.9 4.4 4.1  CL 100 98  --   --  103 99 100  CO2 23 24  --   --  26 27 27   GLUCOSE 111* 161*  --   --  111* 106* 91  BUN 36* 36*  --   --  37* 37* 37*  CREATININE 2.97* 3.01*  --   --  2.79* 2.30* 1.83*  CALCIUM 9.1 9.3  --   --  9.3 9.3 8.9  MG 1.9  --   --   --   --   --   --    GFR: Estimated Creatinine Clearance: 27.6 mL/min (A) (by C-G formula based on SCr of 1.83 mg/dL (H)). Liver Function Tests: Recent Labs  Lab 05/09/20 1307 05/09/20 2039 05/12/20 0401  AST 52* 54* 45*  ALT 24 24 21   ALKPHOS 136* 136* 90  BILITOT 7.3* 9.3* 6.1*  PROT 6.7 7.1 6.0*  ALBUMIN 2.6* 2.7* 2.9*   No results for input(s): LIPASE, AMYLASE in the last 168 hours. No results for input(s): AMMONIA in the last 168 hours. Coagulation Profile: Recent Labs  Lab 05/10/20 1642 05/11/20 0219 05/12/20 0401  INR 2.1* 2.2* 2.2*   Cardiac Enzymes: No results for input(s): CKTOTAL, CKMB, CKMBINDEX, TROPONINI in the last 168 hours. BNP (last 3 results) No results for input(s): PROBNP in the last 8760 hours. HbA1C: No results for input(s): HGBA1C in the last 72 hours. CBG: No results for input(s): GLUCAP in the last 168 hours. Lipid Profile: No results for input(s): CHOL, HDL, LDLCALC, TRIG, CHOLHDL, LDLDIRECT in the last 72 hours. Thyroid Function Tests: No results for input(s): TSH, T4TOTAL, FREET4, T3FREE, THYROIDAB in the last 72 hours. Anemia Panel: No results for input(s): VITAMINB12, FOLATE,  FERRITIN, TIBC, IRON, RETICCTPCT in the last 72 hours. Sepsis Labs: Recent Labs  Lab 05/09/20 1506 05/09/20 2039 05/10/20 0100 05/10/20 0607 05/10/20 1642 05/11/20 0219 05/12/20 0401  PROCALCITON  --   --   --   --  0.10 <0.10 <0.10  LATICACIDVEN 6.9* 3.8* 2.3* 2.4*  --   --   --     Recent Results (from the past 240 hour(s))  Resp Panel by RT-PCR (Flu A&B, Covid) Nasopharyngeal Swab     Status: None   Collection Time: 05/09/20  1:05 PM   Specimen: Nasopharyngeal Swab; Nasopharyngeal(NP) swabs in vial transport medium  Result Value Ref Range Status   SARS Coronavirus 2 by RT PCR NEGATIVE NEGATIVE Final    Comment: (NOTE) SARS-CoV-2 target nucleic acids are NOT DETECTED.  The SARS-CoV-2 RNA is generally detectable in upper respiratory specimens during the acute phase of infection. The lowest concentration of SARS-CoV-2 viral copies this assay can detect is 138 copies/mL. A negative result does not preclude SARS-Cov-2 infection and should not be used as the sole basis for treatment or other patient management decisions. A negative result may occur with  improper specimen collection/handling, submission of specimen other than nasopharyngeal swab, presence of viral mutation(s) within the areas targeted by this assay, and inadequate number of viral copies(<138 copies/mL). A negative result must be combined with clinical observations, patient history, and epidemiological information. The expected result is Negative.  Fact Sheet for Patients:  EntrepreneurPulse.com.au  Fact Sheet for Healthcare Providers:  IncredibleEmployment.be  This test is no t yet approved or cleared by the [place] FDA and  has been authorized for detection and/or diagnosis of SARS-CoV-2 by FDA under an Emergency Use Authorization (EUA). This EUA will remain  in effect (meaning this test can be used) for the duration of the COVID-19 declaration under Section  564(b)(1) of the Act, 21 U.S.C.section 360bbb-3(b)(1), unless the authorization is terminated  or revoked sooner.       Influenza A by PCR NEGATIVE NEGATIVE Final   Influenza B by PCR NEGATIVE NEGATIVE Final    Comment: (NOTE) The Xpert Xpress SARS-CoV-2/FLU/RSV plus assay is intended as an aid in the diagnosis of influenza from Nasopharyngeal swab specimens and should not be used as a sole basis for treatment. Nasal washings and aspirates are unacceptable for Xpert Xpress SARS-CoV-2/FLU/RSV testing.  Fact Sheet for Patients: EntrepreneurPulse.com.au  Fact Sheet for Healthcare Providers: IncredibleEmployment.be  This test is not yet approved or cleared by the [place] FDA and has been authorized for detection and/or diagnosis of SARS-CoV-2 by FDA under an Emergency Use Authorization (EUA). This EUA will remain in effect (meaning this test can be used) for the duration of the COVID-19 declaration under Section 564(b)(1) of the Act, 21 U.S.C. section 360bbb-3(b)(1), unless the authorization is terminated or revoked.  Performed at [facility], [address], [place], [place] [address]   MRSA PCR Screening     Status: None   Collection Time: 05/09/20  2:31 PM   Specimen: Nasopharyngeal  Result Value Ref Range Status   MRSA by PCR NEGATIVE NEGATIVE Final    Comment:        The GeneXpert MRSA Assay (FDA approved for NASAL specimens only), is one component of a comprehensive MRSA colonization surveillance program. It is not intended to diagnose MRSA infection nor to guide or monitor treatment for MRSA infections. Performed at [facility], [place] [address], [place], [place] [address]   Surgical PCR screen     Status: None   Collection Time: 05/10/20 10:14 AM   Specimen: Nasal Mucosa; Nasal Swab  Result Value Ref Range Status   MRSA, PCR NEGATIVE NEGATIVE Final   Staphylococcus aureus NEGATIVE NEGATIVE Final    Comment:  (NOTE) The Xpert SA Assay (FDA approved for NASAL specimens in patients 44 years of age and older), is one component of a comprehensive surveillance program. It is not intended to diagnose infection nor to guide or monitor treatment. Performed at [facility], [place] [address], [place], [place] [address]          Radiology Studies: CT CHEST WO CONTRAST  Result Date: 05/10/2020 CLINICAL DATA:  History of pneumonia by report. Multifocal opacities on prior imaging. EXAM: CT CHEST WITHOUT CONTRAST TECHNIQUE: Multidetector CT imaging of  the chest was performed following the standard protocol without IV contrast. COMPARISON:  May 17, 2019. FINDINGS: Cardiovascular: RIGHT-sided transvenous pacer terminates in the pulmonary artery outflow tract in the proximal main pulmonary artery. Calcified atheromatous plaque of the thoracic aorta with normal caliber. Three-vessel coronary artery disease. Heart size is enlarged without pericardial effusion. Central pulmonary vasculature engorged unchanged from prior imaging. Limited assessment of cardiovascular structures given lack of intravenous contrast. Mediastinum/Nodes: No adenopathy in the mediastinum. Patulous esophagus with debris in the lumen. Scattered lymph nodes throughout the chest mildly prominent without pathologic enlargement. Lungs/Pleura: Patchy areas of airspace opacity throughout the chest. Nodule in the RIGHT lung base (image 114, series 8. Mainly ground-glass. 13 mm nodular ground-glass focus in the RIGHT lung base on image 103. Scattered ground-glass nodules essentially throughout the chest slightly more numerous in the mid chest though distributed in the upper lobes as well. Septal thickening. Basilar atelectasis. Upper Abdomen: Lobular hepatic contours. Cholelithiasis without pericholecystic stranding. Signs of portosystemic collateral suggested in the upper abdomen not well evaluated. Musculoskeletal: Cement augmentation of the L1  vertebral level seen on prior imaging. Osteopenia.  Signs of sternotomy. Worsening loss of height about the L2. Approximately 40-50% loss of height at L2, progressed from previous imaging where there was less than 40% loss of height. Superior endplate of L3 also with signs of fracture, likely new. L3 incompletely imaged. IMPRESSION: 1. Patchy areas of airspace opacity throughout the chest with basilar atelectasis. Findings could be seen in the setting of multifocal pneumonia, likely viral or atypical process. Given the patulous debris-filled esophagus would also entertained the possibility of a component of aspiration pneumonitis though findings are distributed throughout the lungs rather than at the basilar portions of the chest. 2. Transvenous pacer device is in the pulmonary artery outflow tract. Correlate with pacer function and consider repositioning as warranted. 3. Worsening of lumbar spine compression fractures at L2 and new compression fracture at L3. Loss of height is greatest at L2 as described with prior cement augmentation of L1. L3 is incompletely imaged. 4. Three-vessel coronary artery disease. 5. Patulous esophagus with debris in the lumen this may reflect reflux or esophageal dysmotility. 6. Signs of liver disease. 7. Cholelithiasis. 8. Aortic atherosclerosis. These results will be called to the ordering clinician or representative by the Radiologist Assistant, and communication documented in the PACS or [employer]. Aortic Atherosclerosis (ICD10-I70.0). Electronically Signed   By: Zetta Bills M.D.   On: 05/10/2020 18:23   DG Chest Port 1 View  Result Date: 05/11/2020 CLINICAL DATA:  Congestive heart failure and heart block. EXAM: PORTABLE CHEST 1 VIEW COMPARISON:  05/10/2020 FINDINGS: Stable cardiac enlargement. Right jugular transvenous pacemaker demonstrates stable position with the tip near the expected region of the pulmonary outflow tract. Pulmonary edema pattern improved since the  prior chest x-ray. No pleural effusions or pneumothorax. IMPRESSION: Improving pulmonary edema. Stable cardiomegaly. Electronically Signed   By: Aletta Edouard M.D.   On: 05/11/2020 08:10   DG CHEST PORT 1 VIEW  Result Date: 05/10/2020 CLINICAL DATA:  Shortness of breath and chest pain EXAM: PORTABLE CHEST 1 VIEW COMPARISON:  February 17, 2020 FINDINGS: There is cardiomegaly with pulmonary vascularity within normal limits. Patient is status post coronary artery bypass grafting. Apparent pacemaker lead in right atrial region. There is ill-defined airspace opacity in each upper lobe and left base region. No consolidation. No appreciable adenopathy. There is aortic atherosclerosis. No bone lesions. IMPRESSION: Ill-defined airspace opacity in each upper lobe and left base regions.  Appearance felt to be indicative of multifocal pneumonia. Question atypical organism pneumonia. Check of COVID-19 status advised. Cardiomegaly. Status post coronary artery bypass grafting. Apparent pacemaker via trans jugular approach with lead attached to right heart, likely right atrium. Aortic Atherosclerosis (ICD10-I70.0). Electronically Signed   By: Lowella Grip III M.D.   On: 05/10/2020 10:53        Scheduled Meds: . Chlorhexidine Gluconate Cloth  6 each Topical Daily  . ezetimibe  10 mg Oral QHS  . guaiFENesin  600 mg Oral BID  . lidocaine  1 patch Transdermal Q24H  . methocarbamol  500 mg Oral TID  . pantoprazole  40 mg Oral Daily  . senna-docusate  2 tablet Oral QHS  . sodium chloride flush  10-40 mL Intracatheter Q12H  . sodium chloride flush  3 mL Intravenous Q12H  . sodium chloride flush  3 mL Intravenous Q12H   Continuous Infusions: . sodium chloride    . sodium chloride 999 mL/hr at 05/11/20 1646  . albumin human 12.5 g (05/12/20 0420)  . ampicillin-sulbactam (UNASYN) IV 3 g (05/12/20 0004)  . DOPamine Stopped (05/11/20 1438)          Aline August, MD [facility] 05/12/2020,  7:16 AM

## 2020-05-12 NOTE — Evaluation (Addendum)
Physical Therapy Evaluation Patient Details Name: Samantha [name] MRN: [id number] DOB: Jul 17, 1950 Today's Date: 05/12/2020   History of Present Illness  70 year old admitted 3/12 with falls, unresponsiveness and bradycardia with HR 30-40 pt with comlete heart block. Transvenous pacemaker placed 3/12 PPM placed 3/14. PMHx:  CAD s/p CABG x3, chronic systolic CHF with LVEF of 35 to 40%, NASH with cirrhosis and portal HTN, PAF, HTN, L1-L2 compression fracture in February 2022, chronic hypoxic respiratory failure on home O2  Clinical Impression  Pt pleasant and willing to mobilize but reports bil LE pain limiting tolerance for standing. Pt with noted bruising bil LE and report of ongoing and increasing pain PTA. Pt with decreased strength, balance, transfers, functional mobility and awareness of precautions who will benefit from acute therapy to maximize mobility, safety and independence adhering to precautions to decrease burden of care. Pt educated for transfers and HEP with assist needed for all mobility. RN educated in importance of blocking knee with standing to prevent fall.     Follow Up Recommendations Supervision/Assistance - 24 hour;SNF    Equipment Recommendations  None recommended by PT    Recommendations for Other Services       Precautions / Restrictions Precautions Precautions: Fall;ICD/Pacemaker Restrictions Weight Bearing Restrictions: Yes LUE Weight Bearing: Non weight bearing (recent PPM)      Mobility  Bed Mobility Overal bed mobility: Needs Assistance Bed Mobility: Supine to Sit     Supine to sit: Mod assist;HOB elevated     General bed mobility comments: HOb 30 degrees with assist of pad to pivot to right with cues for sequence and significant assist to pivot hips    Transfers Overall transfer level: Needs assistance   Transfers: Sit to/from Stand;Stand Pivot Transfers Sit to Stand: Mod assist Stand pivot transfers: Mod assist       General transfer  comment: mod assist with knee blocked to stand from bed, recliner and BSC.  Pt with tendency for knee extension and posterior lean letting feet slide. For pivot bed to recliner with RW pt with max cues and assist to pivot pelvis as pt consistently attempting to move in the opposite direction. Knee blocked to stand from recliner with recliner and BSC switched behind pt to and from [facility] with total assist for pericare  Ambulation/Gait                Stairs            Wheelchair Mobility    Modified Rankin (Stroke Patients Only)       Balance Overall balance assessment: Needs assistance;History of Falls Sitting-balance support: No upper extremity supported;Feet supported;Feet unsupported Sitting balance-Leahy Scale: Fair Sitting balance - Comments: EOB with guarding   Standing balance support: Single extremity supported Standing balance-Leahy Scale: Poor Standing balance comment: significant UE support as well as support at trunk to maintain balance                             Pertinent Vitals/Pain Pain Assessment: 0-10 Pain Score: 6  Pain Location: bil LE pain Pain Descriptors / Indicators: Aching;Constant Pain Intervention(s): Limited activity within patient's tolerance;Monitored during session;Repositioned;Premedicated before session    Home Living Family/patient expects to be discharged to:: Private residence Living Arrangements: Other relatives Available Help at Discharge: Family;Available 24 hours/day Type of Home: House Home Access: Stairs to enter   [employer] of Steps: 1 Home Layout: Multi-level;Able to live on main level with bedroom/bathroom Home  Equipment: Bedside commode;Walker - 2 wheels;Shower seat;Hand held shower head      Prior Function Level of Independence: Independent with assistive device(s)         Comments: lives with brother, walking with RW,     Hand Dominance        Extremity/Trunk Assessment   Upper  Extremity Assessment Upper Extremity Assessment: Generalized weakness    Lower Extremity Assessment Lower Extremity Assessment: Generalized weakness (bil Le bruising and pain with weakness grossly 2/5)    Cervical / Trunk Assessment Cervical / Trunk Assessment: Kyphotic  Communication   Communication: No difficulties  Cognition Arousal/Alertness: Awake/alert Behavior During Therapy: WFL for tasks assessed/performed Overall Cognitive Status: Impaired/Different from baseline Area of Impairment: Following commands;Safety/judgement;Problem solving                       Following Commands: Follows one step commands consistently Safety/Judgement: Decreased awareness of safety;Decreased awareness of deficits   Problem Solving: Slow processing        General Comments      Exercises General Exercises - Lower Extremity Long Arc Quad: AAROM;Both;10 reps;Seated Hip Flexion/Marching: AAROM;Both;10 reps;Seated   Assessment/Plan    PT Assessment Patient needs continued PT services  PT Problem List Decreased strength;Decreased mobility;Decreased safety awareness;Decreased activity tolerance;Decreased balance;Decreased knowledge of use of DME;Decreased knowledge of precautions       PT Treatment Interventions DME instruction;Therapeutic exercise;Gait training;Balance training;Functional mobility training;Therapeutic activities;Patient/family education    PT Goals (Current goals can be found in the Care Plan section)  Acute Rehab PT Goals Patient Stated Goal: return home PT Goal Formulation: With patient Time For Goal Achievement: 05/26/20 Potential to Achieve Goals: Fair    Frequency Min 3X/week   Barriers to discharge Decreased caregiver support      Co-evaluation               AM-PAC PT "6 Clicks" Mobility  Outcome Measure Help needed turning from your back to your side while in a flat bed without using bedrails?: A Lot Help needed moving from lying on your  back to sitting on the side of a flat bed without using bedrails?: A Lot Help needed moving to and from a bed to a chair (including a wheelchair)?: A Lot Help needed standing up from a chair using your arms (e.g., wheelchair or bedside chair)?: A Lot Help needed to walk in hospital room?: Total Help needed climbing 3-5 steps with a railing? : Total 6 Click Score: 10    End of Session Equipment Utilized During Treatment: Gait belt Activity Tolerance: Patient tolerated treatment well Patient left: in chair;with call bell/phone within reach;with nursing/sitter in room;with chair alarm set Nurse Communication: Mobility status PT Visit Diagnosis: Other abnormalities of gait and mobility (R26.89);Difficulty in walking, not elsewhere classified (R26.2);Muscle weakness (generalized) (M62.81)    Time: 9622-2979 PT Time Calculation (min) (ACUTE ONLY): 33 min   Charges:   PT Evaluation $PT Eval Moderate Complexity: 1 Mod PT Treatments $Therapeutic Activity: 8-22 mins        Merrick Feutz P, PT Acute Rehabilitation Services Pager: [phone] Office: [facility] 05/12/2020, 10:12 AM

## 2020-05-12 NOTE — Progress Notes (Signed)
Family requests "[facility]" for SNF placement if possible.

## 2020-05-13 DIAGNOSIS — I25119 Atherosclerotic heart disease of native coronary artery with unspecified angina pectoris: Secondary | ICD-10-CM | POA: Diagnosis not present

## 2020-05-13 DIAGNOSIS — I5020 Unspecified systolic (congestive) heart failure: Secondary | ICD-10-CM

## 2020-05-13 DIAGNOSIS — I5043 Acute on chronic combined systolic (congestive) and diastolic (congestive) heart failure: Secondary | ICD-10-CM | POA: Diagnosis not present

## 2020-05-13 DIAGNOSIS — R570 Cardiogenic shock: Secondary | ICD-10-CM | POA: Diagnosis not present

## 2020-05-13 DIAGNOSIS — Z515 Encounter for palliative care: Secondary | ICD-10-CM

## 2020-05-13 DIAGNOSIS — I442 Atrioventricular block, complete: Secondary | ICD-10-CM | POA: Diagnosis not present

## 2020-05-13 DIAGNOSIS — Z7189 Other specified counseling: Secondary | ICD-10-CM

## 2020-05-13 DIAGNOSIS — Z951 Presence of aortocoronary bypass graft: Secondary | ICD-10-CM | POA: Diagnosis not present

## 2020-05-13 DIAGNOSIS — I509 Heart failure, unspecified: Secondary | ICD-10-CM

## 2020-05-13 LAB — CBC
HCT: 30.8 % — ABNORMAL LOW (ref 36.0–46.0)
Hemoglobin: 9.8 g/dL — ABNORMAL LOW (ref 12.0–15.0)
MCH: 30.6 pg (ref 26.0–34.0)
MCHC: 31.8 g/dL (ref 30.0–36.0)
MCV: 96.3 fL (ref 80.0–100.0)
Platelets: 44 10*3/uL — ABNORMAL LOW (ref 150–400)
RBC: 3.2 MIL/uL — ABNORMAL LOW (ref 3.87–5.11)
RDW: 18.3 % — ABNORMAL HIGH (ref 11.5–15.5)
WBC: 4.8 10*3/uL (ref 4.0–10.5)
nRBC: 0 % (ref 0.0–0.2)

## 2020-05-13 LAB — BASIC METABOLIC PANEL
Anion gap: 11 (ref 5–15)
BUN: 41 mg/dL — ABNORMAL HIGH (ref 8–23)
CO2: 23 mmol/L (ref 22–32)
Calcium: 9 mg/dL (ref 8.9–10.3)
Chloride: 100 mmol/L (ref 98–111)
Creatinine, Ser: 1.91 mg/dL — ABNORMAL HIGH (ref 0.44–1.00)
GFR, Estimated: 28 mL/min — ABNORMAL LOW (ref >60)
Glucose, Bld: 87 mg/dL (ref 70–99)
Potassium: 4.1 mmol/L (ref 3.5–5.1)
Sodium: 134 mmol/L — ABNORMAL LOW (ref 135–145)

## 2020-05-13 LAB — PROTIME-INR
INR: 2.2 — ABNORMAL HIGH (ref 0.8–1.2)
Prothrombin Time: 23.4 seconds — ABNORMAL HIGH (ref 11.4–15.2)

## 2020-05-13 MED ORDER — OXYCODONE HCL 5 MG PO TABS: 5.0000 mg | ORAL_TABLET | ORAL | Status: DC | PRN

## 2020-05-13 MED ORDER — DULOXETINE HCL 20 MG PO CPEP
20.0000 mg | ORAL_CAPSULE | Freq: Every day | ORAL | Status: DC
  Administered 2020-05-13 – 2020-05-19 (×7): 20 mg via ORAL
  Filled 2020-05-13 (×8): qty 1

## 2020-05-13 MED ORDER — OXYCODONE HCL ER 10 MG PO T12A
20.0000 mg | EXTENDED_RELEASE_TABLET | Freq: Two times a day (BID) | ORAL | Status: DC
  Administered 2020-05-13 – 2020-05-19 (×14): 20 mg via ORAL
  Filled 2020-05-13: qty 2
  Filled 2020-05-13: qty 1
  Filled 2020-05-13: qty 2
  Filled 2020-05-13: qty 1
  Filled 2020-05-13 (×2): qty 2
  Filled 2020-05-13: qty 1
  Filled 2020-05-13: qty 2
  Filled 2020-05-13: qty 1
  Filled 2020-05-13: qty 2
  Filled 2020-05-13: qty 1
  Filled 2020-05-13 (×3): qty 2

## 2020-05-13 NOTE — Progress Notes (Signed)
PT Cancellation Note  Patient Details Name: [name] MRN: [id number] DOB: [birth year]   Cancelled Treatment:      Attempted in am but pt with palliative team.  Will f/u as able.  Abran Richard, PT Acute Rehab Services Pager [phone] [facility] [place] 05/13/2020, 2:55 PM

## 2020-05-13 NOTE — Evaluation (Signed)
Occupational Therapy Evaluation Patient Details Name: Samantha [name] MRN: [id number] DOB: [birth year] Today's Date: 05/13/2020    History of Present Illness 70 year old admitted 3/12 with falls, unresponsiveness and bradycardia with HR 30-40 pt with comlete heart block. Transvenous pacemaker placed 3/12 PPM placed 3/14. PMHx:  CAD s/p CABG x3, chronic systolic CHF with LVEF of 35 to 40%, NASH with cirrhosis and portal HTN, PAF, HTN, L1-L2 compression fracture in February 2022, chronic hypoxic respiratory failure on home O2   Clinical Impression   Patient admitted for the diagnosis above.  PTA she was living alone with assist as needed from family.  She walked with a 4WRW.  Deficits impacting independence are listed below.  Currently she is needing up to Mod A for basic mobility and upper body ADL, Max A for lower body ADL and toileting.  OT is indicated in the acute setting to maximize functional independence and eventual transition to SNF for continued rehab prior to home.      Follow Up Recommendations  SNF    Equipment Recommendations  3 in 1 bedside commode;Tub/shower bench    Recommendations for Other Services       Precautions / Restrictions Precautions Precautions: Fall;ICD/Pacemaker Restrictions Weight Bearing Restrictions: Yes LUE Weight Bearing: Partial weight bearing Other Position/Activity Restrictions: limit shoulder flexion 90 degrees, and limit ABd and ER at L shoulder      Mobility Bed Mobility Overal bed mobility: Needs Assistance Bed Mobility: Supine to Sit     Supine to sit: Min assist;HOB elevated          Transfers Overall transfer level: Needs assistance   Transfers: Sit to/from Stand;Stand Pivot Transfers Sit to Stand: Mod assist Stand pivot transfers: Mod assist            Balance   Sitting-balance support: No upper extremity supported;Feet supported Sitting balance-Leahy Scale: Fair     Standing balance support: Bilateral upper  extremity supported Standing balance-Leahy Scale: Poor                             ADL either performed or assessed with clinical judgement   ADL Overall ADL's : Needs assistance/impaired Eating/Feeding: Set up;Bed level   Grooming: Wash/dry hands;Wash/dry face;Set up;Bed level   Upper Body Bathing: Moderate assistance;Sitting   Lower Body Bathing: Maximal assistance;Sitting/lateral leans   Upper Body Dressing : Moderate assistance;Sitting   Lower Body Dressing: Maximal assistance;Sitting/lateral leans               Functional mobility during ADLs: Moderate assistance;Rolling walker       Vision Baseline Vision/History: Wears glasses Wears Glasses: At all times Patient Visual Report: No change from baseline       Perception     Praxis      Pertinent Vitals/Pain Pain Assessment: Faces Faces Pain Scale: Hurts even more Pain Location: bil LE pain Pain Descriptors / Indicators: Aching;Constant Pain Intervention(s): Monitored during session     Hand Dominance Right   Extremity/Trunk Assessment Upper Extremity Assessment Upper Extremity Assessment: Generalized weakness   Lower Extremity Assessment Lower Extremity Assessment: Generalized weakness       Communication Communication Communication: No difficulties   Cognition Arousal/Alertness: Awake/alert Behavior During Therapy: WFL for tasks assessed/performed Overall Cognitive Status: Impaired/Different from baseline                               Problem Solving:  Slow processing     General Comments       Exercises     Shoulder Instructions      Home Living Family/patient expects to be discharged to:: Skilled nursing facility Living Arrangements: Other relatives Available Help at Discharge: Family;Available 24 hours/day Type of Home: House Home Access: Stairs to enter     Home Layout: Multi-level;Able to live on main level with bedroom/bathroom     Bathroom  Shower/Tub: Occupational psychologist: Standard     Home Equipment: Bedside commode;Walker - 2 wheels;Shower seat;Hand held shower head          Prior Functioning/Environment Level of Independence: Independent with assistive device(s)                 OT Problem List: Decreased strength;Decreased range of motion;Decreased activity tolerance;Impaired balance (sitting and/or standing);Decreased knowledge of use of DME or AE;Pain      OT Treatment/Interventions: Self-care/ADL training;Therapeutic exercise;Energy conservation;DME and/or AE instruction;Balance training;Therapeutic activities    OT Goals(Current goals can be found in the care plan section) Acute Rehab OT Goals Patient Stated Goal: keep moving and hurt less OT Goal Formulation: With patient Time For Goal Achievement: 05/27/20 Potential to Achieve Goals: Good ADL Goals Pt Will Perform Grooming: with min guard assist;standing Pt Will Perform Upper Body Bathing: with supervision;sitting;standing Pt Will Perform Upper Body Dressing: with supervision;sitting;standing Pt Will Transfer to Toilet: with supervision;regular height toilet;ambulating Pt Will Perform Toileting - Clothing Manipulation and hygiene: with min guard assist;sit to/from stand  OT Frequency: Min 2X/week   Barriers to D/C:    none noted       Co-evaluation              AM-PAC OT "6 Clicks" Daily Activity     Outcome Measure Help from another person eating meals?: A Little Help from another person taking care of personal grooming?: A Little Help from another person toileting, which includes using toliet, bedpan, or urinal?: A Lot Help from another person bathing (including washing, rinsing, drying)?: A Lot Help from another person to put on and taking off regular upper body clothing?: A Lot Help from another person to put on and taking off regular lower body clothing?: A Lot 6 Click Score: 14   End of Session Equipment Utilized  During Treatment: Gait belt;Rolling walker Nurse Communication: Mobility status  Activity Tolerance: Patient tolerated treatment well Patient left: in chair;with call bell/phone within reach;with chair alarm set  OT Visit Diagnosis: Unsteadiness on feet (R26.81);Dizziness and giddiness (R42);Pain Pain - Right/Left: Right Pain - part of body: Leg                Time: 0830-0900 OT Time Calculation (min): 30 min Charges:  OT General Charges $OT Visit: 1 Visit OT Evaluation $OT Eval Moderate Complexity: 1 Mod OT Treatments $Therapeutic Activity: 8-22 mins  05/13/2020  Rich, OTR/L  Acute Rehabilitation Services  Office:  [phone]   Metta Clines 05/13/2020, 9:09 AM

## 2020-05-13 NOTE — TOC Initial Note (Signed)
Transition of Care [facility]) - Initial/Assessment Note    Patient Details  Name: Samantha [name] MRN: [id number] Date of Birth: 12-10-1950  [facility] CM/SW Contact:    Trula Ore, Long Phone Number: 05/13/2020, 2:12 PM  Clinical Narrative:                  CSW received consult for possible SNF placement at time of discharge. CSW spoke with patient regarding PT recommendation of SNF placement at time of discharge. Patient comes from home with brother and sister-n-law. Patient expressed understanding of PT recommendation and is agreeable to SNF placement at time of discharge. Patient reports preference for [facility] .Patient gave CSW permission to fax out initial referral near [place] and [place] area.  Patient has received the COVID vaccines as well as booster. No further questions reported at this time. CSW to continue to follow and assist with discharge planning needs.   Expected Discharge Plan: Skilled Nursing Facility Barriers to Discharge: Continued Medical Work up   Patient Goals and CMS Choice Patient states their goals for this hospitalization and ongoing recovery are:: to go to SNF CMS Medicare.gov Compare Post Acute Care list provided to:: Patient Choice offered to / list presented to : Patient  Expected Discharge Plan and Services Expected Discharge Plan: [place] In-house Referral: Clinical Social Work     Living arrangements for the past 2 months: [place]                                      Prior Living Arrangements/Services Living arrangements for the past 2 months: Single Family Home Lives with:: Radiographer, therapeutic (Comment) (lives with brother and dauther n Sports coach) Patient language and need for interpreter reviewed:: Yes Do you feel safe going back to the place where you live?: No   SNF  Need for Family Participation in Patient Care: Yes (Comment) Care giver support system in place?: Yes (comment)    Criminal Activity/Legal Involvement Pertinent to Current Situation/Hospitalization: No - Comment as needed  Activities of Daily Living Home Assistive Devices/Equipment: None ADL Screening (condition at time of admission) Patient's cognitive ability adequate to safely complete daily activities?: Yes Is the patient deaf or have difficulty hearing?: No Does the patient have difficulty seeing, even when wearing glasses/contacts?: No Does the patient have difficulty concentrating, remembering, or making decisions?: No Patient able to express need for assistance with ADLs?: Yes Does the patient have difficulty dressing or bathing?: No Independently performs ADLs?: No Does the patient have difficulty walking or climbing stairs?: No Weakness of Legs: Both Weakness of Arms/Hands: Both  Permission Sought/Granted Permission sought to share information with : Case Manager,Family Chief Financial Officer Permission granted to share information with : Yes, Verbal Permission Granted  Share Information with NAME: Ulice Dash  Permission granted to share info w AGENCY: SNF  Permission granted to share info w Relationship: brother  Permission granted to share info w Contact Information: Ulice Dash [phone]  Emotional Assessment Appearance:: Appears stated age Attitude/Demeanor/Rapport: Gracious Affect (typically observed): Calm Orientation: : Oriented to Self,Oriented to Place,Oriented to  Time,Oriented to Situation Alcohol / Substance Use: Not Applicable Psych Involvement: No (comment)  Admission diagnosis:  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 NSTEMI (non-ST elevated myocardial infarction) ([place])   . Pacemaker   . 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 Symptomatic bradycardia  05/09/2020  . Heart block AV complete ([place]) 05/09/2020  . Acute on chronic combined systolic and diastolic CHF (congestive  heart failure) ([place]) 05/09/2020  . Paroxysmal ventricular tachycardia ([place]) - related to Bradycardia  05/09/2020  . L1 and L 2--Lumbar compression fracture, closed, initial encounter ([place]) 04/14/2020  . Chronic respiratory failure with hypoxia -CHF  04/13/2020  . Compression fracture of L2 lumbar vertebra ([place]) 04/12/2020  . Cholestasis, intrahepatic 03/12/2020  . Lobar pneumonia ([place]) 02/18/2020  . Hepatic cirrhosis ([place])   . Hypokalemia   . Anasarca 02/14/2020  . Cirrhosis of liver with ascites ([place]) 02/14/2020  . Volume overload 02/13/2020  . Constipation 02/11/2020  . Peripheral edema 02/03/2020  . Thrombocytopenia ([place]) 01/24/2020  . Left leg swelling 01/24/2020  . Hyperammonemia ([place]) 01/24/2020  . Prolonged QT interval 01/24/2020  . Elevated troponin 01/24/2020  . Intractable nausea and vomiting 01/23/2020  . Atypical chest pain 08/06/2019  . GERD (gastroesophageal reflux disease) 08/06/2019  . Acute respiratory failure with hypoxia ([place]) 05/23/2019  . Abdominal pain 05/20/2019  . Intractable abdominal pain 05/19/2019  . Paroxysmal atrial fibrillation ([place]) 05/19/2019  . Anxiety with depression 05/19/2019  . Sinus arrhythmia 05/19/2019  . Iron deficiency anemia 02/05/2019  . Chronic combined systolic and diastolic CHF (congestive heart failure) ([place]) 04/17/2018  . Heart failure ([place]) 04/17/2018  . Osteoarthritis of spine with radiculopathy, cervical region 01/12/2018  . Paresthesia of both hands 12/14/2017  . Recurrent falls 12/14/2017  . Closed compression fracture of first lumbar vertebra ([place]) 08/23/2017  . Lumbar spondylosis with myelopathy 08/23/2017  . Carpal tunnel syndrome of right wrist 04/05/2017  . Rectal pain 03/06/2017  . Influenza A (H1N1) 02/22/2017  . Rectal bleeding 02/22/2017  . Closed fracture of lower end of right radius with routine healing 02/20/2017  . Injury of triangular fibrocartilage complex of right wrist 02/20/2017  . Pain 02/10/2017  .  Other cirrhosis of liver ([place]) 10/26/2016  . Persistent atrial fibrillation 08/16/2016  . Cardiomyopathy, ischemic-35-40% by cath  01/11/2015  . Obesity 09/24/2013  . Unspecified hereditary and idiopathic peripheral neuropathy 01/10/2013  . Hereditary and idiopathic peripheral neuropathy 01/10/2013  . Cirrhosis, non-alcoholic ([place]) 02/54/2706  . Esophageal varices- Plavix stopped 12/23/2011  . GI bleed 12/23/2011  . Idiopathic esophageal varices without bleeding ([place]) 12/23/2011  . Anemia 04/22/2011  . Coronary artery disease involving native coronary artery of native heart with angina pectoris ([place]) 08/21/2009  . Mixed hyperlipidemia 06/04/2008  . Essential hypertension 06/04/2008  . S/P CABG x 3- 2001 06/04/2008   PCP:  [name], MD Pharmacy:   [id number] [place], [place]Phone: [phone]: [phone] [place] [place] [address] [address] [place]Phone: [phone]: [phone]    Social Determinants of Health (SDOH) Interventions    Readmission Risk Interventions Readmission Risk Prevention Plan 02/19/2020  PCP or Specialist Appt within 3-5 Days Complete  Some recent data might be hidden

## 2020-05-13 NOTE — Progress Notes (Signed)
[facility] Consult Progress Note                                                                               Patient Demographics  Samantha [name], is a 70 y.o. female, DOB - 01-09-1951, DEY:[id number]  Admit date - 05/09/2020   Admitting Physician Freada Bergeron, MD  Outpatient Primary MD for the patient is Sasser, Silvestre Moment, MD  Outpatient specialists:   LOS - 4  days   Medical records reviewed and are as summarized below:    Chief Complaint  Patient presents with  . Bradycardia       Brief summary   70 year old female with history of CAD status post CABG x3, chronic systolic CHF with LVEF of 35 to 40% in January 2021, NASH with cirrhosis and portal hypertension, PAF, hypertension, L1-L2 compression fracture in February 2022, chronic hypoxic respiratory failure on home oxygen as needed presented with multiple falls, unresponsiveness and symptomatic bradycardia with heart rates in the 30s-40s.  She was transferred from [facility] to [facility] under cardiology care.  She was started on dopamine drip and underwent transvenous pacemaker.  TRH service was consulted on 05/10/2020 for determination of CHF versus pneumonia.   Assessment & Plan    Principal Problem:   Heart block AV complete (HCC) -Underwent permanent pacemaker placement on 3/14 -Management per EP and cardiology, primary service  Cardiogenic shock, acute on chronic systolic CHF, mild to moderate MR, multivessel CAD/CABG in 2001 Hyperlipidemia, paroxysmal A. fib not on anticoagulation -Management per primary cardiology team.  Currently off dopamine drip and not on diuretics -Palliative medicine has been consulted by cardiology for goals of care  ?  Pneumonia, patchy airspace opacities -CT of the chest showed patchy areas of airspace opacities suggestive of multifocal pneumonia, viral or atypical cannot rule out aspiration -Currently O2 sats 100% on room air, procalcitonin <0.1  suggests that likely not bacterial pneumonia -Continue IV Unasyn for total of 5 days, day #3 today  AKI on CKD stage IIIa -Possibly from cardiorenal syndrome, baseline creatinine 1.1 -On admission creatinine 2.97, improving 1.9 today  NASH with cirrhosis of liver, portal hypertension with elevated LFTs -Patient follows up with GI as an outpatient, LFTs improving  Chronic thrombocytopenia -Monitor platelet counts, currently close to her baseline  COPD with chronic hypoxic respiratory failure -Currently stable, no wheezing, O2 sats 100% on room air  Obesity Estimated body mass index is 32.54 kg/m as calculated from the following:   Height as of this encounter: 5\' 2"  (1.575 m).   Weight as of this encounter: 80.7 kg.  Code Status: Full code DVT Prophylaxis:  Place and maintain sequential compression device Start: 05/11/20 0834 SCD's Start: 05/09/20 1832   Level of Care: Level of care: Telemetry Cardiac Family Communication: Discussed all imaging results, lab results, explained to the patient    Disposition Plan:     per cardiology  Time Spent in minutes 25 minutes  Antimicrobials:   Anti-infectives (From admission, onward)   Start     Dose/Rate Route Frequency Ordered Stop   05/11/20 1200  Ampicillin-Sulbactam (UNASYN) 3 g in sodium chloride  0.9 % 100 mL IVPB        3 g 200 mL/hr over 30 Minutes Intravenous Every 12 hours 05/11/20 1107 05/16/20 1159   05/11/20 0800  gentamicin (GARAMYCIN) 80 mg in sodium chloride 0.9 % 500 mL irrigation        80 mg Irrigation On call 05/11/20 0446 05/11/20 1730   05/11/20 0800  vancomycin (VANCOCIN) IVPB 1000 mg/200 mL premix        1,000 mg 200 mL/hr over 60 Minutes Intravenous On call 05/11/20 0446 05/11/20 1752          Medications  Scheduled Meds: . Chlorhexidine Gluconate Cloth  6 each Topical Daily  . ezetimibe  10 mg Oral QHS  . guaiFENesin  600 mg Oral BID  . lidocaine  1 patch Transdermal Q24H  . methocarbamol  500  mg Oral TID  . metoprolol tartrate  12.5 mg Oral BID  . pantoprazole  40 mg Oral Daily  . senna-docusate  2 tablet Oral QHS  . sodium chloride flush  10-40 mL Intracatheter Q12H  . sodium chloride flush  3 mL Intravenous Q12H  . sodium chloride flush  3 mL Intravenous Q12H   Continuous Infusions: . sodium chloride    . sodium chloride 999 mL/hr at 05/11/20 1646  . ampicillin-sulbactam (UNASYN) IV 3 g (05/12/20 2335)  . DOPamine Stopped (05/11/20 1438)   PRN Meds:.sodium chloride, sodium chloride, acetaminophen, albuterol, ipratropium, oxyCODONE, pneumococcal 23 valent vaccine, sodium chloride flush, sodium chloride flush, sodium chloride flush      Subjective:   Samantha [name] was seen and examined today.  Sitting up in the chair, no complaints.  No acute issues overnight, no fevers.  Currently no coughing, chest pain or abdominal pain.   Objective:   Vitals:   05/13/20 0300 05/13/20 0400 05/13/20 0500 05/13/20 0800  BP:  (!) 100/50  90/71  Pulse:  66  92  Resp:  15  20  Temp: 98.4 F (36.9 C)     TempSrc: Oral   Oral  SpO2:  94%  100%  Weight:   80.7 kg   Height:        Intake/Output Summary (Last 24 hours) at 05/13/2020 1007 Last data filed at 05/13/2020 0500 Gross per 24 hour  Intake 722.59 ml  Output 500 ml  Net 222.59 ml     Wt Readings from Last 3 Encounters:  05/13/20 80.7 kg  04/12/20 74.8 kg  04/08/20 77.7 kg     Exam  General: Alert and oriented , NAD  Cardiovascular: S1 S2 auscultated, RRR, 2/6 holosystolic murmur, pacer +  Respiratory: Decreased breath sound at the bases  Gastrointestinal: Soft, nontender, nondistended, + bowel sounds  Ext: trace pedal edema bilaterally  Neuro no new deficits  Musculoskeletal: No digital cyanosis, clubbing  Skin: No rashes  Psych: Normal affect and demeanor   Data Reviewed:  I have personally reviewed following labs and imaging studies  Micro Results Recent Results (from the past 240 hour(s))   Resp Panel by RT-PCR (Flu A&B, Covid) Nasopharyngeal Swab     Status: None   Collection Time: 05/09/20  1:05 PM   Specimen: Nasopharyngeal Swab; Nasopharyngeal(NP) swabs in vial transport medium  Result Value Ref Range Status   SARS Coronavirus 2 by RT PCR NEGATIVE NEGATIVE Final    Comment: (NOTE) SARS-CoV-2 target nucleic acids are NOT DETECTED.  The SARS-CoV-2 RNA is generally detectable in upper respiratory specimens during the acute phase of infection. The lowest concentration of SARS-CoV-2  viral copies this assay can detect is 138 copies/mL. A negative result does not preclude SARS-Cov-2 infection and should not be used as the sole basis for treatment or other patient management decisions. A negative result may occur with  improper specimen collection/handling, submission of specimen other than nasopharyngeal swab, presence of viral mutation(s) within the areas targeted by this assay, and inadequate number of viral copies(<138 copies/mL). A negative result must be combined with clinical observations, patient history, and epidemiological information. The expected result is Negative.  Fact Sheet for Patients:  EntrepreneurPulse.com.au  Fact Sheet for Healthcare Providers:  IncredibleEmployment.be  This test is no t yet approved or cleared by the [place] FDA and  has been authorized for detection and/or diagnosis of SARS-CoV-2 by FDA under an Emergency Use Authorization (EUA). This EUA will remain  in effect (meaning this test can be used) for the duration of the COVID-19 declaration under Section 564(b)(1) of the Act, 21 U.S.C.section 360bbb-3(b)(1), unless the authorization is terminated  or revoked sooner.       Influenza A by PCR NEGATIVE NEGATIVE Final   Influenza B by PCR NEGATIVE NEGATIVE Final    Comment: (NOTE) The Xpert Xpress SARS-CoV-2/FLU/RSV plus assay is intended as an aid in the diagnosis of influenza from  Nasopharyngeal swab specimens and should not be used as a sole basis for treatment. Nasal washings and aspirates are unacceptable for Xpert Xpress SARS-CoV-2/FLU/RSV testing.  Fact Sheet for Patients: EntrepreneurPulse.com.au  Fact Sheet for Healthcare Providers: IncredibleEmployment.be  This test is not yet approved or cleared by the [place] FDA and has been authorized for detection and/or diagnosis of SARS-CoV-2 by FDA under an Emergency Use Authorization (EUA). This EUA will remain in effect (meaning this test can be used) for the duration of the COVID-19 declaration under Section 564(b)(1) of the Act, 21 U.S.C. section 360bbb-3(b)(1), unless the authorization is terminated or revoked.  Performed at [facility], [address] [place], [place] [address]   MRSA PCR Screening     Status: None   Collection Time: 05/09/20  2:31 PM   Specimen: Nasopharyngeal  Result Value Ref Range Status   MRSA by PCR NEGATIVE NEGATIVE Final    Comment:        The GeneXpert MRSA Assay (FDA approved for NASAL specimens only), is one component of a comprehensive MRSA colonization surveillance program. It is not intended to diagnose MRSA infection nor to guide or monitor treatment for MRSA infections. Performed at [facility], [place] [address] [place], [place] [address]   Surgical PCR screen     Status: None   Collection Time: 05/10/20 10:14 AM   Specimen: Nasal Mucosa; Nasal Swab  Result Value Ref Range Status   MRSA, PCR NEGATIVE NEGATIVE Final   Staphylococcus aureus NEGATIVE NEGATIVE Final    Comment: (NOTE) The Xpert SA Assay (FDA approved for NASAL specimens in patients 44 years of age and older), is one component of a comprehensive surveillance program. It is not intended to diagnose infection nor to guide or monitor treatment. Performed at [facility], [place] [address] [place], [place] [address]     Radiology Reports DG  Chest 2 View  Result Date: 05/12/2020 CLINICAL DATA:  Post pacer placement EXAM: CHEST - 2 VIEW COMPARISON:  05/11/2020 FINDINGS: Interval placement of left chest wall dual lead pacemaker. Leads overlie the right atrium and right ventricle. No pneumothorax. Removal of temporary pacemaker with right IJ introducer remaining present. Similar mild pulmonary edema and  cardiomegaly. IMPRESSION: Post pacemaker placement without complication. Similar mild pulmonary edema and cardiomegaly. Electronically Signed   By: Macy Mis M.D.   On: 05/12/2020 08:15   CT CHEST WO CONTRAST  Result Date: 05/10/2020 CLINICAL DATA:  History of pneumonia by report. Multifocal opacities on prior imaging. EXAM: CT CHEST WITHOUT CONTRAST TECHNIQUE: Multidetector CT imaging of the chest was performed following the standard protocol without IV contrast. COMPARISON:  May 17, 2019. FINDINGS: Cardiovascular: RIGHT-sided transvenous pacer terminates in the pulmonary artery outflow tract in the proximal main pulmonary artery. Calcified atheromatous plaque of the thoracic aorta with normal caliber. Three-vessel coronary artery disease. Heart size is enlarged without pericardial effusion. Central pulmonary vasculature engorged unchanged from prior imaging. Limited assessment of cardiovascular structures given lack of intravenous contrast. Mediastinum/Nodes: No adenopathy in the mediastinum. Patulous esophagus with debris in the lumen. Scattered lymph nodes throughout the chest mildly prominent without pathologic enlargement. Lungs/Pleura: Patchy areas of airspace opacity throughout the chest. Nodule in the RIGHT lung base (image 114, series 8. Mainly ground-glass. 13 mm nodular ground-glass focus in the RIGHT lung base on image 103. Scattered ground-glass nodules essentially throughout the chest slightly more numerous in the mid chest though distributed in the upper lobes as well. Septal thickening. Basilar atelectasis. Upper Abdomen:  Lobular hepatic contours. Cholelithiasis without pericholecystic stranding. Signs of portosystemic collateral suggested in the upper abdomen not well evaluated. Musculoskeletal: Cement augmentation of the L1 vertebral level seen on prior imaging. Osteopenia.  Signs of sternotomy. Worsening loss of height about the L2. Approximately 40-50% loss of height at L2, progressed from previous imaging where there was less than 40% loss of height. Superior endplate of L3 also with signs of fracture, likely new. L3 incompletely imaged. IMPRESSION: 1. Patchy areas of airspace opacity throughout the chest with basilar atelectasis. Findings could be seen in the setting of multifocal pneumonia, likely viral or atypical process. Given the patulous debris-filled esophagus would also entertained the possibility of a component of aspiration pneumonitis though findings are distributed throughout the lungs rather than at the basilar portions of the chest. 2. Transvenous pacer device is in the pulmonary artery outflow tract. Correlate with pacer function and consider repositioning as warranted. 3. Worsening of lumbar spine compression fractures at L2 and new compression fracture at L3. Loss of height is greatest at L2 as described with prior cement augmentation of L1. L3 is incompletely imaged. 4. Three-vessel coronary artery disease. 5. Patulous esophagus with debris in the lumen this may reflect reflux or esophageal dysmotility. 6. Signs of liver disease. 7. Cholelithiasis. 8. Aortic atherosclerosis. These results will be called to the ordering clinician or representative by the Radiologist Assistant, and communication documented in the PACS or [employer]. Aortic Atherosclerosis (ICD10-I70.0). Electronically Signed   By: Zetta Bills M.D.   On: 05/10/2020 18:23   MR LUMBAR SPINE WO CONTRAST  Result Date: 04/13/2020 CLINICAL DATA:  Low back pain down both legs EXAM: MRI LUMBAR SPINE WITHOUT CONTRAST TECHNIQUE: Multiplanar,  multisequence MR imaging of the lumbar spine was performed. No intravenous contrast was administered. COMPARISON:  No recent MR imaging. Prior MRI is from 2011. Correlation made with CT from December 2021 FINDINGS: Motion artifact is present. Segmentation: Standard. Alignment:  Trace retrolisthesis at L1-L2. Vertebrae: Chronic L1 compression fracture with evidence of cement augmentation. New mild loss of height at the superior and inferior endplates of L2 and eccentric to the right at L3. No substantial marrow edema. Postoperative changes of interbody fusion again identified at L4-L5 and  L5-S1. Hardware is not well evaluated on this study and there is associated susceptibility artifact. Conus medullaris and cauda equina: Conus extends to the L1-L2 level. Conus and cauda equina appear normal. Paraspinal and other soft tissues: Chronic postoperative changes. Disc levels: Congenital narrowing of the spinal canal. T12-L1: Minimal retropulsion of superior endplate of L1. Mild facet arthropathy. No significant canal or foraminal stenosis. L1-L2: Disc bulge. Mild facet arthropathy with ligamentum flavum infolding. No significant canal or foraminal stenosis. L2-L3: Disc bulge. Mild facet arthropathy ligamentum flavum infolding. Mild canal stenosis. Mild foraminal stenosis. L3-L4: Disc bulge. Mild facet arthropathy with ligamentum flavum infolding. No significant canal or foraminal stenosis. L4-L5: Postoperative level. Posterior decompression. No canal stenosis. Foramina are distorted by artifact. On the right, this is related to posterior positioning of the interbody cage also present on the prior study. Probable mild foraminal stenosis. L5-S1: Operative level. Posterior decompression. Endplate osteophytes. No significant canal or foraminal stenosis. IMPRESSION: Mild L1 and L2 compression fractures are new since February 13, 2020. However, there is no significant marrow edema to suggest acuity. Degenerative and  postoperative changes as detailed above. No high-grade stenosis identified. Electronically Signed   By: Macy Mis M.D.   On: 04/13/2020 18:18   CARDIAC CATHETERIZATION  Result Date: 05/09/2020 SUMMARY  Complete Heart Block with Junctional Escape Rhythm -> and intermittent AI VR  Successful right IJ Temporary Pacemaker Insertion-> pacing at the RVOT, best place for capture.  Upon insertion of the sheath, the patient went into AI VR with rates of the 110to 120s -> at that point I decided to forego right heart catheterization and just place the temporary pacemaker. => Temporary pacer used to overdrive pace out of AI VR. 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 RECOMMENDATIONS  Return to nursing unit, continue on dopamine wean as tolerated for blood pressures.  Patient is now essentially pacemaker dependent-would need backup transcutaneous pacing in place if TPM becomes unstable We will need formal EP evaluation to determine timing of PPM placement. Glenetta Hew, MD   DG Chest Port 1 View  Result Date: 05/11/2020 CLINICAL DATA:  Congestive heart failure and heart block. EXAM: PORTABLE CHEST 1 VIEW COMPARISON:  05/10/2020 FINDINGS: Stable cardiac enlargement. Right jugular transvenous pacemaker demonstrates stable position with the tip near the expected region of the pulmonary outflow tract. Pulmonary edema pattern improved since the prior chest x-ray. No pleural effusions or pneumothorax. IMPRESSION: Improving pulmonary edema. Stable cardiomegaly. Electronically Signed   By: Aletta Edouard M.D.   On: 05/11/2020 08:10   DG CHEST PORT 1 VIEW  Result Date: 05/10/2020 CLINICAL DATA:  Shortness of breath and chest pain EXAM: PORTABLE CHEST 1 VIEW COMPARISON:  February 17, 2020 FINDINGS: There is cardiomegaly with pulmonary vascularity within normal limits. Patient is status post coronary artery bypass grafting. Apparent pacemaker lead in right atrial region. There is ill-defined airspace opacity in  each upper lobe and left base region. No consolidation. No appreciable adenopathy. There is aortic atherosclerosis. No bone lesions. IMPRESSION: Ill-defined airspace opacity in each upper lobe and left base regions. Appearance felt to be indicative of multifocal pneumonia. Question atypical organism pneumonia. Check of COVID-19 status advised. Cardiomegaly. Status post coronary artery bypass grafting. Apparent pacemaker via trans jugular approach with lead attached to right heart, likely right atrium. Aortic Atherosclerosis (ICD10-I70.0). Electronically Signed   By: Lowella Grip III M.D.   On: 05/10/2020 10:53   ECHOCARDIOGRAM COMPLETE  Result Date: 05/09/2020    ECHOCARDIOGRAM REPORT   Patient Name:  SHAIA PORATH Szafran Date of Exam: 05/09/2020 Medical Rec #:  [id number]      Height:       62.0 in Accession #:    [id number]     Weight:       163.0 lb Date of Birth:  Jan 08, 1951      BSA:          1.753 m Patient Age:    40 years       BP:           105/42 mmHg Patient Gender: F              HR:           46 bpm. Exam Location:  Inpatient Procedure: 2D Echo, Cardiac Doppler, Color Doppler and Intracardiac            Opacification Agent STAT ECHO Indications:    Complete heart block  History:        Patient has prior history of Echocardiogram examinations, most                 recent 01/24/2020. CHF, CAD, Prior CABG, PAD and COPD,                 Arrythmias:Atrial Flutter; Risk Factors:Diabetes, Hypertension                 and Dyslipidemia.  Sonographer:    Dustin Flock Referring Phys: [id number] [place]  1. Left ventricular ejection fraction, by estimation, is 35 to 40%. The left ventricle has moderately decreased function. The left ventricle demonstrates global hypokinesis and RWMA as noted in findings. There is mild left ventricular hypertrophy. Left ventricular diastolic parameters are indeterminate.  2. Respiratory related septal shift.  3. Right ventricular systolic function is  mildly reduced. The right ventricular size is normal. There is moderately elevated pulmonary artery systolic pressure. The estimated right ventricular systolic pressure is 52.8 mmHg.  4. Left atrial size was moderately dilated.  5. Right atrial size was moderately dilated.  6. Diastolic and systolic mitral valve regurgitation.. The mitral valve is grossly normal. Mild to moderate mitral valve regurgitation.  7. Tricuspid valve regurgitation is moderate.  8. The aortic valve is grossly normal. There is mild calcification of the aortic valve. Aortic valve regurgitation is not visualized. No aortic stenosis is present.  9. The inferior vena cava is dilated in size with <50% respiratory variability, suggesting right atrial pressure of 15 mmHg. FINDINGS  Left Ventricle: Left ventricular ejection fraction, by estimation, is 35 to 40%. The left ventricle has moderately decreased function. The left ventricle demonstrates global hypokinesis. Definity contrast agent was given IV to delineate the left ventricular endocardial borders. The left ventricular internal cavity size was normal in size. There is mild left ventricular hypertrophy. Abnormal (paradoxical) septal motion, consistent with left bundle branch block. Left ventricular diastolic parameters are indeterminate.  LV Wall Scoring: The inferior wall, posterior wall, and mid inferoseptal segment are akinetic. The mid anteroseptal segment is hypokinetic. Right Ventricle: The right ventricular size is normal. No increase in right ventricular wall thickness. Right ventricular systolic function is mildly reduced. There is moderately elevated pulmonary artery systolic pressure. The tricuspid regurgitant velocity is 3.08 m/s, and with an assumed right atrial pressure of 15 mmHg, the estimated right ventricular systolic pressure is 41.3 mmHg. Left Atrium: Left atrial size was moderately dilated. Right Atrium: Right atrial size was moderately dilated. Pericardium: There is no  evidence of pericardial effusion. Mitral  Valve: Diastolic and systolic mitral valve regurgitation. The mitral valve is grossly normal. Mild to moderate mitral valve regurgitation. MV peak gradient, 16.6 mmHg. The mean mitral valve gradient is 3.0 mmHg with average heart rate of 47 bpm. Tricuspid Valve: The tricuspid valve is normal in structure. Tricuspid valve regurgitation is moderate . No evidence of tricuspid stenosis. Aortic Valve: The aortic valve is grossly normal. There is mild calcification of the aortic valve. Aortic valve regurgitation is not visualized. No aortic stenosis is present. Aortic valve mean gradient measures 8.0 mmHg. Aortic valve peak gradient measures 19.0 mmHg. Aortic valve area, by VTI measures 3.12 cm. Pulmonic Valve: The pulmonic valve was normal in structure. Pulmonic valve regurgitation is trivial. Aorta: The aortic root is normal in size and structure. Venous: The inferior vena cava is dilated in size with less than 50% respiratory variability, suggesting right atrial pressure of 15 mmHg. IAS/Shunts: No atrial level shunt detected by color flow Doppler.  LEFT VENTRICLE PLAX 2D LVIDd:         5.20 cm  Diastology LVIDs:         4.70 cm  LV e' medial:    6.09 cm/s LV PW:         1.20 cm  LV E/e' medial:  26.3 LV IVS:        1.20 cm  LV e' lateral:   9.68 cm/s LVOT diam:     2.30 cm  LV E/e' lateral: 16.5 LV SV:         133 LV SV Index:   76 LVOT Area:     4.15 cm  RIGHT VENTRICLE RV Basal diam:  3.30 cm RV S prime:     9.79 cm/s TAPSE (M-mode): 2.1 cm LEFT ATRIUM             Index       RIGHT ATRIUM           Index LA diam:        4.90 cm 2.80 cm/m  RA Area:     22.90 cm LA Vol (A2C):   70.9 ml 40.46 ml/m RA Volume:   75.40 ml  43.02 ml/m LA Vol (A4C):   69.3 ml 39.54 ml/m LA Biplane Vol: 73.7 ml 42.05 ml/m  AORTIC VALVE                    PULMONIC VALVE AV Area (Vmax):    2.65 cm     PV Vmax:       1.86 m/s AV Area (Vmean):   2.78 cm     PV Peak grad:  13.8 mmHg AV Area (VTI):      3.12 cm AV Vmax:           218.00 cm/s AV Vmean:          131.000 cm/s AV VTI:            0.425 m AV Peak Grad:      19.0 mmHg AV Mean Grad:      8.0 mmHg LVOT Vmax:         139.00 cm/s LVOT Vmean:        87.700 cm/s LVOT VTI:          0.319 m LVOT/AV VTI ratio: 0.75  AORTA Ao Root diam: 2.90 cm MITRAL VALVE                TRICUSPID VALVE MV Area (PHT): 7.16 cm     TR  Peak grad:   37.9 mmHg MV Area VTI:   2.12 cm     TR Vmax:        308.00 cm/s MV Peak grad:  16.6 mmHg MV Mean grad:  3.0 mmHg     SHUNTS MV Vmax:       2.04 m/s     Systemic VTI:  0.32 m MV Vmean:      91.0 cm/s    Systemic Diam: 2.30 cm MV Decel Time: 106 msec MV E velocity: 160.00 cm/s MV A velocity: 131.00 cm/s MV E/A ratio:  1.22 Cherlynn Kaiser MD Electronically signed by Cherlynn Kaiser MD Signature Date/Time: 05/09/2020/5:23:45 PM    Final     Lab Data:  CBC: Recent Labs  Lab 05/09/20 1307 05/09/20 2039 05/09/20 2041 05/10/20 0102 05/11/20 0219 05/12/20 0401 05/13/20 0032  WBC 5.5 9.4  --   --  5.2 4.0 4.8  NEUTROABS 3.9  --   --   --   --   --   --   HGB 11.7* 12.0 12.9 12.6 10.5* 9.0* 9.8*  HCT 37.0 37.2 38.0 37.0 32.7* 27.2* 30.8*  MCV 96.6 94.4  --   --  94.5 94.4 96.3  PLT 67* 89*  --   --  50* 43* 44*   Basic Metabolic Panel: Recent Labs  Lab 05/09/20 1307 05/09/20 2039 05/09/20 2041 05/10/20 0102 05/10/20 0607 05/11/20 0219 05/12/20 0401 05/13/20 0032  NA 139 134*   < > 140 140 136 135 134*  K 4.3 4.6   < > 4.7 4.9 4.4 4.1 4.1  CL 100 98  --   --  103 99 100 100  CO2 23 24  --   --  26 27 27 23   GLUCOSE 111* 161*  --   --  111* 106* 91 87  BUN 36* 36*  --   --  37* 37* 37* 41*  CREATININE 2.97* 3.01*  --   --  2.79* 2.30* 1.83* 1.91*  CALCIUM 9.1 9.3  --   --  9.3 9.3 8.9 9.0  MG 1.9  --   --   --   --   --   --   --    < > = values in this interval not displayed.   GFR: Estimated Creatinine Clearance: 26.6 mL/min (A) (by C-G formula based on SCr of 1.91 mg/dL (H)). Liver Function  Tests: Recent Labs  Lab 05/09/20 1307 05/09/20 2039 05/12/20 0401  AST 52* 54* 45*  ALT 24 24 21   ALKPHOS 136* 136* 90  BILITOT 7.3* 9.3* 6.1*  PROT 6.7 7.1 6.0*  ALBUMIN 2.6* 2.7* 2.9*   No results for input(s): LIPASE, AMYLASE in the last 168 hours. No results for input(s): AMMONIA in the last 168 hours. Coagulation Profile: Recent Labs  Lab 05/10/20 1642 05/11/20 0219 05/12/20 0401 05/13/20 0032  INR 2.1* 2.2* 2.2* 2.2*   Cardiac Enzymes: No results for input(s): CKTOTAL, CKMB, CKMBINDEX, TROPONINI in the last 168 hours. BNP (last 3 results) No results for input(s): PROBNP in the last 8760 hours. HbA1C: No results for input(s): HGBA1C in the last 72 hours. CBG: No results for input(s): GLUCAP in the last 168 hours. Lipid Profile: No results for input(s): CHOL, HDL, LDLCALC, TRIG, CHOLHDL, LDLDIRECT in the last 72 hours. Thyroid Function Tests: No results for input(s): TSH, T4TOTAL, FREET4, T3FREE, THYROIDAB in the last 72 hours. Anemia Panel: No results for input(s): VITAMINB12, FOLATE, FERRITIN, TIBC, IRON, RETICCTPCT in the last 72  hours. Urine analysis:    Component Value Date/Time   COLORURINE AMBER (A) 04/12/2020 1822   APPEARANCEUR CLEAR 04/12/2020 1822   LABSPEC 1.015 04/12/2020 1822   PHURINE 6.0 04/12/2020 1822   GLUCOSEU NEGATIVE 04/12/2020 1822   HGBUR NEGATIVE 04/12/2020 1822   BILIRUBINUR NEGATIVE 04/12/2020 1822   KETONESUR NEGATIVE 04/12/2020 1822   PROTEINUR NEGATIVE 04/12/2020 1822   NITRITE NEGATIVE 04/12/2020 1822   LEUKOCYTESUR NEGATIVE 04/12/2020 1822     Ripudeep Rai M.D. [facility] 05/13/2020, 10:07 AM  Available via Epic secure chat 7am-7pm After 7 pm, please refer to night coverage provider listed on amion.

## 2020-05-13 NOTE — NC FL2 (Signed)
[place] LEVEL OF CARE SCREENING TOOL     IDENTIFICATION  Patient Name: Samantha [name] Birthdate: 01-09-1951 Sex: female Admission Date (Current Location): 05/09/2020  [facility] and [place] Number:  Herbalist and Address:  [facility] [place], [place] [address] [place], [place] [address]      Provider Number: [id number]  Attending Physician Name and Address:  Freada Bergeron, MD  Relative Name and Phone Number:  Ulice Dash [phone]    Current Level of Care: Hospital Recommended Level of Care: [place] Prior Approval Number:    Date Approved/Denied:   PASRR Number: [id number]  Discharge Plan: SNF    Current Diagnoses: Patient Active Problem List   Diagnosis Date Noted  . NSTEMI (non-ST elevated myocardial infarction) ([place])   . Pacemaker   . 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 Symptomatic bradycardia 05/09/2020  . Heart block AV complete ([place]) 05/09/2020  . Acute on chronic combined systolic and diastolic CHF (congestive heart failure) ([place]) 05/09/2020  . Paroxysmal ventricular tachycardia ([place]) - related to Bradycardia  05/09/2020  . L1 and L 2--Lumbar compression fracture, closed, initial encounter ([place]) 04/14/2020  . Chronic respiratory failure with hypoxia -CHF  04/13/2020  . Compression fracture of L2 lumbar vertebra ([place]) 04/12/2020  . Cholestasis, intrahepatic 03/12/2020  . Lobar pneumonia ([place]) 02/18/2020  . Hepatic cirrhosis ([place])   . Hypokalemia   . Anasarca 02/14/2020  . Cirrhosis of liver with ascites ([place]) 02/14/2020  . Volume overload 02/13/2020  . Constipation 02/11/2020  . Peripheral edema 02/03/2020  . Thrombocytopenia ([place]) 01/24/2020  . Left leg swelling 01/24/2020  . Hyperammonemia ([place]) 01/24/2020  . Prolonged QT interval 01/24/2020  . Elevated troponin 01/24/2020  . Intractable nausea and vomiting 01/23/2020  . Atypical chest pain 08/06/2019  . GERD (gastroesophageal reflux  disease) 08/06/2019  . Acute respiratory failure with hypoxia ([place]) 05/23/2019  . Abdominal pain 05/20/2019  . Intractable abdominal pain 05/19/2019  . Paroxysmal atrial fibrillation ([place]) 05/19/2019  . Anxiety with depression 05/19/2019  . Sinus arrhythmia 05/19/2019  . Iron deficiency anemia 02/05/2019  . Chronic combined systolic and diastolic CHF (congestive heart failure) ([place]) 04/17/2018  . Heart failure ([place]) 04/17/2018  . Osteoarthritis of spine with radiculopathy, cervical region 01/12/2018  . Paresthesia of both hands 12/14/2017  . Recurrent falls 12/14/2017  . Closed compression fracture of first lumbar vertebra ([place]) 08/23/2017  . Lumbar spondylosis with myelopathy 08/23/2017  . Carpal tunnel syndrome of right wrist 04/05/2017  . Rectal pain 03/06/2017  . Influenza A (H1N1) 02/22/2017  . Rectal bleeding 02/22/2017  . Closed fracture of lower end of right radius with routine healing 02/20/2017  . Injury of triangular fibrocartilage complex of right wrist 02/20/2017  . Pain 02/10/2017  . Other cirrhosis of liver ([place]) 10/26/2016  . Persistent atrial fibrillation 08/16/2016  . Cardiomyopathy, ischemic-35-40% by cath  01/11/2015  . Obesity 09/24/2013  . Unspecified hereditary and idiopathic peripheral neuropathy 01/10/2013  . Hereditary and idiopathic peripheral neuropathy 01/10/2013  . Cirrhosis, non-alcoholic ([place]) 84/69/6295  . Esophageal varices- Plavix stopped 12/23/2011  . GI bleed 12/23/2011  . Idiopathic esophageal varices without bleeding ([place]) 12/23/2011  . Anemia 04/22/2011  . Coronary artery disease involving native coronary artery of native heart with angina pectoris ([place]) 08/21/2009  . Mixed hyperlipidemia 06/04/2008  . Essential hypertension 06/04/2008  . S/P CABG x 3- 2001 06/04/2008    Orientation RESPIRATION BLADDER Height & Weight     Self,Time,Situation,Place  Normal Incontinent,External catheter (  External Urinary Catheter) Weight: 177 lb 14.6 oz  (80.7 kg) Height:  5' 2"  (157.5 cm)  BEHAVIORAL SYMPTOMS/MOOD NEUROLOGICAL BOWEL NUTRITION STATUS      Incontinent Diet (See Discharge Summary)  AMBULATORY STATUS COMMUNICATION OF NEEDS Skin   Limited Assist Verbally Other (Comment) (Incision (closed) chest left;upper,wound/incision (open or dehiced) (ITD) Intertriginous Dermatitis Abdomen,Right,Left,Anterior)                       Personal Care Assistance Level of Assistance  Bathing,Feeding,Dressing Bathing Assistance: Limited assistance Feeding assistance: Independent Dressing Assistance: Limited assistance     Functional Limitations Info  Sight,Hearing,Speech Sight Info: Adequate Hearing Info: Adequate Speech Info: Adequate    SPECIAL CARE FACTORS FREQUENCY  PT (By licensed PT),OT (By licensed OT)     PT Frequency: 5x min weekly OT Frequency: 5x min weekly            Contractures Contractures Info: Not present    Additional Factors Info  Code Status,Allergies Code Status Info: DNR Allergies Info: Entresto (sacubitril-valsartan),Vibra-tab (doxycycline),Acetaminophen,Ace Inhibitors,Cefaclor,Cephalexin,Penicillins,Pregabalin,Tape           Current Medications (05/13/2020):  This is the current hospital active medication list Current Facility-Administered Medications  Medication Dose Route Frequency Provider Last Rate Last Admin  . 0.9 %  sodium chloride infusion  250 mL Intravenous PRN Leonie Man, MD      . 0.9 %  sodium chloride infusion  250 mL Intravenous PRN Leonie Man, MD 999 mL/hr at 05/11/20 1646 Rate Change at 05/11/20 1646  . acetaminophen (TYLENOL) tablet 650 mg  650 mg Oral Q4H PRN Leonie Man, MD   650 mg at 05/12/20 1959  . albuterol (VENTOLIN HFA) 108 (90 Base) MCG/ACT inhaler 2 puff  2 puff Inhalation Q6H PRN Leonie Man, MD      . Ampicillin-Sulbactam (UNASYN) 3 g in sodium chloride 0.9 % 100 mL IVPB  3 g Intravenous Q12H Lyndee Leo, RPH 200 mL/hr at 05/13/20 1250 3 g  at 05/13/20 1250  . Chlorhexidine Gluconate Cloth 2 % PADS 6 each  6 each Topical Daily Leonie Man, MD   6 each at 05/13/20 [phone]  . DOPamine (INTROPIN) 800 mg in dextrose 5 % 250 mL (3.2 mg/mL) infusion  0-20 mcg/kg/min Intravenous Continuous Dion Body, MD   Stopped at 05/11/20 1438  . DULoxetine (CYMBALTA) DR capsule 20 mg  20 mg Oral QHS Earlie Counts, NP      . ezetimibe (ZETIA) tablet 10 mg  10 mg Oral QHS Leonie Man, MD   10 mg at 05/12/20 2121  . guaiFENesin (MUCINEX) 12 hr tablet 600 mg  600 mg Oral BID Wynetta Fines T, MD   600 mg at 05/13/20 0905  . ipratropium (ATROVENT) 0.06 % nasal spray 1 spray  1 spray Each Nare Daily PRN Leonie Man, MD      . lidocaine (LIDODERM) 5 % 1 patch  1 patch Transdermal Q24H Leonie Man, MD   1 patch at 05/12/20 1656  . methocarbamol (ROBAXIN) tablet 500 mg  500 mg Oral TID Leonie Man, MD   500 mg at 05/13/20 0905  . metoprolol tartrate (LOPRESSOR) tablet 12.5 mg  12.5 mg Oral BID Chandrasekhar, Mahesh A, MD   12.5 mg at 05/13/20 0905  . oxyCODONE (Oxy IR/ROXICODONE) immediate release tablet 5 mg  5 mg Oral Q4H PRN Earlie Counts, NP      . oxyCODONE (OXYCONTIN) 12 hr  tablet 20 mg  20 mg Oral Q12H Earlie Counts, NP   20 mg at 05/13/20 1332  . pantoprazole (PROTONIX) EC tablet 40 mg  40 mg Oral Daily Leonie Man, MD   40 mg at 05/13/20 0905  . pneumococcal 23 valent vaccine (PNEUMOVAX-23) injection 0.5 mL  0.5 mL Intramuscular Prior to discharge Freada Bergeron, MD      . senna-docusate (Senokot-S) tablet 2 tablet  2 tablet Oral QHS Leonie Man, MD   2 tablet at 05/12/20 2121  . sodium chloride flush (NS) 0.9 % injection 10-40 mL  10-40 mL Intracatheter Q12H Leonie Man, MD   10 mL at 05/13/20 0905  . sodium chloride flush (NS) 0.9 % injection 10-40 mL  10-40 mL Intracatheter PRN Leonie Man, MD      . sodium chloride flush (NS) 0.9 % injection 3 mL  3 mL Intravenous Q12H Leonie Man, MD   3  mL at 05/13/20 0906  . sodium chloride flush (NS) 0.9 % injection 3 mL  3 mL Intravenous PRN Leonie Man, MD   3 mL at 05/11/20 0946  . sodium chloride flush (NS) 0.9 % injection 3 mL  3 mL Intravenous Q12H Leonie Man, MD   3 mL at 05/13/20 0906  . sodium chloride flush (NS) 0.9 % injection 3 mL  3 mL Intravenous PRN Leonie Man, MD   3 mL at 05/10/20 2103     Discharge Medications: Please see discharge summary for a list of discharge medications.  Relevant Imaging Results:  Relevant Lab Results:   Additional Information [id number]  Trula Ore, LCSWA

## 2020-05-13 NOTE — Progress Notes (Signed)
Progress Note  Patient Name: Samantha [name] Date of Encounter: 05/13/2020  [facility] Cardiologist: Sherren Mocha, MD   Subjective   No issues overnight.  SNF recommend by both PT and OT.  Notes back pain and arm pain that she attributes to being in the bed.  Is trying to walk; does not want to return to a nursing home.  Inpatient Medications    Scheduled Meds: . Chlorhexidine Gluconate Cloth  6 each Topical Daily  . ezetimibe  10 mg Oral QHS  . guaiFENesin  600 mg Oral BID  . lidocaine  1 patch Transdermal Q24H  . methocarbamol  500 mg Oral TID  . metoprolol tartrate  12.5 mg Oral BID  . pantoprazole  40 mg Oral Daily  . senna-docusate  2 tablet Oral QHS  . sodium chloride flush  10-40 mL Intracatheter Q12H  . sodium chloride flush  3 mL Intravenous Q12H  . sodium chloride flush  3 mL Intravenous Q12H   Continuous Infusions: . sodium chloride    . sodium chloride 999 mL/hr at 05/11/20 1646  . ampicillin-sulbactam (UNASYN) IV 3 g (05/12/20 2335)  . DOPamine Stopped (05/11/20 1438)   PRN Meds: sodium chloride, sodium chloride, acetaminophen, albuterol, ipratropium, oxyCODONE, pneumococcal 23 valent vaccine, sodium chloride flush, sodium chloride flush, sodium chloride flush   Vital Signs    Vitals:   05/13/20 0300 05/13/20 0400 05/13/20 0500 05/13/20 0800  BP:  (!) 100/50  90/71  Pulse:  66  92  Resp:  15  20  Temp: 98.4 F (36.9 C)     TempSrc: Oral   Oral  SpO2:  94%  100%  Weight:   80.7 kg   Height:        Intake/Output Summary (Last 24 hours) at 05/13/2020 1015 Last data filed at 05/13/2020 0500 Gross per 24 hour  Intake 722.59 ml  Output 500 ml  Net 222.59 ml   Last 3 Weights 05/13/2020 05/12/2020 05/11/2020  Weight (lbs) 177 lb 14.6 oz 175 lb 7.8 oz 175 lb 14.8 oz  Weight (kg) 80.7 kg 79.6 kg 79.8 kg      Telemetry    A fib with PVCs occasional V pacing - Personally Reviewed  ECG    No new tracing last 24 hours - Personally  Reviewed  Physical Exam   GEN: Chronically ill appearing, comfortable and NAD Neck: TVP site c/d/i Cardiac: RRR, II/VI holosystolic murmur without rubs, or gallops  Respiratory: Poor air movement in bases, no crackles on exam  GI: Soft, nontender, non-distended  MS: Trace pedal edema; warm; toe nails painted red Neuro:  Nonfocal  Psych: Normal affect  SKIN:  No pocked hematoma  Labs    High Sensitivity Troponin:   Recent Labs  Lab 05/09/20 1307 05/09/20 1506  TROPONINIHS 81* 86*      Chemistry Recent Labs  Lab 05/09/20 1307 05/09/20 2039 05/09/20 2041 05/11/20 0219 05/12/20 0401 05/13/20 0032  NA 139 134*   < > 136 135 134*  K 4.3 4.6   < > 4.4 4.1 4.1  CL 100 98   < > 99 100 100  CO2 23 24   < > 27 27 23   GLUCOSE 111* 161*   < > 106* 91 87  BUN 36* 36*   < > 37* 37* 41*  CREATININE 2.97* 3.01*   < > 2.30* 1.83* 1.91*  CALCIUM 9.1 9.3   < > 9.3 8.9 9.0  PROT 6.7 7.1  --   --  6.0*  --   ALBUMIN 2.6* 2.7*  --   --  2.9*  --   AST 52* 54*  --   --  45*  --   ALT 24 24  --   --  21  --   ALKPHOS 136* 136*  --   --  90  --   BILITOT 7.3* 9.3*  --   --  6.1*  --   GFRNONAA 16* 16*   < > 22* 29* 28*  ANIONGAP 16* 12   < > 10 8 11    < > = values in this interval not displayed.     Hematology Recent Labs  Lab 05/11/20 0219 05/12/20 0401 05/13/20 0032  WBC 5.2 4.0 4.8  RBC 3.46* 2.88* 3.20*  HGB 10.5* 9.0* 9.8*  HCT 32.7* 27.2* 30.8*  MCV 94.5 94.4 96.3  MCH 30.3 31.3 30.6  MCHC 32.1 33.1 31.8  RDW 17.5* 17.4* 18.3*  PLT 50* 43* 44*    BNP Recent Labs  Lab 05/09/20 1307  BNP 488.0*     DDimer No results for input(s): DDIMER in the last 168 hours.   Radiology    DG Chest 2 View  Result Date: 05/12/2020 CLINICAL DATA:  Post pacer placement EXAM: CHEST - 2 VIEW COMPARISON:  05/11/2020 FINDINGS: Interval placement of left chest wall dual lead pacemaker. Leads overlie the right atrium and right ventricle. No pneumothorax. Removal of temporary  pacemaker with right IJ introducer remaining present. Similar mild pulmonary edema and cardiomegaly. IMPRESSION: Post pacemaker placement without complication. Similar mild pulmonary edema and cardiomegaly. Electronically Signed   By: Macy Mis M.D.   On: 05/12/2020 08:15    Cardiac Studies   Echo 01/24/20: 1. Left ventricular ejection fraction, by estimation, is 35 to 40%. The  left ventricle has moderately decreased function. The left ventricle  demonstrates global hypokinesis. Left ventricular diastolic function could  not be evaluated. There is severe  hypokinesis of the left ventricular, entire inferior wall and  inferolateral wall.  2. Right ventricular systolic function is mildly reduced. The right  ventricular size is normal. There is mildly elevated pulmonary artery  systolic pressure.  3. Left atrial size was severely dilated.  4. Right atrial size was severely dilated.  5. The mitral valve is normal in structure. Mild to moderate mitral valve  regurgitation.  6. Tricuspid valve regurgitation is moderate.  7. The aortic valve is tricuspid. Aortic valve regurgitation is not  visualized. Mild aortic valve sclerosis is present, with no evidence of  aortic valve stenosis.  8. The inferior vena cava is normal in size with <50% respiratory  variability, suggesting right atrial pressure of 8 mmHg.   Patient Profile     70 y.o. female with history of multivessel CAD status post CABG in 2001 with subsequent PCIs, nonalcoholic cirrhosis with esophageal varices and thrombocytopenia, heart failure with reduced ejection fraction as low as 20 to 35%, HTN, HLD, Afib not on AC due to cirrhosis and varices, SSS, COPD on home O2 who presented to [facility] with an episode of unresponsiveness found to be profoundly bradycardic in the field initially responsive to atropine. On arrival to [facility], reportedly had episode of accelerated idioventricular rhythm-->10s pause in which CPR  was almost initiated and then she developed a bradycardic rhythm consistent with CHB. Subsequently transferred to [facility] now s/p TVP placement.  Assessment & Plan     #HFrEF 35% # Mitral Regurgitation (mild to moderate) # AKI on CKI -  Secondary to shock -TTE with stable LVEF 28-76%, diastolic and systolic MR and TR  Creatinine stable from 05/12/20; this may be new baseline - holding MRA and torsemide; with this patient is still on RA - encourage oral intake but no IVF  Long term poor prognosis and Chronic Pain - unable to safely use NSAIDs - Palliative Care consulted 05/12/20; appreciate recs  #Multivessel CAD s/p CABG in 2001 with subsequent PCI: # HLD -NO ASA or Plavix with given thrombocytopenia and cirrhosis - stable platelets -GDMT limited due to cirrhosis and thrombocytopenia  -Continue zetia 78m daily  #Paroxysmal Afib: Not on AC due to high risk of bleed with cirrhosis and thrombocytopenia. -No AC -rates controled on low dose BB  #Non-alcoholic cirrhosis: Complicated by varices and thrombocytopenia. -Trend plt -No AC or DAPT at this time  PNA- Continuing 5 day Unasyn course - medicine consulted, appreciate recs  #COPD on home O2: -Continue supplemental O2 as needed -Inhalers prn  #Complete Heart Block: #Accelerated Idioventricular Rhythm: -EP consulted, placed DC PPM; has presently signed off  Full Code  Cardiac Diet Labs ordered SCDs for DVT prophylaxis Planned to transfer to floor today (ordered since 05/12/20) SNF Planning started  For questions or updates, please contact [facility] Please consult [email or web address] for contact info under        Signed, MWerner Lean MD  05/13/2020, 10:15 AM

## 2020-05-13 NOTE — Consult Note (Signed)
Consultation Note Date: 05/13/2020   Patient Name: Samantha [name]  DOB: 06/17/1950  MRN: [id number]  Age / Sex: 70 y.o., female  PCP: [name], MD Referring Physician: Freada Bergeron, MD  Reason for Consultation: Establishing goals of care  HPI/Patient Profile: 70 y.o. female  with past medical history of multivessel CAD status post CABG 7371, nonalcoholic hepatic cirrhosis with the chart for GU varices and thrombocytopenia, HFrEF with EF 35%, hypertension, hyperlipidemia, permanent atrial fibrillation not on anticoagulation, COPD on home oxygen admitted on 05/09/2020 with symptomatic bradycardia found to have complete heart block.  Patient was transferred to the ICU on dopamine drip and had a pacemaker placed.  Patient currently off of dopamine drip and will be transferred to a stepdown unit.  Palliative team is consulted for goal of care conversation.  Clinical Assessment and Goals of Care: I have reviewed medical record from progress notes, labs and imaging.  We met patient, her brother Ulice Dash and sister-in-law Quita Skye in the room.  Dr. Gasper Sells was in the room and went over patient's medical conditions and poor prognosis.  We introduced ourselves as palliative medicine, which is a specialized medical team focusing on symptom management for patient with serious illnesses.  Quita Skye was able to give a brief summary of patient's medical conditions and they seem to understand her overall poor prognosis.  When asked about long-term goal, patient states that she would like to walk again.  Patient had a recent compression fracture that caused excruciating pain of her back as well as her leg.  She states that pain is shooting in nature, associated with burning of bilateral feet.  Patient is currently on oxycodone which helped alleviate some of her symptoms but she has breakthrough pain frequently.  Patient was able  to walk with a rolling walker last Friday but has not been able to walk again during this admission.  Patient expressed that she does not want to be a burden to her family.  Ulice Dash and Quita Skye state that they are prepared to bring patient home with them.  We explained to patient and family that there are 2 reasonable plans for discharge.  One is patient will go to a short-term nursing facility for physical therapy and go home with Ulice Dash and Quita Skye after.  The other option is home with hospice services.  Patient favored the nursing facility because she wants to regain her ability to ambulate.  Patient wishes not to go back to [facility] facility.  Patient also expressed changing the CODE STATUS to DNR.   All questions and concerns was answered  Primary Decision Maker PATIENT    SUMMARY OF RECOMMENDATIONS   -CODE STATUS changed to DNR -She will go to a SNF and then go home with her brother -She already has outpatient palliative at home.  Can transition to hospice care if appropriate -To better control her pain, will start long-acting OxyContin 20 mg twice daily.  Add oxycodone 5 mg every 4 hours as needed for breakthrough pain -Add Cymbalta  for neuropathic pain and also her mood.  Patient was on pregabalin in the past but it worsened her peripheral edema   Code Status/Advance Care Planning:  DNR    Symptom Management:   OxyContin and oxycodone as needed  Palliative Prophylaxis:   Bowel Regimen  Additional Recommendations (Limitations, Scope, Preferences):  Avoid Hospitalization   Prognosis:    < 6 months  Discharge Planning: [place] for rehab with Palliative care service follow-up  Primary Diagnoses: Present on Admission: . Symptomatic bradycardia . Coronary artery disease involving native coronary artery of native heart with angina pectoris ([place]) . Cirrhosis, non-alcoholic ([place]) . Persistent atrial fibrillation . Cardiomyopathy, ischemic-35-40% by  cath  . Essential hypertension . Heart block AV complete ([place]) . Acute on chronic combined systolic and diastolic CHF (congestive heart failure) ([place])   I have reviewed the medical record, interviewed the patient and family, and examined the patient. The following aspects are pertinent.  Past Medical History:  Diagnosis Date  . Cataract    OU  . Chronic combined systolic and diastolic CHF (congestive heart failure) ([place])   . Cirrhosis of liver ([place])   . CKD (chronic kidney disease), stage III ([place])   . Coronary artery disease    a. s/p CABG 2001 w/ (LIMA-OM, SVG-D1, SVG-RCA). b. h/o multiple PCIs per Dr. Antionette Char note.  . Depression   . Esophageal varices ([place])    New 2013  . Gastroesophageal reflux disease   . History of pneumonia   . Hyperlipidemia   . Hypertension   . Hypertensive retinopathy    OU  . Obesity   . Osteoarthritis   . Pancytopenia ([place])   . Paroxysmal atrial flutter ([place])    a. dx 05/2016.  [name] Persistent atrial fibrillation ([place])    a. reported in hosp 07/2016, not on anticoag due to cirrhosis and liver disease, low platelets, varices.  . Thrombocytopenia ([place])    Social History   Socioeconomic History  . Marital status: Divorced    Spouse name: Not on file  . Number of children: Not on file  . Years of education: Not on file  . Highest education level: Not on file  Occupational History  . Occupation: Disabled  Tobacco Use  . Smoking status: Former Smoker    Packs/day: 0.50    Years: 20.00    Pack years: 10.00    Types: Cigarettes    Quit date: 07/21/1995    Years since quitting: 24.8  . Smokeless tobacco: Never Used  Vaping Use  . Vaping Use: Never used  Substance and Sexual Activity  . Alcohol use: No  . Drug use: No  . Sexual activity: Not on file  Other Topics Concern  . Not on file  Social History Narrative   Lives in [place] by herself.     Social Determinants of Health   Financial Resource Strain: Not on file  Food Insecurity: Not  on file  Transportation Needs: Not on file  Physical Activity: Not on file  Stress: Not on file  Social Connections: Not on file   Scheduled Meds: . Chlorhexidine Gluconate Cloth  6 each Topical Daily  . ezetimibe  10 mg Oral QHS  . guaiFENesin  600 mg Oral BID  . lidocaine  1 patch Transdermal Q24H  . methocarbamol  500 mg Oral TID  . metoprolol tartrate  12.5 mg Oral BID  . pantoprazole  40 mg Oral Daily  . senna-docusate  2 tablet Oral QHS  . sodium chloride  flush  10-40 mL Intracatheter Q12H  . sodium chloride flush  3 mL Intravenous Q12H  . sodium chloride flush  3 mL Intravenous Q12H   Continuous Infusions: . sodium chloride    . sodium chloride 999 mL/hr at 05/11/20 1646  . ampicillin-sulbactam (UNASYN) IV 3 g (05/12/20 2335)  . DOPamine Stopped (05/11/20 1438)   PRN Meds:.sodium chloride, sodium chloride, acetaminophen, albuterol, ipratropium, oxyCODONE, pneumococcal 23 valent vaccine, sodium chloride flush, sodium chloride flush, sodium chloride flush Medications Prior to Admission:  Prior to Admission medications   Medication Sig Start Date End Date Taking? Authorizing Provider  albuterol (PROVENTIL HFA;VENTOLIN HFA) 108 (90 BASE) MCG/ACT inhaler Inhale 2 puffs into the lungs every 6 (six) hours as needed for wheezing or shortness of breath.    Yes [provider]  ezetimibe (ZETIA) 10 MG tablet Take 10 mg by mouth at bedtime.   Yes [provider]  FLUoxetine (PROZAC) 20 MG capsule Take 20 mg by mouth daily.    Yes [provider]  ipratropium (ATROVENT) 0.03 % nasal spray Place 1 spray into both nostrils daily as needed (allergies).  12/23/19  Yes [provider]  metoprolol tartrate (LOPRESSOR) 25 MG tablet Take 0.5 tablets (12.5 mg total) by mouth daily as needed (palpitations). Only take if your heart rate is over 120 beats per minute. 03/31/20  Yes Weaver, Scott T, PA-C  NITROSTAT 0.4 MG SL tablet Place 0.4 mg under the tongue every  5 (five) minutes as needed for chest pain (x 3 tabs daily).  11/15/13  Yes [provider]  ondansetron (ZOFRAN ODT) 4 MG disintegrating tablet Take 1 tablet (4 mg total) by mouth every 8 (eight) hours as needed for nausea or vomiting. 02/04/20  Yes Shah, Pratik D, DO  Oxycodone HCl 10 MG TABS Take 1 tablet (10 mg total) by mouth every 4 (four) hours as needed. Patient taking differently: Take 10 mg by mouth every 4 (four) hours as needed (pain). 04/15/20  Yes Emokpae, Courage, MD  pantoprazole (PROTONIX) 40 MG tablet Take 40 mg by mouth daily.   Yes [provider]  polyethylene glycol (MIRALAX / GLYCOLAX) 17 g packet Take 17 g by mouth daily. 03/12/20  Yes Rehman, Mechele Dawley, MD  potassium chloride SA (KLOR-CON) 20 MEQ tablet Take 40 mEq by mouth daily.   Yes [provider]  spironolactone (ALDACTONE) 25 MG tablet Take 25 mg by mouth daily.   Yes [provider]  torsemide (DEMADEX) 20 MG tablet Take 40 mg by mouth daily.   Yes [provider]  lidocaine (LIDODERM) 5 % Place 1 patch onto the skin daily. Remove & Discard patch within 12 hours or as directed by MD Patient not taking: No sig reported 04/15/20   Roxan Hockey, MD  methocarbamol (ROBAXIN) 500 MG tablet Take 1 tablet (500 mg total) by mouth 3 (three) times daily. 04/15/20   Roxan Hockey, MD  senna-docusate (SENOKOT-S) 8.6-50 MG tablet Take 2 tablets by mouth at bedtime. Patient not taking: No sig reported 04/15/20 04/15/21  Roxan Hockey, MD   Allergies  Allergen Reactions  . Entresto [Sacubitril-Valsartan] Swelling    Swelling of eyes  . Vibra-Tab [Doxycycline] Nausea Only  . Acetaminophen Other (See Comments)    Liver problems  . Ace Inhibitors Other (See Comments)    UNSURE OF REACTION TYPE   . Cefaclor Itching and Rash    Pt tolerates meropenem  . Cephalexin Itching and Rash    Pt tolerates meropenem  .  Penicillins Rash    Has patient had a PCN reaction causing immediate  rash, facial/tongue/throat swelling, SOB or lightheadedness with hypotension: YES Has patient had a PCN reaction causing severe rash involving mucus membranes or skin necrosis: NO Has patient had a PCN reaction that required hospitalization: YES Has patient had a PCN reaction occurring within the last 10 years: NO If all of the above answers are "NO", then may proceed with Cephalosporin use. Pt tolerates meropenem 02/19/20   . Pregabalin Other (See Comments)    Retains fluid  . Tape Itching and Rash    Takes skin right off with medical tape--PAPER TAPE ONLY   Review of Systems  Respiratory: Negative for shortness of breath.   Cardiovascular: Positive for leg swelling.  Musculoskeletal: Positive for back pain.  Psychiatric/Behavioral: Negative for agitation, behavioral problems and confusion.    Physical Exam Constitutional:      General: She is not in acute distress.    Appearance: She is ill-appearing.  HENT:     Head: Normocephalic.  Pulmonary:     Effort: No respiratory distress.     Breath sounds: No wheezing.  Musculoskeletal:     Right lower leg: Edema (+1) present.     Left lower leg: Edema (+1) present.  Skin:    General: Skin is warm.  Neurological:     Mental Status: She is alert.     Vital Signs: BP (!) 113/56   Pulse 93   Temp 98.3 F (36.8 C) (Oral)   Resp (!) 24   Ht _0  (1.575 m)   Wt 80.7 kg   LMP 03/29/2011   SpO2 100%   BMI 32.54 kg/m  Pain Scale: 0-10 POSS *See Group Information*: S-Acceptable,Sleep, easy to arouse Pain Score: 0-No pain   SpO2: SpO2: 100 % O2 Device:SpO2: 100 % O2 Flow Rate: .O2 Flow Rate (L/min): 2 L/min  IO: Intake/output summary:   Intake/Output Summary (Last 24 hours) at 05/13/2020 1246 Last data filed at 05/13/2020 0500 Gross per 24 hour  Intake 343 ml  Output 500 ml  Net -157 ml    LBM: Last BM Date:  (PTA) Baseline Weight: Weight: 73.9 kg Most recent weight: Weight: 80.7 kg     Palliative  Assessment/Data: 50%     Thank you for this consult. Palliative medicine will continue to follow and assist as needed.   Time In: 10:30 Time Out: 12:08 Time Total: 98 min Greater than 50%  of this time was spent counseling and coordinating care related to the above assessment and plan.  Signed by: Mariana Kaufman, AGNP-C Palliative Medicine  Gaylan Gerold, DO Internal Medicine    Please contact Palliative Medicine Team phone at [phone] for questions and concerns.  For individual provider: See Shea Evans

## 2020-05-14 DIAGNOSIS — K7460 Unspecified cirrhosis of liver: Secondary | ICD-10-CM | POA: Diagnosis not present

## 2020-05-14 DIAGNOSIS — I5022 Chronic systolic (congestive) heart failure: Secondary | ICD-10-CM | POA: Insufficient documentation

## 2020-05-14 DIAGNOSIS — I5043 Acute on chronic combined systolic (congestive) and diastolic (congestive) heart failure: Secondary | ICD-10-CM | POA: Diagnosis not present

## 2020-05-14 DIAGNOSIS — I442 Atrioventricular block, complete: Secondary | ICD-10-CM | POA: Diagnosis not present

## 2020-05-14 DIAGNOSIS — R570 Cardiogenic shock: Secondary | ICD-10-CM | POA: Diagnosis not present

## 2020-05-14 DIAGNOSIS — Z950 Presence of cardiac pacemaker: Secondary | ICD-10-CM | POA: Diagnosis not present

## 2020-05-14 DIAGNOSIS — Z7189 Other specified counseling: Secondary | ICD-10-CM | POA: Diagnosis not present

## 2020-05-14 LAB — PROTIME-INR
INR: 1.9 — ABNORMAL HIGH (ref 0.8–1.2)
Prothrombin Time: 21.3 seconds — ABNORMAL HIGH (ref 11.4–15.2)

## 2020-05-14 LAB — CBC
HCT: 29.2 % — ABNORMAL LOW (ref 36.0–46.0)
Hemoglobin: 9.9 g/dL — ABNORMAL LOW (ref 12.0–15.0)
MCH: 31.4 pg (ref 26.0–34.0)
MCHC: 33.9 g/dL (ref 30.0–36.0)
MCV: 92.7 fL (ref 80.0–100.0)
Platelets: 54 10*3/uL — ABNORMAL LOW (ref 150–400)
RBC: 3.15 MIL/uL — ABNORMAL LOW (ref 3.87–5.11)
RDW: 18.5 % — ABNORMAL HIGH (ref 11.5–15.5)
WBC: 6 10*3/uL (ref 4.0–10.5)
nRBC: 0 % (ref 0.0–0.2)

## 2020-05-14 LAB — BASIC METABOLIC PANEL
Anion gap: 10 (ref 5–15)
BUN: 38 mg/dL — ABNORMAL HIGH (ref 8–23)
CO2: 25 mmol/L (ref 22–32)
Calcium: 9 mg/dL (ref 8.9–10.3)
Chloride: 99 mmol/L (ref 98–111)
Creatinine, Ser: 1.51 mg/dL — ABNORMAL HIGH (ref 0.44–1.00)
GFR, Estimated: 37 mL/min — ABNORMAL LOW (ref >60)
Glucose, Bld: 99 mg/dL (ref 70–99)
Potassium: 3.9 mmol/L (ref 3.5–5.1)
Sodium: 134 mmol/L — ABNORMAL LOW (ref 135–145)

## 2020-05-14 MED ORDER — MORPHINE SULFATE (PF) 4 MG/ML IV SOLN: 4.0000 mg | Freq: Once | INTRAVENOUS | Status: DC

## 2020-05-14 MED ORDER — MORPHINE SULFATE (PF) 4 MG/ML IV SOLN
4.0000 mg | Freq: Once | INTRAVENOUS | Status: AC
Start: 2020-05-14 — End: 2020-05-14
  Administered 2020-05-14: 2 mg via INTRAVENOUS
  Filled 2020-05-14: qty 1

## 2020-05-14 MED ORDER — NITROGLYCERIN 0.4 MG SL SUBL
SUBLINGUAL_TABLET | SUBLINGUAL | Status: AC
  Administered 2020-05-14: 0.4 mg
  Filled 2020-05-14: qty 1

## 2020-05-14 NOTE — TOC Progression Note (Signed)
Transition of Care [facility]) - Progression Note    Patient Details  Name: Samantha [name] MRN: [id number] Date of Birth: [birth year]  Transition of Care [facility]) CM/SW [place], [place] Phone Number: 05/14/2020, 4:20 PM  Clinical Narrative:     CSW spoke with patient at bedside and provided SNF bed offers. Patient has chosen SNF placement at [place] [facility] for [facility]. CSW spoke with son regarding palliative referral to SNF .Patients son Ulice Dash confirmed that patient currently recieves palliative services at home with [facility]. Patients son would like for CSW to make referral to [facility] for palliative services to follow patient at [facility].CSW called and made referral to [place] and spoke with [place]. Colletta Maryland confirmed they will provide palliative services to patient at [facility]. CSW let Colletta Maryland know we will follow up with her and let her know when patient is medically ready to transition to [facility].  Patient has SNF bed at [facility] with palliative services to follow. CSW will need to start insurance authorization close to patient being medically ready.   Expected Discharge Plan: [place] Barriers to Discharge: Continued Medical Work up  Expected Discharge Plan and Services Expected Discharge Plan: [place] In-house Referral: Clinical Social Work     Living arrangements for the past 2 months: Single Family Home                                       Social Determinants of Health (SDOH) Interventions    Readmission Risk Interventions Readmission Risk Prevention Plan 05/13/2020 02/19/2020  PCP or Specialist Appt within 3-5 Days - Complete  SW Recovery Care/Counseling Consult Complete -  [place] Complete -  Some recent data might be hidden

## 2020-05-14 NOTE — Progress Notes (Signed)
Progress Note  Patient Name: Samantha [name] Date of Encounter: 05/14/2020  [facility] Cardiologist: Sherren Mocha, MD   Subjective   In interim had long discussion with patient and family of goals of care; palliative care joined in this conversation.  Transitioned to DNR as part of discussion.  Patient feels like her pain is slightly improved.  Shakiness has decreased.  She walked some with therapy yesterday.  Inpatient Medications    Scheduled Meds: . Chlorhexidine Gluconate Cloth  6 each Topical Daily  . DULoxetine  20 mg Oral QHS  . ezetimibe  10 mg Oral QHS  . guaiFENesin  600 mg Oral BID  . lidocaine  1 patch Transdermal Q24H  . methocarbamol  500 mg Oral TID  . metoprolol tartrate  12.5 mg Oral BID  . oxyCODONE  20 mg Oral Q12H  . pantoprazole  40 mg Oral Daily  . senna-docusate  2 tablet Oral QHS  . sodium chloride flush  10-40 mL Intracatheter Q12H  . sodium chloride flush  3 mL Intravenous Q12H  . sodium chloride flush  3 mL Intravenous Q12H   Continuous Infusions: . sodium chloride    . sodium chloride 999 mL/hr at 05/11/20 1646  . ampicillin-sulbactam (UNASYN) IV 3 g (05/13/20 2327)  . DOPamine Stopped (05/11/20 1438)   PRN Meds: sodium chloride, sodium chloride, acetaminophen, albuterol, ipratropium, oxyCODONE, pneumococcal 23 valent vaccine, sodium chloride flush, sodium chloride flush, sodium chloride flush   Vital Signs    Vitals:   05/14/20 0400 05/14/20 0500 05/14/20 0600 05/14/20 0700  BP: (!) 118/54 (!) 118/56 (!) 120/53   Pulse: 75 92 84 90  Resp: 18 17 16 10   Temp:      TempSrc:      SpO2: 96% 98% 96% 100%  Weight:  81.8 kg    Height:        Intake/Output Summary (Last 24 hours) at 05/14/2020 0733 Last data filed at 05/14/2020 0000 Gross per 24 hour  Intake 650 ml  Output 500 ml  Net 150 ml   Last 3 Weights 05/14/2020 05/13/2020 05/12/2020  Weight (lbs) 180 lb 5.4 oz 177 lb 14.6 oz 175 lb 7.8 oz  Weight (kg) 81.8 kg 80.7 kg 79.6 kg       Telemetry    AF to SR (~ 4 AM) to AF; some A pacing - Personally Reviewed  ECG    No New last 24 hours - Personally Reviewed  Physical Exam   GEN: Chronically ill appearing, comfortable and NAD Neck: No hematoma from R former TVP site Cardiac: Irregularly irregular, III/VI holosystolic murmur without rubs, or gallops  Respiratory: poor respiratory effort but CTAB GI: Soft, nontender, non-distended  MS: able to move all extremities against gravity Neuro:  Nonfocal  Psych: Normal affect  Integument: Left sided PPM site No pocket hematoma  Labs    High Sensitivity Troponin:   Recent Labs  Lab 05/09/20 1307 05/09/20 1506  TROPONINIHS 81* 86*      Chemistry Recent Labs  Lab 05/09/20 1307 05/09/20 2039 05/09/20 2041 05/12/20 0401 05/13/20 0032 05/14/20 0051  NA 139 134*   < > 135 134* 134*  K 4.3 4.6   < > 4.1 4.1 3.9  CL 100 98   < > 100 100 99  CO2 23 24   < > 27 23 25   GLUCOSE 111* 161*   < > 91 87 99  BUN 36* 36*   < > 37* 41* 38*  CREATININE  2.97* 3.01*   < > 1.83* 1.91* 1.51*  CALCIUM 9.1 9.3   < > 8.9 9.0 9.0  PROT 6.7 7.1  --  6.0*  --   --   ALBUMIN 2.6* 2.7*  --  2.9*  --   --   AST 52* 54*  --  45*  --   --   ALT 24 24  --  21  --   --   ALKPHOS 136* 136*  --  90  --   --   BILITOT 7.3* 9.3*  --  6.1*  --   --   GFRNONAA 16* 16*   < > 29* 28* 37*  ANIONGAP 16* 12   < > 8 11 10    < > = values in this interval not displayed.     Hematology Recent Labs  Lab 05/12/20 0401 05/13/20 0032 05/14/20 0343  WBC 4.0 4.8 6.0  RBC 2.88* 3.20* 3.15*  HGB 9.0* 9.8* 9.9*  HCT 27.2* 30.8* 29.2*  MCV 94.4 96.3 92.7  MCH 31.3 30.6 31.4  MCHC 33.1 31.8 33.9  RDW 17.4* 18.3* 18.5*  PLT 43* 44* 54*    BNP Recent Labs  Lab 05/09/20 1307  BNP 488.0*     DDimer No results for input(s): DDIMER in the last 168 hours.   Radiology    No results found.  Cardiac Studies   Echo 01/24/20: 1. Left ventricular ejection fraction, by estimation, is 35  to 40%. The  left ventricle has moderately decreased function. The left ventricle  demonstrates global hypokinesis. Left ventricular diastolic function could  not be evaluated. There is severe  hypokinesis of the left ventricular, entire inferior wall and  inferolateral wall.   2. Right ventricular systolic function is mildly reduced. The right  ventricular size is normal. There is mildly elevated pulmonary artery  systolic pressure.   3. Left atrial size was severely dilated.   4. Right atrial size was severely dilated.   5. The mitral valve is normal in structure. Mild to moderate mitral valve  regurgitation.   6. Tricuspid valve regurgitation is moderate.   7. The aortic valve is tricuspid. Aortic valve regurgitation is not  visualized. Mild aortic valve sclerosis is present, with no evidence of  aortic valve stenosis.   8. The inferior vena cava is normal in size with <50% respiratory  variability, suggesting right atrial pressure of 8 mmHg.   Patient Profile     70 y.o. female with history of multivessel CAD status post CABG in 2001 with subsequent PCIs, nonalcoholic cirrhosis with esophageal varices and thrombocytopenia, heart failure with reduced ejection fraction as low as 20 to 35%, HTN, HLD, Afib not on AC due to cirrhosis and varices, SSS, COPD on home O2 who presented to [facility] with an episode of unresponsiveness found to be profoundly bradycardic in the field initially responsive to atropine. On arrival to [facility], reportedly had episode of accelerated idioventricular rhythm-->10s pause in which CPR was almost initiated and then she developed a bradycardic rhythm consistent with CHB. Subsequently transferred to [facility] now s/p DC PPM.  Assessment & Plan    #HFrEF 35% # Mitral Regurgitation (mild to moderate) # AKI on CKI - Secondary to shock -TTE with stable LVEF 73-41%, diastolic and systolic MR and TR  Creatinine continues to improve - holding MRA and torsemide; with  this patient is still on RA - encourage oral intake but no IVF  Chronic Pain - Palliative Care consulted 05/12/20; appreciate  recs - DNR as of 05/13/20 - acetaminophen order DCed  #Multivessel CAD s/p CABG in 2001 with subsequent PCI: # HLD -NO ASA or Plavix with given thrombocytopenia and cirrhosis - stable platelets -GDMT limited due to cirrhosis and thrombocytopenia  -Continue zetia 60m daily  #Paroxysmal Afib: - Not on AC due to high risk of bleed with cirrhosis and thrombocytopenia. -rates controled on low dose BB  #Non-alcoholic cirrhosis: Complicated by varices and thrombocytopenia. -Trend plt (improving) -No AC or DAPT at this time  PNA - Continuing 5 day Unasyn course - medicine consulted, appreciate recs  #COPD on home O2: -Continue supplemental O2 as needed -Inhalers prn  #Complete Heart Block: #Accelerated Idioventricular Rhythm: -EP consulted, placed DC PPM; has presently signed off  DNR  Thin Diet Labs ordered SCDs for DVT prophylaxis Planned to transfer to floor today (ordered since 05/12/20) SNF Planning started  For questions or updates, please contact [facility] Please consult [email or web address] for contact info under        Signed, MWerner Lean MD  05/14/2020, 7:33 AM

## 2020-05-14 NOTE — Progress Notes (Signed)
Daily Progress Note   Patient Name: Samantha [name]       Date: 05/14/2020 DOB: [birth year]  Age: 70 y.o. MRN#: [id number] Attending Physician: Freada Bergeron, MD Primary Care Physician: [name], MD Admit Date: 05/09/2020  Reason for Consultation/Follow-up: Establishing goals of care  Subjective: Ms. Samantha [name] resting in bed. Reports her pain is much better today. She is smiling and stated she was able to walk today and this made her very happy.  We discussed our conversation that we had yesterday. She feels very much at peace with her decision for DNR. She says- "I'm going to do the best I can, and I might die but that's ok"   ROS  Length of Stay: 5  Current Medications: Scheduled Meds:  . Chlorhexidine Gluconate Cloth  6 each Topical Daily  . DULoxetine  20 mg Oral QHS  . ezetimibe  10 mg Oral QHS  . guaiFENesin  600 mg Oral BID  . lidocaine  1 patch Transdermal Q24H  . methocarbamol  500 mg Oral TID  . metoprolol tartrate  12.5 mg Oral BID  . oxyCODONE  20 mg Oral Q12H  . pantoprazole  40 mg Oral Daily  . senna-docusate  2 tablet Oral QHS  . sodium chloride flush  10-40 mL Intracatheter Q12H  . sodium chloride flush  3 mL Intravenous Q12H  . sodium chloride flush  3 mL Intravenous Q12H    Continuous Infusions: . sodium chloride 10 mL/hr at 05/14/20 2000  . sodium chloride 999 mL/hr at 05/11/20 1646  . ampicillin-sulbactam (UNASYN) IV Stopped (05/14/20 1232)    PRN Meds: sodium chloride, sodium chloride, albuterol, ipratropium, oxyCODONE, pneumococcal 23 valent vaccine, sodium chloride flush, sodium chloride flush, sodium chloride flush  Physical Exam Vitals and nursing note reviewed.  Pulmonary:     Effort: Pulmonary effort is normal.             Vital  Signs: BP (!) 107/46   Pulse 80   Temp 98.2 F (36.8 C) (Oral)   Resp 14   Ht 5' 2"  (1.575 m)   Wt 81.8 kg   LMP 03/29/2011   SpO2 97%   BMI 32.98 kg/m  SpO2: SpO2: 97 % O2 Device: O2 Device: Room Air O2 Flow Rate: O2 Flow Rate (L/min): 2 L/min  Intake/output  summary:   Intake/Output Summary (Last 24 hours) at 05/14/2020 2113 Last data filed at 05/14/2020 2000 Gross per 24 hour  Intake 1214.56 ml  Output 700 ml  Net 514.56 ml   LBM: Last BM Date:  (PTA) Baseline Weight: Weight: 73.9 kg Most recent weight: Weight: 81.8 kg       Palliative Assessment/Data: PPS: 50%      Patient Active Problem List   Diagnosis Date Noted  . Chronic systolic congestive heart failure ([place])   . NSTEMI (non-ST elevated myocardial infarction) ([place])   . Pacemaker   . 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 Symptomatic bradycardia 05/09/2020  . Heart block AV complete ([place]) 05/09/2020  . Acute on chronic combined systolic and diastolic CHF (congestive heart failure) ([place]) 05/09/2020  . Paroxysmal ventricular tachycardia ([place]) - related to Bradycardia  05/09/2020  . L1 and L 2--Lumbar compression fracture, closed, initial encounter ([place]) 04/14/2020  . Chronic respiratory failure with hypoxia -CHF  04/13/2020  . Compression fracture of L2 lumbar vertebra ([place]) 04/12/2020  . Cholestasis, intrahepatic 03/12/2020  . Lobar pneumonia ([place]) 02/18/2020  . Hepatic cirrhosis ([place])   . Hypokalemia   . Anasarca 02/14/2020  . Cirrhosis of liver with ascites ([place]) 02/14/2020  . Volume overload 02/13/2020  . Constipation 02/11/2020  . Peripheral edema 02/03/2020  . Thrombocytopenia ([place]) 01/24/2020  . Left leg swelling 01/24/2020  . Hyperammonemia ([place]) 01/24/2020  . Prolonged QT interval 01/24/2020  . Elevated troponin 01/24/2020  . Intractable nausea and vomiting 01/23/2020  . Atypical chest pain 08/06/2019  . GERD (gastroesophageal reflux disease) 08/06/2019  . Acute respiratory failure  with hypoxia ([place]) 05/23/2019  . Abdominal pain 05/20/2019  . Intractable abdominal pain 05/19/2019  . Paroxysmal atrial fibrillation ([place]) 05/19/2019  . Anxiety with depression 05/19/2019  . Sinus arrhythmia 05/19/2019  . Iron deficiency anemia 02/05/2019  . Chronic combined systolic and diastolic CHF (congestive heart failure) ([place]) 04/17/2018  . Heart failure ([place]) 04/17/2018  . Osteoarthritis of spine with radiculopathy, cervical region 01/12/2018  . Paresthesia of both hands 12/14/2017  . Recurrent falls 12/14/2017  . Closed compression fracture of first lumbar vertebra ([place]) 08/23/2017  . Lumbar spondylosis with myelopathy 08/23/2017  . Carpal tunnel syndrome of right wrist 04/05/2017  . Rectal pain 03/06/2017  . Influenza A (H1N1) 02/22/2017  . Rectal bleeding 02/22/2017  . Closed fracture of lower end of right radius with routine healing 02/20/2017  . Injury of triangular fibrocartilage complex of right wrist 02/20/2017  . Pain 02/10/2017  . Other cirrhosis of liver ([place]) 10/26/2016  . Persistent atrial fibrillation 08/16/2016  . Cardiomyopathy, ischemic-35-40% by cath  01/11/2015  . Obesity 09/24/2013  . Unspecified hereditary and idiopathic peripheral neuropathy 01/10/2013  . Hereditary and idiopathic peripheral neuropathy 01/10/2013  . Cirrhosis, non-alcoholic ([place]) 46/96/2952  . Esophageal varices- Plavix stopped 12/23/2011  . GI bleed 12/23/2011  . Idiopathic esophageal varices without bleeding ([place]) 12/23/2011  . Anemia 04/22/2011  . Coronary artery disease involving native coronary artery of native heart with angina pectoris ([place]) 08/21/2009  . Mixed hyperlipidemia 06/04/2008  . Essential hypertension 06/04/2008  . S/P CABG x 3- 2001 06/04/2008    Palliative Care Assessment & Plan   Patient Profile: 70 y.o. female  with past medical history of multivessel CAD status post CABG 8413, nonalcoholic hepatic cirrhosis with the chart for GU varices and  thrombocytopenia, HFrEF with EF 35%, hypertension, hyperlipidemia, permanent atrial fibrillation not on anticoagulation, COPD on home oxygen admitted on 05/09/2020 with symptomatic bradycardia found  to have complete heart block.  Patient was transferred to the ICU on dopamine drip and had a pacemaker placed.  Patient currently off of dopamine drip and will be transferred to a stepdown unit.  Palliative team is consulted for goal of care conversation.  Assessment/Recommendations/Plan   Continue current plan of care  Please continue oxycontin and cymbalta at discharge  D/C to SNF with palliative, transition to Hospice when ready for discharge home, patient is currently enrolled with [facility] and Additional Recommendations:  Limitations on Scope of Treatment: Full Scope Treatment  Code Status:  DNR  Prognosis:   Unable to determine  Discharge Planning:  [place] for rehab with Palliative care service follow-up  Care plan was discussed with patient.   Thank you for allowing the Palliative Medicine Team to assist in the care of this patient.   Total time: 24 mins Greater than 50%  of this time was spent counseling and coordinating care related to the above assessment and plan.  Mariana Kaufman, AGNP-C Palliative Medicine   Please contact Palliative Medicine Team phone at [phone] for questions and concerns.

## 2020-05-14 NOTE — Progress Notes (Signed)
[facility] Consult Progress Note                                                                               Patient Demographics  Samantha [name], is a 70 y.o. female, DOB - 06/20/1950, RSW:[id number]  Admit date - 05/09/2020   Admitting Physician Freada Bergeron, MD  Outpatient Primary MD for the patient is Sasser, Silvestre Moment, MD  Outpatient specialists:   LOS - 5  days   Medical records reviewed and are as summarized below:    Chief Complaint  Patient presents with  . Bradycardia       Brief summary   70 year old female with history of CAD status post CABG x3, chronic systolic CHF with LVEF of 35 to 40% in January 2021, NASH with cirrhosis and portal hypertension, PAF, hypertension, L1-L2 compression fracture in February 2022, chronic hypoxic respiratory failure on home oxygen as needed presented with multiple falls, unresponsiveness and symptomatic bradycardia with heart rates in the 30s-40s.  She was transferred from [facility] to [facility] under cardiology care.  She was started on dopamine drip and underwent transvenous pacemaker.  TRH service was consulted on 05/10/2020 for determination of CHF versus pneumonia.   Assessment & Plan    Principal Problem:   Heart block AV complete (HCC) -Underwent permanent pacemaker placement on 3/14 -Management per EP and cardiology, primary service  Cardiogenic shock, acute on chronic systolic CHF, mild to moderate MR, multivessel CAD/CABG in 2001 Hyperlipidemia, paroxysmal A. fib not on anticoagulation -Management per primary cardiology team.  Currently off dopamine drip and not on diuretics -Palliative medicine has been consulted by cardiology for goals of care  ?  Pneumonia, patchy airspace opacities -CT of the chest showed patchy areas of airspace opacities suggestive of multifocal pneumonia, viral or atypical cannot rule out aspiration -procalcitonin <0.1 suggests that likely not bacterial  pneumonia -Continue IV Unasyn for total of 5 days, day #4 today, DC antibiotics in a.m. -Improving, O2 sats 99% on room air   AKI on CKD stage IIIa -Possibly from cardiorenal syndrome, baseline creatinine 1.1 -On admission creatinine 2.97, improving, creatinine 1.5 today  NASH with cirrhosis of liver, portal hypertension with elevated LFTs -Patient follows up with GI as an outpatient, LFTs improving  Chronic thrombocytopenia -Monitor platelet counts, improving  COPD with chronic hypoxic respiratory failure -No wheezing, O2 sats 99% on room air  Obesity Estimated body mass index is 32.98 kg/m as calculated from the following:   Height as of this encounter: 5' 2"  (1.575 m).   Weight as of this encounter: 81.8 kg.   Generalized debility PT recommending SNF  Code Status: Full code DVT Prophylaxis:  Place and maintain sequential compression device Start: 05/11/20 0834 SCD's Start: 05/09/20 1832   Level of Care: Level of care: Telemetry Cardiac Family Communication: Discussed all imaging results, lab results, explained to the patient    Disposition Plan:     per cardiology.  Please DC antibiotics after day #5, tomorrow after completing the course. I will sign off.  Please call with any questions  Time Spent in minutes 25 minutes  Antimicrobials:  Anti-infectives (From admission, onward)   Start     Dose/Rate Route Frequency Ordered Stop   05/11/20 1200  Ampicillin-Sulbactam (UNASYN) 3 g in sodium chloride 0.9 % 100 mL IVPB        3 g 200 mL/hr over 30 Minutes Intravenous Every 12 hours 05/11/20 1107 05/16/20 1159   05/11/20 0800  gentamicin (GARAMYCIN) 80 mg in sodium chloride 0.9 % 500 mL irrigation        80 mg Irrigation On call 05/11/20 0446 05/11/20 1730   05/11/20 0800  vancomycin (VANCOCIN) IVPB 1000 mg/200 mL premix        1,000 mg 200 mL/hr over 60 Minutes Intravenous On call 05/11/20 0446 05/11/20 1752         Medications  Scheduled Meds: .  Chlorhexidine Gluconate Cloth  6 each Topical Daily  . DULoxetine  20 mg Oral QHS  . ezetimibe  10 mg Oral QHS  . guaiFENesin  600 mg Oral BID  . lidocaine  1 patch Transdermal Q24H  . methocarbamol  500 mg Oral TID  . metoprolol tartrate  12.5 mg Oral BID  . oxyCODONE  20 mg Oral Q12H  . pantoprazole  40 mg Oral Daily  . senna-docusate  2 tablet Oral QHS  . sodium chloride flush  10-40 mL Intracatheter Q12H  . sodium chloride flush  3 mL Intravenous Q12H  . sodium chloride flush  3 mL Intravenous Q12H   Continuous Infusions: . sodium chloride    . sodium chloride 999 mL/hr at 05/11/20 1646  . ampicillin-sulbactam (UNASYN) IV 3 g (05/13/20 2327)   PRN Meds:.sodium chloride, sodium chloride, albuterol, ipratropium, oxyCODONE, pneumococcal 23 valent vaccine, sodium chloride flush, sodium chloride flush, sodium chloride flush      Subjective:   Samantha [name] was seen and examined today.  No acute complaints, no chest pain.  No nausea vomiting.  Eating breakfast at the time of my encounter.  Overnight no acute events, afebrile.   Objective:   Vitals:   05/14/20 0500 05/14/20 0600 05/14/20 0700 05/14/20 0832  BP: (!) 118/56 (!) 120/53    Pulse: 92 84 90   Resp: 17 16 10    Temp:    97.8 F (36.6 C)  TempSrc:    Oral  SpO2: 98% 96% 100%   Weight: 81.8 kg     Height:        Intake/Output Summary (Last 24 hours) at 05/14/2020 0952 Last data filed at 05/14/2020 0000 Gross per 24 hour  Intake 400 ml  Output 500 ml  Net -100 ml     Wt Readings from Last 3 Encounters:  05/14/20 81.8 kg  04/12/20 74.8 kg  04/08/20 77.7 kg   Physical Exam  General: Alert and oriented x 3, NAD  Cardiovascular: S1 S2 clear, RRR.  Pacer, 2/6 holosystolic murmur  Respiratory: No wheezing, decreased percent of the bases  Gastrointestinal: Soft, nontender, nondistended, NBS  Ext: trace pedal edema bilaterally  Neuro: no new deficits  Musculoskeletal: No cyanosis, clubbing  Skin: No  rashes  Psych: Normal affect and demeanor, alert and oriented x3    Data Reviewed:  I have personally reviewed following labs and imaging studies  Micro Results Recent Results (from the past 240 hour(s))  Resp Panel by RT-PCR (Flu A&B, Covid) Nasopharyngeal Swab     Status: None   Collection Time: 05/09/20  1:05 PM   Specimen: Nasopharyngeal Swab; Nasopharyngeal(NP) swabs in vial transport medium  Result Value Ref Range Status   SARS  Coronavirus 2 by RT PCR NEGATIVE NEGATIVE Final    Comment: (NOTE) SARS-CoV-2 target nucleic acids are NOT DETECTED.  The SARS-CoV-2 RNA is generally detectable in upper respiratory specimens during the acute phase of infection. The lowest concentration of SARS-CoV-2 viral copies this assay can detect is 138 copies/mL. A negative result does not preclude SARS-Cov-2 infection and should not be used as the sole basis for treatment or other patient management decisions. A negative result may occur with  improper specimen collection/handling, submission of specimen other than nasopharyngeal swab, presence of viral mutation(s) within the areas targeted by this assay, and inadequate number of viral copies(<138 copies/mL). A negative result must be combined with clinical observations, patient history, and epidemiological information. The expected result is Negative.  Fact Sheet for Patients:  EntrepreneurPulse.com.au  Fact Sheet for Healthcare Providers:  IncredibleEmployment.be  This test is no t yet approved or cleared by the [place] FDA and  has been authorized for detection and/or diagnosis of SARS-CoV-2 by FDA under an Emergency Use Authorization (EUA). This EUA will remain  in effect (meaning this test can be used) for the duration of the COVID-19 declaration under Section 564(b)(1) of the Act, 21 U.S.C.section 360bbb-3(b)(1), unless the authorization is terminated  or revoked sooner.       Influenza A  by PCR NEGATIVE NEGATIVE Final   Influenza B by PCR NEGATIVE NEGATIVE Final    Comment: (NOTE) The Xpert Xpress SARS-CoV-2/FLU/RSV plus assay is intended as an aid in the diagnosis of influenza from Nasopharyngeal swab specimens and should not be used as a sole basis for treatment. Nasal washings and aspirates are unacceptable for Xpert Xpress SARS-CoV-2/FLU/RSV testing.  Fact Sheet for Patients: EntrepreneurPulse.com.au  Fact Sheet for Healthcare Providers: IncredibleEmployment.be  This test is not yet approved or cleared by the [place] FDA and has been authorized for detection and/or diagnosis of SARS-CoV-2 by FDA under an Emergency Use Authorization (EUA). This EUA will remain in effect (meaning this test can be used) for the duration of the COVID-19 declaration under Section 564(b)(1) of the Act, 21 U.S.C. section 360bbb-3(b)(1), unless the authorization is terminated or revoked.  Performed at [facility], [address] [place], [place] [address]   MRSA PCR Screening     Status: None   Collection Time: 05/09/20  2:31 PM   Specimen: Nasopharyngeal  Result Value Ref Range Status   MRSA by PCR NEGATIVE NEGATIVE Final    Comment:        The GeneXpert MRSA Assay (FDA approved for NASAL specimens only), is one component of a comprehensive MRSA colonization surveillance program. It is not intended to diagnose MRSA infection nor to guide or monitor treatment for MRSA infections. Performed at [facility], [place] [address], [place], [place] [address]   Surgical PCR screen     Status: None   Collection Time: 05/10/20 10:14 AM   Specimen: Nasal Mucosa; Nasal Swab  Result Value Ref Range Status   MRSA, PCR NEGATIVE NEGATIVE Final   Staphylococcus aureus NEGATIVE NEGATIVE Final    Comment: (NOTE) The Xpert SA Assay (FDA approved for NASAL specimens in patients 59 years of age and older), is one component of a  comprehensive surveillance program. It is not intended to diagnose infection nor to guide or monitor treatment. Performed at [facility], [place] [address], [place], [place] [address]     Radiology Reports DG Chest 2 View  Result Date: 05/12/2020 CLINICAL DATA:  Post pacer placement EXAM:  CHEST - 2 VIEW COMPARISON:  05/11/2020 FINDINGS: Interval placement of left chest wall dual lead pacemaker. Leads overlie the right atrium and right ventricle. No pneumothorax. Removal of temporary pacemaker with right IJ introducer remaining present. Similar mild pulmonary edema and cardiomegaly. IMPRESSION: Post pacemaker placement without complication. Similar mild pulmonary edema and cardiomegaly. Electronically Signed   By: Macy Mis M.D.   On: 05/12/2020 08:15   CT CHEST WO CONTRAST  Result Date: 05/10/2020 CLINICAL DATA:  History of pneumonia by report. Multifocal opacities on prior imaging. EXAM: CT CHEST WITHOUT CONTRAST TECHNIQUE: Multidetector CT imaging of the chest was performed following the standard protocol without IV contrast. COMPARISON:  May 17, 2019. FINDINGS: Cardiovascular: RIGHT-sided transvenous pacer terminates in the pulmonary artery outflow tract in the proximal main pulmonary artery. Calcified atheromatous plaque of the thoracic aorta with normal caliber. Three-vessel coronary artery disease. Heart size is enlarged without pericardial effusion. Central pulmonary vasculature engorged unchanged from prior imaging. Limited assessment of cardiovascular structures given lack of intravenous contrast. Mediastinum/Nodes: No adenopathy in the mediastinum. Patulous esophagus with debris in the lumen. Scattered lymph nodes throughout the chest mildly prominent without pathologic enlargement. Lungs/Pleura: Patchy areas of airspace opacity throughout the chest. Nodule in the RIGHT lung base (image 114, series 8. Mainly ground-glass. 13 mm nodular ground-glass focus in the RIGHT lung base on  image 103. Scattered ground-glass nodules essentially throughout the chest slightly more numerous in the mid chest though distributed in the upper lobes as well. Septal thickening. Basilar atelectasis. Upper Abdomen: Lobular hepatic contours. Cholelithiasis without pericholecystic stranding. Signs of portosystemic collateral suggested in the upper abdomen not well evaluated. Musculoskeletal: Cement augmentation of the L1 vertebral level seen on prior imaging. Osteopenia.  Signs of sternotomy. Worsening loss of height about the L2. Approximately 40-50% loss of height at L2, progressed from previous imaging where there was less than 40% loss of height. Superior endplate of L3 also with signs of fracture, likely new. L3 incompletely imaged. IMPRESSION: 1. Patchy areas of airspace opacity throughout the chest with basilar atelectasis. Findings could be seen in the setting of multifocal pneumonia, likely viral or atypical process. Given the patulous debris-filled esophagus would also entertained the possibility of a component of aspiration pneumonitis though findings are distributed throughout the lungs rather than at the basilar portions of the chest. 2. Transvenous pacer device is in the pulmonary artery outflow tract. Correlate with pacer function and consider repositioning as warranted. 3. Worsening of lumbar spine compression fractures at L2 and new compression fracture at L3. Loss of height is greatest at L2 as described with prior cement augmentation of L1. L3 is incompletely imaged. 4. Three-vessel coronary artery disease. 5. Patulous esophagus with debris in the lumen this may reflect reflux or esophageal dysmotility. 6. Signs of liver disease. 7. Cholelithiasis. 8. Aortic atherosclerosis. These results will be called to the ordering clinician or representative by the Radiologist Assistant, and communication documented in the PACS or [employer]. Aortic Atherosclerosis (ICD10-I70.0). Electronically Signed    By: Zetta Bills M.D.   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  Upon insertion of the sheath, the patient went into AI VR with rates of the 110to 120s -> at that point I decided to forego right heart catheterization and just place the temporary pacemaker. => Temporary pacer  used to overdrive pace out of AI VR. 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 RECOMMENDATIONS  Return to nursing unit, continue on dopamine wean as tolerated for blood pressures.  Patient is now essentially pacemaker dependent-would need backup transcutaneous pacing in place if TPM becomes unstable We will need formal EP evaluation to determine timing of PPM placement. Glenetta Hew, MD   DG Chest Port 1 View  Result Date: 05/11/2020 CLINICAL DATA:  Congestive heart failure and heart block. EXAM: PORTABLE CHEST 1 VIEW COMPARISON:  05/10/2020 FINDINGS: Stable cardiac enlargement. Right jugular transvenous pacemaker demonstrates stable position with the tip near the expected region of the pulmonary outflow tract. Pulmonary edema pattern improved since the prior chest x-ray. No pleural effusions or pneumothorax. IMPRESSION: Improving pulmonary edema. Stable cardiomegaly. Electronically Signed   By: Aletta Edouard M.D.   On: 05/11/2020 08:10   DG CHEST PORT 1 VIEW  Result Date: 05/10/2020 CLINICAL DATA:  Shortness of breath and chest pain EXAM: PORTABLE CHEST 1 VIEW COMPARISON:  February 17, 2020 FINDINGS: There is cardiomegaly with pulmonary vascularity within normal limits. Patient is status post coronary artery bypass grafting. Apparent pacemaker lead in right atrial region. There is ill-defined airspace opacity in each upper lobe and left base region. No consolidation. No appreciable adenopathy. There is aortic  atherosclerosis. No bone lesions. IMPRESSION: Ill-defined airspace opacity in each upper lobe and left base regions. Appearance felt to be indicative of multifocal pneumonia. Question atypical organism pneumonia. Check of COVID-19 status advised. Cardiomegaly. Status post coronary artery bypass grafting. Apparent pacemaker via trans jugular approach with lead attached to right heart, likely right atrium. Aortic Atherosclerosis (ICD10-I70.0). Electronically Signed   By: Lowella Grip III M.D.   On: 05/10/2020 10:53   ECHOCARDIOGRAM COMPLETE  Result Date: 05/09/2020    ECHOCARDIOGRAM REPORT   Patient Name:   NAYELIS BONITO Date of Exam: 05/09/2020 Medical Rec #:  [id number]      Height:       62.0 in Accession #:    [id number]     Weight:       163.0 lb Date of Birth:  November 20, 1950      BSA:          1.753 m Patient Age:    20 years       BP:           105/42 mmHg Patient Gender: F              HR:           46 bpm. Exam Location:  Inpatient Procedure: 2D Echo, Cardiac Doppler, Color Doppler and Intracardiac            Opacification Agent STAT ECHO Indications:    Complete heart block  History:        Patient has prior history of Echocardiogram examinations, most                 recent 01/24/2020. CHF, CAD, Prior CABG, PAD and COPD,                 Arrythmias:Atrial Flutter; Risk Factors:Diabetes, Hypertension                 and Dyslipidemia.  Sonographer:    Dustin Flock Referring Phys: [id number] [place]  1. Left ventricular ejection fraction, by estimation, is 35 to 40%. The left ventricle has moderately decreased function. The left ventricle demonstrates global hypokinesis and RWMA as noted in findings. There  is mild left ventricular hypertrophy. Left ventricular diastolic parameters are indeterminate.  2. Respiratory related septal shift.  3. Right ventricular systolic function is mildly reduced. The right ventricular size is normal. There is moderately elevated pulmonary artery  systolic pressure. The estimated right ventricular systolic pressure is 24.0 mmHg.  4. Left atrial size was moderately dilated.  5. Right atrial size was moderately dilated.  6. Diastolic and systolic mitral valve regurgitation.. The mitral valve is grossly normal. Mild to moderate mitral valve regurgitation.  7. Tricuspid valve regurgitation is moderate.  8. The aortic valve is grossly normal. There is mild calcification of the aortic valve. Aortic valve regurgitation is not visualized. No aortic stenosis is present.  9. The inferior vena cava is dilated in size with <50% respiratory variability, suggesting right atrial pressure of 15 mmHg. FINDINGS  Left Ventricle: Left ventricular ejection fraction, by estimation, is 35 to 40%. The left ventricle has moderately decreased function. The left ventricle demonstrates global hypokinesis. Definity contrast agent was given IV to delineate the left ventricular endocardial borders. The left ventricular internal cavity size was normal in size. There is mild left ventricular hypertrophy. Abnormal (paradoxical) septal motion, consistent with left bundle branch block. Left ventricular diastolic parameters are indeterminate.  LV Wall Scoring: The inferior wall, posterior wall, and mid inferoseptal segment are akinetic. The mid anteroseptal segment is hypokinetic. Right Ventricle: The right ventricular size is normal. No increase in right ventricular wall thickness. Right ventricular systolic function is mildly reduced. There is moderately elevated pulmonary artery systolic pressure. The tricuspid regurgitant velocity is 3.08 m/s, and with an assumed right atrial pressure of 15 mmHg, the estimated right ventricular systolic pressure is 97.3 mmHg. Left Atrium: Left atrial size was moderately dilated. Right Atrium: Right atrial size was moderately dilated. Pericardium: There is no evidence of pericardial effusion. Mitral Valve: Diastolic and systolic mitral valve regurgitation.  The mitral valve is grossly normal. Mild to moderate mitral valve regurgitation. MV peak gradient, 16.6 mmHg. The mean mitral valve gradient is 3.0 mmHg with average heart rate of 47 bpm. Tricuspid Valve: The tricuspid valve is normal in structure. Tricuspid valve regurgitation is moderate . No evidence of tricuspid stenosis. Aortic Valve: The aortic valve is grossly normal. There is mild calcification of the aortic valve. Aortic valve regurgitation is not visualized. No aortic stenosis is present. Aortic valve mean gradient measures 8.0 mmHg. Aortic valve peak gradient measures 19.0 mmHg. Aortic valve area, by VTI measures 3.12 cm. Pulmonic Valve: The pulmonic valve was normal in structure. Pulmonic valve regurgitation is trivial. Aorta: The aortic root is normal in size and structure. Venous: The inferior vena cava is dilated in size with less than 50% respiratory variability, suggesting right atrial pressure of 15 mmHg. IAS/Shunts: No atrial level shunt detected by color flow Doppler.  LEFT VENTRICLE PLAX 2D LVIDd:         5.20 cm  Diastology LVIDs:         4.70 cm  LV e' medial:    6.09 cm/s LV PW:         1.20 cm  LV E/e' medial:  26.3 LV IVS:        1.20 cm  LV e' lateral:   9.68 cm/s LVOT diam:     2.30 cm  LV E/e' lateral: 16.5 LV SV:         133 LV SV Index:   76 LVOT Area:     4.15 cm  RIGHT VENTRICLE RV Basal diam:  3.30 cm RV S prime:     9.79 cm/s TAPSE (M-mode): 2.1 cm LEFT ATRIUM             Index       RIGHT ATRIUM           Index LA diam:        4.90 cm 2.80 cm/m  RA Area:     22.90 cm LA Vol (A2C):   70.9 ml 40.46 ml/m RA Volume:   75.40 ml  43.02 ml/m LA Vol (A4C):   69.3 ml 39.54 ml/m LA Biplane Vol: 73.7 ml 42.05 ml/m  AORTIC VALVE                    PULMONIC VALVE AV Area (Vmax):    2.65 cm     PV Vmax:       1.86 m/s AV Area (Vmean):   2.78 cm     PV Peak grad:  13.8 mmHg AV Area (VTI):     3.12 cm AV Vmax:           218.00 cm/s AV Vmean:          131.000 cm/s AV VTI:             0.425 m AV Peak Grad:      19.0 mmHg AV Mean Grad:      8.0 mmHg LVOT Vmax:         139.00 cm/s LVOT Vmean:        87.700 cm/s LVOT VTI:          0.319 m LVOT/AV VTI ratio: 0.75  AORTA Ao Root diam: 2.90 cm MITRAL VALVE                TRICUSPID VALVE MV Area (PHT): 7.16 cm     TR Peak grad:   37.9 mmHg MV Area VTI:   2.12 cm     TR Vmax:        308.00 cm/s MV Peak grad:  16.6 mmHg MV Mean grad:  3.0 mmHg     SHUNTS MV Vmax:       2.04 m/s     Systemic VTI:  0.32 m MV Vmean:      91.0 cm/s    Systemic Diam: 2.30 cm MV Decel Time: 106 msec MV E velocity: 160.00 cm/s MV A velocity: 131.00 cm/s MV E/A ratio:  1.22 Cherlynn Kaiser MD Electronically signed by Cherlynn Kaiser MD Signature Date/Time: 05/09/2020/5:23:45 PM    Final     Lab Data:  CBC: Recent Labs  Lab 05/09/20 1307 05/09/20 2039 05/09/20 2041 05/10/20 0102 05/11/20 0219 05/12/20 0401 05/13/20 0032 05/14/20 0343  WBC 5.5 9.4  --   --  5.2 4.0 4.8 6.0  NEUTROABS 3.9  --   --   --   --   --   --   --   HGB 11.7* 12.0   < > 12.6 10.5* 9.0* 9.8* 9.9*  HCT 37.0 37.2   < > 37.0 32.7* 27.2* 30.8* 29.2*  MCV 96.6 94.4  --   --  94.5 94.4 96.3 92.7  PLT 67* 89*  --   --  50* 43* 44* 54*   < > = values in this interval not displayed.   Basic Metabolic Panel: Recent Labs  Lab 05/09/20 1307 05/09/20 2039 05/10/20 0607 05/11/20 0219 05/12/20 0401 05/13/20 0032 05/14/20 0051  NA 139   < > 140 136 135 134* 134*  K 4.3   < > 4.9 4.4 4.1 4.1 3.9  CL 100   < > 103 99 100 100 99  CO2 23   < > 26 27 27 23 25   GLUCOSE 111*   < > 111* 106* 91 87 99  BUN 36*   < > 37* 37* 37* 41* 38*  CREATININE 2.97*   < > 2.79* 2.30* 1.83* 1.91* 1.51*  CALCIUM 9.1   < > 9.3 9.3 8.9 9.0 9.0  MG 1.9  --   --   --   --   --   --    < > = values in this interval not displayed.   GFR: Estimated Creatinine Clearance: 33.9 mL/min (A) (by C-G formula based on SCr of 1.51 mg/dL (H)). Liver Function Tests: Recent Labs  Lab 05/09/20 1307 05/09/20 2039  05/12/20 0401  AST 52* 54* 45*  ALT 24 24 21   ALKPHOS 136* 136* 90  BILITOT 7.3* 9.3* 6.1*  PROT 6.7 7.1 6.0*  ALBUMIN 2.6* 2.7* 2.9*   No results for input(s): LIPASE, AMYLASE in the last 168 hours. No results for input(s): AMMONIA in the last 168 hours. Coagulation Profile: Recent Labs  Lab 05/10/20 1642 05/11/20 0219 05/12/20 0401 05/13/20 0032 05/14/20 0051  INR 2.1* 2.2* 2.2* 2.2* 1.9*   Cardiac Enzymes: No results for input(s): CKTOTAL, CKMB, CKMBINDEX, TROPONINI in the last 168 hours. BNP (last 3 results) No results for input(s): PROBNP in the last 8760 hours. HbA1C: No results for input(s): HGBA1C in the last 72 hours. CBG: No results for input(s): GLUCAP in the last 168 hours. Lipid Profile: No results for input(s): CHOL, HDL, LDLCALC, TRIG, CHOLHDL, LDLDIRECT in the last 72 hours. Thyroid Function Tests: No results for input(s): TSH, T4TOTAL, FREET4, T3FREE, THYROIDAB in the last 72 hours. Anemia Panel: No results for input(s): VITAMINB12, FOLATE, FERRITIN, TIBC, IRON, RETICCTPCT in the last 72 hours. Urine analysis:    Component Value Date/Time   COLORURINE AMBER (A) 04/12/2020 1822   APPEARANCEUR CLEAR 04/12/2020 1822   LABSPEC 1.015 04/12/2020 1822   PHURINE 6.0 04/12/2020 1822   GLUCOSEU NEGATIVE 04/12/2020 1822   HGBUR NEGATIVE 04/12/2020 1822   BILIRUBINUR NEGATIVE 04/12/2020 1822   KETONESUR NEGATIVE 04/12/2020 1822   PROTEINUR NEGATIVE 04/12/2020 1822   NITRITE NEGATIVE 04/12/2020 1822   LEUKOCYTESUR NEGATIVE 04/12/2020 1822     Ripudeep Rai M.D. [facility] 05/14/2020, 9:52 AM  Available via Epic secure chat 7am-7pm After 7 pm, please refer to night coverage provider listed on amion.

## 2020-05-14 NOTE — Progress Notes (Signed)
Physical Therapy Treatment Patient Details Name: Samantha [name] MRN: [id number] DOB: 1950/07/01 Today's Date: 05/14/2020    History of Present Illness 70 year old admitted 3/12 with falls, unresponsiveness and bradycardia with HR 30-40 pt with comlete heart block. Transvenous pacemaker placed 3/12 PPM placed 3/14. PMHx:  CAD s/p CABG x3, chronic systolic CHF with LVEF of 35 to 40%, NASH with cirrhosis and portal HTN, PAF, HTN, L1-L2 compression fracture in February 2022, chronic hypoxic respiratory failure on home O2    PT Comments    Pt pleasant and wanting to progress mobility. Pt continues to need cues to maintain PPM precautions but able to verbalize restrictions. Pt with improved ability with standing and able to walk short distance today. Pt with noted LLE foot drop which pt thinks is baseline. Encouraged mobility and transfers with nursing with education for precautions. Will continue to follow with D/C plan appropriate.  HR 84-86 SpO2 96-100%   Follow Up Recommendations  Supervision/Assistance - 24 hour;SNF     Equipment Recommendations  None recommended by PT    Recommendations for Other Services       Precautions / Restrictions Precautions Precautions: Fall;ICD/Pacemaker    Mobility  Bed Mobility Overal bed mobility: Needs Assistance Bed Mobility: Supine to Sit     Supine to sit: Mod assist     General bed mobility comments: HOB 35 degrees with assist of pad to pivot ot right with assist to shift legs and hips toward EOb as well as cues for precautions    Transfers Overall transfer level: Needs assistance   Transfers: Sit to/from Stand Sit to Stand: Mod assist         General transfer comment: mod assist with left knee blocked to stand from bed and BSC with cues for LUE across chest and pushing with RUE, pt requires hand over hand assist for hand on surface.  Ambulation/Gait Ambulation/Gait assistance: Min assist;+2 safety/equipment Gait Distance  (Feet): 50 Feet Assistive device: Rolling walker (2 wheeled) Gait Pattern/deviations: Step-through pattern;Decreased stride length   Gait velocity interpretation: <1.8 ft/sec, indicate of risk for recurrent falls General Gait Details: pt with left foot drop which pt states she thinks is normal but does not have AFO. pt with assist to direct RW particularly with turns, chair follow with slow cautious gait and cues for proximity to [employer] Mobility    Modified Rankin (Stroke Patients Only)       Balance Overall balance assessment: Needs assistance;History of Falls Sitting-balance support: No upper extremity supported;Feet supported Sitting balance-Leahy Scale: Fair Sitting balance - Comments: EOB with guarding   Standing balance support: Bilateral upper extremity supported Standing balance-Leahy Scale: Poor Standing balance comment: bil UE support on RW for standing                            Cognition Arousal/Alertness: Awake/alert Behavior During Therapy: WFL for tasks assessed/performed Overall Cognitive Status: Impaired/Different from baseline Area of Impairment: Following commands;Safety/judgement;Problem solving;Orientation                 Orientation Level: Time     Following Commands: Follows one step commands consistently Safety/Judgement: Decreased awareness of safety;Decreased awareness of deficits   Problem Solving: Slow processing General Comments: pt asking if we attended church today despite education on arrival for [employer] day and thursday      Exercises  General Comments        Pertinent Vitals/Pain Pain Score: 6  Pain Location: bil LE pain Pain Descriptors / Indicators: Aching;Constant;Guarding;Cramping Pain Intervention(s): Limited activity within patient's tolerance;Monitored during session;Repositioned    Home Living                      Prior Function            PT  Goals (current goals can now be found in the care plan section) Progress towards PT goals: Progressing toward goals    Frequency    Min 3X/week      PT Plan Current plan remains appropriate    Co-evaluation              AM-PAC PT "6 Clicks" Mobility   Outcome Measure  Help needed turning from your back to your side while in a flat bed without using bedrails?: A Lot Help needed moving from lying on your back to sitting on the side of a flat bed without using bedrails?: A Lot Help needed moving to and from a bed to a chair (including a wheelchair)?: A Lot Help needed standing up from a chair using your arms (e.g., wheelchair or bedside chair)?: A Lot Help needed to walk in hospital room?: A Little Help needed climbing 3-5 steps with a railing? : Total 6 Click Score: 12    End of Session Equipment Utilized During Treatment: Gait belt Activity Tolerance: Patient tolerated treatment well Patient left: in chair;with call bell/phone within reach;with chair alarm set Nurse Communication: Mobility status PT Visit Diagnosis: Other abnormalities of gait and mobility (R26.89);Difficulty in walking, not elsewhere classified (R26.2);Muscle weakness (generalized) (M62.81)     Time: 1660-6301 PT Time Calculation (min) (ACUTE ONLY): 34 min  Charges:  $Gait Training: 8-22 mins $Therapeutic Activity: 8-22 mins                     Taci Sterling P, PT Acute Rehabilitation Services Pager: [phone] Office: [place] 05/14/2020, 9:30 AM

## 2020-05-15 DIAGNOSIS — I25119 Atherosclerotic heart disease of native coronary artery with unspecified angina pectoris: Secondary | ICD-10-CM | POA: Diagnosis not present

## 2020-05-15 DIAGNOSIS — I5022 Chronic systolic (congestive) heart failure: Secondary | ICD-10-CM | POA: Diagnosis not present

## 2020-05-15 DIAGNOSIS — I10 Essential (primary) hypertension: Secondary | ICD-10-CM | POA: Diagnosis not present

## 2020-05-15 DIAGNOSIS — R570 Cardiogenic shock: Secondary | ICD-10-CM | POA: Diagnosis not present

## 2020-05-15 LAB — PROTIME-INR
INR: 2 — ABNORMAL HIGH (ref 0.8–1.2)
Prothrombin Time: 21.9 seconds — ABNORMAL HIGH (ref 11.4–15.2)

## 2020-05-15 LAB — BASIC METABOLIC PANEL
Anion gap: 10 (ref 5–15)
BUN: 27 mg/dL — ABNORMAL HIGH (ref 8–23)
CO2: 24 mmol/L (ref 22–32)
Calcium: 8.8 mg/dL — ABNORMAL LOW (ref 8.9–10.3)
Chloride: 101 mmol/L (ref 98–111)
Creatinine, Ser: 1.08 mg/dL — ABNORMAL HIGH (ref 0.44–1.00)
GFR, Estimated: 55 mL/min — ABNORMAL LOW (ref >60)
Glucose, Bld: 181 mg/dL — ABNORMAL HIGH (ref 70–99)
Potassium: 3.4 mmol/L — ABNORMAL LOW (ref 3.5–5.1)
Sodium: 135 mmol/L (ref 135–145)

## 2020-05-15 LAB — CBC
HCT: 29.7 % — ABNORMAL LOW (ref 36.0–46.0)
Hemoglobin: 10.1 g/dL — ABNORMAL LOW (ref 12.0–15.0)
MCH: 31.5 pg (ref 26.0–34.0)
MCHC: 34 g/dL (ref 30.0–36.0)
MCV: 92.5 fL (ref 80.0–100.0)
Platelets: 44 10*3/uL — ABNORMAL LOW (ref 150–400)
RBC: 3.21 MIL/uL — ABNORMAL LOW (ref 3.87–5.11)
RDW: 18.6 % — ABNORMAL HIGH (ref 11.5–15.5)
WBC: 4.4 10*3/uL (ref 4.0–10.5)
nRBC: 0 % (ref 0.0–0.2)

## 2020-05-15 LAB — SARS CORONAVIRUS 2 (TAT 6-24 HRS): SARS Coronavirus 2: NEGATIVE

## 2020-05-15 MED ORDER — ONDANSETRON HCL 4 MG/2ML IJ SOLN
INTRAMUSCULAR | Status: AC
  Filled 2020-05-15: qty 2

## 2020-05-15 MED ORDER — NITROGLYCERIN 0.4 MG SL SUBL: 0.4000 mg | SUBLINGUAL_TABLET | SUBLINGUAL | Status: DC | PRN

## 2020-05-15 MED ORDER — ONDANSETRON HCL 4 MG PO TABS
4.0000 mg | ORAL_TABLET | Freq: Three times a day (TID) | ORAL | Status: DC | PRN
  Administered 2020-05-15 (×2): 4 mg via ORAL
  Filled 2020-05-15 (×2): qty 1

## 2020-05-15 MED ORDER — POTASSIUM CHLORIDE CRYS ER 20 MEQ PO TBCR
60.0000 meq | EXTENDED_RELEASE_TABLET | Freq: Once | ORAL | Status: AC
  Administered 2020-05-15: 60 meq via ORAL
  Filled 2020-05-15: qty 3

## 2020-05-15 MED ORDER — METOPROLOL SUCCINATE ER 25 MG PO TB24
25.0000 mg | ORAL_TABLET | Freq: Every day | ORAL | Status: DC
  Administered 2020-05-15 – 2020-05-18 (×4): 25 mg via ORAL
  Filled 2020-05-15 (×5): qty 1

## 2020-05-15 MED ORDER — SODIUM CHLORIDE 0.9 % IV SOLN
3.0000 g | Freq: Three times a day (TID) | INTRAVENOUS | Status: DC
  Filled 2020-05-15 (×2): qty 8

## 2020-05-15 NOTE — Progress Notes (Addendum)
Progress Note  Patient Name: Samantha [name] Date of Encounter: 05/15/2020  [facility] Cardiologist: Sherren Mocha, MD   Subjective   Nursing notes chest pain and pressure over night relieved with morphine.  Patient notes that she is tired (s/p morphine) but feels well.  Is happy that her kidney function has improved and is now off ABx.  Inpatient Medications    Scheduled Meds: . Chlorhexidine Gluconate Cloth  6 each Topical Daily  . DULoxetine  20 mg Oral QHS  . ezetimibe  10 mg Oral QHS  . guaiFENesin  600 mg Oral BID  . lidocaine  1 patch Transdermal Q24H  . methocarbamol  500 mg Oral TID  . metoprolol tartrate  12.5 mg Oral BID  . oxyCODONE  20 mg Oral Q12H  . pantoprazole  40 mg Oral Daily  . senna-docusate  2 tablet Oral QHS  . sodium chloride flush  10-40 mL Intracatheter Q12H  . sodium chloride flush  3 mL Intravenous Q12H  . sodium chloride flush  3 mL Intravenous Q12H   Continuous Infusions: . sodium chloride 10 mL/hr at 05/15/20 0200  . sodium chloride 999 mL/hr at 05/11/20 1646   PRN Meds: sodium chloride, sodium chloride, albuterol, ipratropium, oxyCODONE, pneumococcal 23 valent vaccine, sodium chloride flush, sodium chloride flush, sodium chloride flush   Vital Signs    Vitals:   05/15/20 0300 05/15/20 0400 05/15/20 0500 05/15/20 0600  BP: (!) 128/51 (!) 126/53 (!) 129/50 130/60  Pulse: 78 79 81 87  Resp: 12 19 19 18   Temp:  98.1 F (36.7 C)    TempSrc:  Oral    SpO2: 96% 96% 95% 100%  Weight:      Height:        Intake/Output Summary (Last 24 hours) at 05/15/2020 0732 Last data filed at 05/15/2020 0600 Gross per 24 hour  Intake 1409.53 ml  Output 400 ml  Net 1009.53 ml   Last 3 Weights 05/14/2020 05/13/2020 05/12/2020  Weight (lbs) 180 lb 5.4 oz 177 lb 14.6 oz 175 lb 7.8 oz  Weight (kg) 81.8 kg 80.7 kg 79.6 kg      Telemetry    SR with some AF and PVCs; little A or V pacing - Personally Reviewed  ECG    SR Rate 82 1st HB, LBBB -  Personally Reviewed  Physical Exam   GEN: Chronically ill appearing, comfortable and NAD Neck: No JVD Cardiac: Regular rate and rhythm, III/VI holosystolic murmur without rubs, or gallops  Respiratory: poor respiratory effort but CTAB GI: Soft, nontender, non-distended  MS: able to move all extremities against gravity Neuro:  Nonfocal  Psych: Normal affect  Integument: Left sided PPM site without pocket hematoma  Labs    High Sensitivity Troponin:   Recent Labs  Lab 05/09/20 1307 05/09/20 1506  TROPONINIHS 81* 86*      Chemistry Recent Labs  Lab 05/09/20 1307 05/09/20 2039 05/09/20 2041 05/12/20 0401 05/13/20 0032 05/14/20 0051 05/15/20 0043  NA 139 134*   < > 135 134* 134* 135  K 4.3 4.6   < > 4.1 4.1 3.9 3.4*  CL 100 98   < > 100 100 99 101  CO2 23 24   < > 27 23 25 24   GLUCOSE 111* 161*   < > 91 87 99 181*  BUN 36* 36*   < > 37* 41* 38* 27*  CREATININE 2.97* 3.01*   < > 1.83* 1.91* 1.51* 1.08*  CALCIUM 9.1 9.3   < >  8.9 9.0 9.0 8.8*  PROT 6.7 7.1  --  6.0*  --   --   --   ALBUMIN 2.6* 2.7*  --  2.9*  --   --   --   AST 52* 54*  --  45*  --   --   --   ALT 24 24  --  21  --   --   --   ALKPHOS 136* 136*  --  90  --   --   --   BILITOT 7.3* 9.3*  --  6.1*  --   --   --   GFRNONAA 16* 16*   < > 29* 28* 37* 55*  ANIONGAP 16* 12   < > 8 11 10 10    < > = values in this interval not displayed.     Hematology Recent Labs  Lab 05/13/20 0032 05/14/20 0343 05/15/20 0043  WBC 4.8 6.0 4.4  RBC 3.20* 3.15* 3.21*  HGB 9.8* 9.9* 10.1*  HCT 30.8* 29.2* 29.7*  MCV 96.3 92.7 92.5  MCH 30.6 31.4 31.5  MCHC 31.8 33.9 34.0  RDW 18.3* 18.5* 18.6*  PLT 44* 54* 44*    BNP Recent Labs  Lab 05/09/20 1307  BNP 488.0*     DDimer No results for input(s): DDIMER in the last 168 hours.   Radiology    No results found.  Cardiac Studies   Echo 01/24/20: 1. Left ventricular ejection fraction, by estimation, is 35 to 40%. The  left ventricle has moderately  decreased function. The left ventricle  demonstrates global hypokinesis. Left ventricular diastolic function could  not be evaluated. There is severe  hypokinesis of the left ventricular, entire inferior wall and  inferolateral wall.   2. Right ventricular systolic function is mildly reduced. The right  ventricular size is normal. There is mildly elevated pulmonary artery  systolic pressure.   3. Left atrial size was severely dilated.   4. Right atrial size was severely dilated.   5. The mitral valve is normal in structure. Mild to moderate mitral valve  regurgitation.   6. Tricuspid valve regurgitation is moderate.   7. The aortic valve is tricuspid. Aortic valve regurgitation is not  visualized. Mild aortic valve sclerosis is present, with no evidence of  aortic valve stenosis.   8. The inferior vena cava is normal in size with <50% respiratory  variability, suggesting right atrial pressure of 8 mmHg.   Patient Profile     70 y.o. 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 On arrival to [facility], reportedly had episode of accelerated idioventricular rhythm-->10s pause in which CPR was almost initiated and then she developed a bradycardic rhythm consistent with CHB. Subsequently transferred to [facility] now s/p DC PPM.  Assessment & Plan    #HFrEF 35% # Mitral Regurgitation (mild to moderate) # AKI on CKI - Secondary to shock -TTE with stable LVEF 81-19%, diastolic and systolic MR and TR  - Creatinine continues to improve; approaching pre admission levels - holding MRA and torsemide; with this  patient is still on RA - encourage oral intake but no IVF;  likely DC on torsemide - transitioned to metoprolol succinate for HFrEF  #Multivessel CAD s/p CABG in 2001 with subsequent PCI: # HLD -NO ASA or Plavix with given thrombocytopenia and cirrhosis - stable platelets -GDMT limited due to cirrhosis and thrombocytopenia  -Continue zetia 57m daily - given limited options adding PRN Nitro for CP - BB as above  #Paroxysmal Afib: - Not on AC due to high risk of bleed with cirrhosis and thrombocytopenia. -BB as above  #Chronic Pain - Palliative Care consulted 05/12/20; appreciate recs for pain , GOC, and transitions - DNR as of 33/83/29 #Non-alcoholic cirrhosis: - Complicated by varices and thrombocytopenia. - Stable -No AC or DAPT at this time  #Complete Heart Block: #Accelerated Idioventricular Rhythm: -EP consulted, placed DC PPM; has presently signed off Has follow up   # PNA - Continuing 5 day Unasyn course (completed 05/14/20); ABX dc'ed per primary team - medicine consulted, appreciate recs  #COPD on home O2: -Continue supplemental O2 as needed (has at home) -Inhalers prn  DNR  Thin Diet Labs ordered SCDs for DVT prophylaxis Planned to transfer to floor today (ordered since 05/12/20) SNF Planning is on going reached out to SW team Discussed with nursing and pharmacy  ADDENUM: Nausea on opioids. JTc 349 ms.  Adding Zofran.  ADDENDUM 2:  Possible Dc tomorrow discussed with SW  For questions or updates, please contact CLake AnnPlease consult [email or web address] for contact info under        Signed, MWerner Lean MD  05/15/2020, 7:32 AM

## 2020-05-15 NOTE — Progress Notes (Signed)
Physical Therapy Treatment Patient Details Name: Samantha [name] MRN: [id number] DOB: [birth year] Today's Date: 05/15/2020    History of Present Illness 70 year old admitted 3/12 with falls, unresponsiveness and bradycardia with HR 30-40 pt with comlete heart block. Transvenous pacemaker placed 3/12 PPM placed 3/14. PMHx:  CAD s/p CABG x3, chronic systolic CHF with LVEF of 35 to 40%, NASH with cirrhosis and portal HTN, PAF, HTN, L1-L2 compression fracture in February 2022, chronic hypoxic respiratory failure on home O2    PT Comments    Pt up with nursing on arrival after BM and reports nausea throughout session with all VSS. Pt with decreased cognition and activity tolerance today and unable to progress ambulation distance with pt internally distracted. Will continue to work toward mobility progression acutely.   HR 85-91 97% on RA BP 142/56 (82)    Follow Up Recommendations  Supervision/Assistance - 24 hour;SNF     Equipment Recommendations  None recommended by PT    Recommendations for Other Services       Precautions / Restrictions Precautions Precautions: Fall;ICD/Pacemaker    Mobility  Bed Mobility               General bed mobility comments: in chair on arrival    Transfers Overall transfer level: Needs assistance   Transfers: Sit to/from Stand Sit to Stand: Min assist         General transfer comment: min assist with cues for Rt hand on surface and guarding LUE.  Ambulation/Gait Ambulation/Gait assistance: Min assist;+2 safety/equipment Gait Distance (Feet): 15 Feet Assistive device: Rolling walker (2 wheeled) Gait Pattern/deviations: Step-through pattern;Decreased stride length   Gait velocity interpretation: <1.8 ft/sec, indicate of risk for recurrent falls General Gait Details: cues for posture and proximity to RW with close chair follow. pt limited by fatigue and nausea   Stairs             Wheelchair Mobility    Modified Rankin  (Stroke Patients Only)       Balance Overall balance assessment: Needs assistance;History of Falls Sitting-balance support: No upper extremity supported;Feet supported Sitting balance-Leahy Scale: Fair Sitting balance - Comments: edge of chair with guarding   Standing balance support: Bilateral upper extremity supported Standing balance-Leahy Scale: Poor Standing balance comment: bil UE support on RW for standing                            Cognition Arousal/Alertness: Awake/alert Behavior During Therapy: Flat affect Overall Cognitive Status: Impaired/Different from baseline Area of Impairment: Following commands;Safety/judgement;Problem solving;Orientation;Memory;Attention                 Orientation Level: Disoriented to;Time Current Attention Level: Focused Memory: Decreased short-term memory;Decreased recall of precautions Following Commands: Follows one step commands consistently Safety/Judgement: Decreased awareness of safety;Decreased awareness of deficits   Problem Solving: Slow processing General Comments: pt not oriented to month or day, difficulty maintaining eyes open and looking for items on tray, confused by what she was trying to drink. Pt fixated on nausea with RN aware and present, no emesis and BM immediately prior to session      Exercises General Exercises - Lower Extremity Long Arc Quad: AAROM;Both;10 reps;Seated Hip Flexion/Marching: AAROM;Both;10 reps;Seated    General Comments        Pertinent Vitals/Pain Pain Assessment: 0-10 Pain Score: 4  Pain Location: bil LE pain Pain Descriptors / Indicators: Aching;Constant;Guarding;Cramping Pain Intervention(s): Limited activity within patient's tolerance;Monitored during session;Premedicated before session;Repositioned  Home Living                      Prior Function            PT Goals (current goals can now be found in the care plan section) Progress towards PT goals:  Not progressing toward goals - comment (limited by nausea and confusion)    Frequency    Min 3X/week      PT Plan Current plan remains appropriate    Co-evaluation              AM-PAC PT "6 Clicks" Mobility   Outcome Measure  Help needed turning from your back to your side while in a flat bed without using bedrails?: A Lot Help needed moving from lying on your back to sitting on the side of a flat bed without using bedrails?: A Lot Help needed moving to and from a bed to a chair (including a wheelchair)?: A Little Help needed standing up from a chair using your arms (e.g., wheelchair or bedside chair)?: A Little Help needed to walk in hospital room?: A Lot Help needed climbing 3-5 steps with a railing? : Total 6 Click Score: 13    End of Session   Activity Tolerance: Patient limited by fatigue Patient left: in chair;with call bell/phone within reach;with chair alarm set Nurse Communication: Mobility status PT Visit Diagnosis: Other abnormalities of gait and mobility (R26.89);Difficulty in walking, not elsewhere classified (R26.2);Muscle weakness (generalized) (M62.81)     Time: 8299-3716 PT Time Calculation (min) (ACUTE ONLY): 26 min  Charges:  $Therapeutic Exercise: 8-22 mins $Therapeutic Activity: 8-22 mins                     [place] Doig P, PT Acute Rehabilitation Services Pager: [phone] Office: [place] 05/15/2020, 10:13 AM

## 2020-05-15 NOTE — Progress Notes (Signed)
Patient transferred to [facility].  All belongings (glasses, dentures, phone and charger as well as pacemaker-tracker) went with patient to [address].  Family called and no answer.  Left message to patients whereabouts.  Report given to [place], [place]

## 2020-05-15 NOTE — Progress Notes (Signed)
Occupational Therapy Treatment Patient Details Name: Samantha [name] MRN: [id number] DOB: 06/30/1950 Today's Date: 05/15/2020    History of present illness 70 year old admitted 3/12 with falls, unresponsiveness and bradycardia with HR 30-40 pt with comlete heart block. Transvenous pacemaker placed 3/12 PPM placed 3/14. PMHx:  CAD s/p CABG x3, chronic systolic CHF with LVEF of 35 to 40%, NASH with cirrhosis and portal HTN, PAF, HTN, L1-L2 compression fracture in February 2022, chronic hypoxic respiratory failure on home O2   OT comments  Patient encouraged for out of bed, mobility around the foot of the bed for eventual BSC usage.  Patient needing up to Mod A for sit to stand, Min A of +2 for safety with mobility.  Constant verbal cues for RW management, and very slow pace.  Patient having increased difficulty with advancing her L foot.  Patient with max A for hygiene after BSC usage.  OT will continue to follow her in the acute setting to maximize functional status for transition to SNF for post acute rehab prior to home.    Follow Up Recommendations  SNF    Equipment Recommendations  3 in 1 bedside commode;Tub/shower bench    Recommendations for Other Services      Precautions / Restrictions Precautions Precautions: Fall;ICD/Pacemaker Restrictions Other Position/Activity Restrictions: limit shoulder flexion 90 degrees, and limit ABd and ER at L shoulder       Mobility Bed Mobility Overal bed mobility: Needs Assistance Bed Mobility: Supine to Sit     Supine to sit: Mod assist          Transfers Overall transfer level: Needs assistance   Transfers: Sit to/from Stand;Stand Pivot Transfers Sit to Stand: Min assist;Mod assist Stand pivot transfers: Min assist;+2 safety/equipment       General transfer comment: min assist with cues for Rt hand on surface and guarding LUE.    Balance Overall balance assessment: Needs assistance Sitting-balance support: No upper extremity  supported;Feet supported Sitting balance-Leahy Scale: Fair     Standing balance support: Bilateral upper extremity supported Standing balance-Leahy Scale: Poor Standing balance comment: bil UE support on RW for standing                           ADL either performed or assessed with clinical judgement   ADL                           Toilet Transfer: Minimal assistance;+2 for safety/equipment;RW;BSC   Toileting- Clothing Manipulation and Hygiene: Maximal assistance;Sit to/from stand       Functional mobility during ADLs: Minimal assistance;+2 for safety/equipment;Rolling walker                         Cognition Arousal/Alertness: Lethargic Behavior During Therapy: Flat affect Overall Cognitive Status: Impaired/Different from baseline                               Problem Solving: Slow processing                            Pertinent Vitals/ Pain       Pain Assessment: Faces Faces Pain Scale: Hurts a little bit Pain Location: bil LE pain Pain Descriptors / Indicators: Aching;Grimacing Pain Intervention(s): Monitored during session  Frequency  Min 2X/week        Progress Toward Goals  OT Goals(current goals can now be found in the care plan section)  Progress towards OT goals: Progressing toward goals  Acute Rehab OT Goals Patient Stated Goal: keep moving and hurt less OT Goal Formulation: With patient Time For Goal Achievement: 05/27/20 Potential to Achieve Goals: Good  Plan Discharge plan remains appropriate    Co-evaluation                 AM-PAC OT "6 Clicks" Daily Activity     Outcome Measure   Help from another person eating meals?: None Help from another person taking care of personal grooming?: A Little Help from another person toileting, which includes using toliet, bedpan, or urinal?: A Lot Help from  another person bathing (including washing, rinsing, drying)?: A Lot Help from another person to put on and taking off regular upper body clothing?: A Lot Help from another person to put on and taking off regular lower body clothing?: A Lot 6 Click Score: 15    End of Session Equipment Utilized During Treatment: Gait belt;Rolling walker  OT Visit Diagnosis: Unsteadiness on feet (R26.81);Dizziness and giddiness (R42);Pain Pain - Right/Left: Right   Activity Tolerance Patient limited by lethargy   Patient Left in chair;with call bell/phone within reach;with chair alarm set;with nursing/sitter in room   Nurse Communication          Time: 1435-1501 OT Time Calculation (min): 26 min  Charges: OT General Charges $OT Visit: 1 Visit OT Treatments $Self Care/Home Management : 8-22 mins $Therapeutic Activity: 8-22 mins  05/15/2020  Rich, OTR/L  Acute Rehabilitation Services  Office:  [phone]    Samantha [name] 05/15/2020, 3:06 PM

## 2020-05-15 NOTE — TOC Progression Note (Addendum)
Transition of Care [facility]) - Progression Note    Patient Details  Name: Samantha [name] MRN: [id number] Date of Birth: 11-30-1950  [facility] CM/SW [place], [place] Phone Number: 05/15/2020, 11:59 AM  Clinical Narrative:     CSW started insurance authorization for patient. Reference number is # [id number]. Requested start date is for tomorrow 3/19. CSW called and spoke with Tammi with [facility] to let her know that patient may dc over tomorrow. Tammi confirmed she can accept patient for SNF placement tomorrow if medically ready.  Patient has SNF bed at [facility] with palliative services to follow. Insurance authorization is pending.  CSW will continue to follow.    Expected Discharge Plan: [place] Barriers to Discharge: Continued Medical Work up  Expected Discharge Plan and Services Expected Discharge Plan: [place] In-house Referral: Clinical Social Work     Living arrangements for the past 2 months: Single Family Home                                       Social Determinants of Health (SDOH) Interventions    Readmission Risk Interventions Readmission Risk Prevention Plan 05/13/2020 02/19/2020  PCP or Specialist Appt within 3-5 Days - Complete  SW Recovery Care/Counseling Consult Complete -  [place] Complete -  Some recent data might be hidden

## 2020-05-16 DIAGNOSIS — K7290 Hepatic failure, unspecified without coma: Secondary | ICD-10-CM

## 2020-05-16 DIAGNOSIS — I5043 Acute on chronic combined systolic (congestive) and diastolic (congestive) heart failure: Secondary | ICD-10-CM | POA: Diagnosis not present

## 2020-05-16 DIAGNOSIS — N179 Acute kidney failure, unspecified: Secondary | ICD-10-CM | POA: Diagnosis not present

## 2020-05-16 DIAGNOSIS — E7220 Disorder of urea cycle metabolism, unspecified: Secondary | ICD-10-CM

## 2020-05-16 DIAGNOSIS — G9341 Metabolic encephalopathy: Secondary | ICD-10-CM

## 2020-05-16 DIAGNOSIS — K7460 Unspecified cirrhosis of liver: Secondary | ICD-10-CM | POA: Diagnosis not present

## 2020-05-16 DIAGNOSIS — I255 Ischemic cardiomyopathy: Secondary | ICD-10-CM | POA: Diagnosis not present

## 2020-05-16 DIAGNOSIS — I442 Atrioventricular block, complete: Secondary | ICD-10-CM | POA: Diagnosis not present

## 2020-05-16 LAB — CBC
HCT: 30.1 % — ABNORMAL LOW (ref 36.0–46.0)
Hemoglobin: 9.9 g/dL — ABNORMAL LOW (ref 12.0–15.0)
MCH: 31.2 pg (ref 26.0–34.0)
MCHC: 32.9 g/dL (ref 30.0–36.0)
MCV: 95 fL (ref 80.0–100.0)
Platelets: 44 10*3/uL — ABNORMAL LOW (ref 150–400)
RBC: 3.17 MIL/uL — ABNORMAL LOW (ref 3.87–5.11)
RDW: 19.4 % — ABNORMAL HIGH (ref 11.5–15.5)
WBC: 4.5 10*3/uL (ref 4.0–10.5)
nRBC: 0 % (ref 0.0–0.2)

## 2020-05-16 LAB — HEPATIC FUNCTION PANEL
ALT: 25 U/L (ref 0–44)
AST: 65 U/L — ABNORMAL HIGH (ref 15–41)
Albumin: 2.4 g/dL — ABNORMAL LOW (ref 3.5–5.0)
Alkaline Phosphatase: 90 U/L (ref 38–126)
Bilirubin, Direct: 2.8 mg/dL — ABNORMAL HIGH (ref 0.0–0.2)
Indirect Bilirubin: 6.3 mg/dL — ABNORMAL HIGH (ref 0.3–0.9)
Total Bilirubin: 9.1 mg/dL — ABNORMAL HIGH (ref 0.3–1.2)
Total Protein: 5.7 g/dL — ABNORMAL LOW (ref 6.5–8.1)

## 2020-05-16 LAB — BASIC METABOLIC PANEL
Anion gap: 8 (ref 5–15)
BUN: 18 mg/dL (ref 8–23)
CO2: 26 mmol/L (ref 22–32)
Calcium: 9 mg/dL (ref 8.9–10.3)
Chloride: 105 mmol/L (ref 98–111)
Creatinine, Ser: 0.98 mg/dL (ref 0.44–1.00)
GFR, Estimated: >60 mL/min (ref >60)
Glucose, Bld: 106 mg/dL — ABNORMAL HIGH (ref 70–99)
Potassium: 4.8 mmol/L (ref 3.5–5.1)
Sodium: 139 mmol/L (ref 135–145)

## 2020-05-16 LAB — AMMONIA: Ammonia: 54 umol/L — ABNORMAL HIGH (ref 9–35)

## 2020-05-16 MED ORDER — RIFAXIMIN 550 MG PO TABS
550.0000 mg | ORAL_TABLET | Freq: Two times a day (BID) | ORAL | Status: DC
  Administered 2020-05-16 – 2020-05-18 (×4): 550 mg via ORAL
  Filled 2020-05-16 (×5): qty 1

## 2020-05-16 MED ORDER — LACTULOSE 10 GM/15ML PO SOLN
10.0000 g | Freq: Two times a day (BID) | ORAL | Status: DC
  Filled 2020-05-16: qty 15

## 2020-05-16 MED ORDER — LACTULOSE 10 GM/15ML PO SOLN
20.0000 g | Freq: Three times a day (TID) | ORAL | Status: DC
  Administered 2020-05-16 – 2020-05-18 (×5): 20 g via ORAL
  Filled 2020-05-16 (×4): qty 30

## 2020-05-16 MED ORDER — LACTULOSE 10 GM/15ML PO SOLN: 10.0000 g | Freq: Every day | ORAL | Status: DC

## 2020-05-16 MED ORDER — EZETIMIBE 10 MG PO TABS
10.0000 mg | ORAL_TABLET | Freq: Every day | ORAL | Status: DC
  Administered 2020-05-17: 10 mg via ORAL
  Filled 2020-05-16 (×2): qty 1

## 2020-05-16 NOTE — Discharge Summary (Addendum)
Discharge Summary    Patient ID: Samantha [name] MRN: [id number]; DOB: 03-23-1950  Admit date: 05/09/2020 Discharge date: 05/19/2020  PCP:  [name], MD   [facility]  Cardiologist:  Sherren Mocha, MD   Advanced Practice Provider:  Liliane Shi, PA-C Electrophysiologist:  Vickie Epley, MD    Discharge Diagnoses    Principal Problem:   Acute metabolic encephalopathy Active Problems:   Coronary artery disease involving native coronary artery of native heart with angina pectoris [facility])   Essential hypertension   Cirrhosis, non-alcoholic ([place])   Cardiomyopathy, ischemic-35-40% by cath    Persistent atrial fibrillation   Thrombocytopenia ([place])   Pneumonia   Heart block AV complete ([place])   Acute on chronic combined systolic and diastolic CHF (congestive heart failure) ([place])   Paroxysmal ventricular tachycardia ([place]) - related to Bradycardia    Demand ischemia ([place])   S/P placement of cardiac pacemaker   AKI (acute kidney injury) ([place])   Oral thrush   Hyperbilirubinemia   Debility    Diagnostic Studies/Procedures     Echocardiogram 05/09/2020 1. Left ventricular ejection fraction, by estimation, is 35 to 40%. The  left ventricle has moderately decreased function. The left ventricle  demonstrates global hypokinesis and RWMA as noted in findings. There is  mild left ventricular hypertrophy. Left  ventricular diastolic parameters are indeterminate.  2. Respiratory related septal shift.  3. Right ventricular systolic function is mildly reduced. The right  ventricular size is normal. There is moderately elevated pulmonary artery  systolic pressure. The estimated right ventricular systolic pressure is  94.8 mmHg.  4. Left atrial size was moderately dilated.  5. Right atrial size was moderately dilated.  6. Diastolic and systolic mitral valve regurgitation.. The mitral valve  is grossly normal. Mild to moderate mitral valve  regurgitation.  7. Tricuspid valve regurgitation is moderate.  8. The aortic valve is grossly normal. There is mild calcification of the  aortic valve. Aortic valve regurgitation is not visualized. No aortic  stenosis is present.  9. The inferior vena cava is dilated in size with <50% respiratory  variability, suggesting right atrial pressure of 15 mmHg.   Temporary Pacemaker Placement 05/09/20 SUMMARY  Complete Heart Block with Junctional Escape Rhythm -> and intermittent AI VR  Successful right IJ Temporary Pacemaker Insertion-> pacing at the RVOT, best place for capture.  Upon insertion of the sheath, the patient went into AI VR with rates of the 110to 120s -> at that point I decided to forego right heart catheterization and just place the temporary pacemaker. => Temporary pacer used to overdrive pace out of AI VR. 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   _____________   History of Present Illness     Samantha [name] is a 70 y.o. female with history of multivesselCAD status post CABG in 2001 with subsequent PCIs,nonalcoholic cirrhosis with esophageal varices and thrombocytopenia,heart failure with reduced ejection fraction, hypertension, hyperlipidemia, paroxysmal atrial fibrillation (she is not on anticoagulation due to cirrhosis and varices), sick sinus syndrome (previously deemed to be a poor candidate for pacer), COPD on home O2.  She presented to [facility] after an episode of unresponsiveness.  She was found to be profoundly bradycardic in the field that was initially responsive to atropine. On arrival to [facility], she reportedly had episode of accelerated idioventricular rhythm followed by a 10 second pause.  She developed a bradycardic rhythm prior to CPR being started consistent with CHB. She was subsequently  transferred to [facility] for evaluation and management.     Hospital Course     Consultants: Internal Medicine; Electrophysiology    Complete heart  block Complete heart block was complicated by cardiogenic shock with lactic acidosis.  She required dopamine gtt.  She underwent urgent temporary pacer placement.  She improved and was eventually weaned off of dopamine.  She was seen by EP and ultimately underwent dual chamber pacemaker implantation ([employer]) with Dr. Quentin Ore.  Per Dr. Quentin Ore, it is recommended for hospice facility to peel off her Steri-Strip 1 week after discharge on 05/26/2020.  Acute kidney injury Her baseline SCr is ~1.15.  Her SCr increased here during her admission to 3.01.  As her cardiac output improved after her pacemaker was implanted, her SCr improved and was 0.98 on 05/16/2020.  Discharge creatinine 1.28.  (HFrEF) heart failure with reduced ejection fraction  Her diuretics were held in the setting of AKI.  She had patchy infiltrates on CT concerning for pneumonia vs CHF.  She was given 1 dose of IV Lasix.  Her echocardiogram demonstrated stable LVF with EF 35-40 and mild to mod MR. Patient initially underwent IV diuresis, later transitioned to torsemide and spironolactone.  Due to low blood pressure, spironolactone was removed prior to discharge.  She is being discharged on 20 mg daily of torsemide for symptom relief.  Coronary artery disease  She had mildly elevated hs-Troponin levels without clear trend.  Cardiac catheterization was deferred in the setting of AKI and chronic thrombocytopenia which limits ability to use antiplatelet Rx.      Pneumonia Chest CT did show patchy infiltrates concerning for pneumonia.  The patient was followed by internal medicine.  In the setting of lactic acidosis, she was covered for pneumonia with broad spectrum antibiotics (Unasyn).  She completed 5 days of Rx and internal medicine signed off.  Non-alcoholic cirrhosis with acute hepatic encephalopathy Total bilirubin was up to 9.3 and improved to 6.1 on 3/15.  She is managed by GI as an outpatient.  She was getting ready for DC to SNF  but was noted to be more lethargic with asterixis.  Ammonia level and LFTs were obtained and internal medicine was asked to see the patient again.  Due to elevated significant hepatic encephalopathy, GI service increased her lactulose and started on xifaxan.  Despite treatment, her mental status continued to wax and wane during the hospitalization.  On some days, she can communicate very well, on other days she appears to be more sleepy. Her lactulose 20g TID was stopped by palliative care as patient made comfort care. Rifaxan also stopped.   DNR She was initially made full code when she was admitted and need a pacemaker.  Her overall case was discussed again with the patient and her family. She was seen by palliative medicine and was again made DNR.  Long discussion has been held with family and the patient, patient opted for comfort care and to discharge to hospice of [place].  Her overall condition will likely continue to deteriorate in the future.  Dispo She was seen by PT and it was recommended she be DC to hospice of [place].  She was seen by palliative care service, long discussion has been held with the patient and the family member, patient has been made comfort care.  No aggressive medical therapy   Did the patient have an acute coronary syndrome (MI, NSTEMI, STEMI, etc) this admission?:  No.   The elevated Troponin was due to the  acute medical illness (demand ischemia).      _____________  Discharge Vitals Blood pressure (!) 98/46, pulse 92, temperature 98.4 F (36.9 C), temperature source Oral, resp. rate 14, height 5' 2"  (1.575 m), weight 80.5 kg, last menstrual period 03/29/2011, SpO2 98 %.  Filed Weights   05/14/20 0500 05/17/20 0454 05/18/20 0458  Weight: 81.8 kg 79.1 kg 80.5 kg    Labs & Radiologic Studies    CBC Recent Labs    05/17/20 0204 05/18/20 0433  WBC 6.2 5.4  NEUTROABS  --  3.2  HGB 9.8* 11.8*  HCT 29.4* 35.2*  MCV 94.8 94.4  PLT 56* 44*   Basic  Metabolic Panel Recent Labs    05/17/20 0204 05/18/20 0433  NA 136 135  K 4.1 3.7  CL 102 102  CO2 26 25  GLUCOSE 78 83  BUN 21 23  CREATININE 1.09* 1.28*  CALCIUM 8.9 8.9  MG  --  1.6*   Liver Function Tests Recent Labs    05/18/20 0433  AST 58*  ALT 27  ALKPHOS 99  BILITOT 8.7*  PROT 6.0*  ALBUMIN 2.6*   No results for input(s): LIPASE, AMYLASE in the last 72 hours. High Sensitivity Troponin:   Recent Labs  Lab 05/09/20 1307 05/09/20 1506  TROPONINIHS 81* 86*    BNP Invalid input(s): POCBNP D-Dimer No results for input(s): DDIMER in the last 72 hours. Hemoglobin A1C No results for input(s): HGBA1C in the last 72 hours. Fasting Lipid Panel No results for input(s): CHOL, HDL, LDLCALC, TRIG, CHOLHDL, LDLDIRECT in the last 72 hours. Thyroid Function Tests No results for input(s): TSH, T4TOTAL, T3FREE, THYROIDAB in the last 72 hours.  Invalid input(s): FREET3 _____________  DG Chest 2 View  Result Date: 05/12/2020 CLINICAL DATA:  Post pacer placement EXAM: CHEST - 2 VIEW COMPARISON:  05/11/2020 FINDINGS: Interval placement of left chest wall dual lead pacemaker. Leads overlie the right atrium and right ventricle. No pneumothorax. Removal of temporary pacemaker with right IJ introducer remaining present. Similar mild pulmonary edema and cardiomegaly. IMPRESSION: Post pacemaker placement without complication. Similar mild pulmonary edema and cardiomegaly. Electronically Signed   By: Macy Mis M.D.   On: 05/12/2020 08:15   CT CHEST WO CONTRAST  Result Date: 05/10/2020 CLINICAL DATA:  History of pneumonia by report. Multifocal opacities on prior imaging. EXAM: CT CHEST WITHOUT CONTRAST TECHNIQUE: Multidetector CT imaging of the chest was performed following the standard protocol without IV contrast. COMPARISON:  May 17, 2019. FINDINGS: Cardiovascular: RIGHT-sided transvenous pacer terminates in the pulmonary artery outflow tract in the proximal main pulmonary  artery. Calcified atheromatous plaque of the thoracic aorta with normal caliber. Three-vessel coronary artery disease. Heart size is enlarged without pericardial effusion. Central pulmonary vasculature engorged unchanged from prior imaging. Limited assessment of cardiovascular structures given lack of intravenous contrast. Mediastinum/Nodes: No adenopathy in the mediastinum. Patulous esophagus with debris in the lumen. Scattered lymph nodes throughout the chest mildly prominent without pathologic enlargement. Lungs/Pleura: Patchy areas of airspace opacity throughout the chest. Nodule in the RIGHT lung base (image 114, series 8. Mainly ground-glass. 13 mm nodular ground-glass focus in the RIGHT lung base on image 103. Scattered ground-glass nodules essentially throughout the chest slightly more numerous in the mid chest though distributed in the upper lobes as well. Septal thickening. Basilar atelectasis. Upper Abdomen: Lobular hepatic contours. Cholelithiasis without pericholecystic stranding. Signs of portosystemic collateral suggested in the upper abdomen not well evaluated. Musculoskeletal: Cement augmentation of the L1 vertebral level seen on  prior imaging. Osteopenia.  Signs of sternotomy. Worsening loss of height about the L2. Approximately 40-50% loss of height at L2, progressed from previous imaging where there was less than 40% loss of height. Superior endplate of L3 also with signs of fracture, likely new. L3 incompletely imaged. IMPRESSION: 1. Patchy areas of airspace opacity throughout the chest with basilar atelectasis. Findings could be seen in the setting of multifocal pneumonia, likely viral or atypical process. Given the patulous debris-filled esophagus would also entertained the possibility of a component of aspiration pneumonitis though findings are distributed throughout the lungs rather than at the basilar portions of the chest. 2. Transvenous pacer device is in the pulmonary artery outflow  tract. Correlate with pacer function and consider repositioning as warranted. 3. Worsening of lumbar spine compression fractures at L2 and new compression fracture at L3. Loss of height is greatest at L2 as described with prior cement augmentation of L1. L3 is incompletely imaged. 4. Three-vessel coronary artery disease. 5. Patulous esophagus with debris in the lumen this may reflect reflux or esophageal dysmotility. 6. Signs of liver disease. 7. Cholelithiasis. 8. Aortic atherosclerosis. These results will be called to the ordering clinician or representative by the Radiologist Assistant, and communication documented in the PACS or [employer]. Aortic Atherosclerosis (ICD10-I70.0). Electronically Signed   By: Zetta Bills M.D.   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  Upon insertion of the sheath, the patient went into AI VR with rates of the 110to 120s -> at that point I decided to forego right heart catheterization and just place the temporary pacemaker. => Temporary pacer used to overdrive pace out of AI VR. 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 RECOMMENDATIONS  Return to nursing unit, continue on dopamine wean as tolerated for blood pressures.  Patient is now essentially pacemaker dependent-would need backup transcutaneous pacing in place if TPM becomes unstable We will need formal EP evaluation to determine timing of PPM placement. Glenetta Hew, MD   DG Chest Port 1 View  Result Date: 05/11/2020 CLINICAL DATA:  Congestive heart failure and heart block. EXAM: PORTABLE CHEST 1 VIEW COMPARISON:  05/10/2020 FINDINGS: Stable cardiac enlargement. Right jugular transvenous pacemaker demonstrates stable position with the tip near the expected region of the pulmonary outflow tract.  Pulmonary edema pattern improved since the prior chest x-ray. No pleural effusions or pneumothorax. IMPRESSION: Improving pulmonary edema. Stable cardiomegaly. Electronically Signed   By: Aletta Edouard M.D.   On: 05/11/2020 08:10   DG CHEST PORT 1 VIEW  Result Date: 05/10/2020 CLINICAL DATA:  Shortness of breath and chest pain EXAM: PORTABLE CHEST 1 VIEW COMPARISON:  February 17, 2020 FINDINGS: There is cardiomegaly with pulmonary vascularity within normal limits. Patient is status post coronary artery bypass grafting. Apparent pacemaker lead in right atrial region. There is ill-defined airspace opacity in each upper lobe and left base region. No consolidation. No appreciable adenopathy. There is aortic atherosclerosis. No bone lesions. IMPRESSION: Ill-defined airspace opacity in each upper lobe and left base regions. Appearance felt to be indicative of multifocal pneumonia. Question atypical organism pneumonia. Check of COVID-19 status advised. Cardiomegaly. Status post coronary artery bypass grafting. Apparent pacemaker via trans jugular approach with lead attached to right heart, likely right atrium. Aortic Atherosclerosis (ICD10-I70.0). Electronically Signed   By: Lowella Grip III  M.D.   On: 05/10/2020 10:53   ECHOCARDIOGRAM COMPLETE  Result Date: 05/09/2020    ECHOCARDIOGRAM REPORT   Patient Name:   Samantha [name] Date of Exam: 05/09/2020 Medical Rec #:  [id number]      Height:       62.0 in Accession #:    [id number]     Weight:       163.0 lb Date of Birth:  1950-12-13      BSA:          1.753 m Patient Age:    13 years       BP:           105/42 mmHg Patient Gender: F              HR:           46 bpm. Exam Location:  Inpatient Procedure: 2D Echo, Cardiac Doppler, Color Doppler and Intracardiac            Opacification Agent STAT ECHO Indications:    Complete heart block  History:        Patient has prior history of Echocardiogram examinations, most                 recent 01/24/2020. CHF, CAD,  Prior CABG, PAD and COPD,                 Arrythmias:Atrial Flutter; Risk Factors:Diabetes, Hypertension                 and Dyslipidemia.  Sonographer:    Dustin Flock Referring Phys: [id number] [place]  1. Left ventricular ejection fraction, by estimation, is 35 to 40%. The left ventricle has moderately decreased function. The left ventricle demonstrates global hypokinesis and RWMA as noted in findings. There is mild left ventricular hypertrophy. Left ventricular diastolic parameters are indeterminate.  2. Respiratory related septal shift.  3. Right ventricular systolic function is mildly reduced. The right ventricular size is normal. There is moderately elevated pulmonary artery systolic pressure. The estimated right ventricular systolic pressure is 25.0 mmHg.  4. Left atrial size was moderately dilated.  5. Right atrial size was moderately dilated.  6. Diastolic and systolic mitral valve regurgitation.. The mitral valve is grossly normal. Mild to moderate mitral valve regurgitation.  7. Tricuspid valve regurgitation is moderate.  8. The aortic valve is grossly normal. There is mild calcification of the aortic valve. Aortic valve regurgitation is not visualized. No aortic stenosis is present.  9. The inferior vena cava is dilated in size with <50% respiratory variability, suggesting right atrial pressure of 15 mmHg. FINDINGS  Left Ventricle: Left ventricular ejection fraction, by estimation, is 35 to 40%. The left ventricle has moderately decreased function. The left ventricle demonstrates global hypokinesis. Definity contrast agent was given IV to delineate the left ventricular endocardial borders. The left ventricular internal cavity size was normal in size. There is mild left ventricular hypertrophy. Abnormal (paradoxical) septal motion, consistent with left bundle branch block. Left ventricular diastolic parameters are indeterminate.  LV Wall Scoring: The inferior wall, posterior  wall, and mid inferoseptal segment are akinetic. The mid anteroseptal segment is hypokinetic. Right Ventricle: The right ventricular size is normal. No increase in right ventricular wall thickness. Right ventricular systolic function is mildly reduced. There is moderately elevated pulmonary artery systolic pressure. The tricuspid regurgitant velocity is 3.08 m/s, and with an assumed right atrial pressure of 15 mmHg, the estimated right ventricular systolic pressure is 53.9 mmHg.  Left Atrium: Left atrial size was moderately dilated. Right Atrium: Right atrial size was moderately dilated. Pericardium: There is no evidence of pericardial effusion. Mitral Valve: Diastolic and systolic mitral valve regurgitation. The mitral valve is grossly normal. Mild to moderate mitral valve regurgitation. MV peak gradient, 16.6 mmHg. The mean mitral valve gradient is 3.0 mmHg with average heart rate of 47 bpm. Tricuspid Valve: The tricuspid valve is normal in structure. Tricuspid valve regurgitation is moderate . No evidence of tricuspid stenosis. Aortic Valve: The aortic valve is grossly normal. There is mild calcification of the aortic valve. Aortic valve regurgitation is not visualized. No aortic stenosis is present. Aortic valve mean gradient measures 8.0 mmHg. Aortic valve peak gradient measures 19.0 mmHg. Aortic valve area, by VTI measures 3.12 cm. Pulmonic Valve: The pulmonic valve was normal in structure. Pulmonic valve regurgitation is trivial. Aorta: The aortic root is normal in size and structure. Venous: The inferior vena cava is dilated in size with less than 50% respiratory variability, suggesting right atrial pressure of 15 mmHg. IAS/Shunts: No atrial level shunt detected by color flow Doppler.  LEFT VENTRICLE PLAX 2D LVIDd:         5.20 cm  Diastology LVIDs:         4.70 cm  LV e' medial:    6.09 cm/s LV PW:         1.20 cm  LV E/e' medial:  26.3 LV IVS:        1.20 cm  LV e' lateral:   9.68 cm/s LVOT diam:     2.30  cm  LV E/e' lateral: 16.5 LV SV:         133 LV SV Index:   76 LVOT Area:     4.15 cm  RIGHT VENTRICLE RV Basal diam:  3.30 cm RV S prime:     9.79 cm/s TAPSE (M-mode): 2.1 cm LEFT ATRIUM             Index       RIGHT ATRIUM           Index LA diam:        4.90 cm 2.80 cm/m  RA Area:     22.90 cm LA Vol (A2C):   70.9 ml 40.46 ml/m RA Volume:   75.40 ml  43.02 ml/m LA Vol (A4C):   69.3 ml 39.54 ml/m LA Biplane Vol: 73.7 ml 42.05 ml/m  AORTIC VALVE                    PULMONIC VALVE AV Area (Vmax):    2.65 cm     PV Vmax:       1.86 m/s AV Area (Vmean):   2.78 cm     PV Peak grad:  13.8 mmHg AV Area (VTI):     3.12 cm AV Vmax:           218.00 cm/s AV Vmean:          131.000 cm/s AV VTI:            0.425 m AV Peak Grad:      19.0 mmHg AV Mean Grad:      8.0 mmHg LVOT Vmax:         139.00 cm/s LVOT Vmean:        87.700 cm/s LVOT VTI:          0.319 m LVOT/AV VTI ratio: 0.75  AORTA Ao Root diam: 2.90 cm MITRAL VALVE  TRICUSPID VALVE MV Area (PHT): 7.16 cm     TR Peak grad:   37.9 mmHg MV Area VTI:   2.12 cm     TR Vmax:        308.00 cm/s MV Peak grad:  16.6 mmHg MV Mean grad:  3.0 mmHg     SHUNTS MV Vmax:       2.04 m/s     Systemic VTI:  0.32 m MV Vmean:      91.0 cm/s    Systemic Diam: 2.30 cm MV Decel Time: 106 msec MV E velocity: 160.00 cm/s MV A velocity: 131.00 cm/s MV E/A ratio:  1.22 Cherlynn Kaiser MD Electronically signed by Cherlynn Kaiser MD Signature Date/Time: 05/09/2020/5:23:45 PM    Final    Disposition   Pt is being discharged to [facility] information for follow-up providers    Vickie Epley, MD Follow up.   Specialties: Cardiology, Radiology Why: 08/14/20 @ 2:15PM Contact information: [place] [address] [phone]            Contact information for after-discharge care    [facility] .   Service: Skilled Nursing Contact  information: [address] [place] [phone]                 Discharge Instructions    Diet - low sodium heart healthy   Complete by: As directed    Increase activity slowly   Complete by: As directed    No wound care   Complete by: As directed       Discharge Medications   Allergies as of 05/19/2020      Reactions   Entresto [sacubitril-valsartan] Swelling   Swelling of eyes   Vibra-tab [doxycycline] Nausea Only   Acetaminophen Other (See Comments)   Liver problems   Ace Inhibitors Other (See Comments)   UNSURE OF REACTION TYPE   Cefaclor Itching, Rash   Pt tolerates meropenem   Cephalexin Itching, Rash   Pt tolerates meropenem   Penicillins Rash   Tolerated Unasyn Has patient had a PCN reaction causing immediate rash, facial/tongue/throat swelling, SOB or lightheadedness with hypotension: YES Has patient had a PCN reaction causing severe rash involving mucus membranes or skin necrosis: NO Has patient had a PCN reaction that required hospitalization: YES Has patient had a PCN reaction occurring within the last 10 years: NO If all of the above answers are "NO", then may proceed with Cephalosporin use. Pt tolerates meropenem 02/19/20   Pregabalin Other (See Comments)   Retains fluid   Tape Itching, Rash   Takes skin right off with medical tape--PAPER TAPE ONLY      Medication List    STOP taking these medications   ezetimibe 10 MG tablet Commonly known as: ZETIA   metoprolol tartrate 25 MG tablet Commonly known as: LOPRESSOR   polyethylene glycol 17 g packet Commonly known as: MIRALAX / GLYCOLAX   potassium chloride SA 20 MEQ tablet Commonly known as: KLOR-CON   senna-docusate 8.6-50 MG tablet Commonly known as: Senokot-S   spironolactone 25 MG tablet Commonly known as: ALDACTONE     TAKE these medications   albuterol 108 (90 Base) MCG/ACT inhaler Commonly known as: VENTOLIN HFA Inhale 2 puffs into the lungs  every 6 (six) hours as needed for wheezing or shortness of breath.   FLUoxetine 20  MG capsule Commonly known as: PROZAC Take 20 mg by mouth daily.   ipratropium 0.03 % nasal spray Commonly known as: ATROVENT Place 1 spray into both nostrils daily as needed (allergies).   lidocaine 5 % Commonly known as: LIDODERM Place 1 patch onto the skin daily. Remove & Discard patch within 12 hours or as directed by MD   LORazepam 2 MG tablet Commonly known as: ATIVAN Take 1 tablet (2 mg total) by mouth every 4 (four) hours as needed for anxiety.   magic mouthwash w/lidocaine Soln Take 10 mLs by mouth 4 (four) times daily.   methocarbamol 500 MG tablet Commonly known as: Robaxin Take 1 tablet (500 mg total) by mouth 3 (three) times daily.   Nitrostat 0.4 MG SL tablet Generic drug: nitroGLYCERIN Place 0.4 mg under the tongue every 5 (five) minutes as needed for chest pain (x 3 tabs daily).   ondansetron 4 MG disintegrating tablet Commonly known as: Zofran ODT Take 1 tablet (4 mg total) by mouth every 8 (eight) hours as needed for nausea or vomiting.   Oxycodone HCl 10 MG Tabs Take 1 tablet (10 mg total) by mouth every 4 (four) hours as needed. What changed: reasons to take this   pantoprazole 40 MG tablet Commonly known as: PROTONIX Take 40 mg by mouth daily.   torsemide 20 MG tablet Commonly known as: DEMADEX Take 1 tablet (20 mg total) by mouth daily. What changed: how much to take          Outstanding Labs/Studies   N/A  Duration of Discharge Encounter   Greater than 30 minutes including physician time.  Hilbert Corrigan, [place] 05/19/2020, 4:48 PM

## 2020-05-16 NOTE — Progress Notes (Addendum)
Progress Note  Patient Name: Samantha [name] Date of Encounter: 05/16/2020  Primary Cardiologist: Sherren Mocha, MD  Subjective   Patient eating breakfast.  Relatively fatigued but no specific complaints as far as chest discomfort or breathlessness.  Inpatient Medications    Scheduled Meds: . Chlorhexidine Gluconate Cloth  6 each Topical Daily  . DULoxetine  20 mg Oral QHS  . ezetimibe  10 mg Oral QHS  . guaiFENesin  600 mg Oral BID  . lidocaine  1 patch Transdermal Q24H  . methocarbamol  500 mg Oral TID  . metoprolol succinate  25 mg Oral Daily  . oxyCODONE  20 mg Oral Q12H  . pantoprazole  40 mg Oral Daily  . senna-docusate  2 tablet Oral QHS  . sodium chloride flush  10-40 mL Intracatheter Q12H  . sodium chloride flush  3 mL Intravenous Q12H  . sodium chloride flush  3 mL Intravenous Q12H   Continuous Infusions: . sodium chloride 10 mL/hr at 05/15/20 0200  . sodium chloride 999 mL/hr at 05/11/20 1646   PRN Meds: sodium chloride, sodium chloride, albuterol, ipratropium, nitroGLYCERIN, ondansetron, oxyCODONE, pneumococcal 23 valent vaccine, sodium chloride flush, sodium chloride flush, sodium chloride flush   Vital Signs    Vitals:   05/16/20 0054 05/16/20 0500 05/16/20 0814 05/16/20 0904  BP: (!) 120/47 (!) 90/43 (!) 110/48 (!) 110/48  Pulse: 81 81 86 89  Resp: 16 17 16    Temp: 98.1 F (36.7 C) 97.9 F (36.6 C) 97.9 F (36.6 C)   TempSrc: Axillary Axillary Axillary   SpO2: 97% 96% 97%   Weight:      Height:       No intake or output data in the 24 hours ending 05/16/20 0924 Filed Weights   05/12/20 0500 05/13/20 0500 05/14/20 0500  Weight: 79.6 kg 80.7 kg 81.8 kg    Telemetry    Sinus rhythm with rare intermittent paced beats.  Personally reviewed.  ECG    An ECG dated 05/14/2020 was personally reviewed today and demonstrated:  Sinus rhythm with left bundle branch block and PAC.  Physical Exam   GEN:  Chronically ill-appearing.  No acute  distress.   Neck: No JVD. Cardiac: RRR, 3/6 holosystolic murmur, rub, or gallop.  Respiratory: Nonlabored.  Decreased breath sounds. GI: Soft, nontender, bowel sounds present.  Jaundiced. MS: No edema; No deformity. Neuro:  Nonfocal. Psych: Alert and oriented x 3.  Diffusely weak.  Labs    Chemistry Recent Labs  Lab 05/09/20 1307 05/09/20 2039 05/09/20 2041 05/12/20 0401 05/13/20 0032 05/14/20 0051 05/15/20 0043 05/16/20 0230  NA 139 134*   < > 135   < > 134* 135 139  K 4.3 4.6   < > 4.1   < > 3.9 3.4* 4.8  CL 100 98   < > 100   < > 99 101 105  CO2 23 24   < > 27   < > 25 24 26   GLUCOSE 111* 161*   < > 91   < > 99 181* 106*  BUN 36* 36*   < > 37*   < > 38* 27* 18  CREATININE 2.97* 3.01*   < > 1.83*   < > 1.51* 1.08* 0.98  CALCIUM 9.1 9.3   < > 8.9   < > 9.0 8.8* 9.0  PROT 6.7 7.1  --  6.0*  --   --   --   --   ALBUMIN 2.6* 2.7*  --  2.9*  --   --   --   --   AST 52* 54*  --  45*  --   --   --   --   ALT 24 24  --  21  --   --   --   --   ALKPHOS 136* 136*  --  90  --   --   --   --   BILITOT 7.3* 9.3*  --  6.1*  --   --   --   --   GFRNONAA 16* 16*   < > 29*   < > 37* 55* >60  ANIONGAP 16* 12   < > 8   < > 10 10 8    < > = values in this interval not displayed.     Hematology Recent Labs  Lab 05/14/20 0343 05/15/20 0043 05/16/20 0230  WBC 6.0 4.4 4.5  RBC 3.15* 3.21* 3.17*  HGB 9.9* 10.1* 9.9*  HCT 29.2* 29.7* 30.1*  MCV 92.7 92.5 95.0  MCH 31.4 31.5 31.2  MCHC 33.9 34.0 32.9  RDW 18.5* 18.6* 19.4*  PLT 54* 44* 44*    Cardiac Enzymes Recent Labs  Lab 05/09/20 1307 05/09/20 1506  TROPONINIHS 81* 86*    BNP Recent Labs  Lab 05/09/20 1307  BNP 488.0*     Radiology    No results found.  Cardiac Studies   Echocardiogram 05/09/2020: 1. Left ventricular ejection fraction, by estimation, is 35 to 40%. The  left ventricle has moderately decreased function. The left ventricle  demonstrates global hypokinesis and RWMA as noted in findings. There  is  mild left ventricular hypertrophy. Left  ventricular diastolic parameters are indeterminate.  2. Respiratory related septal shift.  3. Right ventricular systolic function is mildly reduced. The right  ventricular size is normal. There is moderately elevated pulmonary artery  systolic pressure. The estimated right ventricular systolic pressure is  88.4 mmHg.  4. Left atrial size was moderately dilated.  5. Right atrial size was moderately dilated.  6. Diastolic and systolic mitral valve regurgitation.. The mitral valve  is grossly normal. Mild to moderate mitral valve regurgitation.  7. Tricuspid valve regurgitation is moderate.  8. The aortic valve is grossly normal. There is mild calcification of the  aortic valve. Aortic valve regurgitation is not visualized. No aortic  stenosis is present.  9. The inferior vena cava is dilated in size with <50% respiratory  variability, suggesting right atrial pressure of 15 mmHg.   Patient Profile     70 y.o. female with history of multivesselCAD status post CABG in 2001 with subsequent PCIs,nonalcoholic cirrhosis with esophageal varices and thrombocytopenia,heart failure with reduced ejection fraction as low as 20 to 35%, HTN, HLD, Afib not on AC due to cirrhosis and varices, SSS, COPD on home O2 who presented to [facility] with an episode of unresponsiveness found to be profoundly bradycardic in the field initially responsive to atropine. On arrival to [facility], reportedly had episode of accelerated idioventricular rhythm-->10s pause in which CPR was almost initiated and then she developed a bradycardic rhythm consistent with CHB. Subsequently transferred to [facility] now s/p DC PPM.  Assessment & Plan    1.  Chronic systolic heart failure, LVEF 35 to 40% by most recent assessment, also mild RV dysfunction.  Currently on Toprol-XL, Aldactone and Demadex have been held during hospital stay due to acute renal insufficiency, creatinine back to  baseline.  2.  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  Patient is now status post placement of dual-chamber pacemaker, [employer] MRI PM 2272 on March 14 by Dr. Quentin Ore.  3.  Paroxysmal atrial fibrillation.  CHA2DS2-VASc score is 6 however she is not anticoagulated with concerns about bleeding in the setting of cirrhosis with esophageal varices and thrombocytopenia.  4.  CAD status post CABG in 2001 with subsequent PCI, no active angina with plan to continue medical therapy.  She is not on antiplatelet therapy in the setting of thrombocytopenia.  Currently on Zetia for lipid management and beta-blocker.  5.  Patient currently DNR status with plan to transition to SNF with palliative care and potentially hospice.  6.  Nonalcoholic cirrhosis with esophageal varices.  Patient jaundiced and weak.  AST and ALT 45 and 21 on March 15 with total bilirubin 6.1.  Discharge to [facility] with palliative care was planned for today.  Cardiac status overall stable, but per nursing she is now more jaundiced and weak with asterixis.  This is my first day rounding on her so not sure of baseline.  Also, with plan for palliative care and transition to hospice, not certain about treatment options.  Internal medicine service had been following.  Discussed with Mr. Jorene Minors.  We will obtain ammonia level and repeat LFTs, reconsult internal medicine.  Cancel discharge for now.  Signed, Rozann Lesches, MD  05/16/2020, 9:24 AM

## 2020-05-16 NOTE — TOC Progression Note (Addendum)
Transition of Care [facility]) - Progression Note    Patient Details  Name: Samantha [name] MRN: [id number] Date of Birth: [birth year]  Transition of Care [facility]) CM/SW [place], [place] Phone Number: [phone] 05/16/2020, 9:56 AM  Clinical Narrative:    CSW called [facility] to receive an update about patient's insurance authorization. Authorization has been approved.  Everlene Balls #- [id number] Health Plan #- [id number] Case Manager: Herbert Pun Authorization dates: 3/19-3/22 Fax Number: [phone]   10;15am CSW attempted to call Morey Hummingbird and Tammie at the [facility] and left messages. Awaiting a call back.  TOC team will continue to assist with discharge planning needs.   Expected Discharge Plan: [place] Barriers to Discharge: Continued Medical Work up  Expected Discharge Plan and Services Expected Discharge Plan: [place] In-house Referral: Clinical Social Work     Living arrangements for the past 2 months: Single Family Home                                       Social Determinants of Health (SDOH) Interventions    Readmission Risk Interventions Readmission Risk Prevention Plan 05/13/2020 02/19/2020  PCP or Specialist Appt within 3-5 Days - Complete  SW Recovery Care/Counseling Consult Complete -  [place] Complete -  Some recent data might be hidden

## 2020-05-16 NOTE — Progress Notes (Addendum)
Progress Note    Samantha [name]  IWL:[id number] DOB: [birth year]  DOA: 05/09/2020 PCP: [name], MD  Outpatient specialist: Dr. Laural Golden -GI  Brief Narrative:   Chief complaint: Bradycardia  Medical records reviewed and are as summarized below:  Samantha [name] is an 70 y.o. female 70 year old female with history of CAD status post CABG x3, chronic systolic CHF with LVEF of 35 to 40% in January 2021, NASH with cirrhosis and portal hypertension, PAF, hypertension, L1-L2 compression fracture in February 2022, chronic hypoxic respiratory failure on home oxygen as needed presented with multiple falls, unresponsiveness and symptomatic bradycardia with heart rates in the 30s-40s. She was transferred from [facility] to [facility] under cardiology care. She was started on dopamine drip and underwent transvenous pacemaker. TRH service was consulted on 05/10/2020 for determination of CHF versus pneumonia. Reconsult on 05/16/2020 for lethargy and confusion.  Assessment/Plan:   Heart block: Patient underwent permanent pacemaker placement on 3/14. -Management per EP and cardiology  Cardiogenic shock acute on chronic systolic CHF PAF multivessel CAD s/p CABG in 2001. Patient had temporarily been on a dopamine drip but had been able to be weaned off.  She is not on diuretics or anticoagulation due to history of prior GI bleeds.  Palliative care medicine have been formally consulted for goals of care and patient was made DNR.  Acute metabolic encephalopathy(question hepatic)  hyperammonia anemia  NASH liver cirrhosis with varices: Patient was noted to be more lethargic and confused.  Reports seeing ants on the bed.  Found to have mildly elevated ammonia level of 54 and total bilirubin 9.1 with indirect 6.3.  Meld score 23 with 19.6% chance of 78-monthmortality.  [place]  Patient and family requesting formal GI consult.  However this likely  could be multifactorial in the setting of high-dose pain medications and liver disease. -Neurochecks -Lactulose 10 g twice daily -Titrate to 3 semisolid stools per day -[facility] formally consulted, follow-up for any further recommendations -Also may consider decreasing scheduled pain medications.  Hyperbilirubinemia and elevated AST: Acute on chronic.  AST 65 and total bilirubin 9.1 with indirect bilirubin 6.3. -Continue to monitor  Pneumonia, express opacities: CT of the chest showed patchy areas of airspace opacities suggestive of multifocal pneumonia viral or atypical could not be ruled out.  Procalcitonin was<0.1 for which pneumonia was likely not bacterial.  IV Unasyn was given continue to complete 5 days.  O2 saturations currently maintained on room air.  Acute kidney injury on CKD stage IIIa: Resolved.  Creatinine on admission was 2.9 with concern for cardiorenal syndrome.  However since admission kidney function had improved.  Creatinine currently 0.98. -Continue to monitor  Generalized debility: Plan is for patient to go to skilled nursing facility  Thrombocytopenia: Chronic.  DNR: Palliative care have been formally consulted made patient DNR with plans to transfer to SNF and hopefully home with hospice at some point in time.  Body mass index is 32.98 kg/m.   Family Communication/Anticipated D/C date and plan/Code Status   DVT prophylaxis: SCDs Code Status: DNR Family Communication: Discussed plan of care with the patient's brother and sister-in-law present at bedside Disposition Plan: Plan is to discharge to SNF with palliative care   Medical Consultants:    GI   Anti-Infectives:    None  Subjective:   Patient complains of being more confused and lethargic.  Complains of ants walking across her bed.  Objective:    Vitals:   05/16/20 0500 05/16/20 0814 05/16/20 0904 05/16/20 1055  BP: (!) 90/43 (!) 110/48 (!) 110/48 (!) 99/48  Pulse: 81 86 89 82   Resp: 17 16  16   Temp: 97.9 F (36.6 C) 97.9 F (36.6 C)  98.5 F (36.9 C)  TempSrc: Axillary Axillary  Oral  SpO2: 96% 97%  90%  Weight:      Height:       No intake or output data in the 24 hours ending 05/16/20 1201 Filed Weights   05/12/20 0500 05/13/20 0500 05/14/20 0500  Weight: 79.6 kg 80.7 kg 81.8 kg    Exam: Constitutional: Elderly chronically ill-appearing female Eyes: PERRL, scleral icterus appreciated ENMT: Mucous membranes are dry. Posterior pharynx clear of any exudate or lesions.   Neck: normal, supple, no masses, no thyromegaly Respiratory: clear to auscultation bilaterally, no wheezing, no crackles. Normal respiratory effort. No accessory muscle use.  Cardiovascular: Regular rate and rhythm, no murmurs / rubs / gallops. No extremity edema. 2+ pedal pulses. No carotid bruits.  Abdomen: no tenderness, no significant fluid wave appreciated.  Bowel sounds present.  Musculoskeletal: no clubbing / cyanosis. No joint deformity upper and lower extremities. Good ROM, no contractures. Normal muscle tone.  Skin: Jaundice Neurologic: CN 2-12 grossly intact. Sensation intact, DTR normal. Strength 5/5 in all 4.  Psychiatric: Lethargic, but at least oriented to self.   Data Reviewed:   I have personally reviewed following labs and imaging studies:  Labs: Labs show the following:   Basic Metabolic Panel: Recent Labs  Lab 05/09/20 1307 05/09/20 2039 05/12/20 0401 05/13/20 0032 05/14/20 0051 05/15/20 0043 05/16/20 0230  NA 139   < > 135 134* 134* 135 139  K 4.3   < > 4.1 4.1 3.9 3.4* 4.8  CL 100   < > 100 100 99 101 105  CO2 23   < > 27 23 25 24 26   GLUCOSE 111*   < > 91 87 99 181* 106*  BUN 36*   < > 37* 41* 38* 27* 18  CREATININE 2.97*   < > 1.83* 1.91* 1.51* 1.08* 0.98  CALCIUM 9.1   < > 8.9 9.0 9.0 8.8* 9.0  MG 1.9  --   --   --   --   --   --    < > = values in this interval not displayed.   GFR Estimated Creatinine Clearance: 52.2 mL/min (by C-G  formula based on SCr of 0.98 mg/dL). Liver Function Tests: Recent Labs  Lab 05/09/20 1307 05/09/20 2039 05/12/20 0401  AST 52* 54* 45*  ALT 24 24 21   ALKPHOS 136* 136* 90  BILITOT 7.3* 9.3* 6.1*  PROT 6.7 7.1 6.0*  ALBUMIN 2.6* 2.7* 2.9*   No results for input(s): LIPASE, AMYLASE in the last 168 hours. No results for input(s): AMMONIA in the last 168 hours. Coagulation profile Recent Labs  Lab 05/11/20 0219 05/12/20 0401 05/13/20 0032 05/14/20 0051 05/15/20 0043  INR 2.2* 2.2* 2.2* 1.9* 2.0*    CBC: Recent Labs  Lab 05/09/20 1307 05/09/20 2039 05/12/20 0401 05/13/20 0032 05/14/20 0343 05/15/20 0043 05/16/20 0230  WBC 5.5   < > 4.0 4.8 6.0 4.4 4.5  NEUTROABS 3.9  --   --   --   --   --   --   HGB 11.7*   < > 9.0* 9.8* 9.9* 10.1* 9.9*  HCT 37.0   < > 27.2* 30.8* 29.2* 29.7* 30.1*  MCV 96.6   < > 94.4 96.3 92.7 92.5 95.0  PLT 67*   < > 43* 44* 54* 44* 44*   < > = values in this interval not displayed.   Cardiac Enzymes: No results for input(s): CKTOTAL, CKMB, CKMBINDEX, TROPONINI in the last 168 hours. BNP (last 3 results) No results for input(s): PROBNP in the last 8760 hours. CBG: No results for input(s): GLUCAP in the last 168 hours. D-Dimer: No results for input(s): DDIMER in the last 72 hours. Hgb A1c: No results for input(s): HGBA1C in the last 72 hours. Lipid Profile: No results for input(s): CHOL, HDL, LDLCALC, TRIG, CHOLHDL, LDLDIRECT in the last 72 hours. Thyroid function studies: No results for input(s): TSH, T4TOTAL, T3FREE, THYROIDAB in the last 72 hours.  Invalid input(s): FREET3 Anemia work up: No results for input(s): VITAMINB12, FOLATE, FERRITIN, TIBC, IRON, RETICCTPCT in the last 72 hours. Sepsis Labs: Recent Labs  Lab 05/09/20 1506 05/09/20 2039 05/10/20 0100 05/10/20 0607 05/10/20 1642 05/11/20 0219 05/12/20 0401 05/13/20 0032 05/14/20 0343 05/15/20 0043 05/16/20 0230  PROCALCITON  --   --   --   --  0.10 <0.10 <0.10  --    --   --   --   WBC  --  9.4  --   --   --  5.2 4.0 4.8 6.0 4.4 4.5  LATICACIDVEN 6.9* 3.8* 2.3* 2.4*  --   --   --   --   --   --   --     Microbiology Recent Results (from the past 240 hour(s))  Resp Panel by RT-PCR (Flu A&B, Covid) Nasopharyngeal Swab     Status: None   Collection Time: 05/09/20  1:05 PM   Specimen: Nasopharyngeal Swab; Nasopharyngeal(NP) swabs in vial transport medium  Result Value Ref Range Status   SARS Coronavirus 2 by RT PCR NEGATIVE NEGATIVE Final    Comment: (NOTE) SARS-CoV-2 target nucleic acids are NOT DETECTED.  The SARS-CoV-2 RNA is generally detectable in upper respiratory specimens during the acute phase of infection. The lowest concentration of SARS-CoV-2 viral copies this assay can detect is 138 copies/mL. A negative result does not preclude SARS-Cov-2 infection and should not be used as the sole basis for treatment or other patient management decisions. A negative result may occur with  improper specimen collection/handling, submission of specimen other than nasopharyngeal swab, presence of viral mutation(s) within the areas targeted by this assay, and inadequate number of viral copies(<138 copies/mL). A negative result must be combined with clinical observations, patient history, and epidemiological information. The expected result is Negative.  Fact Sheet for Patients:  EntrepreneurPulse.com.au  Fact Sheet for Healthcare Providers:  IncredibleEmployment.be  This test is no t yet approved or cleared by the [place] FDA and  has been authorized for detection and/or diagnosis of SARS-CoV-2 by FDA under an Emergency Use Authorization (EUA). This EUA will remain  in effect (meaning this test can be used) for the duration of the COVID-19 declaration under Section 564(b)(1) of the Act, 21 U.S.C.section 360bbb-3(b)(1), unless the authorization is terminated  or revoked sooner.       Influenza A by PCR  NEGATIVE NEGATIVE Final   Influenza B by PCR NEGATIVE NEGATIVE Final    Comment: (NOTE) The Xpert Xpress SARS-CoV-2/FLU/RSV plus assay is intended as an aid in the diagnosis of influenza from Nasopharyngeal swab specimens and should not be used as a sole basis for treatment. Nasal washings and aspirates are unacceptable for Xpert Xpress SARS-CoV-2/FLU/RSV  testing.  Fact Sheet for Patients: EntrepreneurPulse.com.au  Fact Sheet for Healthcare Providers: IncredibleEmployment.be  This test is not yet approved or cleared by the [place] FDA and has been authorized for detection and/or diagnosis of SARS-CoV-2 by FDA under an Emergency Use Authorization (EUA). This EUA will remain in effect (meaning this test can be used) for the duration of the COVID-19 declaration under Section 564(b)(1) of the Act, 21 U.S.C. section 360bbb-3(b)(1), unless the authorization is terminated or revoked.  Performed at [facility], [address], [place], [place] [address]   MRSA PCR Screening     Status: None   Collection Time: 05/09/20  2:31 PM   Specimen: Nasopharyngeal  Result Value Ref Range Status   MRSA by PCR NEGATIVE NEGATIVE Final    Comment:        The GeneXpert MRSA Assay (FDA approved for NASAL specimens only), is one component of a comprehensive MRSA colonization surveillance program. It is not intended to diagnose MRSA infection nor to guide or monitor treatment for MRSA infections. Performed at [facility], [place] [address], [place], [place] [address]   Surgical PCR screen     Status: None   Collection Time: 05/10/20 10:14 AM   Specimen: Nasal Mucosa; Nasal Swab  Result Value Ref Range Status   MRSA, PCR NEGATIVE NEGATIVE Final   Staphylococcus aureus NEGATIVE NEGATIVE Final    Comment: (NOTE) The Xpert SA Assay (FDA approved for NASAL specimens in patients 25 years of age and older), is one component of a comprehensive surveillance  program. It is not intended to diagnose infection nor to guide or monitor treatment. Performed at [facility], [place] [address] [place], [place] [address]   SARS CORONAVIRUS 2 (TAT 6-24 HRS) Nasopharyngeal Nasopharyngeal Swab     Status: None   Collection Time: 05/15/20  1:48 PM   Specimen: Nasopharyngeal Swab  Result Value Ref Range Status   SARS Coronavirus 2 NEGATIVE NEGATIVE Final    Comment: (NOTE) SARS-CoV-2 target nucleic acids are NOT DETECTED.  The SARS-CoV-2 RNA is generally detectable in upper and lower respiratory specimens during the acute phase of infection. Negative results do not preclude SARS-CoV-2 infection, do not rule out co-infections with other pathogens, and should not be used as the sole basis for treatment or other patient management decisions. Negative results must be combined with clinical observations, patient history, and epidemiological information. The expected result is Negative.  Fact Sheet for Patients: SugarRoll.be  Fact Sheet for Healthcare Providers: https://www.woods-mathews.com/  This test is not yet approved or cleared by the [place] FDA and  has been authorized for detection and/or diagnosis of SARS-CoV-2 by FDA under an Emergency Use Authorization (EUA). This EUA will remain  in effect (meaning this test can be used) for the duration of the COVID-19 declaration under Se ction 564(b)(1) of the Act, 21 U.S.C. section 360bbb-3(b)(1), unless the authorization is terminated or revoked sooner.  Performed at [facility], [place] [address], [place], [place] [address]     Procedures and diagnostic studies:  No results found.  Medications:   . Chlorhexidine Gluconate Cloth  6 each Topical Daily  . DULoxetine  20 mg Oral QHS  . ezetimibe  10 mg Oral QHS  . guaiFENesin  600 mg Oral BID  . lidocaine  1 patch Transdermal Q24H  . methocarbamol  500 mg Oral TID  . metoprolol  succinate  25 mg Oral Daily  . oxyCODONE  20 mg Oral Q12H  .  pantoprazole  40 mg Oral Daily  . senna-docusate  2 tablet Oral QHS  . sodium chloride flush  10-40 mL Intracatheter Q12H  . sodium chloride flush  3 mL Intravenous Q12H  . sodium chloride flush  3 mL Intravenous Q12H   Continuous Infusions: . sodium chloride 10 mL/hr at 05/15/20 0200  . sodium chloride 999 mL/hr at 05/11/20 1646     LOS: 7 days   Rondell A Smith  [facility]   *Please refer to [email or web address], password [email or web address] to get updated schedule on who will round on this patient, as hospitalists switch teams weekly. If 7PM-7AM, please contact night-coverage at [email or web address],

## 2020-05-16 NOTE — Consult Note (Signed)
Referring Provider: Norval Morton, MD Primary Care Physician:  [name], MD  Primary Gastoenterologist: Rogene Houston, MD  Reason for Consultation:  Hyperbilirubinemia   IMPRESSION:  Altered mental status with lethargy and hallucinations    - elevated ammonia level    - may be multifactorial including grade II hepatic encephalopathy, pneumonia Congestive hepatopathy + underlying NASH cirrhosis    - MELD 23, Child's C    - Chronic hyperbilirubinemia and abnormal AST>ALT, normal alk phos    - likely NASH with concurrent congestive hepatopathy    - bilirubin peaked 05/10/19, currently 9.1    - direct bilirubin is only 2.8, majority is indirect bilirubin Scant ascites on recent imaging Known esophageal and gastric varices    - not on non-selective beta-blocker, ? Due to lung disease Hypersplenism/thrombocytopenia Normocytic anemia without recent bleeding  PLAN: - Increase lactulose dosing with goal of at least 3 BM daily - Start Xifaxan 550 mg BID - Minimize narcotics, benzos - Consider panculture to exclude concurrent infection - Maximize cardiac output   HPI: Samantha [name] is a 70 y.o. female seen in consultation at the request of Dr. Tamala Julian for further evaluation of cirrhosis and hyperbilirubinemia. No family was present at the time of my evaluation.    She has been hospitalized since 05/09/20. Anticipated discharge today or tomorrow. We were called this afternoon and asked to evaluate her urgently prior to discharge to a skilled nursing facility.   She is followed by Dr. Laural Golden for NASH cirrhosis with associated portal hypertension. She has known esophageal and gastric varices.  She has multiple concurrent medical problems including CAD status post CABG x3, chronic systolic CHF with LVEF of 35 to 40% in January 2021, PAF, hypertension, L1-L2 compression fracture in February 2022, chronic hypoxic respiratory failure on home oxygen as needed, and a lung nodule on CT  2021. She presented to the outside hospital with multiple falls, unresponsiveness and symptomatic bradycardia. [facility] to [facility]. She arrived in complete heart block complicated by cardiogenic shock.She required a dopamine drip, temporary pacer placement, and ultimately underwent transvenous pacemaker implantation 05/12/19. AKI on arrive has improved as cardiac output improved. She has not been treated with diuretics or anticoagulation due to a history of GI bleeds.Hospitalization complicated pneumonia treated with 5 days of Unasyn and with lethargy, confusion, and delusions.  Palliative care is involved and the patient has been made DNR.   No documented history of hepatic encephalopathy. She had a small amount of ascites on ultrasound 01/27/20.   Past Medical History:  Diagnosis Date  . Cataract    OU  . Chronic combined systolic and diastolic CHF (congestive heart failure) ([place])   . Cirrhosis of liver ([place])   . CKD (chronic kidney disease), stage III ([place])   . Coronary artery disease    a. s/p CABG 2001 w/ (LIMA-OM, SVG-D1, SVG-RCA). b. h/o multiple PCIs per Dr. Antionette Char note.  . Depression   . Esophageal varices ([place])    New 2013  . Gastroesophageal reflux disease   . History of pneumonia   . Hyperlipidemia   . Hypertension   . Hypertensive retinopathy    OU  . Obesity   . Osteoarthritis   . Pancytopenia ([place])   . Paroxysmal atrial flutter ([place])    a. dx 05/2016.  [name] Persistent atrial fibrillation ([place])    a. reported in hosp 07/2016, not on anticoag due to cirrhosis and liver disease, low  platelets, varices.  . Thrombocytopenia ([place])     Past Surgical History:  Procedure Laterality Date  . ABDOMINAL HYSTERECTOMY    . BACK SURGERY    . CARDIAC CATHETERIZATION  2004   left internal mammary artery to the obtuse marginal  was found to be small and thread like.  The two grafts were patent.  The left circumflex had 90% in-stent  restenosis and cutting balloon angioplasty was performed followed by placement of a 3.0 x 49m Taxus drug -eluting stent.    .Marland KitchenCARDIAC CATHETERIZATION  2006   There was in-stent restenosis in the left circumflex and this was treated with cutting balloon angioplasty   . CARDIAC CATHETERIZATION  2008   vein graft to the to the obtuse marginal was patent, although small, left circumflex had 40% in-stent restenosis, ejection fraction 40-45%.  The patient was medically mananged.  .Marland KitchenCARDIAC CATHETERIZATION N/A 01/09/2015   Procedure: Left Heart Cath and Cors/Grafts Angiography;  Surgeon: HBelva Crome MD;  [facility];  Service: Cardiovascular;  Laterality: N/A;  . CATARACT EXTRACTION W/PHACO Right 05/17/2019   Procedure: CATARACT EXTRACTION PHACO AND INTRAOCULAR LENS PLACEMENT (IOC) (CDE: 14.99);  Surgeon: WBaruch Goldmann MD;  Location: [facility];  Service: Ophthalmology;  Laterality: Right;  . CATARACT EXTRACTION W/PHACO Left 06/10/2019   Procedure: CATARACT EXTRACTION PHACO AND INTRAOCULAR LENS PLACEMENT (IOC);  Surgeon: WBaruch Goldmann MD;  Location: [facility];  Service: Ophthalmology;  Laterality: Left;  CDE: 11.96  . cataract sx    . COLONOSCOPY  03/29/2011   Procedure: COLONOSCOPY;  Surgeon: MJamesetta So MD;  [facility];  Service: Gastroenterology;  Laterality: N/A;  . CORONARY ARTERY BYPASS GRAFT  May 31,2001   x 3 with a vein graft to the first diagonal, vein graft to the right coronary  artery, and a free left internal mammary  artery to the obtuse marginal   . ESOPHAGEAL BANDING N/A 07/04/2012   Procedure: ESOPHAGEAL BANDING;  Surgeon: NRogene Houston MD;  [facility];  Service: Endoscopy;  Laterality: N/A;  . ESOPHAGEAL BANDING N/A 09/17/2012   Procedure: ESOPHAGEAL BANDING;  Surgeon: NRogene Houston MD;  [facility];  Service: Endoscopy;  Laterality: N/A;  . ESOPHAGEAL BANDING N/A 10/22/2013   Procedure: ESOPHAGEAL BANDING;  Surgeon: NRogene Houston MD;  [facility];  Service: Endoscopy;  Laterality: N/A;  . ESOPHAGEAL BANDING N/A 11/28/2014   Procedure: ESOPHAGEAL BANDING;  Surgeon: NRogene Houston MD;  [facility];  Service: Endoscopy;  Laterality: N/A;  . ESOPHAGEAL BANDING N/A 10/29/2015   Procedure: ESOPHAGEAL BANDING;  Surgeon: NRogene Houston MD;  [facility];  Service: Endoscopy;  Laterality: N/A;  . ESOPHAGOGASTRODUODENOSCOPY  12/20/2011   Procedure: ESOPHAGOGASTRODUODENOSCOPY (EGD);  Surgeon: MJamesetta So MD;  [facility];  Service: Gastroenterology;  Laterality: N/A;  . ESOPHAGOGASTRODUODENOSCOPY N/A 07/04/2012   Procedure: ESOPHAGOGASTRODUODENOSCOPY (EGD);  Surgeon: NRogene Houston MD;  [facility];  Service: Endoscopy;  Laterality: N/A;  235-moved to 255 Ann to notify pt  . ESOPHAGOGASTRODUODENOSCOPY N/A 09/17/2012   Procedure: ESOPHAGOGASTRODUODENOSCOPY (EGD);  Surgeon: NRogene Houston MD;  [facility];  Service: Endoscopy;  Laterality: N/A;  730  . ESOPHAGOGASTRODUODENOSCOPY N/A 10/22/2013   Procedure: ESOPHAGOGASTRODUODENOSCOPY (EGD);  Surgeon: NRogene Houston MD;  [facility];  Service: Endoscopy;  Laterality: N/A;  730  . ESOPHAGOGASTRODUODENOSCOPY N/A 11/28/2014   Procedure: ESOPHAGOGASTRODUODENOSCOPY (EGD);  Surgeon:  Najeeb U Rehman, MD;  [facility];  Service: Endoscopy;  Laterality: N/A;  1:25  . ESOPHAGOGASTRODUODENOSCOPY N/A 10/29/2015   Procedure: ESOPHAGOGASTRODUODENOSCOPY (EGD);  Surgeon: Najeeb U Rehman, MD;  [facility];  Service: Endoscopy;  Laterality: N/A;  12:00  . ESOPHAGOGASTRODUODENOSCOPY N/A 12/12/2016   Grade 1 and 2 varices in lower third of esophagus, post variceal banding scar distal esophagus, mild portal hypertensive gastropathy, Type 1 gastroesophageal varices without bleeding, normal duodenum and second portion of duodenum.   . FLEXIBLE SIGMOIDOSCOPY N/A 03/20/2017   external  hemorrhoids  . JOINT REPLACEMENT    . LEFT HEART CATH AND CORS/GRAFTS ANGIOGRAPHY N/A 08/15/2016   Procedure: Left Heart Cath and Cors/Grafts Angiography;  Surgeon: Varanasi, Jayadeep S, MD;  [facility];  Service: Cardiovascular;  Laterality: N/A;  . LEFT HEART CATHETERIZATION WITH CORONARY/GRAFT ANGIOGRAM N/A 12/25/2013   Procedure: LEFT HEART CATHETERIZATION WITH CORONARY/GRAFT ANGIOGRAM;  Surgeon: Michael D Cooper, MD;  Location: [facility];  Service: Cardiovascular;  Laterality: N/A;  . PACEMAKER IMPLANT N/A 05/11/2020   Procedure: PACEMAKER IMPLANT;  Surgeon: Lambert, Cameron T, MD;  [facility];  Service: Cardiovascular;  Laterality: N/A;  . RIGHT HEART CATH N/A 05/29/2017   Procedure: RIGHT HEART CATH;  Surgeon: Bensimhon, Daniel R, MD;  [facility];  Service: Cardiovascular;  Laterality: N/A;  . rotator cuff left  2007  . TEMPORARY PACEMAKER N/A 05/09/2020   Procedure: TEMPORARY PACEMAKER;  Surgeon: Harding, David W, MD;  [facility];  Service: Cardiovascular;  Laterality: N/A;  . TONSILLECTOMY      Current Facility-Administered Medications  Medication Dose Route Frequency Provider Last Rate Last Admin  . 0.9 %  sodium chloride infusion  250 mL Intravenous PRN Harding, David W, MD 10 mL/hr at 05/15/20 0200 Infusion Verify at 05/15/20 0200  . 0.9 %  sodium chloride infusion  250 mL Intravenous PRN Harding, David W, MD 999 mL/hr at 05/11/20 1646 Rate Change at 05/11/20 1646  . albuterol (VENTOLIN HFA) 108 (90 Base) MCG/ACT inhaler 2 puff  2 puff Inhalation Q6H PRN Harding, David W, MD      . Chlorhexidine Gluconate Cloth 2 % PADS 6 each  6 each Topical Daily Harding, David W, MD   6 each at 05/15/20 0923  . DULoxetine (CYMBALTA) DR capsule 20 mg  20 mg Oral QHS Mahan, Kasie J, NP   20 mg at 05/15/20 2225  . ezetimibe (ZETIA) tablet 10 mg  10 mg Oral QHS Smith, Rondell A, MD      . guaiFENesin (MUCINEX) 12 hr tablet 600 mg   600 mg Oral BID Zhang, Ping T, MD   600 mg at 05/16/20 0904  . ipratropium (ATROVENT) 0.06 % nasal spray 1 spray  1 spray Each Nare Daily PRN Harding, David W, MD      . lactulose (CHRONULAC) 10 GM/15ML solution 10 g  10 g Oral BID Smith, Rondell A, MD      . lidocaine (LIDODERM) 5 % 1 patch  1 patch Transdermal Q24H Harding, David W, MD   1 patch at 05/15/20 1636  . methocarbamol (ROBAXIN) tablet 500 mg  500 mg Oral TID Harding, David W, MD   500 mg at 05/16/20 0904  . metoprolol succinate (TOPROL-XL) 24 hr tablet 25 mg  25 mg Oral Daily Chandrasekhar, Mahesh A, MD   25 mg at 05/16/20 0904  . nitroGLYCERIN (NITROSTAT) SL tablet 0.4 mg  0.4   mg Sublingual Q5 min PRN Chandrasekhar, Mahesh A, MD      . ondansetron (ZOFRAN) tablet 4 mg  4 mg Oral Q8H PRN Chandrasekhar, Mahesh A, MD   4 mg at 05/15/20 1937  . oxyCODONE (Oxy IR/ROXICODONE) immediate release tablet 5 mg  5 mg Oral Q4H PRN Mahan, Kasie J, NP      . oxyCODONE (OXYCONTIN) 12 hr tablet 20 mg  20 mg Oral Q12H Mahan, Kasie J, NP   20 mg at 05/16/20 0904  . pantoprazole (PROTONIX) EC tablet 40 mg  40 mg Oral Daily Harding, David W, MD   40 mg at 05/16/20 0904  . pneumococcal 23 valent vaccine (PNEUMOVAX-23) injection 0.5 mL  0.5 mL Intramuscular Prior to discharge Pemberton, Heather E, MD      . senna-docusate (Senokot-S) tablet 2 tablet  2 tablet Oral QHS Harding, David W, MD   2 tablet at 05/15/20 2113  . sodium chloride flush (NS) 0.9 % injection 10-40 mL  10-40 mL Intracatheter Q12H Harding, David W, MD   10 mL at 05/13/20 0905  . sodium chloride flush (NS) 0.9 % injection 10-40 mL  10-40 mL Intracatheter PRN Harding, David W, MD      . sodium chloride flush (NS) 0.9 % injection 3 mL  3 mL Intravenous Q12H Harding, David W, MD   3 mL at 05/13/20 2110  . sodium chloride flush (NS) 0.9 % injection 3 mL  3 mL Intravenous PRN Harding, David W, MD   3 mL at 05/11/20 0946  . sodium chloride flush (NS) 0.9 % injection 3 mL  3 mL Intravenous Q12H  Harding, David W, MD   3 mL at 05/15/20 2128  . sodium chloride flush (NS) 0.9 % injection 3 mL  3 mL Intravenous PRN Harding, David W, MD   3 mL at 05/15/20 0923    Allergies as of 05/09/2020 - Review Complete 05/09/2020  Allergen Reaction Noted  . Entresto [sacubitril-valsartan] Swelling 05/25/2017  . Vibra-tab [doxycycline] Nausea Only 02/25/2020  . Acetaminophen Other (See Comments) 01/08/2015  . Ace inhibitors Other (See Comments) 07/04/2012  . Cefaclor Itching and Rash 06/23/2008  . Cephalexin Itching and Rash 06/23/2008  . Penicillins Rash 06/23/2008  . Pregabalin Other (See Comments) 06/23/2011  . Tape Itching and Rash 06/19/2013    Family History  Problem Relation Age of Onset  . Heart attack Mother   . Diabetes Mother   . Heart attack Father 73       cause of death  . Diabetes Father   . Coronary artery disease Sister        CABG   . Diabetes Sister   . Glaucoma Sister   . Diabetes Brother   . Colon cancer Neg Hx      Physical Exam: Vital signs in last 24 hours: Temp:  [97.9 F (36.6 C)-98.8 F (37.1 C)] 98.8 F (37.1 C) (03/19 1703) Pulse Rate:  [76-90] 76 (03/19 1703) Resp:  [15-18] 18 (03/19 1703) BP: (90-128)/(43-51) 104/44 (03/19 1703) SpO2:  [90 %-100 %] 96 % (03/19 1703) Last BM Date:  (PTA) General:   Alert, in NAD. No scleral icterus. No bilateral temporal wasting. Some bilateral temporal wasting.  Heart:  Regular rate and rhythm; no murmurs Pulm: Clear anteriorly; no wheezing Abdomen:  Soft. Nontender. Nondistended. Normal bowel sounds. No rebound or guarding. No fluid wave.  LAD: No inguinal or umbilical LAD Extremities:  Without edema. Neurologic:  Alert and  oriented x person, year, month "2022"   president, "[place]";  grossly normal neurologically; + asterixis. Skin: No jaundice. Marked palmar erythema. Multiple spider angioma on the chest wall and face.  Psych:  Alert and cooperative. Normal mood and affect.   Lab Results: Recent Labs     05/14/20 0343 05/15/20 0043 05/16/20 0230  WBC 6.0 4.4 4.5  HGB 9.9* 10.1* 9.9*  HCT 29.2* 29.7* 30.1*  PLT 54* 44* 44*   BMET Recent Labs    05/14/20 0051 05/15/20 0043 05/16/20 0230  NA 134* 135 139  K 3.9 3.4* 4.8  CL 99 101 105  CO2 25 24 26  GLUCOSE 99 181* 106*  BUN 38* 27* 18  CREATININE 1.51* 1.08* 0.98  CALCIUM 9.0 8.8* 9.0   LFT Recent Labs    05/16/20 1135  PROT 5.7*  ALBUMIN 2.4*  AST 65*  ALT 25  ALKPHOS 90  BILITOT 9.1*  BILIDIR 2.8*  IBILI 6.3*   PT/INR Recent Labs    05/14/20 0051 05/15/20 0043  LABPROT 21.3* 21.9*  INR 1.9* 2.0*     Kimberly L. Beavers, MD, MPH 05/16/2020, 5:07 PM     

## 2020-05-17 DIAGNOSIS — I5043 Acute on chronic combined systolic (congestive) and diastolic (congestive) heart failure: Secondary | ICD-10-CM | POA: Diagnosis not present

## 2020-05-17 DIAGNOSIS — K7460 Unspecified cirrhosis of liver: Secondary | ICD-10-CM | POA: Diagnosis not present

## 2020-05-17 DIAGNOSIS — D696 Thrombocytopenia, unspecified: Secondary | ICD-10-CM

## 2020-05-17 DIAGNOSIS — N179 Acute kidney failure, unspecified: Secondary | ICD-10-CM | POA: Diagnosis not present

## 2020-05-17 DIAGNOSIS — J189 Pneumonia, unspecified organism: Secondary | ICD-10-CM

## 2020-05-17 DIAGNOSIS — R5381 Other malaise: Secondary | ICD-10-CM

## 2020-05-17 DIAGNOSIS — B370 Candidal stomatitis: Secondary | ICD-10-CM

## 2020-05-17 DIAGNOSIS — I442 Atrioventricular block, complete: Secondary | ICD-10-CM | POA: Diagnosis not present

## 2020-05-17 DIAGNOSIS — G9341 Metabolic encephalopathy: Secondary | ICD-10-CM

## 2020-05-17 LAB — CBC
HCT: 29.4 % — ABNORMAL LOW (ref 36.0–46.0)
Hemoglobin: 9.8 g/dL — ABNORMAL LOW (ref 12.0–15.0)
MCH: 31.6 pg (ref 26.0–34.0)
MCHC: 33.3 g/dL (ref 30.0–36.0)
MCV: 94.8 fL (ref 80.0–100.0)
Platelets: 56 10*3/uL — ABNORMAL LOW (ref 150–400)
RBC: 3.1 MIL/uL — ABNORMAL LOW (ref 3.87–5.11)
RDW: 19.9 % — ABNORMAL HIGH (ref 11.5–15.5)
WBC: 6.2 10*3/uL (ref 4.0–10.5)
nRBC: 0 % (ref 0.0–0.2)

## 2020-05-17 LAB — BASIC METABOLIC PANEL
Anion gap: 8 (ref 5–15)
BUN: 21 mg/dL (ref 8–23)
CO2: 26 mmol/L (ref 22–32)
Calcium: 8.9 mg/dL (ref 8.9–10.3)
Chloride: 102 mmol/L (ref 98–111)
Creatinine, Ser: 1.09 mg/dL — ABNORMAL HIGH (ref 0.44–1.00)
GFR, Estimated: 54 mL/min — ABNORMAL LOW (ref >60)
Glucose, Bld: 78 mg/dL (ref 70–99)
Potassium: 4.1 mmol/L (ref 3.5–5.1)
Sodium: 136 mmol/L (ref 135–145)

## 2020-05-17 LAB — GLUCOSE, CAPILLARY: Glucose-Capillary: 99 mg/dL (ref 70–99)

## 2020-05-17 MED ORDER — NYSTATIN 100000 UNIT/ML MT SUSP
5.0000 mL | Freq: Four times a day (QID) | OROMUCOSAL | Status: DC
  Administered 2020-05-17: 500000 [IU] via ORAL
  Filled 2020-05-17: qty 5

## 2020-05-17 MED ORDER — TORSEMIDE 20 MG PO TABS
20.0000 mg | ORAL_TABLET | Freq: Every day | ORAL | Status: DC
  Administered 2020-05-17 – 2020-05-19 (×3): 20 mg via ORAL
  Filled 2020-05-17 (×3): qty 1

## 2020-05-17 MED ORDER — MAGIC MOUTHWASH W/LIDOCAINE
10.0000 mL | Freq: Four times a day (QID) | ORAL | Status: DC
  Administered 2020-05-17 – 2020-05-19 (×4): 10 mL via ORAL
  Filled 2020-05-17 (×12): qty 10

## 2020-05-17 MED ORDER — SPIRONOLACTONE 12.5 MG HALF TABLET
12.5000 mg | ORAL_TABLET | Freq: Every day | ORAL | Status: DC
  Administered 2020-05-17 – 2020-05-18 (×2): 12.5 mg via ORAL
  Filled 2020-05-17 (×2): qty 1

## 2020-05-17 MED ORDER — NYSTATIN 100000 UNIT/ML MT SUSP
5.0000 mL | Freq: Two times a day (BID) | OROMUCOSAL | Status: DC
  Administered 2020-05-17 – 2020-05-19 (×4): 500000 [IU] via ORAL
  Filled 2020-05-17 (×4): qty 5

## 2020-05-17 NOTE — Progress Notes (Signed)
PROGRESS NOTE    Samantha [name]  WUJ:[id number] DOB: [birth year] DOA: 05/09/2020 PCP: [name], MD   Chief Complaint  Patient presents with  . Bradycardia    Brief Narrative:  Samantha [name] is an 70 y.o. female 70 year old female with history of CAD status post CABG x3, chronic systolic CHF with LVEF of 35 to 40% in January 2021, NASH with cirrhosis and portal hypertension, PAF, hypertension, L1-L2 compression fracture in February 2022, chronic hypoxic respiratory failure on home oxygen as needed presented with multiple falls, unresponsiveness and symptomatic bradycardia with heart rates in the 30s-40s. She was transferred from [facility] to [facility] under cardiology care. She was started on dopamine drip and underwent transvenous pacemaker. TRH service was consulted on 05/10/2020 for determination of CHF versus pneumonia. Reconsult on 05/16/2020 for lethargy and confusion.   Assessment & Plan:   Principal Problem:   Heart block AV complete (HCC) Active Problems:   Coronary artery disease involving native coronary artery of native heart with angina pectoris (HCC)   Essential hypertension   Cirrhosis, non-alcoholic (HCC)   Cardiomyopathy, ischemic-35-40% by cath    Persistent atrial fibrillation   Thrombocytopenia (HCC)   Pneumonia   Acute on chronic combined systolic and diastolic CHF (congestive heart failure) (HCC)   Paroxysmal ventricular tachycardia (HCC) - related to Bradycardia    Demand ischemia (HCC)   S/P placement of cardiac pacemaker   AKI (acute kidney injury) ([place])  1 acute metabolic encephalopathy with lethargy and recent hallucinations Likely multifactorial secondary to hepatic encephalopathy, pneumonia, CHF. Patient noted with asterixis on examination.  Patient with elevated bilirubin levels.  Ammonia noted to be elevated at 54. -Patient afebrile.  Normal white count.  No respiratory symptoms. -Somewhat lethargic however slight  improvement over the past 24 hours per family at bedside. -Patient seen in consultation by GI and lactulose dose adjusted to 20 g 3 times daily. -Patient also started on Xifaxan. -Minimize narcotics and benzos as able to. -Follow. -Per GI.  2.  Pneumonia/bilateral opacities -CT chest done with patchy areas of airspace disease suggestive of multifocal pneumonia viral or atypical cannot be ruled out. -Procalcitonin noted at < 0.1 for which pneumonia likely not bacterial. -CT chest also did show patulous debris-filled esophagus which could also entertain the possibility of a component of aspiration pneumonitis. -Continue empiric IV Unasyn to complete 5 to 7-day course. -Place on PPI.  3.  Oral thrush Patient with complaints of oral pain.  White exudate noted on tongue. -Place on Magic mouthwash with lidocaine and nystatin swish and swallow.  4.  Chronic hyperbilirubinemia/transaminitis -Per GI patient with elevated bilirubin for several months. -Likely congestive hepatopathy. -Continue diuresis. -Per GI.  5.  Congestive hepatopathy/underlying NASH cirrhosis/portal hypertension with ascites, varices/thrombocytopenia/coagulopathy -Patient with a meld score of 23, child C. -Per GI.  6.  Acute kidney injury on chronic kidney disease stage IIIa -On admission patient was noted to have a creatinine of 2.9 with concern for cardiorenal syndrome. -Renal function improved currently resolved. -Patient started back on diuretics of Demadex and Aldactone per primary team. -Follow.  7.  Debility PT/OT.  Patient for SNF.  8.  Chronic thrombocytopenia  9.  Chronic systolic heart failure/coronary artery disease status post CABG 2001 with subsequent PCI -2D echo EF 35 to 40% with mild RV dysfunction. -Continue Toprol-XL, Aldactone, Demadex. -Per primary team.  10.  Paroxysmal atrial fibrillation CHA2DS2-VASc score 6 -Continue Toprol-XL for rate control.  Not on anticoagulation due to increased  risk of bleeding in the setting of cirrhosis, esophageal varices and thrombocytopenia. -Per primary team.  11.  Complete heart block/status post PPM -Per primary team.  9.  Goals of care Palliative care consulted and following.  Patient currently a DNR with plans to transfer to SNF with palliative care following and if patient continues to decline and subsequently home with hospice.   DVT prophylaxis: SCDs Code Status: DNR Family Communication: Updated patient, husband, daughter-in-law at bedside. Disposition:   Status is: Inpatient    Dispo: The patient is from: Home              Anticipated d/c is to: SNF with palliative care following              Patient currently being managed per primary team.   Difficult to place patient no       Consultants:   [facility] hospitalist 05/10/2020  Palliative care: Dr. Rowe Pavy 05/13/2020  Gastroenterology: Dr. Tarri Glenn 05/16/2020  Procedures:   CT chest 05/10/2020  Chest x-ray 05/10/2020, 05/11/2020, 05/12/2020  2D echo 05/09/2020  Right IJ temporary pacemaker insertion per Dr. Ellyn Hack 05/09/2020  Dual-chamber PPM implantation per Dr. Lysbeth Galas 05/11/2020  Antimicrobials:   IV Unasyn 05/13/2020>>>>>   Subjective: Breakfast sitting on table, hardly touched.  Denies any chest pain.  No shortness of breath.  Somewhat somnolent.  Complaining of mouth pain to family at bedside stating that is the reason she is not eating as well.  Per family a little more alert today.  Complaining of pain all over  Objective: Vitals:   05/16/20 1703 05/16/20 2010 05/17/20 0454 05/17/20 0929  BP: (!) 104/44 (!) 111/40 (!) 118/48 (!) 121/51  Pulse: 76 75 74 77  Resp: 18 18 18    Temp: 98.8 F (37.1 C) 98.4 F (36.9 C) 99 F (37.2 C)   TempSrc: Oral Oral Oral   SpO2: 96% 95% 96%   Weight:   79.1 kg   Height:        Intake/Output Summary (Last 24 hours) at 05/17/2020 1236 Last data filed at 05/17/2020 0900 Gross per 24 hour  Intake 760 ml  Output --   Net 760 ml   Filed Weights   05/13/20 0500 05/14/20 0500 05/17/20 0454  Weight: 80.7 kg 81.8 kg 79.1 kg    Examination:  General exam: Somnolent. Oropharynx: Whitish exudate noted on tongue. Respiratory system: Clear to auscultation. Respiratory effort normal. Cardiovascular system: S1 & S2 heard, RRR.  1-2+ bilateral lower extremity edema.  Gastrointestinal system: Abdomen is nondistended, soft and nontender. No organomegaly or masses felt. Normal bowel sounds heard. Central nervous system: Alert and oriented. No focal neurological deficits.  Asterixis. Extremities: Symmetric 5 x 5 power.  Asterixis. Skin: No rashes, lesions or ulcers Psychiatry: Judgement and insight appear fair. Mood & affect appropriate.     Data Reviewed: I have personally reviewed following labs and imaging studies  CBC: Recent Labs  Lab 05/13/20 0032 05/14/20 0343 05/15/20 0043 05/16/20 0230 05/17/20 0204  WBC 4.8 6.0 4.4 4.5 6.2  HGB 9.8* 9.9* 10.1* 9.9* 9.8*  HCT 30.8* 29.2* 29.7* 30.1* 29.4*  MCV 96.3 92.7 92.5 95.0 94.8  PLT 44* 54* 44* 44* 56*    Basic Metabolic Panel: Recent Labs  Lab 05/13/20 0032 05/14/20 0051 05/15/20 0043 05/16/20 0230 05/17/20 0204  NA 134* 134* 135 139 136  K 4.1 3.9 3.4* 4.8 4.1  CL 100 99 101 105 102  CO2 23 25 24 26 26   GLUCOSE 87 99 181*  106* 78  BUN 41* 38* 27* 18 21  CREATININE 1.91* 1.51* 1.08* 0.98 1.09*  CALCIUM 9.0 9.0 8.8* 9.0 8.9    GFR: Estimated Creatinine Clearance: 46.1 mL/min (A) (by C-G formula based on SCr of 1.09 mg/dL (H)).  Liver Function Tests: Recent Labs  Lab 05/12/20 0401 05/16/20 1135  AST 45* 65*  ALT 21 25  ALKPHOS 90 90  BILITOT 6.1* 9.1*  PROT 6.0* 5.7*  ALBUMIN 2.9* 2.4*    CBG: No results for input(s): GLUCAP in the last 168 hours.   Recent Results (from the past 240 hour(s))  Resp Panel by RT-PCR (Flu A&B, Covid) Nasopharyngeal Swab     Status: None   Collection Time: 05/09/20  1:05 PM   Specimen:  Nasopharyngeal Swab; Nasopharyngeal(NP) swabs in vial transport medium  Result Value Ref Range Status   SARS Coronavirus 2 by RT PCR NEGATIVE NEGATIVE Final    Comment: (NOTE) SARS-CoV-2 target nucleic acids are NOT DETECTED.  The SARS-CoV-2 RNA is generally detectable in upper respiratory specimens during the acute phase of infection. The lowest concentration of SARS-CoV-2 viral copies this assay can detect is 138 copies/mL. A negative result does not preclude SARS-Cov-2 infection and should not be used as the sole basis for treatment or other patient management decisions. A negative result may occur with  improper specimen collection/handling, submission of specimen other than nasopharyngeal swab, presence of viral mutation(s) within the areas targeted by this assay, and inadequate number of viral copies(<138 copies/mL). A negative result must be combined with clinical observations, patient history, and epidemiological information. The expected result is Negative.  Fact Sheet for Patients:  EntrepreneurPulse.com.au  Fact Sheet for Healthcare Providers:  IncredibleEmployment.be  This test is no t yet approved or cleared by the [place] FDA and  has been authorized for detection and/or diagnosis of SARS-CoV-2 by FDA under an Emergency Use Authorization (EUA). This EUA will remain  in effect (meaning this test can be used) for the duration of the COVID-19 declaration under Section 564(b)(1) of the Act, 21 U.S.C.section 360bbb-3(b)(1), unless the authorization is terminated  or revoked sooner.       Influenza A by PCR NEGATIVE NEGATIVE Final   Influenza B by PCR NEGATIVE NEGATIVE Final    Comment: (NOTE) The Xpert Xpress SARS-CoV-2/FLU/RSV plus assay is intended as an aid in the diagnosis of influenza from Nasopharyngeal swab specimens and should not be used as a sole basis for treatment. Nasal washings and aspirates are unacceptable for  Xpert Xpress SARS-CoV-2/FLU/RSV testing.  Fact Sheet for Patients: EntrepreneurPulse.com.au  Fact Sheet for Healthcare Providers: IncredibleEmployment.be  This test is not yet approved or cleared by the [place] FDA and has been authorized for detection and/or diagnosis of SARS-CoV-2 by FDA under an Emergency Use Authorization (EUA). This EUA will remain in effect (meaning this test can be used) for the duration of the COVID-19 declaration under Section 564(b)(1) of the Act, 21 U.S.C. section 360bbb-3(b)(1), unless the authorization is terminated or revoked.  Performed at [facility], [address], [place], [place] [address]   MRSA PCR Screening     Status: None   Collection Time: 05/09/20  2:31 PM   Specimen: Nasopharyngeal  Result Value Ref Range Status   MRSA by PCR NEGATIVE NEGATIVE Final    Comment:        The GeneXpert MRSA Assay (FDA approved for NASAL specimens only), is one component of a comprehensive MRSA colonization surveillance program. It is not intended to diagnose  MRSA infection nor to guide or monitor treatment for MRSA infections. Performed at [facility], [place] [address], [place], [place] [address]   Surgical PCR screen     Status: None   Collection Time: 05/10/20 10:14 AM   Specimen: Nasal Mucosa; Nasal Swab  Result Value Ref Range Status   MRSA, PCR NEGATIVE NEGATIVE Final   Staphylococcus aureus NEGATIVE NEGATIVE Final    Comment: (NOTE) The Xpert SA Assay (FDA approved for NASAL specimens in patients 27 years of age and older), is one component of a comprehensive surveillance program. It is not intended to diagnose infection nor to guide or monitor treatment. Performed at [facility], [place], [place], [place] [address]   SARS CORONAVIRUS 2 (TAT 6-24 HRS) Nasopharyngeal Nasopharyngeal Swab     Status: None   Collection Time: 05/15/20  1:48 PM   Specimen: Nasopharyngeal Swab   Result Value Ref Range Status   SARS Coronavirus 2 NEGATIVE NEGATIVE Final    Comment: (NOTE) SARS-CoV-2 target nucleic acids are NOT DETECTED.  The SARS-CoV-2 RNA is generally detectable in upper and lower respiratory specimens during the acute phase of infection. Negative results do not preclude SARS-CoV-2 infection, do not rule out co-infections with other pathogens, and should not be used as the sole basis for treatment or other patient management decisions. Negative results must be combined with clinical observations, patient history, and epidemiological information. The expected result is Negative.  Fact Sheet for Patients: SugarRoll.be  Fact Sheet for Healthcare Providers: https://www.woods-mathews.com/  This test is not yet approved or cleared by the [place] FDA and  has been authorized for detection and/or diagnosis of SARS-CoV-2 by FDA under an Emergency Use Authorization (EUA). This EUA will remain  in effect (meaning this test can be used) for the duration of the COVID-19 declaration under Se ction 564(b)(1) of the Act, 21 U.S.C. section 360bbb-3(b)(1), unless the authorization is terminated or revoked sooner.  Performed at [facility], [place] [address] [place], [place] [address]          Radiology Studies: No results found.      Scheduled Meds: . Chlorhexidine Gluconate Cloth  6 each Topical Daily  . DULoxetine  20 mg Oral QHS  . ezetimibe  10 mg Oral QHS  . guaiFENesin  600 mg Oral BID  . lactulose  20 g Oral TID  . lidocaine  1 patch Transdermal Q24H  . methocarbamol  500 mg Oral TID  . metoprolol succinate  25 mg Oral Daily  . oxyCODONE  20 mg Oral Q12H  . pantoprazole  40 mg Oral Daily  . rifaximin  550 mg Oral BID  . senna-docusate  2 tablet Oral QHS  . spironolactone  12.5 mg Oral Daily  . torsemide  20 mg Oral Daily   Continuous Infusions:   LOS: 8 days    Time spent: 40  minutes    Irine Seal, MD [facility]   To contact the attending provider between 7A-7P or the covering provider during after hours 7P-7A, please log into the web site [email or web address] and access using universal [place] password for that web site. If you do not have the password, please call the hospital operator.  05/17/2020, 12:36 PM

## 2020-05-17 NOTE — TOC Progression Note (Signed)
Transition of Care [facility]) - Progression Note    Patient Details  Name: Samantha [name] MRN: [id number] Date of Birth: 1950/06/17  Transition of Care [facility]) CM/SW [place], [place] Phone Number: [phone] 05/17/2020, 11:25 AM  Clinical Narrative:     CSw called Tammie from admissions at facility and had leave message. CSW called main facility number and try to reach the receptionist however phone just kept ringing.  TOC team will continue to assist with discharge planning needs.  Expected Discharge Plan: [place] Barriers to Discharge: Continued Medical Work up  Expected Discharge Plan and Services Expected Discharge Plan: [place] In-house Referral: Clinical Social Work     Living arrangements for the past 2 months: Single Family Home                                       Social Determinants of Health (SDOH) Interventions    Readmission Risk Interventions Readmission Risk Prevention Plan 05/13/2020 02/19/2020  PCP or Specialist Appt within 3-5 Days - Complete  SW Recovery Care/Counseling Consult Complete -  [place] Complete -  Some recent data might be hidden

## 2020-05-17 NOTE — Progress Notes (Signed)
Progress Note  Patient Name: Samantha [name] Date of Encounter: 05/17/2020  Primary Cardiologist: Sherren Mocha, MD  Subjective   Somewhat somnolent today.  No chest pain or breathlessness.  No abdominal pain.  Did not finish her breakfast.  Inpatient Medications    Scheduled Meds: . Chlorhexidine Gluconate Cloth  6 each Topical Daily  . DULoxetine  20 mg Oral QHS  . ezetimibe  10 mg Oral QHS  . guaiFENesin  600 mg Oral BID  . lactulose  20 g Oral TID  . lidocaine  1 patch Transdermal Q24H  . methocarbamol  500 mg Oral TID  . metoprolol succinate  25 mg Oral Daily  . oxyCODONE  20 mg Oral Q12H  . pantoprazole  40 mg Oral Daily  . rifaximin  550 mg Oral BID  . senna-docusate  2 tablet Oral QHS    PRN Meds: albuterol, ipratropium, nitroGLYCERIN, ondansetron, oxyCODONE, pneumococcal 23 valent vaccine   Vital Signs    Vitals:   05/16/20 1703 05/16/20 2010 05/17/20 0454 05/17/20 0929  BP: (!) 104/44 (!) 111/40 (!) 118/48 (!) 121/51  Pulse: 76 75 74 77  Resp: 18 18 18    Temp: 98.8 F (37.1 C) 98.4 F (36.9 C) 99 F (37.2 C)   TempSrc: Oral Oral Oral   SpO2: 96% 95% 96%   Weight:   79.1 kg   Height:        Intake/Output Summary (Last 24 hours) at 05/17/2020 1200 Last data filed at 05/17/2020 0900 Gross per 24 hour  Intake 760 ml  Output --  Net 760 ml   Filed Weights   05/13/20 0500 05/14/20 0500 05/17/20 0454  Weight: 80.7 kg 81.8 kg 79.1 kg    Telemetry    Sinus rhythm with rare ventricular paced beats.  Personally reviewed.  ECG    An ECG dated 05/14/2020 was personally reviewed today and demonstrated:  Sinus rhythm with left bundle branch block and PAC.  Physical Exam   GEN:  Chronically ill-appearing, jaundiced, no distress..   Neck: No JVD. Cardiac:  RRR, 3/6 systolic murmur, no gallop.  Respiratory: Nonlabored.  Decreased breath sounds. GI: Soft, nontender, bowel sounds present. MS: No edema; No deformity. Neuro:  Nonfocal. Psych: Alert and  oriented x 3.  Diffusely weak.  Labs    Chemistry Recent Labs  Lab 05/12/20 0401 05/13/20 0032 05/15/20 0043 05/16/20 0230 05/16/20 1135 05/17/20 0204  NA 135   < > 135 139  --  136  K 4.1   < > 3.4* 4.8  --  4.1  CL 100   < > 101 105  --  102  CO2 27   < > 24 26  --  26  GLUCOSE 91   < > 181* 106*  --  78  BUN 37*   < > 27* 18  --  21  CREATININE 1.83*   < > 1.08* 0.98  --  1.09*  CALCIUM 8.9   < > 8.8* 9.0  --  8.9  PROT 6.0*  --   --   --  5.7*  --   ALBUMIN 2.9*  --   --   --  2.4*  --   AST 45*  --   --   --  65*  --   ALT 21  --   --   --  25  --   ALKPHOS 90  --   --   --  90  --   BILITOT  6.1*  --   --   --  9.1*  --   GFRNONAA 29*   < > 55* >60  --  54*  ANIONGAP 8   < > 10 8  --  8   < > = values in this interval not displayed.     Hematology Recent Labs  Lab 05/15/20 0043 05/16/20 0230 05/17/20 0204  WBC 4.4 4.5 6.2  RBC 3.21* 3.17* 3.10*  HGB 10.1* 9.9* 9.8*  HCT 29.7* 30.1* 29.4*  MCV 92.5 95.0 94.8  MCH 31.5 31.2 31.6  MCHC 34.0 32.9 33.3  RDW 18.6* 19.4* 19.9*  PLT 44* 44* 56*    Cardiac Enzymes Recent Labs  Lab 05/09/20 1307 05/09/20 1506  TROPONINIHS 81* 86*    Radiology    No results found.  Cardiac Studies   Echocardiogram 05/09/2020: 1. Left ventricular ejection fraction, by estimation, is 35 to 40%. The  left ventricle has moderately decreased function. The left ventricle  demonstrates global hypokinesis and RWMA as noted in findings. There is  mild left ventricular hypertrophy. Left  ventricular diastolic parameters are indeterminate.  2. Respiratory related septal shift.  3. Right ventricular systolic function is mildly reduced. The right  ventricular size is normal. There is moderately elevated pulmonary artery  systolic pressure. The estimated right ventricular systolic pressure is  32.2 mmHg.  4. Left atrial size was moderately dilated.  5. Right atrial size was moderately dilated.  6. Diastolic and systolic  mitral valve regurgitation.. The mitral valve  is grossly normal. Mild to moderate mitral valve regurgitation.  7. Tricuspid valve regurgitation is moderate.  8. The aortic valve is grossly normal. There is mild calcification of the  aortic valve. Aortic valve regurgitation is not visualized. No aortic  stenosis is present.  9. The inferior vena cava is dilated in size with <50% respiratory  variability, suggesting right atrial pressure of 15 mmHg.   Patient Profile     70 y.o. female with history of multivesselCAD status post CABG in 2001 with subsequent PCIs,nonalcoholic cirrhosis with esophageal varices and thrombocytopenia,heart failure with reduced ejection fraction as low as 20 to 35%, HTN, HLD, Afib not on AC due to cirrhosis and varices, SSS, COPD on home O2 who presented to [facility] with an episode of unresponsiveness found to be profoundly bradycardic in the field initially responsive to atropine. On arrival to [facility], reportedly had episode of accelerated idioventricular rhythm-->10s pause in which CPR was almost initiated and then she developed a bradycardic rhythm consistent with CHB. Subsequently transferred to [facility] now s/p DC PPM.  Assessment & Plan    1.  Chronic systolic heart failure, LVEF 35 to 40% by most recent assessment, also mild RV dysfunction.  Currently on Toprol-XL, Aldactone and Demadex have been held during hospital stay due to acute renal insufficiency, creatinine back to baseline.  2.  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  Patient is now status post placement of dual-chamber pacemaker, [employer] MRI PM 2272 on March 14 by Dr. Quentin Ore.  3.  Paroxysmal atrial fibrillation.  CHA2DS2-VASc score is 6 however she is not anticoagulated with concerns about bleeding in the setting of cirrhosis with esophageal varices and thrombocytopenia.  4.  CAD status post CABG in 2001 with subsequent PCI, no active angina with plan to continue  medical therapy.  She is not on antiplatelet therapy in the setting of thrombocytopenia.  Currently on Zetia for lipid management and beta-blocker.  5.  Patient currently  DNR status with plan to transition to SNF with palliative care and potentially hospice.  6.  Nonalcoholic cirrhosis with esophageal varices.  Likely congestive hepatopathy and underlying [place].  Appreciate GI input and treatment recommendations.  Patient awaits discharge to the [facility] for palliative care and likely transition to hospice.  From a cardiac perspective would continue Zetia and Toprol-XL. We will start back on lower dose Aldactone and Demadex.  Add potassium supplement as needed.  Recheck BMET in a.m.  Signed, Rozann Lesches, MD  05/17/2020, 12:00 PM

## 2020-05-17 NOTE — Progress Notes (Signed)
     [facility] Progress Note  Referring Provider: Norval Morton, MD Primary Care Physician:  [name], MD  Primary Gastoenterologist: Rogene Houston, MD  CC:  Confusion, hyperbilirubinemia  Assessment / Plan: Altered mental status with lethargy and recent hallucinations: Likely multifactorial including grade II hepatic encephalopathy, pneumonia. I increased her lactulose yesterday with a goal of at least 3 BM daily and added Xifaxan. Would minimize narcotics and benzos as you are able. Consider panculture if she does not respond to aggressive medical therapy.  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  MELD 23, Child's C: At this point, goal is maximizing cardiac output. She is not on a non-selective beta-blocker - ? Underlying lung disease.   Chronic hyperbilirubinemia and abnormal AST>ALT, normal alk phos: Labs confirm that this is largely indirect hyperbilirubinemia.  She has had elevated bilirubin for months, and magnitude of elevation peaked last week.  Likely due to congestive hepatopathy. Avoid hepatoxic medications.  Normocytic anemia: Hemoglobin stable. No evidence for active or recent bleeding.     Subjective: Alert this morning. Denies complaints. No family present at the time of my evaluation.   Objective:  Vital signs in last 24 hours: Temp:  [97.9 F (36.6 C)-99 F (37.2 C)] 99 F (37.2 C) (03/20 0454) Pulse Rate:  [74-89] 74 (03/20 0454) Resp:  [16-18] 18 (03/20 0454) BP: (99-118)/(40-48) 118/48 (03/20 0454) SpO2:  [90 %-97 %] 96 % (03/20 0454) Weight:  [79.1 kg] 79.1 kg (03/20 0454) Last BM Date:  (PTA) General:   Alert, in NAD. No scleral icterus. No bilateral temporal wasting. Some bilateral temporal wasting. Scleral icterus present.  Heart:  Regular rate and rhythm; no murmurs Pulm: Clear anteriorly; no  wheezing Abdomen:  Soft. Nontender. Nondistended. Normal bowel sounds. No rebound or guarding. No fluid wave.  LAD: No inguinal or umbilical LAD Extremities:  Without edema. Neurologic:  Alert and  oriented x person, year, month "2022" president, "[place]", "A place with medical people";  grossly normal neurologically; + asterixis. Skin: No jaundice. Marked palmar erythema. Multiple spider angioma on the chest wall and face.  Psych:  Alert and cooperative. Normal mood and affect.  Lab Results: Recent Labs    05/15/20 0043 05/16/20 0230 05/17/20 0204  WBC 4.4 4.5 6.2  HGB 10.1* 9.9* 9.8*  HCT 29.7* 30.1* 29.4*  PLT 44* 44* 56*   BMET Recent Labs    05/15/20 0043 05/16/20 0230 05/17/20 0204  NA 135 139 136  K 3.4* 4.8 4.1  CL 101 105 102  CO2 24 26 26   GLUCOSE 181* 106* 78  BUN 27* 18 21  CREATININE 1.08* 0.98 1.09*  CALCIUM 8.8* 9.0 8.9   LFT Recent Labs    05/16/20 1135  PROT 5.7*  ALBUMIN 2.4*  AST 65*  ALT 25  ALKPHOS 90  BILITOT 9.1*  BILIDIR 2.8*  IBILI 6.3*   PT/INR Recent Labs    05/15/20 0043  LABPROT 21.9*  INR 2.0*    LOS: 8 days   [place]  05/17/2020, 7:14 AM

## 2020-05-18 ENCOUNTER — Ambulatory Visit: Payer: Medicare Other | Admitting: Cardiovascular Disease

## 2020-05-18 DIAGNOSIS — I214 Non-ST elevation (NSTEMI) myocardial infarction: Secondary | ICD-10-CM | POA: Diagnosis not present

## 2020-05-18 DIAGNOSIS — K7460 Unspecified cirrhosis of liver: Secondary | ICD-10-CM | POA: Diagnosis not present

## 2020-05-18 DIAGNOSIS — N179 Acute kidney failure, unspecified: Secondary | ICD-10-CM | POA: Diagnosis not present

## 2020-05-18 DIAGNOSIS — K7469 Other cirrhosis of liver: Secondary | ICD-10-CM | POA: Diagnosis not present

## 2020-05-18 DIAGNOSIS — Z515 Encounter for palliative care: Secondary | ICD-10-CM | POA: Diagnosis not present

## 2020-05-18 DIAGNOSIS — Z7189 Other specified counseling: Secondary | ICD-10-CM | POA: Diagnosis not present

## 2020-05-18 DIAGNOSIS — I442 Atrioventricular block, complete: Secondary | ICD-10-CM | POA: Diagnosis not present

## 2020-05-18 DIAGNOSIS — I5022 Chronic systolic (congestive) heart failure: Secondary | ICD-10-CM | POA: Diagnosis not present

## 2020-05-18 DIAGNOSIS — I1310 Hypertensive heart and chronic kidney disease without heart failure, with stage 1 through stage 4 chronic kidney disease, or unspecified chronic kidney disease: Secondary | ICD-10-CM

## 2020-05-18 DIAGNOSIS — I5043 Acute on chronic combined systolic (congestive) and diastolic (congestive) heart failure: Secondary | ICD-10-CM | POA: Diagnosis not present

## 2020-05-18 LAB — CBC WITH DIFFERENTIAL/PLATELET
Abs Immature Granulocytes: 0.05 10*3/uL (ref 0.00–0.07)
Basophils Absolute: 0.1 10*3/uL (ref 0.0–0.1)
Basophils Relative: 1 %
Eosinophils Absolute: 0.5 10*3/uL (ref 0.0–0.5)
Eosinophils Relative: 9 %
HCT: 35.2 % — ABNORMAL LOW (ref 36.0–46.0)
Hemoglobin: 11.8 g/dL — ABNORMAL LOW (ref 12.0–15.0)
Immature Granulocytes: 1 %
Lymphocytes Relative: 17 %
Lymphs Abs: 0.9 10*3/uL (ref 0.7–4.0)
MCH: 31.6 pg (ref 26.0–34.0)
MCHC: 33.5 g/dL (ref 30.0–36.0)
MCV: 94.4 fL (ref 80.0–100.0)
Monocytes Absolute: 0.7 10*3/uL (ref 0.1–1.0)
Monocytes Relative: 13 %
Neutro Abs: 3.2 10*3/uL (ref 1.7–7.7)
Neutrophils Relative %: 59 %
Platelets: 44 10*3/uL — ABNORMAL LOW (ref 150–400)
RBC: 3.73 MIL/uL — ABNORMAL LOW (ref 3.87–5.11)
RDW: 19.9 % — ABNORMAL HIGH (ref 11.5–15.5)
WBC: 5.4 10*3/uL (ref 4.0–10.5)
nRBC: 0 % (ref 0.0–0.2)

## 2020-05-18 LAB — AMMONIA: Ammonia: 54 umol/L — ABNORMAL HIGH (ref 9–35)

## 2020-05-18 LAB — BASIC METABOLIC PANEL
Anion gap: 8 (ref 5–15)
BUN: 23 mg/dL (ref 8–23)
CO2: 25 mmol/L (ref 22–32)
Calcium: 8.9 mg/dL (ref 8.9–10.3)
Chloride: 102 mmol/L (ref 98–111)
Creatinine, Ser: 1.28 mg/dL — ABNORMAL HIGH (ref 0.44–1.00)
GFR, Estimated: 45 mL/min — ABNORMAL LOW (ref >60)
Glucose, Bld: 83 mg/dL (ref 70–99)
Potassium: 3.7 mmol/L (ref 3.5–5.1)
Sodium: 135 mmol/L (ref 135–145)

## 2020-05-18 LAB — HEPATIC FUNCTION PANEL
ALT: 27 U/L (ref 0–44)
AST: 58 U/L — ABNORMAL HIGH (ref 15–41)
Albumin: 2.6 g/dL — ABNORMAL LOW (ref 3.5–5.0)
Alkaline Phosphatase: 99 U/L (ref 38–126)
Bilirubin, Direct: 2.8 mg/dL — ABNORMAL HIGH (ref 0.0–0.2)
Indirect Bilirubin: 5.9 mg/dL — ABNORMAL HIGH (ref 0.3–0.9)
Total Bilirubin: 8.7 mg/dL — ABNORMAL HIGH (ref 0.3–1.2)
Total Protein: 6 g/dL — ABNORMAL LOW (ref 6.5–8.1)

## 2020-05-18 LAB — GLUCOSE, CAPILLARY: Glucose-Capillary: 95 mg/dL (ref 70–99)

## 2020-05-18 LAB — MAGNESIUM: Magnesium: 1.6 mg/dL — ABNORMAL LOW (ref 1.7–2.4)

## 2020-05-18 MED ORDER — LORAZEPAM 2 MG/ML IJ SOLN: 2.0000 mg | INTRAMUSCULAR | Status: DC | PRN

## 2020-05-18 MED ORDER — LORAZEPAM 1 MG PO TABS: 2.0000 mg | ORAL_TABLET | ORAL | Status: DC | PRN

## 2020-05-18 MED ORDER — OXYCODONE HCL 5 MG/5ML PO SOLN
5.0000 mg | ORAL | Status: DC | PRN
Start: 2020-05-18 — End: 2020-05-20

## 2020-05-18 MED ORDER — LORAZEPAM 2 MG/ML PO CONC: 2.0000 mg | ORAL | Status: DC | PRN

## 2020-05-18 MED ORDER — MAGNESIUM SULFATE 4 GM/100ML IV SOLN
4.0000 g | Freq: Once | INTRAVENOUS | Status: AC
  Administered 2020-05-18: 4 g via INTRAVENOUS
  Filled 2020-05-18: qty 100

## 2020-05-18 NOTE — Progress Notes (Addendum)
PROGRESS NOTE    Samantha [name]  AQT:[id number] DOB: 20-Feb-1951 DOA: 05/09/2020 PCP: [name], MD   Chief Complaint  Patient presents with  . Bradycardia    Brief Narrative:  Samantha [name] is an 70 y.o. female 70 year old female with history of CAD status post CABG x3, chronic systolic CHF with LVEF of 35 to 40% in January 2021, NASH with cirrhosis and portal hypertension, PAF, hypertension, L1-L2 compression fracture in February 2022, chronic hypoxic respiratory failure on home oxygen as needed presented with multiple falls, unresponsiveness and symptomatic bradycardia with heart rates in the 30s-40s. She was transferred from [facility] under cardiology care. She was started on dopamine drip and underwent transvenous pacemaker. TRH service was consulted on 05/10/2020 for determination of CHF versus pneumonia. Reconsult on 05/16/2020 for lethargy and confusion.   Assessment & Plan:   Principal Problem:   Heart block AV complete (HCC) Active Problems:   Coronary artery disease involving native coronary artery of native heart with angina pectoris (HCC)   Essential hypertension   Cirrhosis, non-alcoholic (HCC)   Cardiomyopathy, ischemic-35-40% by cath    Persistent atrial fibrillation   Thrombocytopenia (HCC)   Pneumonia   Acute on chronic combined systolic and diastolic CHF (congestive heart failure) (HCC)   Paroxysmal ventricular tachycardia (HCC) - related to Bradycardia    Demand ischemia (HCC)   S/P placement of cardiac pacemaker   AKI (acute kidney injury) ([place])   Oral thrush   Acute metabolic encephalopathy   Hyperbilirubinemia   Debility  1 acute metabolic encephalopathy with lethargy and recent hallucinations Likely multifactorial secondary to hepatic encephalopathy, pneumonia, CHF. -Patient noted with asterixis on examination.   -Patient with elevated bilirubin levels.  Ammonia noted to be elevated at 54. -Patient afebrile.   Normal white count.  No respiratory symptoms. -Mental status improved as patient more alert today and following commands appropriately.   -Patient seen in consultation by GI and lactulose dose adjusted to 20 g 3 times daily. -Patient also started on Xifaxan. -Minimize narcotics and benzos as able to. -Follow. -Per GI.  2.  Pneumonia/bilateral opacities -CT chest done with patchy areas of airspace disease suggestive of multifocal pneumonia viral or atypical cannot be ruled out. -Procalcitonin noted at < 0.1 for which pneumonia likely not bacterial. -CT chest also did show patulous debris-filled esophagus which could also entertain the possibility of a component of aspiration pneumonitis- -continue IV Unasyn to complete 5-7 day course of treatment. -Continue PPI.  3.  Oral thrush -Patient still complains of abdominal pain.   -Oral thrush improving.   -Continue Magic mouthwash with lidocaine and nystatin swish and swallow.   4.  Chronic hyperbilirubinemia/transaminitis -Per GI patient with elevated bilirubin for several months. -Likely congestive hepatopathy. -Continue diuresis. -Per GI.  5.  Congestive hepatopathy/underlying NASH cirrhosis/portal hypertension with ascites, varices/thrombocytopenia/coagulopathy -Patient with a MELD score of 23, child C. -Per GI.  6.  Acute kidney injury on chronic kidney disease stage IIIa -On admission patient was noted to have a creatinine of 2.9 with concern for cardiorenal syndrome. -Renal function improved currently resolved. -Patient started back on diuretics of Demadex and Aldactone per primary team. -Monitor renal function with diuresis. -Follow.  7.  Debility PT/OT.  Patient for SNF.  8.  Chronic thrombocytopenia  9.  Chronic systolic heart failure/coronary artery disease status post CABG 2001 with subsequent PCI -2D echo EF 35 to 40% with mild RV dysfunction. -Continue Demadex, Aldactone, Toprol-XL.   -Per  primary team.  10.   Paroxysmal atrial fibrillation CHA2DS2-VASc score 6 -Toprol-XL for rate control.  Not a anticoagulation candidate due to increased risk of bleed in the setting of cirrhosis, esophageal varices, thrombocytopenia.   -Per primary team.    11.  Complete heart block/status post PPM -Per primary team.  12.  Hypomagnesemia Magnesium sulfate 4 g IV x1.  13.  Goals of care Palliative care consulted and following.  Patient currently a DNR with plans to transfer to SNF with palliative care following and if patient continues to decline then likely subsequently home with hospice.  Nothing further to add at this time.  Will sign off.   DVT prophylaxis: SCDs Code Status: DNR Family Communication: Updated patient.  No family at bedside. Disposition:   Status is: Inpatient    Dispo: The patient is from: Home              Anticipated d/c is to: SNF with palliative care following              Patient currently being managed per primary team.   Difficult to place patient no       Consultants:   [facility] 05/10/2020  Palliative care: Dr. Rowe Pavy 05/13/2020  Gastroenterology: Dr. Tarri Glenn 05/16/2020  Procedures:   CT chest 05/10/2020  Chest x-ray 05/10/2020, 05/11/2020, 05/12/2020  2D echo 05/09/2020  Right IJ temporary pacemaker insertion per Dr. Ellyn Hack 05/09/2020  Dual-chamber PPM implantation per Dr. Lysbeth Galas 05/11/2020  Antimicrobials:   IV Unasyn 05/13/2020>>>>>   Subjective: Patient sitting up in bed.  More alert.  Complaining of diffuse pain all over.  States still with mouth pain.  No chest pain.  No shortness of breath.  Stated had to have a bowel movement.   Objective: Vitals:   05/18/20 0030 05/18/20 0458 05/18/20 0523 05/18/20 0913  BP: (!) 94/44 (!) 92/42 (!) 104/44   Pulse: 82 74  72  Resp: 18 10  18   Temp: 98.3 F (36.8 C) 98.9 F (37.2 C)  99.3 F (37.4 C)  TempSrc: Oral Oral  Oral  SpO2: 100% 94%  94%  Weight:  80.5 kg    Height:        Intake/Output  Summary (Last 24 hours) at 05/18/2020 0940 Last data filed at 05/17/2020 2100 Gross per 24 hour  Intake 240 ml  Output --  Net 240 ml   Filed Weights   05/14/20 0500 05/17/20 0454 05/18/20 0458  Weight: 81.8 kg 79.1 kg 80.5 kg    Examination:  General exam: More alert Oropharynx: Oral thrush improved. Respiratory system: CTA B.  No wheezes, no crackles, no rhonchi.  Normal respiratory effort.   Cardiovascular system: RRR.  1-2+ bilateral lower extremity edema.   Gastrointestinal system: Abdomen is soft, nondistended, nontender, positive bowel sounds.  No rebound.  No guarding.  Central nervous system: Alert and oriented. No focal neurological deficits.  Asterixis. Extremities: Symmetric 5 x 5 power.  Asterixis. Skin: Ecchymosis noted on lower extremities. Psychiatry: Judgement and insight appear fair. Mood & affect appropriate.     Data Reviewed: I have personally reviewed following labs and imaging studies  CBC: Recent Labs  Lab 05/14/20 0343 05/15/20 0043 05/16/20 0230 05/17/20 0204 05/18/20 0433  WBC 6.0 4.4 4.5 6.2 5.4  NEUTROABS  --   --   --   --  3.2  HGB 9.9* 10.1* 9.9* 9.8* 11.8*  HCT 29.2* 29.7* 30.1* 29.4* 35.2*  MCV 92.7 92.5 95.0 94.8 94.4  PLT 54* 44*  44* 56* 44*    Basic Metabolic Panel: Recent Labs  Lab 05/14/20 0051 05/15/20 0043 05/16/20 0230 05/17/20 0204 05/18/20 0433  NA 134* 135 139 136 135  K 3.9 3.4* 4.8 4.1 3.7  CL 99 101 105 102 102  CO2 25 24 26 26 25   GLUCOSE 99 181* 106* 78 83  BUN 38* 27* 18 21 23   CREATININE 1.51* 1.08* 0.98 1.09* 1.28*  CALCIUM 9.0 8.8* 9.0 8.9 8.9  MG  --   --   --   --  1.6*    GFR: Estimated Creatinine Clearance: 39.6 mL/min (A) (by C-G formula based on SCr of 1.28 mg/dL (H)).  Liver Function Tests: Recent Labs  Lab 05/12/20 0401 05/16/20 1135 05/18/20 0433  AST 45* 65* 58*  ALT 21 25 27   ALKPHOS 90 90 99  BILITOT 6.1* 9.1* 8.7*  PROT 6.0* 5.7* 6.0*  ALBUMIN 2.9* 2.4* 2.6*    CBG: Recent  Labs  Lab 05/17/20 2122 05/18/20 0740  GLUCAP 99 95     Recent Results (from the past 240 hour(s))  Resp Panel by RT-PCR (Flu A&B, Covid) Nasopharyngeal Swab     Status: None   Collection Time: 05/09/20  1:05 PM   Specimen: Nasopharyngeal Swab; Nasopharyngeal(NP) swabs in vial transport medium  Result Value Ref Range Status   SARS Coronavirus 2 by RT PCR NEGATIVE NEGATIVE Final    Comment: (NOTE) SARS-CoV-2 target nucleic acids are NOT DETECTED.  The SARS-CoV-2 RNA is generally detectable in upper respiratory specimens during the acute phase of infection. The lowest concentration of SARS-CoV-2 viral copies this assay can detect is 138 copies/mL. A negative result does not preclude SARS-Cov-2 infection and should not be used as the sole basis for treatment or other patient management decisions. A negative result may occur with  improper specimen collection/handling, submission of specimen other than nasopharyngeal swab, presence of viral mutation(s) within the areas targeted by this assay, and inadequate number of viral copies(<138 copies/mL). A negative result must be combined with clinical observations, patient history, and epidemiological information. The expected result is Negative.  Fact Sheet for Patients:  EntrepreneurPulse.com.au  Fact Sheet for Healthcare Providers:  IncredibleEmployment.be  This test is no t yet approved or cleared by the [place] FDA and  has been authorized for detection and/or diagnosis of SARS-CoV-2 by FDA under an Emergency Use Authorization (EUA). This EUA will remain  in effect (meaning this test can be used) for the duration of the COVID-19 declaration under Section 564(b)(1) of the Act, 21 U.S.C.section 360bbb-3(b)(1), unless the authorization is terminated  or revoked sooner.       Influenza A by PCR NEGATIVE NEGATIVE Final   Influenza B by PCR NEGATIVE NEGATIVE Final    Comment: (NOTE) The  Xpert Xpress SARS-CoV-2/FLU/RSV plus assay is intended as an aid in the diagnosis of influenza from Nasopharyngeal swab specimens and should not be used as a sole basis for treatment. Nasal washings and aspirates are unacceptable for Xpert Xpress SARS-CoV-2/FLU/RSV testing.  Fact Sheet for Patients: EntrepreneurPulse.com.au  Fact Sheet for Healthcare Providers: IncredibleEmployment.be  This test is not yet approved or cleared by the [place] FDA and has been authorized for detection and/or diagnosis of SARS-CoV-2 by FDA under an Emergency Use Authorization (EUA). This EUA will remain in effect (meaning this test can be used) for the duration of the COVID-19 declaration under Section 564(b)(1) of the Act, 21 U.S.C. section 360bbb-3(b)(1), unless the authorization is terminated or revoked.  Performed at  [facility], [address], [place], [place] [address]   MRSA PCR Screening     Status: None   Collection Time: 05/09/20  2:31 PM   Specimen: Nasopharyngeal  Result Value Ref Range Status   MRSA by PCR NEGATIVE NEGATIVE Final    Comment:        The GeneXpert MRSA Assay (FDA approved for NASAL specimens only), is one component of a comprehensive MRSA colonization surveillance program. It is not intended to diagnose MRSA infection nor to guide or monitor treatment for MRSA infections. Performed at [facility], [place] [address], [place], [place] [address]   Surgical PCR screen     Status: None   Collection Time: 05/10/20 10:14 AM   Specimen: Nasal Mucosa; Nasal Swab  Result Value Ref Range Status   MRSA, PCR NEGATIVE NEGATIVE Final   Staphylococcus aureus NEGATIVE NEGATIVE Final    Comment: (NOTE) The Xpert SA Assay (FDA approved for NASAL specimens in patients 81 years of age and older), is one component of a comprehensive surveillance program. It is not intended to diagnose infection nor to guide or monitor  treatment. Performed at [facility], [place] [address], [place], [place] [address]   SARS CORONAVIRUS 2 (TAT 6-24 HRS) Nasopharyngeal Nasopharyngeal Swab     Status: None   Collection Time: 05/15/20  1:48 PM   Specimen: Nasopharyngeal Swab  Result Value Ref Range Status   SARS Coronavirus 2 NEGATIVE NEGATIVE Final    Comment: (NOTE) SARS-CoV-2 target nucleic acids are NOT DETECTED.  The SARS-CoV-2 RNA is generally detectable in upper and lower respiratory specimens during the acute phase of infection. Negative results do not preclude SARS-CoV-2 infection, do not rule out co-infections with other pathogens, and should not be used as the sole basis for treatment or other patient management decisions. Negative results must be combined with clinical observations, patient history, and epidemiological information. The expected result is Negative.  Fact Sheet for Patients: SugarRoll.be  Fact Sheet for Healthcare Providers: https://www.woods-mathews.com/  This test is not yet approved or cleared by the [place] FDA and  has been authorized for detection and/or diagnosis of SARS-CoV-2 by FDA under an Emergency Use Authorization (EUA). This EUA will remain  in effect (meaning this test can be used) for the duration of the COVID-19 declaration under Se ction 564(b)(1) of the Act, 21 U.S.C. section 360bbb-3(b)(1), unless the authorization is terminated or revoked sooner.  Performed at [facility], [place] [address] [place], [place] [address]          Radiology Studies: No results found.      Scheduled Meds: . Chlorhexidine Gluconate Cloth  6 each Topical Daily  . DULoxetine  20 mg Oral QHS  . ezetimibe  10 mg Oral QHS  . guaiFENesin  600 mg Oral BID  . lactulose  20 g Oral TID  . lidocaine  1 patch Transdermal Q24H  . magic mouthwash w/lidocaine  10 mL Oral QID  . methocarbamol  500 mg Oral TID  . metoprolol  succinate  25 mg Oral Daily  . nystatin  5 mL Oral BID  . oxyCODONE  20 mg Oral Q12H  . pantoprazole  40 mg Oral Daily  . rifaximin  550 mg Oral BID  . senna-docusate  2 tablet Oral QHS  . spironolactone  12.5 mg Oral Daily  . torsemide  20 mg Oral Daily   Continuous Infusions: . magnesium sulfate bolus IVPB  LOS: 9 days    Time spent: 35 minutes    Irine Seal, MD [facility]   To contact the attending provider between 7A-7P or the covering provider during after hours 7P-7A, please log into the web site [email or web address] and access using universal [place] password for that web site. If you do not have the password, please call the hospital operator.  05/18/2020, 9:40 AM

## 2020-05-18 NOTE — Progress Notes (Addendum)
Progress Note  Patient Name: Samantha [name] Date of Encounter: 05/18/2020  [facility] Cardiologist: Sherren Mocha, MD   Subjective   Denies any CP or SOB. Did not eat much breakfast this morning.   Inpatient Medications    Scheduled Meds: . Chlorhexidine Gluconate Cloth  6 each Topical Daily  . DULoxetine  20 mg Oral QHS  . ezetimibe  10 mg Oral QHS  . guaiFENesin  600 mg Oral BID  . lactulose  20 g Oral TID  . lidocaine  1 patch Transdermal Q24H  . magic mouthwash w/lidocaine  10 mL Oral QID  . methocarbamol  500 mg Oral TID  . metoprolol succinate  25 mg Oral Daily  . nystatin  5 mL Oral BID  . oxyCODONE  20 mg Oral Q12H  . pantoprazole  40 mg Oral Daily  . rifaximin  550 mg Oral BID  . senna-docusate  2 tablet Oral QHS  . spironolactone  12.5 mg Oral Daily  . torsemide  20 mg Oral Daily   Continuous Infusions: . magnesium sulfate bolus IVPB     PRN Meds: albuterol, ipratropium, nitroGLYCERIN, ondansetron, oxyCODONE, pneumococcal 23 valent vaccine   Vital Signs    Vitals:   05/17/20 2102 05/18/20 0030 05/18/20 0458 05/18/20 0523  BP: 105/75 (!) 94/44 (!) 92/42 (!) 104/44  Pulse: 75 82 74   Resp: 20 18 10    Temp: 98.6 F (37 C) 98.3 F (36.8 C) 98.9 F (37.2 C)   TempSrc: Oral Oral Oral   SpO2: 92% 100% 94%   Weight:   80.5 kg   Height:        Intake/Output Summary (Last 24 hours) at 05/18/2020 0830 Last data filed at 05/17/2020 2100 Gross per 24 hour  Intake 520 ml  Output -  Net 520 ml   Last 3 Weights 05/18/2020 05/17/2020 05/14/2020  Weight (lbs) 177 lb 7.5 oz 174 lb 6.1 oz 180 lb 5.4 oz  Weight (kg) 80.5 kg 79.1 kg 81.8 kg      Telemetry    NSR without significant ventricular ectopy - Personally Reviewed  ECG    NSR with LBBB - Personally Reviewed  Physical Exam   GEN: No acute distress.   Neck: No JVD Cardiac: RRR, no murmurs, rubs, or gallops.  Respiratory: Clear to auscultation bilaterally. GI: Soft, nontender, non-distended   MS: No edema; No deformity. Neuro:  Nonfocal  Psych: Normal affect   Labs    High Sensitivity Troponin:   Recent Labs  Lab 05/09/20 1307 05/09/20 1506  TROPONINIHS 81* 86*      Chemistry Recent Labs  Lab 05/12/20 0401 05/13/20 0032 05/16/20 0230 05/16/20 1135 05/17/20 0204 05/18/20 0433  NA 135   < > 139  --  136 135  K 4.1   < > 4.8  --  4.1 3.7  CL 100   < > 105  --  102 102  CO2 27   < > 26  --  26 25  GLUCOSE 91   < > 106*  --  78 83  BUN 37*   < > 18  --  21 23  CREATININE 1.83*   < > 0.98  --  1.09* 1.28*  CALCIUM 8.9   < > 9.0  --  8.9 8.9  PROT 6.0*  --   --  5.7*  --  6.0*  ALBUMIN 2.9*  --   --  2.4*  --  2.6*  AST 45*  --   --  65*  --  58*  ALT 21  --   --  25  --  27  ALKPHOS 90  --   --  90  --  99  BILITOT 6.1*  --   --  9.1*  --  8.7*  GFRNONAA 29*   < > >60  --  54* 45*  ANIONGAP 8   < > 8  --  8 8   < > = values in this interval not displayed.     Hematology Recent Labs  Lab 05/16/20 0230 05/17/20 0204 05/18/20 0433  WBC 4.5 6.2 5.4  RBC 3.17* 3.10* 3.73*  HGB 9.9* 9.8* 11.8*  HCT 30.1* 29.4* 35.2*  MCV 95.0 94.8 94.4  MCH 31.2 31.6 31.6  MCHC 32.9 33.3 33.5  RDW 19.4* 19.9* 19.9*  PLT 44* 56* 44*    BNPNo results for input(s): BNP, PROBNP in the last 168 hours.   DDimer No results for input(s): DDIMER in the last 168 hours.   Radiology    No results found.  Cardiac Studies   Echo 05/09/2020 1. Left ventricular ejection fraction, by estimation, is 35 to 40%. The  left ventricle has moderately decreased function. The left ventricle  demonstrates global hypokinesis and RWMA as noted in findings. There is  mild left ventricular hypertrophy. Left  ventricular diastolic parameters are indeterminate.  2. Respiratory related septal shift.  3. Right ventricular systolic function is mildly reduced. The right  ventricular size is normal. There is moderately elevated pulmonary artery  systolic pressure. The estimated right  ventricular systolic pressure is  41.6 mmHg.  4. Left atrial size was moderately dilated.  5. Right atrial size was moderately dilated.  6. Diastolic and systolic mitral valve regurgitation.. The mitral valve  is grossly normal. Mild to moderate mitral valve regurgitation.  7. Tricuspid valve regurgitation is moderate.  8. The aortic valve is grossly normal. There is mild calcification of the  aortic valve. Aortic valve regurgitation is not visualized. No aortic  stenosis is present.  9. The inferior vena cava is dilated in size with <50% respiratory  variability, suggesting right atrial pressure of 15 mmHg.    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  Upon insertion of the sheath, the patient went into AI VR with rates of the 110to 120s -> at that point I decided to forego right heart catheterization and just place the temporary pacemaker. => Temporary pacer used to overdrive pace out of AI VR. 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  RECOMMENDATIONS  Return to nursing unit, continue on dopamine wean as tolerated for blood pressures.  Patient is now essentially pacemaker dependent-would need backup transcutaneous pacing in place if TPM becomes unstable  We will need formal EP evaluation to determine timing of PPM placement.   PPM insertion 05/11/2020  PROCEDURES:    1. Dual chamber permanent pacemaker implantation  CONCLUSIONS:   1. Complete heart block  2. Successful dual chamber permanent pacemaker implantation  3.  No early apparent complications.   Patient Profile     70 y.o. female with PMH of multivessel CAD s/p CABG 3845, nonalcoholic cirrhosis with esophageal varices, chronic systolic CHF< HTN, HLD, afib not on AC due to cirrhosis and varices, SSS, COPD on home O2 presented to [facility] with an  episode of unresponsiveness and found to be  significantly bradycardia in the field requiring atropine. On arrival to [facility], he reported had episode of accelerated idioventricular rhythm with 10s pause and subsequent CHB. She was subsequently transferred to [facility] and underwent dual chamber PPM.   Assessment & Plan    1. Chronic systolic CHF  - Echo 6/81/2751 EF 35-40%, RVSP 52.9 mmHg , moderate LAE, mild to moderate MR.   - continue torsemide 61m daily and spironolactone. Although there is weight gain during this admission, patient appears to be compensated.   - Cr increased this morning, however she was on higher dose of diuretic prior to her hospitalization admission in Feb (based on SStacytelephone note on 04/20/2020, she was on torsemide 475mBID prior to Feb admission). Currently on torsemide 2075maily and spironolactone.   - Cr 1.09 --> 1.28, BP borderline. Will need repeat CBC and BMET either tomorrow if patient is still here, or in 5 days if she is discharged. (note hemoglobin trend also does not make much sense, Hgb jumped from 9.8 to 11.8 overnight)  [facility] and possibly transition to hosp[facility].   2. CHB with accelerated idioventricular rhythm: s/p [employer] dual-chamber PPM on 3/14 by Dr. LamQuentin Ore. PAF: CHA2DS2-Vasc score of 6. Not on AC due to cirrhosis and varices.  4. CAD s/p CABG 2001: no angina. Not on antiplatelet therapy given h/o thrombocytopenia  5. DNR with plan to transition to SNF with palliative care and potential hospice  6. Nonalcoholic cirrhosis with esophageal varices: followed by GI  7. Acute metabolic encephalopathy: GI consulted, lactulose increased, started on xifaxan.   8. Possible pneumonia: followed by hospitalist service, on antibiotics  9. Elevated bilirubin: both direct and indirect bilirubin are elevated. GI on board. Felt to be due to congestive hepatopathy   For questions or updates,  please contact CHMPiffardease consult [email or web address] for contact info under        Signed, HaoAlmyra DeforestA [place]/21/2022, 8:30 AM    Patient seen and examined with HaoAlmyra Deforest.  Agree as above, with the following exceptions and changes as noted below. Gen: Confused, encephalopathic. CV: RRR, no murmurs, Lungs: clear, Abd: soft, Extrem: Warm, Neuro/Psych: unable to assess. All available labs, radiology testing, previous records reviewed. Very challenging situation, she appears not to be improving. Will have palliative care see again. I'm in touch with GI regarding lactulose induced diarrhea and best management in a palliative situation. Discussed her care in detail with sister in law at bedside.   GayElouise MunroeD 05/18/20 1:17 PM  ADDENDUM: reviewed indications for flexiseal rectal tube placement given copious diarrhea. I am concerned about risk for bleeding from rectum and breakdown. Would use this only for short term management, we may want to review quickly with palliative care whether there are continued indications for lactulose and therefore help decide on chronicity of flexiseal. Nurse Poonam and I discussed in length, both agreed will use in short term and discontinue quickly with any signs of bleeding or rectal breakdown.

## 2020-05-18 NOTE — Progress Notes (Signed)
Daily Progress Note   Patient Name: Samantha [name]       Date: 05/18/2020 DOB: [birth year]  Age: 70 y.o. MRN#: [id number] Attending Physician: Elouise Munroe, MD Primary Care Physician: [name], MD Admit Date: 05/09/2020  Reason for Consultation/Follow-up: Establishing goals of care  Subjective: Increasing confusion and lethargy over the weekend. Hepatic function worsening, elevated ammonia. Significant diarrhea due to lactulose. Not eating. BP's soft. Hallucinating. Cr is starting to trend back up. Samantha [name] has drastically declined over the weekend.  Met with her brother and her sister in law. They are concerned she is dying and I agree. We discussed option for transitioning to full comfort measures only and allowing for natural dying process with symptom management. Family asked about hospice options- she is like a residential hospice candidate- they would wish for her to transfer to [facility] in [place].  ROS  Length of Stay: 9  Current Medications: Scheduled Meds:  . Chlorhexidine Gluconate Cloth  6 each Topical Daily  . DULoxetine  20 mg Oral QHS  . ezetimibe  10 mg Oral QHS  . guaiFENesin  600 mg Oral BID  . lactulose  20 g Oral TID  . lidocaine  1 patch Transdermal Q24H  . magic mouthwash w/lidocaine  10 mL Oral QID  . methocarbamol  500 mg Oral TID  . metoprolol succinate  25 mg Oral Daily  . nystatin  5 mL Oral BID  . oxyCODONE  20 mg Oral Q12H  . pantoprazole  40 mg Oral Daily  . rifaximin  550 mg Oral BID  . senna-docusate  2 tablet Oral QHS  . spironolactone  12.5 mg Oral Daily  . torsemide  20 mg Oral Daily    Continuous Infusions:   PRN Meds: albuterol, ipratropium, nitroGLYCERIN, ondansetron, oxyCODONE, pneumococcal 23 valent  vaccine  Physical Exam Vitals and nursing note reviewed.  Constitutional:      Appearance: She is ill-appearing.  Pulmonary:     Effort: Pulmonary effort is normal.  Skin:    Coloration: Skin is jaundiced.  Neurological:     Comments: lehtargic             Vital Signs: BP (!) 104/44 (BP Location: Right Arm)   Pulse 72   Temp 99.3 F (37.4 C) (Oral)   Resp  18   Ht _0  (1.575 m)   Wt 80.5 kg   LMP 03/29/2011   SpO2 94%   BMI 32.46 kg/m  SpO2: SpO2: 94 % O2 Device: O2 Device: Nasal Cannula O2 Flow Rate: O2 Flow Rate (L/min): 2 L/min  Intake/output summary:   Intake/Output Summary (Last 24 hours) at 05/18/2020 1416 Last data filed at 05/17/2020 2100 Gross per 24 hour  Intake 240 ml  Output --  Net 240 ml   LBM: Last BM Date: 05/17/20 Baseline Weight: Weight: 73.9 kg Most recent weight: Weight: 80.5 kg       Palliative Assessment/Data: PPS: 20%      Patient Active Problem List   Diagnosis Date Noted  . Oral thrush   . Acute metabolic encephalopathy   . Hyperbilirubinemia   . Debility   . AKI (acute kidney injury) ([place]) 05/16/2020  . Chronic systolic congestive heart failure ([place])   . Demand ischemia ([place])   . S/P placement of cardiac pacemaker   . 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 Symptomatic bradycardia 05/09/2020  . Heart block AV complete ([place]) 05/09/2020  . Acute on chronic combined systolic and diastolic CHF (congestive heart failure) ([place]) 05/09/2020  . Paroxysmal ventricular tachycardia ([place]) - related to Bradycardia  05/09/2020  . L1 and L 2--Lumbar compression fracture, closed, initial encounter ([place]) 04/14/2020  . Chronic respiratory failure with hypoxia -CHF  04/13/2020  . Compression fracture of L2 lumbar vertebra ([place]) 04/12/2020  . Cholestasis, intrahepatic 03/12/2020  . Pneumonia 02/18/2020  . Hepatic cirrhosis ([place])   . Hypokalemia   . Anasarca 02/14/2020  . Cirrhosis of liver with ascites ([place]) 02/14/2020  . Volume  overload 02/13/2020  . Constipation 02/11/2020  . Peripheral edema 02/03/2020  . Thrombocytopenia ([place]) 01/24/2020  . Left leg swelling 01/24/2020  . Hyperammonemia ([place]) 01/24/2020  . Prolonged QT interval 01/24/2020  . Elevated troponin 01/24/2020  . Intractable nausea and vomiting 01/23/2020  . Atypical chest pain 08/06/2019  . GERD (gastroesophageal reflux disease) 08/06/2019  . Acute respiratory failure with hypoxia ([place]) 05/23/2019  . Abdominal pain 05/20/2019  . Intractable abdominal pain 05/19/2019  . Paroxysmal atrial fibrillation ([place]) 05/19/2019  . Anxiety with depression 05/19/2019  . Sinus arrhythmia 05/19/2019  . Iron deficiency anemia 02/05/2019  . Chronic combined systolic and diastolic CHF (congestive heart failure) ([place]) 04/17/2018  . Heart failure ([place]) 04/17/2018  . Osteoarthritis of spine with radiculopathy, cervical region 01/12/2018  . Paresthesia of both hands 12/14/2017  . Recurrent falls 12/14/2017  . Closed compression fracture of first lumbar vertebra ([place]) 08/23/2017  . Lumbar spondylosis with myelopathy 08/23/2017  . Carpal tunnel syndrome of right wrist 04/05/2017  . Rectal pain 03/06/2017  . Influenza A (H1N1) 02/22/2017  . Rectal bleeding 02/22/2017  . Closed fracture of lower end of right radius with routine healing 02/20/2017  . Injury of triangular fibrocartilage complex of right wrist 02/20/2017  . Pain 02/10/2017  . Other cirrhosis of liver ([place]) 10/26/2016  . Persistent atrial fibrillation 08/16/2016  . Cardiomyopathy, ischemic-35-40% by cath  01/11/2015  . Obesity 09/24/2013  . Unspecified hereditary and idiopathic peripheral neuropathy 01/10/2013  . Hereditary and idiopathic peripheral neuropathy 01/10/2013  . Cirrhosis, non-alcoholic ([place]) 92/33/0076  . Esophageal varices- Plavix stopped 12/23/2011  . GI bleed 12/23/2011  . Idiopathic esophageal varices without bleeding ([place]) 12/23/2011  . Anemia 04/22/2011  . Coronary artery  disease involving native coronary artery of native heart with angina pectoris ([place]) 08/21/2009  . Mixed hyperlipidemia 06/04/2008  .  Essential hypertension 06/04/2008  . S/P CABG x 3- 2001 06/04/2008    Palliative Care Assessment & Plan   Patient Profile:  70 y.o. female  with past medical history of multivessel CAD status post CABG 6394, nonalcoholic hepatic cirrhosis with the chart for GU varices and thrombocytopenia, HFrEF with EF 35%, hypertension, hyperlipidemia, permanent atrial fibrillation not on anticoagulation, COPD on home oxygen admitted on 05/09/2020 with symptomatic bradycardia found to have complete heart block.  Patient was transferred to the ICU on dopamine drip and had a pacemaker placed.  Patient currently off of dopamine drip and will be transferred to a stepdown unit.  Palliative team is consulted for goal of care conversation.  Assessment/Recommendations/Plan   Plan for transition to full comfort measures only  TOC referral for inpatient Hospice placement  Goals of Care and Additional Recommendations:  Limitations on Scope of Treatment: Full Comfort Care  Code Status:  DNR  Prognosis:   < 2 weeks  Discharge Planning:  Hospice facility  Care plan was discussed with patient's brother and sister in law.   Thank you for allowing the Palliative Medicine Team to assist in the care of this patient.   Total time: 62 mins  Greater than 50%  of this time was spent counseling and coordinating care related to the above assessment and plan.  Mariana Kaufman, AGNP-C Palliative Medicine   Please contact Palliative Medicine Team phone at [phone] for questions and concerns.

## 2020-05-18 NOTE — Consult Note (Signed)
   [facility] Inpatient Consult   05/18/2020  Samantha [name] 19-Mar-1950 [id number]  [place] Organization [ACO] Patient: [name] Medicare  Primary Care Provider: [name], MD  Patient screened for hospitalization with noted extreme high risk score for unplanned readmission risk and length of stay to assess for potential [place] Management service needs for post hospital transition.  Review of patient's medical record reveals patient is currently awaiting a skilled nursing facility for transition of care.   Plan:  Continue to follow progress and disposition to assess for post hospital care management needs.  If patient transitions to SNF her post hospital needs are to be met at the SNF level of care.  For questions contact:   Natividad Brood, RN BSN [facility] Liaison  [phone] business mobile phone Toll free office [phone]  Fax number: [phone] [email or web address]

## 2020-05-18 NOTE — TOC Progression Note (Signed)
Transition of Care [facility]) - Progression Note    Patient Details  Name: Samantha [name] MRN: [id number] Date of Birth: Sep 15, 1950  Transition of Care [facility]) CM/SW [place], [place] Phone Number: 05/18/2020, 5:01 PM  Clinical Narrative:     CSW received consult for residential hospice for patient. CSW called Cassandra with hospice of [facility] and made referral for patient. Larey Seat confirmed there is no bed availability today. Vito Backers will follow up with CSW in the morning on referral status.  CSW will continue to follow.    Expected Discharge Plan: [place] Barriers to Discharge: Continued Medical Work up  Expected Discharge Plan and Services Expected Discharge Plan: [place] In-house Referral: Clinical Social Work     Living arrangements for the past 2 months: Single Family Home                                       Social Determinants of Health (SDOH) Interventions    Readmission Risk Interventions Readmission Risk Prevention Plan 05/13/2020 02/19/2020  PCP or Specialist Appt within 3-5 Days - Complete  SW Recovery Care/Counseling Consult Complete -  [place] Complete -  Some recent data might be hidden

## 2020-05-18 NOTE — Plan of Care (Signed)

## 2020-05-18 NOTE — Care Management Important Message (Signed)
Important Message  Patient Details  Name: Samantha [name] MRN: [id number] Date of Birth: [birth year]   Medicare Important Message Given:  Yes     Shelda Altes 05/18/2020, 11:50 AM

## 2020-05-19 ENCOUNTER — Ambulatory Visit (INDEPENDENT_AMBULATORY_CARE_PROVIDER_SITE_OTHER): Payer: Medicare Other | Admitting: Internal Medicine

## 2020-05-19 DIAGNOSIS — I509 Heart failure, unspecified: Secondary | ICD-10-CM | POA: Diagnosis not present

## 2020-05-19 DIAGNOSIS — K7200 Acute and subacute hepatic failure without coma: Secondary | ICD-10-CM

## 2020-05-19 DIAGNOSIS — Z7189 Other specified counseling: Secondary | ICD-10-CM | POA: Diagnosis not present

## 2020-05-19 DIAGNOSIS — I442 Atrioventricular block, complete: Secondary | ICD-10-CM | POA: Diagnosis not present

## 2020-05-19 DIAGNOSIS — I255 Ischemic cardiomyopathy: Secondary | ICD-10-CM | POA: Diagnosis not present

## 2020-05-19 LAB — RESP PANEL BY RT-PCR (FLU A&B, COVID) ARPGX2
Influenza A by PCR: NEGATIVE
Influenza B by PCR: NEGATIVE
SARS Coronavirus 2 by RT PCR: NEGATIVE

## 2020-05-19 MED ORDER — TORSEMIDE 20 MG PO TABS
20.0000 mg | ORAL_TABLET | Freq: Every day | ORAL | 0 refills | Status: AC
Start: 2020-05-19 — End: ?

## 2020-05-19 MED ORDER — LORAZEPAM 2 MG PO TABS: 2.0000 mg | ORAL_TABLET | ORAL | Status: AC | PRN

## 2020-05-19 MED ORDER — MAGIC MOUTHWASH W/LIDOCAINE: 10.0000 mL | Freq: Four times a day (QID) | ORAL | Status: AC

## 2020-05-19 NOTE — Progress Notes (Signed)
Report given to Beola Cord at [facility] [place]. [phone]

## 2020-05-19 NOTE — TOC Transition Note (Signed)
Transition of Care [facility]) - CM/SW Discharge Note   Patient Details  Name: Samantha [name] MRN: [id number] Date of Birth: 1950-09-24  [facility] of [facility]) CM/SW Contact:  Trula Ore, [place] Phone Number: 05/19/2020, 4:59 PM   Clinical Narrative:     Patient will DC [id number] [place]   Anticipated DC date: 05/19/2020  Family notified: Ulice Dash  Transport by: Corey Harold  ?  Per MD patient ready for [facility] to [facility] . RN, patient, patient's family, and facility notified of DC. [name] RN given number for report [phone]. DC packet on chart.DNR signed by MD attached to DC packet. Ambulance transport requested for patient.  CSW signing off.    Final next level of care: [place] Barriers to Discharge: No Barriers Identified   Patient Goals and CMS Choice Patient states their goals for this hospitalization and ongoing recovery are:: to go to SNF CMS Medicare.gov Compare Post Acute Care list provided to:: Patient Represenative (must comment) Ulice Dash patients son) Choice offered to / list presented to : Adult Children (Son Ulice Dash)  Discharge Placement              Patient [facility] bed at:  [facility]) Patient to be transferred to facility by: [place] Name of family member notified: Ulice Dash Patient and family notified of of transfer: 05/19/20  Discharge Plan and Services In-house Referral: Clinical Social Work                                   Social Determinants of Health (SDOH) Interventions     Readmission Risk Interventions Readmission Risk Prevention Plan 05/13/2020 02/19/2020  PCP or Specialist Appt within 3-5 Days - Complete  SW Recovery Care/Counseling Consult Complete -  [place] Complete -  Some recent data might be hidden

## 2020-05-19 NOTE — Progress Notes (Signed)
Daily Progress Note   Patient Name: Samantha [name]       Date: 05/19/2020 DOB: [birth year]  Age: 70 y.o. MRN#: [id number] Attending Physician: Elouise Munroe, MD Primary Care Physician: [name], MD Admit Date: 05/09/2020  Reason for Consultation/Follow-up: Establishing goals of care  Subjective: Patient in bed. Tells me she is going to heaven- maybe 2 days, maybe 4 days. She is currently in [facility] in [employer] building- says she is confused about how she got up last night.  Reports there were many people in her room last night and she couldn't sleep.  Her legs are heavy and hurt.  She wants a cold coke and a roast beef sandwich.  Received call from her sister in law re: financial power of attorney paperwork. Discussed this with Samantha [name]. Samantha [name] said she was trying to get that completed before she was admitted- she wishes for her brother to be her financial power of attorney and definitely not her sister. She understands what the financial power of attorney means. Noted plan is for her to discharge to hospice house when bed is available.   Review of Systems  Cardiovascular: Positive for leg swelling.  Psychiatric/Behavioral: Positive for hallucinations. The patient has insomnia.     Length of Stay: 10  Current Medications: Scheduled Meds:  . DULoxetine  20 mg Oral QHS  . guaiFENesin  600 mg Oral BID  . lidocaine  1 patch Transdermal Q24H  . magic mouthwash w/lidocaine  10 mL Oral QID  . methocarbamol  500 mg Oral TID  . nystatin  5 mL Oral BID  . oxyCODONE  20 mg Oral Q12H  . pantoprazole  40 mg Oral Daily  . torsemide  20 mg Oral Daily    Continuous Infusions:   PRN Meds: albuterol, ipratropium, LORazepam **OR** LORazepam **OR** LORazepam, nitroGLYCERIN,  ondansetron, oxyCODONE  Physical Exam Vitals and nursing note reviewed.  Cardiovascular:     Comments: Significant lower extremity edema Pulmonary:     Effort: Pulmonary effort is normal.  Musculoskeletal:        General: Swelling present.  Skin:    Coloration: Skin is jaundiced.  Neurological:     Comments: Oriented to person, not to place             Vital Signs: BP [name])  111/43 (BP Location: Right Arm)   Pulse 70   Temp 98.4 F (36.9 C) (Oral)   Resp 14   Ht 5' 2"  (1.575 m)   Wt 80.5 kg   LMP 03/29/2011   SpO2 97%   BMI 32.46 kg/m  SpO2: SpO2: 97 % O2 Device: O2 Device: Nasal Cannula O2 Flow Rate: O2 Flow Rate (L/min): 2 L/min  Intake/output summary:   Intake/Output Summary (Last 24 hours) at 05/19/2020 1042 Last data filed at 05/18/2020 1442 Gross per 24 hour  Intake 103.39 ml  Output --  Net 103.39 ml   LBM: Last BM Date: 05/18/20 Baseline Weight: Weight: 73.9 kg Most recent weight: Weight: 80.5 kg       Palliative Assessment/Data: PPS: 20%      Patient Active Problem List   Diagnosis Date Noted  . Oral thrush   . Acute metabolic encephalopathy   . Hyperbilirubinemia   . Debility   . AKI (acute kidney injury) ([place]) 05/16/2020  . Chronic systolic congestive heart failure ([place])   . Demand ischemia ([place])   . S/P placement of cardiac pacemaker   . 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 Symptomatic bradycardia 05/09/2020  . Heart block AV complete ([place]) 05/09/2020  . Acute on chronic combined systolic and diastolic CHF (congestive heart failure) ([place]) 05/09/2020  . Paroxysmal ventricular tachycardia ([place]) - related to Bradycardia  05/09/2020  . L1 and L 2--Lumbar compression fracture, closed, initial encounter ([place]) 04/14/2020  . Chronic respiratory failure with hypoxia -CHF  04/13/2020  . Compression fracture of L2 lumbar vertebra ([place]) 04/12/2020  . Cholestasis, intrahepatic 03/12/2020  . Pneumonia 02/18/2020  . Hepatic cirrhosis ([place])   .  Hypokalemia   . Anasarca 02/14/2020  . Cirrhosis of liver with ascites ([place]) 02/14/2020  . Volume overload 02/13/2020  . Constipation 02/11/2020  . Peripheral edema 02/03/2020  . Thrombocytopenia ([place]) 01/24/2020  . Left leg swelling 01/24/2020  . Hyperammonemia ([place]) 01/24/2020  . Prolonged QT interval 01/24/2020  . Elevated troponin 01/24/2020  . Intractable nausea and vomiting 01/23/2020  . Atypical chest pain 08/06/2019  . GERD (gastroesophageal reflux disease) 08/06/2019  . Acute respiratory failure with hypoxia ([place]) 05/23/2019  . Abdominal pain 05/20/2019  . Intractable abdominal pain 05/19/2019  . Paroxysmal atrial fibrillation ([place]) 05/19/2019  . Anxiety with depression 05/19/2019  . Sinus arrhythmia 05/19/2019  . Iron deficiency anemia 02/05/2019  . Chronic combined systolic and diastolic CHF (congestive heart failure) ([place]) 04/17/2018  . Heart failure ([place]) 04/17/2018  . Osteoarthritis of spine with radiculopathy, cervical region 01/12/2018  . Paresthesia of both hands 12/14/2017  . Recurrent falls 12/14/2017  . Closed compression fracture of first lumbar vertebra ([place]) 08/23/2017  . Lumbar spondylosis with myelopathy 08/23/2017  . Carpal tunnel syndrome of right wrist 04/05/2017  . Rectal pain 03/06/2017  . Influenza A (H1N1) 02/22/2017  . Rectal bleeding 02/22/2017  . Closed fracture of lower end of right radius with routine healing 02/20/2017  . Injury of triangular fibrocartilage complex of right wrist 02/20/2017  . Pain 02/10/2017  . Other cirrhosis of liver ([place]) 10/26/2016  . Persistent atrial fibrillation 08/16/2016  . Cardiomyopathy, ischemic-35-40% by cath  01/11/2015  . Obesity 09/24/2013  . Unspecified hereditary and idiopathic peripheral neuropathy 01/10/2013  . Hereditary and idiopathic peripheral neuropathy 01/10/2013  . Cirrhosis, non-alcoholic ([place]) 54/00/8676  . Esophageal varices- Plavix stopped 12/23/2011  . GI bleed 12/23/2011  . Idiopathic  esophageal varices without bleeding ([place]) 12/23/2011  . Anemia 04/22/2011  . Coronary artery  disease involving native coronary artery of native heart with angina pectoris ([place]) 08/21/2009  . Mixed hyperlipidemia 06/04/2008  . Essential hypertension 06/04/2008  . S/P CABG x 3- 2001 06/04/2008    Palliative Care Assessment & Plan   Patient Profile: 70 y.o.femalewith past medical history of multivessel CAD status post CABG 2001,nonalcoholic hepatic cirrhosis with the chart for GU varices and thrombocytopenia,HFrEF with EF 35%, hypertension, hyperlipidemia, permanent atrial fibrillation not on anticoagulation, COPD on home oxygenadmitted on 3/12/2022with symptomatic bradycardia found to have complete heart block. Patient was transferred to the ICU on dopamine drip and had a pacemaker placed. Patient currently off of dopamine drip and will be transferred to a stepdown unit. Palliative team is consulted for goal of care conversation.  Assessment/Recommendations/Plan   Full comfort care only- no further labs or interventions that are not primarily for comfort  Hospital cannot assist with financial power of attorney- but patient does have capacity at this time- however, her capacity waxes and wanes  Plan for transfer to inpatient hospice when bed is available- patient needs symptom management for pain and hallucinations  Sister in law Samantha [name] is bringing a roast beef sandwich from [employer]  Goals of Care and Additional Recommendations:  Limitations on Scope of Treatment: Full Comfort Care  Code Status:  DNR  Prognosis:   < 2 weeks  Discharge Planning:  [place] was discussed with patient and her sister-in-lawQuita [name].  Thank you for allowing the Palliative Medicine Team to assist in the care of this patient.   Total time: 39 mins Greater than 50%  of this time was spent counseling and coordinating care related to the above assessment and plan.  Samantha [name], AGNP-C Palliative Medicine   Please contact Palliative Medicine Team phone at [phone] for questions and concerns.

## 2020-05-19 NOTE — Progress Notes (Addendum)
Progress Note  Patient Name: Samantha [name] Date of Encounter: 05/19/2020  [facility] Cardiologist: Sherren Mocha, MD   Subjective   Denies any CP or significant dyspnea. Did not sleep last night. Aware that she will be going to a hospice facility once bed is made available.   Inpatient Medications    Scheduled Meds: . DULoxetine  20 mg Oral QHS  . guaiFENesin  600 mg Oral BID  . lidocaine  1 patch Transdermal Q24H  . magic mouthwash w/lidocaine  10 mL Oral QID  . methocarbamol  500 mg Oral TID  . nystatin  5 mL Oral BID  . oxyCODONE  20 mg Oral Q12H  . pantoprazole  40 mg Oral Daily  . torsemide  20 mg Oral Daily   Continuous Infusions:  PRN Meds: albuterol, ipratropium, LORazepam **OR** LORazepam **OR** LORazepam, nitroGLYCERIN, ondansetron, oxyCODONE   Vital Signs    Vitals:   05/18/20 0523 05/18/20 0913 05/18/20 1529 05/18/20 2024  BP: (!) 104/44  (!) 109/43 (!) 111/43  Pulse:  72 70   Resp:  18 18 14   Temp:  99.3 F (37.4 C) 99.1 F (37.3 C) 98.4 F (36.9 C)  TempSrc:  Oral Axillary Oral  SpO2:  94% 93% 97%  Weight:      Height:        Intake/Output Summary (Last 24 hours) at 05/19/2020 0817 Last data filed at 05/18/2020 1442 Gross per 24 hour  Intake 103.39 ml  Output -  Net 103.39 ml   Last 3 Weights 05/18/2020 05/17/2020 05/14/2020  Weight (lbs) 177 lb 7.5 oz 174 lb 6.1 oz 180 lb 5.4 oz  Weight (kg) 80.5 kg 79.1 kg 81.8 kg      Telemetry    NSR frequent PVCs - Personally Reviewed  ECG    NSR with LBBB - Personally Reviewed  Physical Exam   GEN: slightly confused. Weak Neck: No JVD Cardiac: RRR, no rubs, or gallops. 2/6 murmur Respiratory: anterior exam clear to auscultation bilaterally. GI: Soft, nontender, non-distended  MS: No edema; No deformity. Neuro: weak, unable to do full neuro exam Psych: flat affect   Labs    High Sensitivity Troponin:   Recent Labs  Lab 05/09/20 1307 05/09/20 1506  TROPONINIHS 81* 86*       Chemistry Recent Labs  Lab 05/16/20 0230 05/16/20 1135 05/17/20 0204 05/18/20 0433  NA 139  --  136 135  K 4.8  --  4.1 3.7  CL 105  --  102 102  CO2 26  --  26 25  GLUCOSE 106*  --  78 83  BUN 18  --  21 23  CREATININE 0.98  --  1.09* 1.28*  CALCIUM 9.0  --  8.9 8.9  PROT  --  5.7*  --  6.0*  ALBUMIN  --  2.4*  --  2.6*  AST  --  65*  --  58*  ALT  --  25  --  27  ALKPHOS  --  90  --  99  BILITOT  --  9.1*  --  8.7*  GFRNONAA >60  --  54* 45*  ANIONGAP 8  --  8 8     Hematology Recent Labs  Lab 05/16/20 0230 05/17/20 0204 05/18/20 0433  WBC 4.5 6.2 5.4  RBC 3.17* 3.10* 3.73*  HGB 9.9* 9.8* 11.8*  HCT 30.1* 29.4* 35.2*  MCV 95.0 94.8 94.4  MCH 31.2 31.6 31.6  MCHC 32.9 33.3 33.5  RDW  19.4* 19.9* 19.9*  PLT 44* 56* 44*    BNPNo results for input(s): BNP, PROBNP in the last 168 hours.   DDimer No results for input(s): DDIMER in the last 168 hours.   Radiology    No results found.  Cardiac Studies   Echo 05/09/2020 1. Left ventricular ejection fraction, by estimation, is 35 to 40%. The  left ventricle has moderately decreased function. The left ventricle  demonstrates global hypokinesis and RWMA as noted in findings. There is  mild left ventricular hypertrophy. Left  ventricular diastolic parameters are indeterminate.  2. Respiratory related septal shift.  3. Right ventricular systolic function is mildly reduced. The right  ventricular size is normal. There is moderately elevated pulmonary artery  systolic pressure. The estimated right ventricular systolic pressure is  32.6 mmHg.  4. Left atrial size was moderately dilated.  5. Right atrial size was moderately dilated.  6. Diastolic and systolic mitral valve regurgitation.. The mitral valve  is grossly normal. Mild to moderate mitral valve regurgitation.  7. Tricuspid valve regurgitation is moderate.  8. The aortic valve is grossly normal. There is mild calcification of the  aortic valve.  Aortic valve regurgitation is not visualized. No aortic  stenosis is present.  9. The inferior vena cava is dilated in size with <50% respiratory  variability, suggesting right atrial pressure of 15 mmHg.    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  Upon insertion of the sheath, the patient went into AI VR with rates of the 110to 120s -> at that point I decided to forego right heart catheterization and just place the temporary pacemaker. => Temporary pacer used to overdrive pace out of AI VR. 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  RECOMMENDATIONS  Return to nursing unit, continue on dopamine wean as tolerated for blood pressures.  Patient is now essentially pacemaker dependent-would need backup transcutaneous pacing in place if TPM becomes unstable  We will need formal EP evaluation to determine timing of PPM placement.   PPM insertion 05/11/2020 PROCEDURES:  1. Dual chamber permanent pacemaker implantation  CONCLUSIONS:  1. Complete heart block 2. Successful dual chamber permanent pacemaker implantation 3. No early apparent complications.   Patient Profile     70 y.o. female with PMH of multivessel CAD s/p CABG 7124, nonalcoholic cirrhosis with esophageal varices, chronic systolic CHF< HTN, HLD, afib not on AC due to cirrhosis and varices, SSS, COPD on home O2 presented to [facility] with an episode of unresponsiveness and found to be significantly bradycardia in the field requiring atropine. On arrival to [facility], he reported had episode of accelerated idioventricular rhythm with 10s pause and subsequent CHB. She was subsequently transferred to [facility] and underwent dual chamber PPM.   Assessment & Plan    1. Chronic systolic CHF             - Echo 05/09/2020 EF 35-40%, RVSP 52.9 mmHg ,  moderate LAE, mild to moderate MR.              - continue torsemide 13m daily and spironolactone. Although there is weight gain during this admission, patient appears to be compensated.              - Cr increased on 3/21, however she was on higher dose of diuretic prior to her hospitalization admission in Feb (  based on [employer] telephone note on 04/20/2020, she was on torsemide 51m BID prior to Feb admission). Currently on torsemide 275mdaily. Spironolactone discontinued yesterday by palliative care. BP slightly better this morning.              - pending repeat CMP and CBC this morning.              - seen by palliative care service yesterday to discuss goal of care again, recommend discharge to hospice facility. Patient is waiting for bed availability at [facility]   2. CHB with accelerated idioventricular rhythm: s/p [employer] dual-chamber PPM on 3/14 by Dr. LaQuentin Ore3. PAF: CHA2DS2-Vasc score of 6. Not on AC due to cirrhosis and varices.  4. CAD s/p CABG 2001: no angina. Not on antiplatelet therapy given h/o thrombocytopenia  5. DNR: seen by palliative care service, plan to D/C to [facility]. Waiting for bed availability.  6. Nonalcoholic cirrhosis with esophageal varices: followed by GI  7. Acute metabolic encephalopathy: GI consulted, lactulose increased, started on xifaxan.   8. Possible pneumonia: followed by hospitalist service, on antibiotics  9. Elevated bilirubin: both direct and indirect bilirubin are elevated. GI on board. Felt to be due to congestive hepatopathy   For questions or updates, please contact [facility] consult [email or web address] for contact info under        Signed, HaAlmyra DeforestPAGrace City3/22/2022, 8:17 AM    Patient seen and examined with HaAlmyra DeforestA.  Agree as above, with the following exceptions and changes as noted below.  Patient is clear today and tells me that she is having mild discomfort in her chest abdomen and  back.  She feels hydromorphone is the most helpful.  [place]. Gen: NAD, skin: Diffusely jaundiced, CV: RRR, systolic murmurs, Lungs: clear, Abd: soft, Extrem: Warm, Neuro/Psych: alert, normal mood and affect. All available labs, radiology testing, previous records reviewed.  Plan for transition to hospice cares.  She understands based on our conversation today and feels this is an appropriate plan of care.  Sister-in-law was not at the bedside for today's visit.  GaElouise MunroeMD 05/19/20 11:15 AM

## 2020-05-19 NOTE — Progress Notes (Signed)
Patient was discharged off the floor via stretcher by [facility] staff at 2255.  Patient's glasses were on her face upon discharge and  Pacemaker box and information was transported with the patient.      Donah Driver, RN

## 2020-05-20 ENCOUNTER — Telehealth (INDEPENDENT_AMBULATORY_CARE_PROVIDER_SITE_OTHER): Payer: Self-pay | Admitting: Internal Medicine

## 2020-05-20 NOTE — Telephone Encounter (Signed)
Patients daughter in law left message stating the patient is now under the care of [facility] - wanted to let Dr Laural Golden know

## 2020-05-20 NOTE — Telephone Encounter (Signed)
Dr. Laural Golden  will be made aware.

## 2020-05-21 ENCOUNTER — Telehealth: Payer: Self-pay

## 2020-05-21 ENCOUNTER — Ambulatory Visit: Payer: Medicare Other

## 2020-05-21 NOTE — Telephone Encounter (Signed)
Patient sister-in law calling to cancel wound check appt today (DPR on file).    Samantha [name] reports pt on Hospice care, they have informed her death is imminent within 7 hours.  Advised that if pt condition changes then they need to reschedule appt for wound check/ suture or steristrip removal.

## 2020-05-27 DIAGNOSIS — D696 Thrombocytopenia, unspecified: Secondary | ICD-10-CM | POA: Diagnosis not present

## 2020-05-27 DIAGNOSIS — E7220 Disorder of urea cycle metabolism, unspecified: Secondary | ICD-10-CM | POA: Diagnosis not present

## 2020-05-29 DEATH — deceased

## 2020-06-23 NOTE — Telephone Encounter (Signed)
error 

## 2020-08-14 ENCOUNTER — Encounter: Payer: Medicare Other | Admitting: Cardiology
